# Patient Record
Sex: Female | Born: 1980 | Race: Black or African American | Hispanic: No | Marital: Single | Smoking: Current some day smoker
Health system: Southern US, Community
[De-identification: ages and names within clinical notes are randomized; demographics above are authoritative.]

## PROBLEM LIST (undated history)

## (undated) DIAGNOSIS — G629 Polyneuropathy, unspecified: Secondary | ICD-10-CM

## (undated) DIAGNOSIS — R51 Headache: Secondary | ICD-10-CM

## (undated) DIAGNOSIS — I509 Heart failure, unspecified: Secondary | ICD-10-CM

## (undated) DIAGNOSIS — F419 Anxiety disorder, unspecified: Secondary | ICD-10-CM

## (undated) DIAGNOSIS — D571 Sickle-cell disease without crisis: Secondary | ICD-10-CM

## (undated) DIAGNOSIS — I10 Essential (primary) hypertension: Secondary | ICD-10-CM

## (undated) DIAGNOSIS — J45909 Unspecified asthma, uncomplicated: Secondary | ICD-10-CM

## (undated) DIAGNOSIS — D649 Anemia, unspecified: Secondary | ICD-10-CM

## (undated) DIAGNOSIS — J302 Other seasonal allergic rhinitis: Secondary | ICD-10-CM

## (undated) DIAGNOSIS — I2510 Atherosclerotic heart disease of native coronary artery without angina pectoris: Secondary | ICD-10-CM

## (undated) DIAGNOSIS — K603 Anal fistula, unspecified: Secondary | ICD-10-CM

## (undated) DIAGNOSIS — K760 Fatty (change of) liver, not elsewhere classified: Secondary | ICD-10-CM

## (undated) DIAGNOSIS — K810 Acute cholecystitis: Secondary | ICD-10-CM

## (undated) DIAGNOSIS — G4730 Sleep apnea, unspecified: Secondary | ICD-10-CM

## (undated) DIAGNOSIS — T8859XA Other complications of anesthesia, initial encounter: Secondary | ICD-10-CM

## (undated) DIAGNOSIS — E010 Iodine-deficiency related diffuse (endemic) goiter: Secondary | ICD-10-CM

## (undated) DIAGNOSIS — G4733 Obstructive sleep apnea (adult) (pediatric): Secondary | ICD-10-CM

## (undated) DIAGNOSIS — R519 Headache, unspecified: Secondary | ICD-10-CM

## (undated) DIAGNOSIS — B009 Herpesviral infection, unspecified: Secondary | ICD-10-CM

## (undated) DIAGNOSIS — T4145XA Adverse effect of unspecified anesthetic, initial encounter: Secondary | ICD-10-CM

## (undated) DIAGNOSIS — K5732 Diverticulitis of large intestine without perforation or abscess without bleeding: Secondary | ICD-10-CM

## (undated) HISTORY — DX: Anemia, unspecified: D64.9

---

## 1999-06-10 ENCOUNTER — Emergency Department (HOSPITAL_COMMUNITY): Admission: EM | Admit: 1999-06-10 | Discharge: 1999-06-10 | Payer: Self-pay | Admitting: Emergency Medicine

## 1999-06-10 ENCOUNTER — Encounter: Payer: Self-pay | Admitting: Emergency Medicine

## 1999-06-12 ENCOUNTER — Emergency Department (HOSPITAL_COMMUNITY): Admission: EM | Admit: 1999-06-12 | Discharge: 1999-06-12 | Payer: Self-pay | Admitting: Emergency Medicine

## 2004-01-23 ENCOUNTER — Emergency Department (HOSPITAL_COMMUNITY): Admission: EM | Admit: 2004-01-23 | Discharge: 2004-01-23 | Payer: Self-pay | Admitting: Family Medicine

## 2004-04-26 ENCOUNTER — Encounter: Admission: RE | Admit: 2004-04-26 | Discharge: 2004-04-26 | Payer: Self-pay | Admitting: Family Medicine

## 2004-09-19 ENCOUNTER — Emergency Department (HOSPITAL_COMMUNITY): Admission: EM | Admit: 2004-09-19 | Discharge: 2004-09-20 | Payer: Self-pay

## 2004-10-02 ENCOUNTER — Emergency Department (HOSPITAL_COMMUNITY): Admission: EM | Admit: 2004-10-02 | Discharge: 2004-10-02 | Payer: Self-pay | Admitting: Emergency Medicine

## 2006-01-16 ENCOUNTER — Other Ambulatory Visit: Admission: RE | Admit: 2006-01-16 | Discharge: 2006-01-16 | Payer: Self-pay | Admitting: Gynecology

## 2007-01-21 ENCOUNTER — Other Ambulatory Visit: Admission: RE | Admit: 2007-01-21 | Discharge: 2007-01-21 | Payer: Self-pay | Admitting: Obstetrics and Gynecology

## 2008-02-22 ENCOUNTER — Other Ambulatory Visit: Admission: RE | Admit: 2008-02-22 | Discharge: 2008-02-22 | Payer: Self-pay | Admitting: Obstetrics and Gynecology

## 2008-12-16 ENCOUNTER — Encounter: Admission: RE | Admit: 2008-12-16 | Discharge: 2008-12-16 | Payer: Self-pay | Admitting: Family Medicine

## 2009-03-15 ENCOUNTER — Encounter (INDEPENDENT_AMBULATORY_CARE_PROVIDER_SITE_OTHER): Payer: Self-pay | Admitting: *Deleted

## 2009-03-22 ENCOUNTER — Ambulatory Visit: Payer: Self-pay | Admitting: Family Medicine

## 2009-03-22 DIAGNOSIS — M549 Dorsalgia, unspecified: Secondary | ICD-10-CM | POA: Insufficient documentation

## 2009-03-22 DIAGNOSIS — J45901 Unspecified asthma with (acute) exacerbation: Secondary | ICD-10-CM

## 2009-03-22 DIAGNOSIS — Z6841 Body Mass Index (BMI) 40.0 and over, adult: Secondary | ICD-10-CM

## 2009-03-22 HISTORY — DX: Morbid (severe) obesity due to excess calories: E66.01

## 2009-04-07 ENCOUNTER — Encounter: Payer: Self-pay | Admitting: Family Medicine

## 2009-05-17 ENCOUNTER — Ambulatory Visit: Payer: Self-pay | Admitting: Diagnostic Radiology

## 2009-05-17 ENCOUNTER — Emergency Department (HOSPITAL_BASED_OUTPATIENT_CLINIC_OR_DEPARTMENT_OTHER): Admission: EM | Admit: 2009-05-17 | Discharge: 2009-05-17 | Payer: Self-pay | Admitting: Emergency Medicine

## 2009-06-19 ENCOUNTER — Encounter: Payer: Self-pay | Admitting: Family Medicine

## 2009-06-19 ENCOUNTER — Other Ambulatory Visit: Admission: RE | Admit: 2009-06-19 | Discharge: 2009-06-19 | Payer: Self-pay | Admitting: Family Medicine

## 2009-06-19 ENCOUNTER — Ambulatory Visit: Payer: Self-pay | Admitting: Family Medicine

## 2009-06-20 ENCOUNTER — Telehealth (INDEPENDENT_AMBULATORY_CARE_PROVIDER_SITE_OTHER): Payer: Self-pay | Admitting: *Deleted

## 2009-06-20 LAB — CONVERTED CEMR LAB
ALT: 12 units/L (ref 0–35)
AST: 14 units/L (ref 0–37)
Alkaline Phosphatase: 64 units/L (ref 39–117)
Eosinophils Relative: 2.3 % (ref 0.0–5.0)
GFR calc non Af Amer: 151.96 mL/min (ref >60)
HCT: 34 % — ABNORMAL LOW (ref 36.0–46.0)
Hemoglobin: 11 g/dL — ABNORMAL LOW (ref 12.0–15.0)
LDL Cholesterol: 112 mg/dL — ABNORMAL HIGH (ref 0–99)
Lymphs Abs: 2.2 10*3/uL (ref 0.7–4.0)
Monocytes Relative: 9.2 % (ref 3.0–12.0)
Neutro Abs: 3.5 10*3/uL (ref 1.4–7.7)
Platelets: 392 10*3/uL (ref 150.0–400.0)
Potassium: 4 meq/L (ref 3.5–5.1)
Sodium: 140 meq/L (ref 135–145)
TSH: 1.17 microintl units/mL (ref 0.35–5.50)
Total Bilirubin: 0.6 mg/dL (ref 0.3–1.2)
VLDL: 9.4 mg/dL (ref 0.0–40.0)
WBC: 6.5 10*3/uL (ref 4.5–10.5)

## 2009-06-27 ENCOUNTER — Encounter (INDEPENDENT_AMBULATORY_CARE_PROVIDER_SITE_OTHER): Payer: Self-pay | Admitting: *Deleted

## 2009-07-25 ENCOUNTER — Telehealth (INDEPENDENT_AMBULATORY_CARE_PROVIDER_SITE_OTHER): Payer: Self-pay | Admitting: *Deleted

## 2009-08-03 ENCOUNTER — Telehealth (INDEPENDENT_AMBULATORY_CARE_PROVIDER_SITE_OTHER): Payer: Self-pay | Admitting: *Deleted

## 2009-09-23 HISTORY — PX: OTHER SURGICAL HISTORY: SHX169

## 2010-02-07 ENCOUNTER — Ambulatory Visit: Payer: Self-pay | Admitting: Family Medicine

## 2010-02-07 DIAGNOSIS — N912 Amenorrhea, unspecified: Secondary | ICD-10-CM

## 2010-02-08 ENCOUNTER — Telehealth (INDEPENDENT_AMBULATORY_CARE_PROVIDER_SITE_OTHER): Payer: Self-pay | Admitting: *Deleted

## 2010-02-16 ENCOUNTER — Emergency Department (HOSPITAL_COMMUNITY): Admission: EM | Admit: 2010-02-16 | Discharge: 2010-02-16 | Payer: Self-pay | Admitting: Emergency Medicine

## 2010-02-20 ENCOUNTER — Emergency Department (HOSPITAL_COMMUNITY): Admission: EM | Admit: 2010-02-20 | Discharge: 2010-02-20 | Payer: Self-pay | Admitting: Emergency Medicine

## 2010-03-13 ENCOUNTER — Ambulatory Visit (HOSPITAL_COMMUNITY): Admission: RE | Admit: 2010-03-13 | Discharge: 2010-03-13 | Payer: Self-pay | Admitting: Obstetrics

## 2010-05-11 ENCOUNTER — Ambulatory Visit (HOSPITAL_COMMUNITY): Admission: RE | Admit: 2010-05-11 | Discharge: 2010-05-11 | Payer: Self-pay | Admitting: Obstetrics

## 2010-06-08 ENCOUNTER — Ambulatory Visit (HOSPITAL_COMMUNITY): Admission: RE | Admit: 2010-06-08 | Discharge: 2010-06-08 | Payer: Self-pay | Admitting: Obstetrics

## 2010-06-19 ENCOUNTER — Inpatient Hospital Stay (HOSPITAL_COMMUNITY): Admission: AD | Admit: 2010-06-19 | Discharge: 2010-06-19 | Payer: Self-pay | Admitting: Obstetrics

## 2010-07-11 ENCOUNTER — Encounter: Payer: Self-pay | Admitting: Obstetrics & Gynecology

## 2010-07-11 ENCOUNTER — Inpatient Hospital Stay (HOSPITAL_COMMUNITY): Admission: AD | Admit: 2010-07-11 | Discharge: 2010-07-15 | Payer: Self-pay | Admitting: Obstetrics & Gynecology

## 2010-07-12 ENCOUNTER — Ambulatory Visit: Payer: Self-pay | Admitting: Cardiology

## 2010-07-12 ENCOUNTER — Encounter: Payer: Self-pay | Admitting: Pulmonary Disease

## 2010-07-12 ENCOUNTER — Encounter (INDEPENDENT_AMBULATORY_CARE_PROVIDER_SITE_OTHER): Payer: Self-pay | Admitting: Pulmonary Disease

## 2010-07-12 ENCOUNTER — Ambulatory Visit: Payer: Self-pay | Admitting: Pulmonary Disease

## 2010-07-13 ENCOUNTER — Other Ambulatory Visit: Payer: Self-pay | Admitting: Obstetrics & Gynecology

## 2010-08-02 ENCOUNTER — Telehealth: Payer: Self-pay | Admitting: Pulmonary Disease

## 2010-08-07 ENCOUNTER — Encounter: Payer: Self-pay | Admitting: Pulmonary Disease

## 2010-08-07 ENCOUNTER — Ambulatory Visit (HOSPITAL_BASED_OUTPATIENT_CLINIC_OR_DEPARTMENT_OTHER)
Admission: RE | Admit: 2010-08-07 | Discharge: 2010-08-07 | Payer: Self-pay | Source: Home / Self Care | Admitting: Pulmonary Disease

## 2010-08-10 ENCOUNTER — Ambulatory Visit: Payer: Self-pay | Admitting: Pulmonary Disease

## 2010-10-18 ENCOUNTER — Telehealth (INDEPENDENT_AMBULATORY_CARE_PROVIDER_SITE_OTHER): Payer: Self-pay | Admitting: *Deleted

## 2010-10-23 NOTE — Progress Notes (Signed)
Summary: prenatal vit  Phone Note Refill Request Call back at [phone] Message from:  Pharmacy on Feb 08, 2010 9:24 AM  Refills Requested: Medication #1:  CITRANATAL DHA 27-1 & 250 MG MISC 1 tab daily as directed.   Dosage confirmed as above?Dosage Confirmed   Notes: This med is on backorder till June, what would you like to change it to? [facility] on [address]  Next Appointment Scheduled: seen yesterday Initial call taken by: Elna Breslow,  Feb 08, 2010 9:25 AM  Follow-up for Phone Call        left message on machine ....Marland KitchenMarland KitchenMalachi Bonds  Feb 08, 2010 10:40 AM      Appended Document: prenatal vit    Phone Note Outgoing Call   Call placed by: Malachi Bonds,  Feb 08, 2010 12:12 PM Call placed to: Patient Summary of Call: left message on machine .....Marland KitchenMarland KitchenMalachi Bonds  Feb 08, 2010 12:18 PM   Follow-up for Phone Call        patient to see gyn.......Marland KitchenMalachi Bonds  Feb 14, 2010 2:17 PM

## 2010-10-23 NOTE — Assessment & Plan Note (Signed)
Summary: pregnacy test/cbs   Vital Signs:  Patient profile:   30 year old female LMP:     09/24/1999 Weight:      406 pounds O2 Sat:      98 % on Room air Pulse rate:   119 / minute  Vitals Entered By: Malachi Bonds (Feb 07, 2010 9:35 AM)  O2 Flow:  Room air  History of Present Illness: 30 yo woman here today for possible pregnancy.  never started [facility]- lost script but didn't notify us.  no period since 1/11.  (-) Upreg in Feb.  3 positive home tests in April.  did not call doctor nor start vitamins.  pt wants to keep pregnancy.  Preventive Screening-Counseling & Management  Alcohol-Tobacco     Smoking Status: quit < 6 months  Current Medications (verified): 1)  Proventil Hfa 108 (90 Base) Mcg/act Aers (Albuterol Sulfate) .... Use As Needed 2)  Advair Diskus 100-50 Mcg/dose Misc (Fluticasone-Salmeterol) .... Use Daily 3)  Ferrous Sulfate 325 (65 Fe) Mg Tabs (Ferrous Sulfate) .... Take One Tablet Two Times A Day 4)  Citranatal Dha 27-1 & 250 Mg Misc (Prenat W/o A-Fecbgl-Dss-Fa-Dha) .Marland Kitchen.. 1 Tab Daily As Directed  Allergies (verified): 1)  ! Penicillin  Past History:  Past Medical History: Last updated: 03/22/2009 Asthma Obesity  Social History: Last updated: 03/22/2009 lives w/ sister in a relationship, feels safe customer service for [employer]  Social History: Smoking Status:  quit < 6 months  Review of Systems      See HPI  Physical Exam  General:  morbidly obese Psych:  Cognition and judgment appear intact. Alert and cooperative with normal attention span and concentration. No apparent delusions, illusions, hallucinations   Impression & Recommendations:  Problem # 1:  AMENORRHEA (ICD-626.0) Assessment New pt informed of her + pregnancy test.  refer to OB for determination of gestational age and to set up prenatal care.  start vitamins.  reviewed list of things to avoid in pregnancy.  pt's questions answered. The following medications were removed from the  medication list:    Seasonique 0.15-0.03 &0.01 Mg Tabs (Levonorgest-eth estrad 91-day) .Marland Kitchen... 1 tab daily as directed.  disp 1 pack.  Orders: Obstetric Referral (Obstetric) Urine Pregnancy Test  (23762)  Complete Medication List: 1)  Proventil Hfa 108 (90 Base) Mcg/act Aers (Albuterol sulfate) .... Use as needed 2)  Advair Diskus 100-50 Mcg/dose Misc (Fluticasone-salmeterol) .... Use daily 3)  Ferrous Sulfate 325 (65 Fe) Mg Tabs (Ferrous sulfate) .... Take one tablet two times a day 4)  Citranatal Dha 27-1 & 250 Mg Misc (Prenat w/o a-fecbgl-dss-fa-dha) .Marland Kitchen.. 1 tab daily as directed  Patient Instructions: 1)  Take your prenatal vitamin daily 2)  AVOID alcohol, ibuprofen, aspirin, sushi, lunch meats 3)  Someone will call you with your OB appt 4)  Call with any questions or concerns 5)  CONGRATS!!! Prescriptions: PROVENTIL HFA 108 (90 BASE) MCG/ACT AERS (ALBUTEROL SULFATE) use as needed  #1 x 3   Entered and Authorized by:   Annye Asa MD   Signed by:   Annye Asa MD on 02/07/2010   Method used:   Electronically to        [facility]. [phone]* (retail)       [address] [place], [place]  [address]       Ph: [phone]       Fax: [phone]   RxID:   [id number] [place] DHA 27-1 & 250 [place] (  PRENAT W/O A-FECBGL-DSS-FA-DHA) 1 tab daily as directed  #90 x 3   Entered and Authorized by:   Annye Asa MD   Signed by:   Annye Asa MD on 02/07/2010   Method used:   Electronically to        [facility]. [phone]* (retail)       [address] [place], [place]  [address]       Ph: [phone]       Fax: [phone]   RxID:   [id number]   Laboratory Results   Urine Tests      Urine HCG: positive

## 2010-10-23 NOTE — Progress Notes (Signed)
Summary: nos appt  Phone Note Call from Patient   Caller: [email or web address]  Call For: alva Summary of Call: LMTCB x2 to rsc nos from 11/9. Initial call taken by: Netta Neat,  August 02, 2010 3:09 PM

## 2010-10-25 NOTE — Progress Notes (Signed)
Summary: refill  Phone Note Refill Request Message from:  Fax from Pharmacy on October 18, 2010 9:36 AM  Refills Requested: Medication #1:  VENTOLIN HFA INH W/DOS CTR 200 PUFFS USE AS DIRECTED [facility] [address] - fax [phone] - phone [phone]  Initial call taken by: Arbie [place],  October 18, 2010 9:40 AM    New/Updated Medications: VENTOLIN HFA 108 (90 BASE) MCG/ACT AERS (ALBUTEROL SULFATE) 2 puffs every 4 to 6 hours as needed Prescriptions: VENTOLIN HFA 108 (90 BASE) MCG/ACT AERS (ALBUTEROL SULFATE) 2 puffs every 4 to 6 hours as needed  #1 x 3   Entered by:   Malachi Bonds CMA   Authorized by:   Annye Asa MD   Signed by:   Malachi Bonds CMA on 10/18/2010   Method used:   Electronically to        [facility] (retail)       [address]       [place], [place]  [address]       Ph: [phone]       Fax: [phone]   RxID:   [id number]

## 2010-12-05 LAB — POCT I-STAT 3, ART BLOOD GAS (G3+)
Acid-Base Excess: 4 mmol/L — ABNORMAL HIGH (ref 0.0–2.0)
Acid-Base Excess: 7 mmol/L — ABNORMAL HIGH (ref 0.0–2.0)
Bicarbonate: 29.7 mEq/L — ABNORMAL HIGH (ref 20.0–24.0)
O2 Saturation: 91 %
O2 Saturation: 95 %
O2 Saturation: 96 %
O2 Saturation: 98 %
Patient temperature: 98.4
TCO2: 31 mmol/L (ref 0–100)
TCO2: 33 mmol/L (ref 0–100)
pCO2 arterial: 51.9 mmHg — ABNORMAL HIGH (ref 35.0–45.0)
pCO2 arterial: 67.3 mmHg (ref 35.0–45.0)
pCO2 arterial: 67.3 mmHg (ref 35.0–45.0)
pH, Arterial: 7.251 — ABNORMAL LOW (ref 7.350–7.400)
pH, Arterial: 7.268 — ABNORMAL LOW (ref 7.350–7.400)
pO2, Arterial: 105 mmHg — ABNORMAL HIGH (ref 80.0–100.0)
pO2, Arterial: 65 mmHg — ABNORMAL LOW (ref 80.0–100.0)
pO2, Arterial: 90 mmHg (ref 80.0–100.0)

## 2010-12-05 LAB — URINALYSIS, ROUTINE W REFLEX MICROSCOPIC
Glucose, UA: NEGATIVE mg/dL
Glucose, UA: NEGATIVE mg/dL
Ketones, ur: NEGATIVE mg/dL
Nitrite: NEGATIVE
Protein, ur: NEGATIVE mg/dL
Specific Gravity, Urine: 1.015 (ref 1.005–1.030)
Urobilinogen, UA: 0.2 mg/dL (ref 0.0–1.0)
pH: 7.5 (ref 5.0–8.0)

## 2010-12-05 LAB — BLOOD GAS, ARTERIAL
Acid-base deficit: 0.6 mmol/L (ref 0.0–2.0)
Acid-base deficit: 1.8 mmol/L (ref 0.0–2.0)
Bicarbonate: 26.1 mEq/L — ABNORMAL HIGH (ref 20.0–24.0)
Bicarbonate: 29.5 mEq/L — ABNORMAL HIGH (ref 20.0–24.0)
Delivery systems: POSITIVE
Drawn by: 30803
Drawn by: 308031
Drawn by: 308031
Expiratory PAP: 6
Expiratory PAP: 6
FIO2: 0.4 %
Inspiratory PAP: 16
Inspiratory PAP: 18
Inspiratory PAP: 18
O2 Content: 1 L/min
O2 Saturation: 100 %
O2 Saturation: 100 %
RATE: 30 resp/min
TCO2: 28.1 mmol/L (ref 0–100)
pCO2 arterial: 77.4 mmHg (ref 35.0–45.0)
pH, Arterial: 7.007 — CL (ref 7.350–7.400)
pH, Arterial: 7.166 — CL (ref 7.350–7.400)
pO2, Arterial: 108 mmHg — ABNORMAL HIGH (ref 80.0–100.0)
pO2, Arterial: 125 mmHg — ABNORMAL HIGH (ref 80.0–100.0)
pO2, Arterial: 188 mmHg — ABNORMAL HIGH (ref 80.0–100.0)

## 2010-12-05 LAB — BASIC METABOLIC PANEL
BUN: 3 mg/dL — ABNORMAL LOW (ref 6–23)
BUN: 4 mg/dL — ABNORMAL LOW (ref 6–23)
CO2: 29 mEq/L (ref 19–32)
CO2: 29 mEq/L (ref 19–32)
Calcium: 7.8 mg/dL — ABNORMAL LOW (ref 8.4–10.5)
Chloride: 104 mEq/L (ref 96–112)
Creatinine, Ser: 0.55 mg/dL (ref 0.4–1.2)
Creatinine, Ser: 0.64 mg/dL (ref 0.4–1.2)
GFR calc Af Amer: >60 mL/min (ref >60)
GFR calc non Af Amer: >60 mL/min (ref >60)
GFR calc non Af Amer: >60 mL/min (ref >60)
Glucose, Bld: 76 mg/dL (ref 70–99)
Glucose, Bld: 95 mg/dL (ref 70–99)
Potassium: 5 mEq/L (ref 3.5–5.1)
Potassium: 5 mEq/L (ref 3.5–5.1)
Potassium: 5.1 mEq/L (ref 3.5–5.1)
Sodium: 137 mEq/L (ref 135–145)

## 2010-12-05 LAB — URINE CULTURE
Colony Count: NO GROWTH
Culture  Setup Time: 201110200910
Culture: NO GROWTH

## 2010-12-05 LAB — CBC
HCT: 24.9 % — ABNORMAL LOW (ref 36.0–46.0)
HCT: 28.6 % — ABNORMAL LOW (ref 36.0–46.0)
Hemoglobin: 7.9 g/dL — ABNORMAL LOW (ref 12.0–15.0)
Hemoglobin: 8.7 g/dL — ABNORMAL LOW (ref 12.0–15.0)
MCHC: 28.9 g/dL — ABNORMAL LOW (ref 30.0–36.0)
MCV: 67.2 fL — ABNORMAL LOW (ref 78.0–100.0)
Platelets: 332 10*3/uL (ref 150–400)
Platelets: 354 10*3/uL (ref 150–400)
Platelets: 366 10*3/uL (ref 150–400)
RBC: 4.08 MIL/uL (ref 3.87–5.11)
RDW: 18.5 % — ABNORMAL HIGH (ref 11.5–15.5)
RDW: 19.3 % — ABNORMAL HIGH (ref 11.5–15.5)
RDW: 19.4 % — ABNORMAL HIGH (ref 11.5–15.5)
WBC: 12.1 10*3/uL — ABNORMAL HIGH (ref 4.0–10.5)
WBC: 16.4 10*3/uL — ABNORMAL HIGH (ref 4.0–10.5)
WBC: 9.9 10*3/uL (ref 4.0–10.5)

## 2010-12-05 LAB — CARDIAC PANEL(CRET KIN+CKTOT+MB+TROPI)
CK, MB: 1.8 ng/mL (ref 0.3–4.0)
CK, MB: 2.6 ng/mL (ref 0.3–4.0)
Relative Index: 3.4 — ABNORMAL HIGH (ref 0.0–2.5)
Relative Index: INVALID (ref 0.0–2.5)
Relative Index: INVALID (ref 0.0–2.5)
Total CK: 78 U/L (ref 7–177)
Troponin I: <0.01 ng/mL (ref 0.00–0.06)

## 2010-12-05 LAB — MAGNESIUM
Magnesium: 3.4 mg/dL — ABNORMAL HIGH (ref 1.5–2.5)
Magnesium: 3.8 mg/dL — ABNORMAL HIGH (ref 1.5–2.5)
Magnesium: 3.9 mg/dL — ABNORMAL HIGH (ref 1.5–2.5)
Magnesium: 3.9 mg/dL — ABNORMAL HIGH (ref 1.5–2.5)
Magnesium: 4.1 mg/dL — ABNORMAL HIGH (ref 1.5–2.5)
Magnesium: 4.5 mg/dL — ABNORMAL HIGH (ref 1.5–2.5)

## 2010-12-05 LAB — URINE MICROSCOPIC-ADD ON

## 2010-12-05 LAB — PHOSPHORUS
Phosphorus: 4.7 mg/dL — ABNORMAL HIGH (ref 2.3–4.6)
Phosphorus: 5.1 mg/dL — ABNORMAL HIGH (ref 2.3–4.6)

## 2010-12-05 LAB — COMPREHENSIVE METABOLIC PANEL
Albumin: 2.2 g/dL — ABNORMAL LOW (ref 3.5–5.2)
Alkaline Phosphatase: 111 U/L (ref 39–117)
BUN: 4 mg/dL — ABNORMAL LOW (ref 6–23)
Potassium: 4.1 mEq/L (ref 3.5–5.1)
Sodium: 135 mEq/L (ref 135–145)
Total Protein: 6.3 g/dL (ref 6.0–8.3)

## 2010-12-05 LAB — URIC ACID: Uric Acid, Serum: 6.4 mg/dL (ref 2.4–7.0)

## 2010-12-05 LAB — CULTURE, BLOOD (ROUTINE X 2)
Culture  Setup Time: 201110201110
Culture: NO GROWTH

## 2010-12-05 LAB — MRSA PCR SCREENING: MRSA by PCR: NEGATIVE

## 2010-12-05 LAB — GLUCOSE, CAPILLARY
Glucose-Capillary: 107 mg/dL — ABNORMAL HIGH (ref 70–99)
Glucose-Capillary: 110 mg/dL — ABNORMAL HIGH (ref 70–99)
Glucose-Capillary: 120 mg/dL — ABNORMAL HIGH (ref 70–99)
Glucose-Capillary: 82 mg/dL (ref 70–99)

## 2010-12-05 LAB — BRAIN NATRIURETIC PEPTIDE: Pro B Natriuretic peptide (BNP): 112 pg/mL — ABNORMAL HIGH (ref 0.0–100.0)

## 2010-12-05 LAB — PROTIME-INR
INR: 1.17 (ref 0.00–1.49)
Prothrombin Time: 15.1 seconds (ref 11.6–15.2)

## 2010-12-05 LAB — STREP PNEUMONIAE URINARY ANTIGEN: Strep Pneumo Urinary Antigen: NEGATIVE

## 2010-12-05 LAB — HEMOGLOBIN A1C: Hgb A1c MFr Bld: 5.5 % (ref <5.7)

## 2010-12-06 LAB — COMPREHENSIVE METABOLIC PANEL
ALT: 10 U/L (ref 0–35)
AST: 13 U/L (ref 0–37)
Albumin: 2.3 g/dL — ABNORMAL LOW (ref 3.5–5.2)
CO2: 28 mEq/L (ref 19–32)
Calcium: 8.9 mg/dL (ref 8.4–10.5)
Creatinine, Ser: 0.42 mg/dL (ref 0.4–1.2)
GFR calc Af Amer: >60 mL/min (ref >60)
GFR calc non Af Amer: >60 mL/min (ref >60)
Sodium: 138 mEq/L (ref 135–145)
Total Protein: 6.5 g/dL (ref 6.0–8.3)

## 2010-12-06 LAB — CBC
Hemoglobin: 8.5 g/dL — ABNORMAL LOW (ref 12.0–15.0)
MCH: 21.3 pg — ABNORMAL LOW (ref 26.0–34.0)
MCHC: 31.4 g/dL (ref 30.0–36.0)
Platelets: 423 10*3/uL — ABNORMAL HIGH (ref 150–400)
RDW: 18.5 % — ABNORMAL HIGH (ref 11.5–15.5)

## 2010-12-10 LAB — URINE MICROSCOPIC-ADD ON

## 2010-12-10 LAB — URINALYSIS, ROUTINE W REFLEX MICROSCOPIC
Bilirubin Urine: NEGATIVE
Glucose, UA: NEGATIVE mg/dL
Glucose, UA: NEGATIVE mg/dL
Hgb urine dipstick: NEGATIVE
Hgb urine dipstick: NEGATIVE
Protein, ur: NEGATIVE mg/dL
Protein, ur: NEGATIVE mg/dL
Specific Gravity, Urine: 1.018 (ref 1.005–1.030)
pH: 7.5 (ref 5.0–8.0)

## 2010-12-10 LAB — POCT PREGNANCY, URINE: Preg Test, Ur: POSITIVE

## 2010-12-10 LAB — GC/CHLAMYDIA PROBE AMP, GENITAL: GC Probe Amp, Genital: NEGATIVE

## 2010-12-29 LAB — POCT CARDIAC MARKERS

## 2011-02-08 NOTE — Group Therapy Note (Signed)
[name], [name] NO.:  [id number]   MEDICAL RECORD NO.:  [id number]                   PATIENT TYPE:  OUT   LOCATION:  [facility]                           FACILITY:  [facility]   PHYSICIAN:  Andrew Au, MD                     DATE OF BIRTH:  1981/06/09   DATE OF SERVICE:  04/26/2004                                    CLINIC NOTE   CHIEF COMPLAINT:  Heavy bleeding.   HISTORY OF PRESENT ILLNESS:  Dominique [name] is a 30 year old patient with history  of irregular menstrual bleeding who comes in today with continuous bleeding  x2 months.  LMP began February 22, 2004, continued.  States this happened  previously in 1999 and 2001 relieved with OCPs after 2001, but stopped her  N[place]ette OCP August of 2004.  Has since then irregular q.2-3 month periods  four to five days of regular bleeding.  Had a period in March of 2005 and  again in June of 2005 and has not stopped.  Is having moderate bleeding, not  heavy or light.  Three pads per day, red.  No pain.  States that she is  tired with decreased energy, but not too bad.  She has history of Trich.  No abnormal Pap smears.  Last Pap smear in April of 2005.  Other than  asthma, no surgical or medical history.   OBJECTIVE:  VITAL SIGNS:  Noted.  GENERAL:  This is an obese black female, NAD, pleasant.  CARDIOVASCULAR:  RR.  LUNGS:  CTAB.  ABDOMEN:  Positive BS, soft, NT, ND.  Unable to appreciate fundal height.   ASSESSMENT/PLAN:  A 30 year old morbidly obese patient.  1. Menometrorrhagia likely secondary to polycystic ovarian syndrome.  Will     cycle on birth control pills in order to regulate bleeding cycles.  Will     start Yasmin.  The patient in agreement with this plan.  Also, check CBC     to rule out significant anemia with her recent bleeding.  Will also need     to rule out glucose intolerance with polycystic ovarian syndrome.  Will     schedule for a three-hour GTT.  The patient states she cannot pay for  this until end of August.  Will have a follow-up in three weeks.  Post     test will start empirically on Metformin 500 mg b.i.d. and instructed to     titrate up for gastrointestinal symptoms.  If three-hour GTT is normal     will continue to follow in the clinic.  If abnormal, will encourage     patient to follow up at [facility] or internal medicine physicians.  2. Asthma.  Also given refills albuterol and Advair as patient is from out     of town and does not have a primary physician in this area.     Dominique [name]  Dominique Lai, MD                        Andrew Au, MD    JS/MEDQ  D:  04/26/2004  T:  04/27/2004  Job:  [id number]

## 2011-10-24 ENCOUNTER — Other Ambulatory Visit: Payer: Self-pay | Admitting: Family Medicine

## 2011-10-24 NOTE — Telephone Encounter (Signed)
Pt noted last OV 02-07-2010 tried to contact pt both numbers disconnected in chart, called pharmacy per noted this was incoming fax and advised pt needs to be seen before anymore medications can be given, noted number given by pharmacy [phone] states a different person vm, however left message to notify office if the person in contact was indeed Dominique [name], Letter has been mailed to pt address noted in the chart to advise they are overdue for cpe/ov/labs and the pt needs to contact office to set up appt

## 2013-02-06 ENCOUNTER — Emergency Department (HOSPITAL_COMMUNITY): Payer: Self-pay

## 2013-02-06 ENCOUNTER — Emergency Department (HOSPITAL_COMMUNITY)
Admission: EM | Admit: 2013-02-06 | Discharge: 2013-02-06 | Disposition: A | Discharge status: Home / Self Care | Payer: Self-pay | Attending: Emergency Medicine | Admitting: Emergency Medicine

## 2013-02-06 ENCOUNTER — Encounter (HOSPITAL_COMMUNITY): Payer: Self-pay | Admitting: *Deleted

## 2013-02-06 DIAGNOSIS — R0602 Shortness of breath: Secondary | ICD-10-CM | POA: Insufficient documentation

## 2013-02-06 DIAGNOSIS — Z880 Allergy status to penicillin: Secondary | ICD-10-CM | POA: Insufficient documentation

## 2013-02-06 DIAGNOSIS — E669 Obesity, unspecified: Secondary | ICD-10-CM | POA: Insufficient documentation

## 2013-02-06 DIAGNOSIS — J45901 Unspecified asthma with (acute) exacerbation: Secondary | ICD-10-CM | POA: Insufficient documentation

## 2013-02-06 DIAGNOSIS — Z8669 Personal history of other diseases of the nervous system and sense organs: Secondary | ICD-10-CM | POA: Insufficient documentation

## 2013-02-06 DIAGNOSIS — Z79899 Other long term (current) drug therapy: Secondary | ICD-10-CM | POA: Insufficient documentation

## 2013-02-06 DIAGNOSIS — R110 Nausea: Secondary | ICD-10-CM | POA: Insufficient documentation

## 2013-02-06 DIAGNOSIS — Z3202 Encounter for pregnancy test, result negative: Secondary | ICD-10-CM | POA: Insufficient documentation

## 2013-02-06 HISTORY — DX: Sleep apnea, unspecified: G47.30

## 2013-02-06 HISTORY — DX: Unspecified asthma, uncomplicated: J45.909

## 2013-02-06 LAB — CBC WITH DIFFERENTIAL/PLATELET
Basophils Relative: 1 % (ref 0–1)
Eosinophils Absolute: 0.9 10*3/uL — ABNORMAL HIGH (ref 0.0–0.7)
Eosinophils Relative: 10 % — ABNORMAL HIGH (ref 0–5)
HCT: 32.7 % — ABNORMAL LOW (ref 36.0–46.0)
Hemoglobin: 8.9 g/dL — ABNORMAL LOW (ref 12.0–15.0)
Lymphocytes Relative: 31 % (ref 12–46)
MCH: 16.5 pg — ABNORMAL LOW (ref 26.0–34.0)
MCHC: 27.2 g/dL — ABNORMAL LOW (ref 30.0–36.0)
Neutro Abs: 4.1 10*3/uL (ref 1.7–7.7)
Neutrophils Relative %: 49 % (ref 43–77)
RBC: 5.4 MIL/uL — ABNORMAL HIGH (ref 3.87–5.11)

## 2013-02-06 LAB — COMPREHENSIVE METABOLIC PANEL
ALT: 13 U/L (ref 0–35)
AST: 14 U/L (ref 0–37)
Alkaline Phosphatase: 67 U/L (ref 39–117)
CO2: 29 mEq/L (ref 19–32)
Calcium: 9.3 mg/dL (ref 8.4–10.5)
Chloride: 99 mEq/L (ref 96–112)
GFR calc Af Amer: >90 mL/min (ref >90)
GFR calc non Af Amer: >90 mL/min (ref >90)
Glucose, Bld: 97 mg/dL (ref 70–99)
Sodium: 136 mEq/L (ref 135–145)
Total Bilirubin: 0.4 mg/dL (ref 0.3–1.2)

## 2013-02-06 LAB — D-DIMER, QUANTITATIVE: D-Dimer, Quant: <0.27 ug/mL-FEU (ref 0.00–0.48)

## 2013-02-06 MED ORDER — ALBUTEROL SULFATE (5 MG/ML) 0.5% IN NEBU
5.0000 mg | INHALATION_SOLUTION | Freq: Once | RESPIRATORY_TRACT | Status: AC
  Administered 2013-02-06: 5 mg via RESPIRATORY_TRACT
  Filled 2013-02-06: qty 1

## 2013-02-06 NOTE — ED Notes (Signed)
Pt c/o of mid upper abd pain w/ deep breathing and eating, nausea when eating X 2 weeks. Denies cough.

## 2013-02-06 NOTE — ED Provider Notes (Addendum)
History     CSN: [id number]  Arrival date & time 02/06/13  7829   First MD Initiated Contact with Patient 02/06/13 [phone]      Chief Complaint  Patient presents with  . Shortness of Breath    (Consider location/radiation/quality/duration/timing/severity/associated sxs/prior treatment) The history is provided by the patient.  Dominique [name] is a 32 y.o. female hx of asthma, sleep apnea here presenting with occasional shortness of breath. Shortness of breath over the last 2 weeks. Sometimes worse with deep breathing. Denies shortness of breath on exertion or chest pain. Denies cough or fevers. She also has some nausea when she denies any vomiting. She has history of obesity but no cardiac history in the past and no history of blood clots or recent travels.   Past Medical History  Diagnosis Date  . Asthma   . Sleep apnea     Past Surgical History  Procedure Laterality Date  . C section 2011      No family history on file.  History  Substance Use Topics  . Smoking status: Never Smoker   . Smokeless tobacco: Not on file  . Alcohol Use: No    OB History   Grav Para Term Preterm Abortions TAB SAB Ect Mult Living                  Review of Systems  Respiratory: Positive for shortness of breath.   All other systems reviewed and are negative.    Allergies  Penicillins  Home Medications   Current Outpatient Rx  Name  Route  Sig  Dispense  Refill  . albuterol (PROVENTIL HFA;VENTOLIN HFA) 108 (90 BASE) MCG/ACT inhaler   Inhalation   Inhale 2 puffs into the lungs every 6 (six) hours as needed for wheezing or shortness of breath.         . Budesonide (PULMICORT IN)   Nebulization   Take 1 vial by nebulization 2 (two) times daily. No strength known         . naproxen sodium (ANAPROX) 220 MG tablet   Oral   Take 660 mg by mouth daily as needed (as needed for pain or headache).           BP 149/67  Pulse 94  Temp(Src) 97.7 F (36.5 C)  Resp 17  SpO2 100%   LMP 01/05/2013  Physical Exam  Nursing note and vitals reviewed. Constitutional: She is oriented to person, place, and time. She appears well-developed and well-nourished.  Obese, comfortable   HENT:  Head: Normocephalic.  Mouth/Throat: Oropharynx is clear and moist.  Eyes: Conjunctivae are normal. Pupils are equal, round, and reactive to light.  Neck: Normal range of motion. Neck supple.  Cardiovascular: Normal rate, regular rhythm and normal heart sounds.   Pulmonary/Chest: Effort normal.  ? Minimal wheezing, difficult to examine due to body habitus   Abdominal: Soft. Bowel sounds are normal. She exhibits no distension. There is no tenderness. There is no rebound and no guarding.  Musculoskeletal: Normal range of motion.  No edema or calf tenderness   Neurological: She is alert and oriented to person, place, and time.  Skin: Skin is warm and dry.  Psychiatric: She has a normal mood and affect. Her behavior is normal. Judgment and thought content normal.    ED Course  Procedures (including critical care time)  Labs Reviewed  CBC WITH DIFFERENTIAL - Abnormal; Notable for the following:    RBC 5.40 (*)    Hemoglobin 8.9 (*)  HCT 32.7 (*)    MCV 60.6 (*)    MCH 16.5 (*)    MCHC 27.2 (*)    RDW 20.6 (*)    Platelets 409 (*)    Eosinophils Relative 10 (*)    Eosinophils Absolute 0.9 (*)    All other components within normal limits  D-DIMER, QUANTITATIVE  COMPREHENSIVE METABOLIC PANEL  LIPASE, BLOOD  PREGNANCY, URINE  TROPONIN I   Dg Chest 2 View  02/06/2013   *RADIOLOGY REPORT*  Clinical Data: Short of breath  CHEST - 2 VIEW  Comparison: 07/13/2010  Findings: Mild cardiomegaly.  Low volumes.  Stable tracheal deviation to the left. Thyroid enlargement is not excluded.  Mild vascular congestion without interstitial edema.  No pneumothorax. No pleural effusion.  IMPRESSION: Vascular congestion without interstitial edema.  Otherwise stable.   Original Report Authenticated By:  Marybelle Killings, M.D.     No diagnosis found.   Date: 02/06/2013  Rate: 85  Rhythm: normal sinus rhythm  QRS Axis: left  Intervals: normal  ST/T Wave abnormalities: nonspecific ST changes  Conduction Disutrbances:none  Narrative Interpretation:   Old EKG Reviewed: unchanged    MDM  Dominique [name] is a 32 y.o. female here with SOB. ? Wheezing so will give albuterol neb empirically. No need for steroids currently. Will get d-dimer given patient low risk for PE but is SOB. Will get CXR and labs and trop x 1. Will also need to get lipase given nausea. Not concerned for cholecystitis given nontender abdomen.   9:48 AM Labs including d-dimer and trop nl. CXR showed mild vascular congestion. She doesn't appear in heart failure. I think she may have some congestion from mild allergies and body habitus. Recommend claritin and albuterol prn and outpatient f/u. Return precautions given.          Wandra Arthurs, MD 02/06/13 6219  Wandra Arthurs, MD 02/06/13 [phone]

## 2013-03-12 ENCOUNTER — Emergency Department (HOSPITAL_COMMUNITY)
Admission: EM | Admit: 2013-03-12 | Discharge: 2013-03-12 | Disposition: A | Discharge status: Home / Self Care | Payer: Self-pay | Attending: Emergency Medicine | Admitting: Emergency Medicine

## 2013-03-12 ENCOUNTER — Emergency Department (HOSPITAL_COMMUNITY): Payer: Self-pay

## 2013-03-12 ENCOUNTER — Encounter (HOSPITAL_COMMUNITY): Payer: Self-pay

## 2013-03-12 DIAGNOSIS — J45909 Unspecified asthma, uncomplicated: Secondary | ICD-10-CM | POA: Insufficient documentation

## 2013-03-12 DIAGNOSIS — Z3202 Encounter for pregnancy test, result negative: Secondary | ICD-10-CM | POA: Insufficient documentation

## 2013-03-12 DIAGNOSIS — Z8669 Personal history of other diseases of the nervous system and sense organs: Secondary | ICD-10-CM | POA: Insufficient documentation

## 2013-03-12 DIAGNOSIS — Z880 Allergy status to penicillin: Secondary | ICD-10-CM | POA: Insufficient documentation

## 2013-03-12 DIAGNOSIS — M549 Dorsalgia, unspecified: Secondary | ICD-10-CM | POA: Insufficient documentation

## 2013-03-12 DIAGNOSIS — Z79899 Other long term (current) drug therapy: Secondary | ICD-10-CM | POA: Insufficient documentation

## 2013-03-12 DIAGNOSIS — R109 Unspecified abdominal pain: Secondary | ICD-10-CM | POA: Insufficient documentation

## 2013-03-12 LAB — CBC WITH DIFFERENTIAL/PLATELET
Basophils Absolute: 0.1 10*3/uL (ref 0.0–0.1)
Basophils Relative: 1 % (ref 0–1)
Eosinophils Absolute: 0.6 10*3/uL (ref 0.0–0.7)
Hemoglobin: 8.3 g/dL — ABNORMAL LOW (ref 12.0–15.0)
MCH: 16.7 pg — ABNORMAL LOW (ref 26.0–34.0)
MCHC: 27.4 g/dL — ABNORMAL LOW (ref 30.0–36.0)
Monocytes Absolute: 0.7 10*3/uL (ref 0.1–1.0)
Neutro Abs: 3.6 10*3/uL (ref 1.7–7.7)
Neutrophils Relative %: 50 % (ref 43–77)
RDW: 21 % — ABNORMAL HIGH (ref 11.5–15.5)

## 2013-03-12 LAB — URINALYSIS, ROUTINE W REFLEX MICROSCOPIC
Glucose, UA: NEGATIVE mg/dL
Ketones, ur: NEGATIVE mg/dL
Leukocytes, UA: NEGATIVE
Protein, ur: NEGATIVE mg/dL
Urobilinogen, UA: 1 mg/dL (ref 0.0–1.0)

## 2013-03-12 LAB — COMPREHENSIVE METABOLIC PANEL
AST: 17 U/L (ref 0–37)
BUN: 9 mg/dL (ref 6–23)
CO2: 28 mEq/L (ref 19–32)
Calcium: 9.2 mg/dL (ref 8.4–10.5)
Chloride: 102 mEq/L (ref 96–112)
Creatinine, Ser: 0.49 mg/dL — ABNORMAL LOW (ref 0.50–1.10)
GFR calc Af Amer: >90 mL/min (ref >90)
GFR calc non Af Amer: >90 mL/min (ref >90)
Glucose, Bld: 107 mg/dL — ABNORMAL HIGH (ref 70–99)
Total Bilirubin: 0.3 mg/dL (ref 0.3–1.2)

## 2013-03-12 LAB — LIPASE, BLOOD: Lipase: 20 U/L (ref 11–59)

## 2013-03-12 MED ORDER — GI COCKTAIL ~~LOC~~
30.0000 mL | Freq: Once | ORAL | Status: AC
  Administered 2013-03-12: 30 mL via ORAL
  Filled 2013-03-12: qty 30

## 2013-03-12 MED ORDER — FAMOTIDINE 20 MG PO TABS
40.0000 mg | ORAL_TABLET | Freq: Once | ORAL | Status: AC
  Administered 2013-03-12: 40 mg via ORAL
  Filled 2013-03-12: qty 2

## 2013-03-12 NOTE — ED Notes (Signed)
Pt reports pain to the epigastric region of abdomen. Pain comes and goes. Pt denies having any pain after eating or any changes in her eating habits. Pt states that she has taken Goodys to help with pain. Pt denies changes in bowel pattern with LBM yesterday.

## 2013-03-12 NOTE — ED Notes (Signed)
Phlebotomy at bedside.

## 2013-03-12 NOTE — ED Notes (Addendum)
Mini lab and RN unable get labs. Charge Rn was able to get blood work.

## 2013-03-12 NOTE — ED Notes (Signed)
Lab called, tube of blood hemolyzed. Phlebotomy to come draw replacement tube.

## 2013-03-12 NOTE — ED Notes (Signed)
MD at bedside. 

## 2013-03-12 NOTE — ED Notes (Signed)
Patient transported to X-ray 

## 2013-03-12 NOTE — ED Provider Notes (Signed)
History     CSN: [id number]  Arrival date & time 03/12/13  1607   First MD Initiated Contact with Patient 03/12/13 [phone]      Chief Complaint  Patient presents with  . Abdominal Pain     HPI Pt was seen at 0715.   Per pt, c/o gradual onset and persistence of multiple intermittent episodes of upper abd "pain" for the past 1 month. Describes the abd pain as "squeezing," with occasional radiation into her mid-back. Has not consistently been associated with food intake. Has been taking Goody's with intermittent relief. Denies N/V/D, no fevers, no back pain, no rash, no CP/SOB, no black or blood in stools or emesis.       Past Medical History  Diagnosis Date  . Asthma   . Sleep apnea     Past Surgical History  Procedure Laterality Date  . C section 2011       History  Substance Use Topics  . Smoking status: Never Smoker   . Smokeless tobacco: Not on file  . Alcohol Use: No     Review of Systems ROS: Statement: All systems negative except as marked or noted in the HPI; Constitutional: Negative for fever and chills. ; ; Eyes: Negative for eye pain, redness and discharge. ; ; ENMT: Negative for ear pain, hoarseness, nasal congestion, sinus pressure and sore throat. ; ; Cardiovascular: Negative for chest pain, palpitations, diaphoresis, dyspnea and peripheral edema. ; ; Respiratory: Negative for cough, wheezing and stridor. ; ; Gastrointestinal: +abd pain. Negative for nausea, vomiting, diarrhea, blood in stool, hematemesis, jaundice and rectal bleeding. . ; ; Genitourinary: Negative for dysuria, flank pain and hematuria. ; ; Musculoskeletal: Negative for back pain and neck pain. Negative for swelling and trauma.; ; Skin: Negative for pruritus, rash, abrasions, blisters, bruising and skin lesion.; ; Neuro: Negative for headache, lightheadedness and neck stiffness. Negative for weakness, altered level of consciousness , altered mental status, extremity weakness, paresthesias, involuntary  movement, seizure and syncope.       Allergies  Penicillins  Home Medications   Current Outpatient Rx  Name  Route  Sig  Dispense  Refill  . albuterol (PROVENTIL HFA;VENTOLIN HFA) 108 (90 BASE) MCG/ACT inhaler   Inhalation   Inhale 2 puffs into the lungs every 6 (six) hours as needed for wheezing or shortness of breath.         . Aspirin-Acetaminophen-Caffeine (GOODY HEADACHE PO)   Oral   Take 1 packet by mouth every 6 (six) hours as needed (pain).         . Budesonide (PULMICORT IN)   Nebulization   Take 1 vial by nebulization 2 (two) times daily. No strength known         . naproxen sodium (ANAPROX) 220 MG tablet   Oral   Take 660 mg by mouth daily as needed (as needed for pain or headache).           BP 133/56  Pulse 105  Temp(Src) 98.2 F (36.8 C) (Oral)  Resp 18  SpO2 95%  LMP 02/27/2013  Physical Exam 0720: Physical examination:  Nursing notes reviewed; Vital signs and O2 SAT reviewed;  Constitutional: Well developed, Well nourished, Well hydrated, In no acute distress; Head:  Normocephalic, atraumatic; Eyes: EOMI, PERRL, No scleral icterus; ENMT: Mouth and pharynx normal, Mucous membranes moist; Neck: Supple, Full range of motion, No lymphadenopathy; Cardiovascular: Regular rate and rhythm, No gallop; Respiratory: Breath sounds clear & equal bilaterally, No rales, rhonchi, wheezes.  Speaking full sentences with ease, Normal respiratory effort/excursion; Chest: Nontender, Movement normal; Abdomen: Soft, Morbidly obese. Nontender, Nondistended, Normal bowel sounds; Genitourinary: No CVA tenderness; Extremities: Pulses normal, No tenderness, No edema, No calf edema or asymmetry.; Neuro: AA&Ox3, Major CN grossly intact.  Speech clear. No gross focal motor or sensory deficits in extremities.; Skin: Color normal, Warm, Dry.   ED Course  Procedures      MDM  MDM Reviewed: previous chart, nursing note and vitals Reviewed previous: labs Interpretation: labs,  x-ray and ultrasound     Results for orders placed during the hospital encounter of 03/12/13  COMPREHENSIVE METABOLIC PANEL      Result Value Range   Sodium 137  135 - 145 mEq/L   Potassium 4.4  3.5 - 5.1 mEq/L   Chloride 102  96 - 112 mEq/L   CO2 28  19 - 32 mEq/L   Glucose, Bld 107 (*) 70 - 99 mg/dL   BUN 9  6 - 23 mg/dL   Creatinine, Ser 0.49 (*) 0.50 - 1.10 mg/dL   Calcium 9.2  8.4 - 10.5 mg/dL   Total Protein 7.7  6.0 - 8.3 g/dL   Albumin 3.3 (*) 3.5 - 5.2 g/dL   AST 17  0 - 37 U/L   ALT 12  0 - 35 U/L   Alkaline Phosphatase 56  39 - 117 U/L   Total Bilirubin 0.3  0.3 - 1.2 mg/dL   GFR calc non Af Amer >90  >90 mL/min   GFR calc Af Amer >90  >90 mL/min  LIPASE, BLOOD      Result Value Range   Lipase 20  11 - 59 U/L  URINALYSIS, ROUTINE W REFLEX MICROSCOPIC      Result Value Range   Color, Urine YELLOW  YELLOW   APPearance HAZY (*) CLEAR   Specific Gravity, Urine 1.021  1.005 - 1.030   pH 5.5  5.0 - 8.0   Glucose, UA NEGATIVE  NEGATIVE mg/dL   Hgb urine dipstick NEGATIVE  NEGATIVE   Bilirubin Urine NEGATIVE  NEGATIVE   Ketones, ur NEGATIVE  NEGATIVE mg/dL   Protein, ur NEGATIVE  NEGATIVE mg/dL   Urobilinogen, UA 1.0  0.0 - 1.0 mg/dL   Nitrite NEGATIVE  NEGATIVE   Leukocytes, UA NEGATIVE  NEGATIVE  PREGNANCY, URINE      Result Value Range   Preg Test, Ur NEGATIVE  NEGATIVE  CBC WITH DIFFERENTIAL      Result Value Range   WBC 7.3  4.0 - 10.5 K/uL   RBC 4.98  3.87 - 5.11 MIL/uL   Hemoglobin 8.3 (*) 12.0 - 15.0 g/dL   HCT 30.3 (*) 36.0 - 46.0 %   MCV 60.8 (*) 78.0 - 100.0 fL   MCH 16.7 (*) 26.0 - 34.0 pg   MCHC 27.4 (*) 30.0 - 36.0 g/dL   RDW 21.0 (*) 11.5 - 15.5 %   Platelets    150 - 400 K/uL   Value: PLATELET CLUMPS NOTED ON SMEAR, COUNT APPEARS ADEQUATE   Neutrophils Relative % 50  43 - 77 %   Lymphocytes Relative 31  12 - 46 %   Monocytes Relative 10  3 - 12 %   Eosinophils Relative 8 (*) 0 - 5 %   Basophils Relative 1  0 - 1 %   Neutro Abs 3.6  1.7 -  7.7 K/uL   Lymphs Abs 2.3  0.7 - 4.0 K/uL   Monocytes Absolute 0.7  0.1 - 1.0  K/uL   Eosinophils Absolute 0.6  0.0 - 0.7 K/uL   Basophils Absolute 0.1  0.0 - 0.1 K/uL   RBC Morphology POLYCHROMASIA PRESENT     US Abdomen Complete 03/12/2013   *RADIOLOGY REPORT*  Clinical Data:  Upper abdominal pain.  ABDOMEN ULTRASOUND  Technique:  Complete abdominal ultrasound examination was performed including evaluation of the liver, gallbladder, bile ducts, pancreas, kidneys, spleen, IVC, and abdominal aorta.  Comparison:  None  Findings:  Gallbladder:  No shadowing gallstones or echogenic sludge.  No gallbladder wall thickening or pericholecystic fluid.  Negative sonographic Murphy's sign according to the ultrasound technologist.  Common Bile Duct:  Normal caliber of 4 mm.  Liver:  The liver shows mildly increased echogenicity and heterogeneity of echotexture, likely reflecting hepatic steatosis. No focal lesion is identified in the liver.  IVC:  Patent throughout its visualized course in the abdomen.  Pancreas:  Although the pancreas is difficult to visualize in its entirety, no focal pancreatic abnormality is identified.  Spleen:  The spleen is of normal echotexture and size.  Kidneys:  The right kidney measures 12.3 cm and the left kidney 13.8 cm.  Both have a normal sonographic appearance with no evidence of hydronephrosis or focal lesion.  Abdominal Aorta:  Normal caliber abdominal aorta.  IMPRESSION: Probable underlying hepatic steatosis.  Otherwise normal abdominal ultrasound.   Original Report Authenticated By: Aletta Edouard, M.D.   Dg Abd Acute W/chest 03/12/2013   *RADIOLOGY REPORT*  Clinical Data: Abdominal pain, nausea  ACUTE ABDOMEN SERIES (ABDOMEN 2 VIEW & CHEST 1 VIEW)  Comparison: Chest radiograph - 02/06/2013; 07/13/2010; 05/17/2009  Findings:  Grossly unchanged borderline enlarged cardiac silhouette and mediastinal contours given slightly decreased lung volumes.  No focal airspace opacities.  No  pleural effusion or pneumothorax. No definite evidence of edema.  No definite pleural effusion or pneumothorax.  Moderate colonic stool burden without evidence of obstruction.  No pneumoperitoneum, pneumatosis or portal venous gas.  No definite abnormal intra-abdominal calcifications.  Mild scoliotic curvature of the thoracolumbar spine, possibly positional.  No acute osseous abnormality.  IMPRESSION: 1.  No acute cardiopulmonary disease. 2.  Nonobstructive bowel gas pattern.   Original Report Authenticated By: Jake Seats, MD    Results for VALARIE, FARACE (MRN [id number]) as of 03/12/2013 12:50  Ref. Range 07/12/2010 08:10 07/13/2010 05:00 07/13/2010 10:00 02/06/2013 08:40 03/12/2013 09:25  Hemoglobin Latest Range: 12.0-15.0 g/dL 7.9 (L) 7.2 (L) 7.2 (L) 8.9 (L) 8.3 (L)  HCT Latest Range: 36.0-46.0 % 27.4 (L) 24.9 (L) 24.5 (L) 32.7 (L) 30.3 (L)    1245:  Pt's abd discomfort completely improved after GI cocktail and PO pepcid.  Wants to go home now. Will continue tx symptomatically, f/u GI MD.  Dx and testing d/w pt.  Questions answered.  Verb understanding, agreeable to d/c home with outpt f/u.        Alfonzo Feller, DO 03/15/13 1436

## 2013-03-12 NOTE — Progress Notes (Signed)
[facility] has seen patient and provided her with a OC application.

## 2014-04-07 ENCOUNTER — Ambulatory Visit: Payer: Medicaid Other | Admitting: Family Medicine

## 2014-04-18 ENCOUNTER — Ambulatory Visit: Payer: Medicaid Other | Admitting: Family Medicine

## 2014-05-02 ENCOUNTER — Encounter: Payer: Self-pay | Admitting: Family Medicine

## 2014-05-02 ENCOUNTER — Ambulatory Visit (INDEPENDENT_AMBULATORY_CARE_PROVIDER_SITE_OTHER): Payer: Medicaid Other | Admitting: Family Medicine

## 2014-05-02 DIAGNOSIS — E010 Iodine-deficiency related diffuse (endemic) goiter: Secondary | ICD-10-CM

## 2014-05-02 DIAGNOSIS — G4733 Obstructive sleep apnea (adult) (pediatric): Secondary | ICD-10-CM | POA: Insufficient documentation

## 2014-05-02 DIAGNOSIS — M549 Dorsalgia, unspecified: Secondary | ICD-10-CM

## 2014-05-02 DIAGNOSIS — E049 Nontoxic goiter, unspecified: Secondary | ICD-10-CM

## 2014-05-02 DIAGNOSIS — J45909 Unspecified asthma, uncomplicated: Secondary | ICD-10-CM

## 2014-05-02 HISTORY — DX: Obstructive sleep apnea (adult) (pediatric): G47.33

## 2014-05-02 HISTORY — DX: Iodine-deficiency related diffuse (endemic) goiter: E01.0

## 2014-05-02 MED ORDER — CYCLOBENZAPRINE HCL 10 MG PO TABS: 10.0000 mg | ORAL_TABLET | Freq: Three times a day (TID) | ORAL | Status: DC | PRN

## 2014-05-02 MED ORDER — NAPROXEN 500 MG PO TABS: 500.0000 mg | ORAL_TABLET | Freq: Two times a day (BID) | ORAL | Status: DC

## 2014-05-02 MED ORDER — BECLOMETHASONE DIPROPIONATE 80 MCG/ACT IN AERS: 2.0000 | INHALATION_SPRAY | Freq: Two times a day (BID) | RESPIRATORY_TRACT | Status: DC

## 2014-05-02 NOTE — Assessment & Plan Note (Signed)
New.  Pt has seen pulmonary in the past.  Will re-refer due to pt's body habitus and hx of OSA.  Pt now desires CPAP.

## 2014-05-02 NOTE — Assessment & Plan Note (Signed)
Deteriorated.  Pt continues to gain weight.  Not exercising, not following particular diet.  Open to nutrition referral 'as long as they don't look like me'.  Not interested in bariatric surgery at this time.  Stressed that this is the biggest health risk she is facing and that for her health and the sake of her 34 yr old daughter, we need to make dramatic changes.  Check labs to risk stratify.  Will follow closely.

## 2014-05-02 NOTE — Progress Notes (Signed)
Pre visit review using our clinic review tool, if applicable. No additional management support is needed unless otherwise documented below in the visit note. 

## 2014-05-02 NOTE — Patient Instructions (Signed)
Follow up in 6-8 weeks to recheck weight loss progress We'll call you with your thyroid US, your pulmonary and your nutrition appts We'll notify you of your lab results and make any changes if needed Please try and make healthy food choices and start walking daily Start the Qvar- 2 puffs twice daily- every day Use the albuterol as needed Call with any questions or concerns Hang in there!  You can do this!

## 2014-05-02 NOTE — Progress Notes (Signed)
   Subjective:    Patient ID: Dominique [name], female    DOB: 06-16-1981, 33 y.o.   MRN: [id number]  HPI Pt to re-establish after >3 yr absence.  Morbid obesity- pt's BMI is now 82.5 after gaining 55 lbs since 2011.  Pt reports that today's weight of 462 is down 11 lbs from last check.  Weight loss was unintentional.  No CP.  Pt is attempting to get some physical activity by 'going out during the daytime'.  Not following particular diet.  Asthma- chronic problem, pt is out of preventative and is requiring her rescue inhaler on a daily basis.  Pt ended up in ER in May for asthma flare.  Pt previously on Advair.  Pt denies SOB at night but will wake w/ some wheezing.  Not exercising.  OSA- pt previously saw pulmonary and is asking for a CPAP machine.  Having excessive daytime sleepiness.   Review of Systems For ROS see HPI     Objective:   Physical Exam  Vitals reviewed. Constitutional: She is oriented to person, place, and time. She appears well-developed and well-nourished. No distress.  Morbidly obese  HENT:  Head: Normocephalic and atraumatic.  Eyes: Conjunctivae and EOM are normal. Pupils are equal, round, and reactive to light.  Neck: Normal range of motion. Neck supple. Thyromegaly (w/ possible nodularity) present.  Cardiovascular: Normal rate, regular rhythm, normal heart sounds and intact distal pulses.   No murmur heard. Pulmonary/Chest: Effort normal and breath sounds normal. No respiratory distress.  Abdominal: Soft. She exhibits no distension. There is no tenderness.  Musculoskeletal: She exhibits no edema.  Lymphadenopathy:    She has no cervical adenopathy.  Neurological: She is alert and oriented to person, place, and time.  Skin: Skin is warm and dry.  Psychiatric: She has a normal mood and affect. Her behavior is normal.          Assessment & Plan:

## 2014-05-02 NOTE — Assessment & Plan Note (Signed)
New.  Check labs and get [place] to assess.

## 2014-05-02 NOTE — Assessment & Plan Note (Signed)
Ongoing issue for pt due to large breasts and trap spasms.  Refill of Naproxen and flexeril provided.

## 2014-05-02 NOTE — Assessment & Plan Note (Signed)
Deteriorated.  Pt is now using rescue inhaler daily due to lapse in controller med script.  Will start daily Qvar and monitor for sxs improvement.

## 2014-05-03 LAB — HEMOGLOBIN A1C: HEMOGLOBIN A1C: 5.6 % (ref 4.6–6.5)

## 2014-05-03 LAB — CBC WITH DIFFERENTIAL/PLATELET
BASOS ABS: 0.1 10*3/uL (ref 0.0–0.1)
Basophils Relative: 0.9 % (ref 0.0–3.0)
EOS PCT: 5.3 % — AB (ref 0.0–5.0)
Eosinophils Absolute: 0.4 10*3/uL (ref 0.0–0.7)
HEMATOCRIT: 30.1 % — AB (ref 36.0–46.0)
Hemoglobin: 8.8 g/dL — ABNORMAL LOW (ref 12.0–15.0)
LYMPHS ABS: 1.9 10*3/uL (ref 0.7–4.0)
Lymphocytes Relative: 25.6 % (ref 12.0–46.0)
MCHC: 29.2 g/dL — ABNORMAL LOW (ref 30.0–36.0)
MCV: 63.4 fl — ABNORMAL LOW (ref 78.0–100.0)
MONO ABS: 0.5 10*3/uL (ref 0.1–1.0)
MONOS PCT: 6.8 % (ref 3.0–12.0)
NEUTROS PCT: 61.4 % (ref 43.0–77.0)
Neutro Abs: 4.5 10*3/uL (ref 1.4–7.7)
PLATELETS: 376 10*3/uL (ref 150.0–400.0)
RBC: 4.75 Mil/uL (ref 3.87–5.11)
RDW: 21 % — AB (ref 11.5–15.5)
WBC: 7.4 10*3/uL (ref 4.0–10.5)

## 2014-05-03 LAB — HEPATIC FUNCTION PANEL
ALBUMIN: 3.4 g/dL — AB (ref 3.5–5.2)
ALK PHOS: 48 U/L (ref 39–117)
ALT: 14 U/L (ref 0–35)
AST: 16 U/L (ref 0–37)
Bilirubin, Direct: 0.1 mg/dL (ref 0.0–0.3)
Total Bilirubin: 0.7 mg/dL (ref 0.2–1.2)
Total Protein: 7.5 g/dL (ref 6.0–8.3)

## 2014-05-03 LAB — BASIC METABOLIC PANEL
BUN: 7 mg/dL (ref 6–23)
CHLORIDE: 102 meq/L (ref 96–112)
CO2: 27 meq/L (ref 19–32)
CREATININE: 0.5 mg/dL (ref 0.4–1.2)
Calcium: 8.8 mg/dL (ref 8.4–10.5)
GFR: 186.02 mL/min (ref >60.00)
GLUCOSE: 74 mg/dL (ref 70–99)
POTASSIUM: 4.1 meq/L (ref 3.5–5.1)
Sodium: 137 mEq/L (ref 135–145)

## 2014-05-03 LAB — LIPID PANEL
CHOLESTEROL: 149 mg/dL (ref 0–200)
HDL: 44.1 mg/dL (ref >39.00)
LDL Cholesterol: 93 mg/dL (ref 0–99)
NonHDL: 104.9
TRIGLYCERIDES: 62 mg/dL (ref 0.0–149.0)
Total CHOL/HDL Ratio: 3
VLDL: 12.4 mg/dL (ref 0.0–40.0)

## 2014-05-03 LAB — TSH: TSH: 0.81 u[IU]/mL (ref 0.35–4.50)

## 2014-05-04 ENCOUNTER — Encounter: Payer: Self-pay | Admitting: General Practice

## 2014-05-04 ENCOUNTER — Other Ambulatory Visit: Payer: Self-pay | Admitting: General Practice

## 2014-05-04 ENCOUNTER — Ambulatory Visit (HOSPITAL_BASED_OUTPATIENT_CLINIC_OR_DEPARTMENT_OTHER): Payer: Medicaid Other

## 2014-05-04 MED ORDER — FERROUS SULFATE 325 (65 FE) MG PO TABS: 325.0000 mg | ORAL_TABLET | Freq: Every day | ORAL | Status: DC

## 2014-05-13 ENCOUNTER — Telehealth: Payer: Self-pay | Admitting: Family Medicine

## 2014-05-13 MED ORDER — ALBUTEROL SULFATE HFA 108 (90 BASE) MCG/ACT IN AERS: 2.0000 | INHALATION_SPRAY | Freq: Four times a day (QID) | RESPIRATORY_TRACT | Status: DC | PRN

## 2014-05-13 NOTE — Telephone Encounter (Signed)
Caller name: Adellyn Relation to pt: Call back [phone]  Pharmacy: [place]  Reason for call:  Pt is needing the rx albuterol (PROVENTIL HFA;VENTOLIN HFA) 108 (90 BASE) MCG/ACT inhaler filled.

## 2014-05-13 NOTE — Telephone Encounter (Signed)
Med filled.  

## 2014-06-13 ENCOUNTER — Ambulatory Visit (INDEPENDENT_AMBULATORY_CARE_PROVIDER_SITE_OTHER): Payer: Medicaid Other | Admitting: Family Medicine

## 2014-06-13 ENCOUNTER — Encounter: Payer: Self-pay | Admitting: Family Medicine

## 2014-06-13 ENCOUNTER — Other Ambulatory Visit: Payer: Self-pay | Admitting: General Practice

## 2014-06-13 ENCOUNTER — Institutional Professional Consult (permissible substitution): Payer: Medicaid Other | Admitting: Pulmonary Disease

## 2014-06-13 DIAGNOSIS — J302 Other seasonal allergic rhinitis: Secondary | ICD-10-CM

## 2014-06-13 DIAGNOSIS — J309 Allergic rhinitis, unspecified: Secondary | ICD-10-CM

## 2014-06-13 HISTORY — DX: Other seasonal allergic rhinitis: J30.2

## 2014-06-13 MED ORDER — PROMETHAZINE-DM 6.25-15 MG/5ML PO SYRP: 5.0000 mL | ORAL_SOLUTION | Freq: Four times a day (QID) | ORAL | Status: DC | PRN

## 2014-06-13 MED ORDER — ALBUTEROL SULFATE HFA 108 (90 BASE) MCG/ACT IN AERS: 2.0000 | INHALATION_SPRAY | Freq: Four times a day (QID) | RESPIRATORY_TRACT | Status: DC | PRN

## 2014-06-13 MED ORDER — BENZONATATE 200 MG PO CAPS: 200.0000 mg | ORAL_CAPSULE | Freq: Three times a day (TID) | ORAL | Status: DC | PRN

## 2014-06-13 NOTE — Assessment & Plan Note (Signed)
Deteriorated.  Pt has gained 16 lbs in 6 weeks.  Reviewed dietary choices w/ her and the need to eliminate soda and fried foods from diet- pt's salad is offset by daily wings.  Pt reports that she never got a call about her nutrition appt but our records show they left 3 messages.  Will reorder.  Stressed need for daily exercise.  Again discussed that this is currently the biggest threat to her health.  Will continue to follow closely.

## 2014-06-13 NOTE — Progress Notes (Signed)
Pre visit review using our clinic review tool, if applicable. No additional management support is needed unless otherwise documented below in the visit note. 

## 2014-06-13 NOTE — Assessment & Plan Note (Signed)
New.  Suspect this is the cause of pt's sxs.  Start Claritin or Zyrtec daily.  Drink plenty of fluids.  Reviewed supportive care and red flags that should prompt return.  Pt expressed understanding and is in agreement w/ plan.

## 2014-06-13 NOTE — Progress Notes (Signed)
   Subjective:    Patient ID: Dominique [name], female    DOB: 11/24/1980, 33 y.o.   MRN: [id number]  HPI Obesity- chronic problem, pt gained 16 lbs since 8/10.  Pt reports she started a new job which requires her to be more on her feet and she was 'so swollen it was painful'.  Pt reports changing her eating habits ~3 weeks ago and the swelling has started to improve.  Not exercising.  Trying to limit sodas- was drinking at least 2 sodas/day at work.  Eating a protein bar in the AM, then will have cheese sandwich, a snack before lunch (grapes, fruit snacks, candy bar, Nabs, crackers), salad w/ meat (usually chicken wings) for lunch, PM snack (grapes), home cooked dinner- 'a starch, a meat, and a salad'.  'trying to stop frying'.  Pt 'never got a call' about nutrition appt.  'Saturday is my cheat day'.  URI- pt reports nasal congestion, cough.  No fevers.  No HAs.  No ear pain, SOB.   Review of Systems For ROS see HPI     Objective:   Physical Exam  Vitals reviewed. Constitutional: She appears well-developed and well-nourished. No distress.  Morbidly obese  HENT:  Head: Normocephalic and atraumatic.  Right Ear: Tympanic membrane normal.  Left Ear: Tympanic membrane normal.  Nose: Mucosal edema and rhinorrhea present. Right sinus exhibits no maxillary sinus tenderness and no frontal sinus tenderness. Left sinus exhibits no maxillary sinus tenderness and no frontal sinus tenderness.  Mouth/Throat: Mucous membranes are normal. Posterior oropharyngeal erythema (w/ PND) present.  Eyes: Conjunctivae and EOM are normal. Pupils are equal, round, and reactive to light.  Neck: Normal range of motion. Neck supple.  Cardiovascular: Normal rate, regular rhythm and normal heart sounds.   Pulmonary/Chest: Effort normal and breath sounds normal. No respiratory distress. She has no wheezes. She has no rales.  Lymphadenopathy:    She has no cervical adenopathy.          Assessment & Plan:

## 2014-06-13 NOTE — Patient Instructions (Signed)
Follow up in 2 months to recheck BP and weight loss progress Use the Tessalon as needed for daytime cough Use the cough syrup as needed at night/weekends- will cause drowsiness Start OTC Claritin or Zyrtec daily to improve nasal congestion, post nasal drip Drink plenty of fluids- water, NOT soda! Try and limit your intake of fried/fatty foods, increase your fruit and veggie intake, drink plenty of water We'll call you with your nutrition appt Try and get regular exercise Call with any questions or concerns Hang in there!!

## 2014-06-27 ENCOUNTER — Ambulatory Visit: Payer: Medicaid Other | Admitting: *Deleted

## 2014-06-28 ENCOUNTER — Institutional Professional Consult (permissible substitution): Payer: Medicaid Other | Admitting: Pulmonary Disease

## 2014-07-16 ENCOUNTER — Other Ambulatory Visit: Payer: Self-pay | Admitting: Family Medicine

## 2014-07-18 NOTE — Telephone Encounter (Signed)
Med filled.  

## 2014-08-16 ENCOUNTER — Ambulatory Visit: Payer: Medicaid Other | Admitting: Family Medicine

## 2014-08-16 ENCOUNTER — Telehealth: Payer: Self-pay | Admitting: *Deleted

## 2014-08-16 NOTE — Telephone Encounter (Signed)
Pt did not show for appointment 08/16/2014 at 10:45am for "2 month follow up to recheck bp and weight loss progress"

## 2014-08-17 NOTE — Telephone Encounter (Signed)
Pt needs no-show fee

## 2014-08-17 NOTE — Telephone Encounter (Signed)
See note below

## 2014-08-22 ENCOUNTER — Encounter (HOSPITAL_COMMUNITY): Payer: Self-pay | Admitting: Emergency Medicine

## 2014-08-22 ENCOUNTER — Emergency Department (HOSPITAL_COMMUNITY)
Admission: EM | Admit: 2014-08-22 | Discharge: 2014-08-23 | Disposition: A | Discharge status: Home / Self Care | Payer: Medicaid Other | Attending: Emergency Medicine | Admitting: Emergency Medicine

## 2014-08-22 ENCOUNTER — Emergency Department (HOSPITAL_COMMUNITY): Payer: Medicaid Other

## 2014-08-22 DIAGNOSIS — D649 Anemia, unspecified: Secondary | ICD-10-CM | POA: Diagnosis not present

## 2014-08-22 DIAGNOSIS — J45901 Unspecified asthma with (acute) exacerbation: Secondary | ICD-10-CM

## 2014-08-22 DIAGNOSIS — Z880 Allergy status to penicillin: Secondary | ICD-10-CM | POA: Insufficient documentation

## 2014-08-22 DIAGNOSIS — Z8669 Personal history of other diseases of the nervous system and sense organs: Secondary | ICD-10-CM | POA: Insufficient documentation

## 2014-08-22 DIAGNOSIS — R059 Cough, unspecified: Secondary | ICD-10-CM

## 2014-08-22 DIAGNOSIS — Z7951 Long term (current) use of inhaled steroids: Secondary | ICD-10-CM | POA: Diagnosis not present

## 2014-08-22 DIAGNOSIS — J069 Acute upper respiratory infection, unspecified: Secondary | ICD-10-CM | POA: Diagnosis not present

## 2014-08-22 DIAGNOSIS — Z791 Long term (current) use of non-steroidal anti-inflammatories (NSAID): Secondary | ICD-10-CM | POA: Diagnosis not present

## 2014-08-22 DIAGNOSIS — J45909 Unspecified asthma, uncomplicated: Secondary | ICD-10-CM | POA: Diagnosis present

## 2014-08-22 DIAGNOSIS — Z79899 Other long term (current) drug therapy: Secondary | ICD-10-CM | POA: Insufficient documentation

## 2014-08-22 DIAGNOSIS — Z720 Tobacco use: Secondary | ICD-10-CM | POA: Insufficient documentation

## 2014-08-22 DIAGNOSIS — R05 Cough: Secondary | ICD-10-CM

## 2014-08-22 MED ORDER — PREDNISONE 20 MG PO TABS
40.0000 mg | ORAL_TABLET | Freq: Once | ORAL | Status: AC
  Administered 2014-08-22: 40 mg via ORAL
  Filled 2014-08-22: qty 2

## 2014-08-22 MED ORDER — ALBUTEROL SULFATE HFA 108 (90 BASE) MCG/ACT IN AERS: 2.0000 | INHALATION_SPRAY | RESPIRATORY_TRACT | Status: DC | PRN

## 2014-08-22 MED ORDER — IBUPROFEN 800 MG PO TABS
800.0000 mg | ORAL_TABLET | Freq: Once | ORAL | Status: AC
  Administered 2014-08-22: 800 mg via ORAL
  Filled 2014-08-22: qty 1

## 2014-08-22 MED ORDER — AEROCHAMBER Z-STAT PLUS/MEDIUM MISC
1.0000 | Freq: Once | Status: AC
  Administered 2014-08-23: 1
  Filled 2014-08-22: qty 1

## 2014-08-22 MED ORDER — IPRATROPIUM BROMIDE 0.02 % IN SOLN
0.5000 mg | RESPIRATORY_TRACT | Status: AC
  Administered 2014-08-22: 0.5 mg via RESPIRATORY_TRACT
  Filled 2014-08-22: qty 2.5

## 2014-08-22 MED ORDER — ALBUTEROL SULFATE (2.5 MG/3ML) 0.083% IN NEBU
5.0000 mg | INHALATION_SOLUTION | Freq: Once | RESPIRATORY_TRACT | Status: AC
  Administered 2014-08-22: 5 mg via RESPIRATORY_TRACT
  Filled 2014-08-22: qty 6

## 2014-08-22 MED ORDER — PREDNISONE 20 MG PO TABS: 40.0000 mg | ORAL_TABLET | Freq: Every day | ORAL | Status: DC

## 2014-08-22 NOTE — ED Provider Notes (Signed)
CSN: [id number]     Arrival date & time 08/22/14  2034 History   First MD Initiated Contact with Patient 08/22/14 2117     Chief Complaint  Patient presents with  . Asthma   (Consider location/radiation/quality/duration/timing/severity/associated sxs/prior Treatment) HPI Dominique [name] is a 33 yo female presenting with report of scratchy throat followed by a cough.  This started 3 days ago.  Then 2 days ago, she felt like her asthma symptoms were getting worse including wheezing and chest tightness.  She describes the tightness in her chest as in her ribs and rates it 6-7/10.  She denies any hemoptysis, fever, nausea, vomiting, or abdominal pain.  Past Medical History  Diagnosis Date  . Asthma   . Sleep apnea   . Anemia    Past Surgical History  Procedure Laterality Date  . C section 2011     Family History  Problem Relation Age of Onset  . Hypertension Mother   . Hypertension Father   . Hypertension Sister   . Asthma Brother   . Diabetes Maternal Grandmother   . Hypertension Maternal Grandmother   . Hypertension Maternal Grandfather   . Cancer Paternal Grandmother     lung   History  Substance Use Topics  . Smoking status: Current Some Day Smoker    Start date: 02/21/2009  . Smokeless tobacco: Not on file  . Alcohol Use: No   OB History    No data available     Review of Systems  Constitutional: Negative for fever and chills.  HENT: Positive for congestion and rhinorrhea. Negative for sore throat.   Eyes: Negative for visual disturbance.  Respiratory: Positive for cough, shortness of breath and wheezing.   Cardiovascular: Negative for chest pain and leg swelling.  Gastrointestinal: Negative for nausea, vomiting and diarrhea.  Genitourinary: Negative for dysuria.  Musculoskeletal: Negative for myalgias.  Skin: Negative for rash.  Neurological: Negative for weakness, numbness and headaches.    Allergies  Penicillins  Home Medications   Prior to Admission  medications   Medication Sig Start Date End Date Taking? Authorizing Provider  Aspirin-Acetaminophen-Caffeine (GOODY HEADACHE PO) Take 1 packet by mouth every 6 (six) hours as needed (pain).    Historical Provider, MD  beclomethasone (QVAR) 80 MCG/ACT inhaler Inhale 2 puffs into the lungs 2 (two) times daily. 05/02/14   Midge Minium, MD  benzonatate (TESSALON) 200 MG capsule Take 1 capsule (200 mg total) by mouth 3 (three) times daily as needed for cough. 06/13/14   Midge Minium, MD  cyclobenzaprine (FLEXERIL) 10 MG tablet Take 1 tablet (10 mg total) by mouth 3 (three) times daily as needed for muscle spasms. 05/02/14   Midge Minium, MD  ferrous sulfate 325 (65 FE) MG tablet Take 1 tablet (325 mg total) by mouth daily with breakfast. 05/04/14   Midge Minium, MD  naproxen (NAPROSYN) 500 MG tablet Take 1 tablet (500 mg total) by mouth 2 (two) times daily with a meal. 05/02/14 05/02/15  Midge Minium, MD  PROAIR HFA 108 (90 BASE) MCG/ACT inhaler INHALE 2 PUFFS INTO THE LUNGS EVERY 6 HOURS AS NEEDED FOR WHEEZING OR SHORTNESS OF BREATH 07/18/14   Midge Minium, MD  promethazine-dextromethorphan (PROMETHAZINE-DM) 6.25-15 MG/5ML syrup Take 5 mLs by mouth 4 (four) times daily as needed. 06/13/14   Midge Minium, MD   BP 157/77 mmHg  Pulse 102  Temp(Src) 98.4 F (36.9 C) (Oral)  Resp 22  SpO2 95%  LMP 08/20/2014 (Exact  Date) Physical Exam  Constitutional: She appears well-developed and well-nourished. No distress.  HENT:  Head: Normocephalic and atraumatic.  Mouth/Throat: Oropharynx is clear and moist. No oropharyngeal exudate.  Eyes: Conjunctivae are normal.  Neck: Neck supple. No thyromegaly present.  Cardiovascular: Normal rate, regular rhythm and intact distal pulses.   Pulmonary/Chest: Effort normal. Tachypnea noted. No respiratory distress. She has decreased breath sounds in the right lower field and the left lower field. She has wheezes in the right middle field  and the left middle field. She has no rhonchi. She has no rales. She exhibits no tenderness.  Abdominal: Soft. There is no tenderness.  Musculoskeletal: She exhibits no tenderness.  Lymphadenopathy:    She has no cervical adenopathy.  Neurological: She is alert.  Skin: Skin is warm and dry. No rash noted. She is not diaphoretic.  Psychiatric: She has a normal mood and affect.  Nursing note and vitals reviewed.   ED Course  Procedures (including critical care time) Labs Review Labs Reviewed - No data to display  Imaging Review No results found.   EKG Interpretation None      MDM   Final diagnoses:  Cough  URI (upper respiratory infection)  Asthma exacerbation   33 yo female with symptoms are consistent with URI, likely viral etiology and exacerbation of her asthma symptoms.  Her CXR is negative for acute infiltrate.  Patient ambulated in ED with O2 saturations maintained 100%, no current signs of respiratory distress. Lung exam improved after nebulizer treatment. Prednisone given in the ED and pt will bd dc with 5 day burst. Pt states they are breathing at baseline. Pt has been instructed to continue using prescribed medications and to speak with PCP about today's exacerbation.  She verbalizes understanding and is agreeable with plan. Return precautions provided.    Filed Vitals:   08/22/14 2107 08/22/14 2316  BP: 157/77 114/86  Pulse: 102 98  Temp: 98.4 F (36.9 C)   TempSrc: Oral   Resp: 22 22  SpO2: 95% 100%   Meds given in ED:  Medications  albuterol (PROVENTIL) (2.5 MG/3ML) 0.083% nebulizer solution 5 mg (5 mg Nebulization Given 08/22/14 2213)  ipratropium (ATROVENT) nebulizer solution 0.5 mg (0.5 mg Nebulization Given 08/22/14 2214)  predniSONE (DELTASONE) tablet 40 mg (40 mg Oral Given 08/22/14 2210)  ibuprofen (ADVIL,MOTRIN) tablet 800 mg (800 mg Oral Given 08/22/14 2313)  aerochamber Z-Stat Plus/medium 1 each (1 each Other Given 08/23/14 0046)    Discharge  Medication List as of 08/22/2014 11:44 PM    START taking these medications   Details  predniSONE (DELTASONE) 20 MG tablet Take 2 tablets (40 mg total) by mouth daily., Starting 08/22/2014, Until Discontinued, Print           Britt Bottom, NP 08/24/14 2683  Dorie Rank, MD 08/25/14 1251

## 2014-08-22 NOTE — ED Notes (Signed)
Pt states she has had cold symptoms since Thursday night and a cough  Pt states it has caused her asthma to flare up  Pt is coughing and has audible wheezing noted in triage

## 2014-08-22 NOTE — Discharge Instructions (Signed)
Please follow the directions provided.  Be sure to follow-up with your primary care provider to ensure you are getting better.  Use the the spacer with your inhaler and take the prednisone daily for 5 days.  Don't hesitate to return for new, worsening or concerning symptoms.     SEEK IMMEDIATE MEDICAL CARE IF:  You have a fever.  You develop severe or persistent headache, ear pain, sinus pain, or chest pain.  You develop wheezing, a prolonged cough, cough up blood, or have a change in your usual mucus (if you have chronic lung disease).  You develop sore muscles or a stiff neck.

## 2014-08-26 ENCOUNTER — Ambulatory Visit (INDEPENDENT_AMBULATORY_CARE_PROVIDER_SITE_OTHER): Payer: Medicaid Other | Admitting: Family Medicine

## 2014-08-26 ENCOUNTER — Encounter: Payer: Self-pay | Admitting: Family Medicine

## 2014-08-26 VITALS — BP 120/87 | HR 116 | Temp 98.2°F | Resp 16 | Wt >= 6400 oz

## 2014-08-26 DIAGNOSIS — R062 Wheezing: Secondary | ICD-10-CM

## 2014-08-26 DIAGNOSIS — J4541 Moderate persistent asthma with (acute) exacerbation: Secondary | ICD-10-CM

## 2014-08-26 MED ORDER — IPRATROPIUM-ALBUTEROL 0.5-2.5 (3) MG/3ML IN SOLN
3.0000 mL | Freq: Once | RESPIRATORY_TRACT | Status: AC
  Administered 2014-08-26: 3 mL via RESPIRATORY_TRACT

## 2014-08-26 MED ORDER — AZITHROMYCIN 250 MG PO TABS: ORAL_TABLET | ORAL | Status: DC

## 2014-08-26 MED ORDER — PROMETHAZINE-DM 6.25-15 MG/5ML PO SYRP: 5.0000 mL | ORAL_SOLUTION | Freq: Four times a day (QID) | ORAL | Status: DC | PRN

## 2014-08-26 NOTE — Patient Instructions (Signed)
Follow up as needed Start the Zpack as directed Complete the prednisone as directed Drink plenty of water Use the cough syrup as needed- will cause drowsiness Mucinex DM for daytime cough Albuterol inhaler every 4 hrs as needed REST! Call with any questions or concerns Happy Holidays!

## 2014-08-26 NOTE — Progress Notes (Signed)
   Subjective:    Patient ID: Dominique [name], female    DOB: 1981/02/09, 33 y.o.   MRN: [id number]  HPI URI- pt went to ER on 11/30 and was given inhalers and prednisone x5 days for asthma exacerbation.  No cough meds or antibiotics.  Still having SOB and wheezing.  Denies sinus pain/pressure.  + HA from coughing.  CXR on 11/30 was normal.  No fevers.  Using Qvar and albuterol.  Cough is dry, nonproductive.   Review of Systems For ROS see HPI     Objective:   Physical Exam  Constitutional: She appears well-developed and well-nourished. No distress.  HENT:  Head: Normocephalic and atraumatic.  TMs normal bilaterally Mild nasal congestion Throat w/out erythema, edema, or exudate  Eyes: Conjunctivae and EOM are normal. Pupils are equal, round, and reactive to light.  Neck: Normal range of motion. Neck supple.  Cardiovascular: Normal rate, regular rhythm, normal heart sounds and intact distal pulses.   No murmur heard. Pulmonary/Chest: Effort normal. No respiratory distress. She has wheezes (scattered expiratory wheezing, decreased air movement- improved s/p neb tx).  + hacking cough  Lymphadenopathy:    She has no cervical adenopathy.  Vitals reviewed.         Assessment & Plan:

## 2014-08-26 NOTE — Assessment & Plan Note (Signed)
New.  Pt's sxs and recent hx consistent w/ asthma exacerbation and possible bronchitis.  Air movement and wheezing improved s/p neb tx.  Start abx.  Continue steroids.  Cough meds prn.  Work note provided.  Reviewed supportive care and red flags that should prompt return.  Pt expressed understanding and is in agreement w/ plan.

## 2014-08-26 NOTE — Progress Notes (Signed)
Pre visit review using our clinic review tool, if applicable. No additional management support is needed unless otherwise documented below in the visit note. 

## 2014-08-29 ENCOUNTER — Telehealth: Payer: Self-pay | Admitting: Family Medicine

## 2014-08-29 NOTE — Telephone Encounter (Signed)
emmi emailed °

## 2014-09-29 ENCOUNTER — Telehealth: Payer: Self-pay | Admitting: Family Medicine

## 2014-09-29 DIAGNOSIS — G4733 Obstructive sleep apnea (adult) (pediatric): Secondary | ICD-10-CM

## 2014-09-29 NOTE — Telephone Encounter (Signed)
Caller name: Loris Relation to pt: self Call back number: [phone] Pharmacy:  Reason for call:   Patient states that Dr. Birdie Riddle referred her to have a sleep study done last year but patient never went. Patient called Dr. Elsworth Soho office today and was told that a new referral would need to be started.

## 2014-09-29 NOTE — Telephone Encounter (Signed)
Referral placed.

## 2014-10-21 ENCOUNTER — Other Ambulatory Visit: Payer: Self-pay | Admitting: Family Medicine

## 2014-10-24 NOTE — Telephone Encounter (Signed)
Med filled.  

## 2014-10-27 ENCOUNTER — Encounter: Payer: Self-pay | Admitting: Pulmonary Disease

## 2014-10-27 ENCOUNTER — Ambulatory Visit (INDEPENDENT_AMBULATORY_CARE_PROVIDER_SITE_OTHER): Payer: Medicaid Other | Admitting: Pulmonary Disease

## 2014-10-27 VITALS — BP 162/76 | HR 104 | Temp 98.3°F | Ht 62.5 in | Wt >= 6400 oz

## 2014-10-27 DIAGNOSIS — G4733 Obstructive sleep apnea (adult) (pediatric): Secondary | ICD-10-CM | POA: Diagnosis not present

## 2014-10-27 DIAGNOSIS — J4541 Moderate persistent asthma with (acute) exacerbation: Secondary | ICD-10-CM

## 2014-10-27 MED ORDER — FUROSEMIDE 20 MG PO TABS: 20.0000 mg | ORAL_TABLET | Freq: Every day | ORAL | Status: DC

## 2014-10-27 MED ORDER — ALBUTEROL SULFATE (2.5 MG/3ML) 0.083% IN NEBU: 2.5000 mg | INHALATION_SOLUTION | Freq: Four times a day (QID) | RESPIRATORY_TRACT | Status: DC | PRN

## 2014-10-27 NOTE — Assessment & Plan Note (Signed)
Feel she has decompensated OSA with perhaps a component of OHS Get back on CPAP machine - supplies will be renewed by [facility] - call if you dont get this by Monday CPAP titration study - you may need oxygen Lasix 20 mg daily x 2 weeks  Weight loss encouraged, compliance with goal of at least 4-6 hrs every night is the expectation. Advised against medications with sedative side effects Cautioned against driving when sleepy - understanding that sleepiness will vary on a day to day basis

## 2014-10-27 NOTE — Patient Instructions (Signed)
Lasix 20 mg daily x 2 weeks Get back on CPAP machine - supplies will be renewed by [facility] - call if you dont get this by Monday CPAP titration study - you may need oxygen

## 2014-10-27 NOTE — Progress Notes (Signed)
Subjective:    Patient ID: Dominique [name], female    DOB: Nov 21, 1980, 34 y.o.   MRN: [id number]  HPI   34 year old morbidly obese woman presents for evaluation of sleep disordered breathing. She was diagnosed with severe OSA in 2011 when she was pregnant. PSG 07/2010 - weight 425 pounds-total sleep time 123 minutes, AHI 171 per hour. After delivery, she was discharged on CPAP and slept beautifully for a few months. She then started feeling better and felt that OSA dissolved and stopped using it. She is now sleepy all the time, Epworth sleepiness score is 18, she lost her job due to dosing at work, she cannot stay awake during a show, she finds it hard to drive, she also reports hallucinations sometimes, which he thinks is due to lack of sleep. Bedtime is around 2 AM, sleep latency is 30 minutes, moves around a lot and does not prefer to position, uses 2 pillows, at least 4 nocturnal awakenings with nocturia, out of bed by 11:30 feeling tired with dryness of mouth and occasional headaches. She has gained 50 pounds in the last 4 years.  She reports asthma that is well controlled on inhaled steroid. She now has a cold, oxygen saturation is 89% today. She is a nebulizer but does not have medications for this She reports increasing pedal edema over the last few weeks and she has been elevating her legs daily.  Past Medical History  Diagnosis Date  . Asthma   . Sleep apnea   . Anemia    Past Surgical History  Procedure Laterality Date  . C section 2011  2011    Allergies  Allergen Reactions  . Penicillins     Unknown childhood allergy    History   Social History  . Marital Status: Single    Spouse Name: N/A    Number of Children: N/A  . Years of Education: N/A   Occupational History  . Not on file.   Social History Main Topics  . Smoking status: Former Smoker -- 0.50 packs/day for 4 years    Types: Cigarettes    Start date: 07/24/2014  . Smokeless tobacco: Never Used  .  Alcohol Use: No  . Drug Use: No  . Sexual Activity: Not on file   Other Topics Concern  . Not on file   Social History Narrative    Family History  Problem Relation Age of Onset  . Hypertension Mother   . Hypertension Father   . Hypertension Sister   . Asthma Brother   . Diabetes Maternal Grandmother   . Hypertension Maternal Grandmother   . Hypertension Maternal Grandfather   . Cancer Paternal Grandmother     lung  . Emphysema Paternal Grandfather   . Allergies Mother      Review of Systems  Constitutional: Negative for fever and unexpected weight change.  HENT: Positive for congestion and sore throat. Negative for dental problem, ear pain, nosebleeds, postnasal drip, rhinorrhea, sinus pressure, sneezing and trouble swallowing.   Eyes: Negative for redness and itching.  Respiratory: Positive for cough, shortness of breath and wheezing. Negative for chest tightness.   Cardiovascular: Positive for leg swelling. Negative for palpitations.  Gastrointestinal: Negative for nausea and vomiting.  Genitourinary: Negative for dysuria.  Musculoskeletal: Negative for joint swelling.  Skin: Negative for rash.  Neurological: Positive for headaches.  Hematological: Does not bruise/bleed easily.  Psychiatric/Behavioral: Negative for dysphoric mood. The patient is not nervous/anxious.        Objective:  Physical Exam  Gen. Pleasant, obese, in no distress, normal affect ENT - no lesions, no post nasal drip, class 2-3 airway Neck: No JVD, no thyromegaly, no carotid bruits Lungs: no use of accessory muscles, no dullness to percussion, decreased without rales or rhonchi  Cardiovascular: Rhythm regular, heart sounds  normal, no murmurs or gallops, no peripheral edema Abdomen: soft and non-tender, no hepatosplenomegaly, BS normal. Musculoskeletal: No deformities, no cyanosis or clubbing Neuro:  alert, non focal, no tremors        Assessment & Plan:

## 2014-10-27 NOTE — Assessment & Plan Note (Signed)
Albuterol nebs will be prescribed. I do not feel that she needs systemic steroids. Again, this may be attributable to her weight, and will be reassessed in the future.

## 2014-11-08 ENCOUNTER — Telehealth: Payer: Self-pay | Admitting: Pulmonary Disease

## 2014-11-08 NOTE — Telephone Encounter (Signed)
LMTCB

## 2014-11-09 NOTE — Telephone Encounter (Signed)
lmtcb for pt.  

## 2014-11-10 NOTE — Telephone Encounter (Signed)
Attempted to call pt. Pt had terrible reception, will need to call back.

## 2014-11-11 ENCOUNTER — Emergency Department (HOSPITAL_COMMUNITY): Payer: Medicaid Other

## 2014-11-11 ENCOUNTER — Encounter (HOSPITAL_COMMUNITY): Payer: Self-pay | Admitting: Emergency Medicine

## 2014-11-11 ENCOUNTER — Emergency Department (HOSPITAL_COMMUNITY)
Admission: EM | Admit: 2014-11-11 | Discharge: 2014-11-12 | Disposition: A | Discharge status: Home / Self Care | Payer: Medicaid Other | Attending: Emergency Medicine | Admitting: Emergency Medicine

## 2014-11-11 DIAGNOSIS — Z791 Long term (current) use of non-steroidal anti-inflammatories (NSAID): Secondary | ICD-10-CM | POA: Diagnosis not present

## 2014-11-11 DIAGNOSIS — K644 Residual hemorrhoidal skin tags: Secondary | ICD-10-CM | POA: Insufficient documentation

## 2014-11-11 DIAGNOSIS — K5900 Constipation, unspecified: Secondary | ICD-10-CM | POA: Diagnosis not present

## 2014-11-11 DIAGNOSIS — Z7951 Long term (current) use of inhaled steroids: Secondary | ICD-10-CM | POA: Diagnosis not present

## 2014-11-11 DIAGNOSIS — Z880 Allergy status to penicillin: Secondary | ICD-10-CM | POA: Diagnosis not present

## 2014-11-11 DIAGNOSIS — Z87891 Personal history of nicotine dependence: Secondary | ICD-10-CM | POA: Diagnosis not present

## 2014-11-11 DIAGNOSIS — D649 Anemia, unspecified: Secondary | ICD-10-CM | POA: Diagnosis not present

## 2014-11-11 DIAGNOSIS — Z8669 Personal history of other diseases of the nervous system and sense organs: Secondary | ICD-10-CM | POA: Diagnosis not present

## 2014-11-11 DIAGNOSIS — J45909 Unspecified asthma, uncomplicated: Secondary | ICD-10-CM | POA: Insufficient documentation

## 2014-11-11 DIAGNOSIS — Z79899 Other long term (current) drug therapy: Secondary | ICD-10-CM | POA: Insufficient documentation

## 2014-11-11 NOTE — Telephone Encounter (Signed)
lmtcb for pt.  

## 2014-11-11 NOTE — ED Notes (Signed)
Pt reports she has not had a full bowel movement in 3 weeks and c/o abdominal pain.

## 2014-11-11 NOTE — Telephone Encounter (Signed)
[phone], pt cb

## 2014-11-12 LAB — CBC WITH DIFFERENTIAL/PLATELET
BASOS ABS: 0.1 10*3/uL (ref 0.0–0.1)
Basophils Relative: 1 % (ref 0–1)
Eosinophils Absolute: 0.2 10*3/uL (ref 0.0–0.7)
Eosinophils Relative: 3 % (ref 0–5)
HCT: 35 % — ABNORMAL LOW (ref 36.0–46.0)
HEMOGLOBIN: 9 g/dL — AB (ref 12.0–15.0)
Lymphocytes Relative: 20 % (ref 12–46)
Lymphs Abs: 1.3 10*3/uL (ref 0.7–4.0)
MCH: 17 pg — ABNORMAL LOW (ref 26.0–34.0)
MCHC: 25.7 g/dL — AB (ref 30.0–36.0)
MCV: 66 fL — ABNORMAL LOW (ref 78.0–100.0)
MONO ABS: 0.9 10*3/uL (ref 0.1–1.0)
Monocytes Relative: 15 % — ABNORMAL HIGH (ref 3–12)
NEUTROS ABS: 3.8 10*3/uL (ref 1.7–7.7)
Neutrophils Relative %: 61 % (ref 43–77)
PLATELETS: ADEQUATE 10*3/uL (ref 150–400)
RBC: 5.3 MIL/uL — AB (ref 3.87–5.11)
RDW: 21.1 % — ABNORMAL HIGH (ref 11.5–15.5)
WBC: 6.3 10*3/uL (ref 4.0–10.5)

## 2014-11-12 LAB — COMPREHENSIVE METABOLIC PANEL
ALBUMIN: 3.7 g/dL (ref 3.5–5.2)
ALT: 18 U/L (ref 0–35)
AST: 25 U/L (ref 0–37)
Alkaline Phosphatase: 48 U/L (ref 39–117)
Anion gap: 8 (ref 5–15)
BILIRUBIN TOTAL: 0.7 mg/dL (ref 0.3–1.2)
BUN: 12 mg/dL (ref 6–23)
CO2: 29 mmol/L (ref 19–32)
Calcium: 8.7 mg/dL (ref 8.4–10.5)
Chloride: 102 mmol/L (ref 96–112)
Creatinine, Ser: 0.58 mg/dL (ref 0.50–1.10)
GFR calc Af Amer: >90 mL/min (ref >90)
GLUCOSE: 83 mg/dL (ref 70–99)
POTASSIUM: 3.8 mmol/L (ref 3.5–5.1)
Sodium: 139 mmol/L (ref 135–145)
Total Protein: 7.6 g/dL (ref 6.0–8.3)

## 2014-11-12 LAB — LIPASE, BLOOD: Lipase: 17 U/L (ref 11–59)

## 2014-11-12 LAB — URINALYSIS, ROUTINE W REFLEX MICROSCOPIC
GLUCOSE, UA: NEGATIVE mg/dL
HGB URINE DIPSTICK: NEGATIVE
Ketones, ur: NEGATIVE mg/dL
LEUKOCYTES UA: NEGATIVE
Nitrite: NEGATIVE
PH: 6 (ref 5.0–8.0)
Protein, ur: NEGATIVE mg/dL
SPECIFIC GRAVITY, URINE: 1.025 (ref 1.005–1.030)
UROBILINOGEN UA: 4 mg/dL — AB (ref 0.0–1.0)

## 2014-11-12 LAB — POC OCCULT BLOOD, ED: Fecal Occult Bld: POSITIVE — AB

## 2014-11-12 MED ORDER — POLYETHYLENE GLYCOL 3350 17 G PO PACK: 17.0000 g | PACK | Freq: Every day | ORAL | Status: DC

## 2014-11-12 MED ORDER — KETOROLAC TROMETHAMINE 30 MG/ML IJ SOLN
30.0000 mg | Freq: Once | INTRAMUSCULAR | Status: AC
  Administered 2014-11-12: 30 mg via INTRAVENOUS
  Filled 2014-11-12: qty 1

## 2014-11-12 NOTE — ED Provider Notes (Signed)
CSN: [id number]     Arrival date & time 11/11/14  2137 History   First MD Initiated Contact with Patient 11/11/14 2249     Chief Complaint  Patient presents with  . Constipation   HPI  Patient is a 34 year old female who presents emergency room for evaluation of abdominal pain and constipation. Patient states that for the past 3-4 weeks she has had unsatisfied and bowel movements. She feels like she can only have small bowel movements and feels like she is incompletely emptied. She has been straining to use the restroom and has noticed small amounts of blood in her stool. Patient does have a history of hemorrhoids. She states that she has intermittent lower abdominal pain that is crampy like she needs to use the restroom. Patient denies fevers, chills, nausea, vomiting, melena, rectal pain, tenesmus. Patient has been trying Dulcolax at home with no relief. Patient has no history of problems with constipation. Nothing seems to make her feel much better. She is also been trying Aleve with no relief. Patient has continued to pass gas. Patient had her last bowel movement at 9 PM today.  Past Medical History  Diagnosis Date  . Asthma   . Sleep apnea   . Anemia    Past Surgical History  Procedure Laterality Date  . C section 2011  2011   Family History  Problem Relation Age of Onset  . Hypertension Mother   . Hypertension Father   . Hypertension Sister   . Asthma Brother   . Diabetes Maternal Grandmother   . Hypertension Maternal Grandmother   . Hypertension Maternal Grandfather   . Cancer Paternal Grandmother     lung  . Emphysema Paternal Grandfather   . Allergies Mother    History  Substance Use Topics  . Smoking status: Former Smoker -- 0.50 packs/day for 4 years    Types: Cigarettes    Start date: 07/24/2014  . Smokeless tobacco: Never Used  . Alcohol Use: No   OB History    No data available     Review of Systems  Constitutional: Negative for fever, chills and fatigue.   Gastrointestinal: Positive for abdominal pain and constipation. Negative for nausea, vomiting and diarrhea.  Genitourinary: Negative for dysuria, urgency, frequency, hematuria and difficulty urinating.  Musculoskeletal: Negative for back pain.  All other systems reviewed and are negative.     Allergies  Other and Penicillins  Home Medications   Prior to Admission medications   Medication Sig Start Date End Date Taking? Authorizing Provider  Aspirin-Acetaminophen-Caffeine (GOODY HEADACHE PO) Take 1 packet by mouth every 6 (six) hours as needed (pain).   Yes Historical Provider, MD  beclomethasone (QVAR) 80 MCG/ACT inhaler Inhale 2 puffs into the lungs 2 (two) times daily. 05/02/14  Yes Midge Minium, MD  furosemide (LASIX) 20 MG tablet Take 1 tablet (20 mg total) by mouth daily. X 2 wks Patient taking differently: Take 20 mg by mouth daily.  10/27/14 10/27/15 Yes Rigoberto Noel, MD  naproxen (NAPROSYN) 500 MG tablet Take 1 tablet (500 mg total) by mouth 2 (two) times daily with a meal. Patient taking differently: Take 500 mg by mouth 2 (two) times daily as needed.  05/02/14 05/02/15 Yes Midge Minium, MD  PROAIR HFA 108 (90 BASE) MCG/ACT inhaler INHALE 2 PUFFS INTO THE LUNGS EVERY 6 HOURS AS NEEDED FOR WHEEZING OR SHORTNESS OF BREATH 10/24/14  Yes Midge Minium, MD  albuterol (PROVENTIL) (2.5 MG/3ML) 0.083% nebulizer solution Take 3  mLs (2.5 mg total) by nebulization every 6 (six) hours as needed for wheezing or shortness of breath. 10/27/14   Rigoberto Noel, MD  ferrous sulfate 325 (65 FE) MG tablet Take 1 tablet (325 mg total) by mouth daily with breakfast. Patient not taking: Reported on 11/11/2014 05/04/14   Midge Minium, MD  polyethylene glycol (MIRALAX / Floria Raveling) packet Take 17 g by mouth daily. 11/12/14   Shaili Donalson A Forcucci, PA-C   BP 121/69 mmHg  Pulse 95  Temp(Src) 99.5 F (37.5 C) (Oral)  Resp 16  SpO2 99%  LMP 10/20/2014 Physical Exam  Constitutional: She is  oriented to person, place, and time. She appears well-developed and well-nourished. No distress.  HENT:  Head: Normocephalic and atraumatic.  Mouth/Throat: Oropharynx is clear and moist. No oropharyngeal exudate.  Eyes: Conjunctivae and EOM are normal. Pupils are equal, round, and reactive to light. No scleral icterus.  Neck: Normal range of motion. Neck supple. No JVD present. No thyromegaly present.  Cardiovascular: Normal rate, regular rhythm, normal heart sounds and intact distal pulses.  Exam reveals no gallop and no friction rub.   No murmur heard. Pulmonary/Chest: Effort normal and breath sounds normal. No respiratory distress. She has no wheezes. She has no rales. She exhibits no tenderness.  Abdominal: Soft. Bowel sounds are normal. She exhibits no distension and no mass. There is no tenderness. There is no rebound and no guarding.  Morbidly obese abdomen  Genitourinary: Rectal exam shows external hemorrhoid. Rectal exam shows no internal hemorrhoid, no fissure, no mass, no tenderness and anal tone normal. Guaiac positive stool.  Soft brown stool in the rectal vault with some blood on the glove. There are 2 external hemorrhoids. No palpable internal hemorrhoids.  Musculoskeletal: Normal range of motion.  Lymphadenopathy:    She has no cervical adenopathy.  Neurological: She is alert and oriented to person, place, and time.  Skin: Skin is warm and dry. She is not diaphoretic.  Psychiatric: She has a normal mood and affect. Her behavior is normal. Judgment and thought content normal.  Nursing note and vitals reviewed.   ED Course  Procedures (including critical care time) Labs Review Labs Reviewed  CBC WITH DIFFERENTIAL/PLATELET - Abnormal; Notable for the following:    RBC 5.30 (*)    Hemoglobin 9.0 (*)    HCT 35.0 (*)    MCV 66.0 (*)    MCH 17.0 (*)    MCHC 25.7 (*)    RDW 21.1 (*)    Monocytes Relative 15 (*)    All other components within normal limits  URINALYSIS,  ROUTINE W REFLEX MICROSCOPIC - Abnormal; Notable for the following:    Color, Urine AMBER (*)    APPearance CLOUDY (*)    Bilirubin Urine SMALL (*)    Urobilinogen, UA 4.0 (*)    All other components within normal limits  POC OCCULT BLOOD, ED - Abnormal; Notable for the following:    Fecal Occult Bld POSITIVE (*)    All other components within normal limits  COMPREHENSIVE METABOLIC PANEL  LIPASE, BLOOD    Imaging Review Dg Abd 1 View  11/12/2014   CLINICAL DATA:  Lower and RIGHT abdominal pain and constipation for several days.  EXAM: ABDOMEN - 1 VIEW  COMPARISON:  Acute abdominal series March 12, 2013  FINDINGS: The bowel gas pattern is normal. Mild amount of retained large bowel stool. No radio-opaque calculi or other significant radiographic abnormality are seen. Large body habitus.  IMPRESSION: Normal bowel gas  pattern. Mild amount of retained large bowel stool.   Electronically Signed   By: Elon Alas   On: 11/12/2014 00:34     EKG Interpretation None      MDM   Final diagnoses:  Constipation, unspecified constipation type  Anemia, unspecified anemia type   Patient is a 34 year old female who presents emergency room for evaluation of abdominal pain and constipation. Physical exam reveals a soft and nontender abdomen with no evidence for guarding, masses, rigidity, or peritoneal signs. Rectal exam reveals visible external hemorrhoids, and some blood. There is no impaction in the rectum. Abdominal x-ray reveals constipation with no evidence for obstruction. CBC reveals anemia which appears to be improving. CMP unremarkable. Lipase negative. UA is negative. Discharge the patient home with MiraLAX and will have her follow-up with her PCP.  Patient discussed with Dr. Leonides Schanz who agrees with the above workup and plan.    Cherylann Parr, PA-C 11/12/14 [place], DO 11/12/14 [phone]

## 2014-11-12 NOTE — Discharge Instructions (Signed)
Hemorrhoids Hemorrhoids are swollen veins around the rectum or anus. There are two types of hemorrhoids:   Internal hemorrhoids. These occur in the veins just inside the rectum. They may poke through to the outside and become irritated and painful.  External hemorrhoids. These occur in the veins outside the anus and can be felt as a painful swelling or hard lump near the anus. CAUSES  Pregnancy.   Obesity.   Constipation or diarrhea.   Straining to have a bowel movement.   Sitting for long periods on the toilet.  Heavy lifting or other activity that caused you to strain.  Anal intercourse. SYMPTOMS   Pain.   Anal itching or irritation.   Rectal bleeding.   Fecal leakage.   Anal swelling.   One or more lumps around the anus.  DIAGNOSIS  Your caregiver may be able to diagnose hemorrhoids by visual examination. Other examinations or tests that may be performed include:   Examination of the rectal area with a gloved hand (digital rectal exam).   Examination of anal canal using a small tube (scope).   A blood test if you have lost a significant amount of blood.  A test to look inside the colon (sigmoidoscopy or colonoscopy). TREATMENT Most hemorrhoids can be treated at home. However, if symptoms do not seem to be getting better or if you have a lot of rectal bleeding, your caregiver may perform a procedure to help make the hemorrhoids get smaller or remove them completely. Possible treatments include:   Placing a rubber band at the base of the hemorrhoid to cut off the circulation (rubber band ligation).   Injecting a chemical to shrink the hemorrhoid (sclerotherapy).   Using a tool to burn the hemorrhoid (infrared light therapy).   Surgically removing the hemorrhoid (hemorrhoidectomy).   Stapling the hemorrhoid to block blood flow to the tissue (hemorrhoid stapling).  HOME CARE INSTRUCTIONS   Eat foods with fiber, such as whole grains, beans,  nuts, fruits, and vegetables. Ask your doctor about taking products with added fiber in them (fibersupplements).  Increase fluid intake. Drink enough water and fluids to keep your urine clear or pale yellow.   Exercise regularly.   Go to the bathroom when you have the urge to have a bowel movement. Do not wait.   Avoid straining to have bowel movements.   Keep the anal area dry and clean. Use wet toilet paper or moist towelettes after a bowel movement.   Medicated creams and suppositories may be used or applied as directed.   Only take over-the-counter or prescription medicines as directed by your caregiver.   Take warm sitz baths for 15-20 minutes, 3-4 times a day to ease pain and discomfort.   Place ice packs on the hemorrhoids if they are tender and swollen. Using ice packs between sitz baths may be helpful.   Put ice in a plastic bag.   Place a towel between your skin and the bag.   Leave the ice on for 15-20 minutes, 3-4 times a day.   Do not use a donut-shaped pillow or sit on the toilet for long periods. This increases blood pooling and pain.  SEEK MEDICAL CARE IF:  You have increasing pain and swelling that is not controlled by treatment or medicine.  You have uncontrolled bleeding.  You have difficulty or you are unable to have a bowel movement.  You have pain or inflammation outside the area of the hemorrhoids. MAKE SURE YOU:  Understand these instructions.  Will watch your condition.  Will get help right away if you are not doing well or get worse. Document Released: 09/06/2000 Document Revised: 08/26/2012 Document Reviewed: 07/14/2012 [facility] Patient Information 2015 [place], [place]. This information is not intended to replace advice given to you by your health care provider. Make sure you discuss any questions you have with your health care provider.  Constipation Constipation is when a person has fewer than three bowel movements a week, has  difficulty having a bowel movement, or has stools that are dry, hard, or larger than normal. As people grow older, constipation is more common. If you try to fix constipation with medicines that make you have a bowel movement (laxatives), the problem may get worse. Long-term laxative use may cause the muscles of the colon to become weak. A low-fiber diet, not taking in enough fluids, and taking certain medicines may make constipation worse.  CAUSES   Certain medicines, such as antidepressants, pain medicine, iron supplements, antacids, and water pills.   Certain diseases, such as diabetes, irritable bowel syndrome (IBS), thyroid disease, or depression.   Not drinking enough water.   Not eating enough fiber-rich foods.   Stress or travel.   Lack of physical activity or exercise.   Ignoring the urge to have a bowel movement.   Using laxatives too much.  SIGNS AND SYMPTOMS   Having fewer than three bowel movements a week.   Straining to have a bowel movement.   Having stools that are hard, dry, or larger than normal.   Feeling full or bloated.   Pain in the lower abdomen.   Not feeling relief after having a bowel movement.  DIAGNOSIS  Your health care provider will take a medical history and perform a physical exam. Further testing may be done for severe constipation. Some tests may include:  A barium enema X-ray to examine your rectum, colon, and, sometimes, your small intestine.   A sigmoidoscopy to examine your lower colon.   A colonoscopy to examine your entire colon. TREATMENT  Treatment will depend on the severity of your constipation and what is causing it. Some dietary treatments include drinking more fluids and eating more fiber-rich foods. Lifestyle treatments may include regular exercise. If these diet and lifestyle recommendations do not help, your health care provider may recommend taking over-the-counter laxative medicines to help you have bowel  movements. Prescription medicines may be prescribed if over-the-counter medicines do not work.  HOME CARE INSTRUCTIONS   Eat foods that have a lot of fiber, such as fruits, vegetables, whole grains, and beans.  Limit foods high in fat and processed sugars, such as french fries, hamburgers, cookies, candies, and soda.   A fiber supplement may be added to your diet if you cannot get enough fiber from foods.   Drink enough fluids to keep your urine clear or pale yellow.   Exercise regularly or as directed by your health care provider.   Go to the restroom when you have the urge to go. Do not hold it.   Only take over-the-counter or prescription medicines as directed by your health care provider. Do not take other medicines for constipation without talking to your health care provider first.  [place] IF:   You have bright red blood in your stool.   Your constipation lasts for more than 4 days or gets worse.   You have abdominal or rectal pain.   You have thin, pencil-like stools.   You have unexplained weight loss.  MAKE SURE YOU:   Understand these instructions.  Will watch your condition.  Will get help right away if you are not doing well or get worse. Document Released: 06/07/2004 Document Revised: 09/14/2013 Document Reviewed: 06/21/2013 [facility] Patient Information 2015 [place], [place]. This information is not intended to replace advice given to you by your health care provider. Make sure you discuss any questions you have with your health care provider.

## 2014-11-14 NOTE — Telephone Encounter (Signed)
LMTCB x2  

## 2014-11-15 NOTE — Telephone Encounter (Signed)
lmtcb for pt.  

## 2014-11-16 ENCOUNTER — Inpatient Hospital Stay (HOSPITAL_COMMUNITY)
Admission: EM | Admit: 2014-11-16 | Discharge: 2014-11-23 | DRG: 392 | Disposition: A | Discharge status: Home / Self Care | Payer: Medicaid Other | Attending: Internal Medicine | Admitting: Internal Medicine

## 2014-11-16 ENCOUNTER — Emergency Department (HOSPITAL_COMMUNITY): Payer: Medicaid Other

## 2014-11-16 ENCOUNTER — Inpatient Hospital Stay (HOSPITAL_COMMUNITY): Payer: Medicaid Other

## 2014-11-16 ENCOUNTER — Encounter (HOSPITAL_COMMUNITY): Payer: Self-pay | Admitting: Emergency Medicine

## 2014-11-16 DIAGNOSIS — Z87891 Personal history of nicotine dependence: Secondary | ICD-10-CM

## 2014-11-16 DIAGNOSIS — I959 Hypotension, unspecified: Secondary | ICD-10-CM | POA: Diagnosis present

## 2014-11-16 DIAGNOSIS — I5032 Chronic diastolic (congestive) heart failure: Secondary | ICD-10-CM | POA: Diagnosis present

## 2014-11-16 DIAGNOSIS — E662 Morbid (severe) obesity with alveolar hypoventilation: Secondary | ICD-10-CM | POA: Diagnosis present

## 2014-11-16 DIAGNOSIS — K810 Acute cholecystitis: Secondary | ICD-10-CM | POA: Diagnosis present

## 2014-11-16 DIAGNOSIS — R112 Nausea with vomiting, unspecified: Secondary | ICD-10-CM

## 2014-11-16 DIAGNOSIS — R109 Unspecified abdominal pain: Secondary | ICD-10-CM | POA: Insufficient documentation

## 2014-11-16 DIAGNOSIS — J45909 Unspecified asthma, uncomplicated: Secondary | ICD-10-CM | POA: Diagnosis present

## 2014-11-16 DIAGNOSIS — Z880 Allergy status to penicillin: Secondary | ICD-10-CM

## 2014-11-16 DIAGNOSIS — Z6841 Body Mass Index (BMI) 40.0 and over, adult: Secondary | ICD-10-CM | POA: Diagnosis not present

## 2014-11-16 DIAGNOSIS — D649 Anemia, unspecified: Secondary | ICD-10-CM | POA: Diagnosis present

## 2014-11-16 DIAGNOSIS — K5732 Diverticulitis of large intestine without perforation or abscess without bleeding: Secondary | ICD-10-CM | POA: Diagnosis present

## 2014-11-16 DIAGNOSIS — R945 Abnormal results of liver function studies: Secondary | ICD-10-CM

## 2014-11-16 DIAGNOSIS — R1010 Upper abdominal pain, unspecified: Secondary | ICD-10-CM

## 2014-11-16 DIAGNOSIS — R7989 Other specified abnormal findings of blood chemistry: Secondary | ICD-10-CM | POA: Diagnosis present

## 2014-11-16 DIAGNOSIS — K8020 Calculus of gallbladder without cholecystitis without obstruction: Secondary | ICD-10-CM

## 2014-11-16 DIAGNOSIS — K819 Cholecystitis, unspecified: Secondary | ICD-10-CM

## 2014-11-16 DIAGNOSIS — K760 Fatty (change of) liver, not elsewhere classified: Secondary | ICD-10-CM | POA: Diagnosis present

## 2014-11-16 DIAGNOSIS — K625 Hemorrhage of anus and rectum: Secondary | ICD-10-CM | POA: Diagnosis present

## 2014-11-16 DIAGNOSIS — Z91018 Allergy to other foods: Secondary | ICD-10-CM | POA: Diagnosis not present

## 2014-11-16 DIAGNOSIS — W19XXXA Unspecified fall, initial encounter: Secondary | ICD-10-CM

## 2014-11-16 DIAGNOSIS — I951 Orthostatic hypotension: Secondary | ICD-10-CM | POA: Diagnosis not present

## 2014-11-16 DIAGNOSIS — G4733 Obstructive sleep apnea (adult) (pediatric): Secondary | ICD-10-CM | POA: Diagnosis present

## 2014-11-16 HISTORY — DX: Other seasonal allergic rhinitis: J30.2

## 2014-11-16 HISTORY — DX: Obstructive sleep apnea (adult) (pediatric): G47.33

## 2014-11-16 HISTORY — DX: Morbid (severe) obesity due to excess calories: E66.01

## 2014-11-16 HISTORY — DX: Diverticulitis of large intestine without perforation or abscess without bleeding: K57.32

## 2014-11-16 HISTORY — DX: Acute cholecystitis: K81.0

## 2014-11-16 HISTORY — DX: Iodine-deficiency related diffuse (endemic) goiter: E01.0

## 2014-11-16 HISTORY — DX: Fatty (change of) liver, not elsewhere classified: K76.0

## 2014-11-16 LAB — URINALYSIS, ROUTINE W REFLEX MICROSCOPIC
Glucose, UA: NEGATIVE mg/dL
Ketones, ur: NEGATIVE mg/dL
Nitrite: NEGATIVE
PH: 5.5 (ref 5.0–8.0)
Protein, ur: NEGATIVE mg/dL
Specific Gravity, Urine: 1.018 (ref 1.005–1.030)
Urobilinogen, UA: 2 mg/dL — ABNORMAL HIGH (ref 0.0–1.0)

## 2014-11-16 LAB — COMPREHENSIVE METABOLIC PANEL
ALK PHOS: 197 U/L — AB (ref 39–117)
ALT: 103 U/L — ABNORMAL HIGH (ref 0–35)
AST: 103 U/L — ABNORMAL HIGH (ref 0–37)
Albumin: 3 g/dL — ABNORMAL LOW (ref 3.5–5.2)
Anion gap: 6 (ref 5–15)
BILIRUBIN TOTAL: 4.1 mg/dL — AB (ref 0.3–1.2)
BUN: 33 mg/dL — ABNORMAL HIGH (ref 6–23)
CO2: 30 mmol/L (ref 19–32)
CREATININE: 0.87 mg/dL (ref 0.50–1.10)
Calcium: 8.4 mg/dL (ref 8.4–10.5)
Chloride: 101 mmol/L (ref 96–112)
GFR calc Af Amer: >90 mL/min (ref >90)
GFR calc non Af Amer: 86 mL/min — ABNORMAL LOW (ref >90)
Glucose, Bld: 102 mg/dL — ABNORMAL HIGH (ref 70–99)
POTASSIUM: 4.7 mmol/L (ref 3.5–5.1)
SODIUM: 137 mmol/L (ref 135–145)
Total Protein: 6.9 g/dL (ref 6.0–8.3)

## 2014-11-16 LAB — CBC WITH DIFFERENTIAL/PLATELET
Basophils Absolute: 0.1 10*3/uL (ref 0.0–0.1)
Basophils Relative: 1 % (ref 0–1)
Eosinophils Absolute: 0.1 10*3/uL (ref 0.0–0.7)
Eosinophils Relative: 1 % (ref 0–5)
HCT: 37.3 % (ref 36.0–46.0)
Hemoglobin: 9.8 g/dL — ABNORMAL LOW (ref 12.0–15.0)
LYMPHS ABS: 1.2 10*3/uL (ref 0.7–4.0)
Lymphocytes Relative: 16 % (ref 12–46)
MCH: 16.8 pg — ABNORMAL LOW (ref 26.0–34.0)
MCHC: 26.3 g/dL — AB (ref 30.0–36.0)
MCV: 64.1 fL — AB (ref 78.0–100.0)
Monocytes Absolute: 0.9 10*3/uL (ref 0.1–1.0)
Monocytes Relative: 12 % (ref 3–12)
Neutro Abs: 5.5 10*3/uL (ref 1.7–7.7)
Neutrophils Relative %: 70 % (ref 43–77)
PLATELETS: 155 10*3/uL (ref 150–400)
RBC: 5.82 MIL/uL — ABNORMAL HIGH (ref 3.87–5.11)
RDW: 21.9 % — AB (ref 11.5–15.5)
WBC Morphology: INCREASED
WBC: 7.8 10*3/uL (ref 4.0–10.5)

## 2014-11-16 LAB — URINE MICROSCOPIC-ADD ON

## 2014-11-16 LAB — LIPASE, BLOOD

## 2014-11-16 LAB — I-STAT CG4 LACTIC ACID, ED: Lactic Acid, Venous: 1.73 mmol/L (ref 0.5–2.0)

## 2014-11-16 LAB — PREGNANCY, URINE: Preg Test, Ur: NEGATIVE

## 2014-11-16 MED ORDER — HYDROMORPHONE HCL 1 MG/ML IJ SOLN
1.0000 mg | Freq: Once | INTRAMUSCULAR | Status: AC
  Administered 2014-11-16: 1 mg via INTRAVENOUS
  Filled 2014-11-16: qty 1

## 2014-11-16 MED ORDER — SODIUM CHLORIDE 0.9 % IV SOLN
INTRAVENOUS | Status: DC
  Administered 2014-11-17 – 2014-11-18 (×2): 50 mL via INTRAVENOUS
  Administered 2014-11-19 (×2): via INTRAVENOUS

## 2014-11-16 MED ORDER — ALBUTEROL SULFATE (2.5 MG/3ML) 0.083% IN NEBU: 2.5000 mg | INHALATION_SOLUTION | RESPIRATORY_TRACT | Status: DC | PRN

## 2014-11-16 MED ORDER — IOHEXOL 300 MG/ML  SOLN: 50.0000 mL | Freq: Once | INTRAMUSCULAR | Status: AC | PRN

## 2014-11-16 MED ORDER — ONDANSETRON HCL 4 MG/2ML IJ SOLN: 4.0000 mg | Freq: Four times a day (QID) | INTRAMUSCULAR | Status: DC | PRN

## 2014-11-16 MED ORDER — DEXTROSE 5 % IV SOLN
1.0000 g | Freq: Once | INTRAVENOUS | Status: AC
  Administered 2014-11-16: 1 g via INTRAVENOUS
  Filled 2014-11-16: qty 10

## 2014-11-16 MED ORDER — METRONIDAZOLE IN NACL 5-0.79 MG/ML-% IV SOLN
500.0000 mg | Freq: Once | INTRAVENOUS | Status: AC
  Administered 2014-11-16: 500 mg via INTRAVENOUS
  Filled 2014-11-16: qty 100

## 2014-11-16 MED ORDER — IOHEXOL 300 MG/ML  SOLN: 100.0000 mL | Freq: Once | INTRAMUSCULAR | Status: AC | PRN

## 2014-11-16 MED ORDER — METRONIDAZOLE IN NACL 5-0.79 MG/ML-% IV SOLN
500.0000 mg | Freq: Three times a day (TID) | INTRAVENOUS | Status: DC
  Administered 2014-11-17 – 2014-11-18 (×3): 500 mg via INTRAVENOUS
  Filled 2014-11-16 (×5): qty 100

## 2014-11-16 MED ORDER — ALBUTEROL SULFATE HFA 108 (90 BASE) MCG/ACT IN AERS: 2.0000 | INHALATION_SPRAY | RESPIRATORY_TRACT | Status: DC | PRN

## 2014-11-16 MED ORDER — OXYCODONE HCL 5 MG PO TABS
5.0000 mg | ORAL_TABLET | ORAL | Status: DC | PRN
  Administered 2014-11-17 – 2014-11-18 (×3): 5 mg via ORAL
  Filled 2014-11-16 (×3): qty 1

## 2014-11-16 MED ORDER — HEPARIN SODIUM (PORCINE) 5000 UNIT/ML IJ SOLN
5000.0000 [IU] | Freq: Three times a day (TID) | INTRAMUSCULAR | Status: DC
  Administered 2014-11-17 – 2014-11-21 (×12): 5000 [IU] via SUBCUTANEOUS
  Filled 2014-11-16 (×17): qty 1

## 2014-11-16 MED ORDER — ONDANSETRON HCL 4 MG PO TABS: 4.0000 mg | ORAL_TABLET | Freq: Four times a day (QID) | ORAL | Status: DC | PRN

## 2014-11-16 MED ORDER — ONDANSETRON HCL 4 MG/2ML IJ SOLN
4.0000 mg | Freq: Once | INTRAMUSCULAR | Status: AC
  Administered 2014-11-16: 4 mg via INTRAVENOUS
  Filled 2014-11-16: qty 2

## 2014-11-16 MED ORDER — ALBUTEROL SULFATE (2.5 MG/3ML) 0.083% IN NEBU: 2.5000 mg | INHALATION_SOLUTION | Freq: Four times a day (QID) | RESPIRATORY_TRACT | Status: DC | PRN

## 2014-11-16 MED ORDER — SODIUM CHLORIDE 0.9 % IV BOLUS (SEPSIS)
1000.0000 mL | Freq: Once | INTRAVENOUS | Status: AC
  Administered 2014-11-16: 1000 mL via INTRAVENOUS

## 2014-11-16 MED ORDER — CIPROFLOXACIN IN D5W 400 MG/200ML IV SOLN
400.0000 mg | Freq: Two times a day (BID) | INTRAVENOUS | Status: DC
  Administered 2014-11-17 – 2014-11-18 (×2): 400 mg via INTRAVENOUS
  Filled 2014-11-16 (×4): qty 200

## 2014-11-16 MED ORDER — FLUTICASONE PROPIONATE HFA 44 MCG/ACT IN AERO
2.0000 | INHALATION_SPRAY | Freq: Two times a day (BID) | RESPIRATORY_TRACT | Status: DC
  Administered 2014-11-17 – 2014-11-23 (×13): 2 via RESPIRATORY_TRACT
  Filled 2014-11-16: qty 10.6

## 2014-11-16 NOTE — ED Notes (Signed)
Pt gave urine sample but pt is currently on her menstral cycle and the sample is bloody.  RN notified.

## 2014-11-16 NOTE — H&P (Signed)
[facility] History and Physical  Patient: Dominique [name]  MRN: [id number]  DOB: 07/07/1981  DOS: the patient was seen and examined on 11/16/2014 PCP: Annye Asa, MD  Chief Complaint: Abdominal pain  HPI: [name] is a 34 y.o. female with Past medical history of morbid obesity, asthma, possible sleep apnea, chronic anemia. The patient presented with numbness of abdominal pain. She mentions the pain has been ongoing for last 3-4 weeks. Patient has on and off constipation. She denies any active bleeding at present but did have some blood when she was wiping. She denies any fever or chills denies any nausea or vomiting. She mentions the pain is located in the upper area and feels like a crampy pain. The pain has nausea with food. Patient was given stool softener recently and that despite taking that he does not need any change in her bowel movement and has not made any change in her pain as well. No shortness of breath no cough no focal deficit no burning urination.  The patient is coming from home And at her baseline independent for most of her ADL.  Review of Systems: as mentioned in the history of present illness.  A Comprehensive review of the other systems is negative.  Past Medical History  Diagnosis Date  . Asthma   . Sleep apnea   . Anemia    Past Surgical History  Procedure Laterality Date  . C section 2011  2011   Social History:  reports that she has quit smoking. Her smoking use included Cigarettes. She started smoking about 3 months ago. She has a 2 pack-year smoking history. She has never used smokeless tobacco. She reports that she does not drink alcohol or use illicit drugs.  Allergies  Allergen Reactions  . Other Shortness Of Breath and Swelling    Tree nuts  . Penicillins     Unknown childhood allergy    Family History  Problem Relation Age of Onset  . Hypertension Mother   . Hypertension Father   . Hypertension Sister   . Asthma Brother    . Diabetes Maternal Grandmother   . Hypertension Maternal Grandmother   . Hypertension Maternal Grandfather   . Cancer Paternal Grandmother     lung  . Emphysema Paternal Grandfather   . Allergies Mother     Prior to Admission medications   Medication Sig Start Date End Date Taking? Authorizing Provider  albuterol (PROVENTIL) (2.5 MG/3ML) 0.083% nebulizer solution Take 3 mLs (2.5 mg total) by nebulization every 6 (six) hours as needed for wheezing or shortness of breath. 10/27/14  Yes Rigoberto Noel, MD  beclomethasone (QVAR) 80 MCG/ACT inhaler Inhale 2 puffs into the lungs 2 (two) times daily. 05/02/14  Yes Midge Minium, MD  naproxen (NAPROSYN) 500 MG tablet Take 1 tablet (500 mg total) by mouth 2 (two) times daily with a meal. Patient taking differently: Take 500 mg by mouth 2 (two) times daily as needed for mild pain.  05/02/14 05/02/15 Yes Midge Minium, MD  naproxen sodium (ANAPROX) 220 MG tablet Take 220 mg by mouth 2 (two) times daily with a meal.   Yes Historical Provider, MD  PROAIR HFA 108 (90 BASE) MCG/ACT inhaler INHALE 2 PUFFS INTO THE LUNGS EVERY 6 HOURS AS NEEDED FOR WHEEZING OR SHORTNESS OF BREATH Patient taking differently: Inhale 2 puffs into the lungs every 6 hours as needed for wheezing or shortness of breath. 10/24/14  Yes Midge Minium, MD  Aspirin-Acetaminophen-Caffeine (GOODY HEADACHE PO)  Take 1 packet by mouth every 6 (six) hours as needed (pain).    Historical Provider, MD  ferrous sulfate 325 (65 FE) MG tablet Take 1 tablet (325 mg total) by mouth daily with breakfast. Patient not taking: Reported on 11/11/2014 05/04/14   Midge Minium, MD  furosemide (LASIX) 20 MG tablet Take 1 tablet (20 mg total) by mouth daily. X 2 wks Patient taking differently: Take 20 mg by mouth daily.  10/27/14 10/27/15  Rigoberto Noel, MD  polyethylene glycol (MIRALAX / GLYCOLAX) packet Take 17 g by mouth daily. Patient not taking: Reported on 11/16/2014 11/12/14   Cherylann Parr, PA-C    Physical Exam: Filed Vitals:   11/16/14 1957 11/16/14 1957 11/16/14 2144 11/16/14 2252  BP: 118/81  129/58 138/65  Pulse: 102  111 106  Temp:    98.3 F (36.8 C)  TempSrc:    Oral  Resp: 22  18 18   SpO2: 99% 98% 93% 98%    General: Alert, Awake and Oriented to Time, Place and Person. Appear in  distress Eyes: PERRL ENT: Oral Mucosa clear moist. Neck: no JVD Cardiovascular: S1 and S2 Present, no Murmur, Peripheral Pulses Present Respiratory: Bilateral Air entry equal and Decreased, Clear to Auscultation, nCrackles, ono wheezes Abdomen: Bowel Sound present, Soft and mildly tender Skin: no Rash Extremities: no Pedal edema, no calf tenderness Neurologic: Grossly no focal neuro deficit.  Labs on Admission:  CBC:  Recent Labs Lab 11/11/14 2356 11/16/14 1725  WBC 6.3 7.8  NEUTROABS 3.8 5.5  HGB 9.0* 9.8*  HCT 35.0* 37.3  MCV 66.0* 64.1*  PLT PLATELET CLUMPS NOTED ON SMEAR, COUNT APPEARS ADEQUATE 155    CMP     Component Value Date/Time   NA 137 11/16/2014 1725   K 4.7 11/16/2014 1725   CL 101 11/16/2014 1725   CO2 30 11/16/2014 1725   GLUCOSE 102* 11/16/2014 1725   BUN 33* 11/16/2014 1725   CREATININE 0.87 11/16/2014 1725   CALCIUM 8.4 11/16/2014 1725   PROT 6.9 11/16/2014 1725   ALBUMIN 3.0* 11/16/2014 1725   AST 103* 11/16/2014 1725   ALT 103* 11/16/2014 1725   ALKPHOS 197* 11/16/2014 1725   BILITOT 4.1* 11/16/2014 1725   GFRNONAA 86* 11/16/2014 1725   GFRAA >90 11/16/2014 1725     Recent Labs Lab 11/11/14 2356 11/16/14 1725  LIPASE 17 <10*    No results for input(s): CKTOTAL, CKMB, CKMBINDEX, TROPONINI in the last 168 hours. BNP (last 3 results) No results for input(s): BNP in the last 8760 hours.  ProBNP (last 3 results) No results for input(s): PROBNP in the last 8760 hours.   Radiological Exams on Admission: US Abdomen Complete  11/16/2014   CLINICAL DATA:  34 year old female with general abdominal pain for 1 month.  History of constipation. Nausea and vomiting with pain for 5 days.  EXAM: ULTRASOUND ABDOMEN COMPLETE  COMPARISON:  Plain film 11/12/2014, ultrasound 03/12/2013  FINDINGS: Gallbladder: Anechoic fluid within the gallbladder lumen with no stones identified. Gallbladder wall thickening measuring as great as 2 mm to 3 mm. Sonographic Murphy's sign positive.  Common bile duct: Diameter: 4.6 mm  Liver: Heterogeneous echotexture of liver parenchyma.  IVC: No abnormality visualized.  Pancreas: Visualized portion unremarkable.  Spleen: Size and appearance within normal limits.  Right Kidney: Length: 13.1 cm. Echogenicity within normal limits. No mass or hydronephrosis visualized.  Left Kidney: Length: 13.3 cm. Echogenicity within normal limits. No mass or hydronephrosis visualized.  Abdominal aorta: No aneurysm  visualized.  Other findings: None.  IMPRESSION: Sonographic survey of the abdomen demonstrates evidence of acalculous cholecystitis with positive sonographic Murphy sign and circumferential gallbladder wall thickening/striations. There is an absence of gallbladder stones/sludge. If there is concern for confirming ductal obstruction, correlation with HIDA study may be useful.  Evidence of hepatic steatosis.  Signed,  Dulcy Fanny. Earleen Newport, DO  [facility]  [facility]   Electronically Signed   By: Corrie Mckusick D.O.   On: 11/16/2014 20:38    Assessment/Plan Principal Problem:   Acute acalculous cholecystitis Active Problems:   Morbid obesity   OSA (obstructive sleep apnea)   1. Acute acalculous cholecystitis  The patient is presenting with complaints of abdominal pain. An ultrasound today confirms a calculus cholecystitis. General surgery was consulted who will be following up with the patient. The patient will be treated with Cipro and Flagyl. A CT scan is recommended by surgery will follow findings.  2. Morbid obesity. Sleep apnea. Oxygen as needed.  3.  History of diastolic heart failure. Patient is on Lasix. Does not appear volume overloaded. Currently holding.  4. Asthma. Does not appear necessary relation. Continue inhalers.  Advance goals of care discussion: Full code   Consults: Gen. surgery  DVT Prophylaxis: subcutaneous Heparin Nutrition: Nothing by mouth except medications  Disposition: Admitted to inpatient in med-surge unit.  Author: Berle Mull, MD [facility] Pager: [phone] 11/16/2014, 10:56 PM    If 7PM-7AM, please contact night-coverage [email or web address] Password [email or web address]

## 2014-11-16 NOTE — ED Provider Notes (Signed)
CSN: [id number]     Arrival date & time 11/16/14  1704 History   First MD Initiated Contact with Patient 11/16/14 1841     Chief Complaint  Patient presents with  . Abdominal Pain     (Consider location/radiation/quality/duration/timing/severity/associated sxs/prior Treatment) HPI Comments: Patient with history of cesarian section -- presents with complaint of abdominal pain, persistent nausea and vomiting when eating for 5 days. Patient was seen in emergency department on 2/19 for constipation. At that time she had normal labs and a negative x-ray. She was discharged home on laxative medications. Patient states that since that time her pain has become worse and is over the middle part of her abdomen. She is unable to keep down solids and liquids. She denies fever but has had chills. No chest pain or shortness of breath. No urinary symptoms. She is currently on her menstrual period. Patient's has had one bowel movement in the past several days but is otherwise constipated.  The history is provided by the patient and medical records.    Past Medical History  Diagnosis Date  . Asthma   . Sleep apnea   . Anemia    Past Surgical History  Procedure Laterality Date  . C section 2011  2011   Family History  Problem Relation Age of Onset  . Hypertension Mother   . Hypertension Father   . Hypertension Sister   . Asthma Brother   . Diabetes Maternal Grandmother   . Hypertension Maternal Grandmother   . Hypertension Maternal Grandfather   . Cancer Paternal Grandmother     lung  . Emphysema Paternal Grandfather   . Allergies Mother    History  Substance Use Topics  . Smoking status: Former Smoker -- 0.50 packs/day for 4 years    Types: Cigarettes    Start date: 07/24/2014  . Smokeless tobacco: Never Used  . Alcohol Use: No   OB History    No data available     Review of Systems  Constitutional: Negative for fever.  HENT: Negative for rhinorrhea and sore throat.   Eyes:  Negative for redness.  Respiratory: Negative for cough.   Cardiovascular: Negative for chest pain.  Gastrointestinal: Positive for nausea, vomiting and abdominal pain. Negative for diarrhea.  Genitourinary: Negative for dysuria.  Musculoskeletal: Negative for myalgias.  Skin: Negative for rash.  Neurological: Negative for headaches.      Allergies  Other and Penicillins  Home Medications   Prior to Admission medications   Medication Sig Start Date End Date Taking? Authorizing Provider  albuterol (PROVENTIL) (2.5 MG/3ML) 0.083% nebulizer solution Take 3 mLs (2.5 mg total) by nebulization every 6 (six) hours as needed for wheezing or shortness of breath. 10/27/14   Rigoberto Noel, MD  Aspirin-Acetaminophen-Caffeine (GOODY HEADACHE PO) Take 1 packet by mouth every 6 (six) hours as needed (pain).    Historical Provider, MD  beclomethasone (QVAR) 80 MCG/ACT inhaler Inhale 2 puffs into the lungs 2 (two) times daily. 05/02/14   Midge Minium, MD  ferrous sulfate 325 (65 FE) MG tablet Take 1 tablet (325 mg total) by mouth daily with breakfast. Patient not taking: Reported on 11/11/2014 05/04/14   Midge Minium, MD  furosemide (LASIX) 20 MG tablet Take 1 tablet (20 mg total) by mouth daily. X 2 wks Patient taking differently: Take 20 mg by mouth daily.  10/27/14 10/27/15  Rigoberto Noel, MD  naproxen (NAPROSYN) 500 MG tablet Take 1 tablet (500 mg total) by mouth 2 (two)  times daily with a meal. Patient taking differently: Take 500 mg by mouth 2 (two) times daily as needed.  05/02/14 05/02/15  Midge Minium, MD  polyethylene glycol (MIRALAX / Floria Raveling) packet Take 17 g by mouth daily. 11/12/14   Courtney A Forcucci, PA-C  PROAIR HFA 108 (90 BASE) MCG/ACT inhaler INHALE 2 PUFFS INTO THE LUNGS EVERY 6 HOURS AS NEEDED FOR WHEEZING OR SHORTNESS OF BREATH 10/24/14   Midge Minium, MD   BP 101/73 mmHg  Pulse 117  Temp(Src) 97.9 F (36.6 C) (Oral)  Resp 23  SpO2 96%  LMP 10/20/2014    Physical Exam  Constitutional: She appears well-developed and well-nourished.  HENT:  Head: Normocephalic and atraumatic.  Mouth/Throat: Oropharynx is clear and moist.  Eyes: Conjunctivae are normal. Pupils are equal, round, and reactive to light. Right eye exhibits no discharge. Left eye exhibits no discharge.  Neck: Normal range of motion. Neck supple.  Cardiovascular: Normal rate, regular rhythm and normal heart sounds.   No murmur heard. Pulmonary/Chest: Effort normal and breath sounds normal. No respiratory distress. She has no wheezes. She has no rales.  Abdominal: Soft. Bowel sounds are normal. She exhibits no distension. There is tenderness in the right upper quadrant, epigastric area and periumbilical area. There is no rigidity, no rebound, no guarding and no tenderness at McBurney's point.  Patient is morbidly obese. Exam is very limited by patient's habitus. She is most tender in her right upper quadrant.   Musculoskeletal: She exhibits no edema or tenderness.  Neurological: She is alert.  Skin: Skin is warm and dry.  Psychiatric: She has a normal mood and affect.  Nursing note and vitals reviewed.   ED Course  Procedures (including critical care time) Labs Review Labs Reviewed  COMPREHENSIVE METABOLIC PANEL - Abnormal; Notable for the following:    Glucose, Bld 102 (*)    BUN 33 (*)    Albumin 3.0 (*)    AST 103 (*)    ALT 103 (*)    Alkaline Phosphatase 197 (*)    Total Bilirubin 4.1 (*)    GFR calc non Af Amer 86 (*)    All other components within normal limits  CBC WITH DIFFERENTIAL/PLATELET - Abnormal; Notable for the following:    RBC 5.82 (*)    Hemoglobin 9.8 (*)    MCV 64.1 (*)    MCH 16.8 (*)    MCHC 26.3 (*)    RDW 21.9 (*)    All other components within normal limits  LIPASE, BLOOD - Abnormal; Notable for the following:    Lipase <10 (*)    All other components within normal limits  URINALYSIS, ROUTINE W REFLEX MICROSCOPIC - Abnormal; Notable for  the following:    Color, Urine AMBER (*)    Hgb urine dipstick LARGE (*)    Bilirubin Urine SMALL (*)    Urobilinogen, UA 2.0 (*)    Leukocytes, UA SMALL (*)    All other components within normal limits  PREGNANCY, URINE  URINE MICROSCOPIC-ADD ON  I-STAT CG4 LACTIC ACID, ED    Imaging Review US Abdomen Complete  11/16/2014   CLINICAL DATA:  34 year old female with general abdominal pain for 1 month. History of constipation. Nausea and vomiting with pain for 5 days.  EXAM: ULTRASOUND ABDOMEN COMPLETE  COMPARISON:  Plain film 11/12/2014, ultrasound 03/12/2013  FINDINGS: Gallbladder: Anechoic fluid within the gallbladder lumen with no stones identified. Gallbladder wall thickening measuring as great as 2 mm to 3 mm. Sonographic  Murphy's sign positive.  Common bile duct: Diameter: 4.6 mm  Liver: Heterogeneous echotexture of liver parenchyma.  IVC: No abnormality visualized.  Pancreas: Visualized portion unremarkable.  Spleen: Size and appearance within normal limits.  Right Kidney: Length: 13.1 cm. Echogenicity within normal limits. No mass or hydronephrosis visualized.  Left Kidney: Length: 13.3 cm. Echogenicity within normal limits. No mass or hydronephrosis visualized.  Abdominal aorta: No aneurysm visualized.  Other findings: None.  IMPRESSION: Sonographic survey of the abdomen demonstrates evidence of acalculous cholecystitis with positive sonographic Murphy sign and circumferential gallbladder wall thickening/striations. There is an absence of gallbladder stones/sludge. If there is concern for confirming ductal obstruction, correlation with HIDA study may be useful.  Evidence of hepatic steatosis.  Signed,  Dulcy Fanny. Earleen Newport, DO  [facility]  [facility]   Electronically Signed   By: Corrie Mckusick D.O.   On: 11/16/2014 20:38     EKG Interpretation None       7:15 PM Patient seen and examined. Work-up initiated. Medications ordered.   Vital signs  reviewed and are as follows: BP 101/73 mmHg  Pulse 117  Temp(Src) 97.9 F (36.6 C) (Oral)  Resp 23  SpO2 96%  LMP 10/20/2014  Will try to obtain US to eval RUQ pain. Given tachycardia, bandemia, I have ordered antibiotics to cover intra-abdominal infection. She does not appear toxic. I was informed of O2 sat of 80% but I feel this is inaccurate. Patient is having no respiratory symptoms.   If [place] neg, will need to attempt CT.   8:54 PM [place] is suggestive of an acalculous cholecystitis. Discussed with Dr. Mingo Amber who will see. Will consult surgery.   BP 118/81 mmHg  Pulse 102  Temp(Src) 98.3 F (36.8 C) (Oral)  Resp 22  SpO2 98%  LMP 10/20/2014   9:28 PM Spoke with Dr. Hassell Done. He suggests additional work-up needs to be done to evaluate CBD and liver. Requests admit to medicine.   Spoke with Dr. Posey Pronto who will see.    MDM   Final diagnoses:  Abdominal pain  Acalculous cholecystitis  Non-intractable vomiting with nausea, vomiting of unspecified type   Admit.     Carlisle Cater, PA-C 11/16/14 2129  Evelina Bucy, MD 11/16/14 [phone]

## 2014-11-16 NOTE — ED Notes (Signed)
Patient on menses, notified PA Josh, will find out if need in and out.

## 2014-11-16 NOTE — ED Notes (Signed)
US at bedside

## 2014-11-16 NOTE — Consult Note (Signed)
Chief Complaint:  Abdominal pain and constipation  History of Present Illness:  Dominique [name] is an 34 y.o. female who was admitted tonight after ultrasound in the ER showed some GB wall thickness but no stones.  I was consulted and reviewed studies and have suggested a CT scan.  She is not a good historian as she appears to have OSA and was once fitted for a CPAP which she no longer uses. Her lab suggests sepsis with normal WBC with demargination, elevated bilirubin, elevated LFTs.  A CT scan is pending.    She has a superobese habitus making her examination difficult  Past Medical History  Diagnosis Date  . Asthma   . Sleep apnea   . Anemia     Past Surgical History  Procedure Laterality Date  . C section 2011  2011    Current Facility-Administered Medications  Medication Dose Route Frequency Provider Last Rate Last Dose  . 0.9 %  sodium chloride infusion   Intravenous Continuous Berle Mull, MD      . albuterol (PROVENTIL) (2.5 MG/3ML) 0.083% nebulizer solution 2.5 mg  2.5 mg Nebulization Q6H PRN Berle Mull, MD      . albuterol (PROVENTIL) (2.5 MG/3ML) 0.083% nebulizer solution 2.5 mg  2.5 mg Nebulization Q4H PRN Berle Mull, MD      . ciprofloxacin (CIPRO) IVPB 400 mg  400 mg Intravenous Q12H Dorrene German, RPH      . fluticasone (FLOVENT HFA) 44 MCG/ACT inhaler 2 puff  2 puff Inhalation BID Berle Mull, MD   2 puff at 11/16/14 2251  . heparin injection 5,000 Units  5,000 Units Subcutaneous 3 times per day Berle Mull, MD      . iohexol (OMNIPAQUE) 300 MG/ML solution 50 mL  50 mL Oral Once PRN Medication Radiologist, MD      . Derrill Memo ON 11/17/2014] metroNIDAZOLE (FLAGYL) IVPB 500 mg  500 mg Intravenous Q8H Berle Mull, MD      . ondansetron (ZOFRAN) tablet 4 mg  4 mg Oral Q6H PRN Berle Mull, MD       Or  . ondansetron (ZOFRAN) injection 4 mg  4 mg Intravenous Q6H PRN Berle Mull, MD      . oxyCODONE (Oxy IR/ROXICODONE) immediate release tablet 5 mg  5 mg Oral Q4H PRN  Berle Mull, MD       Other and Penicillins Family History  Problem Relation Age of Onset  . Hypertension Mother   . Hypertension Father   . Hypertension Sister   . Asthma Brother   . Diabetes Maternal Grandmother   . Hypertension Maternal Grandmother   . Hypertension Maternal Grandfather   . Cancer Paternal Grandmother     lung  . Emphysema Paternal Grandfather   . Allergies Mother    Social History:   reports that she has quit smoking. Her smoking use included Cigarettes. She started smoking about 3 months ago. She has a 2 pack-year smoking history. She has never used smokeless tobacco. She reports that she does not drink alcohol or use illicit drugs.   REVIEW OF SYSTEMS : Negative except for see HPI  Physical Exam:   Blood pressure 138/65, pulse 106, temperature 98.3 F (36.8 C), temperature source Oral, resp. rate 18, last menstrual period 10/20/2014, SpO2 98 %. There is no weight on file to calculate BMI.  Gen:  Morbidly obese AAF somnolent  Neurological: sleepy with nasal prongs on  Head: Normocephalic and atraumatic.  Eyes: Conjunctivae are normal. Pupils are equal, round,  and reactive to light. Sclerae muddy  Neck: Normal range of motion. Neck supple. No tracheal deviation or thyromegaly present.  Cardiovascular:  SR without murmurs or gallops.  No carotid bruits Breast:  Not examined but very large Respiratory: Effort normal.  No respiratory distress. No chest wall tenderness. Breath sounds normal.  No wheezes, rales or rhonchi.  Abdomen:  Obese and sore in upper abdomen GU:  Not examined Musculoskeletal: Normal range of motion. Extremities are nontender. No cyanosis, edema or clubbing noted Lymphadenopathy: No cervical, preauricular, postauricular or axillary adenopathy is present Skin: Skin is warm and dry. No rash noted. No diaphoresis. No erythema. No pallor. Pscyh: Normal mood and affect. Behavior is normal. Judgment and thought content normal.   LABORATORY  RESULTS: Results for orders placed or performed during the hospital encounter of 11/16/14 (from the past 48 hour(s))  Comprehensive metabolic panel     Status: Abnormal   Collection Time: 11/16/14  5:25 PM  Result Value Ref Range   Sodium 137 135 - 145 mmol/L   Potassium 4.7 3.5 - 5.1 mmol/L   Chloride 101 96 - 112 mmol/L   CO2 30 19 - 32 mmol/L   Glucose, Bld 102 (H) 70 - 99 mg/dL   BUN 33 (H) 6 - 23 mg/dL   Creatinine, Ser 0.87 0.50 - 1.10 mg/dL   Calcium 8.4 8.4 - 10.5 mg/dL   Total Protein 6.9 6.0 - 8.3 g/dL   Albumin 3.0 (L) 3.5 - 5.2 g/dL   AST 103 (H) 0 - 37 U/L   ALT 103 (H) 0 - 35 U/L   Alkaline Phosphatase 197 (H) 39 - 117 U/L   Total Bilirubin 4.1 (H) 0.3 - 1.2 mg/dL   GFR calc non Af Amer 86 (L) >90 mL/min   GFR calc Af Amer >90 >90 mL/min    Comment: (NOTE) The eGFR has been calculated using the CKD EPI equation. This calculation has not been validated in all clinical situations. eGFR's persistently <90 mL/min signify possible Chronic Kidney Disease.    Anion gap 6 5 - 15  CBC with Differential     Status: Abnormal   Collection Time: 11/16/14  5:25 PM  Result Value Ref Range   WBC 7.8 4.0 - 10.5 K/uL   RBC 5.82 (H) 3.87 - 5.11 MIL/uL   Hemoglobin 9.8 (L) 12.0 - 15.0 g/dL   HCT 37.3 36.0 - 46.0 %   MCV 64.1 (L) 78.0 - 100.0 fL   MCH 16.8 (L) 26.0 - 34.0 pg   MCHC 26.3 (L) 30.0 - 36.0 g/dL   RDW 21.9 (H) 11.5 - 15.5 %   Platelets 155 150 - 400 K/uL   Neutrophils Relative % 70 43 - 77 %   Lymphocytes Relative 16 12 - 46 %   Monocytes Relative 12 3 - 12 %   Eosinophils Relative 1 0 - 5 %   Basophils Relative 1 0 - 1 %   Neutro Abs 5.5 1.7 - 7.7 K/uL   Lymphs Abs 1.2 0.7 - 4.0 K/uL   Monocytes Absolute 0.9 0.1 - 1.0 K/uL   Eosinophils Absolute 0.1 0.0 - 0.7 K/uL   Basophils Absolute 0.1 0.0 - 0.1 K/uL   RBC Morphology POLYCHROMASIA PRESENT     Comment: TARGET CELLS RARE NRBCs    WBC Morphology INCREASED BANDS (>20% BANDS)     Comment: VACUOLATED  NEUTROPHILS  Lipase, blood     Status: Abnormal   Collection Time: 11/16/14  5:25 PM  Result Value  Ref Range   Lipase <10 (L) 11 - 59 U/L  I-Stat CG4 Lactic Acid, ED     Status: None   Collection Time: 11/16/14  8:08 PM  Result Value Ref Range   Lactic Acid, Venous 1.73 0.5 - 2.0 mmol/L  Pregnancy, urine     Status: None   Collection Time: 11/16/14  8:45 PM  Result Value Ref Range   Preg Test, Ur NEGATIVE NEGATIVE    Comment:        THE SENSITIVITY OF THIS METHODOLOGY IS >20 mIU/mL.   Urinalysis, Routine w reflex microscopic     Status: Abnormal   Collection Time: 11/16/14  8:45 PM  Result Value Ref Range   Color, Urine AMBER (A) YELLOW    Comment: BIOCHEMICALS MAY BE AFFECTED BY COLOR   APPearance CLEAR CLEAR   Specific Gravity, Urine 1.018 1.005 - 1.030   pH 5.5 5.0 - 8.0   Glucose, UA NEGATIVE NEGATIVE mg/dL   Hgb urine dipstick LARGE (A) NEGATIVE   Bilirubin Urine SMALL (A) NEGATIVE   Ketones, ur NEGATIVE NEGATIVE mg/dL   Protein, ur NEGATIVE NEGATIVE mg/dL   Urobilinogen, UA 2.0 (H) 0.0 - 1.0 mg/dL   Nitrite NEGATIVE NEGATIVE   Leukocytes, UA SMALL (A) NEGATIVE  Urine microscopic-add on     Status: None   Collection Time: 11/16/14  8:45 PM  Result Value Ref Range   Squamous Epithelial / LPF RARE RARE   WBC, UA 3-6 <3 WBC/hpf   RBC / HPF TOO NUMEROUS TO COUNT <3 RBC/hpf   Bacteria, UA RARE RARE     RADIOLOGY RESULTS: US Abdomen Complete  11/16/2014   CLINICAL DATA:  34 year old female with general abdominal pain for 1 month. History of constipation. Nausea and vomiting with pain for 5 days.  EXAM: ULTRASOUND ABDOMEN COMPLETE  COMPARISON:  Plain film 11/12/2014, ultrasound 03/12/2013  FINDINGS: Gallbladder: Anechoic fluid within the gallbladder lumen with no stones identified. Gallbladder wall thickening measuring as great as 2 mm to 3 mm. Sonographic Murphy's sign positive.  Common bile duct: Diameter: 4.6 mm  Liver: Heterogeneous echotexture of liver parenchyma.   IVC: No abnormality visualized.  Pancreas: Visualized portion unremarkable.  Spleen: Size and appearance within normal limits.  Right Kidney: Length: 13.1 cm. Echogenicity within normal limits. No mass or hydronephrosis visualized.  Left Kidney: Length: 13.3 cm. Echogenicity within normal limits. No mass or hydronephrosis visualized.  Abdominal aorta: No aneurysm visualized.  Other findings: None.  IMPRESSION: Sonographic survey of the abdomen demonstrates evidence of acalculous cholecystitis with positive sonographic Murphy sign and circumferential gallbladder wall thickening/striations. There is an absence of gallbladder stones/sludge. If there is concern for confirming ductal obstruction, correlation with HIDA study may be useful.  Evidence of hepatic steatosis.  Signed,  Dulcy Fanny. Earleen Newport, DO  [facility]  [facility]   Electronically Signed   By: Corrie Mckusick D.O.   On: 11/16/2014 20:38    Problem List: Patient Active Problem List   Diagnosis Date Noted  . Acute acalculous cholecystitis 11/16/2014  . Acalculous cholecystitis 11/16/2014  . Seasonal allergies 06/13/2014  . OSA (obstructive sleep apnea) 05/02/2014  . Thyromegaly 05/02/2014  . AMENORRHEA 02/07/2010  . Morbid obesity 03/22/2009  . Asthma with acute exacerbation 03/22/2009  . BACK PAIN 03/22/2009    Assessment & Plan: Await CT scan.  If cholecystitis, would consider continuing IV antibiotics and percutaneously draining her because of her size.  CCS MD will follow with you  Matt B. Hassell Done, MD, [facility] [phone] beeper [phone]  11/16/2014 11:27 PM

## 2014-11-16 NOTE — ED Notes (Signed)
MD at bedside.  [employer]

## 2014-11-16 NOTE — ED Notes (Signed)
Pt c/o abd pain, was seen on the 19th for constipation. States that now she has not been able to keep anything down x 5 days and stomach hurts worse.

## 2014-11-16 NOTE — Progress Notes (Signed)
ANTIBIOTIC CONSULT NOTE - INITIAL  Pharmacy Consult for Cipro Indication: Intra-abdominal infection  Allergies  Allergen Reactions  . Other Shortness Of Breath and Swelling    Tree nuts  . Penicillins     Unknown childhood allergy    Patient Measurements:   Wt= 216 kg  Vital Signs: Temp: 98.3 F (36.8 C) (02/24 2252) Temp Source: Oral (02/24 2252) BP: 138/65 mmHg (02/24 2252) Pulse Rate: 106 (02/24 2252) Intake/Output from previous day:   Intake/Output from this shift:    Labs:  Recent Labs  11/16/14 1725  WBC 7.8  HGB 9.8*  PLT 155  CREATININE 0.87   CrCl cannot be calculated (Unknown ideal weight.). No results for input(s): VANCOTROUGH, VANCOPEAK, VANCORANDOM, GENTTROUGH, GENTPEAK, GENTRANDOM, TOBRATROUGH, TOBRAPEAK, TOBRARND, AMIKACINPEAK, AMIKACINTROU, AMIKACIN in the last 72 hours.   Microbiology: No results found for this or any previous visit (from the past 720 hour(s)).  Medical History: Past Medical History  Diagnosis Date  . Asthma   . Sleep apnea   . Anemia     Medications:  Scheduled:  . ciprofloxacin  400 mg Intravenous Q12H  . fluticasone  2 puff Inhalation BID  . heparin  5,000 Units Subcutaneous 3 times per day  . [START ON 11/17/2014] metronidazole  500 mg Intravenous Q8H   Infusions:  . sodium chloride     Assessment: 33 yoF admitted with abdominal pain.  Cipro per Rx and Flagyl per MD for intra-abdominal infection.   Goal of Therapy:  Treat infection  Plan:   Cipro 400 mg IV q12h  F/u SCr/cultures as needed  Dorrene German 11/16/2014,11:47 PM

## 2014-11-16 NOTE — Telephone Encounter (Signed)
We have tried to contact pt on several occassions without a call back. Will close message per triage protocol.

## 2014-11-16 NOTE — ED Notes (Signed)
Pt's sats was low at 88% RA.  Place pt on O2 [place] 2L.  Sats 98%

## 2014-11-16 NOTE — ED Notes (Signed)
Pt reports abd pain that's been going on for "months", states this time it started about a week ago. Denies any n/v at this time.  Pt is A&Ox 4.

## 2014-11-17 ENCOUNTER — Inpatient Hospital Stay (HOSPITAL_COMMUNITY): Payer: Medicaid Other

## 2014-11-17 ENCOUNTER — Encounter (HOSPITAL_COMMUNITY): Payer: Self-pay | Admitting: Internal Medicine

## 2014-11-17 DIAGNOSIS — K760 Fatty (change of) liver, not elsewhere classified: Secondary | ICD-10-CM

## 2014-11-17 DIAGNOSIS — K5732 Diverticulitis of large intestine without perforation or abscess without bleeding: Secondary | ICD-10-CM

## 2014-11-17 DIAGNOSIS — R7989 Other specified abnormal findings of blood chemistry: Secondary | ICD-10-CM | POA: Diagnosis present

## 2014-11-17 DIAGNOSIS — R945 Abnormal results of liver function studies: Secondary | ICD-10-CM

## 2014-11-17 HISTORY — DX: Fatty (change of) liver, not elsewhere classified: K76.0

## 2014-11-17 HISTORY — DX: Diverticulitis of large intestine without perforation or abscess without bleeding: K57.32

## 2014-11-17 LAB — COMPREHENSIVE METABOLIC PANEL
ALK PHOS: 196 U/L — AB (ref 39–117)
ALT: 90 U/L — AB (ref 0–35)
AST: 75 U/L — AB (ref 0–37)
Albumin: 2.6 g/dL — ABNORMAL LOW (ref 3.5–5.2)
Anion gap: 6 (ref 5–15)
BUN: 29 mg/dL — ABNORMAL HIGH (ref 6–23)
CHLORIDE: 99 mmol/L (ref 96–112)
CO2: 31 mmol/L (ref 19–32)
Calcium: 8 mg/dL — ABNORMAL LOW (ref 8.4–10.5)
Creatinine, Ser: 0.74 mg/dL (ref 0.50–1.10)
GLUCOSE: 105 mg/dL — AB (ref 70–99)
POTASSIUM: 4 mmol/L (ref 3.5–5.1)
SODIUM: 136 mmol/L (ref 135–145)
Total Bilirubin: 4 mg/dL — ABNORMAL HIGH (ref 0.3–1.2)
Total Protein: 6.1 g/dL (ref 6.0–8.3)

## 2014-11-17 LAB — PROTIME-INR
INR: 1.46 (ref 0.00–1.49)
Prothrombin Time: 17.9 seconds — ABNORMAL HIGH (ref 11.6–15.2)

## 2014-11-17 LAB — CBC
HEMATOCRIT: 33.7 % — AB (ref 36.0–46.0)
Hemoglobin: 8.8 g/dL — ABNORMAL LOW (ref 12.0–15.0)
MCH: 17.1 pg — ABNORMAL LOW (ref 26.0–34.0)
MCHC: 26.1 g/dL — AB (ref 30.0–36.0)
MCV: 65.4 fL — AB (ref 78.0–100.0)
PLATELETS: 142 10*3/uL — AB (ref 150–400)
RBC: 5.15 MIL/uL — ABNORMAL HIGH (ref 3.87–5.11)
RDW: 22.2 % — AB (ref 11.5–15.5)
WBC: 9 10*3/uL (ref 4.0–10.5)

## 2014-11-17 MED ORDER — IOHEXOL 300 MG/ML  SOLN
100.0000 mL | Freq: Once | INTRAMUSCULAR | Status: AC | PRN
  Administered 2014-11-17: 100 mL via INTRAVENOUS

## 2014-11-17 MED ORDER — MORPHINE SULFATE 4 MG/ML IJ SOLN
INTRAMUSCULAR | Status: AC
  Filled 2014-11-17: qty 1

## 2014-11-17 MED ORDER — MORPHINE SULFATE 4 MG/ML IJ SOLN
4.0000 mg | Freq: Once | INTRAMUSCULAR | Status: AC
  Administered 2014-11-17: 4 mg via INTRAVENOUS

## 2014-11-17 MED ORDER — TECHNETIUM TC 99M MEBROFENIN IV KIT
7.5000 | PACK | Freq: Once | INTRAVENOUS | Status: AC | PRN
  Administered 2014-11-17: 7.5 via INTRAVENOUS

## 2014-11-17 MED ORDER — IOHEXOL 300 MG/ML  SOLN: 50.0000 mL | Freq: Once | INTRAMUSCULAR | Status: AC | PRN

## 2014-11-17 NOTE — Progress Notes (Signed)
Subjective: Hungry.  Wants something to eat.  Objective: Vital signs in last 24 hours: Temp:  [97.9 F (36.6 C)-98.8 F (37.1 C)] 98.8 F (37.1 C) (02/25 0554) Pulse Rate:  [102-117] 116 (02/25 0554) Resp:  [18-23] 20 (02/25 0554) BP: (101-138)/(45-81) 119/45 mmHg (02/25 0554) SpO2:  [80 %-99 %] 95 % (02/25 0554) Weight:  [465 lb (210.923 kg)] 465 lb (210.923 kg) (02/24 2252)    Intake/Output from previous day:   Intake/Output this shift:    PE: General- In NAD Abdomen-massively obese, RUQ tenderness  Lab Results:   Recent Labs  11/16/14 1725 11/17/14 0545  WBC 7.8 9.0  HGB 9.8* 8.8*  HCT 37.3 33.7*  PLT 155 142*   BMET  Recent Labs  11/16/14 1725 11/17/14 0545  NA 137 136  K 4.7 4.0  CL 101 99  CO2 30 31  GLUCOSE 102* 105*  BUN 33* 29*  CREATININE 0.87 0.74  CALCIUM 8.4 8.0*   PT/INR  Recent Labs  11/17/14 0545  LABPROT 17.9*  INR 1.46   Comprehensive Metabolic Panel:    Component Value Date/Time   NA 136 11/17/2014 0545   NA 137 11/16/2014 1725   K 4.0 11/17/2014 0545   K 4.7 11/16/2014 1725   CL 99 11/17/2014 0545   CL 101 11/16/2014 1725   CO2 31 11/17/2014 0545   CO2 30 11/16/2014 1725   BUN 29* 11/17/2014 0545   BUN 33* 11/16/2014 1725   CREATININE 0.74 11/17/2014 0545   CREATININE 0.87 11/16/2014 1725   GLUCOSE 105* 11/17/2014 0545   GLUCOSE 102* 11/16/2014 1725   CALCIUM 8.0* 11/17/2014 0545   CALCIUM 8.4 11/16/2014 1725   AST 75* 11/17/2014 0545   AST 103* 11/16/2014 1725   ALT 90* 11/17/2014 0545   ALT 103* 11/16/2014 1725   ALKPHOS 196* 11/17/2014 0545   ALKPHOS 197* 11/16/2014 1725   BILITOT 4.0* 11/17/2014 0545   BILITOT 4.1* 11/16/2014 1725   PROT 6.1 11/17/2014 0545   PROT 6.9 11/16/2014 1725   ALBUMIN 2.6* 11/17/2014 0545   ALBUMIN 3.0* 11/16/2014 1725     Studies/Results: US Abdomen Complete  11/16/2014   CLINICAL DATA:  34 year old female with general abdominal pain for 1 month. History of  constipation. Nausea and vomiting with pain for 5 days.  EXAM: ULTRASOUND ABDOMEN COMPLETE  COMPARISON:  Plain film 11/12/2014, ultrasound 03/12/2013  FINDINGS: Gallbladder: Anechoic fluid within the gallbladder lumen with no stones identified. Gallbladder wall thickening measuring as great as 2 mm to 3 mm. Sonographic Murphy's sign positive.  Common bile duct: Diameter: 4.6 mm  Liver: Heterogeneous echotexture of liver parenchyma.  IVC: No abnormality visualized.  Pancreas: Visualized portion unremarkable.  Spleen: Size and appearance within normal limits.  Right Kidney: Length: 13.1 cm. Echogenicity within normal limits. No mass or hydronephrosis visualized.  Left Kidney: Length: 13.3 cm. Echogenicity within normal limits. No mass or hydronephrosis visualized.  Abdominal aorta: No aneurysm visualized.  Other findings: None.  IMPRESSION: Sonographic survey of the abdomen demonstrates evidence of acalculous cholecystitis with positive sonographic Murphy sign and circumferential gallbladder wall thickening/striations. There is an absence of gallbladder stones/sludge. If there is concern for confirming ductal obstruction, correlation with HIDA study may be useful.  Evidence of hepatic steatosis.  Signed,  Dulcy Fanny. Earleen Newport, DO  [facility]  [facility]   Electronically Signed   By: Corrie Mckusick D.O.   On: 11/16/2014 20:38   Ct Abdomen Pelvis W Contrast  11/17/2014  CLINICAL DATA:  Abdominal pain. Elevated liver function tests. Gallbladder wall thickening on ultrasound.  EXAM: CT ABDOMEN AND PELVIS WITH CONTRAST  TECHNIQUE: Multidetector CT imaging of the abdomen and pelvis was performed using the standard protocol following bolus administration of intravenous contrast.  CONTRAST:  128m OMNIPAQUE IOHEXOL 300 MG/ML  SOLN  COMPARISON:  Ultrasound abdomen 11/16/2014.  FINDINGS: Examination is technically limited due to body habitus. Contrast bolus is limited.  The lung  bases are clear, allowing for motion artifact.  Vague low-attenuation lesion in the spleen. This probably represents a benign structure such as hemangioma or cyst. The gallbladder is contracted and of increased density consistent with sludge. No bile duct dilatation. Liver, pancreas, adrenal glands, kidneys, abdominal aorta, inferior vena cava, and retroperitoneal lymph nodes are unremarkable. Stomach and small bowel appear normal for degree of distention. Diffusely stool-filled colon. The lower descending and sigmoid colon demonstrate diffuse wall thickening with pericolonic inflammation most likely to represent diverticulitis although focal colitis could also have this appearance. There is pericolonic infiltration and edema without evidence of abscess. Followup after resolution of acute process is suggested to exclude underlying colonic neoplasm. No free air or free fluid in the abdomen.  Pelvis: Appendix is normal. The uterus and ovaries are not enlarged. No free or loculated pelvic fluid collections. No pelvic mass or lymphadenopathy. Bladder wall is not thickened. No destructive bone lesions.  IMPRESSION: Inflammatory changes in the distal descending and sigmoid colon consistent with acute diverticulitis. No abscess. Followup after resolution of acute process is recommended. Increased density of the gallbladder consistent with sludge. Nonspecific low-attenuation lesion in the spleen probably benign.   Electronically Signed   By: WLucienne CapersM.D.   On: 11/17/2014 01:03    Anti-infectives: Anti-infectives    Start     Dose/Rate Route Frequency Ordered Stop   11/17/14 0600  metroNIDAZOLE (FLAGYL) IVPB 500 mg     500 mg 100 mL/hr over 60 Minutes Intravenous Every 8 hours 11/16/14 2301     11/16/14 2359  ciprofloxacin (CIPRO) IVPB 400 mg     400 mg 200 mL/hr over 60 Minutes Intravenous Every 12 hours 11/16/14 2315     11/16/14 2000  cefTRIAXone (ROCEPHIN) 1 g in dextrose 5 % 50 mL IVPB     1 g 100  mL/hr over 30 Minutes Intravenous  Once 11/16/14 1946 11/16/14 2145   11/16/14 2000  metroNIDAZOLE (FLAGYL) IVPB 500 mg     500 mg 100 mL/hr over 60 Minutes Intravenous  Once 11/16/14 1946 11/16/14 2246      Assessment Principal Problem:   RUQ pain with elevated LFTs and mild GB wall thickening concerning for acute acalculous cholecystitis   Acute coliltis/diverticulitis-on IV abxs  Active Problems:   Morbid obesity   OSA (obstructive sleep apnea)    LOS: 1 day   Plan: HIDA scan today.   ROdis Hollingshead2/25/2016

## 2014-11-17 NOTE — Progress Notes (Signed)
Progress Note   Roxane Puerto ZDG:[id number] DOB: 1981/09/07 DOA: 11/16/2014 PCP: Annye Asa, MD   Brief Narrative:   Dominique [name] is an 34 y.o. female with a PMH of morbid obesity, asthma, probable sleep apnea, chronic anemia who was admitted on 11/16/14 with a 3-4 week history of constipation and abdominal pain. Upon initial evaluation in the ED, an abdominal ultrasound was performed which showed acalculus cholecystitis. Surgical consultation was subsequently requested.  Assessment/Plan:   Principal Problem:   Abdominal pain secondary to Acute acalculous cholecystitis / Diverticultis / Elevated LFTs - S/P ultrasound concerning for acalculous cholecystitis & CT showing diverticulitis. - Surgery following. - HIDA scan ordered. - Continue Cipro/Flagyl. - NPO for now.  Active Problems:   Asthma - Stable, continue BDs as needed.    H/O diastolic CHF - Compensated at present.    Morbid obesity / hepatic steatosis  - BMI is 85.2.  Dietician consultation for diet education. - Elevated LFTs noted, likely from acute abdominal process versus hepatic steatosis.    OSA (obstructive sleep apnea) - Oxygen ordered as needed. - Recommend outpatient sleep study.    DVT Prophylaxis - Continue heparin.  Code Status: Full. Family Communication: Multiple family updated at the bedside. Disposition Plan: Home when stable.   IV Access:    Peripheral IV   Procedures and diagnostic studies:   US Abdomen Complete 11/16/2014: Sonographic survey of the abdomen demonstrates evidence of acalculous cholecystitis with positive sonographic Murphy sign and circumferential gallbladder wall thickening/striations. There is an absence of gallbladder stones/sludge. If there is concern for confirming ductal obstruction, correlation with HIDA study may be useful.  Evidence of hepatic steatosis.    Ct Abdomen Pelvis W Contrast 11/17/2014: Inflammatory changes in the distal descending and sigmoid  colon consistent with acute diverticulitis. No abscess. Followup after resolution of acute process is recommended. Increased density of the gallbladder consistent with sludge. Nonspecific low-attenuation lesion in the spleen probably benign.      Medical Consultants:    Dr. Johnathan Hausen, Surgery.  Anti-Infectives:    Cipro 11/16/14--->  Flagyl 11/16/14--->  Subjective:   Dominique [name] is having lower abdominal pain as well as some RUQ pain.  She feels hungry and wants to eat.  No diarrhea.  Objective:    Filed Vitals:   11/16/14 1957 11/16/14 2144 11/16/14 2252 11/17/14 0554  BP:  129/58 138/65 119/45  Pulse:  111 106 116  Temp:   98.3 F (36.8 C) 98.8 F (37.1 C)  TempSrc:   Oral Oral  Resp:  18 18 20   Height:   5' 2"  (1.575 m)   Weight:   210.923 kg (465 lb)   SpO2: 98% 93% 98% 95%    Intake/Output Summary (Last 24 hours) at 11/17/14 0844 Last data filed at 11/17/14 0230  Gross per 24 hour  Intake      0 ml  Output      0 ml  Net      0 ml    Exam: Gen:  NAD Cardiovascular:  RRR, No M/R/G Respiratory:  Lungs diminished Gastrointestinal:  Abdomen with massive obesity, RUQ, lower abdominal tenderness Extremities:  + edema   Data Reviewed:    Labs: Basic Metabolic Panel:  Recent Labs Lab 11/11/14 2356 11/16/14 1725 11/17/14 0545  NA 139 137 136  K 3.8 4.7 4.0  CL 102 101 99  CO2 29 30 31   GLUCOSE 83 102* 105*  BUN 12 33* 29*  CREATININE 0.58 0.87 0.74  CALCIUM 8.7 8.4 8.0*   GFR Estimated Creatinine Clearance: 178.9 mL/min (by C-G formula based on Cr of 0.74). Liver Function Tests:  Recent Labs Lab 11/11/14 2356 11/16/14 1725 11/17/14 0545  AST 25 103* 75*  ALT 18 103* 90*  ALKPHOS 48 197* 196*  BILITOT 0.7 4.1* 4.0*  PROT 7.6 6.9 6.1  ALBUMIN 3.7 3.0* 2.6*    Recent Labs Lab 11/11/14 2356 11/16/14 1725  LIPASE 17 <10*   Coagulation profile  Recent Labs Lab 11/17/14 0545  INR 1.46    CBC:  Recent Labs Lab  11/11/14 2356 11/16/14 1725 11/17/14 0545  WBC 6.3 7.8 9.0  NEUTROABS 3.8 5.5  --   HGB 9.0* 9.8* 8.8*  HCT 35.0* 37.3 33.7*  MCV 66.0* 64.1* 65.4*  PLT PLATELET CLUMPS NOTED ON SMEAR, COUNT APPEARS ADEQUATE 155 142*   Sepsis Labs:  Recent Labs Lab 11/11/14 2356 11/16/14 1725 11/16/14 2008 11/17/14 0545  WBC 6.3 7.8  --  9.0  LATICACIDVEN  --   --  1.73  --    Microbiology No results found for this or any previous visit (from the past 240 hour(s)).   Medications:   . ciprofloxacin  400 mg Intravenous Q12H  . fluticasone  2 puff Inhalation BID  . heparin  5,000 Units Subcutaneous 3 times per day  . metronidazole  500 mg Intravenous Q8H   Continuous Infusions: . sodium chloride 50 mL (11/17/14 0016)    Time spent: 35 minutes with > 50% of time discussing current diagnostic test results, clinical impression and plan of care.    LOS: 1 day   Goldye Tourangeau  [facility] Pager [phone]. If unable to reach me by pager, please call my cell phone at [phone].  *Please refer to [email or web address], password [email or web address] to get updated schedule on who will round on this patient, as hospitalists switch teams weekly. If 7PM-7AM, please contact night-coverage at [email or web address], password [email or web address] for any overnight needs.  11/17/2014, 8:45 AM

## 2014-11-17 NOTE — Progress Notes (Signed)
Pt returned from HIDA scan. No issues noted. Fluids started back and pt is resting well in her chair. Will continue to monitor.

## 2014-11-17 NOTE — Progress Notes (Signed)
Pt down for testing, not currently available for diet ed.  Will come by again tomorrow morning.  Elmer Picker MS Dietetic Intern Pager Number [phone]

## 2014-11-18 DIAGNOSIS — R7989 Other specified abnormal findings of blood chemistry: Secondary | ICD-10-CM

## 2014-11-18 LAB — CBC
HCT: 35.1 % — ABNORMAL LOW (ref 36.0–46.0)
HEMOGLOBIN: 8.9 g/dL — AB (ref 12.0–15.0)
MCH: 16.9 pg — ABNORMAL LOW (ref 26.0–34.0)
MCHC: 25.4 g/dL — ABNORMAL LOW (ref 30.0–36.0)
MCV: 66.6 fL — AB (ref 78.0–100.0)
Platelets: 138 10*3/uL — ABNORMAL LOW (ref 150–400)
RBC: 5.27 MIL/uL — ABNORMAL HIGH (ref 3.87–5.11)
RDW: 22.5 % — AB (ref 11.5–15.5)
WBC: 12.5 10*3/uL — AB (ref 4.0–10.5)

## 2014-11-18 LAB — COMPREHENSIVE METABOLIC PANEL
ALBUMIN: 2.8 g/dL — AB (ref 3.5–5.2)
ALK PHOS: 216 U/L — AB (ref 39–117)
ALT: 91 U/L — AB (ref 0–35)
AST: 74 U/L — AB (ref 0–37)
Anion gap: 8 (ref 5–15)
BILIRUBIN TOTAL: 4.6 mg/dL — AB (ref 0.3–1.2)
BUN: 27 mg/dL — ABNORMAL HIGH (ref 6–23)
CHLORIDE: 99 mmol/L (ref 96–112)
CO2: 30 mmol/L (ref 19–32)
Calcium: 8.1 mg/dL — ABNORMAL LOW (ref 8.4–10.5)
Creatinine, Ser: 0.71 mg/dL (ref 0.50–1.10)
GFR calc Af Amer: >90 mL/min (ref >90)
GFR calc non Af Amer: >90 mL/min (ref >90)
Glucose, Bld: 124 mg/dL — ABNORMAL HIGH (ref 70–99)
POTASSIUM: 4.7 mmol/L (ref 3.5–5.1)
Sodium: 137 mmol/L (ref 135–145)
Total Protein: 6.7 g/dL (ref 6.0–8.3)

## 2014-11-18 MED ORDER — ERTAPENEM SODIUM 1 G IJ SOLR
1.0000 g | Freq: Every day | INTRAMUSCULAR | Status: DC
  Administered 2014-11-18 – 2014-11-21 (×4): 1 g via INTRAVENOUS
  Filled 2014-11-18 (×5): qty 1

## 2014-11-18 NOTE — Progress Notes (Signed)
Subjective: Falling asleep in the chair. Pain is a little better and is in her lower abdomen  Wants something to eat.  Objective: Vital signs in last 24 hours: Temp:  [97.7 F (36.5 C)-98 F (36.7 C)] 97.9 F (36.6 C) (02/26 0629) Pulse Rate:  [103-111] 111 (02/26 0629) Resp:  [20] 20 (02/26 0629) BP: (127-138)/(61-80) 138/80 mmHg (02/26 0629) SpO2:  [58 %-98 %] 96 % (02/26 0926) Last BM Date: 11/17/14  Intake/Output from previous day: 02/25 0701 - 02/26 0700 In: 1856.7 [P.O.:1320; I.V.:536.7] Out: 1550 [Urine:1550] Intake/Output this shift:    PE: General- In NAD Abdomen-massively obese, mild LLQ, RLQ, RUQ, Epigastric tenderness  Lab Results:   Recent Labs  11/17/14 0545 11/18/14 0549  WBC 9.0 12.5*  HGB 8.8* 8.9*  HCT 33.7* 35.1*  PLT 142* 138*   BMET  Recent Labs  11/17/14 0545 11/18/14 0549  NA 136 137  K 4.0 4.7  CL 99 99  CO2 31 30  GLUCOSE 105* 124*  BUN 29* 27*  CREATININE 0.74 0.71  CALCIUM 8.0* 8.1*   PT/INR  Recent Labs  11/17/14 0545  LABPROT 17.9*  INR 1.46   Comprehensive Metabolic Panel:    Component Value Date/Time   NA 137 11/18/2014 0549   NA 136 11/17/2014 0545   K 4.7 11/18/2014 0549   K 4.0 11/17/2014 0545   CL 99 11/18/2014 0549   CL 99 11/17/2014 0545   CO2 30 11/18/2014 0549   CO2 31 11/17/2014 0545   BUN 27* 11/18/2014 0549   BUN 29* 11/17/2014 0545   CREATININE 0.71 11/18/2014 0549   CREATININE 0.74 11/17/2014 0545   GLUCOSE 124* 11/18/2014 0549   GLUCOSE 105* 11/17/2014 0545   CALCIUM 8.1* 11/18/2014 0549   CALCIUM 8.0* 11/17/2014 0545   AST 74* 11/18/2014 0549   AST 75* 11/17/2014 0545   ALT 91* 11/18/2014 0549   ALT 90* 11/17/2014 0545   ALKPHOS 216* 11/18/2014 0549   ALKPHOS 196* 11/17/2014 0545   BILITOT 4.6* 11/18/2014 0549   BILITOT 4.0* 11/17/2014 0545   PROT 6.7 11/18/2014 0549   PROT 6.1 11/17/2014 0545   ALBUMIN 2.8* 11/18/2014 0549   ALBUMIN 2.6* 11/17/2014 0545      Studies/Results: Nm Hepatobiliary Including Gb  11/17/2014   CLINICAL DATA:  Elevated bili Rubin.  Cholelithiasis.  EXAM: NUCLEAR MEDICINE HEPATOBILIARY IMAGING  TECHNIQUE: Sequential images of the abdomen were obtained out to 60 minutes following intravenous administration of radiopharmaceutical.  RADIOPHARMACEUTICALS:  7.5  [employer] FI-43P Choletec  COMPARISON:  Ultrasound 11/16/2014  FINDINGS: There is delayed extraction of radiotracer from the blood pool. There is homogeneous uptake within the liver. There is delayed clearance of counts from the liver into the biliary tree. Counts are evident within the bowel by 50 minutes. At 60 minutes, the gallbladder has not filled. Morphine was administered. The gallbladder subsequent filled over the next 30 minutes.  IMPRESSION: 1. Patent cystic duct and common bile duct. No evidence of cholecystitis. 2. Poor clearance of radiotracer from the blood pool and poor excretion of radiotracer from the liver consistent hepatic dysfunction.   Electronically Signed   By: Suzy Bouchard M.D.   On: 11/17/2014 16:30   US Abdomen Complete  11/16/2014   CLINICAL DATA:  34 year old female with general abdominal pain for 1 month. History of constipation. Nausea and vomiting with pain for 5 days.  EXAM: ULTRASOUND ABDOMEN COMPLETE  COMPARISON:  Plain film 11/12/2014, ultrasound 03/12/2013  FINDINGS: Gallbladder: Anechoic fluid within the gallbladder  lumen with no stones identified. Gallbladder wall thickening measuring as great as 2 mm to 3 mm. Sonographic Murphy's sign positive.  Common bile duct: Diameter: 4.6 mm  Liver: Heterogeneous echotexture of liver parenchyma.  IVC: No abnormality visualized.  Pancreas: Visualized portion unremarkable.  Spleen: Size and appearance within normal limits.  Right Kidney: Length: 13.1 cm. Echogenicity within normal limits. No mass or hydronephrosis visualized.  Left Kidney: Length: 13.3 cm. Echogenicity within normal limits. No mass or  hydronephrosis visualized.  Abdominal aorta: No aneurysm visualized.  Other findings: None.  IMPRESSION: Sonographic survey of the abdomen demonstrates evidence of acalculous cholecystitis with positive sonographic Murphy sign and circumferential gallbladder wall thickening/striations. There is an absence of gallbladder stones/sludge. If there is concern for confirming ductal obstruction, correlation with HIDA study may be useful.  Evidence of hepatic steatosis.  Signed,  Dulcy Fanny. Earleen Newport, DO  [facility]  [facility]   Electronically Signed   By: Corrie Mckusick D.O.   On: 11/16/2014 20:38   Ct Abdomen Pelvis W Contrast  11/17/2014   CLINICAL DATA:  Abdominal pain. Elevated liver function tests. Gallbladder wall thickening on ultrasound.  EXAM: CT ABDOMEN AND PELVIS WITH CONTRAST  TECHNIQUE: Multidetector CT imaging of the abdomen and pelvis was performed using the standard protocol following bolus administration of intravenous contrast.  CONTRAST:  141m OMNIPAQUE IOHEXOL 300 MG/ML  SOLN  COMPARISON:  Ultrasound abdomen 11/16/2014.  FINDINGS: Examination is technically limited due to body habitus. Contrast bolus is limited.  The lung bases are clear, allowing for motion artifact.  Vague low-attenuation lesion in the spleen. This probably represents a benign structure such as hemangioma or cyst. The gallbladder is contracted and of increased density consistent with sludge. No bile duct dilatation. Liver, pancreas, adrenal glands, kidneys, abdominal aorta, inferior vena cava, and retroperitoneal lymph nodes are unremarkable. Stomach and small bowel appear normal for degree of distention. Diffusely stool-filled colon. The lower descending and sigmoid colon demonstrate diffuse wall thickening with pericolonic inflammation most likely to represent diverticulitis although focal colitis could also have this appearance. There is pericolonic infiltration and edema without  evidence of abscess. Followup after resolution of acute process is suggested to exclude underlying colonic neoplasm. No free air or free fluid in the abdomen.  Pelvis: Appendix is normal. The uterus and ovaries are not enlarged. No free or loculated pelvic fluid collections. No pelvic mass or lymphadenopathy. Bladder wall is not thickened. No destructive bone lesions.  IMPRESSION: Inflammatory changes in the distal descending and sigmoid colon consistent with acute diverticulitis. No abscess. Followup after resolution of acute process is recommended. Increased density of the gallbladder consistent with sludge. Nonspecific low-attenuation lesion in the spleen probably benign.   Electronically Signed   By: WLucienne CapersM.D.   On: 11/17/2014 01:03    Anti-infectives: Anti-infectives    Start     Dose/Rate Route Frequency Ordered Stop   11/17/14 0600  metroNIDAZOLE (FLAGYL) IVPB 500 mg     500 mg 100 mL/hr over 60 Minutes Intravenous Every 8 hours 11/16/14 2301     11/16/14 2359  ciprofloxacin (CIPRO) IVPB 400 mg     400 mg 200 mL/hr over 60 Minutes Intravenous Every 12 hours 11/16/14 2315     11/16/14 2000  cefTRIAXone (ROCEPHIN) 1 g in dextrose 5 % 50 mL IVPB     1 g 100 mL/hr over 30 Minutes Intravenous  Once 11/16/14 1946 11/16/14 2145   11/16/14 2000  metroNIDAZOLE (FLAGYL) IVPB 500  mg     500 mg 100 mL/hr over 60 Minutes Intravenous  Once 11/16/14 1946 11/16/14 2246      Assessment Principal Problem:   RUQ pain with elevated LFTs and mild GB wall thickening concerning for acute acalculous cholecystitis-HIDA scan shows a patent cystic duct which is not consistent with acute cholecystitis   Acute coliltis/diverticulitis-on IV abxs (Cipro, Flagyl); WBC going up Active Problems:   Morbid obesity   OSA (obstructive sleep apnea)    LOS: 2 days   Plan: Switch to Invanz.  Keep on clear liquids.   Odis Hollingshead 11/18/2014

## 2014-11-18 NOTE — Progress Notes (Addendum)
Progress Note   Dominique [name] [id number] DOB: [birth year] DOA: 11/16/2014 PCP: Annye Asa, MD   Brief Narrative:   Dominique [name] is an 34 y.o. female with a PMH of morbid obesity, asthma, probable sleep apnea, chronic anemia who was admitted on 11/16/14 with a 3-4 week history of constipation and abdominal pain. Upon initial evaluation in the ED, an abdominal ultrasound was performed which showed acalculus cholecystitis. Surgical consultation was subsequently requested.  Assessment/Plan:   Principal Problem:   Abdominal pain secondary to diverticultis / Possible acalculous cholecystitis / Elevated LFTs - S/P ultrasound concerning for acalculous cholecystitis & CT showing diverticulitis. - Surgery following. - HIDA scan showed a patent cystic duct and common bile duct with no evidence of cholecystitis. - Continue antibiotics, switched to [place] per surgery. - Diet advanced to clear liquids last night.  Continue.  Active Problems:   Asthma - Stable, continue BDs as needed.    H/O diastolic CHF - Compensated at present.    Morbid obesity / hepatic steatosis  - BMI is 85.2.  Dietician consultation for diet education. - Elevated LFTs noted, likely from acute abdominal process versus hepatic steatosis.    OSA (obstructive sleep apnea) / OHS - Oxygen ordered as needed. - Recommend outpatient sleep study.  Will make appointment.    DVT Prophylaxis - Continue heparin.  Code Status: Full. Family Communication: Multiple family updated at the bedside 11/17/14. Disposition Plan: Home when stable.   IV Access:    Peripheral IV   Procedures and diagnostic studies:   US Abdomen Complete 11/16/2014: Sonographic survey of the abdomen demonstrates evidence of acalculous cholecystitis with positive sonographic Murphy sign and circumferential gallbladder wall thickening/striations. There is an absence of gallbladder stones/sludge. If there is concern for confirming ductal  obstruction, correlation with HIDA study may be useful.  Evidence of hepatic steatosis.    Ct Abdomen Pelvis W Contrast 11/17/2014: Inflammatory changes in the distal descending and sigmoid colon consistent with acute diverticulitis. No abscess. Followup after resolution of acute process is recommended. Increased density of the gallbladder consistent with sludge. Nonspecific low-attenuation lesion in the spleen probably benign.    Nm Hepatobiliary Including Gb 11/17/2014: 1. Patent cystic duct and common bile duct. No evidence of cholecystitis. 2. Poor clearance of radiotracer from the blood pool and poor excretion of radiotracer from the liver consistent hepatic dysfunction.     Medical Consultants:    Dr. Johnathan Hausen, Surgery.  Anti-Infectives:    Cipro 11/16/14--->11/18/14  Flagyl 11/16/14--->11/18/14  Invanz 11/18/14--->  Subjective:   Dominique [name] is very lethargic today.  She has not received any sedating medications per RN.  She drops her oxygen saturations when sleeping.  Bowels have moved.  Pain slightly improved.  Objective:    Filed Vitals:   11/17/14 1856 11/17/14 1935 11/17/14 2129 11/18/14 0629  BP: 127/61  135/79 138/80  Pulse: 104  103 111  Temp: 98 F (36.7 C)  97.7 F (36.5 C) 97.9 F (36.6 C)  TempSrc: Oral  Oral Oral  Resp: 20  20 20   Height:      Weight:      SpO2: 98% 97% 96% 96%    Intake/Output Summary (Last 24 hours) at 11/18/14 0820 Last data filed at 11/18/14 0600  Gross per 24 hour  Intake 1856.67 ml  Output   1550 ml  Net 306.67 ml    Exam: Gen:  NAD Cardiovascular:  RRR, No M/R/G Respiratory:  Lungs diminished Gastrointestinal:  Abdomen with massive obesity,  less tender Extremities:  + edema   Data Reviewed:    Labs: Basic Metabolic Panel:  Recent Labs Lab 11/11/14 2356 11/16/14 1725 11/17/14 0545 11/18/14 0549  NA 139 137 136 137  K 3.8 4.7 4.0 4.7  CL 102 101 99 99  CO2 29 30 31 30   GLUCOSE 83 102* 105* 124*  BUN  12 33* 29* 27*  CREATININE 0.58 0.87 0.74 0.71  CALCIUM 8.7 8.4 8.0* 8.1*   GFR Estimated Creatinine Clearance: 178.9 mL/min (by C-G formula based on Cr of 0.71). Liver Function Tests:  Recent Labs Lab 11/11/14 2356 11/16/14 1725 11/17/14 0545 11/18/14 0549  AST 25 103* 75* 74*  ALT 18 103* 90* 91*  ALKPHOS 48 197* 196* 216*  BILITOT 0.7 4.1* 4.0* 4.6*  PROT 7.6 6.9 6.1 6.7  ALBUMIN 3.7 3.0* 2.6* 2.8*    Recent Labs Lab 11/11/14 2356 11/16/14 1725  LIPASE 17 <10*   Coagulation profile  Recent Labs Lab 11/17/14 0545  INR 1.46    CBC:  Recent Labs Lab 11/11/14 2356 11/16/14 1725 11/17/14 0545 11/18/14 0549  WBC 6.3 7.8 9.0 PENDING  NEUTROABS 3.8 5.5  --   --   HGB 9.0* 9.8* 8.8* 8.9*  HCT 35.0* 37.3 33.7* 35.1*  MCV 66.0* 64.1* 65.4* 66.6*  PLT PLATELET CLUMPS NOTED ON SMEAR, COUNT APPEARS ADEQUATE 155 142* 138*   Sepsis Labs:  Recent Labs Lab 11/11/14 2356 11/16/14 1725 11/16/14 2008 11/17/14 0545 11/18/14 0549  WBC 6.3 7.8  --  9.0 PENDING  LATICACIDVEN  --   --  1.73  --   --    Microbiology No results found for this or any previous visit (from the past 240 hour(s)).   Medications:   . ciprofloxacin  400 mg Intravenous Q12H  . fluticasone  2 puff Inhalation BID  . heparin  5,000 Units Subcutaneous 3 times per day  . metronidazole  500 mg Intravenous Q8H   Continuous Infusions: . sodium chloride 50 mL (11/18/14 0509)    Time spent: 25 minutes.    LOS: 2 days   RAMA,CHRISTINA  [facility] Pager [phone]. If unable to reach me by pager, please call my cell phone at [phone].  *Please refer to [email or web address], password [email or web address] to get updated schedule on who will round on this patient, as hospitalists switch teams weekly. If 7PM-7AM, please contact night-coverage at [email or web address], password [email or web address] for any overnight needs.  11/18/2014, 8:20 AM

## 2014-11-18 NOTE — Progress Notes (Signed)
Pt states that she has a sleep study scheduled in April. New appointment not made. Pt also stated that she had a "little bit" of blood on her toilet paper when she wiped today. Writer asked if she was on her period and pt stated "I think it came from my butt". Writer told pt to call for nurse the next time she has an episode like this again. Pt has yet to have another episode. Will pass along to oncoming shift.

## 2014-11-18 NOTE — Progress Notes (Signed)
Pt refuses to walk the halls. States "I can't walk until I eat". Writer continues to encourage pt to ambulate with continuous refusal. Will continue to monitor.

## 2014-11-18 NOTE — Plan of Care (Signed)
Problem: Food- and Nutrition-Related Knowledge Deficit (NB-1.1) Goal: Nutrition education Formal process to instruct or train a patient/client in a skill or to impart knowledge to help patients/clients voluntarily manage or modify food choices and eating behavior to maintain or improve health. Outcome: Completed/Met Date Met:  11/18/14 RD consulted for diet education    Wt Readings from Last 3 Encounters:  11/16/14 465 lb (210.923 kg)  10/27/14 478 lb (216.819 kg)  08/26/14 470 lb 8 oz (213.417 kg)   Pt reports no recent weight loss and no changes in appetite.  Pt shows no signs of fat or muscle wasting.   Pt provided with "General Healthy Eating Nutrition Therapy" handout from the [employer].  Discussed healthy options for each food group.  Emphasized lean meat and healthy fats.    Provided examples of ways to cut down fat in her diet and explained the effects of fat on the gallbladder. Pt says she will bake her chicken more and can try eating brown rice instead of white rice. Sister also in the room and participated some in education. Teach back method used.  Pt was very sleepy during education.  Expect fair compliance.   Body mass index is 85.03 kg/(m^2). Pt meets criteria for Obesity Class III based on body mass index.   Pt is currently on a Clear Liquid Diet and eating 0-20%.  RD to monitor PO intake.  May be due to drowsiness.  Labs and charts reviewed.  Re-consult RD if further nutrition issues arise.

## 2014-11-19 DIAGNOSIS — K625 Hemorrhage of anus and rectum: Secondary | ICD-10-CM | POA: Diagnosis present

## 2014-11-19 LAB — CBC
HEMATOCRIT: 36 % (ref 36.0–46.0)
Hemoglobin: 8.9 g/dL — ABNORMAL LOW (ref 12.0–15.0)
MCH: 16.7 pg — ABNORMAL LOW (ref 26.0–34.0)
MCHC: 24.7 g/dL — AB (ref 30.0–36.0)
MCV: 67.4 fL — AB (ref 78.0–100.0)
PLATELETS: 139 10*3/uL — AB (ref 150–400)
RBC: 5.34 MIL/uL — ABNORMAL HIGH (ref 3.87–5.11)
RDW: 22.9 % — ABNORMAL HIGH (ref 11.5–15.5)
WBC: 8.9 10*3/uL (ref 4.0–10.5)

## 2014-11-19 NOTE — Progress Notes (Signed)
Progress Note   Dominique [name] JQZ:[id number] DOB: 1981-01-29 DOA: 11/16/2014 PCP: Annye Asa, MD   Brief Narrative:   Dominique [name] is an 34 y.o. female with a PMH of morbid obesity, asthma, probable sleep apnea, chronic anemia who was admitted on 11/16/14 with a 3-4 week history of constipation and abdominal pain. Upon initial evaluation in the ED, an abdominal ultrasound was performed which showed acalculus cholecystitis. Surgical consultation was subsequently requested.  Assessment/Plan:   Principal Problem:   Abdominal pain secondary to diverticultis / Possible acalculous cholecystitis / Elevated LFTs / Rectal bleeding - S/P ultrasound concerning for acalculous cholecystitis & CT showing diverticulitis. - Surgery following. - HIDA scan showed a patent cystic duct and common bile duct with no evidence of cholecystitis. - Continue antibiotics, switched to [place] per surgery. - Diet advanced to clear liquids last night.  Continue. - Reports some rectal bleeding, but hemoglobin stable.  Monitor H&H.  Active Problems:   Asthma - Stable, continue BDs as needed.    H/O diastolic CHF - Compensated at present.    Morbid obesity / hepatic steatosis  - BMI is 85.2.  Dietician provided diet education 11/18/14. - Elevated LFTs noted, likely from acute abdominal process versus hepatic steatosis.    OSA (obstructive sleep apnea) / OHS - Oxygen ordered as needed. - Has already had an outpatient sleep study.  Has appointment 01/08/15 for C Pap titration.    DVT Prophylaxis - Continue heparin.  Code Status: Full. Family Communication: Multiple family updated at the bedside 11/17/14. Disposition Plan: Home when stable.   IV Access:    Peripheral IV   Procedures and diagnostic studies:   US Abdomen Complete 11/16/2014: Sonographic survey of the abdomen demonstrates evidence of acalculous cholecystitis with positive sonographic Murphy sign and circumferential gallbladder wall  thickening/striations. There is an absence of gallbladder stones/sludge. If there is concern for confirming ductal obstruction, correlation with HIDA study may be useful.  Evidence of hepatic steatosis.    Ct Abdomen Pelvis W Contrast 11/17/2014: Inflammatory changes in the distal descending and sigmoid colon consistent with acute diverticulitis. No abscess. Followup after resolution of acute process is recommended. Increased density of the gallbladder consistent with sludge. Nonspecific low-attenuation lesion in the spleen probably benign.    Nm Hepatobiliary Including Gb 11/17/2014: 1. Patent cystic duct and common bile duct. No evidence of cholecystitis. 2. Poor clearance of radiotracer from the blood pool and poor excretion of radiotracer from the liver consistent hepatic dysfunction.     Medical Consultants:    Dr. Johnathan Hausen, Surgery.  Anti-Infectives:    Cipro 11/16/14--->11/18/14  Flagyl 11/16/14--->11/18/14  Invanz 11/18/14--->  Subjective:   Dominique [name] is more awake today.  Reports some blood in the stools and on toilet tissue when she wipes.  Still having some moderate abdominal pain.  No nausea/vomiting.  Objective:    Filed Vitals:   11/18/14 1410 11/18/14 2200 11/18/14 2306 11/19/14 0630  BP: 121/50 113/62  101/53  Pulse: 95 77  88  Temp: 98.4 F (36.9 C) 98.2 F (36.8 C)  98.3 F (36.8 C)  TempSrc: Oral Oral  Oral  Resp: 18 20  20   Height:      Weight:      SpO2: 98% 99% 95% 100%    Intake/Output Summary (Last 24 hours) at 11/19/14 0827 Last data filed at 11/19/14 0600  Gross per 24 hour  Intake   1350 ml  Output      0 ml  Net  1350 ml    Exam: Gen:  NAD Cardiovascular:  RRR, No M/R/G Respiratory:  Lungs diminished Gastrointestinal:  Abdomen with massive obesity, less tender Extremities:  + edema   Data Reviewed:    Labs: Basic Metabolic Panel:  Recent Labs Lab 11/16/14 1725 11/17/14 0545 11/18/14 0549  NA 137 136 137  K 4.7 4.0  4.7  CL 101 99 99  CO2 30 31 30   GLUCOSE 102* 105* 124*  BUN 33* 29* 27*  CREATININE 0.87 0.74 0.71  CALCIUM 8.4 8.0* 8.1*   GFR Estimated Creatinine Clearance: 178.9 mL/min (by C-G formula based on Cr of 0.71). Liver Function Tests:  Recent Labs Lab 11/16/14 1725 11/17/14 0545 11/18/14 0549  AST 103* 75* 74*  ALT 103* 90* 91*  ALKPHOS 197* 196* 216*  BILITOT 4.1* 4.0* 4.6*  PROT 6.9 6.1 6.7  ALBUMIN 3.0* 2.6* 2.8*    Recent Labs Lab 11/16/14 1725  LIPASE <10*   Coagulation profile  Recent Labs Lab 11/17/14 0545  INR 1.46    CBC:  Recent Labs Lab 11/16/14 1725 11/17/14 0545 11/18/14 0549 11/19/14 0536  WBC 7.8 9.0 12.5* 8.9  NEUTROABS 5.5  --   --   --   HGB 9.8* 8.8* 8.9* 8.9*  HCT 37.3 33.7* 35.1* 36.0  MCV 64.1* 65.4* 66.6* 67.4*  PLT 155 142* 138* 139*   Sepsis Labs:  Recent Labs Lab 11/16/14 1725 11/16/14 2008 11/17/14 0545 11/18/14 0549 11/19/14 0536  WBC 7.8  --  9.0 12.5* 8.9  LATICACIDVEN  --  1.73  --   --   --    Microbiology No results found for this or any previous visit (from the past 240 hour(s)).   Medications:   . ertapenem  1 g Intravenous Daily  . fluticasone  2 puff Inhalation BID  . heparin  5,000 Units Subcutaneous 3 times per day   Continuous Infusions: . sodium chloride 50 mL/hr at 11/19/14 0103    Time spent: 25 minutes.    LOS: 3 days   [facility] Pager [phone]. If unable to reach me by pager, please call my cell phone at [phone].  *Please refer to [email or web address], password [email or web address] to get updated schedule on who will round on this patient, as hospitalists switch teams weekly. If 7PM-7AM, please contact night-coverage at [email or web address], password [email or web address] for any overnight needs.  11/19/2014, 8:27 AM

## 2014-11-19 NOTE — Progress Notes (Signed)
Patient ID: Dominique [name], female   DOB: 03-28-1981, 34 y.o.   MRN: [id number]    Subjective: Feels somewhat better. Abdominal pain is improved. She describes rectal and left lower quadrant abdominal pain when she has bowel movements. No nausea or vomiting. He would like something to eat.  Objective: Vital signs in last 24 hours: Temp:  [98.2 F (36.8 C)-98.4 F (36.9 C)] 98.3 F (36.8 C) (02/27 0630) Pulse Rate:  [77-95] 88 (02/27 0630) Resp:  [18-20] 20 (02/27 0630) BP: (101-121)/(50-62) 101/53 mmHg (02/27 0630) SpO2:  [95 %-100 %] 100 % (02/27 0906) Last BM Date: 11/18/14  Intake/Output from previous day: 02/26 0701 - 02/27 0700 In: 1350 [P.O.:120; I.V.:1200; IV Piggyback:30] Out: -  Intake/Output this shift:    General appearance: alert, cooperative, no distress and morbidly obese GI: moderate tenderness in the left lower quadrant and very mild tenderness in the remainder of the abdomen without guarding or evidence of peritoneal signs.  Lab Results:   Recent Labs  11/18/14 0549 11/19/14 0536  WBC 12.5* 8.9  HGB 8.9* 8.9*  HCT 35.1* 36.0  PLT 138* 139*   BMET  Recent Labs  11/17/14 0545 11/18/14 0549  NA 136 137  K 4.0 4.7  CL 99 99  CO2 31 30  GLUCOSE 105* 124*  BUN 29* 27*  CREATININE 0.74 0.71  CALCIUM 8.0* 8.1*     Studies/Results: Nm Hepatobiliary Including Gb  11/17/2014   CLINICAL DATA:  Elevated bili Rubin.  Cholelithiasis.  EXAM: NUCLEAR MEDICINE HEPATOBILIARY IMAGING  TECHNIQUE: Sequential images of the abdomen were obtained out to 60 minutes following intravenous administration of radiopharmaceutical.  RADIOPHARMACEUTICALS:  7.5  [place] IO-03T Choletec  COMPARISON:  Ultrasound 11/16/2014  FINDINGS: There is delayed extraction of radiotracer from the blood pool. There is homogeneous uptake within the liver. There is delayed clearance of counts from the liver into the biliary tree. Counts are evident within the bowel by 50 minutes. At 60 minutes,  the gallbladder has not filled. Morphine was administered. The gallbladder subsequent filled over the next 30 minutes.  IMPRESSION: 1. Patent cystic duct and common bile duct. No evidence of cholecystitis. 2. Poor clearance of radiotracer from the blood pool and poor excretion of radiotracer from the liver consistent hepatic dysfunction.   Electronically Signed   By: Suzy Bouchard M.D.   On: 11/17/2014 16:30    Anti-infectives: Anti-infectives    Start     Dose/Rate Route Frequency Ordered Stop   11/18/14 1200  ertapenem (INVANZ) 1 g in sodium chloride 0.9 % 50 mL IVPB     1 g 100 mL/hr over 30 Minutes Intravenous Daily 11/18/14 1029     11/17/14 0600  metroNIDAZOLE (FLAGYL) IVPB 500 mg  Status:  Discontinued     500 mg 100 mL/hr over 60 Minutes Intravenous Every 8 hours 11/16/14 2301 11/18/14 1029   11/16/14 2359  ciprofloxacin (CIPRO) IVPB 400 mg  Status:  Discontinued     400 mg 200 mL/hr over 60 Minutes Intravenous Every 12 hours 11/16/14 2315 11/18/14 1029   11/16/14 2000  cefTRIAXone (ROCEPHIN) 1 g in dextrose 5 % 50 mL IVPB     1 g 100 mL/hr over 30 Minutes Intravenous  Once 11/16/14 1946 11/16/14 2145   11/16/14 2000  metroNIDAZOLE (FLAGYL) IVPB 500 mg     500 mg 100 mL/hr over 60 Minutes Intravenous  Once 11/16/14 1946 11/16/14 2246      Assessment/Plan: Apparent diverticulitis or colitis involving sigmoid colon. HIDA was  negative And cholecystitis seems very unlikely. Overall improving with less pain and white count normalized. Would continue IV antibiotics and clear liquids today.    LOS: 3 days    Tavone Caesar T 11/19/2014

## 2014-11-19 NOTE — Progress Notes (Signed)
Pt has had bloody diarrhea stools and reports that she also had there bloody stools yesterday. Has not been able to separate urine and stool so that nursing could actually see the amount. MD notified, will continue to monitor for now.

## 2014-11-20 ENCOUNTER — Inpatient Hospital Stay (HOSPITAL_COMMUNITY): Payer: Medicaid Other

## 2014-11-20 DIAGNOSIS — I951 Orthostatic hypotension: Secondary | ICD-10-CM | POA: Diagnosis not present

## 2014-11-20 DIAGNOSIS — W19XXXD Unspecified fall, subsequent encounter: Secondary | ICD-10-CM

## 2014-11-20 DIAGNOSIS — I959 Hypotension, unspecified: Secondary | ICD-10-CM

## 2014-11-20 DIAGNOSIS — W19XXXA Unspecified fall, initial encounter: Secondary | ICD-10-CM | POA: Diagnosis present

## 2014-11-20 LAB — CBC
HCT: 31 % — ABNORMAL LOW (ref 36.0–46.0)
Hemoglobin: 8 g/dL — ABNORMAL LOW (ref 12.0–15.0)
MCH: 17 pg — ABNORMAL LOW (ref 26.0–34.0)
MCHC: 25.8 g/dL — AB (ref 30.0–36.0)
MCV: 65.8 fL — AB (ref 78.0–100.0)
Platelets: 135 10*3/uL — ABNORMAL LOW (ref 150–400)
RBC: 4.71 MIL/uL (ref 3.87–5.11)
RDW: 22.9 % — ABNORMAL HIGH (ref 11.5–15.5)
WBC: 9.4 10*3/uL (ref 4.0–10.5)

## 2014-11-20 LAB — COMPREHENSIVE METABOLIC PANEL
ALK PHOS: 264 U/L — AB (ref 39–117)
ALT: 78 U/L — AB (ref 0–35)
AST: 81 U/L — ABNORMAL HIGH (ref 0–37)
Albumin: 2.4 g/dL — ABNORMAL LOW (ref 3.5–5.2)
Anion gap: 3 — ABNORMAL LOW (ref 5–15)
BILIRUBIN TOTAL: 3.9 mg/dL — AB (ref 0.3–1.2)
BUN: 21 mg/dL (ref 6–23)
CO2: 31 mmol/L (ref 19–32)
Calcium: 7.4 mg/dL — ABNORMAL LOW (ref 8.4–10.5)
Chloride: 96 mmol/L (ref 96–112)
Creatinine, Ser: 0.48 mg/dL — ABNORMAL LOW (ref 0.50–1.10)
GFR calc Af Amer: >90 mL/min (ref >90)
GFR calc non Af Amer: >90 mL/min (ref >90)
Glucose, Bld: 96 mg/dL (ref 70–99)
Potassium: 4.4 mmol/L (ref 3.5–5.1)
SODIUM: 130 mmol/L — AB (ref 135–145)
Total Protein: 6 g/dL (ref 6.0–8.3)

## 2014-11-20 MED ORDER — BISMUTH SUBSALICYLATE 262 MG/15ML PO SUSP
30.0000 mL | Freq: Three times a day (TID) | ORAL | Status: DC | PRN
  Administered 2014-11-20: 30 mL via ORAL
  Filled 2014-11-20: qty 236

## 2014-11-20 MED ORDER — PROMETHAZINE HCL 25 MG/ML IJ SOLN: 6.2500 mg | INTRAMUSCULAR | Status: DC | PRN

## 2014-11-20 MED ORDER — LIP MEDEX EX OINT
TOPICAL_OINTMENT | CUTANEOUS | Status: AC
  Filled 2014-11-20: qty 7

## 2014-11-20 MED ORDER — LACTATED RINGERS IV BOLUS (SEPSIS): 1000.0000 mL | Freq: Three times a day (TID) | INTRAVENOUS | Status: DC | PRN

## 2014-11-20 MED ORDER — ACETAMINOPHEN 325 MG PO TABS: 325.0000 mg | ORAL_TABLET | Freq: Four times a day (QID) | ORAL | Status: DC | PRN

## 2014-11-20 MED ORDER — MENTHOL 3 MG MT LOZG
1.0000 | LOZENGE | OROMUCOSAL | Status: DC | PRN
  Filled 2014-11-20: qty 9

## 2014-11-20 MED ORDER — ALUM & MAG HYDROXIDE-SIMETH 200-200-20 MG/5ML PO SUSP: 30.0000 mL | Freq: Four times a day (QID) | ORAL | Status: DC | PRN

## 2014-11-20 MED ORDER — MAGIC MOUTHWASH
15.0000 mL | Freq: Four times a day (QID) | ORAL | Status: DC | PRN
  Filled 2014-11-20: qty 15

## 2014-11-20 MED ORDER — ACETAMINOPHEN 650 MG RE SUPP: 650.0000 mg | Freq: Four times a day (QID) | RECTAL | Status: DC | PRN

## 2014-11-20 MED ORDER — SACCHAROMYCES BOULARDII 250 MG PO CAPS
250.0000 mg | ORAL_CAPSULE | Freq: Two times a day (BID) | ORAL | Status: DC
  Administered 2014-11-20 – 2014-11-23 (×7): 250 mg via ORAL
  Filled 2014-11-20 (×8): qty 1

## 2014-11-20 MED ORDER — DIPHENHYDRAMINE HCL 50 MG/ML IJ SOLN: 12.5000 mg | Freq: Four times a day (QID) | INTRAMUSCULAR | Status: DC | PRN

## 2014-11-20 MED ORDER — PHENOL 1.4 % MT LIQD
2.0000 | OROMUCOSAL | Status: DC | PRN
  Filled 2014-11-20: qty 177

## 2014-11-20 NOTE — Progress Notes (Signed)
Progress Note   Dominique [name] [id number]:[id number] DOB: 06/13/1981 DOA: 11/16/2014 PCP: Annye Asa, MD   Brief Narrative:   Dominique [name] is an 34 y.o. female with a PMH of morbid obesity, asthma, probable sleep apnea, chronic anemia who was admitted on 11/16/14 with a 3-4 week history of constipation and abdominal pain. Upon initial evaluation in the ED, an abdominal ultrasound was performed which showed acalculus cholecystitis. Surgical consultation was subsequently requested.  Assessment/Plan:   Principal Problem:   Abdominal pain secondary to diverticultis / Possible acalculous cholecystitis / Elevated LFTs / Rectal bleeding / diarrhea - S/P ultrasound concerning for acalculous cholecystitis & CT showing diverticulitis. - HIDA scan showed a patent cystic duct and common bile duct with no evidence of cholecystitis. - Continue antibiotics, switched to [place] per surgery.  No current surgical indication. - Diet advanced to [facility] today. - Reports some rectal bleeding, hemoglobin 8.9--->8.0 overnight.  Monitor. - C diff PCR ordered to rule out C. Diff.  Active Problems:   Hypotension / Fall - BP transiently low last night.  Fall likely related to orthostatic changes. - BP stable at present. - Left hip/pelvic films pending.    Asthma - Stable, continue BDs as needed.    H/O diastolic CHF - Compensated at present.    Morbid obesity / hepatic steatosis  - BMI is 85.2.  Dietician provided diet education 11/18/14. - Elevated LFTs noted, likely from acute abdominal process versus hepatic steatosis.    OSA (obstructive sleep apnea) / OHS - Oxygen ordered as needed. - Has already had an outpatient sleep study.  Has appointment 01/08/15 for C Pap titration.    DVT Prophylaxis - Continue heparin.  Code Status: Full. Family Communication: Multiple family updated at the bedside 11/17/14. Disposition Plan: Home when stable.   IV Access:    Peripheral IV   Procedures and diagnostic  studies:   US Abdomen Complete 11/16/2014: Sonographic survey of the abdomen demonstrates evidence of acalculous cholecystitis with positive sonographic Murphy sign and circumferential gallbladder wall thickening/striations. There is an absence of gallbladder stones/sludge. If there is concern for confirming ductal obstruction, correlation with HIDA study may be useful.  Evidence of hepatic steatosis.    Ct Abdomen Pelvis W Contrast 11/17/2014: Inflammatory changes in the distal descending and sigmoid colon consistent with acute diverticulitis. No abscess. Followup after resolution of acute process is recommended. Increased density of the gallbladder consistent with sludge. Nonspecific low-attenuation lesion in the spleen probably benign.    Nm Hepatobiliary Including Gb 11/17/2014: 1. Patent cystic duct and common bile duct. No evidence of cholecystitis. 2. Poor clearance of radiotracer from the blood pool and poor excretion of radiotracer from the liver consistent hepatic dysfunction.     Medical Consultants:    Dr. Johnathan Hausen, Surgery.  Anti-Infectives:    Cipro 11/16/14--->11/18/14  Flagyl 11/16/14--->11/18/14  Invanz 11/18/14--->  Subjective:   Dominique [name] fell yesterday.  No complaints of hip pain.  Still reports diarrhea with blood in the stools, but also actively menstruating. Still having abdominal pain.  Objective:    Filed Vitals:   11/19/14 2333 11/19/14 2350 11/20/14 0313 11/20/14 0440  BP: 63/49 117/45 104/62 138/61  Pulse: 102 101 99 94  Temp: 98.5 F (36.9 C)  98.5 F (36.9 C) 98 F (36.7 C)  TempSrc: Oral  Oral Oral  Resp: 18  20 18   Height:      Weight:      SpO2: 100%  100% 99%    Intake/Output  Summary (Last 24 hours) at 11/20/14 0830 Last data filed at 11/20/14 0600  Gross per 24 hour  Intake   1200 ml  Output      1 ml  Net   1199 ml    Exam: Gen:  NAD Cardiovascular:  RRR, No M/R/G Respiratory:  Lungs diminished Gastrointestinal:  Abdomen  with massive obesity, less tender Extremities:  + edema   Data Reviewed:    Labs: Basic Metabolic Panel:  Recent Labs Lab 11/16/14 1725 11/17/14 0545 11/18/14 0549 11/20/14 0607  NA 137 136 137 130*  K 4.7 4.0 4.7 4.4  CL 101 99 99 96  CO2 30 31 30 31   GLUCOSE 102* 105* 124* 96  BUN 33* 29* 27* 21  CREATININE 0.87 0.74 0.71 0.48*  CALCIUM 8.4 8.0* 8.1* 7.4*   GFR Estimated Creatinine Clearance: 178.9 mL/min (by C-G formula based on Cr of 0.48). Liver Function Tests:  Recent Labs Lab 11/16/14 1725 11/17/14 0545 11/18/14 0549 11/20/14 0607  AST 103* 75* 74* 81*  ALT 103* 90* 91* 78*  ALKPHOS 197* 196* 216* 264*  BILITOT 4.1* 4.0* 4.6* 3.9*  PROT 6.9 6.1 6.7 6.0  ALBUMIN 3.0* 2.6* 2.8* 2.4*    Recent Labs Lab 11/16/14 1725  LIPASE <10*   Coagulation profile  Recent Labs Lab 11/17/14 0545  INR 1.46    CBC:  Recent Labs Lab 11/16/14 1725 11/17/14 0545 11/18/14 0549 11/19/14 0536 11/20/14 0607  WBC 7.8 9.0 12.5* 8.9 9.4  NEUTROABS 5.5  --   --   --   --   HGB 9.8* 8.8* 8.9* 8.9* 8.0*  HCT 37.3 33.7* 35.1* 36.0 31.0*  MCV 64.1* 65.4* 66.6* 67.4* 65.8*  PLT 155 142* 138* 139* 135*   Sepsis Labs:  Recent Labs Lab 11/16/14 2008 11/17/14 0545 11/18/14 0549 11/19/14 0536 11/20/14 0607  WBC  --  9.0 12.5* 8.9 9.4  LATICACIDVEN 1.73  --   --   --   --    Microbiology No results found for this or any previous visit (from the past 240 hour(s)).   Medications:   . ertapenem  1 g Intravenous Daily  . fluticasone  2 puff Inhalation BID  . heparin  5,000 Units Subcutaneous 3 times per day  . lip balm       Continuous Infusions: . sodium chloride 50 mL/hr at 11/19/14 2153    Time spent: 25 minutes.    LOS: 4 days   [facility] Pager [phone]. If unable to reach me by pager, please call my cell phone at [phone].  *Please refer to [email or web address], password [email or web address] to get updated schedule on who will round on this  patient, as hospitalists switch teams weekly. If 7PM-7AM, please contact night-coverage at [email or web address], password [email or web address] for any overnight needs.  11/20/2014, 8:30 AM

## 2014-11-20 NOTE — Progress Notes (Signed)
On return from xray, pt very lethargic on RA.  Pt rebounded to 100% on 2L [place], remains lethargic, but responsive. MD notified.

## 2014-11-20 NOTE — Progress Notes (Signed)
[address] [place], [place] [address] Phone: [phone] FAX: [phone]    Dominique [name] [id number] [birth year]  CARE TEAM:  PCP: Annye Asa, MD  Outpatient Care Team: Patient Care Team: Midge Minium, MD as PCP - General (Family Medicine)  Inpatient Treatment Team: Treatment Team: Attending Provider: Venetia Maxon Rama, MD; Rounding Team: Md Edison Pace, MD; Rounding Team: Fatima Blank, MD; Technician: Rudi Heap, NT; Registered Nurse: Mortimer Fries, RN; Registered Nurse: Kristopher Oppenheim, RN  Problem List:   Principal Problem:   Diverticulitis of colon Active Problems:   Morbid obesity   OSA (obstructive sleep apnea)   Hepatic steatosis   Elevated LFTs   Rectal bleeding        Assessment  Diarrhea - most likely recovering from diverticulitis  Plan:  -r/o Cdiff -probiotic -prob OK to try Pepto but avoid imodium for now -cont IV ABx for diverticulitis -Adv to fulls PO -no evidence GB an issue -VTE prophylaxis- SCDs, etc -mobilize as tolerated to help recovery  The patient is stable.  There is no evidence of peritonitis, acute abdomen, nor shock.  There is no strong evidence of failure of improvement nor decline with current non-operative management.  There is no need for surgery at the present moment.   We will continue to follow.   Adin Hector, M.D., F.A.C.S. Gastrointestinal and Minimally Invasive Surgery [facility] [address] [place], [place] [address] [phone] Main / Paging   11/20/2014  Subjective:  Lots of diarrhea Some blood with BMs - menstrating as well Fell to floor - denies hip pain Hard to get up with obesity & diarrhea   Objective:  Vital signs:  Filed Vitals:   11/19/14 2333 11/19/14 2350 11/20/14 0313 11/20/14 0440  BP: 63/49 117/45 104/62 138/61  Pulse: 102 101 99 94  Temp: 98.5 F (36.9 C)  98.5 F  (36.9 C) 98 F (36.7 C)  TempSrc: Oral  Oral Oral  Resp: _0 Height:      Weight:      SpO2: 100%  100% 99%    Last BM Date: 11/20/14  Intake/Output   Yesterday:  02/27 0701 - 02/28 0700 In: 1200 [I.V.:1200] Out: 1 [Stool:1] This shift:     Bowel function:  Flatus: y  BM: many loose ?blood as well (menstrating though)  Drain: n/a  Physical Exam:  General: Pt awake/alert/oriented x4 in mild acute distress Eyes: PERRL, normal EOM.  Sclera clear.  No icterus Neuro: CN II-XII intact w/o focal sensory/motor deficits. Lymph: No head/neck/groin lymphadenopathy Psych:  No delerium/psychosis/paranoia.  Frustrated/tearful but consolable HENT: Normocephalic, Mucus membranes moist.  No thrush Neck: Supple, No tracheal deviation Chest: No chest wall pain w good excursion CV:  Pulses intact.  Regular rhythm MS: Normal AROM mjr joints.  No obvious deformity Abdomen: Soft.  Superobese.  Nondistended.  Mildly tender at lower abdomen.  No evidence of peritonitis.  No incarcerated hernias. Ext:  SCDs BLE.  No mjr edema.  No cyanosis Skin: No petechiae / purpura  Results:   Labs: Results for orders placed or performed during the hospital encounter of 11/16/14 (from the past 48 hour(s))  CBC     Status: Abnormal   Collection Time: 11/19/14  5:36 AM  Result Value Ref Range   WBC 8.9 4.0 - 10.5 K/uL   RBC 5.34 (H) 3.87 - 5.11 MIL/uL   Hemoglobin 8.9 (L) 12.0 -  15.0 g/dL   HCT 36.0 36.0 - 46.0 %   MCV 67.4 (L) 78.0 - 100.0 fL   MCH 16.7 (L) 26.0 - 34.0 pg   MCHC 24.7 (L) 30.0 - 36.0 g/dL   RDW 22.9 (H) 11.5 - 15.5 %   Platelets 139 (L) 150 - 400 K/uL  CBC     Status: Abnormal   Collection Time: 11/20/14  6:07 AM  Result Value Ref Range   WBC 9.4 4.0 - 10.5 K/uL   RBC 4.71 3.87 - 5.11 MIL/uL   Hemoglobin 8.0 (L) 12.0 - 15.0 g/dL   HCT 31.0 (L) 36.0 - 46.0 %   MCV 65.8 (L) 78.0 - 100.0 fL   MCH 17.0 (L) 26.0 - 34.0 pg   MCHC 25.8 (L) 30.0 - 36.0 g/dL   RDW 22.9 (H)  11.5 - 15.5 %   Platelets 135 (L) 150 - 400 K/uL  Comprehensive metabolic panel     Status: Abnormal   Collection Time: 11/20/14  6:07 AM  Result Value Ref Range   Sodium 130 (L) 135 - 145 mmol/L    Comment: RESULT REPEATED AND VERIFIED   Potassium 4.4 3.5 - 5.1 mmol/L    Comment: RESULT REPEATED AND VERIFIED   Chloride 96 96 - 112 mmol/L    Comment: RESULT REPEATED AND VERIFIED   CO2 31 19 - 32 mmol/L    Comment: RESULT REPEATED AND VERIFIED   Glucose, Bld 96 70 - 99 mg/dL   BUN 21 6 - 23 mg/dL   Creatinine, Ser 0.48 (L) 0.50 - 1.10 mg/dL   Calcium 7.4 (L) 8.4 - 10.5 mg/dL    Comment: RESULT REPEATED AND VERIFIED   Total Protein 6.0 6.0 - 8.3 g/dL   Albumin 2.4 (L) 3.5 - 5.2 g/dL   AST 81 (H) 0 - 37 U/L   ALT 78 (H) 0 - 35 U/L   Alkaline Phosphatase 264 (H) 39 - 117 U/L   Total Bilirubin 3.9 (H) 0.3 - 1.2 mg/dL   GFR calc non Af Amer >90 >90 mL/min   GFR calc Af Amer >90 >90 mL/min    Comment: (NOTE) The eGFR has been calculated using the CKD EPI equation. This calculation has not been validated in all clinical situations. eGFR's persistently <90 mL/min signify possible Chronic Kidney Disease.    Anion gap 3 (L) 5 - 15    Imaging / Studies: No results found.  Medications / Allergies: per chart  Antibiotics: Anti-infectives    Start     Dose/Rate Route Frequency Ordered Stop   11/18/14 1200  ertapenem (INVANZ) 1 g in sodium chloride 0.9 % 50 mL IVPB     1 g 100 mL/hr over 30 Minutes Intravenous Daily 11/18/14 1029     11/17/14 0600  metroNIDAZOLE (FLAGYL) IVPB 500 mg  Status:  Discontinued     500 mg 100 mL/hr over 60 Minutes Intravenous Every 8 hours 11/16/14 2301 11/18/14 1029   11/16/14 2359  ciprofloxacin (CIPRO) IVPB 400 mg  Status:  Discontinued     400 mg 200 mL/hr over 60 Minutes Intravenous Every 12 hours 11/16/14 2315 11/18/14 1029   11/16/14 2000  cefTRIAXone (ROCEPHIN) 1 g in dextrose 5 % 50 mL IVPB     1 g 100 mL/hr over 30 Minutes Intravenous  Once  11/16/14 1946 11/16/14 2145   11/16/14 2000  metroNIDAZOLE (FLAGYL) IVPB 500 mg     500 mg 100 mL/hr over 60 Minutes Intravenous  Once 11/16/14 1946 11/16/14  2246       Note: Portions of this report may have been transcribed using voice recognition software. Every effort was made to ensure accuracy; however, inadvertent computerized transcription errors may be present.   Any transcriptional errors that result from this process are unintentional.     Adin Hector, M.D., F.A.C.S. Gastrointestinal and Minimally Invasive Surgery [facility] [address], [place], [place] [address] [phone] Main / Paging   11/20/2014

## 2014-11-20 NOTE — Progress Notes (Signed)
Pt was stable before fall, and afterwards she c/o tenderness in her right hip region. Notified provider on call. Order for xray obtained. Pt was embarassed and cried after the fall because she could not get off the floor even with staff assistance. Had to use lift to put pt back in bed because she stated that she could not pull herself up because of her having bad knees. VS were taken, post fall huddle done, completed SZP.

## 2014-11-21 LAB — CBC
HCT: 28 % — ABNORMAL LOW (ref 36.0–46.0)
Hemoglobin: 7.4 g/dL — ABNORMAL LOW (ref 12.0–15.0)
MCH: 17.2 pg — ABNORMAL LOW (ref 26.0–34.0)
MCHC: 26.4 g/dL — ABNORMAL LOW (ref 30.0–36.0)
MCV: 65 fL — AB (ref 78.0–100.0)
Platelets: 156 10*3/uL (ref 150–400)
RBC: 4.31 MIL/uL (ref 3.87–5.11)
RDW: 23.3 % — ABNORMAL HIGH (ref 11.5–15.5)
WBC: 8.9 10*3/uL (ref 4.0–10.5)

## 2014-11-21 LAB — COMPREHENSIVE METABOLIC PANEL
ALK PHOS: 287 U/L — AB (ref 39–117)
ALT: 71 U/L — ABNORMAL HIGH (ref 0–35)
AST: 100 U/L — ABNORMAL HIGH (ref 0–37)
Albumin: 2.2 g/dL — ABNORMAL LOW (ref 3.5–5.2)
Anion gap: 3 — ABNORMAL LOW (ref 5–15)
BUN: 14 mg/dL (ref 6–23)
CO2: 32 mmol/L (ref 19–32)
Calcium: 7.7 mg/dL — ABNORMAL LOW (ref 8.4–10.5)
Chloride: 102 mmol/L (ref 96–112)
Creatinine, Ser: 0.44 mg/dL — ABNORMAL LOW (ref 0.50–1.10)
GLUCOSE: 80 mg/dL (ref 70–99)
POTASSIUM: 5 mmol/L (ref 3.5–5.1)
Sodium: 137 mmol/L (ref 135–145)
Total Bilirubin: 3.1 mg/dL — ABNORMAL HIGH (ref 0.3–1.2)
Total Protein: 5.7 g/dL — ABNORMAL LOW (ref 6.0–8.3)

## 2014-11-21 MED ORDER — POLYSACCHARIDE IRON COMPLEX 150 MG PO CAPS
150.0000 mg | ORAL_CAPSULE | Freq: Every day | ORAL | Status: DC
  Administered 2014-11-21 – 2014-11-23 (×3): 150 mg via ORAL
  Filled 2014-11-21 (×4): qty 1

## 2014-11-21 MED ORDER — PROMETHAZINE HCL 25 MG/ML IJ SOLN: 6.2500 mg | INTRAMUSCULAR | Status: DC | PRN

## 2014-11-21 NOTE — Progress Notes (Signed)
Pt continues to have menses and rectal bleeding. Hgb 7.4. MD notified. New order to d/c heparin. Will continue to monitor.

## 2014-11-21 NOTE — Progress Notes (Signed)
[facility] Progress Note     Subjective: Pt sleepy this am.  No N/V, abdominal pain improved.  Tolerating fulls.    Objective: Vital signs in last 24 hours: Temp:  [97.8 F (36.6 C)-99 F (37.2 C)] 99 F (37.2 C) (02/29 0618) Pulse Rate:  [97-106] 106 (02/29 0618) Resp:  [17-19] 17 (02/29 0618) BP: (101-130)/(48-71) 126/48 mmHg (02/29 0618) SpO2:  [95 %-100 %] 98 % (02/29 0618) Last BM Date: 11/20/14  Intake/Output from previous day: 02/28 0701 - 02/29 0700 In: 1195.8 [I.V.:1095.8; IV Piggyback:100] Out: 1500 [Urine:1500] Intake/Output this shift:    PE: Gen:  Alert, NAD, pleasant Abd: Soft, minimal tenderness, ND, +BS, no HSM   Lab Results:   Recent Labs  11/19/14 0536 11/20/14 0607  WBC 8.9 9.4  HGB 8.9* 8.0*  HCT 36.0 31.0*  PLT 139* 135*   BMET  Recent Labs  11/20/14 0607  NA 130*  K 4.4  CL 96  CO2 31  GLUCOSE 96  BUN 21  CREATININE 0.48*  CALCIUM 7.4*   PT/INR No results for input(s): LABPROT, INR in the last 72 hours. CMP     Component Value Date/Time   NA 130* 11/20/2014 0607   K 4.4 11/20/2014 0607   CL 96 11/20/2014 0607   CO2 31 11/20/2014 0607   GLUCOSE 96 11/20/2014 0607   BUN 21 11/20/2014 0607   CREATININE 0.48* 11/20/2014 0607   CALCIUM 7.4* 11/20/2014 0607   PROT 6.0 11/20/2014 0607   ALBUMIN 2.4* 11/20/2014 0607   AST 81* 11/20/2014 0607   ALT 78* 11/20/2014 0607   ALKPHOS 264* 11/20/2014 0607   BILITOT 3.9* 11/20/2014 0607   GFRNONAA >90 11/20/2014 0607   GFRAA >90 11/20/2014 0607   Lipase     Component Value Date/Time   LIPASE <10* 11/16/2014 1725       Studies/Results: Dg Hip Unilat With Pelvis 2-3 Views Right  11/20/2014   CLINICAL DATA:  Fall and hospital room last night. Right hip pain. The patient was admitted for rectal bleeding.  EXAM: RIGHT HIP (WITH PELVIS) 2-3 VIEWS  COMPARISON:  CT of the abdomen and pelvis without contrast 11/17/2014.  FINDINGS: The pelvis is intact. The right hip is  located. No acute bone or soft tissue abnormalities are present.  IMPRESSION: Negative right hip radiographs.   Electronically Signed   By: San Morelle M.D.   On: 11/20/2014 15:25    Anti-infectives: Anti-infectives    Start     Dose/Rate Route Frequency Ordered Stop   11/18/14 1200  ertapenem (INVANZ) 1 g in sodium chloride 0.9 % 50 mL IVPB     1 g 100 mL/hr over 30 Minutes Intravenous Daily 11/18/14 1029     11/17/14 0600  metroNIDAZOLE (FLAGYL) IVPB 500 mg  Status:  Discontinued     500 mg 100 mL/hr over 60 Minutes Intravenous Every 8 hours 11/16/14 2301 11/18/14 1029   11/16/14 2359  ciprofloxacin (CIPRO) IVPB 400 mg  Status:  Discontinued     400 mg 200 mL/hr over 60 Minutes Intravenous Every 12 hours 11/16/14 2315 11/18/14 1029   11/16/14 2000  cefTRIAXone (ROCEPHIN) 1 g in dextrose 5 % 50 mL IVPB     1 g 100 mL/hr over 30 Minutes Intravenous  Once 11/16/14 1946 11/16/14 2145   11/16/14 2000  metroNIDAZOLE (FLAGYL) IVPB 500 mg     500 mg 100 mL/hr over 60 Minutes Intravenous  Once 11/16/14 1946 11/16/14 2246  Assessment/Plan Diverticulitis -IV abx Invanz Day #4 -Advance to carb mod diet -Ambulate and IS Diarrhea -Likely due to diverticulitis -r/o c.diff -probiotic -do not give imodium VTE proph -SCD's and heparin Morbid obesity with BMI 85.2    LOS: 5 days    DORT, MEGAN 11/21/2014, 7:52 AM Pager: [phone]  Agree with above. Lack of motivation to get out of bed. Feels better. Needs to ambulate.  She has PT/OT ordered.  Alphonsa Overall, MD, [facility] Pager: [phone] Office phone:  [phone]

## 2014-11-21 NOTE — Evaluation (Signed)
Physical Therapy Evaluation Patient Details Name: Dominique [name] MRN: [id number] DOB: Mar 05, 1981 Today's Date: 11/21/2014   History of Present Illness  Pt admitted with abdominal pain with diverticulitis and possible acalculus cholecystitis. Hospital course complicated by hypotension, rectal bleeding and diarrhea.  PMH: asthma, anemia and morbid obesity.  Clinical Impression  On eval, pt required Min assist +2 for mobility-able to perform stand pivot x2, bed <>bsc. Increased time and effort for all mobility. Pt fatigues easily and required rest breaks frequently. Will follow during hospital stay.     Follow Up Recommendations Home health PT;Supervision/Assistance - 24 hour    Equipment Recommendations   (to be determined)    Recommendations for Other Services       Precautions / Restrictions Precautions Precautions: Fall Restrictions Weight Bearing Restrictions: No      Mobility  Bed Mobility Overal bed mobility: Needs Assistance Bed Mobility: Supine to Sit;Sit to Supine     Supine to sit: HOB elevated;Supervision     General bed mobility comments: heavy reliance on rail, required extended time due to need for rest breaks  Transfers Overall transfer level: Needs assistance   Transfers: Sit to/from Stand Sit to Stand: Min assist;+2 physical assistance;+2 safety/equipment Stand pivot transfers: Min assist;+2 physical assistance;+2 safety/equipment       General transfer comment: Assist to rise. stabilize, control descent. Stand pivot x 2, bed<>bsc.   Ambulation/Gait             General Gait Details: NT-pt weak.   Stairs            Wheelchair Mobility    Modified Rankin (Stroke Patients Only)       Balance Overall balance assessment: Needs assistance Sitting-balance support: Feet supported Sitting balance-Leahy Scale: Fair     Standing balance support: No upper extremity supported Standing balance-Leahy Scale: Poor                                Pertinent Vitals/Pain Pain Assessment: No/denies pain    Home Living Family/patient expects to be discharged to:: Private residence Living Arrangements: Children;Parent (8 year old daughter) Available Help at Discharge: Family Type of Home: Apartment Home Access: Stairs to enter Entrance Stairs-Rails: Right;Left;Can reach both Technical brewer of Steps: 8 Home Layout: One level Home Equipment: None      Prior Function Level of Independence: Independent               Hand Dominance   Dominant Hand: Right    Extremity/Trunk Assessment   Upper Extremity Assessment: Generalized weakness           Lower Extremity Assessment: Generalized weakness      Cervical / Trunk Assessment: Normal  Communication   Communication: No difficulties  Cognition Arousal/Alertness: Awake/alert Behavior During Therapy: WFL for tasks assessed/performed Overall Cognitive Status: Within Functional Limits for tasks assessed                      General Comments      Exercises        Assessment/Plan    PT Assessment Patient needs continued PT services  PT Diagnosis Difficulty walking;Generalized weakness;Acute pain   PT Problem List Decreased strength;Decreased activity tolerance;Decreased balance;Decreased mobility;Obesity;Decreased knowledge of use of DME;Pain  PT Treatment Interventions DME instruction;Gait training;Functional mobility training;Therapeutic activities;Therapeutic exercise;Patient/family education   PT Goals (Current goals can be found in the Care Plan section) Acute Rehab PT Goals Patient  Stated Goal: return home to take care of her daughter PT Goal Formulation: With patient Time For Goal Achievement: 12/05/14 Potential to Achieve Goals: Good    Frequency Min 3X/week   Barriers to discharge        Co-evaluation   Reason for Co-Treatment: For patient/therapist safety;Complexity of the patient's impairments (multi-system  involvement)   OT goals addressed during session: ADL's and self-care       End of Session   Activity Tolerance: Patient limited by fatigue Patient left: in bed;with call bell/phone within reach           Time: 9622-2979 PT Time Calculation (min) (ACUTE ONLY): 29 min   Charges:   PT Evaluation $Initial PT Evaluation Tier I: 1 Procedure     PT G Codes:        Dominique [name], MPT Pager: [phone]

## 2014-11-21 NOTE — Evaluation (Signed)
Occupational Therapy Evaluation Patient Details Name: Dominique [name] MRN: [id number] DOB: 05-17-1981 Today's Date: 11/21/2014    History of Present Illness Pt admitted with abdominal pain with diverticulitis and possible acalculus cholecystitis. Hospital course complicated by hypotension, rectal bleeding and diarrhea.  PMH: asthma, anemia and morbid obesity.   Clinical Impression   Pt was independent prior to admission and cared for her 35 year old daughter.  Pt presents with poor activity tolerance, generalized weakness, and decreased balance interfering with ability to perform self care and ADL transfers. Pt's current anemia and body habitus further impeding independence and endurance.  Will follow acutely. With resolving of medical issues, expect pt will be able to return home.   Follow Up Recommendations  Home health OT (depending on progress)   Equipment Recommendations  3 in 1 bedside comode (bariatric)    Recommendations for Other Services       Precautions / Restrictions Precautions Precautions: Fall Restrictions Weight Bearing Restrictions: No      Mobility Bed Mobility Overal bed mobility: Needs Assistance Bed Mobility: Supine to Sit     Supine to sit: Supervision;HOB elevated     General bed mobility comments: heavy reliance on rail, required extended time due to need for rest breaks  Transfers Overall transfer level: Needs assistance   Transfers: Sit to/from Stand;Stand Pivot Transfers Sit to Stand: +2 physical assistance;Min assist Stand pivot transfers: +2 physical assistance;Min assist       General transfer comment: uses momentum to stand    Balance Overall balance assessment: Needs assistance Sitting-balance support: Feet supported Sitting balance-Leahy Scale: Fair     Standing balance support: No upper extremity supported Standing balance-Leahy Scale: Poor                              ADL Overall ADL's : Needs  assistance/impaired Eating/Feeding: Set up;Bed level   Grooming: Wash/dry hands;Set up;Sitting   Upper Body Bathing: Minimal assitance;Sitting   Lower Body Bathing: Total assistance;Sit to/from stand   Upper Body Dressing : Set up;Sitting   Lower Body Dressing: Maximal assistance;Sit to/from stand   Toilet Transfer: +2 for physical assistance;Minimal assistance;Stand-pivot;BSC;Requires wide/bariatric   Toileting- Clothing Manipulation and Hygiene: Sit to/from stand;Maximal assistance         General ADL Comments: Pt limited by poor activity tolerance, impaired balance, generalized weakness and body habitus.     Vision     Perception     Praxis      Pertinent Vitals/Pain Pain Assessment: No/denies pain     Hand Dominance Right   Extremity/Trunk Assessment Upper Extremity Assessment Upper Extremity Assessment: Generalized weakness           Communication Communication Communication: No difficulties   Cognition Arousal/Alertness: Awake/alert Behavior During Therapy: WFL for tasks assessed/performed Overall Cognitive Status: Within Functional Limits for tasks assessed                     General Comments       Exercises       Shoulder Instructions      Home Living Family/patient expects to be discharged to:: Private residence Living Arrangements: Children;Parent (57 year old daughter) Available Help at Discharge: Family Type of Home: Apartment Home Access: Stairs to enter Technical brewer of Steps: 8 Entrance Stairs-Rails: Right;Left;Can reach both Home Layout: One level     Bathroom Shower/Tub: Teacher, early years/pre: Standard     Home Equipment: None  Prior Functioning/Environment Level of Independence: Independent             OT Diagnosis: Generalized weakness   OT Problem List: Decreased strength;Decreased activity tolerance;Impaired balance (sitting and/or standing);Decreased knowledge of use  of DME or AE;Obesity;Cardiopulmonary status limiting activity   OT Treatment/Interventions: Self-care/ADL training;Energy conservation;DME and/or AE instruction;Therapeutic activities;Patient/family education;Balance training    OT Goals(Current goals can be found in the care plan section) Acute Rehab OT Goals Patient Stated Goal: return home to take care of her daughter OT Goal Formulation: With patient Time For Goal Achievement: 12/05/14 Potential to Achieve Goals: Good ADL Goals Pt Will Perform Grooming: with supervision;standing (1 activity) Pt Will Perform Upper Body Bathing: with set-up;with adaptive equipment;sitting Pt Will Perform Lower Body Bathing: with supervision;with adaptive equipment;sit to/from stand Pt Will Perform Upper Body Dressing: with supervision;sitting Pt Will Perform Lower Body Dressing: with supervision;with adaptive equipment;sit to/from stand Pt Will Transfer to Toilet: with supervision;ambulating;bedside commode (over toilet) Pt Will Perform Toileting - Clothing Manipulation and hygiene: with supervision;sit to/from stand Pt Will Perform Tub/Shower Transfer: Tub transfer;ambulating;with min assist Additional ADL Goal #1: Pt will generalize energy conservation strategies in ADL and mobility independently.  OT Frequency: Min 2X/week   Barriers to D/C:            Co-evaluation PT/OT/SLP Co-Evaluation/Treatment: Yes Reason for Co-Treatment: For patient/therapist safety;Complexity of the patient's impairments (multi-system involvement)   OT goals addressed during session: ADL's and self-care      End of Session Equipment Utilized During Treatment: Oxygen Nurse Communication: Mobility status  Activity Tolerance: Patient limited by fatigue Patient left: in bed;with call bell/phone within reach   Time: 1411-1440 OT Time Calculation (min): 29 min Charges:  OT General Charges $OT Visit: 1 Procedure OT Evaluation $Initial OT Evaluation Tier I: 1  Procedure G-Codes:    Dominique [name] 11/21/2014, 2:01 PM [phone]

## 2014-11-21 NOTE — Progress Notes (Signed)
Progress Note   Dominique [name] EXN:[id number] DOB: 1981-01-30 DOA: 11/16/2014 PCP: Annye Asa, MD   Brief Narrative:   Dominique [name] is an 34 y.o. female with a PMH of morbid obesity, asthma, probable sleep apnea, chronic anemia who was admitted on 11/16/14 with a 3-4 week history of constipation and abdominal pain. Upon initial evaluation in the ED, an abdominal ultrasound was performed which showed acalculus cholecystitis. Surgical consultation was subsequently requested.  Assessment/Plan:   Principal Problem:   Abdominal pain secondary to diverticultis / Possible acalculous cholecystitis / Elevated LFTs / Rectal bleeding / diarrhea - S/P ultrasound concerning for acalculous cholecystitis & CT showing diverticulitis. - HIDA scan showed a patent cystic duct and common bile duct with no evidence of cholecystitis. - Continue antibiotics, switched to [place] per surgery.  No current surgical indication. - Diet advanced to [facility] 11/20/14. - Reports some rectal bleeding, hemoglobin 8.9--->8.0 overnight.  Monitor. - C diff PCR ordered to rule out C. Diff.  Active Problems:   Hypotension / Fall - BP transiently low 11/19/14.  Had a fall on that date, likely related to orthostatic changes. - BP stable at present. - Left hip/pelvic films negative.    Asthma - Stable, continue BDs as needed.    H/O diastolic CHF - Compensated at present.    Morbid obesity / hepatic steatosis  - BMI is 85.2.  Dietician provided diet education 11/18/14. - Elevated LFTs noted, likely from acute abdominal process versus hepatic steatosis.    OSA (obstructive sleep apnea) / OHS - Oxygen ordered as needed. - Has already had an outpatient sleep study.  Has appointment 01/08/15 for C Pap titration.    DVT Prophylaxis - Continue heparin.  Code Status: Full. Family Communication: Multiple family updated at the bedside 11/17/14. Disposition Plan: Home when stable.   IV Access:    Peripheral  IV   Procedures and diagnostic studies:   US Abdomen Complete 11/16/2014: Sonographic survey of the abdomen demonstrates evidence of acalculous cholecystitis with positive sonographic Murphy sign and circumferential gallbladder wall thickening/striations. There is an absence of gallbladder stones/sludge. If there is concern for confirming ductal obstruction, correlation with HIDA study may be useful.  Evidence of hepatic steatosis.    Ct Abdomen Pelvis W Contrast 11/17/2014: Inflammatory changes in the distal descending and sigmoid colon consistent with acute diverticulitis. No abscess. Followup after resolution of acute process is recommended. Increased density of the gallbladder consistent with sludge. Nonspecific low-attenuation lesion in the spleen probably benign.    Nm Hepatobiliary Including Gb 11/17/2014: 1. Patent cystic duct and common bile duct. No evidence of cholecystitis. 2. Poor clearance of radiotracer from the blood pool and poor excretion of radiotracer from the liver consistent hepatic dysfunction.    Dg Hip Unilat With Pelvis 2-3 Views Right 11/20/2014: Negative right hip radiographs.    Medical Consultants:    Dr. Johnathan Hausen, Surgery.  Anti-Infectives:    Cipro 11/16/14--->11/18/14  Flagyl 11/16/14--->11/18/14  Invanz 11/18/14--->  Subjective:   Dominique [name] is still having abdominal pain and some blood in the stools.  Hungry.    Objective:    Filed Vitals:   11/20/14 1900 11/20/14 2047 11/20/14 2102 11/21/14 0618  BP: 128/70  101/71 126/48  Pulse: 100  97 106  Temp: 97.8 F (36.6 C)  98.4 F (36.9 C) 99 F (37.2 C)  TempSrc: Oral   Oral  Resp: 18  19 17   Height:      Weight:  SpO2: 100% 95% 98% 98%    Intake/Output Summary (Last 24 hours) at 11/21/14 0848 Last data filed at 11/21/14 0800  Gross per 24 hour  Intake 1195.83 ml  Output   1650 ml  Net -454.17 ml    Exam: Gen:  NAD Cardiovascular:  RRR, No M/R/G Respiratory:  Lungs  diminished Gastrointestinal:  Abdomen with massive obesity, less tender Extremities:  + edema   Data Reviewed:    Labs: Basic Metabolic Panel:  Recent Labs Lab 11/16/14 1725 11/17/14 0545 11/18/14 0549 11/20/14 0607  NA 137 136 137 130*  K 4.7 4.0 4.7 4.4  CL 101 99 99 96  CO2 30 31 30 31   GLUCOSE 102* 105* 124* 96  BUN 33* 29* 27* 21  CREATININE 0.87 0.74 0.71 0.48*  CALCIUM 8.4 8.0* 8.1* 7.4*   GFR Estimated Creatinine Clearance: 178.9 mL/min (by C-G formula based on Cr of 0.48). Liver Function Tests:  Recent Labs Lab 11/16/14 1725 11/17/14 0545 11/18/14 0549 11/20/14 0607  AST 103* 75* 74* 81*  ALT 103* 90* 91* 78*  ALKPHOS 197* 196* 216* 264*  BILITOT 4.1* 4.0* 4.6* 3.9*  PROT 6.9 6.1 6.7 6.0  ALBUMIN 3.0* 2.6* 2.8* 2.4*    Recent Labs Lab 11/16/14 1725  LIPASE <10*   Coagulation profile  Recent Labs Lab 11/17/14 0545  INR 1.46    CBC:  Recent Labs Lab 11/16/14 1725 11/17/14 0545 11/18/14 0549 11/19/14 0536 11/20/14 0607  WBC 7.8 9.0 12.5* 8.9 9.4  NEUTROABS 5.5  --   --   --   --   HGB 9.8* 8.8* 8.9* 8.9* 8.0*  HCT 37.3 33.7* 35.1* 36.0 31.0*  MCV 64.1* 65.4* 66.6* 67.4* 65.8*  PLT 155 142* 138* 139* 135*   Sepsis Labs:  Recent Labs Lab 11/16/14 2008 11/17/14 0545 11/18/14 0549 11/19/14 0536 11/20/14 0607  WBC  --  9.0 12.5* 8.9 9.4  LATICACIDVEN 1.73  --   --   --   --    Microbiology No results found for this or any previous visit (from the past 240 hour(s)).   Medications:   . ertapenem  1 g Intravenous Daily  . fluticasone  2 puff Inhalation BID  . heparin  5,000 Units Subcutaneous 3 times per day  . saccharomyces boulardii  250 mg Oral BID   Continuous Infusions: . sodium chloride 50 mL/hr at 11/19/14 2153    Time spent: 25 minutes.    LOS: 5 days   [facility] Pager [phone]. If unable to reach me by pager, please call my cell phone at [phone].  *Please refer to [email or web address],  password [email or web address] to get updated schedule on who will round on this patient, as hospitalists switch teams weekly. If 7PM-7AM, please contact night-coverage at [email or web address], password [email or web address] for any overnight needs.  11/21/2014, 8:48 AM

## 2014-11-21 NOTE — Progress Notes (Signed)
Pt had 2 episodes of blood from rectum last night. At least one of them appeared to have small clots. Will continue to monitor.

## 2014-11-22 LAB — CBC
HCT: 27.8 % — ABNORMAL LOW (ref 36.0–46.0)
Hemoglobin: 7.5 g/dL — ABNORMAL LOW (ref 12.0–15.0)
MCH: 17.5 pg — ABNORMAL LOW (ref 26.0–34.0)
MCHC: 27 g/dL — ABNORMAL LOW (ref 30.0–36.0)
MCV: 64.8 fL — ABNORMAL LOW (ref 78.0–100.0)
PLATELETS: 196 10*3/uL (ref 150–400)
RBC: 4.29 MIL/uL (ref 3.87–5.11)
RDW: 23.9 % — AB (ref 11.5–15.5)
WBC: 10.6 10*3/uL — AB (ref 4.0–10.5)

## 2014-11-22 MED ORDER — POTASSIUM CHLORIDE CRYS ER 20 MEQ PO TBCR
40.0000 meq | EXTENDED_RELEASE_TABLET | Freq: Once | ORAL | Status: AC
  Administered 2014-11-22: 40 meq via ORAL
  Filled 2014-11-22: qty 2

## 2014-11-22 MED ORDER — METRONIDAZOLE 500 MG PO TABS
500.0000 mg | ORAL_TABLET | Freq: Three times a day (TID) | ORAL | Status: DC
  Administered 2014-11-22 – 2014-11-23 (×4): 500 mg via ORAL
  Filled 2014-11-22 (×6): qty 1

## 2014-11-22 MED ORDER — FUROSEMIDE 40 MG PO TABS
40.0000 mg | ORAL_TABLET | Freq: Once | ORAL | Status: AC
  Administered 2014-11-22: 40 mg via ORAL
  Filled 2014-11-22: qty 1

## 2014-11-22 MED ORDER — CIPROFLOXACIN HCL 500 MG PO TABS
500.0000 mg | ORAL_TABLET | Freq: Two times a day (BID) | ORAL | Status: DC
  Administered 2014-11-22 – 2014-11-23 (×3): 500 mg via ORAL
  Filled 2014-11-22 (×5): qty 1

## 2014-11-22 NOTE — Progress Notes (Addendum)
Progress Note   Dominique [name] YQM:[id number] DOB: 1980/10/06 DOA: 11/16/2014 PCP: Annye Asa, MD   Brief Narrative:   Dominique [name] is an 34 y.o. female with a PMH of morbid obesity, asthma, probable sleep apnea, chronic anemia who was admitted on 11/16/14 with a 3-4 week history of constipation and abdominal pain. Upon initial evaluation in the ED, an abdominal ultrasound was performed which showed acalculus cholecystitis. CT of abdomen showed diverticulitis, and HIDA scan was negative for cholecystitis.  She has been treated with IV antibiotics but hospital course complicated by her morbid obesity and a fall.  Surgery has followed her clinical course, signed off 11/22/14 with recommendations to treat diverticulitis with a 10 day course of antibiotics.  Assessment/Plan:   Principal Problem:   Abdominal pain secondary to diverticultis / Possible acalculous cholecystitis / Elevated LFTs / Rectal bleeding / diarrhea - S/P ultrasound concerning for acalculous cholecystitis & CT showing diverticulitis. - HIDA scan showed a patent cystic duct and common bile duct with no evidence of cholecystitis. - Continue antibiotics for a total of 10 days, currently on Invanz per surgery. Switch to oral Cipro/Flagyl.   - No current surgical indication. - Diet advanced to CM 11/21/14.  Tolerating well. - Reports some rectal bleeding, hemoglobin stable over the past 24 hours. Iron started 11/21/14. - Given ongoing diarrhea, C diff PCR ordered to rule out C. Diff.  Sample has not been sent off yet.  Active Problems:   Hypotension / Fall - BP transiently low 11/19/14.  Had a fall on that date, likely related to orthostatic changes. - BP stable at present. - Left hip/pelvic films negative.    Asthma - Stable, continue BDs as needed.    H/O diastolic CHF - Compensated at present, but reports LE swelling making it hard for her to ambulate. - Lasix 40 mg PO today.    Morbid obesity / hepatic steatosis  -  BMI is 85.2.  Dietician provided diet education 11/18/14. - Elevated LFTs noted, likely from hepatic steatosis.    OSA (obstructive sleep apnea) / OHS - Oxygen ordered as needed. - Has already had an outpatient sleep study.  Has appointment 01/08/15 for C Pap titration.    DVT Prophylaxis - Continue heparin.  Code Status: Full. Family Communication: Multiple family updated at the bedside 11/17/14. Disposition Plan: Home 11/23/14 if she can ambulate safely and stools negative for C. Diff.   IV Access:    Peripheral IV   Procedures and diagnostic studies:   US Abdomen Complete 11/16/2014: Sonographic survey of the abdomen demonstrates evidence of acalculous cholecystitis with positive sonographic Murphy sign and circumferential gallbladder wall thickening/striations. There is an absence of gallbladder stones/sludge. If there is concern for confirming ductal obstruction, correlation with HIDA study may be useful.  Evidence of hepatic steatosis.    Ct Abdomen Pelvis W Contrast 11/17/2014: Inflammatory changes in the distal descending and sigmoid colon consistent with acute diverticulitis. No abscess. Followup after resolution of acute process is recommended. Increased density of the gallbladder consistent with sludge. Nonspecific low-attenuation lesion in the spleen probably benign.    Nm Hepatobiliary Including Gb 11/17/2014: 1. Patent cystic duct and common bile duct. No evidence of cholecystitis. 2. Poor clearance of radiotracer from the blood pool and poor excretion of radiotracer from the liver consistent hepatic dysfunction.    Dg Hip Unilat With Pelvis 2-3 Views Right 11/20/2014: Negative right hip radiographs.    Medical Consultants:    Dr. Johnathan Hausen, Surgery.  Anti-Infectives:    Cipro 11/16/14--->11/18/14, resumed 11/22/14  Flagyl 11/16/14--->11/18/14, resumed 11/22/14  Colbert Ewing 11/18/14--->11/22/14  Subjective:   Dominique [name] feels weak, complains of LE edema making it hard for  her to ambulate.  She continues to be sleepy/lethargic.  Abdominal pain improving.  4 loose stools documented over past 24 hours, occasionally bloody.  Objective:    Filed Vitals:   11/21/14 2217 11/22/14 0543 11/22/14 0745 11/22/14 0747  BP: 102/35 115/59    Pulse: 98 102    Temp: 98.4 F (36.9 C) 98.9 F (37.2 C)    TempSrc: Oral Oral    Resp: 18 18    Height:      Weight:      SpO2: 100% 90% 89% 98%    Intake/Output Summary (Last 24 hours) at 11/22/14 0809 Last data filed at 11/22/14 0544  Gross per 24 hour  Intake 844.17 ml  Output    500 ml  Net 344.17 ml    Exam: Gen:  NAD. lethargic Cardiovascular:  RRR, No M/R/G Respiratory:  Lungs diminished Gastrointestinal:  Abdomen with massive obesity, less tender Extremities:  + edema   Data Reviewed:    Labs: Basic Metabolic Panel:  Recent Labs Lab 11/16/14 1725 11/17/14 0545 11/18/14 0549 11/20/14 0607 11/21/14 0940  NA 137 136 137 130* 137  K 4.7 4.0 4.7 4.4 5.0  CL 101 99 99 96 102  CO2 30 31 30 31  32  GLUCOSE 102* 105* 124* 96 80  BUN 33* 29* 27* 21 14  CREATININE 0.87 0.74 0.71 0.48* 0.44*  CALCIUM 8.4 8.0* 8.1* 7.4* 7.7*   GFR Estimated Creatinine Clearance: 178.9 mL/min (by C-G formula based on Cr of 0.44). Liver Function Tests:  Recent Labs Lab 11/16/14 1725 11/17/14 0545 11/18/14 0549 11/20/14 0607 11/21/14 0940  AST 103* 75* 74* 81* 100*  ALT 103* 90* 91* 78* 71*  ALKPHOS 197* 196* 216* 264* 287*  BILITOT 4.1* 4.0* 4.6* 3.9* 3.1*  PROT 6.9 6.1 6.7 6.0 5.7*  ALBUMIN 3.0* 2.6* 2.8* 2.4* 2.2*    Recent Labs Lab 11/16/14 1725  LIPASE <10*   Coagulation profile  Recent Labs Lab 11/17/14 0545  INR 1.46    CBC:  Recent Labs Lab 11/16/14 1725  11/18/14 0549 11/19/14 0536 11/20/14 0607 11/21/14 0940 11/22/14 0538  WBC 7.8  < > 12.5* 8.9 9.4 8.9 10.6*  NEUTROABS 5.5  --   --   --   --   --   --   HGB 9.8*  < > 8.9* 8.9* 8.0* 7.4* 7.5*  HCT 37.3  < > 35.1* 36.0 31.0*  28.0* 27.8*  MCV 64.1*  < > 66.6* 67.4* 65.8* 65.0* 64.8*  PLT 155  < > 138* 139* 135* 156 196  < > = values in this interval not displayed. Sepsis Labs:  Recent Labs Lab 11/16/14 2008  11/19/14 0536 11/20/14 0607 11/21/14 0940 11/22/14 0538  WBC  --   < > 8.9 9.4 8.9 10.6*  LATICACIDVEN 1.73  --   --   --   --   --   < > = values in this interval not displayed. Microbiology No results found for this or any previous visit (from the past 240 hour(s)).   Medications:   . ertapenem  1 g Intravenous Daily  . fluticasone  2 puff Inhalation BID  . iron polysaccharides  150 mg Oral Daily  . saccharomyces boulardii  250 mg Oral BID   Continuous Infusions: . sodium chloride 50  mL/hr at 11/19/14 2153    Time spent: 25 minutes.    LOS: 6 days   [facility] Pager [phone]. If unable to reach me by pager, please call my cell phone at [phone].  *Please refer to [email or web address], password [email or web address] to get updated schedule on who will round on this patient, as hospitalists switch teams weekly. If 7PM-7AM, please contact night-coverage at [email or web address], password [email or web address] for any overnight needs.  11/22/2014, 8:09 AM

## 2014-11-22 NOTE — Progress Notes (Signed)
[facility] Progress Note     Subjective: Pt denies pain.  No N/V.  Says she has trouble ambulating due to "swelling in legs".  Says she has a 35y/o daughter and she takes care of her mom at home.    Objective: Vital signs in last 24 hours: Temp:  [98.4 F (36.9 C)-99 F (37.2 C)] 98.9 F (37.2 C) (03/01 0543) Pulse Rate:  [98-102] 102 (03/01 0543) Resp:  [18] 18 (03/01 0543) BP: (102-118)/(35-59) 115/59 mmHg (03/01 0543) SpO2:  [90 %-100 %] 90 % (03/01 0543) Last BM Date: 11/21/14  Intake/Output from previous day: 02/29 0701 - 03/01 0700 In: 1084.2 [P.O.:480; I.V.:604.2] Out: 650 [Urine:650] Intake/Output this shift:    PE: Gen:  Alert, NAD, pleasant Abd: Largely obese with extremely large panus, soft, NT/ND, +BS, no HSM   Lab Results:   Recent Labs  11/21/14 0940 11/22/14 0538  WBC 8.9 10.6*  HGB 7.4* 7.5*  HCT 28.0* 27.8*  PLT 156 196   BMET  Recent Labs  11/20/14 0607 11/21/14 0940  NA 130* 137  K 4.4 5.0  CL 96 102  CO2 31 32  GLUCOSE 96 80  BUN 21 14  CREATININE 0.48* 0.44*  CALCIUM 7.4* 7.7*   PT/INR No results for input(s): LABPROT, INR in the last 72 hours. CMP     Component Value Date/Time   NA 137 11/21/2014 0940   K 5.0 11/21/2014 0940   CL 102 11/21/2014 0940   CO2 32 11/21/2014 0940   GLUCOSE 80 11/21/2014 0940   BUN 14 11/21/2014 0940   CREATININE 0.44* 11/21/2014 0940   CALCIUM 7.7* 11/21/2014 0940   PROT 5.7* 11/21/2014 0940   ALBUMIN 2.2* 11/21/2014 0940   AST 100* 11/21/2014 0940   ALT 71* 11/21/2014 0940   ALKPHOS 287* 11/21/2014 0940   BILITOT 3.1* 11/21/2014 0940   GFRNONAA >90 11/21/2014 0940   GFRAA >90 11/21/2014 0940   Lipase     Component Value Date/Time   LIPASE <10* 11/16/2014 1725       Studies/Results: Dg Hip Unilat With Pelvis 2-3 Views Right  11/20/2014   CLINICAL DATA:  Fall and hospital room last night. Right hip pain. The patient was admitted for rectal bleeding.  EXAM: RIGHT HIP  (WITH PELVIS) 2-3 VIEWS  COMPARISON:  CT of the abdomen and pelvis without contrast 11/17/2014.  FINDINGS: The pelvis is intact. The right hip is located. No acute bone or soft tissue abnormalities are present.  IMPRESSION: Negative right hip radiographs.   Electronically Signed   By: San Morelle M.D.   On: 11/20/2014 15:25    Anti-infectives: Anti-infectives    Start     Dose/Rate Route Frequency Ordered Stop   11/18/14 1200  ertapenem (INVANZ) 1 g in sodium chloride 0.9 % 50 mL IVPB     1 g 100 mL/hr over 30 Minutes Intravenous Daily 11/18/14 1029     11/17/14 0600  metroNIDAZOLE (FLAGYL) IVPB 500 mg  Status:  Discontinued     500 mg 100 mL/hr over 60 Minutes Intravenous Every 8 hours 11/16/14 2301 11/18/14 1029   11/16/14 2359  ciprofloxacin (CIPRO) IVPB 400 mg  Status:  Discontinued     400 mg 200 mL/hr over 60 Minutes Intravenous Every 12 hours 11/16/14 2315 11/18/14 1029   11/16/14 2000  cefTRIAXone (ROCEPHIN) 1 g in dextrose 5 % 50 mL IVPB     1 g 100 mL/hr over 30 Minutes Intravenous  Once 11/16/14 1946 11/16/14 2145  11/16/14 2000  metroNIDAZOLE (FLAGYL) IVPB 500 mg     500 mg 100 mL/hr over 60 Minutes Intravenous  Once 11/16/14 1946 11/16/14 2246       Assessment/Plan Diverticulitis -IV abx Invanz Day #5 and finish a 10 day course of antibiotics  -Tolerating carb mod diet -Ambulate and IS Diarrhea -Likely due to diverticulitis -r/o c.diff, pending sample -probiotic -do not give imodium VTE proph -SCD's and heparin Morbid obesity with BMI 85.2  F/u as needed in the office.  Can follow up with her PCP upon discharge.  Will sign off.    LOS: 6 days    Dominique [name] 11/22/2014, 7:17 AM Pager: [phone]

## 2014-11-23 DIAGNOSIS — R109 Unspecified abdominal pain: Secondary | ICD-10-CM | POA: Insufficient documentation

## 2014-11-23 DIAGNOSIS — R1084 Generalized abdominal pain: Secondary | ICD-10-CM

## 2014-11-23 MED ORDER — CIPROFLOXACIN HCL 500 MG PO TABS: 500.0000 mg | ORAL_TABLET | Freq: Two times a day (BID) | ORAL | Status: DC

## 2014-11-23 MED ORDER — SACCHAROMYCES BOULARDII 250 MG PO CAPS: 250.0000 mg | ORAL_CAPSULE | Freq: Two times a day (BID) | ORAL | Status: DC

## 2014-11-23 MED ORDER — METRONIDAZOLE 500 MG PO TABS: 500.0000 mg | ORAL_TABLET | Freq: Three times a day (TID) | ORAL | Status: DC

## 2014-11-23 NOTE — Discharge Instructions (Signed)

## 2014-11-23 NOTE — Progress Notes (Signed)
Physical Therapy Treatment Patient Details Name: Dominique [name] MRN: [id number] DOB: 1981-02-20 Today's Date: 11/23/2014    History of Present Illness Pt admitted with abdominal pain with diverticulitis and possible acalculus cholecystitis. Hospital course complicated by hypotension, rectal bleeding and diarrhea.  PMH: asthma, anemia and morbid obesity.    PT Comments    Improved mobility this session. Pt tolerated activity fairly well. Fatigued fairly quickly/easily with ambulation-seated rest break needed after 1st 50 feet. No assistive device needed. Pt to d/c home later today.   Follow Up Recommendations  Home health PT;Supervision/Assistance - 24 hour (HHPT not an option per CM sticky note)     Equipment Recommendations  None recommended by PT    Recommendations for Other Services       Precautions / Restrictions Precautions Precautions: Fall Restrictions Weight Bearing Restrictions: No    Mobility  Bed Mobility Overal bed mobility: Needs Assistance       Supine to sit: Min guard     General bed mobility comments: pt oob sitting on window seat  Transfers Overall transfer level: Needs assistance   Transfers: Sit to/from Stand Sit to Stand: Supervision Stand pivot transfers: Supervision       General transfer comment: supervision for safety  Ambulation/Gait Ambulation/Gait assistance: Min guard Ambulation Distance (Feet): 50 Feet (x2) Assistive device: None Gait Pattern/deviations: Step-through pattern     General Gait Details: close guard for safety. Seated rest break needed due to fatigue.    Stairs            Wheelchair Mobility    Modified Rankin (Stroke Patients Only)       Balance                                    Cognition Arousal/Alertness: Awake/alert Behavior During Therapy: WFL for tasks assessed/performed Overall Cognitive Status: Within Functional Limits for tasks assessed                       Exercises      General Comments        Pertinent Vitals/Pain Pain Assessment: No/denies pain    Home Living                      Prior Function            PT Goals (current goals can now be found in the care plan section) Progress towards PT goals: Progressing toward goals    Frequency  Min 3X/week    PT Plan Current plan remains appropriate    Co-evaluation             End of Session   Activity Tolerance: Patient tolerated treatment well Patient left: in chair;with call bell/phone within reach     Time: 1330-1347 PT Time Calculation (min) (ACUTE ONLY): 17 min  Charges:  $Gait Training: 8-22 mins                    G Codes:      Weston Anna, MPT Pager: [phone]

## 2014-11-23 NOTE — Progress Notes (Signed)
Occupational Therapy Treatment Patient Details Name: Dominique [name] MRN: [id number] DOB: 10/13/1980 Today's Date: 11/23/2014    History of present illness Pt admitted with abdominal pain with diverticulitis and possible acalculus cholecystitis. Hospital course complicated by hypotension, rectal bleeding and diarrhea.  PMH: asthma, anemia and morbid obesity.   OT comments  Much improved!  Follow Up Recommendations       Equipment Recommendations  None recommended by OT    Recommendations for Other Services      Precautions / Restrictions Restrictions Weight Bearing Restrictions: No       Mobility Bed Mobility Overal bed mobility: Needs Assistance       Supine to sit: Min guard        Transfers Overall transfer level: Needs assistance   Transfers: Sit to/from Stand Sit to Stand: Supervision Stand pivot transfers: Supervision            Balance                                   ADL               Lower Body Bathing: Minimal assistance;Sit to/from stand           Toilet Transfer: Supervision/safety;Comfort height toilet   Toileting- Clothing Manipulation and Hygiene: Supervision/safety;Sit to/from stand         General ADL Comments: pt much improved!      Vision                     Perception     Praxis      Cognition   Behavior During Therapy: WFL for tasks assessed/performed Overall Cognitive Status: Within Functional Limits for tasks assessed                       Extremity/Trunk Assessment               Exercises     Shoulder Instructions       General Comments      Pertinent Vitals/ Pain          Home Living                                          Prior Functioning/Environment              Frequency       Progress Toward Goals  OT Goals(current goals can now be found in the care plan section)  Progress towards OT goals: Progressing toward goals      Plan Discharge plan remains appropriate    Co-evaluation                 End of Session     Activity Tolerance Patient tolerated treatment well   Patient Left in bed   Nurse Communication Mobility status        Time: 1045-1110 OT Time Calculation (min): 25 min  Charges: OT General Charges $OT Visit: 1 Procedure OT Treatments $Self Care/Home Management : 23-37 mins  Holdan Stucke D 11/23/2014, 11:12 AM

## 2014-11-29 NOTE — Discharge Summary (Signed)
Physician Discharge Summary  Dominique [name] LHT:[id number] DOB: June 27, 1981 DOA: 11/16/2014  PCP: Annye Asa, MD  Admit date: 11/16/2014 Discharge date: 11/29/2014  Recommendations for Outpatient Follow-up:  1. Continue ciprofloxacin and Flagyl for 9 days on discharge. Follow-up with primary care physician per scheduled appointment. 2. Pt will follow up with PCP per scheduled appt   Discharge Diagnoses:  Principal Problem:   Diverticulitis of colon Active Problems:   Morbid obesity   OSA (obstructive sleep apnea)   Hepatic steatosis   Elevated LFTs   Rectal bleeding   Hypotension   Fall   Abdominal pain    Discharge Condition: stable   Diet recommendation: as tolerated   History of present illness:  34 y.o. female with a PMH of morbid obesity, asthma, probable sleep apnea, chronic anemia who was admitted on 11/16/14 with a 3-4 week history of constipation and abdominal pain. In the ED, an abdominal ultrasound showed acalculus cholecystitis. CT of abdomen showed diverticulitis, and HIDA scan was negative for cholecystitis. She has been treated with IV antibiotics but hospital course complicated by her morbid obesity and a fall. Surgery has followed her clinical course, signed off 11/22/14 with recommendations to treat diverticulitis with a 10 day course of antibiotics.  Assessment/Plan:   Principal Problem:  Abdominal pain secondary to diverticultis / Possible acalculous cholecystitis / Elevated LFTs / Rectal bleeding / diarrhea - S/P ultrasound concerning for acalculous cholecystitis & CT showing diverticulitis. - HIDA scan showed a patent cystic duct and common bile duct with no evidence of cholecystitis. - Continue antibiotics for a total of 10 days, she was on Invanz per surgery. Switched to oral Cipro/Flagyl on discharge. - Surgery not indicated at this time. - Diet advanced regular, she tolerates it. - Diarrhea improved. C.diff ordered but since no other stool C>diff  never consulted.   Active Problems:  Hypotension / Fall - BP transiently low 11/19/14 and she had a fall on that date, likely related to orthostatic changes. - Left hip/pelvic x ray negative. - BP stable at the time of discharge.   Asthma - Stable, continue BDs as needed.   H/O diastolic CHF - Compensated at present, but reports LE swelling making it hard for her to ambulate. - Lasix 20 mg daily ordered, she will follow up with PCP and determine for how long she will need to continue.    Morbid obesity / hepatic steatosis  - BMI is 85.2. Dietician provided diet education 11/18/14. - Elevated LFTs noted, likely from hepatic steatosis.   OSA (obstructive sleep apnea) / OHS - Has appointment 01/08/15 for CPap titration.   DVT Prophylaxis - Heparin sub Q ordered   Code Status: Full. Family Communication: Multiple family updated at the bedside 11/17/14.   IV Access:    Peripheral IV   Procedures and diagnostic studies:   US Abdomen Complete 11/16/2014: Sonographic survey of the abdomen demonstrates evidence of acalculous cholecystitis with positive sonographic Murphy sign and circumferential gallbladder wall thickening/striations. There is an absence of gallbladder stones/sludge. If there is concern for confirming ductal obstruction, correlation with HIDA study may be useful. Evidence of hepatic steatosis.   Ct Abdomen Pelvis W Contrast 11/17/2014: Inflammatory changes in the distal descending and sigmoid colon consistent with acute diverticulitis. No abscess. Followup after resolution of acute process is recommended. Increased density of the gallbladder consistent with sludge. Nonspecific low-attenuation lesion in the spleen probably benign.   Nm Hepatobiliary Including Gb 11/17/2014: 1. Patent cystic duct and common bile duct. No evidence of  cholecystitis. 2. Poor clearance of radiotracer from the blood pool and poor excretion of radiotracer from the liver consistent  hepatic dysfunction.   Dg Hip Unilat With Pelvis 2-3 Views Right 11/20/2014: Negative right hip radiographs.   Medical Consultants:    Dr. Johnathan Hausen, Surgery.  Anti-Infectives:    Cipro 11/16/14--->11/18/14, resumed 11/22/14  Flagyl 11/16/14--->11/18/14, resumed 11/22/14  Colbert Ewing 11/18/14--->11/22/14   Signed:  Leisa Lenz, MD  [facility] 11/29/2014, 8:53 PM  Pager #: [phone]   Discharge Exam: Filed Vitals:   11/23/14 1400  BP: 136/67  Pulse: 111  Temp: 98.4 F (36.9 C)  Resp: 18   Filed Vitals:   11/22/14 2154 11/23/14 0514 11/23/14 0747 11/23/14 1400  BP: 101/56 106/50  136/67  Pulse: 99 109  111  Temp: 98.9 F (37.2 C) 99.4 F (37.4 C)  98.4 F (36.9 C)  TempSrc: Oral Oral  Oral  Resp: 16 18  18   Height:      Weight:      SpO2: 90% 90% 91% 97%    General: Pt is alert, follows commands appropriately, not in acute distress Cardiovascular: Regular rate and rhythm, S1/S2 +, no murmurs Respiratory: Clear to auscultation bilaterally, no wheezing, no crackles, no rhonchi Abdominal: Soft, non tender, non distended, bowel sounds +, no guarding Extremities: no edema, no cyanosis, pulses palpable bilaterally DP and PT Neuro: Grossly nonfocal  Discharge Instructions  Discharge Instructions    Call MD for:  difficulty breathing, headache or visual disturbances    Complete by:  As directed      Call MD for:  persistant nausea and vomiting    Complete by:  As directed      Call MD for:  severe uncontrolled pain    Complete by:  As directed      Diet - low sodium heart healthy    Complete by:  As directed      Discharge instructions    Complete by:  As directed   1. Continue ciprofloxacin and Flagyl for 9 days on discharge. Follow-up with primary care physician per scheduled appointment.     Increase activity slowly    Complete by:  As directed             Medication List    STOP taking these medications        GOODY HEADACHE PO      naproxen sodium 220 MG tablet  Commonly known as:  ANAPROX     polyethylene glycol packet  Commonly known as:  MIRALAX / GLYCOLAX      TAKE these medications        beclomethasone 80 MCG/ACT inhaler  Commonly known as:  QVAR  Inhale 2 puffs into the lungs 2 (two) times daily.     ciprofloxacin 500 MG tablet  Commonly known as:  CIPRO  Take 1 tablet (500 mg total) by mouth 2 (two) times daily.     ferrous sulfate 325 (65 FE) MG tablet  Take 1 tablet (325 mg total) by mouth daily with breakfast.     furosemide 20 MG tablet  Commonly known as:  LASIX  Take 1 tablet (20 mg total) by mouth daily.     metroNIDAZOLE 500 MG tablet  Commonly known as:  FLAGYL  Take 1 tablet (500 mg total) by mouth every 8 (eight) hours.     naproxen 500 MG tablet  Commonly known as:  NAPROSYN  Take 1 tablet (500 mg total) by mouth 2 (two) times daily  with a meal.     PROAIR HFA 108 (90 BASE) MCG/ACT inhaler  Generic drug:  albuterol  INHALE 2 PUFFS INTO THE LUNGS EVERY 6 HOURS AS NEEDED FOR WHEEZING OR SHORTNESS OF BREATH     albuterol (2.5 MG/3ML) 0.083% nebulizer solution  Commonly known as:  PROVENTIL  Take 3 mLs (2.5 mg total) by nebulization every 6 (six) hours as needed for wheezing or shortness of breath.     saccharomyces boulardii 250 MG capsule  Commonly known as:  FLORASTOR  Take 1 capsule (250 mg total) by mouth 2 (two) times daily.       Follow-up Information    Follow up with Annye Asa, MD. Schedule an appointment as soon as possible for a visit in 1 week.   Specialty:  Family Medicine   Contact information:   [place] [id number] [phone]        The results of significant diagnostics from this hospitalization (including imaging, microbiology, ancillary and laboratory) are listed below for reference.    Significant Diagnostic Studies: Dg Abd 1 View  11/12/2014   CLINICAL DATA:  Lower and RIGHT abdominal pain and  constipation for several days.  EXAM: ABDOMEN - 1 VIEW  COMPARISON:  Acute abdominal series March 12, 2013  FINDINGS: The bowel gas pattern is normal. Mild amount of retained large bowel stool. No radio-opaque calculi or other significant radiographic abnormality are seen. Large body habitus.  IMPRESSION: Normal bowel gas pattern. Mild amount of retained large bowel stool.   Electronically Signed   By: Elon Alas   On: 11/12/2014 00:34   Nm Hepatobiliary Including Gb  11/17/2014   CLINICAL DATA:  Elevated bili Truddie Coco.  Cholelithiasis.  EXAM: NUCLEAR MEDICINE HEPATOBILIARY IMAGING  TECHNIQUE: Sequential images of the abdomen were obtained out to 60 minutes following intravenous administration of radiopharmaceutical.  RADIOPHARMACEUTICALS:  7.5  [place] WF-09N Choletec  COMPARISON:  Ultrasound 11/16/2014  FINDINGS: There is delayed extraction of radiotracer from the blood pool. There is homogeneous uptake within the liver. There is delayed clearance of counts from the liver into the biliary tree. Counts are evident within the bowel by 50 minutes. At 60 minutes, the gallbladder has not filled. Morphine was administered. The gallbladder subsequent filled over the next 30 minutes.  IMPRESSION: 1. Patent cystic duct and common bile duct. No evidence of cholecystitis. 2. Poor clearance of radiotracer from the blood pool and poor excretion of radiotracer from the liver consistent hepatic dysfunction.   Electronically Signed   By: Suzy Bouchard M.D.   On: 11/17/2014 16:30   US Abdomen Complete  11/16/2014   CLINICAL DATA:  34 year old female with general abdominal pain for 1 month. History of constipation. Nausea and vomiting with pain for 5 days.  EXAM: ULTRASOUND ABDOMEN COMPLETE  COMPARISON:  Plain film 11/12/2014, ultrasound 03/12/2013  FINDINGS: Gallbladder: Anechoic fluid within the gallbladder lumen with no stones identified. Gallbladder wall thickening measuring as great as 2 mm to 3 mm. Sonographic  Murphy's sign positive.  Common bile duct: Diameter: 4.6 mm  Liver: Heterogeneous echotexture of liver parenchyma.  IVC: No abnormality visualized.  Pancreas: Visualized portion unremarkable.  Spleen: Size and appearance within normal limits.  Right Kidney: Length: 13.1 cm. Echogenicity within normal limits. No mass or hydronephrosis visualized.  Left Kidney: Length: 13.3 cm. Echogenicity within normal limits. No mass or hydronephrosis visualized.  Abdominal aorta: No aneurysm visualized.  Other findings: None.  IMPRESSION: Sonographic survey of the abdomen  demonstrates evidence of acalculous cholecystitis with positive sonographic Murphy sign and circumferential gallbladder wall thickening/striations. There is an absence of gallbladder stones/sludge. If there is concern for confirming ductal obstruction, correlation with HIDA study may be useful.  Evidence of hepatic steatosis.  Signed,  Dulcy Fanny. Earleen Newport, DO  [facility]  [facility]   Electronically Signed   By: Corrie Mckusick D.O.   On: 11/16/2014 20:38   Ct Abdomen Pelvis W Contrast  11/17/2014   CLINICAL DATA:  Abdominal pain. Elevated liver function tests. Gallbladder wall thickening on ultrasound.  EXAM: CT ABDOMEN AND PELVIS WITH CONTRAST  TECHNIQUE: Multidetector CT imaging of the abdomen and pelvis was performed using the standard protocol following bolus administration of intravenous contrast.  CONTRAST:  138m OMNIPAQUE IOHEXOL 300 MG/ML  SOLN  COMPARISON:  Ultrasound abdomen 11/16/2014.  FINDINGS: Examination is technically limited due to body habitus. Contrast bolus is limited.  The lung bases are clear, allowing for motion artifact.  Vague low-attenuation lesion in the spleen. This probably represents a benign structure such as hemangioma or cyst. The gallbladder is contracted and of increased density consistent with sludge. No bile duct dilatation. Liver, pancreas, adrenal glands, kidneys, abdominal  aorta, inferior vena cava, and retroperitoneal lymph nodes are unremarkable. Stomach and small bowel appear normal for degree of distention. Diffusely stool-filled colon. The lower descending and sigmoid colon demonstrate diffuse wall thickening with pericolonic inflammation most likely to represent diverticulitis although focal colitis could also have this appearance. There is pericolonic infiltration and edema without evidence of abscess. Followup after resolution of acute process is suggested to exclude underlying colonic neoplasm. No free air or free fluid in the abdomen.  Pelvis: Appendix is normal. The uterus and ovaries are not enlarged. No free or loculated pelvic fluid collections. No pelvic mass or lymphadenopathy. Bladder wall is not thickened. No destructive bone lesions.  IMPRESSION: Inflammatory changes in the distal descending and sigmoid colon consistent with acute diverticulitis. No abscess. Followup after resolution of acute process is recommended. Increased density of the gallbladder consistent with sludge. Nonspecific low-attenuation lesion in the spleen probably benign.   Electronically Signed   By: WLucienne CapersM.D.   On: 11/17/2014 01:03   Dg Hip Unilat With Pelvis 2-3 Views Right  11/20/2014   CLINICAL DATA:  Fall and hospital room last night. Right hip pain. The patient was admitted for rectal bleeding.  EXAM: RIGHT HIP (WITH PELVIS) 2-3 VIEWS  COMPARISON:  CT of the abdomen and pelvis without contrast 11/17/2014.  FINDINGS: The pelvis is intact. The right hip is located. No acute bone or soft tissue abnormalities are present.  IMPRESSION: Negative right hip radiographs.   Electronically Signed   By: CSan MorelleM.D.   On: 11/20/2014 15:25    Microbiology: No results found for this or any previous visit (from the past 240 hour(s)).   Labs: Basic Metabolic Panel: No results for input(s): NA, K, CL, CO2, GLUCOSE, BUN, CREATININE, CALCIUM, MG, PHOS in the last 168  hours. Liver Function Tests: No results for input(s): AST, ALT, ALKPHOS, BILITOT, PROT, ALBUMIN in the last 168 hours. No results for input(s): LIPASE, AMYLASE in the last 168 hours. No results for input(s): AMMONIA in the last 168 hours. CBC: No results for input(s): WBC, NEUTROABS, HGB, HCT, MCV, PLT in the last 168 hours. Cardiac Enzymes: No results for input(s): CKTOTAL, CKMB, CKMBINDEX, TROPONINI in the last 168 hours. BNP: BNP (last 3 results) No results for input(s): BNP in  the last 8760 hours.  ProBNP (last 3 results) No results for input(s): PROBNP in the last 8760 hours.  CBG: No results for input(s): GLUCAP in the last 168 hours.  Time coordinating discharge: Over 30 minutes

## 2014-12-01 ENCOUNTER — Ambulatory Visit (INDEPENDENT_AMBULATORY_CARE_PROVIDER_SITE_OTHER): Payer: Self-pay | Admitting: Family Medicine

## 2014-12-01 ENCOUNTER — Ambulatory Visit (HOSPITAL_BASED_OUTPATIENT_CLINIC_OR_DEPARTMENT_OTHER): Payer: Medicaid Other

## 2014-12-01 ENCOUNTER — Encounter: Payer: Self-pay | Admitting: Family Medicine

## 2014-12-01 VITALS — BP 140/80 | HR 105 | Temp 97.9°F | Resp 17 | Wt >= 6400 oz

## 2014-12-01 DIAGNOSIS — R609 Edema, unspecified: Secondary | ICD-10-CM

## 2014-12-01 DIAGNOSIS — K5732 Diverticulitis of large intestine without perforation or abscess without bleeding: Secondary | ICD-10-CM

## 2014-12-01 DIAGNOSIS — E049 Nontoxic goiter, unspecified: Secondary | ICD-10-CM

## 2014-12-01 DIAGNOSIS — E010 Iodine-deficiency related diffuse (endemic) goiter: Secondary | ICD-10-CM

## 2014-12-01 DIAGNOSIS — E8770 Fluid overload, unspecified: Secondary | ICD-10-CM

## 2014-12-01 LAB — CBC WITH DIFFERENTIAL/PLATELET
BASOS PCT: 0.7 % (ref 0.0–3.0)
Basophils Absolute: 0 10*3/uL (ref 0.0–0.1)
EOS PCT: 2.2 % (ref 0.0–5.0)
Eosinophils Absolute: 0.2 10*3/uL (ref 0.0–0.7)
HCT: 29 % — ABNORMAL LOW (ref 36.0–46.0)
Hemoglobin: 7.9 g/dL — CL (ref 12.0–15.0)
LYMPHS PCT: 26.7 % (ref 12.0–46.0)
Lymphs Abs: 1.9 10*3/uL (ref 0.7–4.0)
MCHC: 27.3 g/dL — ABNORMAL LOW (ref 30.0–36.0)
MCV: 66 fl — ABNORMAL LOW (ref 78.0–100.0)
MONOS PCT: 17.6 % — AB (ref 3.0–12.0)
Monocytes Absolute: 1.3 10*3/uL — ABNORMAL HIGH (ref 0.1–1.0)
NEUTROS PCT: 52.8 % (ref 43.0–77.0)
Neutro Abs: 3.8 10*3/uL (ref 1.4–7.7)
RBC: 4.4 Mil/uL (ref 3.87–5.11)
RDW: 27.3 % — ABNORMAL HIGH (ref 11.5–15.5)
WBC: 7.2 10*3/uL (ref 4.0–10.5)

## 2014-12-01 LAB — BASIC METABOLIC PANEL
BUN: 10 mg/dL (ref 6–23)
CO2: 28 mEq/L (ref 19–32)
CREATININE: 0.53 mg/dL (ref 0.40–1.20)
Calcium: 8.5 mg/dL (ref 8.4–10.5)
Chloride: 103 mEq/L (ref 96–112)
GFR: 169.33 mL/min (ref >60.00)
Glucose, Bld: 85 mg/dL (ref 70–99)
Potassium: 4 mEq/L (ref 3.5–5.1)
Sodium: 135 mEq/L (ref 135–145)

## 2014-12-01 MED ORDER — TORSEMIDE 20 MG PO TABS
20.0000 mg | ORAL_TABLET | Freq: Every day | ORAL | Status: DC
Start: 2014-12-01 — End: 2015-06-16

## 2014-12-01 NOTE — Assessment & Plan Note (Signed)
Chronic problem.  Deteriorated.  Pt continues to gain weight.  Unclear if it is all fluid retention at this time.  Will follow closely.

## 2014-12-01 NOTE — Assessment & Plan Note (Signed)
New to provider.  Pt was hospitalized and tx'd w/ IV abx prior to converting to oral meds.  Pt reports abd pain has resolved.  Pt to continue meds as directed.  Reviewed supportive care and red flags that should prompt return.  Pt expressed understanding and is in agreement w/ plan.

## 2014-12-01 NOTE — Assessment & Plan Note (Signed)
Pt never got [place] as ordered.  Will re-order today and stressed the importance of f/u.

## 2014-12-01 NOTE — Progress Notes (Signed)
Pre visit review using our clinic review tool, if applicable. No additional management support is needed unless otherwise documented below in the visit note. 

## 2014-12-01 NOTE — Patient Instructions (Signed)
Follow up next week to recheck swelling We'll notify you of your lab results and make any changes if needed STOP the Lasix START the Torsemide daily We'll call you with your ultrasound appt Make sure you finish the antibiotics as directed Call with any questions or concerns Hang in there!

## 2014-12-01 NOTE — Assessment & Plan Note (Signed)
New.  Pt has gained 15 lbs since admission.  Pt retaining fluid in face, abd, LEs.  No relief w/ lasix.  Will switch diuretic to Torsamide.  Check labs.  Reviewed supportive care and red flags that should prompt return.  Pt expressed understanding and is in agreement w/ plan.

## 2014-12-01 NOTE — Progress Notes (Signed)
   Subjective:    Patient ID: Dominique [name], female    DOB: Mar 02, 1981, 33 y.o.   MRN: [id number]  [facility] f/u- pt was admitted 2/24-3/2 w/ acalculus cholecystitis and diverticulitis.  Was initially on IV Invanz and switched to PO Cipro/Flagyl.  Pt reports abd pain has resolved, 'it feels like everything is back to normal'.  Pt reports that since Saturday she has been excessively swollen and retaining fluid in her feet, legs, abd, and face.  Pt reports she has been taking 1-2 Lasix w/o improvement in fluid retention.  Denies SOB.  No CP.  No recent increase in salt intake.  Is up 15 lbs since 2/24 admission weight.  Review of Systems For ROS see HPI     Objective:   Physical Exam  Constitutional: She is oriented to person, place, and time. She appears well-developed and well-nourished. No distress.  Morbidly obese  HENT:  Head: Normocephalic and atraumatic.  Eyes: Conjunctivae and EOM are normal. Pupils are equal, round, and reactive to light.  Neck: Normal range of motion. Neck supple. Thyromegaly present.  Cardiovascular: Normal rate, regular rhythm, normal heart sounds and intact distal pulses.   No murmur heard. Pulmonary/Chest: Effort normal and breath sounds normal. No respiratory distress.  Abdominal: Soft. She exhibits distension. There is no tenderness.  Musculoskeletal: She exhibits edema (1-2+ edema of bilateral LEs).  Lymphadenopathy:    She has no cervical adenopathy.  Neurological: She is alert and oriented to person, place, and time.  Skin: Skin is warm and dry.  Psychiatric: She has a normal mood and affect. Her behavior is normal.  Vitals reviewed.         Assessment & Plan:

## 2014-12-02 ENCOUNTER — Other Ambulatory Visit: Payer: Self-pay | Admitting: General Practice

## 2014-12-02 LAB — TSH: TSH: 3.71 u[IU]/mL (ref 0.35–4.50)

## 2014-12-02 MED ORDER — FERROUS SULFATE 325 (65 FE) MG PO TABS: 325.0000 mg | ORAL_TABLET | Freq: Every day | ORAL | Status: DC

## 2014-12-06 ENCOUNTER — Ambulatory Visit (HOSPITAL_BASED_OUTPATIENT_CLINIC_OR_DEPARTMENT_OTHER)
Admission: RE | Admit: 2014-12-06 | Discharge: 2014-12-06 | Disposition: A | Discharge status: Home / Self Care | Payer: Medicaid Other | Source: Ambulatory Visit | Attending: Family Medicine | Admitting: Family Medicine

## 2014-12-06 ENCOUNTER — Ambulatory Visit (INDEPENDENT_AMBULATORY_CARE_PROVIDER_SITE_OTHER): Payer: Self-pay | Admitting: Family Medicine

## 2014-12-06 ENCOUNTER — Encounter: Payer: Self-pay | Admitting: Family Medicine

## 2014-12-06 VITALS — BP 130/80 | HR 100 | Temp 98.1°F | Resp 17 | Wt >= 6400 oz

## 2014-12-06 DIAGNOSIS — E079 Disorder of thyroid, unspecified: Secondary | ICD-10-CM | POA: Insufficient documentation

## 2014-12-06 DIAGNOSIS — E010 Iodine-deficiency related diffuse (endemic) goiter: Secondary | ICD-10-CM

## 2014-12-06 DIAGNOSIS — M545 Low back pain, unspecified: Secondary | ICD-10-CM | POA: Insufficient documentation

## 2014-12-06 DIAGNOSIS — R609 Edema, unspecified: Secondary | ICD-10-CM

## 2014-12-06 DIAGNOSIS — E049 Nontoxic goiter, unspecified: Secondary | ICD-10-CM

## 2014-12-06 DIAGNOSIS — E8770 Fluid overload, unspecified: Secondary | ICD-10-CM

## 2014-12-06 LAB — BASIC METABOLIC PANEL
BUN: 8 mg/dL (ref 6–23)
CHLORIDE: 101 meq/L (ref 96–112)
CO2: 36 mEq/L — ABNORMAL HIGH (ref 19–32)
CREATININE: 0.6 mg/dL (ref 0.40–1.20)
Calcium: 8.9 mg/dL (ref 8.4–10.5)
GFR: 146.74 mL/min (ref >60.00)
GLUCOSE: 81 mg/dL (ref 70–99)
Potassium: 3.6 mEq/L (ref 3.5–5.1)
Sodium: 140 mEq/L (ref 135–145)

## 2014-12-06 MED ORDER — CYCLOBENZAPRINE HCL 10 MG PO TABS: 10.0000 mg | ORAL_TABLET | Freq: Three times a day (TID) | ORAL | Status: DC | PRN

## 2014-12-06 NOTE — Assessment & Plan Note (Signed)
Due to nodularity of R thyroid stressed need for imaging to assess what is going on.  Pt has already missed 2 US's, dating back to last year.  After discussion today, pt plans to follow through w/ imaging study.

## 2014-12-06 NOTE — Progress Notes (Signed)
   Subjective:    Patient ID: Dominique [name], female    DOB: 1981/04/11, 34 y.o.   MRN: [id number]  HPI Edema- pt has lost 8 lbs of fluid since switching to Torsemide.  Pt reports new medication 'works'.  Still w/ some fluid retention.  Pt just started taking iron yesterday.  No CP, SOB, HAs, abd pain.  Lumbar back pain- pt reports she is unable to sleep due to chronic pain.  Able to fall asleep but will then wake repeatedly throughout the night.  No relief w/ Naproxen.  Pt reports previous relief w/ flexeril.  Thyromegaly- pt never had thyroid US when ordered in August 2015.  Missed her [place] last week.  Pt is asking if she needs to f/u w/ this today.    Review of Systems For ROS see HPI     Objective:   Physical Exam  Constitutional: She is oriented to person, place, and time. She appears well-developed and well-nourished. No distress.  Morbidly obese  HENT:  Head: Normocephalic and atraumatic.  Eyes: Conjunctivae and EOM are normal. Pupils are equal, round, and reactive to light.  Neck: Normal range of motion. Neck supple. Thyromegaly (w/ nodularity on R) present.  Cardiovascular: Normal rate, regular rhythm, normal heart sounds and intact distal pulses.   No murmur heard. Pulmonary/Chest: Effort normal and breath sounds normal. No respiratory distress.  Abdominal: Soft. She exhibits no distension. There is no tenderness.  Musculoskeletal: She exhibits edema (much less edema of LEs bilaterally).  Lymphadenopathy:    She has no cervical adenopathy.  Neurological: She is alert and oriented to person, place, and time.  Skin: Skin is warm and dry.  Psychiatric: She has a normal mood and affect. Her behavior is normal.  Vitals reviewed.         Assessment & Plan:

## 2014-12-06 NOTE — Progress Notes (Signed)
Pre visit review using our clinic review tool, if applicable. No additional management support is needed unless otherwise documented below in the visit note. 

## 2014-12-06 NOTE — Patient Instructions (Signed)
Follow up in 2-3 months to recheck anemia We'll notify you of your lab results and make any changes if needed Continue the iron and the new fluid pill Start the Flexeril at night for back pain/spasm We'll let you know about the thyroid ultrasound as soon as we have the results Call with any questions or concerns Hang in there!!

## 2014-12-06 NOTE — Assessment & Plan Note (Signed)
Recurrent problem for pt.  Naproxen is no longer providing relief.  Pt asking for refill on Flexeril as this has worked in the past.  Suspect this is all weight related and again discussed the importance of healthy diet and regular exercise to improve overall health.  Will follow.

## 2014-12-06 NOTE — Assessment & Plan Note (Signed)
Improved since switching to Torsemide.  Discussed a simplified version of oncotic pressures and how her anemia is contributing to her edema.  Pt started iron supplement yesterday- encouraged her to continue.  Will follow closely.

## 2014-12-07 ENCOUNTER — Other Ambulatory Visit: Payer: Self-pay | Admitting: Family Medicine

## 2014-12-07 ENCOUNTER — Telehealth: Payer: Self-pay | Admitting: Family Medicine

## 2014-12-07 DIAGNOSIS — E041 Nontoxic single thyroid nodule: Secondary | ICD-10-CM

## 2014-12-07 NOTE — Telephone Encounter (Signed)
Caller name:Glasscock Corlene Relation to ZB:FMZU Call back number:[phone] Pharmacy:  Reason for call: pt is returning your call regarding her lab results please call back

## 2014-12-07 NOTE — Telephone Encounter (Signed)
Pt.notified

## 2014-12-08 ENCOUNTER — Ambulatory Visit: Payer: Medicaid Other | Admitting: Pulmonary Disease

## 2014-12-19 ENCOUNTER — Telehealth: Payer: Self-pay | Admitting: Family Medicine

## 2014-12-19 ENCOUNTER — Other Ambulatory Visit: Payer: Self-pay | Admitting: Family Medicine

## 2014-12-19 NOTE — Telephone Encounter (Signed)
Caller name: Hortense Relation to pt: self Call back number: [phone] Pharmacy:  Reason for call:   Patient states that she now has [place] medicaid, not [employer] and would still like to be referred to the specialist that Dr. Birdie Riddle wanted to refer her to

## 2014-12-20 ENCOUNTER — Other Ambulatory Visit: Payer: Self-pay | Admitting: Family Medicine

## 2014-12-20 DIAGNOSIS — E041 Nontoxic single thyroid nodule: Secondary | ICD-10-CM

## 2014-12-20 NOTE — Telephone Encounter (Signed)
This referral to ENT was placed again today, due to pt having new insurance.

## 2014-12-20 NOTE — Telephone Encounter (Signed)
Lm on vm awaiting return call, need new insurance ID

## 2014-12-29 ENCOUNTER — Encounter: Payer: Self-pay | Admitting: Pulmonary Disease

## 2014-12-29 ENCOUNTER — Ambulatory Visit: Payer: Medicaid Other | Admitting: Pulmonary Disease

## 2014-12-29 ENCOUNTER — Ambulatory Visit (INDEPENDENT_AMBULATORY_CARE_PROVIDER_SITE_OTHER): Payer: Medicaid Other | Admitting: Pulmonary Disease

## 2014-12-29 VITALS — BP 146/84 | HR 146 | Temp 98.2°F | Ht 63.0 in | Wt >= 6400 oz

## 2014-12-29 DIAGNOSIS — R000 Tachycardia, unspecified: Secondary | ICD-10-CM | POA: Insufficient documentation

## 2014-12-29 DIAGNOSIS — G4733 Obstructive sleep apnea (adult) (pediatric): Secondary | ICD-10-CM

## 2014-12-29 DIAGNOSIS — I471 Supraventricular tachycardia: Secondary | ICD-10-CM | POA: Diagnosis not present

## 2014-12-29 LAB — BASIC METABOLIC PANEL
BUN: 7 mg/dL (ref 6–23)
CO2: 29 mEq/L (ref 19–32)
Calcium: 8.9 mg/dL (ref 8.4–10.5)
Chloride: 99 mEq/L (ref 96–112)
Creat: 0.52 mg/dL (ref 0.50–1.10)
GLUCOSE: 75 mg/dL (ref 70–99)
POTASSIUM: 4.1 meq/L (ref 3.5–5.3)
Sodium: 140 mEq/L (ref 135–145)

## 2014-12-29 LAB — MAGNESIUM: Magnesium: 1.6 mg/dL (ref 1.5–2.5)

## 2014-12-29 NOTE — Patient Instructions (Addendum)
EKG shows normal rhythm CPAP titration study on 4/17 - I will get you a cpap machine after this Blood work today Take water pill daily Take potassium pills with this ( 20 meq) to prevent cramping

## 2014-12-29 NOTE — Assessment & Plan Note (Signed)
EKg -  Chk BMET, mag levels

## 2014-12-29 NOTE — Progress Notes (Signed)
   Subjective:    Patient ID: Dominique [name], female    DOB: 1981-04-05, 34 y.o.   MRN: [id number]  HPI  34 year old morbidly obese woman for FU of OSA & asthma She was diagnosed with severe OSA in 2011 when she was pregnant. PSG 07/2010 - weight 425 pounds-total sleep time 123 minutes, AHI 171 per hour. After delivery, she was discharged on CPAP and slept beautifully for a few months. She then started feeling better and felt that OSA resolved and stopped using it. She is now sleepy all the time, Epworth sleepiness score is 18, she lost her job due to dozing at work, she cannot stay awake during a show, she finds it hard to drive, she also reports hallucinations sometimes, which he thinks is due to lack of sleep.   She reports asthma that is well controlled on inhaled steroid. She now has a cold, oxygen saturation is 89% today. She is a nebulizer but does not have medications for this She reports increasing pedal edema over the last few weeks and she has been elevating her legs daily.   12/29/2014  Chief Complaint  Patient presents with  . Follow-up    Patient does not have CPAP, she lost her CPAP after moving.  She needs a new one.  She said that her sleeping has been better since she has been out of the hospital.  Patient was in hospital for 5 days due to stomach infection.    HR was high on walking in- EKG nml Does not take toresemide daily due to legs cramping Asthma symptoms ok - took albuterol twice today - Hr high Denies Cp, dyspnea at baseline Was not able to get cpap -   BMET ok  Review of Systems neg for any significant sore throat, dysphagia, itching, sneezing, nasal congestion or excess/ purulent secretions, fever, chills, sweats, unintended wt loss, pleuritic or exertional cp, hempoptysis, orthopnea pnd or change in chronic leg swelling. Also denies presyncope, palpitations, heartburn, abdominal pain, nausea, vomiting, diarrhea or change in bowel or urinary habits,  dysuria,hematuria, rash, arthralgias, visual complaints, headache, numbness weakness or ataxia.     Objective:   Physical Exam  Gen. Pleasant, obese, in no distress, normal affect ENT - no lesions, no post nasal drip, class 2-3 airway Neck: No JVD, no thyromegaly, no carotid bruits Lungs: no use of accessory muscles, no dullness to percussion, decreased without rales or rhonchi  Cardiovascular: Rhythm regular, heart sounds  normal, no murmurs or gallops, no peripheral edema Abdomen: soft and non-tender, no hepatosplenomegaly, BS normal. Musculoskeletal: No deformities, no cyanosis or clubbing Neuro:  alert, non focal, no tremors       Assessment & Plan:

## 2015-01-02 NOTE — Assessment & Plan Note (Signed)
  CPAP titration study on 4/17 - I will get you a cpap machine after this   Weight loss encouraged, compliance with goal of at least 4-6 hrs every night is the expectation. Advised against medications with sedative side effects Cautioned against driving when sleepy - understanding that sleepiness will vary on a day to day basis

## 2015-01-08 ENCOUNTER — Ambulatory Visit (HOSPITAL_BASED_OUTPATIENT_CLINIC_OR_DEPARTMENT_OTHER): Payer: Medicaid Other | Attending: Pulmonary Disease

## 2015-01-08 VITALS — Ht 63.0 in | Wt >= 6400 oz

## 2015-01-08 DIAGNOSIS — G4733 Obstructive sleep apnea (adult) (pediatric): Secondary | ICD-10-CM | POA: Diagnosis not present

## 2015-01-09 ENCOUNTER — Telehealth: Payer: Self-pay | Admitting: Pulmonary Disease

## 2015-01-09 ENCOUNTER — Ambulatory Visit (HOSPITAL_BASED_OUTPATIENT_CLINIC_OR_DEPARTMENT_OTHER): Payer: Medicaid Other | Admitting: Pulmonary Disease

## 2015-01-09 DIAGNOSIS — G4730 Sleep apnea, unspecified: Secondary | ICD-10-CM | POA: Diagnosis not present

## 2015-01-09 DIAGNOSIS — G4733 Obstructive sleep apnea (adult) (pediatric): Secondary | ICD-10-CM

## 2015-01-09 NOTE — Telephone Encounter (Signed)
Patient notified.  Appointment scheduled. Nothing further needed.

## 2015-01-09 NOTE — Sleep Study (Signed)
[place]  NAME: Dominique [name]  DATE OF BIRTH: July 14, 1981  MEDICAL RECORD NUMBER [id number]  [facility]  PHYSICIAN: Rhian Asebedo V.  DATE OF STUDY: 01/09/2015    SLEEP STUDY TYPE: CPAP titration study               REFERRING PHYSICIAN: Rigoberto Noel, MD  INDICATION FOR STUDY: 34 year old morbidly obese woman with OSA & asthma She was diagnosed with severe OSA in 2011 when she was pregnant. PSG 07/2010 - weight 425 pounds-total sleep time 123 minutes, AHI 171 per hour. After delivery, she was discharged on CPAP and slept beautifully for a few months. She then started feeling better and felt that OSA resolved and stopped using it. At the time of this study ,they weighed 420 pounds with a height of  5 ft 2 inches and the BMI of 77, neck size of 15.5 inches. Epworth sleepiness score was 20   This CPAP titration polysomnogram was performed with a sleep technologist in attendance. EEG, EOG,EMG and respiratory parameters recorded. Sleep stages, arousals, limb movements and respiratory data was scored according to criteria laid out by the American Academy of sleep medicine.  SLEEP ARCHITECTURE: Lights out was at 22-45 PM and lights on was at 536 AM. Total sleep time was 401 minutes with sleep period time of 401 minutes and sleep efficiency of 98% .Sleep latency was 10 minutes with latency to REM sleep of 6 minutes and wake after sleep onset of 1 minutes.  Sleep stages as a percentage of total sleep time was N1 4% N2- 63 % and REM sleep 33 % ( 131 minutes) . The longest period of REM sleep was around 1 AM.   AROUSAL DATA : There were 45 arousals with an arousal index of 7 events per hour. Of these 18 were spontaneous, and 27  were associated with respiratory events and 0 were associated periodic limb movements  RESPIRATORY DATA: CPAP was initiated at 5 centimeters and titrated to a final level of 17 centimeters due to respiratory events and  snoring. At the final level of 17 centimeters, there were 0 obstructive apneas, 0 central apneas, 0 mixed apneas and 2 hypopneas with apnea -hypopnea index of 4.6 events per hour.  Due to persistent snoring and high pressure BiPAP was initiated. A final level of 22/17 for 25 minutes, no events or desaturations were noted. There was no relation to sleep stage or body position. Titration was optimal.  MOVEMENT/PARASOMNIA: There were 0 PLMS with a PLM index of 0 events per hour. The PLM arousal index was 0 events per hour.  OXYGEN DATA: The lowest desaturation was 41 % during non-REM sleep and the desaturation index was 55 per hour. The saturations stayed below 88% for 174 minutes. 2 L oxygen was applied at 2325 and increased to 3 L shortly after due to severe desaturation  CARDIAC DATA: The low heart rate was 36 beats per minute. The high heart rate recorded was an artifact. No arrhythmias were noted   IMPRESSION :  1. Severe obstructive sleep apnea with hypopneas causing sleep fragmentation and mild oxygen desaturation. 2. This was corrected byBiPAP of 22/17 centimeters with a medium fullface mask. Titration was optimal. 3. No evidence of cardiac arrhythmias or behavioral disturbance during sleep. 4. Periodic limb movements were not noted.  RECOMMENDATION:    1. The treatment options for this degree of sleep disordered breathing includes weight loss and/or CPAP therapy. BiPAP can be initiated  at 22 /17 centimeters with a medium fullface mask and compliance monitored at this level. 2. Patient should be cautioned against driving when sleepy 3. They should be asked to avoid medications with sedative side effects  Rigoberto Noel  MD Diplomate, American Board of Sleep Medicine  ELECTRONICALLY SIGNED ON:  01/09/2015  [facility] PH: [phone]   FX: [phone] [place]

## 2015-01-09 NOTE — Telephone Encounter (Signed)
Prescription for auto BiPAP sent to DME  [facility] arrange [facility] visit at [facility]  autobipap , medium fullface mask IPAP 15-22 Epap 10-17 Download in 4 weeks

## 2015-01-16 ENCOUNTER — Telehealth: Payer: Self-pay | Admitting: Pulmonary Disease

## 2015-01-16 NOTE — Telephone Encounter (Signed)
Called spoke with pt. She reports she has not heard from DME. Looks like order was sent to [facility] 01/10/15. I called [facility] [phone]. LMTCB x1 on VM

## 2015-01-17 NOTE — Telephone Encounter (Signed)
Pt returned call  [phone]

## 2015-01-17 NOTE — Telephone Encounter (Signed)
I called made pt aware. Nothing further needed 

## 2015-01-17 NOTE — Telephone Encounter (Signed)
Called and spoke to [facility] and was informed pt has medicare and they just received the CMN for the order and it may take a couple weeks to process. LMTCB for pt.

## 2015-02-17 ENCOUNTER — Other Ambulatory Visit: Payer: Self-pay | Admitting: Family Medicine

## 2015-02-17 NOTE — Telephone Encounter (Signed)
Med filled.  

## 2015-02-23 ENCOUNTER — Ambulatory Visit: Payer: Medicaid Other | Admitting: Pulmonary Disease

## 2015-03-23 ENCOUNTER — Encounter (HOSPITAL_COMMUNITY): Payer: Self-pay

## 2015-03-23 ENCOUNTER — Emergency Department (HOSPITAL_COMMUNITY)
Admission: EM | Admit: 2015-03-23 | Discharge: 2015-03-24 | Disposition: A | Discharge status: Home / Self Care | Payer: Medicaid Other | Attending: Emergency Medicine | Admitting: Emergency Medicine

## 2015-03-23 DIAGNOSIS — Z880 Allergy status to penicillin: Secondary | ICD-10-CM | POA: Insufficient documentation

## 2015-03-23 DIAGNOSIS — K2970 Gastritis, unspecified, without bleeding: Secondary | ICD-10-CM | POA: Insufficient documentation

## 2015-03-23 DIAGNOSIS — Z87891 Personal history of nicotine dependence: Secondary | ICD-10-CM | POA: Insufficient documentation

## 2015-03-23 DIAGNOSIS — R1013 Epigastric pain: Secondary | ICD-10-CM | POA: Diagnosis present

## 2015-03-23 DIAGNOSIS — J45909 Unspecified asthma, uncomplicated: Secondary | ICD-10-CM | POA: Insufficient documentation

## 2015-03-23 DIAGNOSIS — Z7951 Long term (current) use of inhaled steroids: Secondary | ICD-10-CM | POA: Diagnosis not present

## 2015-03-23 DIAGNOSIS — Z3202 Encounter for pregnancy test, result negative: Secondary | ICD-10-CM | POA: Insufficient documentation

## 2015-03-23 DIAGNOSIS — D649 Anemia, unspecified: Secondary | ICD-10-CM | POA: Insufficient documentation

## 2015-03-23 DIAGNOSIS — Z79899 Other long term (current) drug therapy: Secondary | ICD-10-CM | POA: Insufficient documentation

## 2015-03-23 DIAGNOSIS — Z8669 Personal history of other diseases of the nervous system and sense organs: Secondary | ICD-10-CM | POA: Diagnosis not present

## 2015-03-23 LAB — COMPREHENSIVE METABOLIC PANEL
ALK PHOS: 49 U/L (ref 38–126)
ALT: 10 U/L — AB (ref 14–54)
AST: 17 U/L (ref 15–41)
Albumin: 3.8 g/dL (ref 3.5–5.0)
Anion gap: 9 (ref 5–15)
BUN: 9 mg/dL (ref 6–20)
CO2: 24 mmol/L (ref 22–32)
Calcium: 9 mg/dL (ref 8.9–10.3)
Chloride: 105 mmol/L (ref 101–111)
Creatinine, Ser: 0.69 mg/dL (ref 0.44–1.00)
GFR calc non Af Amer: >60 mL/min (ref >60)
Glucose, Bld: 78 mg/dL (ref 65–99)
Potassium: 4.1 mmol/L (ref 3.5–5.1)
SODIUM: 138 mmol/L (ref 135–145)
TOTAL PROTEIN: 7.9 g/dL (ref 6.5–8.1)
Total Bilirubin: 0.5 mg/dL (ref 0.3–1.2)

## 2015-03-23 NOTE — ED Notes (Signed)
Pt presents with c/o upper abdominal pain that started today. Pt reports he has pain in her lower abdomen as well that started a couple of days ago. Pt reports some diarrhea, no nausea. EKG performed in triage as pt is over 25 and pain is above umbilicus.

## 2015-03-24 LAB — URINALYSIS, ROUTINE W REFLEX MICROSCOPIC
Bilirubin Urine: NEGATIVE
Glucose, UA: NEGATIVE mg/dL
HGB URINE DIPSTICK: NEGATIVE
KETONES UR: NEGATIVE mg/dL
Nitrite: NEGATIVE
Protein, ur: NEGATIVE mg/dL
Specific Gravity, Urine: 1.018 (ref 1.005–1.030)
UROBILINOGEN UA: 1 mg/dL (ref 0.0–1.0)
pH: 6 (ref 5.0–8.0)

## 2015-03-24 LAB — CBC WITH DIFFERENTIAL/PLATELET
Basophils Absolute: 0 10*3/uL (ref 0.0–0.1)
Basophils Relative: 0 % (ref 0–1)
EOS PCT: 3 % (ref 0–5)
Eosinophils Absolute: 0.2 10*3/uL (ref 0.0–0.7)
HCT: 30.1 % — ABNORMAL LOW (ref 36.0–46.0)
Hemoglobin: 7.9 g/dL — ABNORMAL LOW (ref 12.0–15.0)
LYMPHS PCT: 19 % (ref 12–46)
Lymphs Abs: 1.4 10*3/uL (ref 0.7–4.0)
MCH: 16.5 pg — ABNORMAL LOW (ref 26.0–34.0)
MCHC: 26.2 g/dL — ABNORMAL LOW (ref 30.0–36.0)
MCV: 62.8 fL — AB (ref 78.0–100.0)
MONO ABS: 0.8 10*3/uL (ref 0.1–1.0)
Monocytes Relative: 10 % (ref 3–12)
NEUTROS PCT: 68 % (ref 43–77)
Neutro Abs: 5.2 10*3/uL (ref 1.7–7.7)
Platelets: 440 10*3/uL — ABNORMAL HIGH (ref 150–400)
RBC: 4.79 MIL/uL (ref 3.87–5.11)
RDW: 20.9 % — AB (ref 11.5–15.5)
WBC: 7.6 10*3/uL (ref 4.0–10.5)

## 2015-03-24 LAB — URINE MICROSCOPIC-ADD ON

## 2015-03-24 LAB — POC URINE PREG, ED: Preg Test, Ur: NEGATIVE

## 2015-03-24 LAB — LIPASE, BLOOD: Lipase: 13 U/L — ABNORMAL LOW (ref 22–51)

## 2015-03-24 MED ORDER — PANTOPRAZOLE SODIUM 40 MG PO TBEC: 40.0000 mg | DELAYED_RELEASE_TABLET | Freq: Every day | ORAL | Status: DC

## 2015-03-24 MED ORDER — PANTOPRAZOLE SODIUM 40 MG PO TBEC
40.0000 mg | DELAYED_RELEASE_TABLET | Freq: Once | ORAL | Status: AC
  Administered 2015-03-24: 40 mg via ORAL
  Filled 2015-03-24: qty 1

## 2015-03-24 NOTE — Discharge Instructions (Signed)

## 2015-03-24 NOTE — ED Provider Notes (Signed)
TIME SEEN: 12:10 AM  CHIEF COMPLAINT: Abdominal pain  HPI: Pt is a 34 y.o. female with history of asthma, morbid obesity who presents to the emergency department with complaints of epigastric pain that has been intermittent. Pain is currently gone. No aggravating or relieving factors. Unable to describe the pain. No radiation. Denies fevers, chills, nausea, vomiting. Has had mild diarrhea without blood or melena. No dysuria or hematuria. No vaginal bleeding or discharge. Has had a history of acute acalculous cholecystitis and was treated with antibiotics. States this does not feel similar.  ROS: See HPI Constitutional: no fever  Eyes: no drainage  ENT: no runny nose   Cardiovascular:  no chest pain  Resp: no SOB  GI: no vomiting GU: no dysuria Integumentary: no rash  Allergy: no hives  Musculoskeletal: no leg swelling  Neurological: no slurred speech ROS otherwise negative  PAST MEDICAL HISTORY/PAST SURGICAL HISTORY:  Past Medical History  Diagnosis Date  . Asthma   . Sleep apnea   . Anemia   . Morbid obesity 03/22/2009    Qualifier: Diagnosis of  By: Ronnald Ramp CMA, Chemira    . OSA (obstructive sleep apnea) 05/02/2014  . Thyromegaly 05/02/2014  . Seasonal allergies 06/13/2014  . Acute acalculous cholecystitis 11/16/2014  . Diverticulitis of colon 11/17/2014  . Hepatic steatosis 11/17/2014    MEDICATIONS:  Prior to Admission medications   Medication Sig Start Date End Date Taking? Authorizing Provider  albuterol (PROVENTIL) (2.5 MG/3ML) 0.083% nebulizer solution Take 3 mLs (2.5 mg total) by nebulization every 6 (six) hours as needed for wheezing or shortness of breath. 10/27/14  Yes Rigoberto Noel, MD  beclomethasone (QVAR) 80 MCG/ACT inhaler Inhale 2 puffs into the lungs 2 (two) times daily. 05/02/14  Yes Midge Minium, MD  ferrous sulfate 325 (65 FE) MG tablet Take 1 tablet (325 mg total) by mouth daily with breakfast. 12/02/14  Yes Midge Minium, MD  PROAIR HFA 108 (90 BASE)  MCG/ACT inhaler INHALE 2 PUFFS BY MOUTH INTO THE LUNGS EVERY 6 HOURS AS NEEDED FOR WHEEZING OR SHORTNESS OF BREATH 02/17/15  Yes Midge Minium, MD  torsemide (DEMADEX) 20 MG tablet Take 1 tablet (20 mg total) by mouth daily. Patient taking differently: Take 20 mg by mouth 3 (three) times a week.  12/01/14  Yes Midge Minium, MD  cyclobenzaprine (FLEXERIL) 10 MG tablet Take 1 tablet (10 mg total) by mouth 3 (three) times daily as needed for muscle spasms. Patient not taking: Reported on 03/23/2015 12/06/14   Midge Minium, MD  naproxen (NAPROSYN) 500 MG tablet Take 1 tablet (500 mg total) by mouth 2 (two) times daily with a meal. Patient not taking: Reported on 03/23/2015 05/02/14 05/02/15  Midge Minium, MD  saccharomyces boulardii (FLORASTOR) 250 MG capsule Take 1 capsule (250 mg total) by mouth 2 (two) times daily. Patient not taking: Reported on 03/23/2015 11/23/14   Robbie Lis, MD    ALLERGIES:  Allergies  Allergen Reactions  . Other Shortness Of Breath and Swelling    Tree nuts  . Penicillins     Unknown childhood allergy    SOCIAL HISTORY:  History  Substance Use Topics  . Smoking status: Former Smoker -- 0.50 packs/day for 4 years    Types: Cigarettes    Start date: 07/24/2014  . Smokeless tobacco: Never Used  . Alcohol Use: No    FAMILY HISTORY: Family History  Problem Relation Age of Onset  . Hypertension Mother   .  Hypertension Father   . Hypertension Sister   . Asthma Brother   . Diabetes Maternal Grandmother   . Hypertension Maternal Grandmother   . Hypertension Maternal Grandfather   . Cancer Paternal Grandmother     lung  . Emphysema Paternal Grandfather   . Allergies Mother     EXAM: BP 148/80 mmHg  Pulse 103  Temp(Src) 98.2 F (36.8 C)  Resp 22  SpO2 94%  LMP 02/22/2015 (Approximate) CONSTITUTIONAL: Alert and oriented and responds appropriately to questions. Well-appearing; well-nourished, obese, no distress, nontoxic, afebrile HEAD:  Normocephalic EYES: Conjunctivae clear, PERRL ENT: normal nose; no rhinorrhea; moist mucous membranes; pharynx without lesions noted NECK: Supple, no meningismus, no LAD  CARD: RRR; S1 and S2 appreciated; no murmurs, no clicks, no rubs, no gallops RESP: Normal chest excursion without splinting or tachypnea; breath sounds clear and equal bilaterally; no wheezes, no rhonchi, no rales, no hypoxia or respiratory distress, speaking full sentences ABD/GI: Normal bowel sounds; non-distended; soft, non-tender, no rebound, no guarding, no peritoneal signs, negative Murphy sign, no tenderness at McBurney's point BACK:  The back appears normal and is non-tender to palpation, there is no CVA tenderness EXT: Normal ROM in all joints; non-tender to palpation; no edema; normal capillary refill; no cyanosis, no calf tenderness or swelling    SKIN: Normal color for age and race; warm NEURO: Moves all extremities equally, sensation to light touch intact diffusely, cranial nerves II through XII intact PSYCH: The patient's mood and manner are appropriate. Grooming and personal hygiene are appropriate.  MEDICAL DECISION MAKING: Patient here with abdominal pain that is now gone. Her abdominal exam is completely benign. Patient's labs are unremarkable including normal white blood cell count and normal LFTs, lipase. Urine pregnancy test negative. Urine shows small leukocytes but a few squamous cells. Suspect dirty catch. I feel she is safe to be discharged home with outpatient follow-up. We'll provide her with Protonix as this may be GERD, gastritis. Discussed return precautions. She verbalized understanding and is comfortable with this plan. Given she is pain-free with benign exam, afebrile and hemodynamically stable I do not feel she needs further emergent workup at this time.       Morris, DO 03/24/15 0800

## 2015-04-07 ENCOUNTER — Other Ambulatory Visit: Payer: Self-pay | Admitting: General Practice

## 2015-04-07 ENCOUNTER — Ambulatory Visit (INDEPENDENT_AMBULATORY_CARE_PROVIDER_SITE_OTHER): Payer: Medicaid Other | Admitting: Family Medicine

## 2015-04-07 ENCOUNTER — Encounter: Payer: Self-pay | Admitting: Family Medicine

## 2015-04-07 ENCOUNTER — Telehealth: Payer: Self-pay | Admitting: *Deleted

## 2015-04-07 VITALS — BP 142/82 | HR 97 | Temp 98.0°F | Resp 16 | Ht 63.0 in | Wt >= 6400 oz

## 2015-04-07 DIAGNOSIS — M545 Low back pain, unspecified: Secondary | ICD-10-CM

## 2015-04-07 DIAGNOSIS — D509 Iron deficiency anemia, unspecified: Secondary | ICD-10-CM

## 2015-04-07 DIAGNOSIS — K2970 Gastritis, unspecified, without bleeding: Secondary | ICD-10-CM

## 2015-04-07 LAB — HEPATIC FUNCTION PANEL
ALBUMIN: 3.4 g/dL — AB (ref 3.5–5.2)
ALK PHOS: 47 U/L (ref 39–117)
ALT: 8 U/L (ref 0–35)
AST: 12 U/L (ref 0–37)
BILIRUBIN DIRECT: 0.2 mg/dL (ref 0.0–0.3)
TOTAL PROTEIN: 6.9 g/dL (ref 6.0–8.3)
Total Bilirubin: 0.7 mg/dL (ref 0.2–1.2)

## 2015-04-07 LAB — BASIC METABOLIC PANEL
BUN: 9 mg/dL (ref 6–23)
CALCIUM: 8.9 mg/dL (ref 8.4–10.5)
CHLORIDE: 105 meq/L (ref 96–112)
CO2: 29 meq/L (ref 19–32)
CREATININE: 0.5 mg/dL (ref 0.40–1.20)
GFR: 180.74 mL/min (ref >60.00)
GLUCOSE: 84 mg/dL (ref 70–99)
Potassium: 4 mEq/L (ref 3.5–5.1)
Sodium: 140 mEq/L (ref 135–145)

## 2015-04-07 LAB — CBC WITH DIFFERENTIAL/PLATELET
Basophils Absolute: 0.1 10*3/uL (ref 0.0–0.1)
Basophils Relative: 1.5 % (ref 0.0–3.0)
EOS PCT: 6.5 % — AB (ref 0.0–5.0)
Eosinophils Absolute: 0.3 10*3/uL (ref 0.0–0.7)
HCT: 26.7 % — ABNORMAL LOW (ref 36.0–46.0)
Hemoglobin: 7.8 g/dL — CL (ref 12.0–15.0)
Lymphocytes Relative: 30 % (ref 12.0–46.0)
Lymphs Abs: 1.5 10*3/uL (ref 0.7–4.0)
Monocytes Absolute: 0.4 10*3/uL (ref 0.1–1.0)
Monocytes Relative: 7.7 % (ref 3.0–12.0)
Neutro Abs: 2.7 10*3/uL (ref 1.4–7.7)
Neutrophils Relative %: 54.3 % (ref 43.0–77.0)
Platelets: 437 10*3/uL — ABNORMAL HIGH (ref 150.0–400.0)
RBC: 4.42 Mil/uL (ref 3.87–5.11)
RDW: 21.8 % — ABNORMAL HIGH (ref 11.5–15.5)
WBC: 5 10*3/uL (ref 4.0–10.5)

## 2015-04-07 LAB — LIPID PANEL
Cholesterol: 144 mg/dL (ref 0–200)
HDL: 42.3 mg/dL (ref >39.00)
LDL CALC: 88 mg/dL (ref 0–99)
NonHDL: 101.7
TRIGLYCERIDES: 71 mg/dL (ref 0.0–149.0)
Total CHOL/HDL Ratio: 3
VLDL: 14.2 mg/dL (ref 0.0–40.0)

## 2015-04-07 LAB — TSH: TSH: 0.75 u[IU]/mL (ref 0.35–4.50)

## 2015-04-07 LAB — HEMOGLOBIN A1C: HEMOGLOBIN A1C: 5.2 % (ref 4.6–6.5)

## 2015-04-07 MED ORDER — CYCLOBENZAPRINE HCL 10 MG PO TABS: 10.0000 mg | ORAL_TABLET | Freq: Every day | ORAL | Status: DC

## 2015-04-07 MED ORDER — FERROUS SULFATE 325 (65 FE) MG PO TABS: 650.0000 mg | ORAL_TABLET | Freq: Every day | ORAL | Status: DC

## 2015-04-07 NOTE — Patient Instructions (Signed)
Schedule your complete physical in 3-4 months We'll notify you of your lab results and make any changes if needed Keep up the good work on healthy diet and regular exercise- you are doing it!!! Continue the protonix for the stomach inflammation Continue the iron daily Use the flexeril at night for spasm and tylenol for pain Call with any questions or concerns Have a great summer!!!

## 2015-04-07 NOTE — Assessment & Plan Note (Signed)
Ongoing problem for pt but she has lost 50 lbs in the last 6 months.  Applauded her efforts and told her how proud I was of her.  Pt very pleased w/ the praise and intends to use this as motivation.  Check labs to risk stratify.  Will continue to follow.

## 2015-04-07 NOTE — Progress Notes (Signed)
Pre visit review using our clinic review tool, if applicable. No additional management support is needed unless otherwise documented below in the visit note. 

## 2015-04-07 NOTE — Progress Notes (Signed)
   Subjective:    Patient ID: Dominique [name], female    DOB: 07-19-1981, 34 y.o.   MRN: [id number]  HPI Anemia- pt has hx of chronic anemia.  Taking iron daily.  Pt reports energy level is improving.  Less SOB.  Gastritis- pt went to ER on 6/30 due to abdominal pain. dx'd w/ gastritis and started on Protonix daily.  sxs are improving.  Denies N/V, melena.  Obesity- chronic problem, pt has lost 50 lbs since December.  Walking more regularly.  Trying to make better food choices- has stopped drinking sodas and cut out her sweets and snacks.  LBP- recurrent problem for pt.  Pt reports pain is worse when pt wakes or w/ prolonged sitting.  Improved w/ tylenol.   Review of Systems For ROS see HPI     Objective:   Physical Exam  Constitutional: She is oriented to person, place, and time. She appears well-developed and well-nourished. No distress.  obese  HENT:  Head: Normocephalic and atraumatic.  Eyes: Conjunctivae and EOM are normal. Pupils are equal, round, and reactive to light.  Neck: Normal range of motion. Neck supple. No thyromegaly present.  Cardiovascular: Normal rate, regular rhythm, normal heart sounds and intact distal pulses.   No murmur heard. Pulmonary/Chest: Effort normal and breath sounds normal. No respiratory distress.  Abdominal: Soft. She exhibits no distension. There is no tenderness.  Musculoskeletal: She exhibits no edema.  Lymphadenopathy:    She has no cervical adenopathy.  Neurological: She is alert and oriented to person, place, and time.  Skin: Skin is warm and dry.  Psychiatric: She has a normal mood and affect. Her behavior is normal.  Vitals reviewed.         Assessment & Plan:

## 2015-04-07 NOTE — Assessment & Plan Note (Signed)
Recurrent problem for pt- most likely due to her obesity.  Tylenol and flexeril prn.  No NSAIDs due to gastritis.  Applauded her recent efforts at walking- pt has lost 50 lbs since December!  Reviewed supportive care and red flags that should prompt return.  Pt expressed understanding and is in agreement w/ plan.

## 2015-04-07 NOTE — Assessment & Plan Note (Signed)
New to provider.  Improved since starting Protonix daily.  Will continue at this time.  No changes.

## 2015-04-07 NOTE — Telephone Encounter (Signed)
Santiago Glad from [facility] called that patient's Hgb is 7.8.

## 2015-04-07 NOTE — Assessment & Plan Note (Signed)
Ongoing issue for pt.  Taking her daily iron supplement.  Denies SOB, energy level is improving.  Check CBC.  Adjust tx prn.

## 2015-04-25 ENCOUNTER — Ambulatory Visit (INDEPENDENT_AMBULATORY_CARE_PROVIDER_SITE_OTHER): Payer: Self-pay | Admitting: Medical

## 2015-04-25 ENCOUNTER — Ambulatory Visit (HOSPITAL_BASED_OUTPATIENT_CLINIC_OR_DEPARTMENT_OTHER)
Admission: RE | Admit: 2015-04-25 | Discharge: 2015-04-25 | Disposition: A | Discharge status: Home / Self Care | Payer: Medicaid Other | Source: Ambulatory Visit | Attending: Medical | Admitting: Medical

## 2015-04-25 ENCOUNTER — Encounter: Payer: Self-pay | Admitting: Medical

## 2015-04-25 VITALS — BP 153/82 | HR 106 | Temp 98.8°F | Ht 63.0 in | Wt >= 6400 oz

## 2015-04-25 DIAGNOSIS — R1011 Right upper quadrant pain: Secondary | ICD-10-CM | POA: Diagnosis present

## 2015-04-25 DIAGNOSIS — R1010 Upper abdominal pain, unspecified: Secondary | ICD-10-CM

## 2015-04-25 DIAGNOSIS — R197 Diarrhea, unspecified: Secondary | ICD-10-CM

## 2015-04-25 LAB — CBC WITH DIFFERENTIAL/PLATELET
BASOS PCT: 0.8 % (ref 0.0–3.0)
Basophils Absolute: 0 10*3/uL (ref 0.0–0.1)
EOS ABS: 0.4 10*3/uL (ref 0.0–0.7)
EOS PCT: 6.6 % — AB (ref 0.0–5.0)
HEMATOCRIT: 27.6 % — AB (ref 36.0–46.0)
LYMPHS PCT: 23.1 % (ref 12.0–46.0)
Lymphs Abs: 1.2 10*3/uL (ref 0.7–4.0)
MCHC: 29.3 g/dL — ABNORMAL LOW (ref 30.0–36.0)
MONO ABS: 0.4 10*3/uL (ref 0.1–1.0)
MONOS PCT: 7.9 % (ref 3.0–12.0)
Neutro Abs: 3.3 10*3/uL (ref 1.4–7.7)
Neutrophils Relative %: 61.6 % (ref 43.0–77.0)
PLATELETS: 399 10*3/uL (ref 150.0–400.0)
RBC: 4.5 Mil/uL (ref 3.87–5.11)
RDW: 23 % — ABNORMAL HIGH (ref 11.5–15.5)
WBC: 5.4 10*3/uL (ref 4.0–10.5)

## 2015-04-25 LAB — COMPREHENSIVE METABOLIC PANEL
ALK PHOS: 48 U/L (ref 39–117)
ALT: 12 U/L (ref 0–35)
AST: 16 U/L (ref 0–37)
Albumin: 3.5 g/dL (ref 3.5–5.2)
BILIRUBIN TOTAL: 0.5 mg/dL (ref 0.2–1.2)
BUN: 5 mg/dL — ABNORMAL LOW (ref 6–23)
CALCIUM: 9 mg/dL (ref 8.4–10.5)
CHLORIDE: 105 meq/L (ref 96–112)
CO2: 29 mEq/L (ref 19–32)
CREATININE: 0.53 mg/dL (ref 0.40–1.20)
GFR: 168.94 mL/min (ref >60.00)
Glucose, Bld: 74 mg/dL (ref 70–99)
POTASSIUM: 3.6 meq/L (ref 3.5–5.1)
Sodium: 139 mEq/L (ref 135–145)
Total Protein: 6.9 g/dL (ref 6.0–8.3)

## 2015-04-25 LAB — LIPASE: Lipase: 9 U/L — ABNORMAL LOW (ref 11.0–59.0)

## 2015-04-25 LAB — AMYLASE: AMYLASE: 17 U/L — AB (ref 27–131)

## 2015-04-25 MED ORDER — RANITIDINE HCL 150 MG PO CAPS: 150.0000 mg | ORAL_CAPSULE | Freq: Two times a day (BID) | ORAL | Status: DC

## 2015-04-25 NOTE — Progress Notes (Signed)
Pre visit review using our clinic review tool, if applicable. No additional management support is needed unless otherwise documented below in the visit note. 

## 2015-04-25 NOTE — Progress Notes (Signed)
Subjective:    Patient ID: Dominique [name], female    DOB: 03/12/1981, 34 y.o.   MRN: [id number]  HPI Pt in states since January she has had some abdominal pain and went to ED. No cause found on that visit(on and off since that visit). In February had pain again. She went to ED. That time she was dx in February with acalculous cholecystitis. Pt states that time she was admitted and place on antibiotics. Also note in chart states pt ahd bout of diverticulitis in march.  Pt now in states since end of May gets some daily abdominal pain. Pain all day. Get full faster. She will sometimes vomit after eating. Pt states. Pain in abdomen is usually in ruq and some mid suprapubc. No dysuria. No uti symptoms.  LMP- July 4th.    Review of Systems  Constitutional: Negative for fever, chills, diaphoresis and fatigue.  Respiratory: Negative for cough, chest tightness, shortness of breath and wheezing.   Cardiovascular: Negative for chest pain and palpitations.  Gastrointestinal: Positive for vomiting, abdominal pain and diarrhea. Negative for constipation.       Occasional vomiting if eats a lot. Often daily diarrhea for months.  Musculoskeletal: Negative for back pain.  Neurological: Negative for dizziness, weakness, light-headedness, numbness and headaches.  Hematological: Negative for adenopathy. Does not bruise/bleed easily.  Psychiatric/Behavioral: Negative for behavioral problems, confusion and agitation.    Past Medical History  Diagnosis Date  . Asthma   . Sleep apnea   . Anemia   . Morbid obesity 03/22/2009    Qualifier: Diagnosis of  By: Ronnald Ramp CMA, Chemira    . OSA (obstructive sleep apnea) 05/02/2014  . Thyromegaly 05/02/2014  . Seasonal allergies 06/13/2014  . Acute acalculous cholecystitis 11/16/2014  . Diverticulitis of colon 11/17/2014  . Hepatic steatosis 11/17/2014    History   Social History  . Marital Status: Single    Spouse Name: N/A  . Number of Children: N/A  . Years of  Education: N/A   Occupational History  . Not on file.   Social History Main Topics  . Smoking status: Former Smoker -- 0.50 packs/day for 4 years    Types: Cigarettes    Start date: 07/24/2014  . Smokeless tobacco: Never Used  . Alcohol Use: No  . Drug Use: No  . Sexual Activity: Not on file   Other Topics Concern  . Not on file   Social History Narrative    Past Surgical History  Procedure Laterality Date  . C section 2011  2011    Family History  Problem Relation Age of Onset  . Hypertension Mother   . Hypertension Father   . Hypertension Sister   . Asthma Brother   . Diabetes Maternal Grandmother   . Hypertension Maternal Grandmother   . Hypertension Maternal Grandfather   . Cancer Paternal Grandmother     lung  . Emphysema Paternal Grandfather   . Allergies Mother     Allergies  Allergen Reactions  . Other Shortness Of Breath and Swelling    Tree nuts  . Penicillins     Unknown childhood allergy    Current Outpatient Prescriptions on File Prior to Visit  Medication Sig Dispense Refill  . albuterol (PROVENTIL) (2.5 MG/3ML) 0.083% nebulizer solution Take 3 mLs (2.5 mg total) by nebulization every 6 (six) hours as needed for wheezing or shortness of breath. 360 mL 0  . beclomethasone (QVAR) 80 MCG/ACT inhaler Inhale 2 puffs into the lungs 2 (two) times  daily. 1 Inhaler 12  . cyclobenzaprine (FLEXERIL) 10 MG tablet Take 1 tablet (10 mg total) by mouth at bedtime. 30 tablet 1  . ferrous sulfate 325 (65 FE) MG tablet Take 2 tablets (650 mg total) by mouth daily with breakfast. 60 tablet 3  . pantoprazole (PROTONIX) 40 MG tablet Take 1 tablet (40 mg total) by mouth daily. 30 tablet 1  . PROAIR HFA 108 (90 BASE) MCG/ACT inhaler INHALE 2 PUFFS BY MOUTH INTO THE LUNGS EVERY 6 HOURS AS NEEDED FOR WHEEZING OR SHORTNESS OF BREATH 8.5 g 3  . torsemide (DEMADEX) 20 MG tablet Take 1 tablet (20 mg total) by mouth daily. (Patient taking differently: Take 20 mg by mouth 3  (three) times a week. ) 30 tablet 3   No current facility-administered medications on file prior to visit.    BP 153/82 mmHg  Pulse 106  Temp(Src) 98.8 F (37.1 C) (Oral)  Ht 5' 3"  (1.6 m)  Wt 410 lb (185.975 kg)  BMI 72.65 kg/m2  SpO2 95%  LMP 03/27/2015       Objective:   Physical Exam  General Appearance- Not in acute distress.  HEENT Eyes- Scleraeral/Conjuntiva-bilat- Not Yellow. Mouth & Throat- Normal.  Chest and Lung Exam Auscultation: Breath sounds:-Normal. Adventitious sounds:- No Adventitious sounds.  Cardiovascular Auscultation:Rythm - Regular. Heart Sounds -Normal heart sounds.  Abdomen Inspection:-Inspection Normal.  Palpation/Perucssion: Palpation and Percussion of the abdomen reveal- moderate Rt upper quadrant  Tender, No Rebound tenderness, No rigidity(Guarding) and No Palpable abdominal masses.  Liver:-Normal.  Spleen:- Normal.   Back- no cva tenderness.      Assessment & Plan:  For your abdomen pain will rx ranitidine and continue protonix. Will get cbc, cmp, amylase and lipase today stat. Get stool panel studies.   Refer to GI since pt had various symptoms since January at least  If GB + then refer to surgeon.  Follow up in 5 days or as needed

## 2015-04-25 NOTE — Patient Instructions (Addendum)
For your abdomen pain will rx ranitidine and continue protonix. Will get cbc, cmp, amylase and lipase today stat. Get stool panel studies.   Refer to GI since pt had various symptoms since January at least. Pt informed today that we could not make referal to GI since we are not on her insurance card. She states she will make the referal herself.  If GB + then refer to surgeon.  Follow up in 5 days or as needed

## 2015-04-26 ENCOUNTER — Telehealth: Payer: Self-pay | Admitting: Medical

## 2015-04-26 DIAGNOSIS — D6489 Other specified anemias: Secondary | ICD-10-CM

## 2015-04-26 NOTE — Telephone Encounter (Signed)
Future labs placed. Stool cards as well.

## 2015-04-27 ENCOUNTER — Telehealth: Payer: Self-pay | Admitting: Family Medicine

## 2015-04-27 NOTE — Telephone Encounter (Signed)
Spoke with patient.

## 2015-04-27 NOTE — Telephone Encounter (Signed)
Relation to pt: self  Call back number:[phone]   Reason for call:   Patient requesting lab results 04/25/2015

## 2015-04-28 ENCOUNTER — Ambulatory Visit: Payer: Medicaid Other | Admitting: Medical

## 2015-05-20 ENCOUNTER — Emergency Department (HOSPITAL_COMMUNITY)
Admission: EM | Admit: 2015-05-20 | Discharge: 2015-05-20 | Disposition: A | Discharge status: Home / Self Care | Payer: Medicaid Other | Attending: Emergency Medicine | Admitting: Emergency Medicine

## 2015-05-20 ENCOUNTER — Emergency Department (HOSPITAL_COMMUNITY): Payer: Medicaid Other

## 2015-05-20 ENCOUNTER — Encounter (HOSPITAL_COMMUNITY): Payer: Self-pay | Admitting: Emergency Medicine

## 2015-05-20 DIAGNOSIS — E876 Hypokalemia: Secondary | ICD-10-CM | POA: Diagnosis not present

## 2015-05-20 DIAGNOSIS — Z880 Allergy status to penicillin: Secondary | ICD-10-CM | POA: Insufficient documentation

## 2015-05-20 DIAGNOSIS — R109 Unspecified abdominal pain: Secondary | ICD-10-CM

## 2015-05-20 DIAGNOSIS — Z3202 Encounter for pregnancy test, result negative: Secondary | ICD-10-CM | POA: Insufficient documentation

## 2015-05-20 DIAGNOSIS — Z87891 Personal history of nicotine dependence: Secondary | ICD-10-CM | POA: Insufficient documentation

## 2015-05-20 DIAGNOSIS — J45909 Unspecified asthma, uncomplicated: Secondary | ICD-10-CM | POA: Insufficient documentation

## 2015-05-20 DIAGNOSIS — K529 Noninfective gastroenteritis and colitis, unspecified: Secondary | ICD-10-CM | POA: Diagnosis not present

## 2015-05-20 DIAGNOSIS — D649 Anemia, unspecified: Secondary | ICD-10-CM | POA: Insufficient documentation

## 2015-05-20 DIAGNOSIS — Z8669 Personal history of other diseases of the nervous system and sense organs: Secondary | ICD-10-CM | POA: Insufficient documentation

## 2015-05-20 DIAGNOSIS — Z79899 Other long term (current) drug therapy: Secondary | ICD-10-CM | POA: Diagnosis not present

## 2015-05-20 DIAGNOSIS — Z7951 Long term (current) use of inhaled steroids: Secondary | ICD-10-CM | POA: Diagnosis not present

## 2015-05-20 DIAGNOSIS — R000 Tachycardia, unspecified: Secondary | ICD-10-CM | POA: Insufficient documentation

## 2015-05-20 DIAGNOSIS — R112 Nausea with vomiting, unspecified: Secondary | ICD-10-CM

## 2015-05-20 DIAGNOSIS — R1012 Left upper quadrant pain: Secondary | ICD-10-CM | POA: Diagnosis present

## 2015-05-20 DIAGNOSIS — G8929 Other chronic pain: Secondary | ICD-10-CM | POA: Insufficient documentation

## 2015-05-20 LAB — COMPREHENSIVE METABOLIC PANEL
ALK PHOS: 73 U/L (ref 38–126)
ALT: 18 U/L (ref 14–54)
AST: 21 U/L (ref 15–41)
Albumin: 3.3 g/dL — ABNORMAL LOW (ref 3.5–5.0)
Anion gap: 8 (ref 5–15)
BUN: 8 mg/dL (ref 6–20)
CALCIUM: 8.7 mg/dL — AB (ref 8.9–10.3)
CHLORIDE: 104 mmol/L (ref 101–111)
CO2: 26 mmol/L (ref 22–32)
CREATININE: 0.55 mg/dL (ref 0.44–1.00)
GFR calc non Af Amer: >60 mL/min (ref >60)
GLUCOSE: 88 mg/dL (ref 65–99)
Potassium: 3.3 mmol/L — ABNORMAL LOW (ref 3.5–5.1)
SODIUM: 138 mmol/L (ref 135–145)
Total Bilirubin: 1 mg/dL (ref 0.3–1.2)
Total Protein: 7.4 g/dL (ref 6.5–8.1)

## 2015-05-20 LAB — CBC
HCT: 34.3 % — ABNORMAL LOW (ref 36.0–46.0)
Hemoglobin: 9.6 g/dL — ABNORMAL LOW (ref 12.0–15.0)
MCH: 17.8 pg — AB (ref 26.0–34.0)
MCHC: 28 g/dL — AB (ref 30.0–36.0)
MCV: 63.5 fL — AB (ref 78.0–100.0)
PLATELETS: 486 10*3/uL — AB (ref 150–400)
RBC: 5.4 MIL/uL — AB (ref 3.87–5.11)
RDW: 20.5 % — AB (ref 11.5–15.5)
WBC: 8.3 10*3/uL (ref 4.0–10.5)

## 2015-05-20 LAB — URINALYSIS, ROUTINE W REFLEX MICROSCOPIC
GLUCOSE, UA: NEGATIVE mg/dL
HGB URINE DIPSTICK: NEGATIVE
KETONES UR: 15 mg/dL — AB
Nitrite: NEGATIVE
PH: 5.5 (ref 5.0–8.0)
PROTEIN: NEGATIVE mg/dL
Specific Gravity, Urine: 1.022 (ref 1.005–1.030)
Urobilinogen, UA: 1 mg/dL (ref 0.0–1.0)

## 2015-05-20 LAB — URINE MICROSCOPIC-ADD ON

## 2015-05-20 LAB — I-STAT BETA HCG BLOOD, ED (MC, WL, AP ONLY): I-stat hCG, quantitative: <5 m[IU]/mL (ref <5)

## 2015-05-20 LAB — LIPASE, BLOOD: Lipase: <10 U/L — ABNORMAL LOW (ref 22–51)

## 2015-05-20 MED ORDER — IOHEXOL 300 MG/ML  SOLN
100.0000 mL | Freq: Once | INTRAMUSCULAR | Status: AC | PRN
  Administered 2015-05-20: 100 mL via INTRAVENOUS

## 2015-05-20 MED ORDER — SODIUM CHLORIDE 0.9 % IV BOLUS (SEPSIS)
1000.0000 mL | Freq: Once | INTRAVENOUS | Status: AC
  Administered 2015-05-20: 1000 mL via INTRAVENOUS

## 2015-05-20 MED ORDER — METRONIDAZOLE 500 MG PO TABS: 500.0000 mg | ORAL_TABLET | Freq: Three times a day (TID) | ORAL | Status: DC

## 2015-05-20 MED ORDER — MORPHINE SULFATE (PF) 4 MG/ML IV SOLN
4.0000 mg | Freq: Once | INTRAVENOUS | Status: AC
  Administered 2015-05-20: 4 mg via INTRAVENOUS
  Filled 2015-05-20: qty 1

## 2015-05-20 MED ORDER — IOHEXOL 300 MG/ML  SOLN
50.0000 mL | Freq: Once | INTRAMUSCULAR | Status: AC | PRN
  Administered 2015-05-20: 50 mL via ORAL

## 2015-05-20 MED ORDER — CIPROFLOXACIN HCL 500 MG PO TABS
500.0000 mg | ORAL_TABLET | Freq: Two times a day (BID) | ORAL | Status: DC
Start: 2015-05-20 — End: 2015-06-04

## 2015-05-20 MED ORDER — ONDANSETRON HCL 8 MG PO TABS: 8.0000 mg | ORAL_TABLET | Freq: Three times a day (TID) | ORAL | Status: DC | PRN

## 2015-05-20 NOTE — ED Provider Notes (Signed)
CSN: [id number]     Arrival date & time 05/20/15  1445 History   First MD Initiated Contact with Patient 05/20/15 1635     Chief Complaint  Patient presents with  . Abdominal Pain  . Diarrhea  . Emesis     (Consider location/radiation/quality/duration/timing/severity/associated sxs/prior Treatment) HPI Comments: Dominique [name] is a 34 y.o. female with a PMHx of morbid obesity, anemia, asthma, OSA, thyromegaly, chronic sinus tachycardia, diverticulosis, and hepatic steatosis, who presents to the ED with complaints of left upper quadrant pain that began gradually 5 days prior to arrival. She reports she has had ongoing generalized abdominal pain for several months, as well as intermittent nausea and vomiting and diarrhea several months, unchanged at this time, and she is scheduled to see the GI doctor in September. On Monday or Tuesday of this week she developed gradual onset 7/10 constant pulling pain in the left lateral upper abdomen, nonradiating, worse with walking, and improved with her home Norco. She states that this pain is different than her chronic abdominal pain. She reports she is not currently nauseated, and her last episode of vomiting was on Tuesday. Her diarrhea, which is chronic and unchanged, she describes as nonbloody watery stool, reports having 3-4 episodes daily. She denies any fevers, chills, chest pain, shortness breath, hematemesis, melena, hematochezia, constipation, obstipation, dysuria, hematuria, flank pain, vaginal bleeding or discharge, numbness, tingling, weakness, back pain, recent travel, sick contacts, suspicious food intake, antibiotic use, alcohol use, NSAIDs, or any heavy lifting recently. Last menstrual period was 2 weeks ago, she is not currently sexually active.  Patient is a 34 y.o. female presenting with abdominal pain, diarrhea, and vomiting. The history is provided by the patient. No language interpreter was used.  Abdominal Pain Pain location:  LUQ Pain  quality: tugging   Pain radiates to:  Does not radiate Pain severity:  Moderate Onset quality:  Gradual Duration:  5 days Timing:  Constant Progression:  Unchanged Chronicity:  New Context: not recent travel, not sick contacts and not suspicious food intake   Relieved by: home norco. Worsened by:  Movement (walking) Ineffective treatments:  None tried Associated symptoms: diarrhea (chronic, unchanged) and vomiting (intermittent, chronic and unchanged, last episode on Tuesday)   Associated symptoms: no chest pain, no chills, no constipation, no dysuria, no fever, no flatus, no hematemesis, no hematochezia, no hematuria, no melena, no nausea, no shortness of breath, no vaginal bleeding and no vaginal discharge   Risk factors: obesity   Diarrhea Associated symptoms: abdominal pain and vomiting (intermittent, chronic and unchanged, last episode on Tuesday)   Associated symptoms: no arthralgias, no chills, no fever and no myalgias   Emesis Associated symptoms: abdominal pain and diarrhea (chronic, unchanged)   Associated symptoms: no arthralgias, no chills and no myalgias     Past Medical History  Diagnosis Date  . Asthma   . Sleep apnea   . Anemia   . Morbid obesity 03/22/2009    Qualifier: Diagnosis of  By: Ronnald Ramp CMA, Chemira    . OSA (obstructive sleep apnea) 05/02/2014  . Thyromegaly 05/02/2014  . Seasonal allergies 06/13/2014  . Acute acalculous cholecystitis 11/16/2014  . Diverticulitis of colon 11/17/2014  . Hepatic steatosis 11/17/2014   Past Surgical History  Procedure Laterality Date  . C section 2011  2011   Family History  Problem Relation Age of Onset  . Hypertension Mother   . Hypertension Father   . Hypertension Sister   . Asthma Brother   . Diabetes Maternal Grandmother   .  Hypertension Maternal Grandmother   . Hypertension Maternal Grandfather   . Cancer Paternal Grandmother     lung  . Emphysema Paternal Grandfather   . Allergies Mother    Social History    Substance Use Topics  . Smoking status: Former Smoker -- 0.50 packs/day for 4 years    Types: Cigarettes    Start date: 07/24/2014  . Smokeless tobacco: Never Used  . Alcohol Use: No   OB History    No data available     Review of Systems  Constitutional: Negative for fever and chills.  Respiratory: Negative for shortness of breath.   Cardiovascular: Negative for chest pain.  Gastrointestinal: Positive for vomiting (intermittent, chronic and unchanged, last episode on Tuesday), abdominal pain and diarrhea (chronic, unchanged). Negative for nausea, constipation, melena, hematochezia, flatus and hematemesis.  Genitourinary: Negative for dysuria, hematuria, flank pain, vaginal bleeding and vaginal discharge.  Musculoskeletal: Negative for myalgias, back pain and arthralgias.  Skin: Negative for color change.  Allergic/Immunologic: Negative for immunocompromised state.  Neurological: Negative for weakness and numbness.  Psychiatric/Behavioral: Negative for confusion.   10 Systems reviewed and are negative for acute change except as noted in the HPI.    Allergies  Other and Penicillins  Home Medications   Prior to Admission medications   Medication Sig Start Date End Date Taking? Authorizing Provider  albuterol (PROVENTIL) (2.5 MG/3ML) 0.083% nebulizer solution Take 3 mLs (2.5 mg total) by nebulization every 6 (six) hours as needed for wheezing or shortness of breath. 10/27/14   Rigoberto Noel, MD  beclomethasone (QVAR) 80 MCG/ACT inhaler Inhale 2 puffs into the lungs 2 (two) times daily. 05/02/14   Midge Minium, MD  cyclobenzaprine (FLEXERIL) 10 MG tablet Take 1 tablet (10 mg total) by mouth at bedtime. 04/07/15   Midge Minium, MD  ferrous sulfate 325 (65 FE) MG tablet Take 2 tablets (650 mg total) by mouth daily with breakfast. 04/07/15   Midge Minium, MD  pantoprazole (PROTONIX) 40 MG tablet Take 1 tablet (40 mg total) by mouth daily. 03/24/15   Kristen N Ward, DO   PROAIR HFA 108 (90 BASE) MCG/ACT inhaler INHALE 2 PUFFS BY MOUTH INTO THE LUNGS EVERY 6 HOURS AS NEEDED FOR WHEEZING OR SHORTNESS OF BREATH 02/17/15   Midge Minium, MD  ranitidine (ZANTAC) 150 MG capsule Take 1 capsule (150 mg total) by mouth 2 (two) times daily. 04/25/15   Percell Miller Saguier, PA-C  torsemide (DEMADEX) 20 MG tablet Take 1 tablet (20 mg total) by mouth daily. Patient taking differently: Take 20 mg by mouth 3 (three) times a week.  12/01/14   Midge Minium, MD   BP 127/73 mmHg  Pulse 101  Temp(Src) 98.3 F (36.8 C) (Oral)  Resp 17  SpO2 95%  LMP 03/27/2015 Physical Exam  Constitutional: She is oriented to person, place, and time. Vital signs are normal. She appears well-developed and well-nourished.  Non-toxic appearance. No distress.  Afebrile, nontoxic, NAD. Morbidly obese  HENT:  Head: Normocephalic and atraumatic.  Mouth/Throat: Oropharynx is clear and moist and mucous membranes are normal.  Eyes: Conjunctivae and EOM are normal. Right eye exhibits no discharge. Left eye exhibits no discharge.  Neck: Normal range of motion. Neck supple.  Cardiovascular: Normal rate, regular rhythm, normal heart sounds and intact distal pulses.  Exam reveals no gallop and no friction rub.   No murmur heard. Mild tachycardia in triage which resolved on exam  Pulmonary/Chest: Effort normal and breath sounds normal. No  respiratory distress. She has no decreased breath sounds. She has no wheezes. She has no rhonchi. She has no rales.  Abdominal: Soft. Normal appearance and bowel sounds are normal. She exhibits no distension. There is tenderness in the left upper quadrant. There is no rigidity, no rebound, no guarding, no CVA tenderness, no tenderness at McBurney's point and negative Murphy's sign.    Morbid obesity limits exam but overall abd is soft, nondistended, +BS throughout, with LUQ/L lateral abdomen TTP, no r/g/r, neg murphy's, neg mcburney's, no CVA TTP   Musculoskeletal:  Normal range of motion.  Neurological: She is alert and oriented to person, place, and time. She has normal strength. No sensory deficit.  Skin: Skin is warm, dry and intact. No rash noted.  Psychiatric: She has a normal mood and affect.  Nursing note and vitals reviewed.   ED Course  Procedures (including critical care time) Labs Review Labs Reviewed  CBC - Abnormal; Notable for the following:    RBC 5.40 (*)    Hemoglobin 9.6 (*)    HCT 34.3 (*)    MCV 63.5 (*)    MCH 17.8 (*)    MCHC 28.0 (*)    RDW 20.5 (*)    Platelets 486 (*)    All other components within normal limits  LIPASE, BLOOD  COMPREHENSIVE METABOLIC PANEL  URINALYSIS, ROUTINE W REFLEX MICROSCOPIC (NOT AT [facility])    Imaging Review Ct Abdomen Pelvis W Contrast  05/20/2015   CLINICAL DATA:  Left lower quadrant abdominal pain. History of diverticulitis.  EXAM: CT ABDOMEN AND PELVIS WITH CONTRAST  TECHNIQUE: Multidetector CT imaging of the abdomen and pelvis was performed using the standard protocol following bolus administration of intravenous contrast.  CONTRAST:  119m OMNIPAQUE IOHEXOL 300 MG/ML SOLN, 536mOMNIPAQUE IOHEXOL 300 MG/ML SOLN  COMPARISON:  None.  FINDINGS: Lung bases are clear. No effusions. Heart is normal size.  Mild diffuse fatty infiltration of the liver. No focal abnormality. Gallbladder, spleen, pancreas, adrenals and kidneys are unremarkable.  There is marked wall thickening and surrounding inflammatory changes around the entire descending colon and proximal sigmoid colon. Findings most compatible with colitis. No fluid collection. No free air. Small bowel and stomach are unremarkable. Moderate stool burden throughout the colon.  Uterus, adnexae and urinary bladder are unremarkable. No free fluid, free air or adenopathy. There are mildly prominent pericolonic lymph nodes adjacent to the rectum and sigmoid colon, presumably reactive. Aorta is normal caliber.  No acute bony abnormality or focal bone lesion.   IMPRESSION: Marked colonic wall thickening and surrounding inflammatory change involving the entire descending colon and proximal sigmoid colon compatible with colitis.  Moderate stool burden throughout the colon.  Fatty liver.   Electronically Signed   By: KeRolm Baptise.D.   On: 05/20/2015 19:07   I have personally reviewed and evaluated these images and lab results as part of my medical decision-making.   EKG Interpretation None      MDM   Final diagnoses:  Chronic abdominal pain  LUQ abdominal pain  Non-intractable vomiting with nausea, vomiting of unspecified type  Chronic diarrhea  Colitis  Anemia, unspecified anemia type  Hypokalemia    [age].o. female here with LUQ/L lateral abdomen pain that is new beginning 5 days ago. Also has chronic unchanged generalized abd pain and chronic intermittent n/v, with daily ongoing chronic unchanged diarrhea, scheduled to see GI in September. On exam, morbid obesity limits exam, but pt has moderate tenderness in LUQ. Nonperitoneal. No CVA  tenderness. No urinary or vaginal complaints, doubt need for pelvic exam. U/A contaminated without signs of hematuria or UTI. Lipase WNL. CMP WNL aside from mildly low K, doubt need for repletion. CBC with chronic anemia, improved from baseline. Will obtain CT to eval for diverticulitis vs other etiology of pain. Will reassess shortly.   7:22 PM Pain improving. CT showing colitis with moderate stool burden. Will tx with cipro/flagyl. Will send home with zofran. Pt has home pain meds. Pt concerned with affording her antibiotics, will consult case management. Cipro $4 and Flagyl $10 at [facility]. Case management stating that this is as good as we can do. Pt states this is fine. Discussed f/up with PCP in 1wk, and with GI specialist at her appt in [place]. I explained the diagnosis and have given explicit precautions to return to the ER including for any other new or worsening symptoms. The patient understands and accepts  the medical plan as it's been dictated and I have answered their questions. Discharge instructions concerning home care and prescriptions have been given. The patient is STABLE and is discharged to home in good condition.  BP 127/73 mmHg  Pulse 101  Temp(Src) 98.3 F (36.8 C) (Oral)  Resp 17  SpO2 95%  LMP 05/06/2015  Meds ordered this encounter  Medications  . morphine 4 MG/ML injection 4 mg    Sig:   . sodium chloride 0.9 % bolus 1,000 mL    Sig:   . iohexol (OMNIPAQUE) 300 MG/ML solution 50 mL    Sig:   . iohexol (OMNIPAQUE) 300 MG/ML solution 100 mL    Sig:   . ciprofloxacin (CIPRO) 500 MG tablet    Sig: Take 1 tablet (500 mg total) by mouth 2 (two) times daily. One po bid x 7 days    Dispense:  14 tablet    Refill:  0    Order Specific Question:  Supervising Provider    Answer:  Sabra Heck, BRIAN [[id number]]  . metroNIDAZOLE (FLAGYL) 500 MG tablet    Sig: Take 1 tablet (500 mg total) by mouth 3 (three) times daily. One po tid x 7 days    Dispense:  21 tablet    Refill:  0    Order Specific Question:  Supervising Provider    Answer:  Sabra Heck, BRIAN [[id number]]  . ondansetron (ZOFRAN) 8 MG tablet    Sig: Take 1 tablet (8 mg total) by mouth every 8 (eight) hours as needed for nausea or vomiting.    Dispense:  10 tablet    Refill:  0    Order Specific Question:  Supervising Provider    Answer:  Noemi Chapel [[id number]]     Michai Dieppa Camprubi-Soms, PA-C 05/20/15 1928  Noemi Chapel, MD 05/21/15 [phone]

## 2015-05-20 NOTE — ED Notes (Signed)
Pt states that she has had abd pain for while and schedule to see Gi doctor in September, pt states that since earlier this week been having LLQ pain with intermittent n/v/d.  Pt states that she has v/d couple times today.

## 2015-05-20 NOTE — Discharge Instructions (Signed)
Abdominal (belly) pain can be caused by many things. Your caregiver performed an examination and possibly ordered blood/urine tests and imaging (CT scan, x-rays, ultrasound). Many cases can be observed and treated at home after initial evaluation in the emergency department. Even though you are being discharged home, abdominal pain can be unpredictable. Therefore, you need a repeated exam if your pain does not resolve, returns, or worsens. Most patients with abdominal pain don't have to be admitted to the hospital or have surgery, but serious problems like appendicitis and gallbladder attacks can start out as nonspecific pain. Many abdominal conditions cannot be diagnosed in one visit, so follow-up evaluations are very important. Your scan showed that you have colitis, inflammation of the intestines, which occasionally is infectious. Take antibiotics as directed. Use your home pain medications as needed for pain. Use zofran as needed for nausea, and stay well hydrated. Follow up with your regular doctor in 1 week for recheck, and with your gastroenterologist at your already scheduled appointment in September. Return to the ER for changes or worsening symptoms. SEEK IMMEDIATE MEDICAL ATTENTION IF YOU DEVELOP ANY OF THE FOLLOWING SYMPTOMS:  The pain does not go away or becomes severe.   A temperature above 101 develops.   Repeated vomiting occurs (multiple episodes).   The pain becomes localized to portions of the abdomen. The right side could possibly be appendicitis. In an adult, the left lower portion of the abdomen could be colitis or diverticulitis.   Blood is being passed in stools or vomit (bright red or black tarry stools).   Return also if you develop chest pain, difficulty breathing, dizziness or fainting, or become confused, poorly responsive, or inconsolable (young children).  The constipation stays for more than 4 days.   There is belly (abdominal) or rectal pain.   You do not seem to  be getting better.     Abdominal Pain, Women Abdominal (stomach, pelvic, or belly) pain can be caused by many things. It is important to tell your doctor:  The location of the pain.  Does it come and go or is it present all the time?  Are there things that start the pain (eating certain foods, exercise)?  Are there other symptoms associated with the pain (fever, nausea, vomiting, diarrhea)? All of this is helpful to know when trying to find the cause of the pain. CAUSES   Stomach: virus or bacteria infection, or ulcer.  Intestine: appendicitis (inflamed appendix), regional ileitis (Crohn's disease), ulcerative colitis (inflamed colon), irritable bowel syndrome, diverticulitis (inflamed diverticulum of the colon), or cancer of the stomach or intestine.  Gallbladder disease or stones in the gallbladder.  Kidney disease, kidney stones, or infection.  Pancreas infection or cancer.  Fibromyalgia (pain disorder).  Diseases of the female organs:  Uterus: fibroid (non-cancerous) tumors or infection.  Fallopian tubes: infection or tubal pregnancy.  Ovary: cysts or tumors.  Pelvic adhesions (scar tissue).  Endometriosis (uterus lining tissue growing in the pelvis and on the pelvic organs).  Pelvic congestion syndrome (female organs filling up with blood just before the menstrual period).  Pain with the menstrual period.  Pain with ovulation (producing an egg).  Pain with an IUD (intrauterine device, birth control) in the uterus.  Cancer of the female organs.  Functional pain (pain not caused by a disease, may improve without treatment).  Psychological pain.  Depression. DIAGNOSIS  Your doctor will decide the seriousness of your pain by doing an examination.  Blood tests.  X-rays.  Ultrasound.  CT  scan (computed tomography, special type of X-ray).  MRI (magnetic resonance imaging).  Cultures, for infection.  Barium enema (dye inserted in the large intestine,  to better view it with X-rays).  Colonoscopy (looking in intestine with a lighted tube).  Laparoscopy (minor surgery, looking in abdomen with a lighted tube).  Major abdominal exploratory surgery (looking in abdomen with a large incision). TREATMENT  The treatment will depend on the cause of the pain.   Many cases can be observed and treated at home.  Over-the-counter medicines recommended by your caregiver.  Prescription medicine.  Antibiotics, for infection.  Birth control pills, for painful periods or for ovulation pain.  Hormone treatment, for endometriosis.  Nerve blocking injections.  Physical therapy.  Antidepressants.  Counseling with a psychologist or psychiatrist.  Minor or major surgery. HOME CARE INSTRUCTIONS   Do not take laxatives, unless directed by your caregiver.  Take over-the-counter pain medicine only if ordered by your caregiver. Do not take aspirin because it can cause an upset stomach or bleeding.  Try a clear liquid diet (broth or water) as ordered by your caregiver. Slowly move to a bland diet, as tolerated, if the pain is related to the stomach or intestine.  Have a thermometer and take your temperature several times a day, and record it.  Bed rest and sleep, if it helps the pain.  Avoid sexual intercourse, if it causes pain.  Avoid stressful situations.  Keep your follow-up appointments and tests, as your caregiver orders.  If the pain does not go away with medicine or surgery, you may try:  Acupuncture.  Relaxation exercises (yoga, meditation).  Group therapy.  Counseling. SEEK MEDICAL CARE IF:   You notice certain foods cause stomach pain.  Your home care treatment is not helping your pain.  You need stronger pain medicine.  You want your IUD removed.  You feel faint or lightheaded.  You develop nausea and vomiting.  You develop a rash.  You are having side effects or an allergy to your medicine. SEEK IMMEDIATE  MEDICAL CARE IF:   Your pain does not go away or gets worse.  You have a fever.  Your pain is felt only in portions of the abdomen. The right side could possibly be appendicitis. The left lower portion of the abdomen could be colitis or diverticulitis.  You are passing blood in your stools (bright red or black tarry stools, with or without vomiting).  You have blood in your urine.  You develop chills, with or without a fever.  You pass out. MAKE SURE YOU:   Understand these instructions.  Will watch your condition.  Will get help right away if you are not doing well or get worse. Document Released: 07/07/2007 Document Revised: 01/24/2014 Document Reviewed: 07/27/2009 [facility] Patient Information 2015 [place], [place]. This information is not intended to replace advice given to you by your health care provider. Make sure you discuss any questions you have with your health care provider.  Colitis Colitis is inflammation of the colon. Colitis can be a short-term or long-standing (chronic) illness. Crohn's disease and ulcerative colitis are 2 types of colitis which are chronic. They usually require lifelong treatment. CAUSES  There are many different causes of colitis, including:  Viruses.  Germs (bacteria).  Medicine reactions. SYMPTOMS   Diarrhea.  Intestinal bleeding.  Pain.  Fever.  Throwing up (vomiting).  Tiredness (fatigue).  Weight loss.  Bowel blockage. DIAGNOSIS  The diagnosis of colitis is based on examination and stool or blood  tests. X-rays, CT scan, and colonoscopy may also be needed. TREATMENT  Treatment may include:  Fluids given through the vein (intravenously).  Bowel rest (nothing to eat or drink for a period of time).  Medicine for pain and diarrhea.  Medicines (antibiotics) that kill germs.  Cortisone medicines.  Surgery. HOME CARE INSTRUCTIONS   Get plenty of rest.  Drink enough water and fluids to keep your urine clear or pale  yellow.  Eat a well-balanced diet.  Call your caregiver for follow-up as recommended. SEEK IMMEDIATE MEDICAL CARE IF:   You develop chills.  You have an oral temperature above 102 F (38.9 C), not controlled by medicine.  You have extreme weakness, fainting, or dehydration.  You have repeated vomiting.  You develop severe belly (abdominal) pain or are passing bloody or tarry stools. MAKE SURE YOU:   Understand these instructions.  Will watch your condition.  Will get help right away if you are not doing well or get worse. Document Released: 10/17/2004 Document Revised: 12/02/2011 Document Reviewed: 01/12/2010 [facility] Patient Information 2015 [place], [place]. This information is not intended to replace advice given to you by your health care provider. Make sure you discuss any questions you have with your health care provider.  Chronic Diarrhea Diarrhea is frequent loose and watery bowel movements. It can cause you to feel weak and dehydrated. Dehydration can cause you to become tired and thirsty and to have a dry mouth, decreased urination, and dark yellow urine. Diarrhea is a sign of another problem, most often an infection that will not last long. In most cases, diarrhea lasts 2-3 days. Diarrhea that lasts longer than 4 weeks is called long-lasting (chronic) diarrhea. It is important to treat your diarrhea as directed by your health care provider to lessen or prevent future episodes of diarrhea.  CAUSES  There are many causes of chronic diarrhea. The following are some possible causes:   Gastrointestinal infections caused by viruses, bacteria, or parasites.   Food poisoning or food allergies.   Certain medicines, such as antibiotics, chemotherapy, and laxatives.   Artificial sweeteners and fructose.   Digestive disorders, such as celiac disease and inflammatory bowel diseases.   Irritable bowel syndrome.  Some disorders of the pancreas.  Disorders of the  thyroid.  Reduced blood flow to the intestines.  Cancer. Sometimes the cause of chronic diarrhea is unknown. RISK FACTORS  Having a severely weakened immune system, such as from HIV or AIDS.   Taking certain types of cancer-fighting drugs (such as with chemotherapy) or other medicines.   Having had a recent organ transplant.   Having a portion of the stomach or small bowel removed.   Traveling to countries where food and water supplies are often contaminated.  SYMPTOMS  In addition to frequent, loose stools, diarrhea may cause:   Cramping.   Abdominal pain.   Nausea.   Fever.  Fatigue.  Urgent need to use the bathroom.  Loss of bowel control. DIAGNOSIS  Your health care provider must take a careful history and perform a physical exam. Tests given are based on your symptoms and history. Tests may include:   Blood or stool tests. Three or more stool samples may be examined. Stool cultures may be used to test for bacteria or parasites.   X-rays.   A procedure in which a thin tube is inserted into the mouth or rectum (endoscopy). This allows the health care provider to look inside the intestine.  TREATMENT   Treatment is aimed at  correcting the cause of the diarrhea when possible.  Diarrhea caused by an infection can often be treated with antibiotic medicines.  Diarrhea not caused by an infection may require you to take long-term medicine or have surgery. Specific treatment should be discussed with your health care provider.  If the cause cannot be determined, treatment aims to relieve symptoms and prevent dehydration. Serious health problems can occur if you do not maintain proper fluid levels. Treatment may include:  Taking an oral rehydration solution (ORS).  Not drinking beverages that contain caffeine (such as tea, coffee, and soft drinks).  Not drinking alcohol.  Maintaining well-balanced nutrition to help you recover faster. HOME CARE  INSTRUCTIONS   Drink enough fluids to keep urine clear or pale yellow. Drink 1 cup (8 oz) of fluid for each diarrhea episode. Avoid fluids that contain simple sugars, fruit juices, whole milk products, and sodas. Hydrate with an ORS. You may purchase the ORS or prepare it at home by mixing the following ingredients together:   - tsp (1.7-3  mL) table salt.   tsp (3  mL) baking soda.   tsp (1.7 mL) salt substitute containing potassium chloride.  1 tbsp (20 mL) sugar.  4.2 c (1 L) of water.   Certain foods and beverages may increase the speed at which food moves through the gastrointestinal (GI) tract. These foods and beverages should be avoided. They include:  Caffeinated and alcoholic beverages.  High-fiber foods, such as raw fruits and vegetables, nuts, seeds, and whole grain breads and cereals.  Foods and beverages sweetened with sugar alcohols, such as xylitol, sorbitol, and mannitol.   Some foods may be well tolerated and may help thicken stool. These include:  Starchy foods, such as rice, toast, pasta, low-sugar cereal, oatmeal, grits, baked potatoes, crackers, and bagels.  Bananas.  Applesauce.  Add probiotic-rich foods to help increase healthy bacteria in the GI tract. These include yogurt and fermented milk products.  Wash your hands well after each diarrhea episode.  Only take over-the-counter or prescription medicines as directed by your health care provider.  Take a warm bath to relieve any burning or pain from frequent diarrhea episodes. SEEK MEDICAL CARE IF:   You are not urinating as often.  Your urine is a dark color.  You become very tired or dizzy.  You have severe pain in the abdomen or rectum.  Your have blood or pus in your stools.  Your stools look black and tarry. SEEK IMMEDIATE MEDICAL CARE IF:   You are unable to keep fluids down.  You have persistent vomiting.  You have blood in your stool.  Your stools are black and tarry.  You  do not urinate in 6-8 hours, or there is only a small amount of very dark urine.  You have abdominal pain that increases or localizes.  You have weakness, dizziness, confusion, or lightheadedness.  You have a severe headache.  Your diarrhea gets worse or does not get better.  You have a fever or persistent symptoms for more than 2-3 days.  You have a fever and your symptoms suddenly get worse. MAKE SURE YOU:   Understand these instructions.  Will watch your condition.  Will get help right away if you are not doing well or get worse. Document Released: 11/30/2003 Document Revised: 09/14/2013 Document Reviewed: 03/04/2013 [facility] Patient Information 2015 [place], [place]. This information is not intended to replace advice given to you by your health care provider. Make sure you discuss any questions you have  with your health care provider.  Nausea and Vomiting Nausea is a sick feeling that often comes before throwing up (vomiting). Vomiting is a reflex where stomach contents come out of your mouth. Vomiting can cause severe loss of body fluids (dehydration). Children and elderly adults can become dehydrated quickly, especially if they also have diarrhea. Nausea and vomiting are symptoms of a condition or disease. It is important to find the cause of your symptoms. CAUSES   Direct irritation of the stomach lining. This irritation can result from increased acid production (gastroesophageal reflux disease), infection, food poisoning, taking certain medicines (such as nonsteroidal anti-inflammatory drugs), alcohol use, or tobacco use.  Signals from the brain.These signals could be caused by a headache, heat exposure, an inner ear disturbance, increased pressure in the brain from injury, infection, a tumor, or a concussion, pain, emotional stimulus, or metabolic problems.  An obstruction in the gastrointestinal tract (bowel obstruction).  Illnesses such as diabetes, hepatitis, gallbladder  problems, appendicitis, kidney problems, cancer, sepsis, atypical symptoms of a heart attack, or eating disorders.  Medical treatments such as chemotherapy and radiation.  Receiving medicine that makes you sleep (general anesthetic) during surgery. DIAGNOSIS Your caregiver may ask for tests to be done if the problems do not improve after a few days. Tests may also be done if symptoms are severe or if the reason for the nausea and vomiting is not clear. Tests may include:  Urine tests.  Blood tests.  Stool tests.  Cultures (to look for evidence of infection).  X-rays or other imaging studies. Test results can help your caregiver make decisions about treatment or the need for additional tests. TREATMENT You need to stay well hydrated. Drink frequently but in small amounts.You may wish to drink water, sports drinks, clear broth, or eat frozen ice pops or gelatin dessert to help stay hydrated.When you eat, eating slowly may help prevent nausea.There are also some antinausea medicines that may help prevent nausea. HOME CARE INSTRUCTIONS   Take all medicine as directed by your caregiver.  If you do not have an appetite, do not force yourself to eat. However, you must continue to drink fluids.  If you have an appetite, eat a normal diet unless your caregiver tells you differently.  Eat a variety of complex carbohydrates (rice, wheat, potatoes, bread), lean meats, yogurt, fruits, and vegetables.  Avoid high-fat foods because they are more difficult to digest.  Drink enough water and fluids to keep your urine clear or pale yellow.  If you are dehydrated, ask your caregiver for specific rehydration instructions. Signs of dehydration may include:  Severe thirst.  Dry lips and mouth.  Dizziness.  Dark urine.  Decreasing urine frequency and amount.  Confusion.  Rapid breathing or pulse. SEEK IMMEDIATE MEDICAL CARE IF:   You have blood or brown flecks (like coffee grounds) in  your vomit.  You have black or bloody stools.  You have a severe headache or stiff neck.  You are confused.  You have severe abdominal pain.  You have chest pain or trouble breathing.  You do not urinate at least once every 8 hours.  You develop cold or clammy skin.  You continue to vomit for longer than 24 to 48 hours.  You have a fever. MAKE SURE YOU:   Understand these instructions.  Will watch your condition.  Will get help right away if you are not doing well or get worse. Document Released: 09/09/2005 Document Revised: 12/02/2011 Document Reviewed: 02/06/2011 [employer] Patient Information 2015  [employer]. This information is not intended to replace advice given to you by your health care provider. Make sure you discuss any questions you have with your health care provider.

## 2015-05-20 NOTE — ED Notes (Signed)
Nurse drawing Bethena Roys

## 2015-05-25 ENCOUNTER — Emergency Department (HOSPITAL_COMMUNITY)
Admission: EM | Admit: 2015-05-25 | Discharge: 2015-05-25 | Disposition: A | Discharge status: Home / Self Care | Payer: Medicaid Other | Attending: Emergency Medicine | Admitting: Emergency Medicine

## 2015-05-25 ENCOUNTER — Encounter (HOSPITAL_COMMUNITY): Payer: Self-pay | Admitting: Emergency Medicine

## 2015-05-25 ENCOUNTER — Emergency Department (HOSPITAL_COMMUNITY): Payer: Medicaid Other

## 2015-05-25 DIAGNOSIS — Z79899 Other long term (current) drug therapy: Secondary | ICD-10-CM | POA: Insufficient documentation

## 2015-05-25 DIAGNOSIS — J45909 Unspecified asthma, uncomplicated: Secondary | ICD-10-CM | POA: Insufficient documentation

## 2015-05-25 DIAGNOSIS — R1012 Left upper quadrant pain: Secondary | ICD-10-CM

## 2015-05-25 DIAGNOSIS — Z87891 Personal history of nicotine dependence: Secondary | ICD-10-CM | POA: Diagnosis not present

## 2015-05-25 DIAGNOSIS — Z3202 Encounter for pregnancy test, result negative: Secondary | ICD-10-CM | POA: Diagnosis not present

## 2015-05-25 DIAGNOSIS — Z7951 Long term (current) use of inhaled steroids: Secondary | ICD-10-CM | POA: Diagnosis not present

## 2015-05-25 DIAGNOSIS — D649 Anemia, unspecified: Secondary | ICD-10-CM | POA: Insufficient documentation

## 2015-05-25 DIAGNOSIS — Z880 Allergy status to penicillin: Secondary | ICD-10-CM | POA: Insufficient documentation

## 2015-05-25 DIAGNOSIS — R1110 Vomiting, unspecified: Secondary | ICD-10-CM

## 2015-05-25 DIAGNOSIS — Z8669 Personal history of other diseases of the nervous system and sense organs: Secondary | ICD-10-CM | POA: Insufficient documentation

## 2015-05-25 DIAGNOSIS — K529 Noninfective gastroenteritis and colitis, unspecified: Secondary | ICD-10-CM

## 2015-05-25 LAB — URINALYSIS, ROUTINE W REFLEX MICROSCOPIC
GLUCOSE, UA: NEGATIVE mg/dL
HGB URINE DIPSTICK: NEGATIVE
KETONES UR: 15 mg/dL — AB
Leukocytes, UA: NEGATIVE
Nitrite: NEGATIVE
PH: 5.5 (ref 5.0–8.0)
Protein, ur: NEGATIVE mg/dL
SPECIFIC GRAVITY, URINE: 1.014 (ref 1.005–1.030)
Urobilinogen, UA: 2 mg/dL — ABNORMAL HIGH (ref 0.0–1.0)

## 2015-05-25 LAB — CBC WITH DIFFERENTIAL/PLATELET
BASOS PCT: 0 % (ref 0–1)
Basophils Absolute: 0 10*3/uL (ref 0.0–0.1)
EOS PCT: 6 % — AB (ref 0–5)
Eosinophils Absolute: 0.4 10*3/uL (ref 0.0–0.7)
HCT: 32.2 % — ABNORMAL LOW (ref 36.0–46.0)
HEMOGLOBIN: 9.2 g/dL — AB (ref 12.0–15.0)
LYMPHS PCT: 27 % (ref 12–46)
Lymphs Abs: 1.8 10*3/uL (ref 0.7–4.0)
MCH: 17.7 pg — AB (ref 26.0–34.0)
MCHC: 28.6 g/dL — ABNORMAL LOW (ref 30.0–36.0)
MCV: 62 fL — AB (ref 78.0–100.0)
MONO ABS: 1.1 10*3/uL — AB (ref 0.1–1.0)
Monocytes Relative: 16 % — ABNORMAL HIGH (ref 3–12)
NEUTROS PCT: 51 % (ref 43–77)
Neutro Abs: 3.4 10*3/uL (ref 1.7–7.7)
PLATELETS: 479 10*3/uL — AB (ref 150–400)
RBC: 5.19 MIL/uL — AB (ref 3.87–5.11)
RDW: 20.8 % — ABNORMAL HIGH (ref 11.5–15.5)
WBC: 6.7 10*3/uL (ref 4.0–10.5)

## 2015-05-25 LAB — LIPASE, BLOOD: Lipase: <10 U/L — ABNORMAL LOW (ref 22–51)

## 2015-05-25 LAB — COMPREHENSIVE METABOLIC PANEL
ALK PHOS: 74 U/L (ref 38–126)
ALT: 14 U/L (ref 14–54)
AST: 22 U/L (ref 15–41)
Albumin: 2.9 g/dL — ABNORMAL LOW (ref 3.5–5.0)
Anion gap: 10 (ref 5–15)
BUN: 6 mg/dL (ref 6–20)
CALCIUM: 8.6 mg/dL — AB (ref 8.9–10.3)
CHLORIDE: 101 mmol/L (ref 101–111)
CO2: 27 mmol/L (ref 22–32)
CREATININE: 0.53 mg/dL (ref 0.44–1.00)
Glucose, Bld: 86 mg/dL (ref 65–99)
Potassium: 3.1 mmol/L — ABNORMAL LOW (ref 3.5–5.1)
SODIUM: 138 mmol/L (ref 135–145)
Total Bilirubin: 1 mg/dL (ref 0.3–1.2)
Total Protein: 7.1 g/dL (ref 6.5–8.1)

## 2015-05-25 LAB — I-STAT BETA HCG BLOOD, ED (MC, WL, AP ONLY): I-stat hCG, quantitative: <5 m[IU]/mL (ref <5)

## 2015-05-25 LAB — I-STAT CG4 LACTIC ACID, ED: LACTIC ACID, VENOUS: 0.84 mmol/L (ref 0.5–2.0)

## 2015-05-25 MED ORDER — SODIUM CHLORIDE 0.9 % IV BOLUS (SEPSIS)
1000.0000 mL | Freq: Once | INTRAVENOUS | Status: AC
  Administered 2015-05-25: 1000 mL via INTRAVENOUS

## 2015-05-25 MED ORDER — IOHEXOL 300 MG/ML  SOLN
50.0000 mL | Freq: Once | INTRAMUSCULAR | Status: DC | PRN
  Administered 2015-05-25: 50 mL via ORAL

## 2015-05-25 MED ORDER — POTASSIUM CHLORIDE CRYS ER 20 MEQ PO TBCR
80.0000 meq | EXTENDED_RELEASE_TABLET | Freq: Once | ORAL | Status: AC
  Administered 2015-05-25: 80 meq via ORAL
  Filled 2015-05-25: qty 4

## 2015-05-25 MED ORDER — MORPHINE SULFATE (PF) 4 MG/ML IV SOLN
4.0000 mg | Freq: Once | INTRAVENOUS | Status: AC
  Administered 2015-05-25: 4 mg via INTRAVENOUS
  Filled 2015-05-25: qty 1

## 2015-05-25 MED ORDER — ONDANSETRON HCL 4 MG PO TABS: 4.0000 mg | ORAL_TABLET | Freq: Four times a day (QID) | ORAL | Status: DC

## 2015-05-25 MED ORDER — ONDANSETRON HCL 4 MG/2ML IJ SOLN
4.0000 mg | Freq: Once | INTRAMUSCULAR | Status: AC
  Administered 2015-05-25: 4 mg via INTRAVENOUS
  Filled 2015-05-25: qty 2

## 2015-05-25 MED ORDER — IOHEXOL 300 MG/ML  SOLN
100.0000 mL | Freq: Once | INTRAMUSCULAR | Status: AC | PRN
  Administered 2015-05-25: 100 mL via INTRAVENOUS

## 2015-05-25 NOTE — ED Notes (Signed)
Nurse drawing labs. 

## 2015-05-25 NOTE — ED Notes (Signed)
Patient c/o medial abdominal pain radiating to left side x several months, along with nausea and emesis onset in July. Patient was seen in ED 5 days ago for the same, prescribed Abx, pt has been unable to keep Abx down as she vomits when she eats or takes pills.

## 2015-05-25 NOTE — ED Provider Notes (Signed)
3:53 PM Patient signed out to me at shift change by [facility], PA-C.  Patient presents today with LUQ abdominal pain.  She was seen in the ED five days ago for the same and had a CT scan done, which showed Colitis.  She was started on antibiotics, but only took one dose.  She has had repetitive vomiting since that time.  Labs today unremarkable.  CT ab/pelvis pending.  Plan is for the patient to be discharged home with antiemetic if the scan is negative.  4:20 PM IMPRESSION: Findings consistent with colitis of the descending colon, not markedly changed compare to the patient's recent CT abdomen and pelvis. No new abnormality.  Fatty infiltration of the liver.  Cardiomegaly.  CT results as above.  Feel that the patient is stable for discharge.  Patient given Rx for Zofran.  No emesis during ED course.  Tolerating PO liquids.  Patient given referral to GI.  Instructed to take her Cipro and Flagyl.  Return precautions given.  Hyman Bible, PA-C 05/25/15 [place], PA-C 05/25/15 [id number]  Milton Ferguson, MD 05/25/15 [phone]

## 2015-05-25 NOTE — ED Provider Notes (Signed)
CSN: [id number]     Arrival date & time 05/25/15  1142 History   First MD Initiated Contact with Patient 05/25/15 1151     Chief Complaint  Patient presents with  . Abdominal Pain     (Consider location/radiation/quality/duration/timing/severity/associated sxs/prior Treatment) Patient is a 34 y.o. female presenting with abdominal pain. The history is provided by the patient and medical records. No language interpreter was used.  Abdominal Pain Associated symptoms: diarrhea ( chronic, unchanged for the last several months), nausea and vomiting ( worse in the last few days)   Associated symptoms: no chest pain, no constipation, no cough, no dysuria, no fatigue, no fever, no hematuria and no shortness of breath      Dominique [name] is a 34 y.o. female  with a hx of morbid obesity, anemia, asthma, OSA, thyromegaly, chronic sinus tachycardia, diverticulosis, and hepatic steatosis presents to the Emergency Department complaining of gradual, persistent, progressively worsening generalized abdominal pain onset Feb 2016.  She reports this is associated with an anorexia and significant weight loss of over 100 lbs.  She also reports intermittent vomiting and diarrhea.  Pt reports that she was seen here on 05/20/15 for a "new pain" in her left side.  She states pain is a 7/10 constant pulling pain in the left lateral upper abdomen, nonradiating and worse with She reports that the pain was improved with her home Norco, but she ran out of this last night She reports she filled her Abx 3 days ago without relief, but did not take any yesterday or today.  Pt report taking Zofran at home with some improvement.  Pt reports nausea and vomiting more consistently for the last few days.  No melena or hematochezia.  Pt denies fever, chills, headache, neck pain, chest pain, SOB, weakness, dizziness, syncope, dysuria, hematuria, vaginal discharge.     Pt reports an appointment with GI on Tuesday Sept 6, 2016.     Past  Medical History  Diagnosis Date  . Asthma   . Sleep apnea   . Anemia   . Morbid obesity 03/22/2009    Qualifier: Diagnosis of  By: Ronnald Ramp CMA, Chemira    . OSA (obstructive sleep apnea) 05/02/2014  . Thyromegaly 05/02/2014  . Seasonal allergies 06/13/2014  . Acute acalculous cholecystitis 11/16/2014  . Diverticulitis of colon 11/17/2014  . Hepatic steatosis 11/17/2014   Past Surgical History  Procedure Laterality Date  . C section 2011  2011   Family History  Problem Relation Age of Onset  . Hypertension Mother   . Hypertension Father   . Hypertension Sister   . Asthma Brother   . Diabetes Maternal Grandmother   . Hypertension Maternal Grandmother   . Hypertension Maternal Grandfather   . Cancer Paternal Grandmother     lung  . Emphysema Paternal Grandfather   . Allergies Mother    Social History  Substance Use Topics  . Smoking status: Former Smoker -- 0.50 packs/day for 4 years    Types: Cigarettes    Start date: 07/24/2014  . Smokeless tobacco: Never Used  . Alcohol Use: No   OB History    No data available     Review of Systems  Constitutional: Positive for unexpected weight change. Negative for fever, diaphoresis, appetite change and fatigue.  HENT: Negative for mouth sores.   Eyes: Negative for visual disturbance.  Respiratory: Negative for cough, chest tightness, shortness of breath and wheezing.   Cardiovascular: Negative for chest pain.  Gastrointestinal: Positive for nausea, vomiting (  worse in the last few days), abdominal pain and diarrhea ( chronic, unchanged for the last several months). Negative for constipation.  Endocrine: Negative for polydipsia, polyphagia and polyuria.  Genitourinary: Negative for dysuria, urgency, frequency and hematuria.  Musculoskeletal: Negative for back pain and neck stiffness.  Skin: Negative for rash.  Allergic/Immunologic: Negative for immunocompromised state.  Neurological: Negative for syncope, light-headedness and  headaches.  Hematological: Does not bruise/bleed easily.  Psychiatric/Behavioral: Negative for sleep disturbance. The patient is not nervous/anxious.       Allergies  Other and Penicillins  Home Medications   Prior to Admission medications   Medication Sig Start Date End Date Taking? Authorizing Provider  albuterol (PROVENTIL) (2.5 MG/3ML) 0.083% nebulizer solution Take 3 mLs (2.5 mg total) by nebulization every 6 (six) hours as needed for wheezing or shortness of breath. 10/27/14  Yes Rigoberto Noel, MD  Aspirin-Salicylamide-Caffeine (BC HEADACHE PO) Take 1 packet by mouth daily as needed (headache).   Yes Historical Provider, MD  beclomethasone (QVAR) 80 MCG/ACT inhaler Inhale 2 puffs into the lungs 2 (two) times daily. 05/02/14  Yes Midge Minium, MD  ciprofloxacin (CIPRO) 500 MG tablet Take 1 tablet (500 mg total) by mouth 2 (two) times daily. One po bid x 7 days 05/20/15  Yes Mercedes Camprubi-Soms, PA-C  ferrous sulfate 325 (65 FE) MG tablet Take 2 tablets (650 mg total) by mouth daily with breakfast. 04/07/15  Yes Midge Minium, MD  HYDROcodone-acetaminophen (NORCO/VICODIN) 5-325 MG per tablet TAKE 1 TABLET BY MOUTH EVERY 6 HOURS AS NEEDED FOR PAIN 05/15/15  Yes Historical Provider, MD  metroNIDAZOLE (FLAGYL) 500 MG tablet Take 1 tablet (500 mg total) by mouth 3 (three) times daily. One po tid x 7 days 05/20/15  Yes Mercedes Camprubi-Soms, PA-C  ondansetron (ZOFRAN) 8 MG tablet Take 1 tablet (8 mg total) by mouth every 8 (eight) hours as needed for nausea or vomiting. 05/20/15  Yes Mercedes Camprubi-Soms, PA-C  PROAIR HFA 108 (90 BASE) MCG/ACT inhaler INHALE 2 PUFFS BY MOUTH INTO THE LUNGS EVERY 6 HOURS AS NEEDED FOR WHEEZING OR SHORTNESS OF BREATH 02/17/15  Yes Midge Minium, MD  torsemide (DEMADEX) 20 MG tablet Take 1 tablet (20 mg total) by mouth daily. Patient taking differently: Take 20 mg by mouth daily as needed (swelling).  12/01/14  Yes Midge Minium, MD   cyclobenzaprine (FLEXERIL) 10 MG tablet Take 1 tablet (10 mg total) by mouth at bedtime. Patient not taking: Reported on 05/25/2015 04/07/15   Midge Minium, MD  pantoprazole (PROTONIX) 40 MG tablet Take 1 tablet (40 mg total) by mouth daily. Patient not taking: Reported on 05/20/2015 03/24/15   Delice Bison Ward, DO  ranitidine (ZANTAC) 150 MG capsule Take 1 capsule (150 mg total) by mouth 2 (two) times daily. Patient not taking: Reported on 05/20/2015 04/25/15   Mackie Pai, PA-C   BP 148/67 mmHg  Pulse 92  Temp(Src) 98.5 F (36.9 C) (Oral)  Resp 20  Ht 5' 2"  (1.575 m)  Wt 380 lb (172.367 kg)  BMI 69.49 kg/m2  SpO2 97%  LMP 05/06/2015 Physical Exam  Constitutional: She appears well-developed and well-nourished. No distress.  Awake, alert, nontoxic appearance  HENT:  Head: Normocephalic and atraumatic.  Mouth/Throat: Oropharynx is clear and moist. No oropharyngeal exudate.  Eyes: Conjunctivae are normal. No scleral icterus.  Neck: Normal range of motion. Neck supple.  Cardiovascular: Normal rate, regular rhythm, normal heart sounds and intact distal pulses.   Pulmonary/Chest: Effort normal and breath  sounds normal. No respiratory distress. She has no wheezes.  Equal chest expansion  Abdominal: Soft. Bowel sounds are normal. She exhibits no distension and no mass. There is tenderness in the left upper quadrant. There is guarding. There is no rebound.  Tenderness to palpation with guarding in the left upper quadrant No CVA tenderness  Musculoskeletal: Normal range of motion. She exhibits no edema.  Neurological: She is alert.  Speech is clear and goal oriented Moves extremities without ataxia  Skin: Skin is warm and dry. She is not diaphoretic.  Psychiatric: She has a normal mood and affect.  Nursing note and vitals reviewed.   ED Course  Procedures (including critical care time) Labs Review Labs Reviewed  CBC WITH DIFFERENTIAL/PLATELET - Abnormal; Notable for the following:     RBC 5.19 (*)    Hemoglobin 9.2 (*)    HCT 32.2 (*)    MCV 62.0 (*)    MCH 17.7 (*)    MCHC 28.6 (*)    RDW 20.8 (*)    Platelets 479 (*)    Monocytes Relative 16 (*)    Eosinophils Relative 6 (*)    Monocytes Absolute 1.1 (*)    All other components within normal limits  COMPREHENSIVE METABOLIC PANEL - Abnormal; Notable for the following:    Potassium 3.1 (*)    Calcium 8.6 (*)    Albumin 2.9 (*)    All other components within normal limits  LIPASE, BLOOD - Abnormal; Notable for the following:    Lipase <10 (*)    All other components within normal limits  URINALYSIS, ROUTINE W REFLEX MICROSCOPIC (NOT AT [facility])  I-STAT CG4 LACTIC ACID, ED  I-STAT BETA HCG BLOOD, ED ([facility], [facility], [facility] ONLY)    Imaging Review No results found. I have personally reviewed and evaluated these images and lab results as part of my medical decision-making.   EKG Interpretation None      MDM   Final diagnoses:  LUQ abdominal pain  Colitis  Vomiting in adult   Dominique [name] presents with persistent abdominal pain and left upper quadrant pain since she was evaluated on 05/20/2015. She reports that she's been unable to take her antibiotics will keep down any food since she was seen last.  Record review shows a CT scan on 05/20/2015 with evidence of colitis.  As patient presents tachycardic today with reports of intractable vomiting will repeat CT scan to ensure no perforation or abscess. She is afebrile without hypotension.  2:13 PM Hypokalemia noted and repleted here in the department. No leukocytosis. Hemoglobin baseline at 9.2. No elevation in her lipase.    3:21 PM Abd remains soft.  Pt resting comfortably.  CT scan pending.     3:42 PM Care transferred to [facility], PA-C.  CT pending.  If CT is without abscess or perforation, pt may be d/c home with already scheduled GI follow-up.  D/c home with antiemetics and encouragement to take her antibiotics.    BP 148/67 mmHg  Pulse 92   Temp(Src) 98.5 F (36.9 C) (Oral)  Resp 20  Ht 5' 2"  (1.575 m)  Wt 380 lb (172.367 kg)  BMI 69.49 kg/m2  SpO2 97%  LMP 05/06/2015   Abigail Butts, PA-C 05/25/15 1542  Pattricia Boss, MD 05/27/15 1235

## 2015-05-25 NOTE — Discharge Instructions (Signed)
It is recommended that you take the antibiotics that you were prescribed at your last visit.  Take Zofran as prescribed as needed for nausea.

## 2015-06-03 ENCOUNTER — Encounter (HOSPITAL_COMMUNITY): Payer: Self-pay | Admitting: Emergency Medicine

## 2015-06-03 ENCOUNTER — Inpatient Hospital Stay (HOSPITAL_COMMUNITY)
Admission: EM | Admit: 2015-06-03 | Discharge: 2015-06-26 | DRG: 329 | Disposition: A | Discharge status: Rehabilitation Facility | Payer: Medicaid Other | Attending: Internal Medicine | Admitting: Internal Medicine

## 2015-06-03 DIAGNOSIS — R079 Chest pain, unspecified: Secondary | ICD-10-CM

## 2015-06-03 DIAGNOSIS — I5022 Chronic systolic (congestive) heart failure: Secondary | ICD-10-CM | POA: Diagnosis present

## 2015-06-03 DIAGNOSIS — K5900 Constipation, unspecified: Secondary | ICD-10-CM | POA: Diagnosis not present

## 2015-06-03 DIAGNOSIS — J45909 Unspecified asthma, uncomplicated: Secondary | ICD-10-CM | POA: Diagnosis present

## 2015-06-03 DIAGNOSIS — D62 Acute posthemorrhagic anemia: Secondary | ICD-10-CM | POA: Diagnosis not present

## 2015-06-03 DIAGNOSIS — Z9989 Dependence on other enabling machines and devices: Secondary | ICD-10-CM

## 2015-06-03 DIAGNOSIS — E440 Moderate protein-calorie malnutrition: Secondary | ICD-10-CM | POA: Diagnosis present

## 2015-06-03 DIAGNOSIS — E871 Hypo-osmolality and hyponatremia: Secondary | ICD-10-CM | POA: Diagnosis not present

## 2015-06-03 DIAGNOSIS — K651 Peritoneal abscess: Secondary | ICD-10-CM | POA: Diagnosis present

## 2015-06-03 DIAGNOSIS — L089 Local infection of the skin and subcutaneous tissue, unspecified: Secondary | ICD-10-CM

## 2015-06-03 DIAGNOSIS — N19 Unspecified kidney failure: Secondary | ICD-10-CM

## 2015-06-03 DIAGNOSIS — I429 Cardiomyopathy, unspecified: Secondary | ICD-10-CM | POA: Diagnosis present

## 2015-06-03 DIAGNOSIS — R739 Hyperglycemia, unspecified: Secondary | ICD-10-CM | POA: Diagnosis not present

## 2015-06-03 DIAGNOSIS — E861 Hypovolemia: Secondary | ICD-10-CM | POA: Diagnosis not present

## 2015-06-03 DIAGNOSIS — N289 Disorder of kidney and ureter, unspecified: Secondary | ICD-10-CM

## 2015-06-03 DIAGNOSIS — L8990 Pressure ulcer of unspecified site, unspecified stage: Secondary | ICD-10-CM | POA: Diagnosis present

## 2015-06-03 DIAGNOSIS — N170 Acute kidney failure with tubular necrosis: Secondary | ICD-10-CM | POA: Diagnosis not present

## 2015-06-03 DIAGNOSIS — Z6841 Body Mass Index (BMI) 40.0 and over, adult: Secondary | ICD-10-CM

## 2015-06-03 DIAGNOSIS — Z452 Encounter for adjustment and management of vascular access device: Secondary | ICD-10-CM

## 2015-06-03 DIAGNOSIS — E43 Unspecified severe protein-calorie malnutrition: Secondary | ICD-10-CM | POA: Diagnosis present

## 2015-06-03 DIAGNOSIS — R1084 Generalized abdominal pain: Secondary | ICD-10-CM

## 2015-06-03 DIAGNOSIS — E662 Morbid (severe) obesity with alveolar hypoventilation: Secondary | ICD-10-CM | POA: Diagnosis present

## 2015-06-03 DIAGNOSIS — I519 Heart disease, unspecified: Secondary | ICD-10-CM

## 2015-06-03 DIAGNOSIS — I272 Other secondary pulmonary hypertension: Secondary | ICD-10-CM | POA: Diagnosis present

## 2015-06-03 DIAGNOSIS — J9690 Respiratory failure, unspecified, unspecified whether with hypoxia or hypercapnia: Secondary | ICD-10-CM

## 2015-06-03 DIAGNOSIS — K650 Generalized (acute) peritonitis: Secondary | ICD-10-CM | POA: Diagnosis present

## 2015-06-03 DIAGNOSIS — J9600 Acute respiratory failure, unspecified whether with hypoxia or hypercapnia: Secondary | ICD-10-CM | POA: Diagnosis present

## 2015-06-03 DIAGNOSIS — G9340 Encephalopathy, unspecified: Secondary | ICD-10-CM | POA: Diagnosis not present

## 2015-06-03 DIAGNOSIS — I471 Supraventricular tachycardia: Secondary | ICD-10-CM | POA: Diagnosis not present

## 2015-06-03 DIAGNOSIS — Z87891 Personal history of nicotine dependence: Secondary | ICD-10-CM

## 2015-06-03 DIAGNOSIS — K631 Perforation of intestine (nontraumatic): Secondary | ICD-10-CM | POA: Diagnosis not present

## 2015-06-03 DIAGNOSIS — T148XXA Other injury of unspecified body region, initial encounter: Secondary | ICD-10-CM

## 2015-06-03 DIAGNOSIS — A419 Sepsis, unspecified organism: Secondary | ICD-10-CM | POA: Diagnosis not present

## 2015-06-03 DIAGNOSIS — G4733 Obstructive sleep apnea (adult) (pediatric): Secondary | ICD-10-CM | POA: Diagnosis present

## 2015-06-03 DIAGNOSIS — K50114 Crohn's disease of large intestine with abscess: Principal | ICD-10-CM | POA: Diagnosis present

## 2015-06-03 DIAGNOSIS — K50118 Crohn's disease of large intestine with other complication: Secondary | ICD-10-CM | POA: Diagnosis present

## 2015-06-03 DIAGNOSIS — N179 Acute kidney failure, unspecified: Secondary | ICD-10-CM | POA: Diagnosis present

## 2015-06-03 DIAGNOSIS — J9601 Acute respiratory failure with hypoxia: Secondary | ICD-10-CM | POA: Diagnosis not present

## 2015-06-03 DIAGNOSIS — D509 Iron deficiency anemia, unspecified: Secondary | ICD-10-CM | POA: Diagnosis present

## 2015-06-03 DIAGNOSIS — K5010 Crohn's disease of large intestine without complications: Secondary | ICD-10-CM | POA: Diagnosis present

## 2015-06-03 LAB — CBC
HEMATOCRIT: 33.8 % — AB (ref 36.0–46.0)
HEMOGLOBIN: 10.2 g/dL — AB (ref 12.0–15.0)
MCH: 18.5 pg — AB (ref 26.0–34.0)
MCHC: 30.2 g/dL (ref 30.0–36.0)
MCV: 61.3 fL — AB (ref 78.0–100.0)
Platelets: 498 10*3/uL — ABNORMAL HIGH (ref 150–400)
RBC: 5.51 MIL/uL — AB (ref 3.87–5.11)
RDW: 21.5 % — ABNORMAL HIGH (ref 11.5–15.5)
WBC: 6.6 10*3/uL (ref 4.0–10.5)

## 2015-06-03 LAB — COMPREHENSIVE METABOLIC PANEL
ALT: 15 U/L (ref 14–54)
AST: 22 U/L (ref 15–41)
Albumin: 2.5 g/dL — ABNORMAL LOW (ref 3.5–5.0)
Alkaline Phosphatase: 79 U/L (ref 38–126)
Anion gap: 11 (ref 5–15)
BUN: <5 mg/dL — ABNORMAL LOW (ref 6–20)
CO2: 24 mmol/L (ref 22–32)
Calcium: 8.4 mg/dL — ABNORMAL LOW (ref 8.9–10.3)
Chloride: 102 mmol/L (ref 101–111)
Creatinine, Ser: 0.72 mg/dL (ref 0.44–1.00)
Glucose, Bld: 84 mg/dL (ref 65–99)
POTASSIUM: 3.7 mmol/L (ref 3.5–5.1)
SODIUM: 137 mmol/L (ref 135–145)
Total Bilirubin: 0.9 mg/dL (ref 0.3–1.2)
Total Protein: 7.2 g/dL (ref 6.5–8.1)

## 2015-06-03 LAB — LIPASE, BLOOD: LIPASE: 11 U/L — AB (ref 22–51)

## 2015-06-03 MED ORDER — ONDANSETRON HCL 4 MG/2ML IJ SOLN
4.0000 mg | Freq: Once | INTRAMUSCULAR | Status: AC
  Administered 2015-06-03: 4 mg via INTRAVENOUS
  Filled 2015-06-03: qty 2

## 2015-06-03 MED ORDER — SODIUM CHLORIDE 0.9 % IV BOLUS (SEPSIS)
1000.0000 mL | Freq: Once | INTRAVENOUS | Status: AC
  Administered 2015-06-03: 1000 mL via INTRAVENOUS

## 2015-06-03 NOTE — ED Notes (Signed)
Pt reports abd pain which started today. Pt has hx of the same pain. Pt has hx of diverticulitis but this feels different. Pt alert x4.

## 2015-06-03 NOTE — ED Provider Notes (Signed)
CSN: [id number]     Arrival date & time 06/03/15  1739 History   First MD Initiated Contact with Patient 06/03/15 2305     Chief Complaint  Patient presents with  . Abdominal Pain     Patient is a 34 y.o. female presenting with abdominal pain. The history is provided by the patient. No language interpreter was used.  Abdominal Pain  Dominique [name] presents for evaluation of abdominal pain and vomiting. She reports that she's had chronic abdominal pain since January of this year when she was diagnosed with colitis. With the last week she's had worsening symptoms. She initially had left-sided abdominal pain and now she has diffuse abdominal pain. It worse over the lower abdomen and it radiates up to her about upper abdomen. She describes the pain as a crunching sensation. The pain is waxing and waning and worse with activity. She denies any fevers. She's had intermittent vomiting for the last 8 months. She reports diarrhea since June, 4-5 loose stools daily. She denies fevers. She saw her gastroenterologist (Sam G. with [facility]) one week ago and was told to continue her antibiotics for diverticulitis. She has been taking the antibiotics that vomited these up.  Past Medical History  Diagnosis Date  . Asthma   . Sleep apnea   . Anemia   . Morbid obesity 03/22/2009    Qualifier: Diagnosis of  By: Ronnald Ramp CMA, Chemira    . OSA (obstructive sleep apnea) 05/02/2014  . Thyromegaly 05/02/2014  . Seasonal allergies 06/13/2014  . Acute acalculous cholecystitis 11/16/2014  . Diverticulitis of colon 11/17/2014  . Hepatic steatosis 11/17/2014   Past Surgical History  Procedure Laterality Date  . C section 2011  2011   Family History  Problem Relation Age of Onset  . Hypertension Mother   . Hypertension Father   . Hypertension Sister   . Asthma Brother   . Diabetes Maternal Grandmother   . Hypertension Maternal Grandmother   . Hypertension Maternal Grandfather   . Cancer Paternal Grandmother     lung  .  Emphysema Paternal Grandfather   . Allergies Mother    Social History  Substance Use Topics  . Smoking status: Former Smoker -- 0.50 packs/day for 4 years    Types: Cigarettes    Start date: 07/24/2014  . Smokeless tobacco: Never Used  . Alcohol Use: No   OB History    No data available     Review of Systems  Gastrointestinal: Positive for abdominal pain.  All other systems reviewed and are negative.     Allergies  Other and Penicillins  Home Medications   Prior to Admission medications   Medication Sig Start Date End Date Taking? Authorizing Provider  albuterol (PROVENTIL) (2.5 MG/3ML) 0.083% nebulizer solution Take 3 mLs (2.5 mg total) by nebulization every 6 (six) hours as needed for wheezing or shortness of breath. 10/27/14   Rigoberto Noel, MD  Aspirin-Salicylamide-Caffeine (BC HEADACHE PO) Take 1 packet by mouth daily as needed (headache).    Historical Provider, MD  beclomethasone (QVAR) 80 MCG/ACT inhaler Inhale 2 puffs into the lungs 2 (two) times daily. 05/02/14   Midge Minium, MD  ciprofloxacin (CIPRO) 500 MG tablet Take 1 tablet (500 mg total) by mouth 2 (two) times daily. One po bid x 7 days 05/20/15   Mercedes Camprubi-Soms, PA-C  cyclobenzaprine (FLEXERIL) 10 MG tablet Take 1 tablet (10 mg total) by mouth at bedtime. Patient not taking: Reported on 05/25/2015 04/07/15   Belenda Cruise  Wadie Lessen, MD  ferrous sulfate 325 (65 FE) MG tablet Take 2 tablets (650 mg total) by mouth daily with breakfast. 04/07/15   Midge Minium, MD  HYDROcodone-acetaminophen (NORCO/VICODIN) 5-325 MG per tablet TAKE 1 TABLET BY MOUTH EVERY 6 HOURS AS NEEDED FOR PAIN 05/15/15   Historical Provider, MD  metroNIDAZOLE (FLAGYL) 500 MG tablet Take 1 tablet (500 mg total) by mouth 3 (three) times daily. One po tid x 7 days 05/20/15   Mercedes Camprubi-Soms, PA-C  ondansetron (ZOFRAN) 4 MG tablet Take 1 tablet (4 mg total) by mouth every 6 (six) hours. 05/25/15   Heather Laisure, PA-C  pantoprazole  (PROTONIX) 40 MG tablet Take 1 tablet (40 mg total) by mouth daily. Patient not taking: Reported on 05/20/2015 03/24/15   Kristen N Ward, DO  PROAIR HFA 108 (90 BASE) MCG/ACT inhaler INHALE 2 PUFFS BY MOUTH INTO THE LUNGS EVERY 6 HOURS AS NEEDED FOR WHEEZING OR SHORTNESS OF BREATH 02/17/15   Midge Minium, MD  ranitidine (ZANTAC) 150 MG capsule Take 1 capsule (150 mg total) by mouth 2 (two) times daily. Patient not taking: Reported on 05/20/2015 04/25/15   Mackie Pai, PA-C  torsemide (DEMADEX) 20 MG tablet Take 1 tablet (20 mg total) by mouth daily. Patient taking differently: Take 20 mg by mouth daily as needed (swelling).  12/01/14   Midge Minium, MD   BP 114/91 mmHg  Pulse 116  Temp(Src) 98.3 F (36.8 C) (Oral)  Resp 18  Ht 5' 2"  (1.575 m)  Wt 368 lb 3 oz (167.009 kg)  BMI 67.33 kg/m2  SpO2 98%  LMP 05/06/2015 Physical Exam  Constitutional: She is oriented to person, place, and time. She appears well-developed.  Obese  HENT:  Head: Normocephalic and atraumatic.  Cardiovascular: Regular rhythm.   No murmur heard. Tachycardic  Pulmonary/Chest: Effort normal and breath sounds normal. No respiratory distress.  Abdominal: Soft. There is no rebound and no guarding.  Moderate diffuse abdominal tenderness, greatest over the epigastrium.  Musculoskeletal: She exhibits no edema or tenderness.  Neurological: She is alert and oriented to person, place, and time.  Skin: Skin is warm and dry.  Psychiatric: She has a normal mood and affect. Her behavior is normal.  Nursing note and vitals reviewed.   ED Course  Procedures (including critical care time) Labs Review Labs Reviewed  LIPASE, BLOOD - Abnormal; Notable for the following:    Lipase 11 (*)    All other components within normal limits  COMPREHENSIVE METABOLIC PANEL - Abnormal; Notable for the following:    BUN <5 (*)    Calcium 8.4 (*)    Albumin 2.5 (*)    All other components within normal limits  CBC - Abnormal;  Notable for the following:    RBC 5.51 (*)    Hemoglobin 10.2 (*)    HCT 33.8 (*)    MCV 61.3 (*)    MCH 18.5 (*)    RDW 21.5 (*)    Platelets 498 (*)    All other components within normal limits  URINE CULTURE  URINALYSIS, ROUTINE W REFLEX MICROSCOPIC (NOT AT [facility])    Imaging Review No results found. I have personally reviewed and evaluated these images and lab results as part of my medical decision-making.   EKG Interpretation None      MDM   Final diagnoses:  Generalized abdominal pain    Patient with history of diverticular colitis and colitis currently on antibiotics here for evaluation of progressive abdominal pain,  vomiting, diarrhea. She's had 2 CT scans in emergency department in the last week and a half that demonstrated continued colitis. She had a CT scan back in February that demonstrated diverticulitis. She has recurrent vomiting in the emergency department. Labs with no significant change from prior studies 9 days ago.  Discussed with Dr. Cristina Gong with gastroenterology regarding treatment and workup options. Given ongoing pain, vomiting, diarrhea plan to admit for further testing. Medicine consulted for admission.  Dr. Cristina Gong will see the patient in consult.    Discussed case with Dr. Dreama Saa who recommends CT abd to r/o abscess.    Quintella Reichert, MD 06/05/15 [phone]

## 2015-06-04 ENCOUNTER — Encounter (HOSPITAL_COMMUNITY): Payer: Self-pay | Admitting: Radiology

## 2015-06-04 ENCOUNTER — Emergency Department (HOSPITAL_COMMUNITY): Payer: Medicaid Other

## 2015-06-04 DIAGNOSIS — R34 Anuria and oliguria: Secondary | ICD-10-CM | POA: Diagnosis not present

## 2015-06-04 DIAGNOSIS — G9340 Encephalopathy, unspecified: Secondary | ICD-10-CM | POA: Diagnosis not present

## 2015-06-04 DIAGNOSIS — K5900 Constipation, unspecified: Secondary | ICD-10-CM | POA: Diagnosis not present

## 2015-06-04 DIAGNOSIS — K50918 Crohn's disease, unspecified, with other complication: Secondary | ICD-10-CM | POA: Diagnosis not present

## 2015-06-04 DIAGNOSIS — Z87891 Personal history of nicotine dependence: Secondary | ICD-10-CM | POA: Diagnosis not present

## 2015-06-04 DIAGNOSIS — K50114 Crohn's disease of large intestine with abscess: Secondary | ICD-10-CM | POA: Diagnosis present

## 2015-06-04 DIAGNOSIS — D62 Acute posthemorrhagic anemia: Secondary | ICD-10-CM | POA: Diagnosis not present

## 2015-06-04 DIAGNOSIS — N170 Acute kidney failure with tubular necrosis: Secondary | ICD-10-CM | POA: Diagnosis not present

## 2015-06-04 DIAGNOSIS — G4733 Obstructive sleep apnea (adult) (pediatric): Secondary | ICD-10-CM | POA: Diagnosis not present

## 2015-06-04 DIAGNOSIS — A419 Sepsis, unspecified organism: Secondary | ICD-10-CM | POA: Diagnosis not present

## 2015-06-04 DIAGNOSIS — J9601 Acute respiratory failure with hypoxia: Secondary | ICD-10-CM | POA: Diagnosis not present

## 2015-06-04 DIAGNOSIS — D509 Iron deficiency anemia, unspecified: Secondary | ICD-10-CM | POA: Diagnosis not present

## 2015-06-04 DIAGNOSIS — N289 Disorder of kidney and ureter, unspecified: Secondary | ICD-10-CM | POA: Diagnosis not present

## 2015-06-04 DIAGNOSIS — R1084 Generalized abdominal pain: Secondary | ICD-10-CM | POA: Insufficient documentation

## 2015-06-04 DIAGNOSIS — I471 Supraventricular tachycardia: Secondary | ICD-10-CM | POA: Diagnosis not present

## 2015-06-04 DIAGNOSIS — K5090 Crohn's disease, unspecified, without complications: Secondary | ICD-10-CM | POA: Diagnosis not present

## 2015-06-04 DIAGNOSIS — R5381 Other malaise: Secondary | ICD-10-CM | POA: Diagnosis not present

## 2015-06-04 DIAGNOSIS — E861 Hypovolemia: Secondary | ICD-10-CM | POA: Diagnosis not present

## 2015-06-04 DIAGNOSIS — K631 Perforation of intestine (nontraumatic): Secondary | ICD-10-CM | POA: Diagnosis not present

## 2015-06-04 DIAGNOSIS — J45909 Unspecified asthma, uncomplicated: Secondary | ICD-10-CM | POA: Diagnosis present

## 2015-06-04 DIAGNOSIS — E440 Moderate protein-calorie malnutrition: Secondary | ICD-10-CM | POA: Diagnosis present

## 2015-06-04 DIAGNOSIS — R739 Hyperglycemia, unspecified: Secondary | ICD-10-CM | POA: Diagnosis not present

## 2015-06-04 DIAGNOSIS — E43 Unspecified severe protein-calorie malnutrition: Secondary | ICD-10-CM | POA: Diagnosis present

## 2015-06-04 DIAGNOSIS — Z6841 Body Mass Index (BMI) 40.0 and over, adult: Secondary | ICD-10-CM | POA: Diagnosis not present

## 2015-06-04 DIAGNOSIS — I5021 Acute systolic (congestive) heart failure: Secondary | ICD-10-CM | POA: Diagnosis not present

## 2015-06-04 DIAGNOSIS — I272 Other secondary pulmonary hypertension: Secondary | ICD-10-CM | POA: Diagnosis present

## 2015-06-04 DIAGNOSIS — E871 Hypo-osmolality and hyponatremia: Secondary | ICD-10-CM | POA: Diagnosis not present

## 2015-06-04 DIAGNOSIS — K5010 Crohn's disease of large intestine without complications: Secondary | ICD-10-CM | POA: Diagnosis present

## 2015-06-04 DIAGNOSIS — N179 Acute kidney failure, unspecified: Secondary | ICD-10-CM | POA: Diagnosis not present

## 2015-06-04 DIAGNOSIS — E662 Morbid (severe) obesity with alveolar hypoventilation: Secondary | ICD-10-CM | POA: Diagnosis present

## 2015-06-04 DIAGNOSIS — K659 Peritonitis, unspecified: Secondary | ICD-10-CM | POA: Diagnosis not present

## 2015-06-04 DIAGNOSIS — J9600 Acute respiratory failure, unspecified whether with hypoxia or hypercapnia: Secondary | ICD-10-CM | POA: Diagnosis not present

## 2015-06-04 DIAGNOSIS — K50814 Crohn's disease of both small and large intestine with abscess: Secondary | ICD-10-CM | POA: Diagnosis not present

## 2015-06-04 DIAGNOSIS — K529 Noninfective gastroenteritis and colitis, unspecified: Secondary | ICD-10-CM | POA: Diagnosis not present

## 2015-06-04 DIAGNOSIS — K651 Peritoneal abscess: Secondary | ICD-10-CM | POA: Diagnosis not present

## 2015-06-04 DIAGNOSIS — I5022 Chronic systolic (congestive) heart failure: Secondary | ICD-10-CM | POA: Diagnosis present

## 2015-06-04 DIAGNOSIS — I429 Cardiomyopathy, unspecified: Secondary | ICD-10-CM | POA: Diagnosis not present

## 2015-06-04 LAB — PREGNANCY, URINE: Preg Test, Ur: NEGATIVE

## 2015-06-04 LAB — URINALYSIS, ROUTINE W REFLEX MICROSCOPIC
Glucose, UA: NEGATIVE mg/dL
Hgb urine dipstick: NEGATIVE
NITRITE: POSITIVE — AB
PH: 5.5 (ref 5.0–8.0)
Protein, ur: 30 mg/dL — AB
SPECIFIC GRAVITY, URINE: 1.025 (ref 1.005–1.030)
Urobilinogen, UA: 2 mg/dL — ABNORMAL HIGH (ref 0.0–1.0)

## 2015-06-04 LAB — URINE MICROSCOPIC-ADD ON

## 2015-06-04 MED ORDER — HYOSCYAMINE SULFATE 0.125 MG PO TBDP
0.1250 mg | ORAL_TABLET | Freq: Once | ORAL | Status: AC
  Administered 2015-06-04: 0.125 mg via ORAL
  Filled 2015-06-04: qty 1

## 2015-06-04 MED ORDER — FERROUS SULFATE 325 (65 FE) MG PO TABS
650.0000 mg | ORAL_TABLET | Freq: Every day | ORAL | Status: DC
  Administered 2015-06-05 – 2015-06-09 (×5): 650 mg via ORAL
  Filled 2015-06-04 (×6): qty 2

## 2015-06-04 MED ORDER — HYDROMORPHONE HCL 1 MG/ML IJ SOLN
1.0000 mg | Freq: Once | INTRAMUSCULAR | Status: AC
  Administered 2015-06-04: 1 mg via INTRAVENOUS
  Filled 2015-06-04: qty 1

## 2015-06-04 MED ORDER — ALBUTEROL SULFATE HFA 108 (90 BASE) MCG/ACT IN AERS: 2.0000 | INHALATION_SPRAY | Freq: Four times a day (QID) | RESPIRATORY_TRACT | Status: DC | PRN

## 2015-06-04 MED ORDER — ENOXAPARIN SODIUM 80 MG/0.8ML ~~LOC~~ SOLN
80.0000 mg | SUBCUTANEOUS | Status: DC
  Administered 2015-06-05 – 2015-06-09 (×5): 80 mg via SUBCUTANEOUS
  Filled 2015-06-04 (×7): qty 0.8

## 2015-06-04 MED ORDER — ONDANSETRON HCL 4 MG/2ML IJ SOLN
4.0000 mg | Freq: Once | INTRAMUSCULAR | Status: AC
  Administered 2015-06-04: 4 mg via INTRAVENOUS
  Filled 2015-06-04: qty 2

## 2015-06-04 MED ORDER — CHOLESTYRAMINE 4 G PO PACK
4.0000 g | PACK | Freq: Every day | ORAL | Status: DC
  Administered 2015-06-04 – 2015-06-09 (×4): 4 g via ORAL
  Filled 2015-06-04 (×8): qty 1

## 2015-06-04 MED ORDER — MORPHINE SULFATE (PF) 4 MG/ML IV SOLN
4.0000 mg | Freq: Once | INTRAVENOUS | Status: AC
  Administered 2015-06-04: 4 mg via INTRAVENOUS
  Filled 2015-06-04: qty 1

## 2015-06-04 MED ORDER — METRONIDAZOLE IN NACL 5-0.79 MG/ML-% IV SOLN
500.0000 mg | Freq: Three times a day (TID) | INTRAVENOUS | Status: DC
  Administered 2015-06-04 – 2015-06-07 (×9): 500 mg via INTRAVENOUS
  Filled 2015-06-04 (×12): qty 100

## 2015-06-04 MED ORDER — CEFTRIAXONE SODIUM 1 G IJ SOLR
1.0000 g | Freq: Once | INTRAMUSCULAR | Status: AC
  Administered 2015-06-04: 1 g via INTRAVENOUS
  Filled 2015-06-04: qty 10

## 2015-06-04 MED ORDER — ALBUTEROL SULFATE (2.5 MG/3ML) 0.083% IN NEBU: 2.5000 mg | INHALATION_SOLUTION | Freq: Four times a day (QID) | RESPIRATORY_TRACT | Status: DC | PRN

## 2015-06-04 MED ORDER — IOHEXOL 300 MG/ML  SOLN
100.0000 mL | Freq: Once | INTRAMUSCULAR | Status: AC | PRN
  Administered 2015-06-04: 100 mL via INTRAVENOUS

## 2015-06-04 MED ORDER — MORPHINE SULFATE (PF) 2 MG/ML IV SOLN
2.0000 mg | INTRAVENOUS | Status: DC | PRN
  Administered 2015-06-04 – 2015-06-07 (×12): 2 mg via INTRAVENOUS
  Filled 2015-06-04 (×13): qty 1

## 2015-06-04 MED ORDER — ENOXAPARIN SODIUM 40 MG/0.4ML ~~LOC~~ SOLN
40.0000 mg | SUBCUTANEOUS | Status: DC
  Administered 2015-06-04: 40 mg via SUBCUTANEOUS
  Filled 2015-06-04 (×2): qty 0.4

## 2015-06-04 MED ORDER — HYOSCYAMINE SULFATE 0.125 MG PO TABS
0.1250 mg | ORAL_TABLET | Freq: Once | ORAL | Status: DC
  Filled 2015-06-04: qty 1

## 2015-06-04 MED ORDER — DEXTROSE-NACL 5-0.45 % IV SOLN
INTRAVENOUS | Status: DC
  Administered 2015-06-04 – 2015-06-07 (×6): via INTRAVENOUS

## 2015-06-04 NOTE — ED Notes (Signed)
MD at bedside. 

## 2015-06-04 NOTE — Consult Note (Signed)
Referring Provider:  M. Mikhail, MD Primary Care Physician:  Benito Mccreedy, MD Primary Gastroenterologist:  Dr. Penelope Coop  Reason for Consultation:  Abdominal pain, colitis  HPI: Dominique [name] is a 34 y.o. female seen for the first time by my partner, Dr. Acquanetta Sit, just a few days ago.  Her problem is a several month history of progressive abdominal pain with alteration of bowel habit.  The patient first started having problems in February of this year, when she had abdominal pain and a CT scan at that time showed thickening and inflammation in the region of the distal descending colon and sigmoid colon. This was thought to represent diverticulitis, although the patient has no documented diverticulosis.  She did well until May, when she began again having abdominal pain in that area, on an intermittent basis. By July, the pain was occurring on almost a daily basis and for the past month or so, it has been almost continuous. It is not obviously ameliorated or exacerbated by food intake. However, food intake is sometimes associated with vomiting or diarrhea, so the patient thinks that she is eating less than she does normally.  The patient has been having diarrhea and small amounts whenever she urinates, typically about 4 or 5 times a day. This has been going on for months. Initially, she saw blood with this but has not noticed it recently. There is no hematuria.  The patient has recently been treated with Cipro and Flagyl. A C. difficile test has not yet been performed.  The patient has had a total of 4 CT scans in the past 6 months, including 3 in the past 2 weeks, all of which have demonstrated significant thickening of the descending colon and proximal sigmoid colon.   Past Medical History  Diagnosis Date  . Asthma   . Sleep apnea   . Anemia   . Morbid obesity 03/22/2009    Qualifier: Diagnosis of  By: Ronnald Ramp CMA, Chemira    . OSA (obstructive sleep apnea) 05/02/2014  . Thyromegaly  05/02/2014  . Seasonal allergies 06/13/2014  . Acute acalculous cholecystitis 11/16/2014  . Diverticulitis of colon 11/17/2014  . Hepatic steatosis 11/17/2014    Past Surgical History  Procedure Laterality Date  . C section 2011  2011    Prior to Admission medications   Medication Sig Start Date End Date Taking? Authorizing Provider  ferrous sulfate 325 (65 FE) MG tablet Take 2 tablets (650 mg total) by mouth daily with breakfast. 04/07/15  Yes Midge Minium, MD  HYDROcodone-acetaminophen (NORCO/VICODIN) 5-325 MG per tablet TAKE 1 TABLET BY MOUTH EVERY 6 HOURS AS NEEDED FOR PAIN 05/15/15  Yes Historical Provider, MD  metroNIDAZOLE (FLAGYL) 500 MG tablet Take 1 tablet (500 mg total) by mouth 3 (three) times daily. One po tid x 7 days 05/20/15  Yes Mercedes Camprubi-Soms, PA-C  ondansetron (ZOFRAN) 4 MG tablet Take 1 tablet (4 mg total) by mouth every 6 (six) hours. 05/25/15  Yes Heather Laisure, PA-C  albuterol (PROVENTIL) (2.5 MG/3ML) 0.083% nebulizer solution Take 3 mLs (2.5 mg total) by nebulization every 6 (six) hours as needed for wheezing or shortness of breath. Patient not taking: Reported on 06/03/2015 10/27/14   Rigoberto Noel, MD  pantoprazole (PROTONIX) 40 MG tablet Take 1 tablet (40 mg total) by mouth daily. Patient not taking: Reported on 05/20/2015 03/24/15   Kristen N Ward, DO  PROAIR HFA 108 (90 BASE) MCG/ACT inhaler INHALE 2 PUFFS BY MOUTH INTO THE LUNGS EVERY 6 HOURS AS NEEDED  FOR WHEEZING OR SHORTNESS OF BREATH 02/17/15   Midge Minium, MD  ranitidine (ZANTAC) 150 MG capsule Take 1 capsule (150 mg total) by mouth 2 (two) times daily. Patient not taking: Reported on 05/20/2015 04/25/15   Mackie Pai, PA-C  torsemide (DEMADEX) 20 MG tablet Take 1 tablet (20 mg total) by mouth daily. Patient not taking: Reported on 06/03/2015 12/01/14   Midge Minium, MD    Current Facility-Administered Medications  Medication Dose Route Frequency Provider Last Rate Last Dose  . albuterol  (PROVENTIL) (2.5 MG/3ML) 0.083% nebulizer solution 2.5 mg  2.5 mg Nebulization Q6H PRN Maryann Mikhail, DO      . cholestyramine (QUESTRAN) packet 4 g  4 g Oral QAC breakfast Maryann Mikhail, DO   4 g at 06/04/15 1246  . dextrose 5 %-0.45 % sodium chloride infusion   Intravenous Continuous Gennaro Africa, MD 125 mL/hr at 06/04/15 [id number]    . enoxaparin (LOVENOX) injection 40 mg  40 mg Subcutaneous Q24H Gennaro Africa, MD   40 mg at 06/04/15 1323  . [START ON 06/05/2015] ferrous sulfate tablet 650 mg  650 mg Oral Q breakfast Maryann Mikhail, DO      . metroNIDAZOLE (FLAGYL) IVPB 500 mg  500 mg Intravenous Q8H Maryann Mikhail, DO   500 mg at 06/04/15 1246  . morphine 2 MG/ML injection 2 mg  2 mg Intravenous Q3H PRN Gennaro Africa, MD   2 mg at 06/04/15 1323    Allergies as of 06/03/2015 - Review Complete 06/03/2015  Allergen Reaction Noted  . Other Shortness Of Breath and Swelling 11/11/2014  . Penicillins  03/22/2009    Family History  Problem Relation Age of Onset  . Hypertension Mother   . Hypertension Father   . Hypertension Sister   . Asthma Brother   . Diabetes Maternal Grandmother   . Hypertension Maternal Grandmother   . Hypertension Maternal Grandfather   . Cancer Paternal Grandmother     lung  . Emphysema Paternal Grandfather   . Allergies Mother     Social History   Social History  . Marital Status: Single    Spouse Name: N/A  . Number of Children: N/A  . Years of Education: N/A   Occupational History  . Not on file.   Social History Main Topics  . Smoking status: Former Smoker -- 0.50 packs/day for 4 years    Types: Cigarettes    Start date: 07/24/2014  . Smokeless tobacco: Never Used  . Alcohol Use: No  . Drug Use: No  . Sexual Activity: Not on file   Other Topics Concern  . Not on file   Social History Narrative    Review of Systems:  negative for skin rashes mouth sores or joint effusions to suggest inflammatory bowel disease. Negative for urinary symptoms or   Dyspnea.  Physical Exam: Vital signs in last 24 hours: Temp:  [98.3 F (36.8 C)] 98.3 F (36.8 C) (09/11 1500) Pulse Rate:  [72-130] 102 (09/11 1500) Resp:  [16-18] 16 (09/11 1500) BP: (103-140)/(60-95) 131/90 mmHg (09/11 1500) SpO2:  [93 %-99 %] 98 % (09/11 1500) Weight:  [167.009 kg (368 lb 3 oz)] 167.009 kg (368 lb 3 oz) (09/10 1807) Last BM Date: 06/03/15 General:   this is a morbidly obese African-American female lying in bed, no distress whatsoever. Her BMI at our office the other day was 68. Head:  Normocephalic and atraumatic. Eyes:  Sclera clear, no icterus.   Conjunctiva pink. Mouth:   No ulcerations  or lesions.  Oropharynx pink & moist. Lungs:  Lung sounds are difficult to assess due to decreased respiratory excursion and increased subcutaneous adiposity. No evident respiratory distress. Heart:   Regular rate and rhythm; no murmurs, clicks, rubs,  or gallops. Abdomen: I am not convinced I am hearing any bowel sounds. The abdomen is massively obese, soft, with mild to moderate subjective tenderness to moderate palpation, but no rigidity, no rebound tenderness.  Msk:   Symmetrical without gross deformities. Neurologic:  Alert and coherent;  grossly normal neurologically. Skin:  Intact without significant lesions or rashes. tattoo on right forearm. Psych:   Alert and cooperative. Normal mood and affect.  Intake/Output from previous day:   Intake/Output this shift: Total I/O In: 240 [P.O.:240] Out: -   Lab Results:  Recent Labs  06/03/15 1840  WBC 6.6  HGB 10.2*  HCT 33.8*  PLT 498*   BMET  Recent Labs  06/03/15 1840  NA 137  K 3.7  CL 102  CO2 24  GLUCOSE 84  BUN <5*  CREATININE 0.72  CALCIUM 8.4*   LFT  Recent Labs  06/03/15 1840  PROT 7.2  ALBUMIN 2.5*  AST 22  ALT 15  ALKPHOS 79  BILITOT 0.9   PT/INR No results for input(s): LABPROT, INR in the last 72 hours.  Studies/Results: Ct Abdomen Pelvis W Contrast  06/04/2015   CLINICAL DATA:   Nausea, vomiting, diarrhea, and fever for 3 weeks. Diffuse abdominal pain more in the right lower quadrant.  EXAM: CT ABDOMEN AND PELVIS WITH CONTRAST  TECHNIQUE: Multidetector CT imaging of the abdomen and pelvis was performed using the standard protocol following bolus administration of intravenous contrast.  CONTRAST:  132m OMNIPAQUE IOHEXOL 300 MG/ML  SOLN  COMPARISON:  05/25/2015 [facility] Lung bases are clear.  Diffuse fatty infiltration of the liver. Gallbladder, spleen, pancreas, adrenal glands, kidneys, abdominal aorta, inferior vena cava, and retroperitoneal lymph nodes are unremarkable. Stomach and small bowel are decompressed. Contrast material is present in the colon suggesting no evidence of small bowel obstruction. There is diffuse colonic wall thickening and diffuse infiltration in the pericolonic fat extending from the splenic flexure along the descending colon and to the sigmoid colon. This is consistent with colitis and could represent inflammatory or infectious etiologies. The amount of pericolonic infiltration is increasing since the previous study, vertically around the sigmoid region. No free air or free fluid in the abdomen. Abdominal wall musculature appears intact.  Pelvis: Bladder wall is not thickened. Uterus and ovaries are not enlarged. Appendix is normal. No pelvic mass or lymphadenopathy. No destructive bone lesions.  IMPRESSION: Wall thickening and prominent pericolonic infiltration around the descending and sigmoid colon consistent with regional colitis. Inflammatory reaction is increasing since previous study. No discrete abscess identified.   Electronically Signed   By: WLucienne CapersM.D.   On: 06/04/2015 04:35    Impression: Persistent left-sided colitis with associated diarrhea. I suspect chronic ischemia given the segmental distribution and absence of intestinal abnormalities elsewhere. I doubt this is infectious, given its chronicity, and I  doubt it reflects diverticulitis given the apparent absence of diverticular disease on CT and the persistence and distribution of the inflammatory findings.   Plan: Unprepped, minimally sedated attempt at colonoscopy tomorrow. The patient is aware that either the prep or her tolerance for discomfort may prevent [place] achieving visualization of the abnormal segment of colon. The nature, purpose, and risks of the procedure were reviewed with the patient  and she is agreeable. If necessary, we may need to redo the exam using of formal colonoscopy prep which would be a somewhat formidable undertaking in this morbidly obese patient, and/or we may need more definitive sedation with the help of the anesthesia department.  In the meantime, I would favor obtaining stool for C. difficile and possibly a GI pathogen panel. In view of her recent antibiotic therapy, empiric treatment with metronidazole would be reasonable in case C. difficile present.    LOS: 0 days   Ananias Kolander V  06/04/2015, 5:13 PM   Pager [phone] If no answer or after 5 PM call [phone]

## 2015-06-04 NOTE — H&P (Signed)
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 She said the pain started back in Jan and improved only between Feb and May. Since June she continued to have abdominal pain with diarrhea and occasional vomiting. She has not been able to see GI for insurance and referral issues until last week. She said after discussing her last CT abdomen findings with GI, the plan for her was to continue a course of abx and come back but last Friday she started having severe abdominal pain that got more diffuse ( usually she has lower abdominal pain) associated with vomiting daily for 2-3 times ( non bilious and non bloody) and daily diarrhea ( soft stool with occasional blood0. She denied fever or chills. No other complaints. No chest pain , cough or dyspnea.   Review of Systems:  CONSTITUTIONAL:  No night sweats.  No fatigue, malaise, lethargy.  No fever or chills. Eyes:  No visual changes.  No eye pain.  No eye discharge.   ENT:    No epistaxis.  No sinus pain.  No sore throat.  No ear pain.  No congestion. RESPIRATORY:  No cough.  No wheeze.  No hemoptysis.  No shortness of breath. CARDIOVASCULAR:  No chest pains.  No palpitations. GASTROINTESTINAL:  abdominal pain.  nausea or vomiting.  diarrhea .  No hematemesis.  No hematochezia.  No melena. GENITOURINARY:  No urgency.  No frequency.  No dysuria.  No hematuria.  No obstructive symptoms.  No discharge.  No pain.  No significant abnormal bleeding. MUSCULOSKELETAL:  No musculoskeletal pain.  No joint swelling.  No arthritis. NEUROLOGICAL:  No confusion.  No weakness. No headache. No seizure. PSYCHIATRIC:  No depression. No anxiety. No suicidal ideation. SKIN:  No rashes.  No  lesions.  No wounds. ENDOCRINE:  No unexplained weight loss.  No polydipsia.  No polyuria.  No polyphagia. HEMATOLOGIC:  No anemia.  No purpura.  No petechiae.  No bleeding.  ALLERGIC AND IMMUNOLOGIC:  No pruritus.  No swelling Other:  Past Medical and Surgical History:   Past Medical History  Diagnosis Date  . Asthma   . Sleep apnea   . Anemia   . Morbid obesity 03/22/2009    Qualifier: Diagnosis of  By: Ronnald Ramp CMA, Chemira    . OSA (obstructive sleep apnea) 05/02/2014  . Thyromegaly 05/02/2014  . Seasonal allergies 06/13/2014  . Acute acalculous cholecystitis 11/16/2014  . Diverticulitis of colon 11/17/2014  . Hepatic steatosis 11/17/2014   Past Surgical History  Procedure Laterality Date  . C section 2011  2011    Social History:   reports that she has quit smoking. Her smoking use included Cigarettes. She started smoking about 10 months ago. She has a 2 pack-year smoking history. She has never used smokeless tobacco. She reports that she does not drink alcohol or use illicit drugs.   Allergies  Allergen Reactions  . Other Shortness Of Breath and Swelling    Tree nuts  . Penicillins     Unknown childhood allergy    Family History  Problem Relation Age of Onset  . Hypertension Mother   . Hypertension Father   . Hypertension Sister   . Asthma Brother   . Diabetes Maternal  Grandmother   . Hypertension Maternal Grandmother   . Hypertension Maternal Grandfather   . Cancer Paternal Grandmother     lung  . Emphysema Paternal Grandfather   . Allergies Mother       Prior to Admission medications   Medication Sig Start Date End Date Taking? Authorizing Provider  ciprofloxacin (CIPRO) 500 MG tablet Take 1 tablet (500 mg total) by mouth 2 (two) times daily. One po bid x 7 days 05/20/15  Yes Mercedes Camprubi-Soms, PA-C  ferrous sulfate 325 (65 FE) MG tablet Take 2 tablets (650 mg total) by mouth daily with breakfast. 04/07/15  Yes Midge Minium, MD    HYDROcodone-acetaminophen (NORCO/VICODIN) 5-325 MG per tablet TAKE 1 TABLET BY MOUTH EVERY 6 HOURS AS NEEDED FOR PAIN 05/15/15  Yes Historical Provider, MD  metroNIDAZOLE (FLAGYL) 500 MG tablet Take 1 tablet (500 mg total) by mouth 3 (three) times daily. One po tid x 7 days 05/20/15  Yes Mercedes Camprubi-Soms, PA-C  ondansetron (ZOFRAN) 4 MG tablet Take 1 tablet (4 mg total) by mouth every 6 (six) hours. 05/25/15  Yes Heather Laisure, PA-C  albuterol (PROVENTIL) (2.5 MG/3ML) 0.083% nebulizer solution Take 3 mLs (2.5 mg total) by nebulization every 6 (six) hours as needed for wheezing or shortness of breath. Patient not taking: Reported on 06/03/2015 10/27/14   Rigoberto Noel, MD  beclomethasone (QVAR) 80 MCG/ACT inhaler Inhale 2 puffs into the lungs 2 (two) times daily. Patient not taking: Reported on 06/03/2015 05/02/14   Midge Minium, MD  cyclobenzaprine (FLEXERIL) 10 MG tablet Take 1 tablet (10 mg total) by mouth at bedtime. Patient not taking: Reported on 05/25/2015 04/07/15   Midge Minium, MD  pantoprazole (PROTONIX) 40 MG tablet Take 1 tablet (40 mg total) by mouth daily. Patient not taking: Reported on 05/20/2015 03/24/15   Kristen N Ward, DO  PROAIR HFA 108 (90 BASE) MCG/ACT inhaler INHALE 2 PUFFS BY MOUTH INTO THE LUNGS EVERY 6 HOURS AS NEEDED FOR WHEEZING OR SHORTNESS OF BREATH 02/17/15   Midge Minium, MD  ranitidine (ZANTAC) 150 MG capsule Take 1 capsule (150 mg total) by mouth 2 (two) times daily. Patient not taking: Reported on 05/20/2015 04/25/15   Mackie Pai, PA-C  torsemide (DEMADEX) 20 MG tablet Take 1 tablet (20 mg total) by mouth daily. Patient not taking: Reported on 06/03/2015 12/01/14   Midge Minium, MD    Physical Exam: BP 112/74 mmHg  Pulse 72  Temp(Src) 98.3 F (36.8 C) (Oral)  Resp 18  Ht 5' 2"  (1.575 m)  Wt 167.009 kg (368 lb 3 oz)  BMI 67.33 kg/m2  SpO2 95%  LMP 05/06/2015  GENERAL : Well developed, well nourished, alert and cooperative, and appears  to be in no acute distress. HEAD: normocephalic. EYES: PERRL, EOMI. Fundi normal, vision is grossly intact. EARS: External auditory canals and tympanic membranes clear, hearing grossly intact. NOSE: No nasal discharge. THROAT: Oral cavity and pharynx normal. No inflammation, swelling, exudate, or lesions.  NECK: Neck supple, non-tender. CARDIAC: Normal S1 and S2. No S3, S4 or murmurs. Rhythm is regular. There is no peripheral edema, cyanosis or pallor. Extremities are warm and well perfused. No carotid bruits. LUNGS: Clear to auscultation and percussion without rales, rhonchi, wheezing or diminished breath sounds. ABDOMEN: Positive bowel sounds. Soft, nondistended, diffuse lower abdomen ( below umbilicus) tenderness. + guarding . No rebound. No masses. EXTREMITIES: No significant deformity or joint abnormality. No edema. Peripheral pulses intact. No varicosities. NEUROLOGICAL: The mental examination  revealed the patient was oriented to person, place, and time.CN II-XII intact. Strength and sensation symmetric and intact throughout.Marland Kitchen SKIN: Skin normal color, texture and turgor with no lesions or eruptions. PSYCHIATRIC:  The patient was able to demonstrate good judgement and reason, without hallucinations, abnormal affect or abnormal behaviors during the examination. Patient is not suicidal.          Labs on Admission:  Reviewed.   Radiological Exams on Admission: Ct Abdomen Pelvis W Contrast  06/04/2015   CLINICAL DATA:  Nausea, vomiting, diarrhea, and fever for 3 weeks. Diffuse abdominal pain more in the right lower quadrant.  EXAM: CT ABDOMEN AND PELVIS WITH CONTRAST  TECHNIQUE: Multidetector CT imaging of the abdomen and pelvis was performed using the standard protocol following bolus administration of intravenous contrast.  CONTRAST:  143m OMNIPAQUE IOHEXOL 300 MG/ML  SOLN  COMPARISON:  [place] [facility] bases are clear.  Diffuse fatty infiltration of the  liver. Gallbladder, spleen, pancreas, adrenal glands, kidneys, abdominal aorta, inferior vena cava, and retroperitoneal lymph nodes are unremarkable. Stomach and small bowel are decompressed. Contrast material is present in the colon suggesting no evidence of small bowel obstruction. There is diffuse colonic wall thickening and diffuse infiltration in the pericolonic fat extending from the splenic flexure along the descending colon and to the sigmoid colon. This is consistent with colitis and could represent inflammatory or infectious etiologies. The amount of pericolonic infiltration is increasing since the previous study, vertically around the sigmoid region. No free air or free fluid in the abdomen. Abdominal wall musculature appears intact.  Pelvis: Bladder wall is not thickened. Uterus and ovaries are not enlarged. Appendix is normal. No pelvic mass or lymphadenopathy. No destructive bone lesions.  IMPRESSION: Wall thickening and prominent pericolonic infiltration around the descending and sigmoid colon consistent with regional colitis. Inflammatory reaction is increasing since previous study. No discrete abscess identified.   Electronically Signed   By: WLucienne CapersM.D.   On: 06/04/2015 04:35      Assessment/Plan  Acute /recurrent colitis:  Will continue Zosyn IV, Keep NPO, IVF, morphine prn, PPI IV, consult to GI in am.  CT abdomen continues to show colitis w/o abscess.   Asthma: continue home meds, stable.   DVT prophylaxis: [place] enoxparin GI prophylaxis: PPI IV Consultants: GI in am Code Status: Full  Family Communication: None at bedside Disposition Plan: awaiting clinical improvement.     AGennaro AfricaM.D [facility]

## 2015-06-04 NOTE — Progress Notes (Signed)
[facility]                                                                              Patient Demographics  Dominique [name], is a 34 y.o. female, DOB - Nov 18, 1980, ZDG:[id number]  Admit date - 06/03/2015   Admitting Physician Gennaro Africa, MD  Outpatient Primary MD for the patient is OSEI-BONSU,GEORGE, MD  LOS - 0   Chief Complaint  Patient presents with  . Abdominal Pain      HPI on 06/04/2015 by Dr. 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 She said the pain started back in Jan and improved only between Feb and May. Since June she continued to have abdominal pain with diarrhea and occasional vomiting. She has not been able to see GI for insurance and referral issues until last week. She said after discussing her last CT abdomen findings with GI, the plan for her was to continue a course of abx and come back but last Friday she started having severe abdominal pain that got more diffuse ( usually she has lower abdominal pain) associated with vomiting daily for 2-3 times ( non bilious and non bloody) and daily diarrhea ( soft stool with occasional blood0. She denied fever or chills. No other complaints. No chest pain , cough or dyspnea.   Assessment & Plan   Acute recurrent colitis/chronic diarrhea -CT of the abdomen showed regional colitis. -Patient was recently placed on Flagyl as well as ciprofloxacin by gastroenterology as an outpatient. -GI pathogen panel and C. difficile pending -Will place patient on Flagyl -Will start cholestyramine to see if this helps with diarrhea -Spoke with Dr. Cristina Gong, who plans to do flexible sigmoidoscopy on 06/05/2015.  Asthma -Currently controlled. Continue inhalers as needed.  Chronic microcytic anemia -Hemoglobin currently 10.2, baseline appears to be between 7-8 -Continue to monitor CBC -Continue ferrous sulfate  Asymptomatic  bacteriuria -UA showed 3-6 WBC, small leukocytes, positive nitrites, few bacteria -Patient currently denies any urinary symptoms, burning or odor -Will not treat at this time  Morbid obesity -Patient will need to discuss last modifications including diet and exercise with her primary care physician upon discharge  Code Status: Full  Family Communication: None at bedside  Disposition Plan: Admitted  Time Spent in minutes   30 minutes  Procedures  None  Consults   Gastroenterology  DVT Prophylaxis  Lovenox  Lab Results  Component Value Date   PLT 498* 06/03/2015    Medications  Scheduled Meds: . cholestyramine  4 g Oral QAC breakfast  . enoxaparin (LOVENOX) injection  40 mg Subcutaneous Q24H  . metronidazole  500 mg Intravenous Q8H   Continuous Infusions: . dextrose 5 % and 0.45% NaCl 125 mL/hr at 06/04/15 0824   PRN Meds:.morphine injection  Antibiotics    Anti-infectives    Start     Dose/Rate Route Frequency Ordered Stop   06/04/15 1200  metroNIDAZOLE (FLAGYL) IVPB 500 mg     500 mg 100 mL/hr over 60 Minutes Intravenous Every 8 hours 06/04/15 1034     06/04/15 0015  cefTRIAXone (  ROCEPHIN) 1 g in dextrose 5 % 50 mL IVPB     1 g 100 mL/hr over 30 Minutes Intravenous  Once 06/04/15 0012 06/04/15 0108        Subjective:   Dominique [name] seen and examined today. Patient states she has some nausea and some abdominal pain. Has had diarrhea since June. Patient was recently start on ciprofloxacin and Flagyl. Patient denies any change in her diet or recent illness currently denies chest pain or shortness of breath, dizziness or headache.  Objective:   Filed Vitals:   06/04/15 0530 06/04/15 0551 06/04/15 0600 06/04/15 0656  BP: 112/74 112/74 120/86 124/87  Pulse: 105 72 111 121  Temp:    98.3 F (36.8 C)  TempSrc:    Oral  Resp:  18  18  Height:      Weight:      SpO2: 96% 95% 95% 95%    Wt Readings from Last 3 Encounters:  06/03/15 167.009 kg (368 lb 3  oz)  05/25/15 172.367 kg (380 lb)  04/25/15 185.975 kg (410 lb)     Intake/Output Summary (Last 24 hours) at 06/04/15 1059 Last data filed at 06/04/15 0730  Gross per 24 hour  Intake      0 ml  Output      0 ml  Net      0 ml    Exam  General: Well developed, well nourished, NAD, appears stated age  HEENT: NCAT,  mucous membranes moist.   Cardiovascular: S1 S2 auscultated, no rubs, murmurs or gallops. Regular rate and rhythm.  Respiratory: Clear to auscultation bilaterally with equal chest rise  Abdomen: Soft, obese, Mildly TTP LUQ, nondistended, + bowel sounds, no rebound or guarding  Extremities: warm dry without cyanosis clubbing or edema  Neuro: AAOx3, nonfocal  Data Review   Micro Results No results found for this or any previous visit (from the past 240 hour(s)).  Radiology Reports Ct Abdomen Pelvis W Contrast  06/04/2015   CLINICAL DATA:  Nausea, vomiting, diarrhea, and fever for 3 weeks. Diffuse abdominal pain more in the right lower quadrant.  EXAM: CT ABDOMEN AND PELVIS WITH CONTRAST  TECHNIQUE: Multidetector CT imaging of the abdomen and pelvis was performed using the standard protocol following bolus administration of intravenous contrast.  CONTRAST:  174m OMNIPAQUE IOHEXOL 300 MG/ML  SOLN  COMPARISON:  05/25/2015 [place] [facility] Lung bases are clear.  Diffuse fatty infiltration of the liver. Gallbladder, spleen, pancreas, adrenal glands, kidneys, abdominal aorta, inferior vena cava, and retroperitoneal lymph nodes are unremarkable. Stomach and small bowel are decompressed. Contrast material is present in the colon suggesting no evidence of small bowel obstruction. There is diffuse colonic wall thickening and diffuse infiltration in the pericolonic fat extending from the splenic flexure along the descending colon and to the sigmoid colon. This is consistent with colitis and could represent inflammatory or infectious etiologies. The amount of  pericolonic infiltration is increasing since the previous study, vertically around the sigmoid region. No free air or free fluid in the abdomen. Abdominal wall musculature appears intact.  Pelvis: Bladder wall is not thickened. Uterus and ovaries are not enlarged. Appendix is normal. No pelvic mass or lymphadenopathy. No destructive bone lesions.  IMPRESSION: Wall thickening and prominent pericolonic infiltration around the descending and sigmoid colon consistent with regional colitis. Inflammatory reaction is increasing since previous study. No discrete abscess identified.   Electronically Signed   By: WLucienne CapersM.D.   On: 06/04/2015 04:35  Ct Abdomen Pelvis W Contrast  05/25/2015   CLINICAL DATA:  Abdominal pain radiating to the left side for several months. Nausea and vomiting beginning in July.  EXAM: CT ABDOMEN AND PELVIS WITH CONTRAST  TECHNIQUE: Multidetector CT imaging of the abdomen and pelvis was performed using the standard protocol following bolus administration of intravenous contrast.  CONTRAST:  100 mL OMNIPAQUE IOHEXOL 300 MG/ML  SOLN  COMPARISON:  CT abdomen and pelvis 05/20/2015 and 11/17/2014.  FINDINGS: The lung bases are clear. No pleural or pericardial effusion. Heart size is mildly enlarged.  As seen on the prior examination, there is wall thickening with surrounding stranding of the descending colon to the sigmoid. The appearance is not markedly changed. Prominent stool burden in the ascending and transverse colon is noted. The appendix is unremarkable. The stomach and small bowel appear normal.  There is fatty infiltration of the liver. The spleen, adrenal glands, pancreas and kidneys appear normal. No lymphadenopathy or fluid is identified. Uterus, adnexa and urinary bladder are unremarkable.  Image bones are unremarkable.  IMPRESSION: Findings consistent with colitis of the descending colon, not markedly changed compare to the patient's recent CT abdomen and pelvis. No new  abnormality.  Fatty infiltration of the liver.  Cardiomegaly.   Electronically Signed   By: Inge Rise M.D.   On: 05/25/2015 16:13   Ct Abdomen Pelvis W Contrast  05/20/2015   CLINICAL DATA:  Left lower quadrant abdominal pain. History of diverticulitis.  EXAM: CT ABDOMEN AND PELVIS WITH CONTRAST  TECHNIQUE: Multidetector CT imaging of the abdomen and pelvis was performed using the standard protocol following bolus administration of intravenous contrast.  CONTRAST:  147m OMNIPAQUE IOHEXOL 300 MG/ML SOLN, 593mOMNIPAQUE IOHEXOL 300 MG/ML SOLN  COMPARISON:  None.  FINDINGS: Lung bases are clear. No effusions. Heart is normal size.  Mild diffuse fatty infiltration of the liver. No focal abnormality. Gallbladder, spleen, pancreas, adrenals and kidneys are unremarkable.  There is marked wall thickening and surrounding inflammatory changes around the entire descending colon and proximal sigmoid colon. Findings most compatible with colitis. No fluid collection. No free air. Small bowel and stomach are unremarkable. Moderate stool burden throughout the colon.  Uterus, adnexae and urinary bladder are unremarkable. No free fluid, free air or adenopathy. There are mildly prominent pericolonic lymph nodes adjacent to the rectum and sigmoid colon, presumably reactive. Aorta is normal caliber.  No acute bony abnormality or focal bone lesion.  IMPRESSION: Marked colonic wall thickening and surrounding inflammatory change involving the entire descending colon and proximal sigmoid colon compatible with colitis.  Moderate stool burden throughout the colon.  Fatty liver.   Electronically Signed   By: KeRolm Baptise.D.   On: 05/20/2015 19:07    CBC  Recent Labs Lab 06/03/15 1840  WBC 6.6  HGB 10.2*  HCT 33.8*  PLT 498*  MCV 61.3*  MCH 18.5*  MCHC 30.2  RDW 21.5*    Chemistries   Recent Labs Lab 06/03/15 1840  NA 137  K 3.7  CL 102  CO2 24  GLUCOSE 84  BUN <5*  CREATININE 0.72  CALCIUM 8.4*  AST  22  ALT 15  ALKPHOS 79  BILITOT 0.9   ------------------------------------------------------------------------------------------------------------------ estimated creatinine clearance is 151.6 mL/min (by C-G formula based on Cr of 0.72). ------------------------------------------------------------------------------------------------------------------ No results for input(s): HGBA1C in the last 72 hours. ------------------------------------------------------------------------------------------------------------------ No results for input(s): CHOL, HDL, LDLCALC, TRIG, CHOLHDL, LDLDIRECT in the last 72 hours. ------------------------------------------------------------------------------------------------------------------ No results for input(s): TSH, T4TOTAL, T3FREE,  THYROIDAB in the last 72 hours.  Invalid input(s): FREET3 ------------------------------------------------------------------------------------------------------------------ No results for input(s): VITAMINB12, FOLATE, FERRITIN, TIBC, IRON, RETICCTPCT in the last 72 hours.  Coagulation profile No results for input(s): INR, PROTIME in the last 168 hours.  No results for input(s): DDIMER in the last 72 hours.  Cardiac Enzymes No results for input(s): CKMB, TROPONINI, MYOGLOBIN in the last 168 hours.  Invalid input(s): CK ------------------------------------------------------------------------------------------------------------------ Invalid input(s): POCBNP    Emrys Mckamie D.O. on 06/04/2015 at 10:59 AM  Between 7am to 7pm - Pager - [phone]  After 7pm go to [email or web address] - password [email or web address]  And look for the night coverage person covering for me after hours  [facility] Office  [phone]

## 2015-06-04 NOTE — Progress Notes (Signed)
Utilization review completed.  

## 2015-06-05 ENCOUNTER — Encounter (HOSPITAL_COMMUNITY): Payer: Self-pay

## 2015-06-05 ENCOUNTER — Encounter (HOSPITAL_COMMUNITY)
Admission: EM | Disposition: A | Discharge status: Rehabilitation Facility | Payer: Self-pay | Source: Home / Self Care | Attending: Emergency Medicine

## 2015-06-05 DIAGNOSIS — R000 Tachycardia, unspecified: Secondary | ICD-10-CM

## 2015-06-05 HISTORY — PX: FLEXIBLE SIGMOIDOSCOPY: SHX5431

## 2015-06-05 LAB — BASIC METABOLIC PANEL
Anion gap: 7 (ref 5–15)
BUN: 7 mg/dL (ref 6–20)
CALCIUM: 8 mg/dL — AB (ref 8.9–10.3)
CO2: 26 mmol/L (ref 22–32)
CREATININE: 0.65 mg/dL (ref 0.44–1.00)
Chloride: 102 mmol/L (ref 101–111)
GFR calc non Af Amer: >60 mL/min (ref >60)
Glucose, Bld: 98 mg/dL (ref 65–99)
Potassium: 3.4 mmol/L — ABNORMAL LOW (ref 3.5–5.1)
SODIUM: 135 mmol/L (ref 135–145)

## 2015-06-05 LAB — URINE CULTURE: CULTURE: NO GROWTH

## 2015-06-05 LAB — CBC
HCT: 31 % — ABNORMAL LOW (ref 36.0–46.0)
Hemoglobin: 9.6 g/dL — ABNORMAL LOW (ref 12.0–15.0)
MCH: 18.8 pg — AB (ref 26.0–34.0)
MCHC: 31 g/dL (ref 30.0–36.0)
MCV: 60.7 fL — ABNORMAL LOW (ref 78.0–100.0)
PLATELETS: 388 10*3/uL (ref 150–400)
RBC: 5.11 MIL/uL (ref 3.87–5.11)
RDW: 21.5 % — AB (ref 11.5–15.5)
WBC: 7.6 10*3/uL (ref 4.0–10.5)

## 2015-06-05 LAB — TSH: TSH: 1.714 u[IU]/mL (ref 0.350–4.500)

## 2015-06-05 SURGERY — SIGMOIDOSCOPY, FLEXIBLE
Anesthesia: Moderate Sedation

## 2015-06-05 MED ORDER — MIDAZOLAM HCL 5 MG/5ML IJ SOLN
INTRAMUSCULAR | Status: DC | PRN
  Administered 2015-06-05 (×3): 1 mg via INTRAVENOUS

## 2015-06-05 MED ORDER — WHITE PETROLATUM GEL
Status: AC
  Administered 2015-06-05: 1
  Filled 2015-06-05: qty 1

## 2015-06-05 MED ORDER — MIDAZOLAM HCL 5 MG/ML IJ SOLN
INTRAMUSCULAR | Status: AC
  Filled 2015-06-05: qty 1

## 2015-06-05 MED ORDER — SODIUM CHLORIDE 0.9 % IV SOLN
INTRAVENOUS | Status: DC
  Administered 2015-06-05: 11:00:00 via INTRAVENOUS

## 2015-06-05 MED ORDER — FENTANYL CITRATE (PF) 100 MCG/2ML IJ SOLN
INTRAMUSCULAR | Status: AC
  Filled 2015-06-05: qty 2

## 2015-06-05 NOTE — Progress Notes (Signed)
Patient's sigmoidoscopy showed changes correlating with the radiographic findings in the proximal sigmoid and descending colon, namely, severe chronic inflammatory change characterized by marked edema and nodularity.  Please see dictated note for details.  Hopefully, we will have pathology results by tomorrow, which may help to guide the patient's management.  Cleotis Nipper, M.D. Pager [phone] If no answer or after 5 PM call [phone]

## 2015-06-05 NOTE — Progress Notes (Signed)
[facility]                                                                              Patient Demographics  Dominique [name], is a 34 y.o. female, DOB - 07/25/1981, QPY:[id number]  Admit date - 06/03/2015   Admitting Physician Gennaro Africa, MD  Outpatient Primary MD for the patient is OSEI-BONSU,GEORGE, MD  LOS - 1   Chief Complaint  Patient presents with  . Abdominal Pain      HPI on 06/04/2015 by Dr. 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 She said the pain started back in Jan and improved only between Feb and May. Since June she continued to have abdominal pain with diarrhea and occasional vomiting. She has not been able to see GI for insurance and referral issues until last week. She said after discussing her last CT abdomen findings with GI, the plan for her was to continue a course of abx and come back but last Friday she started having severe abdominal pain that got more diffuse ( usually she has lower abdominal pain) associated with vomiting daily for 2-3 times ( non bilious and non bloody) and daily diarrhea ( soft stool with occasional blood0. She denied fever or chills. No other complaints. No chest pain , cough or dyspnea.   Assessment & Plan   Acute recurrent colitis/chronic diarrhea -CT of the abdomen showed regional colitis. -Patient was recently placed on Flagyl as well as ciprofloxacin by gastroenterology as an outpatient. -GI pathogen panel and C. difficile pending -Continue gentle and cholestyramine -Gastroenterology consulted and appreciated, plans for flexible sigmoidoscopy today  Asthma -Currently controlled. Continue inhalers as needed.  Chronic microcytic anemia -Hemoglobin currently 10.2, baseline appears to be between 7-8 -Continue to monitor CBC -Continue ferrous sulfate  Asymptomatic bacteriuria -UA showed 3-6 WBC, small leukocytes,  positive nitrites, few bacteria -Patient currently denies any urinary symptoms, burning or odor -Will not treat at this time  Morbid obesity -Patient will need to discuss last modifications including diet and exercise with her primary care physician upon discharge  Tachycardia  -Possibly secondary to pain, will obtain TSH  Code Status: Full  Family Communication: None at bedside  Disposition Plan: Admitted. Pending flexible sigmoidoscopy today and further recommendations from gastroenterology.  Time Spent in minutes   30 minutes  Procedures  None  Consults   Gastroenterology  DVT Prophylaxis  Lovenox  Lab Results  Component Value Date   PLT 498* 06/03/2015    Medications  Scheduled Meds: . cholestyramine  4 g Oral QAC breakfast  . enoxaparin (LOVENOX) injection  80 mg Subcutaneous Q24H  . ferrous sulfate  650 mg Oral Q breakfast  . metronidazole  500 mg Intravenous Q8H   Continuous Infusions: . sodium chloride 20 mL/hr at 06/05/15 1052  . dextrose 5 % and 0.45% NaCl 125 mL/hr at 06/05/15 0231   PRN Meds:.albuterol, morphine injection  Antibiotics    Anti-infectives    Start     Dose/Rate Route Frequency Ordered Stop   06/04/15 1200  metroNIDAZOLE (FLAGYL) IVPB 500 mg  500 mg 100 mL/hr over 60 Minutes Intravenous Every 8 hours 06/04/15 1034     06/04/15 0015  cefTRIAXone (ROCEPHIN) 1 g in dextrose 5 % 50 mL IVPB     1 g 100 mL/hr over 30 Minutes Intravenous  Once 06/04/15 0012 06/04/15 0108        Subjective:   [name] seen and examined today. Patient continues to complain of abdominal pain. She states she had one episode of diarrhea yesterday however has not had one since. Denies any issues at home or any stressors in her life at this time. Denies chest pain or shortness of breath, dizziness or headache.  Objective:   Filed Vitals:   06/04/15 1500 06/04/15 2154 06/05/15 0604 06/05/15 1048  BP: 131/90 126/62 135/83 131/77  Pulse: 102 117 120  116  Temp: 98.3 F (36.8 C) 98.9 F (37.2 C) 98.8 F (37.1 C) 98 F (36.7 C)  TempSrc:  Oral Oral Oral  Resp: 16 16 16 12   Height:      Weight:      SpO2: 98% 97% 98% 96%    Wt Readings from Last 3 Encounters:  06/03/15 167.009 kg (368 lb 3 oz)  05/25/15 172.367 kg (380 lb)  04/25/15 185.975 kg (410 lb)     Intake/Output Summary (Last 24 hours) at 06/05/15 1058 Last data filed at 06/04/15 1300  Gross per 24 hour  Intake    240 ml  Output      0 ml  Net    240 ml    Exam  General: Well developed, well nourished, NAD, resting comfortably  HEENT: NCAT,  mucous membranes moist.   Cardiovascular: S1 S2 auscultated, tachycardic, no murmurs.  Respiratory: Clear to auscultation   Abdomen: Soft, obese, Mildly TTP, nondistended, + bowel sounds, no rebound or guarding  Extremities: warm dry without cyanosis clubbing or edema  Neuro: AAOx3, nonfocal  Data Review   Micro Results No results found for this or any previous visit (from the past 240 hour(s)).  Radiology Reports Ct Abdomen Pelvis W Contrast  06/04/2015   CLINICAL DATA:  Nausea, vomiting, diarrhea, and fever for 3 weeks. Diffuse abdominal pain more in the right lower quadrant.  EXAM: CT ABDOMEN AND PELVIS WITH CONTRAST  TECHNIQUE: Multidetector CT imaging of the abdomen and pelvis was performed using the standard protocol following bolus administration of intravenous contrast.  CONTRAST:  171m OMNIPAQUE IOHEXOL 300 MG/ML  SOLN  COMPARISON:  05/25/2015 from [facility] Lung bases are clear.  Diffuse fatty infiltration of the liver. Gallbladder, spleen, pancreas, adrenal glands, kidneys, abdominal aorta, inferior vena cava, and retroperitoneal lymph nodes are unremarkable. Stomach and small bowel are decompressed. Contrast material is present in the colon suggesting no evidence of small bowel obstruction. There is diffuse colonic wall thickening and diffuse infiltration in the pericolonic fat  extending from the splenic flexure along the descending colon and to the sigmoid colon. This is consistent with colitis and could represent inflammatory or infectious etiologies. The amount of pericolonic infiltration is increasing since the previous study, vertically around the sigmoid region. No free air or free fluid in the abdomen. Abdominal wall musculature appears intact.  Pelvis: Bladder wall is not thickened. Uterus and ovaries are not enlarged. Appendix is normal. No pelvic mass or lymphadenopathy. No destructive bone lesions.  IMPRESSION: Wall thickening and prominent pericolonic infiltration around the descending and sigmoid colon consistent with regional colitis. Inflammatory reaction is increasing since previous study. No discrete abscess identified.  Electronically Signed   By: Lucienne Capers M.D.   On: 06/04/2015 04:35   Ct Abdomen Pelvis W Contrast  05/25/2015   CLINICAL DATA:  Abdominal pain radiating to the left side for several months. Nausea and vomiting beginning in July.  EXAM: CT ABDOMEN AND PELVIS WITH CONTRAST  TECHNIQUE: Multidetector CT imaging of the abdomen and pelvis was performed using the standard protocol following bolus administration of intravenous contrast.  CONTRAST:  100 mL OMNIPAQUE IOHEXOL 300 MG/ML  SOLN  COMPARISON:  CT abdomen and pelvis 05/20/2015 and 11/17/2014.  FINDINGS: The lung bases are clear. No pleural or pericardial effusion. Heart size is mildly enlarged.  As seen on the prior examination, there is wall thickening with surrounding stranding of the descending colon to the sigmoid. The appearance is not markedly changed. Prominent stool burden in the ascending and transverse colon is noted. The appendix is unremarkable. The stomach and small bowel appear normal.  There is fatty infiltration of the liver. The spleen, adrenal glands, pancreas and kidneys appear normal. No lymphadenopathy or fluid is identified. Uterus, adnexa and urinary bladder are  unremarkable.  Image bones are unremarkable.  IMPRESSION: Findings consistent with colitis of the descending colon, not markedly changed compare to the patient's recent CT abdomen and pelvis. No new abnormality.  Fatty infiltration of the liver.  Cardiomegaly.   Electronically Signed   By: Inge Rise M.D.   On: 05/25/2015 16:13   Ct Abdomen Pelvis W Contrast  05/20/2015   CLINICAL DATA:  Left lower quadrant abdominal pain. History of diverticulitis.  EXAM: CT ABDOMEN AND PELVIS WITH CONTRAST  TECHNIQUE: Multidetector CT imaging of the abdomen and pelvis was performed using the standard protocol following bolus administration of intravenous contrast.  CONTRAST:  151m OMNIPAQUE IOHEXOL 300 MG/ML SOLN, 563mOMNIPAQUE IOHEXOL 300 MG/ML SOLN  COMPARISON:  None.  FINDINGS: Lung bases are clear. No effusions. Heart is normal size.  Mild diffuse fatty infiltration of the liver. No focal abnormality. Gallbladder, spleen, pancreas, adrenals and kidneys are unremarkable.  There is marked wall thickening and surrounding inflammatory changes around the entire descending colon and proximal sigmoid colon. Findings most compatible with colitis. No fluid collection. No free air. Small bowel and stomach are unremarkable. Moderate stool burden throughout the colon.  Uterus, adnexae and urinary bladder are unremarkable. No free fluid, free air or adenopathy. There are mildly prominent pericolonic lymph nodes adjacent to the rectum and sigmoid colon, presumably reactive. Aorta is normal caliber.  No acute bony abnormality or focal bone lesion.  IMPRESSION: Marked colonic wall thickening and surrounding inflammatory change involving the entire descending colon and proximal sigmoid colon compatible with colitis.  Moderate stool burden throughout the colon.  Fatty liver.   Electronically Signed   By: KeRolm Baptise.D.   On: 05/20/2015 19:07    CBC  Recent Labs Lab 06/03/15 1840  WBC 6.6  HGB 10.2*  HCT 33.8*  PLT 498*    MCV 61.3*  MCH 18.5*  MCHC 30.2  RDW 21.5*    Chemistries   Recent Labs Lab 06/03/15 1840  NA 137  K 3.7  CL 102  CO2 24  GLUCOSE 84  BUN <5*  CREATININE 0.72  CALCIUM 8.4*  AST 22  ALT 15  ALKPHOS 79  BILITOT 0.9   ------------------------------------------------------------------------------------------------------------------ estimated creatinine clearance is 151.6 mL/min (by C-G formula based on Cr of 0.72). ------------------------------------------------------------------------------------------------------------------ No results for input(s): HGBA1C in the last 72 hours. ------------------------------------------------------------------------------------------------------------------ No results for input(s): CHOL, HDL,  LDLCALC, TRIG, CHOLHDL, LDLDIRECT in the last 72 hours. ------------------------------------------------------------------------------------------------------------------ No results for input(s): TSH, T4TOTAL, T3FREE, THYROIDAB in the last 72 hours.  Invalid input(s): FREET3 ------------------------------------------------------------------------------------------------------------------ No results for input(s): VITAMINB12, FOLATE, FERRITIN, TIBC, IRON, RETICCTPCT in the last 72 hours.  Coagulation profile No results for input(s): INR, PROTIME in the last 168 hours.  No results for input(s): DDIMER in the last 72 hours.  Cardiac Enzymes No results for input(s): CKMB, TROPONINI, MYOGLOBIN in the last 168 hours.  Invalid input(s): CK ------------------------------------------------------------------------------------------------------------------ Invalid input(s): POCBNP    Athel Merriweather D.O. on 06/05/2015 at 10:58 AM  Between 7am to 7pm - Pager - [phone]  After 7pm go to [email or web address] - password [email or web address]  And look for the night coverage person covering for me after hours  [facility] Office  [phone]

## 2015-06-05 NOTE — Op Note (Signed)
[facility] [place], [address]   FLEXIBLE SIGMOIDOSCOPY PROCEDURE REPORT  PATIENT: Dominique, [name]  MR#: [id number] BIRTHDATE: Jan 11, 1981 , 33  yrs. old GENDER: female ENDOSCOPIST: Ronald Lobo, MD REFERRED BY:  Dr. Vista Lawman PROCEDURE DATE:  06/05/2015 PROCEDURE:   flexible sigmoidoscopy with biopsy ASA CLASS:   IV INDICATIONS: persistent left-sided abdominal pain, small-volume diarrhea, and marked bowel wall thickening of the descending colon and proximal sigmoid on multiple CT scans over the past 5 months MEDICATIONS: Versed 3 mg IV  DESCRIPTION OF PROCEDURE:   After the risks benefits and alternatives of the procedure were thoroughly explained, informed consent was obtained. The patient was brought from her hospital room to the [facility] cone endoscopy unit. This exam was done unprepped.  Perianal exam was negative for any obvious disease such as fistulae, abscess, or prolapsed hemorrhoids. Digital exam showed a normal anal canal, without nodularity or stenosis.  The Pentax Ped Colon [id number]  endoscope was introduced through the anus  and advanced to the mid descending colon, approximately 60 cm of insertion      , The exam was Without limitations.    The quality of the prep was excellent, even though no prep had been administered      . Estimated blood loss is zero unless otherwise noted in this procedure report. The instrument was then slowly withdrawn as the mucosa was fully examined.       The rectum and distal sigmoid looked normal.  However, beginning at approximately 25 or 30 cm from the external anal opening, there was confluent marked abnormality of the colon, characterized by severe edema, mucosal erythema, and bulky nodularity. There was no discrete mass. There was a fairly large amount of mucinous exudate, but when this was irrigated away, I did not see any underlying mucosal ulceration.  Multiple biopsies were  obtained from this area prior to removal of the scope.  Retroflexion in the rectum was unremarkable.         The scope was then withdrawn from the patient and the procedure terminated.  COMPLICATIONS: There were no immediate complications.  ENDOSCOPIC IMPRESSION: Severe chronic inflammatory changes in the proximal sigmoid and descending colon, characterized by edema, nodularity, and mucinous exudate. Normal rectum and distal sigmoid. The changes are very nonspecific in their appearance, and do not look like typical Crohn's disease, ulcerative colitis, or ischemia.  RECOMMENDATIONS: Await pathology results. In the meantime, symptomatic management with pain medication as needed.  REPEAT EXAM: to be determined  eSigned:  Ronald Lobo, MD 06/05/2015 12:13 PM   CC:  PATIENT NAME:  Dominique, [name] MR#: [id number]

## 2015-06-06 DIAGNOSIS — K50119 Crohn's disease of large intestine with unspecified complications: Secondary | ICD-10-CM

## 2015-06-06 LAB — BASIC METABOLIC PANEL
ANION GAP: 6 (ref 5–15)
BUN: 6 mg/dL (ref 6–20)
CALCIUM: 7.7 mg/dL — AB (ref 8.9–10.3)
CO2: 27 mmol/L (ref 22–32)
Chloride: 100 mmol/L — ABNORMAL LOW (ref 101–111)
Creatinine, Ser: 0.57 mg/dL (ref 0.44–1.00)
GLUCOSE: 103 mg/dL — AB (ref 65–99)
Potassium: 3.2 mmol/L — ABNORMAL LOW (ref 3.5–5.1)
Sodium: 133 mmol/L — ABNORMAL LOW (ref 135–145)

## 2015-06-06 LAB — CBC
HCT: 29.6 % — ABNORMAL LOW (ref 36.0–46.0)
Hemoglobin: 8.7 g/dL — ABNORMAL LOW (ref 12.0–15.0)
MCH: 17.9 pg — ABNORMAL LOW (ref 26.0–34.0)
MCHC: 29.4 g/dL — ABNORMAL LOW (ref 30.0–36.0)
MCV: 60.9 fL — AB (ref 78.0–100.0)
PLATELETS: 386 10*3/uL (ref 150–400)
RBC: 4.86 MIL/uL (ref 3.87–5.11)
RDW: 21.3 % — AB (ref 11.5–15.5)
WBC: 6.8 10*3/uL (ref 4.0–10.5)

## 2015-06-06 LAB — GLUCOSE, CAPILLARY
GLUCOSE-CAPILLARY: 100 mg/dL — AB (ref 65–99)
Glucose-Capillary: 102 mg/dL — ABNORMAL HIGH (ref 65–99)
Glucose-Capillary: 142 mg/dL — ABNORMAL HIGH (ref 65–99)

## 2015-06-06 LAB — C DIFFICILE QUICK SCREEN W PCR REFLEX
C DIFFICILE (CDIFF) INTERP: NEGATIVE
C DIFFICLE (CDIFF) ANTIGEN: NEGATIVE
C Diff toxin: NEGATIVE

## 2015-06-06 MED ORDER — INFLUENZA VAC SPLIT QUAD 0.5 ML IM SUSY
0.5000 mL | PREFILLED_SYRINGE | INTRAMUSCULAR | Status: DC
  Filled 2015-06-06: qty 0.5

## 2015-06-06 MED ORDER — INSULIN ASPART 100 UNIT/ML ~~LOC~~ SOLN
0.0000 [IU] | Freq: Three times a day (TID) | SUBCUTANEOUS | Status: DC
Start: 2015-06-06 — End: 2015-06-09
  Administered 2015-06-07 – 2015-06-08 (×3): 3 [IU] via SUBCUTANEOUS

## 2015-06-06 MED ORDER — METHYLPREDNISOLONE SODIUM SUCC 125 MG IJ SOLR
40.0000 mg | Freq: Two times a day (BID) | INTRAMUSCULAR | Status: DC
  Administered 2015-06-06 (×2): 40 mg via INTRAVENOUS
  Filled 2015-06-06 (×3): qty 2

## 2015-06-06 MED ORDER — POTASSIUM CHLORIDE CRYS ER 20 MEQ PO TBCR
40.0000 meq | EXTENDED_RELEASE_TABLET | Freq: Once | ORAL | Status: AC
  Administered 2015-06-06: 40 meq via ORAL
  Filled 2015-06-06: qty 2

## 2015-06-06 MED ORDER — PROMETHAZINE HCL 25 MG/ML IJ SOLN: 12.5000 mg | Freq: Four times a day (QID) | INTRAMUSCULAR | Status: DC | PRN

## 2015-06-06 MED ORDER — PNEUMOCOCCAL 13-VAL CONJ VACC IM SUSP
0.5000 mL | INTRAMUSCULAR | Status: DC
  Filled 2015-06-06: qty 0.5

## 2015-06-06 NOTE — Progress Notes (Addendum)
[facility]                                                                              Patient Demographics  Dominique [name], is a 34 y.o. female, DOB - May 01, 1981, POE:[id number]  Admit date - 06/03/2015   Admitting Physician Gennaro Africa, MD  Outpatient Primary MD for the patient is OSEI-BONSU,GEORGE, MD  LOS - 2   Chief Complaint  Patient presents with  . Abdominal Pain      HPI on 06/04/2015 by Dr. 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 She said the pain started back in Jan and improved only between Feb and May. Since June she continued to have abdominal pain with diarrhea and occasional vomiting. She has not been able to see GI for insurance and referral issues until last week. She said after discussing her last CT abdomen findings with GI, the plan for her was to continue a course of abx and come back but last Friday she started having severe abdominal pain that got more diffuse ( usually she has lower abdominal pain) associated with vomiting daily for 2-3 times ( non bilious and non bloody) and daily diarrhea ( soft stool with occasional blood0. She denied fever or chills. No other complaints. No chest pain , cough or dyspnea.   Interim history Patient had flex sig which showed severe inflammatory changed. Biopsy shows crohns  Assessment & Plan   Crohns colitis/chronic diarrhea -CT of the abdomen showed regional colitis. -Patient was recently placed on Flagyl as well as ciprofloxacin by gastroenterology as an outpatient. -GI pathogen panel and C. difficile pending -Continue gentle and cholestyramine -Gastroenterology consulted and appreciated, patient had flexible sigmoidoscopy which showed severe chronic inflammatory changes characterized by edema and nodularity.  -Pathology shows nonnecrotizing granulomata, consistent with active Crohn's colitis -Cdiff  negative -Spoke with Dr. Cristina Gong: Patient would benefit from follow up at [facility], however, this would not occur for several weeks.  In the meantime, we will start solumedrol 64m IV BID. -Patient will likely need Remicade however before therapy can be initiated, will give patient flu and Prevnar vaccination, obtain QuantiFERON to rule out TB as well as a hepatitis B surface antigen. -Patient will also need colonoscopy however this will be done at a later time. -Will also start patient on ISS given steroids.  Asthma -Currently controlled. Continue inhalers as needed.  Chronic microcytic anemia -Hemoglobin currently 9.6, baseline appears to be between 7-8 -Continue to monitor CBC -Continue ferrous sulfate  Asymptomatic bacteriuria -UA showed 3-6 WBC, small leukocytes, positive nitrites, few bacteria -Patient currently denies any urinary symptoms, burning or odor -Will not treat at this time  Morbid obesity -Patient will need to discuss last modifications including diet and exercise with her primary care physician upon discharge  Tachycardia  -Possibly secondary to pain -TSH 1.714  Code Status: Full  Family Communication: None at bedside  Disposition Plan: Admitted.   Time Spent in minutes   30 minutes  Procedures  Flexible sigmoidoscopy  Consults   Gastroenterology  DVT Prophylaxis  Lovenox  Lab  Results  Component Value Date   PLT 388 06/05/2015    Medications  Scheduled Meds: . cholestyramine  4 g Oral QAC breakfast  . enoxaparin (LOVENOX) injection  80 mg Subcutaneous Q24H  . ferrous sulfate  650 mg Oral Q breakfast  . metronidazole  500 mg Intravenous Q8H   Continuous Infusions: . dextrose 5 % and 0.45% NaCl 125 mL/hr at 06/06/15 0146   PRN Meds:.albuterol, morphine injection  Antibiotics    Anti-infectives    Start     Dose/Rate Route Frequency Ordered Stop   06/04/15 1200  metroNIDAZOLE (FLAGYL) IVPB 500 mg     500 mg 100 mL/hr over 60 Minutes  Intravenous Every 8 hours 06/04/15 1034     06/04/15 0015  cefTRIAXone (ROCEPHIN) 1 g in dextrose 5 % 50 mL IVPB     1 g 100 mL/hr over 30 Minutes Intravenous  Once 06/04/15 0012 06/04/15 0108       Subjective:   Dominique [name] seen and examined today. Patient continues to have abdominal pain particularly in the upper abdomen. Denies any episodes of diarrhea at this time. Currently denies any nausea or vomiting. Denies any chest pain, shortness breath, headache or dizziness.  Objective:   Filed Vitals:   06/05/15 1220 06/05/15 1252 06/05/15 2315 06/06/15 0627  BP: 100/47 118/56 113/66 117/69  Pulse: 103 110 106 81  Temp:  98.3 F (36.8 C) 98.5 F (36.9 C) 98.4 F (36.9 C)  TempSrc:  Oral Oral Oral  Resp: 19 18 18 18   Height:      Weight:      SpO2: 96% 94% 96% 97%    Wt Readings from Last 3 Encounters:  06/03/15 167.009 kg (368 lb 3 oz)  05/25/15 172.367 kg (380 lb)  04/25/15 185.975 kg (410 lb)     Intake/Output Summary (Last 24 hours) at 06/06/15 1025 Last data filed at 06/06/15 0629  Gross per 24 hour  Intake 6589.17 ml  Output      0 ml  Net 6589.17 ml    Exam  General: Well developed, well nourished, NAD  HEENT: NCAT,  mucous membranes moist.   Cardiovascular: S1 S2 auscultated, tachycardic, no murmurs.  Respiratory: Clear to auscultation   Abdomen: Soft, obese, Mildly TTP, nondistended, + bowel sounds, no rebound or guarding  Extremities: warm dry without cyanosis clubbing or edema  Neuro: AAOx3, nonfocal  Psych: appropriate mood and affect  Data Review   Micro Results Recent Results (from the past 240 hour(s))  Urine culture     Status: None   Collection Time: 06/03/15 11:36 PM  Result Value Ref Range Status   Specimen Description URINE, CATHETERIZED  Final   Special Requests NONE  Final   Culture NO GROWTH 1 DAY  Final   Report Status 06/05/2015 FINAL  Final  C difficile quick scan w PCR reflex     Status: None   Collection Time: 06/06/15   8:59 AM  Result Value Ref Range Status   C Diff antigen NEGATIVE NEGATIVE Final   C Diff toxin NEGATIVE NEGATIVE Final   C Diff interpretation Negative for toxigenic C. difficile  Final    Radiology Reports Ct Abdomen Pelvis W Contrast  06/04/2015   CLINICAL DATA:  Nausea, vomiting, diarrhea, and fever for 3 weeks. Diffuse abdominal pain more in the right lower quadrant.  EXAM: CT ABDOMEN AND PELVIS WITH CONTRAST  TECHNIQUE: Multidetector CT imaging of the abdomen and pelvis was performed using the standard protocol following bolus  administration of intravenous contrast.  CONTRAST:  128m OMNIPAQUE IOHEXOL 300 MG/ML  SOLN  COMPARISON:  05/25/2015 from [facility] Lung bases are clear.  Diffuse fatty infiltration of the liver. Gallbladder, spleen, pancreas, adrenal glands, kidneys, abdominal aorta, inferior vena cava, and retroperitoneal lymph nodes are unremarkable. Stomach and small bowel are decompressed. Contrast material is present in the colon suggesting no evidence of small bowel obstruction. There is diffuse colonic wall thickening and diffuse infiltration in the pericolonic fat extending from the splenic flexure along the descending colon and to the sigmoid colon. This is consistent with colitis and could represent inflammatory or infectious etiologies. The amount of pericolonic infiltration is increasing since the previous study, vertically around the sigmoid region. No free air or free fluid in the abdomen. Abdominal wall musculature appears intact.  Pelvis: Bladder wall is not thickened. Uterus and ovaries are not enlarged. Appendix is normal. No pelvic mass or lymphadenopathy. No destructive bone lesions.  IMPRESSION: Wall thickening and prominent pericolonic infiltration around the descending and sigmoid colon consistent with regional colitis. Inflammatory reaction is increasing since previous study. No discrete abscess identified.   Electronically Signed   By: WLucienne CapersM.D.   On: 06/04/2015 04:35   Ct Abdomen Pelvis W Contrast  05/25/2015   CLINICAL DATA:  Abdominal pain radiating to the left side for several months. Nausea and vomiting beginning in July.  EXAM: CT ABDOMEN AND PELVIS WITH CONTRAST  TECHNIQUE: Multidetector CT imaging of the abdomen and pelvis was performed using the standard protocol following bolus administration of intravenous contrast.  CONTRAST:  100 mL OMNIPAQUE IOHEXOL 300 MG/ML  SOLN  COMPARISON:  CT abdomen and pelvis 05/20/2015 and 11/17/2014.  FINDINGS: The lung bases are clear. No pleural or pericardial effusion. Heart size is mildly enlarged.  As seen on the prior examination, there is wall thickening with surrounding stranding of the descending colon to the sigmoid. The appearance is not markedly changed. Prominent stool burden in the ascending and transverse colon is noted. The appendix is unremarkable. The stomach and small bowel appear normal.  There is fatty infiltration of the liver. The spleen, adrenal glands, pancreas and kidneys appear normal. No lymphadenopathy or fluid is identified. Uterus, adnexa and urinary bladder are unremarkable.  Image bones are unremarkable.  IMPRESSION: Findings consistent with colitis of the descending colon, not markedly changed compare to the patient's recent CT abdomen and pelvis. No new abnormality.  Fatty infiltration of the liver.  Cardiomegaly.   Electronically Signed   By: TInge RiseM.D.   On: 05/25/2015 16:13   Ct Abdomen Pelvis W Contrast  05/20/2015   CLINICAL DATA:  Left lower quadrant abdominal pain. History of diverticulitis.  EXAM: CT ABDOMEN AND PELVIS WITH CONTRAST  TECHNIQUE: Multidetector CT imaging of the abdomen and pelvis was performed using the standard protocol following bolus administration of intravenous contrast.  CONTRAST:  1056mOMNIPAQUE IOHEXOL 300 MG/ML SOLN, 5013mMNIPAQUE IOHEXOL 300 MG/ML SOLN  COMPARISON:  None.  FINDINGS: Lung bases are clear. No effusions.  Heart is normal size.  Mild diffuse fatty infiltration of the liver. No focal abnormality. Gallbladder, spleen, pancreas, adrenals and kidneys are unremarkable.  There is marked wall thickening and surrounding inflammatory changes around the entire descending colon and proximal sigmoid colon. Findings most compatible with colitis. No fluid collection. No free air. Small bowel and stomach are unremarkable. Moderate stool burden throughout the colon.  Uterus, adnexae and urinary bladder are unremarkable. No free fluid, free  air or adenopathy. There are mildly prominent pericolonic lymph nodes adjacent to the rectum and sigmoid colon, presumably reactive. Aorta is normal caliber.  No acute bony abnormality or focal bone lesion.  IMPRESSION: Marked colonic wall thickening and surrounding inflammatory change involving the entire descending colon and proximal sigmoid colon compatible with colitis.  Moderate stool burden throughout the colon.  Fatty liver.   Electronically Signed   By: Rolm Baptise M.D.   On: 05/20/2015 19:07    CBC  Recent Labs Lab 06/03/15 1840 06/05/15 1455  WBC 6.6 7.6  HGB 10.2* 9.6*  HCT 33.8* 31.0*  PLT 498* 388  MCV 61.3* 60.7*  MCH 18.5* 18.8*  MCHC 30.2 31.0  RDW 21.5* 21.5*    Chemistries   Recent Labs Lab 06/03/15 1840 06/05/15 1455  NA 137 135  K 3.7 3.4*  CL 102 102  CO2 24 26  GLUCOSE 84 98  BUN <5* 7  CREATININE 0.72 0.65  CALCIUM 8.4* 8.0*  AST 22  --   ALT 15  --   ALKPHOS 79  --   BILITOT 0.9  --    ------------------------------------------------------------------------------------------------------------------ estimated creatinine clearance is 151.6 mL/min (by C-G formula based on Cr of 0.65). ------------------------------------------------------------------------------------------------------------------ No results for input(s): HGBA1C in the last 72  hours. ------------------------------------------------------------------------------------------------------------------ No results for input(s): CHOL, HDL, LDLCALC, TRIG, CHOLHDL, LDLDIRECT in the last 72 hours. ------------------------------------------------------------------------------------------------------------------  Recent Labs  06/05/15 1455  TSH 1.714   ------------------------------------------------------------------------------------------------------------------ No results for input(s): VITAMINB12, FOLATE, FERRITIN, TIBC, IRON, RETICCTPCT in the last 72 hours.  Coagulation profile No results for input(s): INR, PROTIME in the last 168 hours.  No results for input(s): DDIMER in the last 72 hours.  Cardiac Enzymes No results for input(s): CKMB, TROPONINI, MYOGLOBIN in the last 168 hours.  Invalid input(s): CK ------------------------------------------------------------------------------------------------------------------ Invalid input(s): POCBNP    Dominique [name] D.O. on 06/06/2015 at 10:25 AM  Between 7am to 7pm - Pager - [phone]  After 7pm go to [email or web address] - password [email or web address]  And look for the night coverage person covering for me after hours  [facility] Office  [phone]

## 2015-06-06 NOTE — Progress Notes (Signed)
GASTROENTEROLOGY PROGRESS NOTE  Problem:   Left-sided Crohn's colitis  Subjective: Still intolerant of food. Not too much abdominal pain.  Objective: Biopsies have come back consistent with Crohn's disease. Abdomen is quite exquisitely tender in the left lower quadrant. C. difficile negative.  Assessment: Crohn's colitis of moderate severity  Plan: 1. I have discussed the case with the patient's attending hospital physician, who is obtaining baseline labs and performing baseline vaccinations in the event of initiation of anti-TNF therapy, which seems probable.  2. I discussed the patient's case with her for over 30 minutes, explaining what Crohn's disease is, prognosis, management, role of surgery versus medical therapy, and in particular, possible short and long-term complications of steroid therapy.  My plan is to see how the patient responds to steroid therapy over the next couple of days, by which time we will have back screening blood work to know if we are able to use anti-TNF therapy.   Depending on her response to the steroids, it might be possible to tide her over on that medication until a final decision can be made about anti-TNF therapy with either her primary gastroenterologist, Dr. Acquanetta Sit, and/or the GI consultant at [facility] (I spoke with Dr. Vincenza Hews on the phone earlier today, who recommended the above blood work and vaccinations, and initiation of anti-TNF therapy on a regular per kilogram basis, despite this patient's very large body size.)   More likely, the patient will not show prompt improvement and we will need to start her on anti-TNF therapy (Dr. Carlos Levering recommended Remicade)  before she leaves the hospital.   If we cannot make any headway and get her to the point where she is able to eat and have a tolerable level of pain control, a direct hospital to hospital transfer might be appropriate.   Cleotis Nipper, M.D. 06/06/2015 5:02 PM  Pager [phone] If  no answer or after 5 PM call [phone]

## 2015-06-07 ENCOUNTER — Encounter (HOSPITAL_COMMUNITY): Payer: Self-pay | Admitting: Gastroenterology

## 2015-06-07 LAB — RETICULOCYTES
RBC.: 4.95 MIL/uL (ref 3.87–5.11)
RETIC COUNT ABSOLUTE: 69.3 10*3/uL (ref 19.0–186.0)
Retic Ct Pct: 1.4 % (ref 0.4–3.1)

## 2015-06-07 LAB — HEPATITIS B SURFACE ANTIGEN: Hepatitis B Surface Ag: NEGATIVE

## 2015-06-07 LAB — GLUCOSE, CAPILLARY
GLUCOSE-CAPILLARY: 102 mg/dL — AB (ref 65–99)
GLUCOSE-CAPILLARY: 119 mg/dL — AB (ref 65–99)
Glucose-Capillary: 132 mg/dL — ABNORMAL HIGH (ref 65–99)
Glucose-Capillary: 113 mg/dL — ABNORMAL HIGH (ref 65–99)

## 2015-06-07 LAB — CBC
HEMATOCRIT: 29 % — AB (ref 36.0–46.0)
Hemoglobin: 8.8 g/dL — ABNORMAL LOW (ref 12.0–15.0)
MCH: 18.4 pg — ABNORMAL LOW (ref 26.0–34.0)
MCHC: 30.3 g/dL (ref 30.0–36.0)
MCV: 60.8 fL — AB (ref 78.0–100.0)
PLATELETS: 379 10*3/uL (ref 150–400)
RBC: 4.77 MIL/uL (ref 3.87–5.11)
RDW: 21.6 % — ABNORMAL HIGH (ref 11.5–15.5)
WBC: 13 10*3/uL — AB (ref 4.0–10.5)

## 2015-06-07 LAB — BASIC METABOLIC PANEL
ANION GAP: 8 (ref 5–15)
BUN: <5 mg/dL — ABNORMAL LOW (ref 6–20)
CO2: 23 mmol/L (ref 22–32)
Calcium: 7.8 mg/dL — ABNORMAL LOW (ref 8.9–10.3)
Chloride: 104 mmol/L (ref 101–111)
Creatinine, Ser: 0.5 mg/dL (ref 0.44–1.00)
GFR calc Af Amer: >60 mL/min (ref >60)
GLUCOSE: 149 mg/dL — AB (ref 65–99)
POTASSIUM: 3.6 mmol/L (ref 3.5–5.1)
Sodium: 135 mmol/L (ref 135–145)

## 2015-06-07 LAB — IRON AND TIBC
IRON: 11 ug/dL — AB (ref 28–170)
Saturation Ratios: 5 % — ABNORMAL LOW (ref 10.4–31.8)
TIBC: 216 ug/dL — ABNORMAL LOW (ref 250–450)
UIBC: 205 ug/dL

## 2015-06-07 LAB — FOLATE: FOLATE: 5.4 ng/mL — AB (ref >5.9)

## 2015-06-07 LAB — VITAMIN B12: Vitamin B-12: 1837 pg/mL — ABNORMAL HIGH (ref 180–914)

## 2015-06-07 LAB — FERRITIN: Ferritin: 8 ng/mL — ABNORMAL LOW (ref 11–307)

## 2015-06-07 LAB — HEPATITIS B SURFACE ANTIBODY, QUANTITATIVE: Hepatitis B-Post: <3.1 m[IU]/mL — ABNORMAL LOW (ref >9.9)

## 2015-06-07 MED ORDER — ENSURE ENLIVE PO LIQD
237.0000 mL | Freq: Two times a day (BID) | ORAL | Status: DC
  Administered 2015-06-08 – 2015-06-09 (×3): 237 mL via ORAL

## 2015-06-07 MED ORDER — PREDNISONE 50 MG PO TABS
80.0000 mg | ORAL_TABLET | Freq: Every day | ORAL | Status: DC
  Administered 2015-06-07 – 2015-06-09 (×3): 80 mg via ORAL
  Filled 2015-06-07: qty 1
  Filled 2015-06-07: qty 8
  Filled 2015-06-07 (×2): qty 4

## 2015-06-07 MED ORDER — HYDROCODONE-ACETAMINOPHEN 5-325 MG PO TABS
1.0000 | ORAL_TABLET | ORAL | Status: DC | PRN
  Administered 2015-06-07 – 2015-06-09 (×9): 1 via ORAL
  Filled 2015-06-07 (×10): qty 1

## 2015-06-07 MED ORDER — ONDANSETRON 4 MG PO TBDP
4.0000 mg | ORAL_TABLET | Freq: Three times a day (TID) | ORAL | Status: DC | PRN
  Administered 2015-06-07 – 2015-06-08 (×2): 4 mg via ORAL
  Filled 2015-06-07 (×4): qty 1

## 2015-06-07 NOTE — Progress Notes (Signed)
Initial Nutrition Assessment  DOCUMENTATION CODES:   Severe malnutrition in context of acute illness/injury, Morbid obesity   Pt meets criteria for SEVERE MALNUTRITION in the context of acute illness as evidenced by a 12% weight loss in 2 months and energy intake </= 50% for >/= 5 days. .  INTERVENTION:   Provide Ensure Enlive po BID, each supplement provides 350 kcal and 20 grams of protein.  Encourage adequate PO intake.  NUTRITION DIAGNOSIS:   Inadequate oral intake related to poor appetite, nausea, vomiting as evidenced by meal completion < 50%.  GOAL:   Patient will meet greater than or equal to 90% of their needs  MONITOR:   PO intake, Supplement acceptance, Weight trends, Labs, I & O's  REASON FOR ASSESSMENT:   Malnutrition Screening Tool    ASSESSMENT:   Patient is a 34 year old female with history of asthma and 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 Pt with recurrent colitis.  Meal completion has been 25-50%. Pt reports having a decreased appetite which has been ongoing over the past 2 months that has been complicated by n/v. Pt reports she has only been able to consume one meal a day which has been mainly a soup and/or sandwich. Pt reports weight loss. Per Epic weight records, pt with a 12% weight loss in 2 months. Pt is agreeable to nutritional supplement to aid in caloric and protein needs. RD to order. Pt was educated to continue nutritional supplementation at home especially if po intake is poor.   Pt with no observed significant fat or muscle mass loss.   Labs and medications reviewed.  Diet Order:  DIET SOFT Room service appropriate?: Yes; Fluid consistency:: Thin  Skin:  Reviewed, no issues  Last BM:  9/13  Height:   Ht Readings from Last 1 Encounters:  06/03/15 5' 2"  (1.575 m)    Weight:   Wt Readings from Last 1 Encounters:  06/03/15 368 lb 3 oz (167.009 kg)    Ideal Body Weight:  50  kg  BMI:  Body mass index is 67.33 kg/(m^2).  Estimated Nutritional Needs:   Kcal:  2000-2200  Protein:  125-140 grams  Fluid:  2- 2.2 L/day  EDUCATION NEEDS:   Education needs addressed  Corrin Parker, MS, RD, LDN Pager # [phone] After hours/ weekend pager # [phone]

## 2015-06-07 NOTE — Progress Notes (Signed)
Pain is perhaps a tiny bit better, but still unable to eat.  On exam, the abdomen is slightly less tender than yesterday.  Management discussed with attending hospitalist. I do not think this patient needs antibiotic therapy in view of her biopsy results. I do think we could switch to oral steroids, to save her the need for an IV and a PICC line.  I would estimate that we should start anti-TNF therapy in approximately 2-3 days if she does not show significant improvement in the interim.  Cleotis Nipper, M.D. Pager [phone] If no answer or after 5 PM call [phone]

## 2015-06-07 NOTE — Progress Notes (Signed)
34 year old. ? asthma and recurrent colitis since jan 2016 who came to the ED 9/11 with cc of severe abdominal pain. pain started back in Jan and improved only between Feb and May.  Since June she continued to have abdominal pain with diarrhea and occasional vomiting.  She has not been able to see GI for insurance and referral issues until last week.  She said after discussing her last CT abdomen findings with GI, the plan for her was to continue a course of abx and come back but last Friday she started having severe abdominal pain that got more diffuse  ( usually she has lower abdominal pain) associated with vomiting daily for 2-3 times ( non bilious and non bloody) and daily diarrhea ( soft stool with occasional blood0.  She denied fever or chills.  No other complaints.  No chest pain ,  cough or dyspnea.   Cdiff Neg Flex Sig 9/12 ENDOSCOPIC IMPRESSION: Severe chronic inflammatory changes in the proximal sigmoid and descending colon, characterized by edema, nodularity, and mucinous exudate. Normal rectum and distal sigmoid. The changes are very nonspecific in their appearance, and do not look like typical Crohn's disease, ulcerative colitis, or ischemia    Assessment & Plan   Crohns colitis/chronic diarrhea -CT of the abdomen showed regional colitis. -Patient was recently placed on Flagyl as well as ciprofloxacin by gastroenterology as an outpatient. -GI pathogen panel [pend - C. Difficile neg -Continue flagyll + cholestyramine -Gastroenterology consulted and appreciated, patient had flexible sigmoidoscopy which showed severe chronic inflammatory changes characterized by edema and nodularity.  -Pathology shows nonnecrotizing granulomata, consistent with active Crohn's colitis -Cdiff negative. -Per Dr. Cristina Gong: Patient would benefit from follow up at [facility], however, this would not occur for several weeks.  In the meantime, we will start solumedrol 24m IV BID. -Patient will  likely need Remicade however before therapy can be initiated, will give patient flu and Prevnar vaccination,  QuantiFERON to rule out TB is pending, hepatitis B surface antigen is neg -Patient will also need colonoscopy however this will be done at a later time. -staretd on IV Solu-medrol 40 q 12steroids.  Asthma -Currently controlled. Continue inhalers as needed.  Chronic microcytic anemia -Hemoglobin currently 9.6, baseline appears to be between 7-8 -Continue to monitor CBC -Continue ferrous sulfate -obtain iron studies.  FOBT likel;y to be + with active inflammation -transfusion threshold ~ 7.0  Asymptomatic bacteriuria -UA showed 3-6 WBC, small leukocytes, positive nitrites, few bacteria -Patient currently denies any urinary symptoms, burning or odor -Will not treat at this time  Morbid obesity -Patient will need to discuss last modifications including diet and exercise with her primary care physician upon discharge  Tachycardia  -Possibly secondary to pain -TSH 1.714  Code Status: Full  Family Communication: None at bedside  Disposition Plan: Admitted.   Time Spent in minutes   30 minutes  Procedures  Flexible sigmoidoscopy  Consults   Gastroenterology  DVT Prophylaxis  Lovenox  Lab Results  Component Value Date   PLT 379 06/07/2015    Medications  Scheduled Meds: . cholestyramine  4 g Oral QAC breakfast  . enoxaparin (LOVENOX) injection  80 mg Subcutaneous Q24H  . ferrous sulfate  650 mg Oral Q breakfast  . Influenza vac split quadrivalent PF  0.5 mL Intramuscular Tomorrow-1000  . insulin aspart  0-20 Units Subcutaneous TID WC  . methylPREDNISolone (SOLU-MEDROL) injection  40 mg Intravenous Q12H  . metronidazole  500 mg Intravenous Q8H  . pneumococcal  13-valent conjugate vaccine  0.5 mL Intramuscular Tomorrow-1000   Continuous Infusions: . dextrose 5 % and 0.45% NaCl 125 mL/hr at 06/06/15 2131   PRN Meds:.albuterol, morphine injection,  promethazine  Antibiotics    Anti-infectives    Start     Dose/Rate Route Frequency Ordered Stop   06/04/15 1200  metroNIDAZOLE (FLAGYL) IVPB 500 mg     500 mg 100 mL/hr over 60 Minutes Intravenous Every 8 hours 06/04/15 1034     06/04/15 0015  cefTRIAXone (ROCEPHIN) 1 g in dextrose 5 % 50 mL IVPB     1 g 100 mL/hr over 30 Minutes Intravenous  Once 06/04/15 0012 06/04/15 0108       Subjective:   Doing fair Seen in conjunction with Dr. Cristina Gong of GI tolerating some liquids but not solids Pain is decreased No cp no sob No vomit  Objective:   Filed Vitals:   06/06/15 0627 06/06/15 1400 06/06/15 2105 06/07/15 0557  BP: 117/69 114/72 113/68 118/58  Pulse: 81 109 113 83  Temp: 98.4 F (36.9 C) 98.8 F (37.1 C) 98.9 F (37.2 C) 98.9 F (37.2 C)  TempSrc: Oral  Oral Oral  Resp: 18 16 16 18   Height:      Weight:      SpO2: 97%  97% 96%    Wt Readings from Last 3 Encounters:  06/03/15 167.009 kg (368 lb 3 oz)  05/25/15 172.367 kg (380 lb)  04/25/15 185.975 kg (410 lb)     Intake/Output Summary (Last 24 hours) at 06/07/15 0848 Last data filed at 06/06/15 1700  Gross per 24 hour  Intake    120 ml  Output      0 ml  Net    120 ml    Exam  General: Well  nourished, NAD  HEENT: NCAT,  mucous membranes moist.   Cardiovascular: S1 S2 auscultated, tachycardic, no murmurs.  Respiratory: Clear to auscultation   Abdomen: Soft, obese, Mildly TTP, nondistended, + bowel sounds, no rebound or guarding  Extremities: warm dry without cyanosis clubbing or edema  Neuro: AAOx3, nonfocal  Psych: appropriate mood and affect  Data Review   Micro Results Recent Results (from the past 240 hour(s))  Urine culture     Status: None   Collection Time: 06/03/15 11:36 PM  Result Value Ref Range Status   Specimen Description URINE, CATHETERIZED  Final   Special Requests NONE  Final   Culture NO GROWTH 1 DAY  Final   Report Status 06/05/2015 FINAL  Final  C difficile quick  scan w PCR reflex     Status: None   Collection Time: 06/06/15  8:59 AM  Result Value Ref Range Status   C Diff antigen NEGATIVE NEGATIVE Final   C Diff toxin NEGATIVE NEGATIVE Final   C Diff interpretation Negative for toxigenic C. difficile  Final    Radiology Reports Ct Abdomen Pelvis W Contrast  06/04/2015   CLINICAL DATA:  Nausea, vomiting, diarrhea, and fever for 3 weeks. Diffuse abdominal pain more in the right lower quadrant.  EXAM: CT ABDOMEN AND PELVIS WITH CONTRAST  TECHNIQUE: Multidetector CT imaging of the abdomen and pelvis was performed using the standard protocol following bolus administration of intravenous contrast.  CONTRAST:  116m OMNIPAQUE IOHEXOL 300 MG/ML  SOLN  COMPARISON:  05/25/2015 from [facility] Lung bases are clear.  Diffuse fatty infiltration of the liver. Gallbladder, spleen, pancreas, adrenal glands, kidneys, abdominal aorta, inferior vena cava, and retroperitoneal lymph nodes are  unremarkable. Stomach and small bowel are decompressed. Contrast material is present in the colon suggesting no evidence of small bowel obstruction. There is diffuse colonic wall thickening and diffuse infiltration in the pericolonic fat extending from the splenic flexure along the descending colon and to the sigmoid colon. This is consistent with colitis and could represent inflammatory or infectious etiologies. The amount of pericolonic infiltration is increasing since the previous study, vertically around the sigmoid region. No free air or free fluid in the abdomen. Abdominal wall musculature appears intact.  Pelvis: Bladder wall is not thickened. Uterus and ovaries are not enlarged. Appendix is normal. No pelvic mass or lymphadenopathy. No destructive bone lesions.  IMPRESSION: Wall thickening and prominent pericolonic infiltration around the descending and sigmoid colon consistent with regional colitis. Inflammatory reaction is increasing since previous study. No  discrete abscess identified.   Electronically Signed   By: Lucienne Capers M.D.   On: 06/04/2015 04:35   Ct Abdomen Pelvis W Contrast  05/25/2015   CLINICAL DATA:  Abdominal pain radiating to the left side for several months. Nausea and vomiting beginning in July.  EXAM: CT ABDOMEN AND PELVIS WITH CONTRAST  TECHNIQUE: Multidetector CT imaging of the abdomen and pelvis was performed using the standard protocol following bolus administration of intravenous contrast.  CONTRAST:  100 mL OMNIPAQUE IOHEXOL 300 MG/ML  SOLN  COMPARISON:  CT abdomen and pelvis 05/20/2015 and 11/17/2014.  FINDINGS: The lung bases are clear. No pleural or pericardial effusion. Heart size is mildly enlarged.  As seen on the prior examination, there is wall thickening with surrounding stranding of the descending colon to the sigmoid. The appearance is not markedly changed. Prominent stool burden in the ascending and transverse colon is noted. The appendix is unremarkable. The stomach and small bowel appear normal.  There is fatty infiltration of the liver. The spleen, adrenal glands, pancreas and kidneys appear normal. No lymphadenopathy or fluid is identified. Uterus, adnexa and urinary bladder are unremarkable.  Image bones are unremarkable.  IMPRESSION: Findings consistent with colitis of the descending colon, not markedly changed compare to the patient's recent CT abdomen and pelvis. No new abnormality.  Fatty infiltration of the liver.  Cardiomegaly.   Electronically Signed   By: Inge Rise M.D.   On: 05/25/2015 16:13   Ct Abdomen Pelvis W Contrast  05/20/2015   CLINICAL DATA:  Left lower quadrant abdominal pain. History of diverticulitis.  EXAM: CT ABDOMEN AND PELVIS WITH CONTRAST  TECHNIQUE: Multidetector CT imaging of the abdomen and pelvis was performed using the standard protocol following bolus administration of intravenous contrast.  CONTRAST:  112m OMNIPAQUE IOHEXOL 300 MG/ML SOLN, 52mOMNIPAQUE IOHEXOL 300 MG/ML SOLN   COMPARISON:  None.  FINDINGS: Lung bases are clear. No effusions. Heart is normal size.  Mild diffuse fatty infiltration of the liver. No focal abnormality. Gallbladder, spleen, pancreas, adrenals and kidneys are unremarkable.  There is marked wall thickening and surrounding inflammatory changes around the entire descending colon and proximal sigmoid colon. Findings most compatible with colitis. No fluid collection. No free air. Small bowel and stomach are unremarkable. Moderate stool burden throughout the colon.  Uterus, adnexae and urinary bladder are unremarkable. No free fluid, free air or adenopathy. There are mildly prominent pericolonic lymph nodes adjacent to the rectum and sigmoid colon, presumably reactive. Aorta is normal caliber.  No acute bony abnormality or focal bone lesion.  IMPRESSION: Marked colonic wall thickening and surrounding inflammatory change involving the entire descending colon and proximal sigmoid colon  compatible with colitis.  Moderate stool burden throughout the colon.  Fatty liver.   Electronically Signed   By: Rolm Baptise M.D.   On: 05/20/2015 19:07    CBC  Recent Labs Lab 06/03/15 1840 06/05/15 1455 06/06/15 1121 06/07/15 0448  WBC 6.6 7.6 6.8 13.0*  HGB 10.2* 9.6* 8.7* 8.8*  HCT 33.8* 31.0* 29.6* 29.0*  PLT 498* 388 386 379  MCV 61.3* 60.7* 60.9* 60.8*  MCH 18.5* 18.8* 17.9* 18.4*  MCHC 30.2 31.0 29.4* 30.3  RDW 21.5* 21.5* 21.3* 21.6*    Chemistries   Recent Labs Lab 06/03/15 1840 06/05/15 1455 06/06/15 1121 06/07/15 0448  NA 137 135 133* 135  K 3.7 3.4* 3.2* 3.6  CL 102 102 100* 104  CO2 24 26 27 23   GLUCOSE 84 98 103* 149*  BUN <5* 7 6 <5*  CREATININE 0.72 0.65 0.57 0.50  CALCIUM 8.4* 8.0* 7.7* 7.8*  AST 22  --   --   --   ALT 15  --   --   --   ALKPHOS 79  --   --   --   BILITOT 0.9  --   --   --    ------------------------------------------------------------------------------------------------------------------ estimated creatinine  clearance is 150.1 mL/min (by C-G formula based on Cr of 0.5). ------------------------------------------------------------------------------------------------------------------ No results for input(s): HGBA1C in the last 72 hours. ------------------------------------------------------------------------------------------------------------------ No results for input(s): CHOL, HDL, LDLCALC, TRIG, CHOLHDL, LDLDIRECT in the last 72 hours. ------------------------------------------------------------------------------------------------------------------  Recent Labs  06/05/15 1455  TSH 1.714   ------------------------------------------------------------------------------------------------------------------ No results for input(s): VITAMINB12, FOLATE, FERRITIN, TIBC, IRON, RETICCTPCT in the last 72 hours.  Coagulation profile No results for input(s): INR, PROTIME in the last 168 hours.  No results for input(s): DDIMER in the last 72 hours.  Cardiac Enzymes No results for input(s): CKMB, TROPONINI, MYOGLOBIN in the last 168 hours.  Invalid input(s): CK ------------------------------------------------------------------------------------------------------------------ Invalid input(s): POCBNP    Verneita Griffes, MD [facility] [phone]

## 2015-06-08 LAB — CBC
HCT: 28.2 % — ABNORMAL LOW (ref 36.0–46.0)
HEMOGLOBIN: 8.5 g/dL — AB (ref 12.0–15.0)
MCH: 18.4 pg — AB (ref 26.0–34.0)
MCHC: 30.1 g/dL (ref 30.0–36.0)
MCV: 61.2 fL — ABNORMAL LOW (ref 78.0–100.0)
PLATELETS: 394 10*3/uL (ref 150–400)
RBC: 4.61 MIL/uL (ref 3.87–5.11)
RDW: 22 % — AB (ref 11.5–15.5)
WBC: 12.5 10*3/uL — ABNORMAL HIGH (ref 4.0–10.5)

## 2015-06-08 LAB — COMPREHENSIVE METABOLIC PANEL
ALBUMIN: 2.2 g/dL — AB (ref 3.5–5.0)
ALK PHOS: 79 U/L (ref 38–126)
ALT: 13 U/L — ABNORMAL LOW (ref 14–54)
ANION GAP: 5 (ref 5–15)
AST: 20 U/L (ref 15–41)
BUN: <5 mg/dL — ABNORMAL LOW (ref 6–20)
CO2: 26 mmol/L (ref 22–32)
Calcium: 8.1 mg/dL — ABNORMAL LOW (ref 8.9–10.3)
Chloride: 105 mmol/L (ref 101–111)
Creatinine, Ser: 0.5 mg/dL (ref 0.44–1.00)
GFR calc non Af Amer: >60 mL/min (ref >60)
GLUCOSE: 116 mg/dL — AB (ref 65–99)
POTASSIUM: 4.1 mmol/L (ref 3.5–5.1)
SODIUM: 136 mmol/L (ref 135–145)
Total Bilirubin: 0.6 mg/dL (ref 0.3–1.2)
Total Protein: 6.2 g/dL — ABNORMAL LOW (ref 6.5–8.1)

## 2015-06-08 LAB — GLUCOSE, CAPILLARY
GLUCOSE-CAPILLARY: 114 mg/dL — AB (ref 65–99)
Glucose-Capillary: 123 mg/dL — ABNORMAL HIGH (ref 65–99)
Glucose-Capillary: 122 mg/dL — ABNORMAL HIGH (ref 65–99)
Glucose-Capillary: 110 mg/dL — ABNORMAL HIGH (ref 65–99)

## 2015-06-08 LAB — GI PATHOGEN PANEL BY PCR, STOOL
C difficile toxin A/B: NOT DETECTED
Campylobacter by PCR: NOT DETECTED
Cryptosporidium by PCR: NOT DETECTED
E COLI (ETEC) LT/ST: NOT DETECTED
E COLI (STEC): NOT DETECTED
E COLI 0157 BY PCR: NOT DETECTED
G lamblia by PCR: NOT DETECTED
Norovirus GI/GII: NOT DETECTED
Rotavirus A by PCR: NOT DETECTED
SALMONELLA BY PCR: NOT DETECTED
SHIGELLA BY PCR: NOT DETECTED

## 2015-06-08 LAB — QUANTIFERON IN TUBE
QFT TB AG MINUS NIL VALUE: 0.02 [IU]/mL
QUANTIFERON MITOGEN VALUE: 1.91 IU/mL
QUANTIFERON NIL VALUE: 0.08 [IU]/mL
QUANTIFERON TB AG VALUE: 0.1 [IU]/mL
QUANTIFERON TB GOLD: NEGATIVE

## 2015-06-08 LAB — QUANTIFERON TB GOLD ASSAY (BLOOD)

## 2015-06-08 NOTE — Progress Notes (Signed)
34 year old. ? asthma and recurrent colitis since jan 2016 who came to the ED 9/11 with cc of severe abdominal pain. pain started back in Jan and improved only between Feb and May.  Since June she continued to have abdominal pain with diarrhea and occasional vomiting.  She has not been able to see GI for insurance and referral issues until last week.  She said after discussing her last CT abdomen findings with GI, the plan for her was to continue a course of abx and come back but last Friday she started having severe abdominal pain that got more diffuse  ( usually she has lower abdominal pain) associated with vomiting daily for 2-3 times ( non bilious and non bloody) and daily diarrhea ( soft stool with occasional blood0.  She denied fever or chills.  No other complaints.  No chest pain ,  cough or dyspnea.   Cdiff Neg Flex Sig 9/12 ENDOSCOPIC IMPRESSION: Severe chronic inflammatory changes in the proximal sigmoid and descending colon, characterized by edema, nodularity, and mucinous exudate. Normal rectum and distal sigmoid. The changes are very nonspecific in their appearance, and do not look like typical Crohn's disease, ulcerative colitis, or ischemia    Assessment & Plan   Crohns colitis/chronic diarrhea -CT of the abdomen showed regional colitis. -Patient was recently placed on Flagyl as well as ciprofloxacin by gastroenterology as an outpatient. -GI pathogen panel pending still - C. Difficile neg - d/c flagyll 9/14 + continue cholestyramine -Gastroenterology consulted and appreciated, patient had flexible sigmoidoscopy which showed severe chronic inflammatory changes characterized by edema and nodularity.  -Pathology shows nonnecrotizing granulomata, consistent with active Crohn's colitis -Cdiff negative. -Per Dr. Buccini:?ollow up at [facility], however, this would not occur for several weeks.  In the meantime, we will start solumedrol 5m IV BID--this was switched to  Prednisone 80 mg as no IV on 9/14 -?d Remicade however before therapy can be initiated, will give patient flu and Prevnar vaccination,  QuantiFERON to rule out TB is pending, hepatitis B surface antigen is neg -Patient will also need colonoscopy however this will be done at a later time.  Asthma -Currently controlled. Continue inhalers as needed.  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  FOBT likely to be + with active inflammation -transfusion threshold ~ 7.0  Asymptomatic bacteriuria -UA showed 3-6 WBC, small leukocytes, positive nitrites, few bacteria -Patient currently denies any urinary symptoms, burning or odor -Will not treat at this time  Morbid obesity with mod malnutrition -Patient will need to discuss last modifications including diet and exercise with her primary care physician upon discharge -she has lost a significant amount of weight over past month  Tachycardia  -Possibly secondary to pain -TSH 1.714  Code Status: Full  Family Communication: None at bedside  Disposition Plan: Admitted.   Time Spent in minutes   30 minutes  Procedures  Flexible sigmoidoscopy  Consults   Gastroenterology  DVT Prophylaxis  Lovenox  Lab Results  Component Value Date   PLT 394 06/08/2015    Medications  Scheduled Meds: . cholestyramine  4 g Oral QAC breakfast  . enoxaparin (LOVENOX) injection  80 mg Subcutaneous Q24H  . feeding supplement (ENSURE ENLIVE)  237 mL Oral BID BM  . ferrous sulfate  650 mg Oral Q breakfast  . Influenza vac split quadrivalent PF  0.5 mL Intramuscular Tomorrow-1000  . insulin aspart  0-20 Units Subcutaneous TID WC  . pneumococcal 13-valent conjugate vaccine  0.5 mL Intramuscular Tomorrow-1000  . predniSONE  80 mg Oral QAC breakfast   Continuous Infusions: . dextrose 5 % and 0.45% NaCl Stopped (06/07/15 1431)    PRN Meds:.albuterol, HYDROcodone-acetaminophen, ondansetron, promethazine  Antibiotics    Anti-infectives    Start     Dose/Rate Route Frequency Ordered Stop   06/04/15 1200  metroNIDAZOLE (FLAGYL) IVPB 500 mg  Status:  Discontinued     500 mg 100 mL/hr over 60 Minutes Intravenous Every 8 hours 06/04/15 1034 06/07/15 1405   06/04/15 0015  cefTRIAXone (ROCEPHIN) 1 g in dextrose 5 % 50 mL IVPB     1 g 100 mL/hr over 30 Minutes Intravenous  Once 06/04/15 0012 06/04/15 0108       Subjective:   Feels a little better  no sob N mild Had a cheese sandwich today 2 episodes diarrhea today No cp Overall improving she feels Objective:   Filed Vitals:   06/07/15 1400 06/07/15 2121 06/08/15 0510 06/08/15 1300  BP: 111/65 112/70 114/68 106/53  Pulse: 92 95 92 92  Temp: 98.4 F (36.9 C) 98.9 F (37.2 C) 98.7 F (37.1 C) 98.3 F (36.8 C)  TempSrc:  Oral  Oral  Resp: 16 16 16 16   Height:      Weight:      SpO2: 96% 96% 96% 96%    Wt Readings from Last 3 Encounters:  06/03/15 167.009 kg (368 lb 3 oz)  05/25/15 172.367 kg (380 lb)  04/25/15 185.975 kg (410 lb)     Intake/Output Summary (Last 24 hours) at 06/08/15 1529 Last data filed at 06/08/15 1300  Gross per 24 hour  Intake    660 ml  Output      0 ml  Net    660 ml    Exam  General: Well  nourished, NAD  HEENT: NCAT,  mucous membranes moist.   Cardiovascular: S1 S2 auscultated, tachycardic, no murmurs.  Respiratory: Clear to auscultation   Abdomen: Soft, obese, Mildly TTP, nondistended, + bowel sounds, no rebound or guarding  Extremities: warm dry without cyanosis clubbing or edema  Neuro: AAOx3, nonfocal  Psych: appropriate mood and affect  Data Review   Micro Results Recent Results (from the past 240 hour(s))  Urine culture     Status: None   Collection Time: 06/03/15 11:36 PM  Result Value Ref Range Status   Specimen Description URINE, CATHETERIZED  Final   Special Requests NONE  Final    Culture NO GROWTH 1 DAY  Final   Report Status 06/05/2015 FINAL  Final  C difficile quick scan w PCR reflex     Status: None   Collection Time: 06/06/15  8:59 AM  Result Value Ref Range Status   C Diff antigen NEGATIVE NEGATIVE Final   C Diff toxin NEGATIVE NEGATIVE Final   C Diff interpretation Negative for toxigenic C. difficile  Final    Radiology Reports Ct Abdomen Pelvis W Contrast  06/04/2015   CLINICAL DATA:  Nausea, vomiting, diarrhea, and fever for 3 weeks. Diffuse abdominal pain more in the right lower quadrant.  EXAM: CT ABDOMEN AND PELVIS WITH CONTRAST  TECHNIQUE: Multidetector CT imaging of the abdomen and pelvis was performed using the standard protocol following bolus administration of intravenous contrast.  CONTRAST:  144m OMNIPAQUE IOHEXOL 300 MG/ML  SOLN  COMPARISON:  [place] Lung bases are clear.  Diffuse fatty infiltration of the  liver. Gallbladder, spleen, pancreas, adrenal glands, kidneys, abdominal aorta, inferior vena cava, and retroperitoneal lymph nodes are unremarkable. Stomach and small bowel are decompressed. Contrast material is present in the colon suggesting no evidence of small bowel obstruction. There is diffuse colonic wall thickening and diffuse infiltration in the pericolonic fat extending from the splenic flexure along the descending colon and to the sigmoid colon. This is consistent with colitis and could represent inflammatory or infectious etiologies. The amount of pericolonic infiltration is increasing since the previous study, vertically around the sigmoid region. No free air or free fluid in the abdomen. Abdominal wall musculature appears intact.  Pelvis: Bladder wall is not thickened. Uterus and ovaries are not enlarged. Appendix is normal. No pelvic mass or lymphadenopathy. No destructive bone lesions.  IMPRESSION: Wall thickening and prominent pericolonic infiltration around the descending and sigmoid colon consistent  with regional colitis. Inflammatory reaction is increasing since previous study. No discrete abscess identified.   Electronically Signed   By: Lucienne Capers M.D.   On: 06/04/2015 04:35   Ct Abdomen Pelvis W Contrast  05/25/2015   CLINICAL DATA:  Abdominal pain radiating to the left side for several months. Nausea and vomiting beginning in July.  EXAM: CT ABDOMEN AND PELVIS WITH CONTRAST  TECHNIQUE: Multidetector CT imaging of the abdomen and pelvis was performed using the standard protocol following bolus administration of intravenous contrast.  CONTRAST:  100 mL OMNIPAQUE IOHEXOL 300 MG/ML  SOLN  COMPARISON:  CT abdomen and pelvis 05/20/2015 and 11/17/2014.  FINDINGS: The lung bases are clear. No pleural or pericardial effusion. Heart size is mildly enlarged.  As seen on the prior examination, there is wall thickening with surrounding stranding of the descending colon to the sigmoid. The appearance is not markedly changed. Prominent stool burden in the ascending and transverse colon is noted. The appendix is unremarkable. The stomach and small bowel appear normal.  There is fatty infiltration of the liver. The spleen, adrenal glands, pancreas and kidneys appear normal. No lymphadenopathy or fluid is identified. Uterus, adnexa and urinary bladder are unremarkable.  Image bones are unremarkable.  IMPRESSION: Findings consistent with colitis of the descending colon, not markedly changed compare to the patient's recent CT abdomen and pelvis. No new abnormality.  Fatty infiltration of the liver.  Cardiomegaly.   Electronically Signed   By: Inge Rise M.D.   On: 05/25/2015 16:13   Ct Abdomen Pelvis W Contrast  05/20/2015   CLINICAL DATA:  Left lower quadrant abdominal pain. History of diverticulitis.  EXAM: CT ABDOMEN AND PELVIS WITH CONTRAST  TECHNIQUE: Multidetector CT imaging of the abdomen and pelvis was performed using the standard protocol following bolus administration of intravenous contrast.   CONTRAST:  160m OMNIPAQUE IOHEXOL 300 MG/ML SOLN, 591mOMNIPAQUE IOHEXOL 300 MG/ML SOLN  COMPARISON:  None.  FINDINGS: Lung bases are clear. No effusions. Heart is normal size.  Mild diffuse fatty infiltration of the liver. No focal abnormality. Gallbladder, spleen, pancreas, adrenals and kidneys are unremarkable.  There is marked wall thickening and surrounding inflammatory changes around the entire descending colon and proximal sigmoid colon. Findings most compatible with colitis. No fluid collection. No free air. Small bowel and stomach are unremarkable. Moderate stool burden throughout the colon.  Uterus, adnexae and urinary bladder are unremarkable. No free fluid, free air or adenopathy. There are mildly prominent pericolonic lymph nodes adjacent to the rectum and sigmoid colon, presumably reactive. Aorta is normal caliber.  No acute bony abnormality or focal bone lesion.  IMPRESSION:  Marked colonic wall thickening and surrounding inflammatory change involving the entire descending colon and proximal sigmoid colon compatible with colitis.  Moderate stool burden throughout the colon.  Fatty liver.   Electronically Signed   By: Rolm Baptise M.D.   On: 05/20/2015 19:07    CBC  Recent Labs Lab 06/03/15 1840 06/05/15 1455 06/06/15 1121 06/07/15 0448 06/08/15 0535  WBC 6.6 7.6 6.8 13.0* 12.5*  HGB 10.2* 9.6* 8.7* 8.8* 8.5*  HCT 33.8* 31.0* 29.6* 29.0* 28.2*  PLT 498* 388 386 379 394  MCV 61.3* 60.7* 60.9* 60.8* 61.2*  MCH 18.5* 18.8* 17.9* 18.4* 18.4*  MCHC 30.2 31.0 29.4* 30.3 30.1  RDW 21.5* 21.5* 21.3* 21.6* 22.0*    Chemistries   Recent Labs Lab 06/03/15 1840 06/05/15 1455 06/06/15 1121 06/07/15 0448 06/08/15 0535  NA 137 135 133* 135 136  K 3.7 3.4* 3.2* 3.6 4.1  CL 102 102 100* 104 105  CO2 24 26 27 23 26   GLUCOSE 84 98 103* 149* 116*  BUN <5* 7 6 <5* <5*  CREATININE 0.72 0.65 0.57 0.50 0.50  CALCIUM 8.4* 8.0* 7.7* 7.8* 8.1*  AST 22  --   --   --  20  ALT 15  --   --    --  13*  ALKPHOS 79  --   --   --  79  BILITOT 0.9  --   --   --  0.6   ------------------------------------------------------------------------------------------------------------------ estimated creatinine clearance is 150.1 mL/min (by C-G formula based on Cr of 0.5). ------------------------------------------------------------------------------------------------------------------ No results for input(s): HGBA1C in the last 72 hours. ------------------------------------------------------------------------------------------------------------------ No results for input(s): CHOL, HDL, LDLCALC, TRIG, CHOLHDL, LDLDIRECT in the last 72 hours. ------------------------------------------------------------------------------------------------------------------ No results for input(s): TSH, T4TOTAL, T3FREE, THYROIDAB in the last 72 hours.  Invalid input(s): FREET3 ------------------------------------------------------------------------------------------------------------------  Recent Labs  06/07/15 0940  VITAMINB12 1837*  FOLATE 5.4*  FERRITIN 8*  TIBC 216*  IRON 11*  RETICCTPCT 1.4    Coagulation profile No results for input(s): INR, PROTIME in the last 168 hours.  No results for input(s): DDIMER in the last 72 hours.  Cardiac Enzymes No results for input(s): CKMB, TROPONINI, MYOGLOBIN in the last 168 hours.  Invalid input(s): CK ------------------------------------------------------------------------------------------------------------------ Invalid input(s): POCBNP    Verneita Griffes, MD [facility] [phone]

## 2015-06-08 NOTE — Progress Notes (Signed)
Feels better. Eating small amounts of food, and tolerating it. Abdomen definitely less tender to exam. QuantiFERON pending.  Impression: Initial favorable response to steroid therapy  Plan: Consider initiation of Remicade prior to discharge. The patient will have vascular access issues. She may also have transportation difficulties which would make it difficult for her to get to [facility] for further evaluation prior to initiation of therapy.  Cleotis Nipper, M.D. Pager [phone] If no answer or after 5 PM call [phone]

## 2015-06-09 ENCOUNTER — Inpatient Hospital Stay (HOSPITAL_COMMUNITY): Payer: Medicaid Other | Admitting: Anesthesiology

## 2015-06-09 ENCOUNTER — Encounter (HOSPITAL_COMMUNITY)
Admission: EM | Disposition: A | Discharge status: Rehabilitation Facility | Payer: Self-pay | Source: Home / Self Care | Attending: Emergency Medicine

## 2015-06-09 ENCOUNTER — Inpatient Hospital Stay (HOSPITAL_COMMUNITY): Payer: Medicaid Other

## 2015-06-09 DIAGNOSIS — K529 Noninfective gastroenteritis and colitis, unspecified: Secondary | ICD-10-CM

## 2015-06-09 HISTORY — PX: LAPAROTOMY: SHX154

## 2015-06-09 LAB — CBC WITH DIFFERENTIAL/PLATELET
BASOS ABS: 0 10*3/uL (ref 0.0–0.1)
Basophils Relative: 0 %
EOS ABS: 0 10*3/uL (ref 0.0–0.7)
Eosinophils Relative: 0 %
HCT: 34.3 % — ABNORMAL LOW (ref 36.0–46.0)
HEMOGLOBIN: 10.4 g/dL — AB (ref 12.0–15.0)
LYMPHS ABS: 0.6 10*3/uL — AB (ref 0.7–4.0)
LYMPHS PCT: 3 %
MCH: 18.6 pg — ABNORMAL LOW (ref 26.0–34.0)
MCHC: 30.3 g/dL (ref 30.0–36.0)
MCV: 61.5 fL — ABNORMAL LOW (ref 78.0–100.0)
Monocytes Absolute: 0.6 10*3/uL (ref 0.1–1.0)
Monocytes Relative: 3 %
NEUTROS ABS: 20 10*3/uL — AB (ref 1.7–7.7)
Neutrophils Relative %: 94 %
Platelets: 445 10*3/uL — ABNORMAL HIGH (ref 150–400)
RBC: 5.58 MIL/uL — ABNORMAL HIGH (ref 3.87–5.11)
RDW: 22.8 % — AB (ref 11.5–15.5)
WBC: 21.2 10*3/uL — AB (ref 4.0–10.5)

## 2015-06-09 LAB — TRIGLYCERIDES: Triglycerides: 109 mg/dL (ref <150)

## 2015-06-09 LAB — COMPREHENSIVE METABOLIC PANEL
ALBUMIN: 2.1 g/dL — AB (ref 3.5–5.0)
ALK PHOS: 95 U/L (ref 38–126)
ALT: 17 U/L (ref 14–54)
ANION GAP: 11 (ref 5–15)
AST: 44 U/L — ABNORMAL HIGH (ref 15–41)
BILIRUBIN TOTAL: 0.6 mg/dL (ref 0.3–1.2)
BUN: 9 mg/dL (ref 6–20)
CALCIUM: 8.3 mg/dL — AB (ref 8.9–10.3)
CO2: 21 mmol/L — ABNORMAL LOW (ref 22–32)
Chloride: 101 mmol/L (ref 101–111)
Creatinine, Ser: 1.09 mg/dL — ABNORMAL HIGH (ref 0.44–1.00)
GFR calc Af Amer: >60 mL/min (ref >60)
GLUCOSE: 112 mg/dL — AB (ref 65–99)
Potassium: 4.1 mmol/L (ref 3.5–5.1)
Sodium: 133 mmol/L — ABNORMAL LOW (ref 135–145)
TOTAL PROTEIN: 6.3 g/dL — AB (ref 6.5–8.1)

## 2015-06-09 LAB — GLUCOSE, CAPILLARY
GLUCOSE-CAPILLARY: 97 mg/dL (ref 65–99)
GLUCOSE-CAPILLARY: 103 mg/dL — AB (ref 65–99)

## 2015-06-09 LAB — ABO/RH: ABO/RH(D): O POS

## 2015-06-09 LAB — LIPASE, BLOOD: Lipase: 10 U/L — ABNORMAL LOW (ref 22–51)

## 2015-06-09 LAB — PREPARE RBC (CROSSMATCH)

## 2015-06-09 SURGERY — LAPAROTOMY, EXPLORATORY
Anesthesia: General | Site: Abdomen

## 2015-06-09 MED ORDER — ANTISEPTIC ORAL RINSE SOLUTION (CORINZ)
7.0000 mL | Freq: Four times a day (QID) | OROMUCOSAL | Status: DC
  Administered 2015-06-10 – 2015-06-20 (×33): 7 mL via OROMUCOSAL

## 2015-06-09 MED ORDER — ROCURONIUM BROMIDE 100 MG/10ML IV SOLN
INTRAVENOUS | Status: DC | PRN
  Administered 2015-06-09: 20 mg via INTRAVENOUS
  Administered 2015-06-09: 30 mg via INTRAVENOUS
  Administered 2015-06-09: 50 mg via INTRAVENOUS

## 2015-06-09 MED ORDER — IOHEXOL 350 MG/ML SOLN
100.0000 mL | Freq: Once | INTRAVENOUS | Status: AC | PRN
  Administered 2015-06-09: 100 mL via INTRAVENOUS

## 2015-06-09 MED ORDER — FENTANYL CITRATE (PF) 250 MCG/5ML IJ SOLN
INTRAMUSCULAR | Status: AC
  Filled 2015-06-09: qty 5

## 2015-06-09 MED ORDER — PANTOPRAZOLE SODIUM 40 MG IV SOLR
40.0000 mg | Freq: Every day | INTRAVENOUS | Status: DC
  Administered 2015-06-10 – 2015-06-19 (×10): 40 mg via INTRAVENOUS
  Filled 2015-06-09 (×13): qty 40

## 2015-06-09 MED ORDER — MIDAZOLAM HCL 2 MG/2ML IJ SOLN
INTRAMUSCULAR | Status: AC
  Filled 2015-06-09: qty 4

## 2015-06-09 MED ORDER — SUCCINYLCHOLINE 20MG/ML (10ML) SYRINGE FOR MEDFUSION PUMP - OPTIME
INTRAMUSCULAR | Status: DC | PRN
  Administered 2015-06-09: 160 mg via INTRAVENOUS

## 2015-06-09 MED ORDER — PROPOFOL 10 MG/ML IV BOLUS
INTRAVENOUS | Status: DC | PRN
  Administered 2015-06-09: 200 mg via INTRAVENOUS

## 2015-06-09 MED ORDER — MIDAZOLAM HCL 2 MG/2ML IJ SOLN
INTRAMUSCULAR | Status: DC | PRN
  Administered 2015-06-09: 2 mg via INTRAVENOUS

## 2015-06-09 MED ORDER — ROCURONIUM BROMIDE 50 MG/5ML IV SOLN
INTRAVENOUS | Status: AC
  Filled 2015-06-09: qty 1

## 2015-06-09 MED ORDER — FENTANYL CITRATE (PF) 100 MCG/2ML IJ SOLN
50.0000 ug | Freq: Once | INTRAMUSCULAR | Status: AC
  Administered 2015-06-11: 250 ug via INTRAVENOUS

## 2015-06-09 MED ORDER — OXYCODONE-ACETAMINOPHEN 7.5-325 MG PO TABS
2.0000 | ORAL_TABLET | ORAL | Status: DC | PRN
  Administered 2015-06-09: 2 via ORAL
  Filled 2015-06-09: qty 2

## 2015-06-09 MED ORDER — LACTATED RINGERS IV SOLN
INTRAVENOUS | Status: DC | PRN
  Administered 2015-06-09 (×2): via INTRAVENOUS

## 2015-06-09 MED ORDER — SODIUM CHLORIDE 0.9 % IV SOLN
INTRAVENOUS | Status: DC
  Administered 2015-06-09: 23:00:00 via INTRAVENOUS

## 2015-06-09 MED ORDER — PROPOFOL 1000 MG/100ML IV EMUL
0.0000 ug/kg/min | INTRAVENOUS | Status: DC
  Administered 2015-06-09: 40 ug/kg/min via INTRAVENOUS
  Administered 2015-06-10 – 2015-06-11 (×9): 35 ug/kg/min via INTRAVENOUS
  Administered 2015-06-11: 50 ug/kg/min via INTRAVENOUS
  Administered 2015-06-11 – 2015-06-12 (×7): 35 ug/kg/min via INTRAVENOUS
  Filled 2015-06-09 (×21): qty 100

## 2015-06-09 MED ORDER — CIPROFLOXACIN IN D5W 400 MG/200ML IV SOLN
400.0000 mg | Freq: Once | INTRAVENOUS | Status: DC
  Filled 2015-06-09: qty 200

## 2015-06-09 MED ORDER — FENTANYL CITRATE (PF) 250 MCG/5ML IJ SOLN
INTRAMUSCULAR | Status: DC | PRN
  Administered 2015-06-09: 150 ug via INTRAVENOUS
  Administered 2015-06-09: 250 ug via INTRAVENOUS
  Administered 2015-06-09: 150 ug via INTRAVENOUS
  Administered 2015-06-09 (×2): 100 ug via INTRAVENOUS

## 2015-06-09 MED ORDER — CHLORHEXIDINE GLUCONATE 0.12% ORAL RINSE (MEDLINE KIT)
15.0000 mL | Freq: Two times a day (BID) | OROMUCOSAL | Status: DC
  Administered 2015-06-09 – 2015-06-18 (×18): 15 mL via OROMUCOSAL
  Filled 2015-06-09 (×2): qty 15

## 2015-06-09 MED ORDER — MORPHINE SULFATE (PF) 2 MG/ML IV SOLN: 1.0000 mg | INTRAVENOUS | Status: DC | PRN

## 2015-06-09 MED ORDER — CIPROFLOXACIN IN D5W 400 MG/200ML IV SOLN
INTRAVENOUS | Status: AC
  Administered 2015-06-09: 400 mg via INTRAVENOUS
  Filled 2015-06-09: qty 200

## 2015-06-09 MED ORDER — FENTANYL BOLUS VIA INFUSION
50.0000 ug | INTRAVENOUS | Status: DC | PRN
  Administered 2015-06-11: 125 ug via INTRAVENOUS
  Administered 2015-06-11: 100 ug via INTRAVENOUS
  Administered 2015-06-11: 125 ug via INTRAVENOUS
  Filled 2015-06-09 (×4): qty 50

## 2015-06-09 MED ORDER — 0.9 % SODIUM CHLORIDE (POUR BTL) OPTIME
TOPICAL | Status: DC | PRN
  Administered 2015-06-09 (×7): 1000 mL

## 2015-06-09 MED ORDER — SODIUM CHLORIDE 0.9 % IV SOLN: 10.0000 mL/h | Freq: Once | INTRAVENOUS | Status: DC

## 2015-06-09 MED ORDER — SODIUM CHLORIDE 0.9 % IJ SOLN
INTRAMUSCULAR | Status: AC
  Filled 2015-06-09: qty 20

## 2015-06-09 MED ORDER — LACTATED RINGERS IV SOLN
INTRAVENOUS | Status: DC | PRN
  Administered 2015-06-09: 19:00:00 via INTRAVENOUS

## 2015-06-09 MED ORDER — LIDOCAINE HCL (CARDIAC) 20 MG/ML IV SOLN
INTRAVENOUS | Status: AC
  Filled 2015-06-09: qty 5

## 2015-06-09 MED ORDER — SODIUM CHLORIDE 0.9 % IV SOLN
25.0000 ug/h | INTRAVENOUS | Status: DC
  Administered 2015-06-09: 50 ug/h via INTRAVENOUS
  Administered 2015-06-10: 100 ug/h via INTRAVENOUS
  Administered 2015-06-11: 200 ug/h via INTRAVENOUS
  Administered 2015-06-12: 50 ug/h via INTRAVENOUS
  Administered 2015-06-12: 250 ug/h via INTRAVENOUS
  Administered 2015-06-13 – 2015-06-14 (×2): 100 ug/h via INTRAVENOUS
  Filled 2015-06-09 (×7): qty 50

## 2015-06-09 MED ORDER — PROPOFOL 10 MG/ML IV BOLUS
INTRAVENOUS | Status: AC
  Filled 2015-06-09: qty 20

## 2015-06-09 MED ORDER — LIDOCAINE HCL (CARDIAC) 20 MG/ML IV SOLN
INTRAVENOUS | Status: DC | PRN
  Administered 2015-06-09: 5 mL via INTRAVENOUS

## 2015-06-09 MED ORDER — METRONIDAZOLE IN NACL 5-0.79 MG/ML-% IV SOLN
500.0000 mg | Freq: Three times a day (TID) | INTRAVENOUS | Status: AC
  Administered 2015-06-09 – 2015-06-16 (×22): 500 mg via INTRAVENOUS
  Filled 2015-06-09 (×22): qty 100

## 2015-06-09 MED ORDER — PROPOFOL INFUSION 10 MG/ML OPTIME
INTRAVENOUS | Status: DC | PRN
  Administered 2015-06-09: 50 ug/kg/min via INTRAVENOUS

## 2015-06-09 SURGICAL SUPPLY — 54 items
BENZOIN TINCTURE PRP APPL 2/3 (GAUZE/BANDAGES/DRESSINGS) ×6 IMPLANT
BLADE SURG ROTATE 9660 (MISCELLANEOUS) IMPLANT
CANISTER SUCTION 2500CC (MISCELLANEOUS) ×3 IMPLANT
CANISTER WOUND CARE 500ML ATS (WOUND CARE) ×3 IMPLANT
CHLORAPREP W/TINT 26ML (MISCELLANEOUS) ×3 IMPLANT
COVER MAYO STAND STRL (DRAPES) IMPLANT
COVER SURGICAL LIGHT HANDLE (MISCELLANEOUS) ×3 IMPLANT
DRAPE LAPAROSCOPIC ABDOMINAL (DRAPES) ×3 IMPLANT
DRAPE PROXIMA HALF (DRAPES) IMPLANT
DRAPE UTILITY XL STRL (DRAPES) ×6 IMPLANT
DRAPE WARM FLUID 44X44 (DRAPE) ×3 IMPLANT
DRSG OPSITE POSTOP 4X10 (GAUZE/BANDAGES/DRESSINGS) IMPLANT
DRSG OPSITE POSTOP 4X8 (GAUZE/BANDAGES/DRESSINGS) IMPLANT
ELECT BLADE 6.5 EXT (BLADE) IMPLANT
ELECT CAUTERY BLADE 6.4 (BLADE) ×6 IMPLANT
ELECT REM PT RETURN 9FT ADLT (ELECTROSURGICAL) ×3
ELECTRODE REM PT RTRN 9FT ADLT (ELECTROSURGICAL) ×1 IMPLANT
GLOVE BIO SURGEON STRL SZ8 (GLOVE) ×3 IMPLANT
GLOVE BIOGEL PI IND STRL 8 (GLOVE) ×1 IMPLANT
GLOVE BIOGEL PI INDICATOR 8 (GLOVE) ×2
GLOVE BIOGEL PI ORTHO PRO SZ7 (GLOVE) ×2
GLOVE PI ORTHO PRO STRL SZ7 (GLOVE) ×1 IMPLANT
GLOVE SURG SS PI 6.5 STRL IVOR (GLOVE) ×3 IMPLANT
GOWN STRL REUS W/ TWL LRG LVL3 (GOWN DISPOSABLE) ×2 IMPLANT
GOWN STRL REUS W/ TWL XL LVL3 (GOWN DISPOSABLE) ×1 IMPLANT
GOWN STRL REUS W/TWL LRG LVL3 (GOWN DISPOSABLE) ×4
GOWN STRL REUS W/TWL XL LVL3 (GOWN DISPOSABLE) ×2
KIT BASIN OR (CUSTOM PROCEDURE TRAY) ×3 IMPLANT
KIT ROOM TURNOVER OR (KITS) ×3 IMPLANT
LIGASURE IMPACT 36 18CM CVD LR (INSTRUMENTS) IMPLANT
NS IRRIG 1000ML POUR BTL (IV SOLUTION) ×21 IMPLANT
PACK GENERAL/GYN (CUSTOM PROCEDURE TRAY) ×3 IMPLANT
PAD ARMBOARD 7.5X6 YLW CONV (MISCELLANEOUS) ×3 IMPLANT
PENCIL BUTTON HOLSTER BLD 10FT (ELECTRODE) IMPLANT
SPECIMEN JAR LARGE (MISCELLANEOUS) ×3 IMPLANT
SPONGE ABDOMINAL VAC ABTHERA (MISCELLANEOUS) ×3 IMPLANT
SPONGE LAP 18X18 X RAY DECT (DISPOSABLE) ×12 IMPLANT
STAPLER CUT CVD 40MM GREEN (STAPLE) ×3 IMPLANT
STAPLER CUT RELOAD GREEN (STAPLE) ×3 IMPLANT
STAPLER VISISTAT 35W (STAPLE) ×3 IMPLANT
SUCTION POOLE TIP (SUCTIONS) ×3 IMPLANT
SUT PDS AB 1 TP1 96 (SUTURE) ×6 IMPLANT
SUT PROLENE 2 0 CT2 30 (SUTURE) ×6 IMPLANT
SUT SILK 2 0 SH CR/8 (SUTURE) ×3 IMPLANT
SUT SILK 2 0 TIES 10X30 (SUTURE) ×3 IMPLANT
SUT SILK 3 0 SH CR/8 (SUTURE) ×3 IMPLANT
SUT SILK 3 0 TIES 10X30 (SUTURE) ×3 IMPLANT
SWAB COLLECTION DEVICE MRSA (MISCELLANEOUS) ×3 IMPLANT
TOWEL OR 17X26 10 PK STRL BLUE (TOWEL DISPOSABLE) ×3 IMPLANT
TRAY FOLEY CATH 16FRSI W/METER (SET/KITS/TRAYS/PACK) ×3 IMPLANT
TUBE ANAEROBIC SPECIMEN COL (MISCELLANEOUS) ×3 IMPLANT
TUBE CONNECTING 12'X1/4 (SUCTIONS)
TUBE CONNECTING 12X1/4 (SUCTIONS) IMPLANT
YANKAUER SUCT BULB TIP NO VENT (SUCTIONS) IMPLANT

## 2015-06-09 NOTE — Anesthesia Postprocedure Evaluation (Signed)
  Anesthesia Post-op Note  Patient: Dominique [name]  Procedure(s) Performed: Procedure(s): EXPLORATORY LAPAROTOMY, DRAINAGE OF INTRAABDOMINAL ABSCESS, PARTIAL COLON RESECTION,  APPLICATION OF WOUND VAC (N/A)  Patient Location: SICU  Anesthesia Type:General  Level of Consciousness: sedated and Patient remains intubated per anesthesia plan  Airway and Oxygen Therapy: Patient remains intubated per anesthesia plan  Post-op Pain: none  Post-op Assessment: Post-op Vital signs reviewed, Patient's Cardiovascular Status Stable and Respiratory Function Stable              Post-op Vital Signs: Reviewed and stable  Last Vitals:  Filed Vitals:   06/09/15 2215  BP:   Pulse: 86  Temp:   Resp: 15    Complications: No apparent anesthesia complications

## 2015-06-09 NOTE — Progress Notes (Signed)
Patient was seen by Dr. Grandville Silos and he explained that she was going to need emergency surgery.  Patient became upset and after their discussion she called her mother, Dominique [name].  I explained to the mother, with the patient's permission, that she would be going to surgery and that I would give the surgery area her number so that they can call her once the surgery is over.  This was satisfactory for the patient and she signed the note so that this could be done once she was out of surgery.

## 2015-06-09 NOTE — Op Note (Signed)
06/03/2015 - 06/09/2015  9:30 PM  PATIENT:  Dominique [name]  34 y.o. female  PRE-OPERATIVE DIAGNOSIS:  PERFORATED VISCOUS  POST-OPERATIVE DIAGNOSIS:  PERFORATED DESCENDING COLON, COLITIS DESCENDING AND SIGMOID COLON  PROCEDURE:  Procedure(s): EXPLORATORY LAPAROTOMY, DRAINAGE OF INTRA-ABDOMINAL ABSCESS, PARTIAL COLON RESECTION,  APPLICATION OF WOUND VAC  SURGEON:  Surgeon(s): Georganna Skeans, MD  ASSISTANTS: Jackolyn Confer, M.D.   ANESTHESIA:   general  EBL:  Total I/O In: 2000 [I.V.:2000] Out: 600 [Urine:200; Blood:400]  BLOOD ADMINISTERED:none  DRAINS: OPEN ABDOMEN VAC  SPECIMEN:  Excision  DISPOSITION OF SPECIMEN:  PATHOLOGY  COUNTS:  YES  DICTATION: .Dragon Dictation Findings: 4 mm perforation descending colon with severe colitis of descending colon and sigmoid colon  [name] is brought for emergency exploration. Informed consent was obtained. She received intravenous Cipro and Flagyl. She was brought to the operating room and general endotracheal anesthesia was administered by the anesthesia staff. Anesthesia placed a central line. Foley catheterby nursing. Abdomen was prepped and draped in a sterile fashion. Time out procedure was performed. Midline incision was made and subcutaneous tissues were dissected down revealing the anterior fascia. This was divided sharply along the midline. Peritoneal cavity was carefully entered under direct vision. The fascia was gradually opened as we took down some omental adhesions to the inside of the anterior abdominal wall. Once these were swept away reexplored the left lower quadrant and there was a collection of pus consistent with abscess. The descending and sigmoid colon were very thickened. We identified a perforation in the descending colon. Descending colon was mobilized from lateral peritoneal attachments. We mobilized the proximal sigmoid as well. We then identified an area of soft more distal sigmoid which was noninflamed. This was  divided with the contour stapler. The mesentery of the colon was then carefully taken down using both clamps and suture ligatures as well as the LigaSure. We found an area of colon distal to the splenic flexure which was also soft and above the area of acute inflammation. The colon was divided there again with the contour stapler. The intervening mesentery was taken down right along the edge of the bowel staying away from the ureter. The specimen was marked for pathology and passed off. Hemostasis was ensured along the mesenteric resection. The abdomen was irrigated with multiple liters of warm saline. Gastric tube was confirmed in position. Decision was made to use an open abdomen VAC and bring her back for further exploration and possible colostomy formation in 36-72 hours depending how she is doing clinically. The proximal and distal stapled portions of the colon were marked with 2-0 Prolene sutures. Large fenestrated open abdomen VAC was tucked around her bowel. Skin was prepped. The remainder of the open abdomen VAC was applied in standard technique and hooked up to suction. Excellent seal was obtained. All counts were correct. She tolerated the procedure without apparent complication. She was taken directly to the intensive care unit in critical condition on the ventilator. I have consulted critical care medicine to assist in her care.   PATIENT DISPOSITION:  ICU - intubated and critically ill.   Delay start of Pharmacological VTE agent (>24hrs) due to surgical blood loss or risk of bleeding:  no  Georganna Skeans, MD, MPH, FACS Pager: [phone]  9/16/20169:30 PM

## 2015-06-09 NOTE — Progress Notes (Signed)
Patient's labs reviewed.  Lipase normal, no evidence of pancreatitis.  Slight elevation of BUN and creatinine, suggesting volume contraction/dehydration. Presumably, this patient is unable to take enough orally to maintain adequate hydration.  Elevated white count, hemoglobin, and platelets could reflect stress marrow and/or volume contraction.  Have discussed with Dr. Verlon Au. We feel that a PICC line will probably be needed, because this patient's initial apparent "rapid turnaround" on steroid therapy is evolving more into a smoldering, prolonged illness.  Dominique [name], M.D. Pager [phone] If no answer or after 5 PM call [phone]

## 2015-06-09 NOTE — Anesthesia Procedure Notes (Addendum)
Anesthesia Procedure Note The patient was identified and consent obtained.  TO was performed, and full barrier precautions were used.  Once the vein was located with the 22 ga., the wire was inserted into the vein. The insertion site was dilated and the CVL was carefully inserted and sutured in place. The patient tolerated the procedure well.  CXR was ordered for PACU. Ultrasound guidance: relevant anatomy identified, needle position confirmed, vessel patent, wire seen inside the vessel.  Images printed for the medical record.  Williams Che, MD  Procedure Name: Intubation Date/Time: 06/09/2015 7:47 PM Performed by: Valetta Fuller Pre-anesthesia Checklist: Patient identified, Emergency Drugs available, Suction available and Patient being monitored Patient Re-evaluated:Patient Re-evaluated prior to inductionOxygen Delivery Method: Circle system utilized Preoxygenation: Pre-oxygenation with 100% oxygen Intubation Type: IV induction and Rapid sequence Laryngoscope Size: Miller and 2 Grade View: Grade I Tube type: Subglottic suction tube Number of attempts: 1 Airway Equipment and Method: Stylet Placement Confirmation: ETT inserted through vocal cords under direct vision,  positive ETCO2 and breath sounds checked- equal and bilateral Secured at: 22 cm Tube secured with: Tape Dental Injury: Teeth and Oropharynx as per pre-operative assessment

## 2015-06-09 NOTE — Consult Note (Signed)
Reason for Consult:free air Referring Physician: Alishah Schulte is an 34 y.o. female.  HPI: Dominique [name] has had recurrent colitis since January of this year. She was admitted 2 days ago with abdominal pain again. She has been undergoing a thorough workup by Dr. Ronald Lobo from [facility] gastroenterology. She reportedly underwent sigmoidoscopy with biopsies and was diagnosed with Crohn disease. She has been treated medically and she became much worse today with more left-sided abdominal pain. She had some other symptoms so the hospitalist service obtained a CT angiogram/ PE study of her chest. This reveals free intraperitoneal air and I was asked to see her in consultation. She complains of significant abdominal pain.  Past Medical History  Diagnosis Date  . Asthma   . Sleep apnea   . Anemia   . Morbid obesity 03/22/2009    Qualifier: Diagnosis of  By: Ronnald Ramp CMA, [place]    . OSA (obstructive sleep apnea) 05/02/2014  . Thyromegaly 05/02/2014  . Seasonal allergies 06/13/2014  . Acute acalculous cholecystitis 11/16/2014  . Diverticulitis of colon 11/17/2014  . Hepatic steatosis 11/17/2014    Past Surgical History  Procedure Laterality Date  . C section 2011  2011  . Flexible sigmoidoscopy N/A 06/05/2015    Procedure: FLEXIBLE SIGMOIDOSCOPY;  Surgeon: Ronald Lobo, MD;  Location: [place];  Service: Endoscopy;  Laterality: N/A;    Family History  Problem Relation Age of Onset  . Hypertension Mother   . Hypertension Father   . Hypertension Sister   . Asthma Brother   . Diabetes Maternal Grandmother   . Hypertension Maternal Grandmother   . Hypertension Maternal Grandfather   . Cancer Paternal Grandmother     lung  . Emphysema Paternal Grandfather   . Allergies Mother     Social History:  reports that she has quit smoking. Her smoking use included Cigarettes. She started smoking about 10 months ago. She has a 2 pack-year smoking history. She has never used smokeless tobacco. She  reports that she does not drink alcohol or use illicit drugs.  Allergies:  Allergies  Allergen Reactions  . Other Shortness Of Breath and Swelling    Tree nuts  . Penicillins     Unknown childhood allergy    Medications:  Scheduled: . cholestyramine  4 g Oral QAC breakfast  . ciprofloxacin  400 mg Intravenous Once  . enoxaparin (LOVENOX) injection  80 mg Subcutaneous Q24H  . feeding supplement (ENSURE ENLIVE)  237 mL Oral BID BM  . ferrous sulfate  650 mg Oral Q breakfast  . Influenza vac split quadrivalent PF  0.5 mL Intramuscular Tomorrow-1000  . insulin aspart  0-20 Units Subcutaneous TID WC  . metronidazole  500 mg Intravenous Q8H  . pneumococcal 13-valent conjugate vaccine  0.5 mL Intramuscular Tomorrow-1000  . predniSONE  80 mg Oral QAC breakfast   Continuous: . sodium chloride    . dextrose 5 % and 0.45% NaCl Stopped (06/07/15 1431)   ONG:EXBMWUXLK, morphine injection, ondansetron, oxyCODONE-acetaminophen, promethazine  Results for orders placed or performed during the hospital encounter of 06/03/15 (from the past 48 hour(s))  Glucose, capillary     Status: Abnormal   Collection Time: 06/07/15  9:44 PM  Result Value Ref Range   Glucose-Capillary 113 (H) 65 - 99 mg/dL   Comment 1 Notify RN   CBC     Status: Abnormal   Collection Time: 06/08/15  5:35 AM  Result Value Ref Range   WBC 12.5 (H) 4.0 - 10.5 K/uL  RBC 4.61 3.87 - 5.11 MIL/uL   Hemoglobin 8.5 (L) 12.0 - 15.0 g/dL   HCT 28.2 (L) 36.0 - 46.0 %   MCV 61.2 (L) 78.0 - 100.0 fL   MCH 18.4 (L) 26.0 - 34.0 pg   MCHC 30.1 30.0 - 36.0 g/dL   RDW 22.0 (H) 11.5 - 15.5 %   Platelets 394 150 - 400 K/uL  Comprehensive metabolic panel     Status: Abnormal   Collection Time: 06/08/15  5:35 AM  Result Value Ref Range   Sodium 136 135 - 145 mmol/L   Potassium 4.1 3.5 - 5.1 mmol/L   Chloride 105 101 - 111 mmol/L   CO2 26 22 - 32 mmol/L   Glucose, Bld 116 (H) 65 - 99 mg/dL   BUN <5 (L) 6 - 20 mg/dL   Creatinine,  Ser 0.50 0.44 - 1.00 mg/dL   Calcium 8.1 (L) 8.9 - 10.3 mg/dL   Total Protein 6.2 (L) 6.5 - 8.1 g/dL   Albumin 2.2 (L) 3.5 - 5.0 g/dL   AST 20 15 - 41 U/L   ALT 13 (L) 14 - 54 U/L   Alkaline Phosphatase 79 38 - 126 U/L   Total Bilirubin 0.6 0.3 - 1.2 mg/dL   GFR calc non Af Amer >60 >60 mL/min   GFR calc Af Amer >60 >60 mL/min    Comment: (NOTE) The eGFR has been calculated using the CKD EPI equation. This calculation has not been validated in all clinical situations. eGFR's persistently <60 mL/min signify possible Chronic Kidney Disease.    Anion gap 5 5 - 15  Glucose, capillary     Status: Abnormal   Collection Time: 06/08/15  6:34 AM  Result Value Ref Range   Glucose-Capillary 123 (H) 65 - 99 mg/dL   Comment 1 Notify RN   Glucose, capillary     Status: Abnormal   Collection Time: 06/08/15 11:19 AM  Result Value Ref Range   Glucose-Capillary 114 (H) 65 - 99 mg/dL   Comment 1 Notify RN   Glucose, capillary     Status: Abnormal   Collection Time: 06/08/15  4:21 PM  Result Value Ref Range   Glucose-Capillary 122 (H) 65 - 99 mg/dL   Comment 1 Notify RN   Glucose, capillary     Status: Abnormal   Collection Time: 06/08/15  8:32 PM  Result Value Ref Range   Glucose-Capillary 110 (H) 65 - 99 mg/dL  Glucose, capillary     Status: None   Collection Time: 06/09/15 11:48 AM  Result Value Ref Range   Glucose-Capillary 97 65 - 99 mg/dL   Comment 1 Repeat Test    Comment 2 Document in Chart   Lipase, blood     Status: Abnormal   Collection Time: 06/09/15  1:25 PM  Result Value Ref Range   Lipase 10 (L) 22 - 51 U/L  Comprehensive metabolic panel     Status: Abnormal   Collection Time: 06/09/15  1:25 PM  Result Value Ref Range   Sodium 133 (L) 135 - 145 mmol/L   Potassium 4.1 3.5 - 5.1 mmol/L   Chloride 101 101 - 111 mmol/L   CO2 21 (L) 22 - 32 mmol/L   Glucose, Bld 112 (H) 65 - 99 mg/dL   BUN 9 6 - 20 mg/dL   Creatinine, Ser 1.09 (H) 0.44 - 1.00 mg/dL   Calcium 8.3 (L) 8.9  - 10.3 mg/dL   Total Protein 6.3 (L) 6.5 - 8.1 g/dL  Albumin 2.1 (L) 3.5 - 5.0 g/dL   AST 44 (H) 15 - 41 U/L   ALT 17 14 - 54 U/L   Alkaline Phosphatase 95 38 - 126 U/L   Total Bilirubin 0.6 0.3 - 1.2 mg/dL   GFR calc non Af Amer >60 >60 mL/min   GFR calc Af Amer >60 >60 mL/min    Comment: (NOTE) The eGFR has been calculated using the CKD EPI equation. This calculation has not been validated in all clinical situations. eGFR's persistently <60 mL/min signify possible Chronic Kidney Disease.    Anion gap 11 5 - 15  CBC with Differential/Platelet     Status: Abnormal   Collection Time: 06/09/15  1:25 PM  Result Value Ref Range   WBC 21.2 (H) 4.0 - 10.5 K/uL   RBC 5.58 (H) 3.87 - 5.11 MIL/uL   Hemoglobin 10.4 (L) 12.0 - 15.0 g/dL   HCT 34.3 (L) 36.0 - 46.0 %   MCV 61.5 (L) 78.0 - 100.0 fL   MCH 18.6 (L) 26.0 - 34.0 pg   MCHC 30.3 30.0 - 36.0 g/dL   RDW 22.8 (H) 11.5 - 15.5 %   Platelets 445 (H) 150 - 400 K/uL    Comment: PLATELET COUNT CONFIRMED BY SMEAR   Neutrophils Relative % 94 %   Lymphocytes Relative 3 %   Monocytes Relative 3 %   Eosinophils Relative 0 %   Basophils Relative 0 %   Neutro Abs 20.0 (H) 1.7 - 7.7 K/uL   Lymphs Abs 0.6 (L) 0.7 - 4.0 K/uL   Monocytes Absolute 0.6 0.1 - 1.0 K/uL   Eosinophils Absolute 0.0 0.0 - 0.7 K/uL   Basophils Absolute 0.0 0.0 - 0.1 K/uL   RBC Morphology POLYCHROMASIA PRESENT     Comment: ELLIPTOCYTES  Glucose, capillary     Status: Abnormal   Collection Time: 06/09/15  4:06 PM  Result Value Ref Range   Glucose-Capillary 103 (H) 65 - 99 mg/dL   Comment 1 Repeat Test    Comment 2 Document in Chart     Ct Angio Chest Pe W/cm &/or Wo Cm  06/09/2015   CLINICAL DATA:  Shortness of breath, abdominal pain, Crohn's disease  EXAM: CT ANGIOGRAPHY CHEST WITH CONTRAST  TECHNIQUE: Multidetector CT imaging of the chest was performed using the standard protocol during bolus administration of intravenous contrast. Multiplanar CT image  reconstructions and MIPs were obtained to evaluate the vascular anatomy.  CONTRAST:  134m OMNIPAQUE IOHEXOL 350 MG/ML SOLN  COMPARISON:  None.  FINDINGS: The study is markedly limited by extensive streak artifacts from patient's large body habitus. No gross central pulmonary embolus is noted. There is fatty infiltration of the liver. No aortic aneurysm. Evaluation of lobar and segmental arterial branches is nondiagnostic for pulmonary embolus.  The visualized upper abdomen shows free air anterior abdomen please see axial image 59 highly suspicious for perforated viscus. Clinical correlation is necessary. Further correlation with CT scan of the abdomen and pelvis is recommended.  There is nodular enlargement of right lobe of thyroid gland measures 4.2 cm. Further correlation with thyroid gland ultrasound is recommended to exclude a mass. There is no mediastinal hematoma or adenopathy. No hilar adenopathy.  Images of the lung parenchyma shows no acute infiltrate or pulmonary edema.  Review of the MIP images confirms the above findings.  IMPRESSION: 1. Significant limited study by patient's large body habitus. No central pulmonary embolus is noted. 2. No acute infiltrate or pulmonary edema. No mediastinal hematoma or adenopathy.  3. There is free abdominal air in upper abdomen highly suspicious for perforated viscus. Clinical correlation is necessary. 4. There is 4.2 cm nodule lesion in right lobe of thyroid gland. Further evaluation with thyroid gland ultrasound is recommended. Critical Value/emergent results were called by telephone at the time of interpretation on 06/09/2015 at 6:02 pm to Dr. Nita Sells , who verbally acknowledged these results.   Electronically Signed   By: Lahoma Crocker M.D.   On: 06/09/2015 18:02    Review of Systems  Constitutional: Positive for malaise/fatigue.  HENT: Negative.   Eyes: Negative.   Respiratory: Negative for cough and wheezing.   Cardiovascular: Negative for chest  pain.  Gastrointestinal: Positive for abdominal pain and blood in stool. Negative for nausea and vomiting.  Genitourinary: Negative.   Musculoskeletal: Negative.   Skin: Negative.   Neurological: Negative.   Endo/Heme/Allergies: Negative.   Psychiatric/Behavioral: Negative.    Blood pressure 104/79, pulse 114, temperature 98.2 F (36.8 C), temperature source Oral, resp. rate 16, height 5' 2"  (1.575 m), weight 167.009 kg (368 lb 3 oz), last menstrual period 05/06/2015, SpO2 98 %. Physical Exam  Constitutional: She is oriented to person, place, and time. She appears well-developed and well-nourished. No distress.  HENT:  Head: Normocephalic and atraumatic.  Right Ear: External ear normal.  Left Ear: External ear normal.  Nose: Nose normal.  Mouth/Throat: Oropharynx is clear and moist.  Eyes: EOM are normal. Pupils are equal, round, and reactive to light.  Neck: Normal range of motion. Neck supple.  Cardiovascular: Normal rate and normal heart sounds.   Respiratory: Effort normal and breath sounds normal. No respiratory distress. She has no wheezes. She has no rales.  GI: She exhibits distension. There is tenderness. There is rebound and guarding.  Mild distention, left-sided tenderness with peritonitis, quiet  Neurological: She is alert and oriented to person, place, and time.  Skin: Skin is warm.  Psychiatric:  Tearful during discussion    Assessment/Plan: Perforated viscus, likely perforated left colon from Crohn's colitis. We'll give IV antibiotics and taken emergently to the operative room for exploratory laparotomy. She will likely require colectomy and a staged procedure with initial application of open abdomen VAC. Her morbid obesity and history of obstructive sleep apnea will make it difficult to wean her from the ventilator once her surgical procedures are completed. We will plan on consulting the critical care medicine service postoperatively. Procedure, risks, and benefits  were discussed in detail with her. She is agreeable.  Morgen Ritacco E 06/09/2015, 6:28 PM

## 2015-06-09 NOTE — Progress Notes (Signed)
Patient just transferred to SICU with no complications. RT increased PEEP to 10 per MD verbal order. Patient in no distress. RT will continue to monitor.

## 2015-06-09 NOTE — Progress Notes (Signed)
GASTROENTEROLOGY PROGRESS NOTE  Problem:   Left-sided Crohn's colitis, newly diagnosed  Subjective: Marked worsening compared to yesterday. Developed significant upper abdominal pain last night, without nausea. Still in pain today. Unable to eat. Has used bathroom, but I don't think there has been any major diarrhea. The patient states it hurts to take a deep breath, but she is not actively short of breath and has been on the VTE prophylaxis.  Objective: The patient is sitting up on the side of the bed, in moderate discomfort but not really acute distress. There is no respiratory distress. The lungs appear to have some delayed expiratory wheezes. The abdomen is moderately tender to palpation, without guarding, but assessment is somewhat limited by the patient's morbid obesity. Vital signs are unremarkable.  QuantiFERON test came back negative.  Assessment: Alteration of status 2 days into steroid therapy, with atypical location of pain (upper abdomen) and pleuritic pain symptoms.  Plan: 1. Obtain stat labs. I wonder if this could be steroid induced pancreatitis. 2. Discuss consideration of CT angiography of the chest to rule out PE. I have placed a message to Dr. Verlon Au. 3. I have provided the patient educational materials pertaining to Crohn's disease, and have discussed attention side effects of anti-TNF therapy, which include increased susceptibility to infections including opportunistic infections, a slight increase in the risk of certain types of rare cancers, and the rare possibility of  demyelinating disease. However, as I explained to the patient, despite all these risks, there really aren't a lot of good options in her case (she would be a poor surgical candidate, for example, in view of her morbid obesity, steroids have long-term side effects, other agents are not likely to be sufficiently potent). Accordingly, I do feel that anti-TNF therapy is probably the best choice for this  patient. 4. I believe we should hold off on initiating anti-TNF therapy while the patient has had an unexplained abrupt change in status from yesterday which is not clearly indicative of worsening of her Crohn's.   Cleotis Nipper, M.D. 06/09/2015 11:57 AM  Pager [phone] If no answer or after 5 PM call [phone]

## 2015-06-09 NOTE — Anesthesia Preprocedure Evaluation (Addendum)
Anesthesia Evaluation  Patient identified by MRN, date of birth, ID band Patient awake    Reviewed: Allergy & Precautions, NPO status , Patient's Chart, lab work & pertinent test results  History of Anesthesia Complications Negative for: history of anesthetic complications  Airway Mallampati: II  TM Distance: >3 FB Neck ROM: Full    Dental  (+) Teeth Intact, Dental Advisory Given   Pulmonary asthma , sleep apnea and Continuous Positive Airway Pressure Ventilation , former smoker,    Pulmonary exam normal breath sounds clear to auscultation       Cardiovascular (-) hypertension(-) angina(-) Past MI negative cardio ROS Normal cardiovascular exam Rhythm:Regular Rate:Normal     Neuro/Psych negative neurological ROS  negative psych ROS   GI/Hepatic Neg liver ROS, Crohn's disease   CT ANGIo: IMPRESSION: 1. Significant limited study by patient's large body habitus. No central pulmonary embolus is noted. 2. No acute infiltrate or pulmonary edema. No mediastinal hematoma or adenopathy. 3. There is free abdominal air in upper abdomen highly suspicious for perforated viscus. Clinical correlation is necessary. 4. There is 4.2 cm nodule lesion in right lobe of thyroid gland. Further evaluation with thyroid gland ultrasound is recommended. Critical Value/emergent results were called by telephone at the time of interpretation on 06/09/2015 at 6:02 pm to Dr. Nita Sells , who verbally acknowledged these results.   Endo/Other  negative endocrine ROS  Renal/GU negative Renal ROS     Musculoskeletal negative musculoskeletal ROS (+)   Abdominal (+) + obese,   Peds  Hematology  (+) Blood dyscrasia, anemia ,   Anesthesia Other Findings Day of surgery medications reviewed with the patient.  Reproductive/Obstetrics                           Anesthesia Physical Anesthesia Plan  ASA: III and  emergent  Anesthesia Plan: General   Post-op Pain Management:    Induction: Intravenous and Rapid sequence  Airway Management Planned: Oral ETT and Video Laryngoscope Planned  Additional Equipment: Arterial line, CVP and Ultrasound Guidance Line Placement  Intra-op Plan:   Post-operative Plan: Post-operative intubation/ventilation  Informed Consent: I have reviewed the patients History and Physical, chart, labs and discussed the procedure including the risks, benefits and alternatives for the proposed anesthesia with the patient or authorized representative who has indicated his/her understanding and acceptance.   Dental advisory given  Plan Discussed with: CRNA  Anesthesia Plan Comments: (Risks/benefits of general anesthesia discussed with patient including risk of damage to teeth, lips, gum, and tongue, nausea/vomiting, allergic reactions to medications, and the possibility of heart attack, stroke and death.  All patient questions answered.  Patient wishes to proceed.)       Anesthesia Quick Evaluation

## 2015-06-09 NOTE — Transfer of Care (Signed)
Immediate Anesthesia Transfer of Care Note  Patient: Dominique [name]  Procedure(s) Performed: Procedure(s): EXPLORATORY LAPAROTOMY, DRAINAGE OF INTRAABDOMINAL ABSCESS, PARTIAL COLON RESECTION,  APPLICATION OF WOUND VAC (N/A)  Patient Location: SICU  Anesthesia Type:General  Level of Consciousness: sedated  Airway & Oxygen Therapy: Patient remains intubated per anesthesia plan and Patient placed on Ventilator (see vital sign flow sheet for setting)  Post-op Assessment: Report given to RN  Post vital signs: Reviewed and stable  Last Vitals:  Filed Vitals:   06/09/15 1400  BP: 104/79  Pulse: 114  Temp: 36.8 C  Resp: 16    Complications: No apparent anesthesia complications

## 2015-06-09 NOTE — Progress Notes (Signed)
34 year old. ? asthma and recurrent colitis since jan 2016 who came to the ED 9/11 with cc of severe abdominal pain. pain started back in Jan and improved only between Feb and May.  Since June she continued to have abdominal pain with diarrhea and occasional vomiting.  She has not been able to see GI for insurance and referral issues until last week.  She said after discussing her last CT abdomen findings with GI, the plan for her was to continue a course of abx and come back but last Friday she started having severe abdominal pain that got more diffuse  ( usually she has lower abdominal pain) associated with vomiting daily for 2-3 times ( non bilious and non bloody) and daily diarrhea ( soft stool with occasional blood0.  She denied fever or chills.  No other complaints.  No chest pain ,  cough or dyspnea.   Cdiff Neg  GI pathogen panel neg quantiferon is negative   Flex Sig 9/12 ENDOSCOPIC IMPRESSION: Severe chronic inflammatory changes in the proximal sigmoid and descending colon, characterized by edema, nodularity, and mucinous exudate. Normal rectum and distal sigmoid. The changes are very nonspecific in their appearance, and do not look like typical Crohn's disease, ulcerative colitis, or ischemia  Subjective:    not doing well today   feels that pain is a lot worse Did not sleep at all last night Cannot take deep breaths because she has pain in her abdomen   Assessment & Plan   Crohns colitis/chronic diarrhea -CT of the abdomen showed regional colitis. -Patient was recently placed on Flagyl as well as ciprofloxacin by gastroenterology as an  - d/c flagyll 9/14 + continue cholestyramine -Gastroenterology consulted and appreciated- flexible sigmoidoscopy=severe chronic inflammatory changes characterized by edema and nodularity. Pathology shows nonnecrotizing granulomata, consistent with active Crohn's colitis -Per Dr. Buccini:?follow up at [facility] in few wks-solumedrol  67m IV BID--this was switched to Prednisone 80 mg as no IV on 9/14 - pathogen panel, C. difficile, quantifying or negative so maybe this is time for Remicade-defer to  GI   I've increased her pain control from Norco to Percocet 1-2 tablets every 4 when necessary moderate to severe pain. -Patient will also need colonoscopy however this will be done at a later time. Back donw from soft diet to Full liquids todat  Asthma -Currently controlled. Continue inhalers as needed.  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 -Hemoglobin currently 9.6, baseline appears to be between 7-8 -Continue to monitor CBC -Continue ferrous sulfate - FOBT likely to be + with active inflammation -transfusion threshold ~ 7.0  Asymptomatic bacteriuria -UA showed 3-6 WBC, small leukocytes, positive nitrites, few bacteria -Patient currently denies any urinary symptoms, burning or odor -Will not treat at this time  Morbid obesity with mod malnutrition -Patient will need to discuss last modifications including diet and exercise with her primary care physician upon discharge -she has lost a significant amount of weight over past month  Tachycardia  -Possibly secondary to pain -TSH 1.714  Code Status: Full  Family Communication: None at bedside  Disposition Plan: Admitted.   Time Spent in minutes   15 minutes  Procedures  Flexible sigmoidoscopy  Consults   Gastroenterology  DVT Prophylaxis  Lovenox  Lab Results  Component Value Date   PLT 394 06/08/2015    Medications  Scheduled Meds: . cholestyramine  4 g Oral QAC breakfast  . enoxaparin (LOVENOX) injection  80 mg Subcutaneous Q24H  . feeding supplement (ENSURE  ENLIVE)  237 mL Oral BID BM  . ferrous sulfate  650 mg Oral Q breakfast  . Influenza vac split quadrivalent PF  0.5 mL Intramuscular Tomorrow-1000  . insulin aspart  0-20 Units Subcutaneous TID WC  . pneumococcal 13-valent conjugate vaccine  0.5 mL Intramuscular Tomorrow-1000    . predniSONE  80 mg Oral QAC breakfast   Continuous Infusions: . dextrose 5 % and 0.45% NaCl Stopped (06/07/15 1431)   PRN Meds:.albuterol, ondansetron, oxyCODONE-acetaminophen, promethazine  Antibiotics    Anti-infectives    Start     Dose/Rate Route Frequency Ordered Stop   06/04/15 1200  metroNIDAZOLE (FLAGYL) IVPB 500 mg  Status:  Discontinued     500 mg 100 mL/hr over 60 Minutes Intravenous Every 8 hours 06/04/15 1034 06/07/15 1405   06/04/15 0015  cefTRIAXone (ROCEPHIN) 1 g in dextrose 5 % 50 mL IVPB     1 g 100 mL/hr over 30 Minutes Intravenous  Once 06/04/15 0012 06/04/15 0108        Objective:   Filed Vitals:   06/08/15 0510 06/08/15 1300 06/08/15 2152 06/09/15 0632  BP: 114/68 106/53 133/73 114/67  Pulse: 92 92 97 92  Temp: 98.7 F (37.1 C) 98.3 F (36.8 C) 97.9 F (36.6 C) 98.1 F (36.7 C)  TempSrc:  Oral Oral Oral  Resp: 16 16 18 16   Height:      Weight:      SpO2: 96% 96% 98% 96%    Wt Readings from Last 3 Encounters:  06/03/15 167.009 kg (368 lb 3 oz)  05/25/15 172.367 kg (380 lb)  04/25/15 185.975 kg (410 lb)     Intake/Output Summary (Last 24 hours) at 06/09/15 1108 Last data filed at 06/08/15 1700  Gross per 24 hour  Intake    480 ml  Output      0 ml  Net    480 ml    Exam  General: Well  nourished, NAD  HEENT: NCAT,  mucous membranes moist.   Cardiovascular: S1 S2 auscultated, tachycardic, no murmurs.  Respiratory: Clear to auscultation   Abdomen: Soft, obese, Mildly TTP, nondistended, + bowel sounds, no rebound or guarding  Extremities: warm dry without cyanosis clubbing or edema  Neuro: AAOx3, nonfocal  Psych: appropriate mood and affect  Data Review   Micro Results Recent Results (from the past 240 hour(s))  Urine culture     Status: None   Collection Time: 06/03/15 11:36 PM  Result Value Ref Range Status   Specimen Description URINE, CATHETERIZED  Final   Special Requests NONE  Final   Culture NO GROWTH 1 DAY   Final   Report Status 06/05/2015 FINAL  Final  C difficile quick scan w PCR reflex     Status: None   Collection Time: 06/06/15  8:59 AM  Result Value Ref Range Status   C Diff antigen NEGATIVE NEGATIVE Final   C Diff toxin NEGATIVE NEGATIVE Final   C Diff interpretation Negative for toxigenic C. difficile  Final    Radiology Reports Ct Abdomen Pelvis W Contrast  06/04/2015   CLINICAL DATA:  Nausea, vomiting, diarrhea, and fever for 3 weeks. Diffuse abdominal pain more in the right lower quadrant.  EXAM: CT ABDOMEN AND PELVIS WITH CONTRAST  TECHNIQUE: Multidetector CT imaging of the abdomen and pelvis was performed using the standard protocol following bolus administration of intravenous contrast.  CONTRAST:  154m OMNIPAQUE IOHEXOL 300 MG/ML  SOLN  COMPARISON:  05/25/2015 from [facility]  bases are clear.  Diffuse fatty infiltration of the liver. Gallbladder, spleen, pancreas, adrenal glands, kidneys, abdominal aorta, inferior vena cava, and retroperitoneal lymph nodes are unremarkable. Stomach and small bowel are decompressed. Contrast material is present in the colon suggesting no evidence of small bowel obstruction. There is diffuse colonic wall thickening and diffuse infiltration in the pericolonic fat extending from the splenic flexure along the descending colon and to the sigmoid colon. This is consistent with colitis and could represent inflammatory or infectious etiologies. The amount of pericolonic infiltration is increasing since the previous study, vertically around the sigmoid region. No free air or free fluid in the abdomen. Abdominal wall musculature appears intact.  Pelvis: Bladder wall is not thickened. Uterus and ovaries are not enlarged. Appendix is normal. No pelvic mass or lymphadenopathy. No destructive bone lesions.  IMPRESSION: Wall thickening and prominent pericolonic infiltration around the descending and sigmoid colon consistent with regional colitis.  Inflammatory reaction is increasing since previous study. No discrete abscess identified.   Electronically Signed   By: Lucienne Capers M.D.   On: 06/04/2015 04:35   Ct Abdomen Pelvis W Contrast  05/25/2015   CLINICAL DATA:  Abdominal pain radiating to the left side for several months. Nausea and vomiting beginning in July.  EXAM: CT ABDOMEN AND PELVIS WITH CONTRAST  TECHNIQUE: Multidetector CT imaging of the abdomen and pelvis was performed using the standard protocol following bolus administration of intravenous contrast.  CONTRAST:  100 mL OMNIPAQUE IOHEXOL 300 MG/ML  SOLN  COMPARISON:  CT abdomen and pelvis 05/20/2015 and 11/17/2014.  FINDINGS: The lung bases are clear. No pleural or pericardial effusion. Heart size is mildly enlarged.  As seen on the prior examination, there is wall thickening with surrounding stranding of the descending colon to the sigmoid. The appearance is not markedly changed. Prominent stool burden in the ascending and transverse colon is noted. The appendix is unremarkable. The stomach and small bowel appear normal.  There is fatty infiltration of the liver. The spleen, adrenal glands, pancreas and kidneys appear normal. No lymphadenopathy or fluid is identified. Uterus, adnexa and urinary bladder are unremarkable.  Image bones are unremarkable.  IMPRESSION: Findings consistent with colitis of the descending colon, not markedly changed compare to the patient's recent CT abdomen and pelvis. No new abnormality.  Fatty infiltration of the liver.  Cardiomegaly.   Electronically Signed   By: Inge Rise M.D.   On: 05/25/2015 16:13   Ct Abdomen Pelvis W Contrast  05/20/2015   CLINICAL DATA:  Left lower quadrant abdominal pain. History of diverticulitis.  EXAM: CT ABDOMEN AND PELVIS WITH CONTRAST  TECHNIQUE: Multidetector CT imaging of the abdomen and pelvis was performed using the standard protocol following bolus administration of intravenous contrast.  CONTRAST:  180m OMNIPAQUE  IOHEXOL 300 MG/ML SOLN, 555mOMNIPAQUE IOHEXOL 300 MG/ML SOLN  COMPARISON:  None.  FINDINGS: Lung bases are clear. No effusions. Heart is normal size.  Mild diffuse fatty infiltration of the liver. No focal abnormality. Gallbladder, spleen, pancreas, adrenals and kidneys are unremarkable.  There is marked wall thickening and surrounding inflammatory changes around the entire descending colon and proximal sigmoid colon. Findings most compatible with colitis. No fluid collection. No free air. Small bowel and stomach are unremarkable. Moderate stool burden throughout the colon.  Uterus, adnexae and urinary bladder are unremarkable. No free fluid, free air or adenopathy. There are mildly prominent pericolonic lymph nodes adjacent to the rectum and sigmoid colon, presumably reactive. Aorta is normal caliber.  No  acute bony abnormality or focal bone lesion.  IMPRESSION: Marked colonic wall thickening and surrounding inflammatory change involving the entire descending colon and proximal sigmoid colon compatible with colitis.  Moderate stool burden throughout the colon.  Fatty liver.   Electronically Signed   By: Rolm Baptise M.D.   On: 05/20/2015 19:07    CBC  Recent Labs Lab 06/03/15 1840 06/05/15 1455 06/06/15 1121 06/07/15 0448 06/08/15 0535  WBC 6.6 7.6 6.8 13.0* 12.5*  HGB 10.2* 9.6* 8.7* 8.8* 8.5*  HCT 33.8* 31.0* 29.6* 29.0* 28.2*  PLT 498* 388 386 379 394  MCV 61.3* 60.7* 60.9* 60.8* 61.2*  MCH 18.5* 18.8* 17.9* 18.4* 18.4*  MCHC 30.2 31.0 29.4* 30.3 30.1  RDW 21.5* 21.5* 21.3* 21.6* 22.0*    Chemistries   Recent Labs Lab 06/03/15 1840 06/05/15 1455 06/06/15 1121 06/07/15 0448 06/08/15 0535  NA 137 135 133* 135 136  K 3.7 3.4* 3.2* 3.6 4.1  CL 102 102 100* 104 105  CO2 24 26 27 23 26   GLUCOSE 84 98 103* 149* 116*  BUN <5* 7 6 <5* <5*  CREATININE 0.72 0.65 0.57 0.50 0.50  CALCIUM 8.4* 8.0* 7.7* 7.8* 8.1*  AST 22  --   --   --  20  ALT 15  --   --   --  13*  ALKPHOS 79  --    --   --  79  BILITOT 0.9  --   --   --  0.6   ------------------------------------------------------------------------------------------------------------------ estimated creatinine clearance is 150.1 mL/min (by C-G formula based on Cr of 0.5). ------------------------------------------------------------------------------------------------------------------ No results for input(s): HGBA1C in the last 72 hours. ------------------------------------------------------------------------------------------------------------------ No results for input(s): CHOL, HDL, LDLCALC, TRIG, CHOLHDL, LDLDIRECT in the last 72 hours. ------------------------------------------------------------------------------------------------------------------ No results for input(s): TSH, T4TOTAL, T3FREE, THYROIDAB in the last 72 hours.  Invalid input(s): FREET3 ------------------------------------------------------------------------------------------------------------------  Recent Labs  06/07/15 0940  VITAMINB12 1837*  FOLATE 5.4*  FERRITIN 8*  TIBC 216*  IRON 11*  RETICCTPCT 1.4    Coagulation profile No results for input(s): INR, PROTIME in the last 168 hours.  No results for input(s): DDIMER in the last 72 hours.  Cardiac Enzymes No results for input(s): CKMB, TROPONINI, MYOGLOBIN in the last 168 hours.  Invalid input(s): CK ------------------------------------------------------------------------------------------------------------------ Invalid input(s): POCBNP    Verneita Griffes, MD [facility] [phone]

## 2015-06-10 DIAGNOSIS — E43 Unspecified severe protein-calorie malnutrition: Secondary | ICD-10-CM | POA: Insufficient documentation

## 2015-06-10 DIAGNOSIS — K659 Peritonitis, unspecified: Secondary | ICD-10-CM

## 2015-06-10 LAB — BASIC METABOLIC PANEL
Anion gap: 6 (ref 5–15)
BUN: 10 mg/dL (ref 6–20)
CO2: 26 mmol/L (ref 22–32)
CREATININE: 0.58 mg/dL (ref 0.44–1.00)
Calcium: 7.6 mg/dL — ABNORMAL LOW (ref 8.9–10.3)
Chloride: 100 mmol/L — ABNORMAL LOW (ref 101–111)
GFR calc Af Amer: >60 mL/min (ref >60)
Glucose, Bld: 102 mg/dL — ABNORMAL HIGH (ref 65–99)
Potassium: 4.2 mmol/L (ref 3.5–5.1)
SODIUM: 132 mmol/L — AB (ref 135–145)

## 2015-06-10 LAB — GLUCOSE, CAPILLARY
GLUCOSE-CAPILLARY: 69 mg/dL (ref 65–99)
GLUCOSE-CAPILLARY: 125 mg/dL — AB (ref 65–99)
Glucose-Capillary: 93 mg/dL (ref 65–99)

## 2015-06-10 LAB — CBC
HCT: 29 % — ABNORMAL LOW (ref 36.0–46.0)
Hemoglobin: 8.8 g/dL — ABNORMAL LOW (ref 12.0–15.0)
MCH: 18.5 pg — AB (ref 26.0–34.0)
MCHC: 30.3 g/dL (ref 30.0–36.0)
MCV: 61.1 fL — ABNORMAL LOW (ref 78.0–100.0)
PLATELETS: 348 10*3/uL (ref 150–400)
RBC: 4.75 MIL/uL (ref 3.87–5.11)
RDW: 22.4 % — AB (ref 11.5–15.5)
WBC: 18.9 10*3/uL — ABNORMAL HIGH (ref 4.0–10.5)

## 2015-06-10 LAB — POCT I-STAT 3, ART BLOOD GAS (G3+)
Acid-Base Excess: 5 mmol/L — ABNORMAL HIGH (ref 0.0–2.0)
Bicarbonate: 30.1 mEq/L — ABNORMAL HIGH (ref 20.0–24.0)
O2 SAT: 100 %
PCO2 ART: 47 mmHg — AB (ref 35.0–45.0)
PH ART: 7.415 (ref 7.350–7.450)
PO2 ART: 171 mmHg — AB (ref 80.0–100.0)
Patient temperature: 98.5
TCO2: 32 mmol/L (ref 0–100)

## 2015-06-10 MED ORDER — HEPARIN SODIUM (PORCINE) 5000 UNIT/ML IJ SOLN
5000.0000 [IU] | Freq: Three times a day (TID) | INTRAMUSCULAR | Status: AC
  Administered 2015-06-10 – 2015-06-11 (×3): 5000 [IU] via SUBCUTANEOUS
  Filled 2015-06-10 (×3): qty 1

## 2015-06-10 MED ORDER — LACTATED RINGERS IV SOLN
INTRAVENOUS | Status: DC
  Administered 2015-06-10 – 2015-06-11 (×4): via INTRAVENOUS

## 2015-06-10 MED ORDER — CHLORHEXIDINE GLUCONATE 0.12 % MT SOLN
OROMUCOSAL | Status: AC
  Administered 2015-06-10: 15 mL via OROMUCOSAL
  Filled 2015-06-10: qty 15

## 2015-06-10 MED ORDER — INSULIN ASPART 100 UNIT/ML ~~LOC~~ SOLN: 2.0000 [IU] | SUBCUTANEOUS | Status: DC

## 2015-06-10 MED ORDER — ALBUTEROL SULFATE (2.5 MG/3ML) 0.083% IN NEBU
2.5000 mg | INHALATION_SOLUTION | RESPIRATORY_TRACT | Status: DC | PRN
  Administered 2015-06-18: 2.5 mg via RESPIRATORY_TRACT
  Filled 2015-06-10: qty 3

## 2015-06-10 MED ORDER — DEXTROSE 50 % IV SOLN
12.5000 g | INTRAVENOUS | Status: DC | PRN
  Administered 2015-06-10: 12.5 g via INTRAVENOUS
  Filled 2015-06-10: qty 50

## 2015-06-10 MED ORDER — CIPROFLOXACIN IN D5W 400 MG/200ML IV SOLN
400.0000 mg | Freq: Two times a day (BID) | INTRAVENOUS | Status: DC
  Administered 2015-06-10 – 2015-06-14 (×8): 400 mg via INTRAVENOUS
  Filled 2015-06-10 (×10): qty 200

## 2015-06-10 NOTE — Progress Notes (Signed)
PULMONARY  / CRITICAL CARE MEDICINE CONSULTATION   Name: Dominique [name] MRN: [id number] DOB: [birth year]    ADMISSION DATE:  06/03/2015 CONSULTATION DATE: 06/09/15  REQUESTING CLINICIAN: Nita Sells, MD PRIMARY SERVICE: Surgery  CHIEF COMPLAINT:  N/a  BRIEF PATIENT DESCRIPTION: 34 y/o woman with perforated colon s/p partial colectomy on mechanical ventilation.  SIGNIFICANT EVENTS / STUDIES:  CSY with biopsies 9/12 S/p ex lap 06/09/15  LINES / TUBES: ETT 06/09/15 Art Line, 06/09/15 R IJ CVC 06/09/15 NGT 06/09/15 Foley 06/09/15  CULTURES: Wound cx 06/09/15 -- NGTD  ANTIBIOTICS: Cipro 06/09/15>> Flagyl 06/09/15>>  HISTORY OF PRESENT ILLNESS:   Dominique [name] is a 34 year old morbidly obsese woman who recently had a colonoscopy to work up colitis, which apparently showed Crohn's disease. She developed worsening abdomdinal pain after the procedure and presented to the ED and was found to have free air. She was taken to the OR for an ex-lap with partial colectomy and had a wound vac placed with an open abdomen. She was kept ventilated and PCCM consulted for vent management.  REVIEW OF SYSTEMS:  N/A  SUBJECTIVE / Interval events:  Stable post-op Sedated  VITAL SIGNS: Temp:  [97.4 F (36.3 C)-98.2 F (36.8 C)] 97.4 F (36.3 C) (09/17 0725) Pulse Rate:  [76-114] 77 (09/17 0800) Resp:  [15-16] 15 (09/17 0800) BP: (104-141)/(59-80) 115/63 mmHg (09/17 0800) SpO2:  [98 %-100 %] 100 % (09/17 0800) Arterial Line BP: (89-148)/(62-81) 133/62 mmHg (09/17 0800) FiO2 (%):  [40 %-50 %] 40 % (09/17 0735) HEMODYNAMICS:   VENTILATOR SETTINGS: Vent Mode:  [-] PRVC FiO2 (%):  [40 %-50 %] 40 % Set Rate:  [15 bmp] 15 bmp Vt Set:  [420 mL] 420 mL PEEP:  [10 cmH20] 10 cmH20 Plateau Pressure:  [21 [id number] cmH20] 22 cmH20 INTAKE / OUTPUT: Intake/Output      09/16 0701 - 09/17 0700 09/17 0701 - 09/18 0700   P.O. 120    I.V. (mL/kg) 3523.1 (21.1) 90.2 (0.5)   NG/GT 60    IV Piggyback 100    Total Intake(mL/kg) 3803.1 (22.8) 90.2 (0.5)   Urine (mL/kg/hr) 550 (0.1) 60 (0.2)   Drains 500 (0.1)    Blood 400 (0.1)    Total Output 1450 60   Net +2353.1 +30.2        Urine Occurrence 2 x      PHYSICAL EXAMINATION: General:  Morbidly obese woman intubated/sedated Neuro:  Sedated on propfol HEENT:  No LAD Neck: No JVD Cardiovascular:  Heart sounds dual and normal Lungs:  Difficult to auscultate, clear Abdomen:  Super obese. Wound vanc in place. Musculoskeletal:  No joint swelling Skin:  No rashes.  LABS:  CBC  Recent Labs Lab 06/08/15 0535 06/09/15 1325 06/10/15 0432  WBC 12.5* 21.2* 18.9*  HGB 8.5* 10.4* 8.8*  HCT 28.2* 34.3* 29.0*  PLT 394 445* 348   Coag's No results for input(s): APTT, INR in the last 168 hours. BMET  Recent Labs Lab 06/08/15 0535 06/09/15 1325 06/10/15 0432  NA 136 133* 132*  K 4.1 4.1 4.2  CL 105 101 100*  CO2 26 21* 26  BUN <5* 9 10  CREATININE 0.50 1.09* 0.58  GLUCOSE 116* 112* 102*   Electrolytes  Recent Labs Lab 06/08/15 0535 06/09/15 1325 06/10/15 0432  CALCIUM 8.1* 8.3* 7.6*   Sepsis Markers No results for input(s): LATICACIDVEN, PROCALCITON, O2SATVEN in the last 168 hours. ABG No results for input(s): PHART, PCO2ART, PO2ART in the last 168 hours. Liver Enzymes  Recent Labs Lab 06/03/15 1840 06/08/15 0535 06/09/15 1325  AST 22 20 44*  ALT 15 13* 17  ALKPHOS 79 79 95  BILITOT 0.9 0.6 0.6  ALBUMIN 2.5* 2.2* 2.1*   Cardiac Enzymes No results for input(s): TROPONINI, PROBNP in the last 168 hours. Glucose  Recent Labs Lab 06/08/15 0634 06/08/15 1119 06/08/15 1621 06/08/15 2032 06/09/15 1148 06/09/15 1606  GLUCAP 123* 114* 122* 110* 97 103*    Imaging Ct Angio Chest Pe W/cm &/or Wo Cm  06/09/2015   CLINICAL DATA:  Shortness of breath, abdominal pain, Crohn's disease  EXAM: CT ANGIOGRAPHY CHEST WITH CONTRAST  TECHNIQUE: Multidetector CT imaging of the chest was performed using the standard  protocol during bolus administration of intravenous contrast. Multiplanar CT image reconstructions and MIPs were obtained to evaluate the vascular anatomy.  CONTRAST:  11m OMNIPAQUE IOHEXOL 350 MG/ML SOLN  COMPARISON:  None.  FINDINGS: The study is markedly limited by extensive streak artifacts from patient's large body habitus. No gross central pulmonary embolus is noted. There is fatty infiltration of the liver. No aortic aneurysm. Evaluation of lobar and segmental arterial branches is nondiagnostic for pulmonary embolus.  The visualized upper abdomen shows free air anterior abdomen please see axial image 59 highly suspicious for perforated viscus. Clinical correlation is necessary. Further correlation with CT scan of the abdomen and pelvis is recommended.  There is nodular enlargement of right lobe of thyroid gland measures 4.2 cm. Further correlation with thyroid gland ultrasound is recommended to exclude a mass. There is no mediastinal hematoma or adenopathy. No hilar adenopathy.  Images of the lung parenchyma shows no acute infiltrate or pulmonary edema.  Review of the MIP images confirms the above findings.  IMPRESSION: 1. Significant limited study by patient's large body habitus. No central pulmonary embolus is noted. 2. No acute infiltrate or pulmonary edema. No mediastinal hematoma or adenopathy. 3. There is free abdominal air in upper abdomen highly suspicious for perforated viscus. Clinical correlation is necessary. 4. There is 4.2 cm nodule lesion in right lobe of thyroid gland. Further evaluation with thyroid gland ultrasound is recommended. Critical Value/emergent results were called by telephone at the time of interpretation on 06/09/2015 at 6:02 pm to Dr. JNita Sells, who verbally acknowledged these results.   Electronically Signed   By: LLahoma CrockerM.D.   On: 06/09/2015 18:02   Dg Chest Port 1 View  06/09/2015   CLINICAL DATA:  Central line placement  EXAM: PORTABLE CHEST - 1 VIEW   COMPARISON:  08/22/2014  FINDINGS: Cardiomediastinal silhouette is stable. Endotracheal tube in place with tip 4.8 cm above the carina. NG tube in place. There is right IJ central line with tip in SVC right atrium junction. No pulmonary edema. No segmental infiltrate. No pneumothorax.  IMPRESSION: Endotracheal and NG tube in place. Right IJ central line with tip in SVC right atrium junction. No pneumothorax.   Electronically Signed   By: LLahoma CrockerM.D.   On: 06/09/2015 23:04    EKG: NSR CXR: Clear  ASSESSMENT / PLAN:  PULMONARY ETT 9/16 >>  Acute respiratory failure with hypoxemia Hx asthma (Dr AElsworth Soho  Severe OSA / OHS - Maintain on PRVC, will need high PEEPs likely to overcome pleural pressure for her habitus. However, as she does not seem to have any lung injury, 10 is a reasonable initial value, although this is assuredly below her resting pleural pressure when semi-supine. - will need NIPPV once extubated - hold off inhaled steroids for  now in absence wheezing - prn SABA  CARDIOVASCULAR CVL R IJ CVC 9/16 >>  R radial Art line 9/16 >>  A: hemodynamically stable  P:  - On home torsemide, holding currently   RENAL A:  Hyponatremia P:   - follow BMP  GASTROINTESTINAL A:  Perforation post-colonic biopsies Peritonitis  S/p exp lap, drainage intra-abd abscess, partial colon resection 9/16 Open abdomen P:   - wound vac in place - plan back to OR 9/18  HEMATOLOGIC A:  Anemia of chronic disease P:  - follow CBC  INFECTIOUS A:  Peritonitis / peritoneal abscess P:   Urine 9/10 >> negative C diff 9/13 >> negative Peritoneal fluid 9/16 >>   Ceftriaxone x1 >> 9/10 Flagyl 9/11 >> 9/13 Flagyl 9/16 >>  Cipro 9/16 >>   - Per notes has been on cipro + flagyl as an outpt as well  - Continue current abx, low threshold to add vanco or antifungals if clinical change  ENDOCRINE A:  At risk hyperglycemia P:   - start ICU SSI protocol - change IVF from d5 0.45 NS to LR at low  dose to replace wound vac losses.   NEUROLOGIC A:  Sedated for MV P:   RASS goal: -2 to -3 - Continue deep sedation while has open abdomen. On propofol + fentanyl   TODAY'S SUMMARY: 34 y/o woman with super morbid obesity s/p ex-lap for perf following colonic biopsy and new diagnosis of Crohn's disease. Maintained on mechanical ventilation.  I have personally obtained a history, examined the patient, evaluated laboratory and imaging results, formulated the assessment and plan and placed orders.  CRITICAL CARE: The patient is critically ill with multiple organ systems failure and requires high complexity decision making for assessment and support, frequent evaluation and titration of therapies, application of advanced monitoring technologies and extensive interpretation of multiple databases. Critical Care Time devoted to patient care services described in this note is 45 minutes.   Baltazar Apo, MD, PhD 06/10/2015, 8:44 AM [facility] [phone] or if no answer [phone]

## 2015-06-10 NOTE — Progress Notes (Signed)
Events last night noted. Consider at least stress level corticosterosteroids when infection/healing stage allows. We'll continue to follow with you

## 2015-06-10 NOTE — Progress Notes (Signed)
[facility] Progress Note Patient Name: Dominique [name] DOB: 01/24/1981 MRN: [id number]   Date of Service  06/10/2015  HPI/Events of Note  Notified pt only received one dose of Cipro.  eICU Interventions  Start Cipro per pharmacy dosing. Continue Flagyl.     Intervention Category Major Interventions: Sepsis - evaluation and management  Tera Partridge 06/10/2015, 5:21 PM

## 2015-06-10 NOTE — Consult Note (Signed)
PULMONARY  / CRITICAL CARE MEDICINE CONSULTATION   Name: Asli Tokarski MRN: [id number] DOB: 05/21/1981    ADMISSION DATE:  06/03/2015 CONSULTATION DATE: 06/09/15  REQUESTING CLINICIAN: Nita Sells, MD PRIMARY SERVICE: Surgery  CHIEF COMPLAINT:  N/a  BRIEF PATIENT DESCRIPTION: 34 y/o woman with perforated colon s/p partial colectomy on mechanical ventilation.  SIGNIFICANT EVENTS / STUDIES:  S/p ex lap 06/09/15  LINES / TUBES: ETT 06/09/15 Art Line, 06/09/15 R IJ CVC 06/09/15 NGT 06/09/15 Foley 06/09/15  CULTURES: Wound cx 06/09/15 -- NGTD  ANTIBIOTICS: Cipro 06/09/15>> Flagyl 06/09/15>>  HISTORY OF PRESENT ILLNESS:   Ms. Roldan is a 34 year old morbidly obsese woman who recently had a colonoscopy to work up colitis, which apparently showed Crohn's disease. She developed worsening abdomdinal pain after the procedure and presented to the ED and was found to have free air. She was taken to the OR for an ex-lap with partial colectomy and had a wound vac placed with an open abdomen. She was kept ventilated and PCCM consulted for vent management.  PAST MEDICAL HISTORY :  Past Medical History  Diagnosis Date  . Asthma   . Sleep apnea   . Anemia   . Morbid obesity 03/22/2009    Qualifier: Diagnosis of  By: Ronnald Ramp CMA, Chemira    . OSA (obstructive sleep apnea) 05/02/2014  . Thyromegaly 05/02/2014  . Seasonal allergies 06/13/2014  . Acute acalculous cholecystitis 11/16/2014  . Diverticulitis of colon 11/17/2014  . Hepatic steatosis 11/17/2014   Past Surgical History  Procedure Laterality Date  . C section 2011  2011  . Flexible sigmoidoscopy N/A 06/05/2015    Procedure: FLEXIBLE SIGMOIDOSCOPY;  Surgeon: Ronald Lobo, MD;  Location: [facility];  Service: Endoscopy;  Laterality: N/A;   Prior to Admission medications   Medication Sig Start Date End Date Taking? Authorizing Provider  ferrous sulfate 325 (65 FE) MG tablet Take 2 tablets (650 mg total) by mouth daily with breakfast.  04/07/15  Yes Midge Minium, MD  HYDROcodone-acetaminophen (NORCO/VICODIN) 5-325 MG per tablet TAKE 1 TABLET BY MOUTH EVERY 6 HOURS AS NEEDED FOR PAIN 05/15/15  Yes Historical Provider, MD  metroNIDAZOLE (FLAGYL) 500 MG tablet Take 1 tablet (500 mg total) by mouth 3 (three) times daily. One po tid x 7 days 05/20/15  Yes Mercedes Camprubi-Soms, PA-C  ondansetron (ZOFRAN) 4 MG tablet Take 1 tablet (4 mg total) by mouth every 6 (six) hours. 05/25/15  Yes Heather Laisure, PA-C  albuterol (PROVENTIL) (2.5 MG/3ML) 0.083% nebulizer solution Take 3 mLs (2.5 mg total) by nebulization every 6 (six) hours as needed for wheezing or shortness of breath. Patient not taking: Reported on 06/03/2015 10/27/14   Rigoberto Noel, MD  pantoprazole (PROTONIX) 40 MG tablet Take 1 tablet (40 mg total) by mouth daily. Patient not taking: Reported on 05/20/2015 03/24/15   Kristen N Ward, DO  PROAIR HFA 108 (90 BASE) MCG/ACT inhaler INHALE 2 PUFFS BY MOUTH INTO THE LUNGS EVERY 6 HOURS AS NEEDED FOR WHEEZING OR SHORTNESS OF BREATH 02/17/15   Midge Minium, MD  ranitidine (ZANTAC) 150 MG capsule Take 1 capsule (150 mg total) by mouth 2 (two) times daily. Patient not taking: Reported on 05/20/2015 04/25/15   Mackie Pai, PA-C  torsemide (DEMADEX) 20 MG tablet Take 1 tablet (20 mg total) by mouth daily. Patient not taking: Reported on 06/03/2015 12/01/14   Midge Minium, MD   Allergies  Allergen Reactions  . Other Shortness Of Breath and Swelling    Tree  nuts  . Penicillins     Unknown childhood allergy    FAMILY HISTORY:  Family History  Problem Relation Age of Onset  . Hypertension Mother   . Hypertension Father   . Hypertension Sister   . Asthma Brother   . Diabetes Maternal Grandmother   . Hypertension Maternal Grandmother   . Hypertension Maternal Grandfather   . Cancer Paternal Grandmother     lung  . Emphysema Paternal Grandfather   . Allergies Mother    SOCIAL HISTORY:  reports that she has quit  smoking. Her smoking use included Cigarettes. She started smoking about 10 months ago. She has a 2 pack-year smoking history. She has never used smokeless tobacco. She reports that she does not drink alcohol or use illicit drugs.  REVIEW OF SYSTEMS:  N/A  SUBJECTIVE:   VITAL SIGNS: Temp:  [97.5 F (36.4 C)-98.2 F (36.8 C)] 97.5 F (36.4 C) (09/16 2359) Pulse Rate:  [76-114] 76 (09/17 0328) Resp:  [15-16] 15 (09/17 0328) BP: (104-141)/(63-80) 141/80 mmHg (09/17 0328) SpO2:  [96 %-100 %] 100 % (09/17 0328) Arterial Line BP: (142-145)/(63-81) 144/78 mmHg (09/17 0300) FiO2 (%):  [50 %] 50 % (09/17 0328) HEMODYNAMICS:   VENTILATOR SETTINGS: Vent Mode:  [-] PRVC FiO2 (%):  [50 %] 50 % Set Rate:  [15 bmp] 15 bmp Vt Set:  [420 mL] 420 mL PEEP:  [10 cmH20] 10 cmH20 Plateau Pressure:  [21 [id number] cmH20] 24 cmH20 INTAKE / OUTPUT: Intake/Output      09/16 0701 - 09/17 0700   P.O. 120   I.V. (mL/kg) 3162.8 (18.9)   IV Piggyback 100   Total Intake(mL/kg) 3382.8 (20.3)   Urine (mL/kg/hr) 430 (0.1)   Drains 400 (0.1)   Blood 400 (0.1)   Total Output 1230   Net +2152.8       Urine Occurrence 2 x     PHYSICAL EXAMINATION: General:  Morbidly obese woman intubated/sedated Neuro:  Sedated on propfol HEENT:  No LAD Neck: No JVD Cardiovascular:  Heart sounds dual and normal Lungs:  Difficult to auscultate, clear Abdomen:  Super obese. Wound vanc in place. Musculoskeletal:  No joint swelling Skin:  No rashes.  LABS:  CBC  Recent Labs Lab 06/07/15 0448 06/08/15 0535 06/09/15 1325  WBC 13.0* 12.5* 21.2*  HGB 8.8* 8.5* 10.4*  HCT 29.0* 28.2* 34.3*  PLT 379 394 445*   Coag's No results for input(s): APTT, INR in the last 168 hours. BMET  Recent Labs Lab 06/07/15 0448 06/08/15 0535 06/09/15 1325  NA 135 136 133*  K 3.6 4.1 4.1  CL 104 105 101  CO2 23 26 21*  BUN <5* <5* 9  CREATININE 0.50 0.50 1.09*  GLUCOSE 149* 116* 112*   Electrolytes  Recent Labs Lab  06/07/15 0448 06/08/15 0535 06/09/15 1325  CALCIUM 7.8* 8.1* 8.3*   Sepsis Markers No results for input(s): LATICACIDVEN, PROCALCITON, O2SATVEN in the last 168 hours. ABG No results for input(s): PHART, PCO2ART, PO2ART in the last 168 hours. Liver Enzymes  Recent Labs Lab 06/03/15 1840 06/08/15 0535 06/09/15 1325  AST 22 20 44*  ALT 15 13* 17  ALKPHOS 79 79 95  BILITOT 0.9 0.6 0.6  ALBUMIN 2.5* 2.2* 2.1*   Cardiac Enzymes No results for input(s): TROPONINI, PROBNP in the last 168 hours. Glucose  Recent Labs Lab 06/08/15 0634 06/08/15 1119 06/08/15 1621 06/08/15 2032 06/09/15 1148 06/09/15 1606  GLUCAP 123* 114* 122* 110* 97 103*    Imaging Ct Angio Chest  Pe W/cm &/or Wo Cm  06/09/2015   CLINICAL DATA:  Shortness of breath, abdominal pain, Crohn's disease  EXAM: CT ANGIOGRAPHY CHEST WITH CONTRAST  TECHNIQUE: Multidetector CT imaging of the chest was performed using the standard protocol during bolus administration of intravenous contrast. Multiplanar CT image reconstructions and MIPs were obtained to evaluate the vascular anatomy.  CONTRAST:  184m OMNIPAQUE IOHEXOL 350 MG/ML SOLN  COMPARISON:  None.  FINDINGS: The study is markedly limited by extensive streak artifacts from patient's large body habitus. No gross central pulmonary embolus is noted. There is fatty infiltration of the liver. No aortic aneurysm. Evaluation of lobar and segmental arterial branches is nondiagnostic for pulmonary embolus.  The visualized upper abdomen shows free air anterior abdomen please see axial image 59 highly suspicious for perforated viscus. Clinical correlation is necessary. Further correlation with CT scan of the abdomen and pelvis is recommended.  There is nodular enlargement of right lobe of thyroid gland measures 4.2 cm. Further correlation with thyroid gland ultrasound is recommended to exclude a mass. There is no mediastinal hematoma or adenopathy. No hilar adenopathy.  Images of the  lung parenchyma shows no acute infiltrate or pulmonary edema.  Review of the MIP images confirms the above findings.  IMPRESSION: 1. Significant limited study by patient's large body habitus. No central pulmonary embolus is noted. 2. No acute infiltrate or pulmonary edema. No mediastinal hematoma or adenopathy. 3. There is free abdominal air in upper abdomen highly suspicious for perforated viscus. Clinical correlation is necessary. 4. There is 4.2 cm nodule lesion in right lobe of thyroid gland. Further evaluation with thyroid gland ultrasound is recommended. Critical Value/emergent results were called by telephone at the time of interpretation on 06/09/2015 at 6:02 pm to Dr. JNita Sells, who verbally acknowledged these results.   Electronically Signed   By: LLahoma CrockerM.D.   On: 06/09/2015 18:02   Dg Chest Port 1 View  06/09/2015   CLINICAL DATA:  Central line placement  EXAM: PORTABLE CHEST - 1 VIEW  COMPARISON:  08/22/2014  FINDINGS: Cardiomediastinal silhouette is stable. Endotracheal tube in place with tip 4.8 cm above the carina. NG tube in place. There is right IJ central line with tip in SVC right atrium junction. No pulmonary edema. No segmental infiltrate. No pneumothorax.  IMPRESSION: Endotracheal and NG tube in place. Right IJ central line with tip in SVC right atrium junction. No pneumothorax.   Electronically Signed   By: LLahoma CrockerM.D.   On: 06/09/2015 23:04    EKG: NSR CXR: Clear  ASSESSMENT / PLAN:  Need for Mechanical Ventilation - Maintain on PRVC, will need high PEEPs likely to overcome pleural pressure for her habitus. However, as she does not seem to have any lung injury, 10 is a reasonable initial value, although this is assuredly below her resting pleural pressure when semi-supine.   TODAY'S SUMMARY: 34y/o woman with super morbid obesity s/p ex-lap for perf following colonic biopsy and new diagnosis of Crohn's disease. Maintained on mechanical ventilation.  I have  personally obtained a history, examined the patient, evaluated laboratory and imaging results, formulated the assessment and plan and placed orders.  CRITICAL CARE: The patient is critically ill with multiple organ systems failure and requires high complexity decision making for assessment and support, frequent evaluation and titration of therapies, application of advanced monitoring technologies and extensive interpretation of multiple databases. Critical Care Time devoted to patient care services described in this note is 35 minutes.  Jodell Cipro, MD Pulmonary and [place] Pager: [phone]   06/10/2015, 4:06 AM

## 2015-06-10 NOTE — Progress Notes (Signed)
1 Day Post-Op  Subjective: Events noted. No significant events overnight  Objective: Vital signs in last 24 hours: Temp:  [97.4 F (36.3 C)-98.2 F (36.8 C)] 97.4 F (36.3 C) (09/17 0725) Pulse Rate:  [76-114] 77 (09/17 0800) Resp:  [15-16] 15 (09/17 0800) BP: (104-141)/(59-80) 115/63 mmHg (09/17 0800) SpO2:  [98 %-100 %] 100 % (09/17 0800) Arterial Line BP: (89-148)/(62-81) 133/62 mmHg (09/17 0800) FiO2 (%):  [40 %-50 %] 40 % (09/17 0735) Last BM Date: 06/08/15  Intake/Output from previous day: 09/16 0701 - 09/17 0700 In: 3803.1 [P.O.:120; I.V.:3523.1; NG/GT:60; IV Piggyback:100] Out: 1450 [Urine:550; Drains:500; Blood:400] Intake/Output this shift: Total I/O In: 90.2 [I.V.:90.2] Out: 60 [Urine:60]  Intubated, sedated cta ant Reg Obese, open abd wound vac intact  Lab Results:   Recent Labs  06/09/15 1325 06/10/15 0432  WBC 21.2* 18.9*  HGB 10.4* 8.8*  HCT 34.3* 29.0*  PLT 445* 348   BMET  Recent Labs  06/09/15 1325 06/10/15 0432  NA 133* 132*  K 4.1 4.2  CL 101 100*  CO2 21* 26  GLUCOSE 112* 102*  BUN 9 10  CREATININE 1.09* 0.58  CALCIUM 8.3* 7.6*   PT/INR No results for input(s): LABPROT, INR in the last 72 hours. ABG No results for input(s): PHART, HCO3 in the last 72 hours.  Invalid input(s): PCO2, PO2  Studies/Results: Ct Angio Chest Pe W/cm &/or Wo Cm  06/09/2015   CLINICAL DATA:  Shortness of breath, abdominal pain, Crohn's disease  EXAM: CT ANGIOGRAPHY CHEST WITH CONTRAST  TECHNIQUE: Multidetector CT imaging of the chest was performed using the standard protocol during bolus administration of intravenous contrast. Multiplanar CT image reconstructions and MIPs were obtained to evaluate the vascular anatomy.  CONTRAST:  117m OMNIPAQUE IOHEXOL 350 MG/ML SOLN  COMPARISON:  None.  FINDINGS: The study is markedly limited by extensive streak artifacts from patient's large body habitus. No gross central pulmonary embolus is noted. There is fatty  infiltration of the liver. No aortic aneurysm. Evaluation of lobar and segmental arterial branches is nondiagnostic for pulmonary embolus.  The visualized upper abdomen shows free air anterior abdomen please see axial image 59 highly suspicious for perforated viscus. Clinical correlation is necessary. Further correlation with CT scan of the abdomen and pelvis is recommended.  There is nodular enlargement of right lobe of thyroid gland measures 4.2 cm. Further correlation with thyroid gland ultrasound is recommended to exclude a mass. There is no mediastinal hematoma or adenopathy. No hilar adenopathy.  Images of the lung parenchyma shows no acute infiltrate or pulmonary edema.  Review of the MIP images confirms the above findings.  IMPRESSION: 1. Significant limited study by patient's large body habitus. No central pulmonary embolus is noted. 2. No acute infiltrate or pulmonary edema. No mediastinal hematoma or adenopathy. 3. There is free abdominal air in upper abdomen highly suspicious for perforated viscus. Clinical correlation is necessary. 4. There is 4.2 cm nodule lesion in right lobe of thyroid gland. Further evaluation with thyroid gland ultrasound is recommended. Critical Value/emergent results were called by telephone at the time of interpretation on 06/09/2015 at 6:02 pm to Dr. JNita Sells, who verbally acknowledged these results.   Electronically Signed   By: LLahoma CrockerM.D.   On: 06/09/2015 18:02   Dg Chest Port 1 View  06/09/2015   CLINICAL DATA:  Central line placement  EXAM: PORTABLE CHEST - 1 VIEW  COMPARISON:  08/22/2014  FINDINGS: Cardiomediastinal silhouette is stable. Endotracheal tube in place with tip  4.8 cm above the carina. NG tube in place. There is right IJ central line with tip in SVC right atrium junction. No pulmonary edema. No segmental infiltrate. No pneumothorax.  IMPRESSION: Endotracheal and NG tube in place. Right IJ central line with tip in SVC right atrium junction. No  pneumothorax.   Electronically Signed   By: Lahoma Crocker M.D.   On: 06/09/2015 23:04    Anti-infectives: Anti-infectives    Start     Dose/Rate Route Frequency Ordered Stop   06/09/15 1927  ciprofloxacin (CIPRO) 400 MG/200ML IVPB    Comments:  Ubaldo Glassing   : cabinet override      06/09/15 1927 06/09/15 1953   06/09/15 1830  ciprofloxacin (CIPRO) IVPB 400 mg     400 mg 200 mL/hr over 60 Minutes Intravenous  Once 06/09/15 1825     06/09/15 1830  metroNIDAZOLE (FLAGYL) IVPB 500 mg     500 mg 100 mL/hr over 60 Minutes Intravenous Every 8 hours 06/09/15 1825     06/04/15 1200  metroNIDAZOLE (FLAGYL) IVPB 500 mg  Status:  Discontinued     500 mg 100 mL/hr over 60 Minutes Intravenous Every 8 hours 06/04/15 1034 06/07/15 1405   06/04/15 0015  cefTRIAXone (ROCEPHIN) 1 g in dextrose 5 % 50 mL IVPB     1 g 100 mL/hr over 30 Minutes Intravenous  Once 06/04/15 0012 06/04/15 0108      Assessment/Plan: Perforated colon with abscess in setting of Crohns s/p Procedure(s): EXPLORATORY LAPAROTOMY, DRAINAGE OF INTRAABDOMINAL ABSCESS, PARTIAL COLON RESECTION,  APPLICATION OF WOUND VAC (N/A)  Cont IV abx Keep sedated - has open abdomen Will plan on taking pt back to OR Sunday if no events today/overnight for colostomy creation, abdominal closure Pt can have some doses of chemical VTE prophylaxis Cont foley/Ng tube No family at bedside  Leighton Ruff. Redmond Pulling, MD, FACS General, Bariatric, & Minimally Invasive Surgery [facility], [place]   LOS: 6 days    Gayland Curry 06/10/2015

## 2015-06-10 NOTE — Progress Notes (Signed)
Nutrition Follow-up  DOCUMENTATION CODES:   Severe malnutrition in context of acute illness/injury, Morbid obesity  INTERVENTION:   If patient is intubated for >/= 48 hours, recommend starting nutrition.  Recommend Vital High Protein at goal of 10 ml/hr via NGT with 60 ml Prostat 4 times daily. TF regimen with current propofol rate provides 1967 kcal, 141 grams of protein, and 202 ml of H2O.   If patient is unable to tolerate enteral nutrition or unable to use gut, recommend TPN.  RD to continue to monitor.   NUTRITION DIAGNOSIS:   Inadequate oral intake related to inability to eat as evidenced by NPO status.  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 Pt with recurrent colitis. Pt with Crohn's disease found to be free air.   PROCEDURE (9/16): EXPLORATORY LAPAROTOMY, DRAINAGE OF INTRA-ABDOMINAL ABSCESS, PARTIAL COLON RESECTION, APPLICATION OF WOUND VAC  Patient is currently intubated on ventilator support MV: 6 L/min Temp (24hrs), Avg:97.7 F (36.5 C), Min:97.4 F (36.3 C), Max:98.2 F (36.8 C)  Propofol: 35.1 ml/hr which provides 927 kcal/day  Pt with open abdomen VAC. Plans for OR again Sunday. Noted pt currently Ensure orders still in place. RD to discontinue. Labs and medications reviewed.   Diet Order:  Diet NPO time specified  Skin:  Wound (see comment) (Open abdomen wound VAC)  Last BM:  9/15  Height:   Ht Readings from Last 1 Encounters:  06/03/15 5' 2"  (1.575 m)    Weight:   Wt Readings from Last 1 Encounters:  06/03/15 368 lb 3 oz (167.009 kg)    Ideal Body Weight:  50 kg  BMI:  Body mass index is 67.33 kg/(m^2).  Estimated Nutritional Needs:   Kcal:   1100-1250  Protein:  125-140 grams  Fluid:  Per MD  EDUCATION NEEDS:   Education needs no appropriate at this time  Corrin Parker, MS, RD, LDN Pager # [phone] After hours/ weekend pager # [phone]

## 2015-06-11 ENCOUNTER — Inpatient Hospital Stay (HOSPITAL_COMMUNITY): Payer: Medicaid Other | Admitting: Anesthesiology

## 2015-06-11 ENCOUNTER — Encounter (HOSPITAL_COMMUNITY): Payer: Self-pay | Admitting: Certified Registered Nurse Anesthetist

## 2015-06-11 ENCOUNTER — Encounter (HOSPITAL_COMMUNITY)
Admission: EM | Disposition: A | Discharge status: Rehabilitation Facility | Payer: Self-pay | Source: Home / Self Care | Attending: Emergency Medicine

## 2015-06-11 HISTORY — PX: LAPAROTOMY: SHX154

## 2015-06-11 HISTORY — PX: COLOSTOMY: SHX63

## 2015-06-11 LAB — GLUCOSE, CAPILLARY
GLUCOSE-CAPILLARY: 91 mg/dL (ref 65–99)
GLUCOSE-CAPILLARY: 87 mg/dL (ref 65–99)
GLUCOSE-CAPILLARY: 91 mg/dL (ref 65–99)
GLUCOSE-CAPILLARY: 85 mg/dL (ref 65–99)
Glucose-Capillary: 90 mg/dL (ref 65–99)
Glucose-Capillary: 91 mg/dL (ref 65–99)
Glucose-Capillary: 94 mg/dL (ref 65–99)

## 2015-06-11 LAB — CBC
HCT: 28.6 % — ABNORMAL LOW (ref 36.0–46.0)
HCT: 32.5 % — ABNORMAL LOW (ref 36.0–46.0)
HEMOGLOBIN: 9.5 g/dL — AB (ref 12.0–15.0)
Hemoglobin: 8.8 g/dL — ABNORMAL LOW (ref 12.0–15.0)
MCH: 18.4 pg — AB (ref 26.0–34.0)
MCH: 18.6 pg — ABNORMAL LOW (ref 26.0–34.0)
MCHC: 29.2 g/dL — AB (ref 30.0–36.0)
MCHC: 30.8 g/dL (ref 30.0–36.0)
MCV: 60.5 fL — AB (ref 78.0–100.0)
MCV: 63 fL — ABNORMAL LOW (ref 78.0–100.0)
PLATELETS: 274 10*3/uL (ref 150–400)
Platelets: 246 10*3/uL (ref 150–400)
RBC: 4.73 MIL/uL (ref 3.87–5.11)
RBC: 5.16 MIL/uL — ABNORMAL HIGH (ref 3.87–5.11)
RDW: 21.9 % — ABNORMAL HIGH (ref 11.5–15.5)
RDW: 22.1 % — AB (ref 11.5–15.5)
WBC: 17.1 10*3/uL — ABNORMAL HIGH (ref 4.0–10.5)
WBC: 8 10*3/uL (ref 4.0–10.5)

## 2015-06-11 LAB — BASIC METABOLIC PANEL
Anion gap: 5 (ref 5–15)
BUN: 7 mg/dL (ref 6–20)
CALCIUM: 7.5 mg/dL — AB (ref 8.9–10.3)
CO2: 27 mmol/L (ref 22–32)
CREATININE: 0.55 mg/dL (ref 0.44–1.00)
Chloride: 104 mmol/L (ref 101–111)
GFR calc non Af Amer: >60 mL/min (ref >60)
Glucose, Bld: 91 mg/dL (ref 65–99)
Potassium: 3.9 mmol/L (ref 3.5–5.1)
SODIUM: 136 mmol/L (ref 135–145)

## 2015-06-11 LAB — MAGNESIUM: MAGNESIUM: 1.8 mg/dL (ref 1.7–2.4)

## 2015-06-11 SURGERY — LAPAROTOMY, EXPLORATORY
Anesthesia: General | Site: Abdomen

## 2015-06-11 MED ORDER — SODIUM CHLORIDE 0.9 % IV BOLUS (SEPSIS)
500.0000 mL | Freq: Once | INTRAVENOUS | Status: AC
  Administered 2015-06-11: 500 mL via INTRAVENOUS

## 2015-06-11 MED ORDER — ROCURONIUM BROMIDE 50 MG/5ML IV SOLN
INTRAVENOUS | Status: AC
  Filled 2015-06-11: qty 1

## 2015-06-11 MED ORDER — FENTANYL CITRATE (PF) 250 MCG/5ML IJ SOLN
INTRAMUSCULAR | Status: AC
  Filled 2015-06-11: qty 5

## 2015-06-11 MED ORDER — ROCURONIUM BROMIDE 100 MG/10ML IV SOLN
INTRAVENOUS | Status: DC | PRN
  Administered 2015-06-11: 20 mg via INTRAVENOUS
  Administered 2015-06-11: 30 mg via INTRAVENOUS
  Administered 2015-06-11: 50 mg via INTRAVENOUS

## 2015-06-11 MED ORDER — PROPOFOL 10 MG/ML IV BOLUS
INTRAVENOUS | Status: AC
  Filled 2015-06-11: qty 20

## 2015-06-11 MED ORDER — 0.9 % SODIUM CHLORIDE (POUR BTL) OPTIME
TOPICAL | Status: DC | PRN
  Administered 2015-06-11: 1000 mL
  Administered 2015-06-11: 3000 mL

## 2015-06-11 MED ORDER — MIDAZOLAM HCL 2 MG/2ML IJ SOLN
INTRAMUSCULAR | Status: AC
  Filled 2015-06-11: qty 4

## 2015-06-11 MED ORDER — MIDAZOLAM HCL 5 MG/5ML IJ SOLN
INTRAMUSCULAR | Status: DC | PRN
  Administered 2015-06-11: 2 mg via INTRAVENOUS

## 2015-06-11 MED ORDER — LIDOCAINE HCL (CARDIAC) 20 MG/ML IV SOLN
INTRAVENOUS | Status: AC
  Filled 2015-06-11: qty 5

## 2015-06-11 MED ORDER — LACTATED RINGERS IV SOLN
INTRAVENOUS | Status: DC
  Administered 2015-06-12 (×2): via INTRAVENOUS
  Administered 2015-06-13 – 2015-06-14 (×2): 100 mL/h via INTRAVENOUS
  Administered 2015-06-15 – 2015-06-17 (×2): via INTRAVENOUS

## 2015-06-11 MED ORDER — PHENYLEPHRINE HCL 10 MG/ML IJ SOLN
INTRAMUSCULAR | Status: DC | PRN
  Administered 2015-06-11: 80 ug via INTRAVENOUS

## 2015-06-11 SURGICAL SUPPLY — 48 items
BLADE SURG ROTATE 9660 (MISCELLANEOUS) IMPLANT
BNDG GAUZE ELAST 4 BULKY (GAUZE/BANDAGES/DRESSINGS) ×3 IMPLANT
CANISTER SUCTION 2500CC (MISCELLANEOUS) ×3 IMPLANT
CHLORAPREP W/TINT 26ML (MISCELLANEOUS) IMPLANT
COVER MAYO STAND STRL (DRAPES) IMPLANT
COVER SURGICAL LIGHT HANDLE (MISCELLANEOUS) ×3 IMPLANT
DRAPE LAPAROSCOPIC ABDOMINAL (DRAPES) ×3 IMPLANT
DRAPE PROXIMA HALF (DRAPES) IMPLANT
DRAPE UTILITY XL STRL (DRAPES) ×6 IMPLANT
DRAPE WARM FLUID 44X44 (DRAPE) ×3 IMPLANT
DRSG OPSITE POSTOP 4X10 (GAUZE/BANDAGES/DRESSINGS) IMPLANT
DRSG OPSITE POSTOP 4X8 (GAUZE/BANDAGES/DRESSINGS) IMPLANT
DRSG PAD ABDOMINAL 8X10 ST (GAUZE/BANDAGES/DRESSINGS) ×3 IMPLANT
ELECT BLADE 6.5 EXT (BLADE) ×3 IMPLANT
ELECT CAUTERY BLADE 6.4 (BLADE) ×6 IMPLANT
ELECT REM PT RETURN 9FT ADLT (ELECTROSURGICAL) ×3
ELECTRODE REM PT RTRN 9FT ADLT (ELECTROSURGICAL) ×1 IMPLANT
GLOVE BIO SURGEON STRL SZ8 (GLOVE) ×3 IMPLANT
GLOVE BIOGEL PI IND STRL 8 (GLOVE) ×1 IMPLANT
GLOVE BIOGEL PI INDICATOR 8 (GLOVE) ×2
GOWN STRL REUS W/ TWL LRG LVL3 (GOWN DISPOSABLE) ×2 IMPLANT
GOWN STRL REUS W/ TWL XL LVL3 (GOWN DISPOSABLE) ×1 IMPLANT
GOWN STRL REUS W/TWL LRG LVL3 (GOWN DISPOSABLE) ×4
GOWN STRL REUS W/TWL XL LVL3 (GOWN DISPOSABLE) ×2
KIT BASIN OR (CUSTOM PROCEDURE TRAY) ×3 IMPLANT
KIT OSTOMY DRAINABLE 2.75 STR (WOUND CARE) ×3 IMPLANT
KIT ROOM TURNOVER OR (KITS) ×3 IMPLANT
LIGASURE IMPACT 36 18CM CVD LR (INSTRUMENTS) ×3 IMPLANT
NS IRRIG 1000ML POUR BTL (IV SOLUTION) ×9 IMPLANT
PACK GENERAL/GYN (CUSTOM PROCEDURE TRAY) ×3 IMPLANT
PAD ARMBOARD 7.5X6 YLW CONV (MISCELLANEOUS) ×6 IMPLANT
PENCIL BUTTON HOLSTER BLD 10FT (ELECTRODE) ×3 IMPLANT
SPECIMEN JAR LARGE (MISCELLANEOUS) IMPLANT
SPONGE LAP 18X18 X RAY DECT (DISPOSABLE) IMPLANT
STAPLER VISISTAT 35W (STAPLE) ×3 IMPLANT
SUCTION POOLE TIP (SUCTIONS) ×3 IMPLANT
SUT NOVA 1 T20/GS 25DT (SUTURE) ×6 IMPLANT
SUT PDS AB 1 TP1 96 (SUTURE) ×9 IMPLANT
SUT SILK 2 0 SH CR/8 (SUTURE) ×3 IMPLANT
SUT SILK 2 0 TIES 10X30 (SUTURE) IMPLANT
SUT SILK 3 0 SH CR/8 (SUTURE) ×3 IMPLANT
SUT SILK 3 0 TIES 10X30 (SUTURE) ×3 IMPLANT
TAPE CLOTH SURG 4X10 WHT LF (GAUZE/BANDAGES/DRESSINGS) ×3 IMPLANT
TOWEL OR 17X26 10 PK STRL BLUE (TOWEL DISPOSABLE) ×3 IMPLANT
TRAY FOLEY CATH 16FRSI W/METER (SET/KITS/TRAYS/PACK) IMPLANT
TUBE CONNECTING 12'X1/4 (SUCTIONS)
TUBE CONNECTING 12X1/4 (SUCTIONS) IMPLANT
YANKAUER SUCT BULB TIP NO VENT (SUCTIONS) IMPLANT

## 2015-06-11 NOTE — Transfer of Care (Signed)
Immediate Anesthesia Transfer of Care Note  Patient: Dominique [name]  Procedure(s) Performed: Procedure(s): RE-EXPLORATION OF ABDOMEN (N/A)  CREATION OF COLOSTOMY (N/A)  Patient Location: ICU  Anesthesia Type:General  Level of Consciousness: sedated and unresponsive  Airway & Oxygen Therapy: Patient remains intubated per anesthesia plan and Patient placed on Ventilator (see vital sign flow sheet for setting)  Post-op Assessment: Report given to RN and Post -op Vital signs reviewed and stable  Post vital signs: Reviewed and stable  Last Vitals:  Filed Vitals:   06/11/15 1200  BP: 111/79  Pulse: 107  Temp:   Resp: 18    Complications: No apparent anesthesia complications

## 2015-06-11 NOTE — Progress Notes (Signed)
2 Days Post-Op  Subjective: On vent  Objective: Vital signs in last 24 hours: Temp:  [98.5 F (36.9 C)-99.5 F (37.5 C)] 98.7 F (37.1 C) (09/18 0721) Pulse Rate:  [80-120] 110 (09/18 0800) Resp:  [15-18] 18 (09/18 0800) BP: (102-146)/(47-77) 121/73 mmHg (09/18 0800) SpO2:  [98 %-100 %] 99 % (09/18 0800) Arterial Line BP: (74-161)/(60-84) 146/70 mmHg (09/18 0800) FiO2 (%):  [30 %-40 %] 30 % (09/18 0749) Last BM Date: 06/08/15  Intake/Output from previous day: 09/17 0701 - 09/18 0700 In: 2629.4 [I.V.:1909.4; NG/GT:120; IV Piggyback:600] Out: 2140 [Urine:1140; Emesis/NG output:200; Drains:800] Intake/Output this shift: Total I/O In: 168.5 [I.V.:168.5] Out: 60 [Urine:60]  Resp: clear to auscultation bilaterally Cardio: regular rate and rhythm GI: soft, open abdomen VAC in place  Lab Results:   Recent Labs  06/10/15 0432 06/11/15 0419  WBC 18.9* 8.0  HGB 8.8* 8.8*  HCT 29.0* 28.6*  PLT 348 274   BMET  Recent Labs  06/10/15 0432 06/11/15 0419  NA 132* 136  K 4.2 3.9  CL 100* 104  CO2 26 27  GLUCOSE 102* 91  BUN 10 7  CREATININE 0.58 0.55  CALCIUM 7.6* 7.5*   PT/INR No results for input(s): LABPROT, INR in the last 72 hours. ABG  Recent Labs  06/10/15 1218  PHART 7.415  HCO3 30.1*    Studies/Results: Ct Angio Chest Pe W/cm &/or Wo Cm  06/09/2015   CLINICAL DATA:  Shortness of breath, abdominal pain, Crohn's disease  EXAM: CT ANGIOGRAPHY CHEST WITH CONTRAST  TECHNIQUE: Multidetector CT imaging of the chest was performed using the standard protocol during bolus administration of intravenous contrast. Multiplanar CT image reconstructions and MIPs were obtained to evaluate the vascular anatomy.  CONTRAST:  174m OMNIPAQUE IOHEXOL 350 MG/ML SOLN  COMPARISON:  None.  FINDINGS: The study is markedly limited by extensive streak artifacts from patient's large body habitus. No gross central pulmonary embolus is noted. There is fatty infiltration of the liver. No  aortic aneurysm. Evaluation of lobar and segmental arterial branches is nondiagnostic for pulmonary embolus.  The visualized upper abdomen shows free air anterior abdomen please see axial image 59 highly suspicious for perforated viscus. Clinical correlation is necessary. Further correlation with CT scan of the abdomen and pelvis is recommended.  There is nodular enlargement of right lobe of thyroid gland measures 4.2 cm. Further correlation with thyroid gland ultrasound is recommended to exclude a mass. There is no mediastinal hematoma or adenopathy. No hilar adenopathy.  Images of the lung parenchyma shows no acute infiltrate or pulmonary edema.  Review of the MIP images confirms the above findings.  IMPRESSION: 1. Significant limited study by patient's large body habitus. No central pulmonary embolus is noted. 2. No acute infiltrate or pulmonary edema. No mediastinal hematoma or adenopathy. 3. There is free abdominal air in upper abdomen highly suspicious for perforated viscus. Clinical correlation is necessary. 4. There is 4.2 cm nodule lesion in right lobe of thyroid gland. Further evaluation with thyroid gland ultrasound is recommended. Critical Value/emergent results were called by telephone at the time of interpretation on 06/09/2015 at 6:02 pm to Dr. JNita Sells, who verbally acknowledged these results.   Electronically Signed   By: LLahoma CrockerM.D.   On: 06/09/2015 18:02   Dg Chest Port 1 View  06/09/2015   CLINICAL DATA:  Central line placement  EXAM: PORTABLE CHEST - 1 VIEW  COMPARISON:  08/22/2014  FINDINGS: Cardiomediastinal silhouette is stable. Endotracheal tube in place with tip  4.8 cm above the carina. NG tube in place. There is right IJ central line with tip in SVC right atrium junction. No pulmonary edema. No segmental infiltrate. No pneumothorax.  IMPRESSION: Endotracheal and NG tube in place. Right IJ central line with tip in SVC right atrium junction. No pneumothorax.    Electronically Signed   By: Lahoma Crocker M.D.   On: 06/09/2015 23:04    Anti-infectives: Anti-infectives    Start     Dose/Rate Route Frequency Ordered Stop   06/10/15 1800  ciprofloxacin (CIPRO) IVPB 400 mg     400 mg 200 mL/hr over 60 Minutes Intravenous Every 12 hours 06/10/15 1734     06/09/15 1927  ciprofloxacin (CIPRO) 400 MG/200ML IVPB    Comments:  Ubaldo Glassing   : cabinet override      06/09/15 1927 06/09/15 1953   06/09/15 1830  ciprofloxacin (CIPRO) IVPB 400 mg  Status:  Discontinued     400 mg 200 mL/hr over 60 Minutes Intravenous  Once 06/09/15 1825 06/10/15 1734   06/09/15 1830  metroNIDAZOLE (FLAGYL) IVPB 500 mg     500 mg 100 mL/hr over 60 Minutes Intravenous Every 8 hours 06/09/15 1825     06/04/15 1200  metroNIDAZOLE (FLAGYL) IVPB 500 mg  Status:  Discontinued     500 mg 100 mL/hr over 60 Minutes Intravenous Every 8 hours 06/04/15 1034 06/07/15 1405   06/04/15 0015  cefTRIAXone (ROCEPHIN) 1 g in dextrose 5 % 50 mL IVPB     1 g 100 mL/hr over 30 Minutes Intravenous  Once 06/04/15 0012 06/04/15 0108      Assessment/Plan: s/p Procedure(s): EXPLORATORY LAPAROTOMY, DRAINAGE OF INTRAABDOMINAL ABSCESS, PARTIAL COLON RESECTION,  APPLICATION OF WOUND VAC (N/A) POD #2 Plan return to OR later today pending OR availability. Plan ex lap and hopefully colostomy and closure of abdomen.  ID - cipro/flagyl VDRF - per CCM  LOS: 7 days    THOMPSON,BURKE E 06/11/2015

## 2015-06-11 NOTE — Progress Notes (Signed)
eLink Physician-Brief Progress Note Patient Name: Dominique [name] DOB: 30-Oct-1980 MRN: [id number]   Date of Service  06/11/2015  HPI/Events of Note  Low uop transiently better p ns 500 cc bolus with cvp still < 10 p bolus  eICU Interventions  Ns x 500cc more and monitor cvp, monitor for abd compartment syndrome as appears to be 3rd spacing      Intervention Category Major Interventions: Shock - evaluation and management  Christinia Gully 06/11/2015, 10:15 PM

## 2015-06-11 NOTE — Progress Notes (Signed)
[place] Progress Note Patient Name: Dominique [name] DOB: 02/25/1981 MRN: [id number]   Date of Service  06/11/2015  HPI/Events of Note  Post op pt with decreased uop, increased pulse, soft bp   Intake/Output Summary (Last 24 hours) at 06/11/15 2039 Last data filed at 06/11/15 2000  Gross per 24 hour  Intake 5070.53 ml  Output   2530 ml  Net 2540.53 ml     eICU Interventions  Ns x 500 c and check cvp     Intervention Category Major Interventions: Shock - evaluation and management  Christinia Gully 06/11/2015, 8:38 PM

## 2015-06-11 NOTE — Progress Notes (Signed)
[place] Progress Note Patient Name: Dominique [name] DOB: 07/22/1981 MRN: [id number]   Date of Service  06/11/2015  HPI/Events of Note  Ur OP stlll marginal after 1.5L fluid. 10-20cc/h. HR 150 sinus, MAP 60s-70s. CVP is 9. ABd sopft per rN    eICU Interventions  1. Check IAbP 2. 1L fluid boluis 3. Stat labs cbc, bmet, lactate, lft, abg, ohos, mag     Intervention Category Intermediate Interventions: Oliguria - evaluation and management  RAMASWAMY,MURALI 06/11/2015, 11:34 PM

## 2015-06-11 NOTE — Anesthesia Preprocedure Evaluation (Signed)
Anesthesia Evaluation  Patient identified by MRN, date of birth, ID band Patient awake    Reviewed: Allergy & Precautions, NPO status , Patient's Chart, lab work & pertinent test results, Unable to perform ROS - Chart review only  History of Anesthesia Complications Negative for: history of anesthetic complications  Airway Mallampati: Intubated  TM Distance: >3 FB Neck ROM: Full    Dental  (+) Teeth Intact, Dental Advisory Given   Pulmonary asthma , sleep apnea and Continuous Positive Airway Pressure Ventilation , former smoker,  Currently intubated, sedated due to open abdomen   Pulmonary exam normal breath sounds clear to auscultation       Cardiovascular (-) hypertension(-) angina(-) Past MI negative cardio ROS Normal cardiovascular exam Rhythm:Regular Rate:Normal     Neuro/Psych negative neurological ROS  negative psych ROS   GI/Hepatic Neg liver ROS, S/p Ex-lap for perforated intestine 9/16; currently with open abdomen   Endo/Other  negative endocrine ROS  Renal/GU negative Renal ROS     Musculoskeletal negative musculoskeletal ROS (+)   Abdominal (+) + obese,   Peds  Hematology  (+) Blood dyscrasia, anemia ,   Anesthesia Other Findings Day of surgery medications reviewed with the patient.  Reproductive/Obstetrics                             Anesthesia Physical  Anesthesia Plan  ASA: III  Anesthesia Plan: General   Post-op Pain Management:    Induction: Inhalational  Airway Management Planned: Oral ETT  Additional Equipment: Arterial line and CVP  Intra-op Plan:   Post-operative Plan: Post-operative intubation/ventilation  Informed Consent: I have reviewed the patients History and Physical, chart, labs and discussed the procedure including the risks, benefits and alternatives for the proposed anesthesia with the patient or authorized representative who has indicated  his/her understanding and acceptance.   Dental advisory given  Plan Discussed with: CRNA  Anesthesia Plan Comments: (Patient intubated, sedated with open abdomen.  History by chart review.  Will plan for inhalation induction.  Post-op ventilation likely.)        Anesthesia Quick Evaluation

## 2015-06-11 NOTE — Anesthesia Postprocedure Evaluation (Signed)
  Anesthesia Post-op Note  Patient: Dominique [name]  Procedure(s) Performed: Procedure(s): RE-EXPLORATION OF ABDOMEN (N/A)  CREATION OF COLOSTOMY (N/A)  Patient Location: ICU  Anesthesia Type:General  Level of Consciousness: sedated, unresponsive and Patient remains intubated per anesthesia plan  Airway and Oxygen Therapy: Patient remains intubated per anesthesia plan  Post-op Pain: sedated, unable to evaluate  Post-op Assessment: Post-op Vital signs reviewed, Patient's Cardiovascular Status Stable and Respiratory Function Stable              Post-op Vital Signs: Reviewed and stable  Last Vitals:  Filed Vitals:   06/11/15 1200  BP: 111/79  Pulse: 107  Temp:   Resp: 18    Complications: No apparent anesthesia complications

## 2015-06-11 NOTE — Op Note (Signed)
06/03/2015 - 06/11/2015  3:27 PM  PATIENT:  Dominique [name]  34 y.o. female  PRE-OPERATIVE DIAGNOSIS:  OPEN ABDOMEN S/P EXPLORATORY LAP  POST-OPERATIVE DIAGNOSIS:  OPEN ABDOMEN S/P EXPLORATORY LAP  PROCEDURE:  Procedure(s): RE-EXPLORATION OF ABDOMEN WITH CLOSURE MOBILIZATION OF THE SPLENIC FLEXURE CREATION OF COLOSTOMY  SURGEON:  Georganna Skeans, M.D.  ASSISTANTS: Alphonsa Overall, M.D.   ANESTHESIA:   general  EBL:  Total I/O In: 2694 [I.V.:2694] Out: 7948 [Urine:750; Emesis/NG output:270; Drains:125; Blood:100]  BLOOD ADMINISTERED:none  DRAINS: none   SPECIMEN:  No Specimen  DISPOSITION OF SPECIMEN:  N/A  COUNTS:  YES  DICTATION: .Dragon Dictation Findings: No significant residual intra-abdominal infection. Small bowel was run in its entirety and there was no evidence of Crohn's disease of the small bowel.  Procedure in detail: Dominique [name] is returned to the operating room for reexploration and possible closure with formation of colostomy. Informed consent was obtained from her mother. She is on IV antibiotics. She was brought directly from the intensive care unit to the operating room on the ventilator. The outer portion of her open abdomen VAC was removed. Her abdomen was prepped and draped in sterile fashion. Time out procedure was performed. The inner drape of the back was carefully removed. The abdomen was irrigated and explored. There was no significant residual infection or purulent fluid in the left upper quadrant, left lower quadrant, or elsewhere. The abdomen was irrigated and irrigation fluid returned clear. We ran the small bowel from the ligament of Treitz to the terminal ileum and there was no abnormality and no evidence of Crohn's disease. Decision was made to bring out a colostomy. The proximal descending colon was mobilized from lateral peritoneal attachments and then the splenic flexure was taken down using cautery and the LigaSure. Gastrocolic omentum was divided between  the distal transverse colon and the stomach. Some further mobilization was done using the LigaSure in this allowed a lot of the colon to be freed up in order to create a left upper quadrant colostomy. Incision was made in the left upper quadrant and subcutaneous tissues were dissected down. The fat was cored out. The fascia was identified and a cruciate incision was made. Fascia was opened to appropriate size and the colon was pulled out through to create the colostomy. The colon was tacked to the inside of the abdominal wall using silk suture 1. We then irrigated the abdomen some more and replaced the remainder of the bowel to anatomic position. Hemostasis was ensured. Bowel was returned to anatomic position. The fascia was closed with 2 lengths of #1 looped PDS from each end of the fascia and tied in the middle with additional intermittent #1 Novafil sutures. Hemostasis was assured in the subcutaneous tissues. Colostomy was matured with 3-0 Vicryl. Appliance was placed. Sterile wet-to-dry dressing was placed on the midline. WERE correct. She tolerated the procedure without apparent complication was taken directly back to the intensive care unit on the ventilator in critical condition. PATIENT DISPOSITION:  ICU - intubated and critically ill.   Delay start of Pharmacological VTE agent (>24hrs) due to surgical blood loss or risk of bleeding:  no  Georganna Skeans, MD, MPH, FACS Pager: [phone]  9/18/20163:27 PM

## 2015-06-11 NOTE — Progress Notes (Signed)
PULMONARY  / CRITICAL CARE MEDICINE CONSULTATION   Name: Dominique [name] MRN: [id number] DOB: 03-Oct-1980    ADMISSION DATE:  06/03/2015 CONSULTATION DATE: 06/09/15  REQUESTING CLINICIAN: Collene Gobble, MD PRIMARY SERVICE: Surgery  CHIEF COMPLAINT:  N/a  BRIEF PATIENT DESCRIPTION: 34 y/o woman with perforated colon s/p partial colectomy on mechanical ventilation.  SIGNIFICANT EVENTS / STUDIES:  CSY with biopsies 9/12 S/p ex lap 06/09/15  LINES / TUBES: ETT 06/09/15 Art Line, 06/09/15 R IJ CVC 06/09/15 NGT 06/09/15 Foley 06/09/15  CULTURES: Wound cx 06/09/15 -- NGTD  ANTIBIOTICS: Cipro 06/09/15>> Flagyl 06/09/15>>  HISTORY OF PRESENT ILLNESS:   Dominique [name] is a 34 year old morbidly obsese woman who recently had a colonoscopy to work up colitis, which apparently showed Crohn's disease. She developed worsening abdomdinal pain after the procedure and presented to the ED and was found to have free air. She was taken to the OR for an ex-lap with partial colectomy and had a wound vac placed with an open abdomen. She was kept ventilated and PCCM consulted for vent management.  REVIEW OF SYSTEMS:  N/A  SUBJECTIVE / Interval events:  Sedated No new issues o/n  VITAL SIGNS: Temp:  [97.4 F (36.3 C)-99.5 F (37.5 C)] 99.1 F (37.3 C) (09/18 0400) Pulse Rate:  [77-120] 112 (09/18 0700) Resp:  [15-18] 18 (09/18 0700) BP: (102-146)/(47-77) 114/74 mmHg (09/18 0700) SpO2:  [98 %-100 %] 100 % (09/18 0700) Arterial Line BP: (74-161)/(60-84) 136/62 mmHg (09/18 0700) FiO2 (%):  [30 %-40 %] 30 % (09/18 0400) HEMODYNAMICS:   VENTILATOR SETTINGS: Vent Mode:  [-] PRVC FiO2 (%):  [30 %-40 %] 30 % Set Rate:  [15 bmp-18 bmp] 18 bmp Vt Set:  [420 mL] 420 mL PEEP:  [10 cmH20] 10 cmH20 Plateau Pressure:  [22 cmH20-26 cmH20] 26 cmH20 INTAKE / OUTPUT: Intake/Output      09/17 0701 - 09/18 0700 09/18 0701 - 09/19 0700   P.O.     I.V. (mL/kg) 1909.4 (11.4)    NG/GT 120    IV Piggyback 600    Total  Intake(mL/kg) 2629.4 (15.7)    Urine (mL/kg/hr) 1140 (0.3)    Emesis/NG output 200 (0)    Drains 800 (0.2)    Blood     Total Output 2140     Net +489.4            PHYSICAL EXAMINATION: General:  Morbidly obese woman intubated/sedated Neuro:  Sedated on propfol HEENT:  No LAD Neck: No JVD Cardiovascular:  Heart sounds distant but normal Lungs:  Difficult to auscultate, clear Abdomen:  Super obese. Wound vanc in place. Musculoskeletal:  No joint swelling Skin:  No rashes.  LABS:  CBC  Recent Labs Lab 06/09/15 1325 06/10/15 0432 06/11/15 0419  WBC 21.2* 18.9* 8.0  HGB 10.4* 8.8* 8.8*  HCT 34.3* 29.0* 28.6*  PLT 445* 348 274   Coag's No results for input(s): APTT, INR in the last 168 hours. BMET  Recent Labs Lab 06/09/15 1325 06/10/15 0432 06/11/15 0419  NA 133* 132* 136  K 4.1 4.2 3.9  CL 101 100* 104  CO2 21* 26 27  BUN 9 10 7   CREATININE 1.09* 0.58 0.55  GLUCOSE 112* 102* 91   Electrolytes  Recent Labs Lab 06/09/15 1325 06/10/15 0432 06/11/15 0419  CALCIUM 8.3* 7.6* 7.5*  MG  --   --  1.8   Sepsis Markers No results for input(s): LATICACIDVEN, PROCALCITON, O2SATVEN in the last 168 hours. ABG  Recent Labs  Lab 06/10/15 1218  PHART 7.415  PCO2ART 47.0*  PO2ART 171.0*   Liver Enzymes  Recent Labs Lab 06/08/15 0535 06/09/15 1325  AST 20 44*  ALT 13* 17  ALKPHOS 79 95  BILITOT 0.6 0.6  ALBUMIN 2.2* 2.1*   Cardiac Enzymes No results for input(s): TROPONINI, PROBNP in the last 168 hours. Glucose  Recent Labs Lab 06/10/15 1117 06/10/15 1215 06/10/15 1514 06/10/15 1521 06/10/15 1926 06/10/15 2356  GLUCAP 69 125* <10* 91 93 94    Imaging Ct Angio Chest Pe W/cm &/or Wo Cm  06/09/2015   CLINICAL DATA:  Shortness of breath, abdominal pain, Crohn's disease  EXAM: CT ANGIOGRAPHY CHEST WITH CONTRAST  TECHNIQUE: Multidetector CT imaging of the chest was performed using the standard protocol during bolus administration of intravenous  contrast. Multiplanar CT image reconstructions and MIPs were obtained to evaluate the vascular anatomy.  CONTRAST:  161m OMNIPAQUE IOHEXOL 350 MG/ML SOLN  COMPARISON:  None.  FINDINGS: The study is markedly limited by extensive streak artifacts from patient's large body habitus. No gross central pulmonary embolus is noted. There is fatty infiltration of the liver. No aortic aneurysm. Evaluation of lobar and segmental arterial branches is nondiagnostic for pulmonary embolus.  The visualized upper abdomen shows free air anterior abdomen please see axial image 59 highly suspicious for perforated viscus. Clinical correlation is necessary. Further correlation with CT scan of the abdomen and pelvis is recommended.  There is nodular enlargement of right lobe of thyroid gland measures 4.2 cm. Further correlation with thyroid gland ultrasound is recommended to exclude a mass. There is no mediastinal hematoma or adenopathy. No hilar adenopathy.  Images of the lung parenchyma shows no acute infiltrate or pulmonary edema.  Review of the MIP images confirms the above findings.  IMPRESSION: 1. Significant limited study by patient's large body habitus. No central pulmonary embolus is noted. 2. No acute infiltrate or pulmonary edema. No mediastinal hematoma or adenopathy. 3. There is free abdominal air in upper abdomen highly suspicious for perforated viscus. Clinical correlation is necessary. 4. There is 4.2 cm nodule lesion in right lobe of thyroid gland. Further evaluation with thyroid gland ultrasound is recommended. Critical Value/emergent results were called by telephone at the time of interpretation on 06/09/2015 at 6:02 pm to Dr. JNita Sells, who verbally acknowledged these results.   Electronically Signed   By: LLahoma CrockerM.D.   On: 06/09/2015 18:02   Dg Chest Port 1 View  06/09/2015   CLINICAL DATA:  Central line placement  EXAM: PORTABLE CHEST - 1 VIEW  COMPARISON:  08/22/2014  FINDINGS: Cardiomediastinal  silhouette is stable. Endotracheal tube in place with tip 4.8 cm above the carina. NG tube in place. There is right IJ central line with tip in SVC right atrium junction. No pulmonary edema. No segmental infiltrate. No pneumothorax.  IMPRESSION: Endotracheal and NG tube in place. Right IJ central line with tip in SVC right atrium junction. No pneumothorax.   Electronically Signed   By: LLahoma CrockerM.D.   On: 06/09/2015 23:04    EKG: NSR CXR: Clear on 9/16  ASSESSMENT / PLAN:  PULMONARY ETT 9/16 >>  Acute respiratory failure with hypoxemia Hx asthma (Dr AElsworth Soho  Severe OSA / OHS - Maintain on PRVC, favor continuing high PEEP for now to overcome pleural pressure for her habitus. Continue PEEP 10 until abd closed and approaching SBT / extubation - will need NIPPV qhs once extubated - hold off inhaled steroids for now in absence  wheezing - prn SABA  CARDIOVASCULAR CVL R IJ CVC 9/16 >>  R radial Art line 9/16 >>  A: hemodynamically stable  P:  - On home torsemide, holding currently   RENAL A:  Hyponatremia, improved P:   - follow BMP - continue current IVF, replacing wound vac losses  GASTROINTESTINAL A:  Perforation post-colonic biopsies Crohn's Disease, new diagnosis Peritonitis  S/p exp lap, drainage intra-abd abscess, partial colon resection 9/16 Open abdomen P:   - wound vac in place - plan back to OR 9/18 at 11:00 - appreciate GI assistance, recommend restarting immunosuppression when feasible post-op. Goal to start corticosteroids soon but would defer while she is still at high risk for fungal infection or intra-abd abscess. Possibly start stress-dose hydrocort on 9/19, will need to discuss with CCS  HEMATOLOGIC A:  Anemia of chronic disease P:  - follow CBC  INFECTIOUS A:  Peritonitis / peritoneal abscess P:   Urine 9/10 >> negative C diff 9/13 >> negative Peritoneal fluid 9/16 >>   Ceftriaxone x1 >> 9/10 Flagyl 9/11 >> 9/13 Flagyl 9/16 >>  Cipro 9/16 >>   -  Per notes has been on cipro + flagyl as an outpt as well  - Continue current abx, low threshold to add vanco or antifungals if clinical change  ENDOCRINE A:  At risk hyperglycemia P:   - start ICU SSI protocol - changed IVF from d5 0.45 NS to LR at low dose to replace wound vac losses.   NEUROLOGIC A:  Sedated for MV P:   RASS goal: -2 to -3 - Continue deep sedation while has open abdomen, goal lighten in next 24 h once closed. On propofol + fentanyl   TODAY'S SUMMARY: 34 y/o woman with super morbid obesity s/p ex-lap for perf following colonic biopsy and new diagnosis of Crohn's disease. Maintained on mechanical ventilation.  I have personally obtained a history, examined the patient, evaluated laboratory and imaging results, formulated the assessment and plan and placed orders.  CRITICAL CARE: The patient is critically ill with multiple organ systems failure and requires high complexity decision making for assessment and support, frequent evaluation and titration of therapies, application of advanced monitoring technologies and extensive interpretation of multiple databases. Critical Care Time devoted to patient care services described in this note is 35 minutes.   Baltazar Apo, MD, PhD 06/11/2015, 7:11 AM [facility] [phone] or if no answer [phone]

## 2015-06-11 NOTE — Anesthesia Postprocedure Evaluation (Signed)
  Anesthesia Post-op Note  Patient: Dominique [name]  Procedure(s) Performed: Procedure(s): RE-EXPLORATION OF ABDOMEN (N/A)  CREATION OF COLOSTOMY (N/A)  Patient Location: SICU  Anesthesia Type:General  Level of Consciousness: sedated and Patient remains intubated per anesthesia plan  Airway and Oxygen Therapy: Patient remains intubated per anesthesia plan and Patient placed on Ventilator (see vital sign flow sheet for setting)  Post-op Pain: none  Post-op Assessment: Post-op Vital signs reviewed, Patient's Cardiovascular Status Stable and Respiratory Function Stable              Post-op Vital Signs: Reviewed and stable  Last Vitals:  Filed Vitals:   06/11/15 1200  BP: 111/79  Pulse: 107  Temp:   Resp: 18    Complications: No apparent anesthesia complications

## 2015-06-11 NOTE — Progress Notes (Signed)
Transported pt to OR with OR team at this time without complications. Pt ventilated via AMBU by ETT with PEEP valve on +10cm H20.

## 2015-06-12 ENCOUNTER — Inpatient Hospital Stay (HOSPITAL_COMMUNITY): Payer: Medicaid Other

## 2015-06-12 ENCOUNTER — Encounter (HOSPITAL_COMMUNITY): Payer: Self-pay | Admitting: General Surgery

## 2015-06-12 DIAGNOSIS — J9600 Acute respiratory failure, unspecified whether with hypoxia or hypercapnia: Secondary | ICD-10-CM

## 2015-06-12 DIAGNOSIS — A419 Sepsis, unspecified organism: Secondary | ICD-10-CM

## 2015-06-12 DIAGNOSIS — R34 Anuria and oliguria: Secondary | ICD-10-CM

## 2015-06-12 LAB — HEPATIC FUNCTION PANEL
ALK PHOS: 115 U/L (ref 38–126)
ALT: 18 U/L (ref 14–54)
AST: 34 U/L (ref 15–41)
Albumin: 1.4 g/dL — ABNORMAL LOW (ref 3.5–5.0)
BILIRUBIN INDIRECT: 0.4 mg/dL (ref 0.3–0.9)
Bilirubin, Direct: 0.3 mg/dL (ref 0.1–0.5)
TOTAL PROTEIN: 4.9 g/dL — AB (ref 6.5–8.1)
Total Bilirubin: 0.7 mg/dL (ref 0.3–1.2)

## 2015-06-12 LAB — CBC
HEMATOCRIT: 31.5 % — AB (ref 36.0–46.0)
Hemoglobin: 9.1 g/dL — ABNORMAL LOW (ref 12.0–15.0)
MCH: 18.3 pg — ABNORMAL LOW (ref 26.0–34.0)
MCHC: 28.9 g/dL — AB (ref 30.0–36.0)
MCV: 63.4 fL — ABNORMAL LOW (ref 78.0–100.0)
PLATELETS: 240 10*3/uL (ref 150–400)
RBC: 4.97 MIL/uL (ref 3.87–5.11)
RDW: 21.9 % — AB (ref 11.5–15.5)
WBC: 18.5 10*3/uL — AB (ref 4.0–10.5)

## 2015-06-12 LAB — TROPONIN I: Troponin I: <0.03 ng/mL (ref <0.031)

## 2015-06-12 LAB — GLUCOSE, CAPILLARY
GLUCOSE-CAPILLARY: 15 mg/dL — AB (ref 65–99)
GLUCOSE-CAPILLARY: 111 mg/dL — AB (ref 65–99)
GLUCOSE-CAPILLARY: 119 mg/dL — AB (ref 65–99)
Glucose-Capillary: 110 mg/dL — ABNORMAL HIGH (ref 65–99)
Glucose-Capillary: 111 mg/dL — ABNORMAL HIGH (ref 65–99)
Glucose-Capillary: 90 mg/dL (ref 65–99)
Glucose-Capillary: 97 mg/dL (ref 65–99)
Glucose-Capillary: 99 mg/dL (ref 65–99)

## 2015-06-12 LAB — PROTIME-INR
INR: 1.6 — ABNORMAL HIGH (ref 0.00–1.49)
PROTHROMBIN TIME: 19.1 s — AB (ref 11.6–15.2)

## 2015-06-12 LAB — BASIC METABOLIC PANEL
ANION GAP: 5 (ref 5–15)
Anion gap: 7 (ref 5–15)
BUN: 11 mg/dL (ref 6–20)
BUN: 12 mg/dL (ref 6–20)
CALCIUM: 6.8 mg/dL — AB (ref 8.9–10.3)
CALCIUM: 6.9 mg/dL — AB (ref 8.9–10.3)
CHLORIDE: 101 mmol/L (ref 101–111)
CO2: 24 mmol/L (ref 22–32)
CO2: 24 mmol/L (ref 22–32)
CREATININE: 0.85 mg/dL (ref 0.44–1.00)
CREATININE: 0.87 mg/dL (ref 0.44–1.00)
Chloride: 106 mmol/L (ref 101–111)
GFR calc Af Amer: >60 mL/min (ref >60)
GFR calc non Af Amer: >60 mL/min (ref >60)
Glucose, Bld: 100 mg/dL — ABNORMAL HIGH (ref 65–99)
Glucose, Bld: 97 mg/dL (ref 65–99)
Potassium: 4.1 mmol/L (ref 3.5–5.1)
Potassium: 4.2 mmol/L (ref 3.5–5.1)
SODIUM: 132 mmol/L — AB (ref 135–145)
SODIUM: 135 mmol/L (ref 135–145)

## 2015-06-12 LAB — POCT I-STAT 3, ART BLOOD GAS (G3+)
BICARBONATE: 24.3 meq/L — AB (ref 20.0–24.0)
O2 Saturation: 99 %
PH ART: 7.418 (ref 7.350–7.450)
TCO2: 25 mmol/L (ref 0–100)
pCO2 arterial: 37.7 mmHg (ref 35.0–45.0)
pO2, Arterial: 135 mmHg — ABNORMAL HIGH (ref 80.0–100.0)

## 2015-06-12 LAB — PHOSPHORUS
PHOSPHORUS: 4.1 mg/dL (ref 2.5–4.6)
PHOSPHORUS: 4.3 mg/dL (ref 2.5–4.6)

## 2015-06-12 LAB — LACTIC ACID, PLASMA: LACTIC ACID, VENOUS: 1.6 mmol/L (ref 0.5–2.0)

## 2015-06-12 LAB — MAGNESIUM
MAGNESIUM: 1.7 mg/dL (ref 1.7–2.4)
MAGNESIUM: 1.8 mg/dL (ref 1.7–2.4)

## 2015-06-12 MED ORDER — INSULIN ASPART 100 UNIT/ML ~~LOC~~ SOLN
0.0000 [IU] | SUBCUTANEOUS | Status: DC
  Administered 2015-06-18 – 2015-06-20 (×5): 3 [IU] via SUBCUTANEOUS

## 2015-06-12 MED ORDER — DEXMEDETOMIDINE HCL IN NACL 200 MCG/50ML IV SOLN
0.0000 ug/kg/h | INTRAVENOUS | Status: DC
  Administered 2015-06-12 – 2015-06-13 (×11): 0.4 ug/kg/h via INTRAVENOUS
  Administered 2015-06-14: 0.2 ug/kg/h via INTRAVENOUS
  Administered 2015-06-14 (×2): 0.4 ug/kg/h via INTRAVENOUS
  Filled 2015-06-12 (×7): qty 50
  Filled 2015-06-12: qty 100
  Filled 2015-06-12 (×8): qty 50

## 2015-06-12 MED ORDER — SODIUM CHLORIDE 0.9 % IV BOLUS (SEPSIS)
1000.0000 mL | Freq: Once | INTRAVENOUS | Status: AC
  Administered 2015-06-12: 1000 mL via INTRAVENOUS

## 2015-06-12 MED ORDER — PHENYLEPHRINE HCL 10 MG/ML IJ SOLN
0.0000 ug/min | INTRAVENOUS | Status: DC
  Administered 2015-06-12: 40 ug/min via INTRAVENOUS
  Administered 2015-06-12 – 2015-06-13 (×2): 50 ug/min via INTRAVENOUS
  Administered 2015-06-13: 55 ug/min via INTRAVENOUS
  Administered 2015-06-13: 40 ug/min via INTRAVENOUS
  Administered 2015-06-13: 50 ug/min via INTRAVENOUS
  Administered 2015-06-14: 30 ug/min via INTRAVENOUS
  Filled 2015-06-12 (×8): qty 2

## 2015-06-12 MED ORDER — HEPARIN SODIUM (PORCINE) 5000 UNIT/ML IJ SOLN
5000.0000 [IU] | Freq: Three times a day (TID) | INTRAMUSCULAR | Status: DC
  Administered 2015-06-12 – 2015-06-26 (×42): 5000 [IU] via SUBCUTANEOUS
  Filled 2015-06-12 (×46): qty 1

## 2015-06-12 NOTE — Progress Notes (Signed)
eLink Physician-Brief Progress Note Patient Name: Dominique [name] DOB: October 27, 1980 MRN: [id number]     Lab review  PULMONARY  Recent Labs Lab 06/10/15 1218 06/11/15 2359  PHART 7.415 7.418  PCO2ART 47.0* 37.7  PO2ART 171.0* 135.0*  HCO3 30.1* 24.3*  TCO2 32 25  O2SAT 100.0 99.0    CBC  Recent Labs Lab 06/10/15 0432 06/11/15 0419 06/11/15 2345  HGB 8.8* 8.8* 9.5*  HCT 29.0* 28.6* 32.5*  WBC 18.9* 8.0 17.1*  PLT 348 274 246    COAGULATION  Recent Labs Lab 06/11/15 2345  INR 1.60*    CARDIAC  No results for input(s): TROPONINI in the last 168 hours. No results for input(s): PROBNP in the last 168 hours.   CHEMISTRY  Recent Labs Lab 06/08/15 0535 06/09/15 1325 06/10/15 0432 06/11/15 0419 06/11/15 2345  NA 136 133* 132* 136 132*  K 4.1 4.1 4.2 3.9 4.1  CL 105 101 100* 104 101  CO2 26 21* 26 27 24   GLUCOSE 116* 112* 102* 91 97  BUN <5* 9 10 7 11   CREATININE 0.50 1.09* 0.58 0.55 0.85  CALCIUM 8.1* 8.3* 7.6* 7.5* 6.8*  MG  --   --   --  1.8  --    Estimated Creatinine Clearance: 141.3 mL/min (by C-G formula based on Cr of 0.85).   LIVER  Recent Labs Lab 06/08/15 0535 06/09/15 1325 06/11/15 2345  AST 20 44* 34  ALT 13* 17 18  ALKPHOS 79 95 115  BILITOT 0.6 0.6 0.7  PROT 6.2* 6.3* 4.9*  ALBUMIN 2.2* 2.1* 1.4*  INR  --   --  1.60*     INFECTIOUS  Recent Labs Lab 06/11/15 2345  LATICACIDVEN 1.6     ENDOCRINE CBG (last 3)   Recent Labs  06/11/15 2031 06/11/15 2034 06/11/15 2341  GLUCAP 15* 85 97         IMAGING x48h  - image(s) personally visualized  -   highlighted in bold No results found.     A Likely hemoconcentrated  PLAN 1 more liter saline bolus    Intervention Category Intermediate Interventions: Diagnostic test evaluation  RAMASWAMY,MURALI 06/12/2015, 12:44 AM

## 2015-06-12 NOTE — Progress Notes (Addendum)
1 Day Post-Op  Subjective: Had colostomy and abd closure yesterday. Tachycardiac, hypotensive, oligouria since surgery. Been getting fluid boluses. Sedation lightened. CVP 6 this am  Objective: Vital signs in last 24 hours: Temp:  [98.5 F (36.9 C)-100 F (37.8 C)] 99.4 F (37.4 C) (09/19 0700) Pulse Rate:  [106-148] 141 (09/19 0700) Resp:  [14-23] 18 (09/19 0700) BP: (73-131)/(35-86) 73/35 mmHg (09/19 0700) SpO2:  [89 %-100 %] 100 % (09/19 0700) Arterial Line BP: (82-357)/(64-137) 82/69 mmHg (09/18 1830) FiO2 (%):  [30 %] 30 % (09/19 0400) Last BM Date: 06/08/15  Intake/Output from previous day: 09/18 0701 - 09/19 0700 In: 8208.9 [I.V.:4478.9; NG/GT:30; IV Piggyback:3700] Out: 1550 [Urine:1055; Emesis/NG output:270; Drains:125; Blood:100] Intake/Output this shift:    Intubated, sedated, OE to voice cta b/l but difficult secondary to body habitus Tachy Soft, obese, dressing ok. Ostomy - edematous, a little dark but viable, no air in bag  Lab Results:   Recent Labs  06/11/15 2345 06/12/15 0341  WBC 17.1* 18.5*  HGB 9.5* 9.1*  HCT 32.5* 31.5*  PLT 246 240   BMET  Recent Labs  06/11/15 2345 06/12/15 0341  NA 132* 135  K 4.1 4.2  CL 101 106  CO2 24 24  GLUCOSE 97 100*  BUN 11 12  CREATININE 0.85 0.87  CALCIUM 6.8* 6.9*   PT/INR  Recent Labs  06/11/15 2345  LABPROT 19.1*  INR 1.60*   ABG  Recent Labs  06/10/15 1218 06/11/15 2359  PHART 7.415 7.418  HCO3 30.1* 24.3*    Studies/Results: Dg Chest Port 1 View  06/12/2015   CLINICAL DATA:  Respiratory failure.  EXAM: PORTABLE CHEST - 1 VIEW  COMPARISON:  06/09/2015.  FINDINGS: Patient is rotated to the left. Endotracheal tube and NG tube in stable position. Mediastinum hilar structures are normal. Cardiomegaly with normal pulmonary vascularity. Low lung volumes. No prominent pleural effusion. No pneumothorax.  IMPRESSION: 1. Lines and tubes in stable position. 2. Stable cardiomegaly. 3. Low lung  volumes.   Electronically Signed   By: Marcello Moores  Register   On: 06/12/2015 07:32    Anti-infectives: Anti-infectives    Start     Dose/Rate Route Frequency Ordered Stop   06/10/15 1800  ciprofloxacin (CIPRO) IVPB 400 mg     400 mg 200 mL/hr over 60 Minutes Intravenous Every 12 hours 06/10/15 1734     06/09/15 1927  ciprofloxacin (CIPRO) 400 MG/200ML IVPB    Comments:  Ubaldo Glassing   : cabinet override      06/09/15 1927 06/09/15 1953   06/09/15 1830  ciprofloxacin (CIPRO) IVPB 400 mg  Status:  Discontinued     400 mg 200 mL/hr over 60 Minutes Intravenous  Once 06/09/15 1825 06/10/15 1734   06/09/15 1830  metroNIDAZOLE (FLAGYL) IVPB 500 mg     500 mg 100 mL/hr over 60 Minutes Intravenous Every 8 hours 06/09/15 1825     06/04/15 1200  metroNIDAZOLE (FLAGYL) IVPB 500 mg  Status:  Discontinued     500 mg 100 mL/hr over 60 Minutes Intravenous Every 8 hours 06/04/15 1034 06/07/15 1405   06/04/15 0015  cefTRIAXone (ROCEPHIN) 1 g in dextrose 5 % 50 mL IVPB     1 g 100 mL/hr over 30 Minutes Intravenous  Once 06/04/15 0012 06/04/15 0108      Assessment/Plan: s/p Procedure(s): RE-EXPLORATION OF ABDOMEN (N/A)  CREATION OF COLOSTOMY (N/A)  Seems a little volume down given CVP.  Will give another 1L o/w defer to CCM Cont bowel  rest, NG to LIWS Ostomy care Wet - dry dressing bid to abdominal wound Ok with chemical VTE prophylaxis  Cont IV abx  Leighton Ruff. Redmond Pulling, MD, FACS General, Bariatric, & Minimally Invasive Surgery [facility], [place]   LOS: 8 days    Gayland Curry 06/12/2015

## 2015-06-12 NOTE — Consult Note (Signed)
WOC ostomy consult note Stoma type/location: LLQ, end colostomy Stomal assessment/size: aprox. 1 3/4" round, budded, pink, moist Peristomal assessment: pouch intact from OR 06/11/15 Treatment options for stomal/peristomal skin: NA Output bloody Ostomy pouching: 2pc. 2 3/4" in place and intact from OR yesterday Education provided: Pt on the vent and slightly sedated, will open eyes. No family in the room Enrolled patient in [place] program:No Ostomy supplies ordered to the room.  Will follow up every couple of days to determine educational needs When appropriate.  WOC will follow along with you for continued support with ostomy teaching and care [facility] RN,CWOCN [phone]

## 2015-06-12 NOTE — Progress Notes (Signed)
PULMONARY  / CRITICAL CARE MEDICINE CONSULTATION   Name: Dominique [name] MRN: [id number] DOB: Oct 05, 1980    ADMISSION DATE:  06/03/2015 CONSULTATION DATE: 06/09/15  PRIMARY SERVICE: Surgery  CHIEF COMPLAINT: Abdominal pain.  BRIEF PATIENT DESCRIPTION:  34 yo female with hx of colitis presented with severe abdominal pain, diarrhea, and vomiting.  SIGNIFICANT EVENTS: 9/10 Admit 9/11 [facility] consulted 9/14 started steroids for Crohn's colitis 9/16 Free air in abdomen from perforated descending colon >> surgery consulted >> laparotomy, drain of abscess, partial colectomy, wound vac; remained on vent post op 9/18 To OR >> wound closed, colostomy created  STUDIES:  9/12 colonoscopy >> severe chronic inflammatory changes in proximal sigmoid and descending colon  SUBJECTIVE: Oliguria overnight.    VITAL SIGNS: Temp:  [98.5 F (36.9 C)-100 F (37.8 C)] 99.4 F (37.4 C) (09/19 0700) Pulse Rate:  [106-148] 127 (09/19 0900) Resp:  [14-23] 19 (09/19 0900) BP: (71-131)/(23-86) 71/23 mmHg (09/19 0900) SpO2:  [89 %-100 %] 100 % (09/19 0900) Arterial Line BP: (82-357)/(64-137) 82/69 mmHg (09/18 1830) FiO2 (%):  [30 %] 30 % (09/19 0810) HEMODYNAMICS: CVP:  [6 mmHg-12 mmHg] 6 mmHg VENTILATOR SETTINGS: Vent Mode:  [-] PRVC FiO2 (%):  [30 %] 30 % Set Rate:  [18 bmp] 18 bmp Vt Set:  [420 mL] 420 mL PEEP:  [10 cmH20] 10 cmH20 Plateau Pressure:  [23 cmH20-28 cmH20] 28 cmH20 INTAKE / OUTPUT: Intake/Output      09/18 0701 - 09/19 0700 09/19 0701 - 09/20 0700   I.V. (mL/kg) 4630.9 (27.7) 275.1 (1.6)   NG/GT 30 30   IV Piggyback 3700    Total Intake(mL/kg) 8360.9 (50.1) 305.1 (1.8)   Urine (mL/kg/hr) 1055 (0.3) 10 (0)   Emesis/NG output 270 (0.1)    Drains 125 (0)    Blood 100 (0)    Total Output 1550 10   Net +6810.9 +295.1          PHYSICAL EXAMINATION: General: Sedated Neuro: RASS -1, open's eyes with stimulation HEENT:  ETT in place Cardiovascular: regular, tachycardic Lungs:  decreased BS, scattered rhonchi Abdomen: soft, wound dressing, colostomy in place Musculoskeletal: 1+ edema Skin: no rashes  LABS:  CBC  Recent Labs Lab 06/11/15 0419 06/11/15 2345 06/12/15 0341  WBC 8.0 17.1* 18.5*  HGB 8.8* 9.5* 9.1*  HCT 28.6* 32.5* 31.5*  PLT 274 246 240   Coag's  Recent Labs Lab 06/11/15 2345  INR 1.60*   BMET  Recent Labs Lab 06/11/15 0419 06/11/15 2345 06/12/15 0341  NA 136 132* 135  K 3.9 4.1 4.2  CL 104 101 106  CO2 27 24 24   BUN 7 11 12   CREATININE 0.55 0.85 0.87  GLUCOSE 91 97 100*   Electrolytes  Recent Labs Lab 06/11/15 0419 06/11/15 2345 06/12/15 0341  CALCIUM 7.5* 6.8* 6.9*  MG 1.8 1.8 1.7  PHOS  --  4.1 4.3   Sepsis Markers  Recent Labs Lab 06/11/15 2345  LATICACIDVEN 1.6   ABG  Recent Labs Lab 06/10/15 1218 06/11/15 2359  PHART 7.415 7.418  PCO2ART 47.0* 37.7  PO2ART 171.0* 135.0*   Liver Enzymes  Recent Labs Lab 06/08/15 0535 06/09/15 1325 06/11/15 2345  AST 20 44* 34  ALT 13* 17 18  ALKPHOS 79 95 115  BILITOT 0.6 0.6 0.7  ALBUMIN 2.2* 2.1* 1.4*   Cardiac Enzymes  Recent Labs Lab 06/11/15 2345  TROPONINI <0.03   Glucose  Recent Labs Lab 06/11/15 1153 06/11/15 1631 06/11/15 2031 06/11/15 2034 06/11/15 2341 06/12/15  [place] 91 15* 85 97 111*    Imaging Dg Chest Port 1 View  06/12/2015   CLINICAL DATA:  Respiratory failure.  EXAM: PORTABLE CHEST - 1 VIEW  COMPARISON:  06/09/2015.  FINDINGS: Patient is rotated to the left. Endotracheal tube and NG tube in stable position. Mediastinum hilar structures are normal. Cardiomegaly with normal pulmonary vascularity. Low lung volumes. No prominent pleural effusion. No pneumothorax.  IMPRESSION: 1. Lines and tubes in stable position. 2. Stable cardiomegaly. 3. Low lung volumes.   Electronically Signed   By: Marcello Moores  Register   On: 06/12/2015 07:32    ASSESSMENT / PLAN:  PULMONARY ETT 9/16 >> A: Acute respiratory failure in  setting of sepsis and peritonitis. Hx of Asthma, OSA, OHS. P: Full vent support until abdominal process stable F/u CXR Albuterol prn  CARDIOVASCULAR Rt IJ CVL 9/16 >> A:  Hypovolemia, sinus tachycardia. P:  Continue IV fluids Goal CVP 8 to 12  RENAL A:   Oliguria >> likely from hypovolemia. P:   Monitor renal fx, urine outpt  GASTROINTESTINAL A:   Perforated descending colon with peritonitis s/p partial colectomy. New dx of Crohn's colitis 06/07/15. Morbid obesity. P:   Post-op care, nutrition per CCS Protonix for SUP Will likely need to restart steroids for colitis when okay with CCS  HEMATOLOGIC A:   Anemia of critical illness. P:  F/u CBC SQ heparin for DVT prevention  INFECTIOUS A:   Sepsis from peritonitis. P:   Day 4 of cipro, flagyl  Peritoneal fluid 9/16 >>   ENDOCRINE A:   Hyperglycemia. P:   SSI   NEUROLOGIC A: Acute encephalopathy 2nd to sepsis and peritonitis. P:   RASS goal -2 >> limit sedation once okay with surgery   CC time 42 minutes.  Chesley Mires, MD [facility] Pulmonary/Critical Care 06/12/2015, 9:47 AM Pager:  [phone] After 3pm call: [phone]

## 2015-06-12 NOTE — Progress Notes (Signed)
MD paged concerning low BP despite 2 liters of fluid. UOP remains low and CVP is 6. Orders received to give 1L bolus and if BP continues to remain low order Neo-Synephrine for MAP goal >65.  Will continue to monitor pt closely.

## 2015-06-12 NOTE — Progress Notes (Signed)
RT set up a intra-abdominal pressure bag per MD order.

## 2015-06-12 NOTE — Progress Notes (Signed)
CCM MD notified with lab results, and bladder pressure of 16.  Orders received for fluid bolus. Will continue to monitor.

## 2015-06-12 NOTE — Progress Notes (Signed)
[facility] Progress Note Patient Name: Dominique [name] DOB: 05-Apr-1981 MRN: [id number]   Date of Service  06/12/2015  HPI/Events of Note  Oliguria and HR improved after fluids but now worsening  eICU Interventions  1l flid bolius     Intervention Category Intermediate Interventions: Oliguria - evaluation and management  RAMASWAMY,MURALI 06/12/2015, 4:24 AM

## 2015-06-12 NOTE — Care Management Note (Signed)
Case Management Note  Patient Details  Name: Jazzmyne Rasnick MRN: [id number] Date of Birth: 06-04-1981  Subjective/Objective:  Post op repair or perforated viscous on 9-16, return to OR for exploration on 9-18.  Has Vac and remains on vent.                   Action/Plan:   Expected Discharge Date:                  Expected Discharge Plan:  Home/Self Care  In-House Referral:  NA  Discharge planning Services  CM Consult  Post Acute Care Choice:    Choice offered to:  NA  DME Arranged:    DME Agency:     HH Arranged:    HH Agency:     Status of Service:  In process, will continue to follow  Medicare Important Message Given:    Date Medicare IM Given:    Medicare IM give by:    Date Additional Medicare IM Given:    Additional Medicare Important Message give by:     If discussed at [place] of Stay Meetings, dates discussed:    Additional Comments:  Vergie Living, RN 06/12/2015, 11:02 AM

## 2015-06-12 NOTE — Progress Notes (Signed)
Dr. Halford Chessman paged concerning decreased BP and decreased UOP despite 1L fluid bolus and decreasing sedation. Orders received to give another 1L fluid bolus and change Propofol to Precedex. Will continue to monitor pt closely.

## 2015-06-13 ENCOUNTER — Inpatient Hospital Stay (HOSPITAL_COMMUNITY): Payer: Medicaid Other

## 2015-06-13 DIAGNOSIS — N179 Acute kidney failure, unspecified: Secondary | ICD-10-CM

## 2015-06-13 LAB — TYPE AND SCREEN
ABO/RH(D): O POS
ANTIBODY SCREEN: NEGATIVE
Unit division: 0
Unit division: 0

## 2015-06-13 LAB — GLUCOSE, CAPILLARY
GLUCOSE-CAPILLARY: 93 mg/dL (ref 65–99)
GLUCOSE-CAPILLARY: 84 mg/dL (ref 65–99)
Glucose-Capillary: 101 mg/dL — ABNORMAL HIGH (ref 65–99)
Glucose-Capillary: 83 mg/dL (ref 65–99)
Glucose-Capillary: 91 mg/dL (ref 65–99)
Glucose-Capillary: 93 mg/dL (ref 65–99)

## 2015-06-13 LAB — BASIC METABOLIC PANEL
ANION GAP: 7 (ref 5–15)
BUN: 21 mg/dL — AB (ref 6–20)
CALCIUM: 6.8 mg/dL — AB (ref 8.9–10.3)
CO2: 21 mmol/L — AB (ref 22–32)
Chloride: 104 mmol/L (ref 101–111)
Creatinine, Ser: 1.87 mg/dL — ABNORMAL HIGH (ref 0.44–1.00)
GFR calc Af Amer: 39 mL/min — ABNORMAL LOW (ref >60)
GFR, EST NON AFRICAN AMERICAN: 34 mL/min — AB (ref >60)
GLUCOSE: 121 mg/dL — AB (ref 65–99)
Potassium: 4.2 mmol/L (ref 3.5–5.1)
Sodium: 132 mmol/L — ABNORMAL LOW (ref 135–145)

## 2015-06-13 LAB — CBC
HEMATOCRIT: 31.1 % — AB (ref 36.0–46.0)
Hemoglobin: 8.8 g/dL — ABNORMAL LOW (ref 12.0–15.0)
MCH: 18 pg — AB (ref 26.0–34.0)
MCHC: 28.3 g/dL — AB (ref 30.0–36.0)
MCV: 63.7 fL — AB (ref 78.0–100.0)
PLATELETS: 209 10*3/uL (ref 150–400)
RBC: 4.88 MIL/uL (ref 3.87–5.11)
RDW: 21.5 % — AB (ref 11.5–15.5)
WBC: 18.2 10*3/uL — ABNORMAL HIGH (ref 4.0–10.5)

## 2015-06-13 LAB — MRSA PCR SCREENING: MRSA by PCR: NEGATIVE

## 2015-06-13 LAB — BODY FLUID CULTURE: Culture: NO GROWTH

## 2015-06-13 NOTE — Progress Notes (Signed)
Patient ID: Dominique [name], female   DOB: 1980-11-01, 34 y.o.   MRN: [id number] 2 Days Post-Op  Subjective: Pt intubated.  Does open her eyes to her name.  Still not making much urine.  On 40 of neo  Objective: Vital signs in last 24 hours: Temp:  [97.5 F (36.4 C)-99.3 F (37.4 C)] 97.5 F (36.4 C) (09/20 0717) Pulse Rate:  [71-130] 79 (09/20 0838) Resp:  [15-22] 16 (09/20 0838) BP: (56-130)/(22-77) 106/69 mmHg (09/20 0838) SpO2:  [95 %-100 %] 100 % (09/20 0838) FiO2 (%):  [30 %] 30 % (09/20 0838) Last BM Date: 06/08/15  Intake/Output from previous day: 09/19 0701 - 09/20 0700 In: 4226.3 [I.V.:3676.3; NG/GT:150; IV Piggyback:400] Out: 590 [Urine:300; Emesis/NG output:230; Stool:60] Intake/Output this shift: Total I/O In: 225.1 [I.V.:195.1; NG/GT:30] Out: 10 [Urine:10]  PE: Abd: soft, obese, NGT with minimal bilious output, hypoactive BS, ostomy with no output except minimal sweat.  Stoma is dusky.  Wound is clean and packed.  Lab Results:   Recent Labs  06/12/15 0341 06/13/15 0410  WBC 18.5* 18.2*  HGB 9.1* 8.8*  HCT 31.5* 31.1*  PLT 240 209   BMET  Recent Labs  06/12/15 0341 06/13/15 0410  NA 135 132*  K 4.2 4.2  CL 106 104  CO2 24 21*  GLUCOSE 100* 121*  BUN 12 21*  CREATININE 0.87 1.87*  CALCIUM 6.9* 6.8*   PT/INR  Recent Labs  06/11/15 2345  LABPROT 19.1*  INR 1.60*   CMP     Component Value Date/Time   NA 132* 06/13/2015 0410   K 4.2 06/13/2015 0410   CL 104 06/13/2015 0410   CO2 21* 06/13/2015 0410   GLUCOSE 121* 06/13/2015 0410   BUN 21* 06/13/2015 0410   CREATININE 1.87* 06/13/2015 0410   CREATININE 0.52 12/29/2014 1557   CALCIUM 6.8* 06/13/2015 0410   PROT 4.9* 06/11/2015 2345   ALBUMIN 1.4* 06/11/2015 2345   AST 34 06/11/2015 2345   ALT 18 06/11/2015 2345   ALKPHOS 115 06/11/2015 2345   BILITOT 0.7 06/11/2015 2345   GFRNONAA 34* 06/13/2015 0410   GFRAA 39* 06/13/2015 0410   Lipase     Component Value Date/Time   LIPASE 10*  06/09/2015 1325       Studies/Results: Dg Chest Port 1 View  06/13/2015   CLINICAL DATA:  Respiratory failure.  EXAM: PORTABLE CHEST - 1 VIEW  COMPARISON:  None.  FINDINGS: Endotracheal tube, NG tube, right IJ line in stable position. Cardiomegaly with normal pulmonary vascularity. Low lung volumes. No pleural effusion or pneumothorax.  IMPRESSION: 1. Lines and tubes in stable position. 2. Stable cardiomegaly. 3. Stable low lung volumes.  No focal alveolar infiltrate.   Electronically Signed   By: Marcello Moores  Register   On: 06/13/2015 07:50   Dg Chest Port 1 View  06/12/2015   CLINICAL DATA:  Respiratory failure.  EXAM: PORTABLE CHEST - 1 VIEW  COMPARISON:  06/09/2015.  FINDINGS: Patient is rotated to the left. Endotracheal tube and NG tube in stable position. Mediastinum hilar structures are normal. Cardiomegaly with normal pulmonary vascularity. Low lung volumes. No prominent pleural effusion. No pneumothorax.  IMPRESSION: 1. Lines and tubes in stable position. 2. Stable cardiomegaly. 3. Low lung volumes.   Electronically Signed   By: Marcello Moores  Register   On: 06/12/2015 07:32    Anti-infectives: Anti-infectives    Start     Dose/Rate Route Frequency Ordered Stop   06/10/15 1800  ciprofloxacin (CIPRO) IVPB 400 mg  400 mg 200 mL/hr over 60 Minutes Intravenous Every 12 hours 06/10/15 1734     06/09/15 1927  ciprofloxacin (CIPRO) 400 MG/200ML IVPB    Comments:  Ubaldo Glassing   : cabinet override      06/09/15 1927 06/09/15 1953   06/09/15 1830  ciprofloxacin (CIPRO) IVPB 400 mg  Status:  Discontinued     400 mg 200 mL/hr over 60 Minutes Intravenous  Once 06/09/15 1825 06/10/15 1734   06/09/15 1830  metroNIDAZOLE (FLAGYL) IVPB 500 mg     500 mg 100 mL/hr over 60 Minutes Intravenous Every 8 hours 06/09/15 1825     06/04/15 1200  metroNIDAZOLE (FLAGYL) IVPB 500 mg  Status:  Discontinued     500 mg 100 mL/hr over 60 Minutes Intravenous Every 8 hours 06/04/15 1034 06/07/15 1405   06/04/15  0015  cefTRIAXone (ROCEPHIN) 1 g in dextrose 5 % 50 mL IVPB     1 g 100 mL/hr over 30 Minutes Intravenous  Once 06/04/15 0012 06/04/15 0108       Assessment/Plan POD 2,4 s/p ex lap with DRAINAGE OF INTRA-ABDOMINAL ABSCESS, PARTIAL COLON RESECTION, re-exploration and creation of colostomy and abdominal closure -cont BID dressing changes -cont abx therapy -routine ostomy care -cont NGT until return of bowel function or while intubated -no plans for return to OR.  May wean to extubate as CCM sees fit ARF -still with minimal UOP.  Defer to CCM VDRF -per CCM DVT Prophylaxis -SCDs/heparin    LOS: 9 days    OSBORNE,KELLY E 06/13/2015, 8:43 AM Pager: [phone]

## 2015-06-13 NOTE — Progress Notes (Signed)
Will hold off on extubating pt today per Dr. Halford Chessman due to negative cuff leak.  Increased PS to 8 at this time for pt comfort.  Pt tolerating well, RT will monitor.

## 2015-06-13 NOTE — Progress Notes (Signed)
PULMONARY  / CRITICAL CARE MEDICINE CONSULTATION   Name: Dominique [name] MRN: [id number] DOB: [birth year]    ADMISSION DATE:  06/03/2015 CONSULTATION DATE: 06/09/15  PRIMARY SERVICE: Surgery  CHIEF COMPLAINT: Abdominal pain.  BRIEF PATIENT DESCRIPTION:  34 yo female with hx of colitis presented with severe abdominal pain, diarrhea, and vomiting.  SIGNIFICANT EVENTS: 9/10 Admit 9/11 [facility] consulted 9/14 started steroids for Crohn's colitis 9/16 Free air in abdomen from perforated descending colon >> surgery consulted >> laparotomy, drain of abscess, partial colectomy, wound vac; remained on vent post op 9/18 To OR >> wound closed, colostomy created 9/19 added pressors  STUDIES:  9/12 colonoscopy >> severe chronic inflammatory changes in proximal sigmoid and descending colon >> active chronic colitis with abundant granulomata  SUBJECTIVE: Started on pressors yesterday.  Remains oliguric.  Tolerated pressure support weaning.  VITAL SIGNS: Temp:  [97.5 F (36.4 C)-99.3 F (37.4 C)] 97.5 F (36.4 C) (09/20 0717) Pulse Rate:  [71-135] 73 (09/20 0600) Resp:  [15-22] 18 (09/20 0600) BP: (56-130)/(22-77) 117/73 mmHg (09/20 0600) SpO2:  [95 %-100 %] 100 % (09/20 0600) FiO2 (%):  [30 %] 30 % (09/20 0400) HEMODYNAMICS: CVP:  [7 mmHg-13 mmHg] 10 mmHg VENTILATOR SETTINGS: Vent Mode:  [-] PRVC FiO2 (%):  [30 %] 30 % Set Rate:  [18 bmp] 18 bmp Vt Set:  [420 mL] 420 mL PEEP:  [8 cmH20-10 cmH20] 8 cmH20 Plateau Pressure:  [16 cmH20-28 cmH20] 16 cmH20 INTAKE / OUTPUT: Intake/Output      09/19 0701 - 09/20 0700 09/20 0701 - 09/21 0700   I.V. (mL/kg) 3676.3 (22)    NG/GT 150    IV Piggyback 400    Total Intake(mL/kg) 4226.3 (25.3)    Urine (mL/kg/hr) 290 (0.1)    Emesis/NG output 230 (0.1)    Drains     Stool 60 (0)    Blood     Total Output 580     Net +3646.3            PHYSICAL EXAMINATION: General: Sedated Neuro: RASS -1, open's eyes with stimulation, moves  extremities HEENT:  ETT in place Cardiovascular: regular, no murmur Lungs: no wheeze Abdomen: soft, wound dressing clean, colostomy in place Musculoskeletal: 1+ edema, extremities cool Skin: no rashes  LABS:  CBC  Recent Labs Lab 06/11/15 2345 06/12/15 0341 06/13/15 0410  WBC 17.1* 18.5* 18.2*  HGB 9.5* 9.1* 8.8*  HCT 32.5* 31.5* 31.1*  PLT 246 240 209   Coag's  Recent Labs Lab 06/11/15 2345  INR 1.60*   BMET  Recent Labs Lab 06/11/15 2345 06/12/15 0341 06/13/15 0410  NA 132* 135 132*  K 4.1 4.2 4.2  CL 101 106 104  CO2 24 24 21*  BUN 11 12 21*  CREATININE 0.85 0.87 1.87*  GLUCOSE 97 100* 121*   Electrolytes  Recent Labs Lab 06/11/15 0419 06/11/15 2345 06/12/15 0341 06/13/15 0410  CALCIUM 7.5* 6.8* 6.9* 6.8*  MG 1.8 1.8 1.7  --   PHOS  --  4.1 4.3  --    Sepsis Markers  Recent Labs Lab 06/11/15 2345  LATICACIDVEN 1.6   ABG  Recent Labs Lab 06/10/15 1218 06/11/15 2359  PHART 7.415 7.418  PCO2ART 47.0* 37.7  PO2ART 171.0* 135.0*   Liver Enzymes  Recent Labs Lab 06/08/15 0535 06/09/15 1325 06/11/15 2345  AST 20 44* 34  ALT 13* 17 18  ALKPHOS 79 95 115  BILITOT 0.6 0.6 0.7  ALBUMIN 2.2* 2.1* 1.4*   Cardiac Enzymes  Recent Labs Lab 06/11/15 2345  TROPONINI <0.03   Glucose  Recent Labs Lab 06/12/15 0735 06/12/15 1143 06/12/15 1703 06/12/15 2011 06/12/15 2346 06/13/15 0429  GLUCAP 111* 90 119* 110* 111* 93    Imaging Dg Chest Port 1 View  06/13/2015   CLINICAL DATA:  Respiratory failure.  EXAM: PORTABLE CHEST - 1 VIEW  COMPARISON:  None.  FINDINGS: Endotracheal tube, NG tube, right IJ line in stable position. Cardiomegaly with normal pulmonary vascularity. Low lung volumes. No pleural effusion or pneumothorax.  IMPRESSION: 1. Lines and tubes in stable position. 2. Stable cardiomegaly. 3. Stable low lung volumes.  No focal alveolar infiltrate.   Electronically Signed   By: Marcello Moores  Register   On: 06/13/2015 07:50    Dg Chest Port 1 View  06/12/2015   CLINICAL DATA:  Respiratory failure.  EXAM: PORTABLE CHEST - 1 VIEW  COMPARISON:  06/09/2015.  FINDINGS: Patient is rotated to the left. Endotracheal tube and NG tube in stable position. Mediastinum hilar structures are normal. Cardiomegaly with normal pulmonary vascularity. Low lung volumes. No prominent pleural effusion. No pneumothorax.  IMPRESSION: 1. Lines and tubes in stable position. 2. Stable cardiomegaly. 3. Low lung volumes.   Electronically Signed   By: Marcello Moores  Register   On: 06/12/2015 07:32    ASSESSMENT / PLAN:  PULMONARY ETT 9/16 >> A: Acute respiratory failure in setting of sepsis and peritonitis. Hx of Asthma, OSA, OHS. P: Adjust PEEP/FiO2 to keep SpO2 > 92% Pressure support wean as tolerated F/u CXR Albuterol prn  CARDIOVASCULAR Rt IJ CVL 9/16 >> A:  Hypovolemia, sinus tachycardia. P:  Continue IV fluids Goal CVP 8 to 12 Wean off pressors to keep MAP > 65  RENAL A:   Oliguria, AKI >> likely from hypovolemia. P:   Monitor renal fx, urine outpt  GASTROINTESTINAL A:   Perforated descending colon with peritonitis s/p partial colectomy. New dx of Crohn's colitis 06/07/15. Morbid obesity. P:   Post-op care, nutrition per CCS Protonix for SUP Will likely need to restart steroids for colitis when okay with CCS F/u pathology from 9/16  HEMATOLOGIC A:   Anemia of critical illness. P:  F/u CBC SQ heparin for DVT prevention  INFECTIOUS A:   Sepsis from peritonitis. P:   Day 5 of cipro, flagyl  Peritoneal fluid 9/16 >>   ENDOCRINE A:   Hyperglycemia. P:   SSI   NEUROLOGIC A: Acute encephalopathy 2nd to sepsis and peritonitis. P:   RASS goal 0  SUMMARY: Respiratory mechanics favorable for vent weaning >> will need to discuss with surgery if she needs continued vent support to maintain sedation/analgesia and allow more time for abdominal healing.  CC time 33 minutes.  Chesley Mires, MD [facility]  Pulmonary/Critical Care 06/13/2015, 8:01 AM Pager:  [phone] After 3pm call: [phone]

## 2015-06-13 NOTE — Progress Notes (Addendum)
Nutrition Follow-up  DOCUMENTATION CODES:   Severe malnutrition in context of acute illness/injury, Morbid obesity  INTERVENTION:    If prolonged intubation to continue, initiate nutrition support (TF vs TPN) in next 24 hours  NUTRITION DIAGNOSIS:   Inadequate oral intake related to inability to eat as evidenced by NPO status, ongoing  GOAL:   Provide needs based on ASPEN/SCCM guidelines, currently unmet  MONITOR:   Vent status, Weight trends, Labs, I & O's  ASSESSMENT:   Patient is a 34 year old female with history of asthma and 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 Pt with recurrent colitis.   Pt with Crohn's disease found to be free air.   Pt s/p procedures 9/16: EXPLORATORY LAPAROTOMY DRAINAGE OF INTRA-ABDOMINAL ABSCESS PARTIAL COLON RESECTION APPLICATION OF WOUND VAC  Patient is currently intubated on ventilator support -- NGT to LIS MV: 7.1 L/min Temp (24hrs), Avg:98.3 F (36.8 C), Min:97.5 F (36.4 C), Max:99.3 F (37.4 C)   Propofol discontinued 9/19.  CCM note reviewed.  Extubation held off today due to negative cuff leak.  Per RN, possible extubation tomorrow.  Diet Order:  Diet NPO time specified  Skin:  Wound (see comment) (Open abdomen wound VAC)  Last BM:  9/15  Height:   Ht Readings from Last 1 Encounters:  06/03/15 5' 2"  (1.575 m)    Weight:   Wt Readings from Last 1 Encounters:  06/03/15 368 lb 3 oz (167.009 kg)    Ideal Body Weight:  50 kg  BMI:  Body mass index is 67.33 kg/(m^2).  Estimated Nutritional Needs:   Kcal:  1100-1250  Protein:  125-140 grams  Fluid:  Per MD  EDUCATION NEEDS:   Education needs no appropriate at this time  Arthur Holms, RD, LDN Pager #: [phone] After-Hours Pager #: [phone]

## 2015-06-14 ENCOUNTER — Inpatient Hospital Stay (HOSPITAL_COMMUNITY): Payer: Medicaid Other

## 2015-06-14 LAB — BASIC METABOLIC PANEL
Anion gap: 7 (ref 5–15)
BUN: 29 mg/dL — AB (ref 6–20)
CHLORIDE: 103 mmol/L (ref 101–111)
CO2: 21 mmol/L — ABNORMAL LOW (ref 22–32)
Calcium: 6.9 mg/dL — ABNORMAL LOW (ref 8.9–10.3)
Creatinine, Ser: 2.64 mg/dL — ABNORMAL HIGH (ref 0.44–1.00)
GFR, EST AFRICAN AMERICAN: 26 mL/min — AB (ref >60)
GFR, EST NON AFRICAN AMERICAN: 22 mL/min — AB (ref >60)
Glucose, Bld: 96 mg/dL (ref 65–99)
POTASSIUM: 4.4 mmol/L (ref 3.5–5.1)
SODIUM: 131 mmol/L — AB (ref 135–145)

## 2015-06-14 LAB — GLUCOSE, CAPILLARY
GLUCOSE-CAPILLARY: 81 mg/dL (ref 65–99)
Glucose-Capillary: 111 mg/dL — ABNORMAL HIGH (ref 65–99)
Glucose-Capillary: 115 mg/dL — ABNORMAL HIGH (ref 65–99)
Glucose-Capillary: 86 mg/dL (ref 65–99)
Glucose-Capillary: 87 mg/dL (ref 65–99)

## 2015-06-14 LAB — CBC
HCT: 28 % — ABNORMAL LOW (ref 36.0–46.0)
HEMOGLOBIN: 8.2 g/dL — AB (ref 12.0–15.0)
MCH: 18.2 pg — AB (ref 26.0–34.0)
MCHC: 29.3 g/dL — ABNORMAL LOW (ref 30.0–36.0)
MCV: 62.2 fL — ABNORMAL LOW (ref 78.0–100.0)
PLATELETS: 205 10*3/uL (ref 150–400)
RBC: 4.5 MIL/uL (ref 3.87–5.11)
RDW: 21.5 % — ABNORMAL HIGH (ref 11.5–15.5)
WBC: 15.9 10*3/uL — AB (ref 4.0–10.5)

## 2015-06-14 LAB — CREATININE, URINE, RANDOM: Creatinine, Urine: 127.04 mg/dL

## 2015-06-14 LAB — PROCALCITONIN: PROCALCITONIN: 1.49 ng/mL

## 2015-06-14 LAB — SODIUM, URINE, RANDOM: SODIUM UR: 13 mmol/L

## 2015-06-14 MED ORDER — METHYLPREDNISOLONE SODIUM SUCC 40 MG IJ SOLR
40.0000 mg | Freq: Four times a day (QID) | INTRAMUSCULAR | Status: DC
  Administered 2015-06-14 – 2015-06-15 (×4): 40 mg via INTRAVENOUS
  Filled 2015-06-14 (×8): qty 1

## 2015-06-14 MED ORDER — FENTANYL CITRATE (PF) 100 MCG/2ML IJ SOLN
50.0000 ug | INTRAMUSCULAR | Status: DC | PRN
  Administered 2015-06-14 – 2015-06-15 (×6): 50 ug via INTRAVENOUS
  Filled 2015-06-14 (×6): qty 2

## 2015-06-14 MED ORDER — CIPROFLOXACIN IN D5W 200 MG/100ML IV SOLN
200.0000 mg | Freq: Two times a day (BID) | INTRAVENOUS | Status: AC
  Administered 2015-06-14 – 2015-06-16 (×5): 200 mg via INTRAVENOUS
  Filled 2015-06-14 (×6): qty 100

## 2015-06-14 NOTE — Progress Notes (Addendum)
Wasted 283m of Fentanyl down the sink with SMelinda Crutch RN.  RMaia Petties RN  I agree with the above statement  SMelinda CrutchRN

## 2015-06-14 NOTE — Consult Note (Addendum)
WOC ostomy follow-up consult note CCS following for assessment and plan of care to abd wound. Stoma type/location: LLQ, end colostomy Stomal assessment/size: aprox. 1 3/4" round, slightly above skin level Peristomal assessment: intact skin surrounding but area at 6:00 o'clock on stoma has mod amt thick tan drainage; appearance consistent with possible pus, and is 1 cm deep when swab inserted. Output: approx 50cc dark red liquid drainage, no stool or flatus. Ostomy pouching: Applied 2 piece pouch with barrier ring  Education provided: Current pouch was leaking behind barrier. Demonstrated pouch change using 2 piece pouch and a barrier ring to maintain seal.  Enrolled patient in [place] program: No Ostomy supplies ordered to the room for bedside nurse use. No family in the room and pt is very tired and does not watch the process or ask questions.  Will resume teaching sessions when stable and out of ICU. Dr Redmond Pulling came into the room after new pouch had been applied; notified of appearance of stoma and drainage as described above. Julien Girt MSN, RN, [place], [place], [place]

## 2015-06-14 NOTE — Procedures (Signed)
Extubation Procedure Note  Patient Details:   Name: Dominique [name] DOB: October 14, 1980 MRN: [id number]   Airway Documentation:     Evaluation  O2 sats: stable throughout Complications: No apparent complications Patient did tolerate procedure well. Bilateral Breath Sounds: Clear, Diminished Suctioning: Airway Yes able to voice  Pt extubated at this time per MD order, placed on 4L [place], tolerating well, no distress noted at this time  Ciro Backer 06/14/2015, 9:35 AM

## 2015-06-14 NOTE — Progress Notes (Signed)
Patient ID: Dominique [name], female   DOB: 10/08/1980, 34 y.o.   MRN: [id number]     [facility]      [address] [place], [place] [address]    Phone: [phone] FAX: [phone]     Subjective: Dominique [name] is trending down.  Afebrile.  Extubated this am.  Talking.    Objective:  Vital signs:  Filed Vitals:   06/14/15 0900 06/14/15 0934 06/14/15 1000 06/14/15 1150  BP: 114/53  103/55   Pulse: 80  81   Temp:    98 F (36.7 C)  TempSrc:    Oral  Resp: 16  0   Height:      Weight:      SpO2: 100% 100% 100%     Last BM Date: 06/08/15  Intake/Output   Yesterday:  09/20 0701 - 09/21 0700 In: 4467.9 [I.V.:3587.9; NG/GT:180; IV Piggyback:700] Out: 730 [Urine:480; Emesis/NG output:250] This shift:  Total I/O In: 418.5 [I.V.:288.5; NG/GT:30; IV Piggyback:100] Out: 40 [Urine:40]  Physical Exam: General: Pt awake/alert/oriented x4 in no acute distress  Abdomen: Soft.  Nondistended.  Tender over incision.  Wound is clean, deep, serous fluid output, fascia appears intact when palpated, but pt is so obese and wound is deep its hard to tell.  No evidence of peritonitis.  No incarcerated hernias.    Problem List:   Principal Problem:   Colitis Active Problems:   Morbid obesity   Iron deficiency anemia   Generalized abdominal pain   Protein-calorie malnutrition, severe    Results:   Labs: Results for orders placed or performed during the hospital encounter of 06/03/15 (from the past 48 hour(s))  Glucose, capillary     Status: Abnormal   Collection Time: 06/12/15  5:03 PM  Result Value Ref Range   Glucose-Capillary 119 (H) 65 - 99 mg/dL   Comment 1 Venous Specimen   Glucose, capillary     Status: Abnormal   Collection Time: 06/12/15  8:11 PM  Result Value Ref Range   Glucose-Capillary 110 (H) 65 - 99 mg/dL   Comment 1 Notify RN    Comment 2 Document in Chart   Glucose, capillary     Status: Abnormal   Collection Time:  06/12/15 11:46 PM  Result Value Ref Range   Glucose-Capillary 111 (H) 65 - 99 mg/dL  CBC     Status: Abnormal   Collection Time: 06/13/15  4:10 AM  Result Value Ref Range   WBC 18.2 (H) 4.0 - 10.5 K/uL   RBC 4.88 3.87 - 5.11 MIL/uL   Hemoglobin 8.8 (L) 12.0 - 15.0 g/dL   HCT 31.1 (L) 36.0 - 46.0 %   MCV 63.7 (L) 78.0 - 100.0 fL   MCH 18.0 (L) 26.0 - 34.0 pg   MCHC 28.3 (L) 30.0 - 36.0 g/dL   RDW 21.5 (H) 11.5 - 15.5 %   Platelets 209 150 - 400 K/uL  Basic metabolic panel     Status: Abnormal   Collection Time: 06/13/15  4:10 AM  Result Value Ref Range   Sodium 132 (L) 135 - 145 mmol/L   Potassium 4.2 3.5 - 5.1 mmol/L   Chloride 104 101 - 111 mmol/L   CO2 21 (L) 22 - 32 mmol/L   Glucose, Bld 121 (H) 65 - 99 mg/dL   BUN 21 (H) 6 - 20 mg/dL   Creatinine, Ser 1.87 (H) 0.44 - 1.00 mg/dL   Calcium 6.8 (L) 8.9 - 10.3 mg/dL  GFR calc non Af Amer 34 (L) >60 mL/min   GFR calc Af Amer 39 (L) >60 mL/min    Comment: (NOTE) The eGFR has been calculated using the CKD EPI equation. This calculation has not been validated in all clinical situations. eGFR's persistently <60 mL/min signify possible Chronic Kidney Disease.    Anion gap 7 5 - 15  Glucose, capillary     Status: None   Collection Time: 06/13/15  4:29 AM  Result Value Ref Range   Glucose-Capillary 93 65 - 99 mg/dL   Comment 1 Notify RN    Comment 2 Document in Chart   Glucose, capillary     Status: None   Collection Time: 06/13/15  7:14 AM  Result Value Ref Range   Glucose-Capillary 91 65 - 99 mg/dL   Comment 1 Capillary Specimen    Comment 2 Notify RN   Glucose, capillary     Status: None   Collection Time: 06/13/15 11:03 AM  Result Value Ref Range   Glucose-Capillary 93 65 - 99 mg/dL   Comment 1 Capillary Specimen    Comment 2 Notify RN   MRSA PCR Screening     Status: None   Collection Time: 06/13/15 11:15 AM  Result Value Ref Range   MRSA by PCR NEGATIVE NEGATIVE    Comment:        The GeneXpert MRSA Assay  (FDA approved for NASAL specimens only), is one component of a comprehensive MRSA colonization surveillance program. It is not intended to diagnose MRSA infection nor to guide or monitor treatment for MRSA infections.   Glucose, capillary     Status: Abnormal   Collection Time: 06/13/15  3:35 PM  Result Value Ref Range   Glucose-Capillary 101 (H) 65 - 99 mg/dL   Comment 1 Capillary Specimen    Comment 2 Notify RN   Glucose, capillary     Status: None   Collection Time: 06/13/15  7:14 PM  Result Value Ref Range   Glucose-Capillary 84 65 - 99 mg/dL   Comment 1 Capillary Specimen    Comment 2 Notify RN    Comment 3 Document in Chart   Glucose, capillary     Status: None   Collection Time: 06/13/15 11:34 PM  Result Value Ref Range   Glucose-Capillary 83 65 - 99 mg/dL   Comment 1 Capillary Specimen    Comment 2 Notify RN    Comment 3 Document in Chart   Glucose, capillary     Status: None   Collection Time: 06/14/15  3:39 AM  Result Value Ref Range   Glucose-Capillary 81 65 - 99 mg/dL   Comment 1 Capillary Specimen    Comment 2 Notify RN    Comment 3 Document in Chart   CBC     Status: Abnormal   Collection Time: 06/14/15  4:00 AM  Result Value Ref Range   WBC 15.9 (H) 4.0 - 10.5 K/uL   RBC 4.50 3.87 - 5.11 MIL/uL   Hemoglobin 8.2 (L) 12.0 - 15.0 g/dL   HCT 28.0 (L) 36.0 - 46.0 %   MCV 62.2 (L) 78.0 - 100.0 fL   MCH 18.2 (L) 26.0 - 34.0 pg   MCHC 29.3 (L) 30.0 - 36.0 g/dL   RDW 21.5 (H) 11.5 - 15.5 %   Platelets 205 150 - 400 K/uL  Basic metabolic panel     Status: Abnormal   Collection Time: 06/14/15  4:00 AM  Result Value Ref Range   Sodium 131 (  L) 135 - 145 mmol/L   Potassium 4.4 3.5 - 5.1 mmol/L   Chloride 103 101 - 111 mmol/L   CO2 21 (L) 22 - 32 mmol/L   Glucose, Bld 96 65 - 99 mg/dL   BUN 29 (H) 6 - 20 mg/dL   Creatinine, Ser 2.64 (H) 0.44 - 1.00 mg/dL   Calcium 6.9 (L) 8.9 - 10.3 mg/dL   GFR calc non Af Amer 22 (L) >60 mL/min   GFR calc Af Amer 26 (L)  >60 mL/min    Comment: (NOTE) The eGFR has been calculated using the CKD EPI equation. This calculation has not been validated in all clinical situations. eGFR's persistently <60 mL/min signify possible Chronic Kidney Disease.    Anion gap 7 5 - 15  Glucose, capillary     Status: None   Collection Time: 06/14/15  7:14 AM  Result Value Ref Range   Glucose-Capillary 87 65 - 99 mg/dL  Sodium, urine, random     Status: None   Collection Time: 06/14/15  9:15 AM  Result Value Ref Range   Sodium, Ur 13 mmol/L  Creatinine, urine, random     Status: None   Collection Time: 06/14/15  9:15 AM  Result Value Ref Range   Creatinine, Urine 127.04 mg/dL  Procalcitonin - Baseline     Status: None   Collection Time: 06/14/15  9:37 AM  Result Value Ref Range   Procalcitonin 1.49 ng/mL    Comment:        Interpretation: PCT > 0.5 ng/mL and <= 2 ng/mL: Systemic infection (sepsis) is possible, but other conditions are known to elevate PCT as well. (NOTE)         ICU PCT Algorithm               Non ICU PCT Algorithm    ----------------------------     ------------------------------         PCT < 0.25 ng/mL                 PCT < 0.1 ng/mL     Stopping of antibiotics            Stopping of antibiotics       strongly encouraged.               strongly encouraged.    ----------------------------     ------------------------------       PCT level decrease by               PCT < 0.25 ng/mL       >= 80% from peak PCT       OR PCT 0.25 - 0.5 ng/mL          Stopping of antibiotics                                             encouraged.     Stopping of antibiotics           encouraged.    ----------------------------     ------------------------------       PCT level decrease by              PCT >= 0.25 ng/mL       < 80% from peak PCT        AND PCT >= 0.5 ng/mL  Continuing antibiotics                                              encouraged.       Continuing antibiotics             encouraged.    ----------------------------     ------------------------------     PCT level increase compared          PCT > 0.5 ng/mL         with peak PCT AND          PCT >= 0.5 ng/mL             Escalation of antibiotics                                          strongly encouraged.      Escalation of antibiotics        strongly encouraged.     Imaging / Studies: Dg Chest Port 1 View  06/14/2015   CLINICAL DATA:  Ventilator dependent respiratory failure  EXAM: PORTABLE CHEST - 1 VIEW  COMPARISON:  Portable chest x-ray of 06/13/2015  FINDINGS: The lungs are slightly better aerated. The tip of the endotracheal tube is in now approximately 3.4 cm above the carina. Right IJ central venous line tip overlies the expected SVC -RA junction. No pneumothorax is seen. Cardiomegaly is stable. NG tube extends below the hemidiaphragm.  IMPRESSION: 1. Slightly better aeration. 2. Tip of endotracheal tube 3.4 cm above the carina.   Electronically Signed   By: Ivar Drape M.D.   On: 06/14/2015 08:12   Dg Chest Port 1 View  06/13/2015   CLINICAL DATA:  Respiratory failure.  EXAM: PORTABLE CHEST - 1 VIEW  COMPARISON:  None.  FINDINGS: Endotracheal tube, NG tube, right IJ line in stable position. Cardiomegaly with normal pulmonary vascularity. Low lung volumes. No pleural effusion or pneumothorax.  IMPRESSION: 1. Lines and tubes in stable position. 2. Stable cardiomegaly. 3. Stable low lung volumes.  No focal alveolar infiltrate.   Electronically Signed   By: Marcello Moores  Register   On: 06/13/2015 07:50    Medications / Allergies:  Scheduled Meds: . antiseptic oral rinse  7 mL Mouth Rinse QID  . chlorhexidine gluconate  15 mL Mouth Rinse BID  . ciprofloxacin  400 mg Intravenous Q12H  . heparin subcutaneous  5,000 Units Subcutaneous 3 times per day  . Influenza vac split quadrivalent PF  0.5 mL Intramuscular Tomorrow-1000  . insulin aspart  0-20 Units Subcutaneous 6 times per day  . methylPREDNISolone  (SOLU-MEDROL) injection  40 mg Intravenous Q6H  . metronidazole  500 mg Intravenous Q8H  . pantoprazole (PROTONIX) IV  40 mg Intravenous Daily  . pneumococcal 13-valent conjugate vaccine  0.5 mL Intramuscular Tomorrow-1000   Continuous Infusions: . dexmedetomidine Stopped (06/14/15 0900)  . fentaNYL infusion INTRAVENOUS Stopped (06/14/15 0900)  . lactated ringers 100 mL/hr at 06/14/15 0400  . phenylephrine (NEO-SYNEPHRINE) Adult infusion 10 mcg/min (06/14/15 1000)   PRN Meds:.albuterol, dextrose, fentaNYL, fentaNYL (SUBLIMAZE) injection  Antibiotics: Anti-infectives    Start     Dose/Rate Route Frequency Ordered Stop   06/10/15 1800  ciprofloxacin (CIPRO) IVPB 400 mg     400 mg 200 mL/hr over 60  Minutes Intravenous Every 12 hours 06/10/15 1734     06/09/15 1927  ciprofloxacin (CIPRO) 400 MG/200ML IVPB    Comments:  Ubaldo Glassing   : cabinet override      06/09/15 1927 06/09/15 1953   06/09/15 1830  ciprofloxacin (CIPRO) IVPB 400 mg  Status:  Discontinued     400 mg 200 mL/hr over 60 Minutes Intravenous  Once 06/09/15 1825 06/10/15 1734   06/09/15 1830  metroNIDAZOLE (FLAGYL) IVPB 500 mg     500 mg 100 mL/hr over 60 Minutes Intravenous Every 8 hours 06/09/15 1825     06/04/15 1200  metroNIDAZOLE (FLAGYL) IVPB 500 mg  Status:  Discontinued     500 mg 100 mL/hr over 60 Minutes Intravenous Every 8 hours 06/04/15 1034 06/07/15 1405   06/04/15 0015  cefTRIAXone (ROCEPHIN) 1 g in dextrose 5 % 50 mL IVPB     1 g 100 mL/hr over 30 Minutes Intravenous  Once 06/04/15 0012 06/04/15 0108        Assessment/Plan Perforated descending colon, colitis POD 3,5 s/p ex lap with DRAINAGE OF INTRA-ABDOMINAL ABSCESS, PARTIAL COLON RESECTION, re-exploration and creation of colostomy and abdominal closure -cont BID dressing changes--wound with serous output, monitor for wound dehiscence. -cont abx therapy -WOC consult for ostomy care -clamp NGT -PT eval -pathology shows active and chronic  crohn's  ARF -still with minimal UOP. Defer to CCM VDRF -extubated DVT Prophylaxis -SCDs/heparin  Erby Pian, [facility] Surgery Pager [phone](7A-4:30P) For consults and floor pages call [phone](7A-4:30P)  06/14/2015 12:26 PM

## 2015-06-14 NOTE — Progress Notes (Signed)
PULMONARY  / CRITICAL CARE MEDICINE CONSULTATION   Name: Dominique [name] MRN: [id number] DOB: August 21, 1981    ADMISSION DATE:  06/03/2015 CONSULTATION DATE: 06/09/15  PRIMARY SERVICE: Surgery  CHIEF COMPLAINT: Abdominal pain.  BRIEF PATIENT DESCRIPTION:  34 yo female with hx of colitis presented with severe abdominal pain, diarrhea, and vomiting.  SIGNIFICANT EVENTS: 9/10 Admit 9/11 [facility] consulted 9/14 started steroids for Crohn's colitis 9/16 Free air in abdomen from perforated descending colon >> surgery consulted >> laparotomy, drain of abscess, partial colectomy, wound vac; remained on vent post op 9/18 To OR >> wound closed, colostomy created 9/19 added pressors  STUDIES:  9/12 colonoscopy >> severe chronic inflammatory changes in proximal sigmoid and descending colon >> active chronic colitis with abundant granulomata  SUBJECTIVE: Remains on pressors.  Tolerating SBT >> not much cuff leak.  VITAL SIGNS: Temp:  [97.7 F (36.5 C)-99.2 F (37.3 C)] 98.3 F (36.8 C) (09/21 0723) Pulse Rate:  [77-101] 84 (09/21 0800) Resp:  [14-23] 18 (09/21 0800) BP: (80-119)/(25-67) 111/64 mmHg (09/21 0800) SpO2:  [99 %-100 %] 100 % (09/21 0800) FiO2 (%):  [30 %] 30 % (09/21 0800) HEMODYNAMICS: CVP:  [6 mmHg-11 mmHg] 9 mmHg VENTILATOR SETTINGS: Vent Mode:  [-] PSV FiO2 (%):  [30 %] 30 % Set Rate:  [16 bmp] 16 bmp Vt Set:  [420 mL] 420 mL PEEP:  [5 cmH20] 5 cmH20 Pressure Support:  [5 cmH20-8 cmH20] 5 cmH20 Plateau Pressure:  [21 cmH20] 21 cmH20 INTAKE / OUTPUT: Intake/Output      09/20 0701 - 09/21 0700 09/21 0701 - 09/22 0700   I.V. (mL/kg) 3587.9 (21.5)    NG/GT 180 30   IV Piggyback 700    Total Intake(mL/kg) 4467.9 (26.8) 30 (0.2)   Urine (mL/kg/hr) 480 (0.1) 10 (0)   Emesis/NG output 250 (0.1) 0 (0)   Stool 0 (0)    Total Output 730 10   Net +3737.9 +20          PHYSICAL EXAMINATION: General: Sedated Neuro: RASS 0, open's eyes with stimulation, moves  extremities HEENT:  ETT in place Cardiovascular: regular, no murmur Lungs: no wheeze Abdomen: soft, wound dressing clean, colostomy in place Musculoskeletal: 1+ edema Skin: no rashes  LABS:  CBC  Recent Labs Lab 06/12/15 0341 06/13/15 0410 06/14/15 0400  WBC 18.5* 18.2* 15.9*  HGB 9.1* 8.8* 8.2*  HCT 31.5* 31.1* 28.0*  PLT 240 209 205   Coag's  Recent Labs Lab 06/11/15 2345  INR 1.60*   BMET  Recent Labs Lab 06/12/15 0341 06/13/15 0410 06/14/15 0400  NA 135 132* 131*  K 4.2 4.2 4.4  CL 106 104 103  CO2 24 21* 21*  BUN 12 21* 29*  CREATININE 0.87 1.87* 2.64*  GLUCOSE 100* 121* 96   Electrolytes  Recent Labs Lab 06/11/15 0419 06/11/15 2345 06/12/15 0341 06/13/15 0410 06/14/15 0400  CALCIUM 7.5* 6.8* 6.9* 6.8* 6.9*  MG 1.8 1.8 1.7  --   --   PHOS  --  4.1 4.3  --   --    Sepsis Markers  Recent Labs Lab 06/11/15 2345  LATICACIDVEN 1.6   ABG  Recent Labs Lab 06/10/15 1218 06/11/15 2359  PHART 7.415 7.418  PCO2ART 47.0* 37.7  PO2ART 171.0* 135.0*   Liver Enzymes  Recent Labs Lab 06/08/15 0535 06/09/15 1325 06/11/15 2345  AST 20 44* 34  ALT 13* 17 18  ALKPHOS 79 95 115  BILITOT 0.6 0.6 0.7  ALBUMIN 2.2* 2.1* 1.4*  Cardiac Enzymes  Recent Labs Lab 06/11/15 2345  TROPONINI <0.03   Glucose  Recent Labs Lab 06/13/15 0714 06/13/15 1103 06/13/15 1535 06/13/15 1914 06/13/15 2334 06/14/15 0339  GLUCAP 91 93 101* 84 83 81    Imaging Dg Chest Port 1 View  06/14/2015   CLINICAL DATA:  Ventilator dependent respiratory failure  EXAM: PORTABLE CHEST - 1 VIEW  COMPARISON:  Portable chest x-ray of 06/13/2015  FINDINGS: The lungs are slightly better aerated. The tip of the endotracheal tube is in now approximately 3.4 cm above the carina. Right IJ central venous line tip overlies the expected SVC -RA junction. No pneumothorax is seen. Cardiomegaly is stable. NG tube extends below the hemidiaphragm.  IMPRESSION: 1. Slightly better  aeration. 2. Tip of endotracheal tube 3.4 cm above the carina.   Electronically Signed   By: Ivar Drape M.D.   On: 06/14/2015 08:12   Dg Chest Port 1 View  06/13/2015   CLINICAL DATA:  Respiratory failure.  EXAM: PORTABLE CHEST - 1 VIEW  COMPARISON:  None.  FINDINGS: Endotracheal tube, NG tube, right IJ line in stable position. Cardiomegaly with normal pulmonary vascularity. Low lung volumes. No pleural effusion or pneumothorax.  IMPRESSION: 1. Lines and tubes in stable position. 2. Stable cardiomegaly. 3. Stable low lung volumes.  No focal alveolar infiltrate.   Electronically Signed   By: Marcello Moores  Register   On: 06/13/2015 07:50    ASSESSMENT / PLAN:  PULMONARY ETT 9/16 >> A: Acute respiratory failure in setting of sepsis and peritonitis. Concern for upper airway edema Hx of Asthma, OSA, OHS. P: Add solumedrol for upper airway edema and proceed with extubation trial F/u CXR Albuterol prn  CARDIOVASCULAR Rt IJ CVL 9/16 >> A:  Hypovolemia, sinus tachycardia. P:  Continue IV fluids Goal CVP 8 to 12 Wean off pressors to keep MAP > 65  RENAL A:   Oliguria, AKI >> likely from hypovolemia >> renal fx getting worse. P:   Monitor renal fx, urine outpt Continue IV fluid Check renal u/s, FeNa Might need to get nephrology involved  GASTROINTESTINAL A:   Perforated descending colon with peritonitis s/p partial colectomy. New dx of Crohn's colitis 06/07/15. Morbid obesity. P:   Post-op care, nutrition per [facility] Protonix for SUP Restart solumedrol 9/21  HEMATOLOGIC A:   Anemia of critical illness. P:  F/u CBC SQ heparin for DVT prevention  INFECTIOUS A:   Sepsis from peritonitis. P:   Day 6 of cipro, flagyl Will check procalcitonin and then decide if she can have abx d/c soon  ENDOCRINE A:   Hyperglycemia. P:   SSI   NEUROLOGIC A: Acute encephalopathy 2nd to sepsis and peritonitis. P:   Monitor mental status  SUMMARY: Will proceed with extubation.  Will treat  upper airway edema with solumedrol >> showed help with Crohn's also.  Will further assess status of renal fx.  CC time 35 minutes.  Chesley Mires, MD [facility] Pulmonary/Critical Care 06/14/2015, 8:56 AM Pager:  [phone] After 3pm call: [phone]

## 2015-06-14 NOTE — Progress Notes (Signed)
Attempted to place patient on Bi-PAP tonight and was unable due to the large leak created by the nasogastric tube. Patient's vitals are stable at this time on 2L nasal cannula (HR 88 SpO2 100% RR 18) RT will continue to monitor.

## 2015-06-15 DIAGNOSIS — E662 Morbid (severe) obesity with alveolar hypoventilation: Secondary | ICD-10-CM

## 2015-06-15 LAB — GLUCOSE, CAPILLARY
GLUCOSE-CAPILLARY: 112 mg/dL — AB (ref 65–99)
GLUCOSE-CAPILLARY: 114 mg/dL — AB (ref 65–99)
GLUCOSE-CAPILLARY: 96 mg/dL (ref 65–99)
GLUCOSE-CAPILLARY: 91 mg/dL (ref 65–99)
Glucose-Capillary: 107 mg/dL — ABNORMAL HIGH (ref 65–99)
Glucose-Capillary: 100 mg/dL — ABNORMAL HIGH (ref 65–99)

## 2015-06-15 LAB — RENAL FUNCTION PANEL
ALBUMIN: 1.2 g/dL — AB (ref 3.5–5.0)
ANION GAP: 6 (ref 5–15)
BUN: 37 mg/dL — AB (ref 6–20)
CALCIUM: 7 mg/dL — AB (ref 8.9–10.3)
CO2: 21 mmol/L — ABNORMAL LOW (ref 22–32)
Chloride: 103 mmol/L (ref 101–111)
Creatinine, Ser: 2.84 mg/dL — ABNORMAL HIGH (ref 0.44–1.00)
GFR calc Af Amer: 24 mL/min — ABNORMAL LOW (ref >60)
GFR, EST NON AFRICAN AMERICAN: 20 mL/min — AB (ref >60)
Glucose, Bld: 116 mg/dL — ABNORMAL HIGH (ref 65–99)
PHOSPHORUS: 6.6 mg/dL — AB (ref 2.5–4.6)
POTASSIUM: 4.9 mmol/L (ref 3.5–5.1)
SODIUM: 130 mmol/L — AB (ref 135–145)

## 2015-06-15 LAB — PROCALCITONIN: Procalcitonin: 0.98 ng/mL

## 2015-06-15 LAB — CBC
HEMATOCRIT: 23 % — AB (ref 36.0–46.0)
HEMOGLOBIN: 7.1 g/dL — AB (ref 12.0–15.0)
MCH: 18.4 pg — ABNORMAL LOW (ref 26.0–34.0)
MCHC: 30 g/dL (ref 30.0–36.0)
MCV: 61.5 fL — ABNORMAL LOW (ref 78.0–100.0)
Platelets: 178 10*3/uL (ref 150–400)
RBC: 3.74 MIL/uL — ABNORMAL LOW (ref 3.87–5.11)
RDW: 21.7 % — ABNORMAL HIGH (ref 11.5–15.5)
WBC: 19.4 10*3/uL — AB (ref 4.0–10.5)

## 2015-06-15 LAB — HEMOGLOBIN AND HEMATOCRIT, BLOOD
HCT: 24.7 % — ABNORMAL LOW (ref 36.0–46.0)
Hemoglobin: 7.7 g/dL — ABNORMAL LOW (ref 12.0–15.0)

## 2015-06-15 LAB — ANAEROBIC CULTURE

## 2015-06-15 MED ORDER — ONDANSETRON HCL 4 MG/2ML IJ SOLN
4.0000 mg | Freq: Four times a day (QID) | INTRAMUSCULAR | Status: DC | PRN
  Administered 2015-06-15 – 2015-06-20 (×8): 4 mg via INTRAVENOUS
  Filled 2015-06-15 (×7): qty 2

## 2015-06-15 MED ORDER — FENTANYL CITRATE (PF) 100 MCG/2ML IJ SOLN
50.0000 ug | INTRAMUSCULAR | Status: DC | PRN
  Administered 2015-06-15: 50 ug via INTRAVENOUS
  Administered 2015-06-15 (×2): 100 ug via INTRAVENOUS
  Administered 2015-06-15 – 2015-06-16 (×2): 50 ug via INTRAVENOUS
  Administered 2015-06-16 (×2): 100 ug via INTRAVENOUS
  Administered 2015-06-16: 50 ug via INTRAVENOUS
  Administered 2015-06-16 – 2015-06-18 (×14): 100 ug via INTRAVENOUS
  Administered 2015-06-18: 50 ug via INTRAVENOUS
  Administered 2015-06-18: 100 ug via INTRAVENOUS
  Administered 2015-06-18: 50 ug via INTRAVENOUS
  Administered 2015-06-18 – 2015-06-19 (×3): 100 ug via INTRAVENOUS
  Administered 2015-06-19: 50 ug via INTRAVENOUS
  Administered 2015-06-19 (×4): 100 ug via INTRAVENOUS
  Administered 2015-06-20 – 2015-06-23 (×14): 50 ug via INTRAVENOUS
  Administered 2015-06-23 (×2): 100 ug via INTRAVENOUS
  Administered 2015-06-23 (×4): 50 ug via INTRAVENOUS
  Administered 2015-06-23 – 2015-06-26 (×17): 100 ug via INTRAVENOUS
  Filled 2015-06-15 (×71): qty 2

## 2015-06-15 MED ORDER — METHYLPREDNISOLONE SODIUM SUCC 40 MG IJ SOLR
40.0000 mg | Freq: Two times a day (BID) | INTRAMUSCULAR | Status: DC
  Administered 2015-06-15 – 2015-06-16 (×2): 40 mg via INTRAVENOUS
  Filled 2015-06-15 (×4): qty 1

## 2015-06-15 NOTE — Progress Notes (Signed)
Orthopedic Tech Progress Note Patient Details:  Dominique [name] 1981/03/25 [id number]  Ortho Devices Type of Ortho Device: Abdominal binder Ortho Device/Splint Location: abdomen Ortho Device/Splint Interventions: Loanne Drilling, [place] 06/15/2015, 9:05 AM

## 2015-06-15 NOTE — Progress Notes (Signed)
Rehab Admissions Coordinator Note:  Patient was screened by [name] for appropriateness for an Inpatient Acute Rehab Consult.  At this time, we are recommending Inpatient Rehab consult.  [name] 06/15/2015, 4:17 PM  I can be reached at [phone].

## 2015-06-15 NOTE — Progress Notes (Signed)
PULMONARY  / CRITICAL CARE MEDICINE CONSULTATION   Name: Dominique [name] MRN: [id number] DOB: 1980/10/15    ADMISSION DATE:  06/03/2015 CONSULTATION DATE: 06/09/15  PRIMARY SERVICE: Surgery  CHIEF COMPLAINT: Abdominal pain.  BRIEF PATIENT DESCRIPTION:  34 yo female with hx of colitis presented with severe abdominal pain, diarrhea, and vomiting.  SIGNIFICANT EVENTS: 9/10 Admit 9/11 [facility] consulted 9/14 started steroids for Crohn's colitis 9/16 Free air in abdomen from perforated descending colon >> surgery consulted >> laparotomy, drain of abscess, partial colectomy, wound vac; remained on vent post op 9/18 To OR >> wound closed, colostomy created 9/19 added pressors 9/21 Extubated, off pressors  STUDIES:  9/12 colonoscopy >> severe chronic inflammatory changes in proximal sigmoid and descending colon >> active chronic colitis with abundant granulomata 9/22 renal u/s >> no hydronephrosis  SUBJECTIVE: Feels better.  Still has abdominal discomfort, but not as bad.  Denies chest pain.  VITAL SIGNS: Temp:  [97.9 F (36.6 C)-98.4 F (36.9 C)] 97.9 F (36.6 C) (09/22 0700) Pulse Rate:  [72-91] 91 (09/22 0800) Resp:  [0-23] 16 (09/22 0800) BP: (95-114)/(40-73) 95/53 mmHg (09/22 0800) SpO2:  [99 %-100 %] 99 % (09/22 0800) HEMODYNAMICS: CVP:  [8 mmHg-14 mmHg] 12 mmHg INTAKE / OUTPUT: Intake/Output      09/21 0701 - 09/22 0700 09/22 0701 - 09/23 0700   I.V. (mL/kg) 2258.5 (13.5) 100 (0.6)   NG/GT 90    IV Piggyback 500    Total Intake(mL/kg) 2848.5 (17.1) 100 (0.6)   Urine (mL/kg/hr) 615 (0.2) 50 (0.2)   Emesis/NG output 60 (0)    Stool 50 (0)    Total Output 725 50   Net +2123.5 +50        Urine Occurrence  1 x     PHYSICAL EXAMINATION: General: pleasant Neuro: moves extremities HEENT: NG tube in place Cardiovascular: regular, no murmur Lungs: no wheeze Abdomen: soft, wound dressing clean, colostomy in place Musculoskeletal: 1+ edema Skin: no  rashes  LABS:  CBC  Recent Labs Lab 06/13/15 0410 06/14/15 0400 06/15/15 0430  WBC 18.2* 15.9* 19.4*  HGB 8.8* 8.2* 7.1*  HCT 31.1* 28.0* 23.0*  PLT 209 205 178   Coag's  Recent Labs Lab 06/11/15 2345  INR 1.60*   BMET  Recent Labs Lab 06/13/15 0410 06/14/15 0400 06/15/15 0430  NA 132* 131* 130*  K 4.2 4.4 4.9  CL 104 103 103  CO2 21* 21* 21*  BUN 21* 29* 37*  CREATININE 1.87* 2.64* 2.84*  GLUCOSE 121* 96 116*   Electrolytes  Recent Labs Lab 06/11/15 0419 06/11/15 2345 06/12/15 0341 06/13/15 0410 06/14/15 0400 06/15/15 0430  CALCIUM 7.5* 6.8* 6.9* 6.8* 6.9* 7.0*  MG 1.8 1.8 1.7  --   --   --   PHOS  --  4.1 4.3  --   --  6.6*   Sepsis Markers  Recent Labs Lab 06/11/15 2345 06/14/15 0937  LATICACIDVEN 1.6  --   PROCALCITON  --  1.49   ABG  Recent Labs Lab 06/10/15 1218 06/11/15 2359  PHART 7.415 7.418  PCO2ART 47.0* 37.7  PO2ART 171.0* 135.0*   Liver Enzymes  Recent Labs Lab 06/09/15 1325 06/11/15 2345 06/15/15 0430  AST 44* 34  --   ALT 17 18  --   ALKPHOS 95 115  --   BILITOT 0.6 0.7  --   ALBUMIN 2.1* 1.4* 1.2*   Cardiac Enzymes  Recent Labs Lab 06/11/15 2345  TROPONINI <0.03   Glucose  Recent  Labs Lab 06/14/15 0339 06/14/15 0714 06/14/15 1148 06/14/15 1909 06/14/15 2332 06/15/15 0355  GLUCAP 81 87 86 111* 115* 112*    Imaging US Renal  06/15/2015   CLINICAL DATA:  Renal failure.  EXAM: RENAL / URINARY TRACT ULTRASOUND COMPLETE  COMPARISON:  CT abdomen and pelvis 06/04/2015  FINDINGS: Right Kidney:  Length: 10.4 cm. Echogenicity within normal limits. No mass or hydronephrosis visualized.  Left Kidney:  Length: 12.9 cm. Limited visualization due to overlying bowel gas and rib shadowing. No evidence of hydronephrosis.  Bladder:  Bladder could not be examined due to bandages and open wound.  IMPRESSION: Limited visualization. No evidence of hydronephrosis in the kidneys. Bladder was not evaluated.    Electronically Signed   By: Lucienne Capers M.D.   On: 06/15/2015 02:19   Dg Chest Port 1 View  06/14/2015   CLINICAL DATA:  Ventilator dependent respiratory failure  EXAM: PORTABLE CHEST - 1 VIEW  COMPARISON:  Portable chest x-ray of 06/13/2015  FINDINGS: The lungs are slightly better aerated. The tip of the endotracheal tube is in now approximately 3.4 cm above the carina. Right IJ central venous line tip overlies the expected SVC -RA junction. No pneumothorax is seen. Cardiomegaly is stable. NG tube extends below the hemidiaphragm.  IMPRESSION: 1. Slightly better aeration. 2. Tip of endotracheal tube 3.4 cm above the carina.   Electronically Signed   By: Ivar Drape M.D.   On: 06/14/2015 08:12    ASSESSMENT / PLAN:  PULMONARY ETT 9/16 >> 9/21 A: Acute respiratory failure in setting of sepsis and peritonitis. Concern for upper airway edema >> resolved with steroid trial. Hx of Asthma, OSA, OHS. P: F/u CXR as needed Albuterol prn BiPAP qhs and prn during the day Oxygen to keep SpO2 > 92% Bronchial hygiene  CARDIOVASCULAR Rt IJ CVL 9/16 >> A:  Hypovolemia, sinus tachycardia >> improved. P:  Continue IV fluids Goal CVP 8 to 12  RENAL A:   Oliguria, AKI >> likely from hypovolemia (Urine Na 13) >> likely reaching plateau. P:   Monitor renal fx, urine outpt Continue IV fluid Defer nephrology evaluation for now  GASTROINTESTINAL A:   Perforated descending colon with peritonitis s/p partial colectomy. New dx of Crohn's colitis 06/07/15. Morbid obesity. Nausea. Nutrition. P:   Post-op care, nutrition, NG tube per [facility] Protonix for SUP Restart solumedrol 9/21 >> will need to reconsult GI to determine additional therapy for Crohn's colitis PRN zofran  HEMATOLOGIC A:   Anemia of critical illness. P:  F/u CBC SQ heparin for DVT prevention  INFECTIOUS A:   Sepsis from peritonitis. P:   Day 7 of cipro, flagyl Will check procalcitonin and then decide if she can have abx  d/c soon  ENDOCRINE A:   Hyperglycemia. P:   SSI   NEUROLOGIC A: Acute encephalopathy 2nd to sepsis and peritonitis >> resolved. Post-op pain control. P:   PRN fentanyl  If she remains stable, then likely can transfer out of ICU 9/23.  Chesley Mires, MD [facility] Pulmonary/Critical Care 06/15/2015, 8:16 AM Pager:  [phone] After 3pm call: [phone]

## 2015-06-15 NOTE — Evaluation (Signed)
Physical Therapy Evaluation Patient Details Name: Dominique [name] MRN: [id number] DOB: 11-19-1980 Today's Date: 06/15/2015   History of Present Illness  Adm 9/10 for colitis 9/16 detected perforated descending colon >>laparotomy, drain of abscess, partial colectomy, wound vac; remained on vent post op 9/18 To OR >> wound closed, colostomy created 9/21 Extubated, off pressors    Clinical Impression  Patient is s/p above surgery resulting in functional limitations due to the deficits listed below (see PT Problem List). Patient was functional at home PTA (including climbing 8 steps to leave apartment), however today was limited by anxiety re: pain and fear of falling.Due to prolonged time off her feet and anxiety, anticipate slower progress. Patient will benefit from skilled PT to increase their independence and safety with mobility to allow discharge to the venue listed below.       Follow Up Recommendations CIR (Medicaid; ?other options)    Equipment Recommendations  Rolling walker with 5" wheels;Other (comment) (Bariatric)    Recommendations for Other Services OT consult     Precautions / Restrictions Precautions Precautions: Fall;Other (comment) (colostomy LLQ)      Mobility  Bed Mobility Overal bed mobility: Needs Assistance Bed Mobility: Rolling;Sidelying to Sit;Sit to Supine Rolling: Mod assist (+3) Sidelying to sit: Max assist (+4 using deflated hover mat/handles as a sling to raise tors)   Sit to supine: Max assist (+4)   General bed mobility comments: hover mat initially helpful with scooting laterally and up in bed. pt assisted into near-sidelying on Lt, air mattress deflated as she came up to sit (using deflated hover pad as a sling to raise torso); rolled numerous times Rt and Lt for re-centering linens/hover mat  Transfers Overall transfer level: Needs assistance Equipment used: Rolling walker (2 wheeled) (bariatric) Transfers: Sit to/from Stand           General  transfer comment: attempted numerous times with little to no effort from pt due to fear of pain and falling; unable to even clear buttocks off bed  Ambulation/Gait                Stairs            Wheelchair Mobility    Modified Rankin (Stroke Patients Only)       Balance Overall balance assessment: Needs assistance Sitting-balance support: No upper extremity supported;Feet supported Sitting balance-Leahy Scale: Fair                                       Pertinent Vitals/Pain Pain Assessment: Faces Faces Pain Scale: Hurts even more Pain Location: abd Pain Descriptors / Indicators: Operative site guarding Pain Intervention(s): Limited activity within patient's tolerance;Monitored during session;Premedicated before session;Repositioned    Home Living Family/patient expects to be discharged to:: Private residence Living Arrangements: Children;Parent (70 yo dtr "Fre[employer]") Available Help at Discharge: Family Type of Home: Apartment Home Access: Stairs to enter Entrance Stairs-Rails: Right;Left;Can reach both Technical brewer of Steps: 8 Home Layout: One level Home Equipment: None      Prior Function Level of Independence: Independent         Comments: reports she was weak due to illness last several days at home, but did ascend 8 steps to parking lot and came to hospital via car     Hand Dominance   Dominant Hand: Right    Extremity/Trunk Assessment   Upper Extremity Assessment: Overall WFL for tasks assessed (although  apposition of tissue prevents functional horiz adct)           Lower Extremity Assessment: Generalized weakness;RLE deficits/detail;LLE deficits/detail RLE Deficits / Details: limited knee flexion to ~90 with AAROM; when asked to bend her knees up to roll, she bends knees in "frog position" with full external rotation and requires max assist to internally rotate to roll; unable to stand with +2 assist LLE  Deficits / Details: limited knee flexion to ~90 with AAROM; when asked to bend her knees up to roll, she bends knees in "frog position" with full external rotation and requires max assist to internally rotate to roll; unable to stand with +2 assist  Cervical / Trunk Assessment: Other exceptions  Communication   Communication: No difficulties (very quiet)  Cognition Arousal/Alertness: Awake/alert Behavior During Therapy: Flat affect;Anxious Overall Cognitive Status: Within Functional Limits for tasks assessed                      General Comments      Exercises        Assessment/Plan    PT Assessment Patient needs continued PT services  PT Diagnosis Generalized weakness   PT Problem List Decreased strength;Decreased range of motion;Decreased activity tolerance;Decreased balance;Decreased mobility;Decreased knowledge of use of DME;Obesity;Pain;Decreased skin integrity  PT Treatment Interventions DME instruction;Gait training;Functional mobility training;Stair training;Therapeutic activities;Therapeutic exercise;Patient/family education   PT Goals (Current goals can be found in the Care Plan section) Acute Rehab PT Goals Patient Stated Goal: return home to 39 yo daughter PT Goal Formulation: With patient Time For Goal Achievement: 06/29/15 Potential to Achieve Goals: Good    Frequency Min 3X/week (more if able)   Barriers to discharge        Co-evaluation               End of Session Equipment Utilized During Treatment: Other (comment) (hover mat) Activity Tolerance: Other (comment) (limited by fear) Patient left: in bed;with nursing/sitter in room Nurse Communication: Mobility status;Patient requests pain meds (nursing assisted)         Time: 4680-3212 PT Time Calculation (min) (ACUTE ONLY): 43 min   Charges:   PT Evaluation $Initial PT Evaluation Tier I: 1 Procedure PT Treatments $Therapeutic Activity: 23-37 mins   PT G Codes:         Dominique [name] 11-Jul-2015, 3:15 PM Pager [phone]

## 2015-06-16 DIAGNOSIS — L8990 Pressure ulcer of unspecified site, unspecified stage: Secondary | ICD-10-CM | POA: Diagnosis present

## 2015-06-16 LAB — RENAL FUNCTION PANEL
ALBUMIN: 1.3 g/dL — AB (ref 3.5–5.0)
Anion gap: 6 (ref 5–15)
BUN: 45 mg/dL — AB (ref 6–20)
CHLORIDE: 105 mmol/L (ref 101–111)
CO2: 21 mmol/L — ABNORMAL LOW (ref 22–32)
CREATININE: 2.75 mg/dL — AB (ref 0.44–1.00)
Calcium: 7.2 mg/dL — ABNORMAL LOW (ref 8.9–10.3)
GFR calc Af Amer: 25 mL/min — ABNORMAL LOW (ref >60)
GFR, EST NON AFRICAN AMERICAN: 21 mL/min — AB (ref >60)
GLUCOSE: 99 mg/dL (ref 65–99)
POTASSIUM: 4.7 mmol/L (ref 3.5–5.1)
Phosphorus: 5.9 mg/dL — ABNORMAL HIGH (ref 2.5–4.6)
Sodium: 132 mmol/L — ABNORMAL LOW (ref 135–145)

## 2015-06-16 LAB — CBC
HEMATOCRIT: 22 % — AB (ref 36.0–46.0)
Hemoglobin: 7 g/dL — ABNORMAL LOW (ref 12.0–15.0)
MCH: 19.1 pg — ABNORMAL LOW (ref 26.0–34.0)
MCHC: 31.8 g/dL (ref 30.0–36.0)
MCV: 59.9 fL — AB (ref 78.0–100.0)
PLATELETS: 196 10*3/uL (ref 150–400)
RBC: 3.67 MIL/uL — ABNORMAL LOW (ref 3.87–5.11)
RDW: 21.8 % — AB (ref 11.5–15.5)
WBC: 23.8 10*3/uL — ABNORMAL HIGH (ref 4.0–10.5)

## 2015-06-16 LAB — GLUCOSE, CAPILLARY
GLUCOSE-CAPILLARY: 97 mg/dL (ref 65–99)
GLUCOSE-CAPILLARY: 99 mg/dL (ref 65–99)
Glucose-Capillary: 96 mg/dL (ref 65–99)
Glucose-Capillary: 102 mg/dL — ABNORMAL HIGH (ref 65–99)
Glucose-Capillary: 105 mg/dL — ABNORMAL HIGH (ref 65–99)

## 2015-06-16 LAB — PREALBUMIN: PREALBUMIN: 3.5 mg/dL — AB (ref 18–38)

## 2015-06-16 LAB — PREPARE RBC (CROSSMATCH)

## 2015-06-16 LAB — HEMOGLOBIN AND HEMATOCRIT, BLOOD
HEMATOCRIT: 21.1 % — AB (ref 36.0–46.0)
Hemoglobin: 6.6 g/dL — CL (ref 12.0–15.0)

## 2015-06-16 MED ORDER — METOCLOPRAMIDE HCL 5 MG PO TABS
5.0000 mg | ORAL_TABLET | Freq: Three times a day (TID) | ORAL | Status: DC
  Administered 2015-06-16 – 2015-06-26 (×30): 5 mg via ORAL
  Filled 2015-06-16 (×34): qty 1

## 2015-06-16 MED ORDER — METHYLPREDNISOLONE SODIUM SUCC 40 MG IJ SOLR
40.0000 mg | Freq: Every day | INTRAMUSCULAR | Status: DC
  Administered 2015-06-17 – 2015-06-19 (×3): 40 mg via INTRAVENOUS
  Filled 2015-06-16 (×3): qty 1

## 2015-06-16 MED ORDER — SODIUM CHLORIDE 0.9 % IV SOLN: Freq: Once | INTRAVENOUS | Status: DC

## 2015-06-16 NOTE — Progress Notes (Signed)
PULMONARY  / CRITICAL CARE MEDICINE CONSULTATION   Name: Dominique [name] MRN: [id number] DOB: Jul 21, 1981    ADMISSION DATE:  06/03/2015 CONSULTATION DATE: 06/09/15  PRIMARY SERVICE: Surgery  CHIEF COMPLAINT: Abdominal pain.  BRIEF PATIENT DESCRIPTION:  34 yo female with hx of colitis presented with severe abdominal pain, diarrhea, and vomiting.  SIGNIFICANT EVENTS: 9/10 Admit 9/11 [facility] consulted 9/14 started steroids for Crohn's colitis 9/16 Free air in abdomen from perforated descending colon >> surgery consulted >> laparotomy, drain of abscess, partial colectomy, wound vac; remained on vent post op 9/18 To OR >> wound closed, colostomy created 9/19 added pressors 9/21 Extubated, off pressors  STUDIES:  9/12 colonoscopy >> severe chronic inflammatory changes in proximal sigmoid and descending colon >> active chronic colitis with abundant granulomata 9/22 renal u/s >> no hydronephrosis  SUBJECTIVE: Denies chest pain, dyspnea.  Still feels weak.  VITAL SIGNS: Temp:  [97.6 F (36.4 C)-98 F (36.7 C)] 97.9 F (36.6 C) (09/23 0700) Pulse Rate:  [83-103] 94 (09/23 0700) Resp:  [0-22] 12 (09/23 0700) BP: (82-117)/(48-81) 117/49 mmHg (09/23 0700) SpO2:  [91 %-98 %] 96 % (09/23 0700) HEMODYNAMICS: CVP:  [7 mmHg-26 mmHg] 14 mmHg INTAKE / OUTPUT: Intake/Output      09/22 0701 - 09/23 0700 09/23 0701 - 09/24 0700   I.V. (mL/kg) 2400 (14.4)    NG/GT 30    IV Piggyback 300    Total Intake(mL/kg) 2730 (16.3)    Urine (mL/kg/hr) 905 (0.2)    Emesis/NG output 100 (0)    Stool 75 (0)    Total Output 1080     Net +1650          Urine Occurrence 2 x    Stool Occurrence 1 x    Emesis Occurrence 1 x      PHYSICAL EXAMINATION: General: pleasant Neuro: moves extremities HEENT: NG tube in place Cardiovascular: regular, no murmur Lungs: no wheeze Abdomen: soft, wound dressing clean, colostomy in place Musculoskeletal: 1+ edema Skin: no rashes  LABS:  CBC  Recent  Labs Lab 06/14/15 0400 06/15/15 0430 06/15/15 1450 06/16/15 0350  WBC 15.9* 19.4*  --  23.8*  HGB 8.2* 7.1* 7.7* 7.0*  HCT 28.0* 23.0* 24.7* 22.0*  PLT 205 178  --  196   Coag's  Recent Labs Lab 06/11/15 2345  INR 1.60*   BMET  Recent Labs Lab 06/14/15 0400 06/15/15 0430 06/16/15 0350  NA 131* 130* 132*  K 4.4 4.9 4.7  CL 103 103 105  CO2 21* 21* 21*  BUN 29* 37* 45*  CREATININE 2.64* 2.84* 2.75*  GLUCOSE 96 116* 99   Electrolytes  Recent Labs Lab 06/11/15 0419  06/11/15 2345 06/12/15 0341  06/14/15 0400 06/15/15 0430 06/16/15 0350  CALCIUM 7.5*  --  6.8* 6.9*  < > 6.9* 7.0* 7.2*  MG 1.8  --  1.8 1.7  --   --   --   --   PHOS  --   < > 4.1 4.3  --   --  6.6* 5.9*  < > = values in this interval not displayed.   Sepsis Markers  Recent Labs Lab 06/11/15 2345 06/14/15 0937 06/15/15 0843  LATICACIDVEN 1.6  --   --   PROCALCITON  --  1.49 0.98   ABG  Recent Labs Lab 06/10/15 1218 06/11/15 2359  PHART 7.415 7.418  PCO2ART 47.0* 37.7  PO2ART 171.0* 135.0*   Liver Enzymes  Recent Labs Lab 06/09/15 1325 06/11/15 2345 06/15/15 0430 06/16/15  0350  AST 44* 34  --   --   ALT 17 18  --   --   ALKPHOS 95 115  --   --   BILITOT 0.6 0.7  --   --   ALBUMIN 2.1* 1.4* 1.2* 1.3*   Cardiac Enzymes  Recent Labs Lab 06/11/15 2345  TROPONINI <0.03   Glucose  Recent Labs Lab 06/15/15 0747 06/15/15 1159 06/15/15 1626 06/15/15 1921 06/15/15 2324 06/16/15 0355  GLUCAP 114* 107* 96 91 100* 96    Imaging US Renal  06/15/2015   CLINICAL DATA:  Renal failure.  EXAM: RENAL / URINARY TRACT ULTRASOUND COMPLETE  COMPARISON:  CT abdomen and pelvis 06/04/2015  FINDINGS: Right Kidney:  Length: 10.4 cm. Echogenicity within normal limits. No mass or hydronephrosis visualized.  Left Kidney:  Length: 12.9 cm. Limited visualization due to overlying bowel gas and rib shadowing. No evidence of hydronephrosis.  Bladder:  Bladder could not be examined due to  bandages and open wound.  IMPRESSION: Limited visualization. No evidence of hydronephrosis in the kidneys. Bladder was not evaluated.   Electronically Signed   By: Lucienne Capers M.D.   On: 06/15/2015 02:19    ASSESSMENT / PLAN:  PULMONARY ETT 9/16 >> 9/21 A: Acute respiratory failure in setting of sepsis and peritonitis. Concern for upper airway edema after extubation >> resolved with steroid trial. Hx of Asthma, OSA, OHS. P: F/u CXR as needed Albuterol prn BiPAP qhs Oxygen to keep SpO2 > 92% Bronchial hygiene  CARDIOVASCULAR Rt IJ CVL 9/16 >> A:  Hypovolemia, sinus tachycardia >> improved. P:  Continue IV fluids  RENAL A:   Oliguria, AKI >> likely from hypovolemia (Urine Na 13) >> creatinine trending down. P:   Monitor renal fx, urine outpt Continue IV fluid  GASTROINTESTINAL A:   Perforated descending colon with peritonitis s/p partial colectomy. New dx of Crohn's colitis 06/07/15. Morbid obesity. Nausea. Nutrition. P:   Post-op care, nutrition per [facility] Protonix for SUP Restarted solumedrol 9/21 >> will need to reconsult GI to determine additional therapy for Crohn's colitis PRN zofran  HEMATOLOGIC A:   Anemia of critical illness. P:  F/u CBC and transfuse if Hb < 7 SQ heparin for DVT prevention  INFECTIOUS A:   Sepsis from peritonitis >> procalcitonin trending down P:   D/c Abx after doses on 9/23  ENDOCRINE A:   Hyperglycemia. P:   SSI   NEUROLOGIC A: Acute encephalopathy 2nd to sepsis and peritonitis >> resolved. Post-op pain control. Deconditioning. P:   PRN fentanyl PT recommending CIR if she has insurance coverage  Transfer to SDU 9/23.  Will ask [facility] to assume care 9/24 and PCCM off.  Chesley Mires, MD [facility] Pulmonary/Critical Care 06/16/2015, 8:48 AM Pager:  [phone] After 3pm call: [phone]

## 2015-06-16 NOTE — Consult Note (Signed)
Physical Medicine and Rehabilitation Consult Reason for Consult: Debilitation after perforated colon/respiratory failure Referring Physician: Critical care   HPI: Dominique [name] is a 34 y.o. right handed morbidly obese female admitted 06/04/2015 with noted history of asthma and recent recurrent colitis since January 2016. Patient lives with family independent prior to admission. Presented with severe abdominal pain with occasional vomiting and nausea. Placed on intravenous Cipro and Flagyl. Underwent sigmoidoscopy 06/05/2015 showing severe chronic inflammatory changes in the proximal sigmoid and descending colon. Biopsies diagnosed with Crohn disease. CT angiogram and imaging revealed free intraperitoneal air/perforated viscus likely perforated left colon from Crohn's colitis. Underwent exploratory laparotomy drainage of intra-abdominal abscess partial colon resection application of wound VAC 06/09/2015 per Dr. Grandville Silos followed by reexploration of abdomen with closure creation of colostomy 06/11/2015.. Follow-up wound care nurse for ostomy care. Hospital course respiratory failure required intubation followed by critical care medicine. She was extubated 06/14/2015. Hospital course acute blood loss anemia 7.0 and monitored. Acute renal insufficiency creatinine 2.64-2.84. Renal ultrasound showing no hydronephrosis. Subcutaneous heparin for DVT prophylaxis. Patient currently remains nothing by mouth. Physical therapy evaluation completed 06/15/2015 with recommendations of physical medicine rehabilitation consult.   Review of Systems  Constitutional: Negative for fever and chills.  HENT: Negative for hearing loss.   Respiratory: Positive for shortness of breath.   Cardiovascular: Negative for chest pain, palpitations and leg swelling.  Gastrointestinal: Positive for nausea, vomiting, abdominal pain and diarrhea.  Genitourinary: Negative for dysuria and hematuria.  Skin: Negative for rash.    Neurological: Negative for dizziness, seizures, loss of consciousness and headaches.   Past Medical History  Diagnosis Date  . Asthma   . Sleep apnea   . Anemia   . Morbid obesity 03/22/2009    Qualifier: Diagnosis of  By: Ronnald Ramp CMA, [place]    . OSA (obstructive sleep apnea) 05/02/2014  . Thyromegaly 05/02/2014  . Seasonal allergies 06/13/2014  . Acute acalculous cholecystitis 11/16/2014  . Diverticulitis of colon 11/17/2014  . Hepatic steatosis 11/17/2014   Past Surgical History  Procedure Laterality Date  . C section 2011  2011  . Flexible sigmoidoscopy N/A 06/05/2015    Procedure: FLEXIBLE SIGMOIDOSCOPY;  Surgeon: Ronald Lobo, MD;  Location: [facility];  Service: Endoscopy;  Laterality: N/A;  . Laparotomy N/A 06/09/2015    Procedure: EXPLORATORY LAPAROTOMY, DRAINAGE OF INTRAABDOMINAL ABSCESS, PARTIAL COLON RESECTION,  APPLICATION OF WOUND VAC;  Surgeon: Georganna Skeans, MD;  Location: [place];  Service: General;  Laterality: N/A;  . Laparotomy N/A 06/11/2015    Procedure: RE-EXPLORATION OF ABDOMEN;  Surgeon: Georganna Skeans, MD;  Location: [place] [facility];  Service: General;  Laterality: N/A;  . Colostomy N/A 06/11/2015    Procedure:  CREATION OF COLOSTOMY;  Surgeon: Georganna Skeans, MD;  Location: [facility] OR;  Service: General;  Laterality: N/A;   Family History  Problem Relation Age of Onset  . Hypertension Mother   . Hypertension Father   . Hypertension Sister   . Asthma Brother   . Diabetes Maternal Grandmother   . Hypertension Maternal Grandmother   . Hypertension Maternal Grandfather   . Cancer Paternal Grandmother     lung  . Emphysema Paternal Grandfather   . Allergies Mother    Social History:  reports that she has quit smoking. Her smoking use included Cigarettes. She started smoking about 10 months ago. She has a 2 pack-year smoking history. She has never used smokeless tobacco. She reports that she does not drink alcohol or use illicit drugs. Allergies:  Allergies  Allergen  Reactions  . Other Shortness Of Breath and Swelling    Tree nuts  . Penicillins     Unknown childhood allergy   Medications Prior to Admission  Medication Sig Dispense Refill  . ferrous sulfate 325 (65 FE) MG tablet Take 2 tablets (650 mg total) by mouth daily with breakfast. 60 tablet 3  . ondansetron (ZOFRAN) 4 MG tablet Take 1 tablet (4 mg total) by mouth every 6 (six) hours. 12 tablet 0  . [DISCONTINUED] ciprofloxacin (CIPRO) 500 MG tablet Take 1 tablet (500 mg total) by mouth 2 (two) times daily. One po bid x 7 days 14 tablet 0  . [DISCONTINUED] HYDROcodone-acetaminophen (NORCO/VICODIN) 5-325 MG per tablet TAKE 1 TABLET BY MOUTH EVERY 6 HOURS AS NEEDED FOR PAIN  0  . [DISCONTINUED] metroNIDAZOLE (FLAGYL) 500 MG tablet Take 1 tablet (500 mg total) by mouth 3 (three) times daily. One po tid x 7 days 21 tablet 0  . albuterol (PROVENTIL) (2.5 MG/3ML) 0.083% nebulizer solution Take 3 mLs (2.5 mg total) by nebulization every 6 (six) hours as needed for wheezing or shortness of breath. (Patient not taking: Reported on 06/03/2015) 360 mL 0  . PROAIR HFA 108 (90 BASE) MCG/ACT inhaler INHALE 2 PUFFS BY MOUTH INTO THE LUNGS EVERY 6 HOURS AS NEEDED FOR WHEEZING OR SHORTNESS OF BREATH 8.5 g 3  . [DISCONTINUED] beclomethasone (QVAR) 80 MCG/ACT inhaler Inhale 2 puffs into the lungs 2 (two) times daily. (Patient not taking: Reported on 06/03/2015) 1 Inhaler 12  . [DISCONTINUED] cyclobenzaprine (FLEXERIL) 10 MG tablet Take 1 tablet (10 mg total) by mouth at bedtime. (Patient not taking: Reported on 05/25/2015) 30 tablet 1  . [DISCONTINUED] pantoprazole (PROTONIX) 40 MG tablet Take 1 tablet (40 mg total) by mouth daily. (Patient not taking: Reported on 05/20/2015) 30 tablet 1  . [DISCONTINUED] ranitidine (ZANTAC) 150 MG capsule Take 1 capsule (150 mg total) by mouth 2 (two) times daily. (Patient not taking: Reported on 05/20/2015) 60 capsule 0  . [DISCONTINUED] torsemide (DEMADEX) 20 MG tablet Take 1 tablet (20 mg  total) by mouth daily. (Patient not taking: Reported on 06/03/2015) 30 tablet 3    Home: Home Living Family/patient expects to be discharged to:: Private residence Living Arrangements: Children, Parent (69 yo dtr "Freedom") Available Help at Discharge: Family Type of Home: Apartment Home Access: Stairs to enter Technical brewer of Steps: 8 Entrance Stairs-Rails: Right, Left, Can reach both Home Layout: One level Bathroom Shower/Tub: Chiropodist: Standard Home Equipment: None  Functional History: Prior Function Level of Independence: Independent Comments: reports she was weak due to illness last several days at home, but did ascend 8 steps to parking lot and came to hospital via car Functional Status:  Mobility: Bed Mobility Overal bed mobility: Needs Assistance Bed Mobility: Rolling, Sidelying to Sit, Sit to Supine Rolling: Mod assist (+3) Sidelying to sit: Max assist (+4 using deflated hover mat/handles as a sling to raise tors) Sit to supine: Max assist (+4) General bed mobility comments: hover mat initially helpful with scooting laterally and up in bed. pt assisted into near-sidelying on Lt, air mattress deflated as she came up to sit (using deflated hover pad as a sling to raise torso); rolled numerous times Rt and Lt for re-centering linens/hover mat Transfers Overall transfer level: Needs assistance Equipment used: Rolling walker (2 wheeled) (bariatric) Transfers: Sit to/from Stand General transfer comment: attempted numerous times with little to no effort from pt due to fear of pain and  falling; unable to even clear buttocks off bed      ADL:    Cognition: Cognition Overall Cognitive Status: Within Functional Limits for tasks assessed Orientation Level: Oriented X4 Cognition Arousal/Alertness: Awake/alert Behavior During Therapy: Flat affect, Anxious Overall Cognitive Status: Within Functional Limits for tasks assessed  Blood pressure  118/59, pulse 96, temperature 97.9 F (36.6 C), temperature source Oral, resp. rate 17, height 5' 2"  (1.575 m), weight 167.009 kg (368 lb 3 oz), last menstrual period 05/06/2015, SpO2 95 %. Physical Exam  Constitutional: She is oriented to person, place, and time.  34 year old African-American morbidly obese female  HENT:  Head: Normocephalic.  Eyes: EOM are normal.  Neck: Normal range of motion. Neck supple. No thyromegaly present.  Cardiovascular: Normal rate and regular rhythm.   Respiratory: Effort normal and breath sounds normal. No respiratory distress.  GI: Soft.  New abdominal dressing applied  Neurological: She is alert and oriented to person, place, and time.    Results for orders placed or performed during the hospital encounter of 06/03/15 (from the past 24 hour(s))  Glucose, capillary     Status: Abnormal   Collection Time: 06/15/15 11:59 AM  Result Value Ref Range   Glucose-Capillary 107 (H) 65 - 99 mg/dL   Comment 1 Capillary Specimen    Comment 2 Notify RN   Hemoglobin and hematocrit, blood     Status: Abnormal   Collection Time: 06/15/15  2:50 PM  Result Value Ref Range   Hemoglobin 7.7 (L) 12.0 - 15.0 g/dL   HCT 24.7 (L) 36.0 - 46.0 %  Glucose, capillary     Status: None   Collection Time: 06/15/15  4:26 PM  Result Value Ref Range   Glucose-Capillary 96 65 - 99 mg/dL  Glucose, capillary     Status: None   Collection Time: 06/15/15  7:21 PM  Result Value Ref Range   Glucose-Capillary 91 65 - 99 mg/dL   Comment 1 Capillary Specimen    Comment 2 Notify RN    Comment 3 Document in Chart   Glucose, capillary     Status: Abnormal   Collection Time: 06/15/15 11:24 PM  Result Value Ref Range   Glucose-Capillary 100 (H) 65 - 99 mg/dL   Comment 1 Capillary Specimen    Comment 2 Notify RN    Comment 3 Document in Chart   Prealbumin     Status: Abnormal   Collection Time: 06/16/15  3:50 AM  Result Value Ref Range   Prealbumin 3.5 (L) 18 - 38 mg/dL  Renal  function panel     Status: Abnormal   Collection Time: 06/16/15  3:50 AM  Result Value Ref Range   Sodium 132 (L) 135 - 145 mmol/L   Potassium 4.7 3.5 - 5.1 mmol/L   Chloride 105 101 - 111 mmol/L   CO2 21 (L) 22 - 32 mmol/L   Glucose, Bld 99 65 - 99 mg/dL   BUN 45 (H) 6 - 20 mg/dL   Creatinine, Ser 2.75 (H) 0.44 - 1.00 mg/dL   Calcium 7.2 (L) 8.9 - 10.3 mg/dL   Phosphorus 5.9 (H) 2.5 - 4.6 mg/dL   Albumin 1.3 (L) 3.5 - 5.0 g/dL   GFR calc non Af Amer 21 (L) >60 mL/min   GFR calc Af Amer 25 (L) >60 mL/min   Anion gap 6 5 - 15  CBC     Status: Abnormal   Collection Time: 06/16/15  3:50 AM  Result Value Ref Range  WBC 23.8 (H) 4.0 - 10.5 K/uL   RBC 3.67 (L) 3.87 - 5.11 MIL/uL   Hemoglobin 7.0 (L) 12.0 - 15.0 g/dL   HCT 22.0 (L) 36.0 - 46.0 %   MCV 59.9 (L) 78.0 - 100.0 fL   MCH 19.1 (L) 26.0 - 34.0 pg   MCHC 31.8 30.0 - 36.0 g/dL   RDW 21.8 (H) 11.5 - 15.5 %   Platelets 196 150 - 400 K/uL  Glucose, capillary     Status: None   Collection Time: 06/16/15  3:55 AM  Result Value Ref Range   Glucose-Capillary 96 65 - 99 mg/dL   Comment 1 Venous Specimen    Comment 2 Notify RN    Comment 3 Document in Chart   Glucose, capillary     Status: None   Collection Time: 06/16/15  7:46 AM  Result Value Ref Range   Glucose-Capillary 97 65 - 99 mg/dL   US Renal  06/15/2015   CLINICAL DATA:  Renal failure.  EXAM: RENAL / URINARY TRACT ULTRASOUND COMPLETE  COMPARISON:  CT abdomen and pelvis 06/04/2015  FINDINGS: Right Kidney:  Length: 10.4 cm. Echogenicity within normal limits. No mass or hydronephrosis visualized.  Left Kidney:  Length: 12.9 cm. Limited visualization due to overlying bowel gas and rib shadowing. No evidence of hydronephrosis.  Bladder:  Bladder could not be examined due to bandages and open wound.  IMPRESSION: Limited visualization. No evidence of hydronephrosis in the kidneys. Bladder was not evaluated.   Electronically Signed   By: Lucienne Capers M.D.   On: 06/15/2015 02:19     Assessment/Plan: Diagnosis: debility after ex lap, ventilator dependent respiratory failure 1. Does the need for close, 24 hr/day medical supervision in concert with the patient's rehab needs make it unreasonable for this patient to be served in a less intensive setting? Yes 2. Co-Morbidities requiring supervision/potential complications: morbid obesity, wound care 3. Due to bladder management, bowel management, safety, skin/wound care, disease management, medication administration, pain management and patient education, does the patient require 24 hr/day rehab nursing? Yes 4. Does the patient require coordinated care of a physician, rehab nurse, PT (1-2 hrs/day, 5 days/week) and OT (1-2 hrs/day, 5 days/week) to address physical and functional deficits in the context of the above medical diagnosis(es)? Yes Addressing deficits in the following areas: balance, endurance, locomotion, strength, transferring, bowel/bladder control, bathing, dressing, feeding, grooming, toileting and psychosocial support 5. Can the patient actively participate in an intensive therapy program of at least 3 hrs of therapy per day at least 5 days per week? Yes 6. The potential for patient to make measurable gains while on inpatient rehab is excellent 7. Anticipated functional outcomes upon discharge from inpatient rehab are modified independent  with PT, modified independent and supervision with OT, n/a with SLP. 8. Estimated rehab length of stay to reach the above functional goals is: 10-15 days 9. Does the patient have adequate social supports and living environment to accommodate these discharge functional goals? Yes 10. Anticipated D/C setting: Home 11. Anticipated post D/C treatments: [facility] therapy 12. Overall Rehab/Functional Prognosis: excellent  RECOMMENDATIONS: This patient's condition is appropriate for continued rehabilitative care in the following setting: CIR Patient has agreed to participate in recommended  program. Potentially Note that insurance prior authorization may be required for reimbursement for recommended care.  Comment: Rehab Admissions Coordinator to follow up.  Thanks,  [name], MD, Mellody Drown     06/16/2015

## 2015-06-16 NOTE — Progress Notes (Signed)
[facility] Progress Note Patient Name: Dominique [name] DOB: 01-24-1981 MRN: [id number]   Date of Service  06/16/2015  HPI/Events of Note  Hb 6.6  eICU Interventions  1 U PRBC     Intervention Category Intermediate Interventions: Bleeding - evaluation and treatment with blood products  ALVA,RAKESH V. 06/16/2015, 7:24 PM

## 2015-06-16 NOTE — Progress Notes (Signed)
Patient is refusing Bipap tonight. RN made aware, vitals are stable and patient does not appear in any respiratory distress. RT will continue to monitor.

## 2015-06-16 NOTE — Progress Notes (Signed)
[facility] Progress Note  Subjective: We were asked to follow-up on this patient who is now postop from Crohn's disease. She was recently diagnosed with Crohn's disease of the colon. She underwent a flexible sigmoidoscopy which confirmed this. She had a perforation of her colon from the Crohn's disease and on September 16 underwent surgery which was an exploratory laparotomy with drainage of an intra-abdominal abscess and a partial colon resection. The pathology of this colonresection was compatible with Crohn's disease and it showed multiple granulomas.. On September 18 she had another re-exploration with formation of a colostomy on the left side of her abdomen. She now has an open wound on her abdomen which is going to be allowed to heal. She was placed on steroids postop. In reviewing the operative note and after discussion and with Dr. Grandville Silos the bowel was examined and there was no further sign of Crohn's disease. It appears that the active disease area in the left colon was resected and there is no further evidence of disease. We are asked to see her now in regards to postoperative management of Crohn's disease.  Objective: Vital signs in last 24 hours: Temp:  [97.6 F (36.4 C)-98.4 F (36.9 C)] 98.4 F (36.9 C) (09/23 1100) Pulse Rate:  [84-103] 98 (09/23 1200) Resp:  [0-22] 16 (09/23 1200) BP: (82-118)/(48-81) 117/51 mmHg (09/23 1200) SpO2:  [91 %-98 %] 95 % (09/23 1200) Weight change:    PE:  She is alert and oriented and in no acute distress  Heart regular rhythm  Lungs clear  Abdomen, she has a binder on palpation through the binder did not produce any significant pain. I did not view the wound for the colostomy.  Lab Results: Results for orders placed or performed during the hospital encounter of 06/03/15 (from the past 24 hour(s))  Hemoglobin and hematocrit, blood     Status: Abnormal   Collection Time: 06/15/15  2:50 PM  Result Value Ref Range   Hemoglobin  7.7 (L) 12.0 - 15.0 g/dL   HCT 24.7 (L) 36.0 - 46.0 %  Glucose, capillary     Status: None   Collection Time: 06/15/15  4:26 PM  Result Value Ref Range   Glucose-Capillary 96 65 - 99 mg/dL  Glucose, capillary     Status: None   Collection Time: 06/15/15  7:21 PM  Result Value Ref Range   Glucose-Capillary 91 65 - 99 mg/dL   Comment 1 Capillary Specimen    Comment 2 Notify RN    Comment 3 Document in Chart   Glucose, capillary     Status: Abnormal   Collection Time: 06/15/15 11:24 PM  Result Value Ref Range   Glucose-Capillary 100 (H) 65 - 99 mg/dL   Comment 1 Capillary Specimen    Comment 2 Notify RN    Comment 3 Document in Chart   Prealbumin     Status: Abnormal   Collection Time: 06/16/15  3:50 AM  Result Value Ref Range   Prealbumin 3.5 (L) 18 - 38 mg/dL  Renal function panel     Status: Abnormal   Collection Time: 06/16/15  3:50 AM  Result Value Ref Range   Sodium 132 (L) 135 - 145 mmol/L   Potassium 4.7 3.5 - 5.1 mmol/L   Chloride 105 101 - 111 mmol/L   CO2 21 (L) 22 - 32 mmol/L   Glucose, Bld 99 65 - 99 mg/dL   BUN 45 (H) 6 - 20 mg/dL   Creatinine, Ser 2.75 (H) 0.44 -  1.00 mg/dL   Calcium 7.2 (L) 8.9 - 10.3 mg/dL   Phosphorus 5.9 (H) 2.5 - 4.6 mg/dL   Albumin 1.3 (L) 3.5 - 5.0 g/dL   GFR calc non Af Amer 21 (L) >60 mL/min   GFR calc Af Amer 25 (L) >60 mL/min   Anion gap 6 5 - 15  CBC     Status: Abnormal   Collection Time: 06/16/15  3:50 AM  Result Value Ref Range   WBC 23.8 (H) 4.0 - 10.5 K/uL   RBC 3.67 (L) 3.87 - 5.11 MIL/uL   Hemoglobin 7.0 (L) 12.0 - 15.0 g/dL   HCT 22.0 (L) 36.0 - 46.0 %   MCV 59.9 (L) 78.0 - 100.0 fL   MCH 19.1 (L) 26.0 - 34.0 pg   MCHC 31.8 30.0 - 36.0 g/dL   RDW 21.8 (H) 11.5 - 15.5 %   Platelets 196 150 - 400 K/uL  Glucose, capillary     Status: None   Collection Time: 06/16/15  3:55 AM  Result Value Ref Range   Glucose-Capillary 96 65 - 99 mg/dL   Comment 1 Venous Specimen    Comment 2 Notify RN    Comment 3 Document in  Chart   Glucose, capillary     Status: None   Collection Time: 06/16/15  7:46 AM  Result Value Ref Range   Glucose-Capillary 97 65 - 99 mg/dL    Studies/Results: No results found.    Assessment: Crohn's disease of the left colon. Status post perforation. Status post resection of the affected area. Creation of a colostomy. There does not appear to be any evidence of active disease at this time.  Plan:   The challenge in this patient going forward, in addition to her postoperative recovery and management will be trying to prevent recurrence of Crohn's disease activity. At the present time there does not appear to be any active disease as the active disease area in the colon was resected. She has not eaten anything yet but will be started on liquids.  In order to try and prevent recurrence of active Crohn's disease I would recommend the following ONCE SHE IS ABLE TO TAKE ORAL NUTRITION. Begin mesalamine 4 g daily and remain on this chronically. In addition to this treat for 90 days with Flagyl 250 mg by mouth 3 times a day. In addition to this I would obtain aTPMT enzyme activity level with the anticipation of beginning azathioprine 2 mg/kg daily. I would not begin the azathioprine however until we are assured by theTPMT metabolite activity that she can metabolize this medication.  There is no role for using steroids for maintenance of Crohn's disease. If she does not need to be on steroids for some other reason at this time I would recommend tapering or discontinuing them. She was not on these medications prior to admission to the hospital. Steroids also can inhibit wound healing.    Dominique [name] 06/16/2015, 1:23 PM  Pager: [phone] If no answer or after 5 PM call [phone]

## 2015-06-16 NOTE — Progress Notes (Signed)
Physical Therapy Treatment Patient Details Name: Dominique [name] MRN: [id number] DOB: 13-Feb-1981 Today's Date: 06/16/2015    History of Present Illness Adm 9/10 for colitis 9/16 detected perforated descending colon >>laparotomy, drain of abscess, partial colectomy, wound vac; remained on vent post op 9/18 To OR >> wound closed, colostomy created 9/21 Extubated, off pressors    PT Comments    Patient tolerated instruction and completion of bed level exercises well. Unfortunately, despite pre-planning a time to work on mobility with therapy and nursing assisting, staff members were not available at planned time due to other patient needs. Unable to attempt mobility with +1 assist and therefore focused on exercises.  (Note: while PT was instructing in exercises, PT was rearranging lines/equipment in preparation for mobility and it was not until all this was prepared that it became clear neither nursing or rehab tech were available to assist).    Follow Up Recommendations  CIR     Equipment Recommendations  Rolling walker with 5" wheels;Other (comment) (Bariatric)    Recommendations for Other Services OT consult     Precautions / Restrictions Precautions Precautions: Fall;Other (comment) (colostomy LLQ) Required Braces or Orthoses: Other Brace/Splint Other Brace/Splint: abd binder when up Restrictions Weight Bearing Restrictions: No    Mobility  Bed Mobility               General bed mobility comments: Unable to attempt as could not coordinate enough staff to help despite pre-arranging time for PT, rehab tech, and 2 nursing staff.  Transfers                 General transfer comment: Unable to attempt as could not coordinate enough staff to help despite pre-arranging time for PT, rehab tech, and 2 nursing staff.  Ambulation/Gait                 Stairs            Wheelchair Mobility    Modified Rankin (Stroke Patients Only)       Balance                                    Cognition Arousal/Alertness: Awake/alert Behavior During Therapy: Flat affect Overall Cognitive Status: Within Functional Limits for tasks assessed                      Exercises General Exercises - Upper Extremity Shoulder Flexion: AROM;Both;5 reps;Supine Elbow Flexion: AROM;Both;15 reps;Supine Digit Composite Flexion: AROM;Both;15 reps Composite Extension: AROM;Both;15 reps General Exercises - Lower Extremity Ankle Circles/Pumps: AROM;Both;15 reps Heel Slides: AROM;Both;10 reps Other Exercises Other Exercises: Shoulder elevation, bil, 10 reps Other Exercises: Patient educated to continue AROM all joints throughout the day and rationale    General Comments        Pertinent Vitals/Pain Pain Assessment: 0-10 Pain Score: 4  Pain Location: abd Pain Descriptors / Indicators: Operative site guarding Pain Intervention(s): Limited activity within patient's tolerance;Monitored during session    Home Living                      Prior Function            PT Goals (current goals can now be found in the care plan section) Acute Rehab PT Goals Patient Stated Goal: return home to 88 yo daughter PT Goal Formulation: With patient Time For Goal Achievement: 06/29/15 Potential to Achieve Goals:  Good Progress towards PT goals: Progressing toward goals (exercise goal)    Frequency  Min 3X/week (more if able)    PT Plan Other (comment) (difficult to assess)    Co-evaluation             End of Session   Activity Tolerance: Patient tolerated treatment well (exercise only due to staffing issue) Patient left: in bed;with call bell/phone within reach;with SCD's reapplied     Time: 8185-9093 PT Time Calculation (min) (ACUTE ONLY): 27 min  Charges:  $Therapeutic Exercise: 23-37 mins                    G Codes:      SASSER,LYNN Jul 10, 2015, 11:26 AM Pager [phone]

## 2015-06-16 NOTE — Progress Notes (Signed)
Received call from Dustin Folks, RN, regarding critical lab value hemoglobin 6.6 and notified eMD Dr. Elsworth Soho

## 2015-06-16 NOTE — Progress Notes (Signed)
[facility] Progress Note  5 Days Post-Op  Subjective: Pt expresses her pain is unchanged from yesterday. She rates it at a 7/10 and says it does decrease for 30-60 min with administration of pain medication. She expresses hot/cold spells last night, as well as 3 episodes of brown emesis last night. She said she is "starting to feel depressed". IS about 900 today. Last dressing change was at 0300.   Objective: Vital signs in last 24 hours: Temp:  [97.6 F (36.4 C)-98 F (36.7 C)] 97.9 F (36.6 C) (09/23 0700) Pulse Rate:  [84-103] 94 (09/23 0800) Resp:  [0-22] 0 (09/23 0800) BP: (82-117)/(48-81) 110/55 mmHg (09/23 0800) SpO2:  [91 %-98 %] 95 % (09/23 0800) Last BM Date: 06/08/15  Intake/Output from previous day: 09/22 0701 - 09/23 0700 In: 2730 [I.V.:2400; NG/GT:30; IV Piggyback:300] Out: 1080 [Urine:905; Emesis/NG output:100; Stool:75] Intake/Output this shift:   PE: Gen:  Alert, pleasant, mild distress Card:  RRR, no M/G/R heard Pulm:  CTA, no W/R/R Abd: Soft, NT/ND, hypoactive BS, no HSM, midline incision C/D/I with dressing in place, ostomy dusky with small amount of gas and liquid brown stool. Ext:  No erythema, edema, or tenderness   Lab Results:   Recent Labs  06/15/15 0430 06/15/15 1450 06/16/15 0350  WBC 19.4*  --  23.8*  HGB 7.1* 7.7* 7.0*  HCT 23.0* 24.7* 22.0*  PLT 178  --  196   BMET  Recent Labs  06/15/15 0430 06/16/15 0350  NA 130* 132*  K 4.9 4.7  CL 103 105  CO2 21* 21*  GLUCOSE 116* 99  BUN 37* 45*  CREATININE 2.84* 2.75*  CALCIUM 7.0* 7.2*   PT/INR No results for input(s): LABPROT, INR in the last 72 hours. CMP     Component Value Date/Time   NA 132* 06/16/2015 0350   K 4.7 06/16/2015 0350   CL 105 06/16/2015 0350   CO2 21* 06/16/2015 0350   GLUCOSE 99 06/16/2015 0350   BUN 45* 06/16/2015 0350   CREATININE 2.75* 06/16/2015 0350   CREATININE 0.52 12/29/2014 1557   CALCIUM 7.2* 06/16/2015 0350   PROT 4.9*  06/11/2015 2345   ALBUMIN 1.3* 06/16/2015 0350   AST 34 06/11/2015 2345   ALT 18 06/11/2015 2345   ALKPHOS 115 06/11/2015 2345   BILITOT 0.7 06/11/2015 2345   GFRNONAA 21* 06/16/2015 0350   GFRAA 25* 06/16/2015 0350   Lipase     Component Value Date/Time   LIPASE 10* 06/09/2015 1325    Studies/Results: US Renal  06/15/2015   CLINICAL DATA:  Renal failure.  EXAM: RENAL / URINARY TRACT ULTRASOUND COMPLETE  COMPARISON:  CT abdomen and pelvis 06/04/2015  FINDINGS: Right Kidney:  Length: 10.4 cm. Echogenicity within normal limits. No mass or hydronephrosis visualized.  Left Kidney:  Length: 12.9 cm. Limited visualization due to overlying bowel gas and rib shadowing. No evidence of hydronephrosis.  Bladder:  Bladder could not be examined due to bandages and open wound.  IMPRESSION: Limited visualization. No evidence of hydronephrosis in the kidneys. Bladder was not evaluated.   Electronically Signed   By: Lucienne Capers M.D.   On: 06/15/2015 02:19    Anti-infectives: Anti-infectives    Start     Dose/Rate Route Frequency Ordered Stop   06/14/15 1800  ciprofloxacin (CIPRO) IVPB 200 mg     200 mg 100 mL/hr over 60 Minutes Intravenous Every 12 hours 06/14/15 1438 06/17/15 0559   06/10/15 1800  ciprofloxacin (CIPRO) IVPB 400 mg  Status:  Discontinued     400 mg 200 mL/hr over 60 Minutes Intravenous Every 12 hours 06/10/15 1734 06/14/15 1438   06/09/15 1927  ciprofloxacin (CIPRO) 400 MG/200ML IVPB    Comments:  Ubaldo Glassing   : cabinet override      06/09/15 1927 06/09/15 1953   06/09/15 1830  ciprofloxacin (CIPRO) IVPB 400 mg  Status:  Discontinued     400 mg 200 mL/hr over 60 Minutes Intravenous  Once 06/09/15 1825 06/10/15 1734   06/09/15 1830  metroNIDAZOLE (FLAGYL) IVPB 500 mg     500 mg 100 mL/hr over 60 Minutes Intravenous Every 8 hours 06/09/15 1825 06/17/15 0229   06/04/15 1200  metroNIDAZOLE (FLAGYL) IVPB 500 mg  Status:  Discontinued     500 mg 100 mL/hr over 60 Minutes  Intravenous Every 8 hours 06/04/15 1034 06/07/15 1405   06/04/15 0015  cefTRIAXone (ROCEPHIN) 1 g in dextrose 5 % 50 mL IVPB     1 g 100 mL/hr over 30 Minutes Intravenous  Once 06/04/15 0012 06/04/15 0108       Assessment/Plan Perforated descending colon, colitis POD 5,7 s/p ex lap with DRAINAGE OF INTRA-ABDOMINAL ABSCESS, PARTIAL COLON RESECTION, re-exploration and creation of colostomy and abdominal closure - prealbumin 3.5, advance to clears, add Reglan - IVF  - WBC increased from 19.4 to 23.8 this AM. Continue abx therapy. Consider re-culturing. - mobilize and IS, PT on board and inpatient rehab ordered. - DVT prophylaxis with SCD's/heparin - GI prophylaxis with pantoprazole - CIR is interested in the patient AKI - SCr 2.75, GFR 25 - urine OP: 905 mL/24 h  VDRF  -extubated, O2sats 95% ORA  Sepsis -Cipro (9/16 - current), Flagyl (9/11 - current) -trend WBC  Morbid Obesity Hyperglycemia Asthma   LOS: 12 days    Dominique [name] 06/16/2015, 9:53 AM Pager: [phone]

## 2015-06-17 DIAGNOSIS — K650 Generalized (acute) peritonitis: Secondary | ICD-10-CM | POA: Diagnosis present

## 2015-06-17 DIAGNOSIS — K651 Peritoneal abscess: Secondary | ICD-10-CM | POA: Diagnosis present

## 2015-06-17 DIAGNOSIS — G4733 Obstructive sleep apnea (adult) (pediatric): Secondary | ICD-10-CM | POA: Diagnosis present

## 2015-06-17 DIAGNOSIS — K631 Perforation of intestine (nontraumatic): Secondary | ICD-10-CM | POA: Diagnosis present

## 2015-06-17 DIAGNOSIS — J9600 Acute respiratory failure, unspecified whether with hypoxia or hypercapnia: Secondary | ICD-10-CM | POA: Diagnosis present

## 2015-06-17 DIAGNOSIS — Z9989 Dependence on other enabling machines and devices: Secondary | ICD-10-CM

## 2015-06-17 DIAGNOSIS — IMO0001 Reserved for inherently not codable concepts without codable children: Secondary | ICD-10-CM | POA: Insufficient documentation

## 2015-06-17 DIAGNOSIS — N289 Disorder of kidney and ureter, unspecified: Secondary | ICD-10-CM

## 2015-06-17 DIAGNOSIS — K50918 Crohn's disease, unspecified, with other complication: Secondary | ICD-10-CM

## 2015-06-17 DIAGNOSIS — G9340 Encephalopathy, unspecified: Secondary | ICD-10-CM | POA: Diagnosis present

## 2015-06-17 DIAGNOSIS — K50118 Crohn's disease of large intestine with other complication: Secondary | ICD-10-CM | POA: Diagnosis present

## 2015-06-17 DIAGNOSIS — E662 Morbid (severe) obesity with alveolar hypoventilation: Secondary | ICD-10-CM | POA: Diagnosis present

## 2015-06-17 LAB — GLUCOSE, CAPILLARY
GLUCOSE-CAPILLARY: 101 mg/dL — AB (ref 65–99)
GLUCOSE-CAPILLARY: 105 mg/dL — AB (ref 65–99)
GLUCOSE-CAPILLARY: 115 mg/dL — AB (ref 65–99)
GLUCOSE-CAPILLARY: 89 mg/dL (ref 65–99)
GLUCOSE-CAPILLARY: 96 mg/dL (ref 65–99)
Glucose-Capillary: 107 mg/dL — ABNORMAL HIGH (ref 65–99)

## 2015-06-17 LAB — BASIC METABOLIC PANEL
ANION GAP: 9 (ref 5–15)
BUN: 49 mg/dL — ABNORMAL HIGH (ref 6–20)
CHLORIDE: 105 mmol/L (ref 101–111)
CO2: 21 mmol/L — AB (ref 22–32)
Calcium: 7.5 mg/dL — ABNORMAL LOW (ref 8.9–10.3)
Creatinine, Ser: 2.6 mg/dL — ABNORMAL HIGH (ref 0.44–1.00)
GFR calc non Af Amer: 23 mL/min — ABNORMAL LOW (ref >60)
GFR, EST AFRICAN AMERICAN: 26 mL/min — AB (ref >60)
Glucose, Bld: 98 mg/dL (ref 65–99)
Potassium: 4.4 mmol/L (ref 3.5–5.1)
SODIUM: 135 mmol/L (ref 135–145)

## 2015-06-17 LAB — PHOSPHORUS: Phosphorus: 4.7 mg/dL — ABNORMAL HIGH (ref 2.5–4.6)

## 2015-06-17 LAB — CBC
HEMATOCRIT: 23.9 % — AB (ref 36.0–46.0)
HEMOGLOBIN: 7.8 g/dL — AB (ref 12.0–15.0)
MCH: 20.3 pg — ABNORMAL LOW (ref 26.0–34.0)
MCHC: 32.6 g/dL (ref 30.0–36.0)
MCV: 62.2 fL — ABNORMAL LOW (ref 78.0–100.0)
Platelets: 241 10*3/uL (ref 150–400)
RBC: 3.84 MIL/uL — AB (ref 3.87–5.11)
RDW: 24.7 % — ABNORMAL HIGH (ref 11.5–15.5)
WBC: 24.5 10*3/uL — AB (ref 4.0–10.5)

## 2015-06-17 LAB — MAGNESIUM: MAGNESIUM: 1.8 mg/dL (ref 1.7–2.4)

## 2015-06-17 LAB — TSH: TSH: 0.688 u[IU]/mL (ref 0.350–4.500)

## 2015-06-17 LAB — TROPONIN I
TROPONIN I: 0.03 ng/mL (ref <0.031)
Troponin I: 0.09 ng/mL — ABNORMAL HIGH (ref <0.031)

## 2015-06-17 MED ORDER — SACCHAROMYCES BOULARDII 250 MG PO CAPS
250.0000 mg | ORAL_CAPSULE | Freq: Two times a day (BID) | ORAL | Status: DC
  Filled 2015-06-17 (×3): qty 1

## 2015-06-17 MED ORDER — SODIUM CHLORIDE 0.9 % IV BOLUS (SEPSIS)
1000.0000 mL | Freq: Once | INTRAVENOUS | Status: AC
  Administered 2015-06-17: 1000 mL via INTRAVENOUS

## 2015-06-17 MED ORDER — SODIUM CHLORIDE 0.9 % IV SOLN
25.0000 mg | Freq: Once | INTRAVENOUS | Status: DC | PRN
  Administered 2015-06-17: 25 mg via INTRAVENOUS
  Filled 2015-06-17 (×3): qty 1

## 2015-06-17 MED ORDER — SODIUM CHLORIDE 0.9 % IV SOLN
INTRAVENOUS | Status: DC
  Administered 2015-06-18 – 2015-06-19 (×2): via INTRAVENOUS

## 2015-06-17 MED ORDER — MESALAMINE ER 250 MG PO CPCR
1000.0000 mg | ORAL_CAPSULE | Freq: Four times a day (QID) | ORAL | Status: DC
  Administered 2015-06-17 – 2015-06-19 (×9): 1000 mg via ORAL
  Filled 2015-06-17 (×11): qty 4

## 2015-06-17 NOTE — Progress Notes (Signed)
TEAM 1 - Stepdown/ICU TEAM Progress Note  Dominique [name] GQQ:[id number] DOB: 10-Nov-1980 DOA: 06/03/2015 PCP: Benito Mccreedy, MD  Admit HPI / Brief Narrative: 34 year old BF PMHx Asthma, OSA/OHS, Hepatic Steatosis and Recurrent Colitis since jan 2016  Came to the ED with cc of severe abdominal pain. She said the pain started back in Jan and improved only between Feb and May. Since June she continued to have abdominal pain with diarrhea and occasional vomiting. She has not been able to see GI for insurance and referral issues until last week. She said after discussing her last CT abdomen findings with GI, the plan for her was to continue a course of abx and come back but last Friday she started having severe abdominal pain that got more diffuse ( usually she has lower abdominal pain) associated with vomiting daily for 2-3 times ( non bilious and non bloody) and daily diarrhea ( soft stool with occasional blood0. She denied fever or chills. No other complaints. No chest pain , cough or dyspnea  HPI/Subjective: 9/24 A/O 4, negative CP, negative SOB, positive N/V. States has only been able to keep a small amount of juice down. Currently positive nausea. Patient states nausea/vomiting prevents her from using CPAP/BIPAP    Assessment/Plan: Acute /recurrent colitis:  -Patient S/P sigmoidoscopy with new diagnosis of Crohn's disease  Crohn's disease -Per biopsies from 9/12 flexible sigmoidoscopy consistent with Crohn's disease. -Continue Solu-Medrol 40 mg daily -Continue mesalamine 1000 mg QID -Continue metronidazole 500 mg TID  Sepsis unspecified organism/Perforated left colon/Peritonitis/Peritoneal abscess -9/16 patient diagnosed with perforated left colon from Crohn's colitis -Emergent exploratory laparoscopy -See Crohn's disease -Continue normal saline 190m/hr -Strict I&O -Watch for fluid overload -Echocardiogram pending -Out of bed to chair per shift  Asthma:  -Albuterol  nebulizer PRN -Stable  OSA/OHS -CPAP/BIPAP per respiratory -If patient declined secondary to refractory nausea please use Thorazine  Acute respiratory failure with hypoxia/VDRF -Resolved  Anemia of critical illness  -Monitor closely  Acute renal failure -Continue hydration and monitor closely  Acute encephalopathy -Resolved    Code Status: FULL Family Communication: family present at time of exam Disposition Plan: SNF vs CIR    Consultants: Dr.Robert Buccini ([facility]) Dr.Burke TGrandville Silos(Trauma surgery) [facility]   Procedure/Significant Events: 9/12 FLEXIBLE SIGMOIDOSCOPY ;Severe chronic inflammatory changes in the proximal sigmoid and descending colon 9/16 Emergent exploratory laparoscopy; perforated descending colon, colitis descending/sigmoid colon 9/18 Exploratory Laparoscopy, reexploration of abdomen with closure and mobilization of splenic flexure; creation of colostomy   Culture Urine 9/10 >> negative C diff 9/13 >> negative Peritoneal fluid 9/16 >>   Antibiotics: Ceftriaxone x1 >> 9/10 Flagyl 9/11 >> 9/13 Flagyl 9/16 >>  Cipro 9/16 >>   - Per notes has been on cipro + flagyl as an outpt as well   DVT prophylaxis: Heparin   Devices    LINES / TUBES:  ETT 06/09/15 Art Line, 06/09/15 R IJ CVC 06/09/15 NGT 06/09/15 Foley 06/09/15     Continuous Infusions: . sodium chloride 100 mL/hr at 06/17/15 1200    Objective: VITAL SIGNS: Temp: 97.6 F (36.4 C) (09/24 1617) Temp Source: Oral (09/24 1617) BP: 113/76 mmHg (09/24 1800) Pulse Rate: 115 (09/24 1900) SPO2; FIO2:   Intake/Output Summary (Last 24 hours) at 06/17/15 1947 Last data filed at 06/17/15 1900  Gross per 24 hour  Intake 4603.33 ml  Output   1905 ml  Net 2698.33 ml     Exam: General: A/O 4, morbidly obese, No acute respiratory  distress Eyes: Negative headache, negative scleral hemorrhage ENT: Negative Runny nose, negative ear pain, negative gingival  bleeding, Neck:  Negative scars, masses, torticollis, lymphadenopathy, JVD Lungs: Clear to auscultation bilaterally without wheezes or crackles Cardiovascular: Regular rate and rhythm without murmur gallop or rub normal S1 and S2 Abdomen: Positive Abdominal pain (appropriate for recent surgery), morbidly obese, positive soft, bowel sounds, midline vertical incision covered and clean, good granulation tissue, negative sign of infection, ostomy bag in place LLQ negative sign of blood, negative leakage. no rebound, no ascites, no appreciable mass Extremities: No significant cyanosis, clubbing, or edema bilateral lower extremities Psychiatric:  Negative depression, negative anxiety, negative fatigue, negative mania Neurologic:  Cranial nerves II through XII intact, tongue/uvula midline, all extremities muscle strength 5/5, sensation intact throughout, negative dysarthria, negative expressive aphasia, negative receptive aphasia.   Data Reviewed: Basic Metabolic Panel:  Recent Labs Lab 06/11/15 0419 06/11/15 2345 06/12/15 0341 06/13/15 0410 06/14/15 0400 06/15/15 0430 06/16/15 0350 06/17/15 0500 06/17/15 0814  NA 136 132* 135 132* 131* 130* 132* 135  --   K 3.9 4.1 4.2 4.2 4.4 4.9 4.7 4.4  --   CL 104 101 106 104 103 103 105 105  --   CO2 27 24 24  21* 21* 21* 21* 21*  --   GLUCOSE 91 97 100* 121* 96 116* 99 98  --   BUN 7 11 12  21* 29* 37* 45* 49*  --   CREATININE 0.55 0.85 0.87 1.87* 2.64* 2.84* 2.75* 2.60*  --   CALCIUM 7.5* 6.8* 6.9* 6.8* 6.9* 7.0* 7.2* 7.5*  --   MG 1.8 1.8 1.7  --   --   --   --   --  1.8  PHOS  --  4.1 4.3  --   --  6.6* 5.9*  --  4.7*   Liver Function Tests:  Recent Labs Lab 06/11/15 2345 06/15/15 0430 06/16/15 0350  AST 34  --   --   ALT 18  --   --   ALKPHOS 115  --   --   BILITOT 0.7  --   --   PROT 4.9*  --   --   ALBUMIN 1.4* 1.2* 1.3*   No results for input(s): LIPASE, AMYLASE in the last 168 hours. No results for input(s): AMMONIA in the last  168 hours. CBC:  Recent Labs Lab 06/13/15 0410 06/14/15 0400 06/15/15 0430 06/15/15 1450 06/16/15 0350 06/16/15 1814 06/17/15 0500  WBC 18.2* 15.9* 19.4*  --  23.8*  --  24.5*  HGB 8.8* 8.2* 7.1* 7.7* 7.0* 6.6* 7.8*  HCT 31.1* 28.0* 23.0* 24.7* 22.0* 21.1* 23.9*  MCV 63.7* 62.2* 61.5*  --  59.9*  --  62.2*  PLT 209 205 178  --  196  --  241   Cardiac Enzymes:  Recent Labs Lab 06/11/15 2345 06/17/15 0814 06/17/15 1508  TROPONINI <0.03 <0.03 0.03   BNP (last 3 results) No results for input(s): BNP in the last 8760 hours.  ProBNP (last 3 results) No results for input(s): PROBNP in the last 8760 hours.  CBG:  Recent Labs Lab 06/17/15 0024 06/17/15 0344 06/17/15 0737 06/17/15 1239 06/17/15 1615  GLUCAP 101* 96 89 107* 105*    Recent Results (from the past 240 hour(s))  Anaerobic culture     Status: None   Collection Time: 06/09/15  8:33 PM  Result Value Ref Range Status   Specimen Description FLUID PERITONEAL  Final   Special Requests CIPRO AND FLAGYL  Final   Gram Stain   Final    FEW WBC PRESENT,BOTH PMN AND MONONUCLEAR NO ORGANISMS SEEN    Culture NO ANAEROBES ISOLATED  Final   Report Status 06/15/2015 FINAL  Final  Body fluid culture     Status: None   Collection Time: 06/09/15  8:33 PM  Result Value Ref Range Status   Specimen Description FLUID PERITONEAL  Final   Special Requests CIPRO AND FLAGYL  Final   Gram Stain   Final    FEW WBC PRESENT,BOTH PMN AND MONONUCLEAR NO ORGANISMS SEEN    Culture NO GROWTH 3 DAYS  Final   Report Status 06/13/2015 FINAL  Final  MRSA PCR Screening     Status: None   Collection Time: 06/13/15 11:15 AM  Result Value Ref Range Status   MRSA by PCR NEGATIVE NEGATIVE Final    Comment:        The GeneXpert MRSA Assay (FDA approved for NASAL specimens only), is one component of a comprehensive MRSA colonization surveillance program. It is not intended to diagnose MRSA infection nor to guide or monitor treatment  for MRSA infections.      Studies:  Recent x-ray studies have been reviewed in detail by the Attending Physician  Scheduled Meds:  Scheduled Meds: . sodium chloride   Intravenous Once  . antiseptic oral rinse  7 mL Mouth Rinse QID  . chlorhexidine gluconate  15 mL Mouth Rinse BID  . heparin subcutaneous  5,000 Units Subcutaneous 3 times per day  . Influenza vac split quadrivalent PF  0.5 mL Intramuscular Tomorrow-1000  . insulin aspart  0-20 Units Subcutaneous 6 times per day  . mesalamine  1,000 mg Oral QID  . methylPREDNISolone (SOLU-MEDROL) injection  40 mg Intravenous Daily  . metoCLOPramide  5 mg Oral TID AC  . pantoprazole (PROTONIX) IV  40 mg Intravenous Daily  . pneumococcal 13-valent conjugate vaccine  0.5 mL Intramuscular Tomorrow-1000  . saccharomyces boulardii  250 mg Oral BID    Time spent on care of this patient: 40 mins   WOODS, Geraldo Docker , MD  [facility] Office  [phone] Pager - [phone]  On-Call/Text Page:      [email or web address]      password [email or web address]  If 7PM-7AM, please contact night-coverage [email or web address] Password [email or web address] 06/17/2015, 7:47 PM   LOS: 13 days   Care during the described time interval was provided by me .  I have reviewed this patient's available data, including medical history, events of note, physical examination, and all test results as part of my evaluation. I have personally reviewed and interpreted all radiology studies.   Dia Crawford, MD [phone] Pager

## 2015-06-17 NOTE — Progress Notes (Signed)
Subjective: Continues with nausea and vomiting. Having trouble tolerating clear liquids.  Objective: Vital signs in last 24 hours: Temp:  [97.2 F (36.2 C)-98.5 F (36.9 C)] 97.2 F (36.2 C) (09/24 1242) Pulse Rate:  [98-141] 110 (09/24 1200) Resp:  [0-24] 17 (09/24 1200) BP: (117-137)/(53-84) 125/69 mmHg (09/24 1200) SpO2:  [90 %-97 %] 95 % (09/24 1200) Weight change:  Last BM Date: 06/08/15  PE: GEN:  Morbidly obese, bedbound  Lab Results: CBC    Component Value Date/Time   WBC 24.5* 06/17/2015 0500   RBC 3.84* 06/17/2015 0500   RBC 4.95 06/07/2015 0940   HGB 7.8* 06/17/2015 0500   HCT 23.9* 06/17/2015 0500   PLT 241 06/17/2015 0500   MCV 62.2* 06/17/2015 0500   MCH 20.3* 06/17/2015 0500   MCHC 32.6 06/17/2015 0500   RDW 24.7* 06/17/2015 0500   LYMPHSABS 0.6* 06/09/2015 1325   MONOABS 0.6 06/09/2015 1325   EOSABS 0.0 06/09/2015 1325   BASOSABS 0.0 06/09/2015 1325   CMP     Component Value Date/Time   NA 135 06/17/2015 0500   K 4.4 06/17/2015 0500   CL 105 06/17/2015 0500   CO2 21* 06/17/2015 0500   GLUCOSE 98 06/17/2015 0500   BUN 49* 06/17/2015 0500   CREATININE 2.60* 06/17/2015 0500   CREATININE 0.52 12/29/2014 1557   CALCIUM 7.5* 06/17/2015 0500   PROT 4.9* 06/11/2015 2345   ALBUMIN 1.3* 06/16/2015 0350   AST 34 06/11/2015 2345   ALT 18 06/11/2015 2345   ALKPHOS 115 06/11/2015 2345   BILITOT 0.7 06/11/2015 2345   GFRNONAA 23* 06/17/2015 0500   GFRAA 26* 06/17/2015 0500   Assessment:  1.  Left colonic Crohn's colitis, perforated, with colostomy.  No active Crohn's noted operatively upon re-look after her initial resection. 2.  Nausea and vomiting. 3.  Morbid obesity.  Plan:  1.  There is no need for steroids in this patient from a Crohn's disease perspective.  Please taper or discontinue as appropriate, unless there is another compelling reason for her to be on steroids.  From a purely Crohn's disease perspective, the risks and adverse effects  of steroids in this particular patient and this particular scenario far outweigh the benefits. 2.  Probiotics may help, and have few side effects; would start Florastor 250 mg po bid, continue now and upon discharge. 3.  Stop metronidazole, as this medication may be contributing to her nausea and vomiting, and the benefits in Crohn's are modest. 4.  Start Pentasa 500 mg po QID (benefits are also modest, but not less so helpful than metronidazole, and without the multiple side effects), continue now and upon discharge. 5.  Would not even consider azathioprine or biologic therapy until the outpatient setting.  Main side effect of azathioprine, incidentally, is nausea and vomiting. 6.  Dietary advancement per surgical team. 7.  There little else for GI to offer at the present time; patient should follow-up with Dr. Penelope Coop or Dr. Cristina Gong [facility] [phone]) as an outpatient. 8.  Will sign-off; please call with questions.   Landry Dyke 06/17/2015, 1:14 PM   Pager [phone] If no answer or after 5 PM call [phone]

## 2015-06-17 NOTE — Progress Notes (Signed)
Pt transported to 3S Room 2 with no complications.

## 2015-06-17 NOTE — Progress Notes (Signed)
RT Note: Pt has refused bipap at this time due to nausea.  Rt will continue to monitor.

## 2015-06-17 NOTE — Progress Notes (Addendum)
6 Days Post-Op  Subjective: Pt with some n/v with clears yesterday Con't with Dressing changes  Objective: Vital signs in last 24 hours: Temp:  [97.4 F (36.3 C)-98.5 F (36.9 C)] 97.4 F (36.3 C) (09/24 0739) Pulse Rate:  [94-109] 108 (09/24 0600) Resp:  [0-21] 18 (09/24 0600) BP: (108-137)/(50-81) 119/75 mmHg (09/24 0600) SpO2:  [94 %-97 %] 97 % (09/24 0600) Last BM Date: 06/08/15  Intake/Output from previous day: 09/23 0701 - 09/24 0700 In: 3240 [I.V.:2375; DXAJO:878; IV Piggyback:200] Out: 1875 [Urine:1325; Emesis/NG output:350; Stool:200] Intake/Output this shift:    General appearance: alert and cooperative GI: soft, non-tender; bowel sounds normal; no masses,  no organomegaly and ostomy patent, midline wound c/d/i  Lab Results:   Recent Labs  06/16/15 0350 06/16/15 1814 06/17/15 0500  WBC 23.8*  --  24.5*  HGB 7.0* 6.6* 7.8*  HCT 22.0* 21.1* 23.9*  PLT 196  --  241   BMET  Recent Labs  06/16/15 0350 06/17/15 0500  NA 132* 135  K 4.7 4.4  CL 105 105  CO2 21* 21*  GLUCOSE 99 98  BUN 45* 49*  CREATININE 2.75* 2.60*  CALCIUM 7.2* 7.5*   PT/INR No results for input(s): LABPROT, INR in the last 72 hours. ABG No results for input(s): PHART, HCO3 in the last 72 hours.  Invalid input(s): PCO2, PO2  Studies/Results: No results found.  Anti-infectives: Anti-infectives    Start     Dose/Rate Route Frequency Ordered Stop   06/14/15 1800  ciprofloxacin (CIPRO) IVPB 200 mg     200 mg 100 mL/hr over 60 Minutes Intravenous Every 12 hours 06/14/15 1438 06/16/15 1834   06/10/15 1800  ciprofloxacin (CIPRO) IVPB 400 mg  Status:  Discontinued     400 mg 200 mL/hr over 60 Minutes Intravenous Every 12 hours 06/10/15 1734 06/14/15 1438   06/09/15 1927  ciprofloxacin (CIPRO) 400 MG/200ML IVPB    Comments:  Ubaldo Glassing   : cabinet override      06/09/15 1927 06/09/15 1953   06/09/15 1830  ciprofloxacin (CIPRO) IVPB 400 mg  Status:  Discontinued     400  mg 200 mL/hr over 60 Minutes Intravenous  Once 06/09/15 1825 06/10/15 1734   06/09/15 1830  metroNIDAZOLE (FLAGYL) IVPB 500 mg     500 mg 100 mL/hr over 60 Minutes Intravenous Every 8 hours 06/09/15 1825 06/16/15 1930   06/04/15 1200  metroNIDAZOLE (FLAGYL) IVPB 500 mg  Status:  Discontinued     500 mg 100 mL/hr over 60 Minutes Intravenous Every 8 hours 06/04/15 1034 06/07/15 1405   06/04/15 0015  cefTRIAXone (ROCEPHIN) 1 g in dextrose 5 % 50 mL IVPB     1 g 100 mL/hr over 30 Minutes Intravenous  Once 06/04/15 0012 06/04/15 0108      Assessment/Plan: s/p Procedure(s): RE-EXPLORATION OF ABDOMEN (N/A)  CREATION OF COLOSTOMY (N/A) con't CLD for now Con't BID dressing changes  Abx Elev WBC likely secondary to Steroids.  No fevers. SDU when avail   LOS: 13 days    Rosario Jacks., [facility] 06/17/2015

## 2015-06-18 ENCOUNTER — Inpatient Hospital Stay (HOSPITAL_BASED_OUTPATIENT_CLINIC_OR_DEPARTMENT_OTHER): Payer: Medicaid Other

## 2015-06-18 DIAGNOSIS — I5022 Chronic systolic (congestive) heart failure: Secondary | ICD-10-CM

## 2015-06-18 DIAGNOSIS — I471 Supraventricular tachycardia: Secondary | ICD-10-CM

## 2015-06-18 DIAGNOSIS — K5090 Crohn's disease, unspecified, without complications: Secondary | ICD-10-CM

## 2015-06-18 DIAGNOSIS — I272 Pulmonary hypertension, unspecified: Secondary | ICD-10-CM | POA: Diagnosis present

## 2015-06-18 DIAGNOSIS — D509 Iron deficiency anemia, unspecified: Secondary | ICD-10-CM

## 2015-06-18 LAB — COMPREHENSIVE METABOLIC PANEL
ALK PHOS: 107 U/L (ref 38–126)
ALT: 16 U/L (ref 14–54)
AST: 13 U/L — AB (ref 15–41)
Albumin: 1.5 g/dL — ABNORMAL LOW (ref 3.5–5.0)
Anion gap: 6 (ref 5–15)
BUN: 46 mg/dL — AB (ref 6–20)
CALCIUM: 7.6 mg/dL — AB (ref 8.9–10.3)
CHLORIDE: 110 mmol/L (ref 101–111)
CO2: 19 mmol/L — ABNORMAL LOW (ref 22–32)
Creatinine, Ser: 2.31 mg/dL — ABNORMAL HIGH (ref 0.44–1.00)
GFR calc Af Amer: 30 mL/min — ABNORMAL LOW (ref >60)
GFR calc non Af Amer: 26 mL/min — ABNORMAL LOW (ref >60)
GLUCOSE: 107 mg/dL — AB (ref 65–99)
Potassium: 4.5 mmol/L (ref 3.5–5.1)
SODIUM: 135 mmol/L (ref 135–145)
Total Bilirubin: 1.2 mg/dL (ref 0.3–1.2)
Total Protein: 5.6 g/dL — ABNORMAL LOW (ref 6.5–8.1)

## 2015-06-18 LAB — URINALYSIS, ROUTINE W REFLEX MICROSCOPIC
Glucose, UA: NEGATIVE mg/dL
KETONES UR: 15 mg/dL — AB
NITRITE: NEGATIVE
PH: 5 (ref 5.0–8.0)
Protein, ur: NEGATIVE mg/dL
SPECIFIC GRAVITY, URINE: 1.014 (ref 1.005–1.030)
Urobilinogen, UA: 1 mg/dL (ref 0.0–1.0)

## 2015-06-18 LAB — CBC WITH DIFFERENTIAL/PLATELET
Basophils Absolute: 0 10*3/uL (ref 0.0–0.1)
Basophils Relative: 0 %
EOS ABS: 0 10*3/uL (ref 0.0–0.7)
EOS PCT: 0 %
HCT: 23.7 % — ABNORMAL LOW (ref 36.0–46.0)
HEMOGLOBIN: 7.5 g/dL — AB (ref 12.0–15.0)
LYMPHS PCT: 6 %
Lymphs Abs: 1.4 10*3/uL (ref 0.7–4.0)
MCH: 20.3 pg — ABNORMAL LOW (ref 26.0–34.0)
MCHC: 31.6 g/dL (ref 30.0–36.0)
MCV: 64.1 fL — ABNORMAL LOW (ref 78.0–100.0)
MONO ABS: 0.9 10*3/uL (ref 0.1–1.0)
Monocytes Relative: 4 %
NEUTROS PCT: 90 %
Neutro Abs: 20.4 10*3/uL — ABNORMAL HIGH (ref 1.7–7.7)
PLATELETS: 256 10*3/uL (ref 150–400)
RBC: 3.7 MIL/uL — AB (ref 3.87–5.11)
RDW: 24.9 % — ABNORMAL HIGH (ref 11.5–15.5)
WBC: 22.7 10*3/uL — AB (ref 4.0–10.5)

## 2015-06-18 LAB — URINE MICROSCOPIC-ADD ON

## 2015-06-18 LAB — PREPARE RBC (CROSSMATCH)

## 2015-06-18 LAB — TSH: TSH: 1.067 u[IU]/mL (ref 0.350–4.500)

## 2015-06-18 LAB — GLUCOSE, CAPILLARY
GLUCOSE-CAPILLARY: 105 mg/dL — AB (ref 65–99)
GLUCOSE-CAPILLARY: 108 mg/dL — AB (ref 65–99)
GLUCOSE-CAPILLARY: 108 mg/dL — AB (ref 65–99)
GLUCOSE-CAPILLARY: 109 mg/dL — AB (ref 65–99)
Glucose-Capillary: 114 mg/dL — ABNORMAL HIGH (ref 65–99)
Glucose-Capillary: 120 mg/dL — ABNORMAL HIGH (ref 65–99)
Glucose-Capillary: 132 mg/dL — ABNORMAL HIGH (ref 65–99)

## 2015-06-18 LAB — TROPONIN I
TROPONIN I: 0.11 ng/mL — AB (ref <0.031)
TROPONIN I: 0.1 ng/mL — AB (ref <0.031)

## 2015-06-18 LAB — T4, FREE: FREE T4: 0.87 ng/dL (ref 0.61–1.12)

## 2015-06-18 LAB — MAGNESIUM: MAGNESIUM: 1.9 mg/dL (ref 1.7–2.4)

## 2015-06-18 LAB — PHOSPHORUS: Phosphorus: 4.5 mg/dL (ref 2.5–4.6)

## 2015-06-18 MED ORDER — METOPROLOL TARTRATE 1 MG/ML IV SOLN
INTRAVENOUS | Status: AC
  Filled 2015-06-18: qty 10

## 2015-06-18 MED ORDER — DILTIAZEM HCL 25 MG/5ML IV SOLN
5.0000 mg | Freq: Once | INTRAVENOUS | Status: AC
  Administered 2015-06-18: 5 mg via INTRAVENOUS
  Filled 2015-06-18: qty 5

## 2015-06-18 MED ORDER — DILTIAZEM HCL 25 MG/5ML IV SOLN
10.0000 mg | Freq: Three times a day (TID) | INTRAVENOUS | Status: DC | PRN
Start: 2015-06-18 — End: 2015-06-19
  Administered 2015-06-18 – 2015-06-19 (×2): 10 mg via INTRAVENOUS
  Filled 2015-06-18 (×4): qty 5

## 2015-06-18 MED ORDER — METOPROLOL TARTRATE 1 MG/ML IV SOLN
10.0000 mg | Freq: Four times a day (QID) | INTRAVENOUS | Status: DC
  Administered 2015-06-18 – 2015-06-19 (×5): 10 mg via INTRAVENOUS
  Filled 2015-06-18 (×7): qty 10

## 2015-06-18 MED ORDER — METOPROLOL TARTRATE 1 MG/ML IV SOLN: 10.0000 mg | Freq: Once | INTRAVENOUS | Status: AC

## 2015-06-18 MED ORDER — DILTIAZEM HCL 25 MG/5ML IV SOLN: 10.0000 mg | Freq: Three times a day (TID) | INTRAVENOUS | Status: DC | PRN

## 2015-06-18 MED ORDER — FLORANEX PO PACK
1.0000 g | PACK | Freq: Three times a day (TID) | ORAL | Status: DC
  Administered 2015-06-18 – 2015-06-19 (×5): 1 g via ORAL
  Filled 2015-06-18 (×6): qty 1

## 2015-06-18 MED ORDER — SODIUM CHLORIDE 0.9 % IV SOLN
Freq: Once | INTRAVENOUS | Status: AC
  Administered 2015-06-18: 16:00:00 via INTRAVENOUS

## 2015-06-18 MED ORDER — SODIUM CHLORIDE 0.9 % IV BOLUS (SEPSIS)
1000.0000 mL | Freq: Once | INTRAVENOUS | Status: AC
Start: 2015-06-18 — End: 2015-06-18
  Administered 2015-06-18: 1000 mL via INTRAVENOUS

## 2015-06-18 MED ORDER — DILTIAZEM HCL 25 MG/5ML IV SOLN
10.0000 mg | Freq: Once | INTRAVENOUS | Status: AC
  Administered 2015-06-18: 10 mg via INTRAVENOUS
  Filled 2015-06-18: qty 5

## 2015-06-18 MED ORDER — METOPROLOL TARTRATE 1 MG/ML IV SOLN
5.0000 mg | INTRAVENOUS | Status: AC | PRN
  Administered 2015-06-18 (×3): 5 mg via INTRAVENOUS
  Filled 2015-06-18: qty 5

## 2015-06-18 MED ORDER — METOPROLOL TARTRATE 1 MG/ML IV SOLN
5.0000 mg | Freq: Four times a day (QID) | INTRAVENOUS | Status: DC
  Administered 2015-06-18: 5 mg via INTRAVENOUS

## 2015-06-18 MED ORDER — ADENOSINE 6 MG/2ML IV SOLN
INTRAVENOUS | Status: AC
Start: 2015-06-18 — End: 2015-06-18
  Administered 2015-06-18: 6 mg
  Filled 2015-06-18: qty 2

## 2015-06-18 MED ORDER — METOPROLOL TARTRATE 1 MG/ML IV SOLN
INTRAVENOUS | Status: AC
  Administered 2015-06-18: 5 mg
  Filled 2015-06-18: qty 5

## 2015-06-18 MED ORDER — SODIUM CHLORIDE 0.9 % IV BOLUS (SEPSIS)
500.0000 mL | Freq: Once | INTRAVENOUS | Status: AC
  Administered 2015-06-18: 500 mL via INTRAVENOUS

## 2015-06-18 NOTE — Progress Notes (Signed)
  Echocardiogram 2D Echocardiogram has been performed.  Dominique [name] 06/18/2015, 9:08 AM

## 2015-06-18 NOTE — Progress Notes (Signed)
Had pt sit on side of the bed to stand and get in chair, pt was unable to stand with assist and walker.  Placed pt back in bed and attempted to get pt into chair with lift.  Pt stated she did not feel safe in the chair and she felt she would slide out of it. Placed pt back in bed. Pt's heart rate remained in the 140's and 150's .  Dr Barry Dienes at bedside while attempting to get pt up and is aware of heart rate.  Consuelo Pandy RN

## 2015-06-18 NOTE — Progress Notes (Signed)
   06/18/15 0030  Vitals  BP 125/81 mmHg  MAP (mmHg) 94  Pulse Rate (!) 142  ECG Heart Rate (!) 142  Resp 17  paged L [place] pac pt HR 140's to 150's pt asleep BP as above EKG done will continue to monitor

## 2015-06-18 NOTE — Progress Notes (Signed)
At 1200 Pt HR 150 SVT. Notified Dr. Sherral Hammers. Received order to give metoprolol. At 1300 Pt HR still 140's and bp 90s/60s. Notified Dr. Sherral Hammers who came up to room. With Dr. Sherral Hammers at bedside, administered 34m Adenosine with no results. Gave pt 10 mg metoprolol. HR down to 130s. PRN cardizem ordered and given for HR >125. HR now down to 118. Will continue to monitor pt.

## 2015-06-18 NOTE — Progress Notes (Signed)
RT applied CPAP of via FFM per  Patient would prefer to wear CPAP. Settings AUTO titrate 20cmH2o max and 4cmH2O minimum.  Pt is tolerating at this time. RT will continue to monitor.     06/18/15 2154  BiPAP/CPAP/SIPAP  BiPAP/CPAP/SIPAP Pt Type Adult  Mask Type Full face mask  Mask Size Large  Respiratory Rate 19 breaths/min  Oxygen Percent 21 %  BiPAP/CPAP/SIPAP CPAP  Patient Home Equipment No  Auto Titrate Yes (MAX 20cmH2o MIN 4cmH2o)  BiPAP/CPAP /SiPAP Vitals  Pulse Rate (!) 117  Resp 19  SpO2 96 %  Bilateral Breath Sounds Diminished

## 2015-06-18 NOTE — Progress Notes (Signed)
[place] TEAM 1 - Stepdown/ICU TEAM Progress Note  Dominique [name] YIA:[id number] DOB: [birth year] DOA: 06/03/2015 PCP: Benito Mccreedy, MD  Admit HPI / Brief Narrative: 34 year old BF PMHx Asthma, OSA/OHS, Hepatic Steatosis and Recurrent Colitis since jan 2016  Came to the ED with cc of severe abdominal pain. She said the pain started back in Jan and improved only between Feb and May. Since June she continued to have abdominal pain with diarrhea and occasional vomiting. She has not been able to see GI for insurance and referral issues until last week. She said after discussing her last CT abdomen findings with GI, the plan for her was to continue a course of abx and come back but last Friday she started having severe abdominal pain that got more diffuse ( usually she has lower abdominal pain) associated with vomiting daily for 2-3 times ( non bilious and non bloody) and daily diarrhea ( soft stool with occasional blood0. She denied fever or chills. No other complaints. No chest pain , cough or dyspnea  HPI/Subjective: 9/25 A/O 4, negative CP, positive SOB(thinks 2dary to being moved) ,negative N/V.   Assessment/Plan: Acute /recurrent colitis:  -Patient S/P sigmoidoscopy with new diagnosis of Crohn's disease  Crohn's disease -Per biopsies from 9/12 flexible sigmoidoscopy consistent with Crohn's disease. -Continue Solu-Medrol 40 mg daily -Continue mesalamine 1000 mg QID -Continue metronidazole 500 mg TID  Sepsis unspecified organism/Perforated left colon/Peritonitis/Peritoneal abscess -9/16 patient diagnosed with perforated left colon from Crohn's colitis -Emergent exploratory laparoscopy -See Crohn's disease -Continue normal saline 33m/hr -Strict I&O; -Watch for fluid overload -Echocardiogram; CHF and pulmonary hypertension see report below -Out of bed to chair per shift  Chronic systolic CHF -EF= 270%-Compared today's EKG with EKG from 06/12/2015 no significant change. Sinus  tachycardia with criteria meeting inferior infarct undetermined age. -Transfuse for hemoglobin<8 -9/25 Transfuse one unit PRBC -9/25 CVP= 17-18; patient volume overloaded -After HR better controlled if BP will tolerate will gently diuresis  Pulmonary hypertension -Patient's BP will not tolerate a vasal dilator.  Asthma:  -Albuterol nebulizer PRN -Stable  OSA/OHS -CPAP/BIPAP per respiratory -If patient declined secondary to refractory nausea please use Thorazine  Acute respiratory failure with hypoxia/VDRF -Resolved  Anemia of critical illness  -Monitor closely  Acute renal failure -Gentle hydration.   Acute encephalopathy -Resolved    Code Status: FULL Family Communication: family present at time of exam Disposition Plan: SNF vs CIR    Consultants: Dr.Robert Buccini ([facility]) Dr.Burke TGrandville Silos(Trauma surgery) [facility]cil Linsey[facility]   Procedure/Significant Events: 9/12 FLEXIBLE SIGMOIDOSCOPY ;Severe chronic inflammatory changes in the proximal sigmoid and descending colon 9/16 Emergent exploratory laparoscopy; perforated descending colon, colitis descending/sigmoid colon 9/18 Exploratory Laparoscopy, reexploration of abdomen with closure and mobilization of splenic flexure; creation of colostomy 9/25 echocardiogram;Left ventricle: moderately dilated. -mild LVH. -LVEF=  20%. Diffuse hypokinesis.  - Pulmonary arteries: PA peak pressure: 40 mm Hg (S) + RAP. - Pericardium, A trivial pericardial effusion  Culture Urine 9/10 >> negative C diff 9/13 >> negative 9/16 Peritoneal fluid negative   Antibiotics: Ceftriaxone x1 >> 9/10 Flagyl 9/11 >> 9/13 Flagyl 9/16 >>  Cipro 9/16 >> stopped 9/23  - Per notes has been on cipro + flagyl as an outpt as well   DVT prophylaxis: Heparin   Devices    LINES / TUBES:  ETT 06/09/15 Art Line, 06/09/15 R IJ CVC 06/09/15 NGT 06/09/15 Foley 06/09/15     Continuous Infusions: . sodium chloride 100 mL/hr at  06/18/15 1600  Objective: VITAL SIGNS: Temp: 98.4 F (36.9 C) (09/25 1812) Temp Source: Oral (09/25 1812) BP: 122/95 mmHg (09/25 1812) Pulse Rate: 113 (09/25 1812) SPO2; FIO2:   Intake/Output Summary (Last 24 hours) at 06/18/15 1825 Last data filed at 06/18/15 1812  Gross per 24 hour  Intake   2998 ml  Output    770 ml  Net   2228 ml     Exam: General: A/O 4, morbidly obese, No acute respiratory distress Eyes: Negative headache, negative scleral hemorrhage ENT: Negative Runny nose, negative ear pain, negative gingival bleeding, Neck:  Negative scars, masses, torticollis, lymphadenopathy, JVD Lungs: Clear to auscultation bilaterally without wheezes or crackles, bronchial rhonchus sounds (patient unable to clear sputum) Cardiovascular: Tachycardic, Regular rhythm without murmur gallop or rub normal S1 and S2 Abdomen: Positive Abdominal pain (appropriate for recent surgery), morbidly obese, positive soft, bowel sounds, midline vertical incision covered and clean, good granulation tissue, negative sign of infection, ostomy bag in place LLQ negative sign of blood, negative leakage. no rebound, no ascites, no appreciable mass Extremities: No significant cyanosis, clubbing, or edema bilateral lower extremities Psychiatric:  Negative depression, negative anxiety, negative fatigue, negative mania Neurologic:  Cranial nerves II through XII intact, tongue/uvula midline, all extremities muscle strength 5/5, sensation intact throughout, negative dysarthria, negative expressive aphasia, negative receptive aphasia.   Data Reviewed: Basic Metabolic Panel:  Recent Labs Lab 06/11/15 2345 06/12/15 0341  06/14/15 0400 06/15/15 0430 06/16/15 0350 06/17/15 0500 06/17/15 0814 06/18/15 0330  NA 132* 135  < > 131* 130* 132* 135  --  135  K 4.1 4.2  < > 4.4 4.9 4.7 4.4  --  4.5  CL 101 106  < > 103 103 105 105  --  110  CO2 24 24  < > 21* 21* 21* 21*  --  19*  GLUCOSE 97 100*  < > 96  116* 99 98  --  107*  BUN 11 12  < > 29* 37* 45* 49*  --  46*  CREATININE 0.85 0.87  < > 2.64* 2.84* 2.75* 2.60*  --  2.31*  CALCIUM 6.8* 6.9*  < > 6.9* 7.0* 7.2* 7.5*  --  7.6*  MG 1.8 1.7  --   --   --   --   --  1.8 1.9  PHOS 4.1 4.3  --   --  6.6* 5.9*  --  4.7* 4.5  < > = values in this interval not displayed. Liver Function Tests:  Recent Labs Lab 06/11/15 2345 06/15/15 0430 06/16/15 0350 06/18/15 0330  AST 34  --   --  13*  ALT 18  --   --  16  ALKPHOS 115  --   --  107  BILITOT 0.7  --   --  1.2  PROT 4.9*  --   --  5.6*  ALBUMIN 1.4* 1.2* 1.3* 1.5*   No results for input(s): LIPASE, AMYLASE in the last 168 hours. No results for input(s): AMMONIA in the last 168 hours. CBC:  Recent Labs Lab 06/14/15 0400 06/15/15 0430 06/15/15 1450 06/16/15 0350 06/16/15 1814 06/17/15 0500 06/18/15 0330  WBC 15.9* 19.4*  --  23.8*  --  24.5* 22.7*  NEUTROABS  --   --   --   --   --   --  20.4*  HGB 8.2* 7.1* 7.7* 7.0* 6.6* 7.8* 7.5*  HCT 28.0* 23.0* 24.7* 22.0* 21.1* 23.9* 23.7*  MCV 62.2* 61.5*  --  59.9*  --  62.2*  64.1*  PLT 205 178  --  196  --  241 256   Cardiac Enzymes:  Recent Labs Lab 06/11/15 2345 06/17/15 0814 06/17/15 1508 06/17/15 2000 06/18/15 1403  TROPONINI <0.03 <0.03 0.03 0.09* 0.11*   BNP (last 3 results) No results for input(s): BNP in the last 8760 hours.  ProBNP (last 3 results) No results for input(s): PROBNP in the last 8760 hours.  CBG:  Recent Labs Lab 06/17/15 2347 06/18/15 0403 06/18/15 0741 06/18/15 1132 06/18/15 1650  GLUCAP 114* 108* 109* 108* 132*    Recent Results (from the past 240 hour(s))  Anaerobic culture     Status: None   Collection Time: 06/09/15  8:33 PM  Result Value Ref Range Status   Specimen Description FLUID PERITONEAL  Final   Special Requests CIPRO AND FLAGYL  Final   Gram Stain   Final    FEW WBC PRESENT,BOTH PMN AND MONONUCLEAR NO ORGANISMS SEEN    Culture NO ANAEROBES ISOLATED  Final   Report  Status 06/15/2015 FINAL  Final  Body fluid culture     Status: None   Collection Time: 06/09/15  8:33 PM  Result Value Ref Range Status   Specimen Description FLUID PERITONEAL  Final   Special Requests CIPRO AND FLAGYL  Final   Gram Stain   Final    FEW WBC PRESENT,BOTH PMN AND MONONUCLEAR NO ORGANISMS SEEN    Culture NO GROWTH 3 DAYS  Final   Report Status 06/13/2015 FINAL  Final  MRSA PCR Screening     Status: None   Collection Time: 06/13/15 11:15 AM  Result Value Ref Range Status   MRSA by PCR NEGATIVE NEGATIVE Final    Comment:        The GeneXpert MRSA Assay (FDA approved for NASAL specimens only), is one component of a comprehensive MRSA colonization surveillance program. It is not intended to diagnose MRSA infection nor to guide or monitor treatment for MRSA infections.      Studies:  Recent x-ray studies have been reviewed in detail by the Attending Physician  Scheduled Meds:  Scheduled Meds: . sodium chloride   Intravenous Once  . antiseptic oral rinse  7 mL Mouth Rinse QID  . chlorhexidine gluconate  15 mL Mouth Rinse BID  . heparin subcutaneous  5,000 Units Subcutaneous 3 times per day  . Influenza vac split quadrivalent PF  0.5 mL Intramuscular Tomorrow-1000  . insulin aspart  0-20 Units Subcutaneous 6 times per day  . lactobacillus  1 g Oral TID WC  . mesalamine  1,000 mg Oral QID  . methylPREDNISolone (SOLU-MEDROL) injection  40 mg Intravenous Daily  . metoCLOPramide  5 mg Oral TID AC  . metoprolol  10 mg Intravenous 4 times per day  . pantoprazole (PROTONIX) IV  40 mg Intravenous Daily  . pneumococcal 13-valent conjugate vaccine  0.5 mL Intramuscular Tomorrow-1000    Time spent on care of this patient: 40 mins   WOODS, Geraldo Docker , MD  [facility] Office  [phone] Pager - [phone]  On-Call/Text Page:      [email or web address]      password [email or web address]  If 7PM-7AM, please contact night-coverage [email or web address] Password [email or web address] 06/18/2015, 6:25  PM   LOS: 14 days   Care during the described time interval was provided by me .  I have reviewed this patient's available data, including medical history, events of note, physical examination, and all test results as part of my evaluation. I have personally reviewed and interpreted  all radiology studies.   Dia Crawford, MD [phone] Pager

## 2015-06-18 NOTE — Progress Notes (Signed)
Patient ID: Dominique [name], female   DOB: Sep 03, 1981, 34 y.o.   MRN: [id number] 7 Days Post-Op  Subjective: Pt with some nausea yesterday.  Required 3 dosed zofran.    Objective: Vital signs in last 24 hours: Temp:  [97.2 F (36.2 C)-98.2 F (36.8 C)] 98.2 F (36.8 C) (09/25 0800) Pulse Rate:  [104-196] 116 (09/25 0800) Resp:  [14-23] 16 (09/25 0800) BP: (105-141)/(65-99) 107/81 mmHg (09/25 0800) SpO2:  [90 %-99 %] 95 % (09/25 0800) Last BM Date: 06/18/15  Intake/Output from previous day: 09/24 0701 - 09/25 0700 In: 3608.3 [P.O.:480; I.V.:2103.3; IV Piggyback:1025] Out: 1115 [Urine:1040; Stool:75] Intake/Output this shift: Total I/O In: 566.7 [P.O.:120; I.V.:446.7] Out: -   General appearance: alert and mild distress, trying to get out of bed with 3 assistants.   GI: soft, midline dressing in place.     Lab Results:   Recent Labs  06/17/15 0500 06/18/15 0330  WBC 24.5* 22.7*  HGB 7.8* 7.5*  HCT 23.9* 23.7*  PLT 241 256   BMET  Recent Labs  06/17/15 0500 06/18/15 0330  NA 135 135  K 4.4 4.5  CL 105 110  CO2 21* 19*  GLUCOSE 98 107*  BUN 49* 46*  CREATININE 2.60* 2.31*  CALCIUM 7.5* 7.6*   PT/INR No results for input(s): LABPROT, INR in the last 72 hours. ABG No results for input(s): PHART, HCO3 in the last 72 hours.  Invalid input(s): PCO2, PO2  Studies/Results: No results found.  Anti-infectives: Anti-infectives    Start     Dose/Rate Route Frequency Ordered Stop   06/14/15 1800  ciprofloxacin (CIPRO) IVPB 200 mg     200 mg 100 mL/hr over 60 Minutes Intravenous Every 12 hours 06/14/15 1438 06/16/15 1834   06/10/15 1800  ciprofloxacin (CIPRO) IVPB 400 mg  Status:  Discontinued     400 mg 200 mL/hr over 60 Minutes Intravenous Every 12 hours 06/10/15 1734 06/14/15 1438   06/09/15 1927  ciprofloxacin (CIPRO) 400 MG/200ML IVPB    Comments:  Ubaldo Glassing   : cabinet override      06/09/15 1927 06/09/15 1953   06/09/15 1830  ciprofloxacin (CIPRO)  IVPB 400 mg  Status:  Discontinued     400 mg 200 mL/hr over 60 Minutes Intravenous  Once 06/09/15 1825 06/10/15 1734   06/09/15 1830  metroNIDAZOLE (FLAGYL) IVPB 500 mg     500 mg 100 mL/hr over 60 Minutes Intravenous Every 8 hours 06/09/15 1825 06/16/15 1930   06/04/15 1200  metroNIDAZOLE (FLAGYL) IVPB 500 mg  Status:  Discontinued     500 mg 100 mL/hr over 60 Minutes Intravenous Every 8 hours 06/04/15 1034 06/07/15 1405   06/04/15 0015  cefTRIAXone (ROCEPHIN) 1 g in dextrose 5 % 50 mL IVPB     1 g 100 mL/hr over 30 Minutes Intravenous  Once 06/04/15 0012 06/04/15 0108      Assessment/Plan: s/p Procedure(s): RE-EXPLORATION OF ABDOMEN (N/A)  CREATION OF COLOSTOMY (N/A) Continue clears while nausea Work on getting out of bed.  Discussed possible lift with nurses if patient was not able to get out on her own.   Con't BID dressing changes  Abx Elev WBC likely secondary to Steroids.  No fevers. Continue stepdown due to nursing care required.     LOS: 14 days    BYERLY,FAERA 06/18/2015

## 2015-06-18 NOTE — Progress Notes (Signed)
   06/18/15 0110  Vitals  BP (!) 141/85 mmHg  MAP (mmHg) 101  Pulse Rate (!) 196  ECG Heart Rate (!) 196  Resp (!) 23  pt in SVT up to 200's verified with ekg L [place] paged 5 mg lopressor given will continue monitor

## 2015-06-19 LAB — TYPE AND SCREEN
ABO/RH(D): O POS
ANTIBODY SCREEN: NEGATIVE
Unit division: 0
Unit division: 0

## 2015-06-19 LAB — COMPREHENSIVE METABOLIC PANEL
ALT: 16 U/L (ref 14–54)
AST: 15 U/L (ref 15–41)
Albumin: 1.7 g/dL — ABNORMAL LOW (ref 3.5–5.0)
Alkaline Phosphatase: 101 U/L (ref 38–126)
Anion gap: 6 (ref 5–15)
BILIRUBIN TOTAL: 1 mg/dL (ref 0.3–1.2)
BUN: 45 mg/dL — AB (ref 6–20)
CHLORIDE: 111 mmol/L (ref 101–111)
CO2: 21 mmol/L — ABNORMAL LOW (ref 22–32)
CREATININE: 2.03 mg/dL — AB (ref 0.44–1.00)
Calcium: 8.1 mg/dL — ABNORMAL LOW (ref 8.9–10.3)
GFR calc Af Amer: 36 mL/min — ABNORMAL LOW (ref >60)
GFR, EST NON AFRICAN AMERICAN: 31 mL/min — AB (ref >60)
GLUCOSE: 109 mg/dL — AB (ref 65–99)
Potassium: 4.6 mmol/L (ref 3.5–5.1)
Sodium: 138 mmol/L (ref 135–145)
TOTAL PROTEIN: 5.8 g/dL — AB (ref 6.5–8.1)

## 2015-06-19 LAB — CBC WITH DIFFERENTIAL/PLATELET
Basophils Absolute: 0 10*3/uL (ref 0.0–0.1)
Basophils Relative: 0 %
EOS PCT: 0 %
Eosinophils Absolute: 0 10*3/uL (ref 0.0–0.7)
HEMATOCRIT: 27.6 % — AB (ref 36.0–46.0)
Hemoglobin: 8.4 g/dL — ABNORMAL LOW (ref 12.0–15.0)
LYMPHS PCT: 2 %
Lymphs Abs: 0.5 10*3/uL — ABNORMAL LOW (ref 0.7–4.0)
MCH: 21.1 pg — ABNORMAL LOW (ref 26.0–34.0)
MCHC: 30.4 g/dL (ref 30.0–36.0)
MCV: 69.2 fL — AB (ref 78.0–100.0)
MONOS PCT: 5 %
Monocytes Absolute: 1.4 10*3/uL — ABNORMAL HIGH (ref 0.1–1.0)
NEUTROS PCT: 93 %
Neutro Abs: 25.5 10*3/uL — ABNORMAL HIGH (ref 1.7–7.7)
Platelets: 412 10*3/uL — ABNORMAL HIGH (ref 150–400)
RBC: 3.99 MIL/uL (ref 3.87–5.11)
RDW: 25.6 % — ABNORMAL HIGH (ref 11.5–15.5)
WBC: 27.4 10*3/uL — AB (ref 4.0–10.5)

## 2015-06-19 LAB — TROPONIN I: Troponin I: 0.1 ng/mL — ABNORMAL HIGH (ref <0.031)

## 2015-06-19 LAB — URINE CULTURE: Culture: >=100000

## 2015-06-19 LAB — GLUCOSE, CAPILLARY
GLUCOSE-CAPILLARY: 106 mg/dL — AB (ref 65–99)
GLUCOSE-CAPILLARY: 107 mg/dL — AB (ref 65–99)
GLUCOSE-CAPILLARY: 124 mg/dL — AB (ref 65–99)
GLUCOSE-CAPILLARY: 144 mg/dL — AB (ref 65–99)
Glucose-Capillary: 122 mg/dL — ABNORMAL HIGH (ref 65–99)

## 2015-06-19 LAB — PHOSPHORUS: PHOSPHORUS: 4.1 mg/dL (ref 2.5–4.6)

## 2015-06-19 LAB — MAGNESIUM: MAGNESIUM: 2 mg/dL (ref 1.7–2.4)

## 2015-06-19 MED ORDER — PANTOPRAZOLE SODIUM 40 MG PO TBEC
40.0000 mg | DELAYED_RELEASE_TABLET | Freq: Every day | ORAL | Status: DC
  Administered 2015-06-20 – 2015-06-26 (×7): 40 mg via ORAL
  Filled 2015-06-19 (×8): qty 1

## 2015-06-19 MED ORDER — ENSURE ENLIVE PO LIQD
237.0000 mL | Freq: Two times a day (BID) | ORAL | Status: DC
  Administered 2015-06-19 – 2015-06-23 (×8): 237 mL via ORAL

## 2015-06-19 MED ORDER — METOPROLOL TARTRATE 25 MG PO TABS
25.0000 mg | ORAL_TABLET | Freq: Two times a day (BID) | ORAL | Status: DC
  Administered 2015-06-20: 25 mg via ORAL
  Filled 2015-06-19 (×2): qty 1

## 2015-06-19 MED ORDER — PRO-STAT SUGAR FREE PO LIQD
30.0000 mL | Freq: Two times a day (BID) | ORAL | Status: DC
  Administered 2015-06-19 – 2015-06-23 (×6): 30 mL via ORAL
  Filled 2015-06-19 (×10): qty 30

## 2015-06-19 MED ORDER — SACCHAROMYCES BOULARDII 250 MG PO CAPS
250.0000 mg | ORAL_CAPSULE | Freq: Two times a day (BID) | ORAL | Status: DC
  Administered 2015-06-19 – 2015-06-26 (×14): 250 mg via ORAL
  Filled 2015-06-19 (×15): qty 1

## 2015-06-19 MED ORDER — MESALAMINE ER 250 MG PO CPCR
500.0000 mg | ORAL_CAPSULE | Freq: Four times a day (QID) | ORAL | Status: DC
  Administered 2015-06-19 – 2015-06-26 (×26): 500 mg via ORAL
  Filled 2015-06-19 (×29): qty 2

## 2015-06-19 MED ORDER — CHLORHEXIDINE GLUCONATE 0.12 % MT SOLN
15.0000 mL | Freq: Two times a day (BID) | OROMUCOSAL | Status: DC
  Administered 2015-06-19 – 2015-06-20 (×3): 15 mL via OROMUCOSAL
  Filled 2015-06-19 (×4): qty 15

## 2015-06-19 NOTE — Progress Notes (Signed)
Rehab admissions - Evaluated for possible admission.  I met with patient.  She lives with her mom and sister.  Patient would like to come to inpatient rehab once she is medically ready.  Will await medical readiness.  Call me for questions.  #[phone]

## 2015-06-19 NOTE — Progress Notes (Signed)
[place] TEAM 1 - Stepdown/ICU TEAM PROGRESS NOTE  Dominique [name] LKJ:[id number] DOB: [birth year] DOA: 06/03/2015 PCP: Benito Mccreedy, MD  Admit HPI / Brief Narrative: 34 year old F Hx Asthma, OSA/OHS, Hepatic Steatosis, and Recurrent Colitis since Jan 2016 who presented to the ED with severe abdominal pain with diarrhea and occasional vomiting. She had not been able to see GI due insurance and referral issues. When her sx became worse than her usual, she presented to the ED.    HPI/Subjective: The patient is resting comfortably in bed, visit.  She denies chest pain shortness breath fevers chills nausea or vomiting.  Assessment/Plan:  Newly diagnosed Crohn's disease   -S/P 9/12 flexible sigmoidoscopy w/ bx consistent with Crohn's disease -P[place] 500 QID per GI recs  -stop steroids as no role presently per GI opinion   Sepsis due to Perforated left colon w/ Peritonitis and Peritoneal abscess -9/16 patient diagnosed with perforated left colon from Crohn's colitis -Emergent exploratory laparoscopy -ongoing care per Gen Surgery   Persistent Sinus tachycardia  -no evidence of PE on angiogram 9/16 - TSH normal - likely related to severe CHF - trial of scheduled BB as pt not in acute CHF exac  Severe systolic CHF - ?newly diagnosed  -EF 20% per TTE 06/18/15 w/ diffuse hypokinesis - ?Takotsubo type CM - ?tachycardia related - will ask for Cardiology consult in AM   Asthma -Albuterol nebulizer PRN -quiescent   OSA/OHS -CPAP/BIPAP QHS per respiratory   Acute respiratory failure with hypoxia/VDRF -Resolved  Anemia of critical illness  -hemoglobin slowly climbing  Acute renal failure due to septic shock/ATN -slowly improving   Acute encephalopathy -Resolved  Severe malnutrition in context of acute illness/injury  Morbid Obesity - Body mass index is 67.33 kg/(m^2).  Code Status: FULL Family Communication: no family present at time of exam Disposition Plan: hopeful for  eventual CIR  Consultants: [facility] Gen Surgery PCCM  Procedures: 9/12 FLEX SIG - Severe chronic inflammatory changes in the proximal sigmoid and descending colon 9/16 Emergent exploratory laparoscopy perforated descending colon, colitis descending/sigmoid colon 9/18 Exploratory Laparoscopy, reexploration of abdomen with closure and mobilization of splenic flexure; creation of colostomy 9/25 TTE Left ventricle: moderately dilated. -mild LVH. -LVEF= 20%. Diffuse hypokinesis.  - Pulmonary arteries: PA peak pressure: 40 mm Hg (S) + RAP. - Pericardium, A trivial pericardial effusion  Antibiotics: Ceftriaxone 9/10 Flagyl 9/11 > 9/13 Flagyl 9/16 > 9/23 Cipro 9/16 > 9/23  DVT prophylaxis: SQ Heparin   Objective: Blood pressure 112/71, pulse 138, temperature 98.7 F (37.1 C), temperature source Oral, resp. rate 17, height 5' 2"  (1.575 m), weight 167.009 kg (368 lb 3 oz), last menstrual period 05/06/2015, SpO2 97 %.  Intake/Output Summary (Last 24 hours) at 06/19/15 1548 Last data filed at 06/19/15 1500  Gross per 24 hour  Intake 2686.66 ml  Output   1275 ml  Net 1411.66 ml   Exam: General: No acute respiratory distress Lungs: Clear to auscultation bilaterally without wheezes or crackles - distant HS  Cardiovascular: Distant heart sounds - Tachycardic at 120 bpm without appreciable murmur or rub  Abdomen: Nontender, nondistended, soft, bowel sounds positive, no rebound, no ascites, no appreciable mass Extremities: No significant cyanosis, clubbing; 1+ edema bilateral lower extremities  Data Reviewed: Basic Metabolic Panel:  Recent Labs Lab 06/15/15 0430 06/16/15 0350 06/17/15 0500 06/17/15 0814 06/18/15 0330 06/19/15 0300  NA 130* 132* 135  --  135 138  K 4.9 4.7 4.4  --  4.5 4.6  CL 103 105  105  --  110 111  CO2 21* 21* 21*  --  19* 21*  GLUCOSE 116* 99 98  --  107* 109*  BUN 37* 45* 49*  --  46* 45*  CREATININE 2.84* 2.75* 2.60*  --  2.31* 2.03*  CALCIUM 7.0*  7.2* 7.5*  --  7.6* 8.1*  MG  --   --   --  1.8 1.9 2.0  PHOS 6.6* 5.9*  --  4.7* 4.5 4.1    CBC:  Recent Labs Lab 06/15/15 0430  06/16/15 0350 06/16/15 1814 06/17/15 0500 06/18/15 0330 06/19/15 0300  WBC 19.4*  --  23.8*  --  24.5* 22.7* 27.4*  NEUTROABS  --   --   --   --   --  20.4* 25.5*  HGB 7.1*  < > 7.0* 6.6* 7.8* 7.5* 8.4*  HCT 23.0*  < > 22.0* 21.1* 23.9* 23.7* 27.6*  MCV 61.5*  --  59.9*  --  62.2* 64.1* 69.2*  PLT 178  --  196  --  241 256 412*  < > = values in this interval not displayed.  Liver Function Tests:  Recent Labs Lab 06/15/15 0430 06/16/15 0350 06/18/15 0330 06/19/15 0300  AST  --   --  13* 15  ALT  --   --  16 16  ALKPHOS  --   --  107 101  BILITOT  --   --  1.2 1.0  PROT  --   --  5.6* 5.8*  ALBUMIN 1.2* 1.3* 1.5* 1.7*    Cardiac Enzymes:  Recent Labs Lab 06/17/15 1508 06/17/15 2000 06/18/15 1403 06/18/15 2015 06/19/15 0300  TROPONINI 0.03 0.09* 0.11* 0.10* 0.10*    CBG:  Recent Labs Lab 06/18/15 1955 06/18/15 2330 06/19/15 0337 06/19/15 0739 06/19/15 1159  GLUCAP 120* 105* 106* 107* 124*    Recent Results (from the past 240 hour(s))  Anaerobic culture     Status: None   Collection Time: 06/09/15  8:33 PM  Result Value Ref Range Status   Specimen Description FLUID PERITONEAL  Final   Special Requests CIPRO AND FLAGYL  Final   Gram Stain   Final    FEW WBC PRESENT,BOTH PMN AND MONONUCLEAR NO ORGANISMS SEEN    Culture NO ANAEROBES ISOLATED  Final   Report Status 06/15/2015 FINAL  Final  Body fluid culture     Status: None   Collection Time: 06/09/15  8:33 PM  Result Value Ref Range Status   Specimen Description FLUID PERITONEAL  Final   Special Requests CIPRO AND FLAGYL  Final   Gram Stain   Final    FEW WBC PRESENT,BOTH PMN AND MONONUCLEAR NO ORGANISMS SEEN    Culture NO GROWTH 3 DAYS  Final   Report Status 06/13/2015 FINAL  Final  MRSA PCR Screening     Status: None   Collection Time: 06/13/15 11:15 AM    Result Value Ref Range Status   MRSA by PCR NEGATIVE NEGATIVE Final    Comment:        The GeneXpert MRSA Assay (FDA approved for NASAL specimens only), is one component of a comprehensive MRSA colonization surveillance program. It is not intended to diagnose MRSA infection nor to guide or monitor treatment for MRSA infections.   Culture, Urine     Status: None   Collection Time: 06/18/15  3:30 AM  Result Value Ref Range Status   Specimen Description URINE, CATHETERIZED  Final   Special Requests NONE  Final  Culture >=100,000 COLONIES/mL LACTOBACILLUS SPECIES  Final   Report Status 06/19/2015 FINAL  Final     Studies:   Recent x-ray studies have been reviewed in detail by the Attending Physician  Scheduled Meds:  Scheduled Meds: . sodium chloride   Intravenous Once  . antiseptic oral rinse  7 mL Mouth Rinse QID  . chlorhexidine  15 mL Mouth/Throat BID  . feeding supplement (ENSURE ENLIVE)  237 mL Oral BID BM  . feeding supplement (PRO-STAT SUGAR FREE 64)  30 mL Oral BID  . heparin subcutaneous  5,000 Units Subcutaneous 3 times per day  . Influenza vac split quadrivalent PF  0.5 mL Intramuscular Tomorrow-1000  . insulin aspart  0-20 Units Subcutaneous 6 times per day  . lactobacillus  1 g Oral TID WC  . mesalamine  1,000 mg Oral QID  . methylPREDNISolone (SOLU-MEDROL) injection  40 mg Intravenous Daily  . metoCLOPramide  5 mg Oral TID AC  . metoprolol  10 mg Intravenous 4 times per day  . [START ON 06/20/2015] pantoprazole  40 mg Oral Daily  . pneumococcal 13-valent conjugate vaccine  0.5 mL Intramuscular Tomorrow-1000    Time spent on care of this patient: 35 mins   MCCLUNG,JEFFREY T , MD   [facility] Office  [phone] Pager - Text Page per Shea Evans as per below:  On-Call/Text Page:      [email or web address]      password [email or web address]  If 7PM-7AM, please contact night-coverage [email or web address] Password [email or web address] 06/19/2015, 3:48 PM   LOS: 15 days

## 2015-06-19 NOTE — H&P (Signed)
Physical Medicine and Rehabilitation Admission H&P    Chief Complaint  Patient presents with  . Abdominal Pain  : HPI: Dominique [name] is a 34 y.o. right handed morbidly obese female admitted 06/04/2015 with noted history of asthma, chronic systolic congestive heart failure and recent recurrent colitis since January 2016. Patient lives with family independent prior to admission. Presented with severe abdominal pain with occasional vomiting and nausea. Placed on intravenous Cipro and Flagyl. Underwent sigmoidoscopy 06/05/2015 showing severe chronic inflammatory changes in the proximal sigmoid and descending colon. Biopsies diagnosed with Crohn disease maintained on Solu-Medrol as well as mesalamine. CT angiogram and imaging revealed free intraperitoneal air/perforated viscus likely perforated left colon from Crohn's colitis. Underwent exploratory laparotomy drainage of intra-abdominal abscess partial colon resection application of wound VAC 06/09/2015 per Dr. Grandville Silos followed by reexploration of abdomen with closure creation of colostomy 06/11/2015.. Follow-up wound care nurse for ostomy care. Dressing changes wet-to-dry every shift. Hospital course respiratory failure required intubation followed by critical care medicine. She was extubated 06/14/2015. Hospital course acute blood loss anemia 7.0 transfused with latest hemoglobin 7.8 and monitored. Acute renal insufficiency creatinine 2.64-2.84. Renal ultrasound showing no hydronephrosis with latest creatinine 1. I will 9. Bouts of SVT responding to intravenous Cardizem transitioned to by mouth Lopressor and followed by cardiology services. Echocardiogram 06/18/2015 with ejection fraction of 75% normal systolic function. Subcutaneous heparin for DVT prophylaxis. Diet slowly advanced from clear liquids to mechanical soft. Physical therapy evaluation completed 06/15/2015 with recommendations of physical medicine rehabilitation consult. Patient was admitted  for a comprehensive rehabilitation program  Review of Systems  Neurological: Positive for weakness.  All other systems reviewed and are negative.  Review of Systems  Constitutional: Negative for fever and chills.  HENT: Negative for hearing loss.  Respiratory: Positive for shortness of breath.  Cardiovascular: Negative for chest pain, palpitations and leg swelling.  Gastrointestinal: Positive for nausea, vomiting, abdominal pain and diarrhea.  Genitourinary: Negative for dysuria and hematuria.  Skin: Negative for rash.  Neurological: Negative for dizziness, seizures, loss of consciousness and headaches    Past Medical History  Diagnosis Date  . Asthma   . Sleep apnea   . Anemia   . Morbid obesity 03/22/2009    Qualifier: Diagnosis of  By: Ronnald Ramp CMA, [place]    . OSA (obstructive sleep apnea) 05/02/2014  . Thyromegaly 05/02/2014  . Seasonal allergies 06/13/2014  . Acute acalculous cholecystitis 11/16/2014  . Diverticulitis of colon 11/17/2014  . Hepatic steatosis 11/17/2014   Past Surgical History  Procedure Laterality Date  . C section 2011  2011  . Flexible sigmoidoscopy N/A 06/05/2015    Procedure: FLEXIBLE SIGMOIDOSCOPY;  Surgeon: Ronald Lobo, MD;  Location: [facility];  Service: Endoscopy;  Laterality: N/A;  . Laparotomy N/A 06/09/2015    Procedure: EXPLORATORY LAPAROTOMY, DRAINAGE OF INTRAABDOMINAL ABSCESS, PARTIAL COLON RESECTION,  APPLICATION OF WOUND VAC;  Surgeon: Georganna Skeans, MD;  Location: [facility];  Service: General;  Laterality: N/A;  . Laparotomy N/A 06/11/2015    Procedure: RE-EXPLORATION OF ABDOMEN;  Surgeon: Georganna Skeans, MD;  Location: [facility];  Service: General;  Laterality: N/A;  . Colostomy N/A 06/11/2015    Procedure:  CREATION OF COLOSTOMY;  Surgeon: Georganna Skeans, MD;  Location: [facility] OR;  Service: General;  Laterality: N/A;   Family History  Problem Relation Age of Onset  . Hypertension Mother   . Hypertension Father   . Hypertension Sister   .  Asthma Brother   . Diabetes Maternal Grandmother   .  Hypertension Maternal Grandmother   . Hypertension Maternal Grandfather   . Cancer Paternal Grandmother     lung  . Emphysema Paternal Grandfather   . Allergies Mother    Social History:  reports that she has quit smoking. Her smoking use included Cigarettes. She started smoking about 11 months ago. She has a 2 pack-year smoking history. She has never used smokeless tobacco. She reports that she does not drink alcohol or use illicit drugs. Allergies:  Allergies  Allergen Reactions  . Other Shortness Of Breath and Swelling    Tree nuts  . Penicillins     Unknown childhood allergy   Medications Prior to Admission  Medication Sig Dispense Refill  . ferrous sulfate 325 (65 FE) MG tablet Take 2 tablets (650 mg total) by mouth daily with breakfast. 60 tablet 3  . ondansetron (ZOFRAN) 4 MG tablet Take 1 tablet (4 mg total) by mouth every 6 (six) hours. 12 tablet 0  . [DISCONTINUED] ciprofloxacin (CIPRO) 500 MG tablet Take 1 tablet (500 mg total) by mouth 2 (two) times daily. One po bid x 7 days 14 tablet 0  . [DISCONTINUED] HYDROcodone-acetaminophen (NORCO/VICODIN) 5-325 MG per tablet TAKE 1 TABLET BY MOUTH EVERY 6 HOURS AS NEEDED FOR PAIN  0  . [DISCONTINUED] metroNIDAZOLE (FLAGYL) 500 MG tablet Take 1 tablet (500 mg total) by mouth 3 (three) times daily. One po tid x 7 days 21 tablet 0  . albuterol (PROVENTIL) (2.5 MG/3ML) 0.083% nebulizer solution Take 3 mLs (2.5 mg total) by nebulization every 6 (six) hours as needed for wheezing or shortness of breath. (Patient not taking: Reported on 06/03/2015) 360 mL 0  . PROAIR HFA 108 (90 BASE) MCG/ACT inhaler INHALE 2 PUFFS BY MOUTH INTO THE LUNGS EVERY 6 HOURS AS NEEDED FOR WHEEZING OR SHORTNESS OF BREATH 8.5 g 3  . [DISCONTINUED] beclomethasone (QVAR) 80 MCG/ACT inhaler Inhale 2 puffs into the lungs 2 (two) times daily. (Patient not taking: Reported on 06/03/2015) 1 Inhaler 12  . [DISCONTINUED]  cyclobenzaprine (FLEXERIL) 10 MG tablet Take 1 tablet (10 mg total) by mouth at bedtime. (Patient not taking: Reported on 05/25/2015) 30 tablet 1  . [DISCONTINUED] pantoprazole (PROTONIX) 40 MG tablet Take 1 tablet (40 mg total) by mouth daily. (Patient not taking: Reported on 05/20/2015) 30 tablet 1  . [DISCONTINUED] ranitidine (ZANTAC) 150 MG capsule Take 1 capsule (150 mg total) by mouth 2 (two) times daily. (Patient not taking: Reported on 05/20/2015) 60 capsule 0  . [DISCONTINUED] torsemide (DEMADEX) 20 MG tablet Take 1 tablet (20 mg total) by mouth daily. (Patient not taking: Reported on 06/03/2015) 30 tablet 3    Home: Home Living Family/patient expects to be discharged to:: Private residence Living Arrangements: Children, Parent (70 y.o. daughter) Available Help at Discharge: Family Type of Home: Apartment Home Access: Stairs to enter Technical brewer of Steps: 8 Entrance Stairs-Rails: Right, Left, Can reach both Home Layout: One level Bathroom Shower/Tub: Chiropodist: Standard Home Equipment: None   Functional History: Prior Function Level of Independence: Independent Comments: reports she was weak due to illness last several days at home, but did ascend 8 steps to parking lot and came to hospital via car  Functional Status:  Mobility: Bed Mobility Overal bed mobility: Needs Assistance Bed Mobility: Rolling Rolling: Max assist, +2 for physical assistance Sidelying to sit:  (+3 best) Supine to sit: +2 for physical assistance, Total assist (Pt does try to help, but due to her body habitus and the bed she  is in it is diffcult for her to help) Sit to supine: Total assist (+3 (one for her trunk and one for each leg)) General bed mobility comments: Rolling multiple times to get on/off the bed pan Transfers Overall transfer level: Needs assistance Equipment used: Rolling walker (2 wheeled) (bariatric) Transfer via Lift Equipment: Maximove Transfers: Sit  to/from Stand Sit to Stand: Total assist, +2 physical assistance General transfer comment: Used maximove to transfer back to bed for toileting.  pt used UE to assist minimally upper trunk during fastening and unfastening clips.      ADL: ADL Overall ADL's : Needs assistance/impaired Eating/Feeding: Set up, Sitting Eating/Feeding Details (indicate cue type and reason): sat at EOB, assist to set up tray as pt reliant on 1 hand on bed for sitting balance, increased spillage Grooming: Wash/dry hands, Wash/dry face, Bed level, Set up Upper Body Bathing: Maximal assistance, Bed level Lower Body Bathing: +2 for physical assistance, Total assistance, Bed level Upper Body Dressing : Moderate assistance, Bed level Upper Body Dressing Details (indicate cue type and reason): changed gown Lower Body Dressing: +2 for physical assistance, Total assistance, Bed level Toileting- Clothing Manipulation and Hygiene: +2 for physical assistance, Total assistance, Bed level Toileting - Clothing Manipulation Details (indicate cue type and reason): used bedpan General ADL Comments: Pt total A for pericare and colostomy care. Colstomy leaked while we were helping her to roll to place maxi move pad. Nursing and and we A'd her with getting pt cleaned up and new colostomy bag on.  Cognition: Cognition Overall Cognitive Status: Within Functional Limits for tasks assessed Orientation Level: Oriented X4 Cognition Arousal/Alertness: Awake/alert Behavior During Therapy: WFL for tasks assessed/performed Overall Cognitive Status: Within Functional Limits for tasks assessed  Physical Exam: Blood pressure 99/51, pulse 91, temperature 98.7 F (37.1 C), temperature source Oral, resp. rate 18, height 5' 2" (1.575 m), weight 167.009 kg (368 lb 3 oz), last menstrual period 05/06/2015, SpO2 100 %. Physical Exam  Constitutional: She appears well-developed and well-nourished.  Morbid obesity  HENT:  Head: Normocephalic and  atraumatic.  Eyes: Conjunctivae and EOM are normal.  Neck: Normal range of motion. Neck supple.  Cardiovascular: Normal rate, regular rhythm and normal heart sounds.   Respiratory: Effort normal and breath sounds normal. No respiratory distress.  GI: Soft. Bowel sounds are normal.  Incision with dressing +Colostomy  Musculoskeletal: She exhibits no tenderness.  PROM WNL B/l UE 4/5 LLE hip flexion 3/5, ankle dorsi/plantar flexion 4/5 RLE hip flexion 2+/5, ankle dorsi/plantar flexion 3/5  Neurological: She is alert. No cranial nerve deficit.  Skin: Skin is warm and dry.  Abdominal incision with dressing c/d/i  Psychiatric: Her behavior is normal.  Decreased motivation   Constitutional: She is oriented to person, place, and time.  34 year old African-American morbidly obese female  HENT:  Head: Normocephalic.  Eyes: EOM are normal.  Neck: Normal range of motion. Neck supple. No thyromegaly present.  Cardiovascular: Normal rate and regular rhythm.  Respiratory: Effort normal and breath sounds normal. No respiratory distress.  GI: Soft.  New abdominal dressing applied. Ostomy site clean and dry  skin ischemic tip of right fifth digit.  Neurological: She is alert and oriented to person, place, and time   Results for orders placed or performed during the hospital encounter of 06/03/15 (from the past 48 hour(s))  Glucose, capillary     Status: Abnormal   Collection Time: 06/24/15  4:54 PM  Result Value Ref Range   Glucose-Capillary 133 (H) 65 - 99  mg/dL  CBC     Status: Abnormal   Collection Time: 06/24/15  8:10 PM  Result Value Ref Range   WBC 10.3 4.0 - 10.5 K/uL   RBC 3.86 (L) 3.87 - 5.11 MIL/uL   Hemoglobin 8.5 (L) 12.0 - 15.0 g/dL   HCT 28.7 (L) 36.0 - 46.0 %   MCV 74.4 (L) 78.0 - 100.0 fL   MCH 22.0 (L) 26.0 - 34.0 pg   MCHC 29.6 (L) 30.0 - 36.0 g/dL   RDW 27.4 (H) 11.5 - 15.5 %   Platelets 294 150 - 400 K/uL  Glucose, capillary     Status: Abnormal   Collection  Time: 06/24/15 10:06 PM  Result Value Ref Range   Glucose-Capillary 103 (H) 65 - 99 mg/dL   Comment 1 Notify RN    Comment 2 Document in Chart   Renal function panel     Status: Abnormal   Collection Time: 06/25/15  4:27 AM  Result Value Ref Range   Sodium 134 (L) 135 - 145 mmol/L   Potassium 3.5 3.5 - 5.1 mmol/L   Chloride 103 101 - 111 mmol/L   CO2 27 22 - 32 mmol/L   Glucose, Bld 95 65 - 99 mg/dL   BUN 27 (H) 6 - 20 mg/dL   Creatinine, Ser 1.20 (H) 0.44 - 1.00 mg/dL   Calcium 7.6 (L) 8.9 - 10.3 mg/dL   Phosphorus 3.1 2.5 - 4.6 mg/dL   Albumin 1.6 (L) 3.5 - 5.0 g/dL   GFR calc non Af Amer 58 (L) >60 mL/min   GFR calc Af Amer >60 >60 mL/min    Comment: (NOTE) The eGFR has been calculated using the CKD EPI equation. This calculation has not been validated in all clinical situations. eGFR's persistently <60 mL/min signify possible Chronic Kidney Disease.    Anion gap 4 (L) 5 - 15  Glucose, capillary     Status: None   Collection Time: 06/25/15  7:41 AM  Result Value Ref Range   Glucose-Capillary 97 65 - 99 mg/dL   Comment 1 Notify RN   Glucose, capillary     Status: Abnormal   Collection Time: 06/25/15 11:49 AM  Result Value Ref Range   Glucose-Capillary 105 (H) 65 - 99 mg/dL  Glucose, capillary     Status: None   Collection Time: 06/25/15  5:01 PM  Result Value Ref Range   Glucose-Capillary 93 65 - 99 mg/dL  Glucose, capillary     Status: None   Collection Time: 06/25/15  9:30 PM  Result Value Ref Range   Glucose-Capillary 78 65 - 99 mg/dL   Comment 1 Notify RN   Renal function panel     Status: Abnormal   Collection Time: 06/26/15  5:40 AM  Result Value Ref Range   Sodium 135 135 - 145 mmol/L   Potassium 3.4 (L) 3.5 - 5.1 mmol/L   Chloride 103 101 - 111 mmol/L   CO2 27 22 - 32 mmol/L   Glucose, Bld 87 65 - 99 mg/dL   BUN 23 (H) 6 - 20 mg/dL   Creatinine, Ser 1.09 (H) 0.44 - 1.00 mg/dL   Calcium 7.5 (L) 8.9 - 10.3 mg/dL   Phosphorus 3.1 2.5 - 4.6 mg/dL    Albumin 1.5 (L) 3.5 - 5.0 g/dL   GFR calc non Af Amer >60 >60 mL/min   GFR calc Af Amer >60 >60 mL/min    Comment: (NOTE) The eGFR has been calculated using the CKD EPI equation.  This calculation has not been validated in all clinical situations. eGFR's persistently <60 mL/min signify possible Chronic Kidney Disease.    Anion gap 5 5 - 15  Glucose, capillary     Status: None   Collection Time: 06/26/15  7:39 AM  Result Value Ref Range   Glucose-Capillary 91 65 - 99 mg/dL  Glucose, capillary     Status: None   Collection Time: 06/26/15 12:22 PM  Result Value Ref Range   Glucose-Capillary 90 65 - 99 mg/dL   No results found.     Medical Problem List and Plan: 1. Functional deficits secondary to debilitation/Crohn's disease/perforated left colon with peritonitis/exploratory laparotomy with drainage of intra-abdominal abscess partial colon resection with colostomy. ContinuePentasa 500 mg 4 times a day 2.  DVT Prophylaxis/Anticoagulation: Subcutaneous heparin. Monitor platelet counts of any signs of bleeding 3. Pain Management: Hydrocodone as needed 4. Acute blood loss anemia. Patient transfused follow-up CBC 5. Neuropsych: This patient is capable of making decisions on her own behalf. 6. Skin/Wound Care: Routine skin checks and dressing changes as directed with wet to dry twice a day 7. Fluids/Electrolytes/Nutrition: Strict I&O with follow-up chemistries 8. Acute renal insufficiency 2.64-2.84. Creatinine latest result 1.09. Follow-up chemistries 9. Acute respiratory failure/VDRF. Extubated 06/14/2015. 10. SVT. Monitor heart rate. Lopressor 50 mg twice a day 11. Chronic systolic congestive heart failure. Monitor for any signs of fluid overload. Lasix 40 mg twice a day 12. Constipation. Laxed assistance 13. Morbid obesity. Body mass index 67.33   Post Admission Physician Evaluation: 1. Functional deficits secondary  to debilitation/Crohn's disease/perforated left colon with  peritonitis/exploratory laparotomy with drainage of intra-abdominal abscess partial colon resection with colostomy. 2. Patient is admitted to receive collaborative, interdisciplinary care between the physiatrist, rehab nursing staff, and therapy team. 3. Patient's level of medical complexity and substantial therapy needs in context of that medical necessity cannot be provided at a lesser intensity of care such as a SNF. 4. Patient has experienced substantial functional loss from his/her baseline which was documented above under the "Functional History" and "Functional Status" headings.  Judging by the patient's diagnosis, physical exam, and functional history, the patient has potential for functional progress which will result in measurable gains while on inpatient rehab.  These gains will be of substantial and practical use upon discharge  in facilitating mobility and self-care at the household level. 5. Physiatrist will provide 24 hour management of medical needs as well as oversight of the therapy plan/treatment and provide guidance as appropriate regarding the interaction of the two. 6. 24 hour rehab nursing will assist with bowel management, skin/wound care, disease management, medication administration, pain management and patient education  and help integrate therapy concepts, techniques,education, etc. 7. PT will assess and treat for/with: Lower extremity strength, range of motion, stamina, balance, functional mobility, safety, adaptive techniques and equipment. Goals are: min assist. 8. OT will assess and treat for/with: ADL's, functional mobility, safety, upper extremity strength, adaptive techniques and equipment, NMR, family ed, stroke education. Goals are: supervision to min assist. Therapy may not yet proceed with showering this patient. 9. Case Management and Social Worker will assess and treat for psychological issues and discharge planning. 10. Team conference will be held weekly to  assess progress toward goals and to determine barriers to discharge. 11. Patient will receive at least 3 hours of therapy per day at least 5 days per week. 12. ELOS: 14-17 days. 13. Prognosis:  fair  Delice Lesch, MD  06/26/2015

## 2015-06-19 NOTE — Progress Notes (Signed)
[facility] Progress Note  8 Days Post-Op  Subjective: 34 y/o, obese AA female seen and examined at bedside. She is doing better today. Her pain has decreased from an 8/10 to a 6/10. She denies fever, chills, night sweats, nausea, and vomiting. She reports trying to get out of bed yesterday but was unable to due to pain. She states that she will try to get out of bed and into her chair today. Her midline dressing was last changed today at 6:00 am. Her ostomy bag contains soft brown stool.   Objective: Vital signs in last 24 hours: Temp:  [97.3 F (36.3 C)-98.6 F (37 C)] 98.6 F (37 C) (09/26 0700) Pulse Rate:  [92-150] 110 (09/26 0338) Resp:  [14-22] 16 (09/26 0338) BP: (97-130)/(60-100) 98/73 mmHg (09/26 0338) SpO2:  [90 %-100 %] 100 % (09/26 0338) Last BM Date: 06/18/15  Intake/Output from previous day: 09/25 0701 - 09/26 0700 In: 2383.3 [P.O.:480; I.V.:1610.3; Blood:293] Out: 1400 [[id number]] Intake/Output this shift:   PE: Gen:  Alert, NAD, pleasant Card:  RRR, no M/G/R heard Pulm:  CTA, no W/R/R Abd: Soft, NT/ND, +BS, midline incisions C/D/I, left ostomy bag containing soft, brown stool, still dusky.  Ext:  No erythema, edema, or tenderness, pedal pulses 1-2+ BL   Lab Results:   Recent Labs  06/18/15 0330 06/19/15 0300  WBC 22.7* 27.4*  HGB 7.5* 8.4*  HCT 23.7* 27.6*  PLT 256 412*   BMET  Recent Labs  06/18/15 0330 06/19/15 0300  NA 135 138  K 4.5 4.6  CL 110 111  CO2 19* 21*  GLUCOSE 107* 109*  BUN 46* 45*  CREATININE 2.31* 2.03*  CALCIUM 7.6* 8.1*   PT/INR No results for input(s): LABPROT, INR in the last 72 hours. CMP     Component Value Date/Time   NA 138 06/19/2015 0300   K 4.6 06/19/2015 0300   CL 111 06/19/2015 0300   CO2 21* 06/19/2015 0300   GLUCOSE 109* 06/19/2015 0300   BUN 45* 06/19/2015 0300   CREATININE 2.03* 06/19/2015 0300   CREATININE 0.52 12/29/2014 1557   CALCIUM 8.1* 06/19/2015 0300   PROT 5.8* 06/19/2015  0300   ALBUMIN 1.7* 06/19/2015 0300   AST 15 06/19/2015 0300   ALT 16 06/19/2015 0300   ALKPHOS 101 06/19/2015 0300   BILITOT 1.0 06/19/2015 0300   GFRNONAA 31* 06/19/2015 0300   GFRAA 36* 06/19/2015 0300   Lipase     Component Value Date/Time   LIPASE 10* 06/09/2015 1325       Studies/Results: No results found.  Anti-infectives: Anti-infectives    Start     Dose/Rate Route Frequency Ordered Stop   06/14/15 1800  ciprofloxacin (CIPRO) IVPB 200 mg     200 mg 100 mL/hr over 60 Minutes Intravenous Every 12 hours 06/14/15 1438 06/16/15 1834   06/10/15 1800  ciprofloxacin (CIPRO) IVPB 400 mg  Status:  Discontinued     400 mg 200 mL/hr over 60 Minutes Intravenous Every 12 hours 06/10/15 1734 06/14/15 1438   06/09/15 1927  ciprofloxacin (CIPRO) 400 MG/200ML IVPB    Comments:  Ubaldo Glassing   : cabinet override      06/09/15 1927 06/09/15 1953   06/09/15 1830  ciprofloxacin (CIPRO) IVPB 400 mg  Status:  Discontinued     400 mg 200 mL/hr over 60 Minutes Intravenous  Once 06/09/15 1825 06/10/15 1734   06/09/15 1830  metroNIDAZOLE (FLAGYL) IVPB 500 mg     500 mg 100 mL/hr  over 60 Minutes Intravenous Every 8 hours 06/09/15 1825 06/16/15 1930   06/04/15 1200  metroNIDAZOLE (FLAGYL) IVPB 500 mg  Status:  Discontinued     500 mg 100 mL/hr over 60 Minutes Intravenous Every 8 hours 06/04/15 1034 06/07/15 1405   06/04/15 0015  cefTRIAXone (ROCEPHIN) 1 g in dextrose 5 % 50 mL IVPB     1 g 100 mL/hr over 30 Minutes Intravenous  Once 06/04/15 0012 06/04/15 0108       Assessment/Plan Perforated descending colon, colitis POD 8,10 s/p ex lap with DRAINAGE OF INTRA-ABDOMINAL ABSCESS, PARTIAL COLON RESECTION, re-exploration and creation of colostomy and abdominal closure - IVF, advance from liquids to full liquids, then as tolerated - encourage movement, mobilize as tolerated; if pt able to sit up in her chair today, pt can be transferred from stepdown. Continue IS. - continue BID  dressing changes, wound vac ordered - WBC 27.4 today, up from 22.3 yesterday. Finished post-op 7-day course of cipro/flagyl on 9/23. Final body fluid culture report: no growth to date. WBC elevation potentially due to steroid use. No fevers.   - SCD's and Heparin for DVT prophylaxis  IBD as seen on pathology - mesalamine capsule 1,000 mg PO QID - probiotic - GI recommended weaning of steroids and d/c of flagyl, currently on SOLU-MEDROL 40 mg injection daily. - GI signed off  AKI - SCr 2.03, GFR 36  Morbid Obesity Hyperglycemia Asthma   LOS: 15 days    [name] 06/19/2015, 9:07 AM Pager: [phone]

## 2015-06-19 NOTE — Progress Notes (Signed)
Physical Therapy Treatment Patient Details Name: Dominique [name] MRN: [id number] DOB: [birth year] Today's Date: 06/19/2015    History of Present Illness Adm 9/10 for colitis 9/16 detected perforated descending colon >>laparotomy, drain of abscess, partial colectomy, wound vac; remained on vent post op 9/18 To OR >> wound closed, colostomy created 9/21 Extubated, off pressors    PT Comments    Limited to exercise in the bed due to labile HR.  Pt participated well; she has significant weakness in all 4's.   Follow Up Recommendations  CIR     Equipment Recommendations  Rolling walker with 5" wheels;Other (comment) (bariatric)    Recommendations for Other Services       Precautions / Restrictions Precautions Precautions: Fall;Other (comment) Other Brace/Splint: abd binder when up Restrictions Weight Bearing Restrictions: No    Mobility  Bed Mobility Overal bed mobility: Needs Assistance Bed Mobility: Rolling Rolling: Mod assist         General bed mobility comments: limited to exercise today due to labile HR  Transfers                 General transfer comment: limited to exercises due to labile HR  Ambulation/Gait                 Stairs            Wheelchair Mobility    Modified Rankin (Stroke Patients Only)       Balance                                    Cognition Arousal/Alertness: Awake/alert Behavior During Therapy: Flat affect;WFL for tasks assessed/performed Overall Cognitive Status: Within Functional Limits for tasks assessed                      Exercises General Exercises - Upper Extremity Shoulder Flexion: AROM;AAROM;Both;10 reps;Supine Elbow Flexion: AROM;Both;15 reps;Supine (graded resistance) Elbow Extension: AROM;Both;10 reps;Supine General Exercises - Lower Extremity Ankle Circles/Pumps: AROM;Strengthening;Both;Other reps (comment);Supine (40) Quad Sets: AROM;Both;10 reps;Supine Gluteal Sets:  AROM;Strengthening;Both;10 reps;Supine Heel Slides: AROM;Strengthening;Both;10 reps;Other (comment) (graded resistance flexion and extension) Hip ABduction/ADduction: AROM;AAROM;Both;15 reps;Supine    General Comments        Pertinent Vitals/Pain Pain Assessment: Faces Faces Pain Scale: Hurts little more Pain Location: abd Pain Descriptors / Indicators: Guarding;Grimacing Pain Intervention(s): Monitored during session    Home Living                      Prior Function            PT Goals (current goals can now be found in the care plan section) Acute Rehab PT Goals Patient Stated Goal: return home to 34 yo daughter PT Goal Formulation: With patient Time For Goal Achievement: 06/29/15 Potential to Achieve Goals: Good Progress towards PT goals: Progressing toward goals    Frequency  Min 3X/week    PT Plan Other (comment)    Co-evaluation             End of Session   Activity Tolerance: Patient tolerated treatment well Patient left: in bed;with call bell/phone within reach;with SCD's reapplied     Time: 1550-1613 PT Time Calculation (min) (ACUTE ONLY): 23 min  Charges:  $Therapeutic Exercise: 23-37 mins                    G Codes:  Mottinger, Tessie Fass 06/19/2015, 7:07 PM 06/19/2015  Donnella Sham, [place] [phone]  (pager)

## 2015-06-19 NOTE — Consult Note (Addendum)
WOC wound follow up Wound type: Full thickness midline abd wound followed by CCS for plan of care. PA at bedside to assess wound and requested that Vac dressing be applied. Measurement: 27X8X8cm Wound bed: 85% beefy red, 15% yellow Drainage (amount, consistency, odor) Mod amt yellow drainage, no odor Periwound: Intact skin surrounding Dressing procedure/placement/frequency: Applied one piece black foam to cont suction at 147m.  Pt tolerated with mod amt discomfort after pain meds given earlier.  Plan for bedside nurse to change Q M/W/F.  Ostomy pouch intact with good seal and mod amt brown semi-formed stool. Was changed by bedside nurse on 9/22.  Will perform further teaching sessions when pt is stable and out of ICU. DJulien GirtMSN, RN, CNorth [place]

## 2015-06-19 NOTE — Progress Notes (Signed)
Nutrition Follow-up  DOCUMENTATION CODES:   Severe malnutrition in context of acute illness/injury, Morbid obesity  INTERVENTION:   Ensure Enlive po BID, each supplement provides 350 kcal and 20 grams of protein  Prostat liquid protein po 30 ml BID with meals, each supplement provides 100 kcal, 15 grams protein  NUTRITION DIAGNOSIS:   Increased nutrient needs related to wound healing as evidenced by estimated needs, ongoing  GOAL:   Patient will meet greater than or equal to 90% of their needs, progressing  MONITOR:   PO intake, Supplement acceptance, Labs, Weight trends, Skin, I & O's  ASSESSMENT:   Patient is a 34 year old female with history of asthma and 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 Pt with recurrent colitis.   Pt with Crohn's disease found to be free air.   Pt s/p procedures 9/16: EXPLORATORY LAPAROTOMY DRAINAGE OF INTRA-ABDOMINAL ABSCESS PARTIAL COLON RESECTION APPLICATION OF WOUND VAC  Patient extubated 9/21.    Advanced to Full Liquid diet, now on Soft.  Pt receiving wound care upon RD visit.  Nutrient needs increased given wound/post-op healing.  RD to add oral nutrition supplements and liquid protein.  Diet Order:  Diet full liquid Room service appropriate?: Yes; Fluid consistency:: Thin DIET SOFT Room service appropriate?: Yes; Fluid consistency:: Thin  Skin:  Wound (see comment) (Open abdomen wound VAC)  Last BM:  9/26  Height:   Ht Readings from Last 1 Encounters:  06/03/15 5' 2"  (1.575 m)    Weight:   Wt Readings from Last 1 Encounters:  06/03/15 368 lb 3 oz (167.009 kg)    CBG (last 3)   Recent Labs  06/18/15 2330 06/19/15 0337 06/19/15 0739  GLUCAP 105* 106* 107*    Ideal Body Weight:  50 kg  BMI:  Body mass index is 67.33 kg/(m^2).  Estimated Nutritional Needs:   Kcal:  2200-2400  Protein:  120-130 gm  Fluid:  2.2-2.4 L  EDUCATION NEEDS:   Education needs no  appropriate at this time  Arthur Holms, RD, LDN Pager #: [phone] After-Hours Pager #: [phone]

## 2015-06-20 DIAGNOSIS — I429 Cardiomyopathy, unspecified: Secondary | ICD-10-CM

## 2015-06-20 DIAGNOSIS — I5022 Chronic systolic (congestive) heart failure: Secondary | ICD-10-CM | POA: Insufficient documentation

## 2015-06-20 DIAGNOSIS — K50919 Crohn's disease, unspecified, with unspecified complications: Secondary | ICD-10-CM

## 2015-06-20 DIAGNOSIS — I519 Heart disease, unspecified: Secondary | ICD-10-CM

## 2015-06-20 DIAGNOSIS — R1084 Generalized abdominal pain: Secondary | ICD-10-CM

## 2015-06-20 LAB — CBC
HCT: 26.3 % — ABNORMAL LOW (ref 36.0–46.0)
Hemoglobin: 7.8 g/dL — ABNORMAL LOW (ref 12.0–15.0)
MCH: 21.4 pg — ABNORMAL LOW (ref 26.0–34.0)
MCHC: 29.7 g/dL — ABNORMAL LOW (ref 30.0–36.0)
MCV: 72.1 fL — ABNORMAL LOW (ref 78.0–100.0)
PLATELETS: 396 10*3/uL (ref 150–400)
RBC: 3.65 MIL/uL — AB (ref 3.87–5.11)
RDW: 25.7 % — AB (ref 11.5–15.5)
WBC: 20.2 10*3/uL — AB (ref 4.0–10.5)

## 2015-06-20 LAB — GLUCOSE, CAPILLARY
Glucose-Capillary: 119 mg/dL — ABNORMAL HIGH (ref 65–99)
Glucose-Capillary: 108 mg/dL — ABNORMAL HIGH (ref 65–99)
Glucose-Capillary: 110 mg/dL — ABNORMAL HIGH (ref 65–99)
Glucose-Capillary: 126 mg/dL — ABNORMAL HIGH (ref 65–99)
Glucose-Capillary: 92 mg/dL (ref 65–99)
Glucose-Capillary: 98 mg/dL (ref 65–99)
Glucose-Capillary: 96 mg/dL (ref 65–99)

## 2015-06-20 LAB — COMPREHENSIVE METABOLIC PANEL
ALK PHOS: 81 U/L (ref 38–126)
ALT: 16 U/L (ref 14–54)
ANION GAP: 6 (ref 5–15)
AST: 17 U/L (ref 15–41)
Albumin: 1.7 g/dL — ABNORMAL LOW (ref 3.5–5.0)
BILIRUBIN TOTAL: 0.8 mg/dL (ref 0.3–1.2)
BUN: 44 mg/dL — ABNORMAL HIGH (ref 6–20)
CALCIUM: 8 mg/dL — AB (ref 8.9–10.3)
CO2: 21 mmol/L — ABNORMAL LOW (ref 22–32)
CREATININE: 1.83 mg/dL — AB (ref 0.44–1.00)
Chloride: 107 mmol/L (ref 101–111)
GFR calc non Af Amer: 35 mL/min — ABNORMAL LOW (ref >60)
GFR, EST AFRICAN AMERICAN: 40 mL/min — AB (ref >60)
Glucose, Bld: 112 mg/dL — ABNORMAL HIGH (ref 65–99)
Potassium: 5 mmol/L (ref 3.5–5.1)
Sodium: 134 mmol/L — ABNORMAL LOW (ref 135–145)
TOTAL PROTEIN: 5.6 g/dL — AB (ref 6.5–8.1)

## 2015-06-20 LAB — CORTISOL: Cortisol, Plasma: 3.9 ug/dL

## 2015-06-20 LAB — MAGNESIUM: MAGNESIUM: 2.1 mg/dL (ref 1.7–2.4)

## 2015-06-20 MED ORDER — METOPROLOL TARTRATE 12.5 MG HALF TABLET
37.5000 mg | ORAL_TABLET | Freq: Two times a day (BID) | ORAL | Status: DC
Start: 2015-06-20 — End: 2015-06-21
  Administered 2015-06-20: 37.5 mg via ORAL
  Filled 2015-06-20 (×3): qty 1

## 2015-06-20 MED ORDER — FUROSEMIDE 10 MG/ML IJ SOLN
40.0000 mg | Freq: Every day | INTRAMUSCULAR | Status: DC
  Administered 2015-06-20 – 2015-06-21 (×2): 40 mg via INTRAVENOUS
  Filled 2015-06-20 (×3): qty 4

## 2015-06-20 MED ORDER — HYDRALAZINE HCL 25 MG PO TABS
12.5000 mg | ORAL_TABLET | Freq: Three times a day (TID) | ORAL | Status: DC
  Administered 2015-06-20: 12.5 mg via ORAL
  Filled 2015-06-20 (×6): qty 0.5

## 2015-06-20 MED ORDER — ISOSORBIDE MONONITRATE ER 30 MG PO TB24
30.0000 mg | ORAL_TABLET | Freq: Every day | ORAL | Status: DC
  Filled 2015-06-20 (×2): qty 1

## 2015-06-20 MED ORDER — CETYLPYRIDINIUM CHLORIDE 0.05 % MT LIQD
7.0000 mL | Freq: Two times a day (BID) | OROMUCOSAL | Status: DC
  Administered 2015-06-20 – 2015-06-23 (×8): 7 mL via OROMUCOSAL

## 2015-06-20 NOTE — Progress Notes (Signed)
Reason for Consult:   New LVD  Requesting Physician: Dr Sherral Hammers Primary Cardiologist new  HPI:  34 y/o AA female with a history of super morbid obesity and prior acute respiratory failure Oct 2011 after a C-section. Her BMI has been as high as 85. An echo done during that Oct 2011 adm showed her EF to be 60-65% and her respiratory failure then was attributed to hypoventilation. She has recently lost some wgt- BMI down to 67.5. She was admitted 06/04/15 with abdominal pain and diagnosed with Chron's colitis. She had partial colectomy and exploratory lap done 06/09/15. This was complicated by peritonitis and sepsis. She was operated on again 06/11/15. She has had a slow recovery. She had acute renal insuffiencey with a peak SCr of 2.84. She has had respiratory failure and encephalopathy. We are asked to see her now after an echo done 06/18/15 showed an EF of 20% with global HK. She also had a slight Troponin elevation 0.11, and has "new" inferior Qs and lateral TWI on EKG. She denies chest pain. She was brought to a sitting position on the side of the bed this pm by PT.     PMHx:  Past Medical History  Diagnosis Date  . Asthma   . Sleep apnea   . Anemia   . Morbid obesity 03/22/2009    Qualifier: Diagnosis of  By: Ronnald Ramp CMA, [place]    . OSA (obstructive sleep apnea) 05/02/2014  . Thyromegaly 05/02/2014  . Seasonal allergies 06/13/2014  . Acute acalculous cholecystitis 11/16/2014  . Diverticulitis of colon 11/17/2014  . Hepatic steatosis 11/17/2014    Past Surgical History  Procedure Laterality Date  . C section 2011  2011  . Flexible sigmoidoscopy N/A 06/05/2015    Procedure: FLEXIBLE SIGMOIDOSCOPY;  Surgeon: Ronald Lobo, MD;  Location: [facility];  Service: Endoscopy;  Laterality: N/A;  . Laparotomy N/A 06/09/2015    Procedure: EXPLORATORY LAPAROTOMY, DRAINAGE OF INTRAABDOMINAL ABSCESS, PARTIAL COLON RESECTION,  APPLICATION OF WOUND VAC;  Surgeon: Georganna Skeans, MD;  Location:  [facility];  Service: General;  Laterality: N/A;  . Laparotomy N/A 06/11/2015    Procedure: RE-EXPLORATION OF ABDOMEN;  Surgeon: Georganna Skeans, MD;  Location: [facility];  Service: General;  Laterality: N/A;  . Colostomy N/A 06/11/2015    Procedure:  CREATION OF COLOSTOMY;  Surgeon: Georganna Skeans, MD;  Location: [facility];  Service: General;  Laterality: N/A;    SOCHx:  reports that she has quit smoking. Her smoking use included Cigarettes. She started smoking about 10 months ago. She has a 2 pack-year smoking history. She has never used smokeless tobacco. She reports that she does not drink alcohol or use illicit drugs.  FAMHx: Family History  Problem Relation Age of Onset  . Hypertension Mother   . Hypertension Father   . Hypertension Sister   . Asthma Brother   . Diabetes Maternal Grandmother   . Hypertension Maternal Grandmother   . Hypertension Maternal Grandfather   . Cancer Paternal Grandmother     lung  . Emphysema Paternal Grandfather   . Allergies Mother     ALLERGIES: Allergies  Allergen Reactions  . Other Shortness Of Breath and Swelling    Tree nuts  . Penicillins     Unknown childhood allergy    ROS: Review of Systems: General: negative for chills, fever, night sweats or weight changes.  Cardiovascular: negative for chest pain, dyspnea on exertion, edema, orthopnea, palpitations, paroxysmal nocturnal dyspnea or shortness  of breath HEENT: negative for any visual disturbances, blindness, glaucoma Dermatological: negative for rash Respiratory: negative for cough, hemoptysis, or wheezing Urologic: negative for hematuria or dysuria Abdominal: negative for nausea, vomiting, diarrhea, bright red blood per rectum, melena, or hematemesis, sore post op Neurologic: negative for visual changes, syncope, or dizziness Musculoskeletal: negative for back pain, joint pain, or swelling Psych: cooperative and appropriate All other systems reviewed and are otherwise negative except as  noted above.   HOME MEDICATIONS: Prior to Admission medications   Medication Sig Start Date End Date Taking? Authorizing Provider  ferrous sulfate 325 (65 FE) MG tablet Take 2 tablets (650 mg total) by mouth daily with breakfast. 04/07/15  Yes Midge Minium, MD  ondansetron (ZOFRAN) 4 MG tablet Take 1 tablet (4 mg total) by mouth every 6 (six) hours. 05/25/15  Yes Heather Laisure, PA-C  albuterol (PROVENTIL) (2.5 MG/3ML) 0.083% nebulizer solution Take 3 mLs (2.5 mg total) by nebulization every 6 (six) hours as needed for wheezing or shortness of breath. Patient not taking: Reported on 06/03/2015 10/27/14   Rigoberto Noel, MD  PROAIR HFA 108 (90 BASE) MCG/ACT inhaler INHALE 2 PUFFS BY MOUTH INTO THE LUNGS EVERY 6 HOURS AS NEEDED FOR WHEEZING OR SHORTNESS OF BREATH 02/17/15   Midge Minium, MD    HOSPITAL MEDICATIONS: I have reviewed the patient's current medications.  VITALS: Blood pressure 121/73, pulse 107, temperature 98.1 F (36.7 C), temperature source Oral, resp. rate 17, height 5' 2"  (1.575 m), weight 368 lb 3 oz (167.009 kg), last menstrual period 05/06/2015, SpO2 100 %.  PHYSICAL EXAM: General appearance: cooperative, no distress and morbidly obese Neck: impossible to tell if she has JVD Lungs: clear to auscultation bilaterally Heart: regular rate and rhythm Abdomen: morbid obesity Extremities: trace edema at ankles Pulses: 2+ and symmetric Skin: sacral decubitus Neurologic: Grossly normal  LABS: Results for orders placed or performed during the hospital encounter of 06/03/15 (from the past 24 hour(s))  Glucose, capillary     Status: Abnormal   Collection Time: 06/19/15  5:07 PM  Result Value Ref Range   Glucose-Capillary 144 (H) 65 - 99 mg/dL   Comment 1 Notify RN    Comment 2 Document in Chart   Glucose, capillary     Status: Abnormal   Collection Time: 06/19/15  8:25 PM  Result Value Ref Range   Glucose-Capillary 122 (H) 65 - 99 mg/dL  Glucose, capillary      Status: Abnormal   Collection Time: 06/19/15 11:15 PM  Result Value Ref Range   Glucose-Capillary 119 (H) 65 - 99 mg/dL  Glucose, capillary     Status: Abnormal   Collection Time: 06/20/15  3:10 AM  Result Value Ref Range   Glucose-Capillary 108 (H) 65 - 99 mg/dL  Comprehensive metabolic panel     Status: Abnormal   Collection Time: 06/20/15  4:51 AM  Result Value Ref Range   Sodium 134 (L) 135 - 145 mmol/L   Potassium 5.0 3.5 - 5.1 mmol/L   Chloride 107 101 - 111 mmol/L   CO2 21 (L) 22 - 32 mmol/L   Glucose, Bld 112 (H) 65 - 99 mg/dL   BUN 44 (H) 6 - 20 mg/dL   Creatinine, Ser 1.83 (H) 0.44 - 1.00 mg/dL   Calcium 8.0 (L) 8.9 - 10.3 mg/dL   Total Protein 5.6 (L) 6.5 - 8.1 g/dL   Albumin 1.7 (L) 3.5 - 5.0 g/dL   AST 17 15 - 41 U/L  ALT 16 14 - 54 U/L   Alkaline Phosphatase 81 38 - 126 U/L   Total Bilirubin 0.8 0.3 - 1.2 mg/dL   GFR calc non Af Amer 35 (L) >60 mL/min   GFR calc Af Amer 40 (L) >60 mL/min   Anion gap 6 5 - 15  Cortisol     Status: None   Collection Time: 06/20/15  4:51 AM  Result Value Ref Range   Cortisol, Plasma 3.9 ug/dL  CBC     Status: Abnormal   Collection Time: 06/20/15  4:51 AM  Result Value Ref Range   WBC 20.2 (H) 4.0 - 10.5 K/uL   RBC 3.65 (L) 3.87 - 5.11 MIL/uL   Hemoglobin 7.8 (L) 12.0 - 15.0 g/dL   HCT 26.3 (L) 36.0 - 46.0 %   MCV 72.1 (L) 78.0 - 100.0 fL   MCH 21.4 (L) 26.0 - 34.0 pg   MCHC 29.7 (L) 30.0 - 36.0 g/dL   RDW 25.7 (H) 11.5 - 15.5 %   Platelets 396 150 - 400 K/uL  Glucose, capillary     Status: Abnormal   Collection Time: 06/20/15  7:49 AM  Result Value Ref Range   Glucose-Capillary 110 (H) 65 - 99 mg/dL   Comment 1 Notify RN    Comment 2 Document in Chart   Magnesium     Status: None   Collection Time: 06/20/15  9:48 AM  Result Value Ref Range   Magnesium 2.1 1.7 - 2.4 mg/dL    EKG: NSR, ST inferior Qs, lateral TWI    IMPRESSION: Principal Problem:   Colitis Active Problems:   Morbid obesity- BMI 67.5    Perforated sigmoid colon   Peritonitis   Peritoneal abscess   Cardiomyopathy- etiology not yet determined   Acute respiratory failure with hypoxia   Crohn's disease   OSA on CPAP   Obesity hypoventilation syndrome   Acute renal insufficiency   Iron deficiency anemia   Pressure ulcer   Acute encephalopathy   Pulmonary hypertension   RECOMMENDATION: Medical Rx for suspected cardiomyopathy secondary to sepsis and acute illness. If Scr continues to improve challenge with ACE, in the meantime add Hydralazine and nitrates. Volume status currently seems stable, MD to see.    Time Spent Directly with Patient: 45 minutes  Erlene Quan [phone] beeper 06/20/2015, 2:54 PM   Patient seen and examined with Kerin Ransom, PA-C. We discussed all aspects of the encounter. I agree with the assessment and plan as stated above.   Echo images reviewed personally. Suspect she has critical illness cardiomyopathy which hopefully should resolve fairly quickly. Volume status looks good. Renal function improving. Continue lasix, b-blocker. Add hydralazine/nitrates. Consider ACE/ARB when renal function improves.   Bensimhon, Daniel,MD 3:44 PM

## 2015-06-20 NOTE — Progress Notes (Signed)
[facility] Progress Note  9 Days Post-Op  Subjective: Pt seen and examined at bedside. She had a wound vac placed yesterday and reports she is tolerating it well. Her pain is well controlled and when she experiences pain she rates it at a 6/10. Denies fever, chills, night sweats, nausea, vomiting. Did not get out of bed yesterday, but PT came by and did bed exercises with her for 25 minutes.  Tolerating diet, not eating much for fear of nausea. Ostomy bag  and wound vac in place.  Objective: Vital signs in last 24 hours: Temp:  [98 F (36.7 C)-98.7 F (37.1 C)] 98 F (36.7 C) (09/27 0311) Pulse Rate:  [99-138] 107 (09/27 0311) Resp:  [11-26] 14 (09/27 0311) BP: (102-120)/(71-86) 102/71 mmHg (09/27 0311) SpO2:  [96 %-100 %] 97 % (09/27 0311) Last BM Date: 06/19/15  Intake/Output from previous day: 09/26 0701 - 09/27 0700 In: 1567.7 [P.O.:1200; I.V.:367.7] Out: 3329 [Urine:1050; Drains:145; Stool:180] Intake/Output this shift:   PE: Gen:  Alert, NAD, pleasant Card:  RRR, no M/G/R heard Pulm:  CTA, no W/R/R Abd: Soft, NT/ND, +BS, no HSM, incisions C/D/I, vac drain with 250 mL serosanguinous drainage. left ostomy bag containing soft, brown stool and minimal sanguinous drainage. Ext:  No erythema, edema, or tenderness.  Lab Results:   Recent Labs  06/19/15 0300 06/20/15 0451  WBC 27.4* 20.2*  HGB 8.4* 7.8*  HCT 27.6* 26.3*  PLT 412* 396   BMET  Recent Labs  06/19/15 0300 06/20/15 0451  NA 138 134*  K 4.6 5.0  CL 111 107  CO2 21* 21*  GLUCOSE 109* 112*  BUN 45* 44*  CREATININE 2.03* 1.83*  CALCIUM 8.1* 8.0*   PT/INR No results for input(s): LABPROT, INR in the last 72 hours. CMP     Component Value Date/Time   NA 134* 06/20/2015 0451   K 5.0 06/20/2015 0451   CL 107 06/20/2015 0451   CO2 21* 06/20/2015 0451   GLUCOSE 112* 06/20/2015 0451   BUN 44* 06/20/2015 0451   CREATININE 1.83* 06/20/2015 0451   CREATININE 0.52 12/29/2014 1557   CALCIUM 8.0* 06/20/2015 0451   PROT 5.6* 06/20/2015 0451   ALBUMIN 1.7* 06/20/2015 0451   AST 17 06/20/2015 0451   ALT 16 06/20/2015 0451   ALKPHOS 81 06/20/2015 0451   BILITOT 0.8 06/20/2015 0451   GFRNONAA 35* 06/20/2015 0451   GFRAA 40* 06/20/2015 0451   Lipase     Component Value Date/Time   LIPASE 10* 06/09/2015 1325     Studies/Results: No results found.  Anti-infectives: Anti-infectives    Start     Dose/Rate Route Frequency Ordered Stop   06/14/15 1800  ciprofloxacin (CIPRO) IVPB 200 mg     200 mg 100 mL/hr over 60 Minutes Intravenous Every 12 hours 06/14/15 1438 06/16/15 1834   06/10/15 1800  ciprofloxacin (CIPRO) IVPB 400 mg  Status:  Discontinued     400 mg 200 mL/hr over 60 Minutes Intravenous Every 12 hours 06/10/15 1734 06/14/15 1438   06/09/15 1927  ciprofloxacin (CIPRO) 400 MG/200ML IVPB    Comments:  Ubaldo Glassing   : cabinet override      06/09/15 1927 06/09/15 1953   06/09/15 1830  ciprofloxacin (CIPRO) IVPB 400 mg  Status:  Discontinued     400 mg 200 mL/hr over 60 Minutes Intravenous  Once 06/09/15 1825 06/10/15 1734   06/09/15 1830  metroNIDAZOLE (FLAGYL) IVPB 500 mg     500 mg 100 mL/hr over  60 Minutes Intravenous Every 8 hours 06/09/15 1825 06/16/15 1930   06/04/15 1200  metroNIDAZOLE (FLAGYL) IVPB 500 mg  Status:  Discontinued     500 mg 100 mL/hr over 60 Minutes Intravenous Every 8 hours 06/04/15 1034 06/07/15 1405   06/04/15 0015  cefTRIAXone (ROCEPHIN) 1 g in dextrose 5 % 50 mL IVPB     1 g 100 mL/hr over 30 Minutes Intravenous  Once 06/04/15 0012 06/04/15 0108       Assessment/Plan  Perforated descending colon, colitis POD 9,11s/p ex lap with DRAINAGE OF INTRA-ABDOMINAL ABSCESS, PARTIAL COLON RESECTION, re-exploration and creation of colostomy and abdominal closure - IVF, encourage PO intake and advance diet as tolerated, soft diet ordered. - transfer from stepdown.  - encourage movement, mobilize as tolerated; pt has been in  persitent sinus tachycardia, likely secondary to CHF, and PT reported episodes of drop in HR with exercise. continue close monitoring of HR with PT/exercise. - wound vac in place, dressing change tomorrow 9/28 - WBC 20.4 today, down from 27.4 yest. Finished post-op 7-day course of cipro/flagyl on 9/23. Final body fluid culture report: no growth to date. WBC elevation potentially due to steroid use. No fevers.  - SCD's and Heparin for DVT prophylaxis  IBD as seen on pathology - mesalamine capsule 1,000 mg PO QID - probiotic - GI recommended weaning of steroids and d/c of flagyl, currently on SOLU-MEDROL 40 mg injection daily. - GI signed off  AKI - resolving slowly - SCr 1.83, GFR 40   LOS: 16 days    Dominique [name] 06/20/2015, 7:25 AM Pager: [phone]

## 2015-06-20 NOTE — Progress Notes (Signed)
[facility] TEAM 1 - Stepdown/ICU TEAM Progress Note  Dominique [name] ZCH:[id number] DOB: 01-24-1981 DOA: 06/03/2015 PCP: Benito Mccreedy, MD  Admit HPI / Brief Narrative: 34 year old BF PMHx Asthma, OSA/OHS, Hepatic Steatosis and Recurrent Colitis since jan 2016  Came to the ED with cc of severe abdominal pain. She said the pain started back in Jan and improved only between Feb and May. Since June she continued to have abdominal pain with diarrhea and occasional vomiting. She has not been able to see GI for insurance and referral issues until last week. She said after discussing her last CT abdomen findings with GI, the plan for her was to continue a course of abx and come back but last Friday she started having severe abdominal pain that got more diffuse ( usually she has lower abdominal pain) associated with vomiting daily for 2-3 times ( non bilious and non bloody) and daily diarrhea ( soft stool with occasional blood0. She denied fever or chills. No other complaints. No chest pain , cough or dyspnea  HPI/Subjective: 9/27 A/O 4, negative CP, positive SOB, negative N/V, states feels better than on 9/25 .   Assessment/Plan: Acute /recurrent colitis:  -Patient S/P sigmoidoscopy with new diagnosis of Crohn's disease  Crohn's disease -Per biopsies from 9/12 flexible sigmoidoscopy consistent with Crohn's disease. -Continue mesalamine 1000 mg QID  Sepsis unspecified organism/Perforated left colon/Peritonitis/Peritoneal abscess -9/16 patient diagnosed with perforated left colon from Crohn's colitis -Emergent exploratory laparoscopy -See Crohn's disease -Strict I&O since admission; +33.8 L -Watch for fluid overload -Echocardiogram; CHF and pulmonary hypertension see report below -Out of bed to chair per shift  Chronic systolic CHF -EF= 28% -7/86 EKG with EKG from 06/12/2015 no significant change. Sinus tachycardia with criteria meeting inferior infarct undetermined age. -Transfuse for  hemoglobin<8 -9/25 Transfuse one unit PRBC -9/27 CVP= 16; patient volume overloaded -HR better controlled will gently begin diuresis Lasix 40 mg daily -Cardiology consult await further recommendations  Pulmonary hypertension -Patient's BP will not tolerate a vasal dilator.  Asthma:  -Albuterol nebulizer PRN -Stable  OSA/OHS -CPAP/BIPAP per respiratory -If patient declined secondary to refractory nausea please use Thorazine  Acute respiratory failure with hypoxia/VDRF -Resolved  Anemia of critical illness  -Monitor closely  Acute renal failure -Gentle hydration.   Acute encephalopathy -Resolved    Code Status: FULL Family Communication: family present at time of exam Disposition Plan: SNF vs CIR    Consultants: Dr.Robert Buccini [facility]) Dr.Burke Grandville Silos (Trauma surgery) Dr.Aaron Ancil [facility]) Dr Shaune Pascal Bensimhon (cardiology).   Procedure/Significant Events: 9/12 FLEXIBLE SIGMOIDOSCOPY ;Severe chronic inflammatory changes in the proximal sigmoid and descending colon 9/16 Emergent exploratory laparoscopy; perforated descending colon, colitis descending/sigmoid colon 9/18 Exploratory Laparoscopy, reexploration of abdomen with closure and mobilization of splenic flexure; creation of colostomy 9/25 echocardiogram;Left ventricle: moderately dilated. -mild LVH. -LVEF=  20%. Diffuse hypokinesis.  - Pulmonary arteries: PA peak pressure: 40 mm Hg (S) + RAP. - Pericardium, A trivial pericardial effusion  Culture Urine 9/10 >> negative C diff 9/13 >> negative 9/16 Peritoneal fluid negative   Antibiotics: Ceftriaxone x1 >> 9/10 Flagyl 9/11 >> 9/13 Flagyl 9/16 >> stopped 9/23 Cipro 9/16 >> stopped 9/23  - Per notes has been on cipro + flagyl as an outpt as well   DVT prophylaxis: Heparin   Devices    LINES / TUBES:  ETT 06/09/15 Art Line, 06/09/15 R IJ CVC 06/09/15 NGT 06/09/15 Foley 06/09/15     Continuous Infusions: . sodium chloride 10 mL/hr  at 06/20/15  0700    Objective: VITAL SIGNS: Temp: 98.1 F (36.7 C) (09/27 1147) Temp Source: Oral (09/27 1147) BP: 128/89 mmHg (09/27 1743) Pulse Rate: 103 (09/27 1531) SPO2; FIO2:   Intake/Output Summary (Last 24 hours) at 06/20/15 1853 Last data filed at 06/20/15 1530  Gross per 24 hour  Intake 1457.67 ml  Output   2205 ml  Net -747.33 ml     Exam: General: A/O 4, morbidly obese, No acute respiratory distress Eyes: Negative headache, negative scleral hemorrhage ENT: Negative Runny nose, negative ear pain, negative gingival bleeding, Neck:  Negative scars, masses, torticollis, lymphadenopathy, JVD Lungs: Clear to auscultation bilaterally without wheezes or crackles, bronchial rhonchus sounds (patient unable to clear sputum) Cardiovascular: Tachycardic (improved from 9/25), Regular rhythm without murmur gallop or rub normal S1 and S2 Abdomen: Positive Abdominal pain (appropriate for recent surgery), wound VAC in place, morbidly obese, positive soft, bowel sounds, midline vertical incision covered and clean, good granulation tissue, negative sign of infection, ostomy bag in place LLQ negative sign of blood, negative leakage. no rebound, no ascites, no appreciable mass Extremities: No significant cyanosis, clubbing, or edema bilateral lower extremities Psychiatric:  Negative depression, negative anxiety, negative fatigue, negative mania Neurologic:  Cranial nerves II through XII intact, tongue/uvula midline, all extremities muscle strength 5/5, sensation intact throughout, negative dysarthria, negative expressive aphasia, negative receptive aphasia.   Data Reviewed: Basic Metabolic Panel:  Recent Labs Lab 06/15/15 0430 06/16/15 0350 06/17/15 0500 06/17/15 0814 06/18/15 0330 06/19/15 0300 06/20/15 0451 06/20/15 0948  NA 130* 132* 135  --  135 138 134*  --   K 4.9 4.7 4.4  --  4.5 4.6 5.0  --   CL 103 105 105  --  110 111 107  --   CO2 21* 21* 21*  --  19* 21* 21*  --    GLUCOSE 116* 99 98  --  107* 109* 112*  --   BUN 37* 45* 49*  --  46* 45* 44*  --   CREATININE 2.84* 2.75* 2.60*  --  2.31* 2.03* 1.83*  --   CALCIUM 7.0* 7.2* 7.5*  --  7.6* 8.1* 8.0*  --   MG  --   --   --  1.8 1.9 2.0  --  2.1  PHOS 6.6* 5.9*  --  4.7* 4.5 4.1  --   --    Liver Function Tests:  Recent Labs Lab 06/15/15 0430 06/16/15 0350 06/18/15 0330 06/19/15 0300 06/20/15 0451  AST  --   --  13* 15 17  ALT  --   --  16 16 16   ALKPHOS  --   --  107 101 81  BILITOT  --   --  1.2 1.0 0.8  PROT  --   --  5.6* 5.8* 5.6*  ALBUMIN 1.2* 1.3* 1.5* 1.7* 1.7*   No results for input(s): LIPASE, AMYLASE in the last 168 hours. No results for input(s): AMMONIA in the last 168 hours. CBC:  Recent Labs Lab 06/16/15 0350 06/16/15 1814 06/17/15 0500 06/18/15 0330 06/19/15 0300 06/20/15 0451  WBC 23.8*  --  24.5* 22.7* 27.4* 20.2*  NEUTROABS  --   --   --  20.4* 25.5*  --   HGB 7.0* 6.6* 7.8* 7.5* 8.4* 7.8*  HCT 22.0* 21.1* 23.9* 23.7* 27.6* 26.3*  MCV 59.9*  --  62.2* 64.1* 69.2* 72.1*  PLT 196  --  241 256 412* 396   Cardiac Enzymes:  Recent Labs Lab 06/17/15 1508 06/17/15  2000 06/18/15 1403 06/18/15 2015 06/19/15 0300  TROPONINI 0.03 0.09* 0.11* 0.10* 0.10*   BNP (last 3 results) No results for input(s): BNP in the last 8760 hours.  ProBNP (last 3 results) No results for input(s): PROBNP in the last 8760 hours.  CBG:  Recent Labs Lab 06/20/15 0310 06/20/15 0749 06/20/15 1142 06/20/15 1543 06/20/15 1632  GLUCAP 108* 110* 126* 92 98    Recent Results (from the past 240 hour(s))  MRSA PCR Screening     Status: None   Collection Time: 06/13/15 11:15 AM  Result Value Ref Range Status   MRSA by PCR NEGATIVE NEGATIVE Final    Comment:        The GeneXpert MRSA Assay (FDA approved for NASAL specimens only), is one component of a comprehensive MRSA colonization surveillance program. It is not intended to diagnose MRSA infection nor to guide or monitor  treatment for MRSA infections.   Culture, Urine     Status: None   Collection Time: 06/18/15  3:30 AM  Result Value Ref Range Status   Specimen Description URINE, CATHETERIZED  Final   Special Requests NONE  Final   Culture >=100,000 COLONIES/mL LACTOBACILLUS SPECIES  Final   Report Status 06/19/2015 FINAL  Final     Studies:  Recent x-ray studies have been reviewed in detail by the Attending Physician  Scheduled Meds:  Scheduled Meds: . antiseptic oral rinse  7 mL Mouth Rinse BID  . feeding supplement (ENSURE ENLIVE)  237 mL Oral BID BM  . feeding supplement (PRO-STAT SUGAR FREE 64)  30 mL Oral BID  . furosemide  40 mg Intravenous Daily  . heparin subcutaneous  5,000 Units Subcutaneous 3 times per day  . hydrALAZINE  12.5 mg Oral 3 times per day  . Influenza vac split quadrivalent PF  0.5 mL Intramuscular Tomorrow-1000  . insulin aspart  0-20 Units Subcutaneous 6 times per day  . isosorbide mononitrate  30 mg Oral Daily  . mesalamine  500 mg Oral QID  . metoCLOPramide  5 mg Oral TID AC  . metoprolol tartrate  37.5 mg Oral BID  . pantoprazole  40 mg Oral Daily  . pneumococcal 13-valent conjugate vaccine  0.5 mL Intramuscular Tomorrow-1000  . saccharomyces boulardii  250 mg Oral BID    Time spent on care of this patient: 40 mins   WOODS, Geraldo Docker , MD  [facility] Office  [phone] Pager - [phone]  On-Call/Text Page:      [email or web address]      password [email or web address]  If 7PM-7AM, please contact night-coverage [email or web address] Password [facility] 06/20/2015, 6:53 PM   LOS: 16 days   Care during the described time interval was provided by me .  I have reviewed this patient's available data, including medical history, events of note, physical examination, and all test results as part of my evaluation. I have personally reviewed and interpreted all radiology studies.   Dia Crawford, MD [phone] Pager

## 2015-06-20 NOTE — Progress Notes (Signed)
[facility] Progress Note 9 Days Post-Op  Subjective: Patient afraid to eat because she thinks that her pain will worsen.  Objective: Vital signs in last 24 hours: Temp:  [98 F (36.7 C)-98.7 F (37.1 C)] 98.6 F (37 C) (09/27 0747) Pulse Rate:  [99-138] 100 (09/27 0800) Resp:  [11-26] 13 (09/27 0800) BP: (102-125)/(71-87) 125/87 mmHg (09/27 0800) SpO2:  [97 %-100 %] 100 % (09/27 0800) Last BM Date: 06/19/15  Intake/Output from previous day: 09/26 0701 - 09/27 0700 In: 1667.7 [P.O.:1200; I.V.:467.7] Out: 1375 [Urine:1050; Drains:145; Stool:180] Intake/Output this shift: Total I/O In: 240 [P.O.:240] Out: 300 [Urine:300]  General: No distress.  Has not been out of bed.  Lungs: Clear.  Abd: Excellent bowel sounds.  Stoma is not retracted although it is dusky  Extremities: No changes  Neuro: Intact  Lab Results:  @LABLAST2 (wbc:2,hgb:2,hct:2,plt:2) BMET ) Recent Labs  06/19/15 0300 06/20/15 0451  NA 138 134*  K 4.6 5.0  CL 111 107  CO2 21* 21*  GLUCOSE 109* 112*  BUN 45* 44*  CREATININE 2.03* 1.83*  CALCIUM 8.1* 8.0*   PT/INR No results for input(s): LABPROT, INR in the last 72 hours. ABG No results for input(s): PHART, HCO3 in the last 72 hours.  Invalid input(s): PCO2, PO2  Studies/Results: No results found.  Anti-infectives: Anti-infectives    Start     Dose/Rate Route Frequency Ordered Stop   06/14/15 1800  ciprofloxacin (CIPRO) IVPB 200 mg     200 mg 100 mL/hr over 60 Minutes Intravenous Every 12 hours 06/14/15 1438 06/16/15 1834   06/10/15 1800  ciprofloxacin (CIPRO) IVPB 400 mg  Status:  Discontinued     400 mg 200 mL/hr over 60 Minutes Intravenous Every 12 hours 06/10/15 1734 06/14/15 1438   06/09/15 1927  ciprofloxacin (CIPRO) 400 MG/200ML IVPB    Comments:  Ubaldo Glassing   : cabinet override      06/09/15 1927 06/09/15 1953   06/09/15 1830  ciprofloxacin (CIPRO) IVPB 400 mg  Status:  Discontinued     400 mg 200 mL/hr over 60 Minutes  Intravenous  Once 06/09/15 1825 06/10/15 1734   06/09/15 1830  metroNIDAZOLE (FLAGYL) IVPB 500 mg     500 mg 100 mL/hr over 60 Minutes Intravenous Every 8 hours 06/09/15 1825 06/16/15 1930   06/04/15 1200  metroNIDAZOLE (FLAGYL) IVPB 500 mg  Status:  Discontinued     500 mg 100 mL/hr over 60 Minutes Intravenous Every 8 hours 06/04/15 1034 06/07/15 1405   06/04/15 0015  cefTRIAXone (ROCEPHIN) 1 g in dextrose 5 % 50 mL IVPB     1 g 100 mL/hr over 30 Minutes Intravenous  Once 06/04/15 0012 06/04/15 0108      Assessment/Plan: s/p Procedure(s): RE-EXPLORATION OF ABDOMEN  CREATION OF COLOSTOMY Advance diet Transfer form SDU  LOS: 16 days   Kathryne Eriksson. Dahlia Bailiff, MD, FACS [phone] [facility] Surgery 06/20/2015

## 2015-06-20 NOTE — Progress Notes (Signed)
Inpatient Rehabilitation   Following along for progress and medical readiness for possible IP Rehab.  Please call if questions.  [facility] Admissions Coordinator Cell [phone] Office [phone]

## 2015-06-20 NOTE — Progress Notes (Signed)
Patient with flat affect. Sad and teary eyed at times. Does not really open up when talking to her. Does not want to eat as she feels that eating will cause her to have abdominal pain again. Drinking liquids with no issues. Consult to spiritual services made. Will continue to monitor.

## 2015-06-20 NOTE — Progress Notes (Signed)
Physical Therapy Treatment Patient Details Name: Dominique [name] MRN: [id number] DOB: [birth year] Today's Date: 06/20/2015    History of Present Illness Adm 9/10 for colitis 9/16 detected perforated descending colon >>laparotomy, drain of abscess, partial colectomy, wound vac; remained on vent post op 9/18 To OR >> wound closed, colostomy created 9/21 Extubated, off pressors    PT Comments    Progressing slowly.  Pt able to tolerate EOB with acitivities designed to get UE's moving/strengthened and work on balance.  Needs to begin standing trials soon.  Follow Up Recommendations  CIR     Equipment Recommendations  Rolling walker with 5" wheels;Other (comment) (TBA next venue)    Recommendations for Other Services OT consult     Precautions / Restrictions Precautions Precautions: Fall Other Brace/Splint: abd binder when up    Mobility  Bed Mobility Overal bed mobility: Needs Assistance Bed Mobility: Rolling;Supine to Sit;Sit to Supine Rolling: Mod assist;+2 for physical assistance Sidelying to sit:  (+3 best) Supine to sit: Max assist;+2 for physical assistance (+3 best) Sit to supine: Max assist (+3)   General bed mobility comments: assist pt's legs to EOB, had pt assist scooting hips toward EOB with assist of pad.  Significant truncal assist to come up and forward.  Pt assisted minimally to EOB.  Transfers                 General transfer comment: did not attempt standing today.  Ambulation/Gait                 Stairs            Wheelchair Mobility    Modified Rankin (Stroke Patients Only)       Balance Overall balance assessment: Needs assistance Sitting-balance support: No upper extremity supported Sitting balance-Leahy Scale: Fair Sitting balance - Comments: at EOB 12-13 min.  Working on w/shifting, kicking out each LE while maintaining balance and doing "high 5's" with each upper extremity across midline with assistance x10 reps.                             Cognition Arousal/Alertness: Awake/alert Behavior During Therapy: WFL for tasks assessed/performed Overall Cognitive Status: Within Functional Limits for tasks assessed                      Exercises General Exercises - Lower Extremity Heel Slides: AROM;Strengthening;Both;10 reps;Other (comment) (graded resistance in flexion and extention)    General Comments General comments (skin integrity, edema, etc.): HR maintained between 109 and 117 bpm.  Sats in the 90's  Afterward, pt rolled to get on a bed pan.  Left for nursing to do pericare.      Pertinent Vitals/Pain Pain Assessment: Faces Pain Score: 4  Pain Location: abdoment Pain Descriptors / Indicators: Grimacing;Guarding Pain Intervention(s): Monitored during session    Home Living                      Prior Function            PT Goals (current goals can now be found in the care plan section) Acute Rehab PT Goals Patient Stated Goal: return home to 35 yo daughter PT Goal Formulation: With patient Time For Goal Achievement: 06/29/15 Potential to Achieve Goals: Good Progress towards PT goals: Progressing toward goals    Frequency  Min 3X/week    PT Plan Current plan remains appropriate  Co-evaluation             End of Session   Activity Tolerance: Patient tolerated treatment well Patient left: in bed;with nursing/sitter in room     Time: 1432-1508 PT Time Calculation (min) (ACUTE ONLY): 36 min  Charges:  $Therapeutic Activity: 23-37 mins                    G Codes:      Mottinger, Tessie Fass 06/20/2015, 5:26 PM 06/20/2015  Donnella Sham, [place] [phone]  (pager)

## 2015-06-21 DIAGNOSIS — K529 Noninfective gastroenteritis and colitis, unspecified: Secondary | ICD-10-CM

## 2015-06-21 DIAGNOSIS — J9601 Acute respiratory failure with hypoxia: Secondary | ICD-10-CM

## 2015-06-21 LAB — GLUCOSE, CAPILLARY
GLUCOSE-CAPILLARY: 96 mg/dL (ref 65–99)
GLUCOSE-CAPILLARY: 94 mg/dL (ref 65–99)
GLUCOSE-CAPILLARY: 103 mg/dL — AB (ref 65–99)
Glucose-Capillary: 102 mg/dL — ABNORMAL HIGH (ref 65–99)
Glucose-Capillary: 102 mg/dL — ABNORMAL HIGH (ref 65–99)
Glucose-Capillary: 110 mg/dL — ABNORMAL HIGH (ref 65–99)
Glucose-Capillary: 76 mg/dL (ref 65–99)

## 2015-06-21 MED ORDER — FUROSEMIDE 40 MG PO TABS
40.0000 mg | ORAL_TABLET | Freq: Every day | ORAL | Status: DC
  Administered 2015-06-22: 40 mg via ORAL
  Filled 2015-06-21: qty 1

## 2015-06-21 MED ORDER — METOPROLOL TARTRATE 25 MG PO TABS
25.0000 mg | ORAL_TABLET | Freq: Two times a day (BID) | ORAL | Status: DC
Start: 2015-06-21 — End: 2015-06-24
  Administered 2015-06-21 – 2015-06-24 (×6): 25 mg via ORAL
  Filled 2015-06-21 (×6): qty 1

## 2015-06-21 MED ORDER — METOPROLOL TARTRATE 25 MG PO TABS
37.5000 mg | ORAL_TABLET | Freq: Two times a day (BID) | ORAL | Status: DC
  Filled 2015-06-21: qty 1

## 2015-06-21 MED ORDER — METOPROLOL TARTRATE 25 MG PO TABS
25.0000 mg | ORAL_TABLET | Freq: Once | ORAL | Status: AC
  Administered 2015-06-21: 25 mg via ORAL
  Filled 2015-06-21: qty 1

## 2015-06-21 MED ORDER — GUAIFENESIN ER 600 MG PO TB12
1200.0000 mg | ORAL_TABLET | Freq: Two times a day (BID) | ORAL | Status: DC | PRN
  Filled 2015-06-21: qty 2

## 2015-06-21 NOTE — Progress Notes (Signed)
   06/21/15 1000  Clinical Encounter Type  Visited With Patient  Visit Type Initial;Psychological support;Spiritual support  Referral From Nurse  Spiritual Encounters  Spiritual Needs Emotional;Prayer  [place] responded to spiritual consult; pt in bed unable to move; pt is frustrated and desolate about not being able to sit up and move; [place] did observe pt speech somewhat strained and eyes seemed to be glossy and unable to focus as if dozing off; 10:58 AM Gwynn Burly

## 2015-06-21 NOTE — Progress Notes (Signed)
Subjective:  No complaints of dyspnea.  Objective:  Vital Signs in the last 24 hours: Temp:  [97.8 F (36.6 C)-98.9 F (37.2 C)] 97.8 F (36.6 C) (09/28 0414) Pulse Rate:  [98-140] 113 (09/28 0414) Resp:  [13-19] 15 (09/28 0414) BP: (79-128)/(52-89) 106/88 mmHg (09/28 0414) SpO2:  [90 %-100 %] 90 % (09/28 0414)  Intake/Output from previous day:  Intake/Output Summary (Last 24 hours) at 06/21/15 0756 Last data filed at 06/21/15 0422  Gross per 24 hour  Intake   2100 ml  Output   1275 ml  Net    825 ml    Physical Exam: General appearance: cooperative, no distress and morbidly obese Lungs: decreased breath sounds Heart: regular rate and rhythm and S3 gallop   Rate: 110-115  Rhythm: sinus tachycardia  Lab Results:  Recent Labs  06/19/15 0300 06/20/15 0451  WBC 27.4* 20.2*  HGB 8.4* 7.8*  PLT 412* 396    Recent Labs  06/19/15 0300 06/20/15 0451  NA 138 134*  K 4.6 5.0  CL 111 107  CO2 21* 21*  GLUCOSE 109* 112*  BUN 45* 44*  CREATININE 2.03* 1.83*    Recent Labs  06/18/15 2015 06/19/15 0300  TROPONINI 0.10* 0.10*   No results for input(s): INR in the last 72 hours.  Scheduled Meds: . antiseptic oral rinse  7 mL Mouth Rinse BID  . feeding supplement (ENSURE ENLIVE)  237 mL Oral BID BM  . feeding supplement (PRO-STAT SUGAR FREE 64)  30 mL Oral BID  . furosemide  40 mg Intravenous Daily  . heparin subcutaneous  5,000 Units Subcutaneous 3 times per day  . hydrALAZINE  12.5 mg Oral 3 times per day  . Influenza vac split quadrivalent PF  0.5 mL Intramuscular Tomorrow-1000  . insulin aspart  0-20 Units Subcutaneous 6 times per day  . isosorbide mononitrate  30 mg Oral Daily  . mesalamine  500 mg Oral QID  . metoCLOPramide  5 mg Oral TID AC  . metoprolol tartrate  37.5 mg Oral BID  . pantoprazole  40 mg Oral Daily  . pneumococcal 13-valent conjugate vaccine  0.5 mL Intramuscular Tomorrow-1000  . saccharomyces boulardii  250 mg Oral BID    Continuous Infusions: . sodium chloride 10 mL/hr at 06/20/15 0700   PRN Meds:.dextrose, fentaNYL (SUBLIMAZE) injection, ondansetron   Imaging: Imaging results have been reviewed  Cardiac Studies: Echo 06/18/15 Impressions:  - LVEF 20%, diffuse hypokinesis, moderately dilated LV with mild LV wall thickening, paradoxical septal motion, mildly dilated LA, mild TR, RVSP 40 mmHg + RAP, trivial pericardial effusion. Compared to a prior echo in 2011, the EF is now significantly decreased from previously being normal.  Assessment/Plan:  34 y/o AA female with a history of super morbid obesity. She was admitted 06/04/15 with abdominal pain and diagnosed with Chron's colitis. She had partial colectomy and exploratory lap done 06/09/15. This was complicated by peritonitis and sepsis. She was operated on again 06/11/15. She has had a slow recovery. She had acute renal insuffiencey with a peak SCr of 2.84. She has had respiratory failure and encephalopathy. Echo done 06/18/15 showed an EF of 20% with global HK.- suspected acute illness related cardiomyopathy.  Dr Mahalia Longest felt she was stable from a volume standpoint and medications were adjusted for CM.   Principal Problem:   Colitis- surgery 9/16- 9/18  Active Problems:   Morbid obesity- BMI 67.5   Perforated sigmoid colon   Peritonitis   Peritoneal abscess  Cardiomyopathy- EF 20% suspected to be secondary to acute illness   Acute respiratory failure with hypoxia   Crohn's disease   OSA on CPAP   Obesity hypoventilation syndrome   Acute renal insufficiency- SCr peak 2.8   Iron deficiency anemia   Pressure ulcer   Acute encephalopathy   Pulmonary hypertension   PLAN: She became hypotensive last night after Lopressor 10.2 mg- systolic B/P down to 11'Z. Hydralazine was never given.  Hold Hydralazine and Imdur this am. We may have to decrease Lopressor, will try 25 mg this am and see how she does. F/U labs - BMP and CBC pending (Hgb  7.8 yesterday).   Kerin Ransom PA-C 06/21/2015, 7:56 AM [phone]  Agree with note written by Kerin Ransom [facility]  Agree with med adjustments per Kerin Ransom [facility] in setting of hypotension and Med Rx of NISCM  Quay Burow 06/21/2015 11:13 AM

## 2015-06-21 NOTE — Progress Notes (Signed)
[facility] Progress Note  10 Days Post-Op  Subjective: Pt seen and evaluated at bedside. Rates her pain 7/10. Tolerating PO liquids but pt still afraid to eat due to pain and history of nausea. Pt expresses mucous in her throat and it is affecting her voice- constantly clearing her throat. Will order Mucinex. Has no other complaints today other than pain. She states she thinks she will tolerate food better once she gets up and moving - PT is coming to see her today. Pt will get wound vac dressing changed at bedside today. Ostomy and wound vac in place.   Pt seen by cardiology yesterday, see A&P.  Objective: Vital signs in last 24 hours: Temp:  [97.8 F (36.6 C)-98.9 F (37.2 C)] 97.8 F (36.6 C) (09/28 0414) Pulse Rate:  [98-140] 113 (09/28 0414) Resp:  [13-19] 15 (09/28 0414) BP: (79-128)/(52-89) 106/88 mmHg (09/28 0414) SpO2:  [90 %-100 %] 90 % (09/28 0414) Last BM Date: 06/20/15  Intake/Output from previous day: 09/27 0701 - 09/28 0700 In: 2340 [P.O.:2160; I.V.:180] Out: 1575 [Urine:1325; Drains:100; Stool:150] Intake/Output this shift:    PE: Gen:  Alert, NAD, pleasant Card:  RRR, no M/G/R heard Pulm:  CTA, no W/R/R Abd: Soft, mildly tender, ND, +BS, no HSM, midline incision C/D/I, vac drain with serosanguinous drainage, ostomy with small amount of brown stool, looking less dusky, still small amount sanguinous fluid. Ext:  No erythema, edema, or tenderness  Lab Results:   Recent Labs  06/19/15 0300 06/20/15 0451  WBC 27.4* 20.2*  HGB 8.4* 7.8*  HCT 27.6* 26.3*  PLT 412* 396   BMET  Recent Labs  06/19/15 0300 06/20/15 0451  NA 138 134*  K 4.6 5.0  CL 111 107  CO2 21* 21*  GLUCOSE 109* 112*  BUN 45* 44*  CREATININE 2.03* 1.83*  CALCIUM 8.1* 8.0*   PT/INR No results for input(s): LABPROT, INR in the last 72 hours. CMP     Component Value Date/Time   NA 134* 06/20/2015 0451   K 5.0 06/20/2015 0451   CL 107 06/20/2015 0451   CO2 21*  06/20/2015 0451   GLUCOSE 112* 06/20/2015 0451   BUN 44* 06/20/2015 0451   CREATININE 1.83* 06/20/2015 0451   CREATININE 0.52 12/29/2014 1557   CALCIUM 8.0* 06/20/2015 0451   PROT 5.6* 06/20/2015 0451   ALBUMIN 1.7* 06/20/2015 0451   AST 17 06/20/2015 0451   ALT 16 06/20/2015 0451   ALKPHOS 81 06/20/2015 0451   BILITOT 0.8 06/20/2015 0451   GFRNONAA 35* 06/20/2015 0451   GFRAA 40* 06/20/2015 0451   Lipase     Component Value Date/Time   LIPASE 10* 06/09/2015 1325    Studies/Results: No results found.  Anti-infectives: Anti-infectives    Start     Dose/Rate Route Frequency Ordered Stop   06/14/15 1800  ciprofloxacin (CIPRO) IVPB 200 mg     200 mg 100 mL/hr over 60 Minutes Intravenous Every 12 hours 06/14/15 1438 06/16/15 1834   06/10/15 1800  ciprofloxacin (CIPRO) IVPB 400 mg  Status:  Discontinued     400 mg 200 mL/hr over 60 Minutes Intravenous Every 12 hours 06/10/15 1734 06/14/15 1438   06/09/15 1927  ciprofloxacin (CIPRO) 400 MG/200ML IVPB    Comments:  Ubaldo Glassing   : cabinet override      06/09/15 1927 06/09/15 1953   06/09/15 1830  ciprofloxacin (CIPRO) IVPB 400 mg  Status:  Discontinued     400 mg 200 mL/hr over 60 Minutes  Intravenous  Once 06/09/15 1825 06/10/15 1734   06/09/15 1830  metroNIDAZOLE (FLAGYL) IVPB 500 mg     500 mg 100 mL/hr over 60 Minutes Intravenous Every 8 hours 06/09/15 1825 06/16/15 1930   06/04/15 1200  metroNIDAZOLE (FLAGYL) IVPB 500 mg  Status:  Discontinued     500 mg 100 mL/hr over 60 Minutes Intravenous Every 8 hours 06/04/15 1034 06/07/15 1405   06/04/15 0015  cefTRIAXone (ROCEPHIN) 1 g in dextrose 5 % 50 mL IVPB     1 g 100 mL/hr over 30 Minutes Intravenous  Once 06/04/15 0012 06/04/15 0108     Procedure/Significant Events: 9/12 Flexible Sigmoidoscopy ;Severe chronic inflammatory changes in the proximal sigmoid and descending colon 9/16 Emergent exploratory laparoscopy; perforated descending colon, colitis  descending/sigmoid colon 9/18 Exploratory Laparoscopy, reexploration of abdomen with closure and mobilization of splenic flexure; creation of colostomy 9/25 echocardiogram;Left ventricle: moderately dilated. -mild LVH. -LVEF= 20%. Diffuse hypokinesis.  - Pulmonary arteries: PA peak pressure: 40 mm Hg (S) + RAP. - Pericardium, A trivial pericardial effusion  Assessment/Plan Perforated descending colon, colitis POD 10,12 s/p ex lap with DRAINAGE OF INTRA-ABDOMINAL ABSCESS, PARTIAL COLON RESECTION, re-exploration and creation of colostomy and abdominal closure - IVF, encourage PO intake and advance diet as tolerated, soft diet ordered. - transfer from stepdown - encourage movement - PT worked with pt yesterday- she is progressing and sitting up doing exercises on edge of bed. PT will start standing trials soon. - inpatient rehab following PT progress to assess medical readiness for IP rehab. - wound vac in place, dressing change today 9/28 - WBC 20.4 yest. and trending down. Finished post-op 7-day course of cipro/flagyl on 9/23. Final body fluid culture report: no growth to date. WBC elevation potentially due to steroid use. No fevers.  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 They suspect CM secondary to sepsis and acute illness. Pt was to add hydralazine and nitrates but experienced hypotension on her metoprolol yesterday so hydralazine is held for now, cardiology following. Potential addition of ACEI if AKI resolves.  IBD as seen on pathology - mesalamine capsule 1,000 mg PO QID - probiotic - GI recommended weaning of steroids and d/c of flagyl - GI signed off  AKI - resolving slowly - SCr 1.83, GFR 40    LOS: 17 days    Dominique Palmer  PA Student 06/21/2015, 7:20 AM Pager:  [phone]

## 2015-06-21 NOTE — Progress Notes (Signed)
[facility] TEAM 1 - Stepdown/ICU TEAM PROGRESS NOTE  Dominique [name] KGY:[id number] DOB: September 04, 1981 DOA: 06/03/2015 PCP: Benito Mccreedy, MD  Admit HPI / Brief Narrative: 34 year old F Hx Asthma, OSA/OHS, Hepatic Steatosis, and Recurrent Colitis since Jan 2016 who presented to the ED with severe abdominal pain with diarrhea and occasional vomiting. She had not been able to see GI due to insurance and referral issues. When her sx became worse than her usual, she presented to the ED.    HPI/Subjective: The patient is resting comfortably in bed.  She complains of mild generalized abdominal cramping pain.  She denies chest pain fevers chills nausea or vomiting.  She has noted that the tip of her right pinky finger is black today but she states it is not particularly painful and she cannot pinpoint exactly when this occurred.  Assessment/Plan:  Newly diagnosed Crohn's disease   -S/P 9/12 flexible sigmoidoscopy w/ bx consistent with Crohn's disease -Pentasa 500 QID per GI recs  -stopped steroids as no role presently per GI opinion   Sepsis due to Perforated left colon w/ Peritonitis and Peritoneal abscess -9/16 patient diagnosed with perforated left colon from Crohn's colitis -Emergent exploratory laparoscopy 9/16 w/ partial colon resection and drainage of intra-abdominal abscess -Returned to OR 9/18 for reexploration of abdomen with closure, and mobilization of the splenic flexure with creation of colostomy -ongoing care per Gen Surgery   Persistent Sinus tachycardia  -no evidence of PE on angiogram 9/16 - TSH normal - likely related to severe CHF - trial of scheduled BB being complicated by episodic hypotension   Severe systolic CHF - ?newly diagnosed  -EF 20% per TTE 06/18/15 w/ diffuse hypokinesis - felt to be critical care CM and likely to resolve - Cardiology following - cont diuresis as able (replace foley to prevent skin breakdown - pt not able to get out of bed)  Asthma -Albuterol  nebulizer PRN -quiescent   OSA/OHS -CPAP/BIPAP QHS per respiratory   Acute respiratory failure with hypoxia/VDRF -Resolved  Anemia of critical illness  -hemoglobin stable - no evidence of blood loss   Acute renal failure due to septic shock/ATN -slowly improving - follow trend   Acute encephalopathy -Resolved  Severe malnutrition in context of acute illness/injury  Super Morbid Obesity - Body mass index is 67.33 kg/(m^2).   Ischemic tip of the right 5th digit -felt to be due to sepsis/acute systolic CHF - follow - pt informed will likely slough off w/ time   Code Status: FULL Family Communication: no family present at time of exam Disposition Plan: hopeful for eventual CIR  Consultants: [facility] PCC[facility]  Procedures: 9/12 FLEX SIG - Severe chronic inflammatory changes in the proximal sigmoid and descending colon 9/16 Emergent exploratory laparoscopy perforated descending colon, colitis descending/sigmoid colon 9/18 Exploratory Laparoscopy, reexploration of abdomen with closure and mobilization of splenic flexure; creation of colostomy 9/25 TTE Left ventricle: moderately dilated. -mild LVH. -LVEF= 20%. Diffuse hypokinesis.  - Pulmonary arteries: PA peak pressure: 40 mm Hg (S) + RAP. - Pericardium, A trivial pericardial effusion  Antibiotics: Ceftriaxone 9/10 Flagyl 9/11 > 9/13 Flagyl 9/16 > 9/23 Cipro 9/16 > 9/23  DVT prophylaxis: SQ Heparin   Objective: Blood pressure 90/48, pulse 114, temperature 98.1 F (36.7 C), temperature source Oral, resp. rate 20, height 5' 2"  (1.575 m), weight 167.009 kg (368 lb 3 oz), last menstrual period 05/06/2015, SpO2 100 %.  Intake/Output Summary (Last 24 hours) at 06/21/15 1639 Last data filed at 06/21/15 1600  Gross per 24 hour  Intake   1380 ml  Output    585 ml  Net    795 ml   Exam: General: No acute respiratory distress - alert and conversant  Lungs: Clear to auscultation bilaterally without wheezes or  crackles - distant breath sounds   Cardiovascular: Distant heart sounds - tachycardic at 110 bpm without appreciable murmur or rub  Abdomen: Nontender, morbidly obese, soft, bowel sounds positive, no rebound, no ascites, no appreciable mass Extremities: No significant cyanosis, clubbing; 1+ edema bilateral lower extremities  Data Reviewed: Basic Metabolic Panel:  Recent Labs Lab 06/15/15 0430 06/16/15 0350 06/17/15 0500 06/17/15 0814 06/18/15 0330 06/19/15 0300 06/20/15 0451 06/20/15 0948  NA 130* 132* 135  --  135 138 134*  --   K 4.9 4.7 4.4  --  4.5 4.6 5.0  --   CL 103 105 105  --  110 111 107  --   CO2 21* 21* 21*  --  19* 21* 21*  --   GLUCOSE 116* 99 98  --  107* 109* 112*  --   BUN 37* 45* 49*  --  46* 45* 44*  --   CREATININE 2.84* 2.75* 2.60*  --  2.31* 2.03* 1.83*  --   CALCIUM 7.0* 7.2* 7.5*  --  7.6* 8.1* 8.0*  --   MG  --   --   --  1.8 1.9 2.0  --  2.1  PHOS 6.6* 5.9*  --  4.7* 4.5 4.1  --   --     CBC:  Recent Labs Lab 06/16/15 0350 06/16/15 1814 06/17/15 0500 06/18/15 0330 06/19/15 0300 06/20/15 0451  WBC 23.8*  --  24.5* 22.7* 27.4* 20.2*  NEUTROABS  --   --   --  20.4* 25.5*  --   HGB 7.0* 6.6* 7.8* 7.5* 8.4* 7.8*  HCT 22.0* 21.1* 23.9* 23.7* 27.6* 26.3*  MCV 59.9*  --  62.2* 64.1* 69.2* 72.1*  PLT 196  --  241 256 412* 396    Liver Function Tests:  Recent Labs Lab 06/15/15 0430 06/16/15 0350 06/18/15 0330 06/19/15 0300 06/20/15 0451  AST  --   --  13* 15 17  ALT  --   --  16 16 16   ALKPHOS  --   --  107 101 81  BILITOT  --   --  1.2 1.0 0.8  PROT  --   --  5.6* 5.8* 5.6*  ALBUMIN 1.2* 1.3* 1.5* 1.7* 1.7*    Cardiac Enzymes:  Recent Labs Lab 06/17/15 1508 06/17/15 2000 06/18/15 1403 06/18/15 2015 06/19/15 0300  TROPONINI 0.03 0.09* 0.11* 0.10* 0.10*    CBG:  Recent Labs Lab 06/20/15 2050 06/21/15 0005 06/21/15 0416 06/21/15 0543 06/21/15 0807  GLUCAP 96 102* 76 102* 96    Recent Results (from the past 240  hour(s))  MRSA PCR Screening     Status: None   Collection Time: 06/13/15 11:15 AM  Result Value Ref Range Status   MRSA by PCR NEGATIVE NEGATIVE Final    Comment:        The GeneXpert MRSA Assay (FDA approved for NASAL specimens only), is one component of a comprehensive MRSA colonization surveillance program. It is not intended to diagnose MRSA infection nor to guide or monitor treatment for MRSA infections.   Culture, Urine     Status: None   Collection Time: 06/18/15  3:30 AM  Result Value Ref Range Status   Specimen Description URINE, CATHETERIZED  Final   Special Requests NONE  Final   Culture >=100,000 COLONIES/mL LACTOBACILLUS SPECIES  Final   Report Status 06/19/2015 FINAL  Final     Studies:   Recent x-ray studies have been reviewed in detail by the Attending Physician  Scheduled Meds:  Scheduled Meds: . antiseptic oral rinse  7 mL Mouth Rinse BID  . feeding supplement (ENSURE ENLIVE)  237 mL Oral BID BM  . feeding supplement (PRO-STAT SUGAR FREE 64)  30 mL Oral BID  . furosemide  40 mg Intravenous Daily  . heparin subcutaneous  5,000 Units Subcutaneous 3 times per day  . Influenza vac split quadrivalent PF  0.5 mL Intramuscular Tomorrow-1000  . insulin aspart  0-20 Units Subcutaneous 6 times per day  . mesalamine  500 mg Oral QID  . metoCLOPramide  5 mg Oral TID AC  . metoprolol tartrate  37.5 mg Oral BID  . pantoprazole  40 mg Oral Daily  . pneumococcal 13-valent conjugate vaccine  0.5 mL Intramuscular Tomorrow-1000  . saccharomyces boulardii  250 mg Oral BID    Time spent on care of this patient: 35 mins   Majestic Brister T , MD   [facility] Office  [phone] Pager - Text Page per Shea Evans as per below:  On-Call/Text Page:      [email or web address]      password [email or web address]  If 7PM-7AM, please contact night-coverage [email or web address] Password [email or web address] 06/21/2015, 4:39 PM   LOS: 17 days

## 2015-06-21 NOTE — Progress Notes (Signed)
16 fr foley placed per MD order d/t active IV diuresis. Pt tolerated procedure well Shirline Frees RN present as second person during insertion. Peri care care with soap and water provided prior to insertion and sterile procedure maintained throughout. Will continue to monitor.

## 2015-06-21 NOTE — Evaluation (Signed)
Occupational Therapy Evaluation Patient Details Name: Dominique [name] MRN: [id number] DOB: 1980-09-25 Today's Date: 06/21/2015    History of Present Illness Adm 9/10 for colitis 9/16 detected perforated descending colon >>laparotomy, drain of abscess, partial colectomy, wound vac; remained on vent post op 9/18 To OR >> wound closed, colostomy created 9/21 Extubated, off pressors   Clinical Impression   Prior to admission, pt was functioning independently and caring for her 10 year old daughter.  She presents with generalized weakness, decreased balance, anxiety with regard to falling and obesity interfering with ability to perform ADL and mobility.  Will follow acutely.  Recommending inpatient rehab.    Follow Up Recommendations  CIR    Equipment Recommendations   (to be determined in next venue)    Recommendations for Other Services       Precautions / Restrictions Precautions Precautions: Fall Precaution Comments: abdominal wound vac Restrictions Weight Bearing Restrictions: No      Mobility Bed Mobility Overal bed mobility: Needs Assistance Bed Mobility: Rolling;Supine to Sit;Sit to Supine Rolling: +2 for physical assistance;Mod assist   Supine to sit: Max assist (+3) Sit to supine: Max assist (+3)      Transfers                 General transfer comment: did not attempt standing today.    Balance Overall balance assessment: Needs assistance Sitting-balance support: Feet supported Sitting balance-Leahy Scale: Poor Sitting balance - Comments: sat x 12 minutes and ate lunch meal, encouragement to reach out for items across tray and to stabilize bowl while eating                                    ADL Overall ADL's : Needs assistance/impaired Eating/Feeding: Set up;Sitting Eating/Feeding Details (indicate cue type and reason): sat at EOB, assist to set up tray as pt reliant on 1 hand on bed for sitting balance, increased spillage Grooming:  Wash/dry hands;Wash/dry face;Bed level;Set up   Upper Body Bathing: Maximal assistance;Bed level   Lower Body Bathing: +2 for physical assistance;Total assistance;Bed level   Upper Body Dressing : Moderate assistance;Bed level Upper Body Dressing Details (indicate cue type and reason): changed gown Lower Body Dressing: +2 for physical assistance;Total assistance;Bed level       Toileting- Clothing Manipulation and Hygiene: +2 for physical assistance;Total assistance;Bed level Toileting - Clothing Manipulation Details (indicate cue type and reason): used bedpan             Vision     Perception     Praxis      Pertinent Vitals/Pain Pain Assessment: Faces Faces Pain Scale: Hurts little more Pain Location: abdomen Pain Descriptors / Indicators: Grimacing Pain Intervention(s): Monitored during session;Repositioned     Hand Dominance Right   Extremity/Trunk Assessment Upper Extremity Assessment Upper Extremity Assessment: RUE deficits/detail;LUE deficits/detail RUE Deficits / Details: 3/5 shoulder, 4-/5 elbow to hand RUE Coordination: decreased gross motor LUE Deficits / Details: 2+/5 shoulder, 3+/5 elbow, 4-/5 grip LUE Coordination: decreased gross motor   Lower Extremity Assessment Lower Extremity Assessment: Defer to PT evaluation   Cervical / Trunk Assessment Cervical / Trunk Exceptions: morbid obesity   Communication Communication Communication: No difficulties (low volume)   Cognition Arousal/Alertness: Awake/alert Behavior During Therapy: WFL for tasks assessed/performed Overall Cognitive Status: Within Functional Limits for tasks assessed  General Comments       Exercises       Shoulder Instructions      Home Living Family/patient expects to be discharged to:: Private residence Living Arrangements: Children;Parent (75 y.o. daughter) Available Help at Discharge: Family Type of Home: Apartment Home Access: Stairs to  enter Technical brewer of Steps: 8 Entrance Stairs-Rails: Right;Left;Can reach both Home Layout: One level     Bathroom Shower/Tub: Teacher, early years/pre: Standard     Home Equipment: None          Prior Functioning/Environment Level of Independence: Independent             OT Diagnosis: Generalized weakness;Acute pain   OT Problem List: Decreased strength;Decreased activity tolerance;Impaired balance (sitting and/or standing);Decreased knowledge of use of DME or AE;Obesity;Impaired UE functional use;Pain   OT Treatment/Interventions: Self-care/ADL training;Therapeutic exercise;Therapeutic activities;Balance training;Patient/family education;DME and/or AE instruction    OT Goals(Current goals can be found in the care plan section) Acute Rehab OT Goals Patient Stated Goal: return home to 68 yo daughter OT Goal Formulation: With patient Time For Goal Achievement: 07/05/15 Potential to Achieve Goals: Good ADL Goals Pt Will Perform Grooming: with supervision;sitting Pt Will Perform Upper Body Bathing: with min assist;sitting Pt Will Perform Upper Body Dressing: with min assist;sitting Pt/caregiver will Perform Home Exercise Program: Both right and left upper extremity;With theraband;Independently Additional ADL Goal #1: Pt will sit EOB x 15 minutes unsupported while engaged activity. Additional ADL Goal #2: Pt will roll with moderate assistance to assist with bed positioning for ADL.  OT Frequency: Min 3X/week   Barriers to D/C:            Co-evaluation PT/OT/SLP Co-Evaluation/Treatment: Yes Reason for Co-Treatment: For patient/therapist safety   OT goals addressed during session: ADL's and self-care      End of Session Equipment Utilized During Treatment: Oxygen Nurse Communication: Mobility status  Activity Tolerance: Patient tolerated treatment well Patient left: in bed;with call bell/phone within reach   Time: 1400-1500 OT Time  Calculation (min): 60 min Charges:  OT General Charges $OT Visit: 1 Procedure OT Evaluation $Initial OT Evaluation Tier I: 1 Procedure OT Treatments $Therapeutic Activity: 8-22 mins G-Codes:    [name] 06/21/2015, 3:59 PM [phone]

## 2015-06-21 NOTE — Consult Note (Addendum)
WOC follow-up: Ostomy pouching: Current pouch is leaking. Applied 2 piece pouch with barrier ring. Stoma is pale and tan with some slough, slightly above skin level, mucutaneous separation occurring to peristomal edges and slight amt thick tan drainage which appears to be pus drains when wafer is pressed on at 6:00 o'clock.  Mod amt brown stool in the pouch Education provided: Demonstrated pouch change using 2 piece pouch and a barrier ring to attempt to maintain seal.  Enrolled patient in [place] program: No Ostomy supplies ordered to the room for bedside nurse use. No family in the room and pt is very tired and does not  ask questions. Will resume teaching sessions when stable and out of ICU.   Wound type: Full thickness midline abd wound followed by CCS for plan of care. Vac dressing changed 21X7.5X7cm Wound bed: 90% beefy red, 10% yellow Drainage (amount, consistency, odor) Mod amt yellow drainage, no odor Periwound: Intact skin surrounding Dressing procedure/placement/frequency: Applied one piece black foam to cont suction at 137m. Pt tolerated with mod amt discomfort after pain meds given earlier. Plan for bedside nurse to change Q M/W/F. ,  DJulien GirtMSN, RFieldale CLat[place]

## 2015-06-21 NOTE — Progress Notes (Signed)
Physical Therapy Treatment Patient Details Name: Dominique [name] MRN: [id number] DOB: May 28, 1981 Today's Date: 06/21/2015    History of Present Illness Adm 9/10 for colitis 9/16 detected perforated descending colon >>laparotomy, drain of abscess, partial colectomy, wound vac; remained on vent post op 9/18 To OR >> wound closed, colostomy created 9/21 Extubated, off pressors    PT Comments    Progressing slowly.  Emphasized sitting EOB.  Utilized graded assist for rolling to toilet.  Attempted standing, but pt fearful and didn't fully commit.  Follow Up Recommendations  CIR     Equipment Recommendations  Rolling walker with 5" wheels (bariatric)    Recommendations for Other Services       Precautions / Restrictions Precautions Precautions: Fall Precaution Comments: abdominal wound vac Restrictions Weight Bearing Restrictions: No    Mobility  Bed Mobility Overal bed mobility: Needs Assistance Bed Mobility: Rolling;Supine to Sit;Sit to Supine Rolling: +2 for physical assistance;Mod assist   Supine to sit: Max assist (+3) Sit to supine: Max assist (+3)      Transfers                 General transfer comment: did not attempt standing today.  Ambulation/Gait                 Stairs            Wheelchair Mobility    Modified Rankin (Stroke Patients Only)       Balance Overall balance assessment: Needs assistance Sitting-balance support: Feet supported Sitting balance-Leahy Scale: Poor Sitting balance - Comments: sat x 12 minutes and ate lunch meal, encouragement to reach out for items across tray and to stabilize bowl while eating                            Cognition Arousal/Alertness: Awake/alert Behavior During Therapy: WFL for tasks assessed/performed Overall Cognitive Status: Within Functional Limits for tasks assessed                      Exercises      General Comments        Pertinent Vitals/Pain Pain  Assessment: Faces Faces Pain Scale: Hurts little more Pain Location: abdomen Pain Descriptors / Indicators: Grimacing Pain Intervention(s): Monitored during session    Home Living Family/patient expects to be discharged to:: Private residence Living Arrangements: Children;Parent (26 y.o. daughter) Available Help at Discharge: Family Type of Home: Apartment Home Access: Stairs to enter Entrance Stairs-Rails: Right;Left;Can reach both Home Layout: One level Home Equipment: None      Prior Function Level of Independence: Independent          PT Goals (current goals can now be found in the care plan section) Acute Rehab PT Goals Patient Stated Goal: return home to 25 yo daughter PT Goal Formulation: With patient Time For Goal Achievement: 06/29/15 Potential to Achieve Goals: Good Progress towards PT goals: Progressing toward goals    Frequency  Min 3X/week    PT Plan Current plan remains appropriate    Co-evaluation PT/OT/SLP Co-Evaluation/Treatment: Yes Reason for Co-Treatment: Complexity of the patient's impairments (multi-system involvement);For patient/therapist safety PT goals addressed during session: Mobility/safety with mobility OT goals addressed during session: ADL's and self-care     End of Session Equipment Utilized During Treatment: Other (comment) (hover matt) Activity Tolerance: Patient tolerated treatment well Patient left: in bed;with call bell/phone within reach     Time: 1355-1500 PT Time Calculation (  min) (ACUTE ONLY): 65 min  Charges:  $Therapeutic Activity: 23-37 mins                    G Codes:      Riggin Cuttino, Tessie Fass 06/21/2015, 5:05 PM 06/21/2015  Donnella Sham, PT [phone]  (pager)

## 2015-06-22 DIAGNOSIS — N289 Disorder of kidney and ureter, unspecified: Secondary | ICD-10-CM

## 2015-06-22 DIAGNOSIS — I270 Primary pulmonary hypertension: Secondary | ICD-10-CM

## 2015-06-22 DIAGNOSIS — G4733 Obstructive sleep apnea (adult) (pediatric): Secondary | ICD-10-CM

## 2015-06-22 DIAGNOSIS — N179 Acute kidney failure, unspecified: Secondary | ICD-10-CM | POA: Diagnosis present

## 2015-06-22 DIAGNOSIS — K50914 Crohn's disease, unspecified, with abscess: Secondary | ICD-10-CM

## 2015-06-22 LAB — GLUCOSE, CAPILLARY
GLUCOSE-CAPILLARY: 100 mg/dL — AB (ref 65–99)
GLUCOSE-CAPILLARY: 102 mg/dL — AB (ref 65–99)
Glucose-Capillary: 100 mg/dL — ABNORMAL HIGH (ref 65–99)
Glucose-Capillary: 87 mg/dL (ref 65–99)
Glucose-Capillary: 88 mg/dL (ref 65–99)
Glucose-Capillary: 97 mg/dL (ref 65–99)

## 2015-06-22 LAB — CBC
HEMATOCRIT: 26.7 % — AB (ref 36.0–46.0)
Hemoglobin: 7.6 g/dL — ABNORMAL LOW (ref 12.0–15.0)
MCH: 20.9 pg — AB (ref 26.0–34.0)
MCHC: 28.5 g/dL — ABNORMAL LOW (ref 30.0–36.0)
MCV: 73.6 fL — AB (ref 78.0–100.0)
Platelets: 338 10*3/uL (ref 150–400)
RBC: 3.63 MIL/uL — AB (ref 3.87–5.11)
RDW: 26.1 % — ABNORMAL HIGH (ref 11.5–15.5)
WBC: 13.6 10*3/uL — AB (ref 4.0–10.5)

## 2015-06-22 LAB — BASIC METABOLIC PANEL
Anion gap: 5 (ref 5–15)
BUN: 39 mg/dL — ABNORMAL HIGH (ref 6–20)
CHLORIDE: 108 mmol/L (ref 101–111)
CO2: 24 mmol/L (ref 22–32)
Calcium: 8.2 mg/dL — ABNORMAL LOW (ref 8.9–10.3)
Creatinine, Ser: 1.53 mg/dL — ABNORMAL HIGH (ref 0.44–1.00)
GFR calc non Af Amer: 43 mL/min — ABNORMAL LOW (ref >60)
GFR, EST AFRICAN AMERICAN: 50 mL/min — AB (ref >60)
Glucose, Bld: 92 mg/dL (ref 65–99)
POTASSIUM: 4.4 mmol/L (ref 3.5–5.1)
SODIUM: 137 mmol/L (ref 135–145)

## 2015-06-22 LAB — PREPARE RBC (CROSSMATCH)

## 2015-06-22 MED ORDER — FUROSEMIDE 40 MG PO TABS
40.0000 mg | ORAL_TABLET | Freq: Two times a day (BID) | ORAL | Status: DC
  Administered 2015-06-22 – 2015-06-26 (×8): 40 mg via ORAL
  Filled 2015-06-22 (×11): qty 1

## 2015-06-22 MED ORDER — SODIUM CHLORIDE 0.9 % IV SOLN: Freq: Once | INTRAVENOUS | Status: DC

## 2015-06-22 NOTE — Care Management Note (Signed)
Case Management Note  Patient Details  Name: Dominique [name] MRN: [id number] Date of Birth: September 01, 1981  Subjective/Objective:              Admitted with Abd pain/ Crohn's colitis, s/p ex.lap/colectomy 9/16 , re-exploration/colostomy 06/11/15, wound vac to abd.   Action/Plan: CIR when medically stable.  Expected Discharge Date:       unknown           Expected Discharge Plan:  rehab In-House Referral:  NA  Discharge planning Services  CM Consult  Post Acute Care Choice:    Choice offered to:  NA  DME Arranged:    DME Agency:     HH Arranged:    HH Agency:     Status of Service:  In process, will continue to follow  Medicare Important Message Given:    Date Medicare IM Given:    Medicare IM give by:    Date Additional Medicare IM Given:    Additional Medicare Important Message give by:     If discussed at [place] of Stay Meetings, dates discussed:  06/22/15   Additional Comments:   Pt morbidly obese, slow progress post- operatively with mobility  lives with mother and sister.  Margaretha Sheffield [phone], Roselee Culver (Mother)  [phone]   Whitman Hero [place], [place] [phone] 06/22/2015, 11:52 AM

## 2015-06-22 NOTE — Progress Notes (Signed)
[facility] Progress Note 11 Days Post-Op  Subjective: Patient doing okay.  Has not gotten out of bed yet.  Limited by heart condition to a large extent.  No distress and eating better.  Objective: Vital signs in last 24 hours: Temp:  [97.7 F (36.5 C)-98.4 F (36.9 C)] 97.7 F (36.5 C) (09/29 0735) Pulse Rate:  [76-115] 102 (09/29 0800) Resp:  [12-36] 12 (09/29 0800) BP: (90-126)/(48-83) 111/83 mmHg (09/29 0800) SpO2:  [97 %-100 %] 100 % (09/29 0800) Last BM Date: 06/22/15  Intake/Output from previous day: 09/28 0701 - 09/29 0700 In: 840 [P.O.:840] Out: 2000 [Urine:1150; Drains:350; Stool:500] Intake/Output this shift: Total I/O In: -  Out: 300 [Urine:250; Stool:50]  General: No acute distress  Lungs: Clear  Abd: Good bowel sounds.  Stoma is working very well.  Extremities: No changes  Neuro: Intact  Lab Results:  @LABLAST2 (wbc:2,hgb:2,hct:2,plt:2) BMET ) Recent Labs  06/20/15 0451 06/22/15 0358  NA 134* 137  K 5.0 4.4  CL 107 108  CO2 21* 24  GLUCOSE 112* 92  BUN 44* 39*  CREATININE 1.83* 1.53*  CALCIUM 8.0* 8.2*   PT/INR No results for input(s): LABPROT, INR in the last 72 hours. ABG No results for input(s): PHART, HCO3 in the last 72 hours.  Invalid input(s): PCO2, PO2  Studies/Results: No results found.  Anti-infectives: Anti-infectives    Start     Dose/Rate Route Frequency Ordered Stop   06/14/15 1800  ciprofloxacin (CIPRO) IVPB 200 mg     200 mg 100 mL/hr over 60 Minutes Intravenous Every 12 hours 06/14/15 1438 06/16/15 1834   06/10/15 1800  ciprofloxacin (CIPRO) IVPB 400 mg  Status:  Discontinued     400 mg 200 mL/hr over 60 Minutes Intravenous Every 12 hours 06/10/15 1734 06/14/15 1438   06/09/15 1927  ciprofloxacin (CIPRO) 400 MG/200ML IVPB    Comments:  Ubaldo Glassing   : cabinet override      06/09/15 1927 06/09/15 1953   06/09/15 1830  ciprofloxacin (CIPRO) IVPB 400 mg  Status:  Discontinued     400 mg 200 mL/hr over 60 Minutes  Intravenous  Once 06/09/15 1825 06/10/15 1734   06/09/15 1830  metroNIDAZOLE (FLAGYL) IVPB 500 mg     500 mg 100 mL/hr over 60 Minutes Intravenous Every 8 hours 06/09/15 1825 06/16/15 1930   06/04/15 1200  metroNIDAZOLE (FLAGYL) IVPB 500 mg  Status:  Discontinued     500 mg 100 mL/hr over 60 Minutes Intravenous Every 8 hours 06/04/15 1034 06/07/15 1405   06/04/15 0015  cefTRIAXone (ROCEPHIN) 1 g in dextrose 5 % 50 mL IVPB     1 g 100 mL/hr over 30 Minutes Intravenous  Once 06/04/15 0012 06/04/15 0108      Assessment/Plan: s/p Procedure(s): RE-EXPLORATION OF ABDOMEN  CREATION OF COLOSTOMY Advance diet Continue with PT and likely to go to Rehab.  LOS: 18 days   Kathryne Eriksson. Dahlia Bailiff, MD, FACS [phone] [facility] 06/22/2015

## 2015-06-22 NOTE — Progress Notes (Signed)
[facility] TEAM 1 - Stepdown/ICU TEAM Progress Note  Dominique [name] UEK:[id number] DOB: [birth year] DOA: 06/03/2015 PCP: Benito Mccreedy, MD  Admit HPI / Brief Narrative: 34 year old BF PMHx Asthma, OSA/OHS, Hepatic Steatosis and Recurrent Colitis since jan 2016  Came to the ED with cc of severe abdominal pain. She said the pain started back in Jan and improved only between Feb and May. Since June she continued to have abdominal pain with diarrhea and occasional vomiting. She has not been able to see GI for insurance and referral issues until last week. She said after discussing her last CT abdomen findings with GI, the plan for her was to continue a course of abx and come back but last Friday she started having severe abdominal pain that got more diffuse ( usually she has lower abdominal pain) associated with vomiting daily for 2-3 times ( non bilious and non bloody) and daily diarrhea ( soft stool with occasional blood0. She denied fever or chills. No other complaints. No chest pain , cough or dyspnea  HPI/Subjective: 9/29 A/O 4, negative CP, negative SOB, negative N/V, has been eating some solid foods.   Assessment/Plan: Acute /recurrent colitis:  -Patient S/P sigmoidoscopy with new diagnosis of Crohn's disease  Crohn's disease -Per biopsies from 9/12 flexible sigmoidoscopy consistent with Crohn's disease. -Continue mesalamine 1000 mg QID  Sepsis unspecified organism/Perforated left colon/Peritonitis/Peritoneal abscess -9/16 patient diagnosed with perforated left colon from Crohn's colitis -Emergent exploratory laparoscopy -See Crohn's disease -Strict I&O since admission; +33.3 L -Watch for fluid overload -Echocardiogram; CHF and pulmonary hypertension see report below -Out of bed to chair per shift  Chronic systolic CHF -EF= 15% -0/56 EKG with EKG from 06/12/2015 no significant change. Sinus tachycardia with criteria meeting inferior infarct undetermined age. -Transfuse for  hemoglobin<8 -9/25 Transfuse one unit PRBC -9/29 CVP= 20; patient volume overloaded -HR marginally better controlled will increase diuresis Lasix 40 mg BID -Cardiology consult; will restart Imdur after patient diuresed and BP tolerates NOTE; Imdur stopped yesterday by cardiology -9/29 Transfuse one unit PRBC   Pulmonary hypertension -Patient's BP will not tolerate a vasal dilator.  Asthma:  -Albuterol nebulizer PRN -Stable  OSA/OHS -CPAP/BIPAP per respiratory -If patient declined secondary to refractory nausea please use Thorazine  Acute respiratory failure with hypoxia/VDRF -Resolved  Anemia of critical illness  -Monitor closely  Acute renal failure -Gentle diuresis   Acute encephalopathy -Resolved    Code Status: FULL Family Communication: family present at time of exam Disposition Plan: SNF vs CIR    Consultants: Dr.Robert Buccini [facility]) Dr.Burke Grandville Silos (Trauma surgery) Dr.Aaron Ancil [facility]) Dr Shaune Pascal Bensimhon (cardiology).   Procedure/Significant Events: 9/12 FLEXIBLE SIGMOIDOSCOPY ;Severe chronic inflammatory changes in the proximal sigmoid and descending colon 9/16 Emergent exploratory laparoscopy; perforated descending colon, colitis descending/sigmoid colon 9/18 Exploratory Laparoscopy, reexploration of abdomen with closure and mobilization of splenic flexure; creation of colostomy 9/25 echocardiogram;Left ventricle: moderately dilated. -mild LVH. -LVEF=  20%. Diffuse hypokinesis.  - Pulmonary arteries: PA peak pressure: 40 mm Hg (S) + RAP. - Pericardium, A trivial pericardial effusion -9/25 Transfuse one unit PRBC -9/29 Transfuse one unit PRBC  Culture Urine 9/10 >> negative C diff 9/13 >> negative 9/16 Peritoneal fluid negative   Antibiotics: Ceftriaxone x1 >> 9/10 Flagyl 9/11 >> 9/13 Flagyl 9/16 >> stopped 9/23 Cipro 9/16 >> stopped 9/23  - Per notes has been on cipro + flagyl as an outpt as well   DVT  prophylaxis: Heparin   Devices    LINES / TUBES:  ETT 06/09/15 Art Line, 06/09/15 R IJ CVC 06/09/15 NGT 06/09/15 Foley 06/09/15     Continuous Infusions:    Objective: VITAL SIGNS: Temp: 98.6 F (37 C) (09/29 1152) Temp Source: Oral (09/29 1152) BP: 106/75 mmHg (09/29 1200) Pulse Rate: 109 (09/29 1200) SPO2; FIO2:   Intake/Output Summary (Last 24 hours) at 06/22/15 1406 Last data filed at 06/22/15 0800  Gross per 24 hour  Intake    480 ml  Output   2100 ml  Net  -1620 ml     Exam: General: A/O 4, morbidly obese, No acute respiratory distress Eyes: Negative headache, negative scleral hemorrhage ENT: Negative Runny nose, negative ear pain, negative gingival bleeding, Neck:  Negative scars, masses, torticollis, lymphadenopathy, JVD Lungs: Clear to auscultation bilaterally without wheezes or crackles Cardiovascular: Tachycardic (improved from 9/25), Regular rhythm without murmur gallop or rub normal S1 and S2 Abdomen: Positive Abdominal pain (appropriate for recent surgery), wound VAC in place, morbidly obese, positive soft, bowel sounds, midline vertical incision covered and clean, good granulation tissue, negative sign of infection, ostomy bag in place LLQ negative sign of blood, negative leakage. no rebound, no ascites, no appreciable mass Extremities: No significant cyanosis, clubbing, or edema bilateral lower extremities Psychiatric:  Negative depression, negative anxiety, negative fatigue, negative mania Neurologic:  Cranial nerves II through XII intact, tongue/uvula midline, all extremities muscle strength 5/5, sensation intact throughout, negative dysarthria, negative expressive aphasia, negative receptive aphasia.   Data Reviewed: Basic Metabolic Panel:  Recent Labs Lab 06/16/15 0350 06/17/15 0500 06/17/15 0814 06/18/15 0330 06/19/15 0300 06/20/15 0451 06/20/15 0948 06/22/15 0358  NA 132* 135  --  135 138 134*  --  137  K 4.7 4.4  --  4.5 4.6 5.0   --  4.4  CL 105 105  --  110 111 107  --  108  CO2 21* 21*  --  19* 21* 21*  --  24  GLUCOSE 99 98  --  107* 109* 112*  --  92  BUN 45* 49*  --  46* 45* 44*  --  39*  CREATININE 2.75* 2.60*  --  2.31* 2.03* 1.83*  --  1.53*  CALCIUM 7.2* 7.5*  --  7.6* 8.1* 8.0*  --  8.2*  MG  --   --  1.8 1.9 2.0  --  2.1  --   PHOS 5.9*  --  4.7* 4.5 4.1  --   --   --    Liver Function Tests:  Recent Labs Lab 06/16/15 0350 06/18/15 0330 06/19/15 0300 06/20/15 0451  AST  --  13* 15 17  ALT  --  16 16 16   ALKPHOS  --  107 101 81  BILITOT  --  1.2 1.0 0.8  PROT  --  5.6* 5.8* 5.6*  ALBUMIN 1.3* 1.5* 1.7* 1.7*   No results for input(s): LIPASE, AMYLASE in the last 168 hours. No results for input(s): AMMONIA in the last 168 hours. CBC:  Recent Labs Lab 06/17/15 0500 06/18/15 0330 06/19/15 0300 06/20/15 0451 06/22/15 0358  WBC 24.5* 22.7* 27.4* 20.2* 13.6*  NEUTROABS  --  20.4* 25.5*  --   --   HGB 7.8* 7.5* 8.4* 7.8* 7.6*  HCT 23.9* 23.7* 27.6* 26.3* 26.7*  MCV 62.2* 64.1* 69.2* 72.1* 73.6*  PLT 241 256 412* 396 338   Cardiac Enzymes:  Recent Labs Lab 06/17/15 1508 06/17/15 2000 06/18/15 1403 06/18/15 2015 06/19/15 0300  TROPONINI 0.03 0.09* 0.11* 0.10* 0.10*   BNP (last  3 results) No results for input(s): BNP in the last 8760 hours.  ProBNP (last 3 results) No results for input(s): PROBNP in the last 8760 hours.  CBG:  Recent Labs Lab 06/21/15 1630 06/21/15 2018 06/22/15 0008 06/22/15 0357 06/22/15 0738  GLUCAP 94 103* 97 88 100*    Recent Results (from the past 240 hour(s))  MRSA PCR Screening     Status: None   Collection Time: 06/13/15 11:15 AM  Result Value Ref Range Status   MRSA by PCR NEGATIVE NEGATIVE Final    Comment:        The GeneXpert MRSA Assay (FDA approved for NASAL specimens only), is one component of a comprehensive MRSA colonization surveillance program. It is not intended to diagnose MRSA infection nor to guide or monitor treatment  for MRSA infections.   Culture, Urine     Status: None   Collection Time: 06/18/15  3:30 AM  Result Value Ref Range Status   Specimen Description URINE, CATHETERIZED  Final   Special Requests NONE  Final   Culture >=100,000 COLONIES/mL LACTOBACILLUS SPECIES  Final   Report Status 06/19/2015 FINAL  Final     Studies:  Recent x-ray studies have been reviewed in detail by the Attending Physician  Scheduled Meds:  Scheduled Meds: . sodium chloride   Intravenous Once  . antiseptic oral rinse  7 mL Mouth Rinse BID  . feeding supplement (ENSURE ENLIVE)  237 mL Oral BID BM  . feeding supplement (PRO-STAT SUGAR FREE 64)  30 mL Oral BID  . furosemide  40 mg Oral BID  . heparin subcutaneous  5,000 Units Subcutaneous 3 times per day  . Influenza vac split quadrivalent PF  0.5 mL Intramuscular Tomorrow-1000  . insulin aspart  0-20 Units Subcutaneous 6 times per day  . mesalamine  500 mg Oral QID  . metoCLOPramide  5 mg Oral TID AC  . metoprolol tartrate  25 mg Oral BID  . pantoprazole  40 mg Oral Daily  . pneumococcal 13-valent conjugate vaccine  0.5 mL Intramuscular Tomorrow-1000  . saccharomyces boulardii  250 mg Oral BID    Time spent on care of this patient: 40 mins   WOODS, Geraldo Docker , MD  [facility] Office  [phone] Pager - [phone]  On-Call/Text Page:      [email or web address]      password [email or web address]  If 7PM-7AM, please contact night-coverage [email or web address] Password [email or web address] 06/22/2015, 2:06 PM   LOS: 18 days   Care during the described time interval was provided by me .  I have reviewed this patient's available data, including medical history, events of note, physical examination, and all test results as part of my evaluation. I have personally reviewed and interpreted all radiology studies.   Dia Crawford, MD [phone] Pager

## 2015-06-22 NOTE — Progress Notes (Signed)
Subjective:  No CP/SOB, BP stable  Objective:  Temp:  [97.7 F (36.5 C)-98.4 F (36.9 C)] 97.7 F (36.5 C) (09/29 0735) Pulse Rate:  [76-114] 102 (09/29 0800) Resp:  [12-36] 12 (09/29 0800) BP: (90-126)/(48-83) 111/83 mmHg (09/29 0800) SpO2:  [97 %-100 %] 100 % (09/29 0800) Weight change:   Intake/Output from previous day: 09/28 0701 - 09/29 0700 In: 840 [P.O.:840] Out: 2000 [Urine:1150; Drains:350; Stool:500]  Intake/Output from this shift: Total I/O In: -  Out: 300 [Urine:250; Stool:50]  Physical Exam: General appearance: alert and no distress Neck: no adenopathy, no carotid bruit, no JVD, supple, symmetrical, trachea midline and thyroid not enlarged, symmetric, no tenderness/mass/nodules Lungs: clear to auscultation bilaterally Heart: regular rate and rhythm, S1, S2 normal, no murmur, click, rub or gallop Extremities: extremities normal, atraumatic, no cyanosis or edema  Lab Results: Results for orders placed or performed during the hospital encounter of 06/03/15 (from the past 48 hour(s))  Glucose, capillary     Status: Abnormal   Collection Time: 06/20/15 11:42 AM  Result Value Ref Range   Glucose-Capillary 126 (H) 65 - 99 mg/dL   Comment 1 Notify RN    Comment 2 Document in Chart   Glucose, capillary     Status: None   Collection Time: 06/20/15  3:43 PM  Result Value Ref Range   Glucose-Capillary 92 65 - 99 mg/dL  Glucose, capillary     Status: None   Collection Time: 06/20/15  4:32 PM  Result Value Ref Range   Glucose-Capillary 98 65 - 99 mg/dL   Comment 1 Notify RN    Comment 2 Document in Chart   Glucose, capillary     Status: None   Collection Time: 06/20/15  8:50 PM  Result Value Ref Range   Glucose-Capillary 96 65 - 99 mg/dL  Glucose, capillary     Status: Abnormal   Collection Time: 06/21/15 12:05 AM  Result Value Ref Range   Glucose-Capillary 102 (H) 65 - 99 mg/dL  Glucose, capillary     Status: None   Collection Time: 06/21/15  4:16 AM   Result Value Ref Range   Glucose-Capillary 76 65 - 99 mg/dL  Glucose, capillary     Status: Abnormal   Collection Time: 06/21/15  5:43 AM  Result Value Ref Range   Glucose-Capillary 102 (H) 65 - 99 mg/dL  Glucose, capillary     Status: None   Collection Time: 06/21/15  8:07 AM  Result Value Ref Range   Glucose-Capillary 96 65 - 99 mg/dL   Comment 1 Notify RN    Comment 2 Document in Chart   Glucose, capillary     Status: Abnormal   Collection Time: 06/21/15 12:17 PM  Result Value Ref Range   Glucose-Capillary 110 (H) 65 - 99 mg/dL  Glucose, capillary     Status: None   Collection Time: 06/21/15  4:30 PM  Result Value Ref Range   Glucose-Capillary 94 65 - 99 mg/dL   Comment 1 Notify RN    Comment 2 Document in Chart   Glucose, capillary     Status: Abnormal   Collection Time: 06/21/15  8:18 PM  Result Value Ref Range   Glucose-Capillary 103 (H) 65 - 99 mg/dL  Glucose, capillary     Status: None   Collection Time: 06/22/15 12:08 AM  Result Value Ref Range   Glucose-Capillary 97 65 - 99 mg/dL  Glucose, capillary     Status: None   Collection Time:  06/22/15  3:57 AM  Result Value Ref Range   Glucose-Capillary 88 65 - 99 mg/dL  Basic metabolic panel     Status: Abnormal   Collection Time: 06/22/15  3:58 AM  Result Value Ref Range   Sodium 137 135 - 145 mmol/L   Potassium 4.4 3.5 - 5.1 mmol/L   Chloride 108 101 - 111 mmol/L   CO2 24 22 - 32 mmol/L   Glucose, Bld 92 65 - 99 mg/dL   BUN 39 (H) 6 - 20 mg/dL   Creatinine, Ser 1.53 (H) 0.44 - 1.00 mg/dL   Calcium 8.2 (L) 8.9 - 10.3 mg/dL   GFR calc non Af Amer 43 (L) >60 mL/min   GFR calc Af Amer 50 (L) >60 mL/min    Comment: (NOTE) The eGFR has been calculated using the CKD EPI equation. This calculation has not been validated in all clinical situations. eGFR's persistently <60 mL/min signify possible Chronic Kidney Disease.    Anion gap 5 5 - 15  CBC     Status: Abnormal   Collection Time: 06/22/15  3:58 AM  Result  Value Ref Range   WBC 13.6 (H) 4.0 - 10.5 K/uL   RBC 3.63 (L) 3.87 - 5.11 MIL/uL   Hemoglobin 7.6 (L) 12.0 - 15.0 g/dL   HCT 26.7 (L) 36.0 - 46.0 %   MCV 73.6 (L) 78.0 - 100.0 fL   MCH 20.9 (L) 26.0 - 34.0 pg   MCHC 28.5 (L) 30.0 - 36.0 g/dL   RDW 26.1 (H) 11.5 - 15.5 %   Platelets 338 150 - 400 K/uL    Imaging: Imaging results have been reviewed  Assessment/Plan:   1. Principal Problem: 2.   Colitis 3. Active Problems: 4.   Morbid obesity- BMI 67.5 5.   Iron deficiency anemia 6.   Pressure ulcer 7.   Acute respiratory failure with hypoxia 8.   Crohn's disease 9.   Perforated sigmoid colon 10.   Peritonitis 11.   Peritoneal abscess 12.   OSA on CPAP 13.   Obesity hypoventilation syndrome 14.   Acute renal insufficiency 15.   Acute encephalopathy 16.   Pulmonary hypertension 17.   Cardiomyopathy- etiology not yet determined 18.   Time Spent Directly with Patient:  15 minutes  Length of Stay:  LOS: 18 days   BP stable. Can give BB, Imdur and diuretic. I/O + 33L. EF 20% by recent TTE. Lungs clear. Cor mildly tachy. No evid of overt CHF. Can get OOB to chair and/or have PT from our point of view. LV dysfunction should not limit this activity to mobilize. No change in Rx. Will see again on Monday.   Quay Burow 06/22/2015, 10:02 AM

## 2015-06-22 NOTE — Progress Notes (Signed)
Rehab admissions - I spoke with PT and I spoke with patient.  Patient needs to be more fully participatory with therapies.  I told her that I would like to see her out of bed, stand and then into chair.  Then, once she is medically ready, we could admit to inpatient rehab.  Call me for questions.  #[phone]

## 2015-06-22 NOTE — Progress Notes (Signed)
Patient has refused CPAP at this time. Patient was educated on use of CPAP and the benefit of it and told to call us at any time if she felt SOB. RT will continue to monitor.

## 2015-06-23 ENCOUNTER — Inpatient Hospital Stay (HOSPITAL_COMMUNITY): Payer: Medicaid Other

## 2015-06-23 LAB — CBC WITH DIFFERENTIAL/PLATELET
Basophils Absolute: 0 10*3/uL (ref 0.0–0.1)
Basophils Relative: 0 %
EOS PCT: 2 %
Eosinophils Absolute: 0.3 10*3/uL (ref 0.0–0.7)
HEMATOCRIT: 28.9 % — AB (ref 36.0–46.0)
HEMOGLOBIN: 8.5 g/dL — AB (ref 12.0–15.0)
LYMPHS PCT: 5 %
Lymphs Abs: 0.6 10*3/uL — ABNORMAL LOW (ref 0.7–4.0)
MCH: 22 pg — ABNORMAL LOW (ref 26.0–34.0)
MCHC: 29.4 g/dL — ABNORMAL LOW (ref 30.0–36.0)
MCV: 74.7 fL — AB (ref 78.0–100.0)
MONOS PCT: 6 %
Monocytes Absolute: 0.8 10*3/uL (ref 0.1–1.0)
Neutro Abs: 11 10*3/uL — ABNORMAL HIGH (ref 1.7–7.7)
Neutrophils Relative %: 87 %
Platelets: 281 10*3/uL (ref 150–400)
RBC: 3.87 MIL/uL (ref 3.87–5.11)
RDW: 26.5 % — AB (ref 11.5–15.5)
WBC: 12.7 10*3/uL — AB (ref 4.0–10.5)

## 2015-06-23 LAB — TYPE AND SCREEN
ABO/RH(D): O POS
Antibody Screen: NEGATIVE
Unit division: 0

## 2015-06-23 LAB — GLUCOSE, CAPILLARY
GLUCOSE-CAPILLARY: 92 mg/dL (ref 65–99)
Glucose-Capillary: 91 mg/dL (ref 65–99)
Glucose-Capillary: 91 mg/dL (ref 65–99)
Glucose-Capillary: 79 mg/dL (ref 65–99)
Glucose-Capillary: 92 mg/dL (ref 65–99)

## 2015-06-23 LAB — PREALBUMIN: Prealbumin: 9.1 mg/dL — ABNORMAL LOW (ref 18–38)

## 2015-06-23 MED ORDER — ENSURE ENLIVE PO LIQD
237.0000 mL | Freq: Three times a day (TID) | ORAL | Status: DC
Start: 2015-06-23 — End: 2015-06-26
  Administered 2015-06-23 – 2015-06-26 (×6): 237 mL via ORAL

## 2015-06-23 NOTE — Progress Notes (Signed)
Physical Therapy Treatment Patient Details Name: Dominique [name] MRN: [id number] DOB: Nov 13, 1980 Today's Date: 06/23/2015    History of Present Illness Adm 9/10 for colitis 9/16 detected perforated descending colon >>laparotomy, drain of abscess, partial colectomy, wound vac; remained on vent post op 9/18 To OR >> wound closed, colostomy created 9/21 Extubated, off pressors    PT Comments    Progressing slowly.  Emphasis on sitting EOB, starting standing trials and getting via lift to Bariatric chair.  Follow Up Recommendations  CIR     Equipment Recommendations  Rolling walker with 5" wheels    Recommendations for Other Services       Precautions / Restrictions Precautions Precautions: Fall Precaution Comments: abdominal wound vac, colostomy Required Braces or Orthoses: Other Brace/Splint Other Brace/Splint: abd binder when up Restrictions Weight Bearing Restrictions: No    Mobility  Bed Mobility Overal bed mobility: Needs Assistance Bed Mobility: Rolling;Supine to Sit;Sit to Supine Rolling: Max assist;+2 for physical assistance   Supine to sit: +2 for physical assistance;Total assist (Pt does try to help, but due to her body habitus and the bed she is in it is diffcult for her to help) Sit to supine: Total assist (+3 (one for her trunk and one for each leg))      Transfers Overall transfer level: Needs assistance   Transfers: Sit to/from Stand Sit to Stand: Total assist;+2 physical assistance         General transfer comment: Attempted sit<>stand x2 with bariatric chair in front of her to let her try and use her arms on the poles of the chair to help stand up and bed raised. Unable to get any clearance off of the bed (pt very fearful of falling). Ended up get pt up with maxi move to baritric recliner  Ambulation/Gait                 Stairs            Wheelchair Mobility    Modified Rankin (Stroke Patients Only)       Balance Overall balance  assessment: Needs assistance Sitting-balance support: Feet supported;Bilateral upper extremity supported Sitting balance-Leahy Scale: Fair Sitting balance - Comments: sat x 20 minutes       Standing balance comment: unable to stand after 2 trials, may have to try tilt table.                    Cognition Arousal/Alertness: Awake/alert Behavior During Therapy: WFL for tasks assessed/performed Overall Cognitive Status: Within Functional Limits for tasks assessed                      Exercises      General Comments        Pertinent Vitals/Pain Pain Assessment: Faces Faces Pain Scale: Hurts little more Pain Location: abdomen Pain Descriptors / Indicators: Grimacing Pain Intervention(s): Monitored during session    Home Living                      Prior Function            PT Goals (current goals can now be found in the care plan section) Acute Rehab PT Goals Patient Stated Goal: return home to 34 yo daughter PT Goal Formulation: With patient Time For Goal Achievement: 06/29/15 Potential to Achieve Goals: Good Progress towards PT goals: Progressing toward goals    Frequency  Min 3X/week    PT Plan Current plan remains appropriate  Co-evaluation PT/OT/SLP Co-Evaluation/Treatment: Yes Reason for Co-Treatment: Complexity of the patient's impairments (multi-system involvement);For patient/therapist safety PT goals addressed during session: Mobility/safety with mobility OT goals addressed during session: ADL's and self-care;Strengthening/ROM     End of Session Equipment Utilized During Treatment:  (hover mat, maxi lift) Activity Tolerance: Patient tolerated treatment well Patient left: in chair;with call bell/phone within reach     Time: 1120-1250 PT Time Calculation (min) (ACUTE ONLY): 90 min  Charges:  $Therapeutic Activity: 38-52 mins                    G Codes:      Mottinger, Dominique [name] 06/23/2015, 5:18 PM 06/23/2015  Dominique [name], PT [phone]  (pager)

## 2015-06-23 NOTE — Progress Notes (Signed)
Pt transfer from [place] morbid obesity alert and oriented, with colostomy, mid abdomen dressing done today, room air, with right IJ capped,  4 person assist , no s/s of distress noted.

## 2015-06-23 NOTE — Progress Notes (Signed)
Nutrition Follow-up  DOCUMENTATION CODES:   Severe malnutrition in context of acute illness/injury, Morbid obesity  INTERVENTION:    D/C Prostat, patient will not take  Continue Ensure Enlive TID between meals when diet advanced  NUTRITION DIAGNOSIS:   Increased nutrient needs related to wound healing as evidenced by estimated needs.  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 Pt with recurrent colitis.  Patient currently on clear liquids. Plans for CT abdomen this afternoon, concern for abdominal wound infection with lots of thick drainage. Wound VAC being d/c'ed; patient to receive wet-dry dressing changes. Previously on a soft diet with inadequate oral intake. She does not like the Prostat supplement. She likes chocolate Ensure.   Diet Order:  Diet clear liquid Room service appropriate?: Yes; Fluid consistency:: Thin  Skin:  Wound (see comment) (DTI to nose; Abd wound)  Last BM:  9/29 (ostomy)  Height:   Ht Readings from Last 1 Encounters:  06/03/15 5' 2"  (1.575 m)    Weight:   Wt Readings from Last 1 Encounters:  06/03/15 368 lb 3 oz (167.009 kg)    Ideal Body Weight:  50 kg  BMI:  Body mass index is 67.33 kg/(m^2).  Estimated Nutritional Needs:   Kcal:  2200-2400  Protein:  120-130 gm  Fluid:  2.2-2.4 L  EDUCATION NEEDS:   Education needs no appropriate at this time  Molli Barrows, [place], [place], [place] Pager [phone] After Hours Pager [phone]

## 2015-06-23 NOTE — Progress Notes (Signed)
Patient to transfer to 6N05 report given to receiving nurse all questions answered at this time.  Pt. VSS with no s/s of distress noted.  Patient stable at transfer.

## 2015-06-23 NOTE — Progress Notes (Signed)
Occupational Therapy Treatment Patient Details Name: Dominique [name] MRN: [id number] DOB: Nov 15, 1980 Today's Date: 06/23/2015    History of present illness Adm 9/10 for colitis 9/16 detected perforated descending colon >>laparotomy, drain of abscess, partial colectomy, wound vac; remained on vent post op 9/18 To OR >> wound closed, colostomy created 9/21 Extubated, off pressors   OT comments  This 34 yo female admitted with above presents to acute OT with continued generalized weakness, decreased mobility, decreased balance; however able to sit EOB increased period of time and did attempt sit>stand x2 today as well able to get up in bariatric recliner for first time with maxi move. Will continue to benefit from acute OT to work on increasing strength.  Follow Up Recommendations  CIR    Equipment Recommendations   (TBD next venue)       Precautions / Restrictions Precautions Precautions: Fall Precaution Comments: abdominal wound vac, colostomy Required Braces or Orthoses: Other Brace/Splint Other Brace/Splint: abd binder when up Restrictions Weight Bearing Restrictions: No       Mobility Bed Mobility Overal bed mobility: Needs Assistance Bed Mobility: Rolling;Supine to Sit;Sit to Supine Rolling: Max assist   Supine to sit: +2 for physical assistance;Total assist (Pt does try to help, but due to her body habitus and the bed she is in it is diffcult for her to help) Sit to supine: Total assist (+3 (one for her trunk and one for each leg))      Transfers Overall transfer level: Needs assistance   Transfers: Sit to/from Stand           General transfer comment: Attempted sit<>stand x2 with bariatric chair in front of her to let her try and use her arms on the poles of the chair to help stand up and bed raised. Unable to get any clearance off of the bed (pt very fearful of falling). Ended up get pt up with maxi move to baritric recliner    Balance Overall balance assessment:  Needs assistance Sitting-balance support: Feet supported;Bilateral upper extremity supported Sitting balance-Leahy Scale: Fair Sitting balance - Comments: sat x 20 minutes                           ADL                                     General ADL Comments: Pt total A for pericare and colostomy care. Colstomy leaked while we were helping her to roll to place maxi move pad. Nursing and and we A'd her with getting pt cleaned up and new colostomy bag on.                Cognition   Behavior During Therapy: WFL for tasks assessed/performed Overall Cognitive Status: Within Functional Limits for tasks assessed                                    Pertinent Vitals/ Pain       Pain Assessment: No/denies pain         Frequency Min 3X/week     Progress Toward Goals  OT Goals(current goals can now be found in the care plan section)  Progress towards OT goals: Progressing toward goals (slowly)     Plan Discharge plan remains appropriate    Co-evaluation  PT/OT/SLP Co-Evaluation/Treatment: Yes Reason for Co-Treatment: Complexity of the patient's impairments (multi-system involvement);For patient/therapist safety   OT goals addressed during session: ADL's and self-care;Strengthening/ROM      End of Session Equipment Utilized During Treatment:  (none)   Activity Tolerance Patient tolerated treatment well   Patient Left in chair;with call bell/phone within reach   Nurse Communication Mobility status;Need for lift equipment        Time: 1982-4299 OT Time Calculation (min): 91 min  Charges: OT General Charges $OT Visit: 1 Procedure OT Treatments $Therapeutic Activity: 38-52 mins  Almon Register [phone] 06/23/2015, 3:50 PM

## 2015-06-23 NOTE — Consult Note (Addendum)
WOC follow-up: Ostomy pouching: Current pouch is leaking. Applied 2 piece pouch with barrier ring. Stoma is pale and tan with slough, slightly above skin level, 1 3/4 inches, mucutaneous separation occurring to peristomal edges from 7:00 o'clock to 12:00 o'clockand slight amt thick reddish-tan drainage which appears to be pus drains when wafer is pressed. CCS assessed stoma and discussed plan of care.  It will be difficult to maintain pouch seal for more than 24 hours due to drainage behind the wafer. Mod amt formed brown stool in the pouch Education provided: Demonstrated pouch change using 2 piece pouch and a barrier ring to attempt to maintain seal. Applied narrow strip of Aquacel packing into separation edges of stoma. Enrolled patient in [place] program: No Ostomy supplies ordered to the room for bedside nurse use. No family in the room and pt is very tired and does not ask questions. Will resume teaching sessions when stable and out of ICU.   Wound type: Full thickness midline abd wound, CCS at bedside to assess wound during Vac dressing change Wound bed: 90% beefy red, 10% yellow Drainage (amount, consistency, odor) Mod amt thick tan drainage in cannister and from middle abd wound when probed, no odor Periwound: Intact skin surrounding Dressing procedure/placement/frequency: Applied 4 pieces black foam to cont suction at 181m, 2 pieces tucked deeper into middle of wound where drainage seems to be occurring. Pt tolerated with mod amt discomfort after pain meds given earlier. Plan for WOC to change Q M/W/F. If Vac becomes blocked with thick tan drainage, then CCS plans to D/C Vac and begin moist gauze dressings.,  DJulien GirtMSN, [place]

## 2015-06-23 NOTE — Progress Notes (Signed)
Physical Therapy Treatment Patient Details Name: Dominique [name] MRN: [id number] DOB: [birth year] Today's Date: 06/23/2015    History of Present Illness Adm 9/10 for colitis 9/16 detected perforated descending colon >>laparotomy, drain of abscess, partial colectomy, wound vac; remained on vent post op 9/18 To OR >> wound closed, colostomy created 9/21 Extubated, off pressors    PT Comments    Pt tolerated approximately 1 hour in the bariatric chair   Follow Up Recommendations  CIR     Equipment Recommendations  Rolling walker with 5" wheels    Recommendations for Other Services       Precautions / Restrictions Precautions Precautions: Fall Precaution Comments: abdominal wound vac, colostomy Required Braces or Orthoses: Other Brace/Splint Other Brace/Splint: abd binder when up Restrictions Weight Bearing Restrictions: No    Mobility  Bed Mobility Overal bed mobility: Needs Assistance Bed Mobility: Rolling Rolling: Max assist;+2 for physical assistance   Supine to sit: +2 for physical assistance;Total assist (Pt does try to help, but due to her body habitus and the bed she is in it is diffcult for her to help) Sit to supine: Total assist (+3 (one for her trunk and one for each leg))   General bed mobility comments: Rolling multiple times to get on/off the bed pan  Transfers Overall transfer level: Needs assistance   Transfers: Sit to/from Stand Sit to Stand: Total assist;+2 physical assistance         General transfer comment: Used maximove to transfer back to bed for toileting.  pt used UE to assist minimally upper trunk during fastening and unfastening clips.  Ambulation/Gait                 Stairs            Wheelchair Mobility    Modified Rankin (Stroke Patients Only)       Balance Overall balance assessment: Needs assistance Sitting-balance support: Feet supported;Bilateral upper extremity supported Sitting balance-Leahy Scale:  Fair Sitting balance - Comments: sat x 20 minutes       Standing balance comment: unable to stand after 2 trials, may have to try tilt table.                    Cognition Arousal/Alertness: Awake/alert Behavior During Therapy: WFL for tasks assessed/performed Overall Cognitive Status: Within Functional Limits for tasks assessed                      Exercises      General Comments General comments (skin integrity, edema, etc.): pt tolerated about an hour sitting in the chair, mostly limited by need to pee.      Pertinent Vitals/Pain Pain Assessment: No/denies pain Faces Pain Scale: Hurts little more Pain Location: abdomen Pain Descriptors / Indicators: Grimacing Pain Intervention(s): Monitored during session    Home Living                      Prior Function            PT Goals (current goals can now be found in the care plan section) Acute Rehab PT Goals Patient Stated Goal: return home to 61 yo daughter PT Goal Formulation: With patient Time For Goal Achievement: 06/29/15 Potential to Achieve Goals: Good Progress towards PT goals: Progressing toward goals    Frequency  Min 3X/week    PT Plan Current plan remains appropriate    Co-evaluation PT/OT/SLP Co-Evaluation/Treatment: Yes Reason for Co-Treatment: Complexity of the  patient's impairments (multi-system involvement);For patient/therapist safety PT goals addressed during session: Mobility/safety with mobility OT goals addressed during session: ADL's and self-care;Strengthening/ROM     End of Session Equipment Utilized During Treatment:  (hover mat, maxi lift) Activity Tolerance: Patient tolerated treatment well Patient left: in bed;with call bell/phone within reach;with bed alarm set;with SCD's reapplied     Time: 1345-1412 PT Time Calculation (min) (ACUTE ONLY): 27 min  Charges:  $Therapeutic Activity: 23-37 mins                    G Codes:      Mottinger, Tessie Fass 06/23/2015, 5:26 PM  06/23/2015  Donnella Sham, PT [phone]  (pager)

## 2015-06-23 NOTE — Progress Notes (Signed)
[facility] Progress Note  12 Days Post-Op  Subjective: Pt's mood much improved.  No N/V, appetite improving.  Motivated to get up and move today.  Wants to go to rehab.  Urinating well.  Having good BM's and flatus.  Objective: Vital signs in last 24 hours: Temp:  [97.7 F (36.5 C)-98.6 F (37 C)] 98.6 F (37 C) (09/30 0400) Pulse Rate:  [98-115] 103 (09/30 0400) Resp:  [12-27] 15 (09/30 0600) BP: (106-128)/(53-94) 128/92 mmHg (09/30 0600) SpO2:  [99 %-100 %] 100 % (09/30 0400) Last BM Date: 06/22/15  Intake/Output from previous day: 09/29 0701 - 09/30 0700 In: 825 [P.O.:480; Blood:345] Out: 3215 [Urine:2890; Drains:200; Stool:125] Intake/Output this shift:    PE: Gen:  Alert, NAD, pleasant Abd: Obese, soft, ND, mild tenderness, +BS, no HSM, midline wound vac in place with cloudy serosanguinous drainage in vac canister.  Ostomy still reddish brown, great stool and flatus output   Lab Results:   Recent Labs  06/22/15 0358  WBC 13.6*  HGB 7.6*  HCT 26.7*  PLT 338   BMET  Recent Labs  06/22/15 0358  NA 137  K 4.4  CL 108  CO2 24  GLUCOSE 92  BUN 39*  CREATININE 1.53*  CALCIUM 8.2*   PT/INR No results for input(s): LABPROT, INR in the last 72 hours. CMP     Component Value Date/Time   NA 137 06/22/2015 0358   K 4.4 06/22/2015 0358   CL 108 06/22/2015 0358   CO2 24 06/22/2015 0358   GLUCOSE 92 06/22/2015 0358   BUN 39* 06/22/2015 0358   CREATININE 1.53* 06/22/2015 0358   CREATININE 0.52 12/29/2014 1557   CALCIUM 8.2* 06/22/2015 0358   PROT 5.6* 06/20/2015 0451   ALBUMIN 1.7* 06/20/2015 0451   AST 17 06/20/2015 0451   ALT 16 06/20/2015 0451   ALKPHOS 81 06/20/2015 0451   BILITOT 0.8 06/20/2015 0451   GFRNONAA 43* 06/22/2015 0358   GFRAA 50* 06/22/2015 0358   Lipase     Component Value Date/Time   LIPASE 10* 06/09/2015 1325       Studies/Results: No results found.  Anti-infectives: Anti-infectives    Start      Dose/Rate Route Frequency Ordered Stop   06/14/15 1800  ciprofloxacin (CIPRO) IVPB 200 mg     200 mg 100 mL/hr over 60 Minutes Intravenous Every 12 hours 06/14/15 1438 06/16/15 1834   06/10/15 1800  ciprofloxacin (CIPRO) IVPB 400 mg  Status:  Discontinued     400 mg 200 mL/hr over 60 Minutes Intravenous Every 12 hours 06/10/15 1734 06/14/15 1438   06/09/15 1927  ciprofloxacin (CIPRO) 400 MG/200ML IVPB    Comments:  Ubaldo Glassing   : cabinet override      06/09/15 1927 06/09/15 1953   06/09/15 1830  ciprofloxacin (CIPRO) IVPB 400 mg  Status:  Discontinued     400 mg 200 mL/hr over 60 Minutes Intravenous  Once 06/09/15 1825 06/10/15 1734   06/09/15 1830  metroNIDAZOLE (FLAGYL) IVPB 500 mg     500 mg 100 mL/hr over 60 Minutes Intravenous Every 8 hours 06/09/15 1825 06/16/15 1930   06/04/15 1200  metroNIDAZOLE (FLAGYL) IVPB 500 mg  Status:  Discontinued     500 mg 100 mL/hr over 60 Minutes Intravenous Every 8 hours 06/04/15 1034 06/07/15 1405   06/04/15 0015  cefTRIAXone (ROCEPHIN) 1 g in dextrose 5 % 50 mL IVPB     1 g 100 mL/hr over 30 Minutes Intravenous  Once  06/04/15 0012 06/04/15 0108     Procedure/Significant Events: 9/12 Flexible Sigmoidoscopy ;Severe chronic inflammatory changes in the proximal sigmoid and descending colon 9/16 Emergent exploratory laparoscopy; perforated descending colon, colitis descending/sigmoid colon 9/18 Exploratory Laparoscopy, reexploration of abdomen with closure and mobilization of splenic flexure; creation of colostomy 9/25 echocardiogram;Left ventricle: moderately dilated. -mild LVH. -LVEF= 20%. Diffuse hypokinesis.  - Pulmonary arteries: PA peak pressure: 40 mm Hg (S) + RAP. - Pericardium, A trivial pericardial effusion  Assessment/Plan Perforated descending colon, colitis POD 12,14 s/p ex lap with DRAINAGE OF INTRA-ABDOMINAL ABSCESS, PARTIAL COLON RESECTION, re-exploration and creation of colostomy and abdominal closure - Tolerating soft  diet - Still in SDU, can go to floor from surgical perspective - Pt continuing to progress with PT - Inpatient rehab when medically stable - Wound vac in place, MWF, VAC change today 9/30 - WBC 13.6 yest. and trending down. Finished post-op 7-day course of cipro/flagyl on 9/23. Final body fluid culture report: no growth to date.  No fevers.  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

## 2015-06-23 NOTE — Progress Notes (Signed)
Dressing change at bedside.  Ostomy has significant mucocutaneous separation, and purulent sanguinous drainage from around ostomy and in wound.  Inferior aspect of wound has large amounts of pooling in the inferior aspect.  We replaced the vac with some adjustments, but suspect the [facility] will clog.  After talking with Dr. Hulen Skains, will give her a rest and switch to WD dressings this afternoon.  The wound will need to be packed deeply to avoid pooling of drainage.  WD dressing changes BID.  Will obtain CT scan to check for fluid collections/intra-abdominal abscesses.        Jomarie Longs, PA-C General Surgery [facility] [phone]

## 2015-06-23 NOTE — Progress Notes (Signed)
[facility] TEAM 1 - Stepdown/ICU TEAM PROGRESS NOTE  Dominique [name] DUK:[id number] DOB: 06/08/1981 DOA: 06/03/2015 PCP: Benito Mccreedy, MD  Admit HPI / Brief Narrative: 34 year old F Hx Asthma, OSA/OHS, Hepatic Steatosis, and Recurrent Colitis since Jan 2016 who presented to the ED with severe abdominal pain with diarrhea and occasional vomiting. She had not been able to see GI due to insurance and referral issues. When her sx became worse than her usual, she presented to the ED.    HPI/Subjective: The patient is sitting up on the side of her bed working with physical therapy.  She complains of feeling severely weak in her legs but denies chest pain shortness of breath nausea vomiting.  Assessment/Plan:  Newly diagnosed Crohn's disease   -S/P 9/12 flexible sigmoidoscopy w/ bx consistent with Crohn's disease -Pentasa 500 QID per GI recs  -stopped steroids as no role presently per GI opinion   Sepsis due to Perforated left colon w/ Peritonitis and Peritoneal abscess -9/16 patient diagnosed with perforated left colon from Crohn's colitis -Emergent exploratory laparoscopy 9/16 w/ partial colon resection and drainage of intra-abdominal abscess -Returned to OR 9/18 for reexploration of abdomen with closure, and mobilization of the splenic flexure with creation of colostomy -ongoing care per Gen Surgery - there is some concern about her ostomy    Persistent Sinus tachycardia  -no evidence of PE on angiogram 9/16 - TSH normal - likely related to severe CHF - cont scheduled BB  Severe systolic CHF - ?newly diagnosed  -EF 20% per TTE 06/18/15 w/ diffuse hypokinesis - felt to be critical care CM and likely to resolve - Cardiology following - cont diuresis as able (replace foley to prevent skin breakdown - pt not able to get out of bed)  Asthma -quiescent   OSA/OHS -CPAP/BIPAP QHS per respiratory   Acute respiratory failure with hypoxia/VDRF -Resolved  Anemia of critical illness    -hemoglobin stable - no evidence of blood loss   Acute renal failure due to septic shock/ATN -slowly improving - now approaching normal   Acute encephalopathy -resolved  Severe malnutrition in context of acute illness/injury  Super Morbid Obesity - Body mass index is 67.33 kg/(m^2).   Ischemic tip of the right 5th digit -felt to be due to sepsis/acute systolic CHF - follow - pt informed will likely slough off w/ time   Code Status: FULL Family Communication: no family present at time of exam Disposition Plan: transfer to gen surg floor - cont PT/OT - wound and ostomy care per Gne Surg - hopeful for eventual CIR  Consultants: [facility]  Procedures: 9/12 FLEX SIG - Severe chronic inflammatory changes in the proximal sigmoid and descending colon 9/16 Emergent exploratory laparoscopy perforated descending colon, colitis descending/sigmoid colon 9/18 Exploratory Laparoscopy, reexploration of abdomen with closure and mobilization of splenic flexure; creation of colostomy 9/25 TTE Left ventricle: moderately dilated. -mild LVH. -LVEF= 20%. Diffuse hypokinesis.  - Pulmonary arteries: PA peak pressure: 40 mm Hg (S) + RAP. - Pericardium, A trivial pericardial effusion  Antibiotics: Ceftriaxone 9/10 Flagyl 9/11 > 9/13 Flagyl 9/16 > 9/23 Cipro 9/16 > 9/23  DVT prophylaxis: SQ Heparin   Objective: Blood pressure 134/79, pulse 107, temperature 98 F (36.7 C), temperature source Oral, resp. rate 16, height 5' 2"  (1.575 m), weight 167.009 kg (368 lb 3 oz), last menstrual period 05/06/2015, SpO2 100 %.  Intake/Output Summary (Last 24 hours) at 06/23/15 1128 Last data filed at 06/23/15 0800  Gross per 24 hour  Intake    585 ml  Output   3115 ml  Net  -2530 ml   Exam: General: No acute respiratory distress - alert and conversant  Lungs: Clear to auscultation bilaterally without wheezes or crackles   Cardiovascular: Distant heart sounds without appreciable  murmur or rub  Abdomen: Nontender, morbidly obese, soft, bowel sounds positive, no rebound, no ascites, no appreciable mass Extremities: No significant cyanosis, clubbing; 1+ stable edema bilateral lower extremities  Data Reviewed: Basic Metabolic Panel:  Recent Labs Lab 06/17/15 0500 06/17/15 0814 06/18/15 0330 06/19/15 0300 06/20/15 0451 06/20/15 0948 06/22/15 0358  NA 135  --  135 138 134*  --  137  K 4.4  --  4.5 4.6 5.0  --  4.4  CL 105  --  110 111 107  --  108  CO2 21*  --  19* 21* 21*  --  24  GLUCOSE 98  --  107* 109* 112*  --  92  BUN 49*  --  46* 45* 44*  --  39*  CREATININE 2.60*  --  2.31* 2.03* 1.83*  --  1.53*  CALCIUM 7.5*  --  7.6* 8.1* 8.0*  --  8.2*  MG  --  1.8 1.9 2.0  --  2.1  --   PHOS  --  4.7* 4.5 4.1  --   --   --     CBC:  Recent Labs Lab 06/18/15 0330 06/19/15 0300 06/20/15 0451 06/22/15 0358 06/23/15 0828  WBC 22.7* 27.4* 20.2* 13.6* 12.7*  NEUTROABS 20.4* 25.5*  --   --  11.0*  HGB 7.5* 8.4* 7.8* 7.6* 8.5*  HCT 23.7* 27.6* 26.3* 26.7* 28.9*  MCV 64.1* 69.2* 72.1* 73.6* 74.7*  PLT 256 412* 396 338 281    Liver Function Tests:  Recent Labs Lab 06/18/15 0330 06/19/15 0300 06/20/15 0451  AST 13* 15 17  ALT 16 16 16   ALKPHOS 107 101 81  BILITOT 1.2 1.0 0.8  PROT 5.6* 5.8* 5.6*  ALBUMIN 1.5* 1.7* 1.7*    Cardiac Enzymes:  Recent Labs Lab 06/17/15 1508 06/17/15 2000 06/18/15 1403 06/18/15 2015 06/19/15 0300  TROPONINI 0.03 0.09* 0.11* 0.10* 0.10*    CBG:  Recent Labs Lab 06/22/15 1558 06/22/15 2002 06/22/15 2333 06/23/15 0336 06/23/15 0817  GLUCAP 102* 87 91 91 79    Recent Results (from the past 240 hour(s))  Culture, Urine     Status: None   Collection Time: 06/18/15  3:30 AM  Result Value Ref Range Status   Specimen Description URINE, CATHETERIZED  Final   Special Requests NONE  Final   Culture >=100,000 COLONIES/mL LACTOBACILLUS SPECIES  Final   Report Status 06/19/2015 FINAL  Final     Studies:    Recent x-ray studies have been reviewed in detail by the Attending Physician  Scheduled Meds:  Scheduled Meds: . sodium chloride   Intravenous Once  . antiseptic oral rinse  7 mL Mouth Rinse BID  . feeding supplement (ENSURE ENLIVE)  237 mL Oral TID BM  . furosemide  40 mg Oral BID  . heparin subcutaneous  5,000 Units Subcutaneous 3 times per day  . Influenza vac split quadrivalent PF  0.5 mL Intramuscular Tomorrow-1000  . insulin aspart  0-20 Units Subcutaneous 6 times per day  . mesalamine  500 mg Oral QID  . metoCLOPramide  5 mg Oral TID AC  . metoprolol tartrate  25 mg Oral BID  . pantoprazole  40 mg Oral Daily  .  pneumococcal 13-valent conjugate vaccine  0.5 mL Intramuscular Tomorrow-1000  . saccharomyces boulardii  250 mg Oral BID    Time spent on care of this patient: 25 mins   [facility] T , MD   [facility] Office  [phone] Pager - Text Page per Shea Evans as per below:  On-Call/Text Page:      [email or web address]      password [email or web address]  If 7PM-7AM, please contact night-coverage [email or web address] Password [email or web address] 06/23/2015, 11:28 AM   LOS: 19 days

## 2015-06-24 DIAGNOSIS — G9340 Encephalopathy, unspecified: Secondary | ICD-10-CM

## 2015-06-24 DIAGNOSIS — I272 Other secondary pulmonary hypertension: Secondary | ICD-10-CM

## 2015-06-24 DIAGNOSIS — K50814 Crohn's disease of both small and large intestine with abscess: Secondary | ICD-10-CM

## 2015-06-24 DIAGNOSIS — K631 Perforation of intestine (nontraumatic): Secondary | ICD-10-CM

## 2015-06-24 DIAGNOSIS — L8990 Pressure ulcer of unspecified site, unspecified stage: Secondary | ICD-10-CM

## 2015-06-24 LAB — CBC
HCT: 28 % — ABNORMAL LOW (ref 36.0–46.0)
HEMATOCRIT: 28.7 % — AB (ref 36.0–46.0)
Hemoglobin: 8.4 g/dL — ABNORMAL LOW (ref 12.0–15.0)
Hemoglobin: 8.5 g/dL — ABNORMAL LOW (ref 12.0–15.0)
MCH: 22.4 pg — ABNORMAL LOW (ref 26.0–34.0)
MCH: 22 pg — ABNORMAL LOW (ref 26.0–34.0)
MCHC: 30 g/dL (ref 30.0–36.0)
MCHC: 29.6 g/dL — AB (ref 30.0–36.0)
MCV: 74.7 fL — AB (ref 78.0–100.0)
MCV: 74.4 fL — AB (ref 78.0–100.0)
PLATELETS: 299 10*3/uL (ref 150–400)
Platelets: 294 10*3/uL (ref 150–400)
RBC: 3.75 MIL/uL — ABNORMAL LOW (ref 3.87–5.11)
RBC: 3.86 MIL/uL — ABNORMAL LOW (ref 3.87–5.11)
RDW: 26.9 % — AB (ref 11.5–15.5)
RDW: 27.4 % — AB (ref 11.5–15.5)
WBC: 12.1 10*3/uL — AB (ref 4.0–10.5)
WBC: 10.3 10*3/uL (ref 4.0–10.5)

## 2015-06-24 LAB — GLUCOSE, CAPILLARY
GLUCOSE-CAPILLARY: 93 mg/dL (ref 65–99)
GLUCOSE-CAPILLARY: 111 mg/dL — AB (ref 65–99)
GLUCOSE-CAPILLARY: 133 mg/dL — AB (ref 65–99)
GLUCOSE-CAPILLARY: 103 mg/dL — AB (ref 65–99)
Glucose-Capillary: 87 mg/dL (ref 65–99)
Glucose-Capillary: 95 mg/dL (ref 65–99)

## 2015-06-24 LAB — BASIC METABOLIC PANEL
ANION GAP: 6 (ref 5–15)
BUN: 29 mg/dL — ABNORMAL HIGH (ref 6–20)
CALCIUM: 7.9 mg/dL — AB (ref 8.9–10.3)
CO2: 25 mmol/L (ref 22–32)
CREATININE: 1.31 mg/dL — AB (ref 0.44–1.00)
Chloride: 105 mmol/L (ref 101–111)
GFR calc Af Amer: >60 mL/min (ref >60)
GFR, EST NON AFRICAN AMERICAN: 52 mL/min — AB (ref >60)
GLUCOSE: 90 mg/dL (ref 65–99)
Potassium: 3.6 mmol/L (ref 3.5–5.1)
Sodium: 136 mmol/L (ref 135–145)

## 2015-06-24 MED ORDER — METOPROLOL TARTRATE 50 MG PO TABS
50.0000 mg | ORAL_TABLET | Freq: Two times a day (BID) | ORAL | Status: DC
  Administered 2015-06-24 – 2015-06-26 (×4): 50 mg via ORAL
  Filled 2015-06-24 (×4): qty 1

## 2015-06-24 MED ORDER — HYDROCODONE-ACETAMINOPHEN 7.5-325 MG PO TABS
2.0000 | ORAL_TABLET | Freq: Four times a day (QID) | ORAL | Status: DC | PRN
  Administered 2015-06-24 – 2015-06-26 (×7): 2 via ORAL
  Filled 2015-06-24 (×7): qty 2

## 2015-06-24 MED ORDER — SODIUM CHLORIDE 0.9 % IJ SOLN
10.0000 mL | INTRAMUSCULAR | Status: DC | PRN
  Administered 2015-06-24: 10 mL
  Administered 2015-06-24: 20 mL
  Filled 2015-06-24 (×2): qty 40

## 2015-06-24 MED ORDER — INSULIN ASPART 100 UNIT/ML ~~LOC~~ SOLN: 0.0000 [IU] | Freq: Every day | SUBCUTANEOUS | Status: DC

## 2015-06-24 MED ORDER — SODIUM CHLORIDE 0.9 % IJ SOLN
10.0000 mL | Freq: Two times a day (BID) | INTRAMUSCULAR | Status: DC
  Administered 2015-06-24 (×2): 10 mL
  Administered 2015-06-25 – 2015-06-26 (×2): 20 mL

## 2015-06-24 MED ORDER — INSULIN ASPART 100 UNIT/ML ~~LOC~~ SOLN: 0.0000 [IU] | Freq: Three times a day (TID) | SUBCUTANEOUS | Status: DC

## 2015-06-24 NOTE — Progress Notes (Signed)
Pt continues to refuse CPAP while sleeping. RT explained importance of therapy. Pt's mom explained she has a machine at home but hardly uses it. Pt wants to wear [place] while sleeping. RN is aware.

## 2015-06-24 NOTE — Progress Notes (Signed)
Bleeding from the subcutaneous tissue and sloughed toma in the anterior medial aspect of her detached stoma.  It is not retracted, butis detached.  I removed some clot and packed the parastomal suQ with saline soaked gauze.  She will probably need a revision of her stoma in the future.  Eating dinner.  Dominique [name]. Dahlia Bailiff, MD, [place] [phone] [facility]

## 2015-06-24 NOTE — Progress Notes (Signed)
Dr. Hulen Skains notified patient's ostomy bleeding with clots.  Stat CBC ordered

## 2015-06-24 NOTE — Progress Notes (Signed)
PROGRESS NOTE  Dominique [name] OMV:[id number] DOB: 1981-05-03 DOA: 06/03/2015 PCP: Benito Mccreedy, MD  Admit HPI / Brief Narrative: 33 year old F Hx Asthma, OSA/OHS, Hepatic Steatosis, and Recurrent Colitis since Jan 2016 who presented to the ED with severe abdominal pain with diarrhea and occasional vomiting. She had not been able to see GI due to insurance and referral issues. When her sx became worse than her usual, she presented to the ED.    HPI/Subjective: Seen after she got some pain medicine, she is awake, alert and oriented but sleepy. It appears that she had problem with ambulation prior to admission secondary to her morbid obesity. Wants to participate more with PT, CIR when she can get setup.  Assessment/Plan:  Newly diagnosed Crohn's disease   -S/P 9/12 flexible sigmoidoscopy w/ bx consistent with Crohn's disease -Pentasa 500 QID per GI recs  -stopped steroids as no role presently per GI opinion   Sepsis due to Perforated left colon w/ Peritonitis and Peritoneal abscess -9/16 patient diagnosed with perforated left colon from Crohn's colitis -Emergent exploratory laparoscopy 9/16 w/ partial colon resection and drainage of intra-abdominal abscess -Returned to OR 9/18 for reexploration of abdomen with closure, and mobilization of the splenic flexure with creation of colostomy -ongoing care per Gen Surgery - there is some concern about her ostomy    Persistent Sinus tachycardia  -no evidence of PE on angiogram 9/16 - TSH normal. -This is likely to CHF, is on 25 mg of metoprolol will increase to 50.  Severe systolic CHF - ?newly diagnosed  -EF 20% per TTE 06/18/15 w/ diffuse hypokinesis - felt to be stress/critical illness CM and likely to improve -Cardiology following - cont diuresis as able (replace foley to prevent skin breakdown - pt not able to get out of bed)  Asthma -quiescent   OSA/OHS -CPAP/BIPAP QHS per respiratory, patient uses BiPAP daily at bedtime at  home.  Acute respiratory failure with hypoxia/VDRF -Resolved  Anemia of critical illness  -hemoglobin stable - no evidence of blood loss   Acute renal failure due to septic shock/ATN -Serum creatinine reached a peak of 2.8, improving. -Creatinine is trending down, today is 1.3.  Acute encephalopathy -resolved  Severe malnutrition in context of acute illness/injury  Super Morbid Obesity - Body mass index is 67.33 kg/(m^2).   Ischemic tip of the right 5th digit -felt to be due to sepsis/acute systolic CHF - follow - pt informed will likely slough off w/ time   Code Status: FULL Family Communication: no family present at time of exam Disposition Plan: transfer to gen surg floor - cont PT/OT - wound and ostomy care per Gne Surg - hopeful for eventual CIR  Consultants: [facility]  Procedures: 9/12 FLEX SIG - Severe chronic inflammatory changes in the proximal sigmoid and descending colon 9/16 Emergent exploratory laparoscopy perforated descending colon, colitis descending/sigmoid colon 9/18 Exploratory Laparoscopy, reexploration of abdomen with closure and mobilization of splenic flexure; creation of colostomy 9/25 TTE Left ventricle: moderately dilated. -mild LVH. -LVEF= 20%. Diffuse hypokinesis.  - Pulmonary arteries: PA peak pressure: 40 mm Hg (S) + RAP. - Pericardium, A trivial pericardial effusion  Antibiotics: Ceftriaxone 9/10 Flagyl 9/11 > 9/13 Flagyl 9/16 > 9/23 Cipro 9/16 > 9/23  DVT prophylaxis: SQ Heparin   Objective: Blood pressure 117/76, pulse 100, temperature 97.6 F (36.4 C), temperature source Oral, resp. rate 20, height 5' 2"  (1.575 m), weight 167.009 kg (368 lb 3 oz), last menstrual period 05/06/2015, SpO2 93 %.  Intake/Output Summary (Last 24 hours) at 06/24/15 1149 Last data filed at 06/24/15 0857  Gross per 24 hour  Intake    810 ml  Output   2175 ml  Net  -1365 ml   Exam: General: No acute respiratory distress - alert  and conversant  Lungs: Clear to auscultation bilaterally without wheezes or crackles   Cardiovascular: Distant heart sounds without appreciable murmur or rub  Abdomen: Nontender, morbidly obese, soft, bowel sounds positive, no rebound, no ascites, no appreciable mass Extremities: No significant cyanosis, clubbing; 1+ stable edema bilateral lower extremities  Data Reviewed: Basic Metabolic Panel:  Recent Labs Lab 06/18/15 0330 06/19/15 0300 06/20/15 0451 06/20/15 0948 06/22/15 0358 06/24/15 0502  NA 135 138 134*  --  137 136  K 4.5 4.6 5.0  --  4.4 3.6  CL 110 111 107  --  108 105  CO2 19* 21* 21*  --  24 25  GLUCOSE 107* 109* 112*  --  92 90  BUN 46* 45* 44*  --  39* 29*  CREATININE 2.31* 2.03* 1.83*  --  1.53* 1.31*  CALCIUM 7.6* 8.1* 8.0*  --  8.2* 7.9*  MG 1.9 2.0  --  2.1  --   --   PHOS 4.5 4.1  --   --   --   --     CBC:  Recent Labs Lab 06/18/15 0330 06/19/15 0300 06/20/15 0451 06/22/15 0358 06/23/15 0828 06/24/15 0502  WBC 22.7* 27.4* 20.2* 13.6* 12.7* 12.1*  NEUTROABS 20.4* 25.5*  --   --  11.0*  --   HGB 7.5* 8.4* 7.8* 7.6* 8.5* 8.4*  HCT 23.7* 27.6* 26.3* 26.7* 28.9* 28.0*  MCV 64.1* 69.2* 72.1* 73.6* 74.7* 74.7*  PLT 256 412* 396 338 281 299    Liver Function Tests:  Recent Labs Lab 06/18/15 0330 06/19/15 0300 06/20/15 0451  AST 13* 15 17  ALT 16 16 16   ALKPHOS 107 101 81  BILITOT 1.2 1.0 0.8  PROT 5.6* 5.8* 5.6*  ALBUMIN 1.5* 1.7* 1.7*    Cardiac Enzymes:  Recent Labs Lab 06/17/15 1508 06/17/15 2000 06/18/15 1403 06/18/15 2015 06/19/15 0300  TROPONINI 0.03 0.09* 0.11* 0.10* 0.10*    CBG:  Recent Labs Lab 06/23/15 1713 06/23/15 2322 06/24/15 0513 06/24/15 0816 06/24/15 1131  GLUCAP 92 87 95 93 111*    Recent Results (from the past 240 hour(s))  Culture, Urine     Status: None   Collection Time: 06/18/15  3:30 AM  Result Value Ref Range Status   Specimen Description URINE, CATHETERIZED  Final   Special Requests NONE   Final   Culture >=100,000 COLONIES/mL LACTOBACILLUS SPECIES  Final   Report Status 06/19/2015 FINAL  Final     Studies:   Recent x-ray studies have been reviewed in detail by the Attending Physician  Scheduled Meds:  Scheduled Meds: . antiseptic oral rinse  7 mL Mouth Rinse BID  . feeding supplement (ENSURE ENLIVE)  237 mL Oral TID BM  . furosemide  40 mg Oral BID  . heparin subcutaneous  5,000 Units Subcutaneous 3 times per day  . Influenza vac split quadrivalent PF  0.5 mL Intramuscular Tomorrow-1000  . insulin aspart  0-20 Units Subcutaneous 6 times per day  . mesalamine  500 mg Oral QID  . metoCLOPramide  5 mg Oral TID AC  . metoprolol tartrate  25 mg Oral BID  . pantoprazole  40 mg Oral Daily  . pneumococcal 13-valent conjugate vaccine  0.5  mL Intramuscular Tomorrow-1000  . saccharomyces boulardii  250 mg Oral BID  . sodium chloride  10-40 mL Intracatheter Q12H    Time spent on care of this patient: 25 mins   Birdie Hopes , MD   [facility] Office  [phone] Pager - Text Page per Shea Evans as per below:  On-Call/Text Page:      [email or web address]      password [email or web address]  If 7PM-7AM, please contact night-coverage [email or web address] Password [email or web address] 06/24/2015, 11:49 AM   LOS: 20 days

## 2015-06-24 NOTE — Progress Notes (Signed)
13 Days Post-Op  Subjective: Stable and alert.  In no distress.  Cooperative.  Says she is eating some.  Getting up to chair.  Passing stool and flatus per ostomy. VAC  discontinued yesterday. Now getting twice a day wet-to-dry dressings per nursing. Ostomy viable with good output, but apparently some mucocutaneous separation from 7:00 to 12:00. Ostomy wafer and appliance currently intact without leakage. Afebrile.  Vital signs stable. Hemoglobin 8.4.  WBC 12,100.  Essentially unchanged.  CT scan yesterday shows mild thickening of right and transverse colon, possible mild colitis.  Tiny amount of free fluid in pelvis.  No abscess.  Objective: Vital signs in last 24 hours: Temp:  [97.6 F (36.4 C)-98.8 F (37.1 C)] 97.6 F (36.4 C) (10/01 0518) Pulse Rate:  [100-111] 100 (10/01 0518) Resp:  [15-20] 20 (10/01 0518) BP: (114-134)/(60-93) 117/76 mmHg (10/01 0518) SpO2:  [93 %-100 %] 93 % (10/01 0518) FiO2 (%):  [2 %] 2 % (10/01 0518) Last BM Date: 06/22/15  Intake/Output from previous day: 09/30 0701 - 10/01 0700 In: 330 [P.O.:320; I.V.:10] Out: 2375 [Urine:2075; Stool:300] Intake/Output this shift: Total I/O In: 210 [P.O.:200; I.V.:10] Out: 575 [Urine:275; Stool:300]    EXAM: General appearance: Morbidly obese.  Alert.  Pleasant.  Cooperative. GI: Obese.  Soft.  Minimal tenderness.  Not distended.  Active bowel sounds.  Midline wound packed, clean and dry without drainage.  Ostomy reddish-brown.  Good stool.  Ostomy appliance intact without ligature odor.  Lab Results:  Results for orders placed or performed during the hospital encounter of 06/03/15 (from the past 24 hour(s))  Glucose, capillary     Status: None   Collection Time: 06/23/15  8:17 AM  Result Value Ref Range   Glucose-Capillary 79 65 - 99 mg/dL   Comment 1 Notify RN    Comment 2 Document in Chart   CBC with Differential/Platelet     Status: Abnormal   Collection Time: 06/23/15  8:28 AM  Result Value Ref  Range   WBC 12.7 (H) 4.0 - 10.5 K/uL   RBC 3.87 3.87 - 5.11 MIL/uL   Hemoglobin 8.5 (L) 12.0 - 15.0 g/dL   HCT 28.9 (L) 36.0 - 46.0 %   MCV 74.7 (L) 78.0 - 100.0 fL   MCH 22.0 (L) 26.0 - 34.0 pg   MCHC 29.4 (L) 30.0 - 36.0 g/dL   RDW 26.5 (H) 11.5 - 15.5 %   Platelets 281 150 - 400 K/uL   Neutrophils Relative % 87 %   Lymphocytes Relative 5 %   Monocytes Relative 6 %   Eosinophils Relative 2 %   Basophils Relative 0 %   Neutro Abs 11.0 (H) 1.7 - 7.7 K/uL   Lymphs Abs 0.6 (L) 0.7 - 4.0 K/uL   Monocytes Absolute 0.8 0.1 - 1.0 K/uL   Eosinophils Absolute 0.3 0.0 - 0.7 K/uL   Basophils Absolute 0.0 0.0 - 0.1 K/uL   RBC Morphology POLYCHROMASIA PRESENT   Glucose, capillary     Status: None   Collection Time: 06/23/15 12:59 PM  Result Value Ref Range   Glucose-Capillary 92 65 - 99 mg/dL  Glucose, capillary     Status: None   Collection Time: 06/23/15  5:13 PM  Result Value Ref Range   Glucose-Capillary 92 65 - 99 mg/dL   Comment 1 Notify RN    Comment 2 Document in Chart   Glucose, capillary     Status: None   Collection Time: 06/23/15 11:22 PM  Result Value Ref Range  Glucose-Capillary 87 65 - 99 mg/dL   Comment 1 Notify RN    Comment 2 Glucose Stabilizer   CBC     Status: Abnormal   Collection Time: 06/24/15  5:02 AM  Result Value Ref Range   WBC 12.1 (H) 4.0 - 10.5 K/uL   RBC 3.75 (L) 3.87 - 5.11 MIL/uL   Hemoglobin 8.4 (L) 12.0 - 15.0 g/dL   HCT 28.0 (L) 36.0 - 46.0 %   MCV 74.7 (L) 78.0 - 100.0 fL   MCH 22.4 (L) 26.0 - 34.0 pg   MCHC 30.0 30.0 - 36.0 g/dL   RDW 26.9 (H) 11.5 - 15.5 %   Platelets 299 150 - 400 K/uL     Studies/Results: Ct Abdomen Pelvis Wo Contrast  06/23/2015   CLINICAL DATA:  Wound infection. History of perforated left colon with peritonitis and peritoneal abscess. Crohn's disease . Laparotomy 06/11/2015 and 06/09/2015  EXAM: CT ABDOMEN AND PELVIS WITHOUT CONTRAST  TECHNIQUE: Multidetector CT imaging of the abdomen and pelvis was performed  following the standard protocol without IV contrast.  COMPARISON:  CT 06/04/2015  FINDINGS: Lower chest: New mild bibasilar atelectasis without pleural effusion.  Hepatobiliary: Limited evaluation of the liver due to her large patient size and lack of intravenous contrast. No liver lesion. Gallbladder bile ducts intact.  Pancreas: Mild pancreatic enlargement compared to the prior study with mild edema below the uncinate process. Possible pancreatitis.  Spleen: Negative  Adrenals/Urinary Tract: Negative for renal obstruction or stone.  Stomach/Bowel: Negative for bowel obstruction. No significant ileus. Left colostomy. Question mild edema in the right and transverse colon, not present previously. This could be due to colitis or possibly lack of adequate distention.  No intra-abdominal abscess.  Vascular/Lymphatic: Negative  Reproductive: Normal uterus.  No adnexal mass.  Other: Small amount of free fluid in the left pelvis.  No abscess.  Open wound in the midline down to the fascia. No abscess in the soft tissues.  Musculoskeletal: Negative  IMPRESSION: Postop left colectomy with left colostomy. Mild thickening of the right and transverse colon may reflect colitis. These areas did not show thickening on the prior study and therefore not likely to represent Crohn's colitis.  Small amount of free fluid in the left pelvis.  Negative for abscess  Open wound in the anterior abdominal wall.  Question mild pancreatitis with mild pancreatic edema. Limited evaluation due to lack of IV contrast large patient size.  Mild atelectasis in the lung bases.   Electronically Signed   By: Franchot Gallo M.D.   On: 06/23/2015 19:53    . antiseptic oral rinse  7 mL Mouth Rinse BID  . feeding supplement (ENSURE ENLIVE)  237 mL Oral TID BM  . furosemide  40 mg Oral BID  . heparin subcutaneous  5,000 Units Subcutaneous 3 times per day  . Influenza vac split quadrivalent PF  0.5 mL Intramuscular Tomorrow-1000  . insulin aspart  0-20  Units Subcutaneous 6 times per day  . mesalamine  500 mg Oral QID  . metoCLOPramide  5 mg Oral TID AC  . metoprolol tartrate  25 mg Oral BID  . pantoprazole  40 mg Oral Daily  . pneumococcal 13-valent conjugate vaccine  0.5 mL Intramuscular Tomorrow-1000  . saccharomyces boulardii  250 mg Oral BID  . sodium chloride  10-40 mL Intracatheter Q12H     Assessment/Plan: s/p Procedure(s): RE-EXPLORATION OF ABDOMEN  CREATION OF COLOSTOMY  Perforated descending colon, colitis POD 13/15 -  s/p ex lap with  DRAINAGE OF INTRA-ABDOMINAL ABSCESS, PARTIAL COLON RESECTION, re-exploration and creation of colostomy and abdominal closure - Tolerating soft diet -currently on 6N - Pt continuing to progress with PT - Inpatient rehab when medically stable - Wound care changed to wet-to-dry twice a day yesterday.  Seems to be going well.  Needs deep packing. -Ostomy with mucocutaneous separation, one third circumference.  Currently without problems otherwise.  Treating conservatively.  Appreciate [facility] care. - WBC 13.6 yest. and trending down. Finished post-op 7-day course of cipro/flagyl on 9/23. Final body fluid culture report: no growth to date. No fevers. -CT abdomen yesterday negative for abscess.  - SCD's and Heparin for DVT prophylaxis  Suspected cardiomyopathy secondary to sepsis - Cardiology following - BB, Imdur, diuretic per cards  IBD as seen on pathology - mesalamine capsule 1,000 mg PO QID - probiotic - GI recommended weaning of steroids and d/c of flagyl - GI signed off  AKI - resolving slowly - SCr 1.53, GFR 39  Disp -Surgically stable for transfer to CIR, but will need the okay from medicine and cardiology  @PROBHOSP @  LOS: 20 days    Benno Brensinger M 06/24/2015  . .prob

## 2015-06-25 DIAGNOSIS — K651 Peritoneal abscess: Secondary | ICD-10-CM

## 2015-06-25 LAB — RENAL FUNCTION PANEL
ALBUMIN: 1.6 g/dL — AB (ref 3.5–5.0)
ANION GAP: 4 — AB (ref 5–15)
BUN: 27 mg/dL — AB (ref 6–20)
CHLORIDE: 103 mmol/L (ref 101–111)
CO2: 27 mmol/L (ref 22–32)
Calcium: 7.6 mg/dL — ABNORMAL LOW (ref 8.9–10.3)
Creatinine, Ser: 1.2 mg/dL — ABNORMAL HIGH (ref 0.44–1.00)
GFR calc Af Amer: >60 mL/min (ref >60)
GFR, EST NON AFRICAN AMERICAN: 58 mL/min — AB (ref >60)
GLUCOSE: 95 mg/dL (ref 65–99)
PHOSPHORUS: 3.1 mg/dL (ref 2.5–4.6)
POTASSIUM: 3.5 mmol/L (ref 3.5–5.1)
Sodium: 134 mmol/L — ABNORMAL LOW (ref 135–145)

## 2015-06-25 LAB — GLUCOSE, CAPILLARY
GLUCOSE-CAPILLARY: 97 mg/dL (ref 65–99)
GLUCOSE-CAPILLARY: 105 mg/dL — AB (ref 65–99)
GLUCOSE-CAPILLARY: 93 mg/dL (ref 65–99)
GLUCOSE-CAPILLARY: 78 mg/dL (ref 65–99)

## 2015-06-25 MED ORDER — POLYETHYLENE GLYCOL 3350 17 G PO PACK
17.0000 g | PACK | Freq: Every day | ORAL | Status: DC
  Administered 2015-06-25 – 2015-06-26 (×2): 17 g via ORAL
  Filled 2015-06-25 (×3): qty 1

## 2015-06-25 NOTE — Progress Notes (Signed)
14 Days Post-Op  Subjective: Stable and alert.  Cooperative.  Up to chair yesterday for several hours.  Denies abdominal pain.  Tolerating soft diet. Dr. Hulen Skains examined her stoma yesterday and reports that there is further mucocutaneous separation but no retraction.  Some packing was placed in the parastomal subcutaneous tissue. Passing flatus and some stool per ostomy, although not as much as late last week. Afebrile with stable vital signs. Lab work today shows creatinine 1.2, glucose 95, potassium 3.5.  Objective: Vital signs in last 24 hours: Temp:  [97.9 F (36.6 C)-98.4 F (36.9 C)] 98.3 F (36.8 C) (10/02 0539) Pulse Rate:  [92-110] 92 (10/02 0539) Resp:  [18-21] 18 (10/02 0539) BP: (96-104)/(53-68) 97/68 mmHg (10/02 0539) SpO2:  [97 %-99 %] 99 % (10/02 0539) Last BM Date: 06/24/15  Intake/Output from previous day: 10/01 0701 - 10/02 0700 In: 860 [P.O.:840; I.V.:20] Out: -  Intake/Output this shift:   EXAM: General appearance: Morbidly obese. Alert. Pleasant. Cooperative. GI: Obese. Soft. Minimal tenderness. Not distended. Active bowel sounds. Midline wound packed, clean and dry without drainage. Ostomy reddish-brown. Small amount of stool.  stool. Ostomy appliance intact    Lab Results:  Results for orders placed or performed during the hospital encounter of 06/03/15 (from the past 24 hour(s))  Glucose, capillary     Status: None   Collection Time: 06/24/15  8:16 AM  Result Value Ref Range   Glucose-Capillary 93 65 - 99 mg/dL  Glucose, capillary     Status: Abnormal   Collection Time: 06/24/15 11:31 AM  Result Value Ref Range   Glucose-Capillary 111 (H) 65 - 99 mg/dL  Glucose, capillary     Status: Abnormal   Collection Time: 06/24/15  4:54 PM  Result Value Ref Range   Glucose-Capillary 133 (H) 65 - 99 mg/dL  CBC     Status: Abnormal   Collection Time: 06/24/15  8:10 PM  Result Value Ref Range   WBC 10.3 4.0 - 10.5 K/uL   RBC 3.86 (L) 3.87 - 5.11  MIL/uL   Hemoglobin 8.5 (L) 12.0 - 15.0 g/dL   HCT 28.7 (L) 36.0 - 46.0 %   MCV 74.4 (L) 78.0 - 100.0 fL   MCH 22.0 (L) 26.0 - 34.0 pg   MCHC 29.6 (L) 30.0 - 36.0 g/dL   RDW 27.4 (H) 11.5 - 15.5 %   Platelets 294 150 - 400 K/uL  Glucose, capillary     Status: Abnormal   Collection Time: 06/24/15 10:06 PM  Result Value Ref Range   Glucose-Capillary 103 (H) 65 - 99 mg/dL   Comment 1 Notify RN    Comment 2 Document in Chart   Renal function panel     Status: Abnormal   Collection Time: 06/25/15  4:27 AM  Result Value Ref Range   Sodium 134 (L) 135 - 145 mmol/L   Potassium 3.5 3.5 - 5.1 mmol/L   Chloride 103 101 - 111 mmol/L   CO2 27 22 - 32 mmol/L   Glucose, Bld 95 65 - 99 mg/dL   BUN 27 (H) 6 - 20 mg/dL   Creatinine, Ser 1.20 (H) 0.44 - 1.00 mg/dL   Calcium 7.6 (L) 8.9 - 10.3 mg/dL   Phosphorus 3.1 2.5 - 4.6 mg/dL   Albumin 1.6 (L) 3.5 - 5.0 g/dL   GFR calc non Af Amer 58 (L) >60 mL/min   GFR calc Af Amer >60 >60 mL/min   Anion gap 4 (L) 5 - 15  Studies/Results: No results found.  . feeding supplement (ENSURE ENLIVE)  237 mL Oral TID BM  . furosemide  40 mg Oral BID  . heparin subcutaneous  5,000 Units Subcutaneous 3 times per day  . Influenza vac split quadrivalent PF  0.5 mL Intramuscular Tomorrow-1000  . insulin aspart  0-15 Units Subcutaneous TID WC  . insulin aspart  0-5 Units Subcutaneous QHS  . mesalamine  500 mg Oral QID  . metoCLOPramide  5 mg Oral TID AC  . metoprolol tartrate  50 mg Oral BID  . pantoprazole  40 mg Oral Daily  . pneumococcal 13-valent conjugate vaccine  0.5 mL Intramuscular Tomorrow-1000  . polyethylene glycol  17 g Oral Daily  . saccharomyces boulardii  250 mg Oral BID  . sodium chloride  10-40 mL Intracatheter Q12H     Assessment/Plan: s/p Procedure(s): RE-EXPLORATION OF ABDOMEN  CREATION OF COLOSTOMY  Perforated descending colon, colitis POD 14/16 - s/p ex lap with DRAINAGE OF INTRA-ABDOMINAL ABSCESS, PARTIAL COLON RESECTION,  re-exploration and creation of colostomy and abdominal closure - Tolerating soft diet -currently on 6N - Pt continuing to progress with PT  - Wound care changed to wet-to-dry twice a day yesterday. Seems to be going well. Needs deep packing. -Ostomy with mucocutaneous separation, progressive.  This may require revision, especially if retracts or ostomy output compromise. Currently without problems otherwise.  No indication for surgical intervention today. Treating conservatively. Appreciate [facility] care. -Daily MiraLAX ordered  - WBC  trending down. Finished post-op 7-day course of cipro/flagyl on 9/23. Final body fluid culture report: no growth to date. No fevers. -CT abdomen 9/30 negative for abscess.  - SCD's and Heparin for DVT prophylaxis  Suspected cardiomyopathy secondary to sepsis - Cardiology following - BB, Imdur, diuretic per cards  IBD as seen on pathology - mesalamine capsule 1,000 mg PO QID - probiotic - GI recommended weaning of steroids and d/c of flagyl - GI signed off  AKI - resolving slowly - SCr 1.53, GFR 39  Disp -Ultimate goal is transfer to CIR, but will need to wait until we decide about management of ostomy.  Also will need clearance from medicine and from cardiology to go to rehabilitation.  @PROBHOSP @  LOS: 21 days    Dominique [name] 06/25/2015  . .prob

## 2015-06-25 NOTE — Progress Notes (Signed)
PROGRESS NOTE  Harlem Thresher XTG:[id number] DOB: 08-30-1981 DOA: 06/03/2015 PCP: Benito Mccreedy, MD  Admit HPI / Brief Narrative: 34 year old F Hx Asthma, OSA/OHS, Hepatic Steatosis, and Recurrent Colitis since Jan 2016 who presented to the ED with severe abdominal pain with diarrhea and occasional vomiting. She had not been able to see GI due to insurance and referral issues. When her sx became worse than her usual, she presented to the ED.    HPI/Subjective: Doing okay, sitting on chair for about 6 hours yesterday, was not able to get up on herself the use the [place] lift to get her to the chair. Continue PT/OT, hopefully CIR will take her as soon as possible. From medical and surgical standpoint patient is okay for discharge.  Assessment/Plan:  Newly diagnosed Crohn's disease   -S/P 9/12 flexible sigmoidoscopy w/ bx consistent with Crohn's disease -Pentasa 500 QID per GI recs  -stopped steroids as no role presently per GI opinion, bleeding appears to be from around the stoma.   Sepsis due to Perforated left colon w/ Peritonitis and Peritoneal abscess -9/16 patient diagnosed with perforated left colon from Crohn's colitis -Emergent exploratory laparoscopy 9/16 w/ partial colon resection and drainage of intra-abdominal abscess -Returned to OR 9/18 for reexploration of abdomen with closure, and mobilization of the splenic flexure with creation of colostomy -ongoing care per Gen Surgery - there is some concern about her ostomy    Persistent Sinus tachycardia  -no evidence of PE on angiogram 9/16 - TSH normal. -This is likely to CHF, is on 25 mg of metoprolol will increase to 50.  Severe systolic CHF - ?newly diagnosed  -EF 20% per TTE 06/18/15 w/ diffuse hypokinesis - felt to be stress/critical illness CM and likely to improve -Cardiology following - cont diuresis as able (replace foley to prevent skin breakdown - pt not able to get out of bed)  Asthma -quiescent    OSA/OHS -CPAP/BIPAP QHS per respiratory, patient uses BiPAP daily at bedtime at home.  Acute respiratory failure with hypoxia/VDRF -Resolved  Anemia of critical illness  -hemoglobin stable - no evidence of blood loss   Acute renal failure due to septic shock/ATN -Serum creatinine reached a peak of 2.8, improving. -Creatinine is trending down, today is 1.3.  Acute encephalopathy -resolved  Severe malnutrition in context of acute illness/injury  Super Morbid Obesity - Body mass index is 67.33 kg/(m^2).   Ischemic tip of the right 5th digit -Felt to be due to sepsis/acute systolic CHF - follow - pt informed will likely slough off w/ time.    Code Status: FULL Family Communication: no family present at time of exam Disposition Plan: transfer to gen surg floor - cont PT/OT - wound and ostomy care per [facility] - hopeful for eventual CIR  Consultants: [facility]  Procedures: 9/12 FLEX SIG - Severe chronic inflammatory changes in the proximal sigmoid and descending colon 9/16 Emergent exploratory laparoscopy perforated descending colon, colitis descending/sigmoid colon 9/18 Exploratory Laparoscopy, reexploration of abdomen with closure and mobilization of splenic flexure; creation of colostomy 9/25 TTE Left ventricle: moderately dilated. -mild LVH. -LVEF= 20%. Diffuse hypokinesis.  - Pulmonary arteries: PA peak pressure: 40 mm Hg (S) + RAP. - Pericardium, A trivial pericardial effusion  Antibiotics: Ceftriaxone 9/10 Flagyl 9/11 > 9/13 Flagyl 9/16 > 9/23 Cipro 9/16 > 9/23  DVT prophylaxis: SQ Heparin   Objective: Blood pressure 97/68, pulse 92, temperature 98.3 F (36.8 C), temperature source Oral, resp. rate 18, height 5' 2"  (1.575  m), weight 167.009 kg (368 lb 3 oz), last menstrual period 05/06/2015, SpO2 99 %.  Intake/Output Summary (Last 24 hours) at 06/25/15 1327 Last data filed at 06/25/15 1151  Gross per 24 hour  Intake    520 ml  Output     130 ml  Net    390 ml   Exam: General: No acute respiratory distress - alert and conversant  Lungs: Clear to auscultation bilaterally without wheezes or crackles   Cardiovascular: Distant heart sounds without appreciable murmur or rub  Abdomen: Nontender, morbidly obese, soft, bowel sounds positive, no rebound, no ascites, no appreciable mass Extremities: No significant cyanosis, clubbing; 1+ stable edema bilateral lower extremities  Data Reviewed: Basic Metabolic Panel:  Recent Labs Lab 06/19/15 0300 06/20/15 0451 06/20/15 0948 06/22/15 0358 06/24/15 0502 06/25/15 0427  NA 138 134*  --  137 136 134*  K 4.6 5.0  --  4.4 3.6 3.5  CL 111 107  --  108 105 103  CO2 21* 21*  --  24 25 27   GLUCOSE 109* 112*  --  92 90 95  BUN 45* 44*  --  39* 29* 27*  CREATININE 2.03* 1.83*  --  1.53* 1.31* 1.20*  CALCIUM 8.1* 8.0*  --  8.2* 7.9* 7.6*  MG 2.0  --  2.1  --   --   --   PHOS 4.1  --   --   --   --  3.1    CBC:  Recent Labs Lab 06/19/15 0300 06/20/15 0451 06/22/15 0358 06/23/15 0828 06/24/15 0502 06/24/15 2010  WBC 27.4* 20.2* 13.6* 12.7* 12.1* 10.3  NEUTROABS 25.5*  --   --  11.0*  --   --   HGB 8.4* 7.8* 7.6* 8.5* 8.4* 8.5*  HCT 27.6* 26.3* 26.7* 28.9* 28.0* 28.7*  MCV 69.2* 72.1* 73.6* 74.7* 74.7* 74.4*  PLT 412* 396 338 281 299 294    Liver Function Tests:  Recent Labs Lab 06/19/15 0300 06/20/15 0451 06/25/15 0427  AST 15 17  --   ALT 16 16  --   ALKPHOS 101 81  --   BILITOT 1.0 0.8  --   PROT 5.8* 5.6*  --   ALBUMIN 1.7* 1.7* 1.6*    Cardiac Enzymes:  Recent Labs Lab 06/18/15 1403 06/18/15 2015 06/19/15 0300  TROPONINI 0.11* 0.10* 0.10*    CBG:  Recent Labs Lab 06/24/15 1131 06/24/15 1654 06/24/15 2206 06/25/15 0741 06/25/15 1149  GLUCAP 111* 133* 103* 97 105*    Recent Results (from the past 240 hour(s))  Culture, Urine     Status: None   Collection Time: 06/18/15  3:30 AM  Result Value Ref Range Status   Specimen Description  URINE, CATHETERIZED  Final   Special Requests NONE  Final   Culture >=100,000 COLONIES/mL LACTOBACILLUS SPECIES  Final   Report Status 06/19/2015 FINAL  Final     Studies:   Recent x-ray studies have been reviewed in detail by the Attending Physician  Scheduled Meds:  Scheduled Meds: . feeding supplement (ENSURE ENLIVE)  237 mL Oral TID BM  . furosemide  40 mg Oral BID  . heparin subcutaneous  5,000 Units Subcutaneous 3 times per day  . Influenza vac split quadrivalent PF  0.5 mL Intramuscular Tomorrow-1000  . insulin aspart  0-15 Units Subcutaneous TID WC  . insulin aspart  0-5 Units Subcutaneous QHS  . mesalamine  500 mg Oral QID  . metoCLOPramide  5 mg Oral TID AC  .  metoprolol tartrate  50 mg Oral BID  . pantoprazole  40 mg Oral Daily  . pneumococcal 13-valent conjugate vaccine  0.5 mL Intramuscular Tomorrow-1000  . polyethylene glycol  17 g Oral Daily  . saccharomyces boulardii  250 mg Oral BID  . sodium chloride  10-40 mL Intracatheter Q12H    Time spent on care of this patient: 25 mins   Birdie Hopes , MD   [facility] Office  [phone] Pager - Text Page per Shea Evans as per below:  On-Call/Text Page:      [email or web address]      password [email or web address]  If 7PM-7AM, please contact night-coverage [email or web address] Password [email or web address] 06/25/2015, 1:27 PM   LOS: 21 days

## 2015-06-26 ENCOUNTER — Telehealth: Payer: Self-pay | Admitting: Nurse Practitioner

## 2015-06-26 ENCOUNTER — Inpatient Hospital Stay (HOSPITAL_COMMUNITY)
Admission: AD | Admit: 2015-06-26 | Discharge: 2015-07-21 | DRG: 947 | Disposition: A | Discharge status: Other Acute Inpatient Hospital | Payer: Medicaid Other | Source: Intra-hospital | Attending: Physical Medicine & Rehabilitation | Admitting: Physical Medicine & Rehabilitation

## 2015-06-26 DIAGNOSIS — K9409 Other complications of colostomy: Secondary | ICD-10-CM

## 2015-06-26 DIAGNOSIS — T8143XA Infection following a procedure, organ and space surgical site, initial encounter: Secondary | ICD-10-CM | POA: Insufficient documentation

## 2015-06-26 DIAGNOSIS — R5381 Other malaise: Secondary | ICD-10-CM | POA: Diagnosis present

## 2015-06-26 DIAGNOSIS — K50118 Crohn's disease of large intestine with other complication: Secondary | ICD-10-CM | POA: Diagnosis present

## 2015-06-26 DIAGNOSIS — K651 Peritoneal abscess: Secondary | ICD-10-CM | POA: Diagnosis not present

## 2015-06-26 DIAGNOSIS — Y833 Surgical operation with formation of external stoma as the cause of abnormal reaction of the patient, or of later complication, without mention of misadventure at the time of the procedure: Secondary | ICD-10-CM | POA: Diagnosis not present

## 2015-06-26 DIAGNOSIS — E662 Morbid (severe) obesity with alveolar hypoventilation: Secondary | ICD-10-CM | POA: Diagnosis not present

## 2015-06-26 DIAGNOSIS — Z9889 Other specified postprocedural states: Secondary | ICD-10-CM

## 2015-06-26 DIAGNOSIS — D573 Sickle-cell trait: Secondary | ICD-10-CM

## 2015-06-26 DIAGNOSIS — I429 Cardiomyopathy, unspecified: Secondary | ICD-10-CM | POA: Diagnosis not present

## 2015-06-26 DIAGNOSIS — K50019 Crohn's disease of small intestine with unspecified complications: Secondary | ICD-10-CM | POA: Diagnosis not present

## 2015-06-26 DIAGNOSIS — K5090 Crohn's disease, unspecified, without complications: Secondary | ICD-10-CM

## 2015-06-26 DIAGNOSIS — T814XXA Infection following a procedure, initial encounter: Secondary | ICD-10-CM | POA: Diagnosis not present

## 2015-06-26 DIAGNOSIS — I5022 Chronic systolic (congestive) heart failure: Secondary | ICD-10-CM | POA: Diagnosis present

## 2015-06-26 DIAGNOSIS — K631 Perforation of intestine (nontraumatic): Secondary | ICD-10-CM | POA: Diagnosis not present

## 2015-06-26 DIAGNOSIS — E876 Hypokalemia: Secondary | ICD-10-CM

## 2015-06-26 DIAGNOSIS — Z87891 Personal history of nicotine dependence: Secondary | ICD-10-CM | POA: Diagnosis not present

## 2015-06-26 DIAGNOSIS — G4733 Obstructive sleep apnea (adult) (pediatric): Secondary | ICD-10-CM | POA: Diagnosis not present

## 2015-06-26 DIAGNOSIS — I96 Gangrene, not elsewhere classified: Secondary | ICD-10-CM

## 2015-06-26 DIAGNOSIS — I471 Supraventricular tachycardia: Secondary | ICD-10-CM | POA: Diagnosis not present

## 2015-06-26 DIAGNOSIS — I998 Other disorder of circulatory system: Secondary | ICD-10-CM | POA: Diagnosis not present

## 2015-06-26 DIAGNOSIS — K5900 Constipation, unspecified: Secondary | ICD-10-CM

## 2015-06-26 DIAGNOSIS — Z79899 Other long term (current) drug therapy: Secondary | ICD-10-CM | POA: Diagnosis not present

## 2015-06-26 DIAGNOSIS — E8809 Other disorders of plasma-protein metabolism, not elsewhere classified: Secondary | ICD-10-CM

## 2015-06-26 DIAGNOSIS — F4323 Adjustment disorder with mixed anxiety and depressed mood: Secondary | ICD-10-CM | POA: Diagnosis not present

## 2015-06-26 DIAGNOSIS — IMO0002 Reserved for concepts with insufficient information to code with codable children: Secondary | ICD-10-CM

## 2015-06-26 DIAGNOSIS — N390 Urinary tract infection, site not specified: Secondary | ICD-10-CM

## 2015-06-26 DIAGNOSIS — J45909 Unspecified asthma, uncomplicated: Secondary | ICD-10-CM

## 2015-06-26 DIAGNOSIS — K50013 Crohn's disease of small intestine with fistula: Secondary | ICD-10-CM | POA: Diagnosis not present

## 2015-06-26 DIAGNOSIS — Z6841 Body Mass Index (BMI) 40.0 and over, adult: Secondary | ICD-10-CM

## 2015-06-26 DIAGNOSIS — B9620 Unspecified Escherichia coli [E. coli] as the cause of diseases classified elsewhere: Secondary | ICD-10-CM

## 2015-06-26 DIAGNOSIS — K5660 Unspecified intestinal obstruction: Secondary | ICD-10-CM

## 2015-06-26 DIAGNOSIS — T8149XA Infection following a procedure, other surgical site, initial encounter: Secondary | ICD-10-CM

## 2015-06-26 DIAGNOSIS — R1110 Vomiting, unspecified: Secondary | ICD-10-CM

## 2015-06-26 DIAGNOSIS — K449 Diaphragmatic hernia without obstruction or gangrene: Secondary | ICD-10-CM

## 2015-06-26 DIAGNOSIS — N289 Disorder of kidney and ureter, unspecified: Secondary | ICD-10-CM | POA: Diagnosis not present

## 2015-06-26 DIAGNOSIS — D62 Acute posthemorrhagic anemia: Secondary | ICD-10-CM

## 2015-06-26 DIAGNOSIS — K50814 Crohn's disease of both small and large intestine with abscess: Secondary | ICD-10-CM | POA: Diagnosis not present

## 2015-06-26 DIAGNOSIS — I5021 Acute systolic (congestive) heart failure: Secondary | ICD-10-CM

## 2015-06-26 HISTORY — DX: Heart failure, unspecified: I50.9

## 2015-06-26 HISTORY — DX: Sickle-cell disease without crisis: D57.1

## 2015-06-26 LAB — GLUCOSE, CAPILLARY
GLUCOSE-CAPILLARY: 91 mg/dL (ref 65–99)
GLUCOSE-CAPILLARY: 84 mg/dL (ref 65–99)
Glucose-Capillary: 90 mg/dL (ref 65–99)
Glucose-Capillary: 102 mg/dL — ABNORMAL HIGH (ref 65–99)

## 2015-06-26 LAB — CBC
HCT: 25.9 % — ABNORMAL LOW (ref 36.0–46.0)
Hemoglobin: 7.9 g/dL — ABNORMAL LOW (ref 12.0–15.0)
MCH: 23 pg — ABNORMAL LOW (ref 26.0–34.0)
MCHC: 30.5 g/dL (ref 30.0–36.0)
MCV: 75.5 fL — AB (ref 78.0–100.0)
PLATELETS: 254 10*3/uL (ref 150–400)
RBC: 3.43 MIL/uL — AB (ref 3.87–5.11)
RDW: 28.8 % — ABNORMAL HIGH (ref 11.5–15.5)
WBC: 6.5 10*3/uL (ref 4.0–10.5)

## 2015-06-26 LAB — RENAL FUNCTION PANEL
ALBUMIN: 1.5 g/dL — AB (ref 3.5–5.0)
Anion gap: 5 (ref 5–15)
BUN: 23 mg/dL — AB (ref 6–20)
CO2: 27 mmol/L (ref 22–32)
Calcium: 7.5 mg/dL — ABNORMAL LOW (ref 8.9–10.3)
Chloride: 103 mmol/L (ref 101–111)
Creatinine, Ser: 1.09 mg/dL — ABNORMAL HIGH (ref 0.44–1.00)
GFR calc Af Amer: >60 mL/min (ref >60)
GLUCOSE: 87 mg/dL (ref 65–99)
PHOSPHORUS: 3.1 mg/dL (ref 2.5–4.6)
POTASSIUM: 3.4 mmol/L — AB (ref 3.5–5.1)
SODIUM: 135 mmol/L (ref 135–145)

## 2015-06-26 LAB — CREATININE, SERUM
CREATININE: 1.18 mg/dL — AB (ref 0.44–1.00)
GFR, EST NON AFRICAN AMERICAN: 59 mL/min — AB (ref >60)

## 2015-06-26 MED ORDER — SACCHAROMYCES BOULARDII 250 MG PO CAPS: 250.0000 mg | ORAL_CAPSULE | Freq: Two times a day (BID) | ORAL | Status: DC

## 2015-06-26 MED ORDER — HEPARIN SODIUM (PORCINE) 5000 UNIT/ML IJ SOLN
5000.0000 [IU] | Freq: Three times a day (TID) | INTRAMUSCULAR | Status: DC
  Administered 2015-06-26 – 2015-07-20 (×70): 5000 [IU] via SUBCUTANEOUS
  Filled 2015-06-26 (×70): qty 1

## 2015-06-26 MED ORDER — METOPROLOL SUCCINATE ER 50 MG PO TB24: 50.0000 mg | ORAL_TABLET | Freq: Every day | ORAL | Status: DC

## 2015-06-26 MED ORDER — SACCHAROMYCES BOULARDII 250 MG PO CAPS
250.0000 mg | ORAL_CAPSULE | Freq: Two times a day (BID) | ORAL | Status: DC
  Administered 2015-06-26 – 2015-07-19 (×42): 250 mg via ORAL
  Filled 2015-06-26 (×47): qty 1

## 2015-06-26 MED ORDER — METOPROLOL TARTRATE 50 MG PO TABS
50.0000 mg | ORAL_TABLET | Freq: Two times a day (BID) | ORAL | Status: DC
  Administered 2015-06-26 – 2015-07-19 (×39): 50 mg via ORAL
  Filled 2015-06-26 (×47): qty 1

## 2015-06-26 MED ORDER — HYDROCODONE-ACETAMINOPHEN 7.5-325 MG PO TABS
2.0000 | ORAL_TABLET | Freq: Four times a day (QID) | ORAL | Status: DC | PRN
  Administered 2015-06-26 – 2015-06-30 (×12): 2 via ORAL
  Filled 2015-06-26 (×12): qty 2

## 2015-06-26 MED ORDER — INSULIN ASPART 100 UNIT/ML ~~LOC~~ SOLN: 0.0000 [IU] | Freq: Three times a day (TID) | SUBCUTANEOUS | Status: DC

## 2015-06-26 MED ORDER — SODIUM CHLORIDE 0.9 % IJ SOLN
10.0000 mL | INTRAMUSCULAR | Status: DC | PRN
  Administered 2015-06-27 – 2015-06-28 (×3): 10 mL
  Administered 2015-06-29: 20 mL
  Administered 2015-07-03 – 2015-07-20 (×3): 10 mL
  Filled 2015-06-26 (×7): qty 40

## 2015-06-26 MED ORDER — MESALAMINE ER 250 MG PO CPCR: 500.0000 mg | ORAL_CAPSULE | Freq: Four times a day (QID) | ORAL | Status: DC

## 2015-06-26 MED ORDER — FUROSEMIDE 40 MG PO TABS
40.0000 mg | ORAL_TABLET | Freq: Two times a day (BID) | ORAL | Status: DC
Start: 2015-06-26 — End: 2015-07-25

## 2015-06-26 MED ORDER — ENSURE ENLIVE PO LIQD
237.0000 mL | Freq: Three times a day (TID) | ORAL | Status: DC
  Administered 2015-06-27 – 2015-07-16 (×34): 237 mL via ORAL

## 2015-06-26 MED ORDER — ONDANSETRON HCL 4 MG PO TABS
4.0000 mg | ORAL_TABLET | Freq: Four times a day (QID) | ORAL | Status: DC | PRN
  Administered 2015-06-27 – 2015-07-19 (×7): 4 mg via ORAL
  Filled 2015-06-26 (×9): qty 1

## 2015-06-26 MED ORDER — PANTOPRAZOLE SODIUM 40 MG PO TBEC
40.0000 mg | DELAYED_RELEASE_TABLET | Freq: Every day | ORAL | Status: DC
  Administered 2015-06-27 – 2015-07-20 (×22): 40 mg via ORAL
  Filled 2015-06-26 (×23): qty 1

## 2015-06-26 MED ORDER — HEPARIN SODIUM (PORCINE) 5000 UNIT/ML IJ SOLN: 5000.0000 [IU] | Freq: Three times a day (TID) | INTRAMUSCULAR | Status: DC

## 2015-06-26 MED ORDER — ONDANSETRON HCL 4 MG/2ML IJ SOLN
4.0000 mg | Freq: Four times a day (QID) | INTRAMUSCULAR | Status: DC | PRN
  Administered 2015-07-13 – 2015-07-21 (×12): 4 mg via INTRAVENOUS
  Filled 2015-06-26 (×12): qty 2

## 2015-06-26 MED ORDER — METOCLOPRAMIDE HCL 5 MG PO TABS
5.0000 mg | ORAL_TABLET | Freq: Three times a day (TID) | ORAL | Status: DC
  Administered 2015-06-27 – 2015-07-21 (×66): 5 mg via ORAL
  Filled 2015-06-26 (×70): qty 1

## 2015-06-26 MED ORDER — POLYETHYLENE GLYCOL 3350 17 G PO PACK: 17.0000 g | PACK | Freq: Every day | ORAL | Status: DC

## 2015-06-26 MED ORDER — SORBITOL 70 % SOLN: 30.0000 mL | Freq: Every day | Status: DC | PRN

## 2015-06-26 MED ORDER — FUROSEMIDE 40 MG PO TABS
40.0000 mg | ORAL_TABLET | Freq: Two times a day (BID) | ORAL | Status: DC
  Administered 2015-06-27 – 2015-07-20 (×43): 40 mg via ORAL
  Filled 2015-06-26 (×45): qty 1

## 2015-06-26 MED ORDER — POLYETHYLENE GLYCOL 3350 17 G PO PACK
17.0000 g | PACK | Freq: Every day | ORAL | Status: DC
  Administered 2015-06-27 – 2015-07-13 (×7): 17 g via ORAL
  Filled 2015-06-26 (×17): qty 1

## 2015-06-26 MED ORDER — GUAIFENESIN ER 600 MG PO TB12: 1200.0000 mg | ORAL_TABLET | Freq: Two times a day (BID) | ORAL | Status: DC | PRN

## 2015-06-26 NOTE — Progress Notes (Signed)
Dominique Staggers, MD Physician Signed Physical Medicine and Rehabilitation Consult Note 06/16/2015 10:40 AM  Related encounter: ED to Hosp-Admission (Current) from 06/03/2015 in [place] Collapse All        Physical Medicine and Rehabilitation Consult Reason for Consult: Debilitation after perforated colon/respiratory failure Referring Physician: Critical care   HPI: Dominique [name] is a 34 y.o. right handed morbidly obese female admitted 06/04/2015 with noted history of asthma and recent recurrent colitis since January 2016. Patient lives with family independent prior to admission. Presented with severe abdominal pain with occasional vomiting and nausea. Placed on intravenous Cipro and Flagyl. Underwent sigmoidoscopy 06/05/2015 showing severe chronic inflammatory changes in the proximal sigmoid and descending colon. Biopsies diagnosed with Crohn disease. CT angiogram and imaging revealed free intraperitoneal air/perforated viscus likely perforated left colon from Crohn's colitis. Underwent exploratory laparotomy drainage of intra-abdominal abscess partial colon resection application of wound VAC 06/09/2015 per Dr. Grandville Silos followed by reexploration of abdomen with closure creation of colostomy 06/11/2015.. Follow-up wound care nurse for ostomy care. Hospital course respiratory failure required intubation followed by critical care medicine. She was extubated 06/14/2015. Hospital course acute blood loss anemia 7.0 and monitored. Acute renal insufficiency creatinine 2.64-2.84. Renal ultrasound showing no hydronephrosis. Subcutaneous heparin for DVT prophylaxis. Patient currently remains nothing by mouth. Physical therapy evaluation completed 06/15/2015 with recommendations of physical medicine rehabilitation consult.   Review of Systems  Constitutional: Negative for fever and chills.  HENT: Negative for hearing loss.  Respiratory: Positive for  shortness of breath.  Cardiovascular: Negative for chest pain, palpitations and leg swelling.  Gastrointestinal: Positive for nausea, vomiting, abdominal pain and diarrhea.  Genitourinary: Negative for dysuria and hematuria.  Skin: Negative for rash.  Neurological: Negative for dizziness, seizures, loss of consciousness and headaches.   Past Medical History  Diagnosis Date  . Asthma   . Sleep apnea   . Anemia   . Morbid obesity 03/22/2009    Qualifier: Diagnosis of By: Ronnald Ramp CMA, [place]   . OSA (obstructive sleep apnea) 05/02/2014  . Thyromegaly 05/02/2014  . Seasonal allergies 06/13/2014  . Acute acalculous cholecystitis 11/16/2014  . Diverticulitis of colon 11/17/2014  . Hepatic steatosis 11/17/2014   Past Surgical History  Procedure Laterality Date  . C section 2011  2011  . Flexible sigmoidoscopy N/A 06/05/2015    Procedure: FLEXIBLE SIGMOIDOSCOPY; Surgeon: Ronald Lobo, MD; Location: [facility]; Service: Endoscopy; Laterality: N/A;  . Laparotomy N/A 06/09/2015    Procedure: EXPLORATORY LAPAROTOMY, DRAINAGE OF INTRAABDOMINAL ABSCESS, PARTIAL COLON RESECTION, APPLICATION OF WOUND VAC; Surgeon: Georganna Skeans, MD; Location: [facility]; Service: General; Laterality: N/A;  . Laparotomy N/A 06/11/2015    Procedure: RE-EXPLORATION OF ABDOMEN; Surgeon: Georganna Skeans, MD; Location: [facility]; Service: General; Laterality: N/A;  . Colostomy N/A 06/11/2015    Procedure: CREATION OF COLOSTOMY; Surgeon: Georganna Skeans, MD; Location: [facility] OR; Service: General; Laterality: N/A;   Family History  Problem Relation Age of Onset  . Hypertension Mother   . Hypertension Father   . Hypertension Sister   . Asthma Brother   . Diabetes Maternal Grandmother   . Hypertension Maternal Grandmother   . Hypertension Maternal Grandfather   . Cancer Paternal Grandmother     lung  .  Emphysema Paternal Grandfather   . Allergies Mother    Social History:  reports that she has quit smoking. Her smoking use included Cigarettes. She started smoking about 10 months ago. She has a 2 pack-year smoking  history. She has never used smokeless tobacco. She reports that she does not drink alcohol or use illicit drugs. Allergies:  Allergies  Allergen Reactions  . Other Shortness Of Breath and Swelling    Tree nuts  . Penicillins     Unknown childhood allergy   Medications Prior to Admission  Medication Sig Dispense Refill  . ferrous sulfate 325 (65 FE) MG tablet Take 2 tablets (650 mg total) by mouth daily with breakfast. 60 tablet 3  . ondansetron (ZOFRAN) 4 MG tablet Take 1 tablet (4 mg total) by mouth every 6 (six) hours. 12 tablet 0  . [DISCONTINUED] ciprofloxacin (CIPRO) 500 MG tablet Take 1 tablet (500 mg total) by mouth 2 (two) times daily. One po bid x 7 days 14 tablet 0  . [DISCONTINUED] HYDROcodone-acetaminophen (NORCO/VICODIN) 5-325 MG per tablet TAKE 1 TABLET BY MOUTH EVERY 6 HOURS AS NEEDED FOR PAIN  0  . [DISCONTINUED] metroNIDAZOLE (FLAGYL) 500 MG tablet Take 1 tablet (500 mg total) by mouth 3 (three) times daily. One po tid x 7 days 21 tablet 0  . albuterol (PROVENTIL) (2.5 MG/3ML) 0.083% nebulizer solution Take 3 mLs (2.5 mg total) by nebulization every 6 (six) hours as needed for wheezing or shortness of breath. (Patient not taking: Reported on 06/03/2015) 360 mL 0  . PROAIR HFA 108 (90 BASE) MCG/ACT inhaler INHALE 2 PUFFS BY MOUTH INTO THE LUNGS EVERY 6 HOURS AS NEEDED FOR WHEEZING OR SHORTNESS OF BREATH 8.5 g 3  . [DISCONTINUED] beclomethasone (QVAR) 80 MCG/ACT inhaler Inhale 2 puffs into the lungs 2 (two) times daily. (Patient not taking: Reported on 06/03/2015) 1 Inhaler 12  . [DISCONTINUED] cyclobenzaprine (FLEXERIL) 10 MG tablet Take 1 tablet (10 mg total) by mouth at bedtime. (Patient not taking:  Reported on 05/25/2015) 30 tablet 1  . [DISCONTINUED] pantoprazole (PROTONIX) 40 MG tablet Take 1 tablet (40 mg total) by mouth daily. (Patient not taking: Reported on 05/20/2015) 30 tablet 1  . [DISCONTINUED] ranitidine (ZANTAC) 150 MG capsule Take 1 capsule (150 mg total) by mouth 2 (two) times daily. (Patient not taking: Reported on 05/20/2015) 60 capsule 0  . [DISCONTINUED] torsemide (DEMADEX) 20 MG tablet Take 1 tablet (20 mg total) by mouth daily. (Patient not taking: Reported on 06/03/2015) 30 tablet 3    Home: Home Living Family/patient expects to be discharged to:: Private residence Living Arrangements: Children, Parent (7 yo dtr "Freedom") Available Help at Discharge: Family Type of Home: Apartment Home Access: Stairs to enter Technical brewer of Steps: 8 Entrance Stairs-Rails: Right, Left, Can reach both Home Layout: One level Bathroom Shower/Tub: Chiropodist: Standard Home Equipment: None  Functional History: Prior Function Level of Independence: Independent Comments: reports she was weak due to illness last several days at home, but did ascend 8 steps to parking lot and came to hospital via car Functional Status:  Mobility: Bed Mobility Overal bed mobility: Needs Assistance Bed Mobility: Rolling, Sidelying to Sit, Sit to Supine Rolling: Mod assist (+3) Sidelying to sit: Max assist (+4 using deflated hover mat/handles as a sling to raise tors) Sit to supine: Max assist (+4) General bed mobility comments: hover mat initially helpful with scooting laterally and up in bed. pt assisted into near-sidelying on Lt, air mattress deflated as she came up to sit (using deflated hover pad as a sling to raise torso); rolled numerous times Rt and Lt for re-centering linens/hover mat Transfers Overall transfer level: Needs assistance Equipment used: Rolling walker (2 wheeled) (bariatric) Transfers: Sit to/from  Stand General transfer comment:  attempted numerous times with little to no effort from pt due to fear of pain and falling; unable to even clear buttocks off bed      ADL:    Cognition: Cognition Overall Cognitive Status: Within Functional Limits for tasks assessed Orientation Level: Oriented X4 Cognition Arousal/Alertness: Awake/alert Behavior During Therapy: Flat affect, Anxious Overall Cognitive Status: Within Functional Limits for tasks assessed  Blood pressure 118/59, pulse 96, temperature 97.9 F (36.6 C), temperature source Oral, resp. rate 17, height 5' 2"  (1.575 m), weight 167.009 kg (368 lb 3 oz), last menstrual period 05/06/2015, SpO2 95 %. Physical Exam  Constitutional: She is oriented to person, place, and time.  34 year old African-American morbidly obese female  HENT:  Head: Normocephalic.  Eyes: EOM are normal.  Neck: Normal range of motion. Neck supple. No thyromegaly present.  Cardiovascular: Normal rate and regular rhythm.  Respiratory: Effort normal and breath sounds normal. No respiratory distress.  GI: Soft.  New abdominal dressing applied  Neurological: She is alert and oriented to person, place, and time.     Lab Results Last 24 Hours    Results for orders placed or performed during the hospital encounter of 06/03/15 (from the past 24 hour(s))  Glucose, capillary Status: Abnormal   Collection Time: 06/15/15 11:59 AM  Result Value Ref Range   Glucose-Capillary 107 (H) 65 - 99 mg/dL   Comment 1 Capillary Specimen    Comment 2 Notify RN   Hemoglobin and hematocrit, blood Status: Abnormal   Collection Time: 06/15/15 2:50 PM  Result Value Ref Range   Hemoglobin 7.7 (L) 12.0 - 15.0 g/dL   HCT 24.7 (L) 36.0 - 46.0 %  Glucose, capillary Status: None   Collection Time: 06/15/15 4:26 PM  Result Value Ref Range   Glucose-Capillary 96 65 - 99 mg/dL  Glucose, capillary Status: None   Collection Time: 06/15/15 7:21 PM    Result Value Ref Range   Glucose-Capillary 91 65 - 99 mg/dL   Comment 1 Capillary Specimen    Comment 2 Notify RN    Comment 3 Document in Chart   Glucose, capillary Status: Abnormal   Collection Time: 06/15/15 11:24 PM  Result Value Ref Range   Glucose-Capillary 100 (H) 65 - 99 mg/dL   Comment 1 Capillary Specimen    Comment 2 Notify RN    Comment 3 Document in Chart   Prealbumin Status: Abnormal   Collection Time: 06/16/15 3:50 AM  Result Value Ref Range   Prealbumin 3.5 (L) 18 - 38 mg/dL  Renal function panel Status: Abnormal   Collection Time: 06/16/15 3:50 AM  Result Value Ref Range   Sodium 132 (L) 135 - 145 mmol/L   Potassium 4.7 3.5 - 5.1 mmol/L   Chloride 105 101 - 111 mmol/L   CO2 21 (L) 22 - 32 mmol/L   Glucose, Bld 99 65 - 99 mg/dL   BUN 45 (H) 6 - 20 mg/dL   Creatinine, Ser 2.75 (H) 0.44 - 1.00 mg/dL   Calcium 7.2 (L) 8.9 - 10.3 mg/dL   Phosphorus 5.9 (H) 2.5 - 4.6 mg/dL   Albumin 1.3 (L) 3.5 - 5.0 g/dL   GFR calc non Af Amer 21 (L) >60 mL/min   GFR calc Af Amer 25 (L) >60 mL/min   Anion gap 6 5 - 15  CBC Status: Abnormal   Collection Time: 06/16/15 3:50 AM  Result Value Ref Range   WBC 23.8 (H) 4.0 - 10.5 K/uL  RBC 3.67 (L) 3.87 - 5.11 MIL/uL   Hemoglobin 7.0 (L) 12.0 - 15.0 g/dL   HCT 22.0 (L) 36.0 - 46.0 %   MCV 59.9 (L) 78.0 - 100.0 fL   MCH 19.1 (L) 26.0 - 34.0 pg   MCHC 31.8 30.0 - 36.0 g/dL   RDW 21.8 (H) 11.5 - 15.5 %   Platelets 196 150 - 400 K/uL  Glucose, capillary Status: None   Collection Time: 06/16/15 3:55 AM  Result Value Ref Range   Glucose-Capillary 96 65 - 99 mg/dL   Comment 1 Venous Specimen    Comment 2 Notify RN    Comment 3 Document in Chart   Glucose, capillary Status: None   Collection Time: 06/16/15 7:46 AM  Result  Value Ref Range   Glucose-Capillary 97 65 - 99 mg/dL      Imaging Results (Last 48 hours)    US Renal  06/15/2015 CLINICAL DATA: Renal failure. EXAM: RENAL / URINARY TRACT ULTRASOUND COMPLETE COMPARISON: CT abdomen and pelvis 06/04/2015 FINDINGS: Right Kidney: Length: 10.4 cm. Echogenicity within normal limits. No mass or hydronephrosis visualized. Left Kidney: Length: 12.9 cm. Limited visualization due to overlying bowel gas and rib shadowing. No evidence of hydronephrosis. Bladder: Bladder could not be examined due to bandages and open wound. IMPRESSION: Limited visualization. No evidence of hydronephrosis in the kidneys. Bladder was not evaluated. Electronically Signed By: Lucienne Capers M.D. On: 06/15/2015 02:19     Assessment/Plan: Diagnosis: debility after ex lap, ventilator dependent respiratory failure 1. Does the need for close, 24 hr/day medical supervision in concert with the patient's rehab needs make it unreasonable for this patient to be served in a less intensive setting? Yes 2. Co-Morbidities requiring supervision/potential complications: morbid obesity, wound care 3. Due to bladder management, bowel management, safety, skin/wound care, disease management, medication administration, pain management and patient education, does the patient require 24 hr/day rehab nursing? Yes 4. Does the patient require coordinated care of a physician, rehab nurse, PT (1-2 hrs/day, 5 days/week) and OT (1-2 hrs/day, 5 days/week) to address physical and functional deficits in the context of the above medical diagnosis(es)? Yes Addressing deficits in the following areas: balance, endurance, locomotion, strength, transferring, bowel/bladder control, bathing, dressing, feeding, grooming, toileting and psychosocial support 5. Can the patient actively participate in an intensive therapy program of at least 3 hrs of therapy per day at least 5 days per week? Yes 6. The potential for  patient to make measurable gains while on inpatient rehab is excellent 7. Anticipated functional outcomes upon discharge from inpatient rehab are modified independent with PT, modified independent and supervision with OT, n/a with SLP. 8. Estimated rehab length of stay to reach the above functional goals is: 10-15 days 9. Does the patient have adequate social supports and living environment to accommodate these discharge functional goals? Yes 10. Anticipated D/C setting: Home 11. Anticipated post D/C treatments: [place] therapy 12. Overall Rehab/Functional Prognosis: excellent  RECOMMENDATIONS: This patient's condition is appropriate for continued rehabilitative care in the following setting: CIR Patient has agreed to participate in recommended program. Potentially Note that insurance prior authorization may be required for reimbursement for recommended care.  Comment: Rehab Admissions Coordinator to follow up.  Thanks,  Dominique Staggers, MD, Mellody Drown     06/16/2015       Revision History     Date/Time User Provider Type Action   06/16/2015 1:50 PM Dominique Staggers, MD Physician Sign   06/16/2015 11:09 AM Cathlyn Parsons, PA-C Physician Assistant  [place]   View Details Report       Routing History     Date/Time From To Method   06/16/2015 1:50 PM Dominique Staggers, MD Dominique Staggers, MD In Basket   06/16/2015 1:50 PM Dominique Staggers, MD Benito Mccreedy, MD Fax

## 2015-06-26 NOTE — Progress Notes (Signed)
Rehab admissions - Patient has been medically cleared for acute inpatient rehab admission.  I spoke with her today and she is in agreement to inpatient rehab.  Bed available and will admit to inpatient rehab today.  Call me for questions.  #[phone]

## 2015-06-26 NOTE — Progress Notes (Signed)
Patient Name: Dominique [name] Date of Encounter: 06/26/2015  Primary Cardiologist: new - Dr. Haroldine Laws   Principal Problem:   Colitis Active Problems:   Morbid obesity- BMI 67.5   Iron deficiency anemia   Pressure ulcer   Acute respiratory failure with hypoxia (HCC)   Crohn's disease ([place])   Perforated sigmoid colon ([place])   Peritonitis ([place])   Peritoneal abscess (HCC)   OSA on CPAP   Obesity hypoventilation syndrome (HCC)   Acute renal insufficiency   Acute encephalopathy   Pulmonary hypertension (HCC)   Cardiomyopathy- etiology not yet determined   Acute renal failure syndrome ([place])    SUBJECTIVE  Denies any CP or SOB, some abdominal soreness  CURRENT MEDS . feeding supplement (ENSURE ENLIVE)  237 mL Oral TID BM  . furosemide  40 mg Oral BID  . heparin subcutaneous  5,000 Units Subcutaneous 3 times per day  . Influenza vac split quadrivalent PF  0.5 mL Intramuscular Tomorrow-1000  . insulin aspart  0-15 Units Subcutaneous TID WC  . insulin aspart  0-5 Units Subcutaneous QHS  . mesalamine  500 mg Oral QID  . metoCLOPramide  5 mg Oral TID AC  . metoprolol tartrate  50 mg Oral BID  . pantoprazole  40 mg Oral Daily  . pneumococcal 13-valent conjugate vaccine  0.5 mL Intramuscular Tomorrow-1000  . polyethylene glycol  17 g Oral Daily  . saccharomyces boulardii  250 mg Oral BID  . sodium chloride  10-40 mL Intracatheter Q12H    OBJECTIVE  Filed Vitals:   06/25/15 1445 06/25/15 2014 06/25/15 2138 06/26/15 0547  BP: 110/58 119/74 119/74 99/51  Pulse: 83 98 90 91  Temp: 98 F (36.7 C) 98.5 F (36.9 C)  98.7 F (37.1 C)  TempSrc: Oral Oral  Oral  Resp:  18  18  Height:      Weight:      SpO2: 100% 100%  100%    Intake/Output Summary (Last 24 hours) at 06/26/15 1109 Last data filed at 06/26/15 0231  Gross per 24 hour  Intake    697 ml  Output    880 ml  Net   -183 ml   Filed Weights   06/03/15 1807  Weight: 368 lb 3 oz (167.009 kg)    PHYSICAL  EXAM  General: Pleasant, NAD. Neuro: Alert and oriented X 3. Moves all extremities spontaneously. Psych: Normal affect. HEENT:  Normal  Neck: Supple without bruits or JVD. Lungs:  Resp regular and unlabored, CTA. Heart: RRR no s3, s4, or murmurs. Abdomen: Covered in dressing, serosanguinous drainage in ostomy bag Extremities: No clubbing, cyanosis or edema. DP/PT/Radials 2+ and equal bilaterally.  Accessory Clinical Findings  CBC  Recent Labs  06/24/15 0502 06/24/15 2010  WBC 12.1* 10.3  HGB 8.4* 8.5*  HCT 28.0* 28.7*  MCV 74.7* 74.4*  PLT 299 704   Basic Metabolic Panel  Recent Labs  06/25/15 0427 06/26/15 0540  NA 134* 135  K 3.5 3.4*  CL 103 103  CO2 27 27  GLUCOSE 95 87  BUN 27* 23*  CREATININE 1.20* 1.09*  CALCIUM 7.6* 7.5*  PHOS 3.1 3.1   Liver Function Tests  Recent Labs  06/25/15 0427 06/26/15 0540  ALBUMIN 1.6* 1.5*    TELE NSR with HR 80s, 1 episode of 3 beats run of NSVT    ECG  No new EKG  Echocardiogram 06/18/2015  LV EF: 20%  ------------------------------------------------------------------- Indications:   Supraventricular tachycardia 427.0.  ------------------------------------------------------------------- History:  Risk  factors: Obstructive sleep apnea.  ------------------------------------------------------------------- Study Conclusions  - Left ventricle: The cavity size was moderately dilated. Wall thickness was increased in a pattern of mild LVH. Systolic function was severely reduced. The estimated ejection fraction was 20%. Diffuse hypokinesis. The study is not technically sufficient to allow evaluation of LV diastolic function. - Ventricular septum: Septal motion showed paradox. - Left atrium: Mildly dilated at 35 ml/m2. - Right ventricle: The cavity size was mildly dilated. The moderator band was prominent. Systolic function was normal. - Right atrium: The atrium was mildly dilated. - Tricuspid  valve: There was mild regurgitation. - Pulmonary arteries: PA peak pressure: 40 mm Hg (S) + RAP. - Pericardium, extracardiac: A trivial pericardial effusion was identified.  Impressions:  - LVEF 20%, diffuse hypokinesis, moderately dilated LV with mild LV wall thickening, paradoxical septal motion, mildly dilated LA, mild TR, RVSP 40 mmHg + RAP, trivial pericardial effusion. Compared to a prior echo in 2011, the EF is now significantly decreased from previously being normal.    Radiology/Studies  Ct Abdomen Pelvis Wo Contrast  06/23/2015   CLINICAL DATA:  Wound infection. History of perforated left colon with peritonitis and peritoneal abscess. Crohn's disease . Laparotomy 06/11/2015 and 06/09/2015  EXAM: CT ABDOMEN AND PELVIS WITHOUT CONTRAST  TECHNIQUE: Multidetector CT imaging of the abdomen and pelvis was performed following the standard protocol without IV contrast.  COMPARISON:  CT 06/04/2015  FINDINGS: Lower chest: New mild bibasilar atelectasis without pleural effusion.  Hepatobiliary: Limited evaluation of the liver due to her large patient size and lack of intravenous contrast. No liver lesion. Gallbladder bile ducts intact.  Pancreas: Mild pancreatic enlargement compared to the prior study with mild edema below the uncinate process. Possible pancreatitis.  Spleen: Negative  Adrenals/Urinary Tract: Negative for renal obstruction or stone.  Stomach/Bowel: Negative for bowel obstruction. No significant ileus. Left colostomy. Question mild edema in the right and transverse colon, not present previously. This could be due to colitis or possibly lack of adequate distention.  No intra-abdominal abscess.  Vascular/Lymphatic: Negative  Reproductive: Normal uterus.  No adnexal mass.  Other: Small amount of free fluid in the left pelvis.  No abscess.  Open wound in the midline down to the fascia. No abscess in the soft tissues.  Musculoskeletal: Negative  IMPRESSION: Postop left colectomy  with left colostomy. Mild thickening of the right and transverse colon may reflect colitis. These areas did not show thickening on the prior study and therefore not likely to represent Crohn's colitis.  Small amount of free fluid in the left pelvis.  Negative for abscess  Open wound in the anterior abdominal wall.  Question mild pancreatitis with mild pancreatic edema. Limited evaluation due to lack of IV contrast large patient size.  Mild atelectasis in the lung bases.   Electronically Signed   By: Franchot Gallo M.D.   On: 06/23/2015 19:53   Ct Angio Chest Pe W/cm &/or Wo Cm  06/09/2015   CLINICAL DATA:  Shortness of breath, abdominal pain, Crohn's disease  EXAM: CT ANGIOGRAPHY CHEST WITH CONTRAST  TECHNIQUE: Multidetector CT imaging of the chest was performed using the standard protocol during bolus administration of intravenous contrast. Multiplanar CT image reconstructions and MIPs were obtained to evaluate the vascular anatomy.  CONTRAST:  170m OMNIPAQUE IOHEXOL 350 MG/ML SOLN  COMPARISON:  None.  FINDINGS: The study is markedly limited by extensive streak artifacts from patient's large body habitus. No gross central pulmonary embolus is noted. There is fatty infiltration of the liver.  No aortic aneurysm. Evaluation of lobar and segmental arterial branches is nondiagnostic for pulmonary embolus.  The visualized upper abdomen shows free air anterior abdomen please see axial image 59 highly suspicious for perforated viscus. Clinical correlation is necessary. Further correlation with CT scan of the abdomen and pelvis is recommended.  There is nodular enlargement of right lobe of thyroid gland measures 4.2 cm. Further correlation with thyroid gland ultrasound is recommended to exclude a mass. There is no mediastinal hematoma or adenopathy. No hilar adenopathy.  Images of the lung parenchyma shows no acute infiltrate or pulmonary edema.  Review of the MIP images confirms the above findings.  IMPRESSION: 1.  Significant limited study by patient's large body habitus. No central pulmonary embolus is noted. 2. No acute infiltrate or pulmonary edema. No mediastinal hematoma or adenopathy. 3. There is free abdominal air in upper abdomen highly suspicious for perforated viscus. Clinical correlation is necessary. 4. There is 4.2 cm nodule lesion in right lobe of thyroid gland. Further evaluation with thyroid gland ultrasound is recommended. Critical Value/emergent results were called by telephone at the time of interpretation on 06/09/2015 at 6:02 pm to Dr. Nita Sells , who verbally acknowledged these results.   Electronically Signed   By: Lahoma Crocker M.D.   On: 06/09/2015 18:02   Ct Abdomen Pelvis W Contrast  06/04/2015   CLINICAL DATA:  Nausea, vomiting, diarrhea, and fever for 3 weeks. Diffuse abdominal pain more in the right lower quadrant.  EXAM: CT ABDOMEN AND PELVIS WITH CONTRAST  TECHNIQUE: Multidetector CT imaging of the abdomen and pelvis was performed using the standard protocol following bolus administration of intravenous contrast.  CONTRAST:  115m OMNIPAQUE IOHEXOL 300 MG/ML  SOLN  COMPARISON:  [place]/25/2015 [place] Lung bases are clear.  Diffuse fatty infiltration of the liver. Gallbladder, spleen, pancreas, adrenal glands, kidneys, abdominal aorta, inferior vena cava, and retroperitoneal lymph nodes are unremarkable. Stomach and small bowel are decompressed. Contrast material is present in the colon suggesting no evidence of small bowel obstruction. There is diffuse colonic wall thickening and diffuse infiltration in the pericolonic fat extending from the splenic flexure along the descending colon and to the sigmoid colon. This is consistent with colitis and could represent inflammatory or infectious etiologies. The amount of pericolonic infiltration is increasing since the previous study, vertically around the sigmoid region. No free air or free fluid in the abdomen.  Abdominal wall musculature appears intact.  Pelvis: Bladder wall is not thickened. Uterus and ovaries are not enlarged. Appendix is normal. No pelvic mass or lymphadenopathy. No destructive bone lesions.  IMPRESSION: Wall thickening and prominent pericolonic infiltration around the descending and sigmoid colon consistent with regional colitis. Inflammatory reaction is increasing since previous study. No discrete abscess identified.   Electronically Signed   By: WLucienne CapersM.D.   On: 06/04/2015 04:35   [place]  06/15/2015   CLINICAL DATA:  Renal failure.  EXAM: RENAL / URINARY TRACT ULTRASOUND COMPLETE  COMPARISON:  CT abdomen and pelvis 06/04/2015  FINDINGS: Right Kidney:  Length: 10.4 cm. Echogenicity within normal limits. No mass or hydronephrosis visualized.  Left Kidney:  Length: 12.9 cm. Limited visualization due to overlying bowel gas and rib shadowing. No evidence of hydronephrosis.  Bladder:  Bladder could not be examined due to bandages and open wound.  IMPRESSION: Limited visualization. No evidence of hydronephrosis in the kidneys. Bladder was not evaluated.   Electronically Signed   By: WOren BeckmannD.  On: 06/15/2015 02:19   Dg Chest Port 1 View  06/14/2015   CLINICAL DATA:  Ventilator dependent respiratory failure  EXAM: PORTABLE CHEST - 1 VIEW  COMPARISON:  Portable chest x-ray of 06/13/2015  FINDINGS: The lungs are slightly better aerated. The tip of the endotracheal tube is in now approximately 3.4 cm above the carina. Right IJ central venous line tip overlies the expected SVC -RA junction. No pneumothorax is seen. Cardiomegaly is stable. NG tube extends below the hemidiaphragm.  IMPRESSION: 1. Slightly better aeration. 2. Tip of endotracheal tube 3.4 cm above the carina.   Electronically Signed   By: Ivar Drape M.D.   On: 06/14/2015 08:12   Dg Chest Port 1 View  06/13/2015   CLINICAL DATA:  Respiratory failure.  EXAM: PORTABLE CHEST - 1 VIEW  COMPARISON:  None.  FINDINGS:  Endotracheal tube, NG tube, right IJ line in stable position. Cardiomegaly with normal pulmonary vascularity. Low lung volumes. No pleural effusion or pneumothorax.  IMPRESSION: 1. Lines and tubes in stable position. 2. Stable cardiomegaly. 3. Stable low lung volumes.  No focal alveolar infiltrate.   Electronically Signed   By: Marcello Moores  Register   On: 06/13/2015 07:50   Dg Chest Port 1 View  06/12/2015   CLINICAL DATA:  Respiratory failure.  EXAM: PORTABLE CHEST - 1 VIEW  COMPARISON:  06/09/2015.  FINDINGS: Patient is rotated to the left. Endotracheal tube and NG tube in stable position. Mediastinum hilar structures are normal. Cardiomegaly with normal pulmonary vascularity. Low lung volumes. No prominent pleural effusion. No pneumothorax.  IMPRESSION: 1. Lines and tubes in stable position. 2. Stable cardiomegaly. 3. Low lung volumes.   Electronically Signed   By: Marcello Moores  Register   On: 06/12/2015 07:32   Dg Chest Port 1 View  06/09/2015   CLINICAL DATA:  Central line placement  EXAM: PORTABLE CHEST - 1 VIEW  COMPARISON:  08/22/2014  FINDINGS: Cardiomediastinal silhouette is stable. Endotracheal tube in place with tip 4.8 cm above the carina. NG tube in place. There is right IJ central line with tip in SVC right atrium junction. No pulmonary edema. No segmental infiltrate. No pneumothorax.  IMPRESSION: Endotracheal and NG tube in place. Right IJ central line with tip in SVC right atrium junction. No pneumothorax.   Electronically Signed   By: Lahoma Crocker M.D.   On: 06/09/2015 23:04    ASSESSMENT AND PLAN  34 yo super morbidly obese female presented on 9/11 with abdominal pain and diagnosed with Chron's dx with perforated descending colon and abscess, she underwent partial colectomy and ex lap on 9/16, postop course complicated by peritonitis and sepsis. She underwent surgery again on 9/18 colostomy and abd closure. She also had acute renal failure, resp failure and encephalopathy. Echo on 9/25 showed EF 20%  with global HK. Cardiology consulted. Since then, her ostomy site suture has failed.   1. Newly diagnosed cardiomyopathy, likely related to acute stress  - Echo 07/12/2010 EF 60-65%  - Echo 06/18/2015 EF 20%, diffuse hypokinesis, ventricular septal paradox noted, PA peak pressure 68mHg + RAP  - continue on metoprolol 560mBID, unable to add hydralazine and Imdur. May consider consider switch to metoprolol tartrate at some point.  - likely to obtain limited echo either this week or next to see if any improvement in EF.   - euvolemic on exam  2. Newly diagnosed crohn's dx with perforated descending colon and peritoneal abscess  - confirmed via flex sigmoidoscopy 9/12 with biopsy consistent with Crohn's  dx  - partial colectomy and ex lap on 9/16    - 9/18 colostomy and abd closure  - 9/30 noted to have failing sutures at ostomy site, currently ostomy floating and fascia separated.   3. Acute renal failure: largely resolved.   4. Anemia: stable.  Hilbert Corrigan PA-C Pager: [phone]  Patient seen, examined. Available data reviewed. Agree with findings, assessment, and plan as outlined by Almyra Deforest, PA-C. Clinically the patient looks good. Heart rate now 90 bpm, tele shows sinus rhythm. Renal fxn improving and she feels better. I don't appreciate leg swelling on exam although somewhat difficult because of her body habitus. Would continue beta-blocker and oral lasix for now. Repeat echo later this week. Will follow from a distance.   Sherren Mocha, M.D. 06/26/2015 12:06 PM

## 2015-06-26 NOTE — Progress Notes (Signed)
Patient has refused CPAP again for the night. RT will continue to monitor.

## 2015-06-26 NOTE — Telephone Encounter (Signed)
TCM  10/10 w/ Cecille Rubin @ 8:30 per Isaac Laud

## 2015-06-26 NOTE — Interval H&P Note (Signed)
Dominique [name] was admitted today to Inpatient Rehabilitation with the diagnosis of debilitation/Crohn's disease/perforated left colon with peritonitis/exploratory laparotomy with drainage of intra-abdominal abscess partial colon resection with colostomy.  The patient's history has been reviewed, patient examined, and there is no change in status.  Patient continues to be appropriate for intensive inpatient rehabilitation.  I have reviewed the patient's chart and labs.  Questions were answered to the patient's satisfaction. The PAPE has been reviewed and assessment remains appropriate.  Ankit Lorie Phenix 06/26/2015, 8:24 PM

## 2015-06-26 NOTE — Progress Notes (Signed)
Occupational Therapy Treatment Patient Details Name: Yanin Muhlestein MRN: [id number] DOB: [birth year] Today's Date: 06/26/2015    History of present illness Adm 9/10 for colitis 9/16 detected perforated descending colon >>laparotomy, drain of abscess, partial colectomy, wound vac; remained on vent post op 9/18 To OR >> wound closed, colostomy created 9/21 Extubated, off pressors   OT comments  Pt reports feeling much better since transferred from SDU to 6N on Thursday, able to sleep well.  Pt reports having stayed up in chair x 5 hours on Saturday and having visit from her family.  Pt now with adequate UB strength to open containers on meal tray and for ADL.  Instructed and performed UB exercises with level 1 theraband with pt able to return demonstration so she may perform throughout the day.    Follow Up Recommendations  CIR    Equipment Recommendations       Recommendations for Other Services      Precautions / Restrictions Precautions Precautions: Fall Precaution Comments: abdominal wound, colostomy Other Brace/Splint: abd binder when up       Mobility Bed Mobility                  Transfers                      Balance                                   ADL                                                Vision                     Perception     Praxis      Cognition   Behavior During Therapy: WFL for tasks assessed/performed Overall Cognitive Status: Within Functional Limits for tasks assessed                       Extremity/Trunk Assessment               Exercises General Exercises - Upper Extremity Shoulder Flexion: Strengthening;Both;20 reps;Supine;Theraband Theraband Level (Shoulder Flexion): Level 1 (Yellow) Shoulder Extension: Strengthening;Both;20 reps;Supine;Theraband Theraband Level (Shoulder Extension): Level 1 (Yellow) Shoulder Horizontal ABduction: Strengthening;Both;20  reps;Supine;Theraband Theraband Level (Shoulder Horizontal Abduction): Level 1 (Yellow) Elbow Flexion: Strengthening;Both;20 reps;Supine;Theraband Theraband Level (Elbow Flexion): Level 1 (Yellow) Elbow Extension: Strengthening;Both;20 reps;Supine;Theraband Theraband Level (Elbow Extension): Level 1 (Yellow)   Shoulder Instructions       General Comments      Pertinent Vitals/ Pain       Pain Assessment: No/denies pain  Home Living                                          Prior Functioning/Environment              Frequency Min 3X/week     Progress Toward Goals  OT Goals(current goals can now be found in the care plan section)  Progress towards OT goals: Progressing toward goals  Acute Rehab OT Goals Patient Stated Goal: return home to 35 yo daughter  Plan Discharge plan remains appropriate    Co-evaluation                 End of Session Equipment Utilized During Treatment: Oxygen   Activity Tolerance Patient tolerated treatment well   Patient Left in bed;with call bell/phone within reach   Nurse Communication          Time: 4859-2763 OT Time Calculation (min): 24 min  Charges: OT General Charges $OT Visit: 1 Procedure OT Treatments $Therapeutic Exercise: 23-37 mins  [name] 06/26/2015, 12:13 PM [phone]

## 2015-06-26 NOTE — H&P (View-Only) (Signed)
Physical Medicine and Rehabilitation Admission H&P    Chief Complaint  Patient presents with  . Abdominal Pain  : HPI: Dominique [name] is a 34 y.o. right handed morbidly obese female admitted 06/04/2015 with noted history of asthma, chronic systolic congestive heart failure and recent recurrent colitis since January 2016. Patient lives with family independent prior to admission. Presented with severe abdominal pain with occasional vomiting and nausea. Placed on intravenous Cipro and Flagyl. Underwent sigmoidoscopy 06/05/2015 showing severe chronic inflammatory changes in the proximal sigmoid and descending colon. Biopsies diagnosed with Crohn disease maintained on Solu-Medrol as well as mesalamine. CT angiogram and imaging revealed free intraperitoneal air/perforated viscus likely perforated left colon from Crohn's colitis. Underwent exploratory laparotomy drainage of intra-abdominal abscess partial colon resection application of wound VAC 06/09/2015 per Dr. Grandville Silos followed by reexploration of abdomen with closure creation of colostomy 06/11/2015.. Follow-up wound care nurse for ostomy care. Dressing changes wet-to-dry every shift. Hospital course respiratory failure required intubation followed by critical care medicine. She was extubated 06/14/2015. Hospital course acute blood loss anemia 7.0 transfused with latest hemoglobin 7.8 and monitored. Acute renal insufficiency creatinine 2.64-2.84. Renal ultrasound showing no hydronephrosis with latest creatinine 1. I will 9. Bouts of SVT responding to intravenous Cardizem transitioned to by mouth Lopressor and followed by cardiology services. Echocardiogram 06/18/2015 with ejection fraction of 75% normal systolic function. Subcutaneous heparin for DVT prophylaxis. Diet slowly advanced from clear liquids to mechanical soft. Physical therapy evaluation completed 06/15/2015 with recommendations of physical medicine rehabilitation consult. Patient was admitted  for a comprehensive rehabilitation program  Review of Systems  Neurological: Positive for weakness.  All other systems reviewed and are negative.  Review of Systems  Constitutional: Negative for fever and chills.  HENT: Negative for hearing loss.  Respiratory: Positive for shortness of breath.  Cardiovascular: Negative for chest pain, palpitations and leg swelling.  Gastrointestinal: Positive for nausea, vomiting, abdominal pain and diarrhea.  Genitourinary: Negative for dysuria and hematuria.  Skin: Negative for rash.  Neurological: Negative for dizziness, seizures, loss of consciousness and headaches    Past Medical History  Diagnosis Date  . Asthma   . Sleep apnea   . Anemia   . Morbid obesity 03/22/2009    Qualifier: Diagnosis of  By: Ronnald Ramp CMA, [place]    . OSA (obstructive sleep apnea) 05/02/2014  . Thyromegaly 05/02/2014  . Seasonal allergies 06/13/2014  . Acute acalculous cholecystitis 11/16/2014  . Diverticulitis of colon 11/17/2014  . Hepatic steatosis 11/17/2014   Past Surgical History  Procedure Laterality Date  . C section 2011  2011  . Flexible sigmoidoscopy N/A 06/05/2015    Procedure: FLEXIBLE SIGMOIDOSCOPY;  Surgeon: Ronald Lobo, MD;  Location: [facility];  Service: Endoscopy;  Laterality: N/A;  . Laparotomy N/A 06/09/2015    Procedure: EXPLORATORY LAPAROTOMY, DRAINAGE OF INTRAABDOMINAL ABSCESS, PARTIAL COLON RESECTION,  APPLICATION OF WOUND VAC;  Surgeon: Georganna Skeans, MD;  Location: [place];  Service: General;  Laterality: N/A;  . Laparotomy N/A 06/11/2015    Procedure: RE-EXPLORATION OF ABDOMEN;  Surgeon: Georganna Skeans, MD;  Location: [place];  Service: General;  Laterality: N/A;  . Colostomy N/A 06/11/2015    Procedure:  CREATION OF COLOSTOMY;  Surgeon: Georganna Skeans, MD;  Location: [facility] OR;  Service: General;  Laterality: N/A;   Family History  Problem Relation Age of Onset  . Hypertension Mother   . Hypertension Father   . Hypertension Sister   .  Asthma Brother   . Diabetes Maternal Grandmother   .  Hypertension Maternal Grandmother   . Hypertension Maternal Grandfather   . Cancer Paternal Grandmother     lung  . Emphysema Paternal Grandfather   . Allergies Mother    Social History:  reports that she has quit smoking. Her smoking use included Cigarettes. She started smoking about 11 months ago. She has a 2 pack-year smoking history. She has never used smokeless tobacco. She reports that she does not drink alcohol or use illicit drugs. Allergies:  Allergies  Allergen Reactions  . Other Shortness Of Breath and Swelling    Tree nuts  . Penicillins     Unknown childhood allergy   Medications Prior to Admission  Medication Sig Dispense Refill  . ferrous sulfate 325 (65 FE) MG tablet Take 2 tablets (650 mg total) by mouth daily with breakfast. 60 tablet 3  . ondansetron (ZOFRAN) 4 MG tablet Take 1 tablet (4 mg total) by mouth every 6 (six) hours. 12 tablet 0  . [DISCONTINUED] ciprofloxacin (CIPRO) 500 MG tablet Take 1 tablet (500 mg total) by mouth 2 (two) times daily. One po bid x 7 days 14 tablet 0  . [DISCONTINUED] HYDROcodone-acetaminophen (NORCO/VICODIN) 5-325 MG per tablet TAKE 1 TABLET BY MOUTH EVERY 6 HOURS AS NEEDED FOR PAIN  0  . [DISCONTINUED] metroNIDAZOLE (FLAGYL) 500 MG tablet Take 1 tablet (500 mg total) by mouth 3 (three) times daily. One po tid x 7 days 21 tablet 0  . albuterol (PROVENTIL) (2.5 MG/3ML) 0.083% nebulizer solution Take 3 mLs (2.5 mg total) by nebulization every 6 (six) hours as needed for wheezing or shortness of breath. (Patient not taking: Reported on 06/03/2015) 360 mL 0  . PROAIR HFA 108 (90 BASE) MCG/ACT inhaler INHALE 2 PUFFS BY MOUTH INTO THE LUNGS EVERY 6 HOURS AS NEEDED FOR WHEEZING OR SHORTNESS OF BREATH 8.5 g 3  . [DISCONTINUED] beclomethasone (QVAR) 80 MCG/ACT inhaler Inhale 2 puffs into the lungs 2 (two) times daily. (Patient not taking: Reported on 06/03/2015) 1 Inhaler 12  . [DISCONTINUED]  cyclobenzaprine (FLEXERIL) 10 MG tablet Take 1 tablet (10 mg total) by mouth at bedtime. (Patient not taking: Reported on 05/25/2015) 30 tablet 1  . [DISCONTINUED] pantoprazole (PROTONIX) 40 MG tablet Take 1 tablet (40 mg total) by mouth daily. (Patient not taking: Reported on 05/20/2015) 30 tablet 1  . [DISCONTINUED] ranitidine (ZANTAC) 150 MG capsule Take 1 capsule (150 mg total) by mouth 2 (two) times daily. (Patient not taking: Reported on 05/20/2015) 60 capsule 0  . [DISCONTINUED] torsemide (DEMADEX) 20 MG tablet Take 1 tablet (20 mg total) by mouth daily. (Patient not taking: Reported on 06/03/2015) 30 tablet 3    Home: Home Living Family/patient expects to be discharged to:: Private residence Living Arrangements: Children, Parent (4 y.o. daughter) Available Help at Discharge: Family Type of Home: Apartment Home Access: Stairs to enter Entrance Stairs-Number of Steps: 8 Entrance Stairs-Rails: Right, Left, Can reach both Home Layout: One level Bathroom Shower/Tub: Tub/shower unit Bathroom Toilet: Standard Home Equipment: None   Functional History: Prior Function Level of Independence: Independent Comments: reports she was weak due to illness last several days at home, but did ascend 8 steps to parking lot and came to hospital via car  Functional Status:  Mobility: Bed Mobility Overal bed mobility: Needs Assistance Bed Mobility: Rolling Rolling: Max assist, +2 for physical assistance Sidelying to sit:  (+3 best) Supine to sit: +2 for physical assistance, Total assist (Pt does try to help, but due to her body habitus and the bed she   is in it is diffcult for her to help) Sit to supine: Total assist (+3 (one for her trunk and one for each leg)) General bed mobility comments: Rolling multiple times to get on/off the bed pan Transfers Overall transfer level: Needs assistance Equipment used: Rolling walker (2 wheeled) (bariatric) Transfer via Lift Equipment: Maximove Transfers: Sit  to/from Stand Sit to Stand: Total assist, +2 physical assistance General transfer comment: Used maximove to transfer back to bed for toileting.  pt used UE to assist minimally upper trunk during fastening and unfastening clips.      ADL: ADL Overall ADL's : Needs assistance/impaired Eating/Feeding: Set up, Sitting Eating/Feeding Details (indicate cue type and reason): sat at EOB, assist to set up tray as pt reliant on 1 hand on bed for sitting balance, increased spillage Grooming: Wash/dry hands, Wash/dry face, Bed level, Set up Upper Body Bathing: Maximal assistance, Bed level Lower Body Bathing: +2 for physical assistance, Total assistance, Bed level Upper Body Dressing : Moderate assistance, Bed level Upper Body Dressing Details (indicate cue type and reason): changed gown Lower Body Dressing: +2 for physical assistance, Total assistance, Bed level Toileting- Clothing Manipulation and Hygiene: +2 for physical assistance, Total assistance, Bed level Toileting - Clothing Manipulation Details (indicate cue type and reason): used bedpan General ADL Comments: Pt total A for pericare and colostomy care. Colstomy leaked while we were helping her to roll to place maxi move pad. Nursing and and we A'd her with getting pt cleaned up and new colostomy bag on.  Cognition: Cognition Overall Cognitive Status: Within Functional Limits for tasks assessed Orientation Level: Oriented X4 Cognition Arousal/Alertness: Awake/alert Behavior During Therapy: WFL for tasks assessed/performed Overall Cognitive Status: Within Functional Limits for tasks assessed  Physical Exam: Blood pressure 99/51, pulse 91, temperature 98.7 F (37.1 C), temperature source Oral, resp. rate 18, height 5' 2" (1.575 m), weight 167.009 kg (368 lb 3 oz), last menstrual period 05/06/2015, SpO2 100 %. Physical Exam  Constitutional: She appears well-developed and well-nourished.  Morbid obesity  HENT:  Head: Normocephalic and  atraumatic.  Eyes: Conjunctivae and EOM are normal.  Neck: Normal range of motion. Neck supple.  Cardiovascular: Normal rate, regular rhythm and normal heart sounds.   Respiratory: Effort normal and breath sounds normal. No respiratory distress.  GI: Soft. Bowel sounds are normal.  Incision with dressing +Colostomy  Musculoskeletal: She exhibits no tenderness.  PROM WNL B/l UE 4/5 LLE hip flexion 3/5, ankle dorsi/plantar flexion 4/5 RLE hip flexion 2+/5, ankle dorsi/plantar flexion 3/5  Neurological: She is alert. No cranial nerve deficit.  Skin: Skin is warm and dry.  Abdominal incision with dressing c/d/i  Psychiatric: Her behavior is normal.  Decreased motivation   Constitutional: She is oriented to person, place, and time.  34-year-old African-American morbidly obese female  HENT:  Head: Normocephalic.  Eyes: EOM are normal.  Neck: Normal range of motion. Neck supple. No thyromegaly present.  Cardiovascular: Normal rate and regular rhythm.  Respiratory: Effort normal and breath sounds normal. No respiratory distress.  GI: Soft.  New abdominal dressing applied. Ostomy site clean and dry  skin ischemic tip of right fifth digit.  Neurological: She is alert and oriented to person, place, and time   Results for orders placed or performed during the hospital encounter of 06/03/15 (from the past 48 hour(s))  Glucose, capillary     Status: Abnormal   Collection Time: 06/24/15  4:54 PM  Result Value Ref Range   Glucose-Capillary 133 (H) 65 - 99   mg/dL  CBC     Status: Abnormal   Collection Time: 06/24/15  8:10 PM  Result Value Ref Range   WBC 10.3 4.0 - 10.5 K/uL   RBC 3.86 (L) 3.87 - 5.11 MIL/uL   Hemoglobin 8.5 (L) 12.0 - 15.0 g/dL   HCT 28.7 (L) 36.0 - 46.0 %   MCV 74.4 (L) 78.0 - 100.0 fL   MCH 22.0 (L) 26.0 - 34.0 pg   MCHC 29.6 (L) 30.0 - 36.0 g/dL   RDW 27.4 (H) 11.5 - 15.5 %   Platelets 294 150 - 400 K/uL  Glucose, capillary     Status: Abnormal   Collection  Time: 06/24/15 10:06 PM  Result Value Ref Range   Glucose-Capillary 103 (H) 65 - 99 mg/dL   Comment 1 Notify RN    Comment 2 Document in Chart   Renal function panel     Status: Abnormal   Collection Time: 06/25/15  4:27 AM  Result Value Ref Range   Sodium 134 (L) 135 - 145 mmol/L   Potassium 3.5 3.5 - 5.1 mmol/L   Chloride 103 101 - 111 mmol/L   CO2 27 22 - 32 mmol/L   Glucose, Bld 95 65 - 99 mg/dL   BUN 27 (H) 6 - 20 mg/dL   Creatinine, Ser 1.20 (H) 0.44 - 1.00 mg/dL   Calcium 7.6 (L) 8.9 - 10.3 mg/dL   Phosphorus 3.1 2.5 - 4.6 mg/dL   Albumin 1.6 (L) 3.5 - 5.0 g/dL   GFR calc non Af Amer 58 (L) >60 mL/min   GFR calc Af Amer >60 >60 mL/min    Comment: (NOTE) The eGFR has been calculated using the CKD EPI equation. This calculation has not been validated in all clinical situations. eGFR's persistently <60 mL/min signify possible Chronic Kidney Disease.    Anion gap 4 (L) 5 - 15  Glucose, capillary     Status: None   Collection Time: 06/25/15  7:41 AM  Result Value Ref Range   Glucose-Capillary 97 65 - 99 mg/dL   Comment 1 Notify RN   Glucose, capillary     Status: Abnormal   Collection Time: 06/25/15 11:49 AM  Result Value Ref Range   Glucose-Capillary 105 (H) 65 - 99 mg/dL  Glucose, capillary     Status: None   Collection Time: 06/25/15  5:01 PM  Result Value Ref Range   Glucose-Capillary 93 65 - 99 mg/dL  Glucose, capillary     Status: None   Collection Time: 06/25/15  9:30 PM  Result Value Ref Range   Glucose-Capillary 78 65 - 99 mg/dL   Comment 1 Notify RN   Renal function panel     Status: Abnormal   Collection Time: 06/26/15  5:40 AM  Result Value Ref Range   Sodium 135 135 - 145 mmol/L   Potassium 3.4 (L) 3.5 - 5.1 mmol/L   Chloride 103 101 - 111 mmol/L   CO2 27 22 - 32 mmol/L   Glucose, Bld 87 65 - 99 mg/dL   BUN 23 (H) 6 - 20 mg/dL   Creatinine, Ser 1.09 (H) 0.44 - 1.00 mg/dL   Calcium 7.5 (L) 8.9 - 10.3 mg/dL   Phosphorus 3.1 2.5 - 4.6 mg/dL    Albumin 1.5 (L) 3.5 - 5.0 g/dL   GFR calc non Af Amer >60 >60 mL/min   GFR calc Af Amer >60 >60 mL/min    Comment: (NOTE) The eGFR has been calculated using the CKD EPI equation.  This calculation has not been validated in all clinical situations. eGFR's persistently <60 mL/min signify possible Chronic Kidney Disease.    Anion gap 5 5 - 15  Glucose, capillary     Status: None   Collection Time: 06/26/15  7:39 AM  Result Value Ref Range   Glucose-Capillary 91 65 - 99 mg/dL  Glucose, capillary     Status: None   Collection Time: 06/26/15 12:22 PM  Result Value Ref Range   Glucose-Capillary 90 65 - 99 mg/dL   No results found.     Medical Problem List and Plan: 1. Functional deficits secondary to debilitation/Crohn's disease/perforated left colon with peritonitis/exploratory laparotomy with drainage of intra-abdominal abscess partial colon resection with colostomy. ContinuePentasa 500 mg 4 times a day 2.  DVT Prophylaxis/Anticoagulation: Subcutaneous heparin. Monitor platelet counts of any signs of bleeding 3. Pain Management: Hydrocodone as needed 4. Acute blood loss anemia. Patient transfused follow-up CBC 5. Neuropsych: This patient is capable of making decisions on her own behalf. 6. Skin/Wound Care: Routine skin checks and dressing changes as directed with wet to dry twice a day 7. Fluids/Electrolytes/Nutrition: Strict I&O with follow-up chemistries 8. Acute renal insufficiency 2.64-2.84. Creatinine latest result 1.09. Follow-up chemistries 9. Acute respiratory failure/VDRF. Extubated 06/14/2015. 10. SVT. Monitor heart rate. Lopressor 50 mg twice a day 11. Chronic systolic congestive heart failure. Monitor for any signs of fluid overload. Lasix 40 mg twice a day 12. Constipation. Laxed assistance 13. Morbid obesity. Body mass index 67.33   Post Admission Physician Evaluation: 1. Functional deficits secondary  to debilitation/Crohn's disease/perforated left colon with  peritonitis/exploratory laparotomy with drainage of intra-abdominal abscess partial colon resection with colostomy. 2. Patient is admitted to receive collaborative, interdisciplinary care between the physiatrist, rehab nursing staff, and therapy team. 3. Patient's level of medical complexity and substantial therapy needs in context of that medical necessity cannot be provided at a lesser intensity of care such as a SNF. 4. Patient has experienced substantial functional loss from his/her baseline which was documented above under the "Functional History" and "Functional Status" headings.  Judging by the patient's diagnosis, physical exam, and functional history, the patient has potential for functional progress which will result in measurable gains while on inpatient rehab.  These gains will be of substantial and practical use upon discharge  in facilitating mobility and self-care at the household level. 5. Physiatrist will provide 24 hour management of medical needs as well as oversight of the therapy plan/treatment and provide guidance as appropriate regarding the interaction of the two. 6. 24 hour rehab nursing will assist with bowel management, skin/wound care, disease management, medication administration, pain management and patient education  and help integrate therapy concepts, techniques,education, etc. 7. PT will assess and treat for/with: Lower extremity strength, range of motion, stamina, balance, functional mobility, safety, adaptive techniques and equipment. Goals are: min assist. 8. OT will assess and treat for/with: ADL's, functional mobility, safety, upper extremity strength, adaptive techniques and equipment, NMR, family ed, stroke education. Goals are: supervision to min assist. Therapy may not yet proceed with showering this patient. 9. Case Management and Social Worker will assess and treat for psychological issues and discharge planning. 10. Team conference will be held weekly to  assess progress toward goals and to determine barriers to discharge. 11. Patient will receive at least 3 hours of therapy per day at least 5 days per week. 12. ELOS: 14-17 days. 13. Prognosis:  fair  Ankit Patel, MD  06/26/2015 

## 2015-06-26 NOTE — Discharge Summary (Signed)
Physician Discharge Summary  Dominique [name] UUE:[id number] DOB: [birth year] DOA: 06/03/2015  PCP: Benito Mccreedy, MD  Admit date: 06/03/2015 Discharge date: 06/26/2015  Time spent: 40 minutes  Recommendations for Outpatient Follow-up:  1. Follow-up with primary care physician within one week.  Discharge Diagnoses:  Principal Problem:   Colitis Active Problems:   Morbid obesity- BMI 67.5   Iron deficiency anemia   Pressure ulcer   Acute respiratory failure with hypoxia (HCC)   Crohn's disease ([place])   Perforated sigmoid colon (HCC)   Peritonitis ([place])   Peritoneal abscess (HCC)   OSA on CPAP   Obesity hypoventilation syndrome (HCC)   Acute renal insufficiency   Acute encephalopathy   Pulmonary hypertension (HCC)   Cardiomyopathy- etiology not yet determined   Acute renal failure syndrome ([place])   Discharge Condition: Stable  Diet recommendation: Heart healthy  Filed Weights   06/03/15 1807  Weight: 167.009 kg (368 lb 3 oz)    History of present illness:  Patient is a 34 year old female with history of asthma and 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 She said the pain started back in Jan and improved only between Feb and May. Since June she continued to have abdominal pain with diarrhea and occasional vomiting. She has not been able to see GI for insurance and referral issues until last week. She said after discussing her last CT abdomen findings with GI, the plan for her was to continue a course of abx and come back but last Friday she started having severe abdominal pain that got more diffuse ( usually she has lower abdominal pain) associated with vomiting daily for 2-3 times ( non bilious and non bloody) and daily diarrhea ( soft stool with occasional blood0. She denied fever or chills. No other complaints. No chest pain , cough or dyspnea.   Hospital Course:   Newly diagnosed Crohn's disease  -S/P 9/12 flexible sigmoidoscopy  w/ bx consistent with Crohn's disease -Pentasa 500 QID per GI recs  -Initially was on steroids, GI recommended to discontinue it and continue Pentasa.  Sepsis due to Perforated left colon w/ Peritonitis and Peritoneal abscess -9/16 patient diagnosed with perforated left colon from Crohn's colitis -Emergent exploratory laparoscopy 9/16 w/ partial colon resection and drainage of intra-abdominal abscess -Returned to OR 9/18 for reexploration of abdomen with closure, and mobilization of the splenic flexure with creation of colostomy -Patient received Cipro/Flagyl and finished antibiotics on 9/23. She did not have fever since then. -Gen. surgery was following, there was concern about mucocutaneous separation of the ostomy but no retraction.  Persistent Sinus tachycardia  -no evidence of PE on angiogram 9/16 - TSH normal. -This is likely to CHF, is on 25 mg of metoprolol will increase to 50. On discharge switched to Toprol-XL 50 mg  Severe systolic CHF - ?newly diagnosed  -EF 20% per TTE 06/18/15 w/ diffuse hypokinesis - felt to be stress/critical illness CM and likely to improve -Cardiology following. -On Toprol-XL 50 mg and Lasix 40 mg twice a day, blood pressure soft. Could not ACE inhibitor, Imdur or hydralazine.  Asthma -quiescent   OSA/OHS -CPAP/BIPAP QHS per respiratory, patient uses BiPAP daily at bedtime at home.  Acute respiratory failure with hypoxia/VDRF -Resolved  Anemia of critical illness  -hemoglobin stable - no evidence of blood loss   Acute renal failure due to septic shock/ATN -Serum creatinine reached a peak of 2.8, improving. -This is resolved, creatinine is 1.0 today. Discharge  on 40 mg of Lasix twice a day.  Acute encephalopathy -resolved  Severe malnutrition in context of acute illness/injury  Super Morbid Obesity - Body mass index is 67.33 kg/(m^2).   Ischemic tip of the right 5th digit -Felt to be due to sepsis/acute systolic CHF, continue to  watch.   Procedures:  None  Consultations:  Gen. surgery.  Cardiology.  GI.  Discharge Exam: Filed Vitals:   06/26/15 0547  BP: 99/51  Pulse: 91  Temp: 98.7 F (37.1 C)  Resp: 18   General: Alert and awake, oriented x3, not in any acute distress. HEENT: anicteric sclera, pupils reactive to light and accommodation, EOMI CVS: S1-S2 clear, no murmur rubs or gallops Chest: clear to auscultation bilaterally, no wheezing, rales or rhonchi Abdomen: soft nontender, nondistended, normal bowel sounds, no organomegaly Extremities: no cyanosis, clubbing or edema noted bilaterally Neuro: Cranial nerves II-XII intact, no focal neurological deficits  Discharge Instructions   Discharge Instructions    Diet - low sodium heart healthy    Complete by:  As directed      Increase activity slowly    Complete by:  As directed           Current Discharge Medication List    START taking these medications   Details  furosemide (LASIX) 40 MG tablet Take 1 tablet (40 mg total) by mouth 2 (two) times daily.    mesalamine (PENTASA) 250 MG CR capsule Take 2 capsules (500 mg total) by mouth 4 (four) times daily.    metoprolol succinate (TOPROL XL) 50 MG 24 hr tablet Take 1 tablet (50 mg total) by mouth daily. Take with or immediately following a meal.    polyethylene glycol (MIRALAX / GLYCOLAX) packet Take 17 g by mouth daily. Qty: 14 each, Refills: 0    saccharomyces boulardii (FLORASTOR) 250 MG capsule Take 1 capsule (250 mg total) by mouth 2 (two) times daily.      CONTINUE these medications which have NOT CHANGED   Details  ferrous sulfate 325 (65 FE) MG tablet Take 2 tablets (650 mg total) by mouth daily with breakfast. Qty: 60 tablet, Refills: 3    ondansetron (ZOFRAN) 4 MG tablet Take 1 tablet (4 mg total) by mouth every 6 (six) hours. Qty: 12 tablet, Refills: 0    albuterol (PROVENTIL) (2.5 MG/3ML) 0.083% nebulizer solution Take 3 mLs (2.5 mg total) by nebulization every 6  (six) hours as needed for wheezing or shortness of breath. Qty: 360 mL, Refills: 0    PROAIR HFA 108 (90 BASE) MCG/ACT inhaler INHALE 2 PUFFS BY MOUTH INTO THE LUNGS EVERY 6 HOURS AS NEEDED FOR WHEEZING OR SHORTNESS OF BREATH Qty: 8.5 g, Refills: 3       Allergies  Allergen Reactions  . Other Shortness Of Breath and Swelling    Tree nuts  . Penicillins     Unknown childhood allergy   Follow-up Information    Follow up with OSEI-BONSU,GEORGE, MD In 1 week.   Specialty:  Internal Medicine   Contact information:   [place] [address] [phone]        The results of significant diagnostics from this hospitalization (including imaging, microbiology, ancillary and laboratory) are listed below for reference.    Significant Diagnostic Studies: Ct Abdomen Pelvis Wo Contrast  06/23/2015   CLINICAL DATA:  Wound infection. History of perforated left colon with peritonitis and peritoneal abscess. Crohn's disease . Laparotomy 06/11/2015 and 06/09/2015  EXAM: CT ABDOMEN AND PELVIS  WITHOUT CONTRAST  TECHNIQUE: Multidetector CT imaging of the abdomen and pelvis was performed following the standard protocol without IV contrast.  COMPARISON:  CT 06/04/2015  FINDINGS: Lower chest: New mild bibasilar atelectasis without pleural effusion.  Hepatobiliary: Limited evaluation of the liver due to her large patient size and lack of intravenous contrast. No liver lesion. Gallbladder bile ducts intact.  Pancreas: Mild pancreatic enlargement compared to the prior study with mild edema below the uncinate process. Possible pancreatitis.  Spleen: Negative  Adrenals/Urinary Tract: Negative for renal obstruction or stone.  Stomach/Bowel: Negative for bowel obstruction. No significant ileus. Left colostomy. Question mild edema in the right and transverse colon, not present previously. This could be due to colitis or possibly lack of adequate distention.  No intra-abdominal abscess.   Vascular/Lymphatic: Negative  Reproductive: Normal uterus.  No adnexal mass.  Other: Small amount of free fluid in the left pelvis.  No abscess.  Open wound in the midline down to the fascia. No abscess in the soft tissues.  Musculoskeletal: Negative  IMPRESSION: Postop left colectomy with left colostomy. Mild thickening of the right and transverse colon may reflect colitis. These areas did not show thickening on the prior study and therefore not likely to represent Crohn's colitis.  Small amount of free fluid in the left pelvis.  Negative for abscess  Open wound in the anterior abdominal wall.  Question mild pancreatitis with mild pancreatic edema. Limited evaluation due to lack of IV contrast large patient size.  Mild atelectasis in the lung bases.   Electronically Signed   By: Franchot Gallo M.D.   On: 06/23/2015 19:53   Ct Angio Chest Pe W/cm &/or Wo Cm  06/09/2015   CLINICAL DATA:  Shortness of breath, abdominal pain, Crohn's disease  EXAM: CT ANGIOGRAPHY CHEST WITH CONTRAST  TECHNIQUE: Multidetector CT imaging of the chest was performed using the standard protocol during bolus administration of intravenous contrast. Multiplanar CT image reconstructions and MIPs were obtained to evaluate the vascular anatomy.  CONTRAST:  120m OMNIPAQUE IOHEXOL 350 MG/ML SOLN  COMPARISON:  None.  FINDINGS: The study is markedly limited by extensive streak artifacts from patient's large body habitus. No gross central pulmonary embolus is noted. There is fatty infiltration of the liver. No aortic aneurysm. Evaluation of lobar and segmental arterial branches is nondiagnostic for pulmonary embolus.  The visualized upper abdomen shows free air anterior abdomen please see axial image 59 highly suspicious for perforated viscus. Clinical correlation is necessary. Further correlation with CT scan of the abdomen and pelvis is recommended.  There is nodular enlargement of right lobe of thyroid gland measures 4.2 cm. Further correlation  with thyroid gland ultrasound is recommended to exclude a mass. There is no mediastinal hematoma or adenopathy. No hilar adenopathy.  Images of the lung parenchyma shows no acute infiltrate or pulmonary edema.  Review of the MIP images confirms the above findings.  IMPRESSION: 1. Significant limited study by patient's large body habitus. No central pulmonary embolus is noted. 2. No acute infiltrate or pulmonary edema. No mediastinal hematoma or adenopathy. 3. There is free abdominal air in upper abdomen highly suspicious for perforated viscus. Clinical correlation is necessary. 4. There is 4.2 cm nodule lesion in right lobe of thyroid gland. Further evaluation with thyroid gland ultrasound is recommended. Critical Value/emergent results were called by telephone at the time of interpretation on 06/09/2015 at 6:02 pm to Dr. JNita Sells, who verbally acknowledged these results.   Electronically Signed   By: LJulien Girt  Pop M.D.   On: 06/09/2015 18:02   Ct Abdomen Pelvis W Contrast  06/04/2015   CLINICAL DATA:  Nausea, vomiting, diarrhea, and fever for 3 weeks. Diffuse abdominal pain more in the right lower quadrant.  EXAM: CT ABDOMEN AND PELVIS WITH CONTRAST  TECHNIQUE: Multidetector CT imaging of the abdomen and pelvis was performed using the standard protocol following bolus administration of intravenous contrast.  CONTRAST:  141m OMNIPAQUE IOHEXOL 300 MG/ML  SOLN  [place]  [place].  Diffuse fatty infiltration of the liver. Gallbladder, spleen, pancreas, adrenal glands, kidneys, abdominal aorta, inferior vena cava, and retroperitoneal lymph nodes are unremarkable. Stomach and small bowel are decompressed. Contrast material is present in the colon suggesting no evidence of small bowel obstruction. There is diffuse colonic wall thickening and diffuse infiltration in the pericolonic fat extending from the splenic flexure along the descending colon  and to the sigmoid colon. This is consistent with colitis and could represent inflammatory or infectious etiologies. The amount of pericolonic infiltration is increasing since the previous study, vertically around the sigmoid region. No free air or free fluid in the abdomen. Abdominal wall musculature appears intact.  Pelvis: Bladder wall is not thickened. Uterus and ovaries are not enlarged. Appendix is normal. No pelvic mass or lymphadenopathy. No destructive bone lesions.  IMPRESSION: Wall thickening and prominent pericolonic infiltration around the descending and sigmoid colon consistent with regional colitis. Inflammatory reaction is increasing since previous study. No discrete abscess identified.   Electronically Signed   By: WLucienne CapersM.D.   On: 06/04/2015 04:35   [place]  06/15/2015   CLINICAL DATA:  Renal failure.  EXAM: RENAL / URINARY TRACT ULTRASOUND COMPLETE  COMPARISON:  CT abdomen and pelvis 06/04/2015  FINDINGS: Right Kidney:  Length: 10.4 cm. Echogenicity within normal limits. No mass or hydronephrosis visualized.  Left Kidney:  Length: 12.9 cm. Limited visualization due to overlying bowel gas and rib shadowing. No evidence of hydronephrosis.  Bladder:  Bladder could not be examined due to bandages and open wound.  IMPRESSION: Limited visualization. No evidence of hydronephrosis in the kidneys. Bladder was not evaluated.   Electronically Signed   By: WLucienne CapersM.D.   On: 06/15/2015 02:19   Dg Chest Port 1 View  06/14/2015   CLINICAL DATA:  Ventilator dependent respiratory failure  EXAM: PORTABLE CHEST - 1 VIEW  COMPARISON:  Portable chest x-ray of 06/13/2015  FINDINGS: The lungs are slightly better aerated. The tip of the endotracheal tube is in now approximately 3.4 cm above the carina. Right IJ central venous line tip overlies the expected SVC -RA junction. No pneumothorax is seen. Cardiomegaly is stable. NG tube extends below the hemidiaphragm.  IMPRESSION: 1. Slightly better  aeration. 2. Tip of endotracheal tube 3.4 cm above the carina.   Electronically Signed   By: PIvar DrapeM.D.   On: 06/14/2015 08:12   Dg Chest Port 1 View  06/13/2015   CLINICAL DATA:  Respiratory failure.  EXAM: PORTABLE CHEST - 1 VIEW  COMPARISON:  None.  FINDINGS: Endotracheal tube, NG tube, right IJ line in stable position. Cardiomegaly with normal pulmonary vascularity. Low lung volumes. No pleural effusion or pneumothorax.  IMPRESSION: 1. Lines and tubes in stable position. 2. Stable cardiomegaly. 3. Stable low lung volumes.  No focal alveolar infiltrate.   Electronically Signed   By: TMarcello Moores Register   On: 06/13/2015 07:50   Dg Chest Port 1 View  06/12/2015  CLINICAL DATA:  Respiratory failure.  EXAM: PORTABLE CHEST - 1 VIEW  COMPARISON:  06/09/2015.  FINDINGS: Patient is rotated to the left. Endotracheal tube and NG tube in stable position. Mediastinum hilar structures are normal. Cardiomegaly with normal pulmonary vascularity. Low lung volumes. No prominent pleural effusion. No pneumothorax.  IMPRESSION: 1. Lines and tubes in stable position. 2. Stable cardiomegaly. 3. Low lung volumes.   Electronically Signed   By: Marcello Moores  Register   On: 06/12/2015 07:32   Dg Chest Port 1 View  06/09/2015   CLINICAL DATA:  Central line placement  EXAM: PORTABLE CHEST - 1 VIEW  COMPARISON:  08/22/2014  FINDINGS: Cardiomediastinal silhouette is stable. Endotracheal tube in place with tip 4.8 cm above the carina. NG tube in place. There is right IJ central line with tip in SVC right atrium junction. No pulmonary edema. No segmental infiltrate. No pneumothorax.  IMPRESSION: Endotracheal and NG tube in place. Right IJ central line with tip in SVC right atrium junction. No pneumothorax.   Electronically Signed   By: Lahoma Crocker M.D.   On: 06/09/2015 23:04    Microbiology: Recent Results (from the past 240 hour(s))  Culture, Urine     Status: None   Collection Time: 06/18/15  3:30 AM  Result Value Ref Range  Status   Specimen Description URINE, CATHETERIZED  Final   Special Requests NONE  Final   Culture >=100,000 COLONIES/mL LACTOBACILLUS SPECIES  Final   Report Status 06/19/2015 FINAL  Final     Labs: Basic Metabolic Panel:  Recent Labs Lab 06/20/15 0451 06/20/15 0948 06/22/15 0358 06/24/15 0502 06/25/15 0427 06/26/15 0540  NA 134*  --  137 136 134* 135  K 5.0  --  4.4 3.6 3.5 3.4*  CL 107  --  108 105 103 103  CO2 21*  --  24 25 27 27   GLUCOSE 112*  --  92 90 95 87  BUN 44*  --  39* 29* 27* 23*  CREATININE 1.83*  --  1.53* 1.31* 1.20* 1.09*  CALCIUM 8.0*  --  8.2* 7.9* 7.6* 7.5*  MG  --  2.1  --   --   --   --   PHOS  --   --   --   --  3.1 3.1   Liver Function Tests:  Recent Labs Lab 06/20/15 0451 06/25/15 0427 06/26/15 0540  AST 17  --   --   ALT 16  --   --   ALKPHOS 81  --   --   BILITOT 0.8  --   --   PROT 5.6*  --   --   ALBUMIN 1.7* 1.6* 1.5*   No results for input(s): LIPASE, AMYLASE in the last 168 hours. No results for input(s): AMMONIA in the last 168 hours. CBC:  Recent Labs Lab 06/20/15 0451 06/22/15 0358 06/23/15 0828 06/24/15 0502 06/24/15 2010  WBC 20.2* 13.6* 12.7* 12.1* 10.3  NEUTROABS  --   --  11.0*  --   --   HGB 7.8* 7.6* 8.5* 8.4* 8.5*  HCT 26.3* 26.7* 28.9* 28.0* 28.7*  MCV 72.1* 73.6* 74.7* 74.7* 74.4*  PLT 396 338 281 299 294   Cardiac Enzymes: No results for input(s): CKTOTAL, CKMB, CKMBINDEX, TROPONINI in the last 168 hours. BNP: BNP (last 3 results) No results for input(s): BNP in the last 8760 hours.  ProBNP (last 3 results) No results for input(s): PROBNP in the last 8760 hours.  CBG:  Recent Labs Lab  06/25/15 1149 06/25/15 1701 06/25/15 2130 06/26/15 0739 06/26/15 1222  GLUCAP 105* 93 78 91 90       Signed:  Lamisha Roussell A  [facility] 06/26/2015, 2:04 PM

## 2015-06-26 NOTE — Consult Note (Addendum)
WOC follow-up: CCS team following for assessment and plan of care to abd wound.  Vac was discontinued on Fri R/T pus and bedside nurses have orders to pack wound with moist gauze.  CCS team aslo removed ostomy pouch to assess mucocutaneous separation to stoma this AM; refer to their progress notes.  Small amt semi-formed brown stool in pouch and mod amt red-tinged drainage.  Pouch is currently intact with good seal.  Supplies and instructions at bedside for staff nurse use if pouch leakage occurs.  Julien Girt MSN, RN, [place], [place], [place]

## 2015-06-26 NOTE — Progress Notes (Signed)
Patient transferred to 4MW 10.  All belongings packed up and sent with patient. Patient's mother was notified of transfer and patient's new location.

## 2015-06-26 NOTE — Progress Notes (Signed)
Dominique Diones, RN Rehab Admission Coordinator Signed Physical Medicine and Rehabilitation PMR Pre-admission 06/26/2015 2:15 PM  Related encounter: ED to Hosp-Admission (Current) from 06/03/2015 in [facility]   PMR Admission Coordinator Pre-Admission Assessment  Patient: Dominique [name] is an 34 y.o., female MRN: [id number] DOB: May 11, 1981 Height: 5' 2"  (157.5 cm) Weight:  (unable to obtain due to scale on bed not working. 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 Date: Eligible 06/19/15 Deduct: Out of Pocket Max: Life Max:  CIR: SNF:  Outpatient: Co-Pay:  Home Health: Co-Pay:  DME: Co-Pay:  Providers:   Medicaid Application Date: Case Manager:  Disability Application Date: Case Worker:   Emergency Contact Information Contact Information    Name Relation Home Work Conrad Other [phone]     Jones,Patricia Mother [phone]     Xian, Apostol [phone]     Aianna, Fahs [phone]       Current Medical History  Patient Admitting Diagnosis: Debility after ex lap, ventilator dependent respiratory failure   History of Present Illness: A 34 y.o. right handed morbidly obese female admitted 06/04/2015 with noted history of asthma, chronic systolic congestive heart failure and recent recurrent colitis since January 2016. Patient lives with family independent prior to admission. Presented  with severe abdominal pain with occasional vomiting and nausea. Placed on intravenous Cipro and Flagyl. Underwent sigmoidoscopy 06/05/2015 showing severe chronic inflammatory changes in the proximal sigmoid and descending colon. Biopsies diagnosed with Crohn disease maintained on Solu-Medrol as well as mesalamine. CT angiogram and imaging revealed free intraperitoneal air/perforated viscus likely perforated left colon from Crohn's colitis. Underwent exploratory laparotomy drainage of intra-abdominal abscess partial colon resection application of wound VAC 06/09/2015 per Dr. Grandville Silos followed by reexploration of abdomen with closure creation of colostomy 06/11/2015.. Follow-up wound care nurse for ostomy care. Dressing changes wet-to-dry every shift. Hospital course respiratory failure required intubation followed by critical care medicine. She was extubated 06/14/2015. Hospital course acute blood loss anemia 7.0 transfused with latest hemoglobin 7.8 and monitored. Acute renal insufficiency creatinine 2.64-2.84. Renal ultrasound showing no hydronephrosis with latest creatinine 1. I will 9. Bouts of SVT responding to intravenous Cardizem transitioned to by mouth Lopressor and followed by cardiology services. Echocardiogram 06/18/2015 with ejection fraction of 69% normal systolic function. Subcutaneous heparin for DVT prophylaxis. Diet slowly advanced from clear liquids to mechanical soft. Physical therapy evaluation completed 06/15/2015 with recommendations of physical medicine rehabilitation consult. Patient to be admitted for a comprehensive inpatient rehabilitation program.   Past Medical History  Past Medical History  Diagnosis Date  . Asthma   . Sleep apnea   . Anemia   . Morbid obesity 03/22/2009    Qualifier: Diagnosis of By: Ronnald Ramp CMA, Chemira   . OSA (obstructive sleep apnea) 05/02/2014  . Thyromegaly 05/02/2014  . Seasonal allergies 06/13/2014  . Acute acalculous  cholecystitis 11/16/2014  . Diverticulitis of colon 11/17/2014  . Hepatic steatosis 11/17/2014    Family History  family history includes Allergies in her mother; Asthma in her brother; Cancer in her paternal grandmother; Diabetes in her maternal grandmother; Emphysema in her paternal grandfather; Hypertension in her father, maternal grandfather, maternal grandmother, mother, and sister.  Prior Rehab/Hospitalizations: No previous rehab admissions.  Has the patient had major surgery during 100 days  prior to admission? No  Current Medications   Current facility-administered medications:  . dextrose 50 % solution 12.5 g, 12.5 g, Intravenous, PRN, Collene Gobble, MD, 12.5 g at 06/10/15 1151 . feeding supplement (ENSURE ENLIVE) (ENSURE ENLIVE) liquid 237 mL, 237 mL, Oral, TID BM, Ardeen Garland, RD, Stopped at 06/26/15 1329 . fentaNYL (SUBLIMAZE) injection 50-100 mcg, 50-100 mcg, Intravenous, Q2H PRN, Chesley Mires, MD, 100 mcg at 06/26/15 1002 . furosemide (LASIX) tablet 40 mg, 40 mg, Oral, BID, Allie Bossier, MD, 40 mg at 06/26/15 1056 . guaiFENesin (MUCINEX) 12 hr tablet 1,200 mg, 1,200 mg, Oral, BID PRN, Nat Christen, PA-C . heparin injection 5,000 Units, 5,000 Units, Subcutaneous, 3 times per day, Chesley Mires, MD, 5,000 Units at 06/26/15 1328 . HYDROcodone-acetaminophen (NORCO) 7.5-325 MG per tablet 2 tablet, 2 tablet, Oral, Q6H PRN, Fanny Skates, MD, 2 tablet at 06/26/15 1328 . Influenza vac split quadrivalent PF (FLUARIX) injection 0.5 mL, 0.5 mL, Intramuscular, Tomorrow-1000, Maryann Mikhail, DO, 0.5 mL at 06/07/15 1400 . insulin aspart (novoLOG) injection 0-15 Units, 0-15 Units, Subcutaneous, TID WC, Judeth Horn, MD, 0 Units at 06/25/15 0800 . insulin aspart (novoLOG) injection 0-5 Units, 0-5 Units, Subcutaneous, QHS, Judeth Horn, MD, 0 Units at 06/24/15 2200 . mesalamine (PENTASA) CR capsule 500 mg, 500 mg, Oral, QID, Cherene Altes, MD, 500 mg at 06/26/15  1328 . metoCLOPramide (REGLAN) tablet 5 mg, 5 mg, Oral, TID AC, Megan N Baird, PA-C, 5 mg at 06/26/15 1328 . metoprolol (LOPRESSOR) tablet 50 mg, 50 mg, Oral, BID, Verlee Monte, MD, 50 mg at 06/26/15 1056 . ondansetron (ZOFRAN) injection 4 mg, 4 mg, Intravenous, Q6H PRN, Chesley Mires, MD, 4 mg at 06/20/15 2239 . pantoprazole (PROTONIX) EC tablet 40 mg, 40 mg, Oral, Daily, Eudelia Bunch, RPH, 40 mg at 06/26/15 1056 . pneumococcal 13-valent conjugate vaccine (PREVNAR 13) injection 0.5 mL, 0.5 mL, Intramuscular, Tomorrow-1000, Maryann Mikhail, DO, 0.5 mL at 06/07/15 1736 . polyethylene glycol (MIRALAX / GLYCOLAX) packet 17 g, 17 g, Oral, Daily, Fanny Skates, MD, 17 g at 06/26/15 1057 . saccharomyces boulardii (FLORASTOR) capsule 250 mg, 250 mg, Oral, BID, Cherene Altes, MD, 250 mg at 06/26/15 1059 . sodium chloride 0.9 % injection 10-40 mL, 10-40 mL, Intracatheter, Q12H, Cherene Altes, MD, 20 mL at 06/26/15 1058 . sodium chloride 0.9 % injection 10-40 mL, 10-40 mL, Intracatheter, PRN, Cherene Altes, MD, 10 mL at 06/24/15 1801  Patients Current Diet: DIET SOFT Room service appropriate?: Yes; Fluid consistency:: Thin Diet - low sodium heart healthy  Precautions / Restrictions Precautions Precautions: Fall Precaution Comments: abdominal wound, colostomy Other Brace/Splint: abd binder when up Restrictions Weight Bearing Restrictions: No   Has the patient had 2 or more falls or a fall with injury in the past year?No. Had one fall at [facility] 02/16 with no injury.  Prior Activity Level Community (5-7x/wk): Went out 3-4 X a week. Did drive.  Home Assistive Devices / Equipment Home Assistive Devices/Equipment: None Home Equipment: None  Prior Device Use: Indicate devices/aids used by the patient prior to current illness, exacerbation or injury? None  Prior Functional Level Prior Function Level of Independence: Independent Comments: reports she was weak due to illness last  several days at home, but did ascend 8 steps to parking lot and came to hospital via car  Self Care: Did the patient need help bathing, dressing, using the toilet or eating? Independent  Indoor Mobility: Did the patient need assistance with walking from room to room (  with or without device)? Independent  Stairs: Did the patient need assistance with internal or external stairs (with or without device)? Independent  Functional Cognition: Did the patient need help planning regular tasks such as shopping or remembering to take medications? Independent  Current Functional Level Cognition  Overall Cognitive Status: Within Functional Limits for tasks assessed Orientation Level: Oriented X4   Extremity Assessment (includes Sensation/Coordination)  Upper Extremity Assessment: RUE deficits/detail, LUE deficits/detail RUE Deficits / Details: 3/5 shoulder, 4-/5 elbow to hand RUE Coordination: decreased gross motor LUE Deficits / Details: 2+/5 shoulder, 3+/5 elbow, 4-/5 grip LUE Coordination: decreased gross motor  Lower Extremity Assessment: Defer to PT evaluation RLE Deficits / Details: limited knee flexion to ~90 with AAROM; when asked to bend her knees up to roll, she bends knees in "frog position" with full external rotation and requires max assist to internally rotate to roll; unable to stand with +2 assist LLE Deficits / Details: limited knee flexion to ~90 with AAROM; when asked to bend her knees up to roll, she bends knees in "frog position" with full external rotation and requires max assist to internally rotate to roll; unable to stand with +2 assist    ADLs  Overall ADL's : Needs assistance/impaired Eating/Feeding: Set up, Sitting Eating/Feeding Details (indicate cue type and reason): sat at EOB, assist to set up tray as pt reliant on 1 hand on bed for sitting balance, increased spillage Grooming: Wash/dry hands, Wash/dry face, Bed level, Set up Upper Body Bathing: Maximal  assistance, Bed level Lower Body Bathing: +2 for physical assistance, Total assistance, Bed level Upper Body Dressing : Moderate assistance, Bed level Upper Body Dressing Details (indicate cue type and reason): changed gown Lower Body Dressing: +2 for physical assistance, Total assistance, Bed level Toileting- Clothing Manipulation and Hygiene: +2 for physical assistance, Total assistance, Bed level Toileting - Clothing Manipulation Details (indicate cue type and reason): used bedpan General ADL Comments: Pt total A for pericare and colostomy care. Colstomy leaked while we were helping her to roll to place maxi move pad. Nursing and and we A'd her with getting pt cleaned up and new colostomy bag on.    Mobility  Overal bed mobility: Needs Assistance Bed Mobility: Rolling Rolling: Max assist, +2 for physical assistance Sidelying to sit: (+3 best) Supine to sit: +2 for physical assistance, Total assist (Pt does try to help, but due to her body habitus and the bed she is in it is diffcult for her to help) Sit to supine: Total assist (+3 (one for her trunk and one for each leg)) General bed mobility comments: Rolling multiple times to get on/off the bed pan    Transfers  Overall transfer level: Needs assistance Equipment used: Rolling walker (2 wheeled) (bariatric) Transfer via Lift Equipment: Maximove Transfers: Sit to/from Stand Sit to Stand: Total assist, +2 physical assistance General transfer comment: Used maximove to transfer back to bed for toileting. pt used UE to assist minimally upper trunk during fastening and unfastening clips.    Ambulation / Gait / Stairs / Office manager / Balance Dynamic Sitting Balance Sitting balance - Comments: sat x 20 minutes Balance Overall balance assessment: Needs assistance Sitting-balance support: Feet supported, Bilateral upper extremity supported Sitting balance-Leahy Scale: Fair Sitting balance - Comments:  sat x 20 minutes Standing balance comment: unable to stand after 2 trials, may have to try tilt table.    Special needs/care consideration BiPAP/CPAP Has Bipap at home but does  not always use it CPM No Continuous Drip IV No Dialysis No  Life Vest No Oxygen Yes, more at night while in the hospital. Currently on an 02 sat monitor. Special Bed Yes on a Mighty Air low air loss bed Trach Size No Wound Vac (area) Yes, but was discontinued on 06/23/15 due to pus Skin Abdominal wound. Has 2 areas of skin breakdown under her breasts.   Bowel mgmt: Ostomy pouch Bladder mgmt: Voiding on bedpan Diabetic mgmt No    Previous Home Environment Living Arrangements: Children, Parent (50 y.o. daughter) Available Help at Discharge: Family Type of Home: Apartment Home Layout: One level Home Access: Stairs to enter Entrance Stairs-Rails: Right, Left, Can reach both Entrance Stairs-Number of Steps: 8 Bathroom Shower/Tub: Chiropodist: Standard Home Care Services: No  Discharge Living Setting Plans for Discharge Living Setting: Lives with (comment), Apartment (Lives with mom and 83 yo daughter.) Type of Home at Discharge: Apartment Discharge Home Layout: One level Discharge Home Access: Stairs to enter Entrance Stairs-Number of Steps: 9 steps entry Does the patient have any problems obtaining your medications?: No  Social/Family/Support Systems Patient Roles: Parent (Has a 68 yo daughter, mom and a sister.) Contact Information: Roselee Culver - mom [phone] Anticipated Caregiver: mom Ability/Limitations of Caregiver: Mom is living with patient and does not work. Mom is caring for 74 yo grandchild. Sister comes by daily. Caregiver Availability: 24/7 Discharge Plan Discussed with Primary Caregiver: Yes Is Caregiver In Agreement with Plan?: Yes Does Caregiver/Family have Issues with Lodging/Transportation while Pt is in Rehab?:  No  Goals/Additional Needs Patient/Family Goal for Rehab: PT mod I, OT mod I/supervision goals Expected length of stay: 10-15 days Cultural Considerations: None Dietary Needs: Soft diet, thin liquids Equipment Needs: TBD Pt/Family Agrees to Admission and willing to participate: Yes Program Orientation Provided & Reviewed with Pt/Caregiver Including Roles & Responsibilities: Yes  Decrease burden of Care through IP rehab admission: N/A  Possible need for SNF placement upon discharge: Not planned  Patient Condition: This patient's medical and functional status has changed since the consult dated: 06/16/15 in which the Rehabilitation Physician determined and documented that the patient's condition is appropriate for intensive rehabilitative care in an inpatient rehabilitation facility. See "History of Present Illness" (above) for medical update. Functional changes are: Currently requiring Hoyer lift for transfers, sat up in chair for 2 hrs 06/25/15. Patient's medical and functional status update has been discussed with the Rehabilitation physician and patient remains appropriate for inpatient rehabilitation. Will admit to inpatient rehab today.  Preadmission Screen Completed By: Dominique [name], 06/26/2015 2:31 PM ______________________________________________________________________  Discussed status with Dr. Posey Pronto on 06/26/15 at 1431 and received telephone approval for admission today.  Admission Coordinator: Dominique [name], time1431/Date10/03/16          Cosigned by: Ankit Lorie Phenix, MD at 06/26/2015 2:53 PM  Revision History     Date/Time User Provider Type Action   06/26/2015 2:53 PM Ankit Lorie Phenix, MD Physician Cosign   06/26/2015 2:33 PM Dominique Diones, RN Rehab Admission Coordinator Sign

## 2015-06-26 NOTE — Telephone Encounter (Signed)
TCM pt to be discharged from the hospital today 10/3. Will leave on triage desktop to follow-up with the pt as a TCM call tomorrow 10/4.

## 2015-06-26 NOTE — PMR Pre-admission (Signed)
PMR Admission Coordinator Pre-Admission Assessment  Patient: Dominique [name] is an 34 y.o., female MRN: [id number] DOB: 24-Mar-1981 Height: 5' 2"  (157.5 cm) Weight:  (unable to obtain due to scale on bed not working. )              Forensic scientist HMO:     PPO:       PCP:       IPA:       80/20:       OTHER:   PRIMARY:  Medicaid [place] access      Policy#: [id number]      Subscriber: [name] CM Name:        Phone#:       Fax#:   Pre-Cert#:        Employer: Not employed Benefits:  Phone #: [phone]      Name:   Eff. Date: Eligible 06/19/15     Deduct:        Out of Pocket Max:        Life Max:   CIR:        SNF:   Outpatient:       Co-Pay:   Home Health:        Co-Pay:   DME:       Co-Pay:   Providers:   Medicaid Application Date:        Case Manager:   Disability Application Date:        Case Worker:    Emergency Contact Information Contact Information    Name Relation Home Work [employer] Other [phone]     Jones,Patricia Mother [phone]     Jolonda, Gomm [phone]     Wave, Calzada [phone]       Current Medical History  Patient Admitting Diagnosis: Debility after ex lap, ventilator dependent respiratory failure   History of Present Illness: A 34 y.o. right handed morbidly obese female admitted 06/04/2015 with noted history of asthma, chronic systolic congestive heart failure and recent recurrent colitis since January 2016. Patient lives with family independent prior to admission. Presented with severe abdominal pain with occasional vomiting and nausea. Placed on intravenous Cipro and Flagyl. Underwent sigmoidoscopy 06/05/2015 showing severe chronic inflammatory changes in the proximal sigmoid and descending colon. Biopsies diagnosed with Crohn disease maintained on Solu-Medrol as well as mesalamine. CT angiogram and imaging revealed free intraperitoneal air/perforated viscus likely perforated left colon from Crohn's colitis.  Underwent exploratory laparotomy drainage of intra-abdominal abscess partial colon resection application of wound VAC 06/09/2015 per Dr. Grandville Silos followed by reexploration of abdomen with closure creation of colostomy 06/11/2015.. Follow-up wound care nurse for ostomy care. Dressing changes wet-to-dry every shift. Hospital course respiratory failure required intubation followed by critical care medicine. She was extubated 06/14/2015. Hospital course acute blood loss anemia 7.0 transfused with latest hemoglobin 7.8 and monitored. Acute renal insufficiency creatinine 2.64-2.84. Renal ultrasound showing no hydronephrosis with latest creatinine 1. I will 9. Bouts of SVT responding to intravenous Cardizem transitioned to by mouth Lopressor and followed by cardiology services. Echocardiogram 06/18/2015 with ejection fraction of 16% normal systolic function. Subcutaneous heparin for DVT prophylaxis. Diet slowly advanced from clear liquids to mechanical soft. Physical therapy evaluation completed 06/15/2015 with recommendations of physical medicine rehabilitation consult. Patient to be admitted for a comprehensive inpatient rehabilitation program.     Past Medical History  Past Medical History  Diagnosis Date  . Asthma   . Sleep apnea   . Anemia   . Morbid obesity 03/22/2009  Qualifier: Diagnosis of  By: Ronnald Ramp CMA, Chem[place]    . OSA (obstructive sleep apnea) 05/02/2014  . Thyromegaly 05/02/2014  . Seasonal allergies 06/13/2014  . Acute acalculous cholecystitis 11/16/2014  . Diverticulitis of colon 11/17/2014  . Hepatic steatosis 11/17/2014    Family History  family history includes Allergies in her mother; Asthma in her brother; Cancer in her paternal grandmother; Diabetes in her maternal grandmother; Emphysema in her paternal grandfather; Hypertension in her father, maternal grandfather, maternal grandmother, mother, and sister.  Prior Rehab/Hospitalizations: No previous rehab admissions.  Has the patient  had major surgery during 100 days prior to admission? No  Current Medications   Current facility-administered medications:  .  dextrose 50 % solution 12.5 g, 12.5 g, Intravenous, PRN, Collene Gobble, MD, 12.5 g at 06/10/15 1151 .  feeding supplement (ENSURE ENLIVE) (ENSURE ENLIVE) liquid 237 mL, 237 mL, Oral, TID BM, Ardeen Garland, RD, Stopped at 06/26/15 1329 .  fentaNYL (SUBLIMAZE) injection 50-100 mcg, 50-100 mcg, Intravenous, Q2H PRN, Chesley Mires, MD, 100 mcg at 06/26/15 1002 .  furosemide (LASIX) tablet 40 mg, 40 mg, Oral, BID, Allie Bossier, MD, 40 mg at 06/26/15 1056 .  guaiFENesin (MUCINEX) 12 hr tablet 1,200 mg, 1,200 mg, Oral, BID PRN, Nat Christen, PA-C .  heparin injection 5,000 Units, 5,000 Units, Subcutaneous, 3 times per day, Chesley Mires, MD, 5,000 Units at 06/26/15 1328 .  HYDROcodone-acetaminophen (NORCO) 7.5-325 MG per tablet 2 tablet, 2 tablet, Oral, Q6H PRN, Fanny Skates, MD, 2 tablet at 06/26/15 1328 .  Influenza vac split quadrivalent PF (FLUARIX) injection 0.5 mL, 0.5 mL, Intramuscular, Tomorrow-1000, Maryann Mikhail, DO, 0.5 mL at 06/07/15 1400 .  insulin aspart (novoLOG) injection 0-15 Units, 0-15 Units, Subcutaneous, TID WC, Judeth Horn, MD, 0 Units at 06/25/15 0800 .  insulin aspart (novoLOG) injection 0-5 Units, 0-5 Units, Subcutaneous, QHS, Judeth Horn, MD, 0 Units at 06/24/15 2200 .  mesalamine (PENTASA) CR capsule 500 mg, 500 mg, Oral, QID, Cherene Altes, MD, 500 mg at 06/26/15 1328 .  metoCLOPramide (REGLAN) tablet 5 mg, 5 mg, Oral, TID AC, Megan N Baird, PA-C, 5 mg at 06/26/15 1328 .  metoprolol (LOPRESSOR) tablet 50 mg, 50 mg, Oral, BID, Verlee Monte, MD, 50 mg at 06/26/15 1056 .  ondansetron (ZOFRAN) injection 4 mg, 4 mg, Intravenous, Q6H PRN, Chesley Mires, MD, 4 mg at 06/20/15 2239 .  pantoprazole (PROTONIX) EC tablet 40 mg, 40 mg, Oral, Daily, Eudelia Bunch, RPH, 40 mg at 06/26/15 1056 .  pneumococcal 13-valent conjugate vaccine (PREVNAR 13)  injection 0.5 mL, 0.5 mL, Intramuscular, Tomorrow-1000, Maryann Mikhail, DO, 0.5 mL at 06/07/15 1736 .  polyethylene glycol (MIRALAX / GLYCOLAX) packet 17 g, 17 g, Oral, Daily, Fanny Skates, MD, 17 g at 06/26/15 1057 .  saccharomyces boulardii (FLORASTOR) capsule 250 mg, 250 mg, Oral, BID, Cherene Altes, MD, 250 mg at 06/26/15 1059 .  sodium chloride 0.9 % injection 10-40 mL, 10-40 mL, Intracatheter, Q12H, Cherene Altes, MD, 20 mL at 06/26/15 1058 .  sodium chloride 0.9 % injection 10-40 mL, 10-40 mL, Intracatheter, PRN, Cherene Altes, MD, 10 mL at 06/24/15 1801  Patients Current Diet: DIET SOFT Room service appropriate?: Yes; Fluid consistency:: Thin Diet - low sodium heart healthy  Precautions / Restrictions Precautions Precautions: Fall Precaution Comments: abdominal wound, colostomy Other Brace/Splint: abd binder when up Restrictions Weight Bearing Restrictions: No   Has the patient had 2 or more falls or a fall with injury in the  past year?No.  Had one fall [facility] 02/16 with no injury.  Prior Activity Level Community (5-7x/wk): Went out 3-4 X a week.  Did drive.  Home Assistive Devices / Equipment Home Assistive Devices/Equipment: None Home Equipment: None  Prior Device Use: Indicate devices/aids used by the patient prior to current illness, exacerbation or injury? None  Prior Functional Level Prior Function Level of Independence: Independent Comments: reports she was weak due to illness last several days at home, but did ascend 8 steps to parking lot and came to hospital via car  Self Care: Did the patient need help bathing, dressing, using the toilet or eating?  Independent  Indoor Mobility: Did the patient need assistance with walking from room to room (with or without device)? Independent  Stairs: Did the patient need assistance with internal or external stairs (with or without device)? Independent  Functional Cognition: Did the patient need help planning  regular tasks such as shopping or remembering to take medications? Independent  Current Functional Level Cognition  Overall Cognitive Status: Within Functional Limits for tasks assessed Orientation Level: Oriented X4    Extremity Assessment (includes Sensation/Coordination)  Upper Extremity Assessment: RUE deficits/detail, LUE deficits/detail RUE Deficits / Details: 3/5 shoulder, 4-/5 elbow to hand RUE Coordination: decreased gross motor LUE Deficits / Details: 2+/5 shoulder, 3+/5 elbow, 4-/5 grip LUE Coordination: decreased gross motor  Lower Extremity Assessment: Defer to PT evaluation RLE Deficits / Details: limited knee flexion to ~90 with AAROM; when asked to bend her knees up to roll, she bends knees in "frog position" with full external rotation and requires max assist to internally rotate to roll; unable to stand with +2 assist LLE Deficits / Details: limited knee flexion to ~90 with AAROM; when asked to bend her knees up to roll, she bends knees in "frog position" with full external rotation and requires max assist to internally rotate to roll; unable to stand with +2 assist    ADLs  Overall ADL's : Needs assistance/impaired Eating/Feeding: Set up, Sitting Eating/Feeding Details (indicate cue type and reason): sat at EOB, assist to set up tray as pt reliant on 1 hand on bed for sitting balance, increased spillage Grooming: Wash/dry hands, Wash/dry face, Bed level, Set up Upper Body Bathing: Maximal assistance, Bed level Lower Body Bathing: +2 for physical assistance, Total assistance, Bed level Upper Body Dressing : Moderate assistance, Bed level Upper Body Dressing Details (indicate cue type and reason): changed gown Lower Body Dressing: +2 for physical assistance, Total assistance, Bed level Toileting- Clothing Manipulation and Hygiene: +2 for physical assistance, Total assistance, Bed level Toileting - Clothing Manipulation Details (indicate cue type and reason): used  bedpan General ADL Comments: Pt total A for pericare and colostomy care. Colstomy leaked while we were helping her to roll to place maxi move pad. Nursing and and we A'd her with getting pt cleaned up and new colostomy bag on.    Mobility  Overal bed mobility: Needs Assistance Bed Mobility: Rolling Rolling: Max assist, +2 for physical assistance Sidelying to sit:  (+3 best) Supine to sit: +2 for physical assistance, Total assist (Pt does try to help, but due to her body habitus and the bed she is in it is diffcult for her to help) Sit to supine: Total assist (+3 (one for her trunk and one for each leg)) General bed mobility comments: Rolling multiple times to get on/off the bed pan    Transfers  Overall transfer level: Needs assistance Equipment used: Rolling walker (2 wheeled) (  bariatric) Transfer via Lift Equipment: Maximove Transfers: Sit to/from Stand Sit to Stand: Total assist, +2 physical assistance General transfer comment: Used maximove to transfer back to bed for toileting.  pt used UE to assist minimally upper trunk during fastening and unfastening clips.    Ambulation / Gait / Stairs / Office manager / Balance Dynamic Sitting Balance Sitting balance - Comments: sat x 20 minutes Balance Overall balance assessment: Needs assistance Sitting-balance support: Feet supported, Bilateral upper extremity supported Sitting balance-Leahy Scale: Fair Sitting balance - Comments: sat x 20 minutes Standing balance comment: unable to stand after 2 trials, may have to try tilt table.    Special needs/care consideration BiPAP/CPAP Has Bipap at home but does not always use it CPM No Continuous Drip IV No Dialysis No       Life Vest No Oxygen Yes, more at night while in the hospital.  Currently on an 02 sat monitor. Special Bed Yes on a Mighty Air low air loss bed Trach Size No Wound Vac (area) Yes, but was discontinued on 06/23/15 due to pus Skin Abdominal wound.   Has 2 areas of skin breakdown under her breasts.                          Bowel mgmt: Ostomy pouch Bladder mgmt: Voiding on bedpan Diabetic mgmt No    Previous Home Environment Living Arrangements: Children, Parent (86 y.o. daughter) Available Help at Discharge: Family Type of Home: Apartment Home Layout: One level Home Access: Stairs to enter Entrance Stairs-Rails: Right, Left, Can reach both Entrance Stairs-Number of Steps: 8 Bathroom Shower/Tub: Chiropodist: Standard Home Care Services: No  Discharge Living Setting Plans for Discharge Living Setting: Lives with (comment), Apartment (Lives with mom and 80 yo daughter.) Type of Home at Discharge: Apartment Discharge Home Layout: One level Discharge Home Access: Stairs to enter Entrance Stairs-Number of Steps: 9 steps entry Does the patient have any problems obtaining your medications?: No  Social/Family/Support Systems Patient Roles: Parent (Has a 37 yo daughter, mom and a sister.) Contact Information: Roselee Culver - mom [phone] Anticipated Caregiver: mom Ability/Limitations of Caregiver: Mom is living with patient and does not work.  Mom is caring for 1 yo grandchild.  Sister comes by daily. Caregiver Availability: 24/7 Discharge Plan Discussed with Primary Caregiver: Yes Is Caregiver In Agreement with Plan?: Yes Does Caregiver/Family have Issues with Lodging/Transportation while Pt is in Rehab?: No  Goals/Additional Needs Patient/Family Goal for Rehab: PT mod I, OT mod I/supervision goals Expected length of stay: 10-15 days Cultural Considerations: None Dietary Needs: Soft diet, thin liquids Equipment Needs: TBD Pt/Family Agrees to Admission and willing to participate: Yes Program Orientation Provided & Reviewed with Pt/Caregiver Including Roles  & Responsibilities: Yes  Decrease burden of Care through IP rehab admission: N/A  Possible need for SNF placement upon discharge: Not  planned  Patient Condition: This patient's medical and functional status has changed since the consult dated: 06/16/15 in which the Rehabilitation Physician determined and documented that the patient's condition is appropriate for intensive rehabilitative care in an inpatient rehabilitation facility. See "History of Present Illness" (above) for medical update. Functional changes are: Currently requiring Hoyer lift for transfers, sat up in chair for 2 hrs 06/25/15. Patient's medical and functional status update has been discussed with the Rehabilitation physician and patient remains appropriate for inpatient rehabilitation. Will admit to inpatient rehab today.  Preadmission Screen Completed  By:  [name], 06/26/2015 2:31 PM ______________________________________________________________________   Discussed status with Dr. Posey Pronto on 06/26/15 at 1431 and received telephone approval for admission today.  Admission Coordinator:  [name], time1431/Date10/03/16

## 2015-06-26 NOTE — Progress Notes (Signed)
Patient ID: Dominique [name], female   DOB: 1980/11/22, 34 y.o.   MRN: [id number]     [facility]      [address] [place], [place] [address]    Phone: [phone] FAX: [phone]     Subjective: Had [place] yesterday and threw up.  Appetite has been good.  Ostomy is functioning.  Afebrile.   Objective:  Vital signs:  Filed Vitals:   06/25/15 1445 06/25/15 2014 06/25/15 2138 06/26/15 0547  BP: 110/58 119/74 119/74 99/51  Pulse: 83 98 90 91  Temp: 98 F (36.7 C) 98.5 F (36.9 C)  98.7 F (37.1 C)  TempSrc: Oral Oral  Oral  Resp:  18  18  Height:      Weight:      SpO2: 100% 100%  100%    Last BM Date: 06/24/15  Intake/Output   Yesterday:  10/02 0701 - 10/03 0700 In: 717 [P.O.:697; I.V.:20] Out: 900 [Urine:670; Stool:230] This shift: I/O last 3 completed shifts: In: 287 [P.O.:697; I.V.:20] Out: 900 [Urine:670; Stool:230]    Physical Exam: General: Pt awake/alert/oriented x4 in no acute distress  Abdomen: Soft.  Nondistended.  Mildly tender at incisions only.  Incision is dry.  Left sided stoma is reddish brown, retracted, but functioning.  No evidence of peritonitis.  No incarcerated hernias.    Problem List:   Principal Problem:   Colitis Active Problems:   Morbid obesity- BMI 67.5   Iron deficiency anemia   Pressure ulcer   Acute respiratory failure with hypoxia (HCC)   Crohn's disease ([place])   Perforated sigmoid colon (HCC)   Peritonitis ([place])   Peritoneal abscess ([place])   OSA on CPAP   Obesity hypoventilation syndrome (HCC)   Acute renal insufficiency   Acute encephalopathy   Pulmonary hypertension ([place])   Cardiomyopathy- etiology not yet determined   Acute renal failure syndrome ([place])    Results:   Labs: Results for orders placed or performed during the hospital encounter of 06/03/15 (from the past 48 hour(s))  Glucose, capillary     Status: None   Collection Time: 06/24/15  8:16 AM  Result  Value Ref Range   Glucose-Capillary 93 65 - 99 mg/dL  Glucose, capillary     Status: Abnormal   Collection Time: 06/24/15 11:31 AM  Result Value Ref Range   Glucose-Capillary 111 (H) 65 - 99 mg/dL  Glucose, capillary     Status: Abnormal   Collection Time: 06/24/15  4:54 PM  Result Value Ref Range   Glucose-Capillary 133 (H) 65 - 99 mg/dL  CBC     Status: Abnormal   Collection Time: 06/24/15  8:10 PM  Result Value Ref Range   WBC 10.3 4.0 - 10.5 K/uL   RBC 3.86 (L) 3.87 - 5.11 MIL/uL   Hemoglobin 8.5 (L) 12.0 - 15.0 g/dL   HCT 28.7 (L) 36.0 - 46.0 %   MCV 74.4 (L) 78.0 - 100.0 fL   MCH 22.0 (L) 26.0 - 34.0 pg   MCHC 29.6 (L) 30.0 - 36.0 g/dL   RDW 27.4 (H) 11.5 - 15.5 %   Platelets 294 150 - 400 K/uL  Glucose, capillary     Status: Abnormal   Collection Time: 06/24/15 10:06 PM  Result Value Ref Range   Glucose-Capillary 103 (H) 65 - 99 mg/dL   Comment 1 Notify RN    Comment 2 Document in Chart   Renal function panel     Status: Abnormal  Collection Time: 06/25/15  4:27 AM  Result Value Ref Range   Sodium 134 (L) 135 - 145 mmol/L   Potassium 3.5 3.5 - 5.1 mmol/L   Chloride 103 101 - 111 mmol/L   CO2 27 22 - 32 mmol/L   Glucose, Bld 95 65 - 99 mg/dL   BUN 27 (H) 6 - 20 mg/dL   Creatinine, Ser 1.20 (H) 0.44 - 1.00 mg/dL   Calcium 7.6 (L) 8.9 - 10.3 mg/dL   Phosphorus 3.1 2.5 - 4.6 mg/dL   Albumin 1.6 (L) 3.5 - 5.0 g/dL   GFR calc non Af Amer 58 (L) >60 mL/min   GFR calc Af Amer >60 >60 mL/min    Comment: (NOTE) The eGFR has been calculated using the CKD EPI equation. This calculation has not been validated in all clinical situations. eGFR's persistently <60 mL/min signify possible Chronic Kidney Disease.    Anion gap 4 (L) 5 - 15  Glucose, capillary     Status: None   Collection Time: 06/25/15  7:41 AM  Result Value Ref Range   Glucose-Capillary 97 65 - 99 mg/dL   Comment 1 Notify RN   Glucose, capillary     Status: Abnormal   Collection Time: 06/25/15 11:49 AM   Result Value Ref Range   Glucose-Capillary 105 (H) 65 - 99 mg/dL  Glucose, capillary     Status: None   Collection Time: 06/25/15  5:01 PM  Result Value Ref Range   Glucose-Capillary 93 65 - 99 mg/dL  Glucose, capillary     Status: None   Collection Time: 06/25/15  9:30 PM  Result Value Ref Range   Glucose-Capillary 78 65 - 99 mg/dL   Comment 1 Notify RN   Renal function panel     Status: Abnormal   Collection Time: 06/26/15  5:40 AM  Result Value Ref Range   Sodium 135 135 - 145 mmol/L   Potassium 3.4 (L) 3.5 - 5.1 mmol/L   Chloride 103 101 - 111 mmol/L   CO2 27 22 - 32 mmol/L   Glucose, Bld 87 65 - 99 mg/dL   BUN 23 (H) 6 - 20 mg/dL   Creatinine, Ser 1.09 (H) 0.44 - 1.00 mg/dL   Calcium 7.5 (L) 8.9 - 10.3 mg/dL   Phosphorus 3.1 2.5 - 4.6 mg/dL   Albumin 1.5 (L) 3.5 - 5.0 g/dL   GFR calc non Af Amer >60 >60 mL/min   GFR calc Af Amer >60 >60 mL/min    Comment: (NOTE) The eGFR has been calculated using the CKD EPI equation. This calculation has not been validated in all clinical situations. eGFR's persistently <60 mL/min signify possible Chronic Kidney Disease.    Anion gap 5 5 - 15    Imaging / Studies: No results found.  Medications / Allergies:  Scheduled Meds: . feeding supplement (ENSURE ENLIVE)  237 mL Oral TID BM  . furosemide  40 mg Oral BID  . heparin subcutaneous  5,000 Units Subcutaneous 3 times per day  . Influenza vac split quadrivalent PF  0.5 mL Intramuscular Tomorrow-1000  . insulin aspart  0-15 Units Subcutaneous TID WC  . insulin aspart  0-5 Units Subcutaneous QHS  . mesalamine  500 mg Oral QID  . metoCLOPramide  5 mg Oral TID AC  . metoprolol tartrate  50 mg Oral BID  . pantoprazole  40 mg Oral Daily  . pneumococcal 13-valent conjugate vaccine  0.5 mL Intramuscular Tomorrow-1000  . polyethylene glycol  17 g Oral  Daily  . saccharomyces boulardii  250 mg Oral BID  . sodium chloride  10-40 mL Intracatheter Q12H   Continuous Infusions:  PRN  Meds:.dextrose, fentaNYL (SUBLIMAZE) injection, guaiFENesin, HYDROcodone-acetaminophen, ondansetron, sodium chloride  Antibiotics: Anti-infectives    Start     Dose/Rate Route Frequency Ordered Stop   06/14/15 1800  ciprofloxacin (CIPRO) IVPB 200 mg     200 mg 100 mL/hr over 60 Minutes Intravenous Every 12 hours 06/14/15 1438 06/16/15 1834   06/10/15 1800  ciprofloxacin (CIPRO) IVPB 400 mg  Status:  Discontinued     400 mg 200 mL/hr over 60 Minutes Intravenous Every 12 hours 06/10/15 1734 06/14/15 1438   06/09/15 1927  ciprofloxacin (CIPRO) 400 MG/200ML IVPB    Comments:  Ubaldo Glassing   : cabinet override      06/09/15 1927 06/09/15 1953   06/09/15 1830  ciprofloxacin (CIPRO) IVPB 400 mg  Status:  Discontinued     400 mg 200 mL/hr over 60 Minutes Intravenous  Once 06/09/15 1825 06/10/15 1734   06/09/15 1830  metroNIDAZOLE (FLAGYL) IVPB 500 mg     500 mg 100 mL/hr over 60 Minutes Intravenous Every 8 hours 06/09/15 1825 06/16/15 1930   06/04/15 1200  metroNIDAZOLE (FLAGYL) IVPB 500 mg  Status:  Discontinued     500 mg 100 mL/hr over 60 Minutes Intravenous Every 8 hours 06/04/15 1034 06/07/15 1405   06/04/15 0015  cefTRIAXone (ROCEPHIN) 1 g in dextrose 5 % 50 mL IVPB     1 g 100 mL/hr over 30 Minutes Intravenous  Once 06/04/15 0012 06/04/15 0108       Assessment/Plan Perforated descending colon, colitis POD 15,17 s/p ex lap with DRAINAGE OF INTRA-ABDOMINAL ABSCESS, PARTIAL COLON RESECTION, re-exploration and creation of colostomy and abdominal closure---Dr. Lanier Clam - Tolerating soft diet - Pt continuing to progress with PT - Inpatient rehab when medically stable - BID wet to dry dressing changes, will assess wound this afternoon. -continue to watch ostomy, mucocutaneous separation -miralax  ID-would recommend removing central line  VTE prophylaxis-SCD's and Heparin Suspected cardiomyopathy secondary to sepsis- Cardiology following, beta blocker IBD as seen on pathology-  mesalamine/probiotics, steroids tapered off.   AKI-resolved  Disp-CIR when medically stable, we can follow her at rehab  Erby Pian, [facility] Pager [phone]) For consults and floor pages call [phone](7A-4:30P)  06/26/2015 7:44 AM

## 2015-06-27 ENCOUNTER — Inpatient Hospital Stay (HOSPITAL_COMMUNITY): Payer: Medicaid Other | Admitting: Occupational Therapy

## 2015-06-27 ENCOUNTER — Inpatient Hospital Stay (HOSPITAL_COMMUNITY): Payer: Medicaid Other

## 2015-06-27 DIAGNOSIS — R5381 Other malaise: Principal | ICD-10-CM

## 2015-06-27 LAB — COMPREHENSIVE METABOLIC PANEL
ALBUMIN: 1.4 g/dL — AB (ref 3.5–5.0)
ALT: 20 U/L (ref 14–54)
AST: 28 U/L (ref 15–41)
Alkaline Phosphatase: 102 U/L (ref 38–126)
Anion gap: 5 (ref 5–15)
BUN: 21 mg/dL — AB (ref 6–20)
CHLORIDE: 104 mmol/L (ref 101–111)
CO2: 29 mmol/L (ref 22–32)
Calcium: 7.6 mg/dL — ABNORMAL LOW (ref 8.9–10.3)
Creatinine, Ser: 1.07 mg/dL — ABNORMAL HIGH (ref 0.44–1.00)
GFR calc Af Amer: >60 mL/min (ref >60)
GLUCOSE: 91 mg/dL (ref 65–99)
POTASSIUM: 3.4 mmol/L — AB (ref 3.5–5.1)
Sodium: 138 mmol/L (ref 135–145)
Total Bilirubin: 0.6 mg/dL (ref 0.3–1.2)
Total Protein: 4.3 g/dL — ABNORMAL LOW (ref 6.5–8.1)

## 2015-06-27 LAB — GLUCOSE, CAPILLARY
GLUCOSE-CAPILLARY: 109 mg/dL — AB (ref 65–99)
GLUCOSE-CAPILLARY: 108 mg/dL — AB (ref 65–99)
Glucose-Capillary: 123 mg/dL — ABNORMAL HIGH (ref 65–99)
Glucose-Capillary: 119 mg/dL — ABNORMAL HIGH (ref 65–99)

## 2015-06-27 LAB — CBC WITH DIFFERENTIAL/PLATELET
BASOS ABS: 0 10*3/uL (ref 0.0–0.1)
Basophils Relative: 0 %
EOS ABS: 0.2 10*3/uL (ref 0.0–0.7)
Eosinophils Relative: 3 %
HCT: 25.6 % — ABNORMAL LOW (ref 36.0–46.0)
Hemoglobin: 7.6 g/dL — ABNORMAL LOW (ref 12.0–15.0)
LYMPHS ABS: 0.5 10*3/uL — AB (ref 0.7–4.0)
Lymphocytes Relative: 8 %
MCH: 22.6 pg — ABNORMAL LOW (ref 26.0–34.0)
MCHC: 29.7 g/dL — ABNORMAL LOW (ref 30.0–36.0)
MCV: 76.2 fL — ABNORMAL LOW (ref 78.0–100.0)
MONO ABS: 1 10*3/uL (ref 0.1–1.0)
Monocytes Relative: 16 %
Neutro Abs: 4.8 10*3/uL (ref 1.7–7.7)
Neutrophils Relative %: 73 %
PLATELETS: 258 10*3/uL (ref 150–400)
RBC: 3.36 MIL/uL — AB (ref 3.87–5.11)
RDW: 28.6 % — AB (ref 11.5–15.5)
WBC: 6.5 10*3/uL (ref 4.0–10.5)

## 2015-06-27 MED ORDER — POTASSIUM CHLORIDE CRYS ER 20 MEQ PO TBCR
20.0000 meq | EXTENDED_RELEASE_TABLET | Freq: Every day | ORAL | Status: DC
  Administered 2015-06-27 – 2015-07-19 (×20): 20 meq via ORAL
  Filled 2015-06-27 (×25): qty 1

## 2015-06-27 NOTE — Telephone Encounter (Signed)
Pt admitted to Inpatient Rehabilitation 06/26/15, pt currently an inpatient.

## 2015-06-27 NOTE — Progress Notes (Signed)
Patient information reviewed and entered into eRehab system by Azan Maneri, RN, CRRN, PPS Coordinator.  Information including medical coding and functional independence measure will be reviewed and updated through discharge.    

## 2015-06-27 NOTE — Progress Notes (Signed)
[facility] Individual Statement of Services  Patient Name:  Dominique [name]  Date:  06/27/2015  Welcome to the [facility].  Our goal is to provide you with an individualized program based on your diagnosis and situation, designed to meet your specific needs.  With this comprehensive rehabilitation program, you will be expected to participate in at least 3 hours of rehabilitation therapies Monday-Friday, with modified therapy programming on the weekends.  Your rehabilitation program will include the following services:  Physical Therapy (PT), Occupational Therapy (OT), 24 hour per day rehabilitation nursing, Neuropsychology, Case Management (Social Worker), Rehabilitation Medicine, Nutrition Services and Pharmacy Services  Weekly team conferences will be held on Wednesdays to discuss your progress.  Your Social Worker will talk with you frequently to get your input and to update you on team discussions.  Team conferences with you and your family in attendance may also be held.  Expected length of stay:  3 to 4 weeks  Overall anticipated outcome:  Supervision with some assistance for bathing/dressing, stairs, and car transfers  Depending on your progress and recovery, your program may change. Your Social Worker will coordinate services and will keep you informed of any changes. Your Social Worker's name and contact numbers are listed  below.  The following services may also be recommended but are not provided by the [facility] will be made to provide these services after discharge if needed.  Arrangements include referral to agencies that provide these services.  Your insurance has been verified to be:  Medicaid Your primary doctor is:  Dr. Iona Beard Osei-Bonsu  Pertinent information will be shared  with your doctor and your insurance company.  Social Worker:  Alfonse Alpers, LCSW  [phone] or [phone]  Information discussed with and copy given to patient by: Trey Sailors, 06/27/2015, 12:41 PM

## 2015-06-27 NOTE — Evaluation (Signed)
Physical Therapy Assessment and Plan  Patient Details  Name: Brittanyann Wittner MRN: [id number] Date of Birth: [birth year]  PT Diagnosis: Abnormal posture, Difficulty walking, Muscle weakness and Pain in abdomen Rehab Potential: Fair ELOS: 3-4 weeks   Today's Date: 06/27/2015 PT Individual Time: 1100-1155 PT Individual Time Calculation (min): 55 min    Problem List:  Patient Active Problem List   Diagnosis Date Noted  . Debilitated 06/26/2015  . Acute renal failure syndrome ([place])   . Cardiomyopathy- etiology not yet determined 06/20/2015  . Chronic systolic heart failure ([place])   . Pulmonary hypertension ([place])   . Chronic systolic CHF (congestive heart failure) ([place])   . Acute respiratory failure with hypoxia ([place])   . Crohn's disease ([place])   . Blood poisoning ([place])   . Perforated sigmoid colon ([place])   . Peritonitis ([place])   . Peritoneal abscess ([place])   . OSA on CPAP   . Obesity hypoventilation syndrome ([place])   . Acute renal insufficiency   . Acute encephalopathy   . Pressure ulcer 06/16/2015  . Protein-calorie malnutrition, severe ([place]) 06/10/2015  . Colitis 06/04/2015  . Generalized abdominal pain   . Gastritis 04/07/2015  . Iron deficiency anemia 04/07/2015  . Sinus tachycardia ([place]) 12/29/2014  . Lumbar back pain 12/06/2014  . Fluid retention 12/01/2014  . Abdominal pain   . Fall 11/20/2014  . Rectal bleeding 11/19/2014  . Diverticulitis of colon 11/17/2014  . Hepatic steatosis 11/17/2014  . Elevated LFTs 11/17/2014  . Seasonal allergies 06/13/2014  . OSA (obstructive sleep apnea) 05/02/2014  . Thyromegaly 05/02/2014  . AMENORRHEA 02/07/2010  . Morbid obesity- BMI 67.5 03/22/2009    Past Medical History:  Past Medical History  Diagnosis Date  . Asthma   . Sleep apnea   . Anemia   . Morbid obesity 03/22/2009    Qualifier: Diagnosis of  By: Ronnald Ramp CMA, [place]    . OSA (obstructive sleep apnea) 05/02/2014  . Thyromegaly 05/02/2014  . Seasonal allergies 06/13/2014  .  Acute acalculous cholecystitis 11/16/2014  . Diverticulitis of colon 11/17/2014  . Hepatic steatosis 11/17/2014   Past Surgical History:  Past Surgical History  Procedure Laterality Date  . C section 2011  2011  . Flexible sigmoidoscopy N/A 06/05/2015    Procedure: FLEXIBLE SIGMOIDOSCOPY;  Surgeon: Ronald Lobo, MD;  Location: [facility];  Service: Endoscopy;  Laterality: N/A;  . Laparotomy N/A 06/09/2015    Procedure: EXPLORATORY LAPAROTOMY, DRAINAGE OF INTRAABDOMINAL ABSCESS, PARTIAL COLON RESECTION,  APPLICATION OF WOUND VAC;  Surgeon: Georganna Skeans, MD;  Location: [facility];  Service: General;  Laterality: N/A;  . Laparotomy N/A 06/11/2015    Procedure: RE-EXPLORATION OF ABDOMEN;  Surgeon: Georganna Skeans, MD;  Location: [facility];  Service: General;  Laterality: N/A;  . Colostomy N/A 06/11/2015    Procedure:  CREATION OF COLOSTOMY;  Surgeon: Georganna Skeans, MD;  Location: [facility];  Service: General;  Laterality: N/A;    Assessment & Plan Clinical Impression: Patient is a 34 y.o. year old right handed morbidly obese female admitted 06/04/2015 with noted history of asthma, chronic systolic congestive heart failure and recent recurrent colitis since January 2016. Patient lives with family independent prior to admission. Presented with severe abdominal pain with occasional vomiting and nausea. Placed on intravenous Cipro and Flagyl. Underwent sigmoidoscopy 06/05/2015 showing severe chronic inflammatory changes in the proximal sigmoid and descending colon. Biopsies diagnosed with Crohn disease maintained on Solu-Medrol as well as mesalamine. CT angiogram and imaging revealed free intraperitoneal air/perforated viscus likely perforated  left colon from Crohn's colitis. Underwent exploratory laparotomy drainage of intra-abdominal abscess partial colon resection application of wound VAC 06/09/2015 per Dr. Grandville Silos followed by reexploration of abdomen with closure creation of colostomy 06/11/2015.. Follow-up wound  care nurse for ostomy care. Dressing changes wet-to-dry every shift. Hospital course respiratory failure required intubation followed by critical care medicine. She was extubated 06/14/2015. Hospital course acute blood loss anemia 7.0 transfused with latest hemoglobin 7.8 and monitored. Acute renal insufficiency creatinine 2.64-2.84. Renal ultrasound showing no hydronephrosis with latest creatinine 1. I will 9. Bouts of SVT responding to intravenous Cardizem transitioned to by mouth Lopressor and followed by cardiology services. Echocardiogram 06/18/2015 with ejection fraction of 33% normal systolic function. Subcutaneous heparin for DVT prophylaxis. Diet slowly advanced from clear liquids to mechanical soft. Physical therapy evaluation completed 06/15/2015 with recommendations of physical medicine rehabilitation consult. Patient transferred to CIR on 06/26/2015 .   Patient currently requires total +2 with lift equipment with mobility secondary to muscle weakness and muscle joint tightness, decreased cardiorespiratoy endurance and decreased sitting balance, decreased standing balance, decreased postural control and decreased balance strategies.  Prior to hospitalization, patient was independent  with mobility and lived with Daughter, Other (Comment) (mother) in a [place] home.  Home access is 8Stairs to enter.  Patient will benefit from skilled PT intervention to maximize safe functional mobility, minimize fall risk and decrease caregiver burden for planned discharge home with 24 hour supervision.  Anticipate patient will benefit from follow up [place] [facility] at discharge.  PT - End of Session Activity Tolerance: Decreased this session Endurance Deficit: Yes PT Assessment Rehab Potential (ACUTE/IP ONLY): Fair Barriers to Discharge: Inaccessible home environment;Decreased caregiver support PT Patient demonstrates impairments in the following area(s): Balance;Endurance;Motor;Skin Integrity;Pain PT Transfers  Functional Problem(s): Bed Mobility;Bed to Chair;Car;Furniture PT Locomotion Functional Problem(s): Ambulation;Stairs;Wheelchair Mobility PT Plan PT Intensity: Minimum of 1-2 x/day ,45 to 90 minutes PT Frequency: 5 out of 7 days PT Duration Estimated Length of Stay: 3-4 weeks PT Treatment/Interventions: Ambulation/gait training;Balance/vestibular training;Community reintegration;Discharge planning;Disease management/prevention;DME/adaptive equipment instruction;Functional mobility training;Neuromuscular re-education;Pain management;Patient/family education;Psychosocial support;Skin care/wound management;Splinting/orthotics;Stair training;Therapeutic Activities;Therapeutic Exercise;UE/LE Strength taining/ROM;UE/LE Coordination activities;Wheelchair propulsion/positioning PT Transfers Anticipated Outcome(s): S basic transfers; mod A car transfers PT Locomotion Anticipated Outcome(s): S short distance/household gait; mod I w/c propulsion; min A stairs PT Recommendation Follow Up Recommendations: Home health PT;24 hour supervision/assistance Patient destination: Home Equipment Recommended: Rolling walker with 5" wheels  Skilled Therapeutic Intervention Individual treatment initiated with focus on bed mobility re-training including rolling R <-> L, scooting and repositioning in bed, and supine <-> sit, endurance and activity tolerance, initiating sitting balance training, and orienting patient to rehab goals and reviewing patient's personal goals. Pt requires total +2 for all mobility but focus on pt directing care and initiating as much as physically possible with verbal cues for technique and hand placement. Pt requires frequent rest breaks due to decreased endurance and increased anxiety/fear with mobility. Ordered appropriate bariatric equipment from [employer] vendor at the hospital in order to initiate [place] in future session including bariatric recliner, w/c and BSC.   PT  Evaluation Precautions/Restrictions Precautions Precautions: Fall Precaution Comments: abdominal wound, colostomy Required Braces or Orthoses: Other Brace/Splint Other Brace/Splint: abdominal binder when OOB Restrictions Weight Bearing Restrictions: No Pain   Home Living/Prior Functioning Home Living Living Arrangements: Children;Parent Available Help at Discharge: Family Type of Home: Apartment Home Access: Stairs to enter Technical brewer of Steps: 8 Entrance Stairs-Rails: Right;Left;Can reach both Home Layout: One level  Lives With: Daughter;Other (Comment) (mother) Prior  Function Level of Independence: Independent with gait;Independent with transfers;Independent with basic ADLs;Independent with homemaking with ambulation  Able to Take Stairs?: Yes Driving: No Vocation: Unemployed Comments: Has 13 year old daughter Vision/Perception  Vision - History Visual History:  (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.)  Cognition Overall Cognitive Status: Within Functional Limits for tasks assessed Arousal/Alertness: Awake/alert Attention: Selective Selective Attention: Appears intact Memory: Appears intact Awareness: Appears intact Problem Solving: Impaired Problem Solving Impairment: Functional complex Safety/Judgment: Appears intact Comments: anxious and fearful with mobility Sensation Sensation Light Touch: Appears Intact Stereognosis: Appears Intact Hot/Cold: Appears Intact Proprioception: Appears Intact Coordination Gross Motor Movements are Fluid and Coordinated: No Fine Motor Movements are Fluid and Coordinated: Yes (@ BUE) Motor  Motor Motor: Other (comment) (significant generalized weakness)  Trunk/Postural Assessment  Cervical Assessment Cervical Assessment: Within Functional Limits Thoracic Assessment Thoracic Assessment: Exceptions to [facility] (limited due to body habitus) Lumbar Assessment Lumbar Assessment: Exceptions to [facility] (limited due to body  habitus) Postural Control Postural Control: Deficits on evaluation Trunk Control: impaired due to significant generalized weakness Postural Limitations: body habitus limiting upright tolerance and mobility  Balance Balance Balance Assessed: Yes Static Sitting Balance Static Sitting - Level of Assistance: 1: +2 Total assist Extremity Assessment  RUE Assessment RUE Assessment: Within Functional Limits LUE Assessment LUE Assessment: Within Functional Limits RLE Assessment RLE Assessment: Exceptions to [facility] RLE Strength RLE Overall Strength Comments: decreased due to weakness and weight of extemities; able to move LE's in gravity eliminated position LLE Assessment LLE Assessment: Exceptions to [facility] LLE Strength LLE Overall Strength Comments: decreased due to weakness and weight of extemities; able to move LE's in gravity eliminated position   See Function Navigator for Current Functional Status.   Refer to Care Plan for Long Term Goals  Recommendations for other services: None  Discharge Criteria: Patient will be discharged from PT if patient refuses treatment 3 consecutive times without medical reason, if treatment goals not met, if there is a change in medical status, if patient makes no progress towards goals or if patient is discharged from hospital.  The above assessment, treatment plan, treatment alternatives and goals were discussed and mutually agreed upon: by patient  Juanna Cao, PT, DPT  06/27/2015, 1:19 PM

## 2015-06-27 NOTE — Progress Notes (Signed)
Patient ID: Dominique [name], female   DOB: May 16, 1981, 34 y.o.   MRN: [id number]     [facility]      [address] [place], [place] [address]    Phone: [phone] FAX: [phone]     Subjective: Ostomy putting out per patient.  Tolerating POs.   Objective:  Vital signs:  Filed Vitals:   06/26/15 1755 06/26/15 2010 06/27/15 0434  BP: 105/54 124/59 116/58  Pulse: 91 90 94  Temp: 98.7 F (37.1 C)  98.9 F (37.2 C)  TempSrc: Oral  Oral  Resp: 18  18  SpO2: 95%  97%    Last BM Date: 06/26/15 (ostomy)  Intake/Output   Yesterday:  10/03 0701 - 10/04 0700 In: 480 [P.O.:480] Out: 850 [Urine:800; Stool:50] This shift:  Total I/O In: 120 [P.O.:120] Out: 300 [Urine:300]  Physical Exam: General: Pt awake/alert/oriented x4 in no acute distress Abdomen: Soft.  Nondistended. Dressing is c/d/i.  Stoma is separated, but not retracted into the abdominal wall, fibrinous yellow brown exudate.   Problem List:   Principal Problem:   Debilitated Active Problems:   Morbid obesity- BMI 67.5   OSA (obstructive sleep apnea)   Crohn's disease (HCC)   Chronic systolic CHF (congestive heart failure) ([place])    Results:   Labs: Results for orders placed or performed during the hospital encounter of 06/26/15 (from the past 48 hour(s))  CBC     Status: Abnormal   Collection Time: 06/26/15  7:55 PM  Result Value Ref Range   WBC 6.5 4.0 - 10.5 K/uL   RBC 3.43 (L) 3.87 - 5.11 MIL/uL   Hemoglobin 7.9 (L) 12.0 - 15.0 g/dL   HCT 25.9 (L) 36.0 - 46.0 %   MCV 75.5 (L) 78.0 - 100.0 fL   MCH 23.0 (L) 26.0 - 34.0 pg   MCHC 30.5 30.0 - 36.0 g/dL   RDW 28.8 (H) 11.5 - 15.5 %   Platelets 254 150 - 400 K/uL  Creatinine, serum     Status: Abnormal   Collection Time: 06/26/15  7:55 PM  Result Value Ref Range   Creatinine, Ser 1.18 (H) 0.44 - 1.00 mg/dL   GFR calc non Af Amer 59 (L) >60 mL/min   GFR calc Af Amer >60 >60 mL/min    Comment:  (NOTE) The eGFR has been calculated using the CKD EPI equation. This calculation has not been validated in all clinical situations. eGFR's persistently <60 mL/min signify possible Chronic Kidney Disease.   Glucose, capillary     Status: None   Collection Time: 06/26/15  9:05 PM  Result Value Ref Range   Glucose-Capillary 84 65 - 99 mg/dL  CBC WITH DIFFERENTIAL     Status: Abnormal (Preliminary result)   Collection Time: 06/27/15  5:43 AM  Result Value Ref Range   WBC 6.5 4.0 - 10.5 K/uL    Comment: REPEATED TO VERIFY CONSISTENT WITH PREVIOUS RESULT    RBC 3.36 (L) 3.87 - 5.11 MIL/uL   Hemoglobin 7.6 (L) 12.0 - 15.0 g/dL    Comment: REPEATED TO VERIFY CONSISTENT WITH PREVIOUS RESULT    HCT 25.6 (L) 36.0 - 46.0 %   MCV 76.2 (L) 78.0 - 100.0 fL   MCH 22.6 (L) 26.0 - 34.0 pg   MCHC 29.7 (L) 30.0 - 36.0 g/dL   RDW 28.6 (H) 11.5 - 15.5 %   Platelets 258 150 - 400 K/uL    Comment: REPEATED TO VERIFY CONSISTENT WITH PREVIOUS  RESULT    Neutrophils Relative % PENDING %   Neutro Abs PENDING 1.7 - 7.7 K/uL   Band Neutrophils PENDING %   Lymphocytes Relative PENDING %   Lymphs Abs PENDING 0.7 - 4.0 K/uL   Monocytes Relative PENDING %   Monocytes Absolute PENDING 0.1 - 1.0 K/uL   Eosinophils Relative PENDING %   Eosinophils Absolute PENDING 0.0 - 0.7 K/uL   Basophils Relative PENDING %   Basophils Absolute PENDING 0.0 - 0.1 K/uL   WBC Morphology PENDING    RBC Morphology PENDING    Smear Review PENDING    nRBC PENDING 0 /100 WBC   Metamyelocytes Relative PENDING %   Myelocytes PENDING %   Promyelocytes Absolute PENDING %   Blasts PENDING %  Comprehensive metabolic panel     Status: Abnormal   Collection Time: 06/27/15  5:43 AM  Result Value Ref Range   Sodium 138 135 - 145 mmol/L   Potassium 3.4 (L) 3.5 - 5.1 mmol/L   Chloride 104 101 - 111 mmol/L   CO2 29 22 - 32 mmol/L   Glucose, Bld 91 65 - 99 mg/dL   BUN 21 (H) 6 - 20 mg/dL   Creatinine, Ser 1.07 (H) 0.44 - 1.00 mg/dL    Calcium 7.6 (L) 8.9 - 10.3 mg/dL   Total Protein 4.3 (L) 6.5 - 8.1 g/dL   Albumin 1.4 (L) 3.5 - 5.0 g/dL   AST 28 15 - 41 U/L   ALT 20 14 - 54 U/L   Alkaline Phosphatase 102 38 - 126 U/L   Total Bilirubin 0.6 0.3 - 1.2 mg/dL   GFR calc non Af Amer >60 >60 mL/min   GFR calc Af Amer >60 >60 mL/min    Comment: (NOTE) The eGFR has been calculated using the CKD EPI equation. This calculation has not been validated in all clinical situations. eGFR's persistently <60 mL/min signify possible Chronic Kidney Disease.    Anion gap 5 5 - 15  Glucose, capillary     Status: Abnormal   Collection Time: 06/27/15  6:41 AM  Result Value Ref Range   Glucose-Capillary 109 (H) 65 - 99 mg/dL    Imaging / Studies: No results found.  Medications / Allergies:  Scheduled Meds: . feeding supplement (ENSURE ENLIVE)  237 mL Oral TID BM  . furosemide  40 mg Oral BID  . heparin  5,000 Units Subcutaneous 3 times per day  . insulin aspart  0-15 Units Subcutaneous TID WC  . metoCLOPramide  5 mg Oral TID AC  . metoprolol tartrate  50 mg Oral BID  . pantoprazole  40 mg Oral Daily  . polyethylene glycol  17 g Oral Daily  . potassium chloride  20 mEq Oral Daily  . saccharomyces boulardii  250 mg Oral BID   Continuous Infusions:  PRN Meds:.guaiFENesin, HYDROcodone-acetaminophen, ondansetron **OR** ondansetron (ZOFRAN) IV, sodium chloride, sorbitol  Antibiotics: Anti-infectives    None        Assessment/Plan Perforated descending colon, colitis POD 16,18 s/p ex lap with DRAINAGE OF INTRA-ABDOMINAL ABSCESS, PARTIAL COLON RESECTION, re-exploration and creation of colostomy and abdominal closure---Dr. Lanier Clam - Tolerating soft diet - TID wet to dry dressing changes, will re-assess wound later today.  mepitel at the base as the wound has dehisced, then wet to dry. -watch colostomy, viable, but has mucocutaneous separation  -miralax -will continue to follow wound/ostomy -up with therapies -abd  binder  Erby Pian, ANP-BC [facility] Pager [phone](7A-4:30P) For consults and floor  pages call [phone](7A-4:30P)  06/27/2015  9:25 AM

## 2015-06-27 NOTE — Progress Notes (Signed)
34 y.o. right handed morbidly obese female admitted 06/04/2015 with noted history of asthma, chronic systolic congestive heart failure and recent recurrent colitis since January 2016. Patient lives with family independent prior to admission. Presented with severe abdominal pain with occasional vomiting and nausea. Placed on intravenous Cipro and Flagyl. Underwent sigmoidoscopy 06/05/2015 showing severe chronic inflammatory changes in the proximal sigmoid and descending colon. Biopsies diagnosed with Crohn disease maintained on Solu-Medrol as well as mesalamine. CT angiogram and imaging revealed free intraperitoneal air/perforated viscus likely perforated left colon from Crohn's colitis. Underwent exploratory laparotomy drainage of intra-abdominal abscess partial colon resection application of wound VAC 06/09/2015 per Dr. Grandville Silos followed by reexploration of abdomen with closure creation of colostomy 06/11/2015.. Follow-up wound care nurse for ostomy care. Dressing changes wet-to-dry every shift. Hospital course respiratory failure required intubation followed by critical care medicine. She was extubated 06/14/2015. Hospital course acute blood loss anemia 7.0 transfused with latest hemoglobin 7.8 and monitored. Acute renal insufficiency creatinine 2.64-2.84. Renal ultrasound showing no hydronephrosis  Subjective/Complaints:   Objective: Vital Signs: Blood pressure 116/58, pulse 94, temperature 98.9 F (37.2 C), temperature source Oral, resp. rate 18, SpO2 97 %. No results found. Results for orders placed or performed during the hospital encounter of 06/26/15 (from the past 72 hour(s))  CBC     Status: Abnormal   Collection Time: 06/26/15  7:55 PM  Result Value Ref Range   WBC 6.5 4.0 - 10.5 K/uL   RBC 3.43 (L) 3.87 - 5.11 MIL/uL   Hemoglobin 7.9 (L) 12.0 - 15.0 g/dL   HCT 25.9 (L) 36.0 - 46.0 %   MCV 75.5 (L) 78.0 - 100.0 fL   MCH 23.0 (L) 26.0 - 34.0 pg   MCHC 30.5 30.0 - 36.0 g/dL   RDW 28.8  (H) 11.5 - 15.5 %   Platelets 254 150 - 400 K/uL  Creatinine, serum     Status: Abnormal   Collection Time: 06/26/15  7:55 PM  Result Value Ref Range   Creatinine, Ser 1.18 (H) 0.44 - 1.00 mg/dL   GFR calc non Af Amer 59 (L) >60 mL/min   GFR calc Af Amer >60 >60 mL/min    Comment: (NOTE) The eGFR has been calculated using the CKD EPI equation. This calculation has not been validated in all clinical situations. eGFR's persistently <60 mL/min signify possible Chronic Kidney Disease.   Glucose, capillary     Status: None   Collection Time: 06/26/15  9:05 PM  Result Value Ref Range   Glucose-Capillary 84 65 - 99 mg/dL  CBC WITH DIFFERENTIAL     Status: Abnormal (Preliminary result)   Collection Time: 06/27/15  5:43 AM  Result Value Ref Range   WBC 6.5 4.0 - 10.5 K/uL    Comment: REPEATED TO VERIFY CONSISTENT WITH PREVIOUS RESULT    RBC 3.36 (L) 3.87 - 5.11 MIL/uL   Hemoglobin 7.6 (L) 12.0 - 15.0 g/dL    Comment: REPEATED TO VERIFY CONSISTENT WITH PREVIOUS RESULT    HCT 25.6 (L) 36.0 - 46.0 %   MCV 76.2 (L) 78.0 - 100.0 fL   MCH 22.6 (L) 26.0 - 34.0 pg   MCHC 29.7 (L) 30.0 - 36.0 g/dL   RDW 28.6 (H) 11.5 - 15.5 %   Platelets 258 150 - 400 K/uL    Comment: REPEATED TO VERIFY CONSISTENT WITH PREVIOUS RESULT    Neutrophils Relative % PENDING %   Neutro Abs PENDING 1.7 - 7.7 K/uL   Band Neutrophils PENDING %  Lymphocytes Relative PENDING %   Lymphs Abs PENDING 0.7 - 4.0 K/uL   Monocytes Relative PENDING %   Monocytes Absolute PENDING 0.1 - 1.0 K/uL   Eosinophils Relative PENDING %   Eosinophils Absolute PENDING 0.0 - 0.7 K/uL   Basophils Relative PENDING %   Basophils Absolute PENDING 0.0 - 0.1 K/uL   WBC Morphology PENDING    RBC Morphology PENDING    Smear Review PENDING    nRBC PENDING 0 /100 WBC   Metamyelocytes Relative PENDING %   Myelocytes PENDING %   Promyelocytes Absolute PENDING %   Blasts PENDING %  Comprehensive metabolic panel     Status: Abnormal    Collection Time: 06/27/15  5:43 AM  Result Value Ref Range   Sodium 138 135 - 145 mmol/L   Potassium 3.4 (L) 3.5 - 5.1 mmol/L   Chloride 104 101 - 111 mmol/L   CO2 29 22 - 32 mmol/L   Glucose, Bld 91 65 - 99 mg/dL   BUN 21 (H) 6 - 20 mg/dL   Creatinine, Ser 1.07 (H) 0.44 - 1.00 mg/dL   Calcium 7.6 (L) 8.9 - 10.3 mg/dL   Total Protein 4.3 (L) 6.5 - 8.1 g/dL   Albumin 1.4 (L) 3.5 - 5.0 g/dL   AST 28 15 - 41 U/L   ALT 20 14 - 54 U/L   Alkaline Phosphatase 102 38 - 126 U/L   Total Bilirubin 0.6 0.3 - 1.2 mg/dL   GFR calc non Af Amer >60 >60 mL/min   GFR calc Af Amer >60 >60 mL/min    Comment: (NOTE) The eGFR has been calculated using the CKD EPI equation. This calculation has not been validated in all clinical situations. eGFR's persistently <60 mL/min signify possible Chronic Kidney Disease.    Anion gap 5 5 - 15  Glucose, capillary     Status: Abnormal   Collection Time: 06/27/15  6:41 AM  Result Value Ref Range   Glucose-Capillary 109 (H) 65 - 99 mg/dL     HEENT: light sensitive Right eye Cardio: RRR and no murmur Resp: CTA B/L and unlabored GI: BS positive and LLQ colostomy, midline open abd wound with Wet to Dry , small amt serosamguinous drainage on dressing Extremity:  No Edema Skin:   Wound see above, no heel ulceration Neuro: Alert/Oriented and Abnormal Motor 5/5 in BUE, 3- bilateral HF, 4- Knee ext and ankle DF/PF Musc/Skel:  Other no pain with UE and LE ROM Gen NAD   Assessment/Plan: 1. Functional deficits secondary to debilitation/Crohn's disease/perforated left colon with peritonitis/exploratory laparotomy with drainage of intra-abdominal abscess partial colon resection with colostomy which require 3+ hours per day of interdisciplinary therapy in a comprehensive inpatient rehab setting. Physiatrist is providing close team supervision and 24 hour management of active medical problems listed below. Physiatrist and rehab team continue to assess barriers to  discharge/monitor patient progress toward functional and medical goals. FIM:          Function - Air cabin crew transfer activity did not occur: Refused        Function - Comprehension Comprehension: Auditory Comprehension assist level: Follows complex conversation/direction with no assist  Function - Expression Expression: Verbal Expression assist level: Expresses complex ideas: With extra time/assistive device  Function - Social Interaction Social Interaction assist level: Interacts appropriately with others with medication or extra time (anti-anxiety, antidepressant).  Function - Problem Solving Problem solving assist level: Solves complex 90% of the time/cues < 10% of the time  Function - Memory Memory assist level: Complete Independence: No helper Patient normally able to recall (first 3 days only): Current season, Location of own room, Staff names and faces, That he or she is in a hospital   Medical Problem List and Plan: 1. Functional deficits secondary to debilitation/Crohn's disease/perforated left colon with peritonitis/exploratory laparotomy with drainage of intra-abdominal abscess partial colon resection with colostomy. ContinuePentasa 500 mg 4 times a day 2.  DVT Prophylaxis/Anticoagulation: Subcutaneous heparin. Monitor platelet counts of any signs of bleeding 3. Pain Management: Hydrocodone as needed 4. Acute blood loss anemia. Patient transfused follow-up CBC, 7.9gm this am 5. Neuropsych: This patient is capable of making decisions on her own behalf. 6. Skin/Wound Care: Routine skin checks and dressing changes as directed with wet to dry twice a day 7. Fluids/Electrolytes/Nutrition: Strict I&O with follow-up chemistries 8. Acute renal insufficiency 2.64-2.84. Creatinine latest result 1.09. Follow-up chemistries 9. Acute respiratory failure/VDRF. Extubated 06/14/2015. 10. SVT. Monitor heart rate. Lopressor 50 mg twice a day 11. Chronic systolic  congestive heart failure. Monitor for any signs of fluid overload. Lasix 40 mg twice a day, monitor intake and creat 12. Constipation.  Has loose stool output today 13. Morbid obesity. Body mass index 67.33 14.  Mild hypokalemia - will supplementLOS (Days) 1 A FACE TO FACE EVALUATION WAS PERFORMED  Dominique [name] 06/27/2015, 7:25 AM

## 2015-06-27 NOTE — Consult Note (Addendum)
WOC follow-up: Consult requested for ostomy teaching.  Pt has been followed by [facility] during hospital stay prior to transfer to rehab.  Refer to previous progress notes.  Ostomy pouch was changed yesterday and plan to change again tomorrow with CCS PA at bedside to assess at 0800.  CCS team has been following for assessment and plan of care to abd wound and ostomy stoma.  Instructions and supplies at bedside for staff nurse use. Julien Girt MSN, RN, [place], [place], [place]

## 2015-06-27 NOTE — Progress Notes (Signed)
Social Work Assessment and Plan  Patient Details  Name: Dominique [name] MRN: [id number] Date of Birth: 05/29/1981  Today's Date: 06/27/2015  Problem List:  Patient Active Problem List   Diagnosis Date Noted  . Debilitated 06/26/2015  . Acute renal failure syndrome ([place])   . Cardiomyopathy- etiology not yet determined 06/20/2015  . Chronic systolic heart failure ([place])   . Pulmonary hypertension ([place])   . Chronic systolic CHF (congestive heart failure) ([place])   . Acute respiratory failure with hypoxia ([place])   . Crohn's disease ([place])   . Blood poisoning ([place])   . Perforated sigmoid colon ([place])   . Peritonitis ([place])   . Peritoneal abscess ([place])   . OSA on CPAP   . Obesity hypoventilation syndrome ([place])   . Acute renal insufficiency   . Acute encephalopathy   . Pressure ulcer 06/16/2015  . Protein-calorie malnutrition, severe ([place]) 06/10/2015  . Colitis 06/04/2015  . Generalized abdominal pain   . Gastritis 04/07/2015  . Iron deficiency anemia 04/07/2015  . Sinus tachycardia ([place]) 12/29/2014  . Lumbar back pain 12/06/2014  . Fluid retention 12/01/2014  . Abdominal pain   . Fall 11/20/2014  . Rectal bleeding 11/19/2014  . Diverticulitis of colon 11/17/2014  . Hepatic steatosis 11/17/2014  . Elevated LFTs 11/17/2014  . Seasonal allergies 06/13/2014  . OSA (obstructive sleep apnea) 05/02/2014  . Thyromegaly 05/02/2014  . AMENORRHEA 02/07/2010  . Morbid obesity- BMI 67.5 03/22/2009   Past Medical History:  Past Medical History  Diagnosis Date  . Asthma   . Sleep apnea   . Anemia   . Morbid obesity 03/22/2009    Qualifier: Diagnosis of  By: Ronnald Ramp CMA, [place]    . OSA (obstructive sleep apnea) 05/02/2014  . Thyromegaly 05/02/2014  . Seasonal allergies 06/13/2014  . Acute acalculous cholecystitis 11/16/2014  . Diverticulitis of colon 11/17/2014  . Hepatic steatosis 11/17/2014   Past Surgical History:  Past Surgical History  Procedure Laterality Date  . C section 2011  2011   . Flexible sigmoidoscopy N/A 06/05/2015    Procedure: FLEXIBLE SIGMOIDOSCOPY;  Surgeon: Ronald Lobo, MD;  Location: [facility];  Service: Endoscopy;  Laterality: N/A;  . Laparotomy N/A 06/09/2015    Procedure: EXPLORATORY LAPAROTOMY, DRAINAGE OF INTRAABDOMINAL ABSCESS, PARTIAL COLON RESECTION,  APPLICATION OF WOUND VAC;  Surgeon: Georganna Skeans, MD;  Location: [facility];  Service: General;  Laterality: N/A;  . Laparotomy N/A 06/11/2015    Procedure: RE-EXPLORATION OF ABDOMEN;  Surgeon: Georganna Skeans, MD;  Location: [facility];  Service: General;  Laterality: N/A;  . Colostomy N/A 06/11/2015    Procedure:  CREATION OF COLOSTOMY;  Surgeon: Georganna Skeans, MD;  Location: [facility];  Service: General;  Laterality: N/A;   Social History:  reports that she has quit smoking. Her smoking use included Cigarettes. She started smoking about 11 months ago. She has a 2 pack-year smoking history. She has never used smokeless tobacco. She reports that she does not drink alcohol or use illicit drugs.  Family / Support Systems Marital Status: Single Patient Roles: Parent, Other (Comment) (Pt has 62 y/o dtr, [place]; sister; daughter) Children: [place] - 14 y/o - attends preschool at [employer] Other Supports: Debe Anfinson - aunt - [phone];  Sanaa Zilberman - aunt - [phone]; Margaretha Sheffield - other - [phone] Anticipated Caregiver: Roselee Culver - mother - [phone] Ability/Limitations of Caregiver: Mom cannot walk very far, but pt feels she can provide 24/7 supervision and some light assistance.  Mom is living with patient  and does not work.  Mom is caring for 65 yo grandchild.  Sister comes by daily. 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 Read: Yes Write:  Yes Employment Status: Unemployed Date Retired/Disabled/Unemployed: stopped working 09/2014 - applied for disability online - CSW to check status Legal History/Current Legal Issues: none reported Guardian/Conservator: N/A - pt is able to make her own decisions per MD assessment   Abuse/Neglect Physical Abuse: Denies Verbal Abuse: Denies Sexual Abuse: Denies Exploitation of patient/patient's resources: Denies Self-Neglect: Denies  Emotional Status Pt's affect, behavior and adjustment status: Pt is trying to remain positive and patient, but she really misses her dtr and wants to get home to her as soon as possible.  Pt became a little tearful when CSW talked with her. Recent Psychosocial Issues: Pt reported that life was going as well as could be expected.  She just had regular life stressors.  She does not have support from her dtr's father.  Her family is very supportive. Psychiatric History: none reported Substance Abuse History: none reported  Patient / Family Perceptions, Expectations & Goals Pt/Family understanding of illness & functional limitations: Pt reports a good understanding of what happened to her and her condition, but she is just frustrated and impatient that this has happened to her. Premorbid pt/family roles/activities: Pt takes care of her 35 y/o. Anticipated changes in roles/activities/participation: Pt's mother is assisting with the dtr and will stay to help pt when she is d/c'd from the hospital. Pt/family expectations/goals: Pt wants to get her strength back and return home to [place] as soon as she can.  Community Resources [employer]: Other (Comment) (food stamps; Medicaid through DSS) Premorbid Home Care/DME Agencies: None Transportation available at discharge: sister Resource referrals recommended: Neuropsychology  Discharge Planning Living Arrangements: Children, Parent Support Systems: Parent, Other relatives Type of Residence: Private  residence Insurance Resources: Medicaid (specify county) Sports coach) Museum/gallery curator Resources: Family Support, Other (Comment) (financial assistance through [employer]) Financial Screen Referred: No Living Expenses: Education officer, community Management: Patient, Family Does the patient have any problems obtaining your medications?: No Home Management: Pt usually does this, but pt's mother is here to help her for a while. Patient/Family Preliminary Plans: Pt plans to return to her apartment with her mother there to assist for a while. Barriers to Discharge: Steps (9 steps to enter apartment) Social Work Anticipated Follow Up Needs: HH/OP Expected length of stay: 10-15 days  Clinical Impression CSW met with pt to introduce self and role of CSW, as well as to complete assessment.  Pt was appreciative of CSW visit and asked that CSW check on her social security disability application.  CSW will call [facility] to check.  Pt was a little tearful when we talked, but it seems like an appropriate reaction to what she is going through, especially with her missing being a hands on mother to her 43 y/o.  Pt was able to recover from her tears and stated she just needs to stay positive and she knows this will end at some point.  Pt does not have a hx of anxiety or depression, but CSW talked to her about signs/symptoms of depression/anxiety and to let CSW know if she feels her emotions are negatively impacting her recovery/therapy.  Pt stated she will.  Pt will have her mother's support when  she goes home and her sister drives her to medical appointments.  CSW encouraged pt to ask her mother come for therapy tomorrow.  CSW will continue to follow and support pt as needed.  Jenevieve Kirschbaum, Silvestre Mesi 06/27/2015, 1:06 PM

## 2015-06-27 NOTE — Evaluation (Signed)
Occupational Therapy Assessment and Plan  Patient Details  Name: Dominique [name] MRN: [id number] Date of Birth: [birth year]  OT Diagnosis: abnormal posture and muscle weakness (generalized) Rehab Potential: Rehab Potential (ACUTE ONLY): Good ELOS: 3-4 weeks   Today's Date: 06/27/2015 OT Individual Time: 0900-1030 OT Individual Time Calculation (min): 90 min     Problem List:  Patient Active Problem List   Diagnosis Date Noted  . Debilitated 06/26/2015  . Acute renal failure syndrome ([place])   . Cardiomyopathy- etiology not yet determined 06/20/2015  . Chronic systolic heart failure ([place])   . Pulmonary hypertension ([place])   . Chronic systolic CHF (congestive heart failure) ([place])   . Acute respiratory failure with hypoxia ([place])   . Crohn's disease ([place])   . Blood poisoning ([place])   . Perforated sigmoid colon ([place])   . Peritonitis ([place])   . Peritoneal abscess ([place])   . OSA on CPAP   . Obesity hypoventilation syndrome ([place])   . Acute renal insufficiency   . Acute encephalopathy   . Pressure ulcer 06/16/2015  . Protein-calorie malnutrition, severe ([place]) 06/10/2015  . Colitis 06/04/2015  . Generalized abdominal pain   . Gastritis 04/07/2015  . Iron deficiency anemia 04/07/2015  . Sinus tachycardia ([place]) 12/29/2014  . Lumbar back pain 12/06/2014  . Fluid retention 12/01/2014  . Abdominal pain   . Fall 11/20/2014  . Rectal bleeding 11/19/2014  . Diverticulitis of colon 11/17/2014  . Hepatic steatosis 11/17/2014  . Elevated LFTs 11/17/2014  . Seasonal allergies 06/13/2014  . OSA (obstructive sleep apnea) 05/02/2014  . Thyromegaly 05/02/2014  . AMENORRHEA 02/07/2010  . Morbid obesity- BMI 67.5 03/22/2009    Past Medical History:  Past Medical History  Diagnosis Date  . Asthma   . Sleep apnea   . Anemia   . Morbid obesity 03/22/2009    Qualifier: Diagnosis of  By: Ronnald Ramp CMA, [place]    . OSA (obstructive sleep apnea) 05/02/2014  . Thyromegaly 05/02/2014  . Seasonal allergies  06/13/2014  . Acute acalculous cholecystitis 11/16/2014  . Diverticulitis of colon 11/17/2014  . Hepatic steatosis 11/17/2014   Past Surgical History:  Past Surgical History  Procedure Laterality Date  . C section 2011  2011  . Flexible sigmoidoscopy N/A 06/05/2015    Procedure: FLEXIBLE SIGMOIDOSCOPY;  Surgeon: Ronald Lobo, MD;  Location: [facility];  Service: Endoscopy;  Laterality: N/A;  . Laparotomy N/A 06/09/2015    Procedure: EXPLORATORY LAPAROTOMY, DRAINAGE OF INTRAABDOMINAL ABSCESS, PARTIAL COLON RESECTION,  APPLICATION OF WOUND VAC;  Surgeon: Georganna Skeans, MD;  Location: [facility];  Service: General;  Laterality: N/A;  . Laparotomy N/A 06/11/2015    Procedure: RE-EXPLORATION OF ABDOMEN;  Surgeon: Georganna Skeans, MD;  Location: [facility];  Service: General;  Laterality: N/A;  . Colostomy N/A 06/11/2015    Procedure:  CREATION OF COLOSTOMY;  Surgeon: Georganna Skeans, MD;  Location: [facility];  Service: General;  Laterality: N/A;    Assessment & Plan Clinical Impression: Patient is a 34 y.o. year old female right handed morbidly obese female admitted 06/04/2015 with noted history of asthma, chronic systolic congestive heart failure and recent recurrent colitis since January 2016. Patient lives with family independent prior to admission. Presented with severe abdominal pain with occasional vomiting and nausea. Placed on intravenous Cipro and Flagyl. Underwent sigmoidoscopy 06/05/2015 showing severe chronic inflammatory changes in the proximal sigmoid and descending colon. Biopsies diagnosed with Crohn disease maintained on Solu-Medrol as well as mesalamine. CT angiogram and imaging revealed free intraperitoneal air/perforated viscus  likely perforated left colon from Crohn's colitis. Underwent exploratory laparotomy drainage of intra-abdominal abscess partial colon resection application of wound VAC 06/09/2015 per Dr. Grandville Silos followed by reexploration of abdomen with closure creation of colostomy  06/11/2015.. Follow-up wound care nurse for ostomy care. Dressing changes wet-to-dry every shift. Hospital course respiratory failure required intubation followed by critical care medicine. She was extubated 06/14/2015. Hospital course acute blood loss anemia 7.0 transfused with latest hemoglobin 7.8 and monitored. Acute renal insufficiency creatinine 2.64-2.84. Renal ultrasound showing no hydronephrosis with latest creatinine 1. I will 9. Bouts of SVT responding to intravenous Cardizem transitioned to by mouth Lopressor and followed by cardiology services. Echocardiogram 06/18/2015 with ejection fraction of 29% normal systolic function. Subcutaneous heparin for DVT prophylaxis. Diet slowly advanced from clear liquids to mechanical soft.   Patient transferred to CIR on 06/26/2015 .    Patient currently requires total assist +2 with basic self-care skills secondary to muscle weakness.  Prior to hospitalization, patient could complete BADL/iADL independently.  Patient will benefit from skilled intervention to increase independence with basic self-care skills prior to discharge home with care partner.  Anticipate patient will require minimal physical assistance and follow up home health.  OT - End of Session Activity Tolerance: Tolerates 10 - 20 min activity with multiple rests Endurance Deficit: Yes OT Assessment Rehab Potential (ACUTE ONLY): Good OT Patient demonstrates impairments in the following area(s): Balance;Endurance;Safety;Skin Integrity;Other (Comment) OT Basic ADL's Functional Problem(s): Grooming;Bathing;Dressing;Toileting OT Advanced ADL's Functional Problem(s): Simple Meal Preparation;Laundry;Light Housekeeping OT Transfers Functional Problem(s): Toilet;Tub/Shower OT Plan OT Intensity: Minimum of 1-2 x/day, 45 to 90 minutes OT Frequency: 5 out of 7 days OT Duration/Estimated Length of Stay: 3 weeks OT Treatment/Interventions: Functional mobility training;Self Care/advanced ADL  retraining;Therapeutic Activities;Therapeutic Exercise;Psychosocial support;Patient/family education;Discharge planning;DME/adaptive equipment instruction;Balance/vestibular training OT Self Feeding Anticipated Outcome(s): Independent OT Basic Self-Care Anticipated Outcome(s): Min Assist OT Toileting Anticipated Outcome(s): Min Assist OT Bathroom Transfers Anticipated Outcome(s): Supervision OT Recommendation Patient destination: Home Follow Up Recommendations: Other (comment);Home health OT Equipment Recommended: To be determined   Skilled Therapeutic Intervention OT 1:1 initial evaluation completed with treatment provided to focus on improved mobility, increased activity tolerance, adapted bathing/dressing skills, and goals/methods of treatment.   Pt currently +2 assist for bed mobility to rise to EOB with mod assist to maintain static sitting balance.  Pt fearful of falls while at EOB and returned to supine with +2 assist to manage bed mobility toward HOB.    Pt able to perform 50% of UB bathing but requires assist with managing breasts to wash thoroughly under skin folds.   RN aware of skin shear/tear/stretch at sternum from mass of breasts.   DME requested to support mechanically assisted transfers to bariatric reclining chair and BSC.   OT Evaluation Precautions/Restrictions  Precautions Precautions: Fall Precaution Comments: abdominal wound, colostomy Other Brace/Splint: abd binder when up Restrictions Weight Bearing Restrictions: No  General Chart Reviewed: Yes Family/Caregiver Present: No  Vital Signs Therapy Vitals Temp: 98.8 F (37.1 C) Temp Source: Oral Pulse Rate: 92 Resp: 18 BP: (!) 109/57 mmHg (RN notified) Patient Position (if appropriate): Lying Oxygen Therapy SpO2: 100 % O2 Device: Nasal Cannula O2 Flow Rate (L/min): 1 L/min  Pain Pain Assessment Pain Assessment: 0-10 Pain Score: 4  Pain Type: Surgical pain Pain Location: Abdomen Pain Orientation:  Mid Pain Descriptors / Indicators: Aching;Spasm Pain Onset: On-going Pain Intervention(s): Medication (See eMAR) Multiple Pain Sites: No  Home Living/Prior Functioning Home Living Available Help at Discharge: Family Type of Home: Apartment  Home Access: Stairs to enter Entrance Stairs-Number of Steps: 9 Entrance Stairs-Rails: Right, Left, Can reach both Home Layout: One level Bathroom Shower/Tub: Chiropodist: Standard Bathroom Accessibility: No  Lives With: Daughter, Other (Comment) (and Mother) IADL History Homemaking Responsibilities: Yes Meal Prep Responsibility: Primary Laundry Responsibility: Primary Cleaning Responsibility: Primary Bill Paying/Finance Responsibility: No Shopping Responsibility: Primary Child Care Responsibility: Primary Current License: No Education: AA degree, criminal justice Type of Occupation: Worked until Jan 2016, broken work history 2012-2015 Leisure and Hobbies: Bowling, movies, bar, going out with friends Prior Function Level of Independence: Independent with basic ADLs  Able to Take Stairs?: Yes Driving: No Vocation: Unemployed  ADL ADL ADL Comments: see Functional Assessment  Vision/Perception  Vision- History Patient Visual Report: Other (comment) (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.)   Cognition Overall Cognitive Status: Within Functional Limits for tasks assessed Arousal/Alertness: Awake/alert Orientation Level: Person;Place;Situation Person: Oriented Place: Oriented Situation: Oriented Year: 2016 Month: October Day of Week: Correct Memory: Appears intact Immediate Memory Recall: Sock;Blue;Bed Memory Recall: Sock;Blue;Bed Memory Recall Sock: Without Cue Memory Recall Blue: Without Cue Memory Recall Bed: Without Cue Attention: Selective Selective Attention: Appears intact Awareness: Appears intact Problem Solving: Impaired Problem Solving Impairment: Functional complex Safety/Judgment:  Appears intact  Sensation Sensation Light Touch: Appears Intact Stereognosis: Appears Intact Hot/Cold: Appears Intact Proprioception: Appears Intact Coordination Gross Motor Movements are Fluid and Coordinated: Yes (@ BUE) Fine Motor Movements are Fluid and Coordinated: Yes (@ BUE)  Motor  Motor Motor: Other (comment) (significant generalized weakness)  Mobility      Trunk/Postural Assessment  Cervical Assessment Cervical Assessment: Within Functional Limits Thoracic Assessment Thoracic Assessment: Exceptions to [facility] (limited due to body habitus) Lumbar Assessment Lumbar Assessment: Exceptions to [facility] (limited due to body habitus) Postural Control Postural Control: Deficits on evaluation Trunk Control: impaired due to significant generalized weakness Postural Limitations: body habitus limiting upright tolerance and mobility   Balance Balance Balance Assessed: Yes Static Sitting Balance Static Sitting - Level of Assistance: 1: +2 Total assist   Extremity/Trunk Assessment RUE Assessment RUE Assessment: Within Functional Limits LUE Assessment LUE Assessment: Within Functional Limits   See Function Navigator for Current Functional Status.   Refer to Care Plan for Long Term Goals  Recommendations for other services: None  Discharge Criteria: Patient will be discharged from OT if patient refuses treatment 3 consecutive times without medical reason, if treatment goals not met, if there is a change in medical status, if patient makes no progress towards goals or if patient is discharged from hospital.  The above assessment, treatment plan, treatment alternatives and goals were discussed and mutually agreed upon: by patient  [facility] 06/27/2015, 2:27 PM

## 2015-06-27 NOTE — Progress Notes (Signed)
Occupational Therapy Session Note  Patient Details  Name: Dominique [name] MRN: [id number] Date of Birth: 07-26-1981  Today's Date: 06/27/2015 OT Individual Time: 1515-1600 OT Individual Time Calculation (min): 45 min    Short Term Goals: Week 1:  OT Short Term Goal 1 (Week 1): Pt will complete upper body bathing seated with Mod Assist OT Short Term Goal 2 (Week 1): Pt will complete transfer from EOB to w/c with Max Assist OT Short Term Goal 3 (Week 1): Pt will complete lower body bathing supine with Mod Assist OT Short Term Goal 4 (Week 1): Pt will complete toileting with Max Assist  OT Short Term Goal 5 (Week 1): Pt will demo ability to complete HEP for BUE with supervision  Skilled Therapeutic Interventions/Progress Updates:    Upon entering the room, pt supine in bed with no c/o pain this session. Pt agreeable to OT intervention with maximal positive reinforcement and therapeutic use of self as pt is very emotional and anxious with regards to movement/transfers. Pt rolled to L and R with use of grab bars and 2 person assist to place maxi move sling underneath of pt. Pt transferred into bariatric recliner chair with total A with use of maxi move. Pt reclined in chair and quick release belt placed on pt for safety. O2 via [place] remains on pt, call bell, and all needed items within reach upon exiting the room.   Therapy Documentation Precautions:  Precautions Precautions: Fall Precaution Comments: abdominal wound, colostomy Required Braces or Orthoses: Other Brace/Splint Other Brace/Splint: abdominal binder when OOB Restrictions Weight Bearing Restrictions: No Vital Signs: Therapy Vitals Temp: 98.8 F (37.1 C) Temp Source: Oral Pulse Rate: 92 Resp: 18 BP: (!) 109/57 mmHg (RN notified) Patient Position (if appropriate): Lying Oxygen Therapy SpO2: 100 % O2 Device: Nasal Cannula O2 Flow Rate (L/min): 1 L/min ADL: ADL ADL Comments: see Functional Assessment  See Function Navigator  for Current Functional Status.   Therapy/Group: Individual Therapy  [name] 06/27/2015, 4:10 PM

## 2015-06-28 ENCOUNTER — Inpatient Hospital Stay (HOSPITAL_COMMUNITY): Payer: Medicaid Other | Admitting: Occupational Therapy

## 2015-06-28 ENCOUNTER — Inpatient Hospital Stay (HOSPITAL_COMMUNITY): Payer: Medicaid Other | Admitting: Physical Therapy

## 2015-06-28 DIAGNOSIS — E662 Morbid (severe) obesity with alveolar hypoventilation: Secondary | ICD-10-CM

## 2015-06-28 DIAGNOSIS — I5022 Chronic systolic (congestive) heart failure: Secondary | ICD-10-CM

## 2015-06-28 DIAGNOSIS — G4733 Obstructive sleep apnea (adult) (pediatric): Secondary | ICD-10-CM

## 2015-06-28 DIAGNOSIS — K50814 Crohn's disease of both small and large intestine with abscess: Secondary | ICD-10-CM

## 2015-06-28 LAB — GLUCOSE, CAPILLARY
Glucose-Capillary: 91 mg/dL (ref 65–99)
Glucose-Capillary: 92 mg/dL (ref 65–99)
Glucose-Capillary: 102 mg/dL — ABNORMAL HIGH (ref 65–99)
Glucose-Capillary: 94 mg/dL (ref 65–99)

## 2015-06-28 MED ORDER — METHOCARBAMOL 500 MG PO TABS
500.0000 mg | ORAL_TABLET | Freq: Four times a day (QID) | ORAL | Status: DC | PRN
Start: 2015-06-28 — End: 2015-07-21
  Administered 2015-06-28 – 2015-07-19 (×14): 500 mg via ORAL
  Filled 2015-06-28 (×16): qty 1

## 2015-06-28 MED ORDER — BENEPROTEIN PO POWD
1.0000 | Freq: Three times a day (TID) | ORAL | Status: DC
  Administered 2015-07-03 – 2015-07-11 (×3): 6 g via ORAL
  Filled 2015-06-28: qty 227

## 2015-06-28 MED ORDER — ALPRAZOLAM 0.25 MG PO TABS
0.2500 mg | ORAL_TABLET | Freq: Two times a day (BID) | ORAL | Status: DC
  Administered 2015-06-29 – 2015-07-16 (×35): 0.25 mg via ORAL
  Filled 2015-06-28 (×37): qty 1

## 2015-06-28 MED ORDER — SIMETHICONE 80 MG PO CHEW
160.0000 mg | CHEWABLE_TABLET | Freq: Four times a day (QID) | ORAL | Status: DC | PRN
  Administered 2015-06-28 – 2015-07-07 (×4): 160 mg via ORAL
  Filled 2015-06-28 (×5): qty 2

## 2015-06-28 NOTE — Progress Notes (Signed)
34 y.o. right handed morbidly obese female admitted 06/04/2015 with noted history of asthma, chronic systolic congestive heart failure and recent recurrent colitis since January 2016.  Subjective/Complaints: Appreciate surgery note Discussed pt with primary RN and WOC RN     Objective: Vital Signs: Blood pressure 104/53, pulse 94, temperature 98.4 F (36.9 C), temperature source Oral, resp. rate 18, SpO2 98 %. No results found. Results for orders placed or performed during the hospital encounter of 06/26/15 (from the past 72 hour(s))  CBC     Status: Abnormal   Collection Time: 06/26/15  7:55 PM  Result Value Ref Range   WBC 6.5 4.0 - 10.5 K/uL   RBC 3.43 (L) 3.87 - 5.11 MIL/uL   Hemoglobin 7.9 (L) 12.0 - 15.0 g/dL   HCT 25.9 (L) 36.0 - 46.0 %   MCV 75.5 (L) 78.0 - 100.0 fL   MCH 23.0 (L) 26.0 - 34.0 pg   MCHC 30.5 30.0 - 36.0 g/dL   RDW 28.8 (H) 11.5 - 15.5 %   Platelets 254 150 - 400 K/uL  Creatinine, serum     Status: Abnormal   Collection Time: 06/26/15  7:55 PM  Result Value Ref Range   Creatinine, Ser 1.18 (H) 0.44 - 1.00 mg/dL   GFR calc non Af Amer 59 (L) >60 mL/min   GFR calc Af Amer >60 >60 mL/min    Comment: (NOTE) The eGFR has been calculated using the CKD EPI equation. This calculation has not been validated in all clinical situations. eGFR's persistently <60 mL/min signify possible Chronic Kidney Disease.   Glucose, capillary     Status: None   Collection Time: 06/26/15  9:05 PM  Result Value Ref Range   Glucose-Capillary 84 65 - 99 mg/dL  CBC WITH DIFFERENTIAL     Status: Abnormal   Collection Time: 06/27/15  5:43 AM  Result Value Ref Range   WBC 6.5 4.0 - 10.5 K/uL    Comment: REPEATED TO VERIFY CONSISTENT WITH PREVIOUS RESULT    RBC 3.36 (L) 3.87 - 5.11 MIL/uL   Hemoglobin 7.6 (L) 12.0 - 15.0 g/dL    Comment: REPEATED TO VERIFY CONSISTENT WITH PREVIOUS RESULT    HCT 25.6 (L) 36.0 - 46.0 %   MCV 76.2 (L) 78.0 - 100.0 fL   MCH 22.6 (L) 26.0 - 34.0 pg    MCHC 29.7 (L) 30.0 - 36.0 g/dL   RDW 28.6 (H) 11.5 - 15.5 %   Platelets 258 150 - 400 K/uL    Comment: REPEATED TO VERIFY CONSISTENT WITH PREVIOUS RESULT    Neutrophils Relative % 73 %   Lymphocytes Relative 8 %   Monocytes Relative 16 %   Eosinophils Relative 3 %   Basophils Relative 0 %   Neutro Abs 4.8 1.7 - 7.7 K/uL   Lymphs Abs 0.5 (L) 0.7 - 4.0 K/uL   Monocytes Absolute 1.0 0.1 - 1.0 K/uL   Eosinophils Absolute 0.2 0.0 - 0.7 K/uL   Basophils Absolute 0.0 0.0 - 0.1 K/uL   RBC Morphology POLYCHROMASIA PRESENT     Comment: BASOPHILIC STIPPLING  Comprehensive metabolic panel     Status: Abnormal   Collection Time: 06/27/15  5:43 AM  Result Value Ref Range   Sodium 138 135 - 145 mmol/L   Potassium 3.4 (L) 3.5 - 5.1 mmol/L   Chloride 104 101 - 111 mmol/L   CO2 29 22 - 32 mmol/L   Glucose, Bld 91 65 - 99 mg/dL   BUN 21 (H)  6 - 20 mg/dL   Creatinine, Ser 1.07 (H) 0.44 - 1.00 mg/dL   Calcium 7.6 (L) 8.9 - 10.3 mg/dL   Total Protein 4.3 (L) 6.5 - 8.1 g/dL   Albumin 1.4 (L) 3.5 - 5.0 g/dL   AST 28 15 - 41 U/L   ALT 20 14 - 54 U/L   Alkaline Phosphatase 102 38 - 126 U/L   Total Bilirubin 0.6 0.3 - 1.2 mg/dL   GFR calc non Af Amer >60 >60 mL/min   GFR calc Af Amer >60 >60 mL/min    Comment: (NOTE) The eGFR has been calculated using the CKD EPI equation. This calculation has not been validated in all clinical situations. eGFR's persistently <60 mL/min signify possible Chronic Kidney Disease.    Anion gap 5 5 - 15  Glucose, capillary     Status: Abnormal   Collection Time: 06/27/15  6:41 AM  Result Value Ref Range   Glucose-Capillary 109 (H) 65 - 99 mg/dL  Glucose, capillary     Status: Abnormal   Collection Time: 06/27/15 11:47 AM  Result Value Ref Range   Glucose-Capillary 123 (H) 65 - 99 mg/dL  Glucose, capillary     Status: Abnormal   Collection Time: 06/27/15  4:18 PM  Result Value Ref Range   Glucose-Capillary 119 (H) 65 - 99 mg/dL  Glucose, capillary      Status: Abnormal   Collection Time: 06/27/15  8:47 PM  Result Value Ref Range   Glucose-Capillary 108 (H) 65 - 99 mg/dL  Glucose, capillary     Status: None   Collection Time: 06/28/15  6:57 AM  Result Value Ref Range   Glucose-Capillary 91 65 - 99 mg/dL     HEENT: light sensitive Right eye Cardio: RRR and no murmur Resp: CTA B/L and unlabored GI: BS positive and LLQ colostomy, midline open abd wound with Wet to Dry , small amt serosamguinous drainage on dressing Extremity:  No Edema Skin:   Wound see above, no heel ulceration Neuro: Alert/Oriented and Abnormal Motor 5/5 in BUE, 3- bilateral HF, 4- Knee ext and ankle DF/PF Musc/Skel:  Other no pain with UE and LE ROM Gen NAD   Assessment/Plan: 1. Functional deficits secondary to debilitation/Crohn's disease/perforated left colon with peritonitis/exploratory laparotomy with drainage of intra-abdominal abscess partial colon resection with colostomy which require 3+ hours per day of interdisciplinary therapy in a comprehensive inpatient rehab setting. Physiatrist is providing close team supervision and 24 hour management of active medical problems listed below. Physiatrist and rehab team continue to assess barriers to discharge/monitor patient progress toward functional and medical goals. Team conference today please see physician documentation under team conference tab, met with team face-to-face to discuss problems,progress, and goals. Formulized individual treatment plan based on medical history, underlying problem and comorbidities. FIM: Function - Bathing Position: Bed Body parts bathed by patient: Right arm, Left arm Body parts bathed by helper: Right lower leg, Left lower leg, Left upper leg, Right upper leg, Front perineal area, Chest, Abdomen Bathing not applicable: Buttocks, Back Assist Level: 2 helpers  Function- Upper Body Dressing/Undressing What is the patient wearing?: Hospital gown Function - Lower Body  Dressing/Undressing What is the patient wearing?: [place] steps completed by patient: Adjust clothing prior to toileting, Performs perineal hygiene, Adjust clothing after toileting Assist level: More than reasonable time, Two helpers  Function - Air cabin crew transfer activity did not occur: Refused  Function - Chair/bed transfer Chair/bed transfer activity did  not occur: Safety/medical concerns Chair/bed transfer assist level: dependent (Pt equals 0%) Chair/bed transfer assistive device: Mechanical lift Mechanical lift: Maximove  Function - Locomotion: Wheelchair Will patient use wheelchair at discharge?: Yes Type: Manual Wheelchair activity did not occur: Safety/medical concerns (debility and awaiting appropriate equipment) Function - Locomotion: Ambulation Ambulation activity did not occur: Safety/medical concerns (significant debility)  Function - Comprehension Comprehension: Auditory Comprehension assist level: Follows basic conversation/direction with extra time/assistive device  Function - Expression Expression: Verbal Expression assist level: Expresses basic needs/ideas: With extra time/assistive device  Function - Social Interaction Social Interaction assist level: Interacts appropriately with others - No medications needed.  Function - Problem Solving Problem solving assist level: Solves basic 90% of the time/requires cueing < 10% of the time  Function - Memory Memory assist level: Complete Independence: No helper Patient normally able to recall (first 3 days only): Current season, Location of own room, Staff names and faces, That he or she is in a hospital   Medical Problem List and Plan: 1. Functional deficits secondary to debilitation/Crohn's disease/perforated left colon with peritonitis/exploratory laparotomy with drainage of intra-abdominal abscess partial colon resection with colostomy. Continue Pentasa 500 mg  4 times a day 2.  DVT Prophylaxis/Anticoagulation: Subcutaneous heparin. Monitor platelet counts of any signs of bleeding 3. Pain Management: Hydrocodone as needed 4. Acute blood loss anemia. Patient transfused follow-up CBC, 7.9gm this am 5. Neuropsych: This patient is capable of making decisions on her own behalf. 6. Skin/Wound Care: dehiscence noted at muco cutaneous jct of stoma- CCS and WOC RN following 7. Fluids/Electrolytes/Nutrition: Strict I&O with follow-up chemistries 8. Acute renal insufficiency 2.64-2.84. Creatinine latest result 1.09. Follow-up chemistries stable 9. Acute respiratory failure/VDRF. Extubated 06/14/2015. 10. SVT. Monitor heart rate. Lopressor 50 mg twice a day 11. Chronic systolic congestive heart failure. Monitor for any signs of fluid overload. Lasix 40 mg twice a day, monitor intake and creat  13. Morbid obesity. Body mass index 67.33 14.  Mild hypokalemia - will supplement  15.  Severe hypoalbuminemia- supplement   LOS (Days) 2 A FACE TO FACE EVALUATION WAS PERFORMED  KIRSTEINS,ANDREW E 06/28/2015, 8:27 AM

## 2015-06-28 NOTE — Progress Notes (Signed)
Occupational Therapy Session Note  Patient Details  Name: Dominique [name] MRN: [id number] Date of Birth: 1981/05/19  Today's Date: 06/28/2015 OT Individual Time: 1300-1400 OT Individual Time Calculation (min): 60 min    Short Term Goals: Week 1:  OT Short Term Goal 1 (Week 1): Pt will complete upper body bathing seated with Mod Assist OT Short Term Goal 2 (Week 1): Pt will complete transfer from EOB to w/c with Max Assist OT Short Term Goal 3 (Week 1): Pt will complete lower body bathing supine with Mod Assist OT Short Term Goal 4 (Week 1): Pt will complete toileting with Max Assist  OT Short Term Goal 5 (Week 1): Pt will demo ability to complete HEP for BUE with supervision  Skilled Therapeutic Interventions/Progress Updates:    1:1 Pt tearful about LOS today when arrived. Discussed goals and current progress and problem solving a plan to achieve those goals.  Transitioned down to the gym in the [place] chair. Transition from chair to mat via maxi sky lift.  [facility] remained in the maxi sky lift (with slack in sling) focused on sitting balance with bilateral feet firmly supported on the floor, and making small weight adjustments to maintain a forward weight shift to maintain sitting balance at midline. Pt able to tolerate sitting with bilateral hands supported in therapist's hands or on arm rests of chair therapist was sitting in for 25 min. With slack in lift pt demonstrating sitting balance with min A Pt reported need to urinate.  Used maxi sky to transition to drop arm commode. Pt with increased fear of falling sitting on BSC requiring more physical comfort for support and encouragement.  Pt unable to void and required mod A for forward weight shift in BSC.  Transitioned back to bari chair via maxi sky and then back to bed with maxi move.  Pt with difficulty rolling in bed due to her body habitus to remove lift pads requiring total A +2.   Therapy Documentation Precautions:   Precautions Precautions: Fall Precaution Comments: abdominal wound, colostomy Required Braces or Orthoses: Other Brace/Splint Other Brace/Splint: abdominal binder when OOB Restrictions Weight Bearing Restrictions: No Pain:  soreness in abdominal region ADL: ADL ADL Comments: see Functional Assessment  See Function Navigator for Current Functional Status.   Therapy/Group: Individual Therapy  Willeen Cass [facility] 06/28/2015, 3:50 PM

## 2015-06-28 NOTE — Progress Notes (Signed)
Social Work Patient ID: Dominique [name], female   DOB: 04-20-1981, 34 y.o.   MRN: [id number]   CSW met with pt to update her on team conference discussion.  Pt was tearful when CSW gave her the d/c date that is 3 weeks away.  She so badly wants to get home to her dtr.  CSW encouraged pt that she needs to use that as motivation in her therapy so that she can rehabilitate quicker.  Pt agreed.  CSW told pt that neuropsychologist would be coming to see her so that she can receive more support and maybe some strategies to help her stay more calm in therapies, thus getting the most out of the time she has.  CSW will also bring pt a journal and look for a funny book pt can read in between therapies.  She was appreciative.  Her mother could not attend therapies, but she hopes she will be able to come another day this week.  CSW to talk with pt's mother at that time.  CSW will continue to follow and assist as needed.

## 2015-06-28 NOTE — Consult Note (Addendum)
WOC follow-up: Previous deep tissue injury to nare resolved before transfer to rehab.  Intact skin to nose. Ostomy pouching: Current pouch is leaking. Applied 2 piece pouch with barrier ring. Stoma is pale and tan with 90% slough, slightly above skin level, 1 3/4 inches, mucutaneous separation occurring to all peristomal edges with mod amt thick tan pus and some odor. Full thickness wound to edge is 3.5X2cm CCS assessed stoma and discussed plan of care. It will be difficult to maintain pouch seal for more than 24 hours due to drainage behind the wafer. No stool in pouch, mod amt tan pus.  Pt states there was stool yesterday. Education provided: Demonstrated pouch change using 2 piece pouch and a barrier ring to attempt to maintain seal. Applied narrow strip of Aquacel packing into separation edges of stoma. Enrolled patient in [place] program: No Ostomy supplies ordered to the room for bedside nurse use. Pt watched procedure and asked appropriate questions.  She will need total assistance with pouching upon discharge until this complex problem is resolved.  Pt has partial thickness fissures underneath breast skin folds; appearance consistent with intertrigo.  Interdry silver-impregnated fabric to wick drainage away from skin and provide antimicrobial benefits.  Wound type: Full thickness midline abd wound, CCS at bedside to assess wound.  They have ordered moist gauze packing TID by bedside nurses.  Mod amt thick tan drainage, no odor, wound 90% red, 10% yellow. Julien Girt MSN, RN, [place], [place], [place]

## 2015-06-28 NOTE — Patient Care Conference (Signed)
Inpatient RehabilitationTeam Conference and Plan of Care Update Date: 06/28/2015   Time: 11:30 AM    Patient Name: Dominique [name]      Medical Record Number: [id number]  Date of Birth: [birth year] Sex: Female         Room/Bed: 4M10C/4M10C-01 Payor Info: Payor: MEDICAID [place] / Plan: MEDICAID [place] ACCESS / Product Type: *No Product type* /    Admitting Diagnosis: DEBILITY  Admit Date/Time:  06/26/2015  5:49 PM Admission Comments: No comment available   Primary Diagnosis:  Debilitated Principal Problem: Debilitated  Patient Active Problem List   Diagnosis Date Noted  . Debilitated 06/26/2015  . Acute renal failure syndrome ([place])   . Cardiomyopathy- etiology not yet determined 06/20/2015  . Chronic systolic heart failure ([place])   . Pulmonary hypertension ([place])   . Chronic systolic CHF (congestive heart failure) ([place])   . Acute respiratory failure with hypoxia ([place])   . Crohn's disease ([place])   . Blood poisoning ([place])   . Perforated sigmoid colon ([place])   . Peritonitis ([place])   . Peritoneal abscess ([place])   . OSA on CPAP   . Obesity hypoventilation syndrome ([place])   . Acute renal insufficiency   . Acute encephalopathy   . Pressure ulcer 06/16/2015  . Protein-calorie malnutrition, severe ([place]) 06/10/2015  . Colitis 06/04/2015  . Generalized abdominal pain   . Gastritis 04/07/2015  . Iron deficiency anemia 04/07/2015  . Sinus tachycardia ([place]) 12/29/2014  . Lumbar back pain 12/06/2014  . Fluid retention 12/01/2014  . Abdominal pain   . Fall 11/20/2014  . Rectal bleeding 11/19/2014  . Diverticulitis of colon 11/17/2014  . Hepatic steatosis 11/17/2014  . Elevated LFTs 11/17/2014  . Seasonal allergies 06/13/2014  . OSA (obstructive sleep apnea) 05/02/2014  . Thyromegaly 05/02/2014  . AMENORRHEA 02/07/2010  . Morbid obesity- BMI 67.5 03/22/2009    Expected Discharge Date:    Team Members Present:       Current Status/Progress Goal Weekly Team Focus  Medical   wound dehis at  colostomy site  maintain medical stability during rehab  Wound care with assist of CCS and WOC   Bowel/Bladder   leaking colostomy-staff manage, staff holding urinal for voiding  patient manage colostomy, patient able to use toilet/BSC  asess patient's ability and provide education and allow practice   Swallow/Nutrition/ Hydration     na        ADL's   Total assist, +2 for BADL and transfers  Supervision for transfers and iADL, Overall Min Assist for BADL  Improved activity tolearnce, static/dynamic sitting balance, DME/AE training   Mobility   +2 assist maxi-move for transfers; +2 assist bed mobility with HOB mechanical assist  Supervision  sitting eob tolerance, introduction of tilt table for B LE WB, progressive LE and UE exercises   Communication     na        Safety/Cognition/ Behavioral Observations    no unsafe behaviors        Pain   Hydrocodone 2tablets Q 6 prn.   PRN pain med  assess effectivenss and need for pain   Skin   staff performing dressiing changes   patient will be able to manage colostomy and dressing changes  practice and education    Rehab Goals Patient on target to meet rehab goals: Yes Rehab Goals Revised: none - pt's first conference *See Care Plan and progress notes for long and short-term goals.  Barriers to Discharge: Morbid obesity, multiple medical    Possible  Resolutions to Barriers:  see above    Discharge Planning/Teaching Needs:  Pt will return to her apartment at d/c where her mother will be with her to provide 24/7 supervision and some minimal assistance.  wound and skin care   Team Discussion:  Pt has had recurrent bouts of colitis since January 2016, leading up to this admission.  Now with colostomy and surgery and wound care continue to follow pt for wound.  Pt is very nice, but is extremely anxious and fearful of falling with therapists.  Her heartrate went to 122 at the edge of the bed due to anxiety.  Pt needs a lot of encouragement.   Team is only using the maxi move at this time.  Goals are set at supervision overall.  Revisions to Treatment Plan:  none   Continued Need for Acute Rehabilitation Level of Care: The patient requires daily medical management by a physician with specialized training in physical medicine and rehabilitation for the following conditions: Daily direction of a multidisciplinary physical rehabilitation program to ensure safe treatment while eliciting the highest outcome that is of practical value to the patient.: Yes Daily medical management of patient stability for increased activity during participation in an intensive rehabilitation regime.: Yes Daily analysis of laboratory values and/or radiology reports with any subsequent need for medication adjustment of medical intervention for : Other;Post surgical problems  Casondra Gasca, [place] 06/29/2015, 3:13 PM

## 2015-06-28 NOTE — Progress Notes (Signed)
Occupational Therapy Session Note  Patient Details  Name: Dominique [name] MRN: [id number] Date of Birth: 02-25-1981  Today's Date: 06/28/2015 OT Individual Time: 0930-1030 OT Individual Time Calculation (min): 60 min    Short Term Goals: Week 1:  OT Short Term Goal 1 (Week 1): Pt will complete upper body bathing seated with Mod Assist OT Short Term Goal 2 (Week 1): Pt will complete transfer from EOB to w/c with Max Assist OT Short Term Goal 3 (Week 1): Pt will complete lower body bathing supine with Mod Assist OT Short Term Goal 4 (Week 1): Pt will complete toileting with Max Assist  OT Short Term Goal 5 (Week 1): Pt will demo ability to complete HEP for BUE with supervision  Skilled Therapeutic Interventions/Progress Updates:    Pt seen this session to facilitate bed mobility and limb ROM during self care. Pt is only able to wash her arms and part of her chest due to limited AROM and UE strength.  Therapist assisted with other body parts. No clothing available. The rest of the session focused on BUE AROM with overhead arm reaching, reaching toward knees with knees bent, reaching across body with HOB slightly elevated.  With knees bent, held in place by therapist and tech pt worked on lateral knee sways, pushing feet into bed to flex glutes, rolling.  Pt is able to do a very small percentage of bed mobility.  Mid sized physioball used under foot for assist with active knee flex/ ext and active hip abd/add. Repeated with B legs. Maximove sling placed under pt to prepare her for PT session next. Pt resting in bed with all needs met stating she felt that she had really been worked out.  Therapy Documentation Precautions:  Precautions Precautions: Fall Precaution Comments: abdominal wound, colostomy Required Braces or Orthoses: Other Brace/Splint Other Brace/Splint: abdominal binder when OOB Restrictions Weight Bearing Restrictions: No  Pain: Pain Assessment Pain Assessment: 0-10 Pain Score:  8  Pain Type: Surgical pain;Acute pain Pain Location: Abdomen Pain Orientation: Mid Pain Descriptors / Indicators: Sharp Pain Onset: Sudden Pain Intervention(s): Rest;Emotional support Multiple Pain Sites: No ADL: ADL ADL Comments: see Functional Assessment  See Function Navigator for Current Functional Status.   Therapy/Group: Individual Therapy  [place] 06/28/2015, 1:00 PM

## 2015-06-28 NOTE — Progress Notes (Signed)
Patient ID: Dominique [name], female   DOB: 05/19/1981, 34 y.o.   MRN: [id number]     [facility]      [address] [place], [place] [address]    Phone: [phone] FAX: [phone]     Subjective: Tolerating POs.  Ostomy is being changed frequently because of leaking.  Objective:  Vital signs:  Filed Vitals:   06/27/15 0434 06/27/15 1330 06/27/15 2100 06/28/15 0456  BP: 116/58 109/57 108/65 104/53  Pulse: 94 92 98 94  Temp: 98.9 F (37.2 C) 98.8 F (37.1 C)  98.4 F (36.9 C)  TempSrc: Oral Oral  Oral  Resp: 18 18  18   SpO2: 97% 100%  98%    Last BM Date: 06/26/15  Intake/Output   Yesterday:  10/04 0701 - 10/05 0700 In: 380 [P.O.:340; I.V.:40] Out: 1400 [Urine:1400] This shift:  Total I/O In: -  Out: 200 [Urine:200]    Physical Exam: General: Pt awake/alert/oriented x4 in no acute distress Abdomen: +bs, abdomen is soft, tender over large abdominal wound, which has dehisced, but granulating and no evisceration of bowel.  There is pooling of yellow drainage and fibrinous exudate at the base of the wound which I debrided.  The llq stoma looks better with stoma areas that are pink which is improved from previously all necrotic tissue.  I debrided some tissue that was hanging off loosely.  It is foul smelling from the necrosis, separated from the skin.     Problem List:   Principal Problem:   Debilitated Active Problems:   Morbid obesity- BMI 67.5   OSA (obstructive sleep apnea)   Crohn's disease (HCC)   Chronic systolic CHF (congestive heart failure) ([place])    Results:   Labs: Results for orders placed or performed during the hospital encounter of 06/26/15 (from the past 48 hour(s))  CBC     Status: Abnormal   Collection Time: 06/26/15  7:55 PM  Result Value Ref Range   WBC 6.5 4.0 - 10.5 K/uL   RBC 3.43 (L) 3.87 - 5.11 MIL/uL   Hemoglobin 7.9 (L) 12.0 - 15.0 g/dL   HCT 25.9 (L) 36.0 - 46.0 %   MCV 75.5 (L)  78.0 - 100.0 fL   MCH 23.0 (L) 26.0 - 34.0 pg   MCHC 30.5 30.0 - 36.0 g/dL   RDW 28.8 (H) 11.5 - 15.5 %   Platelets 254 150 - 400 K/uL  Creatinine, serum     Status: Abnormal   Collection Time: 06/26/15  7:55 PM  Result Value Ref Range   Creatinine, Ser 1.18 (H) 0.44 - 1.00 mg/dL   GFR calc non Af Amer 59 (L) >60 mL/min   GFR calc Af Amer >60 >60 mL/min    Comment: (NOTE) The eGFR has been calculated using the CKD EPI equation. This calculation has not been validated in all clinical situations. eGFR's persistently <60 mL/min signify possible Chronic Kidney Disease.   Glucose, capillary     Status: None   Collection Time: 06/26/15  9:05 PM  Result Value Ref Range   Glucose-Capillary 84 65 - 99 mg/dL  CBC WITH DIFFERENTIAL     Status: Abnormal   Collection Time: 06/27/15  5:43 AM  Result Value Ref Range   WBC 6.5 4.0 - 10.5 K/uL    Comment: REPEATED TO VERIFY CONSISTENT WITH PREVIOUS RESULT    RBC 3.36 (L) 3.87 - 5.11 MIL/uL   Hemoglobin 7.6 (L) 12.0 - 15.0 g/dL  Comment: REPEATED TO VERIFY CONSISTENT WITH PREVIOUS RESULT    HCT 25.6 (L) 36.0 - 46.0 %   MCV 76.2 (L) 78.0 - 100.0 fL   MCH 22.6 (L) 26.0 - 34.0 pg   MCHC 29.7 (L) 30.0 - 36.0 g/dL   RDW 28.6 (H) 11.5 - 15.5 %   Platelets 258 150 - 400 K/uL    Comment: REPEATED TO VERIFY CONSISTENT WITH PREVIOUS RESULT    Neutrophils Relative % 73 %   Lymphocytes Relative 8 %   Monocytes Relative 16 %   Eosinophils Relative 3 %   Basophils Relative 0 %   Neutro Abs 4.8 1.7 - 7.7 K/uL   Lymphs Abs 0.5 (L) 0.7 - 4.0 K/uL   Monocytes Absolute 1.0 0.1 - 1.0 K/uL   Eosinophils Absolute 0.2 0.0 - 0.7 K/uL   Basophils Absolute 0.0 0.0 - 0.1 K/uL   RBC Morphology POLYCHROMASIA PRESENT     Comment: BASOPHILIC STIPPLING  Comprehensive metabolic panel     Status: Abnormal   Collection Time: 06/27/15  5:43 AM  Result Value Ref Range   Sodium 138 135 - 145 mmol/L   Potassium 3.4 (L) 3.5 - 5.1 mmol/L   Chloride 104 101 - 111  mmol/L   CO2 29 22 - 32 mmol/L   Glucose, Bld 91 65 - 99 mg/dL   BUN 21 (H) 6 - 20 mg/dL   Creatinine, Ser 1.07 (H) 0.44 - 1.00 mg/dL   Calcium 7.6 (L) 8.9 - 10.3 mg/dL   Total Protein 4.3 (L) 6.5 - 8.1 g/dL   Albumin 1.4 (L) 3.5 - 5.0 g/dL   AST 28 15 - 41 U/L   ALT 20 14 - 54 U/L   Alkaline Phosphatase 102 38 - 126 U/L   Total Bilirubin 0.6 0.3 - 1.2 mg/dL   GFR calc non Af Amer >60 >60 mL/min   GFR calc Af Amer >60 >60 mL/min    Comment: (NOTE) The eGFR has been calculated using the CKD EPI equation. This calculation has not been validated in all clinical situations. eGFR's persistently <60 mL/min signify possible Chronic Kidney Disease.    Anion gap 5 5 - 15  Glucose, capillary     Status: Abnormal   Collection Time: 06/27/15  6:41 AM  Result Value Ref Range   Glucose-Capillary 109 (H) 65 - 99 mg/dL  Glucose, capillary     Status: Abnormal   Collection Time: 06/27/15 11:47 AM  Result Value Ref Range   Glucose-Capillary 123 (H) 65 - 99 mg/dL  Glucose, capillary     Status: Abnormal   Collection Time: 06/27/15  4:18 PM  Result Value Ref Range   Glucose-Capillary 119 (H) 65 - 99 mg/dL  Glucose, capillary     Status: Abnormal   Collection Time: 06/27/15  8:47 PM  Result Value Ref Range   Glucose-Capillary 108 (H) 65 - 99 mg/dL  Glucose, capillary     Status: None   Collection Time: 06/28/15  6:57 AM  Result Value Ref Range   Glucose-Capillary 91 65 - 99 mg/dL    Imaging / Studies: No results found.  Medications / Allergies:  Scheduled Meds: . feeding supplement (ENSURE ENLIVE)  237 mL Oral TID BM  . furosemide  40 mg Oral BID  . heparin  5,000 Units Subcutaneous 3 times per day  . insulin aspart  0-15 Units Subcutaneous TID WC  . metoCLOPramide  5 mg Oral TID AC  . metoprolol tartrate  50 mg Oral BID  .  pantoprazole  40 mg Oral Daily  . polyethylene glycol  17 g Oral Daily  . potassium chloride  20 mEq Oral Daily  . protein supplement  1 scoop Oral TID WC  .  saccharomyces boulardii  250 mg Oral BID   Continuous Infusions:  PRN Meds:.guaiFENesin, HYDROcodone-acetaminophen, ondansetron **OR** ondansetron (ZOFRAN) IV, sodium chloride, sorbitol  Antibiotics: Anti-infectives    None       Assessment/Plan Perforated descending colon, colitis POD 17,19 s/p ex lap with DRAINAGE OF INTRA-ABDOMINAL ABSCESS, PARTIAL COLON RESECTION, re-exploration and creation of colostomy and abdominal closure---Dr. Lanier Clam - Tolerating soft diet - increase to QID wet to dry dressing changes to help clean up the dehisced wound.  mepitel to base of the wound -watch colostomy, viable, but has mucocutaneous separation  -miralax -up with therapies -abd binder -will continue to follow wound/ostomy(MWF).  Please call in the interim if needed.    Erby Pian, [facility] Pager [phone]) For consults and floor pages call [phone](7A-4:[phone])  06/28/2015 8:44 AM

## 2015-06-28 NOTE — Progress Notes (Addendum)
Physical Therapy Session Note  Patient Details  Name: Dominique [name] MRN: [id number] Date of Birth: 1981-02-23  Today's Date: 06/28/2015 PT Individual Time: 1030-1130 PT Individual Time Calculation (min): 60 min   Short Term Goals: Week 1:  PT Short Term Goal 1 (Week 1): Pt will be able to tolerate OOB in recliner x 4 hours a day to promote upright tolerance PT Short Term Goal 2 (Week 1): Pt will be able to perform sitting balance activities x 10 min with mod A PT Short Term Goal 3 (Week 1): Pt will be able to initiate weightbearing through BLE with lift equipment  Skilled Therapeutic Interventions/Progress Updates:    Pt received in bed, agreeable to PT session, requesting to try sitting EOB to pt's R side of bed - afterward, she reports this was easier than L side of bed. Pt tolerates sitting eob x 5 minutes with min A of 1-2 persons due to anxiety and trunk retropulsion. Therapeutic Activity: See function tab for bed mobility and transfer information. Pt verbalizes significant anxiety re: fear of falling once eob. Therapeutic Exercise - PT instructs pt in UE and LE exercises once in electric shuttle chair: 2 x 10 reps each: using arms on armrests to try to pull trunk forward with AAROM from PT, hip adduction, and isometric hip/knee extension into footplates 5 seconds on/off. Pt ended with all needs in reach, shuttle chair with slight dump for improved sitting stability, and pt reporting comfort. Continue per PT POC.   Therapy Documentation Precautions:  Precautions Precautions: Fall Precaution Comments: abdominal wound, colostomy Required Braces or Orthoses: Other Brace/Splint Other Brace/Splint: abdominal binder when OOB Restrictions Weight Bearing Restrictions: No Vital Signs: On EOB, SpO2 measured to be 98% on RA and HR at 122 bpm - pt verbalizes fear of falling backwards.  Pain: Pain Assessment Pain Assessment: 0-10 Pain Score: 8  Pain Type: Surgical pain;Acute pain Pain Location:  Abdomen Pain Orientation: Mid Pain Descriptors / Indicators: Sharp Pain Onset: Sudden Pain Intervention(s): Rest;Emotional support Multiple Pain Sites: No   See Function Navigator for Current Functional Status.   Therapy/Group: Individual Therapy with Olivia Mackie, PT Tech as +2  [facility] M 06/28/2015, 11:27 AM

## 2015-06-28 NOTE — Progress Notes (Signed)
Occupational Therapy Session Note  Patient Details  Name: Dominique [name] MRN: [id number] Date of Birth: 1981/02/27  Today's Date: 06/28/2015 OT Individual Time: 1430-1500 OT Individual Time Calculation (min): 30 min    Short Term Goals: Week 1:  OT Short Term Goal 1 (Week 1): Pt will complete upper body bathing seated with Mod Assist OT Short Term Goal 2 (Week 1): Pt will complete transfer from EOB to w/c with Max Assist OT Short Term Goal 3 (Week 1): Pt will complete lower body bathing supine with Mod Assist OT Short Term Goal 4 (Week 1): Pt will complete toileting with Max Assist  OT Short Term Goal 5 (Week 1): Pt will demo ability to complete HEP for BUE with supervision  Skilled Therapeutic Interventions/Progress Updates:     Pt seen for OT therapy session focusing on ADL re-training and UE strengthening. Pt in supine upon arrival with RN and NT present. She voiced need for toileting task. +2 assist required to manage B LEs in order to position female urinal. Encouraged pt to complete perineal hygiene with set-up, however, due to body habitus, pt unable to reach- total A required for toileting task.   In supine, pt completed UE strengthening exercises using level II (orange) theraband. Pt performed 2 sets of 10 reps of theaband exercises to all major UE muscle groups with verbal and tactile cues provided for proper form and technique. Pt educated regarding importance/ functional implications for strong UEs, benefits of mobility, therapy progression, LE supine exercises, and OT goals. Pt left in supine at end of session, all needs in reach.   Therapy Documentation Precautions:  Precautions Precautions: Fall Precaution Comments: abdominal wound, colostomy Required Braces or Orthoses: Other Brace/Splint Other Brace/Splint: abdominal binder when OOB Restrictions Weight Bearing Restrictions: No Pain:   No/ denies pain ADL: ADL ADL Comments: see Functional Assessment  See Function  Navigator for Current Functional Status.   Therapy/Group: Individual Therapy  [place], Angelica Frandsen C 06/28/2015, 7:26 AM

## 2015-06-29 ENCOUNTER — Inpatient Hospital Stay (HOSPITAL_COMMUNITY): Payer: Medicaid Other | Admitting: Occupational Therapy

## 2015-06-29 ENCOUNTER — Inpatient Hospital Stay (HOSPITAL_COMMUNITY): Payer: Medicaid Other

## 2015-06-29 ENCOUNTER — Encounter (HOSPITAL_COMMUNITY): Payer: Medicaid Other

## 2015-06-29 DIAGNOSIS — F4323 Adjustment disorder with mixed anxiety and depressed mood: Secondary | ICD-10-CM

## 2015-06-29 LAB — GLUCOSE, CAPILLARY
GLUCOSE-CAPILLARY: 102 mg/dL — AB (ref 65–99)
Glucose-Capillary: 113 mg/dL — ABNORMAL HIGH (ref 65–99)
Glucose-Capillary: 97 mg/dL (ref 65–99)
Glucose-Capillary: 104 mg/dL — ABNORMAL HIGH (ref 65–99)

## 2015-06-29 NOTE — IPOC Note (Addendum)
Overall Plan of Care [facility]) Patient Details Name: Dominique [name] MRN: [id number] DOB: 01-12-1981  Admitting Diagnosis: [facility] Problems: Principal Problem:   Debilitated Active Problems:   Morbid obesity- BMI 67.5   OSA (obstructive sleep apnea)   Crohn's disease (HCC)   Chronic systolic CHF (congestive heart failure) ([place])     Functional Problem List: Nursing Behavior, Bladder, Bowel, Edema, Endurance, Medication Management, Nutrition, Pain, Safety, Skin Integrity  PT Balance, Endurance, Motor, Skin Integrity, Pain  OT Balance, Endurance, Safety, Skin Integrity, Other (Comment)  SLP    TR Activity tolerance, functional mobility, balance, safety, pain, anxiety/stress, skin integrity       Basic ADL's: OT Grooming, Bathing, Dressing, Toileting  Community    Advanced  ADL's: OT Simple Meal Preparation, Laundry, Light Housekeeping     Transfers: PT Bed Mobility, Bed to Chair, Musician, Manufacturing systems engineer, Metallurgist: PT Ambulation, Data processing manager, Emergency planning/management officer     Additional Impairments: OT    SLP        TR  community skills    Anticipated Outcomes Item Anticipated Outcome  Self Feeding Independent  Garment/textile technologist Transfers Supervision  Bowel/Bladder  Mod assist  Transfers  S basic transfers; mod A car transfers  Locomotion  S short distance/household gait; mod I w/c propulsion; min A stairs  Communication     Cognition     Pain  <4 on a 0-10 scale  Safety/Judgment  mod assist   Therapy Plan: PT Intensity: Minimum of 1-2 x/day ,45 to 90 minutes PT Frequency: 5 out of 7 days PT Duration Estimated Length of Stay: 3-4 weeks OT Intensity: Minimum of 1-2 x/day, 45 to 90 minutes OT Frequency: 5 out of 7 days OT Duration/Estimated Length of Stay: 3-4 weeks   TR Duration/ELOS:  3 weeks TR Frequency:  Min 1 time per week >20 minutes        Team  Interventions: Nursing Interventions Patient/Family Education, Bladder Management, Bowel Management, Disease Management/Prevention, Pain Management, Medication Management, Skin Care/Wound Management, Discharge Planning, Psychosocial Support  PT interventions Ambulation/gait training, Training and development officer, Community reintegration, Discharge planning, Disease management/prevention, DME/adaptive equipment instruction, Functional mobility training, Neuromuscular re-education, Pain management, Patient/family education, Psychosocial support, Skin care/wound management, Splinting/orthotics, Stair training, Therapeutic Activities, Therapeutic Exercise, UE/LE Strength taining/ROM, UE/LE Coordination activities, Wheelchair propulsion/positioning  OT Interventions Functional mobility training, Self Care/advanced ADL retraining, Therapeutic Activities, Therapeutic Exercise, Psychosocial support, Patient/family education, Discharge planning, DME/adaptive equipment instruction, Training and development officer  SLP Interventions    TR Interventions   Recreation/leisure participation, Balance/Vestibular training, functional mobility, therapeutic activities, UE/LE strength/coordination, w/c mobility, community reintegration, pt/family education, adaptive equipment instruction/use, discharge planning, psychosocial support  SW/CM Interventions Discharge Planning, Barrister's clerk, Patient/Family Education    Team Discharge Planning: Destination: PT-Home ,OT- Home , SLP-  Projected Follow-up: PT-Home health PT, 24 hour supervision/assistance, OT-  Other (comment), Home health OT, SLP-  Projected Equipment Needs: PT-Rolling walker with 5" wheels, OT- To be determined, SLP-  Equipment Details: PT- , OT-  Patient/family involved in discharge planning: PT- Patient,  OT-Patient, SLP-   MD ELOS: 3-4 wk Medical Rehab Prognosis:  Good Assessment: 34 y.o. right handed morbidly obese female admitted 06/04/2015 with noted  history of asthma, chronic systolic congestive heart failure and recent recurrent colitis since January 2016.   Now requiring 24/7 Rehab RN,MD, as well as CIR level PT, OT .  Treatment team  will focus on ADLs and mobility with goals set at [facility]  See Team Conference Notes for weekly updates to the plan of care

## 2015-06-29 NOTE — Progress Notes (Signed)
Occupational Therapy Session Note  Patient Details  Name: Dominique [name] MRN: [id number] Date of Birth: [birth year]  Today's Date: 06/29/2015 OT Individual Time: 0800-0900 OT Individual Time Calculation (min): 60 min    Short Term Goals: Week 1:  OT Short Term Goal 1 (Week 1): Pt will complete upper body bathing seated with Mod Assist OT Short Term Goal 2 (Week 1): Pt will complete transfer from EOB to w/c with Max Assist OT Short Term Goal 3 (Week 1): Pt will complete lower body bathing supine with Mod Assist OT Short Term Goal 4 (Week 1): Pt will complete toileting with Max Assist  OT Short Term Goal 5 (Week 1): Pt will demo ability to complete HEP for BUE with supervision  Skilled Therapeutic Interventions/Progress Updates:    Pt engaged in bed mobility and unsupported sitting balance tasks this morning.  Pt initiates rolling to right and left but requires +2 assist to complete tasks.  Pt rolled right and left X 4 to place abdominal binder and maxi move sling in preparation for transfer to bariatric seat.  Pt does not have clothing at this time.  After positioned in seat pt engaged in leaning forward and pushing through BLE in preparation for sit<>stand and standing tasks.  With therapist positioned in front, pt was able to place BUE on chair rails while leaning forward and then pushing back to upright position.  Pt performed this task X 4.  Pt indicated she was fearful of falling but was encouraged by progress this morning.  Therapy Documentation Precautions:  Precautions Precautions: Fall Precaution Comments: abdominal wound, colostomy Required Braces or Orthoses: Other Brace/Splint Other Brace/Splint: abdominal binder when OOB Restrictions Weight Bearing Restrictions: No  Pain: Pain Assessment Pain Score: Asleep ADL: ADL ADL Comments: see Functional Assessment  See Function Navigator for Current Functional Status.   Therapy/Group: Individual Therapy  Leroy Libman 06/29/2015, 9:08 AM

## 2015-06-29 NOTE — Progress Notes (Signed)
Occupational Therapy Session Note  Patient Details  Name: Dominique [name] MRN: [id number] Date of Birth: 09-10-1981  Today's Date: 06/29/2015 OT Co-Treatment Time: 1030-1045 (co-tx entire time 1030-1100) OT Co-Treatment Time Calculation (min): 15 min   Short Term Goals: Week 1:  OT Short Term Goal 1 (Week 1): Pt will complete upper body bathing seated with Mod Assist OT Short Term Goal 2 (Week 1): Pt will complete transfer from EOB to w/c with Max Assist OT Short Term Goal 3 (Week 1): Pt will complete lower body bathing supine with Mod Assist OT Short Term Goal 4 (Week 1): Pt will complete toileting with Max Assist  OT Short Term Goal 5 (Week 1): Pt will demo ability to complete HEP for BUE with supervision  Skilled Therapeutic Interventions/Progress Updates:    Co-tx with primary OT with focus on self-care tasks and trunk control with UB bathing and dressing tasks.  Pt in bed upon arrival.  Therapist positioned self in front of pt in bed to facilitate upright sitting balance and trunk control in bed (bariatric air mattress) while primary therapist assisted with washing back and readjusting fabricated bra (made with ace wrap to bring breasts midline).  Therapist providing tactile cues and manual facilitation to maintain upright sitting.  Therapy Documentation Precautions:  Precautions Precautions: Fall Precaution Comments: abdominal wound, colostomy Required Braces or Orthoses: Other Brace/Splint Other Brace/Splint: abdominal binder when OOB Restrictions Weight Bearing Restrictions: No Pain: Pain Assessment Pain Assessment: No/denies pain Pain Score: 3  Pain Type: Acute pain;Surgical pain Pain Location: Abdomen Pain Orientation: Mid Pain Descriptors / Indicators: Aching Pain Frequency: Intermittent Pain Onset: On-going Patients Stated Pain Goal: 0 Pain Intervention(s): Repositioned ADL: ADL ADL Comments: see Functional Assessment  See Function Navigator for Current Functional  Status.   Therapy/Group: Individual Therapy  Simonne Come 06/29/2015, 12:04 PM

## 2015-06-29 NOTE — Progress Notes (Signed)
Pt continues to refuse CPAP. Pt understands therapy and barely uses at home. She explained she uses [facility] during sleep. Pt understands she can call if she changes her mind.

## 2015-06-29 NOTE — Progress Notes (Signed)
Occupational Therapy Session Note  Patient Details  Name: Dominique [name] MRN: [id number] Date of Birth: Feb 17, 1981  Today's Date: 06/29/2015 OT Individual Time: 10:00-10:30 & 1045-1130 OT Individual Time Calculation (min): 75 min    Short Term Goals: Week 1:  OT Short Term Goal 1 (Week 1): Pt will complete upper body bathing seated with Mod Assist OT Short Term Goal 2 (Week 1): Pt will complete transfer from EOB to w/c with Max Assist OT Short Term Goal 3 (Week 1): Pt will complete lower body bathing supine with Mod Assist OT Short Term Goal 4 (Week 1): Pt will complete toileting with Max Assist  OT Short Term Goal 5 (Week 1): Pt will demo ability to complete HEP for BUE with supervision  Skilled Therapeutic Interventions/Progress Updates: ADL-retraining with focus on energy conservation, adapted bathing/dressing skills using AE, toileting.   Pt received seated in reclining chair and reporting fatigue from prolonged upright positioning, approx 1.5 hrs.   OT educated pt on use of out-of-bed scheduling to increase awareness of progress and advised bathing upper body while sitting however pt countered that she would be unable to manage mass of her breasts with make-shift bra removed while in supported sitting.   Pt able to justify need for transfer back to bed to complete bathing supine, using HOB elevated to attend to upper body.   Pt remains in only hospital gown throughout session d/t no clothing brought from home.    Pt required mech assist lift back to bed with ability to direct and cue caregivers on optimal positioning using sling.   OT educated pt on use of LH sponge to reach under her arms and upper legs.   Pt bathed supine with Max A of 1, +2 helper to assist with bed mobility when pt rolls right to left.   Pt participated in 15 min of co-tx with additional therapist to provoke anterior leaning and improved use of HOB to shift weight while sitting upright in bed.     Therapy  Documentation Precautions:  Precautions Precautions: Fall Precaution Comments: abdominal wound, colostomy Required Braces or Orthoses: Other Brace/Splint Other Brace/Splint: abdominal binder when OOB Restrictions Weight Bearing Restrictions: No  Pain: Pain Assessment Pain Assessment: No/denies pain Pain Score: 3  Pain Type: Acute pain;Surgical pain Pain Location: Abdomen Pain Orientation: Mid Pain Descriptors / Indicators: Aching Pain Frequency: Intermittent Pain Onset: On-going Patients Stated Pain Goal: 0 Pain Intervention(s): Repositioned  ADL: ADL ADL Comments: see Functional Assessment   See Function Navigator for Current Functional Status.   Therapy/Group: Individual Therapy  Lexa 06/29/2015, 12:58 PM

## 2015-06-29 NOTE — Progress Notes (Signed)
34 y.o. right handed morbidly obese female admitted 06/04/2015 with noted history of asthma, chronic systolic congestive heart failure and recent recurrent colitis since January 2016.  Subjective/Complaints: Appreciate surgery note Pt states she didn't use O2 at home, only uses at night here in hospital    Objective: Vital Signs: Blood pressure 113/58, pulse 98, temperature 98.3 F (36.8 C), temperature source Oral, resp. rate 19, SpO2 98 %. No results found. Results for orders placed or performed during the hospital encounter of 06/26/15 (from the past 72 hour(s))  CBC     Status: Abnormal   Collection Time: 06/26/15  7:55 PM  Result Value Ref Range   WBC 6.5 4.0 - 10.5 K/uL   RBC 3.43 (L) 3.87 - 5.11 MIL/uL   Hemoglobin 7.9 (L) 12.0 - 15.0 g/dL   HCT 25.9 (L) 36.0 - 46.0 %   MCV 75.5 (L) 78.0 - 100.0 fL   MCH 23.0 (L) 26.0 - 34.0 pg   MCHC 30.5 30.0 - 36.0 g/dL   RDW 28.8 (H) 11.5 - 15.5 %   Platelets 254 150 - 400 K/uL  Creatinine, serum     Status: Abnormal   Collection Time: 06/26/15  7:55 PM  Result Value Ref Range   Creatinine, Ser 1.18 (H) 0.44 - 1.00 mg/dL   GFR calc non Af Amer 59 (L) >60 mL/min   GFR calc Af Amer >60 >60 mL/min    Comment: (NOTE) The eGFR has been calculated using the CKD EPI equation. This calculation has not been validated in all clinical situations. eGFR's persistently <60 mL/min signify possible Chronic Kidney Disease.   Glucose, capillary     Status: None   Collection Time: 06/26/15  9:05 PM  Result Value Ref Range   Glucose-Capillary 84 65 - 99 mg/dL  CBC WITH DIFFERENTIAL     Status: Abnormal   Collection Time: 06/27/15  5:43 AM  Result Value Ref Range   WBC 6.5 4.0 - 10.5 K/uL    Comment: REPEATED TO VERIFY CONSISTENT WITH PREVIOUS RESULT    RBC 3.36 (L) 3.87 - 5.11 MIL/uL   Hemoglobin 7.6 (L) 12.0 - 15.0 g/dL    Comment: REPEATED TO VERIFY CONSISTENT WITH PREVIOUS RESULT    HCT 25.6 (L) 36.0 - 46.0 %   MCV 76.2 (L) 78.0 - 100.0 fL    MCH 22.6 (L) 26.0 - 34.0 pg   MCHC 29.7 (L) 30.0 - 36.0 g/dL   RDW 28.6 (H) 11.5 - 15.5 %   Platelets 258 150 - 400 K/uL    Comment: REPEATED TO VERIFY CONSISTENT WITH PREVIOUS RESULT    Neutrophils Relative % 73 %   Lymphocytes Relative 8 %   Monocytes Relative 16 %   Eosinophils Relative 3 %   Basophils Relative 0 %   Neutro Abs 4.8 1.7 - 7.7 K/uL   Lymphs Abs 0.5 (L) 0.7 - 4.0 K/uL   Monocytes Absolute 1.0 0.1 - 1.0 K/uL   Eosinophils Absolute 0.2 0.0 - 0.7 K/uL   Basophils Absolute 0.0 0.0 - 0.1 K/uL   RBC Morphology POLYCHROMASIA PRESENT     Comment: BASOPHILIC STIPPLING  Comprehensive metabolic panel     Status: Abnormal   Collection Time: 06/27/15  5:43 AM  Result Value Ref Range   Sodium 138 135 - 145 mmol/L   Potassium 3.4 (L) 3.5 - 5.1 mmol/L   Chloride 104 101 - 111 mmol/L   CO2 29 22 - 32 mmol/L   Glucose, Bld 91 65 - 99  mg/dL   BUN 21 (H) 6 - 20 mg/dL   Creatinine, Ser 1.07 (H) 0.44 - 1.00 mg/dL   Calcium 7.6 (L) 8.9 - 10.3 mg/dL   Total Protein 4.3 (L) 6.5 - 8.1 g/dL   Albumin 1.4 (L) 3.5 - 5.0 g/dL   AST 28 15 - 41 U/L   ALT 20 14 - 54 U/L   Alkaline Phosphatase 102 38 - 126 U/L   Total Bilirubin 0.6 0.3 - 1.2 mg/dL   GFR calc non Af Amer >60 >60 mL/min   GFR calc Af Amer >60 >60 mL/min    Comment: (NOTE) The eGFR has been calculated using the CKD EPI equation. This calculation has not been validated in all clinical situations. eGFR's persistently <60 mL/min signify possible Chronic Kidney Disease.    Anion gap 5 5 - 15  Glucose, capillary     Status: Abnormal   Collection Time: 06/27/15  6:41 AM  Result Value Ref Range   Glucose-Capillary 109 (H) 65 - 99 mg/dL  Glucose, capillary     Status: Abnormal   Collection Time: 06/27/15 11:47 AM  Result Value Ref Range   Glucose-Capillary 123 (H) 65 - 99 mg/dL  Glucose, capillary     Status: Abnormal   Collection Time: 06/27/15  4:18 PM  Result Value Ref Range   Glucose-Capillary 119 (H) 65 - 99 mg/dL   Glucose, capillary     Status: Abnormal   Collection Time: 06/27/15  8:47 PM  Result Value Ref Range   Glucose-Capillary 108 (H) 65 - 99 mg/dL  Glucose, capillary     Status: None   Collection Time: 06/28/15  6:57 AM  Result Value Ref Range   Glucose-Capillary 91 65 - 99 mg/dL  Glucose, capillary     Status: None   Collection Time: 06/28/15 11:35 AM  Result Value Ref Range   Glucose-Capillary 92 65 - 99 mg/dL  Glucose, capillary     Status: Abnormal   Collection Time: 06/28/15  4:08 PM  Result Value Ref Range   Glucose-Capillary 102 (H) 65 - 99 mg/dL  Glucose, capillary     Status: None   Collection Time: 06/28/15  9:08 PM  Result Value Ref Range   Glucose-Capillary 94 65 - 99 mg/dL  Glucose, capillary     Status: Abnormal   Collection Time: 06/29/15  6:46 AM  Result Value Ref Range   Glucose-Capillary 113 (H) 65 - 99 mg/dL     HEENT: light sensitive Right eye Cardio: RRR and no murmur Resp: CTA B/L and unlabored GI: BS positive and LLQ colostomy, midline open abd wound with Wet to Dry ,  Extremity:  No Edema Skin:   Wound see above, no heel ulceration Neuro: Alert/Oriented and Abnormal Motor 5/5 in BUE, 3- bilateral HF, 4- Knee ext and ankle DF/PF Musc/Skel:  Other no pain with UE and LE ROM Gen NAD   Assessment/Plan: 1. Functional deficits secondary to debilitation/Crohn's disease/perforated left colon with peritonitis/exploratory laparotomy with drainage of intra-abdominal abscess partial colon resection with colostomy which require 3+ hours per day of interdisciplinary therapy in a comprehensive inpatient rehab setting. Physiatrist is providing close team supervision and 24 hour management of active medical problems listed below. Physiatrist and rehab team continue to assess barriers to discharge/monitor patient progress toward functional and medical goals.  Discussed D/C date with pt  FIM: Function - Bathing Position: Bed Body parts bathed by patient: Right arm,  Left arm Body parts bathed by helper: Left upper leg,  Right lower leg, Left lower leg, Right upper leg Bathing not applicable: Abdomen, Buttocks, Front perineal area (peri care done prior to OT session) Assist Level: 2 helpers  Function- Upper Body Dressing/Undressing What is the patient wearing?: [place] Body Dressing/Undressing What is the patient wearing?: [place] activity did not occur: N/A Toileting steps completed by patient: Adjust clothing prior to toileting, Performs perineal hygiene, Adjust clothing after toileting Toileting steps completed by helper: Adjust clothing prior to toileting, Performs perineal hygiene, Adjust clothing after toileting Assist level: Two helpers  Function - Air cabin crew transfer activity did not occur: N/A Toilet transfer assistive device: Facilities manager lift: [place] level to toilet: 2 helpers Assist level from toilet: 2 helpers  Function - Chair/bed transfer Chair/bed transfer activity did not occur: Safety/medical concerns Chair/bed transfer method: Other Chair/bed transfer assist level: dependent (Pt equals 0%) Chair/bed transfer assistive device: Mechanical lift Mechanical lift: [place] Chair/bed transfer details: Verbal cues for safe use of DME/AE  Function - Locomotion: Wheelchair Will patient use wheelchair at discharge?: Yes Type: Manual Wheelchair activity did not occur: Safety/medical concerns (debility and awaiting appropriate equipment) Function - Locomotion: Ambulation Ambulation activity did not occur: Safety/medical concerns (significant debility)  Function - Comprehension Comprehension: Auditory Comprehension assist level: Follows basic conversation/direction with extra time/assistive device  Function - Expression Expression: Verbal Expression assist level: Expresses basic needs/ideas: With extra time/assistive device  Function -  Social Interaction Social Interaction assist level: Interacts appropriately 90% of the time - Needs monitoring or encouragement for participation or interaction.  Function - Problem Solving Problem solving assist level: Solves basic 90% of the time/requires cueing < 10% of the time  Function - Memory Memory assist level: Complete Independence: No helper Patient normally able to recall (first 3 days only): Current season, Location of own room, Staff names and faces, That he or she is in a hospital   Medical Problem List and Plan: 1. Functional deficits secondary to debilitation/Crohn's disease/perforated left colon with peritonitis/exploratory laparotomy with drainage of intra-abdominal abscess partial colon resection with colostomy. Continue Pentasa 500 mg 4 times a day 2.  DVT Prophylaxis/Anticoagulation: Subcutaneous heparin. Monitor platelet counts of any signs of bleeding 3. Pain Management: Hydrocodone as needed 4. Acute blood loss anemia. Patient transfused follow-up CBC, 7.9gm this am 5. Neuropsych: This patient is capable of making decisions on her own behalf. 6. Skin/Wound Care: dehiscence noted at muco cutaneous jct of stoma- CCS and WOC RN following 7. Fluids/Electrolytes/Nutrition: Strict I&O with follow-up chemistries 8. Acute renal insufficiency 2.64-2.84. Creatinine latest result 1.09. Follow-up chemistries stable 9. Acute respiratory failure/VDRF. Extubated 06/14/2015.propable obesity hypoventilation syndrome, cont PRN O2 10. SVT. Monitor heart rate. Lopressor 50 mg twice a day, pt also severely deconditioned with poor exercise tolerance 11. Chronic systolic congestive heart failure. Monitor for any signs of fluid overload. Lasix 40 mg twice a day, monitor intake and creat  13. Morbid obesity. Body mass index 67.33 14.  Mild hypokalemia - will supplement  15.  Severe hypoalbuminemia- supplement   LOS (Days) 3 A FACE TO FACE EVALUATION WAS PERFORMED  KIRSTEINS,ANDREW  E 06/29/2015, 8:17 AM

## 2015-06-29 NOTE — Progress Notes (Signed)
Physical Therapy Session Note  Patient Details  Name: Dominique [name] MRN: [id number] Date of Birth: 24-Dec-1980  Today's Date: 06/29/2015 PT Individual Time: 1405-1500 PT Individual Time Calculation (min): 55 min   Short Term Goals: Week 1:  PT Short Term Goal 1 (Week 1): Pt will be able to tolerate OOB in recliner x 4 hours a day to promote upright tolerance PT Short Term Goal 2 (Week 1): Pt will be able to perform sitting balance activities x 10 min with mod A PT Short Term Goal 3 (Week 1): Pt will be able to initiate weightbearing through BLE with lift equipment  Skilled Therapeutic Interventions/Progress Updates:    Pt received in bed, requesting to first do bed level exercises and then trial the manual w/c PT brought into room. Therapeutic Exercise: PT instructs pt in AAROM SLR with eccentric use trying to hold leg up x 10 reps each leg. Pt places bed in reverse [place] with cues and with Maxi Slide under pt, pt completes modified mini squats with feet against footboard 2 x 30 reps. Pt reports it feels good to use her muscles. PT explains that this can be attempted on tilt table the following day, as well. Therapeutic Activity - See function tab for bed mobility. Pt rolled to don 2 abdominal binders and place/remove maxi Slide for exercises and Maxi Sling for transfer. Pt dependently transferred bed to/from 24x18 manual w/c. Wheelchair Management - See function tab for details. Pt reports this is a good arm workout. Pt fatigues quickly. SpO2 95% on RA and HR 92 bpm after w/c propulsion activity. PT notes that pt should not be left up in manual w/c for long periods of time due to compression on lateral sides of body from armrests and pt agrees to use it during therapy. Pt ended in bed with nurse in room. Abdominal binders soiled with bowel. Continue per PT pOC.   Therapy Documentation Precautions:  Precautions Precautions: Fall Precaution Comments: abdominal wound, colostomy Required  Braces or Orthoses: Other Brace/Splint Other Brace/Splint: abdominal binder when OOB Restrictions Weight Bearing Restrictions: No Pain: Pain Assessment Pain Assessment: Faces Faces Pain Scale: Hurts little more Pain Type: Acute pain Pain Location: Leg Pain Orientation: Right Pain Descriptors / Indicators: Other (Comment) (pinch) Pain Onset: Sudden Pain Intervention(s): Repositioned Multiple Pain Sites: No    See Function Navigator for Current Functional Status.   Therapy/Group: Individual Therapy with Jenn, OT as +2  Effa Yarrow M 06/29/2015, 4:29 PM

## 2015-06-30 ENCOUNTER — Inpatient Hospital Stay (HOSPITAL_COMMUNITY): Payer: Medicaid Other

## 2015-06-30 ENCOUNTER — Inpatient Hospital Stay (HOSPITAL_COMMUNITY): Payer: Medicaid Other | Admitting: Physical Therapy

## 2015-06-30 LAB — GLUCOSE, CAPILLARY
GLUCOSE-CAPILLARY: 99 mg/dL (ref 65–99)
Glucose-Capillary: 93 mg/dL (ref 65–99)
Glucose-Capillary: 87 mg/dL (ref 65–99)
Glucose-Capillary: 103 mg/dL — ABNORMAL HIGH (ref 65–99)

## 2015-06-30 LAB — URINALYSIS, ROUTINE W REFLEX MICROSCOPIC
Bilirubin Urine: NEGATIVE
Glucose, UA: NEGATIVE mg/dL
Ketones, ur: NEGATIVE mg/dL
NITRITE: POSITIVE — AB
PROTEIN: NEGATIVE mg/dL
SPECIFIC GRAVITY, URINE: 1.015 (ref 1.005–1.030)
UROBILINOGEN UA: 0.2 mg/dL (ref 0.0–1.0)
pH: 5 (ref 5.0–8.0)

## 2015-06-30 LAB — URINE MICROSCOPIC-ADD ON

## 2015-06-30 MED ORDER — PHENAZOPYRIDINE HCL 200 MG PO TABS
200.0000 mg | ORAL_TABLET | Freq: Three times a day (TID) | ORAL | Status: DC
  Administered 2015-06-30 – 2015-07-04 (×10): 200 mg via ORAL
  Filled 2015-06-30 (×14): qty 1

## 2015-06-30 MED ORDER — HYDROCODONE-ACETAMINOPHEN 7.5-325 MG PO TABS
2.0000 | ORAL_TABLET | ORAL | Status: DC | PRN
  Administered 2015-06-30 – 2015-07-21 (×73): 2 via ORAL
  Filled 2015-06-30 (×75): qty 2

## 2015-06-30 MED ORDER — CIPROFLOXACIN HCL 250 MG PO TABS
250.0000 mg | ORAL_TABLET | Freq: Two times a day (BID) | ORAL | Status: DC
  Administered 2015-06-30 – 2015-07-02 (×4): 250 mg via ORAL
  Filled 2015-06-30 (×5): qty 1

## 2015-06-30 NOTE — Progress Notes (Signed)
Occupational Therapy Session Note  Patient Details  Name: Dominique [name] MRN: [id number] Date of Birth: 04-18-1981  Today's Date: 06/30/2015 OT Individual Time: 0900-1000 OT Individual Time Calculation (min): 60 min    Short Term Goals: Week 1:  OT Short Term Goal 1 (Week 1): Pt will complete upper body bathing seated with Mod Assist OT Short Term Goal 2 (Week 1): Pt will complete transfer from EOB to w/c with Max Assist OT Short Term Goal 3 (Week 1): Pt will complete lower body bathing supine with Mod Assist OT Short Term Goal 4 (Week 1): Pt will complete toileting with Max Assist  OT Short Term Goal 5 (Week 1): Pt will demo ability to complete HEP for BUE with supervision  Skilled Therapeutic Interventions/Progress Updates: ADL-retraining at bed level with focus on improved activity tolerance,pt re-ed on improved support of upper torso/abdomin using abdominal binder, pain management, and re-ed on use of LH sponge.   Pt received asleep in bed awaiting surgical service NP/PA to provide abdominal dressing change.   Pt is setup for bed level bathing as providers arrive.   Pt requires setup assist to reposition and emotional support during procedure d/t pain/anxiety.   OT notes pt as exhausted following dressing change and OT monitors vitals, noting BP at 100/45, HR up to 110 bpm with decline in oxygen saturation to 86%.   Pt provided supplemental 02 and rest to recover, approx 5 min required.   Pt requires +2 assist to roll right/left as therapist and help provide re-ed on use of abdominal binder to provide support to abdomin and anchor for make-shift bra however pt continues to report spasms at lower abdomin and she required continued assist and rest breaks during all self-care.   Pt remained alert throughout session with appropriate interaction, attention and awareness.   OT alerts RN and PTs to pt status.   BP checked again with pt supine at end of session, 100/40.          Therapy  Documentation Precautions:  Precautions Precautions: Fall Precaution Comments: abdominal wound, colostomy Required Braces or Orthoses: Other Brace/Splint Other Brace/Splint: abdominal binder when OOB Restrictions Weight Bearing Restrictions: No  Vital Signs: Therapy Vitals Pulse Rate: (!) 110 Resp: 20 BP: (!) 100/45 mmHg Oxygen Therapy SpO2: (!) 86 % O2 Device: Not Delivered  Pain: Pain Assessment Pain Assessment: 0-10 Pain Score: 9  Pain Type: Acute pain Pain Location: Abdomen Pain Orientation: Mid Pain Descriptors / Indicators: Spasm Pain Frequency: Constant Pain Onset: On-going Pain Intervention(s): Emotional support Multiple Pain Sites: No  ADL: ADL ADL Comments: see Functional Assessment  See Function Navigator for Current Functional Status.   Therapy/Group: Individual Therapy  [place] 06/30/2015, 12:54 PM

## 2015-06-30 NOTE — Consult Note (Addendum)
WOC follow-up: Pt states ostomy pouch was changed by bedside nurse yesterday when it was leaking.  They packed peristomal mucutaneous separation area with Aqaucel and applied barrier ring and pouch as ordered.  Current pouch in place with good seal, mod amt thick reddish tan drainage, no stool.  CCS continues to follow for assessment and plan of care to abd wound and stoma.  Supplies at bedside with instructions for staff nurse use. Julien Girt MSN, RN, [place], [place], [place]

## 2015-06-30 NOTE — Progress Notes (Signed)
Physical Therapy Session Note  Patient Details  Name: Dominique [name] MRN: [id number] Date of Birth: [birth year]  Today's Date: 06/30/2015 PT Individual Time:  -  1030-1155    Treatment Time: 85 min   Short Term Goals: Week 1:  PT Short Term Goal 1 (Week 1): Pt will be able to tolerate OOB in recliner x 4 hours a day to promote upright tolerance PT Short Term Goal 2 (Week 1): Pt will be able to perform sitting balance activities x 10 min with mod A PT Short Term Goal 3 (Week 1): Pt will be able to initiate weightbearing through BLE with lift equipment  Skilled Therapeutic Interventions/Progress Updates:  Treatment Session 1:   Pt received supine in bed - reporting severe abdominal spasms since last night. Pt agreeable to trial LE exercises and throughout session reports LE exercises do not increase her abdominal pain/spasms. Therapeutic Exercises: Bed placed in max reverse trendellenburg (20 degrees) for partial LE weight bearing exercises: mini-squats, heel raises, marching - focus on pushing into hip/knee extension on support leg, and hip IR/ER in order to walk leg out to side of footboard and back towards midline. All exercises done 3 x 30 reps each leg, except hip IR/ER in partial weight bear 3 x 10 reps with maxi slide under legs for assist. Pt transitioned to supine level with bed controls and instructed in supine hip abduction/adduction 3 x 30 reps, gravity minimized knee flexion 3 x 20 reps, and anti-gravity heel slides (minimal arom noted, but pt refusing assistance) 3 x 10-15 reps each leg. Therapeutic Activity - Pt requested to urinate at end of session. PT obtained and placed urinal, turned sink water on at pt request, but pt unable to produce urine. Pt ended with nurse in room, reporting that they were going to try a heating pad for her stomach.   PT entered to attempt treatment session 2, but pt fatigued and continuing to have abdominal spasms. PT checked back 25 minutes later, and pt again  refused. Continue per PT POC as pt able and willing.    Therapy Documentation Precautions:  Precautions Precautions: Fall Precaution Comments: abdominal wound, colostomy Required Braces or Orthoses: Other Brace/Splint Other Brace/Splint: abdominal binder when OOB Restrictions Weight Bearing Restrictions: No General: PT Amount of Missed Time (min): 5 Minutes PT Missed Treatment Reason: Patient fatigue Vital Signs: Therapy Vitals Pulse Rate: 94 Resp: 18 BP: 112/63 mmHg Patient Position (if appropriate): Lying (at 20 deg reverse trendelenberg) Oxygen Therapy SpO2: 98 % O2 Device: Not Delivered Pain: Pain Assessment Pain Assessment: 0-10 Pain Score: 9  Pain Type: Acute pain Pain Location: Abdomen Pain Orientation: Mid Pain Descriptors / Indicators: Spasm Pain Frequency: Constant Pain Onset: On-going Pain Intervention(s): Emotional support Multiple Pain Sites: No   See Function Navigator for Current Functional Status.   Therapy/Group: Individual Therapy with Letta Moynahan, PT as +2 for clinical reasoning/problem solving assist  [facility] M 06/30/2015, 11:24 AM

## 2015-06-30 NOTE — Progress Notes (Signed)
34 y.o. right handed morbidly obese female admitted 06/04/2015 with noted history of asthma, chronic systolic congestive heart failure and recent recurrent colitis since January 2016.  Subjective/Complaints: Discussed with RN and WOC RN, pt requesting pain med more often in evening for abd pain, reduced colostomy output yesterday   ROS:  No CP or SOB, no increased limb swelling Objective: Vital Signs: Blood pressure 105/48, pulse 91, temperature 98 F (36.7 C), temperature source Oral, resp. rate 19, SpO2 98 %. No results found. Results for orders placed or performed during the hospital encounter of 06/26/15 (from the past 72 hour(s))  Glucose, capillary     Status: Abnormal   Collection Time: 06/27/15 11:47 AM  Result Value Ref Range   Glucose-Capillary 123 (H) 65 - 99 mg/dL  Glucose, capillary     Status: Abnormal   Collection Time: 06/27/15  4:18 PM  Result Value Ref Range   Glucose-Capillary 119 (H) 65 - 99 mg/dL  Glucose, capillary     Status: Abnormal   Collection Time: 06/27/15  8:47 PM  Result Value Ref Range   Glucose-Capillary 108 (H) 65 - 99 mg/dL  Glucose, capillary     Status: None   Collection Time: 06/28/15  6:57 AM  Result Value Ref Range   Glucose-Capillary 91 65 - 99 mg/dL  Glucose, capillary     Status: None   Collection Time: 06/28/15 11:35 AM  Result Value Ref Range   Glucose-Capillary 92 65 - 99 mg/dL  Glucose, capillary     Status: Abnormal   Collection Time: 06/28/15  4:08 PM  Result Value Ref Range   Glucose-Capillary 102 (H) 65 - 99 mg/dL  Glucose, capillary     Status: None   Collection Time: 06/28/15  9:08 PM  Result Value Ref Range   Glucose-Capillary 94 65 - 99 mg/dL  Glucose, capillary     Status: Abnormal   Collection Time: 06/29/15  6:46 AM  Result Value Ref Range   Glucose-Capillary 113 (H) 65 - 99 mg/dL  Glucose, capillary     Status: None   Collection Time: 06/29/15 11:40 AM  Result Value Ref Range   Glucose-Capillary 97 65 - 99 mg/dL   Glucose, capillary     Status: Abnormal   Collection Time: 06/29/15  4:43 PM  Result Value Ref Range   Glucose-Capillary 104 (H) 65 - 99 mg/dL  Glucose, capillary     Status: Abnormal   Collection Time: 06/29/15  9:31 PM  Result Value Ref Range   Glucose-Capillary 102 (H) 65 - 99 mg/dL   Comment 1 Notify RN   Glucose, capillary     Status: None   Collection Time: 06/30/15  6:50 AM  Result Value Ref Range   Glucose-Capillary 93 65 - 99 mg/dL   Comment 1 Notify RN      HEENT: light sensitive Right eye Cardio: RRR and no murmur Resp: CTA B/L and unlabored GI: BS positive and LLQ colostomy, midline open abd wound with Wet to Dry ,  Extremity:  No Edema Skin:   Wound see above, no heel ulceration Neuro: Alert/Oriented and Abnormal Motor 5/5 in BUE, 3- bilateral HF,able to lift heel off bed ~3 inches when supine 4- Knee ext and ankle DF/PF Musc/Skel:  Other no pain with UE and LE ROM Gen NAD   Assessment/Plan: 1. Functional deficits secondary to debilitation/Crohn's disease/perforated left colon with peritonitis/exploratory laparotomy with drainage of intra-abdominal abscess partial colon resection with colostomy which require 3+ hours per day of  interdisciplinary therapy in a comprehensive inpatient rehab setting. Physiatrist is providing close team supervision and 24 hour management of active medical problems listed below. Physiatrist and rehab team continue to assess barriers to discharge/monitor patient progress toward functional and medical goals.   FIM: Function - Bathing Position: Bed Body parts bathed by patient: Right arm, Left arm Body parts bathed by helper: Left upper leg, Right lower leg, Left lower leg, Right upper leg Bathing not applicable: Abdomen, Buttocks, Front perineal area (peri care done prior to OT session) Assist Level: 2 helpers  Function- Upper Body Dressing/Undressing What is the patient wearing?: Hospital gown Function - Lower Body  Dressing/Undressing What is the patient wearing?: [place] activity did not occur: N/A Toileting steps completed by patient: Adjust clothing prior to toileting, Performs perineal hygiene, Adjust clothing after toileting Toileting steps completed by helper: Adjust clothing prior to toileting, Performs perineal hygiene, Adjust clothing after toileting Assist level: Two helpers  Function - Air cabin crew transfer activity did not occur: N/A Toilet transfer assistive device: Facilities manager lift: [place] Assist level to toilet: 2 helpers Assist level from toilet: 2 helpers  Function - Chair/bed transfer Chair/bed transfer activity did not occur: Safety/medical concerns Chair/bed transfer method: Other Chair/bed transfer assist level: dependent (Pt equals 0%) Chair/bed transfer assistive device: Mechanical lift Mechanical lift: Maximove Chair/bed transfer details: Verbal cues for safe use of DME/AE, Manual facilitation for placement  Function - Locomotion: Wheelchair Will patient use wheelchair at discharge?: Yes Type: Manual Wheelchair activity did not occur: Safety/medical concerns (debility and awaiting appropriate equipment) Max wheelchair distance: 44' + 13' + 40' Assist Level: Touching or steadying assistance (Pt > 75%) Turns around,maneuvers to table,bed, and toilet,negotiates 3% grade,maneuvers on rugs and over doorsills: No Function - Locomotion: Ambulation Ambulation activity did not occur: Safety/medical concerns (significant debility)  Function - Comprehension Comprehension: Auditory Comprehension assist level: Understands complex 90% of the time/cues 10% of the time  Function - Expression Expression: Verbal Expression assist level: Expresses basic needs/ideas: With no assist  Function - Social Interaction Social Interaction assist level: Interacts appropriately 90% of the time - Needs monitoring or  encouragement for participation or interaction.  Function - Problem Solving Problem solving assist level: Solves basic 90% of the time/requires cueing < 10% of the time  Function - Memory Memory assist level: Complete Independence: No helper Patient normally able to recall (first 3 days only): Current season, Location of own room, Staff names and faces, That he or she is in a hospital   Medical Problem List and Plan: 1. Functional deficits secondary to debilitation/Crohn's disease/perforated left colon with peritonitis/exploratory laparotomy with drainage of intra-abdominal abscess partial colon resection with colostomy. Continue Pentasa 500 mg 4 times a day 2.  DVT Prophylaxis/Anticoagulation: Subcutaneous heparin. Monitor platelet counts, currently noi signs of bleeding 3. Pain Management: Hydrocodone as needed 4. Acute blood loss anemia. Patient transfused follow-up CBC, 7.9gm this am 5. Neuropsych: This patient is capable of making decisions on her own behalf. 6. Skin/Wound Care: dehiscence noted at muco cutaneous jct of stoma- CCS seeing pt on M-W-F and WOC RN following 7. Fluids/Electrolytes/Nutrition: Strict I&O with follow-up chemistries 8. Acute renal insufficiency 2.64-2.84. Creatinine latest result 1.09. Follow-up chemistries stable 9. Acute respiratory failure/VDRF. Extubated 06/14/2015.propable obesity hypoventilation syndrome, cont PRN O2 10. SVT. Monitor heart rate. Lopressor 50 mg twice a day, pt also severely deconditioned with poor exercise tolerance 11. Chronic systolic congestive heart failure. Monitor for any signs of fluid  overload. Lasix 40 mg twice a day, monitor intake and creat, no SOB, sats ok  13. Morbid obesity. Body mass index 67.33 14.  Mild hypokalemia - will supplement  15.  Severe hypoalbuminemia- supplement , this is contributing to delayed wound healing  LOS (Days) 4 A FACE TO FACE EVALUATION WAS PERFORMED  Sirenity Shew E 06/30/2015, 7:39 AM

## 2015-06-30 NOTE — Progress Notes (Signed)
Pt's output via colostomy at 20-40cc liquid blood tinged, yellowish stool. Emina Riebock,NP   aware, assessed with dressing change this am.  Also made aware pt. c/o increased abd. "spasms".  Continued to c/o abd. discomfort and spasms throughout shift, Silvestre Mesi [facility] aware.  Bladder area tender as well; pt. voiding frequent small amounts.  Bladder scan done- 210cc.  Foley placed due to spasms, ? related to bladder.  Urine milky cloudy with sediment and mucous, foul odor. Linna Hoff, [facility] aware. U/A sent, results to [facility]. Pt. placed on Cipro and pyridium.  Pt had large amount emesis at 1800; approx. 600cc,appeared to be ensure, which is all pt. has taken in over last 3 days.  Refuses all food except occasional bite or 2 of applesauce. PAC aware.

## 2015-06-30 NOTE — Progress Notes (Signed)
Pt continues to refuses CPAP. Pt will call if she changes her mind.

## 2015-07-01 ENCOUNTER — Inpatient Hospital Stay (HOSPITAL_COMMUNITY): Payer: Medicaid Other | Admitting: Occupational Therapy

## 2015-07-01 ENCOUNTER — Inpatient Hospital Stay (HOSPITAL_COMMUNITY): Payer: Medicaid Other | Admitting: Physical Therapy

## 2015-07-01 LAB — GLUCOSE, CAPILLARY
GLUCOSE-CAPILLARY: 102 mg/dL — AB (ref 65–99)
GLUCOSE-CAPILLARY: 93 mg/dL (ref 65–99)
GLUCOSE-CAPILLARY: 122 mg/dL — AB (ref 65–99)
Glucose-Capillary: 91 mg/dL (ref 65–99)

## 2015-07-01 NOTE — Progress Notes (Signed)
Occupational Therapy Session Note  Patient Details  Name: Rayanne Padmanabhan MRN: [id number] Date of Birth: Jul 09, 1981  Today's Date: 07/01/2015 OT Individual Time:  -   1000-1100  (60 min)     -       Short Term Goals: Week 1:  OT Short Term Goal 1 (Week 1): Pt will complete upper body bathing seated with Mod Assist OT Short Term Goal 2 (Week 1): Pt will complete transfer from EOB to w/c with Max Assist OT Short Term Goal 3 (Week 1): Pt will complete lower body bathing supine with Mod Assist OT Short Term Goal 4 (Week 1): Pt will complete toileting with Max Assist  OT Short Term Goal 5 (Week 1): Pt will demo ability to complete HEP for BUE with supervision    Skilled Therapeutic Interventions/Progress Updates:    Abdominal precautions (open wound); new ostomy bag; 2 abd binders when oob  Precautions: Fall, colostomy (wear binders when OOB, ok over colostomy).  Addressed bathing at bed level, rolling.  Pt rolled to right with total assist +2.  Pt used LH sponge for bathing UB and part of LB.    Therapy Documentation Precautions:  Precautions Precautions: Fall Precaution Comments: abdominal wound, colostomy Required Braces or Orthoses: Other Brace/Splint Other Brace/Splint: abdominal binder when OOB Restrictions Weight Bearing Restrictions: No      Pain:  5/10 abdomen with movement   ADL: ADL ADL Comments: see Functional Assessment     See Function Navigator for Current Functional Status.   Therapy/Group: Individual Therapy  Lisa Roca 07/01/2015, 12:52 PM

## 2015-07-01 NOTE — Progress Notes (Signed)
Physical Therapy Session Note  Patient Details  Name: Dominique [name] MRN: [id number] Date of Birth: Dec 20, 1980  Today's Date: 07/01/2015 PT Individual Time: 0800-0900 Treatment Session 2: 1430-1540 PT Individual Time Calculation (min): 60 min  Treatment Session 2: 70 min  Short Term Goals: Week 1:  PT Short Term Goal 1 (Week 1): Pt will be able to tolerate OOB in recliner x 4 hours a day to promote upright tolerance PT Short Term Goal 2 (Week 1): Pt will be able to perform sitting balance activities x 10 min with mod A PT Short Term Goal 3 (Week 1): Pt will be able to initiate weightbearing through BLE with lift equipment  Skilled Therapeutic Interventions/Progress Updates:    Treatment Session 1: Pt received in bed, awoken by PT - reports reduction in stomach spasms and able to sleep last night. Therapeutic Activity - see function tab for bed mobility and transfer details. Pt increasing use of legs to roll R/L in bed with bedrails. W/C Management - see function tab for details - B UEs used with verbal cues to increase R UE use in order to drive straight. Neuromuscular Reeducation - In gym, PT instructs pt in dynamic sit weight shifts forward with hands planted on elevated mat x 30, progressed to weight shift forward with attempts to push through heels x 30, progressed to pushing through balls of feet with arms on armrests to simulate initiation of sit to stand. Pt is progressing in out of bed tolerance. Pt assisted back to bed with maxi-move, focus on initiation of pt directing transfer technique. Pt reports she wishes to be ambulating by the time she leaves - PT explains that pt must push herself with full effort every attempt to move and every rep of exercise/activity in order to achieve this goal. Pt left with all needs in reach and RN specializing in PICCs in room.   Treatment Session 2: Pt received in bed - agreeable to PT session. Therapeutic Exercise - Bed placed in max (20 degrees) reverse  trendellenburg with feet against foot board for partial weightbearing during B LE strengthening and rom exercises: mini-squats, heel raises, and marching with focus on support leg straight knee and pushing down through heel: 3 x 30 reps each. Therapeutic Activity - Pt agrees to trial sitting eob (to R side of bed). See function tab for details - partial roll and modified long sit (legs off eob as much as possible) with HOB elevated and use of bedrails to achieve sitting eob. Initially, pt very retropulsive upon sitting eob, but with cues to put hands on PT's thighs (PT in front), pt able to achieve static sit eob with SBA. Pt then participates in card game of "war" with PT Tech, use of tray for UE support as needed. Pt tearful with happy tears on being able to sit eob and not be scared. Pt returned to bed with all needs in reach at end of session. Continue per PT POC.    Therapy Documentation Precautions:  Precautions Precautions: Fall Precaution Comments: abdominal wound, colostomy Required Braces or Orthoses: Other Brace/Splint Other Brace/Splint: abdominal binder when OOB Restrictions Weight Bearing Restrictions: No Pain: Pain Assessment Pain Assessment: 0-10 Pain Score: 6  Pain Type: Acute pain Pain Location: Abdomen Pain Orientation: Mid Pain Descriptors / Indicators: Spasm Pain Onset: Sudden Patients Stated Pain Goal: 3 Pain Intervention(s): Rest Multiple Pain Sites: No Treatment Session 2: Pt denies pain.    See Function Navigator for Current Functional Status.   Therapy/Group: Individual  Therapy with Dominique [name], PT Tech as +2  Dominique [name] M 07/01/2015, 8:35 AM

## 2015-07-01 NOTE — Progress Notes (Signed)
Patient states she wears CPAP at home and Nasal here in the Hospital at night. Patient is refusing CPAP tonight. RT will continue to monitor as needed.

## 2015-07-01 NOTE — Progress Notes (Signed)
Dominique [name] is a 34 y.o. female 14-Mar-1981 [id number]  Subjective: No new complaints. No new problems. Slept well. Feeling OK.  Objective: Vital signs in last 24 hours: Temp:  [98.2 F (36.8 C)-98.7 F (37.1 C)] 98.7 F (37.1 C) (10/08 0508) Pulse Rate:  [94-110] 101 (10/08 0508) Resp:  [18-20] 18 (10/08 0508) BP: (100-124)/(40-63) 112/53 mmHg (10/08 0508) SpO2:  [86 %-99 %] 99 % (10/08 0508) Weight change:  Last BM Date:  (colostomy)  Intake/Output from previous day: 10/07 0701 - 10/08 0700 In: 480 [P.O.:480] Out: 1715 [Urine:1650; Stool:65] Last cbgs: CBG (last 3)   Recent Labs  06/30/15 1641 06/30/15 2104 07/01/15 0638  GLUCAP 103* 99 102*     Physical Exam General: No apparent distress.  Morbidly obese HEENT: not dry Lungs: Normal effort. Lungs clear to auscultation, no crackles or wheezes. Cardiovascular: Regular rate and rhythm, no edema Abdomen: S/NT/ND; BS(+) Musculoskeletal:  unchanged Neurological: No new neurological deficits Wounds: periph IV ok    Skin: R fifth dist finger is partially necrotic (dry) Mental state: Alert, oriented, cooperative    Lab Results: BMET    Component Value Date/Time   NA 138 06/27/2015 0543   K 3.4* 06/27/2015 0543   CL 104 06/27/2015 0543   CO2 29 06/27/2015 0543   GLUCOSE 91 06/27/2015 0543   BUN 21* 06/27/2015 0543   CREATININE 1.07* 06/27/2015 0543   CREATININE 0.52 12/29/2014 1557   CALCIUM 7.6* 06/27/2015 0543   GFRNONAA >60 06/27/2015 0543   GFRAA >60 06/27/2015 0543   CBC    Component Value Date/Time   WBC 6.5 06/27/2015 0543   RBC 3.36* 06/27/2015 0543   RBC 4.95 06/07/2015 0940   HGB 7.6* 06/27/2015 0543   HCT 25.6* 06/27/2015 0543   PLT 258 06/27/2015 0543   MCV 76.2* 06/27/2015 0543   MCH 22.6* 06/27/2015 0543   MCHC 29.7* 06/27/2015 0543   RDW 28.6* 06/27/2015 0543   LYMPHSABS 0.5* 06/27/2015 0543   MONOABS 1.0 06/27/2015 0543   EOSABS 0.2 06/27/2015 0543   BASOSABS 0.0 06/27/2015 0543     Studies/Results: No results found.  Medications: I have reviewed the patient's current medications.  Assessment/Plan:  1. Functional deficits secondary to debilitation/Crohn's disease/perforated left colon with peritonitis/exploratory laparotomy with drainage of intra-abdominal abscess partial colon resection with colostomy. Continue Pentasa 500 mg 4 times a day 2. DVT Prophylaxis/Anticoagulation: Subcutaneous heparin. Monitor platelet counts, currently noi signs of bleeding 3. Pain Management: Hydrocodone as needed 4. Acute blood loss anemia. Patient transfused follow-up CBC, 7.9gm this am 5. Neuropsych: This patient is capable of making decisions on her own behalf. 6. Skin/Wound Care: dehiscence noted at muco cutaneous jct of stoma- CCS seeing pt on M-W-F and WOC RN following 7. Fluids/Electrolytes/Nutrition: Strict I&O with follow-up chemistries 8. Acute renal insufficiency 2.64-2.84. Creatinine latest result 1.09. Follow-up chemistries stable 9. Acute respiratory failure/VDRF. Extubated 06/14/2015.propable obesity hypoventilation syndrome, cont PRN O2 10. SVT. Monitor heart rate. Lopressor 50 mg twice a day, pt also severely deconditioned with poor exercise tolerance 11. Chronic systolic congestive heart failure. Monitor for any signs of fluid overload. Lasix 40 mg twice a day, monitor intake and creat, no SOB, sats ok 13. Morbid obesity. Body mass index 67.33 14. Mild hypokalemia - will supplement  15. Severe hypoalbuminemia- supplement , this is contributing to delayed wound healing 16. 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

## 2015-07-01 NOTE — Progress Notes (Signed)
Occupational Therapy Session Note  Patient Details  Name: Bryana Froemming MRN: [id number] Date of Birth: 12/18/1980  Today's Date: 07/01/2015 OT Individual Time:  -    [address]   (30 min)      Short Term Goals: Week 1:  OT Short Term Goal 1 (Week 1): Pt will complete upper body bathing seated with Mod Assist OT Short Term Goal 2 (Week 1): Pt will complete transfer from EOB to w/c with Max Assist OT Short Term Goal 3 (Week 1): Pt will complete lower body bathing supine with Mod Assist OT Short Term Goal 4 (Week 1): Pt will complete toileting with Max Assist  OT Short Term Goal 5 (Week 1): Pt will demo ability to complete HEP for BUE with supervision Week 2:     Skilled Therapeutic Interventions/Progress Updates:    Engaged in UE with sho flex, abduction, doorknob, curls x10 for 1 set.  Pt.able to adjust bed and get to top of bed with OT pushing feet.  Pt left in bed with all needs in reach.   Therapy Documentation Precautions:  Precautions Precautions: Fall Precaution Comments: abdominal wound, colostomy Required Braces or Orthoses: Other Brace/Splint Other Brace/Splint: abdominal binder when OOB Restrictions Weight Bearing Restrictions: No General:   Vital Signs: Therapy Vitals Temp: 99 F (37.2 C) (RN notified) Temp Source: Oral Pulse Rate: 98 Resp: 16 BP: (!) 92/49 mmHg Patient Position (if appropriate): Lying Oxygen Therapy SpO2: 98 % O2 Device: Not Delivered Pain:  none   ADL: ADL ADL Comments: see Functional Assessment :   :    See Function Navigator for Current Functional Status.   Therapy/Group: Individual Therapy  Lisa Roca 07/01/2015, 5:06 PM

## 2015-07-02 ENCOUNTER — Inpatient Hospital Stay (HOSPITAL_COMMUNITY): Payer: Medicaid Other | Admitting: Physical Therapy

## 2015-07-02 LAB — URINE CULTURE

## 2015-07-02 LAB — GLUCOSE, CAPILLARY
GLUCOSE-CAPILLARY: 96 mg/dL (ref 65–99)
GLUCOSE-CAPILLARY: 110 mg/dL — AB (ref 65–99)
GLUCOSE-CAPILLARY: 114 mg/dL — AB (ref 65–99)
Glucose-Capillary: 102 mg/dL — ABNORMAL HIGH (ref 65–99)

## 2015-07-02 MED ORDER — NITROFURANTOIN MONOHYD MACRO 100 MG PO CAPS
100.0000 mg | ORAL_CAPSULE | Freq: Two times a day (BID) | ORAL | Status: AC
  Administered 2015-07-02 – 2015-07-09 (×14): 100 mg via ORAL
  Filled 2015-07-02 (×14): qty 1

## 2015-07-02 NOTE — Progress Notes (Signed)
Physical Therapy Session Note  Patient Details  Name: Dominique [name] MRN: [id number] Date of Birth: 11/20/1980  Today's Date: 07/02/2015 PT Individual Time: 1834-3735 PT Individual Time Calculation (min): 45 min   Short Term Goals: Week 1:  PT Short Term Goal 1 (Week 1): Pt will be able to tolerate OOB in recliner x 4 hours a day to promote upright tolerance PT Short Term Goal 2 (Week 1): Pt will be able to perform sitting balance activities x 10 min with mod A PT Short Term Goal 3 (Week 1): Pt will be able to initiate weightbearing through BLE with lift equipment  Skilled Therapeutic Interventions/Progress Updates:   Pt limited in sitting EOB in session secondary to positioning with buttocks beginning to slide off bed, assisted BIB. Pt motivated to participate in therapy throughout session. Pt would continue to benefit from skilled PT services to increase functional mobility.  Therapy Documentation Precautions:  Precautions Precautions: Fall Precaution Comments: abdominal wound, colostomy Required Braces or Orthoses: Other Brace/Splint Other Brace/Splint: abdominal binder when OOB Restrictions Weight Bearing Restrictions: No Mobility:  Pt performs bed mobility with total assist +2 with cues for weight shift and technique Other Treatments:  Pt educated on rehab plan, safety in mobility, and anticipated disposition. Pt performs sitting EOB x3' though sliding forward so assisted back supine. Soft tissue management educated on in all functional mobility tasks. OOB in chair x20 min. Pt performs rolling x6 in session. Pt performs hip abd/add isometrics, HS isometrics, LAQ AAROM, and ankle pumps 2x10 in sitting.    See Function Navigator for Current Functional Status.   Therapy/Group: Individual Therapy  Monia Pouch 07/02/2015, 4:50 PM

## 2015-07-02 NOTE — Progress Notes (Signed)
Pt. Refused cpap. Pt. States she is just going to wear her oxygen.

## 2015-07-02 NOTE — Progress Notes (Signed)
Dominique [name] is a 34 y.o. female Jul 01, 1981 [id number]  Subjective: . No new problems. Pt is asleep. Feeling OK.  Objective: Vital signs in last 24 hours: Temp:  [98.9 F (37.2 C)-99 F (37.2 C)] 98.9 F (37.2 C) (10/09 0514) Pulse Rate:  [88-98] 93 (10/09 0514) Resp:  [16-20] 20 (10/09 0514) BP: (92-127)/(49-73) 120/58 mmHg (10/09 0514) SpO2:  [98 %-99 %] 99 % (10/09 0514) Weight change:  Last BM Date:  (colostomy)  Intake/Output from previous day: 10/08 0701 - 10/09 0700 In: 960 [P.O.:960] Out: 2155 [Urine:2075; Stool:80] Last cbgs: CBG (last 3)   Recent Labs  07/01/15 2121 07/02/15 0643 07/02/15 1134  GLUCAP 122* 96 110*     Physical Exam General: No apparent distress.  Morbidly obese HEENT: not dry Lungs: Normal effort. Lungs clear to auscultation, no crackles or wheezes. Cardiovascular: Regular rate and rhythm, no edema Abdomen: S/NT/ND; BS(+) Musculoskeletal:  unchanged Neurological: No new neurological deficits Wounds: periph IV ok    Skin: R fifth dist finger is partially necrotic (dry) Mental state: asleep    Lab Results: BMET    Component Value Date/Time   NA 138 06/27/2015 0543   K 3.4* 06/27/2015 0543   CL 104 06/27/2015 0543   CO2 29 06/27/2015 0543   GLUCOSE 91 06/27/2015 0543   BUN 21* 06/27/2015 0543   CREATININE 1.07* 06/27/2015 0543   CREATININE 0.52 12/29/2014 1557   CALCIUM 7.6* 06/27/2015 0543   GFRNONAA >60 06/27/2015 0543   GFRAA >60 06/27/2015 0543   CBC    Component Value Date/Time   WBC 6.5 06/27/2015 0543   RBC 3.36* 06/27/2015 0543   RBC 4.95 06/07/2015 0940   HGB 7.6* 06/27/2015 0543   HCT 25.6* 06/27/2015 0543   PLT 258 06/27/2015 0543   MCV 76.2* 06/27/2015 0543   MCH 22.6* 06/27/2015 0543   MCHC 29.7* 06/27/2015 0543   RDW 28.6* 06/27/2015 0543   LYMPHSABS 0.5* 06/27/2015 0543   MONOABS 1.0 06/27/2015 0543   EOSABS 0.2 06/27/2015 0543   BASOSABS 0.0 06/27/2015 0543    Studies/Results: No results  found.  Medications: I have reviewed the patient's current medications.  Assessment/Plan:  1. Functional deficits secondary to debilitation/Crohn's disease/perforated left colon with peritonitis/exploratory laparotomy with drainage of intra-abdominal abscess partial colon resection with colostomy. Continue Pentasa 500 mg 4 times a day 2. DVT Prophylaxis/Anticoagulation: Subcutaneous heparin. Monitor platelet counts, currently noi signs of bleeding 3. Pain Management: Hydrocodone as needed 4. Acute blood loss anemia. Patient transfused follow-up CBC, 7.9gm this am 5. Neuropsych: This patient is capable of making decisions on her own behalf. 6. Skin/Wound Care: dehiscence noted at muco cutaneous jct of stoma- CCS seeing pt on M-W-F and WOC RN following 7. Fluids/Electrolytes/Nutrition: Strict I&O with follow-up chemistries 8. Acute renal insufficiency 2.64-2.84. Creatinine latest result 1.09. Follow-up chemistries stable 9. Acute respiratory failure/VDRF. Extubated 06/14/2015.propable obesity hypoventilation syndrome, cont PRN O2 10. SVT. Monitor heart rate. Lopressor 50 mg twice a day, pt also severely deconditioned with poor exercise tolerance 11. Chronic systolic congestive heart failure. Monitor for any signs of fluid overload. Lasix 40 mg twice a day, monitor intake and creat, no SOB, sats ok 13. Morbid obesity. Body mass index 67.33 14. Mild hypokalemia - will supplement  15. Severe hypoalbuminemia- supplement , this is contributing to delayed wound healing 16. 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

## 2015-07-03 ENCOUNTER — Inpatient Hospital Stay (HOSPITAL_COMMUNITY): Payer: Medicaid Other | Admitting: Physical Therapy

## 2015-07-03 ENCOUNTER — Inpatient Hospital Stay (HOSPITAL_COMMUNITY): Payer: Medicaid Other

## 2015-07-03 ENCOUNTER — Encounter: Payer: Medicaid Other | Admitting: Nurse Practitioner

## 2015-07-03 DIAGNOSIS — K50013 Crohn's disease of small intestine with fistula: Secondary | ICD-10-CM

## 2015-07-03 DIAGNOSIS — F4323 Adjustment disorder with mixed anxiety and depressed mood: Secondary | ICD-10-CM | POA: Insufficient documentation

## 2015-07-03 LAB — CBC
HCT: 25.8 % — ABNORMAL LOW (ref 36.0–46.0)
Hemoglobin: 8 g/dL — ABNORMAL LOW (ref 12.0–15.0)
MCH: 24.6 pg — AB (ref 26.0–34.0)
MCHC: 31 g/dL (ref 30.0–36.0)
MCV: 79.4 fL (ref 78.0–100.0)
PLATELETS: 306 10*3/uL (ref 150–400)
RBC: 3.25 MIL/uL — AB (ref 3.87–5.11)
RDW: 26.7 % — AB (ref 11.5–15.5)
WBC: 11.1 10*3/uL — ABNORMAL HIGH (ref 4.0–10.5)

## 2015-07-03 LAB — GLUCOSE, CAPILLARY
GLUCOSE-CAPILLARY: 99 mg/dL (ref 65–99)
GLUCOSE-CAPILLARY: 119 mg/dL — AB (ref 65–99)
Glucose-Capillary: 95 mg/dL (ref 65–99)
Glucose-Capillary: 94 mg/dL (ref 65–99)

## 2015-07-03 NOTE — Progress Notes (Signed)
Physical Therapy Session Note  Patient Details  Name: Dominique [name] MRN: [id number] Date of Birth: October 25, 1980  Today's Date: 07/03/2015 PT Individual Time: 1500-1630 PT Individual Time Calculation (min): 90 min   Short Term Goals: Week 1:  PT Short Term Goal 1 (Week 1): Pt will be able to tolerate OOB in recliner x 4 hours a day to promote upright tolerance PT Short Term Goal 2 (Week 1): Pt will be able to perform sitting balance activities x 10 min with mod A PT Short Term Goal 3 (Week 1): Pt will be able to initiate weightbearing through BLE with lift equipment  Skilled Therapeutic Interventions/Progress Updates:   Pt received in bed;  No reports of abdominal cramping/spasms today. Pt performed rolling multiple times to L and R with pt initiating LE flexion and hip rotation but requires total A to complete roll to place maxi move sling.  Once set up in sling pt transferred to w/c with maxi move.  Pt unable to perform w/c mobility today due to slouched posture in w/c when seated on 4" cushion.  Transferred to day room in w/c total A.  Pt transferred from w/c > supine on tilt table with maxi move total A.  Pt set up on tilt table with straps at upper chest, across hips and at knees.  Pt slowly inclined to 50 deg with LE on foot board to provide increased WB through LE; pt able to stay in position for 3 minutes total before requesting to return to supine.  Transferred back to w/c without cushion with maxi move total A with w/c tipped for optimal positioning in w/c.  Returned to room and transferred back to bed and supine total A with maxi move.  Pt performed rolling with max-total A to remove sling.  Repositioned to head of bed with +2 A.  Pt left with all items within reach and heating kpad on abdomen.  Pt very pleased with standing frame and would like to try it again.   Therapy Documentation Precautions:  Precautions Precautions: Fall Precaution Comments: abdominal wound, colostomy Required  Braces or Orthoses: Other Brace/Splint Other Brace/Splint: abdominal binder when OOB Restrictions Weight Bearing Restrictions: No Vital Signs: Therapy Vitals Temp: 97.9 F (36.6 C) Temp Source: Oral Pulse Rate: 93 Resp: 18 BP: (!) 104/58 mmHg Patient Position (if appropriate): Lying Oxygen Therapy SpO2: 98 % O2 Device: Not Delivered Pain: Pain Assessment Pain Score: 4  Pain Type: Acute pain Pain Location: Abdomen Pain Descriptors / Indicators: Aching Pain Onset: With Activity Pain Intervention(s): Splinting;Repositioned   See Function Navigator for Current Functional Status.   Therapy/Group: Individual Therapy  Raylene Everts [place] 07/03/2015, 5:12 PM

## 2015-07-03 NOTE — Progress Notes (Signed)
Occupational Therapy Session Note  Patient Details  Name: Dominique [name] MRN: [id number] Date of Birth: 23-Jul-1981  Today's Date: 07/03/2015 OT Individual Time: 1100-1200 OT Individual Time Calculation (min): 60 min    Short Term Goals: Week 1:  OT Short Term Goal 1 (Week 1): Pt will complete upper body bathing seated with Mod Assist OT Short Term Goal 2 (Week 1): Pt will complete transfer from EOB to w/c with Max Assist OT Short Term Goal 3 (Week 1): Pt will complete lower body bathing supine with Mod Assist OT Short Term Goal 4 (Week 1): Pt will complete toileting with Max Assist  OT Short Term Goal 5 (Week 1): Pt will demo ability to complete HEP for BUE with supervision  Skilled Therapeutic Interventions/Progress Updates: ADL-retraining with focus on seated upper body bathing, assisted 2- weight-shifting while seated in w/c and improved lower body bathing.   Pt received seated in w/c and unable to reposition herself from posterior pelvic tilt.   After replacing elevating leg rests to improve security at LE and re-ed on use of armrest, pt was still unable to reposition.    OT and helper provided mechanical assist to improve sitting posture to facilitate seated upper body bathing.   Pt remains without clothing from home and uses only hospital gowns currently.   Pt completes bathing upper body with setup to wash and min assist to apply deodorant.   Pt requires mech lift transfer to bed for supine assisted bathing with noted improved active use of legs during bed mobility although still requiring +2 assist to roll from right to left d/t pain from wound.   Pt requires total assist for lower body bathing but is now able to operate hospital bed unassisted when given controller and completed bed mobility toward head of bed using trendelenburg function and mod assist X1 to stabilize her feet as she pushed toward head of bed.   Pt left with call light and phone within reach at end of session.     Therapy  Documentation Precautions:  Precautions Precautions: Fall Precaution Comments: abdominal wound, colostomy Required Braces or Orthoses: Other Brace/Splint Other Brace/Splint: abdominal binder when OOB Restrictions Weight Bearing Restrictions: No  Pain: Pain Assessment Pain Assessment: 0-10 Pain Score: 3  Pain Type: Acute pain Pain Location: Abdomen Pain Descriptors / Indicators: Aching Pain Frequency: Constant Pain Onset: On-going Patients Stated Pain Goal: 4 Pain Intervention(s): Medication (See eMAR)  ADL: ADL ADL Comments: see Functional Assessment  See Function Navigator for Current Functional Status.   Therapy/Group: Individual Therapy   Second session: Time: 1400-1440 Time Calculation (min):  40 min  Pain Assessment: No/denies pain  Skilled Therapeutic Interventions: Therapeutic exercise at bed level with focus on BUE strengthening, HEP instruction using thera-band (orange, grade II resistance).  Pt completes BUE exercise: PNF D2 pattern (upper body), arm curl, arm extension, supine scapular chest pull, 10 reps each, 2 sets with extra time and rest breaks.   During rest periods OT calls pt's mother regarding need for clothing (bra, shoes, regular clothing).   Mother confirms that pt's sister will bring clothing in this evening.   OT also provides padding to w/c leg rests d/t contact/pressure noted at lateral thighs.   See FIM for current functional status  Therapy/Group: Individual Therapy  [place] 07/03/2015, 2:55 PM

## 2015-07-03 NOTE — Progress Notes (Signed)
Subjective/Complaints: Discussed UA results, no sx from UTI Pt says one of the surgeons told her she didn't have Crohns, reviewed bx results which showed yes ROS:  No CP or SOB, no increased limb swelling Objective: Vital Signs: Blood pressure 104/56, pulse 91, temperature 98 F (36.7 C), temperature source Oral, resp. rate 18, SpO2 98 %. No results found. Results for orders placed or performed during the hospital encounter of 06/26/15 (from the past 72 hour(s))  Glucose, capillary     Status: None   Collection Time: 06/30/15 12:05 PM  Result Value Ref Range   Glucose-Capillary 87 65 - 99 mg/dL   Comment 1 Notify RN   Urinalysis, Routine w reflex microscopic (not at [facility])     Status: Abnormal   Collection Time: 06/30/15  1:01 PM  Result Value Ref Range   Color, Urine YELLOW YELLOW   APPearance TURBID (A) CLEAR   Specific Gravity, Urine 1.015 1.005 - 1.030   pH 5.0 5.0 - 8.0   Glucose, UA NEGATIVE NEGATIVE mg/dL   Hgb urine dipstick SMALL (A) NEGATIVE   Bilirubin Urine NEGATIVE NEGATIVE   Ketones, ur NEGATIVE NEGATIVE mg/dL   Protein, ur NEGATIVE NEGATIVE mg/dL   Urobilinogen, UA 0.2 0.0 - 1.0 mg/dL   Nitrite POSITIVE (A) NEGATIVE   Leukocytes, UA LARGE (A) NEGATIVE  Urine culture     Status: None   Collection Time: 06/30/15  1:01 PM  Result Value Ref Range   Specimen Description URINE, CATHETERIZED    Special Requests NONE    Culture >=100,000 COLONIES/mL ESCHERICHIA COLI    Report Status 07/02/2015 FINAL    Organism ID, Bacteria ESCHERICHIA COLI       Susceptibility   Escherichia coli - MIC*    AMPICILLIN >=32 RESISTANT Resistant     CEFAZOLIN 8 SENSITIVE Sensitive     CEFTRIAXONE <=1 SENSITIVE Sensitive     CIPROFLOXACIN >=4 RESISTANT Resistant     GENTAMICIN >=16 RESISTANT Resistant     IMIPENEM <=0.25 SENSITIVE Sensitive     NITROFURANTOIN <=16 SENSITIVE Sensitive     TRIMETH/SULFA >=320 RESISTANT Resistant     AMPICILLIN/SULBACTAM >=32 RESISTANT Resistant    PIP/TAZO <=4 SENSITIVE Sensitive     * >=100,000 COLONIES/mL ESCHERICHIA COLI  Urine microscopic-add on     Status: Abnormal   Collection Time: 06/30/15  1:01 PM  Result Value Ref Range   Squamous Epithelial / LPF RARE RARE   WBC, UA TOO NUMEROUS TO COUNT <3 WBC/hpf   RBC / HPF 0-2 <3 RBC/hpf   Bacteria, UA MANY (A) RARE  Glucose, capillary     Status: Abnormal   Collection Time: 06/30/15  4:41 PM  Result Value Ref Range   Glucose-Capillary 103 (H) 65 - 99 mg/dL   Comment 1 Notify RN   Glucose, capillary     Status: None   Collection Time: 06/30/15  9:04 PM  Result Value Ref Range   Glucose-Capillary 99 65 - 99 mg/dL  Glucose, capillary     Status: Abnormal   Collection Time: 07/01/15  6:38 AM  Result Value Ref Range   Glucose-Capillary 102 (H) 65 - 99 mg/dL  Glucose, capillary     Status: None   Collection Time: 07/01/15 11:41 AM  Result Value Ref Range   Glucose-Capillary 91 65 - 99 mg/dL   Comment 1 Notify RN   Glucose, capillary     Status: None   Collection Time: 07/01/15  4:26 PM  Result Value Ref Range   Glucose-Capillary  93 65 - 99 mg/dL   Comment 1 Notify RN   Glucose, capillary     Status: Abnormal   Collection Time: 07/01/15  9:21 PM  Result Value Ref Range   Glucose-Capillary 122 (H) 65 - 99 mg/dL  Glucose, capillary     Status: None   Collection Time: 07/02/15  6:43 AM  Result Value Ref Range   Glucose-Capillary 96 65 - 99 mg/dL  Glucose, capillary     Status: Abnormal   Collection Time: 07/02/15 11:34 AM  Result Value Ref Range   Glucose-Capillary 110 (H) 65 - 99 mg/dL   Comment 1 Notify RN   Glucose, capillary     Status: Abnormal   Collection Time: 07/02/15  4:30 PM  Result Value Ref Range   Glucose-Capillary 114 (H) 65 - 99 mg/dL   Comment 1 Notify RN   Glucose, capillary     Status: Abnormal   Collection Time: 07/02/15  9:12 PM  Result Value Ref Range   Glucose-Capillary 102 (H) 65 - 99 mg/dL  Glucose, capillary     Status: None   Collection  Time: 07/03/15  6:27 AM  Result Value Ref Range   Glucose-Capillary 95 65 - 99 mg/dL     HEENT: light sensitive Right eye Cardio: RRR and no murmur Resp: CTA B/L and unlabored GI: BS positive and LLQ colostomy, midline open abd wound with Wet to Dry ,  Extremity:  No Edema Skin:   Wound see above, no heel ulceration Neuro: Alert/Oriented and Abnormal Motor 5/5 in BUE, 3- bilateral HF,able to lift heel off bed ~3 inches when supine 4- Knee ext and ankle DF/PF Musc/Skel:  Other no pain with UE and LE ROM Gen NAD   Assessment/Plan: 1. Functional deficits secondary to debilitation/Crohn's disease/perforated left colon with peritonitis/exploratory laparotomy with drainage of intra-abdominal abscess partial colon resection with colostomy which require 3+ hours per day of interdisciplinary therapy in a comprehensive inpatient rehab setting. Physiatrist is providing close team supervision and 24 hour management of active medical problems listed below. Physiatrist and rehab team continue to assess barriers to discharge/monitor patient progress toward functional and medical goals.   FIM: Function - Bathing Position: Bed Body parts bathed by patient: Right arm, Left arm Body parts bathed by helper: Left upper leg, Right lower leg, Left lower leg, Right upper leg, Buttocks, Front perineal area, Abdomen, Back Bathing not applicable: Abdomen, Buttocks, Front perineal area (peri care done prior to OT session) Assist Level: 2 helpers  Function- Upper Body Dressing/Undressing What is the patient wearing?: Hospital gown Function - Lower Body Dressing/Undressing What is the patient wearing?: [place] activity did not occur: N/A Toileting steps completed by patient: Adjust clothing prior to toileting, Performs perineal hygiene, Adjust clothing after toileting Toileting steps completed by helper: Adjust clothing prior to toileting, Performs perineal hygiene,  Adjust clothing after toileting Assist level: Touching or steadying assistance (Pt.75%)  Function - Toilet Transfers Toilet transfer activity did not occur: N/A Toilet transfer assistive device: Mechanical lift Mechanical lift: [place] Assist level to toilet: 2 helpers Assist level from toilet: 2 helpers  Function - Chair/bed transfer Chair/bed transfer activity did not occur: Safety/medical concerns Chair/bed transfer method: Other Chair/bed transfer assist level: dependent (Pt equals 0%) Chair/bed transfer assistive device: Mechanical lift Mechanical lift: [place] Chair/bed transfer details: Verbal cues for safe use of DME/AE, Manual facilitation for placement  Function - Locomotion: Wheelchair Will patient use wheelchair at discharge?: Yes Type:  (tbd)  Wheelchair activity did not occur: Safety/medical concerns (debility and awaiting appropriate equipment) Max wheelchair distance: 46' Assist Level: Touching or steadying assistance (Pt > 75%) Assist Level: Touching or steadying assistance (Pt > 75%) Turns around,maneuvers to table,bed, and toilet,negotiates 3% grade,maneuvers on rugs and over doorsills: No Function - Locomotion: Ambulation Ambulation activity did not occur: Safety/medical concerns (significant debility)  Function - Comprehension Comprehension: Auditory Comprehension assist level: Understands complex 90% of the time/cues 10% of the time  Function - Expression Expression: Verbal Expression assist level: Expresses basic needs/ideas: With no assist  Function - Social Interaction Social Interaction assist level: Interacts appropriately 90% of the time - Needs monitoring or encouragement for participation or interaction.  Function - Problem Solving Problem solving assist level: Solves complex 90% of the time/cues < 10% of the time  Function - Memory Memory assist level: Complete Independence: No helper Patient normally able to recall (first 3 days only):  Current season, Location of own room, Staff names and faces, That he or she is in a hospital   Medical Problem List and Plan: 1. Functional deficits secondary to debilitation/Crohn's disease/perforated left colon with peritonitis/exploratory laparotomy with drainage of intra-abdominal abscess partial colon resection with colostomy. Continue Pentasa 500 mg 4 times a day 2.  DVT Prophylaxis/Anticoagulation: Subcutaneous heparin. Monitor platelet counts, currently noi signs of bleeding 3. Pain Management: Hydrocodone as needed 4. Acute blood loss anemia. Monitor while on heparin, recheck in am 5. Neuropsych: This patient is capable of making decisions on her own behalf. 6. Skin/Wound Care: dehiscence noted at muco cutaneous jct of stoma- CCS seeing pt on M-W-F and WOC RN following 7. Fluids/Electrolytes/Nutrition: Strict I&O with follow-up chemistries 8. Acute renal insufficiency 2.64-2.84. Creatinine latest result 1.09. Follow-up chemistries stable 9. Acute respiratory failure/VDRF. Extubated 06/14/2015.propable obesity hypoventilation syndrome, cont PRN O2 10. SVT. Monitor heart rate. Lopressor 50 mg twice a day, pt also severely deconditioned with poor exercise tolerance, am HR 91 11. Chronic systolic congestive heart failure. Monitor for any signs of fluid overload. Lasix 40 mg twice a day, monitor intake and creat, no SOB, sats ok 12.  UTI ecoli- S to nitro, cont abx  13. Morbid obesity. Body mass index 67.33 14.  Mild hypokalemia - will supplement  15.  Severe hypoalbuminemia- supplement , this is contributing to delayed wound healing   LOS (Days) 7 A FACE TO FACE EVALUATION WAS PERFORMED  KIRSTEINS,ANDREW E 07/03/2015, 7:55 AM

## 2015-07-03 NOTE — Consult Note (Signed)
WOC follow-up: Ostomy pouching: Current pouch did not leak since Friday. Applied 2 piece pouch with barrier ring. Stoma is becoming red and moist with decreasing amt slough, slightly above skin level, 1 3/4 inches, mucutaneous separation occurring to 50% of peristomal edges, from 6:00 o'clock to 12:00 o'clock with mod amt thick tan pus and some odor.   Mod amt formed brown stool in pouch. Education provided: Demonstrated pouch change using 2 piece pouch and a barrier ring to attempt to maintain seal. Applied strip of Aquacel packing into separation edges of stoma. Enrolled patient in [employer] program: No Ostomy supplies at the bedside for nurse use. Pt watched procedure and asked appropriate questions. She will need total assistance with pouching upon discharge until this complex problem is resolved.  Wound type: Full thickness midline abd wound, CCS following for assessment and plan of care.  Remains with some thick tan pus in the lower wound.They have ordered moist gauze packing 4Xday by bedside nurses with Mepitel in the inner wound. Julien Girt MSN, RN, CWOCN, CWCN-AP, CNS

## 2015-07-03 NOTE — Consult Note (Signed)
WOC follow-up: Requested to re-apply Vac to abd wound.  Pt will be in rehab therapy until late afternoon.  Discussed plan of care with CCS team and will plan to apply Vac dressing tomorrow AM at 0800.  They will assess ostomy stoma at that time.  Informed bedside nurse to keep free from therapy schedule at that time tomorrow AM. Julien Girt MSN, RN, [place], [place], [place]

## 2015-07-03 NOTE — Progress Notes (Signed)
Surgeons came to assess patients abdominal wound.  They ordered for a wound vac to be placed by [facility] nurse.  Until Vac can be placed, surgeons suggested continuing packing the wound with wet to dry dressings as we were doing before.  Changed dressing as ordered.  Noticed colostomy leaking around 11 oclock.  Paged [place], [facility] nurse, about the consult and for input on changing colostomy.  Changed ostomy pouch per her instructions, packing the wound around the ostomy with aquacel.  Will continue to monitor.  Brita Romp, RN

## 2015-07-03 NOTE — Progress Notes (Signed)
Physical Therapy Session Note  Patient Details  Name: Dominique [name] MRN: [id number] Date of Birth: 05/21/1981  Today's Date: 07/03/2015 PT Individual Time: 0933-1030 PT Individual Time Calculation (min): 57 min   Short Term Goals: Week 1:  PT Short Term Goal 1 (Week 1): Pt will be able to tolerate OOB in recliner x 4 hours a day to promote upright tolerance PT Short Term Goal 2 (Week 1): Pt will be able to perform sitting balance activities x 10 min with mod A PT Short Term Goal 3 (Week 1): Pt will be able to initiate weightbearing through BLE with lift equipment  Skilled Therapeutic Interventions/Progress Updates:    Patient rolled in bed total assist +2 using rails and guiding for technique to allow binder repositioning and placement of maximove sling.  Patient lifted to wheelchair and positioned with assist and pillow and leg rests modified for comfort.  Propelled wheelchair 66' mod assist due to difficulty reaching wheels due to soft tissue approximation.  Patient in gym with RW in front practiced anterior weight shift and pushing through LE's x 10 reps 5 sec hold sheet at back for support and weight shift.  Patient sat edge of seat for binder readjustment.  Assisted in wheelchair back to room and tech in room to await OT session.    Therapy Documentation Precautions:  Precautions Precautions: Fall Precaution Comments: abdominal wound, colostomy Required Braces or Orthoses: Other Brace/Splint Other Brace/Splint: abdominal binder when OOB Restrictions Weight Bearing Restrictions: No Pain: Pain Assessment Pain Assessment: 0-10 Pain Score: 4  Pain Type: Acute pain Pain Location: Abdomen Pain Descriptors / Indicators: Aching Pain Frequency: Constant Pain Onset: With Activity Patients Stated Pain Goal: 4 Pain Intervention(s): Splinting;Repositioned   See Function Navigator for Current Functional Status.   Therapy/Group: Individual Therapy  [place],  [place] 07/03/2015  07/03/2015, 4:50 PM

## 2015-07-03 NOTE — Progress Notes (Signed)
Recreational Therapy Session Note  Patient Details  Name: Dominique [name] MRN: [id number] Date of Birth: 04-Oct-1980 Today's Date: 07/03/2015  Pt participated in animal assisted activity/therapy bed level with supervision.   [place] 07/03/2015, 3:25 PM

## 2015-07-03 NOTE — Consult Note (Signed)
INITIAL PSYCHODIAGNOSTIC EXAMINATION - [facility]IDENTIAL [facility]   [name] is a 34 year old, right-handed woman, who was seen for an initial psychodiagnostic examination to evaluate her emotional state in the setting of physical deconditioning.  According to her medical record, she was admitted on 06/04/15 with history of asthma, chronic systolic congestive heart failure, and recent recurrent colitis since January, 2016.  She presented with severe abdominal pain with occasional vomiting and nausea.  Through evaluation, she was diagnosed with Crohn's colitis.  She also underwent exploratory laparotomy drainage of intra-abdominal abscess, partial colon resection and application of wound VAC on 06/09/15 with a reexploration of abdomen with closure of creation of colostomy on 06/11/15.  She has since had renal insufficiency and was deemed appropriate for inpatient rehabilitation.  She was referred for the current neuropsychological evaluation owing to presence of anxious mood that is adversely impacting her participation in therapies at times.    Today's session was split between observing her in occupational therapy and working with her individually.    [name] acknowledged significant anxious mood while on the unit, which seems to mostly be driven by fear of falling.  She said that lacks trust in staff to catch her, which is related to them being "so much smaller" than she is; her fear is also driven by not trusting the strength of her own legs and arms.  She did note that she feels "better" today, after her occupational therapists constructed a makeshift bra for her, which allowed her weight distribution to be centered, improving her ability to balance when sitting up.  She clarified that her bra (and other clothes) were misplaced when she was moved to the current unit; she plans to ask her mother to bring another bra this weekend.  When she was being observed today, [name]  achieved success in sitting without anxious mood and in practicing leaning forward, though she was not willing to attempt to stand.  During her occupational therapy session, when attempting to lean forward, her therapist sat directly in front of her making constant eye contact.  Later, in our individual session, [name] said that his eye contact and calm voice allowed her to feel more comfortable and she expressed excitement over her accomplishments from the day.  Time was spent during the individual session processing her anxious mood, including examining thought processes behind her anxiety, and in practicing techniques to reduce anxiety.  The techniques that were practiced included: deep breathing, thought stopping, and narrative therapy; she was encouraged to develop her story of recovery so that she will be proud to share it with her daughter someday and so that it might inspire her daughter to face her own challenges in the future.  [name] seemed to resonate with that.  She also stated that she has been and continues to use prayer to help her cope.  Of note, she was recently prescribed Xanax to help with anxious mood and she took her first dose during the current session.  In addition to anxious mood, she acknowledged the presence of depression and noted that she has struggled with the fact that she will miss her daughter's 5th birthday.  There was not time during the current session to more completely address issues of depression.   IMPRESSIONS AND RECOMMENDATIONS:  [name] seems to be experiencing anxiety and depression related to adjustment to illness.  Both likely require intervention.  Given that anxious mood was more acutely impacting her participation in therapy, it  was the focus of the session today.  A follow-up session with the neuropsychologist is recommended to more completely address depressive feelings and to follow-up on effectiveness of anxiety reduction techniques practiced today.  In  the interim, staff members are encouraged to remind her to practice deep breathing and thought stopping when she is anxious and to remind her to make her story of recovery one that she will be proud to tell her daughter.    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  Clinical Neuropsychologist

## 2015-07-03 NOTE — Progress Notes (Signed)
Patient ID: Dominique [name], female   DOB: [birth year], 34 y.o.   MRN: [id number]     [facility]      [address] [place], [place] [address]    Phone: [phone] FAX: [phone]     Subjective:  No issues.  Doing ok with dressing changes.   Objective:  Vital signs:  Filed Vitals:   07/01/15 1415 07/01/15 2035 07/02/15 0514 07/03/15 0514  BP: 92/49 127/73 120/58 104/56  Pulse: 98 88 93 91  Temp: 99 F (37.2 C)  98.9 F (37.2 C) 98 F (36.7 C)  TempSrc: Oral  Oral Oral  Resp: 16  20 18   SpO2: 98%  99% 98%    Last BM Date: 07/03/15  Intake/Output   Yesterday:  10/09 0701 - 10/10 0700 In: 600 [P.O.:600] Out: 2180 [Urine:2150; Stool:30] This shift:  Total I/O In: 240 [P.O.:240] Out: -    Physical Exam: General: Pt awake/alert/oriented x4 in no acute distress  Skin: midline wound-fascia has dehisced, I removed the PDS sutures as they were hanging loosely.  LLQ ostomy-stool.     Problem List:   Principal Problem:   Debilitated Active Problems:   Morbid obesity- BMI 67.5   OSA (obstructive sleep apnea)   Crohn's disease (HCC)   Chronic systolic CHF (congestive heart failure) (HCC)   Adjustment disorder with mixed anxiety and depressed mood    Results:   Labs: Results for orders placed or performed during the hospital encounter of 06/26/15 (from the past 48 hour(s))  Glucose, capillary     Status: None   Collection Time: 07/01/15  4:26 PM  Result Value Ref Range   Glucose-Capillary 93 65 - 99 mg/dL   Comment 1 Notify RN   Glucose, capillary     Status: Abnormal   Collection Time: 07/01/15  9:21 PM  Result Value Ref Range   Glucose-Capillary 122 (H) 65 - 99 mg/dL  Glucose, capillary     Status: None   Collection Time: 07/02/15  6:43 AM  Result Value Ref Range   Glucose-Capillary 96 65 - 99 mg/dL  Glucose, capillary     Status: Abnormal   Collection Time: 07/02/15 11:34 AM  Result Value Ref Range    Glucose-Capillary 110 (H) 65 - 99 mg/dL   Comment 1 Notify RN   Glucose, capillary     Status: Abnormal   Collection Time: 07/02/15  4:30 PM  Result Value Ref Range   Glucose-Capillary 114 (H) 65 - 99 mg/dL   Comment 1 Notify RN   Glucose, capillary     Status: Abnormal   Collection Time: 07/02/15  9:12 PM  Result Value Ref Range   Glucose-Capillary 102 (H) 65 - 99 mg/dL  Glucose, capillary     Status: None   Collection Time: 07/03/15  6:27 AM  Result Value Ref Range   Glucose-Capillary 95 65 - 99 mg/dL  CBC     Status: Abnormal   Collection Time: 07/03/15  9:20 AM  Result Value Ref Range   WBC 11.1 (H) 4.0 - 10.5 K/uL   RBC 3.25 (L) 3.87 - 5.11 MIL/uL   Hemoglobin 8.0 (L) 12.0 - 15.0 g/dL   HCT 25.8 (L) 36.0 - 46.0 %   MCV 79.4 78.0 - 100.0 fL   MCH 24.6 (L) 26.0 - 34.0 pg   MCHC 31.0 30.0 - 36.0 g/dL   RDW 26.7 (H) 11.5 - 15.5 %   Platelets 306 150 - 400 K/uL  Glucose, capillary     Status: None   Collection Time: 07/03/15 12:12 PM  Result Value Ref Range   Glucose-Capillary 99 65 - 99 mg/dL   Comment 1 Notify RN     Imaging / Studies: No results found.  Medications / Allergies:  Scheduled Meds: . ALPRAZolam  0.25 mg Oral BID WC  . feeding supplement (ENSURE ENLIVE)  237 mL Oral TID BM  . furosemide  40 mg Oral BID  . heparin  5,000 Units Subcutaneous 3 times per day  . insulin aspart  0-15 Units Subcutaneous TID WC  . metoCLOPramide  5 mg Oral TID AC  . metoprolol tartrate  50 mg Oral BID  . nitrofurantoin (macrocrystal-monohydrate)  100 mg Oral Q12H  . pantoprazole  40 mg Oral Daily  . phenazopyridine  200 mg Oral TID WC  . polyethylene glycol  17 g Oral Daily  . potassium chloride  20 mEq Oral Daily  . protein supplement  1 scoop Oral TID WC  . saccharomyces boulardii  250 mg Oral BID   Continuous Infusions:  PRN Meds:.guaiFENesin, HYDROcodone-acetaminophen, methocarbamol, ondansetron **OR** ondansetron (ZOFRAN) IV, simethicone, sodium chloride,  sorbitol  Antibiotics: Anti-infectives    Start     Dose/Rate Route Frequency Ordered Stop   06/30/15 2000  ciprofloxacin (CIPRO) tablet 250 mg  Status:  Discontinued     250 mg Oral 2 times daily 06/30/15 1716 07/02/15 1418       Assessment/Plan Perforated descending colon, colitis POD 20,22 s/p ex lap with DRAINAGE OF INTRA-ABDOMINAL ABSCESS, PARTIAL COLON RESECTION, re-exploration and creation of colostomy and abdominal closure---Dr. Lanier Clam - wound has cleaned up nicely.  Will place a wound VAC(M-W-F) -watch colostomy, viable, but has mucocutaneous separation  -miralax -up with therapies -abd binder -will continue to follow wound/ostomy(MWF). Please call in the interim if needed.    Erby Pian, [facility] Pager [phone]) For consults and floor pages call [phone](7A-4:30P)  07/03/2015 12:41 PM

## 2015-07-04 ENCOUNTER — Inpatient Hospital Stay (HOSPITAL_COMMUNITY): Payer: Medicaid Other | Admitting: Occupational Therapy

## 2015-07-04 ENCOUNTER — Inpatient Hospital Stay (HOSPITAL_COMMUNITY): Payer: Medicaid Other | Admitting: *Deleted

## 2015-07-04 ENCOUNTER — Inpatient Hospital Stay (HOSPITAL_COMMUNITY): Payer: Medicaid Other

## 2015-07-04 LAB — GLUCOSE, CAPILLARY
GLUCOSE-CAPILLARY: 93 mg/dL (ref 65–99)
GLUCOSE-CAPILLARY: 79 mg/dL (ref 65–99)
Glucose-Capillary: 108 mg/dL — ABNORMAL HIGH (ref 65–99)
Glucose-Capillary: 113 mg/dL — ABNORMAL HIGH (ref 65–99)

## 2015-07-04 NOTE — Progress Notes (Signed)
Physical Therapy Weekly Progress Note  Patient Details  Name: Dominique [name] MRN: [id number] Date of Birth: 06-Dec-1980  Beginning of progress report period: June 27, 2015 End of progress report period: July 04, 2015  Patient has made slow progress and has met 1 of 3 short term goals.  Pt continues to require +2 for rolling in bed and bed mobility and +2 and use of lift equipment to perform transfers bed <> w/c but has initiated w/c mobility training in manual w/c and has participated in supported standing/WB on tilt table to 50 deg.  Pt participation continues to be limited by fear of falling/anxiety and pain related to her abdominal wound.  Patient continues to demonstrate the following deficits: impaired activity tolerance/endurance, impaired strength, postural control, balance, impaired gait and therefore will continue to benefit from skilled PT intervention to enhance overall performance with activity tolerance, balance, postural control and functional use of  right lower extremity and left lower extremity.  Patient not progressing toward long term goals.  See goal revision..  Plan of care revisions: Goals downgraded to min-mod A overall due to slow progress.  PT Short Term Goals Week 1:  PT Short Term Goal 1 (Week 1): Pt will be able to tolerate OOB in recliner x 4 hours a day to promote upright tolerance PT Short Term Goal 1 - Progress (Week 1): Progressing toward goal PT Short Term Goal 2 (Week 1): Pt will be able to perform sitting balance activities x 10 min with mod A PT Short Term Goal 2 - Progress (Week 1): Progressing toward goal PT Short Term Goal 3 (Week 1): Pt will be able to initiate weightbearing through BLE with lift equipment PT Short Term Goal 3 - Progress (Week 1): Met Week 2:  PT Short Term Goal 1 (Week 2): Pt will initiate and assist with rolling in bed with mod A of one person PT Short Term Goal 2 (Week 2): Pt will perform supine > sit with mod A of one person PT  Short Term Goal 3 (Week 2): Pt will perform transfers bed <> w/c with slideboard and max A of one person PT Short Term Goal 4 (Week 2): Pt will perform w/c mobility in manual w/c x 50' with mod A  PT Short Term Goal 5 (Week 2): Pt will tolerate standing at 80-90 deg with use of tilt table x 10 minutes while performing LE exercises and WB  Therapy Documentation Precautions:  Precautions Precautions: Fall Precaution Comments: abdominal wound, colostomy Required Braces or Orthoses: Other Brace/Splint Other Brace/Splint: abdominal binder when OOB Restrictions Weight Bearing Restrictions: No Vital Signs: Therapy Vitals Temp: 98 F (36.7 C) Temp Source: Oral Pulse Rate: 90 Resp: 17 BP: (!) 112/58 mmHg Patient Position (if appropriate): Lying Oxygen Therapy SpO2: 99 % O2 Device: Not Delivered Pain: Pain Assessment Pain Score: 7  Pain Type: Surgical pain Pain Location: Abdomen Pain Descriptors / Indicators: Aching Pain Intervention(s): Medication (See eMAR)   See Function Navigator for Current Functional Status.   Raylene Everts [place] 07/04/2015, 8:16 AM

## 2015-07-04 NOTE — Progress Notes (Signed)
Patient is refusing CPAP tonight. States she is fine wearing oxygen. RT made patient aware of importance and that if she changes her mind to call and we would set up a CPAP for her.

## 2015-07-04 NOTE — Progress Notes (Signed)
Physical Therapy Session Note  Patient Details  Name: Dominique [name] MRN: [id number] Date of Birth: September 17, 1981  Today's Date: 07/04/2015 PT Individual Time: 1100-1200 PT Individual Time Calculation (min): 60 min   Skilled Therapeutic Interventions/Progress Updates:   Session focused on increasing upright tolerance, facilitating weightbearing through BLE, and activity tolerance. Patient received sitting in wheelchair with sling in place, handoff from OT for co-treatment with recreational therapist. In day room, patient transferred from sitting in wheelchair <> supine on tilt table with maxi move and +2 A for safety and management of catheter and wound vac. Patient set up on tilt table with straps at upper chest, across hips and at knees. Patient slowly inclined to 50 deg with bilateral lower extremities on foot board to provide increased WB in upright position. Patient able to tolerate incline at 50 deg x 3.5 minutes before requesting to return to supine due to back pain. Transferred back to wheelchair without cushion via maxi move with +3 A to tip wheelchair to get hips all the way back to promote improved upright posture in wheelchair. Returned to room with recreational therapist and patient requested to return to bed, RN notified of frequent spasms throughout session and patient desire to return to bed.   Therapy Documentation Precautions:  Precautions Precautions: Fall Precaution Comments: abdominal wound vac, colostomy Required Braces or Orthoses: Other Brace/Splint Other Brace/Splint: abdominal binder when OOB Restrictions Weight Bearing Restrictions: No Pain: Intermittent spasms, instructed in relaxation/deep breathing techniques throughout session and RN notified  See Function Navigator for Current Functional Status.   Therapy/Group: Individual Therapy  Carney Living A 07/04/2015, 1:20 PM

## 2015-07-04 NOTE — Consult Note (Addendum)
WOC follow-up: Ostomy pouching: Ostomy pouch changed yesterday related to leakage behind barrier.  CCS team at bedside this AM to assess abd wound and stoma. Applied 2 piece pouch with barrier ring. Stoma is red and moist with decreasing amt slough, slightly above skin level, 1 3/4 inches, mucutaneous separation occurring to 50% of peristomal edges, from 6:00 o'clock to 12:00 o'clock with mod amt thick tan pus and some odor.Red granulating tissue underneath when pus is absorbed.  Mod amt formed brown stool in pouch. Education provided: Demonstrated pouch change using 2 piece pouch and a barrier ring to attempt to maintain seal. Applied strip of Aquacel packing into separation edges of stoma. Enrolled patient in [facility] program: No Ostomy supplies and instructions at the bedside for nurse use. Pt watched procedure and asked appropriate questions. She will need total assistance with pouching upon discharge until this complex problem is resolved.  Wound type: Full thickness midline abd wound, CCS following for assessment and plan of care.Requested to apply Vac dressing. Yellow liquid in wound base, no further pus at this time, no odor. Full thickness post-op wound 90% red, 10% yellow slough in patchy areas.  [id number] with undermining to 4 cm at 12:00 o'clock to 3:00 o'clock when swab inserted, does not communicate underneath with peristomal wound.  Applied Mepitel contact layer, then one piece of white foam to lower inner wound, then one piece of black foam to 179m cont suction.  Pt tolerated with mod amt discomfort.  Plan to change dressing on FRI at 0800.   DJulien GirtMSN, RN, CWOCN, CWCN-AP, CNS

## 2015-07-04 NOTE — Progress Notes (Signed)
Occupational Therapy Session Note  Patient Details  Name: Shawntina Diffee MRN: [id number] Date of Birth: 1981-08-12  Today's Date: 07/04/2015 OT Individual Time:  -       Short Term Goals: Week 2:  OT Short Term Goal 1 (Week 2): Pt will complete upper body dressing sitting unsupported at EOB with Min assist. OT Short Term Goal 2 (Week 2): Pt will complete grooming sititng at EOB unsupported with supervision and setup OT Short Term Goal 3 (Week 2): Pt will complete slide board transfer, w/c <> raised mat, with +2 assist only (no Maxi-Sky sling used)  Skilled Therapeutic Interventions/Progress Updates:    1:1 Pt in bed asleep when arrived. Pt agreeable to OOB activity. Focused on bed mobility including rolling side to side using bed sheets tied to bed rail for UB support to roll for lift pad placement.  With "sheet rope" pt able to roll with one person with extra time.  Used maxi move to transfer to w/c with +2 for safety with lift.  Pt pushed down the gym and transferred again with maxi move to EOM. Focus on maintain static sitting balance un support with feet firmly placed on the ground. Pt able to maintain balance EOM for 15-20 min with supervision. Education provided on slide board transfers w/c <>mat. Practiced lateral leans to elbow with mod A to return to sitting position from sidelying for placement of maxi sky pad and slide board (total A for position of LE in placement of board).  Performed slide board transfer with the support of the  maxi sky around pt but in a position that allowed pt to feel safe but slack to allow lateral movement on slide board.  Pt's feet on 2 inch step on the floor to achieve a more forward weight shift and encourage heavy weight bearing through LEs.  Focused on head/ hip relationship with therapist in front of patient for visual and tactile cues. This clinician behind pt assisting lateral movement along the board with 3rd person securing position of w/c and slide board  for safety.  Pt able to perform effect weight shift and lateral movement with max  A +3. Transferred back to bed via maxi move at end of session. Pt's ostomy leaking when abdominal binder removed- RN called in to address.   Therapy Documentation Precautions:  Precautions Precautions: Fall Precaution Comments: abdominal wound vac, colostomy Required Braces or Orthoses: Other Brace/Splint Other Brace/Splint: abdominal binder when OOB Restrictions Weight Bearing Restrictions: No Pain: Complained of stomach spasms throughout session- RN aware and premedicated See Function Navigator for Current Functional Status.   Therapy/Group: Individual Therapy  Willeen Cass [facility] 07/04/2015, 10:03 PM

## 2015-07-04 NOTE — Progress Notes (Signed)
Patient ID: Dominique [name], female   DOB: 1981-02-19, 34 y.o.   MRN: [id number]      [facility]      [address] [place], [place] [address]    Phone: [phone] FAX: [phone]     Subjective: No issues.   Objective:  Vital signs:  Filed Vitals:   07/03/15 0514 07/03/15 1508 07/03/15 2022 07/04/15 0451  BP: 104/56 104/58 111/46 112/58  Pulse: 91 93 89 90  Temp: 98 F (36.7 C) 97.9 F (36.6 C)  98 F (36.7 C)  TempSrc: Oral Oral  Oral  Resp: 18 18  17   SpO2: 98% 98%  99%    Last BM Date: 07/03/15  Intake/Output   Yesterday:  10/10 0701 - 10/11 0700 In: 840 [P.O.:840] Out: 1900 [Urine:1900] This shift:          Problem List:   Principal Problem:   Debilitated Active Problems:   Morbid obesity- BMI 67.5   OSA (obstructive sleep apnea)   Crohn's disease (HCC)   Chronic systolic CHF (congestive heart failure) (HCC)   Adjustment disorder with mixed anxiety and depressed mood    Results:   Labs: Results for orders placed or performed during the hospital encounter of 06/26/15 (from the past 48 hour(s))  Glucose, capillary     Status: Abnormal   Collection Time: 07/02/15 11:34 AM  Result Value Ref Range   Glucose-Capillary 110 (H) 65 - 99 mg/dL   Comment 1 Notify RN   Glucose, capillary     Status: Abnormal   Collection Time: 07/02/15  4:30 PM  Result Value Ref Range   Glucose-Capillary 114 (H) 65 - 99 mg/dL   Comment 1 Notify RN   Glucose, capillary     Status: Abnormal   Collection Time: 07/02/15  9:12 PM  Result Value Ref Range   Glucose-Capillary 102 (H) 65 - 99 mg/dL  Glucose, capillary     Status: None   Collection Time: 07/03/15  6:27 AM  Result Value Ref Range   Glucose-Capillary 95 65 - 99 mg/dL  CBC     Status: Abnormal   Collection Time: 07/03/15  9:20 AM  Result Value Ref Range   WBC 11.1 (H) 4.0 - 10.5 K/uL   RBC 3.25 (L) 3.87 - 5.11 MIL/uL   Hemoglobin 8.0 (L) 12.0 - 15.0 g/dL     HCT 25.8 (L) 36.0 - 46.0 %   MCV 79.4 78.0 - 100.0 fL   MCH 24.6 (L) 26.0 - 34.0 pg   MCHC 31.0 30.0 - 36.0 g/dL   RDW 26.7 (H) 11.5 - 15.5 %   Platelets 306 150 - 400 K/uL  Glucose, capillary     Status: None   Collection Time: 07/03/15 12:12 PM  Result Value Ref Range   Glucose-Capillary 99 65 - 99 mg/dL   Comment 1 Notify RN   Glucose, capillary     Status: None   Collection Time: 07/03/15  4:37 PM  Result Value Ref Range   Glucose-Capillary 94 65 - 99 mg/dL   Comment 1 Notify RN   Glucose, capillary     Status: Abnormal   Collection Time: 07/03/15  8:40 PM  Result Value Ref Range   Glucose-Capillary 119 (H) 65 - 99 mg/dL  Glucose, capillary     Status: None   Collection Time: 07/04/15  6:18 AM  Result Value Ref Range   Glucose-Capillary 93 65 - 99 mg/dL    Imaging /  Studies: No results found.  Medications / Allergies:  Scheduled Meds: . ALPRAZolam  0.25 mg Oral BID WC  . feeding supplement (ENSURE ENLIVE)  237 mL Oral TID BM  . furosemide  40 mg Oral BID  . heparin  5,000 Units Subcutaneous 3 times per day  . insulin aspart  0-15 Units Subcutaneous TID WC  . metoCLOPramide  5 mg Oral TID AC  . metoprolol tartrate  50 mg Oral BID  . nitrofurantoin (macrocrystal-monohydrate)  100 mg Oral Q12H  . pantoprazole  40 mg Oral Daily  . phenazopyridine  200 mg Oral TID WC  . polyethylene glycol  17 g Oral Daily  . potassium chloride  20 mEq Oral Daily  . protein supplement  1 scoop Oral TID WC  . saccharomyces boulardii  250 mg Oral BID   Continuous Infusions:  PRN Meds:.guaiFENesin, HYDROcodone-acetaminophen, methocarbamol, ondansetron **OR** ondansetron (ZOFRAN) IV, simethicone, sodium chloride, sorbitol  Antibiotics: Anti-infectives    Start     Dose/Rate Route Frequency Ordered Stop   06/30/15 2000  ciprofloxacin (CIPRO) tablet 250 mg  Status:  Discontinued     250 mg Oral 2 times daily 06/30/15 1716 07/02/15 1418        Assessment/Plan Perforated  descending colon, colitis POD 21,23 s/p ex lap with DRAINAGE OF INTRA-ABDOMINAL ABSCESS, PARTIAL COLON RESECTION, re-exploration and creation of colostomy and abdominal closure---Dr. Lanier Clam - wound has cleaned up nicely. Will place a wound VAC(M-W-F) -stoma--mucocutaneous separation, pink and viable, continue purulent drainage 3-12 oclock -miralax -up with therapies -abd binder -will follow up on friday   Erby Pian, [facility] Pager [phone](7A-4:30P) For consults and floor pages call [phone](7A-4:30P)  07/04/2015 9:12 AM

## 2015-07-04 NOTE — Progress Notes (Signed)
Subjective/Complaints: Abd pain but no other pain c/os Slept ok Voiding ok Appreciate Gen Surg note ROS:  No CP or SOB, no increased limb swelling Objective: Vital Signs: Blood pressure 112/58, pulse 90, temperature 98 F (36.7 C), temperature source Oral, resp. rate 17, SpO2 99 %. No results found. Results for orders placed or performed during the hospital encounter of 06/26/15 (from the past 72 hour(s))  Glucose, capillary     Status: None   Collection Time: 07/01/15 11:41 AM  Result Value Ref Range   Glucose-Capillary 91 65 - 99 mg/dL   Comment 1 Notify RN   Glucose, capillary     Status: None   Collection Time: 07/01/15  4:26 PM  Result Value Ref Range   Glucose-Capillary 93 65 - 99 mg/dL   Comment 1 Notify RN   Glucose, capillary     Status: Abnormal   Collection Time: 07/01/15  9:21 PM  Result Value Ref Range   Glucose-Capillary 122 (H) 65 - 99 mg/dL  Glucose, capillary     Status: None   Collection Time: 07/02/15  6:43 AM  Result Value Ref Range   Glucose-Capillary 96 65 - 99 mg/dL  Glucose, capillary     Status: Abnormal   Collection Time: 07/02/15 11:34 AM  Result Value Ref Range   Glucose-Capillary 110 (H) 65 - 99 mg/dL   Comment 1 Notify RN   Glucose, capillary     Status: Abnormal   Collection Time: 07/02/15  4:30 PM  Result Value Ref Range   Glucose-Capillary 114 (H) 65 - 99 mg/dL   Comment 1 Notify RN   Glucose, capillary     Status: Abnormal   Collection Time: 07/02/15  9:12 PM  Result Value Ref Range   Glucose-Capillary 102 (H) 65 - 99 mg/dL  Glucose, capillary     Status: None   Collection Time: 07/03/15  6:27 AM  Result Value Ref Range   Glucose-Capillary 95 65 - 99 mg/dL  CBC     Status: Abnormal   Collection Time: 07/03/15  9:20 AM  Result Value Ref Range   WBC 11.1 (H) 4.0 - 10.5 K/uL   RBC 3.25 (L) 3.87 - 5.11 MIL/uL   Hemoglobin 8.0 (L) 12.0 - 15.0 g/dL   HCT 25.8 (L) 36.0 - 46.0 %   MCV 79.4 78.0 - 100.0 fL   MCH 24.6 (L) 26.0 - 34.0 pg    MCHC 31.0 30.0 - 36.0 g/dL   RDW 26.7 (H) 11.5 - 15.5 %   Platelets 306 150 - 400 K/uL  Glucose, capillary     Status: None   Collection Time: 07/03/15 12:12 PM  Result Value Ref Range   Glucose-Capillary 99 65 - 99 mg/dL   Comment 1 Notify RN   Glucose, capillary     Status: None   Collection Time: 07/03/15  4:37 PM  Result Value Ref Range   Glucose-Capillary 94 65 - 99 mg/dL   Comment 1 Notify RN   Glucose, capillary     Status: Abnormal   Collection Time: 07/03/15  8:40 PM  Result Value Ref Range   Glucose-Capillary 119 (H) 65 - 99 mg/dL  Glucose, capillary     Status: None   Collection Time: 07/04/15  6:18 AM  Result Value Ref Range   Glucose-Capillary 93 65 - 99 mg/dL     HEENT: light sensitive Right eye Cardio: RRR and no murmur Resp: CTA B/L and unlabored GI: BS positive and LLQ colostomy, midline open abd  wound with Wet to Dry ,  Extremity:  No Edema Skin:   Wound see above, no heel ulceration Neuro: Alert/Oriented and Abnormal Motor 5/5 in BUE, 3- bilateral HF,able to lift heel off bed ~3 inches when supine 4- Knee ext and ankle DF/PF Musc/Skel:  Other no pain with UE and LE ROM Gen NAD   Assessment/Plan: 1. Functional deficits secondary to debilitation/Crohn's disease/perforated left colon with peritonitis/exploratory laparotomy with drainage of intra-abdominal abscess partial colon resection with colostomy which require 3+ hours per day of interdisciplinary therapy in a comprehensive inpatient rehab setting. Physiatrist is providing close team supervision and 24 hour management of active medical problems listed below. Physiatrist and rehab team continue to assess barriers to discharge/monitor patient progress toward functional and medical goals.   FIM: Function - Bathing Position: Wheelchair/chair at sink (w/c for upper body; bed level for lower) Body parts bathed by patient: Right arm, Left arm, Chest Body parts bathed by helper: Abdomen, Front perineal  area, Buttocks, Right upper leg, Left upper leg, Right lower leg, Left lower leg, Back Bathing not applicable: Abdomen, Buttocks, Front perineal area (peri care done prior to OT session) Assist Level: Touching or steadying assistance(Pt > 75%)  Function- Upper Body Dressing/Undressing What is the patient wearing?: Hospital gown Assist Level: Set up Set up : To obtain clothing/put away Function - Lower Body Dressing/Undressing What is the patient wearing?: Hospital Gown, Non-skid slipper socks Assist for lower body dressing: Touching or steadying assistance (Pt > 75%)  Function - Toileting Toileting activity did not occur: N/A Toileting steps completed by patient: Adjust clothing prior to toileting, Performs perineal hygiene, Adjust clothing after toileting Toileting steps completed by helper: Adjust clothing prior to toileting, Performs perineal hygiene, Adjust clothing after toileting Assist level: Touching or steadying assistance (Pt.75%)  Function - Toilet Transfers Toilet transfer activity did not occur: N/A Toilet transfer assistive device: Mechanical lift Mechanical lift: [place] Assist level to toilet: 2 helpers Assist level from toilet: 2 helpers  Function - Chair/bed transfer Chair/bed transfer activity did not occur: Safety/medical concerns Chair/bed transfer method: Other Chair/bed transfer assist level: dependent (Pt equals 0%) Chair/bed transfer assistive device: Mechanical lift Mechanical lift: Maximove Chair/bed transfer details: Verbal cues for safe use of DME/AE, Manual facilitation for placement  Function - Locomotion: Wheelchair Will patient use wheelchair at discharge?: Yes Type: Manual Wheelchair activity did not occur: Safety/medical concerns (debility and awaiting appropriate equipment) Max wheelchair distance: 60' Assist Level: Dependent (Pt equals 0%) Assist Level: Dependent (Pt equals 0%) Assist Level: Dependent (Pt equals 0%) Turns around,maneuvers  to table,bed, and toilet,negotiates 3% grade,maneuvers on rugs and over doorsills: No Function - Locomotion: Ambulation Ambulation activity did not occur: Safety/medical concerns (significant debility)  Function - Comprehension Comprehension: Auditory Comprehension assist level: Follows complex conversation/direction with extra time/assistive device  Function - Expression Expression: Verbal Expression assist level: Expresses complex 90% of the time/cues < 10% of the time  Function - Social Interaction Social Interaction assist level: Interacts appropriately with others with medication or extra time (anti-anxiety, antidepressant).  Function - Problem Solving Problem solving assist level: Solves complex problems: With extra time  Function - Memory Memory assist level: Complete Independence: No helper Patient normally able to recall (first 3 days only): Current season, Location of own room, Staff names and faces, That he or she is in a hospital   Medical Problem List and Plan: 1. Functional deficits secondary to debilitation/Crohn's disease/perforated left colon with peritonitis/exploratory laparotomy with drainage of intra-abdominal abscess partial colon resection  with colostomy. Continue Pentasa 500 mg 4 times a day 2.  DVT Prophylaxis/Anticoagulation: Subcutaneous heparin. Monitor platelet counts, currently noi signs of bleeding 3. Pain Management: Hydrocodone as needed 4. Acute blood loss anemia. Monitor while on heparin, hgb 8.0 on 10/10 5. Neuropsych: This patient is capable of making decisions on her own behalf. 6. Skin/Wound Care: dehiscence noted at muco cutaneous jct of stoma- CCS seeing pt on M-W-F and WOC RN following, placement of wound vac planned for today 7. Fluids/Electrolytes/Nutrition: Strict I&O with follow-up chemistries 8. Acute renal insufficiency 2.64-2.84. Creatinine latest result 1.09. Follow-up chemistries stable 9. Acute respiratory failure/VDRF. Extubated  06/14/2015.propable obesity hypoventilation syndrome, cont PRN O2 10. SVT. Monitor heart rate. Lopressor 50 mg twice a day, pt also severely deconditioned with poor exercise tolerance, am HR 90 11. Chronic systolic congestive heart failure. Monitor for any signs of fluid overload. Lasix 40 mg twice a day, monitor intake and creat, no SOB, sats ok 12.  UTI ecoli- S to nitro, cont abx x 10d 13. Morbid obesity. Body mass index 67.33 14.  Mild hypokalemia - will supplement  15.  Severe hypoalbuminemia- supplement , this is contributing to delayed wound healing , doesn't tolerate benprotein but drinks ensure  LOS (Days) 8 A FACE TO FACE EVALUATION WAS PERFORMED  KIRSTEINS,ANDREW E 07/04/2015, 6:51 AM

## 2015-07-04 NOTE — Progress Notes (Signed)
Occupational Therapy Weekly Progress Note  Patient Details  Name: Dominique [name] MRN: [id number] Date of Birth: 11/13/1980  Beginning of progress report period: June 27, 2015 End of progress report period: July 04, 2015  Today's Date: 07/04/2015 OT Individual Time: 1000-1100 OT Individual Time Calculation (min): 60 min   Patient has met 2 of 5 short term goals.   Change in status this week with use of Foley cath and pt now has 1 clothing set to practice dressing skills.   Patient continues to demonstrate the following deficits: Impaired activity tolerance, anxiety, impaired dynamic sitting balance, impaired transfers, impaired mobility, general weakness and therefore will continue to benefit from skilled OT intervention to enhance overall performance with BADL.  Patient progressing toward long term goals..  Continue plan of care.  OT Short Term Goals Week 1:  OT Short Term Goal 1 (Week 1): Pt will complete upper body bathing seated with Mod Assist OT Short Term Goal 1 - Progress (Week 1): Met OT Short Term Goal 2 (Week 1): Pt will complete transfer from EOB to w/c with Max Assist OT Short Term Goal 2 - Progress (Week 1): Progressing toward goal OT Short Term Goal 3 (Week 1): Pt will complete lower body bathing supine with Mod Assist OT Short Term Goal 3 - Progress (Week 1): Progressing toward goal OT Short Term Goal 4 (Week 1): Pt will complete toileting with Max Assist  OT Short Term Goal 4 - Progress (Week 1): Discontinued (comment) (d/t foley cath and ostomy) OT Short Term Goal 5 (Week 1): Pt will demo ability to complete HEP for BUE with supervision OT Short Term Goal 5 - Progress (Week 1): Met Week 2:  OT Short Term Goal 1 (Week 2): Pt will complete upper body dressing sitting unsupported at EOB with Min assist. OT Short Term Goal 2 (Week 2): Pt will complete grooming sititng at EOB unsupported with supervision and setup OT Short Term Goal 3 (Week 2): Pt will complete slide  board transfer, w/c <> raised mat, with +2 assist only (no Maxi-Sky sling used)  Skilled Therapeutic Interventions/Progress Updates: ADL-retraining with focus on improved bed mobility (no +2 helper), dressing skills, and improved supported sitting balance.    Pt received supine in bed, asleep, but aroused with min environmental adjustments used: lights, noise, tactile stim.   Pt was then educated on progression of treatment and plan to improve bed mobility using DME with mods to improve reach toward rails.   Pt performed lower body bathing using LH sponge to with her reach (thighs) but requires max assist to wash lower legs, peri-area, buttocks and feet.   Pt able to roll to left and right this session with Mod-Max Assist to lift 1 leg and instruction cues to pull over using bed sheets laced on bed rails as therapist manually advances her breast first.   Pt completes 5 rolls during assist lower body washing and assists with dressing by reaching her skirt to partially hike it over her left hip while supine this date.   Pt requires Maxi-Move assist to w/c and bathes upper body with setup while seated.  Pt dons bra and shirt with assist.   Pt left in w/c at end of session as physical therapist arrives for next treatment.     Therapy Documentation Precautions:  Precautions Precautions: Fall Precaution Comments: abdominal wound vac, colostomy Required Braces or Orthoses: Other Brace/Splint Other Brace/Splint: abdominal binder when OOB Restrictions Weight Bearing Restrictions: No  Pain: Pain Assessment Pain   Assessment: 0-10 Pain Score: 6  Pain Type: Acute pain Pain Location: Abdomen Pain Descriptors / Indicators: Aching Pain Frequency: Constant Pain Onset: On-going Patients Stated Pain Goal: 3 Pain Intervention(s): Medication (See eMAR)  ADL: ADL ADL Comments: see Functional Assessment   See Function Navigator for Current Functional Status.   Therapy/Group: Individual  Therapy  [place] 07/04/2015, 3:57 PM

## 2015-07-04 NOTE — Plan of Care (Signed)
Problem: RH Balance Goal: LTG Patient will maintain dynamic sitting balance (PT) LTG: Patient will maintain dynamic sitting balance with assistance during mobility activities (PT)  07/04/15 downgraded due to slow progress Goal: LTG Patient will maintain dynamic standing balance (PT) LTG: Patient will maintain dynamic standing balance with assistance during mobility activities (PT)  07/04/15 downgraded due to slow progress  Problem: RH Bed to Chair Transfers Goal: LTG Patient will perform bed/chair transfers w/assist (PT) LTG: Patient will perform bed/chair transfers with assistance, with/without cues (PT).  07/04/15 Downgraded due to slow progress  Problem: RH Ambulation Goal: LTG Patient will ambulate in controlled environment (PT) LTG: Patient will ambulate in a controlled environment, # of feet with assistance (PT).  07/04/15 Downgraded due to slow progress Goal: LTG Patient will ambulate in home environment (PT) LTG: Patient will ambulate in home environment, # of feet with assistance (PT).  07/04/15 downgraded due to slow progress  Problem: RH Wheelchair Mobility Goal: LTG Patient will propel w/c in controlled environment (PT) LTG: Patient will propel wheelchair in controlled environment, # of feet with assist (PT)  07/04/15 downgraded due to slow progress  Problem: RH Stairs Goal: LTG Patient will ambulate up and down stairs w/assist (PT) LTG: Patient will ambulate up and down # of stairs with assistance (PT)  07/04/15 Downgraded due to slow progress

## 2015-07-04 NOTE — Progress Notes (Signed)
Recreational Therapy Assessment and Plan  Patient Details  Name: Dominique [name] MRN: [id number] Date of Birth: 1981-09-14 Today's Date: 07/04/2015  Rehab Potential: Good ELOS: 3 weeks   Assessment Clinical Impression:  Problem List:  Patient Active Problem List   Diagnosis Date Noted  . Debilitated 06/26/2015  . Acute renal failure syndrome ([place])   . Cardiomyopathy- etiology not yet determined 06/20/2015  . Chronic systolic heart failure ([place])   . Pulmonary hypertension ([place])   . Chronic systolic CHF (congestive heart failure) ([place])   . Acute respiratory failure with hypoxia ([place])   . Crohn's disease ([place])   . Blood poisoning ([place])   . Perforated sigmoid colon ([place])   . Peritonitis ([place])   . Peritoneal abscess ([place])   . OSA on CPAP   . Obesity hypoventilation syndrome ([place])   . Acute renal insufficiency   . Acute encephalopathy   . Pressure ulcer 06/16/2015  . Protein-calorie malnutrition, severe ([place]) 06/10/2015  . Colitis 06/04/2015  . Generalized abdominal pain   . Gastritis 04/07/2015  . Iron deficiency anemia 04/07/2015  . Sinus tachycardia ([place]) 12/29/2014  . Lumbar back pain 12/06/2014  . Fluid retention 12/01/2014  . Abdominal pain   . Fall 11/20/2014  . Rectal bleeding 11/19/2014  . Diverticulitis of colon 11/17/2014  . Hepatic steatosis 11/17/2014  . Elevated LFTs 11/17/2014  . Seasonal allergies 06/13/2014  . OSA (obstructive sleep apnea) 05/02/2014  . Thyromegaly 05/02/2014  . AMENORRHEA 02/07/2010  . Morbid obesity- BMI 67.5 03/22/2009    Past Medical History:  Past Medical History  Diagnosis Date  . Asthma   . Sleep apnea   . Anemia   . Morbid obesity 03/22/2009    Qualifier: Diagnosis of By: Ronnald Ramp CMA, [place]   . OSA (obstructive sleep apnea) 05/02/2014  . Thyromegaly 05/02/2014  . Seasonal allergies  06/13/2014  . Acute acalculous cholecystitis 11/16/2014  . Diverticulitis of colon 11/17/2014  . Hepatic steatosis 11/17/2014   Past Surgical History:  Past Surgical History  Procedure Laterality Date  . C section 2011  2011  . Flexible sigmoidoscopy N/A 06/05/2015    Procedure: FLEXIBLE SIGMOIDOSCOPY; Surgeon: Ronald Lobo, MD; Location: [place]; Service: Endoscopy; Laterality: N/A;  . Laparotomy N/A 06/09/2015    Procedure: EXPLORATORY LAPAROTOMY, DRAINAGE OF INTRAABDOMINAL ABSCESS, PARTIAL COLON RESECTION, APPLICATION OF WOUND VAC; Surgeon: Georganna Skeans, MD; Location: [place]; Service: General; Laterality: N/A;  . Laparotomy N/A 06/11/2015    Procedure: RE-EXPLORATION OF ABDOMEN; Surgeon: Georganna Skeans, MD; Location: [facility]; Service: General; Laterality: N/A;  . Colostomy N/A 06/11/2015    Procedure: CREATION OF COLOSTOMY; Surgeon: Georganna Skeans, MD; Location: [place] [facility]; Service: General; Laterality: N/A;    Assessment & Plan Clinical Impression: Patient is a 34 y.o. year old right handed morbidly obese female admitted 06/04/2015 with noted history of asthma, chronic systolic congestive heart failure and recent recurrent colitis since January 2016. Patient lives with family independent prior to admission. Presented with severe abdominal pain with occasional vomiting and nausea. Placed on intravenous Cipro and Flagyl. Underwent sigmoidoscopy 06/05/2015 showing severe chronic inflammatory changes in the proximal sigmoid and descending colon. Biopsies diagnosed with Crohn disease maintained on Solu-Medrol as well as mesalamine. CT angiogram and imaging revealed free intraperitoneal air/perforated viscus likely perforated left colon from Crohn's colitis. Underwent exploratory laparotomy drainage of intra-abdominal abscess partial colon resection application of wound VAC 06/09/2015 per Dr. Grandville Silos followed by reexploration of abdomen  with closure creation of colostomy 06/11/2015.. Follow-up wound care nurse for ostomy care.  Dressing changes wet-to-dry every shift. Hospital course respiratory failure required intubation followed by critical care medicine. She was extubated 06/14/2015. Hospital course acute blood loss anemia 7.0 transfused with latest hemoglobin 7.8 and monitored. Acute renal insufficiency creatinine 2.64-2.84. Renal ultrasound showing no hydronephrosis with latest creatinine 1. I will 9. Bouts of SVT responding to intravenous Cardizem transitioned to by mouth Lopressor and followed by cardiology services. Echocardiogram 06/18/2015 with ejection fraction of 44% normal systolic function. Subcutaneous heparin for DVT prophylaxis. Diet slowly advanced from clear liquids to mechanical soft. Physical therapy evaluation completed 06/15/2015 with recommendations of physical medicine rehabilitation consult. Patient transferred to CIR on 06/26/2015 .      Pt presents with decreased activity tolerance, decreased functional mobility, decreased balance, & anxiety Limiting pt's independence with leisure/community pursuits.   Leisure History/Participation Premorbid leisure interest/current participation:  (bowling) Expression Interests: Music (Comment) Other Leisure Interests: Television;Movies;Cooking/Baking Leisure Participation Style: With Family/Friends Awareness of Community Resources: Good-identify 3 post discharge leisure resources Psychosocial / Spiritual Patient agreeable to Pet Therapy: Yes Does patient have pets?: No Social interaction - Mood/Behavior: Cooperative Engineer, drilling for Education?: Yes Recreational Therapy Orientation Orientation -Reviewed with patient: Available activity resources Strengths/Weaknesses Patient Strengths/Abilities: Willingness to participate Patient weaknesses: Physical limitations TR Patient demonstrates impairments in the following area(s):  Endurance;Motor;Pain;Safety;Skin Integrity  Plan Rec Therapy Plan Is patient appropriate for Therapeutic Recreation?: Yes Rehab Potential: Good Treatment times per week: Min 1 time per week >20 minutes Estimated Length of Stay: 3 weeks TR Treatment/Interventions: Adaptive equipment instruction;1:1 session;Balance/vestibular training;Functional mobility training;Community reintegration;Patient/family education;Therapeutic activities;Leisure education;Recreation/leisure participation;Therapeutic exercise;UE/LE Coordination activities;Wheelchair propulsion/positioning  Recommendations for other services: None  Discharge Criteria: Patient will be discharged from TR if patient refuses treatment 3 consecutive times without medical reason.  If treatment goals not met, if there is a change in medical status, if patient makes no progress towards goals or if patient is discharged from hospital.  The above assessment, treatment plan, treatment alternatives and goals were discussed and mutually agreed upon: by patient  [place] 07/04/2015, 2:14 PM

## 2015-07-05 ENCOUNTER — Ambulatory Visit (HOSPITAL_COMMUNITY): Payer: Medicaid Other | Admitting: Physical Therapy

## 2015-07-05 ENCOUNTER — Inpatient Hospital Stay (HOSPITAL_COMMUNITY): Payer: Medicaid Other | Admitting: Physical Therapy

## 2015-07-05 ENCOUNTER — Inpatient Hospital Stay (HOSPITAL_COMMUNITY): Payer: Medicaid Other

## 2015-07-05 LAB — GLUCOSE, CAPILLARY
GLUCOSE-CAPILLARY: 97 mg/dL (ref 65–99)
GLUCOSE-CAPILLARY: 85 mg/dL (ref 65–99)
Glucose-Capillary: 114 mg/dL — ABNORMAL HIGH (ref 65–99)
Glucose-Capillary: 128 mg/dL — ABNORMAL HIGH (ref 65–99)

## 2015-07-05 NOTE — Progress Notes (Signed)
Occupational Therapy Note  Patient Details  Name: Dominique [name] MRN: [id number] Date of Birth: 07/11/1981  Today's Date: 07/05/2015 OT Individual Time: 1300-1400 OT Individual Time Calculation (min): 60 min   Pt c/o sporadic abdominal muscle spasms throughout session; medications admin prior to therapy session Individual Therapy  Pt resting in bed upon arrival.  Pt stated she took her pain meds too late and she was drowsy.  Pt also c/o increased abdominal muscle spasms.  Pt agreeable to bed mobility activities and BUE therex with 4# and 2# weight bars.  Pt initiates rolling to side to facilitate repositioning of abdominal binder but requires tot A + 2 to complete task. Pt transitioned to BUE therex with weight bars.  Pt required multiple rest breaks during session.  Focus on increased activity tolerance, bed mobility, and BUE therex to increased independence with BADLs.   Leotis Shames [facility] 07/05/2015, 2:39 PM

## 2015-07-05 NOTE — Progress Notes (Signed)
Physical Therapy Session Note  Patient Details  Name: Dominique [name] MRN: [id number] Date of Birth: [birth year]  Today's Date: 07/05/2015 PT Individual Time: 1030-1200 PT Individual Time Calculation (min): 90 min   Short Term Goals: Week 2:  PT Short Term Goal 1 (Week 2): Pt will initiate and assist with rolling in bed with mod A of one person PT Short Term Goal 2 (Week 2): Pt will perform supine > sit with mod A of one person PT Short Term Goal 3 (Week 2): Pt will perform transfers bed <> w/c with slideboard and max A of one person PT Short Term Goal 4 (Week 2): Pt will perform w/c mobility in manual w/c x 50' with mod A  PT Short Term Goal 5 (Week 2): Pt will tolerate standing at 80-90 deg with use of tilt table x 10 minutes while performing LE exercises and WB  Skilled Therapeutic Interventions/Progress Updates:    Pt received resting in bed, agreeable to PT - wound vac for abdomen in place, ab binder on over ostomy bag, foley in place. Therapeutic Exercise - bed in reverse [place] maximally (20 degrees): mini squats, heel raises, and marching 2 x 30 reps with feet planted against footboard for partial weightbearing. Bed flattened and PT & Tech stabilize feet on mattress while pt completes bridges 2 x 30 reps, then supine hip abduction/adduction 2 x 30 reps each AAROM for full range gravity minimized supine hip abduction. Therapeutic Activity - see function tab for details -PT instructs pt in use of sheet rope to assist in getting far arm over to bedrail and pt is supposed to pull on sheet while walking hands closer to bedrail - req one person assist at trunk/arms and with leg pre-positioning to assist in rolling (R and L for maximove sling placement). 2 person maximove transfer bed to/from manual w/c. W/C Management - See function tab for details - Pt has significant difficulty turning manual w/c and due to positioning in w/c with R hip forward, L UE makes poor contact with wheel,  necessitating mod A from PT for propulsion. Pt ended in bed with all needs in reach. Pt is making progress slowly. Continue per PT POC.   Therapy Documentation Precautions:  Precautions Precautions: Fall Precaution Comments: abdominal wound vac, colostomy Required Braces or Orthoses: Other Brace/Splint Other Brace/Splint: abdominal binder when OOB Restrictions Weight Bearing Restrictions: No Pain: Pain Assessment Pain Assessment: No/denies pain Pain Score: Asleep   See Function Navigator for Current Functional Status.   Therapy/Group: Individual Therapy  Jaziyah Gradel M 07/05/2015, 10:47 AM

## 2015-07-05 NOTE — Progress Notes (Signed)
Patient refused CPAP for tonight. RT informed patient to have nurse call RT if she changes her mind.

## 2015-07-05 NOTE — Progress Notes (Signed)
Occupational Therapy Session Note  Patient Details  Name: Dominique [name] MRN: [id number] Date of Birth: 1980/12/13  Today's Date: 07/05/2015 OT Individual Time: 0900-1000 OT Individual Time Calculation (min): 60 min    Short Term Goals: Week 2:  OT Short Term Goal 1 (Week 2): Pt will complete upper body dressing sitting unsupported at EOB with Min assist. OT Short Term Goal 2 (Week 2): Pt will complete grooming sititng at EOB unsupported with supervision and setup OT Short Term Goal 3 (Week 2): Pt will complete slide board transfer, w/c <> raised mat, with +2 assist only (no Maxi-Sky sling used)  Skilled Therapeutic Interventions/Progress Updates:    1:1 Pt in bed when arrived.  Deflated bed and focused on bed mobility to come to EOB on the left side of the bed.  Used sheet rope to assist self into side lying but requires max to problem solve how to use them effectively.  Pt came to EOB with total A of one for bilateral LEs and total A +2 (pt 25%) for her upper body. Once at EOB pt able to maintain sitting balance with supervision for 30 minutes (from close supervision transitioning ot distant supervision with bilateral UE and LE support. Pt held onto back of Shuttle recliner handle bar that was positioned in front of her. Focus on achieving a forward weight shift and heavy weight bearing through LEs. Began with bed in fully lowered position and slowly progressed to elevating bed to 24 inches.  Attempted to stand 10 times throughout session. Pt was able to achieve forward weight shift but unable to lift bottom off surface and extend knees. With last attempt pt able to unweight bottom but unable to fully stand. Pt continues to be fearful of falling.  Therapist positioned on either side of pt for tactile support and cues. Pt requested nutritional consult due to lack of eating here in the hospital.  Discuss with PA and consult initiated.   Therapy Documentation Precautions:  Precautions Precautions:  Fall Precaution Comments: abdominal wound vac, colostomy Required Braces or Orthoses: Other Brace/Splint Other Brace/Splint: abdominal binder when OOB Restrictions Weight Bearing Restrictions: No Pain: C/o abdominal spasms  ADL: ADL ADL Comments: see Functional Assessment  See Function Navigator for Current Functional Status.   Therapy/Group: Individual Therapy  Willeen Cass [facility] 07/05/2015, 3:16 PM

## 2015-07-06 ENCOUNTER — Inpatient Hospital Stay (HOSPITAL_COMMUNITY): Payer: Medicaid Other

## 2015-07-06 ENCOUNTER — Encounter (HOSPITAL_COMMUNITY): Payer: Medicaid Other

## 2015-07-06 ENCOUNTER — Inpatient Hospital Stay (HOSPITAL_COMMUNITY): Payer: Medicaid Other | Admitting: Physical Therapy

## 2015-07-06 LAB — GLUCOSE, CAPILLARY
GLUCOSE-CAPILLARY: 90 mg/dL (ref 65–99)
GLUCOSE-CAPILLARY: 88 mg/dL (ref 65–99)
GLUCOSE-CAPILLARY: 100 mg/dL — AB (ref 65–99)
GLUCOSE-CAPILLARY: 95 mg/dL (ref 65–99)

## 2015-07-06 NOTE — Progress Notes (Signed)
Physical Therapy Session Note  Patient Details  Name: Dominique [name] MRN: [id number] Date of Birth: 1981/01/31  Today's Date: 07/06/2015 PT Individual Time: 1445-1545 PT Individual Time Calculation (min): 60 min   Short Term Goals: Week 2:  PT Short Term Goal 1 (Week 2): Pt will initiate and assist with rolling in bed with mod A of one person PT Short Term Goal 2 (Week 2): Pt will perform supine > sit with mod A of one person PT Short Term Goal 3 (Week 2): Pt will perform transfers bed <> w/c with slideboard and max A of one person PT Short Term Goal 4 (Week 2): Pt will perform w/c mobility in manual w/c x 50' with mod A  PT Short Term Goal 5 (Week 2): Pt will tolerate standing at 80-90 deg with use of tilt table x 10 minutes while performing LE exercises and WB  Skilled Therapeutic Interventions/Progress Updates:    Pt received in bed, napping, agreeable to PT session with encouragement. Therapeutic Activity - see function tab for details. PT used bed controls and +2 assist from tech and sheet rope to assist in transferring to eob. Once EOB, shuttle chair brought in front of pt and locked for UE assist to practice sit to stands. Pt very anxious and fearful re: attempting sit to stand. PT educates pt that she must attempt and PT will keep her safe. Pt explains that after her c-section, she was unable to walk, until one day she was able to stand and just walk. PT explained this is a different situation and that now the pt has had a month of bedrest and must attempt standing, at least to strengthen the muscles used to stand. After sitting balance and LE strengthening exercises, pt attempts sit to stand x 2 reps with bed sheet under pt's bottom while PT and Tech assist with bottom lift, but pt unable to clear bottom or achieve standing either rep. Therapeutic Exercise - PT instructs pt in LE AROM and strengthening exercises EOB: LAQ x 10 reps each side - pt easily completes with R LE, but must wiggle feet  forward/backward on L LE. Neuromuscular Reeducation - PT instructs pt in dynamic sitting balance activities to prepare for sit to stand attempts: lean forward with B arms on back of shuttle chair bar for support, forward lean while pushing down with feet into floor, progressed to forward lean & pushing down with feet & attempting bottom lift x 10 reps each. Pt assisted back to bed with +2 assist after session and all needs in reach. Pt very fearful and anxious during eob dynamic sitting balance and sit to stand activities, with a tear rolling down each cheek, but willing to participate after max encouragement. Once back in bed, pt becomes tearful again, explaining she is afraid she will never walk again and that she doesn't want to live her life in a w/c. PT encourages pt that her biggest limiting factor is not her weight or her weakness, but it is her fear. PT explains that she is not the first bariatric patient our team has worked with and that she has a good potential to achieve standing and return to ambulation, but she must try as hard as she can each session. PT plans to attempt tilt table during tomorrow's session in order to increase weight bearing, decrease pt's fear of upright mobility. Continue per PT POC.   Therapy Documentation Precautions:  Precautions Precautions: Fall Precaution Comments: abdominal wound vac, colostomy Required Braces or  Orthoses: Other Brace/Splint Other Brace/Splint: abdominal binder when OOB Restrictions Weight Bearing Restrictions: No Vital Signs: Therapy Vitals Temp: 98.6 F (37 C) Temp Source: Oral Pulse Rate: (!) 111 (sitting eob, anxious before attempting to stand) Resp: 18 BP: 101/65 mmHg Patient Position (if appropriate): Sitting Oxygen Therapy SpO2: 95 % O2 Device: Not Delivered Pain: Pain Assessment Pain Assessment: 0-10 Pain Score: 4  Pain Type: Acute pain Pain Location: Abdomen Pain Orientation: Left Pain Descriptors / Indicators:  Sore Pain Onset: With Activity Pain Intervention(s): Rest;Emotional support Multiple Pain Sites: No   See Function Navigator for Current Functional Status.   Therapy/Group: Individual Therapy and with Dannielle Huh, PT Tech as +2  Larron Armor M 07/06/2015, 3:12 PM

## 2015-07-06 NOTE — Progress Notes (Signed)
Occupational Therapy Session Note  Patient Details  Name: Dominique [name] MRN: [id number] Date of Birth: 1980/12/10  Today's Date: 07/06/2015 OT Individual Time: 0900-1000 OT Individual Time Calculation (min): 60 min    Short Term Goals: Week 2:  OT Short Term Goal 1 (Week 2): Pt will complete upper body dressing sitting unsupported at EOB with Min assist. OT Short Term Goal 2 (Week 2): Pt will complete grooming sititng at EOB unsupported with supervision and setup OT Short Term Goal 3 (Week 2): Pt will complete slide board transfer, w/c <> raised mat, with +2 assist only (no Maxi-Sky sling used)  Skilled Therapeutic Interventions/Progress Updates: ADL-retraining with focus on static/dynamic sitting balance and improved bed mobility.   Using draw sheet modified as pull rope, pt able to roll left and right with max assist (Pt = 25%) and vc to sequence.   Pt able to lift each breast 50% to aid with rolling as directed and maintain side lying with min assist as helper assists with washing her back and buttocks.   Pt able to rise to sit at EOB to wash upper body (75%) and groom although preferring to maintain balance, intermittently, holding onto bed rail with her left hand.    Pt defers dressing d/t having worn her bra for 2 days resulting in moderate discomfort.   Pt received by physical therapist at end of session.     Therapy Documentation Precautions:  Precautions Precautions: Fall Precaution Comments: abdominal wound vac, colostomy Required Braces or Orthoses: Other Brace/Splint Other Brace/Splint: abdominal binder when OOB Restrictions Weight Bearing Restrictions: No  Pain: No/denies pain    ADL: ADL ADL Comments: see Functional Assessment  See Function Navigator for Current Functional Status.   Therapy/Group: Individual Therapy  [place] 07/06/2015, 2:14 PM

## 2015-07-06 NOTE — Progress Notes (Addendum)
Initial Nutrition Assessment  DOCUMENTATION CODES:   Morbid obesity  INTERVENTION:  Recommend obtaining new weight to fully assess weight trends.   Continue Ensure Enlive po TID, each supplement provides 350 kcal and 20 grams of protein.  Encourage adequate PO intake.   Diet education given.   NUTRITION DIAGNOSIS:   Increased nutrient needs related to wound healing as evidenced by estimated needs.  GOAL:   Patient will meet greater than or equal to 90% of their needs  MONITOR:   PO intake, Supplement acceptance, Weight trends, Labs, I & O's  REASON FOR ASSESSMENT:   Consult Poor PO  ASSESSMENT:   34 y.o. right handed morbidly obese female admitted 06/04/2015 with noted history of asthma, chronic systolic congestive heart failure and recent recurrent colitis since January 2016. Underwent sigmoidoscopy 06/05/2015 showing severe chronic inflammatory changes in the proximal sigmoid and descending colon. Biopsies diagnosed with Crohn disease  Underwent exploratory laparotomy drainage of intra-abdominal abscess partial colon resection application of wound VAC 06/09/2015 per Dr. Grandville Silos followed by reexploration of abdomen with closure creation of colostomy 06/11/2015  Meal completion has been varied from 25-75%. Pt reports confusion with what she can and can not eat with her diet and crohns disease thus has not been eating at her meals. Pt was educated on foods recommended and not recommended with crohns disease. Handout "Crohns disease nutrition therapy" from the Academy of [employer] Diet[employer] Manual was given to patient. Pt expressed understanding of education/discussion. Noted pt with no recent weight recorded. Recommend obtaining new weight to fully assess weight trends. Pt currently has Ensure ordered and has been consuming them. RD to continue with current orders. Pt encouraged to eat her food at meals.   Pt with no observed significant fat or muscle mass loss.   Labs and  medications reviewed.   Diet Order:  DIET SOFT Room service appropriate?: Yes; Fluid consistency:: Thin  Skin:  Wound (see comment) (Incision on abdomen, wound VAC MWF)  Last BM:  10/12 ostomy  Height:   Ht Readings from Last 1 Encounters:  06/03/15 5' 2"  (1.575 m)    Weight:   Wt Readings from Last 1 Encounters:  06/03/15 368 lb 3 oz (167.009 kg)    Ideal Body Weight:  50 kg  BMI:  There is no weight on file to calculate BMI.  Estimated Nutritional Needs:   Kcal:  2200-2400  Protein:  120-130 grams  Fluid:  2.2 - 2.4 L/day  EDUCATION NEEDS:   Education needs addressed  Corrin Parker, MS, RD, LDN Pager # [phone] After hours/ weekend pager # [phone]

## 2015-07-06 NOTE — Progress Notes (Signed)
Physical Therapy Session Note  Patient Details  Name: Dominique [name] MRN: [id number] Date of Birth: 29-Apr-1981  Today's Date: 07/06/2015 PT Individual Time: 1000-1100 PT Individual Time Calculation (min): 60 min   Short Term Goals: Week 2:  PT Short Term Goal 1 (Week 2): Pt will initiate and assist with rolling in bed with mod A of one person PT Short Term Goal 2 (Week 2): Pt will perform supine > sit with mod A of one person PT Short Term Goal 3 (Week 2): Pt will perform transfers bed <> w/c with slideboard and max A of one person PT Short Term Goal 4 (Week 2): Pt will perform w/c mobility in manual w/c x 50' with mod A  PT Short Term Goal 5 (Week 2): Pt will tolerate standing at 80-90 deg with use of tilt table x 10 minutes while performing LE exercises and WB  Skilled Therapeutic Interventions/Progress Updates:    Session focused on addressing functional bed mobility including rolling and sit to supine using sheet rope to assist with trunk and therapist assisting with BLE for repositioning and to don/doff sling for MaxiMove, sitting balance EOB at supervision level with cues for upright posture, Maxi move transfer in and out of bed for safety with pt directing care (remembering to ask caregivers to position foley and wound vac appropriately), seated therex for BLE strengthening including LAQ (AAROM on L), heel/toe raises, abduction (with ball between knees) and adduction (manual resistance) x 10 reps each x 2 sets, and w/c mobility training at supervision level x 50' in straight line for UE strengthening and endurance. Repositioned in bed end of session with all needs in reach and heating pad applied.   Therapy Documentation Precautions:  Precautions Precautions: Fall Precaution Comments: abdominal wound vac, colostomy Required Braces or Orthoses: Other Brace/Splint Other Brace/Splint: abdominal binder when OOB Restrictions Weight Bearing Restrictions: No  Pain: Generalized pain -  repositioned to tolerance.  See Function Navigator for Current Functional Status.   Therapy/Group: Individual Therapy  Canary Brim Ivory Broad, PT, DPT  07/06/2015, 12:05 PM

## 2015-07-07 ENCOUNTER — Inpatient Hospital Stay (HOSPITAL_COMMUNITY): Payer: Medicaid Other | Admitting: Physical Therapy

## 2015-07-07 ENCOUNTER — Inpatient Hospital Stay (HOSPITAL_COMMUNITY): Payer: Medicaid Other

## 2015-07-07 DIAGNOSIS — I998 Other disorder of circulatory system: Secondary | ICD-10-CM

## 2015-07-07 LAB — CBC
HEMATOCRIT: 27.5 % — AB (ref 36.0–46.0)
Hemoglobin: 8.1 g/dL — ABNORMAL LOW (ref 12.0–15.0)
MCH: 24.8 pg — AB (ref 26.0–34.0)
MCHC: 29.5 g/dL — AB (ref 30.0–36.0)
MCV: 84.4 fL (ref 78.0–100.0)
PLATELETS: 351 10*3/uL (ref 150–400)
RBC: 3.26 MIL/uL — ABNORMAL LOW (ref 3.87–5.11)
RDW: 24.5 % — AB (ref 11.5–15.5)
WBC: 7.7 10*3/uL (ref 4.0–10.5)

## 2015-07-07 LAB — GLUCOSE, CAPILLARY
Glucose-Capillary: 96 mg/dL (ref 65–99)
Glucose-Capillary: 93 mg/dL (ref 65–99)
Glucose-Capillary: 103 mg/dL — ABNORMAL HIGH (ref 65–99)
Glucose-Capillary: 98 mg/dL (ref 65–99)

## 2015-07-07 NOTE — Patient Care Conference (Signed)
Inpatient RehabilitationTeam Conference and Plan of Care Update Date: 07/05/2015   Time: 2:10 PM    Patient Name: Dominique [name]      Medical Record Number: [id number]  Date of Birth: 10/15/1980 Sex: Female         Room/Bed: 4M10C/4M10C-01 Payor Info: Payor: MEDICAID [place] / Plan: MEDICAID [place] ACCESS / Product Type: *No Product type* /    Admitting Diagnosis: DEBILITY  Admit Date/Time:  06/26/2015  5:49 PM Admission Comments: No comment available   Primary Diagnosis:  Debilitated Principal Problem: Debilitated  Patient Active Problem List   Diagnosis Date Noted  . Adjustment disorder with mixed anxiety and depressed mood   . Debilitated 06/26/2015  . Acute renal failure syndrome ([place])   . Cardiomyopathy- etiology not yet determined 06/20/2015  . Chronic systolic heart failure ([place])   . Pulmonary hypertension ([place])   . Chronic systolic CHF (congestive heart failure) ([place])   . Acute respiratory failure with hypoxia ([place])   . Crohn's disease ([place])   . Blood poisoning ([place])   . Perforated sigmoid colon ([place])   . Peritonitis ([place])   . Peritoneal abscess ([place])   . OSA on CPAP   . Obesity hypoventilation syndrome ([place])   . Acute renal insufficiency   . Acute encephalopathy   . Pressure ulcer 06/16/2015  . Protein-calorie malnutrition, severe ([place]) 06/10/2015  . Colitis 06/04/2015  . Generalized abdominal pain   . Gastritis 04/07/2015  . Iron deficiency anemia 04/07/2015  . Sinus tachycardia ([place]) 12/29/2014  . Lumbar back pain 12/06/2014  . Fluid retention 12/01/2014  . Abdominal pain   . Fall 11/20/2014  . Rectal bleeding 11/19/2014  . Diverticulitis of colon 11/17/2014  . Hepatic steatosis 11/17/2014  . Elevated LFTs 11/17/2014  . Seasonal allergies 06/13/2014  . OSA (obstructive sleep apnea) 05/02/2014  . Thyromegaly 05/02/2014  . AMENORRHEA 02/07/2010  . Morbid obesity- BMI 67.5 03/22/2009    Expected Discharge Date: Expected Discharge Date: 07/20/15  Team  Members Present: Physician leading conference: Dr. Delice Lesch Social Worker Present: Ovidio Kin, LCSW Nurse Present: Heather Roberts, RN PT Present: [place] Shi, PT OT Present: Willeen Cass, OT PPS Coordinator present : Daiva Nakayama, RN, CRRN     Current Status/Progress Goal Weekly Team Focus  Medical   Improving, Wound care ongoing  Stable colostomy and wound  Assessment of colostomy by surgery and monitoring of electrolytes and hemotological parameters   Bowel/Bladder   New ostomy- WOC following- goog output; foley for retention  Mod assist  Assess for patients ability to provide care to ostomy   Swallow/Nutrition/ Hydration   Poor appetite; refuses beneprotein         ADL's   total A +2 for all bed mobility, attempted a transfer via slide board with support of maxi sky; +2 for ADLs tasks (no toileting at this time on the [facility])  Supervision for transfers and iADL, Overall Min Assist for BADL; may need to downgraded  improving activity tolerance, sitting balance and tolerance, out of bed activity, transfers,    Mobility   +2 assist for bed mobility, maxi-move for transfers, w/c propulsion min-mod A short distance, standing in tilt table, +3 assist slideboard transfers  min-mod A (goals downgraded due to slow progress)  activity tolerance, sitting balance, slideboard transfers, standing in tilt table, B LE strengthening   Communication             Safety/Cognition/ Behavioral Observations            Pain  Norco 2 tabs and robaxin 500 mg effective in controlling patients pain  < 4  Assess and treat for pain q shift and prn   Skin   Wound vac to abdominal dressing changed by [place] nurse MWF; woc managing ostomy  Max assist  Perform dressing changes as MD ordered; encourage protein intake    Rehab Goals Patient on target to meet rehab goals: Yes Rehab Goals Revised: Some of pt's goals have been downgraded due to slow progress. *See Care Plan and progress notes for long and  short-term goals.  Barriers to Discharge: Abnormal electrolytes, wounds, and colostomy    Possible Resolutions to Barriers:  Surgery evaluating colostomy, supplements started    Discharge Planning/Teaching Needs:  Pt will return to her apartment at d/c where her mother will be with her to provide 24/7 supervision and some minimal assistance.  It is anticipated that pt will need assistance with wound VAC.  Pt's mother is willing to assist pt during her recovery, but it would probably be best to have mother come for family education to determine her ability to provide the care necessary.  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  wound and skin care; level of assistance needed at d/c   Team Discussion:  Pt sat at the edge of the bed for 25 minutes on day of conference.  She continues to have a fear of falling.  Is doing slide board transfers.  Pt still requires +2 for bed mobility and OT questions if this is due to air overlay mattress, but we do not have a regular bariatric bed for her to use.  Pt is slowly progressing and dietician is to see her for nutrition.  Pt has 8 stairs to enter the home and team discussed if LTACH may be necessary.  Pt has been having muscle spasms in her stomach.    Revisions to Treatment Plan:  Goals have been downgraded to min assist and team wants to see her progress over the week and discuss her again at team conference, evaluating for possible extension.   Continued Need for Acute Rehabilitation Level of Care: The patient requires daily medical management by a physician with specialized training in physical medicine and rehabilitation for the following conditions: Daily direction of a multidisciplinary physical rehabilitation program to ensure safe treatment while eliciting the highest outcome that is of practical value to the patient.: Yes Daily medical management of patient stability for increased activity during participation in an intensive  rehabilitation regime.: Yes Daily analysis of laboratory values and/or radiology reports with any subsequent need for medication adjustment of medical intervention for : Post surgical problems;Other  Alee Katen, Silvestre Mesi 07/07/2015, 2:22 PM

## 2015-07-07 NOTE — Consult Note (Signed)
Hospital Consult    Reason for Consult:  Ischemic right tip of 5th finger Referring Physician:  CIR MRN #:  [id number]  History of Present Illness: This is a 34 y.o. female who was admitted on June 04, 2015 with c/o severe abdominal pain that started earlier in the year.  She did have diarrhea with occasional vomiting.  She was unable to see a GI doctor due to her insurance.  Once that was approved, she had a CT scan.  This was discussed with the pt and she was to continue her course of abx and return to see MD the next week.  Before her appt, her abdominal pain got more severe and diffuse and was associated with vomiting 2-3x/day.    She was admitted to the hospital and found to have a bx consistent with Crohn's dz.  She also became septic and diagnosed with a perforated left colon and underwent emergent exploratory laparotomy on 06/09/15 with partial colon resection and drainage of intra abdominal abscess.  She did return to the OR on 9/18 for re-exploration and abdominal closure and creation of a colostomy.    During her hospitalization, she did have persistent sinus tachycardia.  There was no evidence of PE on angiogram on 9/16 and TSH was normal.  It was thought this was likely due to CHF and is now on a beta blocker.  She states that she has not noticed any irregular heart rate or palpitations.  She did have a TTE on 06/18/15 and was found to have an EF of 20% with diffuse hypokinesis that was felt to be due to stress/critical illness and it was felt this would improve.  No atrial thrombus was identified.  She states that she does have anemia but denies any hematochezia or hematuria.  She states she has family hx of anemia.  She also had anemia of critical illness and her hgb has remained stable with no evidence of blood loss.  She also did have ARF due to septic shock and ATN, but this has since resolved & creatinine is now 1.07.  She does have hx of asthma and does use inhalers.  She  has hx of OSA.  She is on a beta blocker for her ST.  She denies CP/MI, CVA, diabetes.  She has been in CIR since 06/26/15.  VVS is consulted today for ischemic changes on the tip of her right 5th finger.  She states that she did not have this when she came into the hospital and was not present until sometime after her surgery.  She states that it has gotten darker.  She does have sensation, but it is decreased over the tip of her finger.  She can move her finger also.  During her hospitalization, she was on a neo gtt for a few days for pressure support.  She is on SQ heparin for DVT prophylaxis.  She is morbidly obese with a BMI of 67.   Past Medical History  Diagnosis Date  . Asthma   . Sleep apnea   . Anemia   . Morbid obesity ([place]) 03/22/2009    Qualifier: Diagnosis of  By: Ronnald Ramp CMA, [place]    . OSA (obstructive sleep apnea) 05/02/2014  . Thyromegaly 05/02/2014  . Seasonal allergies 06/13/2014  . Acute acalculous cholecystitis 11/16/2014  . Diverticulitis of colon 11/17/2014  . Hepatic steatosis 11/17/2014    Past Surgical History  Procedure Laterality Date  . C section 2011  2011  . Flexible sigmoidoscopy N/A  06/05/2015    Procedure: FLEXIBLE SIGMOIDOSCOPY;  Surgeon: Ronald Lobo, MD;  Location: [facility];  Service: Endoscopy;  Laterality: N/A;  . Laparotomy N/A 06/09/2015    Procedure: EXPLORATORY LAPAROTOMY, DRAINAGE OF INTRAABDOMINAL ABSCESS, PARTIAL COLON RESECTION,  APPLICATION OF WOUND VAC;  Surgeon: Georganna Skeans, MD;  Location: [facility];  Service: General;  Laterality: N/A;  . Laparotomy N/A 06/11/2015    Procedure: RE-EXPLORATION OF ABDOMEN;  Surgeon: Georganna Skeans, MD;  Location: [facility];  Service: General;  Laterality: N/A;  . Colostomy N/A 06/11/2015    Procedure:  CREATION OF COLOSTOMY;  Surgeon: Georganna Skeans, MD;  Location: [facility];  Service: General;  Laterality: N/A;    Allergies  Allergen Reactions  . Other Shortness Of Breath and Swelling    Tree nuts  .  Penicillins Other (See Comments)    Unknown childhood allergy    Prior to Admission medications   Medication Sig Start Date End Date Taking? Authorizing Provider  ferrous sulfate 325 (65 FE) MG tablet Take 2 tablets (650 mg total) by mouth daily with breakfast. 04/07/15  Yes Midge Minium, MD  furosemide (LASIX) 40 MG tablet Take 1 tablet (40 mg total) by mouth 2 (two) times daily. 06/26/15  Yes Verlee Monte, MD  mesalamine (PENTASA) 250 MG CR capsule Take 2 capsules (500 mg total) by mouth 4 (four) times daily. 06/26/15  Yes Verlee Monte, MD  metoprolol succinate (TOPROL XL) 50 MG 24 hr tablet Take 1 tablet (50 mg total) by mouth daily. Take with or immediately following a meal. 06/26/15  Yes Verlee Monte, MD  ondansetron (ZOFRAN) 4 MG tablet Take 1 tablet (4 mg total) by mouth every 6 (six) hours. 05/25/15  Yes Heather Laisure, PA-C  polyethylene glycol (MIRALAX / GLYCOLAX) packet Take 17 g by mouth daily. 06/26/15  Yes Verlee Monte, MD  PROAIR HFA 108 (90 BASE) MCG/ACT inhaler INHALE 2 PUFFS BY MOUTH INTO THE LUNGS EVERY 6 HOURS AS NEEDED FOR WHEEZING OR SHORTNESS OF BREATH 02/17/15  Yes Midge Minium, MD  saccharomyces boulardii (FLORASTOR) 250 MG capsule Take 1 capsule (250 mg total) by mouth 2 (two) times daily. 06/26/15  Yes Verlee Monte, MD  albuterol (PROVENTIL) (2.5 MG/3ML) 0.083% nebulizer solution Take 3 mLs (2.5 mg total) by nebulization every 6 (six) hours as needed for wheezing or shortness of breath. Patient not taking: Reported on 06/03/2015 10/27/14   Rigoberto Noel, MD    Social History   Social History  . Marital Status: Single    Spouse Name: N/A  . Number of Children: N/A  . Years of Education: N/A   Occupational History  . Not on file.   Social History Main Topics  . Smoking status: Former Smoker -- 0.50 packs/day for 4 years    Types: Cigarettes    Start date: 07/24/2014  . Smokeless tobacco: Never Used  . Alcohol Use: No  . Drug Use: No  . Sexual Activity:  Not on file   Other Topics Concern  . Not on file   Social History Narrative     Family History  Problem Relation Age of Onset  . Hypertension Mother   . Hypertension Father   . Hypertension Sister   . Asthma Brother   . Diabetes Maternal Grandmother   . Hypertension Maternal Grandmother   . Hypertension Maternal Grandfather   . Cancer Paternal Grandmother     lung  . Emphysema Paternal Grandfather   . Allergies Mother     ROS: [  x] Positive   [ ]  Negative   [ ]  All sytems reviewed and are negative  Cardiovascular: []  chest pain/pressure [x]  Sinus tachycardia []  palpitations []  SOB lying flat []  DOE []  pain in legs while walking []  pain in legs at rest []  pain in legs at night []  non-healing ulcers []  hx of DVT []  swelling in legs  Pulmonary: []  productive cough [x]  asthma/wheezing [x]  OSA []  home O2  Neurologic: []  weakness in []  arms []  legs []  numbness in []  arms []  legs []  hx of CVA []  mini stroke [] difficulty speaking or slurred speech []  temporary loss of vision in one eye []  dizziness  Hematologic: []  hx of cancer []  bleeding problems []  problems with blood clotting easily [x]  anemia  Endocrine:   []  diabetes [x]  thyromegaly  GI [x]  vomiting/diarrhea prior to admission []  blood in stool [x]  crohn's dz with recent partial colectomy/colostomy  GU: []  CKD/renal failure []  HD--[]  M/W/F or []  T/T/S [x]  recent ARF - resolved []  burning with urination []  blood in urine  Psychiatric: []  anxiety []  depression  Musculoskeletal: []  arthritis []  joint pain  Integumentary: []  rashes []  ulcers  Constitutional: []  fever []  chills [x]  obesity   Physical Examination  Filed Vitals:   07/07/15 0512  BP: 106/53  Pulse: 103  Temp: 98.8 F (37.1 C)  Resp: 18   There is no weight on file to calculate BMI.  General:  WDWN obese female in NAD Gait: Not observed HENT: WNL, normocephalic Pulmonary: normal non-labored breathing, without  Rales, rhonchi,  wheezing Cardiac: regular and tachy, without  Murmurs, rubs or gallops; without carotid bruits Abdomen: soft, NT/ND, no masses Skin: without rashes, without ulcers  Vascular Exam/Pulses:  Right Left  Radial 2+ (normal) 2+ (normal)  Ulnar 2+ (normal) 2+ (normal)  DP 2+ (normal) 2+ (normal)  PT Unable to palpate  Unable to palpate    Extremities: ischemic area of the distal portion of the right 5th finger; sensation decreased distally, but intact on the proximal finger.  Motor is in tact. Musculoskeletal: no muscle wasting or atrophy  Neurologic: A&O X 3; Appropriate Affect ; SENSATION: normal; MOTOR FUNCTION:  moving all extremities equally. Speech is fluent/normal Psychiatric:  Appears capable of medical decision making    CBC    Component Value Date/Time   WBC 7.7 07/07/2015 0955   RBC 3.26* 07/07/2015 0955   RBC 4.95 06/07/2015 0940   HGB 8.1* 07/07/2015 0955   HCT 27.5* 07/07/2015 0955   PLT 351 07/07/2015 0955   MCV 84.4 07/07/2015 0955   MCH 24.8* 07/07/2015 0955   MCHC 29.5* 07/07/2015 0955   RDW 24.5* 07/07/2015 0955   LYMPHSABS 0.5* 06/27/2015 0543   MONOABS 1.0 06/27/2015 0543   EOSABS 0.2 06/27/2015 0543   BASOSABS 0.0 06/27/2015 0543    BMET    Component Value Date/Time   NA 138 06/27/2015 0543   K 3.4* 06/27/2015 0543   CL 104 06/27/2015 0543   CO2 29 06/27/2015 0543   GLUCOSE 91 06/27/2015 0543   BUN 21* 06/27/2015 0543   CREATININE 1.07* 06/27/2015 0543   CREATININE 0.52 12/29/2014 1557   CALCIUM 7.6* 06/27/2015 0543   GFRNONAA >60 06/27/2015 0543   GFRAA >60 06/27/2015 0543    COAGS: Lab Results  Component Value Date   INR 1.60* 06/11/2015   INR 1.46 11/17/2014   INR 1.17 07/12/2010     Non-Invasive Vascular Imaging:   none  Statin:  No. Beta Blocker:  Yes.  Aspirin:  No. ACEI:  No. ARB:  No. Other antiplatelets/anticoagulants:  Yes, SQ heparin   ASSESSMENT/PLAN: This is a 34 y.o. female who has had a  complicated hospitalization with partial colectomy/colostomy for perforated colon secondary to Crohn's Dz with sepsis now in CIR with ischemia of the distal right 5th finger.   -pt does have easily palpable right radial and ulnar pulses.   -possible embolization, however, she did have an echo, which did not reveal any left atrial thrombus -she did require a few days of Phenylephrine and could've had some vasoconstriction causing the ischemia -Dr. Trula Slade will be in to see pt this afternoon.   Leontine Locket, PA-C [facility] [phone]  I agree with the above.  I have seen and evaluated the patient.  She has a dry eschar and ischemic changes to the tip of the right index finger.  She has a palpable radial pulse.  I would recommend dry dressings for now.  The patient does not have any pain or evidence of infection, so no acute intervention is needed.  She will need a hand surgeon for amputation if it comes to this.  This could be done as an outpatient.  If hand surgery would like an angiogram, I will be happy to perform this.  Annamarie Major

## 2015-07-07 NOTE — Consult Note (Signed)
WOC follow-up: Ostomy pouching: Ostomy pouch changed today related to leakage behind barrier. CCS team at bedside this AM to assess abd wound and stoma. Applied 2 piece pouch with barrier ring. Stoma is red and moist with decreasing amt slough, slightly above skin level, 1 1/2 inches, mucutaneous separation occurring to 50% of peristomal edges, from 6:00 o'clock to 12:00 o'clock with large amt thick tan pus and some odor.Red granulating tissue underneath when pus is absorbed.  Mod amt thick tan drainage in pouch, currently no stool. Education provided: Demonstrated pouch change using 2 piece pouch and a barrier ring to attempt to maintain seal. Applied strip of Aquacel packing into separation edges of stoma. Enrolled patient in [place] program: No Ostomy supplies and instructions at the bedside for nurse use. Pt watched procedure and asked appropriate questions. She will need total assistance with pouching upon discharge until this complex problem is resolved.  Wound type: Full thickness midline abd wound, CCS following for assessment and plan of care.Changed Vac dressing. no further pus at this time, no odor. Full thickness post-op wound 95% red, 5% yellow slough in patchy areas. Applied Mepitel contact layer, then one piece of white foam to lower inner wound, then one piece of black foam to 168m cont suction. Pt tolerated with mod amt discomfort. Plan to change dressing on M/W/F at 0800.  DJulien GirtMSN, RN, CWOCN, CWCN-AP, CNS

## 2015-07-07 NOTE — Progress Notes (Addendum)
Physical Therapy Session Note  Patient Details  Name: Dominique [name] MRN: [id number] Date of Birth: 1981-07-21  Today's Date: 07/07/2015 PT Individual Time: 1130-1200 Treatment Session 2: 1430-1610 PT Individual Time Calculation (min): 30 min Treatment Session 2: 100 min  Short Term Goals: Week 2:  PT Short Term Goal 1 (Week 2): Pt will initiate and assist with rolling in bed with mod A of one person PT Short Term Goal 2 (Week 2): Pt will perform supine > sit with mod A of one person PT Short Term Goal 3 (Week 2): Pt will perform transfers bed <> w/c with slideboard and max A of one person PT Short Term Goal 4 (Week 2): Pt will perform w/c mobility in manual w/c x 50' with mod A  PT Short Term Goal 5 (Week 2): Pt will tolerate standing at 80-90 deg with use of tilt table x 10 minutes while performing LE exercises and WB  Skilled Therapeutic Interventions/Progress Updates:    Treatment Session 1: Pt received up in w/cwith wound vac and foley in place, agreeable to PT. W/C Management - see function tab for details - pt req min A for 49' with verbal cues to push harder wit hR arm as w/c starts pulling to the R. Therapeutic Activity - See function tab for details - maxi move transfer w/c to bed and pt rolls R/L in bed with sheet rope/bedrail with one person assist to remove sling. Pt is slowly progressing w/c propulsion distance and ability to roll side to side with less assist req.   Treatment Session 2: Pt received in bed, agreeable to PT session. Therapeutic Activity - Maximove transfer bed to/from w/c and w/c to/from tilt table. On tilt table, pt progressively increased to 65 degrees and tolerates a total of 15 minutes in this upright position. Therapeutic Exercise - B LE AROM and strengthening exercises - R/L heel slides in supine, LAQ from w/c x 10 reps each. In 55-65 degrees of stand - mini squats 3 x 10 reps, arm reach to ~ 90 degrees 2 x 10 reps. W/C Management - PT instructs pt in w/c  propulsion x 70' with B UEs req SBA and intermittent min A as w/c pulls to R. PT educates pt re: orthostatic hypotension in (partial) stand due to prolonged bed rest and not standing from medical complications. Pt ended in bed with all needs in reach. Pt is continuing to make progress, demonstrating ability to lift L leg anti-gravity off the floor for the first time today, rolling with assist for placement of feet and movement of breast only for first time today, and tolerating highest degree tilt towards upright on tilt table for longest amount of time, today.  Continue per PT POC.    Therapy Documentation Precautions:  Precautions Precautions: Fall Precaution Comments: abdominal wound vac, colostomy Required Braces or Orthoses: Other Brace/Splint Other Brace/Splint: abdominal binder when OOB Restrictions Weight Bearing Restrictions: No Vital Signs: In stand after 15 minutes at 65 degrees on tilt table: 74/33 mmHg, 136 bpm, 98% SpO2 In supine after 5 minute rest break: 93/71 mmHg, 91 bpm, 93% SpO2  Pain: Pain Assessment Pain Assessment: No/denies pain Treatment Session 2: Pt reports 5/10 pain in L abdomen at 65 degrees of standing on tilt table - this pain abolished when tilted back to supine.    See Function Navigator for Current Functional Status.   Therapy/Group: Individual Therapy; Letta Moynahan, PT during 2nd session for clinical specialist mentorship.   [facility] M 07/07/2015, 12:19 PM

## 2015-07-07 NOTE — Progress Notes (Signed)
Occupational Therapy Session Note  Patient Details  Name: Dominique [name] MRN: [id number] Date of Birth: 07/06/1981  Today's Date: 07/07/2015 OT Individual Time: 0930-1100 OT Individual Time Calculation (min): 90 min    Short Term Goals: Week 2:  OT Short Term Goal 1 (Week 2): Pt will complete upper body dressing sitting unsupported at EOB with Min assist. OT Short Term Goal 2 (Week 2): Pt will complete grooming sititng at EOB unsupported with supervision and setup OT Short Term Goal 3 (Week 2): Pt will complete slide board transfer, w/c <> raised mat, with +2 assist only (no Maxi-Sky sling used)  Skilled Therapeutic Interventions/Progress Updates: ADL-retraining at bed level with focus on improved bed mobility during assisted BADL.   Pt only able to bathe upper body during this session and reports fear of plan for continued unsupported sitting balance activity d/t increased fatigue and weakness at LUE, which she relates to as resulting from her performing HEP previous night using orange thera-band.  Pt also c/o of reaction to meds delivered later than expected as increasing her nausea/dizzyness while laying supine.   Pt required extensive assist this session in all areas expect bed mobility however she continues to require cues to bend her knees to elevate pelvis.   OT provides assist with lower body bathing after instructing pt on circle sitting posture to improve access to perineal area for caregivers.   Pt reports that she has not started her menstrual cycle and she is now late; OT alerts RN to need for f/u.   Pt requires mech lift transfer with +2 helper needed to manage medical equipment and assist with placement of sling and binder during bed mobility.   Pt transfers to w/c but complains of pain at her right thigh with use of elevating leg rests.  OT provides standard leg rests which relieves symptoms.   Pt left in w/c at end of session with all needs within reach.     Therapy  Documentation Precautions:  Precautions Precautions: Fall Precaution Comments: abdominal wound vac, colostomy Required Braces or Orthoses: Other Brace/Splint Other Brace/Splint: abdominal binder when OOB Restrictions Weight Bearing Restrictions: No  Pain: Pain Assessment Pain Assessment: No/denies pain Pain Score: 4    ADL: ADL ADL Comments: see Functional Assessment  See Function Navigator for Current Functional Status.   Therapy/Group: Individual Therapy  [place] 07/07/2015, 2:52 PM

## 2015-07-07 NOTE — Progress Notes (Signed)
Patient has refused CPAP at this time. No signs of apparent respiratory distress and informed to let us know if she wants [place] to bring her one at any time. RT will continue to monitor.

## 2015-07-07 NOTE — Progress Notes (Addendum)
Patient ID: Dominique [name], female   DOB: 1981/02/03, 34 y.o.   MRN: [id number]    Subjective: Pt feels ok.  No major complaints.  Putting some stool in to her bag, but over the last day or so has become more purulent drainage and less stool  Objective: Vital signs in last 24 hours: Temp:  [98.6 F (37 C)-98.8 F (37.1 C)] 98.8 F (37.1 C) (10/14 0512) Pulse Rate:  [103-111] 103 (10/14 0512) Resp:  [18] 18 (10/14 0512) BP: (101-106)/(53-65) 106/53 mmHg (10/14 0512) SpO2:  [95 %-99 %] 99 % (10/14 0512) Last BM Date: 07/06/15  Intake/Output from previous day: 10/13 0701 - 10/14 0700 In: 360 [P.O.:360] Out: 2310 [Urine:2150; Drains:160] Intake/Output this shift:    PE: Abd: soft, midline wound is mostly all clean.  VAC replaced.  Stoma is attached on the lateral side from about 2-5 oclock.  It is still separated on all other sides with purulent drainage well up on the medial. 9 oclock side  Lab Results:  No results for input(s): WBC, HGB, HCT, PLT in the last 72 hours. BMET No results for input(s): NA, K, CL, CO2, GLUCOSE, BUN, CREATININE, CALCIUM in the last 72 hours. PT/INR No results for input(s): LABPROT, INR in the last 72 hours. CMP     Component Value Date/Time   NA 138 06/27/2015 0543   K 3.4* 06/27/2015 0543   CL 104 06/27/2015 0543   CO2 29 06/27/2015 0543   GLUCOSE 91 06/27/2015 0543   BUN 21* 06/27/2015 0543   CREATININE 1.07* 06/27/2015 0543   CREATININE 0.52 12/29/2014 1557   CALCIUM 7.6* 06/27/2015 0543   PROT 4.3* 06/27/2015 0543   ALBUMIN 1.4* 06/27/2015 0543   AST 28 06/27/2015 0543   ALT 20 06/27/2015 0543   ALKPHOS 102 06/27/2015 0543   BILITOT 0.6 06/27/2015 0543   GFRNONAA >60 06/27/2015 0543   GFRAA >60 06/27/2015 0543   Lipase     Component Value Date/Time   LIPASE 10* 06/09/2015 1325       Studies/Results: No results found.  Anti-infectives: Anti-infectives    Start     Dose/Rate Route Frequency Ordered Stop   06/30/15 2000   ciprofloxacin (CIPRO) tablet 250 mg  Status:  Discontinued     250 mg Oral 2 times daily 06/30/15 1716 07/02/15 1418       Assessment/Plan  Perforated descending colon, colitis POD 24,26 s/p ex lap with DRAINAGE OF INTRA-ABDOMINAL ABSCESS, PARTIAL COLON RESECTION, re-exploration and creation of colostomy and abdominal closure---Dr. Lanier Clam - wound is clean.  Cont wound VAC -stoma--mucocutaneous separation, pink and viable, continue purulent drainage 3-12 oclock -miralax -up with therapies -abd binder -no fevers. WBC 11K 4 days ago.  Will recheck this, but do not see any evidence of need for CT scan or any other follow up except watching this for right now, given the purulent drainage appears to be similar to 3 days ago.  ADDENDUM: WBC 7K.  No indications right now for further imaging given normal WBC and no fevers.  Will continue to follow wound and ostomy.  LOS: 11 days    Dominique [name] 07/07/2015, 8:31 AM Pager: [phone]

## 2015-07-07 NOTE — Progress Notes (Addendum)
Subjective/Complaints: No issues overnite Denies buttocks pain Has midline abd pain ROS:  No CP or SOB, no increased limb swelling Objective: Vital Signs: Blood pressure 106/53, pulse 103, temperature 98.8 F (37.1 C), temperature source Oral, resp. rate 18, SpO2 99 %. No results found. Results for orders placed or performed during the hospital encounter of 06/26/15 (from the past 72 hour(s))  Glucose, capillary     Status: Abnormal   Collection Time: 07/04/15 12:16 PM  Result Value Ref Range   Glucose-Capillary 108 (H) 65 - 99 mg/dL   Comment 1 Notify RN   Glucose, capillary     Status: None   Collection Time: 07/04/15  4:36 PM  Result Value Ref Range   Glucose-Capillary 79 65 - 99 mg/dL   Comment 1 Notify RN   Glucose, capillary     Status: Abnormal   Collection Time: 07/04/15  9:27 PM  Result Value Ref Range   Glucose-Capillary 113 (H) 65 - 99 mg/dL  Glucose, capillary     Status: None   Collection Time: 07/05/15  6:32 AM  Result Value Ref Range   Glucose-Capillary 97 65 - 99 mg/dL  Glucose, capillary     Status: None   Collection Time: 07/05/15 11:27 AM  Result Value Ref Range   Glucose-Capillary 85 65 - 99 mg/dL  Glucose, capillary     Status: Abnormal   Collection Time: 07/05/15  4:13 PM  Result Value Ref Range   Glucose-Capillary 114 (H) 65 - 99 mg/dL  Glucose, capillary     Status: Abnormal   Collection Time: 07/05/15  8:28 PM  Result Value Ref Range   Glucose-Capillary 128 (H) 65 - 99 mg/dL  Glucose, capillary     Status: None   Collection Time: 07/06/15  6:51 AM  Result Value Ref Range   Glucose-Capillary 90 65 - 99 mg/dL  Glucose, capillary     Status: None   Collection Time: 07/06/15 11:15 AM  Result Value Ref Range   Glucose-Capillary 88 65 - 99 mg/dL  Glucose, capillary     Status: Abnormal   Collection Time: 07/06/15  4:13 PM  Result Value Ref Range   Glucose-Capillary 100 (H) 65 - 99 mg/dL  Glucose, capillary     Status: None   Collection Time:  07/06/15  9:22 PM  Result Value Ref Range   Glucose-Capillary 95 65 - 99 mg/dL  Glucose, capillary     Status: None   Collection Time: 07/07/15  6:44 AM  Result Value Ref Range   Glucose-Capillary 96 65 - 99 mg/dL     HEENT: light sensitive Right eye Cardio: RRR and no murmur Resp: CTA B/L and unlabored GI: BS positive and LLQ colostomy, midline open abd wound with Wet to Dry ,  Extremity:  No Edema Skin:   Wound see above, no heel ulceration Neuro: Alert/Oriented and Abnormal Motor 5/5 in BUE, 3- bilateral HF,able to lift heel off bed ~3 inches when supine 4- Knee ext and ankle DF/PF Musc/Skel:  Other no pain with UE and LE ROM Gen NAD   Assessment/Plan: 1. Functional deficits secondary to debilitation/Crohn's disease/perforated left colon with peritonitis/exploratory laparotomy with drainage of intra-abdominal abscess partial colon resection with colostomy which require 3+ hours per day of interdisciplinary therapy in a comprehensive inpatient rehab setting. Physiatrist is providing close team supervision and 24 hour management of active medical problems listed below. Physiatrist and rehab team continue to assess barriers to discharge/monitor patient progress toward functional and medical goals.  FIM: Function - Bathing Position: Sitting EOB Body parts bathed by patient: Right arm, Left arm, Chest Body parts bathed by helper: Right lower leg, Left lower leg, Buttocks, Back, Abdomen Bathing not applicable: Abdomen, Buttocks, Front perineal area (peri care done prior to OT session) Assist Level: Touching or steadying assistance(Pt > 75%)  Function- Upper Body Dressing/Undressing What is the patient wearing?: Hospital gown Bra - Perfomed by patient: Thread/unthread right bra strap, Thread/unthread left bra strap Bra - Perfomed by helper: Hook/unhook bra (pull down sports bra) Pull over shirt/dress - Perfomed by patient: Thread/unthread right sleeve, Thread/unthread left  sleeve Pull over shirt/dress - Perfomed by helper: Put head through opening, Pull shirt over trunk Assist Level: Touching or steadying assistance(Pt > 75%) Set up : To obtain clothing/put away Function - Lower Body Dressing/Undressing What is the patient wearing?: Pants Position: Bed Pants- Performed by patient: Pull pants up/down Pants- Performed by helper: Thread/unthread right pants leg, Thread/unthread left pants leg, Pull pants up/down Non-skid slipper socks- Performed by helper: Don/doff right sock, Don/doff left sock Assist for lower body dressing: Touching or steadying assistance (Pt > 75%)  Function - Toileting Toileting activity did not occur: Safety/medical concerns Toileting steps completed by patient: Adjust clothing prior to toileting, Performs perineal hygiene, Adjust clothing after toileting Toileting steps completed by helper: Adjust clothing prior to toileting, Performs perineal hygiene, Adjust clothing after toileting Assist level: Touching or steadying assistance (Pt.75%)  Function - Air cabin crew transfer activity did not occur: Safety/medical concerns Toilet transfer assistive device: Mechanical lift Mechanical lift: [place] Assist level to toilet: 2 helpers Assist level from toilet: 2 helpers  Function - Chair/bed transfer Chair/bed transfer activity did not occur: Safety/medical concerns Chair/bed transfer method: Other Chair/bed transfer assist level: 2 helpers Chair/bed transfer assistive device: Mechanical lift Mechanical lift: [place] Chair/bed transfer details: Verbal cues for safe use of DME/AE, Manual facilitation for placement  Function - Locomotion: Wheelchair Will patient use wheelchair at discharge?: Yes Type:  (tbd) Wheelchair activity did not occur: Safety/medical concerns (debility and awaiting appropriate equipment) Max wheelchair distance: 50 Assist Level: Supervision or verbal cues Assist Level: Dependent (Pt equals  0%) Assist Level: Dependent (Pt equals 0%) Turns around,maneuvers to table,bed, and toilet,negotiates 3% grade,maneuvers on rugs and over doorsills: No Function - Locomotion: Ambulation Ambulation activity did not occur: Safety/medical concerns (significant debility)  Function - Comprehension Comprehension: Auditory Comprehension assist level: Follows complex conversation/direction with no assist  Function - Expression Expression: Verbal Expression assist level: Expresses complex ideas: With no assist  Function - Social Interaction Social Interaction assist level: Interacts appropriately with others with medication or extra time (anti-anxiety, antidepressant).  Function - Problem Solving Problem solving assist level: Solves complex problems: With extra time  Function - Memory Memory assist level: Complete Independence: No helper Patient normally able to recall (first 3 days only): Current season, Location of own room, Staff names and faces, That he or she is in a hospital   Medical Problem List and Plan: 1. Functional deficits secondary to debilitation/Crohn's disease/perforated left colon with peritonitis/exploratory laparotomy with drainage of intra-abdominal abscess partial colon resection with colostomy. Continue Pentasa 500 mg 4 times a day 2.  DVT Prophylaxis/Anticoagulation: Subcutaneous heparin. Monitor platelet counts, currently noi signs of bleeding 3. Pain Management: Hydrocodone as needed 4. Acute blood loss anemia. Monitor while on heparin, hgb 8.0 on 10/10 5. Neuropsych: This patient is capable of making decisions on her own behalf. 6. Skin/Wound Care: dehiscence noted at muco cutaneous jct of stoma- CCS  seeing pt on M-W-F and WOC RN following, s/p placement of wound vac  7. Fluids/Electrolytes/Nutrition: Strict I&O with follow-up chemistries 8. Acute renal insufficiency 2.64-2.84. Creatinine latest result 1.09. Follow-up chemistries stable, ? If lower dreat is related  to loss of muscle masss 9. Acute respiratory failure/VDRF. Extubated 06/14/2015.propable obesity hypoventilation syndrome, cont PRN O2 10. SVT. Monitor heart rate. Lopressor 50 mg twice a day, pt also severely deconditioned with poor exercise tolerance, am HR 103, may need to adjust lopressor 11. Chronic systolic congestive heart failure. Monitor for any signs of fluid overload. Lasix 40 mg twice a day, monitor intake and creat, no SOB, sats ok, no change in dose at present 12.  UTI ecoli- S to nitro, cont abx x 10d, afebrile 13. Morbid obesity. Body mass index 67.33 14.  Mild hypokalemia - will supplement  15.  Severe hypoalbuminemia- supplement , this is contributing to delayed wound healing , doesn't tolerate benprotein but drinks ensure  LOS (Days) 11 A FACE TO FACE EVALUATION WAS PERFORMED  Kamari Buch E 07/07/2015, 7:54 AM

## 2015-07-07 NOTE — Consult Note (Signed)
Neuropsychology Psychosocial - Confidential Advance _____________________________________________________________________________   [name] is a 34 year old woman, who was previously seen for a Psychodiagnostic evaluation to examine her emotional functioning in the setting of debilitation.  She reported symptoms consistent with adjustment disorder with depression and anxiety.  The initial session focused on management of anxiety to help improve participation in therapies, but it was recommended that she have a follow-up session with the neuropsychologist to help address depression issues.    Today, [name] reported that her anxious mood has been slightly better and that she has been using the techniques, particularly deep breathing, that she learned during her last session.  She also commented that she believes that use of Xanax has been helpful in reducing anxiety.  Still, [name] noted continued nervousness, particularly surrounding standing up.  She said that when she is able to accomplish physical tasks that she did not believe she was capable of doing, she feels happy, but still worries about whether she would be able to do it again in the future.  [name] reported that she is continuing to try and take small steps toward her larger goal, but still feels nervous about certain tasks.  She again mentioned that she worries about how her size may adversely impact her therapists' abilities to keep her safe.  She was encouraged to share those fears with her therapists so that they might have an opportunity to provide her with reassurance and connect on a deeper level to develop more trust.  She was agreeable and time was spent in role-playing those conversations.    Regarding depression, [name] endorsed fewer symptoms of depression currently and cited experiencing less pain and being in more frequent contact with her family as the catalysts for reducing depressed  mood. She mentioned that she occasionally feels low, but those feelings last no longer than 1 minute in duration.  She typically uses prayer or distraction in order to cope with depressive feelings when she experiences them.  [name]'s depressed mood seems to be better now, but continued follow-up with the neuropsychologist could be requested for management of anxiety.    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   Marlane Hatcher, PsyD Clinical Neuropsychologist

## 2015-07-07 NOTE — Consult Note (Addendum)
WOC consult requested for necrotic right 5th fingertip.  This area of eschar was present when the patient was previously in ICU when she was critically ill.  This is beyond [facility] scope of practice.Topical treatment will not be effective to promote healing for eschar.  Please consult hand surgery team or plastic surgeon for further input.  [facility] team will continue to follow for ostomy and abd wound, but not for finger.  Thank-you,  Julien Girt MSN, [place], [place], [place], [place]

## 2015-07-08 ENCOUNTER — Inpatient Hospital Stay (HOSPITAL_COMMUNITY): Payer: Medicaid Other | Admitting: Physical Therapy

## 2015-07-08 LAB — GLUCOSE, CAPILLARY
GLUCOSE-CAPILLARY: 76 mg/dL (ref 65–99)
GLUCOSE-CAPILLARY: 97 mg/dL (ref 65–99)
GLUCOSE-CAPILLARY: 106 mg/dL — AB (ref 65–99)
Glucose-Capillary: 102 mg/dL — ABNORMAL HIGH (ref 65–99)

## 2015-07-08 MED ORDER — MESALAMINE ER 250 MG PO CPCR
500.0000 mg | ORAL_CAPSULE | Freq: Three times a day (TID) | ORAL | Status: DC
  Administered 2015-07-08 – 2015-07-15 (×18): 500 mg via ORAL
  Filled 2015-07-08 (×43): qty 2

## 2015-07-08 NOTE — Progress Notes (Signed)
Physical Therapy Session Note  Patient Details  Name: Dominique [name] MRN: [id number] Date of Birth: 1980-11-24  Today's Date: 07/08/2015 PT Individual Time: 1300-1415 PT Individual Time Calculation (min): 75 min   Short Term Goals: Week 2:  PT Short Term Goal 1 (Week 2): Pt will initiate and assist with rolling in bed with mod A of one person PT Short Term Goal 2 (Week 2): Pt will perform supine > sit with mod A of one person PT Short Term Goal 3 (Week 2): Pt will perform transfers bed <> w/c with slideboard and max A of one person PT Short Term Goal 4 (Week 2): Pt will perform w/c mobility in manual w/c x 50' with mod A  PT Short Term Goal 5 (Week 2): Pt will tolerate standing at 80-90 deg with use of tilt table x 10 minutes while performing LE exercises and WB  Skilled Therapeutic Interventions/Progress Updates:    Pt received in bed - c/o sternum pain. PT notes vitals are all normal for this patient and pt reports she gets this pain whenever she eats ever since she's been in the hospital for this admission. Pt ate beef for dinner last night, which is one of the first solid foods she's eaten in a while. Therapeutic Exercise - PT instructs pt in B LE rom and strengthening exercises with bed in max (20 degrees) reverse [place] position: mini-squats with as much knee flexion as pt able to do: 3 x 10 reps, heel raises 2 x 30 reps, single leg marching 2 x 30 reps, hip IR in B hook lie with PT and Tech stabilizing feet 2 x 15 reps, bridging/scooting up in bed with PT and tech stabilizing feet x 30 reps (pt uses aarom with arm on bedrails/headboard as needed and when within reach), assisted side lie hip ER/clam shells x 30 reps each side. Therapeutic Activity - Pt req 2 person for scooting in supine and scooting laterally for stabilizing feet and cues for technique. Pt req one-two person assist to roll in bed R and L with bedrail, sheet rope, pre-placement of foot to push, assist at breast, and hand  hold assist.  Pt uses bed controls to maximally elevate HOB, then bedrail and HHA to achieve sit eob with +2 assist (min A x 2). Pt attempts sit to stand with L hand on bedrail and R hand on back bar of shuttle chair locked into place x 4 attempts - unable to fully clear bed (pillow placed behind R leg due to discomfort of bed frame). Pt reports feeling as if she is sliding forward (air mattress maintained inflated for pt comfort), so mattress deflated and pt assisted sit to supine with +2 assist, and scoot up in bed, with PT stabilizing feet and pt assisting with arms on bedrails/headboard. Pt ended with all needs in reach, reporting it was a good workout, with 1 L O2/min via  as she plans on taking a nap. Continue per PT pOC.   Therapy Documentation Precautions:  Precautions Precautions: Fall Precaution Comments: abdominal wound vac, colostomy Required Braces or Orthoses: Other Brace/Splint Other Brace/Splint: abdominal binder when OOB Restrictions Weight Bearing Restrictions: No Vital Signs: Therapy Vitals Pulse Rate: 99 BP: (!) 104/54 mmHg Patient Position (if appropriate): Lying Oxygen Therapy SpO2: 97 % O2 Device: Nasal Cannula O2 Flow Rate (L/min): 1 L/min Pain: Pain Assessment Pain Assessment: 0-10 Pain Score: 7  Pain Type: Acute pain Pain Location: Sternum Pain Orientation: Mid Pain Descriptors / Indicators: Pressure Pain Onset:  On-going Pain Intervention(s): Rest;Repositioned Multiple Pain Sites: No   See Function Navigator for Current Functional Status.   Therapy/Group: Individual Therapy with [facility], PT TEch as +2  Kyrus Hyde M 07/08/2015, 1:12 PM

## 2015-07-08 NOTE — Progress Notes (Signed)
Subjective/Complaints: Poor appetite Pt asked if she was on prednisone Has midline abd pain ROS:  No CP or SOB, no increased limb swelling Objective: Vital Signs: Blood pressure 132/58, pulse 101, temperature 98.2 F (36.8 C), temperature source Oral, resp. rate 16, SpO2 95 %. No results found. Results for orders placed or performed during the hospital encounter of 06/26/15 (from the past 72 hour(s))  Glucose, capillary     Status: None   Collection Time: 07/05/15 11:27 AM  Result Value Ref Range   Glucose-Capillary 85 65 - 99 mg/dL  Glucose, capillary     Status: Abnormal   Collection Time: 07/05/15  4:13 PM  Result Value Ref Range   Glucose-Capillary 114 (H) 65 - 99 mg/dL  Glucose, capillary     Status: Abnormal   Collection Time: 07/05/15  8:28 PM  Result Value Ref Range   Glucose-Capillary 128 (H) 65 - 99 mg/dL  Glucose, capillary     Status: None   Collection Time: 07/06/15  6:51 AM  Result Value Ref Range   Glucose-Capillary 90 65 - 99 mg/dL  Glucose, capillary     Status: None   Collection Time: 07/06/15 11:15 AM  Result Value Ref Range   Glucose-Capillary 88 65 - 99 mg/dL  Glucose, capillary     Status: Abnormal   Collection Time: 07/06/15  4:13 PM  Result Value Ref Range   Glucose-Capillary 100 (H) 65 - 99 mg/dL  Glucose, capillary     Status: None   Collection Time: 07/06/15  9:22 PM  Result Value Ref Range   Glucose-Capillary 95 65 - 99 mg/dL  Glucose, capillary     Status: None   Collection Time: 07/07/15  6:44 AM  Result Value Ref Range   Glucose-Capillary 96 65 - 99 mg/dL  CBC     Status: Abnormal   Collection Time: 07/07/15  9:55 AM  Result Value Ref Range   WBC 7.7 4.0 - 10.5 K/uL   RBC 3.26 (L) 3.87 - 5.11 MIL/uL   Hemoglobin 8.1 (L) 12.0 - 15.0 g/dL   HCT 27.5 (L) 36.0 - 46.0 %   MCV 84.4 78.0 - 100.0 fL   MCH 24.8 (L) 26.0 - 34.0 pg   MCHC 29.5 (L) 30.0 - 36.0 g/dL   RDW 24.5 (H) 11.5 - 15.5 %   Platelets 351 150 - 400 K/uL  Glucose,  capillary     Status: None   Collection Time: 07/07/15 11:43 AM  Result Value Ref Range   Glucose-Capillary 93 65 - 99 mg/dL  Glucose, capillary     Status: Abnormal   Collection Time: 07/07/15  4:29 PM  Result Value Ref Range   Glucose-Capillary 103 (H) 65 - 99 mg/dL  Glucose, capillary     Status: None   Collection Time: 07/07/15  8:59 PM  Result Value Ref Range   Glucose-Capillary 98 65 - 99 mg/dL  Glucose, capillary     Status: None   Collection Time: 07/08/15  6:44 AM  Result Value Ref Range   Glucose-Capillary 76 65 - 99 mg/dL     HEENT: light sensitive Right eye Cardio: RRR and no murmur Resp: CTA B/L and unlabored GI: BS positive and LLQ colostomy, midline open abd wound with Wet to Dry ,  Extremity:  No Edema Skin:   Wound see above, no heel ulceration Neuro: Alert/Oriented and Abnormal Motor 5/5 in BUE, 3- bilateral HF,able to lift heel off bed ~3 inches when supine 4- Knee ext and ankle  DF/PF Musc/Skel:  Other no pain with UE and LE ROM Gen NAD   Assessment/Plan: 1. Functional deficits secondary to debilitation/Crohn's disease/perforated left colon with peritonitis/exploratory laparotomy with drainage of intra-abdominal abscess partial colon resection with colostomy which require 3+ hours per day of interdisciplinary therapy in a comprehensive inpatient rehab setting. Physiatrist is providing close team supervision and 24 hour management of active medical problems listed below. Physiatrist and rehab team continue to assess barriers to discharge/monitor patient progress toward functional and medical goals.   FIM: Function - Bathing Position: Bed Body parts bathed by patient: Right arm, Left arm, Chest Body parts bathed by helper: Abdomen, Front perineal area, Buttocks, Right upper leg, Left upper leg Bathing not applicable: Abdomen, Buttocks, Front perineal area (peri care done prior to OT session) Assist Level: Touching or steadying assistance(Pt >  75%)  Function- Upper Body Dressing/Undressing What is the patient wearing?: Hospital gown Bra - Perfomed by patient: Thread/unthread right bra strap, Thread/unthread left bra strap Bra - Perfomed by helper: Hook/unhook bra (pull down sports bra) Pull over shirt/dress - Perfomed by patient: Thread/unthread right sleeve, Thread/unthread left sleeve Pull over shirt/dress - Perfomed by helper: Pull shirt over trunk Assist Level: Touching or steadying assistance(Pt > 75%) Set up : To obtain clothing/put away Function - Lower Body Dressing/Undressing What is the patient wearing?: Hospital Gown, Non-skid slipper socks Position: Bed Pants- Performed by patient: Pull pants up/down Pants- Performed by helper: Thread/unthread right pants leg, Thread/unthread left pants leg, Pull pants up/down Non-skid slipper socks- Performed by patient: Don/doff right sock, Don/doff left sock Non-skid slipper socks- Performed by helper: Don/doff right sock, Don/doff left sock Assist for lower body dressing: Touching or steadying assistance (Pt > 75%)  Function - Toileting Toileting activity did not occur: Safety/medical concerns Toileting steps completed by patient: Adjust clothing prior to toileting, Performs perineal hygiene, Adjust clothing after toileting Toileting steps completed by helper: Adjust clothing prior to toileting, Performs perineal hygiene, Adjust clothing after toileting Assist level: Touching or steadying assistance (Pt.75%)  Function - Air cabin crew transfer activity did not occur: Safety/medical concerns Toilet transfer assistive device: Mechanical lift Mechanical lift: Maximove Assist level to toilet: 2 helpers Assist level from toilet: 2 helpers  Function - Chair/bed transfer Chair/bed transfer activity did not occur: Safety/medical concerns Chair/bed transfer method: Other Chair/bed transfer assist level: 2 helpers Chair/bed transfer assistive device: Mechanical  lift Mechanical lift: Maximove Chair/bed transfer details: Verbal cues for safe use of DME/AE, Manual facilitation for placement  Function - Locomotion: Wheelchair Will patient use wheelchair at discharge?: Yes Type: Manual Wheelchair activity did not occur: Safety/medical concerns (debility and awaiting appropriate equipment) Max wheelchair distance: 47' Assist Level: Touching or steadying assistance (Pt > 75%) Assist Level: Dependent (Pt equals 0%) Assist Level: Dependent (Pt equals 0%) Turns around,maneuvers to table,bed, and toilet,negotiates 3% grade,maneuvers on rugs and over doorsills: No Function - Locomotion: Ambulation Ambulation activity did not occur: Safety/medical concerns (significant debility)  Function - Comprehension Comprehension: Auditory Comprehension assist level: Follows complex conversation/direction with no assist  Function - Expression Expression: Verbal Expression assist level: Expresses complex ideas: With no assist  Function - Social Interaction Social Interaction assist level: Interacts appropriately with others with medication or extra time (anti-anxiety, antidepressant).  Function - Problem Solving Problem solving assist level: Solves complex problems: With extra time  Function - Memory Memory assist level: Complete Independence: No helper Patient normally able to recall (first 3 days only): Current season, Location of own room, Staff names and faces,  That he or she is in a hospital   Medical Problem List and Plan: 1. Functional deficits secondary to debilitation/Crohn's disease/perforated left colon with peritonitis/exploratory laparotomy with drainage of intra-abdominal abscess partial colon resection with colostomy. Continue Pentasa 500 mg 4 times a day 2.  DVT Prophylaxis/Anticoagulation: Subcutaneous heparin. Monitor platelet counts, currently noi signs of bleeding 3. Pain Management: Hydrocodone as needed 4. Acute blood loss anemia. Monitor  while on heparin, hgb 8.0 on 10/10 5. Neuropsych: This patient is capable of making decisions on her own behalf. 6. 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 Fluids/Electrolytes/Nutrition: Strict I&O with follow-up chemistries 8. Acute renal insufficiency 2.64-2.84. Creatinine latest result 1.09. Follow-up chemistries stable, ? If lower dreat is related to loss of muscle mass 9. Acute respiratory failure/VDRF. Extubated 06/14/2015.propable obesity hypoventilation syndrome, cont PRN O2 10. SVT. Monitor heart rate. Lopressor 50 mg twice a day, pt also severely deconditioned with poor exercise tolerance, am HR 101, cont current dose 11. Chronic systolic congestive heart failure. Monitor for any signs of fluid overload. Lasix 40 mg twice a day, monitor intake and creat, no SOB, sats ok, no change in dose at present 12.  UTI ecoli- S to nitro, cont abx x 7d, afebrile 13. Morbid obesity. Body mass index 67.33 14.  Mild hypokalemia - will supplement  15.  Severe hypoalbuminemia- supplement , this is contributing to delayed wound healing , doesn't tolerate benprotein but drinks ensure 15.  Dry ganagrene R 5th digit due to microembolism , monitor for demarcation no surgical intervention needed at this poin per [facility]- appreciate consult  LOS (Days) 12 A FACE TO FACE EVALUATION WAS PERFORMED  KIRSTEINS,ANDREW E 07/08/2015, 9:57 AM

## 2015-07-09 ENCOUNTER — Inpatient Hospital Stay (HOSPITAL_COMMUNITY): Payer: Medicaid Other | Admitting: Occupational Therapy

## 2015-07-09 DIAGNOSIS — K50019 Crohn's disease of small intestine with unspecified complications: Secondary | ICD-10-CM

## 2015-07-09 LAB — GLUCOSE, CAPILLARY
GLUCOSE-CAPILLARY: 81 mg/dL (ref 65–99)
Glucose-Capillary: 96 mg/dL (ref 65–99)
Glucose-Capillary: 98 mg/dL (ref 65–99)
Glucose-Capillary: 115 mg/dL — ABNORMAL HIGH (ref 65–99)

## 2015-07-09 NOTE — Progress Notes (Signed)
At 2145, after taking meds, patient threw up approximately 100cc of yellow emesis with meds mixed in. Patient reports apple juice tasted funny. PRN zofran, given at 2155 with relief. Poor appetite.  No stool out of ostomy thus far this weekend. Burped ostomy bag this AM.  Foley with cloudy urine, sediment.  Wound VAC with 50cc out over past 12 hours. Inter dry under breast, skin tear to sternum area between breasts. 2 norco managing upper abdominal pain. Patrici Ranks A

## 2015-07-09 NOTE — Progress Notes (Signed)
Patient skin assessment done with occupational therapy today. Slit/abrasion noted under right breast. Interdry in use under breast fold. No breakdown noted on buttocks. New interdry ordered today. Wound vac in place.

## 2015-07-09 NOTE — Progress Notes (Signed)
Subjective/Complaints: Poor appetite Pt asked if she was on prednisone Has midline abd pain ROS:  No CP or SOB, no increased limb swelling Objective: Vital Signs: Blood pressure 122/58, pulse 98, temperature 98.1 F (36.7 C), temperature source Oral, resp. rate 18, SpO2 100 %. No results found. Results for orders placed or performed during the hospital encounter of 06/26/15 (from the past 72 hour(s))  Glucose, capillary     Status: None   Collection Time: 07/06/15 11:15 AM  Result Value Ref Range   Glucose-Capillary 88 65 - 99 mg/dL  Glucose, capillary     Status: Abnormal   Collection Time: 07/06/15  4:13 PM  Result Value Ref Range   Glucose-Capillary 100 (H) 65 - 99 mg/dL  Glucose, capillary     Status: None   Collection Time: 07/06/15  9:22 PM  Result Value Ref Range   Glucose-Capillary 95 65 - 99 mg/dL  Glucose, capillary     Status: None   Collection Time: 07/07/15  6:44 AM  Result Value Ref Range   Glucose-Capillary 96 65 - 99 mg/dL  CBC     Status: Abnormal   Collection Time: 07/07/15  9:55 AM  Result Value Ref Range   WBC 7.7 4.0 - 10.5 K/uL   RBC 3.26 (L) 3.87 - 5.11 MIL/uL   Hemoglobin 8.1 (L) 12.0 - 15.0 g/dL   HCT 27.5 (L) 36.0 - 46.0 %   MCV 84.4 78.0 - 100.0 fL   MCH 24.8 (L) 26.0 - 34.0 pg   MCHC 29.5 (L) 30.0 - 36.0 g/dL   RDW 24.5 (H) 11.5 - 15.5 %   Platelets 351 150 - 400 K/uL  Glucose, capillary     Status: None   Collection Time: 07/07/15 11:43 AM  Result Value Ref Range   Glucose-Capillary 93 65 - 99 mg/dL  Glucose, capillary     Status: Abnormal   Collection Time: 07/07/15  4:29 PM  Result Value Ref Range   Glucose-Capillary 103 (H) 65 - 99 mg/dL  Glucose, capillary     Status: None   Collection Time: 07/07/15  8:59 PM  Result Value Ref Range   Glucose-Capillary 98 65 - 99 mg/dL  Glucose, capillary     Status: None   Collection Time: 07/08/15  6:44 AM  Result Value Ref Range   Glucose-Capillary 76 65 - 99 mg/dL  Glucose, capillary      Status: None   Collection Time: 07/08/15 11:39 AM  Result Value Ref Range   Glucose-Capillary 97 65 - 99 mg/dL   Comment 1 Notify RN   Glucose, capillary     Status: Abnormal   Collection Time: 07/08/15  4:29 PM  Result Value Ref Range   Glucose-Capillary 102 (H) 65 - 99 mg/dL   Comment 1 Notify RN   Glucose, capillary     Status: Abnormal   Collection Time: 07/08/15  9:33 PM  Result Value Ref Range   Glucose-Capillary 106 (H) 65 - 99 mg/dL  Glucose, capillary     Status: None   Collection Time: 07/09/15  6:36 AM  Result Value Ref Range   Glucose-Capillary 81 65 - 99 mg/dL     HEENT: light sensitive Right eye Cardio: RRR and no murmur Resp: CTA B/L and unlabored GI: BS positive and LLQ colostomy, midline open abd wound with Wet to Dry ,  Extremity:  No Edema Skin:   Wound see above, no heel ulceration Neuro: Alert/Oriented and Abnormal Motor 5/5 in BUE, 3- bilateral HF,able  to lift heel off bed ~3 inches when supine 4- Knee ext and ankle DF/PF Musc/Skel:  Other no pain with UE and LE ROM Gen NAD   Assessment/Plan: 1. Functional deficits secondary to debilitation/Crohn's disease/perforated left colon with peritonitis/exploratory laparotomy with drainage of intra-abdominal abscess partial colon resection with colostomy which require 3+ hours per day of interdisciplinary therapy in a comprehensive inpatient rehab setting. Physiatrist is providing close team supervision and 24 hour management of active medical problems listed below. Physiatrist and rehab team continue to assess barriers to discharge/monitor patient progress toward functional and medical goals.   FIM: Function - Bathing Position: Bed Body parts bathed by patient: Right arm, Left arm, Chest Body parts bathed by helper: Abdomen, Front perineal area, Buttocks, Right upper leg, Left upper leg Bathing not applicable: Abdomen, Buttocks, Front perineal area (peri care done prior to OT session) Assist Level: Touching or  steadying assistance(Pt > 75%)  Function- Upper Body Dressing/Undressing What is the patient wearing?: Hospital gown Bra - Perfomed by patient: Thread/unthread right bra strap, Thread/unthread left bra strap Bra - Perfomed by helper: Hook/unhook bra (pull down sports bra) Pull over shirt/dress - Perfomed by patient: Thread/unthread right sleeve, Thread/unthread left sleeve Pull over shirt/dress - Perfomed by helper: Pull shirt over trunk Assist Level: Touching or steadying assistance(Pt > 75%) Set up : To obtain clothing/put away Function - Lower Body Dressing/Undressing What is the patient wearing?: Hospital Gown, Non-skid slipper socks Position: Bed Pants- Performed by patient: Pull pants up/down Pants- Performed by helper: Thread/unthread right pants leg, Thread/unthread left pants leg, Pull pants up/down Non-skid slipper socks- Performed by patient: Don/doff right sock, Don/doff left sock Non-skid slipper socks- Performed by helper: Don/doff right sock, Don/doff left sock Assist for lower body dressing: Touching or steadying assistance (Pt > 75%)  Function - Toileting Toileting activity did not occur: Safety/medical concerns Toileting steps completed by patient: Adjust clothing prior to toileting, Performs perineal hygiene, Adjust clothing after toileting Toileting steps completed by helper: Adjust clothing prior to toileting, Performs perineal hygiene, Adjust clothing after toileting Assist level: Touching or steadying assistance (Pt.75%)  Function - Air cabin crew transfer activity did not occur: Safety/medical concerns Toilet transfer assistive device: Mechanical lift Mechanical lift: Maximove Assist level to toilet: 2 helpers Assist level from toilet: 2 helpers  Function - Chair/bed transfer Chair/bed transfer activity did not occur: Safety/medical concerns Chair/bed transfer method: Other Chair/bed transfer assist level: 2 helpers Chair/bed transfer assistive  device: Mechanical lift Mechanical lift: Maximove Chair/bed transfer details: Verbal cues for safe use of DME/AE, Manual facilitation for placement  Function - Locomotion: Wheelchair Will patient use wheelchair at discharge?: Yes Type: Manual Wheelchair activity did not occur: Safety/medical concerns (debility and awaiting appropriate equipment) Max wheelchair distance: 30' Assist Level: Touching or steadying assistance (Pt > 75%) Assist Level: Dependent (Pt equals 0%) Assist Level: Dependent (Pt equals 0%) Turns around,maneuvers to table,bed, and toilet,negotiates 3% grade,maneuvers on rugs and over doorsills: No Function - Locomotion: Ambulation Ambulation activity did not occur: Safety/medical concerns (significant debility)  Function - Comprehension Comprehension: Auditory Comprehension assist level: Follows complex conversation/direction with no assist  Function - Expression Expression: Verbal Expression assist level: Expresses complex ideas: With no assist  Function - Social Interaction Social Interaction assist level: Interacts appropriately with others with medication or extra time (anti-anxiety, antidepressant)., Interacts appropriately with others - No medications needed.  Function - Problem Solving Problem solving assist level: Solves complex problems: Recognizes & self-corrects  Function - Memory Memory assist level: Complete  Independence: No helper Patient normally able to recall (first 3 days only): Current season, Location of own room, Staff names and faces, That he or she is in a hospital   Medical Problem List and Plan: 1. Functional deficits secondary to debilitation/Crohn's disease/perforated left colon with peritonitis/exploratory laparotomy with drainage of intra-abdominal abscess partial colon resection with colostomy. Restarted  Pentasa 500 mg 4 times a day on 10/15 was several days without it -nausea may be Crohn's related, better today, will monitor  intake as well as colostomy output 2.  DVT Prophylaxis/Anticoagulation: Subcutaneous heparin. Monitor platelet counts, currently no signs of bleeding 3. Pain Management: Hydrocodone as needed 4. Acute blood loss anemia. Monitor while on heparin, hgb 8.0 on 10/10 5. Neuropsych: This patient is capable of making decisions on her own behalf. 6. 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 Fluids/Electrolytes/Nutrition: Strict I&O with follow-up chemistries 8. Acute renal insufficiency 2.64-2.84. Creatinine latest result 1.09. Follow-up chemistries stable, ? If lower dreat is related to loss of muscle mass 9. Acute respiratory failure/VDRF. Extubated 06/14/2015.propable obesity hypoventilation syndrome, cont PRN O2 10. SVT. Monitor heart rate. Lopressor 50 mg twice a day, pt also severely deconditioned with poor exercise tolerance, am HR 101, cont current dose 11. Chronic systolic congestive heart failure. Monitor for any signs of fluid overload. Lasix 40 mg twice a day, monitor intake and creat, no SOB, sats ok, no change in dose at present 12.  UTI ecoli- S to nitro, to d/c today, afebrile 13. Morbid obesity. Body mass index 67.33 14.  Mild hypokalemia - will supplement  15.  Severe hypoalbuminemia- supplement , this is contributing to delayed wound healing , doesn't tolerate beneprotein but drinks ensure 15.  Dry ganagrene R 5th digit due to microembolism , monitor for demarcation no surgical intervention needed at this poin per [facility]- appreciate consult  LOS (Days) 13 A FACE TO FACE EVALUATION WAS PERFORMED  KIRSTEINS,ANDREW E 07/09/2015, 9:43 AM

## 2015-07-09 NOTE — Progress Notes (Signed)
Occupational Therapy Session Note  Patient Details  Name: Dominique [name] MRN: [id number] Date of Birth: 18-Feb-1981  Today's Date: 07/09/2015 OT Individual Time:  - 1100-1200  (49mn)      Short Term Goals: Week 1:  OT Short Term Goal 1 (Week 1): Pt will complete upper body bathing seated with Mod Assist OT Short Term Goal 1 - Progress (Week 1): Met OT Short Term Goal 2 (Week 1): Pt will complete transfer from EOB to w/c with Max Assist OT Short Term Goal 2 - Progress (Week 1): Progressing toward goal OT Short Term Goal 3 (Week 1): Pt will complete lower body bathing supine with Mod Assist OT Short Term Goal 3 - Progress (Week 1): Progressing toward goal OT Short Term Goal 4 (Week 1): Pt will complete toileting with Max Assist  OT Short Term Goal 4 - Progress (Week 1): Discontinued (comment) (d/t foley cath and ostomy) OT Short Term Goal 5 (Week 1): Pt will demo ability to complete HEP for BUE with supervision OT Short Term Goal 5 - Progress (Week 1): Met Week 2:  OT Short Term Goal 1 (Week 2): Pt will complete upper body dressing sitting unsupported at EOB with Min assist. OT Short Term Goal 2 (Week 2): Pt will complete grooming sititng at EOB unsupported with supervision and setup OT Short Term Goal 3 (Week 2): Pt will complete slide board transfer, w/c <> raised mat, with +2 assist only (no Maxi-Sky sling used) Week 3:     Skilled Therapeutic Interventions/Progress Updates:     Skilled Therapeutic Interventions/Progress Updates: ADL-retraining at bed level with focus on improved bed mobility during assisted BADL.Used LH sponge in supine for bathing legs.  Used reacher for drying legs.    Pt performed rolling for peri cleaning with max assist.  She  Provided new silver nitrate dressing for under breast.  PPt elected to stay in bed at end of session.  She pushed self to head of  Bed sith max assist.  Pt bend bothe knees with max assist to slide to head of bed.   Pt left in bed  at end of  session with all needs within reach.     Therapy Documentation Precautions:  Precautions Precautions: Fall Precaution Comments: abdominal wound vac, colostomy Required Braces or Orthoses: Other Brace/Splint Other Brace/Splint: abdominal binder when OOB Restrictions Weight Bearing Restrictions: No      Pain: Pain Assessment Pain Assessment: 0-10 Pain Score: 7  Pain Type: Acute pain Pain Location: Abdomen Pain Orientation: Right;Left;Upper Pain Descriptors / Indicators: Aching;Sore Pain Frequency: Intermittent Pain Onset: On-going Pain Intervention(s): Medication (See eMAR);Heat applied Multiple Pain Sites: No ADL: ADL ADL Comments: see Functional Assessment    :    See Function Navigator for Current Functional Status.   Therapy/Group: Individual Therapy  ELisa Roca10/16/2016, 7:58 AM

## 2015-07-10 ENCOUNTER — Inpatient Hospital Stay (HOSPITAL_COMMUNITY): Payer: Medicaid Other

## 2015-07-10 ENCOUNTER — Inpatient Hospital Stay (HOSPITAL_COMMUNITY): Payer: Medicaid Other | Admitting: Physical Therapy

## 2015-07-10 LAB — GLUCOSE, CAPILLARY
GLUCOSE-CAPILLARY: 99 mg/dL (ref 65–99)
Glucose-Capillary: 91 mg/dL (ref 65–99)
Glucose-Capillary: 96 mg/dL (ref 65–99)
Glucose-Capillary: 95 mg/dL (ref 65–99)

## 2015-07-10 NOTE — Progress Notes (Signed)
Pt c/o sharp shooting pain to abdomen. Pt also complaining of shivering and saying that she is cold. Pt's room temperature is very warm at the moment. Pt has no fever. Pt medicated for pain. PA notified of pt's condition. Will continue to monitor pt.

## 2015-07-10 NOTE — Progress Notes (Signed)
Ostomy bagged burped X 2 in past 12 hours. Small amount of liquid stool in bag. PRN vicodin given at 2125 & 0551. Skin tear to sternal area between breasts. Inter dry in place. No problems with N&V.  O2 at 1L/M at HS.Dominique [name] A

## 2015-07-10 NOTE — Progress Notes (Signed)
Social Work Patient ID: Dominique [name], female   DOB: 04/22/1981, 34 y.o.   MRN: [id number]   Late entry for visit on 07-07-15: Met with pt on 07-07-15 to update her on team conference discussion and then spoke with her mother via telephone.  Pt feels her mother can help her with care when she goes home, as she is a retired [employer] scientist.  CSW spoke with pt's mother who stated she can assist with medical care and this does not bother her.  She can stay to help pt when she comes home, although she too, is worried about pt climbing steps to enter apartment.  It is small inside apartment, but mother says they will make it work.  She is worried about pt's landlord giving them a hard time for her staying there and CSW offered to obtain a letter from the MD stating that pt needs care at home, if they need it.  Mother will let CSW know.  She also asked if pt could receive short-term disability and CSW explained that this does not exist through [employer], only Pharmacist, hospital, that the process take a while through the SS administration, but that we can apply.  Gave pt SSD application to work on and also SCAT and then CSW will help her to file them.  Also informed pt's mother, that we would need for her to come in for family education to make sure she can provide the care needed for pt and so that she and pt felt comfortable.  She agreed.  CSW took pt a book to read and some magazines to help pass the time when she is not in therapy.  Pt's dtr to turn 4 this week and she is disappointed about being in the hospital on her birthday.  CSW will continue to follow and assist as needed.

## 2015-07-10 NOTE — Plan of Care (Signed)
Problem: RH SKIN INTEGRITY Goal: RH STG SKIN FREE OF INFECTION/BREAKDOWN Free of skin breakdown and infection mod assist.  Outcome: Not Progressing Patient is Total care. Goal: RH STG MAINTAIN SKIN INTEGRITY WITH ASSISTANCE STG Maintain Skin Integrity With max Assistance.  Outcome: Not Progressing Total care

## 2015-07-10 NOTE — Progress Notes (Signed)
Physical Therapy Session Note  Patient Details  Name: Dominique [name] MRN: [id number] Date of Birth: 20-May-1981  Today's Date: 07/10/2015 PT Individual Time: 1400-1430 PT Individual Time Calculation (min): 30 min   Short Term Goals: Week 2:  PT Short Term Goal 1 (Week 2): Pt will initiate and assist with rolling in bed with mod A of one person PT Short Term Goal 2 (Week 2): Pt will perform supine > sit with mod A of one person PT Short Term Goal 3 (Week 2): Pt will perform transfers bed <> w/c with slideboard and max A of one person PT Short Term Goal 4 (Week 2): Pt will perform w/c mobility in manual w/c x 50' with mod A  PT Short Term Goal 5 (Week 2): Pt will tolerate standing at 80-90 deg with use of tilt table x 10 minutes while performing LE exercises and WB  Skilled Therapeutic Interventions/Progress Updates:   Pt received supine in bed, c/o 9/10 stomach pain as described below as well as stating she feels nauseous and very cold despite several blankets on pt. Vitals assessed as below. RN alerted to pt pain and RN reports pt has been medicated and will alert PA to pt status.  Pt declines out of bed activity at this time due to stomach pain, however agreeable to light exercise in bed. Performed heel slides, ankle pumps, hip adduction isometrics with stability ball; all for 2 sets 20 reps with rest breaks in between due to fatigue. Educated pt on importance of mobility on GI tract, as well as for strengthening and improving mobility to prepare for d/c.  Pt continues to decline activity at this time and states she is in too much pain to continue; remained supine in bed at completion of session, all needs within reach. Missed 60 minutes due to pain/nausea.  Therapy Documentation Precautions:  Precautions Precautions: Fall Precaution Comments: abdominal wound vac, colostomy Required Braces or Orthoses: Other Brace/Splint Other Brace/Splint: abdominal binder when OOB Restrictions Weight  Bearing Restrictions: No General: PT Amount of Missed Time (min): 60 Minutes PT Missed Treatment Reason: Pain;Patient fatigue;Patient unwilling to participate (stomach pain) Vital Signs: Pulse Rate: 99 BP: 99/66 mmHg Patient Position (if appropriate): Lying  Pain: Pain Assessment Pain Assessment: 0-10 Pain Score: 9  Faces Pain Scale: Hurts little more Pain Type: Acute pain Pain Location: Abdomen Pain Orientation: Mid Pain Descriptors / Indicators: Shooting;Sharp Pain Onset: Gradual Patients Stated Pain Goal: 4 Pain Intervention(s): RN made aware Multiple Pain Sites: No   See Function Navigator for Current Functional Status.   Therapy/Group: Individual Therapy  Luberta Mutter 07/10/2015, 2:57 PM

## 2015-07-10 NOTE — Progress Notes (Signed)
Occupational Therapy Session Note  Patient Details  Name: Dominique [name] MRN: [id number] Date of Birth: December 21, 1980  Today's Date: 07/10/2015 OT Individual Time: 1340-1350 OT Individual Time Calculation (min): 10 min    Short Term Goals: Week 2:  OT Short Term Goal 1 (Week 2): Pt will complete upper body dressing sitting unsupported at EOB with Min assist. OT Short Term Goal 2 (Week 2): Pt will complete grooming sititng at EOB unsupported with supervision and setup OT Short Term Goal 3 (Week 2): Pt will complete slide board transfer, w/c <> raised mat, with +2 assist only (no Maxi-Sky sling used)  Skilled Therapeutic Interventions/Progress Updates:  OT attempting to see pt for make up time this session. Pt supine in bed upon entering the room with c/o feeling unwell. Pt was able to discuss her progress towards therapy goals and show insight of deficits. OT encouraged pt to make small goals each day to word towards long term OT goals. OT also reviewed those goals with pt this session with pt verbalizing understanding. Pt declining further OT intervention this session secondary to feeling unwell. Call bell and all needed items within reach upon exiting the room.   Therapy Documentation Precautions:  Precautions Precautions: Fall Precaution Comments: abdominal wound vac, colostomy Required Braces or Orthoses: Other Brace/Splint Other Brace/Splint: abdominal binder when OOB Restrictions Weight Bearing Restrictions: No General: General OT Amount of Missed Time: 30 Minutes ADL: ADL ADL Comments: see Functional Assessment  See Function Navigator for Current Functional Status.   Therapy/Group: Individual Therapy  [name] 07/10/2015, 2:06 PM

## 2015-07-10 NOTE — Progress Notes (Signed)
Subjective/Complaints: Appreciate RN note, no sacral breakdown despite laying in supine position ROS:  No CP or SOB, no increased limb swelling Objective: Vital Signs: Blood pressure 113/56, pulse 94, temperature 97.7 F (36.5 C), temperature source Oral, resp. rate 18, SpO2 100 %. No results found. Results for orders placed or performed during the hospital encounter of 06/26/15 (from the past 72 hour(s))  CBC     Status: Abnormal   Collection Time: 07/07/15  9:55 AM  Result Value Ref Range   WBC 7.7 4.0 - 10.5 K/uL   RBC 3.26 (L) 3.87 - 5.11 MIL/uL   Hemoglobin 8.1 (L) 12.0 - 15.0 g/dL   HCT 27.5 (L) 36.0 - 46.0 %   MCV 84.4 78.0 - 100.0 fL   MCH 24.8 (L) 26.0 - 34.0 pg   MCHC 29.5 (L) 30.0 - 36.0 g/dL   RDW 24.5 (H) 11.5 - 15.5 %   Platelets 351 150 - 400 K/uL  Glucose, capillary     Status: None   Collection Time: 07/07/15 11:43 AM  Result Value Ref Range   Glucose-Capillary 93 65 - 99 mg/dL  Glucose, capillary     Status: Abnormal   Collection Time: 07/07/15  4:29 PM  Result Value Ref Range   Glucose-Capillary 103 (H) 65 - 99 mg/dL  Glucose, capillary     Status: None   Collection Time: 07/07/15  8:59 PM  Result Value Ref Range   Glucose-Capillary 98 65 - 99 mg/dL  Glucose, capillary     Status: None   Collection Time: 07/08/15  6:44 AM  Result Value Ref Range   Glucose-Capillary 76 65 - 99 mg/dL  Glucose, capillary     Status: None   Collection Time: 07/08/15 11:39 AM  Result Value Ref Range   Glucose-Capillary 97 65 - 99 mg/dL   Comment 1 Notify RN   Glucose, capillary     Status: Abnormal   Collection Time: 07/08/15  4:29 PM  Result Value Ref Range   Glucose-Capillary 102 (H) 65 - 99 mg/dL   Comment 1 Notify RN   Glucose, capillary     Status: Abnormal   Collection Time: 07/08/15  9:33 PM  Result Value Ref Range   Glucose-Capillary 106 (H) 65 - 99 mg/dL  Glucose, capillary     Status: None   Collection Time: 07/09/15  6:36 AM  Result Value Ref Range    Glucose-Capillary 81 65 - 99 mg/dL  Glucose, capillary     Status: None   Collection Time: 07/09/15 12:12 PM  Result Value Ref Range   Glucose-Capillary 96 65 - 99 mg/dL   Comment 1 Document in Chart   Glucose, capillary     Status: None   Collection Time: 07/09/15  4:45 PM  Result Value Ref Range   Glucose-Capillary 98 65 - 99 mg/dL  Glucose, capillary     Status: Abnormal   Collection Time: 07/09/15  9:10 PM  Result Value Ref Range   Glucose-Capillary 115 (H) 65 - 99 mg/dL  Glucose, capillary     Status: None   Collection Time: 07/10/15  6:45 AM  Result Value Ref Range   Glucose-Capillary 91 65 - 99 mg/dL     HEENT: light sensitive Right eye Cardio: RRR and no murmur Resp: CTA B/L and unlabored GI: BS positive and LLQ colostomy, midline open abd wound with Wet to Dry ,  Extremity:  No Edema Skin:   Wound see above, no heel ulceration Neuro: Alert/Oriented and Abnormal Motor  5/5 in BUE, 3- bilateral HF,able to lift heel off bed ~3 inches when supine 4- Knee ext and ankle DF/PF Musc/Skel:  Other no pain with UE and LE ROM Gen NAD   Assessment/Plan: 1. Functional deficits secondary to debilitation/Crohn's disease/perforated left colon with peritonitis/exploratory laparotomy with drainage of intra-abdominal abscess partial colon resection with colostomy which require 3+ hours per day of interdisciplinary therapy in a comprehensive inpatient rehab setting. Physiatrist is providing close team supervision and 24 hour management of active medical problems listed below. Physiatrist and rehab team continue to assess barriers to discharge/monitor patient progress toward functional and medical goals.   FIM: Function - Bathing Position: Bed Body parts bathed by patient: Right arm, Left arm, Chest Body parts bathed by helper: Abdomen, Front perineal area, Buttocks, Right upper leg, Left upper leg Bathing not applicable: Abdomen, Buttocks, Front perineal area (peri care done prior to OT  session) Assist Level: Touching or steadying assistance(Pt > 75%)  Function- Upper Body Dressing/Undressing What is the patient wearing?: Hospital gown Bra - Perfomed by patient: Thread/unthread right bra strap, Thread/unthread left bra strap Bra - Perfomed by helper: Hook/unhook bra (pull down sports bra) Pull over shirt/dress - Perfomed by patient: Thread/unthread right sleeve, Thread/unthread left sleeve Pull over shirt/dress - Perfomed by helper: Pull shirt over trunk Assist Level: Touching or steadying assistance(Pt > 75%) Set up : To obtain clothing/put away Function - Lower Body Dressing/Undressing What is the patient wearing?: Hospital Gown, Non-skid slipper socks Position: Bed Pants- Performed by patient: Pull pants up/down Pants- Performed by helper: Thread/unthread right pants leg, Thread/unthread left pants leg, Pull pants up/down Non-skid slipper socks- Performed by patient: Don/doff right sock, Don/doff left sock Non-skid slipper socks- Performed by helper: Don/doff right sock, Don/doff left sock Assist for lower body dressing: Touching or steadying assistance (Pt > 75%)  Function - Toileting Toileting activity did not occur: Safety/medical concerns Toileting steps completed by patient: Adjust clothing prior to toileting, Performs perineal hygiene, Adjust clothing after toileting Toileting steps completed by helper: Adjust clothing prior to toileting, Performs perineal hygiene, Adjust clothing after toileting Assist level: Touching or steadying assistance (Pt.75%)  Function - Air cabin crew transfer activity did not occur: Safety/medical concerns Toilet transfer assistive device: Mechanical lift Mechanical lift: Maximove Assist level to toilet: 2 helpers Assist level from toilet: 2 helpers  Function - Chair/bed transfer Chair/bed transfer activity did not occur: Safety/medical concerns Chair/bed transfer method: Other Chair/bed transfer assist level: 2  helpers Chair/bed transfer assistive device: Mechanical lift Mechanical lift: Maximove Chair/bed transfer details: Verbal cues for safe use of DME/AE, Manual facilitation for placement  Function - Locomotion: Wheelchair Will patient use wheelchair at discharge?: Yes Type: Manual Wheelchair activity did not occur: Safety/medical concerns (debility and awaiting appropriate equipment) Max wheelchair distance: 7' Assist Level: Touching or steadying assistance (Pt > 75%) Assist Level: Dependent (Pt equals 0%) Assist Level: Dependent (Pt equals 0%) Turns around,maneuvers to table,bed, and toilet,negotiates 3% grade,maneuvers on rugs and over doorsills: No Function - Locomotion: Ambulation Ambulation activity did not occur: Safety/medical concerns (significant debility)  Function - Comprehension Comprehension: Auditory Comprehension assist level: Follows complex conversation/direction with no assist  Function - Expression Expression: Verbal Expression assist level: Expresses complex ideas: With no assist  Function - Social Interaction Social Interaction assist level: Interacts appropriately with others - No medications needed.  Function - Problem Solving Problem solving assist level: Solves complex problems: Recognizes & self-corrects  Function - Memory Memory assist level: Complete Independence: No helper Patient normally  able to recall (first 3 days only): Current season, Location of own room, Staff names and faces, That he or she is in a hospital   Medical Problem List and Plan: 1. Functional deficits secondary to debilitation/Crohn's disease/perforated left colon with peritonitis/exploratory laparotomy with drainage of intra-abdominal abscess partial colon resection with colostomy. Restarted  Pentasa 500 mg 4 times a day on 10/15 was several days without it -nausea may be Crohn's related, better today, will monitor intake as well as colostomy output 2.  DVT  Prophylaxis/Anticoagulation: Subcutaneous heparin. Monitor platelet counts, currently no signs of bleeding 3. Pain Management: Hydrocodone as needed 4. Acute blood loss anemia. Monitor while on heparin, hgb 8.0 on 10/10 5. Neuropsych: This patient is capable of making decisions on her own behalf. 6. 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 Fluids/Electrolytes/Nutrition: Strict I&O with follow-up chemistries 8. Acute renal insufficiency 2.64-2.84. Creatinine latest result 1.09. Follow-up chemistries stable, ? If lower dreat is related to loss of muscle mass 9. Acute respiratory failure/VDRF. Extubated 06/14/2015.propable obesity hypoventilation syndrome, cont PRN O2 10. SVT. Monitor heart rate. Lopressor 50 mg twice a day, pt also severely deconditioned with poor exercise tolerance, am HR 94, cont current dose 11. Chronic systolic congestive heart failure. Monitor for any signs of fluid overload. Lasix 40 mg twice a day, monitor intake and creat, no SOB, sats ok, no change in dose at present 12.  UTI ecoli- s/p treatment recheck UA 13. Morbid obesity. Body mass index 67.33 14.  Mild hypokalemia - will supplement  15.  Severe hypoalbuminemia- supplement , this is contributing to delayed wound healing , doesn't tolerate beneprotein but drinks ensure 15.  Dry ganagrene R 5th digit due to microembolism , monitor for demarcation no surgical intervention needed at this poin per [facility]- appreciate consult  LOS (Days) 14 A FACE TO FACE EVALUATION WAS PERFORMED  KIRSTEINS,ANDREW E 07/10/2015, 7:54 AM

## 2015-07-10 NOTE — Progress Notes (Signed)
Occupational Therapy Session Note  Patient Details  Name: Dominique [name] MRN: [id number] Date of Birth: 1981-09-01  Today's Date: 07/10/2015 OT Individual Time: 0930-1100 OT Individual Time Calculation (min): 90 min    Short Term Goals: Week 2:  OT Short Term Goal 1 (Week 2): Pt will complete upper body dressing sitting unsupported at EOB with Min assist. OT Short Term Goal 2 (Week 2): Pt will complete grooming sititng at EOB unsupported with supervision and setup OT Short Term Goal 3 (Week 2): Pt will complete slide board transfer, w/c <> raised mat, with +2 assist only (no Maxi-Sky sling used)  Skilled Therapeutic Interventions/Progress Updates:  ADL-retraining (60 min) at bed level and edge of bed with emphasis on improved bed mobility, re-ed on bathing/reaching abdomin and periarea with HOB elevated, static/dynamic sitting balance.  Pt noted with improved active movement and placement of both legs.  OT re--educated pt on circle-sitting and adapted bathing of lower body while in bed.   Pt able to reach superior and lateral border of peri-area this date to wash herself.   Pt required +2 helper with bed mobility to roll to her left but sustained static/dynamic sitting balance at EOB for 10 min to finish bathing and to don new gown.   Therapeutic activity (30 min) with focus on weight-shifting, attempts at sit<>stand with +2 assist, and activity tolerance.   With bed height adjusted, pt attempts sit<>stand 3 times to limit of endurance with 3 rest breaks.   HR increased to 135 bpm and pt desaturated to 86% although recovered each time in approx 3 minutes.   Pt was unable to complete stand even with max assist X 2 and was place in w/c using Max-Move at end of session to groom at sink.   Pt left in w/c at bedside at end of session with all needs within reach and RN tech present to assist.    Therapy Documentation Precautions:  Precautions Precautions: Fall Precaution Comments: abdominal wound vac,  colostomy Required Braces or Orthoses: Other Brace/Splint Other Brace/Splint: abdominal binder when OOB Restrictions Weight Bearing Restrictions: No  Pain:     ADL: ADL ADL Comments: see Functional Assessment   See Function Navigator for Current Functional Status.   Therapy/Group: Individual Therapy   Second session: Time: 1300-1300 Time Calculation (min): 0 min  Missed Time: 30 min d/t pain  Pain Assessment: 8/10, stomach  Skilled Therapeutic Interventions: Pt received supine in bed c/o pain, fatigue and no appetite (lunch tray untouched).   Pt recurrent episode of severe pain, every 2nd or 3rd day, as disabling.  Pt also reports poor thermoregulation and requested heated pain (K-pad) and additional blankets.   OT alerted RN and PA to pt's status.   See FIM for current functional status  Therapy/Group: Individual Therapy  Sidney 07/10/2015, 12:16 PM

## 2015-07-10 NOTE — Progress Notes (Signed)
Patient ID: Dominique [name], female   DOB: 1981/05/18, 34 y.o.   MRN: [id number]    Subjective: Pt feels ok.  No new c/o  Objective: Vital signs in last 24 hours: Temp:  [97.7 F (36.5 C)-98.9 F (37.2 C)] 97.7 F (36.5 C) (10/17 0501) Pulse Rate:  [94-101] 94 (10/17 0501) Resp:  [18] 18 (10/17 0501) BP: (102-123)/(56-64) 113/56 mmHg (10/17 0501) SpO2:  [94 %-100 %] 100 % (10/17 0501) Last BM Date: 07/09/15  Intake/Output from previous day: 10/16 0701 - 10/17 0700 In: 480 [P.O.:480] Out: 2300 [Urine:2250; Drains:50] Intake/Output this shift:    PE: Abd: soft, stable,  Wound is clean and VAC replaced.  Ostomy appliance removed and remains stable as well.  Floating, except attached on lateral side.  Still with some purulent drainage around the floating part on the medial side, but this does not connect to her wound.  Lab Results:   Recent Labs  07/07/15 0955  WBC 7.7  HGB 8.1*  HCT 27.5*  PLT 351   BMET No results for input(s): NA, K, CL, CO2, GLUCOSE, BUN, CREATININE, CALCIUM in the last 72 hours. PT/INR No results for input(s): LABPROT, INR in the last 72 hours. CMP     Component Value Date/Time   NA 138 06/27/2015 0543   K 3.4* 06/27/2015 0543   CL 104 06/27/2015 0543   CO2 29 06/27/2015 0543   GLUCOSE 91 06/27/2015 0543   BUN 21* 06/27/2015 0543   CREATININE 1.07* 06/27/2015 0543   CREATININE 0.52 12/29/2014 1557   CALCIUM 7.6* 06/27/2015 0543   PROT 4.3* 06/27/2015 0543   ALBUMIN 1.4* 06/27/2015 0543   AST 28 06/27/2015 0543   ALT 20 06/27/2015 0543   ALKPHOS 102 06/27/2015 0543   BILITOT 0.6 06/27/2015 0543   GFRNONAA >60 06/27/2015 0543   GFRAA >60 06/27/2015 0543   Lipase     Component Value Date/Time   LIPASE 10* 06/09/2015 1325       Studies/Results: No results found.  Anti-infectives: Anti-infectives    Start     Dose/Rate Route Frequency Ordered Stop   06/30/15 2000  ciprofloxacin (CIPRO) tablet 250 mg  Status:  Discontinued     250 mg  Oral 2 times daily 06/30/15 1716 07/02/15 1418       Assessment/Plan  Perforated descending colon, colitis POD 27,29 s/p ex lap with DRAINAGE OF INTRA-ABDOMINAL ABSCESS, PARTIAL COLON RESECTION, re-exploration and creation of colostomy and abdominal closure---Dr. Lanier Clam - wound is clean. Cont wound VAC -stoma--mucocutaneous separation, pink and viable, continue purulent drainage 3-12 oclock -miralax -up with therapies -abd binder  -no new intervention  LOS: 14 days    Fedora Knisely E 07/10/2015, 8:18 AM Pager: [phone]

## 2015-07-10 NOTE — Progress Notes (Signed)
Pt asleep when I checked to ask about wearing CPAP. Per RN, pt has been refusing CPAP, and just wearing Butler to sleep. RT will continue to monitor.

## 2015-07-10 NOTE — Consult Note (Signed)
WOC follow-up: Ostomy pouching: Ostomy pouch changed today related to leakage behind barrier.This has remained in place without leakage since Friday. CCS team at bedside this AM to assess abd wound and stoma. Applied 2 piece pouch with barrier ring. Stoma is 100% red and moist, slightly above skin level, 1 1/2 inches, mucutaneous separation occurring to 50% of peristomal edges, from 6:00 o'clock to 12:00 o'clock with large amt thick tan pus and some odor.Red granulating tissue underneath when pus is absorbed.  Mod amt thick tan drainage in pouch, some semi-formed stool. Education provided: Demonstrated pouch change using 2 piece pouch and a barrier ring to attempt to maintain seal. Applied strip of Aquacel packing into separation edges of stoma. Enrolled patient in [employer] program: No Ostomy supplies and instructions at the bedside for nurse use. Pt watched procedure and asked appropriate questions. She will need total assistance with pouching upon discharge until this complex problem is resolved.  Wound type: Full thickness midline abd wound, CCS following for assessment and plan of care.Changed Vac dressing. no further pus at this time, no odor. Full thickness post-op wound 95% red, 5% yellow slough in patchy areas. Applied Mepitel contact layer, then one piece of white foam to lower inner wound, then one piece of black foam to 156m cont suction. Mod amt tan drainage in cannister, no odor. Pt tolerated with mod amt discomfort. Plan to change dressing on M/W/F at 0800.  DJulien GirtMSN, RN, CWOCN, CWCN-AP, CNS

## 2015-07-11 ENCOUNTER — Inpatient Hospital Stay (HOSPITAL_COMMUNITY): Payer: Medicaid Other | Admitting: Physical Therapy

## 2015-07-11 ENCOUNTER — Inpatient Hospital Stay (HOSPITAL_COMMUNITY): Payer: Medicaid Other

## 2015-07-11 DIAGNOSIS — D62 Acute posthemorrhagic anemia: Secondary | ICD-10-CM

## 2015-07-11 LAB — GLUCOSE, CAPILLARY
GLUCOSE-CAPILLARY: 86 mg/dL (ref 65–99)
GLUCOSE-CAPILLARY: 80 mg/dL (ref 65–99)
GLUCOSE-CAPILLARY: 97 mg/dL (ref 65–99)
Glucose-Capillary: 87 mg/dL (ref 65–99)

## 2015-07-11 LAB — URINALYSIS, ROUTINE W REFLEX MICROSCOPIC
BILIRUBIN URINE: NEGATIVE
GLUCOSE, UA: NEGATIVE mg/dL
HGB URINE DIPSTICK: NEGATIVE
Ketones, ur: NEGATIVE mg/dL
Nitrite: POSITIVE — AB
PROTEIN: NEGATIVE mg/dL
Specific Gravity, Urine: 1.013 (ref 1.005–1.030)
UROBILINOGEN UA: 1 mg/dL (ref 0.0–1.0)
pH: 5.5 (ref 5.0–8.0)

## 2015-07-11 LAB — URINE MICROSCOPIC-ADD ON

## 2015-07-11 LAB — CBC
HEMATOCRIT: 26.6 % — AB (ref 36.0–46.0)
Hemoglobin: 7.9 g/dL — ABNORMAL LOW (ref 12.0–15.0)
MCH: 25.2 pg — ABNORMAL LOW (ref 26.0–34.0)
MCHC: 29.7 g/dL — AB (ref 30.0–36.0)
MCV: 85 fL (ref 78.0–100.0)
Platelets: 443 10*3/uL — ABNORMAL HIGH (ref 150–400)
RBC: 3.13 MIL/uL — ABNORMAL LOW (ref 3.87–5.11)
RDW: 22.7 % — ABNORMAL HIGH (ref 11.5–15.5)
WBC: 10.9 10*3/uL — AB (ref 4.0–10.5)

## 2015-07-11 NOTE — Progress Notes (Signed)
Physical Therapy Session Note  Patient Details  Name: Dominique [name] MRN: [id number] Date of Birth: [birth year]  Today's Date: 07/11/2015 PT Individual Time: 1100-1200 PT Individual Time Calculation (min): 60 min   Short Term Goals:  Week 3:  PT Short Term Goal 1 (Week 3): Pt will demonstrate dynamic sitting balance with min A for slideboards and functional activities EOB and edge of mat x 10-15 min PT Short Term Goal 2 (Week 3): Pt will perform sit <> stand from elevated mat with appropriate lift equipment and max A and maintain standing 1-2 minutes at a time PT Short Term Goal 3 (Week 3): Pt will perform slideboard w/c <> bed with consistent mod A PT Short Term Goal 4 (Week 3): Pt will perform bed mobility with consistent min A and use of bed controls PT Short Term Goal 5 (Week 3): Pt will perform w/c mobility x 150' in controlled environment with supervision  Skilled Therapeutic Interventions/Progress Updates:  Maxi move bariatric shuttle chair> bed , with pt directing care.  Ostomy patch leaking slightly; RN called.  She will order more; placed tape at area that was leaking, which resolved it.  Therapeutic exercise performed with bil LEs to increase strength for functional mobility: in supine, 10 x 2 bil hip abd/add against slight resistance, focusing on neutral hip rotation; 10 x 1 each active assistive R/L straight leg raise, bil hip IR; 10 x 2 ankle pumps, bil ankle circles to "draw" alphabet with feet.  Bed mobility with set-up assist for bil hip and knee flex and to plant feet on bed.  Using bed rails and sheets attached to bed rails, pt rolled R with +2 mod assist, x 2; to L with rail without sheet with +2 mod assist fading to min assist of 1 after 5 repetitions.  Pt able to "shimmy" upper body laterally L><R on bed in preparation for rolling, and lower body x 2 to R.  Extensive emotional support provided during session, as pt is very sad that she is missing her daughter's birthday  tomorrow.    Therapy Documentation Precautions:  Precautions Precautions: Fall Precaution Comments: abdominal wound vac, colostomy Required Braces or Orthoses: Other Brace/Splint Other Brace/Splint: abdominal binder when OOB Restrictions Weight Bearing Restrictions: No   Pain: Pain Assessment Pain Assessment: 0-10 Pain Score: 8  Pain Type: Surgical pain Pain Location: Abdomen Pain Orientation: Medial Pain Descriptors / Indicators: Tender Pain Onset: On-going Pain Intervention(s): Medication (See eMAR)        See Function Navigator for Current Functional Status.   Therapy/Group: Individual Therapy  Dominique [name] 07/11/2015, 12:16 PM

## 2015-07-11 NOTE — Progress Notes (Signed)
Physical Therapy Weekly Progress Note  Patient Details  Name: Dominique [name] MRN: [id number] Date of Birth: 03-Sep-1981  Beginning of progress report period: July 04, 2015 End of progress report period: July 11, 2015  Today's Date: 07/11/2015 PT Individual Time: 1440-1520 PT Individual Time Calculation (min): 40 min   Patient has made slow but steady progress this week and has met 2 of 5 short term goals.  Pt is demonstrating improved activity tolerance and participation in therapy and has progressed to one person max A for rolling and sup<>sit, supervision for w/c mobility short distances, has been able to tolerate ~10 minutes on tilt table at 60-70 deg upright but does experience increase in HR and decrease in BP, requires min-mod A for dynamic sitting balance edge of bed/mat but continues to require +2 total A for transfers bed <> chair transfers with lift equipment and has been unable to stand despite increased strength and activation in each LE.  Pt continues to be very fearful of falling.  Patient continues to demonstrate the following deficits: bilat LE, UE and core weakness, impaired activity tolerance, impaired postural control, balance, unable to stand/ambulate and therefore will continue to benefit from skilled PT intervention to enhance overall performance with activity tolerance, balance, postural control, ability to compensate for deficits and functional use of  right lower extremity and left lower extremity.  Patient not progressing toward long term goals.  See goal revision..  Plan of care revisions: Goals downgraded to min-mod A overall w/c level, gait and stairs D/C due to slow progress.  PT Short Term Goals Week 2:  PT Short Term Goal 1 (Week 2): Pt will initiate and assist with rolling in bed with mod A of one person PT Short Term Goal 1 - Progress (Week 2): Met PT Short Term Goal 2 (Week 2): Pt will perform supine > sit with mod A of one person PT Short Term Goal 2 -  Progress (Week 2): Progressing toward goal PT Short Term Goal 3 (Week 2): Pt will perform transfers bed <> w/c with slideboard and max A of one person PT Short Term Goal 3 - Progress (Week 2): Not met PT Short Term Goal 4 (Week 2): Pt will perform w/c mobility in manual w/c x 50' with mod A  PT Short Term Goal 4 - Progress (Week 2): Met PT Short Term Goal 5 (Week 2): Pt will tolerate standing at 80-90 deg with use of tilt table x 10 minutes while performing LE exercises and WB PT Short Term Goal 5 - Progress (Week 2): Progressing toward goal Week 3:  PT Short Term Goal 1 (Week 3): Pt will demonstrate dynamic sitting balance with min A for slideboards and functional activities EOB and edge of mat x 10-15 min PT Short Term Goal 2 (Week 3): Pt will perform sit <> stand from elevated mat with appropriate lift equipment and max A and maintain standing 1-2 minutes at a time PT Short Term Goal 3 (Week 3): Pt will perform slideboard w/c <> bed with consistent mod A PT Short Term Goal 4 (Week 3): Pt will perform bed mobility with consistent min A and use of bed controls PT Short Term Goal 5 (Week 3): Pt will perform w/c mobility x 150' in controlled environment with supervision  Skilled Therapeutic Interventions/Progress Updates:   Pt received in bed. Pt stating she really enjoyed the new exercises she was taught by earlier PT but pt is concerned that therapy feels she will never walk again.  Discussed with pt that she has the potential to ambulate in the future and is making slow progress towards the goal of ambulation.  Also discussed that the purpose of all therapy sessions is to work towards strengthening her LE and core to prepare for ambulation but due to wound healing and deconditioning, her body requires increased time to heal and make progress.  Pt felt encouraged by this.  Pt agreeable to LE WB exercises in reverse Trendelenberg.  Lowered body for WB through feet on footboard and performed 2 sets x 12  reps squats, heel raises and single leg marching with increased ROM in bilat hip and knees.  Returned to supine and performed bridging and scooting upright in bed with mod A.  Pt set up on partial L sidelying and left with all items within reach.  Therapy Documentation Precautions:  Precautions Precautions: Fall Precaution Comments: abdominal wound vac, colostomy Required Braces or Orthoses: Other Brace/Splint Other Brace/Splint: abdominal binder when OOB Restrictions Weight Bearing Restrictions: No Vital Signs: Therapy Vitals Temp: 98.5 F (36.9 C) Temp Source: Oral Pulse Rate: 93 Resp: 18 BP: (!) 104/52 mmHg Patient Position (if appropriate): Lying Oxygen Therapy SpO2: 96 % O2 Device: Not Delivered Pain:  No c/o pain  See Function Navigator for Current Functional Status.  Therapy/Group: Individual Therapy   Raylene Everts Faucette 07/11/2015, 4:45 PM

## 2015-07-11 NOTE — Progress Notes (Signed)
Occupational Therapy Session Note  Patient Details  Name: Dominique [name] MRN: [id number] Date of Birth: 1981/09/11  Today's Date: 07/11/2015 OT Individual Time: 0830-1000 OT Individual Time Calculation (min): 90 min   Skilled Therapeutic Interventions/Progress Updates:  Therapeutic activity. Focus on bed mobility with initiation of roll at hips and rotating head shoulders along with body movement.  Pt continues to require A with moving her breast when rolling to help with body position.  Pt also continues to require max cuing for sequence to roll on the way on to a side (right and left) for placement of lift pad.  Used maxi moved to transfer into Shuttle. 2 pillows placed in seat for more comfort to encourage pt to sit up more throughout the day.  Transitioned to the gym and onto the EOM via maxi move.  Pt with difficulty with colostomy leaking and nursing had to come and address. Addressed in core strengthening with sitting in a reclined position on a wedge to coming into upright position.  Pt required +2 to achieve coming into upright position. Continued to provide opportunities for weight shifting through LEs with forward weight shift and attempts stand.  Today tried using the maxi sky walking sling around her for security, tactile input to come up into upright position, and assist with weight. Attempted 3x unable to come achieve complete bottom clearance with mat at least elevated 24 inches up. Transitioned back to Shuttle via maxi move and back to room.     Therapy Documentation Precautions:  Precautions Precautions: Fall Precaution Comments: abdominal wound vac, colostomy Required Braces or Orthoses: Other Brace/Splint Other Brace/Splint: abdominal binder when OOB Restrictions Weight Bearing Restrictions: No Pain: Pain Assessment Pain Assessment: 0-10 Pain Score: 0-No pain Pain Type: Surgical pain Pain Location: Abdomen Pain Orientation: Medial Pain Descriptors / Indicators:  Tender Pain Onset: On-going Pain Intervention(s): Medication (See eMAR) ADL: ADL ADL Comments: see Functional Assessment Exercises:   Other Treatments:    See Function Navigator for Current Functional Status.   Therapy/Group: Individual Therapy  Willeen Cass [facility] 07/11/2015, 10:14 AM

## 2015-07-11 NOTE — Plan of Care (Signed)
Problem: RH Ambulation Goal: LTG Patient will ambulate in controlled environment (PT) LTG: Patient will ambulate in a controlled environment, # of feet with assistance (PT).  Outcome: Not Applicable Date Met:  06/77/03 D/C due to slow progress; 07/11/15 Goal: LTG Patient will ambulate in home environment (PT) LTG: Patient will ambulate in home environment, # of feet with assistance (PT).  Outcome: Not Applicable Date Met:  40/35/24 D/C due to slow progress; 07/11/15  Problem: RH Stairs Goal: LTG Patient will ambulate up and down stairs w/assist (PT) LTG: Patient will ambulate up and down # of stairs with assistance (PT)  Outcome: Not Applicable Date Met:  81/85/90 D/C due to slow progress; 07/11/15

## 2015-07-11 NOTE — Progress Notes (Signed)
Subjective/Complaints: Appreciate WOC and CCS notes ROS:  No CP or SOB, no increased limb swelling Objective: Vital Signs: Blood pressure 126/68, pulse 96, temperature 98.1 F (36.7 C), temperature source Oral, resp. rate 18, weight 167.831 kg (370 lb), SpO2 99 %. No results found. Results for orders placed or performed during the hospital encounter of 06/26/15 (from the past 72 hour(s))  Glucose, capillary     Status: None   Collection Time: 07/08/15 11:39 AM  Result Value Ref Range   Glucose-Capillary 97 65 - 99 mg/dL   Comment 1 Notify RN   Glucose, capillary     Status: Abnormal   Collection Time: 07/08/15  4:29 PM  Result Value Ref Range   Glucose-Capillary 102 (H) 65 - 99 mg/dL   Comment 1 Notify RN   Glucose, capillary     Status: Abnormal   Collection Time: 07/08/15  9:33 PM  Result Value Ref Range   Glucose-Capillary 106 (H) 65 - 99 mg/dL  Glucose, capillary     Status: None   Collection Time: 07/09/15  6:36 AM  Result Value Ref Range   Glucose-Capillary 81 65 - 99 mg/dL  Glucose, capillary     Status: None   Collection Time: 07/09/15 12:12 PM  Result Value Ref Range   Glucose-Capillary 96 65 - 99 mg/dL   Comment 1 Document in Chart   Glucose, capillary     Status: None   Collection Time: 07/09/15  4:45 PM  Result Value Ref Range   Glucose-Capillary 98 65 - 99 mg/dL  Glucose, capillary     Status: Abnormal   Collection Time: 07/09/15  9:10 PM  Result Value Ref Range   Glucose-Capillary 115 (H) 65 - 99 mg/dL  Glucose, capillary     Status: None   Collection Time: 07/10/15  6:45 AM  Result Value Ref Range   Glucose-Capillary 91 65 - 99 mg/dL  Glucose, capillary     Status: None   Collection Time: 07/10/15 11:17 AM  Result Value Ref Range   Glucose-Capillary 96 65 - 99 mg/dL  Glucose, capillary     Status: None   Collection Time: 07/10/15  4:43 PM  Result Value Ref Range   Glucose-Capillary 99 65 - 99 mg/dL   Comment 1 Notify RN   Glucose, capillary      Status: None   Collection Time: 07/10/15  9:23 PM  Result Value Ref Range   Glucose-Capillary 95 65 - 99 mg/dL   Comment 1 Notify RN   Glucose, capillary     Status: None   Collection Time: 07/11/15  6:32 AM  Result Value Ref Range   Glucose-Capillary 86 65 - 99 mg/dL   Comment 1 Notify RN      HEENT: light sensitive Right eye Cardio: RRR and no murmur Resp: CTA B/L and unlabored GI: BS positive and LLQ colostomy, midline open abd wound with Wet to Dry ,  Extremity:  No Edema Skin:   Wound see above, no heel ulceration Neuro: Alert/Oriented and Abnormal Motor 5/5 in BUE, 3- bilateral HF,able to lift heel off bed ~3 inches when supine 4- Knee ext and ankle DF/PF Musc/Skel:  Other no pain with UE and LE ROM Gen NAD   Assessment/Plan: 1. Functional deficits secondary to debilitation/Crohn's disease/perforated left colon with peritonitis/exploratory laparotomy with drainage of intra-abdominal abscess partial colon resection with colostomy which require 3+ hours per day of interdisciplinary therapy in a comprehensive inpatient rehab setting. Physiatrist is providing close team supervision and  24 hour management of active medical problems listed below. Physiatrist and rehab team continue to assess barriers to discharge/monitor patient progress toward functional and medical goals.   FIM: Function - Bathing Position: Other (comment) (Edge of bed for upper body and supine/HOB elevated for lower) Body parts bathed by patient: Right arm, Left arm, Chest, Abdomen, Right upper leg, Left upper leg Body parts bathed by helper: Left lower leg, Right lower leg, Front perineal area Bathing not applicable: Abdomen, Buttocks, Front perineal area (peri care done prior to OT session) Assist Level: Touching or steadying assistance(Pt > 75%)  Function- Upper Body Dressing/Undressing What is the patient wearing?: Hospital gown Bra - Perfomed by patient: Thread/unthread right bra strap, Thread/unthread  left bra strap Bra - Perfomed by helper: Hook/unhook bra (pull down sports bra) Pull over shirt/dress - Perfomed by patient: Thread/unthread right sleeve, Thread/unthread left sleeve Pull over shirt/dress - Perfomed by helper: Pull shirt over trunk Assist Level: Touching or steadying assistance(Pt > 75%) Set up : To obtain clothing/put away Function - Lower Body Dressing/Undressing What is the patient wearing?: Non-skid slipper socks Position: Bed Pants- Performed by patient: Pull pants up/down Pants- Performed by helper: Thread/unthread right pants leg, Thread/unthread left pants leg, Pull pants up/down Non-skid slipper socks- Performed by patient: Don/doff right sock, Don/doff left sock Non-skid slipper socks- Performed by helper: Don/doff right sock, Don/doff left sock Assist for lower body dressing: Touching or steadying assistance (Pt > 75%)  Function - Toileting Toileting activity did not occur: Safety/medical concerns Toileting steps completed by patient: Adjust clothing prior to toileting, Performs perineal hygiene, Adjust clothing after toileting Toileting steps completed by helper: Adjust clothing prior to toileting, Performs perineal hygiene, Adjust clothing after toileting Assist level: Touching or steadying assistance (Pt.75%)  Function - Air cabin crew transfer activity did not occur: Safety/medical concerns Toilet transfer assistive device: Mechanical lift Mechanical lift: Maximove Assist level to toilet: 2 helpers Assist level from toilet: 2 helpers  Function - Chair/bed transfer Chair/bed transfer activity did not occur: Safety/medical concerns Chair/bed transfer method: Other Chair/bed transfer assist level: 2 helpers Chair/bed transfer assistive device: Mechanical lift Mechanical lift: Maximove Chair/bed transfer details: Verbal cues for safe use of DME/AE, Manual facilitation for placement  Function - Locomotion: Wheelchair Will patient use wheelchair  at discharge?: Yes Type: Manual Wheelchair activity did not occur: Safety/medical concerns (debility and awaiting appropriate equipment) Max wheelchair distance: 80' Assist Level: Touching or steadying assistance (Pt > 75%) Assist Level: Dependent (Pt equals 0%) Assist Level: Dependent (Pt equals 0%) Turns around,maneuvers to table,bed, and toilet,negotiates 3% grade,maneuvers on rugs and over doorsills: No Function - Locomotion: Ambulation Ambulation activity did not occur: Safety/medical concerns (significant debility)  Function - Comprehension Comprehension: Auditory Comprehension assist level: Follows complex conversation/direction with no assist  Function - Expression Expression: Verbal Expression assist level: Expresses complex ideas: With no assist  Function - Social Interaction Social Interaction assist level: Interacts appropriately with others - No medications needed.  Function - Problem Solving Problem solving assist level: Solves complex problems: Recognizes & self-corrects  Function - Memory Memory assist level: Complete Independence: No helper Patient normally able to recall (first 3 days only): Current season, Location of own room, Staff names and faces, That he or she is in a hospital   Medical Problem List and Plan: 1. Functional deficits secondary to debilitation/Crohn's disease/perforated left colon with peritonitis/exploratory laparotomy with drainage of intra-abdominal abscess partial colon resection with colostomy. Restarted  Pentasa 500 mg 4 times a day on 10/15  was several days without it -nausea may be Crohn's related, better today, will monitor intake as well as colostomy output 2.  DVT Prophylaxis/Anticoagulation: Subcutaneous heparin. Monitor platelet counts, currently no signs of bleeding, cbc in am 3. Pain Management: Hydrocodone as needed 4. Acute blood loss anemia. Monitor while on heparin, hgb 8.0 on 10/10, recheck in am 5. Neuropsych: This patient  is capable of making decisions on her own behalf. 6. Skin/Wound Care: dehiscence noted at muco cutaneous jct of stoma- CCS seeing pt on M-W-F and WOC RN following, s/p placement of wound vac,  7. Fluids/Electrolytes/Nutrition: Strict I&O with follow-up chemistries, 20-30% meals 8. Acute renal insufficiency 2.64-2.84. Creatinine latest result 1.09. Follow-up chemistries stable,  9. Acute respiratory failure/VDRF. Extubated 06/14/2015.propable obesity hypoventilation syndrome, cont PRN O2 10. SVT. Monitor heart rate. Lopressor 50 mg twice a day, pt also severely deconditioned with poor exercise tolerance, am HR 94, cont current dose 11. Chronic systolic congestive heart failure. Monitor for any signs of fluid overload. Lasix 40 mg twice a day, monitor intake and creat, no SOB, sats ok, no change in dose at present 12.  UTI ecoli- s/p treatment recheck UA 13. Morbid obesity. Body mass index 67.33 14.  Mild hypokalemia - will supplement  15.  Severe hypoalbuminemia- supplement , this is contributing to delayed wound healing , doesn't tolerate beneprotein but drinks ensure 15.  Dry ganagrene R 5th digit due to microembolism , monitor for demarcation no surgical intervention needed at this poin per [facility]- appreciate consult  LOS (Days) 15 A FACE TO FACE EVALUATION WAS PERFORMED  Jamaiya Tunnell E 07/11/2015, 7:14 AM

## 2015-07-11 NOTE — Progress Notes (Signed)
Occupational Therapy Weekly Progress Note  Patient Details  Name: Dominique [name] MRN: [id number] Date of Birth: 11-Jan-1981  Beginning of progress report period: July 05, 2015 End of progress report period: July 11, 2015  Today's Date: 07/11/2015 OT Individual Time:1030-1100 33 Min  Patient has met 2 of 3 short term goals.  Pt able to tolerate unsupported sitting posture when challenged at EOB using bed rail to stabilize as needed.    Pt will to participate in sessions as directed with improved awareness of need for DME for safety.  Patient continues to demonstrate the following deficits: Impaired endurance, impaired sitting balance, BUE weakness, impaired mobility, pain,  and therefore will continue to benefit from skilled OT intervention to enhance overall performance with Reduce care partner burden.  Patient not progressing toward long term goals.  See goal revision..  Plan of care revisions: Overall goals downgraded to Moderate Assist d/t impaired mobility and complex wound care managment. .  OT Short Term Goals Week 2:  OT Short Term Goal 1 (Week 2): Pt will complete upper body dressing sitting unsupported at EOB with Min assist. OT Short Term Goal 1 - Progress (Week 2): Met OT Short Term Goal 2 (Week 2): Pt will complete grooming sititng at EOB unsupported with supervision and setup OT Short Term Goal 2 - Progress (Week 2): Met OT Short Term Goal 3 (Week 2): Pt will complete slide board transfer, w/c <> raised mat, with +2 assist only (no Maxi-Sky sling used) OT Short Term Goal 3 - Progress (Week 2): Progressing toward goal Week 3:  OT Short Term Goal 1 (Week 3): Pt will complete slide board transfer, w/c <> raised mat, with +2 assist only (no mechanical lift assist support required) OT Short Term Goal 2 (Week 3): Pt will demo ability to direct caregiver(s) with transfer sequence using mechanical lift with min questioning cues OT Short Term Goal 3 (Week 3): Pt will tolerate  supported upright sitting posture for 2 consecutive hours with total time in position for at least 4 hours daily.  Skilled Therapeutic Interventions/Progress Updates: Therapetyutic exercise (30 min) with focus on upper body strengthening using grade 2 (orange) therband.   Pt received seated in bariatric shuttle chair awaiting therapist.   Pt completes therex with 1:1 supervision and manual guidance for correct posture and movement during exercises.   Pt requires rest breaks between exercises and was unable to perform 2 exercises (scapular chest pull and seated row) correctly d/t shoulder weakness.   Pt relates awareness of possible discharge to SNF d/t necessity of continued complex medical care and need for DME to perform transfers and functional mobility not available for home use.     Therapy Documentation Precautions:  Precautions Precautions: Fall Precaution Comments: abdominal wound vac, colostomy Required Braces or Orthoses: Other Brace/Splint Other Brace/Splint: abdominal binder when OOB Restrictions Weight Bearing Restrictions: No Therapy Vitals Temp: 98.5 F (36.9 C) Temp Source: Oral Pulse Rate: 93 Resp: 18 BP: (!) 104/52 mmHg Patient Position (if appropriate): Lying Oxygen Therapy SpO2: 96 % O2 Device: Not Delivered Pain: Pain Assessment Pain Assessment: 0-10 Pain Score: 8  Pain Type: Surgical pain Pain Location: Abdomen Pain Orientation: Medial Pain Descriptors / Indicators: Tender Pain Onset: On-going Pain Intervention(s): Medication (See eMAR)   ADL: ADL ADL Comments: see Functional Assessment   Exercises: General Exercises - Upper Extremity Shoulder Flexion: Strengthening;10 reps;Seated;Theraband;Both Theraband Level (Shoulder Flexion): Level 2 (Red) Shoulder Extension: Strengthening;Both;10 reps;Seated;Theraband Theraband Level (Shoulder Extension): Level 2 (Red) Elbow Flexion: Strengthening;Both;10 reps;Theraband;Seated  Theraband Level (Elbow Flexion):  Level 2 (Red) Elbow Extension: Strengthening;Both;10 reps;Seated;Theraband Theraband Level (Elbow Extension): Level 2 (Red)   See Function Navigator for Current Functional Status.   Therapy/Group: Individual Therapy  [place] 07/11/2015, 4:01 PM

## 2015-07-11 NOTE — Progress Notes (Signed)
Pt refuse CPAP. RN aware

## 2015-07-12 ENCOUNTER — Telehealth: Payer: Self-pay | Admitting: Pulmonary Disease

## 2015-07-12 ENCOUNTER — Inpatient Hospital Stay (HOSPITAL_COMMUNITY): Payer: Medicaid Other | Admitting: Occupational Therapy

## 2015-07-12 ENCOUNTER — Inpatient Hospital Stay (HOSPITAL_COMMUNITY): Payer: Medicaid Other | Admitting: Physical Therapy

## 2015-07-12 LAB — URINE MICROSCOPIC-ADD ON

## 2015-07-12 LAB — BASIC METABOLIC PANEL
Anion gap: 12 (ref 5–15)
BUN: 19 mg/dL (ref 6–20)
CALCIUM: 7.7 mg/dL — AB (ref 8.9–10.3)
CHLORIDE: 95 mmol/L — AB (ref 101–111)
CO2: 32 mmol/L (ref 22–32)
CREATININE: 1.27 mg/dL — AB (ref 0.44–1.00)
GFR calc non Af Amer: 54 mL/min — ABNORMAL LOW (ref >60)
Glucose, Bld: 97 mg/dL (ref 65–99)
Potassium: 3.9 mmol/L (ref 3.5–5.1)
SODIUM: 139 mmol/L (ref 135–145)

## 2015-07-12 LAB — GLUCOSE, CAPILLARY
GLUCOSE-CAPILLARY: 89 mg/dL (ref 65–99)
GLUCOSE-CAPILLARY: 96 mg/dL (ref 65–99)
Glucose-Capillary: 88 mg/dL (ref 65–99)
Glucose-Capillary: 91 mg/dL (ref 65–99)

## 2015-07-12 LAB — URINALYSIS, ROUTINE W REFLEX MICROSCOPIC
GLUCOSE, UA: NEGATIVE mg/dL
KETONES UR: 15 mg/dL — AB
Nitrite: POSITIVE — AB
Protein, ur: NEGATIVE mg/dL
Specific Gravity, Urine: 1.018 (ref 1.005–1.030)
Urobilinogen, UA: 1 mg/dL (ref 0.0–1.0)
pH: 5 (ref 5.0–8.0)

## 2015-07-12 MED ORDER — SODIUM CHLORIDE 0.45 % IV SOLN
INTRAVENOUS | Status: DC
  Administered 2015-07-12 – 2015-07-13 (×2): via INTRAVENOUS

## 2015-07-12 NOTE — Progress Notes (Signed)
Occupational Therapy Session Note  Patient Details  Name: Dominique [name] MRN: [id number] Date of Birth: 05-Nov-1980  Today's Date: 07/12/2015 OT Individual Time: 1400-1438 OT Individual Time Calculation (min): 38 min    Skilled Therapeutic Interventions/Progress Updates:    Pt worked on UE exercises with green and orange theraband before transitioning from bed to wheelchair.  Pt's O2 sats 95% on room air with HR at 95 as well.  Pt with noted stool drainage from the ileostomy bag as well.  Therapist noted that bag had not been sealed completely and was able to re-seal and help pt change hospital gown in supine position.  Pt needed mod instructional cueing for shoulder flexion bilaterally and elbow flexion/extension.  Attempted use of green bands for first set of shoulder flexion but pt unable to complete without mod assist.  Transitioned to use of the orange bands with pt needing min assist to complete 1 set of 10 repetitions for each UE.  She was able to perform one set of elbow flexion as well as one set of elbow extension for each UE with supervision using the green bands.  Pt transferred via maxi move to the wheelchair to end session.  Total +2 assistance for safe transfer.      Therapy Documentation Precautions:  Precautions Precautions: Fall Precaution Comments: abdominal wound vac, colostomy Required Braces or Orthoses: Other Brace/Splint Other Brace/Splint: abdominal binder when OOB Restrictions Weight Bearing Restrictions: No  Pain: Pain Assessment Pain Assessment: No/denies pain ADL: ADL ADL Comments: see Functional Assessment   See Function Navigator for Current Functional Status.   Therapy/Group: Individual Therapy  Mina Babula OTR/L 07/12/2015, 2:46 PM

## 2015-07-12 NOTE — Telephone Encounter (Signed)
I have tried to contact the patient at the number listed [phone] per Dr. Bari Mantis note the patient needs an office visit with Tammy Parrett. When I call the # states the # is incorrect and has no voice mail.

## 2015-07-12 NOTE — Progress Notes (Signed)
Occupational Therapy Session Note  Patient Details  Name: Dominique [name] MRN: [id number] Date of Birth: [birth year]  Today's Date: 07/12/2015 OT Individual Time: 1000-1100 OT Individual Time Calculation (min): 60 min    Skilled Therapeutic Interventions/Progress Updates:    1:1 Pt in bed when arrived.  Rn came in to take out foley and perform peri care for pt. Performed bed mobility with mod A (+2) to roll to place maxi move pad underneath patient.  Pt continues to require A for breast placement/ positioning and A to rotate trunk and hip towards direction she is rolling. PT transitioned into w/c via maxi move and transitioned down to the gym.  Focus on slide board transfers w/c<mat and then return mat to w/c. Positioned bariatric maxi sky walking sling around pt for security and comfort but still allow use of UB. Feet placed on green step to increased weight bearing through LEs during transfer.  Pt able to verbalized head/ hip relationship but required  Max to total tactile and verbal cues for maintaining forward weight shift during scoots. Both slide board transfers max A +3 (3rd person stabilizing equipment, one person in front of pt to promote maintaining forward weight shift and person behind to help weight shift bottom. Pt required frequent rest breaks throughout session due to abdominal spasms.  Returned to room and transferred back to bed via maxi move and left with Rn.  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 Rn addressing dressing change.   Therapy Documentation Precautions:  Precautions Precautions: Fall Precaution Comments: abdominal wound vac, colostomy Required Braces or Orthoses: Other Brace/Splint Other Brace/Splint: abdominal binder when OOB Restrictions Weight Bearing Restrictions: No Pain:  ongoing abdominal spasms- requiring frequent rest breaks  See Function Navigator for Current Functional Status.   Therapy/Group: Individual Therapy  Willeen Cass [facility] 07/12/2015, 7:45 PM

## 2015-07-12 NOTE — Progress Notes (Signed)
Nutrition Follow-up  DOCUMENTATION CODES:   Morbid obesity  INTERVENTION:   Continue Ensure Enlive po TID, each supplement provides 350 kcal and 20 grams of protein.  Provide Magic cup between meals, each supplement provides 290 kcal and 9 grams of protein.  Encourage adequate PO intake.   NUTRITION DIAGNOSIS:   Increased nutrient needs related to wound healing as evidenced by estimated needs.  GOAL:   Patient will meet greater than or equal to 90% of their needs  MONITOR:   PO intake, Supplement acceptance, Weight trends, Labs, I & O's  REASON FOR ASSESSMENT:   Consult Poor PO  ASSESSMENT:   34 y.o. right handed morbidly obese female admitted 06/04/2015 with noted history of asthma, chronic systolic congestive heart failure and recent recurrent colitis since January 2016. Underwent sigmoidoscopy 06/05/2015 showing severe chronic inflammatory changes in the proximal sigmoid and descending colon. Biopsies diagnosed with Crohn disease  Underwent exploratory laparotomy drainage of intra-abdominal abscess partial colon resection application of wound VAC 06/09/2015 per Dr. 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 Pt reports she has been experiencing abdominal discomfort postprandial and thus has been afraid to eat her food at meals. Pt does reports she has been consuming her Ensure and it does not upset her stomach. RD to continue with current orders. Pt is agreeable to additional supplements to aid in adequate nutrition of Magic cup between meals. Pt was encouraged to eat food at meals that do not upset her stomach and to consume her supplements. RD to continue to monitor.   Diet Order:  DIET SOFT Room service appropriate?: Yes; Fluid consistency:: Thin  Skin:  Wound (see comment) (Incision on abdomen, wound VAC)  Last BM:  10/18 ostomy  Height:   Ht Readings from Last 1 Encounters:  06/03/15  5' 2"  (1.575 m)    Weight:   Wt Readings from Last 1 Encounters:  07/12/15 368 lb (166.924 kg)    Ideal Body Weight:  50 kg  BMI:  Body mass index is 67.29 kg/(m^2).  Estimated Nutritional Needs:   Kcal:  2200-2400  Protein:  120-130 grams  Fluid:  2.2 - 2.4 L/day  EDUCATION NEEDS:   Education needs addressed  Corrin Parker, MS, RD, LDN Pager # [phone] After hours/ weekend pager # [phone]

## 2015-07-12 NOTE — Progress Notes (Signed)
RN called to dayroom around 3:30 pm in response to an episode of hypotension during her physical therapy session. Rapid response was paged to the bedside. The Lowest B/P we obtained was 56/21. Pt was on tilt table at the time. Pt was also on 4L of O2 via Nasal Cannula at the time I arrived at the bedside. Pt's oxygen saturations were stable. IV nurse was paged to obtain IV access and PA Louellen Molder was paged. PA saw pt at bedside and ordered a BMET and fluids to help increase her blood pressure. Pt was transferred to a stretcher chair via maximove and pt's blood pressure stabilized once pt was settled. Pt's blood pressure once settled was 106/61 and pt reports no longer feeling hypotensive. RN will continue to monitor the pt's blood pressure as fluids are started.

## 2015-07-12 NOTE — Consult Note (Addendum)
WOC follow-up: Ostomy pouching: Ostomy pouch changed today related to leakage behind barrier.Applied 2 piece pouch with barrier ring. Stoma is 100% red and moist, slightly above skin level, 1 1/2 inches, mucutaneous separation occurring to 50% of peristomal edges, from 6:00 o'clock to 12:00 o'clock withmod amt thick tan pus and some odor.Red granulating tissue underneath when pus is absorbed. Stool was in the woundbed to the peristomal area.  3X2X2cm Mod amt thick tan drainage in pouch with mod amt semi-formed brown stool. Education provided: Demonstrated pouch change using 2 piece pouch and a barrier ring to attempt to maintain seal. Applied strip of Aquacel packing into separation edges of stoma. Enrolled patient in [place] program: No Ostomy supplies and instructions at the bedside for nurse use. Pt watched procedure and asked appropriate questions. She will need total assistance with pouching upon discharge until this complex problem is resolved.  Wound type: Full thickness midline abd wound, CCS following for assessment and plan of care.Changed Vac dressing. no further pus at this time, no odor. Full thickness post-op wound 95% red, 5% yellow slough in patchy areas. Applied Mepitel contact layer, then one piece of white foam to lower inner wound, then one piece of black foam to 155m cont suction. [id number] with undermining to 4 cm at 12:00 o'clock.Mod amt tan drainage in cannister, with strong odor. Pt tolerated with mod amt discomfort. Plan to change dressing on M/W/F at 0800.  DJulien GirtMSN, RN, CWOCN, CWCN-AP, CNS

## 2015-07-12 NOTE — Significant Event (Signed)
Rapid Response Event Note  Overview: Time Called: 1536 Arrival Time: 0938 Event Type: Hypotension  Initial Focused Assessment:  Called to see patient who was working with therapy on the tilt table when she developed hypotension 56/23.  Upon my arrival to patients room, RN and PT at bedside.  Patient was lying flat, on nasal cannula 4 lpm.  Patient skin is dry.  Patient states she feels dizzy, weak and tired.   Patient with no PIV, IV team called.   Interventions:  Patient transferred via lift to chair and placed in supine position and brought back to room.  BP rechecked 101/64.  Patient states she is feeling a little better and not dizzy at this time.  PA at bedside, Vaughan Basta, RN IV team at bedside.  Bmet drawn and sent to lab.     Event Summary:  RN to call if assistance needed   at      at          [facility], Harlin Rain

## 2015-07-12 NOTE — Progress Notes (Signed)
Physical Therapy Session Note  Patient Details  Name: Dominique [name] MRN: [id number] Date of Birth: 1981-03-11  Today's Date: 07/12/2015 PT Individual Time: 1445-1600 PT Individual Time Calculation (min): 75 min   Short Term Goals: Week 3:  PT Short Term Goal 1 (Week 3): Pt will demonstrate dynamic sitting balance with min A for slideboards and functional activities EOB and edge of mat x 10-15 min PT Short Term Goal 2 (Week 3): Pt will perform sit <> stand from elevated mat with appropriate lift equipment and max A and maintain standing 1-2 minutes at a time PT Short Term Goal 3 (Week 3): Pt will perform slideboard w/c <> bed with consistent mod A PT Short Term Goal 4 (Week 3): Pt will perform bed mobility with consistent min A and use of bed controls PT Short Term Goal 5 (Week 3): Pt will perform w/c mobility x 150' in controlled environment with supervision  Skilled Therapeutic Interventions/Progress Updates:    Pt received up in w/c after OT session - PT arrived 15 minutes late to scheduled session due to extended time required for patient conference immediately prior. W/C Management - see function tab for details. PT cues pt to place palm of hand flat on wheel for improved power pushing w/c with B UEs. Rest break taken in between bouts of pushing. Therapeutic Activity. Pt agreeable to continue practicing tilt table for improved upright tolerance. Maxi Move dependent transfer manual w/c to tilt table. PT tilts table to 40 degrees and then pt asks PT to stop, so pt acclimatizes to this degree for ~30 seconds. Pt asks PT to increase tilt to 50 degrees, but during incline, pt asks PT to stop and pt only at 45-46 degrees. PT already measuring BP when pt reports feeling hot, which is one of her symptoms of orthostatis. BP found to be high 70's/mid 50's. Pt returned to supine on tilt table for recovery, reports feeling sleepy. PT re-measures BP in supine after 2 minutes and BP still low, this time low  70's/low 50's. PT instructs tech to go obtain supplemental oxygen and nasal cannula, since pt typically uses this when napping in bed in room. As tech returning a few minutes later and places pt on 4 L O2/min, PT re-measures BP in supine and finds it to be mid-50's/low 20's. PT calls a rapid response. Rapid response nurse, pt's nurse, and nurse supervisor arrive quickly - PT updates nurses on pt's status and nurses agree pt should transfer to shuttle chair since it has the ability to lie in stretcher/flat position or chair. Nurses look for an IV, but pt has none. Tech goes to get shuttle chair, pt dependently transferred supine on tilt table to supine in shuttle chair and dependently rolled back to room. Once back in room, another provider arrives to begin placing an IV and pt's systolic blood pressure had improved to 101. Specific vital numbers not written down due to emergency of the situation and could not be obtained prior to writing this note due to that blood pressure machine was being used to continue to actively monitor pt.  Therapeutic Exercise - PT instructs pt in 10 mini squats at 40 degrees - minimal ROM due to safety strap over knees. Pt ended in room with PA and nursing assistance, reporting that she feels better. Continue to medically monitor pt to assess appropriateness of PT for tomorrow.   Therapy Documentation Precautions:  Precautions Precautions: Fall Precaution Comments: abdominal wound vac, colostomy Required Braces or Orthoses: Other  Brace/Splint Other Brace/Splint: abdominal binder when OOB Restrictions Weight Bearing Restrictions: No Vital Signs: See narrative Pain: Pain Assessment Pain Assessment: 0-10 Pain Type: Acute pain;Surgical pain Pain Location: Abdomen Pain Orientation: Anterior;Mid Pain Descriptors / Indicators: Cramping Pain Onset: Sudden Pain Intervention(s): Rest Multiple Pain Sites: No   See Function Navigator for Current Functional  Status.   Therapy/Group: Individual Therapy and Kelvin, PT Tech as +2  Daksh Coates M 07/12/2015, 4:19 PM

## 2015-07-12 NOTE — Progress Notes (Signed)
Subjective/Complaints: Appreciate WOC  Notes Discussed urine cx, evidence of recurrent UTI but was foley specimen, ? When foley last changed Pt doesn't feel ready for foley d/c used female urinal in acute care but required 1-2 person assist to position it  ROS:  No CP or SOB, no increased limb swelling Objective: Vital Signs: Blood pressure 122/57, pulse 97, temperature 98.3 F (36.8 C), temperature source Oral, resp. rate 18, weight 166.924 kg (368 lb), SpO2 97 %. No results found. Results for orders placed or performed during the hospital encounter of 06/26/15 (from the past 72 hour(s))  Glucose, capillary     Status: None   Collection Time: 07/09/15 12:12 PM  Result Value Ref Range   Glucose-Capillary 96 65 - 99 mg/dL   Comment 1 Document in Chart   Glucose, capillary     Status: None   Collection Time: 07/09/15  4:45 PM  Result Value Ref Range   Glucose-Capillary 98 65 - 99 mg/dL  Glucose, capillary     Status: Abnormal   Collection Time: 07/09/15  9:10 PM  Result Value Ref Range   Glucose-Capillary 115 (H) 65 - 99 mg/dL  Glucose, capillary     Status: None   Collection Time: 07/10/15  6:45 AM  Result Value Ref Range   Glucose-Capillary 91 65 - 99 mg/dL  Glucose, capillary     Status: None   Collection Time: 07/10/15 11:17 AM  Result Value Ref Range   Glucose-Capillary 96 65 - 99 mg/dL  Glucose, capillary     Status: None   Collection Time: 07/10/15  4:43 PM  Result Value Ref Range   Glucose-Capillary 99 65 - 99 mg/dL   Comment 1 Notify RN   Glucose, capillary     Status: None   Collection Time: 07/10/15  9:23 PM  Result Value Ref Range   Glucose-Capillary 95 65 - 99 mg/dL   Comment 1 Notify RN   Glucose, capillary     Status: None   Collection Time: 07/11/15  6:32 AM  Result Value Ref Range   Glucose-Capillary 86 65 - 99 mg/dL   Comment 1 Notify RN   CBC     Status: Abnormal   Collection Time: 07/11/15  8:05 AM  Result Value Ref Range   WBC 10.9 (H) 4.0 - 10.5  K/uL   RBC 3.13 (L) 3.87 - 5.11 MIL/uL   Hemoglobin 7.9 (L) 12.0 - 15.0 g/dL   HCT 26.6 (L) 36.0 - 46.0 %   MCV 85.0 78.0 - 100.0 fL   MCH 25.2 (L) 26.0 - 34.0 pg   MCHC 29.7 (L) 30.0 - 36.0 g/dL   RDW 22.7 (H) 11.5 - 15.5 %   Platelets 443 (H) 150 - 400 K/uL  Urinalysis, Routine w reflex microscopic (not at [facility])     Status: Abnormal   Collection Time: 07/11/15  8:56 AM  Result Value Ref Range   Color, Urine YELLOW YELLOW   APPearance HAZY (A) CLEAR   Specific Gravity, Urine 1.013 1.005 - 1.030   pH 5.5 5.0 - 8.0   Glucose, UA NEGATIVE NEGATIVE mg/dL   Hgb urine dipstick NEGATIVE NEGATIVE   Bilirubin Urine NEGATIVE NEGATIVE   Ketones, ur NEGATIVE NEGATIVE mg/dL   Protein, ur NEGATIVE NEGATIVE mg/dL   Urobilinogen, UA 1.0 0.0 - 1.0 mg/dL   Nitrite POSITIVE (A) NEGATIVE   Leukocytes, UA MODERATE (A) NEGATIVE  Urine microscopic-add on     Status: Abnormal   Collection Time: 07/11/15  8:56 AM  Result Value Ref Range   Squamous Epithelial / LPF FEW (A) RARE   WBC, UA 21-50 <3 WBC/hpf   RBC / HPF 0-2 <3 RBC/hpf   Bacteria, UA MANY (A) RARE   Casts HYALINE CASTS (A) NEGATIVE  Glucose, capillary     Status: None   Collection Time: 07/11/15 11:36 AM  Result Value Ref Range   Glucose-Capillary 87 65 - 99 mg/dL  Glucose, capillary     Status: None   Collection Time: 07/11/15  4:57 PM  Result Value Ref Range   Glucose-Capillary 80 65 - 99 mg/dL  Glucose, capillary     Status: None   Collection Time: 07/11/15  9:12 PM  Result Value Ref Range   Glucose-Capillary 97 65 - 99 mg/dL   Comment 1 Notify RN   Glucose, capillary     Status: None   Collection Time: 07/12/15  6:43 AM  Result Value Ref Range   Glucose-Capillary 89 65 - 99 mg/dL     HEENT: l Cardio: RRR and no murmur Resp: CTA B/L and unlabored GI: BS positive and LLQ colostomy, midline open abd wound with Wet to Dry ,  Extremity:  No Edema Skin:   Wound see above, no heel ulceration Neuro: Alert/Oriented and Abnormal  Motor 5/5 in BUE, 3- bilateral HF,able to lift heel off bed ~3 inches when supine 4- Knee ext and ankle DF/PF Musc/Skel:  Other no pain with UE and LE ROM Gen NAD   Assessment/Plan: 1. Functional deficits secondary to debilitation/Crohn's disease/perforated left colon with peritonitis/exploratory laparotomy with drainage of intra-abdominal abscess partial colon resection with colostomy which require 3+ hours per day of interdisciplinary therapy in a comprehensive inpatient rehab setting. Physiatrist is providing close team supervision and 24 hour management of active medical problems listed below. Physiatrist and rehab team continue to assess barriers to discharge/monitor patient progress toward functional and medical goals.  Team conference today please see physician documentation under team conference tab, met with team face-to-face to discuss problems,progress, and goals. Formulized individual treatment plan based on medical history, underlying problem and comorbidities. FIM: Function - Bathing Position: Other (comment) (Edge of bed for upper body and supine/HOB elevated for lower) Body parts bathed by patient: Right arm, Left arm, Chest, Abdomen, Right upper leg, Left upper leg Body parts bathed by helper: Left lower leg, Right lower leg, Front perineal area Bathing not applicable: Abdomen, Buttocks, Front perineal area (peri care done prior to OT session) Assist Level: Touching or steadying assistance(Pt > 75%)  Function- Upper Body Dressing/Undressing What is the patient wearing?: Hospital gown Bra - Perfomed by patient: Thread/unthread right bra strap, Thread/unthread left bra strap Bra - Perfomed by helper: Hook/unhook bra (pull down sports bra) Pull over shirt/dress - Perfomed by patient: Thread/unthread right sleeve, Thread/unthread left sleeve Pull over shirt/dress - Perfomed by helper: Pull shirt over trunk Assist Level: Touching or steadying assistance(Pt > 75%) Set up : To  obtain clothing/put away Function - Lower Body Dressing/Undressing What is the patient wearing?: Non-skid slipper socks Position: Bed Pants- Performed by patient: Pull pants up/down Pants- Performed by helper: Thread/unthread right pants leg, Thread/unthread left pants leg, Pull pants up/down Non-skid slipper socks- Performed by patient: Don/doff right sock, Don/doff left sock Non-skid slipper socks- Performed by helper: Don/doff right sock, Don/doff left sock Assist for lower body dressing: Touching or steadying assistance (Pt > 75%)  Function - Toileting Toileting activity did not occur: Safety/medical concerns Toileting steps completed by patient: Adjust clothing prior to toileting,  Performs perineal hygiene, Adjust clothing after toileting Toileting steps completed by helper: Adjust clothing prior to toileting, Performs perineal hygiene, Adjust clothing after toileting Assist level: Touching or steadying assistance (Pt.75%)  Function - Toilet Transfers Toilet transfer activity did not occur: Safety/medical concerns Toilet transfer assistive device: Mechanical lift Mechanical lift: [place] Assist level to toilet: 2 helpers Assist level from toilet: 2 helpers  Function - Chair/bed transfer Chair/bed transfer activity did not occur: Safety/medical concerns Chair/bed transfer method: Other Chair/bed transfer assist level: 2 helpers Chair/bed transfer assistive device: Mechanical lift Mechanical lift: [place] Chair/bed transfer details: Verbal cues for safe use of DME/AE, Manual facilitation for placement  Function - Locomotion: Wheelchair Will patient use wheelchair at discharge?: Yes Type: Manual Wheelchair activity did not occur: Safety/medical concerns (debility and awaiting appropriate equipment) Max wheelchair distance: 77' Assist Level: Touching or steadying assistance (Pt > 75%) Assist Level: Dependent (Pt equals 0%) Assist Level: Dependent (Pt equals 0%) Turns  around,maneuvers to table,bed, and toilet,negotiates 3% grade,maneuvers on rugs and over doorsills: No Function - Locomotion: Ambulation Ambulation activity did not occur: Safety/medical concerns (significant debility)  Function - Comprehension Comprehension: Auditory Comprehension assist level: Follows complex conversation/direction with no assist  Function - Expression Expression: Verbal Expression assist level: Expresses complex ideas: With no assist  Function - Social Interaction Social Interaction assist level: Interacts appropriately with others - No medications needed.  Function - Problem Solving Problem solving assist level: Solves complex problems: Recognizes & self-corrects  Function - Memory Memory assist level: Complete Independence: No helper Patient normally able to recall (first 3 days only): Current season, Location of own room, Staff names and faces, That he or she is in a hospital   Medical Problem List and Plan: 1. Functional deficits secondary to debilitation/Crohn's disease/perforated left colon with peritonitis/exploratory laparotomy with drainage of intra-abdominal abscess partial colon resection with colostomy. Restarted  Pentasa 500 mg 4 times a day on 10/15 was several days without it -nausea may be Crohn's related, better today, will monitor intake as well as colostomy output 2.  DVT Prophylaxis/Anticoagulation: Subcutaneous heparin. Monitor platelet counts, currently no signs of bleeding, cbc in am 3. Pain Management: Hydrocodone as needed 4. Acute blood loss anemia. Monitor while on heparin, hgb 8.0 on 10/10, recheck in am 5. Neuropsych: This patient is capable of making decisions on her own behalf. 6. Skin/Wound Care: dehiscence noted at muco cutaneous jct of stoma- CCS seeing pt on M-W-F and WOC RN following, s/p placement of wound vac,  7. Fluids/Electrolytes/Nutrition: Strict I&O with follow-up chemistries, 20-30% meals 8. Acute renal insufficiency  2.64-2.84. Creatinine latest result 1.09. Follow-up chemistries stable,  9. Acute respiratory failure/VDRF. Extubated 06/14/2015.propable obesity hypoventilation syndrome, cont PRN O2 10. SVT. Monitor heart rate. Lopressor 50 mg twice a day, pt also severely deconditioned with poor exercise tolerance, am HR 94, cont current dose 11. Chronic systolic congestive heart failure. Monitor for any signs of fluid overload. Lasix 40 mg twice a day, monitor intake and creat, no SOB, sats ok, no change in dose at present 12.  UTI ecoli- s/p treatment recheck UA looks like recurrent UTI afeb, await culture, specimen may be from colonized catheter, will discuss with RN 13. Morbid obesity. Body mass index 67.33 14.  Mild hypokalemia - will supplement  15.  Severe hypoalbuminemia- supplement , this is contributing to delayed wound healing , doesn't tolerate beneprotein but drinks ensure ~3 cans a day15.  Dry ganagrene R 5th digit due to microembolism , monitor for demarcation no surgical  intervention needed at this poin per [facility]- appreciate consult  LOS (Days) [place] E 07/12/2015, 9:47 AM

## 2015-07-12 NOTE — Progress Notes (Signed)
Pt. Refused cpap. 

## 2015-07-13 ENCOUNTER — Inpatient Hospital Stay (HOSPITAL_COMMUNITY): Payer: Medicaid Other | Admitting: Physical Therapy

## 2015-07-13 ENCOUNTER — Inpatient Hospital Stay (HOSPITAL_COMMUNITY): Payer: Medicaid Other

## 2015-07-13 DIAGNOSIS — F4323 Adjustment disorder with mixed anxiety and depressed mood: Secondary | ICD-10-CM

## 2015-07-13 DIAGNOSIS — G4733 Obstructive sleep apnea (adult) (pediatric): Secondary | ICD-10-CM

## 2015-07-13 DIAGNOSIS — I5022 Chronic systolic (congestive) heart failure: Secondary | ICD-10-CM

## 2015-07-13 DIAGNOSIS — R5381 Other malaise: Secondary | ICD-10-CM

## 2015-07-13 LAB — URINE CULTURE

## 2015-07-13 LAB — GLUCOSE, CAPILLARY
GLUCOSE-CAPILLARY: 76 mg/dL (ref 65–99)
Glucose-Capillary: 75 mg/dL (ref 65–99)
Glucose-Capillary: 87 mg/dL (ref 65–99)
Glucose-Capillary: 82 mg/dL (ref 65–99)

## 2015-07-13 NOTE — Telephone Encounter (Signed)
She has Roselee Culver and Margaretha Sheffield on her DPR, so it would be okay to contact them. Pt is currently admitted at [facility].

## 2015-07-13 NOTE — Progress Notes (Signed)
Pt continues to refuse CPAP °

## 2015-07-13 NOTE — Progress Notes (Signed)
Subjective/Complaints: Patient states she is doing well this morning. She states she slept well overnight. Her only concern today is regarding fingersticks. She would like to know if she can have her fingersticks DC'd.  ROS:  No CP or SOB, no increased limb swelling   Objective: Vital Signs: Blood pressure 86/51, pulse 113, temperature 98.1 F (36.7 C), temperature source Oral, resp. rate 18, weight 166.924 kg (368 lb), SpO2 100 %. No results found. Results for orders placed or performed during the hospital encounter of 06/26/15 (from the past 72 hour(s))  Glucose, capillary     Status: None   Collection Time: 07/10/15  4:43 PM  Result Value Ref Range   Glucose-Capillary 99 65 - 99 mg/dL   Comment 1 Notify RN   Glucose, capillary     Status: None   Collection Time: 07/10/15  9:23 PM  Result Value Ref Range   Glucose-Capillary 95 65 - 99 mg/dL   Comment 1 Notify RN   Glucose, capillary     Status: None   Collection Time: 07/11/15  6:32 AM  Result Value Ref Range   Glucose-Capillary 86 65 - 99 mg/dL   Comment 1 Notify RN   CBC     Status: Abnormal   Collection Time: 07/11/15  8:05 AM  Result Value Ref Range   WBC 10.9 (H) 4.0 - 10.5 K/uL   RBC 3.13 (L) 3.87 - 5.11 MIL/uL   Hemoglobin 7.9 (L) 12.0 - 15.0 g/dL   HCT 26.6 (L) 36.0 - 46.0 %   MCV 85.0 78.0 - 100.0 fL   MCH 25.2 (L) 26.0 - 34.0 pg   MCHC 29.7 (L) 30.0 - 36.0 g/dL   RDW 22.7 (H) 11.5 - 15.5 %   Platelets 443 (H) 150 - 400 K/uL  Urine culture     Status: None   Collection Time: 07/11/15  8:56 AM  Result Value Ref Range   Specimen Description URINE, CATHETERIZED    Special Requests NONE    Culture >=100,000 COLONIES/mL ESCHERICHIA COLI    Report Status 07/13/2015 FINAL    Organism ID, Bacteria ESCHERICHIA COLI       Susceptibility   Escherichia coli - MIC*    AMPICILLIN >=32 RESISTANT Resistant     CEFAZOLIN 8 SENSITIVE Sensitive     CEFTRIAXONE <=1 SENSITIVE Sensitive     CIPROFLOXACIN >=4 RESISTANT  Resistant     GENTAMICIN >=16 RESISTANT Resistant     IMIPENEM <=0.25 SENSITIVE Sensitive     NITROFURANTOIN 64 INTERMEDIATE Intermediate     TRIMETH/SULFA >=320 RESISTANT Resistant     AMPICILLIN/SULBACTAM >=32 RESISTANT Resistant     PIP/TAZO <=4 SENSITIVE Sensitive     * >=100,000 COLONIES/mL ESCHERICHIA COLI  Urinalysis, Routine w reflex microscopic (not at [facility])     Status: Abnormal   Collection Time: 07/11/15  8:56 AM  Result Value Ref Range   Color, Urine YELLOW YELLOW   APPearance HAZY (A) CLEAR   Specific Gravity, Urine 1.013 1.005 - 1.030   pH 5.5 5.0 - 8.0   Glucose, UA NEGATIVE NEGATIVE mg/dL   Hgb urine dipstick NEGATIVE NEGATIVE   Bilirubin Urine NEGATIVE NEGATIVE   Ketones, ur NEGATIVE NEGATIVE mg/dL   Protein, ur NEGATIVE NEGATIVE mg/dL   Urobilinogen, UA 1.0 0.0 - 1.0 mg/dL   Nitrite POSITIVE (A) NEGATIVE   Leukocytes, UA MODERATE (A) NEGATIVE  Urine microscopic-add on     Status: Abnormal   Collection Time: 07/11/15  8:56 AM  Result Value Ref Range  Squamous Epithelial / LPF FEW (A) RARE   WBC, UA 21-50 <3 WBC/hpf   RBC / HPF 0-2 <3 RBC/hpf   Bacteria, UA MANY (A) RARE   Casts HYALINE CASTS (A) NEGATIVE  Glucose, capillary     Status: None   Collection Time: 07/11/15 11:36 AM  Result Value Ref Range   Glucose-Capillary 87 65 - 99 mg/dL  Glucose, capillary     Status: None   Collection Time: 07/11/15  4:57 PM  Result Value Ref Range   Glucose-Capillary 80 65 - 99 mg/dL  Glucose, capillary     Status: None   Collection Time: 07/11/15  9:12 PM  Result Value Ref Range   Glucose-Capillary 97 65 - 99 mg/dL   Comment 1 Notify RN   Glucose, capillary     Status: None   Collection Time: 07/12/15  6:43 AM  Result Value Ref Range   Glucose-Capillary 89 65 - 99 mg/dL  Urinalysis, Routine w reflex microscopic (not at [facility])     Status: Abnormal   Collection Time: 07/12/15 10:05 AM  Result Value Ref Range   Color, Urine AMBER (A) YELLOW    Comment: BIOCHEMICALS  MAY BE AFFECTED BY COLOR   APPearance CLOUDY (A) CLEAR   Specific Gravity, Urine 1.018 1.005 - 1.030   pH 5.0 5.0 - 8.0   Glucose, UA NEGATIVE NEGATIVE mg/dL   Hgb urine dipstick LARGE (A) NEGATIVE   Bilirubin Urine SMALL (A) NEGATIVE   Ketones, ur 15 (A) NEGATIVE mg/dL   Protein, ur NEGATIVE NEGATIVE mg/dL   Urobilinogen, UA 1.0 0.0 - 1.0 mg/dL   Nitrite POSITIVE (A) NEGATIVE   Leukocytes, UA MODERATE (A) NEGATIVE  Urine microscopic-add on     Status: Abnormal   Collection Time: 07/12/15 10:05 AM  Result Value Ref Range   Squamous Epithelial / LPF FEW (A) RARE   WBC, UA 21-50 <3 WBC/hpf   RBC / HPF 0-2 <3 RBC/hpf   Bacteria, UA MANY (A) RARE   Casts HYALINE CASTS (A) NEGATIVE   Urine-Other AMORPHOUS URATES/PHOSPHATES   Urine culture     Status: None (Preliminary result)   Collection Time: 07/12/15 10:06 AM  Result Value Ref Range   Specimen Description URINE, CATHETERIZED    Special Requests NONE    Culture >=100,000 COLONIES/mL ESCHERICHIA COLI    Report Status PENDING   Glucose, capillary     Status: None   Collection Time: 07/12/15 11:29 AM  Result Value Ref Range   Glucose-Capillary 88 65 - 99 mg/dL   Comment 1 Notify RN   Basic metabolic panel     Status: Abnormal   Collection Time: 07/12/15  4:00 PM  Result Value Ref Range   Sodium 139 135 - 145 mmol/L   Potassium 3.9 3.5 - 5.1 mmol/L   Chloride 95 (L) 101 - 111 mmol/L   CO2 32 22 - 32 mmol/L   Glucose, Bld 97 65 - 99 mg/dL   BUN 19 6 - 20 mg/dL   Creatinine, Ser 1.27 (H) 0.44 - 1.00 mg/dL   Calcium 7.7 (L) 8.9 - 10.3 mg/dL   GFR calc non Af Amer 54 (L) >60 mL/min   GFR calc Af Amer >60 >60 mL/min    Comment: (NOTE) The eGFR has been calculated using the CKD EPI equation. This calculation has not been validated in all clinical situations. eGFR's persistently <60 mL/min signify possible Chronic Kidney Disease.    Anion gap 12 5 - 15  Glucose, capillary  Status: None   Collection Time: 07/12/15  4:30 PM   Result Value Ref Range   Glucose-Capillary 96 65 - 99 mg/dL   Comment 1 Notify RN   Glucose, capillary     Status: None   Collection Time: 07/12/15  8:44 PM  Result Value Ref Range   Glucose-Capillary 91 65 - 99 mg/dL   Comment 1 Notify RN   Glucose, capillary     Status: None   Collection Time: 07/13/15  6:09 AM  Result Value Ref Range   Glucose-Capillary 75 65 - 99 mg/dL  Glucose, capillary     Status: None   Collection Time: 07/13/15 11:19 AM  Result Value Ref Range   Glucose-Capillary 87 65 - 99 mg/dL   Comment 1 Notify RN      HEENT: l Cardio: RRR and no murmur Resp: CTA B/L and unlabored GI: BS positive and LLQ colostomy, midline open abd wound with Wet to Dry ,  Extremity:  No Edema Skin:   Wound see above, no heel ulceration Neuro: Alert/Oriented and Abnormal Motor 5/5 in BUE, 3- bilateral HF,able to lift heel off bed ~3 inches when supine 4- Knee ext and ankle DF/PF Musc/Skel:  Other no pain with UE and LE ROM Gen NAD   Assessment/Plan: 1. Functional deficits secondary to debilitation/Crohn's disease/perforated left colon with peritonitis/exploratory laparotomy with drainage of intra-abdominal abscess partial colon resection with colostomy which require 3+ hours per day of interdisciplinary therapy in a comprehensive inpatient rehab setting. Physiatrist is providing close team supervision and 24 hour management of active medical problems listed below. Physiatrist and rehab team continue to assess barriers to discharge/monitor patient progress toward functional and medical goals.  Team conference today please see physician documentation under team conference tab, met with team face-to-face to discuss problems,progress, and goals. Formulized individual treatment plan based on medical history, underlying problem and comorbidities. FIM: Function - Bathing Position: Other (comment) (Edge of bed for upper body and supine/HOB elevated for lower) Body parts bathed by  patient: Right arm, Left arm, Chest, Abdomen, Right upper leg, Left upper leg Body parts bathed by helper: Left lower leg, Right lower leg, Front perineal area Bathing not applicable: Abdomen, Buttocks, Front perineal area (peri care done prior to OT session) Assist Level: Touching or steadying assistance(Pt > 75%)  Function- Upper Body Dressing/Undressing What is the patient wearing?: Hospital gown Bra - Perfomed by patient: Thread/unthread right bra strap, Thread/unthread left bra strap Bra - Perfomed by helper: Hook/unhook bra (pull down sports bra) Pull over shirt/dress - Perfomed by patient: Thread/unthread right sleeve, Thread/unthread left sleeve Pull over shirt/dress - Perfomed by helper: Pull shirt over trunk Assist Level: Touching or steadying assistance(Pt > 75%) Set up : To obtain clothing/put away Function - Lower Body Dressing/Undressing What is the patient wearing?: Non-skid slipper socks Position: Bed Pants- Performed by patient: Pull pants up/down Pants- Performed by helper: Thread/unthread right pants leg, Thread/unthread left pants leg, Pull pants up/down Non-skid slipper socks- Performed by patient: Don/doff right sock, Don/doff left sock Non-skid slipper socks- Performed by helper: Don/doff right sock, Don/doff left sock Assist for lower body dressing: Touching or steadying assistance (Pt > 75%)  Function - Toileting Toileting activity did not occur: Safety/medical concerns Toileting steps completed by patient: Adjust clothing prior to toileting, Performs perineal hygiene, Adjust clothing after toileting Toileting steps completed by helper: Adjust clothing prior to toileting, Performs perineal hygiene, Adjust clothing after toileting Assist level: Touching or steadying assistance (Pt.75%)  Function - Toilet  Transfers Toilet transfer activity did not occur: Safety/medical concerns Toilet transfer assistive device: Mechanical lift Mechanical lift: Maximove Assist  level to toilet: 2 helpers Assist level from toilet: 2 helpers  Function - Chair/bed transfer Chair/bed transfer activity did not occur: Safety/medical concerns Chair/bed transfer method: Lateral scoot Chair/bed transfer assist level: 2 helpers Chair/bed transfer assistive device: Sliding board, Bedrails Mechanical lift: [place] Chair/bed transfer details: Manual facilitation for placement, Manual facilitation for weight shifting, Verbal cues for safe use of DME/AE, Verbal cues for sequencing, Verbal cues for technique  Function - Locomotion: Wheelchair Will patient use wheelchair at discharge?: Yes Type: Manual Wheelchair activity did not occur: Safety/medical concerns (debility and awaiting appropriate equipment) Max wheelchair distance: 83' + 50' Assist Level: Moderate assistance (Pt 50 - 74%) (min A in straight line; mod A to turn) Assist Level: Moderate assistance (Pt 50 - 74%) Assist Level: Dependent (Pt equals 0%) Turns around,maneuvers to table,bed, and toilet,negotiates 3% grade,maneuvers on rugs and over doorsills: No Function - Locomotion: Ambulation Ambulation activity did not occur: Safety/medical concerns (significant debility)  Function - Comprehension Comprehension: Auditory Comprehension assist level: Understands complex 90% of the time/cues 10% of the time  Function - Expression Expression: Verbal Expression assist level: Expresses complex ideas: With no assist  Function - Social Interaction Social Interaction assist level: Interacts appropriately with others - No medications needed.  Function - Problem Solving Problem solving assist level: Solves complex problems: Recognizes & self-corrects  Function - Memory Memory assist level: Recognizes or recalls 90% of the time/requires cueing < 10% of the time Patient normally able to recall (first 3 days only): Current season, Location of own room, Staff names and faces, That he or she is in a hospital   Medical  Problem List and Plan: 1. Functional deficits secondary to debilitation/Crohn's disease/perforated left colon with peritonitis/exploratory laparotomy with drainage of intra-abdominal abscess partial colon resection with colostomy. Restarted  Pentasa 500 mg 4 times a day on 10/15 was several days without it -nausea may be Crohn's related, better today, will monitor intake as well as colostomy output 2.  DVT Prophylaxis/Anticoagulation: Subcutaneous heparin. Monitor platelet counts, currently no signs of bleeding, cbc in am 3. Pain Management: Hydrocodone as needed 4. Acute blood loss anemia. Monitor while on heparin, hgb 7.9 on 10/18 5. Neuropsych: This patient is capable of making decisions on her own behalf. 6. Skin/Wound Care: dehiscence noted at muco cutaneous jct of stoma- CCS seeing pt on M-W-F and WOC RN following, s/p placement of wound vac,  7. Fluids/Electrolytes/Nutrition: Strict I&O with follow-up chemistries 8. Acute renal insufficiency ; improving, 1.27 on 10/19 . Will continue to monitor periodically.  9. Acute respiratory failure/VDRF. Extubated 06/14/2015.propable obesity hypoventilation syndrome, cont PRN O2 10. SVT. Monitor heart rate. Lopressor 50 mg twice a day, pt also severely deconditioned with poor exercise tolerance, am HR 104, cont current dose 11. Chronic systolic congestive heart failure. Monitor for any signs of fluid overload. Lasix 40 mg twice a day, monitor intake and creat, no SOB, sats ok, no change in dose at present 12.  UTI ecoli- s/p treatment recheck UA looks like recurrent UTI afeb, await culture, specimen may be from colonized catheter, will discuss with RN 13. Morbid obesity. Body mass index 67.33 14.  Mild hypokalemia - will supplement  15.  Severe hypoalbuminemia- supplement , this is contributing to delayed wound healing , doesn't tolerate beneprotein but drinks ensure ~3 cans a day15.  Dry ganagrene R 5th digit due to microembolism , monitor for  demarcation no surgical intervention needed at this poin per [facility]- appreciate consult  LOS (Days) 17 A FACE TO FACE EVALUATION WAS PERFORMED   Lorie Phenix 07/13/2015, 12:48 PM

## 2015-07-13 NOTE — Progress Notes (Signed)
Wound Vac canister changed this AM. Canister last changed, 07/09/15 at 0600-467m out since last changed. Slept good during the night. Burped ostomy bag, min. Amount of liquid stool. 2 PRN norco given for ABD. Pain. MPatrici RanksA

## 2015-07-13 NOTE — Progress Notes (Signed)
Occupational Therapy Session Note  Patient Details  Name: Dominique [name] MRN: [id number] Date of Birth: 07-Jun-1981  Today's Date: 07/13/2015 OT Individual Time: 0830-1000 OT Individual Time Calculation (min): 90 min    Short Term Goals: Week 3:  OT Short Term Goal 1 (Week 3): Pt will complete slide board transfer, w/c <> raised mat, with +2 assist only (no mechanical lift assist support required) OT Short Term Goal 2 (Week 3): Pt will demo ability to direct caregiver(s) with transfer sequence using mechanical lift with min questioning cues OT Short Term Goal 3 (Week 3): Pt will tolerate supported upright sitting posture for 2 consecutive hours with total time in position for at least 4 hours daily. OT Short Term Goal 4 (Week 3): Pt will complete bed mobiity using DME/AE as needed with mod assist  Skilled Therapeutic Interventions/Progress Updates: Therapeutic exercise (30 min) with focus on improved strength to facilitate trunk rotation during bed mobility.   Pt completed assisted hip/knee flexion, knee extension, hip ab/adduction using manual resistance from therapist through all planes of motion.   Pt performs 10 repetitions of each exercise, right leg then left, with frequent rest breaks in order to promote maximum exertion during muscle contraction.   Pt maintains good effort throughout task.    ADL retraining (60 min) with focus on pt ed relating to importance of developing her competence with providing direction to caregivers, sequencing of transfers, and slide board transfer re-training.  Pt requires +2 helper throughout task but is able to recall 4 steps during slide board transfer with min instructional cues.   Pt completes slide board transfer from EOB to w/c with +2 assist, pt = 30%, with extra time and mod vc to lean forward, shift weight to trailing LE and pull with her right arm.   Pt left with additional OT helper at end of session to finish final transfer from w/c to shuttle chair using  Maxi-Move mech lift.     Therapy Documentation Precautions:  Precautions Precautions: Fall Precaution Comments: abdominal wound vac, colostomy Required Braces or Orthoses: Other Brace/Splint Other Brace/Splint: abdominal binder when OOB Restrictions Weight Bearing Restrictions: No  Vital Signs: Therapy Vitals Pulse Rate: (!) 113 BP: (!) 86/51 mmHg   Pain: Pain Assessment Pain Assessment: 0-10 Pain Score: 6  Pain Type: Acute pain;Surgical pain Pain Location: Abdomen Pain Orientation: Anterior;Mid Pain Descriptors / Indicators: Spasm Pain Onset: Sudden Pain Intervention(s): Rest Multiple Pain Sites: No  ADL: ADL ADL Comments: see Functional Assessment  See Function Navigator for Current Functional Status.   Therapy/Group: Individual Therapy  [place] 07/13/2015, 12:33 PM

## 2015-07-13 NOTE — Progress Notes (Signed)
Silvestre Mesi PAC was notified about pt's BP 82/41.Keep assessing pt. Closely.

## 2015-07-13 NOTE — Progress Notes (Signed)
Physical Therapy Session Note  Patient Details  Name: Dominique [name] MRN: [id number] Date of Birth: 04/04/1981  Today's Date: 07/13/2015 PT Individual Time: 1130-1210 Treatment Session 2: 1415-1540 PT Individual Time Calculation (min): 40 min Treatment Session 2: 85 min  Short Term Goals: Week 3:  PT Short Term Goal 1 (Week 3): Pt will demonstrate dynamic sitting balance with min A for slideboards and functional activities EOB and edge of mat x 10-15 min PT Short Term Goal 2 (Week 3): Pt will perform sit <> stand from elevated mat with appropriate lift equipment and max A and maintain standing 1-2 minutes at a time PT Short Term Goal 3 (Week 3): Pt will perform slideboard w/c <> bed with consistent mod A PT Short Term Goal 4 (Week 3): Pt will perform bed mobility with consistent min A and use of bed controls PT Short Term Goal 5 (Week 3): Pt will perform w/c mobility x 150' in controlled environment with supervision  Skilled Therapeutic Interventions/Progress Updates:    Treatment Session 1: Pt received up in shuttlle chair - agreeable to oob. Therapeutic Activity - see function tab for details. Pt transferred back to bed, then +2 assist for stabilization of feet while pt attempts bridges to scoot laterally. Bed placed in reverse [place] for weightbearing exercises. Therapeutic Exercises - Mini squats, heel raises, marching x 30 reps each. PT uses bed function in [place] and stabilizes pt's feet while verbally cueing pt to use arms on bedrails/headboard & legs to scoot up in bed. PT placed pillows laterally to support each breast and brought tray in front of pt, cueing pt to eat lunch once it arrives. Pt very fatigued this AM session, but participates with encouragement.   Treatment Session 2: Pt received in bed, agreeable to PT session. Therapeutic Exercise - While pt awaiting RN to change her ostomy bag (leaking), PT gives pt handout of HEP that she can do on her own when in bed and  she demonstrates back understanding of a few of them: ankle circles, isometric glute contractions for 5 second holds, heel slides (anti-gravity or gravity minimized pending pt's ability level), and supine hip abduction/adduction: x 5-10 reps each leg. Therapeutic Activity - Pt instructed in rolling R/L in flat bed with bedrail and sheet rope multiple times to place abdominal binder and place draw sheet under pt's buttocks to facilitate ease of scooting for slideboard transfer, req mod A. HOB elevated and pt req +2 max A for supine to sit transfer with verbal cues for sequencing/technique. Slideboard placed and pt req significant amount of increased time, as well as manual facilitation at back to lean forward and for head-hips direction during scoot: PT seated in front of pt, OT clinical specialist behind pt facilitating bottom lift, and PT Tech beside pt stabilizing w/c and slideboard for safety. Draw sheet did not fully cover pt's posterior thighs and pt's fatigue and increase in abdominal spasms made it difficulty for pt to achieve good scoots. RN assists manually at draw sheet for last 50% of scoot into w/c. Pt scooted back for good positioning in w/c and with all needs in reach prior to PT and Tech leaving pt. Pt very fatigued, fearful, and with significant abdominal spasms during today's session, which limits her ability to participate in slideboard transfer and bed mobility. In the future, will practice transferring on/off mat with slideboard from manual w/c to reduce effort for pt. RN agrees to [employer] pt back to bed after 20 minutes up in manual w/c -  maxi sling pre-placed in w/c prior to slideboard transfer. Continue per PT POC.    Therapy Documentation Precautions:  Precautions Precautions: Fall Precaution Comments: abdominal wound vac, colostomy Required Braces or Orthoses: Other Brace/Splint Other Brace/Splint: abdominal binder when OOB Restrictions Weight Bearing Restrictions:  No Pain: Pain Assessment Pain Assessment: 0-10 Pain Score: 6  Pain Type: Acute pain;Surgical pain Pain Location: Abdomen Pain Orientation: Anterior;Mid Pain Descriptors / Indicators: Spasm Pain Onset: Sudden Pain Intervention(s): Rest Multiple Pain Sites: No Treatment Session 2: Pt reports 7/10 pain in abdomen when spasms occur - abdominal binder removed and pt instructed in deep breathing and given a rest break to help decrease her pain.    See Function Navigator for Current Functional Status.   Therapy/Group: Individual Therapy in AM and with PT Tech and OT clinical specialist as +2 and for mentoring (respectively) in PM  [facility] M 07/13/2015, 12:21 PM

## 2015-07-14 ENCOUNTER — Inpatient Hospital Stay (HOSPITAL_COMMUNITY): Payer: Medicaid Other | Admitting: Physical Therapy

## 2015-07-14 ENCOUNTER — Inpatient Hospital Stay (HOSPITAL_COMMUNITY): Payer: Medicaid Other | Admitting: Occupational Therapy

## 2015-07-14 LAB — GLUCOSE, CAPILLARY
GLUCOSE-CAPILLARY: 93 mg/dL (ref 65–99)
Glucose-Capillary: 70 mg/dL (ref 65–99)
Glucose-Capillary: 96 mg/dL (ref 65–99)
Glucose-Capillary: 91 mg/dL (ref 65–99)

## 2015-07-14 LAB — URINE CULTURE

## 2015-07-14 MED ORDER — CEPHALEXIN 250 MG PO CAPS
250.0000 mg | ORAL_CAPSULE | Freq: Three times a day (TID) | ORAL | Status: AC
  Administered 2015-07-14 – 2015-07-18 (×11): 250 mg via ORAL
  Filled 2015-07-14 (×12): qty 1

## 2015-07-14 NOTE — Progress Notes (Signed)
Social Work Patient ID: Dominique [name], female   DOB: [birth year], 34 y.o.   MRN: [id number]   CSW met with pt today to update her on team conference discussion.  CSW found pt asleep several times when she was not in therapy over the last two days.  When CSW finally spoke with pt today, she was very defeated in her talk and flat, seeming very depressed/low on energy and wanting to give up.  She is not seeing the progress and knows she cannot go home from CIR, so she doesn't see any point in continuing with therapy.  CSW and PT, Leafy Ro, encouraged pt to work with PT, as this is the only way she can break the cycle she kept talking about she was in.  CSW offered support and listened to pt.  CSW did confirm to pt that she would need another step before she is able to return home and told her that CSW would work on this with her, making sure she has a plan to continue her therapy to get to her ultimate goal of getting home.  CSW will work on this next step and continue to support pt.

## 2015-07-14 NOTE — Patient Care Conference (Signed)
Inpatient RehabilitationTeam Conference and Plan of Care Update Date: 07/12/2015   Time: 2:05 PM    Patient Name: Dominique [name]      Medical Record Number: [id number]  Date of Birth: 09-Mar-1981 Sex: Female         Room/Bed: 4M10C/4M10C-01 Payor Info: Payor: MEDICAID [place] / Plan: MEDICAID  ACCESS / Product Type: *No Product type* /    Admitting Diagnosis: DEBILITY  Admit Date/Time:  06/26/2015  5:49 PM Admission Comments: No comment available   Primary Diagnosis:  Debilitated Principal Problem: Debilitated  Patient Active Problem List   Diagnosis Date Noted  . Adjustment disorder with mixed anxiety and depressed mood   . Debilitated 06/26/2015  . Acute renal failure syndrome ([place])   . Cardiomyopathy- etiology not yet determined 06/20/2015  . Chronic systolic heart failure ([place])   . Pulmonary hypertension ([place])   . Chronic systolic CHF (congestive heart failure) ([place])   . Acute respiratory failure with hypoxia ([place])   . Crohn's disease ([place])   . Blood poisoning ([place])   . Perforated sigmoid colon ([place])   . Peritonitis ([place])   . Peritoneal abscess ([place])   . OSA on CPAP   . Obesity hypoventilation syndrome ([place])   . Acute renal insufficiency   . Acute encephalopathy   . Pressure ulcer 06/16/2015  . Protein-calorie malnutrition, severe ([place]) 06/10/2015  . Colitis 06/04/2015  . Generalized abdominal pain   . Gastritis 04/07/2015  . Iron deficiency anemia 04/07/2015  . Sinus tachycardia ([place]) 12/29/2014  . Lumbar back pain 12/06/2014  . Fluid retention 12/01/2014  . Abdominal pain   . Fall 11/20/2014  . Rectal bleeding 11/19/2014  . Diverticulitis of colon 11/17/2014  . Hepatic steatosis 11/17/2014  . Elevated LFTs 11/17/2014  . Seasonal allergies 06/13/2014  . OSA (obstructive sleep apnea) 05/02/2014  . Thyromegaly 05/02/2014  . AMENORRHEA 02/07/2010  . Morbid obesity- BMI 67.5 03/22/2009    Expected Discharge Date: Expected Discharge Date: 07/20/15  Team  Members Present: Physician leading conference: Dr. Delice Lesch Social Worker Present: Alfonse Alpers, LCSW Nurse Present: Dorien Chihuahua, RN PT Present: [place] Shi, PT OT Present: Willeen Cass, OT PPS Coordinator present : Daiva Nakayama, RN, CRRN     Current Status/Progress Goal Weekly Team Focus  Medical   Wound care with possible UTI, colostomy  Wound care, assess for UTI, colostomy care  See above   Bowel/Bladder   pt has a foley catheter in d/t urinary retention  Remove foley and be able to void without catherization      Swallow/Nutrition/ Hydration             ADL's   Max A for bed mobility, mech lift for transfers, overall max assist for bathing  Goals downgraded to overall mod assist for BADL, w/c homemaking, mod I for dynamic sitting balance  Improved awareness, pain manamgent, BUE strengthening, out of bed activity tolerance, transfers, bed mobility.   Mobility   max A bed mobility, maxi move for transfers, w/c propulsion supervision, standing in tilt table to 70 deg  Min-mod w/c level, gait and stairs D/C due to slow progress  activity tolerance, LE strength in WB, sit <> stand, transfers without lift equipment   Communication             Safety/Cognition/ Behavioral Observations            Pain   Asks for pain medicine q4-6h, relief is received once medicine is taken, pain reported 5-7  <5  Increased  pain relief   Skin   Patient has MSAD & splitting skin between breasts, currently have interdry for wicking under breast, has ABD wound to NPWV and colostomy to LUQ in place  Improve MSAD, decrease moisture, no skin breakdown between breasts  No skin breakdown    Rehab Goals Patient on target to meet rehab goals: No Rehab Goals Revised: Pt is making very slow progress and goals are being downgraded. *See Care Plan and progress notes for long and short-term goals.  Barriers to Discharge: wounds, colostomy, possible UTI    Possible Resolutions to Barriers:  Surgery  evaluating colostomy, cont wound care, treat UTI if necessary    Discharge Planning/Teaching Needs:  Pt will need to go to another rehab facility ([facility] vs. SNF) prior to going home.  She has 8-9 stairs to get to her apartment and she will not be able to do that after CIR stay.  Pt continues to have low blood pressures with tilt table and pain/spasms in abdomen.  TBD at the next venue   Team Discussion:  Pt may have a UTI and RN pulled foley cath to get a clean catch for a UA.  Pt is doing better with transfers and using the slide board, but still requires +3 and it takes a lot of time and the maxi move.  Pt's abdominal spasms are causing them to stop therapies.  PT is working with the pt on tilt table at 70 degrees and she is orthostatic, but pt really needs to be upright. Nursing to work on an out of bed schedule so she is up more even without therapists.  Team questioned and discussed where pt should go from [facility] vs. SNF.  CSW will begin to work on this for pt.  Revisions to Treatment Plan:  none   Continued Need for Acute Rehabilitation Level of Care: The patient requires daily medical management by a physician with specialized training in physical medicine and rehabilitation for the following conditions: Daily direction of a multidisciplinary physical rehabilitation program to ensure safe treatment while eliciting the highest outcome that is of practical value to the patient.: Yes Daily medical management of patient stability for increased activity during participation in an intensive rehabilitation regime.: Yes Daily analysis of laboratory values and/or radiology reports with any subsequent need for medication adjustment of medical intervention for : Post surgical problems;Other  Helia Haese, Silvestre Mesi 07/14/2015, 10:35 PM

## 2015-07-14 NOTE — Progress Notes (Signed)
Physical Therapy Session Note  Patient Details  Name: Dominique [name] MRN: [id number] Date of Birth: 1980-11-22  Today's Date: 07/14/2015 PT Individual Time: 1030-1100 Treatment Session 2: 1300-1415 PT Individual Time Calculation (min): 30 min Treatment Session 2: 75 min   Short Term Goals: Week 3:  PT Short Term Goal 1 (Week 3): Pt will demonstrate dynamic sitting balance with min A for slideboards and functional activities EOB and edge of mat x 10-15 min PT Short Term Goal 2 (Week 3): Pt will perform sit <> stand from elevated mat with appropriate lift equipment and max A and maintain standing 1-2 minutes at a time PT Short Term Goal 3 (Week 3): Pt will perform slideboard w/c <> bed with consistent mod A PT Short Term Goal 4 (Week 3): Pt will perform bed mobility with consistent min A and use of bed controls PT Short Term Goal 5 (Week 3): Pt will perform w/c mobility x 150' in controlled environment with supervision  Skilled Therapeutic Interventions/Progress Updates:    Treatment Session 1: Pt received in bed - verbalizing depression and wanting to take a day off from therapy. PT explains to pt that she can alter tx so pt does bed mobility and exercises, but that pt must not give up. PT points out all the ways that pt has made progress. Therapeutic Activity - PT instructs pt in rolling R/L in flat bed with sheet rope and bedrail req mod A overall - assist at breast on L side and assist for leg placement. Pt is improving in ability to use sheet rope to walk hands over to bedrail. Pt demonstrates mod I ability to use bed controls and rails to scoot body all the way up in bed. Therapeutic Exercise - PT instructs pt in B LE strengthening exercises: clam shells in side lie, AAROM side lie hip abduction, supine hip IR in B hook lie AAROM: 3 x 10 reps each. Pt ended in bed with all needs in reach.   Treatment Session 2: Pt received in bed - agreeable to PT with encouragement. Therapeutic Activity -  Maxi move transfer bed to manual w/c, manual w/c to tilt table, tilt table to manual w/c, and manual w/c to shuttle chair - pt able to direct 50-75% of transfer - pt woozy from having an anti-anxiety medication. W/C Management - See function tab for details - pt req min A with turn 90 degrees and verbal cues for steering when going straight : 55' total. Therapeutic Exercise - Tilt Table elevated to 20 degrees to ease pt's anxiety, explaining this is the same level as her bed will do in [place]: mini squats, heel raises, and marches x 10 reps each. Pt tolerates tilt table x 10 minutes, but BP drops to 98/53. Pt ended up in shuttle chair with all needs in reach and friend visiting. Nursing asked to put pt back to bed in 60 minutes. Continue per PT POC.    Therapy Documentation Precautions:  Precautions Precautions: Fall Precaution Comments: abdominal wound vac, colostomy Required Braces or Orthoses: Other Brace/Splint Other Brace/Splint: abdominal binder when OOB Restrictions Weight Bearing Restrictions: No Pain: Pain Assessment Pain Assessment: 0-10 Pain Score: 7  Faces Pain Scale: Hurts little more Pain Type: Acute pain Pain Location: Abdomen Pain Orientation: Mid Pain Frequency: Constant Pain Onset: On-going Patients Stated Pain Goal: 2 Pain Intervention(s): Medication (See eMAR) Treatment Session 2: Pt reports 6/10 pain in abdomen with spasms - PT uses rest breaks to decrease pt's pain.  See Function Navigator for Current Functional Status.   Therapy/Group: Individual Therapy  Tobby Fawcett M 07/14/2015, 10:53 AM

## 2015-07-14 NOTE — Progress Notes (Signed)
Occupational Therapy Session Note  Patient Details  Name: Dominique [name] MRN: [id number] Date of Birth: February 28, 1981  Today's Date: 07/14/2015 OT Individual Time: 0830-1010 OT Individual Time Calculation (min): 100 min   Short Term Goals: Week 3:  OT Short Term Goal 1 (Week 3): Pt will complete slide board transfer, w/c <> raised mat, with +2 assist only (no mechanical lift assist support required) OT Short Term Goal 2 (Week 3): Pt will demo ability to direct caregiver(s) with transfer sequence using mechanical lift with min questioning cues OT Short Term Goal 3 (Week 3): Pt will tolerate supported upright sitting posture for 2 consecutive hours with total time in position for at least 4 hours daily. OT Short Term Goal 4 (Week 3): Pt will complete bed mobiity using DME/AE as needed with mod assist  Skilled Therapeutic Interventions/Progress Updates: Therapeutic activity with focus on improved direction of caregivers, UE strengthening using theraband, and emotional support to remotivate for treatment.   Pt received by primary therapist in darkened room, supine in bed.   Pt very tearful, endorsing symptoms of depression and despair provoked by visit from her daughter previous night.  Pt is aware of limited progress in treatment and requests "day off" to rest however engages in treatment with added encouragement from +2 helper.   Pt receptive for outdoor activity to improve affect and engages training on learning sequence to transfers using DME and adjustment of positioning while seated in shuttle chair.   Pt demo's ability to use DME with mod instructional cues and supervision to problem-solve.  Pt participates in brief tour of outdoor lobby area and entrance area to gain insight on facility function and supports.   Pt returns to her room and completes BUE exercise using theraband.   Pt requests return to bed and directs caregiver, primary therapist in placement of Maxi-Move and adjustment of hospital bed  (approx 50% of sequence).  Pt left in bed at end of session with all needs within reach.      Therapy Documentation Precautions:  Precautions Precautions: Fall Precaution Comments: abdominal wound vac, colostomy Required Braces or Orthoses: Other Brace/Splint Other Brace/Splint: abdominal binder when OOB Restrictions Weight Bearing Restrictions: No  Pain: Recurrent spasms at abdomin throughout session; rest, reposition, distraction.    ADL: ADL ADL Comments: see Functional Assessment  See Function Navigator for Current Functional Status.   Therapy/Group: Individual Therapy  [place] 07/14/2015, 12:27 PM

## 2015-07-14 NOTE — Progress Notes (Signed)
Subjective/Complaints: Patient states she slept well overnight. She complains of abdominal pain this morning. Her urine culture also resulted positive for Escherichia coli. She also states that she is not interested in eating, however discussed her the importance of maintaining proper nutrition for overall health as well as wound healing.  ROS:  No CP or SOB, no increased limb swelling   Objective: Vital Signs: Blood pressure 110/53, pulse 112, temperature 97.7 F (36.5 C), temperature source Oral, resp. rate 18, weight 166.017 kg (366 lb), SpO2 98 %. No results found. Results for orders placed or performed during the hospital encounter of 06/26/15 (from the past 72 hour(s))  Urine culture     Status: None   Collection Time: 07/11/15  8:56 AM  Result Value Ref Range   Specimen Description URINE, CATHETERIZED    Special Requests NONE    Culture >=100,000 COLONIES/mL ESCHERICHIA COLI    Report Status 07/13/2015 FINAL    Organism ID, Bacteria ESCHERICHIA COLI       Susceptibility   Escherichia coli - MIC*    AMPICILLIN >=32 RESISTANT Resistant     CEFAZOLIN 8 SENSITIVE Sensitive     CEFTRIAXONE <=1 SENSITIVE Sensitive     CIPROFLOXACIN >=4 RESISTANT Resistant     GENTAMICIN >=16 RESISTANT Resistant     IMIPENEM <=0.25 SENSITIVE Sensitive     NITROFURANTOIN 64 INTERMEDIATE Intermediate     TRIMETH/SULFA >=320 RESISTANT Resistant     AMPICILLIN/SULBACTAM >=32 RESISTANT Resistant     PIP/TAZO <=4 SENSITIVE Sensitive     * >=100,000 COLONIES/mL ESCHERICHIA COLI  Urinalysis, Routine w reflex microscopic (not at [facility])     Status: Abnormal   Collection Time: 07/11/15  8:56 AM  Result Value Ref Range   Color, Urine YELLOW YELLOW   APPearance HAZY (A) CLEAR   Specific Gravity, Urine 1.013 1.005 - 1.030   pH 5.5 5.0 - 8.0   Glucose, UA NEGATIVE NEGATIVE mg/dL   Hgb urine dipstick NEGATIVE NEGATIVE   Bilirubin Urine NEGATIVE NEGATIVE   Ketones, ur NEGATIVE NEGATIVE mg/dL   Protein, ur  NEGATIVE NEGATIVE mg/dL   Urobilinogen, UA 1.0 0.0 - 1.0 mg/dL   Nitrite POSITIVE (A) NEGATIVE   Leukocytes, UA MODERATE (A) NEGATIVE  Urine microscopic-add on     Status: Abnormal   Collection Time: 07/11/15  8:56 AM  Result Value Ref Range   Squamous Epithelial / LPF FEW (A) RARE   WBC, UA 21-50 <3 WBC/hpf   RBC / HPF 0-2 <3 RBC/hpf   Bacteria, UA MANY (A) RARE   Casts HYALINE CASTS (A) NEGATIVE  Glucose, capillary     Status: None   Collection Time: 07/11/15 11:36 AM  Result Value Ref Range   Glucose-Capillary 87 65 - 99 mg/dL  Glucose, capillary     Status: None   Collection Time: 07/11/15  4:57 PM  Result Value Ref Range   Glucose-Capillary 80 65 - 99 mg/dL  Glucose, capillary     Status: None   Collection Time: 07/11/15  9:12 PM  Result Value Ref Range   Glucose-Capillary 97 65 - 99 mg/dL   Comment 1 Notify RN   Glucose, capillary     Status: None   Collection Time: 07/12/15  6:43 AM  Result Value Ref Range   Glucose-Capillary 89 65 - 99 mg/dL  Urinalysis, Routine w reflex microscopic (not at [facility])     Status: Abnormal   Collection Time: 07/12/15 10:05 AM  Result Value Ref Range   Color, Urine AMBER (  A) YELLOW    Comment: BIOCHEMICALS MAY BE AFFECTED BY COLOR   APPearance CLOUDY (A) CLEAR   Specific Gravity, Urine 1.018 1.005 - 1.030   pH 5.0 5.0 - 8.0   Glucose, UA NEGATIVE NEGATIVE mg/dL   Hgb urine dipstick LARGE (A) NEGATIVE   Bilirubin Urine SMALL (A) NEGATIVE   Ketones, ur 15 (A) NEGATIVE mg/dL   Protein, ur NEGATIVE NEGATIVE mg/dL   Urobilinogen, UA 1.0 0.0 - 1.0 mg/dL   Nitrite POSITIVE (A) NEGATIVE   Leukocytes, UA MODERATE (A) NEGATIVE  Urine microscopic-add on     Status: Abnormal   Collection Time: 07/12/15 10:05 AM  Result Value Ref Range   Squamous Epithelial / LPF FEW (A) RARE   WBC, UA 21-50 <3 WBC/hpf   RBC / HPF 0-2 <3 RBC/hpf   Bacteria, UA MANY (A) RARE   Casts HYALINE CASTS (A) NEGATIVE   Urine-Other AMORPHOUS URATES/PHOSPHATES   Urine  culture     Status: None   Collection Time: 07/12/15 10:06 AM  Result Value Ref Range   Specimen Description URINE, CATHETERIZED    Special Requests NONE    Culture >=100,000 COLONIES/mL ESCHERICHIA COLI    Report Status 07/14/2015 FINAL    Organism ID, Bacteria ESCHERICHIA COLI       Susceptibility   Escherichia coli - MIC*    AMPICILLIN >=32 RESISTANT Resistant     CEFAZOLIN 8 SENSITIVE Sensitive     CEFTRIAXONE <=1 SENSITIVE Sensitive     CIPROFLOXACIN >=4 RESISTANT Resistant     GENTAMICIN >=16 RESISTANT Resistant     IMIPENEM <=0.25 SENSITIVE Sensitive     NITROFURANTOIN 64 INTERMEDIATE Intermediate     TRIMETH/SULFA >=320 RESISTANT Resistant     AMPICILLIN/SULBACTAM >=32 RESISTANT Resistant     PIP/TAZO <=4 SENSITIVE Sensitive     * >=100,000 COLONIES/mL ESCHERICHIA COLI  Glucose, capillary     Status: None   Collection Time: 07/12/15 11:29 AM  Result Value Ref Range   Glucose-Capillary 88 65 - 99 mg/dL   Comment 1 Notify RN   Basic metabolic panel     Status: Abnormal   Collection Time: 07/12/15  4:00 PM  Result Value Ref Range   Sodium 139 135 - 145 mmol/L   Potassium 3.9 3.5 - 5.1 mmol/L   Chloride 95 (L) 101 - 111 mmol/L   CO2 32 22 - 32 mmol/L   Glucose, Bld 97 65 - 99 mg/dL   BUN 19 6 - 20 mg/dL   Creatinine, Ser 1.27 (H) 0.44 - 1.00 mg/dL   Calcium 7.7 (L) 8.9 - 10.3 mg/dL   GFR calc non Af Amer 54 (L) >60 mL/min   GFR calc Af Amer >60 >60 mL/min    Comment: (NOTE) The eGFR has been calculated using the CKD EPI equation. This calculation has not been validated in all clinical situations. eGFR's persistently <60 mL/min signify possible Chronic Kidney Disease.    Anion gap 12 5 - 15  Glucose, capillary     Status: None   Collection Time: 07/12/15  4:30 PM  Result Value Ref Range   Glucose-Capillary 96 65 - 99 mg/dL   Comment 1 Notify RN   Glucose, capillary     Status: None   Collection Time: 07/12/15  8:44 PM  Result Value Ref Range    Glucose-Capillary 91 65 - 99 mg/dL   Comment 1 Notify RN   Glucose, capillary     Status: None   Collection Time: 07/13/15  6:09 AM  Result Value Ref Range   Glucose-Capillary 75 65 - 99 mg/dL  Glucose, capillary     Status: None   Collection Time: 07/13/15 11:19 AM  Result Value Ref Range   Glucose-Capillary 87 65 - 99 mg/dL   Comment 1 Notify RN   Glucose, capillary     Status: None   Collection Time: 07/13/15  4:56 PM  Result Value Ref Range   Glucose-Capillary 76 65 - 99 mg/dL   Comment 1 Notify RN   Glucose, capillary     Status: None   Collection Time: 07/13/15  9:04 PM  Result Value Ref Range   Glucose-Capillary 82 65 - 99 mg/dL  Glucose, capillary     Status: None   Collection Time: 07/14/15  6:53 AM  Result Value Ref Range   Glucose-Capillary 70 65 - 99 mg/dL   Comment 1 Notify RN      HEENT: Normocephalic, atraumatic Cardio: RRR and no murmur Resp: CTA B/L and unlabored GI: BS positive and LLQ colostomy, midline open abd wound with Wet to Dry ,  Skin:   Wound see above, no heel ulceration. VAC suctioning without leaks Neuro: Alert/Oriented  Sensation intact to light touch Musc/Skel:  No tenderness of extremities No Edema Antigravity strength throughout, except for b/l hip flexors Gen NAD. Vital signs reviewed   Assessment/Plan: 1. Functional deficits secondary to debilitation/Crohn's disease/perforated left colon with peritonitis/exploratory laparotomy with drainage of intra-abdominal abscess partial colon resection with colostomy which require 3+ hours per day of interdisciplinary therapy in a comprehensive inpatient rehab setting. Physiatrist is providing close team supervision and 24 hour management of active medical problems listed below. Physiatrist and rehab team continue to assess barriers to discharge/monitor patient progress toward functional and medical goals.  Team conference today please see physician documentation under team conference tab, met with  team face-to-face to discuss problems,progress, and goals. Formulized individual treatment plan based on medical history, underlying problem and comorbidities. FIM: Function - Bathing Position: Other (comment) (Edge of bed for upper body and supine/HOB elevated for lower) Body parts bathed by patient: Right arm, Left arm, Chest, Abdomen, Right upper leg, Left upper leg Body parts bathed by helper: Left lower leg, Right lower leg, Front perineal area Bathing not applicable: Abdomen, Buttocks, Front perineal area (peri care done prior to OT session) Assist Level: Touching or steadying assistance(Pt > 75%)  Function- Upper Body Dressing/Undressing What is the patient wearing?: Hospital gown Bra - Perfomed by patient: Thread/unthread right bra strap, Thread/unthread left bra strap Bra - Perfomed by helper: Hook/unhook bra (pull down sports bra) Pull over shirt/dress - Perfomed by patient: Thread/unthread right sleeve, Thread/unthread left sleeve Pull over shirt/dress - Perfomed by helper: Pull shirt over trunk Assist Level: Touching or steadying assistance(Pt > 75%) Set up : To obtain clothing/put away Function - Lower Body Dressing/Undressing What is the patient wearing?: Non-skid slipper socks Position: Bed Pants- Performed by patient: Pull pants up/down Pants- Performed by helper: Thread/unthread right pants leg, Thread/unthread left pants leg, Pull pants up/down Non-skid slipper socks- Performed by patient: Don/doff right sock, Don/doff left sock Non-skid slipper socks- Performed by helper: Don/doff right sock, Don/doff left sock Assist for lower body dressing: Touching or steadying assistance (Pt > 75%)  Function - Toileting Toileting activity did not occur: Safety/medical concerns Toileting steps completed by patient: Adjust clothing prior to toileting, Performs perineal hygiene, Adjust clothing after toileting Toileting steps completed by helper: Adjust clothing prior to toileting,  Performs perineal hygiene, Adjust clothing after toileting  Assist level: Touching or steadying assistance (Pt.75%)  Function - Air cabin crew transfer activity did not occur: Safety/medical concerns Toilet transfer assistive device: Mechanical lift Mechanical lift: Maximove Assist level to toilet: 2 helpers Assist level from toilet: 2 helpers  Function - Chair/bed transfer Chair/bed transfer activity did not occur: Safety/medical concerns Chair/bed transfer method: Lateral scoot Chair/bed transfer assist level: 2 helpers (+3 assist) Chair/bed transfer assistive device: Bedrails, Armrests, Sliding board Mechanical lift: Maximove Chair/bed transfer details: Verbal cues for safe use of DME/AE, Manual facilitation for weight shifting, Manual facilitation for placement, Verbal cues for precautions/safety, Verbal cues for technique, Verbal cues for sequencing  Function - Locomotion: Wheelchair Will patient use wheelchair at discharge?: Yes Type: Manual Wheelchair activity did not occur: Safety/medical concerns (debility and awaiting appropriate equipment) Max wheelchair distance: 32' + 50' Assist Level: Moderate assistance (Pt 50 - 74%) (min A in straight line; mod A to turn) Assist Level: Moderate assistance (Pt 50 - 74%) Assist Level: Dependent (Pt equals 0%) Turns around,maneuvers to table,bed, and toilet,negotiates 3% grade,maneuvers on rugs and over doorsills: No Function - Locomotion: Ambulation Ambulation activity did not occur: Safety/medical concerns (significant debility)  Function - Comprehension Comprehension: Auditory Comprehension assist level: Understands complex 90% of the time/cues 10% of the time  Function - Expression Expression: Verbal Expression assist level: Expresses complex ideas: With extra time/assistive device  Function - Social Interaction Social Interaction assist level: Interacts appropriately with others with medication or extra time  (anti-anxiety, antidepressant).  Function - Problem Solving Problem solving assist level: Solves complex problems: With extra time  Function - Memory Memory assist level: Complete Independence: No helper Patient normally able to recall (first 3 days only): Current season, Location of own room, Staff names and faces, That he or she is in a hospital   Medical Problem List and Plan: 1. Functional deficits secondary to debilitation/Crohn's disease/perforated left colon with peritonitis/exploratory laparotomy with drainage of intra-abdominal abscess partial colon resection with colostomy. Restarted  Pentasa 500 mg 4 times a day on 10/15 was several days without it -nausea may be Crohn's related, better today, will monitor intake as well as colostomy output 2.  DVT Prophylaxis/Anticoagulation: Subcutaneous heparin. Monitor platelet counts, currently no signs of bleeding, cbc in am 3. Pain Management: Hydrocodone as needed 4. Acute blood loss anemia. Monitor while on heparin, hgb 7.9 on 10/18 5. Neuropsych: This patient is capable of making decisions on her own behalf. 6. Skin/Wound Care: dehiscence noted at muco cutaneous jct of stoma- CCS seeing pt on M-W-F and WOC RN following, s/p placement of wound vac,  7. Fluids/Electrolytes/Nutrition: Strict I&O with follow-up chemistries 8. Acute renal insufficiency ; improving, 1.27 on 10/19 . Will continue to monitor periodically.  9. Acute respiratory failure/VDRF. Extubated 06/14/2015.propable obesity hypoventilation syndrome, cont PRN O2 10. SVT. Monitor heart rate. Lopressor 50 mg twice a day, pt also severely deconditioned with poor exercise tolerance, am HR 112, cont current dose 11. Chronic systolic congestive heart failure. Monitor for any signs of fluid overload. Lasix 40 mg twice a day, monitor intake and creat, no SOB, sats ok, no change in dose at present 12.  UTI ecoli- s/p treatment recheck UA looks like recurrent UTI afeb, repeat cx e. Coli.   Will tx.   13. Morbid obesity. Body mass index 67.33 14.  Mild hypokalemia - will supplement  15.  Severe hypoalbuminemia- supplement , this is contributing to delayed wound healing , doesn't tolerate beneprotein but drinks ensure ~3 cans a day.  Discussed  with pt importance of nutrition.   15.  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    LOS (Days) 18 A FACE TO FACE EVALUATION WAS PERFORMED  Dominique [name] Lorie Phenix 07/14/2015, 8:55 AM

## 2015-07-14 NOTE — Progress Notes (Signed)
Patient ID: Dominique [name], female   DOB: 07-May-1981, 34 y.o.   MRN: [id number]     [facility]      [address] [place], [place] [address]    Phone: [phone] FAX: [phone]     Subjective: Does not have a good appetite.  Ostomy functioning.  Serosanguinous drainage from vac.   Objective:  Vital signs:  Filed Vitals:   07/13/15 1107 07/13/15 1300 07/13/15 2102 07/14/15 0523  BP: 86/51 82/41 112/50 110/53  Pulse: 113 114 112 112  Temp:  98.6 F (37 C)  97.7 F (36.5 C)  TempSrc:  Oral  Oral  Resp:  18  18  Weight:    166.017 kg (366 lb)  SpO2:  96%  98%    Last BM Date: 07/13/15  Intake/Output   Yesterday:  10/20 0701 - 10/21 0700 In: 240 [P.O.:240] Out: 1410 [Urine:1100; Drains:110; Stool:200] This shift:      Physical Exam: General: Pt awake/alert/oriented x4 in no acute distress  Abdomen: Soft.  Nondistended. Tender over wound.  Wound is beefy red and clean, fascia has separated with good granulation tissue.  LLQ stoma is pink and viable, adhered to the left side of the skin, mucocutaneous separation on the right, but no purulent drainage.    No evidence of peritonitis.  No incarcerated hernias.   Problem List:   Principal Problem:   Debilitated Active Problems:   Morbid obesity- BMI 67.5   OSA (obstructive sleep apnea)   Crohn's disease (HCC)   Chronic systolic CHF (congestive heart failure) (HCC)   Adjustment disorder with mixed anxiety and depressed mood    Results:   Labs: Results for orders placed or performed during the hospital encounter of 06/26/15 (from the past 48 hour(s))  Urinalysis, Routine w reflex microscopic (not at [facility])     Status: Abnormal   Collection Time: 07/12/15 10:05 AM  Result Value Ref Range   Color, Urine AMBER (A) YELLOW    Comment: BIOCHEMICALS MAY BE AFFECTED BY COLOR   APPearance CLOUDY (A) CLEAR   Specific Gravity, Urine 1.018 1.005 - 1.030   pH 5.0 5.0 - 8.0   Glucose, UA NEGATIVE NEGATIVE mg/dL   Hgb urine dipstick LARGE (A) NEGATIVE   Bilirubin Urine SMALL (A) NEGATIVE   Ketones, ur 15 (A) NEGATIVE mg/dL   Protein, ur NEGATIVE NEGATIVE mg/dL   Urobilinogen, UA 1.0 0.0 - 1.0 mg/dL   Nitrite POSITIVE (A) NEGATIVE   Leukocytes, UA MODERATE (A) NEGATIVE  Urine microscopic-add on     Status: Abnormal   Collection Time: 07/12/15 10:05 AM  Result Value Ref Range   Squamous Epithelial / LPF FEW (A) RARE   WBC, UA 21-50 <3 WBC/hpf   RBC / HPF 0-2 <3 RBC/hpf   Bacteria, UA MANY (A) RARE   Casts HYALINE CASTS (A) NEGATIVE   Urine-Other AMORPHOUS URATES/PHOSPHATES   Urine culture     Status: None (Preliminary result)   Collection Time: 07/12/15 10:06 AM  Result Value Ref Range   Specimen Description URINE, CATHETERIZED    Special Requests NONE    Culture >=100,000 COLONIES/mL ESCHERICHIA COLI    Report Status PENDING   Glucose, capillary     Status: None   Collection Time: 07/12/15 11:29 AM  Result Value Ref Range   Glucose-Capillary 88 65 - 99 mg/dL   Comment 1 Notify RN   Basic metabolic panel     Status: Abnormal   Collection Time: 07/12/15  4:00 PM  Result Value Ref Range   Sodium 139 135 - 145 mmol/L   Potassium 3.9 3.5 - 5.1 mmol/L   Chloride 95 (L) 101 - 111 mmol/L   CO2 32 22 - 32 mmol/L   Glucose, Bld 97 65 - 99 mg/dL   BUN 19 6 - 20 mg/dL   Creatinine, Ser 1.27 (H) 0.44 - 1.00 mg/dL   Calcium 7.7 (L) 8.9 - 10.3 mg/dL   GFR calc non Af Amer 54 (L) >60 mL/min   GFR calc Af Amer >60 >60 mL/min    Comment: (NOTE) The eGFR has been calculated using the CKD EPI equation. This calculation has not been validated in all clinical situations. eGFR's persistently <60 mL/min signify possible Chronic Kidney Disease.    Anion gap 12 5 - 15  Glucose, capillary     Status: None   Collection Time: 07/12/15  4:30 PM  Result Value Ref Range   Glucose-Capillary 96 65 - 99 mg/dL   Comment 1 Notify RN   Glucose, capillary     Status: None    Collection Time: 07/12/15  8:44 PM  Result Value Ref Range   Glucose-Capillary 91 65 - 99 mg/dL   Comment 1 Notify RN   Glucose, capillary     Status: None   Collection Time: 07/13/15  6:09 AM  Result Value Ref Range   Glucose-Capillary 75 65 - 99 mg/dL  Glucose, capillary     Status: None   Collection Time: 07/13/15 11:19 AM  Result Value Ref Range   Glucose-Capillary 87 65 - 99 mg/dL   Comment 1 Notify RN   Glucose, capillary     Status: None   Collection Time: 07/13/15  4:56 PM  Result Value Ref Range   Glucose-Capillary 76 65 - 99 mg/dL   Comment 1 Notify RN   Glucose, capillary     Status: None   Collection Time: 07/13/15  9:04 PM  Result Value Ref Range   Glucose-Capillary 82 65 - 99 mg/dL  Glucose, capillary     Status: None   Collection Time: 07/14/15  6:53 AM  Result Value Ref Range   Glucose-Capillary 70 65 - 99 mg/dL   Comment 1 Notify RN     Imaging / Studies: No results found.  Medications / Allergies:  Scheduled Meds: . ALPRAZolam  0.25 mg Oral BID WC  . feeding supplement (ENSURE ENLIVE)  237 mL Oral TID BM  . furosemide  40 mg Oral BID  . heparin  5,000 Units Subcutaneous 3 times per day  . mesalamine  500 mg Oral TID  . metoCLOPramide  5 mg Oral TID AC  . metoprolol tartrate  50 mg Oral BID  . pantoprazole  40 mg Oral Daily  . polyethylene glycol  17 g Oral Daily  . potassium chloride  20 mEq Oral Daily  . protein supplement  1 scoop Oral TID WC  . saccharomyces boulardii  250 mg Oral BID   Continuous Infusions:  PRN Meds:.guaiFENesin, HYDROcodone-acetaminophen, methocarbamol, ondansetron **OR** ondansetron (ZOFRAN) IV, simethicone, sodium chloride, sorbitol  Antibiotics: Anti-infectives    Start     Dose/Rate Route Frequency Ordered Stop   06/30/15 2000  ciprofloxacin (CIPRO) tablet 250 mg  Status:  Discontinued     250 mg Oral 2 times daily 06/30/15 1716 07/02/15 1418       Assessment/Plan Perforated descending colon, colitis POD 31,33  s/p ex lap with DRAINAGE OF INTRA-ABDOMINAL ABSCESS, PARTIAL COLON RESECTION, re-exploration and creation  of colostomy and abdominal closure---Dr. Lanier Clam - wound is clean. Cont wound VAC -stoma--mucocutaneous separation, pink and viable, no purulent drainage today  -miralax -up with therapies -abd binder  -no new intervention    Erby Pian, ANP-BC [facility] Pager [phone](7A-4:30P) For consults and floor pages call [phone](7A-4:30P)  07/14/2015 8:06 AM

## 2015-07-14 NOTE — Progress Notes (Signed)
Pt continuing to refuse nightly cpap

## 2015-07-14 NOTE — Consult Note (Signed)
WOC follow-up: Ostomy pouching: Ostomy pouch changed today related to leakage behind barrier.Applied 2 piece pouch with barrier ring. Stoma is 100% red and moist, slightly above skin level, 1 1/2 inches, bleeding slightly, mucutaneous separation occurring to 50% of peristomal edges, from 6:00 o'clock to 12:00 o'clock with mod amt stool in the peristomal wound.  Red granulating tissue underneath when pus is absorbed. Mod amt amt semi-formed brown stool in pouch. Education provided: Demonstrated pouch change using 2 piece pouch and a barrier ring to attempt to maintain seal. Applied strip of Aquacel packing into separation edges of stoma. Enrolled patient in [facility] program: No Ostomy supplies and instructions at the bedside for nurse use. Pt watched procedure and asked appropriate questions. She will need total assistance with pouching upon discharge until this complex problem is resolved.  Wound type: Full thickness midline abd wound, CCS following for assessment and plan of care; PA at bedside to assess stoma and abd wound.Changed Vac dressing. no further pus at this time, strong odor and mod amt reddish-tan drainage in the cannister. Full thickness post-op wound 95% red, 5% yellow slough in patchy areas. Applied Mepitel contact layer, then one piece of white foam to lower inner wound, then one piece of black foam to 112m cont suction. Pt tolerated with mod amt discomfort. Plan to change dressing on M/W/F at 0800.  DJulien GirtMSN, RN, CWOCN, CWCN-AP, CNS

## 2015-07-15 ENCOUNTER — Inpatient Hospital Stay (HOSPITAL_COMMUNITY): Payer: Medicaid Other | Admitting: Occupational Therapy

## 2015-07-15 ENCOUNTER — Inpatient Hospital Stay (HOSPITAL_COMMUNITY): Payer: Medicaid Other | Admitting: Physical Therapy

## 2015-07-15 LAB — GLUCOSE, CAPILLARY
GLUCOSE-CAPILLARY: 93 mg/dL (ref 65–99)
GLUCOSE-CAPILLARY: 99 mg/dL (ref 65–99)
Glucose-Capillary: 100 mg/dL — ABNORMAL HIGH (ref 65–99)
Glucose-Capillary: 93 mg/dL (ref 65–99)

## 2015-07-15 MED ORDER — ASPIRIN 81 MG PO CHEW
81.0000 mg | CHEWABLE_TABLET | Freq: Once | ORAL | Status: AC
  Administered 2015-07-15: 81 mg via ORAL
  Filled 2015-07-15: qty 1

## 2015-07-15 MED ORDER — SODIUM CHLORIDE 0.9 % IV SOLN
INTRAVENOUS | Status: AC
  Administered 2015-07-16: 01:00:00 via INTRAVENOUS

## 2015-07-15 NOTE — Progress Notes (Signed)
Occupational Therapy Session Note  Patient Details  Name: Dominique [name] MRN: [id number] Date of Birth: 1981/02/19  Today's Date: 07/15/2015 OT Individual Time: 4008-6761 OT Individual Time Calculation (min): 45 min    Short Term Goals: Week 1:  OT Short Term Goal 1 (Week 1): Pt will complete upper body bathing seated with Mod Assist OT Short Term Goal 1 - Progress (Week 1): Met OT Short Term Goal 2 (Week 1): Pt will complete transfer from EOB to w/c with Max Assist OT Short Term Goal 2 - Progress (Week 1): Progressing toward goal OT Short Term Goal 3 (Week 1): Pt will complete lower body bathing supine with Mod Assist OT Short Term Goal 3 - Progress (Week 1): Progressing toward goal OT Short Term Goal 4 (Week 1): Pt will complete toileting with Max Assist  OT Short Term Goal 4 - Progress (Week 1): Discontinued (comment) (d/t foley cath and ostomy) OT Short Term Goal 5 (Week 1): Pt will demo ability to complete HEP for BUE with supervision OT Short Term Goal 5 - Progress (Week 1): Met Week 2:  OT Short Term Goal 1 (Week 2): Pt will complete upper body dressing sitting unsupported at EOB with Min assist. OT Short Term Goal 1 - Progress (Week 2): Met OT Short Term Goal 2 (Week 2): Pt will complete grooming sititng at EOB unsupported with supervision and setup OT Short Term Goal 2 - Progress (Week 2): Met OT Short Term Goal 3 (Week 2): Pt will complete slide board transfer, w/c <> raised mat, with +2 assist only (no Maxi-Sky sling used) OT Short Term Goal 3 - Progress (Week 2): Progressing toward goal Week 3:  OT Short Term Goal 1 (Week 3): Pt will complete slide board transfer, w/c <> raised mat, with +2 assist only (no mechanical lift assist support required) OT Short Term Goal 2 (Week 3): Pt will demo ability to direct caregiver(s) with transfer sequence using mechanical lift with min questioning cues OT Short Term Goal 3 (Week 3): Pt will tolerate supported upright sitting posture for 2  consecutive hours with total time in position for at least 4 hours daily. OT Short Term Goal 4 (Week 3): Pt will complete bed mobiity using DME/AE as needed with mod assist  Skilled Therapeutic Interventions/Progress Updates:    Pt seen this session to facilitate functional mobility with there ex to develop UE/ LE strength. Pt stated she was bathed last night by nursing and did not need further self care this am. Pt worked on AROM of hips with alternating bent knee hip add/abd and then with both legs together to facilitate increased back mobility. Pt cued for deep breathing with B legs rotating side to side. Feet held into position to avoid heel slide. Pt positioned in reverse trendelenburg with feet on foot board for calf raises and mini knee bends. Pt positioned back into supine with HOB elevated. She engaged in BUE AROM exercises and reaching towards knees. Pt tolerated therapy well this am and participated well. Pt resting in bed with all needs met.  Therapy Documentation Precautions:  Precautions Precautions: Fall Precaution Comments: abdominal wound vac, colostomy Required Braces or Orthoses: Other Brace/Splint Other Brace/Splint: abdominal binder when OOB Restrictions Weight Bearing Restrictions: No    Vital Signs: Therapy Vitals Temp: 98.7 F (37.1 C) Temp Source: Oral Pulse Rate: 98 Resp: 20 BP: 122/61 mmHg Patient Position (if appropriate): Lying Oxygen Therapy SpO2: 99 % O2 Device: Not Delivered O2 Flow Rate (L/min): 1 L/min Pain:  Pain Assessment Pain Assessment: 0-10 Pain Type: Acute pain Pain Location: Abdomen Pain Orientation: Mid Pain Frequency: Intermittent Pain Intervention(s): Medication (See eMAR) ADL: ADL ADL Comments: see Functional Assessment  See Function Navigator for Current Functional Status.   Therapy/Group: Individual Therapy  SAGUIER,JULIA 07/15/2015, 9:37 AM

## 2015-07-15 NOTE — Progress Notes (Signed)
At 2030 checked pt b/p 99/41, HR 140. Reassessed at 2045 HR manually for 144, upon assessment pt complained of mild lightheadedness and "wierd" feeling across though her shoulders and chest area. MD notified, orders received. EKG obtained, read back to Dr. Posey Pronto on-call. Pt still reports feeling the same at 2125. MD called to check up on pt status and new orders received. Asprin 81 mg given PO per order. Pt tolerated well. Pt reported feeling a little better, but sleepy at 2200. Lab arrived in room and drew labs at 2220, pending results. Rechecked on patient at 2240, pt sleep. Will cont to monitor patient. Casy Brunetto, Dione Plover

## 2015-07-15 NOTE — Progress Notes (Signed)
Subjective/Complaints: She continues to complain of stomach pain and inquires about possible solutions. Informed patient that she likely has a UTI and medications were started yesterday. If symptoms not improve with medication administration will consider change in pain therapy. She states she slept on and off last night.  ROS:  +Abd pain. No CP or SOB, no increased limb swelling   Objective: Vital Signs: Blood pressure 100/47, pulse 97, temperature 98.7 F (37.1 C), temperature source Oral, resp. rate 20, weight 159.666 kg (352 lb), SpO2 99 %. No results found. Results for orders placed or performed during the hospital encounter of 06/26/15 (from the past 72 hour(s))  Urinalysis, Routine w reflex microscopic (not at [facility])     Status: Abnormal   Collection Time: 07/12/15 10:05 AM  Result Value Ref Range   Color, Urine AMBER (A) YELLOW    Comment: BIOCHEMICALS MAY BE AFFECTED BY COLOR   APPearance CLOUDY (A) CLEAR   Specific Gravity, Urine 1.018 1.005 - 1.030   pH 5.0 5.0 - 8.0   Glucose, UA NEGATIVE NEGATIVE mg/dL   Hgb urine dipstick LARGE (A) NEGATIVE   Bilirubin Urine SMALL (A) NEGATIVE   Ketones, ur 15 (A) NEGATIVE mg/dL   Protein, ur NEGATIVE NEGATIVE mg/dL   Urobilinogen, UA 1.0 0.0 - 1.0 mg/dL   Nitrite POSITIVE (A) NEGATIVE   Leukocytes, UA MODERATE (A) NEGATIVE  Urine microscopic-add on     Status: Abnormal   Collection Time: 07/12/15 10:05 AM  Result Value Ref Range   Squamous Epithelial / LPF FEW (A) RARE   WBC, UA 21-50 <3 WBC/hpf   RBC / HPF 0-2 <3 RBC/hpf   Bacteria, UA MANY (A) RARE   Casts HYALINE CASTS (A) NEGATIVE   Urine-Other AMORPHOUS URATES/PHOSPHATES   Urine culture     Status: None   Collection Time: 07/12/15 10:06 AM  Result Value Ref Range   Specimen Description URINE, CATHETERIZED    Special Requests NONE    Culture >=100,000 COLONIES/mL ESCHERICHIA COLI    Report Status 07/14/2015 FINAL    Organism ID, Bacteria ESCHERICHIA COLI        Susceptibility   Escherichia coli - MIC*    AMPICILLIN >=32 RESISTANT Resistant     CEFAZOLIN 8 SENSITIVE Sensitive     CEFTRIAXONE <=1 SENSITIVE Sensitive     CIPROFLOXACIN >=4 RESISTANT Resistant     GENTAMICIN >=16 RESISTANT Resistant     IMIPENEM <=0.25 SENSITIVE Sensitive     NITROFURANTOIN 64 INTERMEDIATE Intermediate     TRIMETH/SULFA >=320 RESISTANT Resistant     AMPICILLIN/SULBACTAM >=32 RESISTANT Resistant     PIP/TAZO <=4 SENSITIVE Sensitive     * >=100,000 COLONIES/mL ESCHERICHIA COLI  Glucose, capillary     Status: None   Collection Time: 07/12/15 11:29 AM  Result Value Ref Range   Glucose-Capillary 88 65 - 99 mg/dL   Comment 1 Notify RN   Basic metabolic panel     Status: Abnormal   Collection Time: 07/12/15  4:00 PM  Result Value Ref Range   Sodium 139 135 - 145 mmol/L   Potassium 3.9 3.5 - 5.1 mmol/L   Chloride 95 (L) 101 - 111 mmol/L   CO2 32 22 - 32 mmol/L   Glucose, Bld 97 65 - 99 mg/dL   BUN 19 6 - 20 mg/dL   Creatinine, Ser 1.27 (H) 0.44 - 1.00 mg/dL   Calcium 7.7 (L) 8.9 - 10.3 mg/dL   GFR calc non Af Amer 54 (L) >60 mL/min  GFR calc Af Amer >60 >60 mL/min    Comment: (NOTE) The eGFR has been calculated using the CKD EPI equation. This calculation has not been validated in all clinical situations. eGFR's persistently <60 mL/min signify possible Chronic Kidney Disease.    Anion gap 12 5 - 15  Glucose, capillary     Status: None   Collection Time: 07/12/15  4:30 PM  Result Value Ref Range   Glucose-Capillary 96 65 - 99 mg/dL   Comment 1 Notify RN   Glucose, capillary     Status: None   Collection Time: 07/12/15  8:44 PM  Result Value Ref Range   Glucose-Capillary 91 65 - 99 mg/dL   Comment 1 Notify RN   Glucose, capillary     Status: None   Collection Time: 07/13/15  6:09 AM  Result Value Ref Range   Glucose-Capillary 75 65 - 99 mg/dL  Glucose, capillary     Status: None   Collection Time: 07/13/15 11:19 AM  Result Value Ref Range    Glucose-Capillary 87 65 - 99 mg/dL   Comment 1 Notify RN   Glucose, capillary     Status: None   Collection Time: 07/13/15  4:56 PM  Result Value Ref Range   Glucose-Capillary 76 65 - 99 mg/dL   Comment 1 Notify RN   Glucose, capillary     Status: None   Collection Time: 07/13/15  9:04 PM  Result Value Ref Range   Glucose-Capillary 82 65 - 99 mg/dL  Glucose, capillary     Status: None   Collection Time: 07/14/15  6:53 AM  Result Value Ref Range   Glucose-Capillary 70 65 - 99 mg/dL   Comment 1 Notify RN   Glucose, capillary     Status: None   Collection Time: 07/14/15 11:49 AM  Result Value Ref Range   Glucose-Capillary 93 65 - 99 mg/dL  Glucose, capillary     Status: None   Collection Time: 07/14/15  4:37 PM  Result Value Ref Range   Glucose-Capillary 96 65 - 99 mg/dL   Comment 1 Notify RN   Glucose, capillary     Status: None   Collection Time: 07/14/15  8:47 PM  Result Value Ref Range   Glucose-Capillary 91 65 - 99 mg/dL   Comment 1 Notify RN   Glucose, capillary     Status: Abnormal   Collection Time: 07/15/15  6:44 AM  Result Value Ref Range   Glucose-Capillary 100 (H) 65 - 99 mg/dL   Comment 1 Notify RN      HEENT: Normocephalic, atraumatic Cardio: RRR and no murmur Resp: CTA B/L and unlabored GI: BS positive and LLQ colostomy, midline open abd wound with Wet to Dry ,  Skin:   Wound see above, no heel ulceration. VAC suctioning without leaks Neuro: Alert/Oriented  Sensation intact to light touch Musc/Skel:  No tenderness of extremities No Edema Antigravity strength throughout, except for b/l hip flexors Gen NAD. Vital signs reviewed   Assessment/Plan: 1. Functional deficits secondary to debilitation/Crohn's disease/perforated left colon with peritonitis/exploratory laparotomy with drainage of intra-abdominal abscess partial colon resection with colostomy which require 3+ hours per day of interdisciplinary therapy in a comprehensive inpatient rehab  setting. Physiatrist is providing close team supervision and 24 hour management of active medical problems listed below. Physiatrist and rehab team continue to assess barriers to discharge/monitor patient progress toward functional and medical goals.  Team conference today please see physician documentation under team conference tab, met with team  face-to-face to discuss problems,progress, and goals. Formulized individual treatment plan based on medical history, underlying problem and comorbidities. FIM: Function - Bathing Position: Other (comment) (Edge of bed for upper body and supine/HOB elevated for lower) Body parts bathed by patient: Right arm, Left arm, Chest, Abdomen, Right upper leg, Left upper leg Body parts bathed by helper: Left lower leg, Right lower leg, Front perineal area Bathing not applicable: Abdomen, Buttocks, Front perineal area (peri care done prior to OT session) Assist Level: Touching or steadying assistance(Pt > 75%)  Function- Upper Body Dressing/Undressing What is the patient wearing?: Hospital gown Bra - Perfomed by patient: Thread/unthread right bra strap, Thread/unthread left bra strap Bra - Perfomed by helper: Hook/unhook bra (pull down sports bra) Pull over shirt/dress - Perfomed by patient: Thread/unthread right sleeve, Thread/unthread left sleeve Pull over shirt/dress - Perfomed by helper: Pull shirt over trunk Assist Level: Touching or steadying assistance(Pt > 75%) Set up : To obtain clothing/put away Function - Lower Body Dressing/Undressing What is the patient wearing?: Non-skid slipper socks Position: Bed Pants- Performed by patient: Pull pants up/down Pants- Performed by helper: Thread/unthread right pants leg, Thread/unthread left pants leg, Pull pants up/down Non-skid slipper socks- Performed by patient: Don/doff right sock, Don/doff left sock Non-skid slipper socks- Performed by helper: Don/doff right sock, Don/doff left sock Assist for lower  body dressing: Touching or steadying assistance (Pt > 75%)  Function - Toileting Toileting activity did not occur: Safety/medical concerns Toileting steps completed by patient: Adjust clothing prior to toileting, Performs perineal hygiene, Adjust clothing after toileting Toileting steps completed by helper: Adjust clothing prior to toileting, Performs perineal hygiene, Adjust clothing after toileting Assist level: Touching or steadying assistance (Pt.75%)  Function - Air cabin crew transfer activity did not occur: Safety/medical concerns Toilet transfer assistive device: Mechanical lift Mechanical lift: Maximove Assist level to toilet: 2 helpers Assist level from toilet: 2 helpers  Function - Chair/bed transfer Chair/bed transfer activity did not occur: Safety/medical concerns Chair/bed transfer method: Other Chair/bed transfer assist level: 2 helpers Chair/bed transfer assistive device: Mechanical lift Mechanical lift: Maximove Chair/bed transfer details: Verbal cues for safe use of DME/AE, Manual facilitation for placement, Verbal cues for precautions/safety  Function - Locomotion: Wheelchair Will patient use wheelchair at discharge?: Yes Type: Manual Wheelchair activity did not occur: Safety/medical concerns (debility and awaiting appropriate equipment) Max wheelchair distance: 22' Assist Level: Touching or steadying assistance (Pt > 75%) Assist Level: Moderate assistance (Pt 50 - 74%) Assist Level: Dependent (Pt equals 0%) Turns around,maneuvers to table,bed, and toilet,negotiates 3% grade,maneuvers on rugs and over doorsills: No Function - Locomotion: Ambulation Ambulation activity did not occur: Safety/medical concerns (significant debility)  Function - Comprehension Comprehension: Auditory Comprehension assist level: Understands complex 90% of the time/cues 10% of the time  Function - Expression Expression: Verbal Expression assist level: Expresses complex  ideas: With extra time/assistive device  Function - Social Interaction Social Interaction assist level: Interacts appropriately with others with medication or extra time (anti-anxiety, antidepressant).  Function - Problem Solving Problem solving assist level: Solves complex problems: With extra time  Function - Memory Memory assist level: Complete Independence: No helper Patient normally able to recall (first 3 days only): Current season, Location of own room, Staff names and faces, That he or she is in a hospital   Medical Problem List and Plan: 1. Functional deficits secondary to debilitation/Crohn's disease/perforated left colon with peritonitis/exploratory laparotomy with drainage of intra-abdominal abscess partial colon resection with colostomy. Restarted  Pentasa 500 mg 4 times  a day on 10/15 was several days without it -nausea may be Crohn's related, better today, will monitor intake as well as colostomy output 2.  DVT Prophylaxis/Anticoagulation: Subcutaneous heparin. Monitor platelet counts, currently no signs of bleeding, cbc in am 3. Pain Management: Hydrocodone as needed 4. Acute blood loss anemia. Monitor while on heparin, hgb 7.9 on 10/18.  Repeat labs ordered for Monday. 5. Neuropsych: This patient is capable of making decisions on her own behalf. 6. Skin/Wound Care: dehiscence noted at muco cutaneous jct of stoma- CCS seeing pt on M-W-F and WOC RN following, s/p placement of wound vac,  7. Fluids/Electrolytes/Nutrition: Strict I&O with follow-up chemistries 8. Acute renal insufficiency ; improving, 1.27 on 10/19 . Will continue to monitor periodically. Repeat labs ordered for Monday.   9. Acute respiratory failure/VDRF. Extubated 06/14/2015.propable obesity hypoventilation syndrome, cont PRN O2 10. SVT. Monitor heart rate. Lopressor 50 mg twice a day, pt also severely deconditioned with poor exercise tolerance, am HR 94, cont current dose 11. Chronic systolic congestive heart  failure. Monitor for any signs of fluid overload. Lasix 40 mg twice a day, monitor intake and creat, no SOB, sats ok, no change in dose at present 12.  UTI ecoli- s/p treatment recheck UA looks like recurrent UTI afeb, repeat cx e. Coli.  Keflex started 10/21-10/28.   13. Morbid obesity. Body mass index 67.33 14.  Mild hypokalemia - will supplement  15.  Severe hypoalbuminemia- supplement , this is contributing to delayed wound healing , doesn't tolerate beneprotein but drinks ensure ~3 cans a day.  Discussed with pt importance of nutrition.   15.  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    LOS (Days) 19 A FACE TO FACE EVALUATION WAS PERFORMED  Margeaux Swantek Lorie Phenix 07/15/2015, 8:12 AM

## 2015-07-15 NOTE — Progress Notes (Signed)
Brief Note:  Pt experiencing chest pain/pressure this evening with tingling in her shoulders, with light-headedness.  HR in 140s on manual reading with relatively low BP.  She has had poor PO intake with solids and only taking in about ~297m liquids today.  Given pt's cardiac history, stat ECG and enzymes ordered.  Will await results.  If negative, will consider IVF.

## 2015-07-15 NOTE — Progress Notes (Signed)
Physical Therapy Session Note  Patient Details  Name: Dominique [name] MRN: [id number] Date of Birth: [birth year]  Today's Date: 07/15/2015 PT Individual Time: 1430-1500 PT Individual Time Calculation (min): 30 min   Short Term Goals: Week 3:  PT Short Term Goal 1 (Week 3): Pt will demonstrate dynamic sitting balance with min A for slideboards and functional activities EOB and edge of mat x 10-15 min PT Short Term Goal 2 (Week 3): Pt will perform sit <> stand from elevated mat with appropriate lift equipment and max A and maintain standing 1-2 minutes at a time PT Short Term Goal 3 (Week 3): Pt will perform slideboard w/c <> bed with consistent mod A PT Short Term Goal 4 (Week 3): Pt will perform bed mobility with consistent min A and use of bed controls PT Short Term Goal 5 (Week 3): Pt will perform w/c mobility x 150' in controlled environment with supervision  Skilled Therapeutic Interventions/Progress Updates:    Pt received in bed - agreeable to PT with encouragement. Therapeutic Activity - PT instructs pt in rolling in bed R and L - req repeated verbal cues for technique and mod A overall to assist with leg placement and manual facilitation at breast and hip. PT has pt direct maxi move transfer and pt able to do so with 75% accuracy - 2 person assist to transfer to shuttle chair. PT ensures all needs are in reach and instructs pt to sit up for at least 1 hour. Continue per PT POC.   Therapy Documentation Precautions:  Precautions Precautions: Fall Precaution Comments: abdominal wound vac, colostomy Required Braces or Orthoses: Other Brace/Splint Other Brace/Splint: abdominal binder when OOB Restrictions Weight Bearing Restrictions: No Pain: Pain Assessment Pain Assessment: No/denies pain Pain Score: 4  Pain Type: Acute pain Pain Location: Abdomen Pain Orientation: Mid Pain Descriptors / Indicators: Sore Pain Frequency: Intermittent Pain Intervention(s): Medication (See  eMAR)   See Function Navigator for Current Functional Status.   Therapy/Group: Individual Therapy and with Annie Main, PT Tech as +2  [facility] M 07/15/2015, 3:43 PM

## 2015-07-16 ENCOUNTER — Inpatient Hospital Stay (HOSPITAL_COMMUNITY): Payer: Medicaid Other

## 2015-07-16 LAB — BASIC METABOLIC PANEL
ANION GAP: 8 (ref 5–15)
BUN: 15 mg/dL (ref 6–20)
CALCIUM: 7.3 mg/dL — AB (ref 8.9–10.3)
CHLORIDE: 97 mmol/L — AB (ref 101–111)
CO2: 33 mmol/L — AB (ref 22–32)
Creatinine, Ser: 1.04 mg/dL — ABNORMAL HIGH (ref 0.44–1.00)
GFR calc non Af Amer: >60 mL/min (ref >60)
GLUCOSE: 88 mg/dL (ref 65–99)
POTASSIUM: 3.6 mmol/L (ref 3.5–5.1)
SODIUM: 138 mmol/L (ref 135–145)

## 2015-07-16 LAB — GLUCOSE, CAPILLARY
GLUCOSE-CAPILLARY: 92 mg/dL (ref 65–99)
GLUCOSE-CAPILLARY: 90 mg/dL (ref 65–99)
Glucose-Capillary: 90 mg/dL (ref 65–99)
Glucose-Capillary: 93 mg/dL (ref 65–99)

## 2015-07-16 LAB — CBC
HCT: 25.3 % — ABNORMAL LOW (ref 36.0–46.0)
HEMOGLOBIN: 7.5 g/dL — AB (ref 12.0–15.0)
MCH: 26.2 pg (ref 26.0–34.0)
MCHC: 29.6 g/dL — AB (ref 30.0–36.0)
MCV: 88.5 fL (ref 78.0–100.0)
Platelets: 462 10*3/uL — ABNORMAL HIGH (ref 150–400)
RBC: 2.86 MIL/uL — AB (ref 3.87–5.11)
RDW: 20.6 % — ABNORMAL HIGH (ref 11.5–15.5)
WBC: 11.1 10*3/uL — ABNORMAL HIGH (ref 4.0–10.5)

## 2015-07-16 LAB — CK TOTAL AND CKMB (NOT AT ARMC)
CK, MB: 1 ng/mL (ref 0.5–5.0)
Relative Index: INVALID (ref 0.0–2.5)
Total CK: 8 U/L — ABNORMAL LOW (ref 38–234)

## 2015-07-16 LAB — TROPONIN I

## 2015-07-16 NOTE — Progress Notes (Addendum)
Subjective/Complaints: Patient seen and examined this morning resting comfortably in bed. However, overnight patient had some chest discomfort. Cardiac enzymes, EKG, and labs were ordered. EKG appears relatively similar to previous EKG. Cardiac enzymes were unremarkable. Patient was also noted to be relatively hypotensive and tachycardic.  Continues IVF was started and this morning the patient states that she feels much better. She no longer has chest discomfort or the abdominal discomfort she was having previously.Abd pain. No CP or SOB, no increased limb swelling   ROS: Denies CP, SOB, n/v/d.  Objective: Vital Signs: Blood pressure 96/55, pulse 110, temperature 98.6 F (37 C), temperature source Oral, resp. rate 17, weight 159.213 kg (351 lb), SpO2 95 %. No results found. Results for orders placed or performed during the hospital encounter of 06/26/15 (from the past 72 hour(s))  Glucose, capillary     Status: None   Collection Time: 07/13/15 11:19 AM  Result Value Ref Range   Glucose-Capillary 87 65 - 99 mg/dL   Comment 1 Notify RN   Glucose, capillary     Status: None   Collection Time: 07/13/15  4:56 PM  Result Value Ref Range   Glucose-Capillary 76 65 - 99 mg/dL   Comment 1 Notify RN   Glucose, capillary     Status: None   Collection Time: 07/13/15  9:04 PM  Result Value Ref Range   Glucose-Capillary 82 65 - 99 mg/dL  Glucose, capillary     Status: None   Collection Time: 07/14/15  6:53 AM  Result Value Ref Range   Glucose-Capillary 70 65 - 99 mg/dL   Comment 1 Notify RN   Glucose, capillary     Status: None   Collection Time: 07/14/15 11:49 AM  Result Value Ref Range   Glucose-Capillary 93 65 - 99 mg/dL  Glucose, capillary     Status: None   Collection Time: 07/14/15  4:37 PM  Result Value Ref Range   Glucose-Capillary 96 65 - 99 mg/dL   Comment 1 Notify RN   Glucose, capillary     Status: None   Collection Time: 07/14/15  8:47 PM  Result Value Ref Range    Glucose-Capillary 91 65 - 99 mg/dL   Comment 1 Notify RN   Glucose, capillary     Status: Abnormal   Collection Time: 07/15/15  6:44 AM  Result Value Ref Range   Glucose-Capillary 100 (H) 65 - 99 mg/dL   Comment 1 Notify RN   Glucose, capillary     Status: None   Collection Time: 07/15/15 11:52 AM  Result Value Ref Range   Glucose-Capillary 93 65 - 99 mg/dL   Comment 1 Notify RN   Glucose, capillary     Status: None   Collection Time: 07/15/15  4:39 PM  Result Value Ref Range   Glucose-Capillary 93 65 - 99 mg/dL   Comment 1 Notify RN   Glucose, capillary     Status: None   Collection Time: 07/15/15  8:59 PM  Result Value Ref Range   Glucose-Capillary 99 65 - 99 mg/dL   Comment 1 Notify RN   Troponin I     Status: None   Collection Time: 07/15/15 10:29 PM  Result Value Ref Range   Troponin I <0.03 <0.031 ng/mL    Comment:        NO INDICATION OF MYOCARDIAL INJURY.   CK total and CKMB (cardiac)not at [facility]     Status: Abnormal   Collection Time: 07/15/15 10:29 PM  Result  Value Ref Range   Total CK 8 (L) 38 - 234 U/L   CK, MB 1.0 0.5 - 5.0 ng/mL   Relative Index RELATIVE INDEX IS INVALID 0.0 - 2.5    Comment: WHEN CK < 100 U/L          Basic metabolic panel     Status: Abnormal   Collection Time: 07/16/15  4:50 AM  Result Value Ref Range   Sodium 138 135 - 145 mmol/L   Potassium 3.6 3.5 - 5.1 mmol/L   Chloride 97 (L) 101 - 111 mmol/L   CO2 33 (H) 22 - 32 mmol/L   Glucose, Bld 88 65 - 99 mg/dL   BUN 15 6 - 20 mg/dL   Creatinine, Ser 1.04 (H) 0.44 - 1.00 mg/dL   Calcium 7.3 (L) 8.9 - 10.3 mg/dL   GFR calc non Af Amer >60 >60 mL/min   GFR calc Af Amer >60 >60 mL/min    Comment: (NOTE) The eGFR has been calculated using the CKD EPI equation. This calculation has not been validated in all clinical situations. eGFR's persistently <60 mL/min signify possible Chronic Kidney Disease.    Anion gap 8 5 - 15  CBC     Status: Abnormal   Collection Time: 07/16/15  4:50 AM   Result Value Ref Range   WBC 11.1 (H) 4.0 - 10.5 K/uL   RBC 2.86 (L) 3.87 - 5.11 MIL/uL   Hemoglobin 7.5 (L) 12.0 - 15.0 g/dL   HCT 25.3 (L) 36.0 - 46.0 %   MCV 88.5 78.0 - 100.0 fL   MCH 26.2 26.0 - 34.0 pg   MCHC 29.6 (L) 30.0 - 36.0 g/dL   RDW 20.6 (H) 11.5 - 15.5 %   Platelets 462 (H) 150 - 400 K/uL  Glucose, capillary     Status: None   Collection Time: 07/16/15  6:53 AM  Result Value Ref Range   Glucose-Capillary 90 65 - 99 mg/dL   Comment 1 Notify RN      HEENT: Normocephalic, atraumatic Cardio: RRR and no murmur Resp: CTA B/L and unlabored GI: BS positive and LLQ colostomy, midline open abd wound with Wet to Dry ,  Skin:   Wound see above, no heel ulceration. VAC suctioning without leaks Neuro: Alert/Oriented  Sensation intact to light touch Musc/Skel:  No tenderness of extremities No Edema Antigravity strength throughout, except for b/l hip flexors Gen NAD. Vital signs reviewed   Assessment/Plan: 1. Functional deficits secondary to debilitation/Crohn's disease/perforated left colon with peritonitis/exploratory laparotomy with drainage of intra-abdominal abscess partial colon resection with colostomy which require 3+ hours per day of interdisciplinary therapy in a comprehensive inpatient rehab setting. Physiatrist is providing close team supervision and 24 hour management of active medical problems listed below. Physiatrist and rehab team continue to assess barriers to discharge/monitor patient progress toward functional and medical goals.  Team conference today please see physician documentation under team conference tab, met with team face-to-face to discuss problems,progress, and goals. Formulized individual treatment plan based on medical history, underlying problem and comorbidities. FIM: Function - Bathing Position: Other (comment) (Edge of bed for upper body and supine/HOB elevated for lower) Body parts bathed by patient: Right arm, Left arm, Chest, Abdomen,  Right upper leg, Left upper leg Body parts bathed by helper: Left lower leg, Right lower leg, Front perineal area Bathing not applicable: Abdomen, Buttocks, Front perineal area (peri care done prior to OT session) Assist Level: Touching or steadying assistance(Pt > 75%)  Function- Upper  Body Dressing/Undressing What is the patient wearing?: Hospital gown Bra - Perfomed by patient: Thread/unthread right bra strap, Thread/unthread left bra strap Bra - Perfomed by helper: Hook/unhook bra (pull down sports bra) Pull over shirt/dress - Perfomed by patient: Thread/unthread right sleeve, Thread/unthread left sleeve Pull over shirt/dress - Perfomed by helper: Pull shirt over trunk Assist Level: Touching or steadying assistance(Pt > 75%) Set up : To obtain clothing/put away Function - Lower Body Dressing/Undressing What is the patient wearing?: Non-skid slipper socks Position: Bed Pants- Performed by patient: Pull pants up/down Pants- Performed by helper: Thread/unthread right pants leg, Thread/unthread left pants leg, Pull pants up/down Non-skid slipper socks- Performed by patient: Don/doff right sock, Don/doff left sock Non-skid slipper socks- Performed by helper: Don/doff right sock, Don/doff left sock Assist for lower body dressing: Touching or steadying assistance (Pt > 75%)  Function - Toileting Toileting activity did not occur: Safety/medical concerns Toileting steps completed by patient: Adjust clothing prior to toileting, Performs perineal hygiene, Adjust clothing after toileting Toileting steps completed by helper: Adjust clothing prior to toileting, Performs perineal hygiene, Adjust clothing after toileting Assist level: Touching or steadying assistance (Pt.75%)  Function - Air cabin crew transfer activity did not occur: Safety/medical concerns Toilet transfer assistive device: Mechanical lift Mechanical lift: Maximove Assist level to toilet: 2 helpers Assist level from  toilet: 2 helpers  Function - Chair/bed transfer Chair/bed transfer activity did not occur: Safety/medical concerns Chair/bed transfer method: Other Chair/bed transfer assist level: 2 helpers Chair/bed transfer assistive device: Mechanical lift Mechanical lift: Maximove Chair/bed transfer details: Verbal cues for safe use of DME/AE, Manual facilitation for placement, Verbal cues for precautions/safety  Function - Locomotion: Wheelchair Will patient use wheelchair at discharge?: Yes Type: Manual Wheelchair activity did not occur: Safety/medical concerns (debility and awaiting appropriate equipment) Max wheelchair distance: 37' Assist Level: Touching or steadying assistance (Pt > 75%) Assist Level: Moderate assistance (Pt 50 - 74%) Assist Level: Dependent (Pt equals 0%) Turns around,maneuvers to table,bed, and toilet,negotiates 3% grade,maneuvers on rugs and over doorsills: No Function - Locomotion: Ambulation Ambulation activity did not occur: Safety/medical concerns (significant debility)  Function - Comprehension Comprehension: Auditory Comprehension assist level: Understands complex 90% of the time/cues 10% of the time  Function - Expression Expression: Verbal Expression assist level: Expresses complex ideas: With extra time/assistive device  Function - Social Interaction Social Interaction assist level: Interacts appropriately with others with medication or extra time (anti-anxiety, antidepressant).  Function - Problem Solving Problem solving assist level: Solves complex problems: With extra time  Function - Memory Memory assist level: Complete Independence: No helper Patient normally able to recall (first 3 days only): Current season, Location of own room, Staff names and faces, That he or she is in a hospital   Medical Problem List and Plan: 1. Functional deficits secondary to debilitation/Crohn's disease/perforated left colon with peritonitis/exploratory laparotomy  with drainage of intra-abdominal abscess partial colon resection with colostomy. Restarted  Pentasa 500 mg 4 times a day on 10/15 was several days without it -nausea may be Crohn's related, better today, will monitor intake as well as colostomy output 2.  DVT Prophylaxis/Anticoagulation: Subcutaneous heparin. Monitor platelet counts, currently no signs of bleeding 3. Pain Management: Hydrocodone as needed 4. Acute blood loss anemia. Monitor while on heparin, hgb 7.5 on 10/23.  Repeat labs ordered for Monday. Will consider transfusion if values continue to trend down.  5. Neuropsych: This patient is capable of making decisions on her own behalf. 6. Skin/Wound Care: dehiscence noted at [facility]  cutaneous jct of stoma- [facility] seeing pt on M-W-F and WOC RN following, s/p placement of wound vac,  7. Fluids/Electrolytes/Nutrition: Strict I&O with follow-up chemistries  - Labs ordered for Monday  - Poor solid and by mouth fluid intake. IVF started for 12 hours. Encourage patient to take more by mouth fluids. 8. Acute renal insufficiency ; improving, 1.04 on 10/23 . Will continue to monitor periodically. Repeat labs ordered for Monday.   9. Acute respiratory failure/VDRF. Extubated 06/14/2015.propable obesity hypoventilation syndrome, cont PRN O2 10. SVT. Monitor heart rate. Lopressor 50 mg twice a day, pt also severely deconditioned with poor exercise tolerance, am HR 110, cont current dose 11. Chronic systolic congestive heart failure. Monitor for any signs of fluid overload. Lasix 40 mg twice a day, monitor intake and creat, no SOB, sats ok, no change in dose at present 12.  UTI ecoli- s/p treatment recheck UA looks like recurrent UTI afeb, repeat cx e. Coli.  Keflex started 10/21-10/28.   13. Morbid obesity. Body mass index 67.33 14.  Mild hypokalemia - will supplement.  Labs ordered for tomorrow.  15.  Severe hypoalbuminemia- supplement , this is contributing to delayed wound healing , doesn't tolerate  beneprotein but drinks ensure ~3 cans a day.  Discussed with pt importance of nutrition.   15.  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    LOS (Days) 20 A FACE TO FACE EVALUATION WAS PERFORMED  Tinna Kolker Lorie Phenix 07/16/2015, 8:36 AM

## 2015-07-16 NOTE — Progress Notes (Signed)
Pt sleeping off and on, c/o pain Norco 2 tabs given at 118, asleep after 1 hour. Notified MD of lab results at [place], advised it is okay to begin IVF NS @ 30cc/hr. Pt reports feeling better. Cont plan of care. Keondre Markson, Dione Plover

## 2015-07-16 NOTE — Progress Notes (Signed)
Patient has had 2 episodes of vomiting today. Patient denies any nausea prior to emesis episodes. Dr Posey Pronto notified. No new orders at this time. Continue to monitor.

## 2015-07-16 NOTE — Progress Notes (Signed)
Physical Therapy Session Note  Patient Details  Name: Dominique [name] MRN: [id number] Date of Birth: [birth year]  Today's Date: 07/16/2015 PT Individual Time: 1030-1100 PT Individual Time Calculation (min): 30 min   Short Term Goals: Week 3:  PT Short Term Goal 1 (Week 3): Pt will demonstrate dynamic sitting balance with min A for slideboards and functional activities EOB and edge of mat x 10-15 min PT Short Term Goal 2 (Week 3): Pt will perform sit <> stand from elevated mat with appropriate lift equipment and max A and maintain standing 1-2 minutes at a time PT Short Term Goal 3 (Week 3): Pt will perform slideboard w/c <> bed with consistent mod A PT Short Term Goal 4 (Week 3): Pt will perform bed mobility with consistent min A and use of bed controls PT Short Term Goal 5 (Week 3): Pt will perform w/c mobility x 150' in controlled environment with supervision  Skilled Therapeutic Interventions/Progress Updates:    Session focused on BLE strengthening exercises in order to aid with overall mobility and endurance with bed in reverse Trendelenburg position and feet support on footplate for weigthbearing including heel raises 2 x 30  reps, toe raises 2 x 30 reps, mini squats within available range 2 x 30 reps, and single limb marching x 2 x 30 reps each. +2 for repositioning in the bed. Rest breaks as needed.  Therapy Documentation Precautions:  Precautions Precautions: Fall Precaution Comments: abdominal wound vac, colostomy Required Braces or Orthoses: Other Brace/Splint Other Brace/Splint: abdominal binder when OOB Restrictions Weight Bearing Restrictions: No Pain: Pain in abdomen - reports this is per usual.   See Function Navigator for Current Functional Status.   Therapy/Group: Individual Therapy  Canary Brim Ivory Broad, PT, DPT  07/16/2015, 12:12 PM

## 2015-07-16 NOTE — Progress Notes (Signed)
Late entry:  Pt with vomiting x2 without nausea.  Due vomitting as well as BP, HR, UOP recordings.lasix held x2. Will cont to monitor.

## 2015-07-17 ENCOUNTER — Ambulatory Visit (HOSPITAL_COMMUNITY): Admission: RE | Admit: 2015-07-17 | Payer: Medicaid Other | Source: Ambulatory Visit | Admitting: Gastroenterology

## 2015-07-17 ENCOUNTER — Inpatient Hospital Stay (HOSPITAL_COMMUNITY): Payer: Medicaid Other | Admitting: Physical Therapy

## 2015-07-17 ENCOUNTER — Inpatient Hospital Stay (HOSPITAL_COMMUNITY): Payer: Medicaid Other | Admitting: Anesthesiology

## 2015-07-17 ENCOUNTER — Encounter (HOSPITAL_COMMUNITY)
Admission: AD | Disposition: A | Discharge status: Other Acute Inpatient Hospital | Payer: Self-pay | Source: Intra-hospital | Attending: Physical Medicine & Rehabilitation

## 2015-07-17 ENCOUNTER — Inpatient Hospital Stay (HOSPITAL_COMMUNITY): Payer: Medicaid Other

## 2015-07-17 ENCOUNTER — Encounter (HOSPITAL_COMMUNITY): Payer: Self-pay | Admitting: Gastroenterology

## 2015-07-17 LAB — BASIC METABOLIC PANEL
ANION GAP: 9 (ref 5–15)
BUN: 12 mg/dL (ref 6–20)
CHLORIDE: 99 mmol/L — AB (ref 101–111)
CO2: 32 mmol/L (ref 22–32)
CREATININE: 0.91 mg/dL (ref 0.44–1.00)
Calcium: 7.4 mg/dL — ABNORMAL LOW (ref 8.9–10.3)
GFR calc non Af Amer: >60 mL/min (ref >60)
Glucose, Bld: 83 mg/dL (ref 65–99)
Potassium: 4.4 mmol/L (ref 3.5–5.1)
SODIUM: 140 mmol/L (ref 135–145)

## 2015-07-17 LAB — CBC WITH DIFFERENTIAL/PLATELET
BASOS PCT: 0 %
Basophils Absolute: 0 10*3/uL (ref 0.0–0.1)
EOS PCT: 1 %
Eosinophils Absolute: 0.1 10*3/uL (ref 0.0–0.7)
HCT: 27.2 % — ABNORMAL LOW (ref 36.0–46.0)
HEMOGLOBIN: 8 g/dL — AB (ref 12.0–15.0)
LYMPHS PCT: 16 %
Lymphs Abs: 1.9 10*3/uL (ref 0.7–4.0)
MCH: 26.6 pg (ref 26.0–34.0)
MCHC: 29.4 g/dL — ABNORMAL LOW (ref 30.0–36.0)
MCV: 90.4 fL (ref 78.0–100.0)
MONO ABS: 1.8 10*3/uL — AB (ref 0.1–1.0)
MONOS PCT: 15 %
NEUTROS PCT: 68 %
Neutro Abs: 7.9 10*3/uL — ABNORMAL HIGH (ref 1.7–7.7)
PLATELETS: 464 10*3/uL — AB (ref 150–400)
RBC: 3.01 MIL/uL — AB (ref 3.87–5.11)
RDW: 20 % — ABNORMAL HIGH (ref 11.5–15.5)
WBC: 11.7 10*3/uL — AB (ref 4.0–10.5)

## 2015-07-17 LAB — GLUCOSE, CAPILLARY
GLUCOSE-CAPILLARY: 85 mg/dL (ref 65–99)
Glucose-Capillary: 89 mg/dL (ref 65–99)
Glucose-Capillary: 82 mg/dL (ref 65–99)
Glucose-Capillary: 81 mg/dL (ref 65–99)

## 2015-07-17 SURGERY — CANCELLED PROCEDURE
Anesthesia: Monitor Anesthesia Care

## 2015-07-17 MED ORDER — KETAMINE HCL 100 MG/ML IJ SOLN
INTRAMUSCULAR | Status: AC
  Filled 2015-07-17: qty 1

## 2015-07-17 MED ORDER — SODIUM CHLORIDE 0.9 % IV SOLN: INTRAVENOUS | Status: DC

## 2015-07-17 NOTE — Anesthesia Preprocedure Evaluation (Deleted)
Anesthesia Evaluation  Patient identified by MRN, date of birth, ID band Patient awake    Reviewed: Allergy & Precautions, NPO status , Patient's Chart, lab work & pertinent test results  Airway Mallampati: II  TM Distance: >3 FB Neck ROM: Full    Dental no notable dental hx.    Pulmonary sleep apnea , former smoker,    breath sounds clear to auscultation + decreased breath sounds      Cardiovascular +CHF  Normal cardiovascular exam Rhythm:Regular Rate:Normal  - LVEF 20%, diffuse hypokinesis, moderately dilated LV with mild LV wall thickening, paradoxical septal motion, mildly dilated LA, mild TR, RVSP 40 mmHg + RAP, trivial pericardial effusion. Compared to a prior echo in 2011, the EF is now significantly decreased from previously being normal.    Neuro/Psych negative neurological ROS  negative psych ROS   GI/Hepatic negative GI ROS, Neg liver ROS,   Endo/Other  Morbid obesity  Renal/GU negative Renal ROS  negative genitourinary   Musculoskeletal negative musculoskeletal ROS (+)   Abdominal (+) + obese,   Peds negative pediatric ROS (+)  Hematology  (+) anemia ,   Anesthesia Other Findings   Reproductive/Obstetrics negative OB ROS                            Anesthesia Physical Anesthesia Plan  ASA: IV  Anesthesia Plan: MAC   Post-op Pain Management:    Induction: Intravenous  Airway Management Planned: Nasal Cannula  Additional Equipment:   Intra-op Plan:   Post-operative Plan:   Informed Consent: I have reviewed the patients History and Physical, chart, labs and discussed the procedure including the risks, benefits and alternatives for the proposed anesthesia with the patient or authorized representative who has indicated his/her understanding and acceptance.   Dental advisory given  Plan Discussed with: CRNA and Surgeon  Anesthesia Plan Comments:          Anesthesia Quick Evaluation

## 2015-07-17 NOTE — Progress Notes (Signed)
Physical Therapy Session Note  Patient Details  Name: Dominique [name] MRN: [id number] Date of Birth: 16-Apr-1981  Today's Date: 07/17/2015   Skilled Therapeutic Interventions/Progress Updates:   Pt missed 45 minutes of PT due to MD medical hold: Nausea and vomiting and low hgb.  Will continue PT POC when medically cleared to participate.  Therapy Documentation Precautions:  Precautions Precautions: Fall Precaution Comments: abdominal wound vac, colostomy Required Braces or Orthoses: Other Brace/Splint Other Brace/Splint: abdominal binder when OOB Restrictions Weight Bearing Restrictions: No General: PT Amount of Missed Time (min): 45 Minutes PT Missed Treatment Reason: MD hold (Comment) (N&V and low hgb)   Raylene Everts Faucette 07/17/2015, 10:21 AM

## 2015-07-17 NOTE — Progress Notes (Signed)
Occupational Therapy Session Note  Patient Details  Name: Dominique [name] MRN: [id number] Date of Birth: 1981-09-11  Today's Date: 07/17/2015 OT Individual Time: 3953-2023 OT Individual Time Calculation (min): 0 min    Short Term Goals: Week 3:  OT Short Term Goal 1 (Week 3): Pt will complete slide board transfer, w/c <> raised mat, with +2 assist only (no mechanical lift assist support required) OT Short Term Goal 2 (Week 3): Pt will demo ability to direct caregiver(s) with transfer sequence using mechanical lift with min questioning cues OT Short Term Goal 3 (Week 3): Pt will tolerate supported upright sitting posture for 2 consecutive hours with total time in position for at least 4 hours daily. OT Short Term Goal 4 (Week 3): Pt will complete bed mobiity using DME/AE as needed with mod assist  Skilled Therapeutic Interventions/Progress Updates: Pt received supine in bed, alert and responsive with emesis basin placed nearby.   Per MD comment on progress note, assessment 1, therapies on hold this date d/t nausea and vomiting.   No treatment provided.   Pt setup with new orange thera-band to replace lost band from the weekend.     Therapy Documentation Precautions:  Precautions Precautions: Fall Precaution Comments: abdominal wound vac, colostomy Required Braces or Orthoses: Other Brace/Splint Other Brace/Splint: abdominal binder when OOB Restrictions Weight Bearing Restrictions: No  Pain: Pain Assessment Pain Assessment: 0-10 Pain Score: 6  Pain Type: Acute pain Pain Location: Abdomen Pain Descriptors / Indicators: Aching Pain Onset: On-going Pain Intervention(s): Medication (See eMAR)   ADL: ADL ADL Comments: see Functional Assessment  Therapy/Group: Individual Therapy (cancelled)  [place] 07/17/2015, 8:54 AM

## 2015-07-17 NOTE — Progress Notes (Signed)
Subjective/Complaints: Patient seen and examined this morning resting comfortably in bed.  C/o of poor appetite and nausea, has had a couple episodes of vomiting on Sun, dry heaves overnite Discussed pt with RN and Dr Patel who coveredd over the weekend  ROS: Denies CP, SOB, + nausea and vomiting Objective: Vital Signs: Blood pressure 101/45, pulse 98, temperature 98.6 F (37 C), temperature source Oral, resp. rate 20, weight 159.213 kg (351 lb), SpO2 97 %. No results found. Results for orders placed or performed during the hospital encounter of 06/26/15 (from the past 72 hour(s))  Glucose, capillary     Status: None   Collection Time: 07/14/15 11:49 AM  Result Value Ref Range   Glucose-Capillary 93 65 - 99 mg/dL  Glucose, capillary     Status: None   Collection Time: 07/14/15  4:37 PM  Result Value Ref Range   Glucose-Capillary 96 65 - 99 mg/dL   Comment 1 Notify RN   Glucose, capillary     Status: None   Collection Time: 07/14/15  8:47 PM  Result Value Ref Range   Glucose-Capillary 91 65 - 99 mg/dL   Comment 1 Notify RN   Glucose, capillary     Status: Abnormal   Collection Time: 07/15/15  6:44 AM  Result Value Ref Range   Glucose-Capillary 100 (H) 65 - 99 mg/dL   Comment 1 Notify RN   Glucose, capillary     Status: None   Collection Time: 07/15/15 11:52 AM  Result Value Ref Range   Glucose-Capillary 93 65 - 99 mg/dL   Comment 1 Notify RN   Glucose, capillary     Status: None   Collection Time: 07/15/15  4:39 PM  Result Value Ref Range   Glucose-Capillary 93 65 - 99 mg/dL   Comment 1 Notify RN   Glucose, capillary     Status: None   Collection Time: 07/15/15  8:59 PM  Result Value Ref Range   Glucose-Capillary 99 65 - 99 mg/dL   Comment 1 Notify RN   Troponin I     Status: None   Collection Time: 07/15/15 10:29 PM  Result Value Ref Range   Troponin I <0.03 <0.031 ng/mL    Comment:        NO INDICATION OF MYOCARDIAL INJURY.   CK total and CKMB (cardiac)not at  [facility]     Status: Abnormal   Collection Time: 07/15/15 10:29 PM  Result Value Ref Range   Total CK 8 (L) 38 - 234 U/L   CK, MB 1.0 0.5 - 5.0 ng/mL   Relative Index RELATIVE INDEX IS INVALID 0.0 - 2.5    Comment: WHEN CK < 100 U/L          Basic metabolic panel     Status: Abnormal   Collection Time: 07/16/15  4:50 AM  Result Value Ref Range   Sodium 138 135 - 145 mmol/L   Potassium 3.6 3.5 - 5.1 mmol/L   Chloride 97 (L) 101 - 111 mmol/L   CO2 33 (H) 22 - 32 mmol/L   Glucose, Bld 88 65 - 99 mg/dL   BUN 15 6 - 20 mg/dL   Creatinine, Ser 1.04 (H) 0.44 - 1.00 mg/dL   Calcium 7.3 (L) 8.9 - 10.3 mg/dL   GFR calc non Af Amer >60 >60 mL/min   GFR calc Af Amer >60 >60 mL/min    Comment: (NOTE) The eGFR has been calculated using the CKD EPI equation. This calculation has not been   validated in all clinical situations. eGFR's persistently <60 mL/min signify possible Chronic Kidney Disease.    Anion gap 8 5 - 15  CBC     Status: Abnormal   Collection Time: 07/16/15  4:50 AM  Result Value Ref Range   WBC 11.1 (H) 4.0 - 10.5 K/uL   RBC 2.86 (L) 3.87 - 5.11 MIL/uL   Hemoglobin 7.5 (L) 12.0 - 15.0 g/dL   HCT 25.3 (L) 36.0 - 46.0 %   MCV 88.5 78.0 - 100.0 fL   MCH 26.2 26.0 - 34.0 pg   MCHC 29.6 (L) 30.0 - 36.0 g/dL   RDW 20.6 (H) 11.5 - 15.5 %   Platelets 462 (H) 150 - 400 K/uL  Glucose, capillary     Status: None   Collection Time: 07/16/15  6:53 AM  Result Value Ref Range   Glucose-Capillary 90 65 - 99 mg/dL   Comment 1 Notify RN   Glucose, capillary     Status: None   Collection Time: 07/16/15 11:24 AM  Result Value Ref Range   Glucose-Capillary 93 65 - 99 mg/dL   Comment 1 Notify RN   Glucose, capillary     Status: None   Collection Time: 07/16/15  4:38 PM  Result Value Ref Range   Glucose-Capillary 92 65 - 99 mg/dL   Comment 1 Notify RN   Glucose, capillary     Status: None   Collection Time: 07/16/15  8:46 PM  Result Value Ref Range   Glucose-Capillary 90 65 - 99 mg/dL      HEENT: Normocephalic, atraumatic Cardio: RRR and no murmur Resp: CTA B/L and unlabored GI: BS positive and LLQ colostomy, midline open abd wound with wound vac Skin:   Wound see above, no heel ulceration. VAC suctioning without leaks Neuro: Alert/Oriented  Sensation intact to light touch Musc/Skel:  No tenderness of extremities No Edema Antigravity strength throughout, except for b/l hip flexors Gen NAD. Vital signs reviewed   Assessment/Plan: 1. Functional deficits secondary to debilitation/Crohn's disease/perforated left colon with peritonitis/exploratory laparotomy with drainage of intra-abdominal abscess partial colon resection with colostomy which require 3+ hours per day of interdisciplinary therapy in a comprehensive inpatient rehab setting. Physiatrist is providing close team supervision and 24 hour management of active medical problems listed below. Physiatrist and rehab team continue to assess barriers to discharge/monitor patient progress toward functional and medical goals.  Hold therapy today for N/V  FIM: Function - Bathing Position: Other (comment) (Edge of bed for upper body and supine/HOB elevated for lower) Body parts bathed by patient: Right arm, Left arm, Chest, Abdomen, Right upper leg, Left upper leg Body parts bathed by helper: Left lower leg, Right lower leg, Front perineal area Bathing not applicable: Abdomen, Buttocks, Front perineal area (peri care done prior to OT session) Assist Level: Touching or steadying assistance(Pt > 75%)  Function- Upper Body Dressing/Undressing What is the patient wearing?: Hospital gown Bra - Perfomed by patient: Thread/unthread right bra strap, Thread/unthread left bra strap Bra - Perfomed by helper: Hook/unhook bra (pull down sports bra) Pull over shirt/dress - Perfomed by patient: Thread/unthread right sleeve, Thread/unthread left sleeve Pull over shirt/dress - Perfomed by helper: Pull shirt over trunk Assist Level:  Touching or steadying assistance(Pt > 75%) Set up : To obtain clothing/put away Function - Lower Body Dressing/Undressing What is the patient wearing?: Non-skid slipper socks Position: Bed Pants- Performed by patient: Pull pants up/down Pants- Performed by helper: Thread/unthread right pants leg, Thread/unthread left pants leg, Pull pants   up/down Non-skid slipper socks- Performed by patient: Don/doff right sock, Don/doff left sock Non-skid slipper socks- Performed by helper: Don/doff right sock, Don/doff left sock Assist for lower body dressing: Touching or steadying assistance (Pt > 75%)  Function - Toileting Toileting activity did not occur: Safety/medical concerns Toileting steps completed by patient: Adjust clothing prior to toileting, Performs perineal hygiene, Adjust clothing after toileting Toileting steps completed by helper: Adjust clothing prior to toileting, Performs perineal hygiene, Adjust clothing after toileting Assist level: Touching or steadying assistance (Pt.75%)  Function - Toilet Transfers Toilet transfer activity did not occur: Safety/medical concerns Toilet transfer assistive device: Mechanical lift Mechanical lift: [place] Assist level to toilet: 2 helpers Assist level from toilet: 2 helpers  Function - Chair/bed transfer Chair/bed transfer activity did not occur: Safety/medical concerns Chair/bed transfer method: Other Chair/bed transfer assist level: 2 helpers Chair/bed transfer assistive device: Mechanical lift Mechanical lift: [place] Chair/bed transfer details: Verbal cues for safe use of DME/AE, Manual facilitation for placement, Verbal cues for precautions/safety  Function - Locomotion: Wheelchair Will patient use wheelchair at discharge?: Yes Type: Manual Wheelchair activity did not occur: Safety/medical concerns (debility and awaiting appropriate equipment) Max wheelchair distance: 78' Assist Level: Touching or steadying assistance (Pt >  75%) Assist Level: Moderate assistance (Pt 50 - 74%) Assist Level: Dependent (Pt equals 0%) Turns around,maneuvers to table,bed, and toilet,negotiates 3% grade,maneuvers on rugs and over doorsills: No Function - Locomotion: Ambulation Ambulation activity did not occur: Safety/medical concerns (significant debility)  Function - Comprehension Comprehension: Auditory Comprehension assist level: Understands complex 90% of the time/cues 10% of the time  Function - Expression Expression: Verbal Expression assist level: Expresses complex ideas: With no assist  Function - Social Interaction Social Interaction assist level: Interacts appropriately with others with medication or extra time (anti-anxiety, antidepressant).  Function - Problem Solving Problem solving assist level: Solves complex problems: With extra time  Function - Memory Memory assist level: Assistive device: No helper Patient normally able to recall (first 3 days only): Current season, Location of own room, Staff names and faces, That he or she is in a hospital   Medical Problem List and Plan: 1. Functional deficits secondary to debilitation/Crohn's disease/perforated left colon with peritonitis/exploratory laparotomy with drainage of intra-abdominal abscess partial colon resection with colostomy. Restarted  Pentasa 500 mg 4 times a day on 10/15 was several days without it -nausea may be Crohn's related, better today, will monitor intake as well as colostomy output, ask GI to re eval, ? If pt needs to be on steroids, this has been avoided in part due to attempts at wound healing post colostomy 2.  DVT Prophylaxis/Anticoagulation: Subcutaneous heparin. Monitor platelet counts, currently no signs of bleeding 3. Pain Management: Hydrocodone as needed 4. Acute blood loss anemia. Monitor while on heparin, hgb 7.5 on 10/23.  Repeat labs ordered for 10/24. Will consider transfusion if values continue to trend down.  5. Neuropsych:  This patient is capable of making decisions on her own behalf. 6. Skin/Wound Care: dehiscence noted at muco cutaneous jct of stoma- CCS seeing pt on M-W-F and WOC RN following, s/p placement of wound vac,  7. Fluids/Electrolytes/Nutrition: Strict I&O with follow-up chemistries  -  - Poor solid and by mouth fluid intake. IVF over weekend but will await repeat labs prior to new order, has cardiomyopathy so need to be judicious 8. Acute renal insufficiency ; improving, 1.04 on 10/23 . Will continue to monitor periodically. Repeat labs ordered for Monday.   9. Acute respiratory failure/VDRF.  Extubated 06/14/2015.propable obesity hypoventilation syndrome, cont PRN O2 10. SVT. Monitor heart rate. Lopressor 50 mg twice a day, pt also severely deconditioned with poor exercise tolerance, am HR 110, cont current dose 11. Chronic systolic congestive heart failure. Monitor for any signs of fluid overload. Lasix 40 mg twice a day, monitor intake and creat, no SOB, sats ok, no change in dose at present 12.  UTI ecoli- s/p treatment recheck UA looks like recurrent UTI afeb, repeat cx e. Coli.  Keflex started 10/21-10/28.   13. Morbid obesity. Body mass index 67.33 14.  Mild hypokalemia - will supplement.  Monitor for worsening with recent vomiting 15.  Severe hypoalbuminemia- supplement , this is contributing to delayed wound healing , doesn't tolerate beneprotein but drinks ensure ~3 cans a day.  Discussed with pt importance of nutrition.   15.  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    LOS (Days) 21 A FACE TO FACE EVALUATION WAS PERFORMED  Mumtaz Lovins E 07/17/2015, 7:43 AM

## 2015-07-17 NOTE — Consult Note (Signed)
WOC follow-up: Ostomy pouching: Ostomy pouch changed today related to leakage behind barrier.Applied 2 piece pouch with barrier ring. Stoma is 100% red and moist, slightly above skin level, 1 1/2 inches, bleeding slightly, mucutaneous separation occurring to 50% of peristomal edges, from 6:00 o'clock to 12:00 o'clock with mod amt stool in the peristomal wound. Red granulating tissue underneath when stool is absorbed. Mod amt amt semi-formed brown stool in pouch. Education provided: Demonstrated pouch change using 2 piece pouch and a barrier ring to attempt to maintain seal. Applied strip of Aquacel packing into separation edges of stoma. Enrolled patient in [place] program: No Ostomy supplies and instructions at the bedside for nurse use. Pt watched procedure and asked appropriate questions. She will need total assistance with pouching upon discharge until this complex problem is resolved.  Wound type: Full thickness midline abd wound, CCS following for assessment and plan of care; Changed Vac dressing, mod amt reddish-tan drainage in the cannister, no further strong odor. Full thickness post-op wound 95% red, 5% yellow slough in patchy areas. Applied Mepitel contact layer, then one piece of white foam to lower inner wound, then one piece of black foam to 156m cont suction. Pt tolerated with mod amt discomfort. Plan to change dressing on M/W/F at 0800.  DJulien GirtMSN, RN, CWOCN, CWCN-AP, CNS

## 2015-07-17 NOTE — Progress Notes (Signed)
Pt has refused use of cpap at this time but knows it is available if decides differently.  RT will continue to monitor.

## 2015-07-17 NOTE — Consult Note (Signed)
Reason for Consult: Nausea vomiting Referring Physician: Rehabilitation team  Dominique [name] is an 34 y.o. female.  HPI: Patient seen and examined and her hospital computer chart and our office computer chart was reviewed and she has not been eating much since her surgery and has had increased problems with liquids lately and supposedly she does sit upright when she eats and all of her GI issues seem to have started in February but she has not had an endoscopy and her family history is negative for any GI problems and she has no new complaints  Past Medical History  Diagnosis Date  . Asthma   . Sleep apnea   . Anemia   . Morbid obesity ([place]) 03/22/2009    Qualifier: Diagnosis of  By: Ronnald Ramp CMA, [place]    . OSA (obstructive sleep apnea) 05/02/2014  . Thyromegaly 05/02/2014  . Seasonal allergies 06/13/2014  . Acute acalculous cholecystitis 11/16/2014  . Diverticulitis of colon 11/17/2014  . Hepatic steatosis 11/17/2014    Past Surgical History  Procedure Laterality Date  . C section 2011  2011  . Flexible sigmoidoscopy N/A 06/05/2015    Procedure: FLEXIBLE SIGMOIDOSCOPY;  Surgeon: Ronald Lobo, MD;  Location: [place];  Service: Endoscopy;  Laterality: N/A;  . Laparotomy N/A 06/09/2015    Procedure: EXPLORATORY LAPAROTOMY, DRAINAGE OF INTRAABDOMINAL ABSCESS, PARTIAL COLON RESECTION,  APPLICATION OF WOUND VAC;  Surgeon: Georganna Skeans, MD;  Location: [place];  Service: General;  Laterality: N/A;  . Laparotomy N/A 06/11/2015    Procedure: RE-EXPLORATION OF ABDOMEN;  Surgeon: Georganna Skeans, MD;  Location: [place];  Service: General;  Laterality: N/A;  . Colostomy N/A 06/11/2015    Procedure:  CREATION OF COLOSTOMY;  Surgeon: Georganna Skeans, MD;  Location: [facility] OR;  Service: General;  Laterality: N/A;    Family History  Problem Relation Age of Onset  . Hypertension Mother   . Hypertension Father   . Hypertension Sister   . Asthma Brother   . Diabetes Maternal Grandmother   . Hypertension  Maternal Grandmother   . Hypertension Maternal Grandfather   . Cancer Paternal Grandmother     lung  . Emphysema Paternal Grandfather   . Allergies Mother     Social History:  reports that she has quit smoking. Her smoking use included Cigarettes. She started smoking about a year ago. She has a 2 pack-year smoking history. She has never used smokeless tobacco. She reports that she does not drink alcohol or use illicit drugs.  Allergies:  Allergies  Allergen Reactions  . Other Shortness Of Breath and Swelling    Tree nuts  . Penicillins Other (See Comments)    Unknown childhood allergy    Medications: I have reviewed the patient's current medications.  Results for orders placed or performed during the hospital encounter of 06/26/15 (from the past 48 hour(s))  Glucose, capillary     Status: None   Collection Time: 07/15/15  4:39 PM  Result Value Ref Range   Glucose-Capillary 93 65 - 99 mg/dL   Comment 1 Notify RN   Glucose, capillary     Status: None   Collection Time: 07/15/15  8:59 PM  Result Value Ref Range   Glucose-Capillary 99 65 - 99 mg/dL   Comment 1 Notify RN   Troponin I     Status: None   Collection Time: 07/15/15 10:29 PM  Result Value Ref Range   Troponin I <0.03 <0.031 ng/mL    Comment:  NO INDICATION OF MYOCARDIAL INJURY.   CK total and CKMB (cardiac)not at [facility]     Status: Abnormal   Collection Time: 07/15/15 10:29 PM  Result Value Ref Range   Total CK 8 (L) 38 - 234 U/L   CK, MB 1.0 0.5 - 5.0 ng/mL   Relative Index RELATIVE INDEX IS INVALID 0.0 - 2.5    Comment: WHEN CK < 100 U/L          Basic metabolic panel     Status: Abnormal   Collection Time: 07/16/15  4:50 AM  Result Value Ref Range   Sodium 138 135 - 145 mmol/L   Potassium 3.6 3.5 - 5.1 mmol/L   Chloride 97 (L) 101 - 111 mmol/L   CO2 33 (H) 22 - 32 mmol/L   Glucose, Bld 88 65 - 99 mg/dL   BUN 15 6 - 20 mg/dL   Creatinine, Ser 1.04 (H) 0.44 - 1.00 mg/dL   Calcium 7.3 (L) 8.9 -  10.3 mg/dL   GFR calc non Af Amer >60 >60 mL/min   GFR calc Af Amer >60 >60 mL/min    Comment: (NOTE) The eGFR has been calculated using the CKD EPI equation. This calculation has not been validated in all clinical situations. eGFR's persistently <60 mL/min signify possible Chronic Kidney Disease.    Anion gap 8 5 - 15  CBC     Status: Abnormal   Collection Time: 07/16/15  4:50 AM  Result Value Ref Range   WBC 11.1 (H) 4.0 - 10.5 K/uL   RBC 2.86 (L) 3.87 - 5.11 MIL/uL   Hemoglobin 7.5 (L) 12.0 - 15.0 g/dL   HCT 25.3 (L) 36.0 - 46.0 %   MCV 88.5 78.0 - 100.0 fL   MCH 26.2 26.0 - 34.0 pg   MCHC 29.6 (L) 30.0 - 36.0 g/dL   RDW 20.6 (H) 11.5 - 15.5 %   Platelets 462 (H) 150 - 400 K/uL  Glucose, capillary     Status: None   Collection Time: 07/16/15  6:53 AM  Result Value Ref Range   Glucose-Capillary 90 65 - 99 mg/dL   Comment 1 Notify RN   Glucose, capillary     Status: None   Collection Time: 07/16/15 11:24 AM  Result Value Ref Range   Glucose-Capillary 93 65 - 99 mg/dL   Comment 1 Notify RN   Glucose, capillary     Status: None   Collection Time: 07/16/15  4:38 PM  Result Value Ref Range   Glucose-Capillary 92 65 - 99 mg/dL   Comment 1 Notify RN   Glucose, capillary     Status: None   Collection Time: 07/16/15  8:46 PM  Result Value Ref Range   Glucose-Capillary 90 65 - 99 mg/dL  Basic metabolic panel     Status: Abnormal   Collection Time: 07/17/15  4:26 AM  Result Value Ref Range   Sodium 140 135 - 145 mmol/L   Potassium 4.4 3.5 - 5.1 mmol/L    Comment: DELTA CHECK NOTED   Chloride 99 (L) 101 - 111 mmol/L   CO2 32 22 - 32 mmol/L   Glucose, Bld 83 65 - 99 mg/dL   BUN 12 6 - 20 mg/dL   Creatinine, Ser 0.91 0.44 - 1.00 mg/dL   Calcium 7.4 (L) 8.9 - 10.3 mg/dL   GFR calc non Af Amer >60 >60 mL/min   GFR calc Af Amer >60 >60 mL/min    Comment: (NOTE) The eGFR has  been calculated using the CKD EPI equation. This calculation has not been validated in all clinical  situations. eGFR's persistently <60 mL/min signify possible Chronic Kidney Disease.    Anion gap 9 5 - 15  Glucose, capillary     Status: None   Collection Time: 07/17/15  6:42 AM  Result Value Ref Range   Glucose-Capillary 89 65 - 99 mg/dL  CBC with Differential/Platelet     Status: Abnormal   Collection Time: 07/17/15  6:46 AM  Result Value Ref Range   WBC 11.7 (H) 4.0 - 10.5 K/uL   RBC 3.01 (L) 3.87 - 5.11 MIL/uL   Hemoglobin 8.0 (L) 12.0 - 15.0 g/dL   HCT 27.2 (L) 36.0 - 46.0 %   MCV 90.4 78.0 - 100.0 fL   MCH 26.6 26.0 - 34.0 pg   MCHC 29.4 (L) 30.0 - 36.0 g/dL   RDW 20.0 (H) 11.5 - 15.5 %   Platelets 464 (H) 150 - 400 K/uL   Neutrophils Relative % 68 %   Lymphocytes Relative 16 %   Monocytes Relative 15 %   Eosinophils Relative 1 %   Basophils Relative 0 %   Neutro Abs 7.9 (H) 1.7 - 7.7 K/uL   Lymphs Abs 1.9 0.7 - 4.0 K/uL   Monocytes Absolute 1.8 (H) 0.1 - 1.0 K/uL   Eosinophils Absolute 0.1 0.0 - 0.7 K/uL   Basophils Absolute 0.0 0.0 - 0.1 K/uL   RBC Morphology STOMATOCYTES     Comment: POLYCHROMASIA PRESENT  Glucose, capillary     Status: None   Collection Time: 07/17/15 11:24 AM  Result Value Ref Range   Glucose-Capillary 82 65 - 99 mg/dL    No results found.  ROS negative except above her ostomy seems to be working fine Blood pressure 101/45, pulse 98, temperature 98.6 F (37 C), temperature source Oral, resp. rate 20, weight 159.213 kg (351 lb), SpO2 97 %. Physical Exam vital signs stable afebrile no acute distress she does have some tenderness in her abdomen around the surgical scar labs and x-rays reviewed rest of exam unremarkable Assessment/Plan: Postop slowly to recover with some nausea and vomiting Plan: The risks benefits methods and options of endoscopy was discussed with the patient and will proceed later today since she has not had anything to eat with further workup and plans pending those findings  Chapman E 07/17/2015, 12:27 PM

## 2015-07-17 NOTE — Progress Notes (Signed)
Unfortunately she did not have a working IV and anesthesia thinks she needs a PICC line they were unable to start an IV here so we will schedule for tomorrow and proceed with her endoscopy on Wednesday and will check on tomorrow

## 2015-07-17 NOTE — Discharge Instructions (Signed)
Inpatient Rehab Discharge Instructions  Dominique [name] Discharge date and time: No discharge date for patient encounter.   Activities/Precautions/ Functional Status: Activity: activity as tolerated Diet: Soft Wound Care: keep wound clean and dry Functional status:  ___ No restrictions     ___ Walk up steps independently ___ 24/7 supervision/assistance   ___ Walk up steps with assistance ___ Intermittent supervision/assistance  ___ Bathe/dress independently ___ Walk with walker     ___ Bathe/dress with assistance ___ Walk Independently    ___ Shower independently ___ Walk with assistance    ___ Shower with assistance ___ No alcohol     ___ Return to work/school ________  Special Instructions:    My questions have been answered and I understand these instructions. I will adhere to these goals and the provided educational materials after my discharge from the hospital.  Patient/Caregiver Signature _______________________________ Date __________  Clinician Signature _______________________________________ Date __________  Please bring this form and your medication list with you to all your follow-up doctor's appointments.

## 2015-07-17 NOTE — Progress Notes (Signed)
Patient with nausea, vomiting x 1, and stomach pain at HS. Patient given zofran 2m via IV at 2008 prior to bedtime medications. Patient able to tolerate medications after zofran. This AM patient with nausea, vomiting, dry heves, and stomach pain. Zofran given at 0610 prior to Am medications. adm

## 2015-07-18 ENCOUNTER — Inpatient Hospital Stay (HOSPITAL_COMMUNITY): Payer: Medicaid Other

## 2015-07-18 ENCOUNTER — Inpatient Hospital Stay (HOSPITAL_COMMUNITY): Payer: Medicaid Other | Admitting: Physical Therapy

## 2015-07-18 LAB — GLUCOSE, CAPILLARY
GLUCOSE-CAPILLARY: 89 mg/dL (ref 65–99)
GLUCOSE-CAPILLARY: 80 mg/dL (ref 65–99)
GLUCOSE-CAPILLARY: 80 mg/dL (ref 65–99)
Glucose-Capillary: 73 mg/dL (ref 65–99)

## 2015-07-18 MED ORDER — SODIUM CHLORIDE 0.45 % IV SOLN
INTRAVENOUS | Status: DC
  Administered 2015-07-18 – 2015-07-20 (×2): via INTRAVENOUS

## 2015-07-18 MED ORDER — SODIUM CHLORIDE 0.9 % IJ SOLN
10.0000 mL | INTRAMUSCULAR | Status: DC | PRN
  Administered 2015-07-20 (×2): 10 mL
  Filled 2015-07-18 (×2): qty 40

## 2015-07-18 NOTE — Progress Notes (Signed)
Pt continuing to refuse cpap at this time.

## 2015-07-18 NOTE — Progress Notes (Signed)
Occupational Therapy Session Note  Patient Details  Name: Dominique [name] MRN: [id number] Date of Birth: 04/09/1981  Today's Date: 07/18/2015 OT Individual Time: 0830-1000 OT Individual Time Calculation (min): 90 min    Short Term Goals: Week 3:  OT Short Term Goal 1 (Week 3): Pt will complete slide board transfer, w/c <> raised mat, with +2 assist only (no mechanical lift assist support required) OT Short Term Goal 2 (Week 3): Pt will demo ability to direct caregiver(s) with transfer sequence using mechanical lift with min questioning cues OT Short Term Goal 3 (Week 3): Pt will tolerate supported upright sitting posture for 2 consecutive hours with total time in position for at least 4 hours daily. OT Short Term Goal 4 (Week 3): Pt will complete bed mobiity using DME/AE as needed with mod assist  Skilled Therapeutic Interventions/Progress Updates: ADL-retraining at bed level with emphasis on emotional support, increased awareness, and motivational interviewing to assist pt with re-engaging in treatments.   Pt received supine in bed, tearful and anxious relating to her perceived physical deterioration.   Pt endorses symptoms of despair and hopelessness, admitting to no motivation to participate.   After prolonged discussion on need to maintain skin integrity and muscle function through mobility, pt participates in assisted bed level BADL.   Pt requires re-ed on bed mobility sequence but initiates rolling left and right with counting "1-2-3" and reaches to therapist to assist with lifting trunk.   OT provides placement to facilitate advancing both legs to side lying as +2 helper advances pt's breast and supports trunk.   OT provides total assist to wash lower body at bed level and mod assist for upper body after mech lift transfer to shuttle chair.     OT notes pt improves participation when pt is distractied  from medically based dialog to more personal/historical discussion relating to her life as a  child Geophysical data processor dependent status in [place] and [place]).    Therapy Documentation Precautions:  Precautions Precautions: Fall Precaution Comments: abdominal wound vac, colostomy Required Braces or Orthoses: Other Brace/Splint Other Brace/Splint: abdominal binder when OOB Restrictions Weight Bearing Restrictions: No  Pain: Pain Assessment Pain Score: 7   ADL: ADL ADL Comments: see Functional Assessment  See Function Navigator for Current Functional Status.   Therapy/Group: Individual Therapy   Second session: Time: 1400-1430 Time Calculation (min):  30 min  Pain Assessment: 8/10, stomach spasms, intermittent  Skilled Therapeutic Interventions: Therapeutic exercises with focus on BUE strengthening.  Pt received supine in bed but willing to perform UE strengthening exercises.   Pt provided orange theraband with OT demonstrating (re-ed) on exercises.   OT notes pt's performance gradually degrades during scapular chest pull and OT revises treatment to manually resisted AROM versus progressive resistance exercises.    Muscle strength for right shoulder flexion/extension is no greater than 3+/5 (minimal resistance) which is inadequate to aid pt with bed mobility.   Pt appears to provide vigorous resistance during 7th repetition and then reports severe stomach spasms with need for rest.   Pt left in bed at end of session with all needs within reach.  See FIM for current functional status  Therapy/Group: Individual Therapy  [place] 07/18/2015, 12:47 PM

## 2015-07-18 NOTE — Progress Notes (Addendum)
Nutrition Follow Up  DOCUMENTATION CODES:   Morbid obesity  INTERVENTION:   Continue Ensure Enlive po TID, each supplement provides 350 kcal and 20 grams of protein.  Provide Magic cup between meals, each supplement provides 290 kcal and 9 grams of protein.  Encourage adequate PO intake.   If intake continues to be poor, may need consideration of enteral nutrition to provide adequate nutritional needs.   NUTRITION DIAGNOSIS:   Increased nutrient needs related to wound healing as evidenced by estimated needs; ongoing  GOAL:   Patient will meet greater than or equal to 90% of their needs; not met  MONITOR:   PO intake, Supplement acceptance, Weight trends, Labs, I & O's  REASON FOR ASSESSMENT:   Consult Poor PO  ASSESSMENT:   34 y.o. right handed morbidly obese female admitted 06/04/2015 with noted history of asthma, chronic systolic congestive heart failure and recent recurrent colitis since January 2016. Underwent sigmoidoscopy 06/05/2015 showing severe chronic inflammatory changes in the proximal sigmoid and descending colon. Biopsies diagnosed with Crohn disease  Underwent exploratory laparotomy drainage of intra-abdominal abscess partial colon resection application of wound VAC 06/09/2015 per Dr. Grandville Silos followed by reexploration of abdomen with closure creation of colostomy 06/11/2015  Meal completion has been 0-50%. Pt with poor appetite. Pt has been experiencing n/v. Pt currently has Ensure ordered and usually has been consuming them, however over the past couple of days pt has been refusing the supplements. Plans for endoscopy tomorrow. If po intake continues to be poor, recommend enteral nutrition to aid in adequate nutritional needs. RD to continue to monitor.   Diet Order:  DIET SOFT Room service appropriate?: Yes; Fluid consistency:: Thin  Skin:  Wound (see comment) (Incision on abdomen, wound VAC)  Last BM:  10/25 ostomy  Height:   Ht Readings from Last 1  Encounters:  06/03/15 _0  (1.575 m)    Weight:   Wt Readings from Last 1 Encounters:  07/18/15 334 lb (151.501 kg)    Ideal Body Weight:  50 kg  BMI:  Body mass index is 61.07 kg/(m^2).  Estimated Nutritional Needs:   Kcal:  2200-2400  Protein:  120-130 grams  Fluid:  2.2 - 2.4 L/day  EDUCATION NEEDS:   Education needs addressed  Corrin Parker, MS, RD, LDN Pager # [phone] After hours/ weekend pager # [phone]

## 2015-07-18 NOTE — Plan of Care (Signed)
Problem: RH Balance Goal: LTG Patient will maintain dynamic standing balance (PT) LTG: Patient will maintain dynamic standing balance with assistance during mobility activities (PT)  Downgraded due to slow progress; 07/18/15  Problem: RH Bed Mobility Goal: LTG Patient will perform bed mobility with assist (PT) LTG: Patient will perform bed mobility with assistance, with/without cues (PT).  Downgraded due to slow progress; 07/18/15  Problem: RH Bed to Chair Transfers Goal: LTG Patient will perform bed/chair transfers w/assist (PT) LTG: Patient will perform bed/chair transfers with assistance, with/without cues (PT).  Downgraded due to slow progress; 07/18/15  Problem: RH Car Transfers Goal: LTG Patient will perform car transfers with assist (PT) LTG: Patient will perform car transfers with assistance (PT).  Outcome: Not Applicable Date Met:  32/02/33 D/C 07/18/15; unsafe at this time

## 2015-07-18 NOTE — Progress Notes (Signed)
Subjective/Complaints: Patient seen and examined this morning resting comfortably in bed.  C/o of poor appetite and nausea, has had a couple episodes of vomiting on Sun, dry heaves overnite Discussed pt with RN and Dr Posey Pronto who coveredd over the weekend  ROS: Denies CP, SOB, + nausea and vomiting Objective: Vital Signs: Blood pressure 101/49, pulse 94, temperature 98.4 F (36.9 C), temperature source Oral, resp. rate 17, weight 151.501 kg (334 lb), SpO2 95 %. No results found. Results for orders placed or performed during the hospital encounter of 06/26/15 (from the past 72 hour(s))  Glucose, capillary     Status: None   Collection Time: 07/15/15 11:52 AM  Result Value Ref Range   Glucose-Capillary 93 65 - 99 mg/dL   Comment 1 Notify RN   Glucose, capillary     Status: None   Collection Time: 07/15/15  4:39 PM  Result Value Ref Range   Glucose-Capillary 93 65 - 99 mg/dL   Comment 1 Notify RN   Glucose, capillary     Status: None   Collection Time: 07/15/15  8:59 PM  Result Value Ref Range   Glucose-Capillary 99 65 - 99 mg/dL   Comment 1 Notify RN   Troponin I     Status: None   Collection Time: 07/15/15 10:29 PM  Result Value Ref Range   Troponin I <0.03 <0.031 ng/mL    Comment:        NO INDICATION OF MYOCARDIAL INJURY.   CK total and CKMB (cardiac)not at [facility]     Status: Abnormal   Collection Time: 07/15/15 10:29 PM  Result Value Ref Range   Total CK 8 (L) 38 - 234 U/L   CK, MB 1.0 0.5 - 5.0 ng/mL   Relative Index RELATIVE INDEX IS INVALID 0.0 - 2.5    Comment: WHEN CK < 100 U/L          Basic metabolic panel     Status: Abnormal   Collection Time: 07/16/15  4:50 AM  Result Value Ref Range   Sodium 138 135 - 145 mmol/L   Potassium 3.6 3.5 - 5.1 mmol/L   Chloride 97 (L) 101 - 111 mmol/L   CO2 33 (H) 22 - 32 mmol/L   Glucose, Bld 88 65 - 99 mg/dL   BUN 15 6 - 20 mg/dL   Creatinine, Ser 1.04 (H) 0.44 - 1.00 mg/dL   Calcium 7.3 (L) 8.9 - 10.3 mg/dL   GFR calc  non Af Amer >60 >60 mL/min   GFR calc Af Amer >60 >60 mL/min    Comment: (NOTE) The eGFR has been calculated using the CKD EPI equation. This calculation has not been validated in all clinical situations. eGFR's persistently <60 mL/min signify possible Chronic Kidney Disease.    Anion gap 8 5 - 15  CBC     Status: Abnormal   Collection Time: 07/16/15  4:50 AM  Result Value Ref Range   WBC 11.1 (H) 4.0 - 10.5 K/uL   RBC 2.86 (L) 3.87 - 5.11 MIL/uL   Hemoglobin 7.5 (L) 12.0 - 15.0 g/dL   HCT 25.3 (L) 36.0 - 46.0 %   MCV 88.5 78.0 - 100.0 fL   MCH 26.2 26.0 - 34.0 pg   MCHC 29.6 (L) 30.0 - 36.0 g/dL   RDW 20.6 (H) 11.5 - 15.5 %   Platelets 462 (H) 150 - 400 K/uL  Glucose, capillary     Status: None   Collection Time: 07/16/15  6:53 AM  Result Value Ref Range   Glucose-Capillary 90 65 - 99 mg/dL   Comment 1 Notify RN   Glucose, capillary     Status: None   Collection Time: 07/16/15 11:24 AM  Result Value Ref Range   Glucose-Capillary 93 65 - 99 mg/dL   Comment 1 Notify RN   Glucose, capillary     Status: None   Collection Time: 07/16/15  4:38 PM  Result Value Ref Range   Glucose-Capillary 92 65 - 99 mg/dL   Comment 1 Notify RN   Glucose, capillary     Status: None   Collection Time: 07/16/15  8:46 PM  Result Value Ref Range   Glucose-Capillary 90 65 - 99 mg/dL  Basic metabolic panel     Status: Abnormal   Collection Time: 07/17/15  4:26 AM  Result Value Ref Range   Sodium 140 135 - 145 mmol/L   Potassium 4.4 3.5 - 5.1 mmol/L    Comment: DELTA CHECK NOTED   Chloride 99 (L) 101 - 111 mmol/L   CO2 32 22 - 32 mmol/L   Glucose, Bld 83 65 - 99 mg/dL   BUN 12 6 - 20 mg/dL   Creatinine, Ser 0.91 0.44 - 1.00 mg/dL   Calcium 7.4 (L) 8.9 - 10.3 mg/dL   GFR calc non Af Amer >60 >60 mL/min   GFR calc Af Amer >60 >60 mL/min    Comment: (NOTE) The eGFR has been calculated using the CKD EPI equation. This calculation has not been validated in all clinical situations. eGFR's  persistently <60 mL/min signify possible Chronic Kidney Disease.    Anion gap 9 5 - 15  Glucose, capillary     Status: None   Collection Time: 07/17/15  6:42 AM  Result Value Ref Range   Glucose-Capillary 89 65 - 99 mg/dL  CBC with Differential/Platelet     Status: Abnormal   Collection Time: 07/17/15  6:46 AM  Result Value Ref Range   WBC 11.7 (H) 4.0 - 10.5 K/uL   RBC 3.01 (L) 3.87 - 5.11 MIL/uL   Hemoglobin 8.0 (L) 12.0 - 15.0 g/dL   HCT 27.2 (L) 36.0 - 46.0 %   MCV 90.4 78.0 - 100.0 fL   MCH 26.6 26.0 - 34.0 pg   MCHC 29.4 (L) 30.0 - 36.0 g/dL   RDW 20.0 (H) 11.5 - 15.5 %   Platelets 464 (H) 150 - 400 K/uL   Neutrophils Relative % 68 %   Lymphocytes Relative 16 %   Monocytes Relative 15 %   Eosinophils Relative 1 %   Basophils Relative 0 %   Neutro Abs 7.9 (H) 1.7 - 7.7 K/uL   Lymphs Abs 1.9 0.7 - 4.0 K/uL   Monocytes Absolute 1.8 (H) 0.1 - 1.0 K/uL   Eosinophils Absolute 0.1 0.0 - 0.7 K/uL   Basophils Absolute 0.0 0.0 - 0.1 K/uL   RBC Morphology STOMATOCYTES     Comment: POLYCHROMASIA PRESENT  Glucose, capillary     Status: None   Collection Time: 07/17/15 11:24 AM  Result Value Ref Range   Glucose-Capillary 82 65 - 99 mg/dL  Glucose, capillary     Status: None   Collection Time: 07/17/15  4:42 PM  Result Value Ref Range   Glucose-Capillary 85 65 - 99 mg/dL  Glucose, capillary     Status: None   Collection Time: 07/17/15  9:11 PM  Result Value Ref Range   Glucose-Capillary 81 65 - 99 mg/dL   Comment 1 Notify RN  Glucose, capillary     Status: None   Collection Time: 07/18/15  7:18 AM  Result Value Ref Range   Glucose-Capillary 73 65 - 99 mg/dL   Comment 1 Notify RN      HEENT: Normocephalic, atraumatic Cardio: RRR and no murmur Resp: CTA B/L and unlabored GI: BS positive and LLQ colostomy, midline open abd wound with wound vac Skin:   Wound see above, no heel ulceration. VAC suctioning without leaks Neuro: Alert/Oriented  Sensation intact to light  touch Musc/Skel:  No tenderness of extremities No Edema Antigravity strength throughout, except for b/l hip flexors Gen NAD. Vital signs reviewed   Assessment/Plan: 1. Functional deficits secondary to debilitation/Crohn's disease/perforated left colon with peritonitis/exploratory laparotomy with drainage of intra-abdominal abscess partial colon resection with colostomy which require 3+ hours per day of interdisciplinary therapy in a comprehensive inpatient rehab setting. Physiatrist is providing close team supervision and 24 hour management of active medical problems listed below. Physiatrist and rehab team continue to assess barriers to discharge/monitor patient progress toward functional and medical goals.  Enc pt to go to therapy, PICC planned for today, Endoscopy in am.  Discussed concerns of further deconditioning if pt remains at bedrest  FIM: Function - Bathing Position: Other (comment) (Edge of bed for upper body and supine/HOB elevated for lower) Body parts bathed by patient: Right arm, Left arm, Chest, Abdomen, Right upper leg, Left upper leg Body parts bathed by helper: Left lower leg, Right lower leg, Front perineal area Bathing not applicable: Abdomen, Buttocks, Front perineal area (peri care done prior to OT session) Assist Level: Touching or steadying assistance(Pt > 75%)  Function- Upper Body Dressing/Undressing What is the patient wearing?: Hospital gown Bra - Perfomed by patient: Thread/unthread right bra strap, Thread/unthread left bra strap Bra - Perfomed by helper: Hook/unhook bra (pull down sports bra) Pull over shirt/dress - Perfomed by patient: Thread/unthread right sleeve, Thread/unthread left sleeve Pull over shirt/dress - Perfomed by helper: Pull shirt over trunk Assist Level: Touching or steadying assistance(Pt > 75%) Set up : To obtain clothing/put away Function - Lower Body Dressing/Undressing What is the patient wearing?: Non-skid slipper socks Position:  Bed Pants- Performed by patient: Pull pants up/down Pants- Performed by helper: Thread/unthread right pants leg, Thread/unthread left pants leg, Pull pants up/down Non-skid slipper socks- Performed by patient: Don/doff right sock, Don/doff left sock Non-skid slipper socks- Performed by helper: Don/doff right sock, Don/doff left sock Assist for lower body dressing: Touching or steadying assistance (Pt > 75%)  Function - Toileting Toileting activity did not occur: Safety/medical concerns Toileting steps completed by patient: Adjust clothing prior to toileting, Performs perineal hygiene, Adjust clothing after toileting Toileting steps completed by helper: Adjust clothing prior to toileting, Performs perineal hygiene, Adjust clothing after toileting Assist level: Touching or steadying assistance (Pt.75%)  Function - Air cabin crew transfer activity did not occur: Safety/medical concerns Toilet transfer assistive device: Mechanical lift Mechanical lift: Maximove Assist level to toilet: 2 helpers Assist level from toilet: 2 helpers  Function - Chair/bed transfer Chair/bed transfer activity did not occur: Safety/medical concerns Chair/bed transfer method: Other Chair/bed transfer assist level: 2 helpers Chair/bed transfer assistive device: Mechanical lift Mechanical lift: Maximove Chair/bed transfer details: Verbal cues for safe use of DME/AE, Manual facilitation for placement, Verbal cues for precautions/safety  Function - Locomotion: Wheelchair Will patient use wheelchair at discharge?: Yes Type: Manual Wheelchair activity did not occur: Safety/medical concerns (debility and awaiting appropriate equipment) Max wheelchair distance: 45' Assist Level: Touching or steadying  assistance (Pt > 75%) Assist Level: Moderate assistance (Pt 50 - 74%) Assist Level: Dependent (Pt equals 0%) Turns around,maneuvers to table,bed, and toilet,negotiates 3% grade,maneuvers on rugs and over  doorsills: No Function - Locomotion: Ambulation Ambulation activity did not occur: Safety/medical concerns (significant debility)  Function - Comprehension Comprehension: Auditory Comprehension assist level: Understands complex 90% of the time/cues 10% of the time  Function - Expression Expression: Verbal Expression assist level: Expresses complex ideas: With no assist  Function - Social Interaction Social Interaction assist level: Interacts appropriately with others with medication or extra time (anti-anxiety, antidepressant).  Function - Problem Solving Problem solving assist level: Solves complex problems: With extra time  Function - Memory Memory assist level: Assistive device: No helper Patient normally able to recall (first 3 days only): Current season, Location of own room, Staff names and faces, That he or she is in a hospital   Medical Problem List and Plan: 1. Functional deficits secondary to debilitation/Crohn's disease/perforated left colon with peritonitis/exploratory laparotomy with drainage of intra-abdominal abscess partial colon resection with colostomy. Restarted  Pentasa 500 mg 4 times a day on 10/15 was several days without it -nausea may be Crohn's related, better today, will monitor intake as well as colostomy output, appreciate GI assist- endoscopy in am 2.  DVT Prophylaxis/Anticoagulation: Subcutaneous heparin. Monitor platelet counts, currently no signs of bleeding 3. Pain Management: Hydrocodone as needed 4. Acute blood loss anemia. Monitor while on heparin, hgb 7.5 on 10/23.  Repeat hgb 10/24 8.0. No need for transfusion at current time 5. Neuropsych: This patient is capable of making decisions on her own behalf. 6. Skin/Wound Care: dehiscence noted at muco cutaneous jct of stoma- CCS seeing pt on M-W-F and WOC RN following, s/p placement of wound vac,  7. Fluids/Electrolytes/Nutrition: Strict I&O with follow-up chemistries  -  - Poor solid and by mouth  fluid intake. IVF over weekend repeat BMET ok 8. Acute renal insufficiency ; improving, 1.04 on 10/23 . Will continue to monitor periodically. Repeat labs ordered for Monday.   9. Acute respiratory failure/VDRF. Extubated 06/14/2015.propable obesity hypoventilation syndrome, cont PRN O2 10. SVT. Monitor heart rate. Lopressor 50 mg twice a day, pt also severely deconditioned with poor exercise tolerance, am HR 94, cont current dose 11. Chronic systolic congestive heart failure. Monitor for any signs of fluid overload. Lasix 40 mg twice a day, monitor intake and creat, no SOB, sats ok, no change in dose at present 12.  UTI ecoli- s/p treatment recheck UA looks like recurrent UTI afeb, repeat cx e. Coli.  Keflex started 10/21-10/28.  Will d/c given possibility that this may be contributing to nausea, has had 5 d treatment 13. Morbid obesity. Body mass index 67.33 14.  Mild hypokalemia - will supplement.  Monitor for worsening with recent vomiting 15.  Severe hypoalbuminemia- supplement , this is contributing to delayed wound healing , doesn't tolerate beneprotein but drinks ensure .   15.  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    LOS (Days) 22 A FACE TO FACE EVALUATION WAS PERFORMED  KIRSTEINS,ANDREW E 07/18/2015, 7:39 AM

## 2015-07-18 NOTE — Progress Notes (Signed)
Peripherally Inserted Central Catheter/Midline Placement  The IV Nurse has discussed with the patient and/or persons authorized to consent for the patient, the purpose of this procedure and the potential benefits and risks involved with this procedure.  The benefits include less needle sticks, lab draws from the catheter and patient may be discharged home with the catheter.  Risks include, but not limited to, infection, bleeding, blood clot (thrombus formation), and puncture of an artery; nerve damage and irregular heat beat.  Alternatives to this procedure were also discussed.  PICC/Midline Placement Documentation  PICC / Midline Single Lumen 07/18/15 PICC Right Cephalic 42 cm 1 cm (Active)  Indication for Insertion or Continuance of Line Limited venous access - need for IV therapy >5 days (PICC only) 07/18/2015  6:00 PM  Exposed Catheter (cm) 1 cm 07/18/2015  6:00 PM  Dressing Change Due 07/25/15 07/18/2015  6:00 PM       [name] 07/18/2015, 6:20 PM

## 2015-07-18 NOTE — Progress Notes (Signed)
Dominique [name] 9:40 AM  Subjective: Patient without any new complaints  Objective: Patient looks better with head of bed elevated not examined today currently getting a bath no new labs  Assessment: Multiple medical problems including nausea vomiting my guess is she is going to have severe esophageal reflux changes due to lying flat too often Plan: PICC line today which we discussed and endoscopy tomorrow morning with further workup and plans pending those findings  [facility] E  Pager [phone] After 5PM or if no answer call [phone]

## 2015-07-18 NOTE — Progress Notes (Signed)
Physical Therapy Weekly Progress Note  Patient Details  Name: Dominique [name] MRN: [id number] Date of Birth: June 16, 1981  Beginning of progress report period: July 11, 2015 End of progress report period: July 18, 2015  Today's Date: 07/18/2015 PT Individual Time: 1042-1205 PT Individual Time Calculation (min): 83 min   Patient continues to make slow progress and has met 0 of 5 short term goals.  Pt's ability to participate continues to be limited by medical issues, nausea and vomiting, anxiety and impaired activity tolerance/endurance.  Pt continues to require +2 for safe bed mobility, transfers with slideboard or lift equipment, dynamic sitting balance activities and standing via use of tilt table 40-70 deg for short duration.      Patient continues to demonstrate the following deficits: pain, anxiety, impaired activity tolerance/endurance, impaired strength, ROM, balance, postural control and tolerance to upright and therefore will continue to benefit from skilled PT intervention to enhance overall performance with activity tolerance, balance, postural control, ability to compensate for deficits and functional use of  right lower extremity and left lower extremity.  Patient not progressing toward long term goals.  See goal revision..  Plan of care revisions: Car goal D/C; bed mobility, sitting balance and transfer goals downgraded.  .Due to pt continuing to require increased physical assistance and use of lift equipment pt may benefit from more prolonged rehabilitation at SNF or LTACH to minimize burden of care and to reach a higher level of functional mobility independence prior to D/C home with family.  PT Short Term Goals Week 3:  PT Short Term Goal 1 (Week 3): Pt will demonstrate dynamic sitting balance with min A for slideboards and functional activities EOB and edge of mat x 10-15 min PT Short Term Goal 1 - Progress (Week 3): Not met PT Short Term Goal 2 (Week 3): Pt will perform sit <>  stand from elevated mat with appropriate lift equipment and max A and maintain standing 1-2 minutes at a time PT Short Term Goal 2 - Progress (Week 3): Not met PT Short Term Goal 3 (Week 3): Pt will perform slideboard w/c <> bed with consistent mod A PT Short Term Goal 3 - Progress (Week 3): Not met PT Short Term Goal 4 (Week 3): Pt will perform bed mobility with consistent min A and use of bed controls PT Short Term Goal 4 - Progress (Week 3): Progressing toward goal PT Short Term Goal 5 (Week 3): Pt will perform w/c mobility x 150' in controlled environment with supervision PT Short Term Goal 5 - Progress (Week 3): Progressing toward goal Week 4:  PT Short Term Goal 1 (Week 4): = LTG which have been downgraded due to slow progress  Skilled Therapeutic Interventions/Progress Updates:   Pt received in shuttle recliner; pt continues to present with nausea and vomiting; assisted with oral hygiene in chair by OT CS.  Pt agreeable to tilt table but states she "doesn't want to try anything new".  Pt transported to dayroom in shuttle chair and placed into stretcher position.  Pt performed rolling with min-mod A with second person assisting with placement of maxi slides.  Pt assisted with bridging/scooting laterally onto tilt table with +2 A.  On tilt table pt raised to 30-40 deg with focus on WB through bilat LE and modified squats on tilt table.  Pt began to c/o LE fatigue and then became very anxious about falling.  Pt requesting to return to supine; encouraged pt to continue with WB at 40 and then  30 deg and pt led through deep breathing and focused attention to minimize anxiety.  Also discussed goals of therapy and patient's long term goals.  Pt unable to overcome anxiety and lowered to supine to rest.  Pt raised back up to 30 deg to eat ice chips and then returned to supine and scooted laterally across to shuttle chair and raised back into sitting in reclined position.  Pt returned to room and left up in  shuttle chair with all items within reach and radio brought into the room for relaxation.     Therapy Documentation Precautions:  Precautions Precautions: Fall Precaution Comments: abdominal wound vac, colostomy Required Braces or Orthoses: Other Brace/Splint Other Brace/Splint: abdominal binder when OOB Restrictions Weight Bearing Restrictions: No Pain: Pain Assessment Pain Assessment: 0-10 Pain Score: 7  Pain Type: Acute pain Pain Location: Abdomen Pain Descriptors / Indicators: Aching Pain Frequency: Intermittent Pain Onset: Gradual Pain Intervention(s): Medication (See eMAR)  See Function Navigator for Current Functional Status.  Therapy/Group: Individual Therapy  Raylene Everts [facility] 07/18/2015, 3:39 PM

## 2015-07-19 ENCOUNTER — Encounter (HOSPITAL_COMMUNITY)
Admission: AD | Disposition: A | Discharge status: Other Acute Inpatient Hospital | Payer: Self-pay | Source: Intra-hospital | Attending: Physical Medicine & Rehabilitation

## 2015-07-19 ENCOUNTER — Inpatient Hospital Stay (HOSPITAL_COMMUNITY): Payer: Medicaid Other | Admitting: Anesthesiology

## 2015-07-19 ENCOUNTER — Encounter (HOSPITAL_COMMUNITY): Payer: Self-pay | Admitting: *Deleted

## 2015-07-19 ENCOUNTER — Inpatient Hospital Stay (HOSPITAL_COMMUNITY): Payer: Medicaid Other | Admitting: Occupational Therapy

## 2015-07-19 ENCOUNTER — Inpatient Hospital Stay (HOSPITAL_COMMUNITY): Payer: Medicaid Other | Admitting: Physical Therapy

## 2015-07-19 HISTORY — PX: ESOPHAGOGASTRODUODENOSCOPY: SHX5428

## 2015-07-19 LAB — GLUCOSE, CAPILLARY: GLUCOSE-CAPILLARY: 84 mg/dL (ref 65–99)

## 2015-07-19 SURGERY — EGD (ESOPHAGOGASTRODUODENOSCOPY)
Anesthesia: Monitor Anesthesia Care

## 2015-07-19 MED ORDER — ONDANSETRON HCL 4 MG/2ML IJ SOLN
INTRAMUSCULAR | Status: DC | PRN
  Administered 2015-07-19: 4 mg via INTRAVENOUS

## 2015-07-19 MED ORDER — LACTATED RINGERS IV SOLN
INTRAVENOUS | Status: DC | PRN
  Administered 2015-07-19: 08:00:00 via INTRAVENOUS

## 2015-07-19 MED ORDER — LIDOCAINE HCL (CARDIAC) 20 MG/ML IV SOLN
INTRAVENOUS | Status: DC | PRN
  Administered 2015-07-19: 60 mg via INTRAVENOUS

## 2015-07-19 MED ORDER — ESCITALOPRAM OXALATE 10 MG PO TABS: 10.0000 mg | ORAL_TABLET | Freq: Every day | ORAL | Status: DC

## 2015-07-19 MED ORDER — METHYLPHENIDATE HCL 5 MG PO TABS: 5.0000 mg | ORAL_TABLET | Freq: Two times a day (BID) | ORAL | Status: DC

## 2015-07-19 MED ORDER — PROPOFOL 10 MG/ML IV BOLUS
INTRAVENOUS | Status: DC | PRN
  Administered 2015-07-19: 30 mg via INTRAVENOUS
  Administered 2015-07-19: 20 mg via INTRAVENOUS
  Administered 2015-07-19 (×2): 30 mg via INTRAVENOUS
  Administered 2015-07-19: 10 mg via INTRAVENOUS

## 2015-07-19 NOTE — Consult Note (Addendum)
WOC follow-up: Ostomy pouching: Ostomy pouch changed today related to leakage behind barrier.Applied 2 piece pouch with barrier ring. Stoma is 100% red and moist, slightly above skin level, 1 1/2 inches, bleeding slightly, mucutaneous separation occurring to 50% of peristomal edges, from 6:00 o'clock to 12:00 o'clock with mod amt stool in the peristomal wound.3.5X3.5X4cm, red granulating tissue underneath when stool is absorbed. Mod amt amt semi-formed brown stool in pouch. Education provided: Demonstrated pouch change using 2 piece pouch and a barrier ring to attempt to maintain seal. Applied strip of Aquacel packing into separation edges of stoma. Enrolled patient in [facility] program: No Ostomy supplies and instructions at the bedside for nurse use. Pt watched procedure and asked appropriate questions. She will need total assistance with pouching upon discharge until this complex problem is resolved.  Wound type: Full thickness midline abd wound, CCS following for assessment and plan of care; Changed Vac dressing, mod amt reddish-tan drainage in the cannister, no further strong odor. Full thickness post-op wound 95% red, 5% yellow slough in patchy areas. Applied Mepitel contact layer, then one piece of white foam to lower inner wound, then one piece of black foam to 165m cont suction.18X9X7cm, with undermining to 2 cm at 5:00 o'clock and also 7:00 o'clock. Pt tolerated with mod amt discomfort. Plan to change dressing on M/W/F at 0800.  DJulien GirtMSN, RN, CWOCN, CWCN-AP, CNS

## 2015-07-19 NOTE — Progress Notes (Signed)
Subjective/Complaints: Discussed results of EGD as well as recommendation for CT with the patient. Also discussed mood and affect. Given relatively normal EGD, depression may be a significant issue which is reducing her appetite Remains nothing by mouth ROS: Denies CP, SOB, + nausea and -vomiting Objective: Vital Signs: Blood pressure 113/80, pulse 98, temperature 98.5 F (36.9 C), temperature source Oral, resp. rate 19, weight 151 kg (332 lb 14.3 oz), last menstrual period 05/25/2015, SpO2 98 %. No results found. Results for orders placed or performed during the hospital encounter of 06/26/15 (from the past 72 hour(s))  Glucose, capillary     Status: None   Collection Time: 07/16/15  4:38 PM  Result Value Ref Range   Glucose-Capillary 92 65 - 99 mg/dL   Comment 1 Notify RN   Glucose, capillary     Status: None   Collection Time: 07/16/15  8:46 PM  Result Value Ref Range   Glucose-Capillary 90 65 - 99 mg/dL  Basic metabolic panel     Status: Abnormal   Collection Time: 07/17/15  4:26 AM  Result Value Ref Range   Sodium 140 135 - 145 mmol/L   Potassium 4.4 3.5 - 5.1 mmol/L    Comment: DELTA CHECK NOTED   Chloride 99 (L) 101 - 111 mmol/L   CO2 32 22 - 32 mmol/L   Glucose, Bld 83 65 - 99 mg/dL   BUN 12 6 - 20 mg/dL   Creatinine, Ser 0.91 0.44 - 1.00 mg/dL   Calcium 7.4 (L) 8.9 - 10.3 mg/dL   GFR calc non Af Amer >60 >60 mL/min   GFR calc Af Amer >60 >60 mL/min    Comment: (NOTE) The eGFR has been calculated using the CKD EPI equation. This calculation has not been validated in all clinical situations. eGFR's persistently <60 mL/min signify possible Chronic Kidney Disease.    Anion gap 9 5 - 15  Glucose, capillary     Status: None   Collection Time: 07/17/15  6:42 AM  Result Value Ref Range   Glucose-Capillary 89 65 - 99 mg/dL  CBC with Differential/Platelet     Status: Abnormal   Collection Time: 07/17/15  6:46 AM  Result Value Ref Range   WBC 11.7 (H) 4.0 - 10.5 K/uL   RBC 3.01 (L) 3.87 - 5.11 MIL/uL   Hemoglobin 8.0 (L) 12.0 - 15.0 g/dL   HCT 27.2 (L) 36.0 - 46.0 %   MCV 90.4 78.0 - 100.0 fL   MCH 26.6 26.0 - 34.0 pg   MCHC 29.4 (L) 30.0 - 36.0 g/dL   RDW 20.0 (H) 11.5 - 15.5 %   Platelets 464 (H) 150 - 400 K/uL   Neutrophils Relative % 68 %   Lymphocytes Relative 16 %   Monocytes Relative 15 %   Eosinophils Relative 1 %   Basophils Relative 0 %   Neutro Abs 7.9 (H) 1.7 - 7.7 K/uL   Lymphs Abs 1.9 0.7 - 4.0 K/uL   Monocytes Absolute 1.8 (H) 0.1 - 1.0 K/uL   Eosinophils Absolute 0.1 0.0 - 0.7 K/uL   Basophils Absolute 0.0 0.0 - 0.1 K/uL   RBC Morphology STOMATOCYTES     Comment: POLYCHROMASIA PRESENT  Glucose, capillary     Status: None   Collection Time: 07/17/15 11:24 AM  Result Value Ref Range   Glucose-Capillary 82 65 - 99 mg/dL  Glucose, capillary     Status: None   Collection Time: 07/17/15  4:42 PM  Result Value Ref Range  Glucose-Capillary 85 65 - 99 mg/dL  Glucose, capillary     Status: None   Collection Time: 07/17/15  9:11 PM  Result Value Ref Range   Glucose-Capillary 81 65 - 99 mg/dL   Comment 1 Notify RN   Glucose, capillary     Status: None   Collection Time: 07/18/15  7:18 AM  Result Value Ref Range   Glucose-Capillary 73 65 - 99 mg/dL   Comment 1 Notify RN   Glucose, capillary     Status: None   Collection Time: 07/18/15 12:03 PM  Result Value Ref Range   Glucose-Capillary 89 65 - 99 mg/dL  Glucose, capillary     Status: None   Collection Time: 07/18/15  6:27 PM  Result Value Ref Range   Glucose-Capillary 80 65 - 99 mg/dL  Glucose, capillary     Status: None   Collection Time: 07/18/15  8:58 PM  Result Value Ref Range   Glucose-Capillary 80 65 - 99 mg/dL  Glucose, capillary     Status: None   Collection Time: 07/19/15  6:41 AM  Result Value Ref Range   Glucose-Capillary 84 65 - 99 mg/dL     HEENT: Normocephalic, atraumatic Cardio: RRR and no murmur Resp: CTA B/L and unlabored GI: BS positive and LLQ  colostomy, midline open abd wound with wound vac Skin:   Wound see above, no heel ulceration. VAC suctioning without leaks Neuro: Alert/Oriented   Musc/Skel:  No tenderness of extremities No Edema Antigravity strength throughout, except for b/l hip flexors Gen NAD. Vital signs reviewed   Assessment/Plan: 1. Functional deficits secondary to debilitation/Crohn's disease/perforated left colon with peritonitis/exploratory laparotomy with drainage of intra-abdominal abscess partial colon resection with colostomy which require 3+ hours per day of interdisciplinary therapy in a comprehensive inpatient rehab setting. Physiatrist is providing close team supervision and 24 hour management of active medical problems listed below. Physiatrist and rehab team continue to assess barriers to discharge/monitor patient progress toward functional and medical goals.  Resume therapy in the morning Not participating well hopefully treatment of depression will help. Also we will see if there is any underlying serious pathology on the CT of the abdomen may need to transfer back to acute care  FIM: Function - Bathing Position: Bed Body parts bathed by patient: Right arm, Left arm, Chest Body parts bathed by helper: Abdomen, Front perineal area, Buttocks, Right upper leg, Right lower leg, Left upper leg, Left lower leg Bathing not applicable: Abdomen, Buttocks, Front perineal area (peri care done prior to OT session) Assist Level: Touching or steadying assistance(Pt > 75%)  Function- Upper Body Dressing/Undressing What is the patient wearing?: Hospital gown Bra - Perfomed by patient: Thread/unthread right bra strap, Thread/unthread left bra strap Bra - Perfomed by helper: Hook/unhook bra (pull down sports bra) Pull over shirt/dress - Perfomed by patient: Thread/unthread right sleeve, Thread/unthread left sleeve Pull over shirt/dress - Perfomed by helper: Pull shirt over trunk Assist Level: Touching or  steadying assistance(Pt > 75%) Set up : To obtain clothing/put away Function - Lower Body Dressing/Undressing What is the patient wearing?: Non-skid slipper socks Position: Bed Pants- Performed by patient: Pull pants up/down Pants- Performed by helper: Thread/unthread right pants leg, Thread/unthread left pants leg, Pull pants up/down Non-skid slipper socks- Performed by patient: Don/doff right sock, Don/doff left sock Non-skid slipper socks- Performed by helper: Don/doff right sock, Don/doff left sock Assist for lower body dressing: Touching or steadying assistance (Pt > 75%)  Function - Toileting Toileting activity did not  occur: Safety/medical concerns Toileting steps completed by patient: Adjust clothing prior to toileting, Performs perineal hygiene, Adjust clothing after toileting Toileting steps completed by helper: Adjust clothing prior to toileting, Performs perineal hygiene, Adjust clothing after toileting Assist level: Touching or steadying assistance (Pt.75%)  Function - Toilet Transfers Toilet transfer activity did not occur: Safety/medical concerns Toilet transfer assistive device: Mechanical lift Mechanical lift: [place] Assist level to toilet: 2 helpers Assist level from toilet: 2 helpers  Function - Chair/bed transfer Chair/bed transfer activity did not occur: Safety/medical concerns Chair/bed transfer method: Other Chair/bed transfer assist level: 2 helpers Chair/bed transfer assistive device: Mechanical lift Mechanical lift: [place] Chair/bed transfer details: Verbal cues for safe use of DME/AE, Manual facilitation for placement, Verbal cues for precautions/safety  Function - Locomotion: Wheelchair Will patient use wheelchair at discharge?: Yes Type: Manual Wheelchair activity did not occur: Safety/medical concerns (debility and awaiting appropriate equipment) Max wheelchair distance: 47' Assist Level: Touching or steadying assistance (Pt > 75%) Assist Level:  Moderate assistance (Pt 50 - 74%) Assist Level: Dependent (Pt equals 0%) Turns around,maneuvers to table,bed, and toilet,negotiates 3% grade,maneuvers on rugs and over doorsills: No Function - Locomotion: Ambulation Ambulation activity did not occur: Safety/medical concerns (significant debility)  Function - Comprehension Comprehension: Auditory Comprehension assist level: Understands complex 90% of the time/cues 10% of the time  Function - Expression Expression: Verbal Expression assist level: Expresses complex 90% of the time/cues < 10% of the time  Function - Social Interaction Social Interaction assist level: Interacts appropriately with others with medication or extra time (anti-anxiety, antidepressant).  Function - Problem Solving Problem solving assist level: Solves basic 90% of the time/requires cueing < 10% of the time  Function - Memory Memory assist level: Requires cues to use assistive device Patient normally able to recall (first 3 days only): Current season, Location of own room, Staff names and faces, That he or she is in a hospital   Medical Problem List and Plan: 1. Functional deficits secondary to debilitation/Crohn's disease/perforated left colon with peritonitis/exploratory laparotomy with drainage of intra-abdominal abscess partial colon resection with colostomy. Restarted  Pentasa 500 mg 4 times a day on 10/15 was several days without it -nausea may be Crohn's related, better today, will monitor intake as well as colostomy output, appreciate GI assist- endoscopy in am 2.  DVT Prophylaxis/Anticoagulation: Subcutaneous heparin. Monitor platelet counts, currently no signs of bleeding 3. Pain Management: Hydrocodone as needed 4. Acute blood loss anemia. Monitor while on heparin, hgb 7.5 on 10/23.  Repeat hgb 10/24 8.0. No need for transfusion at current time 5. Neuropsych: This patient is capable of making decisions on her own behalf. 6. Skin/Wound Care: dehiscence  noted at muco cutaneous jct of stoma- CCS seeing pt on M-W-F and WOC RN following, s/p placement of wound vac,  7. Fluids/Electrolytes/Nutrition: Strict I&O with follow-up chemistries  -  - Poor solid and by mouth fluid intake. IVF over weekend repeat BMET ok 8. Acute renal insufficiency ; improving, 1.04 on 10/23 . Will continue to monitor periodically. Repeat labs ordered for Monday.   9. Acute respiratory failure/VDRF. Extubated 06/14/2015.propable obesity hypoventilation syndrome, cont PRN O2 10. SVT. Monitor heart rate. Lopressor 50 mg twice a day, pt also severely deconditioned with poor exercise tolerance, am HR 94, cont current dose 11. Chronic systolic congestive heart failure. Monitor for any signs of fluid overload. Lasix 40 mg twice a day, monitor intake and creat, no SOB, sats ok, no change in dose at present 12.  UTI ecoli-  s/p treatment recheck UA looks like recurrent UTI afeb, repeat cx e. Coli.  Keflex started 10/21-10/28.  Will d/c given possibility that this may be contributing to nausea, has had 5 d treatment 13. Morbid obesity. Body mass index 67.33 14.  Mild hypokalemia - will supplement.  Monitor for worsening with recent vomiting 15.  Severe hypoalbuminemia- supplement , this is contributing to delayed wound healing , doesn't tolerate beneprotein but drinks ensure .   15.  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   16. Probable depression may be contributing to poor intake, has flat affect. Neuropsych we'll continue to see the patient. Start methylphenidate 5 mg twice a day as well as Lexapro 5 mg in the morning LOS (Days) 23 A FACE TO FACE EVALUATION WAS PERFORMED  KIRSTEINS,ANDREW E 07/19/2015, 12:00 PM

## 2015-07-19 NOTE — Progress Notes (Signed)
Occupational Therapy Session Note  Patient Details  Name: Dominique [name] MRN: [id number] Date of Birth: [birth year]  Today's Date: 07/19/2015 OT Individual Time: 0105-0200 OT Individual Time Calculation (min): 55 min    Short Term Goals: Week 2:  OT Short Term Goal 1 (Week 2): Pt will complete upper body dressing sitting unsupported at EOB with Min assist. OT Short Term Goal 1 - Progress (Week 2): Met OT Short Term Goal 2 (Week 2): Pt will complete grooming sititng at EOB unsupported with supervision and setup OT Short Term Goal 2 - Progress (Week 2): Met OT Short Term Goal 3 (Week 2): Pt will complete slide board transfer, w/c <> raised mat, with +2 assist only (no Maxi-Sky sling used) OT Short Term Goal 3 - Progress (Week 2): Progressing toward goal Week 3:  OT Short Term Goal 1 (Week 3): Pt will complete slide board transfer, w/c <> raised mat, with +2 assist only (no mechanical lift assist support required) OT Short Term Goal 2 (Week 3): Pt will demo ability to direct caregiver(s) with transfer sequence using mechanical lift with min questioning cues OT Short Term Goal 3 (Week 3): Pt will tolerate supported upright sitting posture for 2 consecutive hours with total time in position for at least 4 hours daily. OT Short Term Goal 4 (Week 3): Pt will complete bed mobiity using DME/AE as needed with mod assist  Skilled Therapeutic Interventions/Progress Updates:    1:1 Pt visibly  Less anxious today and requesting to try something new today! Requested she try to stand in the parallel bars with the maxi sky harness.  Pt was able to roll in bed with min  A with mod cuing for sequence with bed flat for therapist to place pad.  Able to complete with one person today.  Transitioned into the w/c via maxi move.  Pt transitioned down to the gym and placed in front of parallel bars (with maxi sky walking sling placed around her).  Pt attempted to stand 5-6 times with focusing on bring her weight forward  onto her feet and maintaining forward weight shift.  Pt able to transition her weight forward but unable to achieve bottom clearance. Once transitioned back to room; used maxi move to transition back into Shuttle chair.  Therapy Documentation Precautions:  Precautions Precautions: Fall Precaution Comments: abdominal wound vac, colostomy Required Braces or Orthoses: Other Brace/Splint Other Brace/Splint: abdominal binder when OOB Restrictions Weight Bearing Restrictions: No Pain: Pain Assessment Pain Assessment: 0-10 Pain Score: 7  Pain Type: Acute pain Pain Location: Abdomen Pain Orientation: Mid Pain Intervention(s): Medication (See eMAR)- able to tolerate spasms today and work through them  See Function Navigator for Current Functional Status.   Therapy/Group: Individual Therapy  Willeen Cass [facility] 07/19/2015, 4:15 PM

## 2015-07-19 NOTE — Progress Notes (Signed)
Physical Therapy Session Note  Patient Details  Name: Dominique [name] MRN: [id number] Date of Birth: 06/19/1981  Today's Date: 07/19/2015 PT Individual Time: 1500-1600 PT Individual Time Calculation (min): 60 min   Short Term Goals: Week 4: STGs = LTGs due to ELOS  Skilled Therapeutic Interventions/Progress Updates:    Pt received up in [place] chair, agreeable to Wii Bowling on tilt table. Therapeutic Activity - Pt dependently rolled from room to day room and directed 75% of maxi-move transfer to tilt table. Pt req cues to ask PT to bring along foley bag and wound vac. Once on tilt table, PT elevates it to 30 degrees for 5 minutes while pt plays Wii Bowling. Vitals measured and found to be 92% SpO2 on RA, 103 bpm, and 123/54 mmHg. Pt agrees to be elevated to 40 degrees and after 5 minutes of Wii Bowling at this angle, vitals re-measured and found to be: 93% SpO2 on RA, 109 bpm, and 76/49 mmHg. Pt returned to supine on tilt table to treat orthostatic hypotension. While supine, PT asks pt if she felt any symptoms of her BP dropping and pt reports "yeah lightheadedness, but I'm like that all the time". PT educates pt that she must resume drinking Ensure and also drink more water. PT showed pt the urine in her foley bag and explained that it was too dark in appearance and educated pt that she must have protein for her abdominal wound to heal. Pt verbalized understanding. Therapeutic Exercise - PT instructs pt in supine LE ROM and strengthening exercises: heel slides (AAROM), supine hip abduction/adduction, SLR (AAROM), and bridges with PT & Tech providing foot stabilization to table and assist with translation of tibias forward over feet: x 10 reps each. Pt transferred back to [place] chair via [facility] and dependently rolled back to room with all needs in reach. Pt gave pt a chocolate ensure over ice and instructed her to sip on it - asking her to sit up for 10-30 minutes to allow ensure to settle in her  stomach prior to being transferred back to bed to lie down. Pt more motivated to participate during therapy session, today, but orthostatic hypotension continues to limit her progress. Pt agreeable to tilt table, daily. Continue per PT POC.   Therapy Documentation Precautions:  Precautions Precautions: Fall Precaution Comments: abdominal wound vac, colostomy Required Braces or Orthoses: Other Brace/Splint Other Brace/Splint: abdominal binder when OOB Restrictions Weight Bearing Restrictions: No Pain: Pt c/o one bout of abdominal cramping - PT gave her time to relax and allow spasm to pass.   See Function Navigator for Current Functional Status.   Therapy/Group: Individual Therapy and PT Tech as +2  Adan Beal M 07/19/2015, 4:18 PM

## 2015-07-19 NOTE — Op Note (Addendum)
[facility] [place], [address]   ENDOSCOPY PROCEDURE REPORT  PATIENT: Dominique [name], Dominique [name]  MR#: [id number] BIRTHDATE: Nov 08, 1980 , 34  yrs. old GENDER: female ENDOSCOPIST: Clarene Essex, MD REFERRED BY: PROCEDURE DATE:  07/21/15 PROCEDURE:  EGD, diagnostic ASA CLASS:     Class IV INDICATIONS:  vomiting and nausea. MEDICATIONS: Zofran 60m IV , Propofol 120 mg IV, and Lidocaine 60 mg IV TOPICAL ANESTHETIC: Cetacaine Spray  DESCRIPTION OF PROCEDURE: After the risks benefits and alternatives of the procedure were thoroughly explained, informed consent was obtained.  The Pentax Gastroscope [id number] was introduced through the mouth and advanced  Almost to the proximal jejunum , Without limitations.  The instrument was slowly withdrawn as the mucosa was fully examined. Estimated blood loss is zero unless otherwise noted in this procedure report.    the findings are recorded below       Retroflexed views revealed no abnormalities.     The scope was then withdrawn from the patient and the procedure completed.  COMPLICATIONS: There were no immediate complications.  ENDOSCOPIC IMPRESSION: 1. small hiatal hernia 2. mildly edematous folds throughout 3. either tortuous duodenum jejunum junction at ligament of Treitz or increased edema but unable to advance passed that turn  RECOMMENDATIONS: I would still stress antireflux measures at all times let alone while eating and drinking and will proceed with CT enterography to reevaluate small bowel  REPEAT EXAM:  as needed  eSigned:  MClarene Essex MD 1October 28, 20169:16 AM Revised: 12016/10/289:16 AM   CC:  CPT CODES: ICD CODES:  The ICD and CPT codes recommended by this software are interpretations from the data that the clinical staff has captured with the software.  The verification of the translation of this report to the ICD and CPT codes and modifiers is the sole responsibility of the  health care institution and practicing physician where this report was generated.  [employer] will not be held responsible for the validity of the ICD and CPT codes included on this report.  AMA assumes no liability for data contained or not contained herein. CPT is a rDesigner, television/film setof the [employer]  PATIENT NAME:  MShereka, LafortuneMR#: [id number]

## 2015-07-19 NOTE — Anesthesia Preprocedure Evaluation (Addendum)
Anesthesia Evaluation  Patient identified by MRN, date of birth, ID band Patient awake    Reviewed: Allergy & Precautions, NPO status , Patient's Chart, lab work & pertinent test results  Airway Mallampati: II  TM Distance: >3 FB Neck ROM: Full    Dental no notable dental hx.    Pulmonary asthma , sleep apnea , former smoker,    breath sounds clear to auscultation + decreased breath sounds      Cardiovascular +CHF  Normal cardiovascular exam Rhythm:Regular Rate:Normal  Left ventricle: The cavity size was moderately dilated. Wall thickness was increased in a pattern of mild LVH. Systolic function was severely reduced. The estimated ejection fraction was 20%. Diffuse hypokinesis. The study is not technically sufficient to allow evaluation of LV diastolic function. - Ventricular septum: Septal motion showed paradox. - Left atrium: Mildly dilated at 35 ml/m2. - Right ventricle: The cavity size was mildly dilated. The moderator band was prominent. Systolic function was normal. - Right atrium: The atrium was mildly dilated. - Tricuspid valve: There was mild regurgitation. - Pulmonary arteries: PA peak pressure: 40 mm Hg (S) + RAP. - Pericardium, extracardiac: A trivial pericardial effusion was identified.     Neuro/Psych negative neurological ROS  negative psych ROS   GI/Hepatic negative GI ROS, Neg liver ROS,   Endo/Other  negative endocrine ROS  Renal/GU negative Renal ROS  negative genitourinary   Musculoskeletal negative musculoskeletal ROS (+)   Abdominal (+) + obese,   Peds negative pediatric ROS (+)  Hematology  (+) anemia ,   Anesthesia Other Findings   Reproductive/Obstetrics negative OB ROS                           Anesthesia Physical Anesthesia Plan  ASA: IV  Anesthesia Plan: MAC   Post-op Pain Management:    Induction: Intravenous  Airway Management  Planned: Nasal Cannula  Additional Equipment:   Intra-op Plan:   Post-operative Plan:   Informed Consent: I have reviewed the patients History and Physical, chart, labs and discussed the procedure including the risks, benefits and alternatives for the proposed anesthesia with the patient or authorized representative who has indicated his/her understanding and acceptance.   Dental advisory given  Plan Discussed with: CRNA and Surgeon  Anesthesia Plan Comments:         Anesthesia Quick Evaluation

## 2015-07-19 NOTE — Anesthesia Postprocedure Evaluation (Deleted)
  Anesthesia Post-op Note  Patient: Dominique [name]  Procedure(s) Performed: Procedure(s) (LRB): ESOPHAGOGASTRODUODENOSCOPY (EGD) (N/A)  Patient Location: PACU  Anesthesia Type: MAC  Level of Consciousness: awake and alert   Airway and Oxygen Therapy: Patient Spontanous Breathing  Post-op Pain: mild  Post-op Assessment: Post-op Vital signs reviewed, Patient's Cardiovascular Status Stable, Respiratory Function Stable, Patent Airway and No signs of Nausea or vomiting  Last Vitals:  Filed Vitals:   07/19/15 0811  BP: 115/55  Pulse: 88  Temp: 36.9 C  Resp: 16    Post-op Vital Signs: stable   Complications: No apparent anesthesia complications

## 2015-07-19 NOTE — Progress Notes (Signed)
Dominique [name] 8:35 AM  Subjective: Patient without any new complaints  Objective: Vital signs stable afebrile exam please see preassessment evaluation no new labs  Assessment: Inability to eat  Plan: Okay to proceed with endoscopy with anesthesia assistance  [facility] E  Pager [phone] After 5PM or if no answer call [phone]

## 2015-07-19 NOTE — Anesthesia Postprocedure Evaluation (Signed)
  Anesthesia Post-op Note  Patient: Dominique [name]  Procedure(s) Performed: Procedure(s) (LRB): ESOPHAGOGASTRODUODENOSCOPY (EGD) (N/A)  Patient Location: PACU  Anesthesia Type: MAC  Level of Consciousness: awake and alert   Airway and Oxygen Therapy: Patient Spontanous Breathing  Post-op Pain: mild  Post-op Assessment: Post-op Vital signs reviewed, Patient's Cardiovascular Status Stable, Respiratory Function Stable, Patent Airway and No signs of Nausea or vomiting  Last Vitals:  Filed Vitals:   07/19/15 1004  BP: 113/80  Pulse:   Temp:   Resp:     Post-op Vital Signs: stable   Complications: No apparent anesthesia complications

## 2015-07-19 NOTE — Transfer of Care (Signed)
Immediate Anesthesia Transfer of Care Note  Patient: Dominique [name]  Procedure(s) Performed: Procedure(s): ESOPHAGOGASTRODUODENOSCOPY (EGD) (N/A)  Patient Location: Endoscopy Unit  Anesthesia Type:MAC  Level of Consciousness: awake, alert  and oriented  Airway & Oxygen Therapy: Patient Spontanous Breathing and Patient connected to nasal cannula oxygen  Post-op Assessment: Report given to RN and Post -op Vital signs reviewed and stable  Post vital signs: Reviewed and stable  Last Vitals:  Filed Vitals:   07/19/15 0811  BP: 115/55  Pulse: 88  Temp: 36.9 C  Resp: 16    Complications: No apparent anesthesia complications

## 2015-07-20 ENCOUNTER — Inpatient Hospital Stay (HOSPITAL_COMMUNITY): Payer: Medicaid Other

## 2015-07-20 ENCOUNTER — Encounter (HOSPITAL_COMMUNITY): Payer: Self-pay | Admitting: Gastroenterology

## 2015-07-20 ENCOUNTER — Encounter (HOSPITAL_COMMUNITY): Payer: Medicaid Other

## 2015-07-20 ENCOUNTER — Inpatient Hospital Stay (HOSPITAL_COMMUNITY): Payer: Medicaid Other | Admitting: Occupational Therapy

## 2015-07-20 ENCOUNTER — Inpatient Hospital Stay (HOSPITAL_COMMUNITY): Payer: Medicaid Other | Admitting: Physical Therapy

## 2015-07-20 DIAGNOSIS — T8149XA Infection following a procedure, other surgical site, initial encounter: Secondary | ICD-10-CM

## 2015-07-20 DIAGNOSIS — T8143XA Infection following a procedure, organ and space surgical site, initial encounter: Secondary | ICD-10-CM | POA: Insufficient documentation

## 2015-07-20 LAB — GLUCOSE, CAPILLARY
GLUCOSE-CAPILLARY: 108 mg/dL — AB (ref 65–99)
GLUCOSE-CAPILLARY: 116 mg/dL — AB (ref 65–99)
GLUCOSE-CAPILLARY: 97 mg/dL (ref 65–99)
Glucose-Capillary: 106 mg/dL — ABNORMAL HIGH (ref 65–99)
Glucose-Capillary: 94 mg/dL (ref 65–99)
Glucose-Capillary: 76 mg/dL (ref 65–99)
Glucose-Capillary: 95 mg/dL (ref 65–99)

## 2015-07-20 LAB — COMPREHENSIVE METABOLIC PANEL
ALBUMIN: 1.1 g/dL — AB (ref 3.5–5.0)
ALT: 28 U/L (ref 14–54)
ANION GAP: 7 (ref 5–15)
AST: 37 U/L (ref 15–41)
Alkaline Phosphatase: 263 U/L — ABNORMAL HIGH (ref 38–126)
BILIRUBIN TOTAL: 0.8 mg/dL (ref 0.3–1.2)
BUN: 15 mg/dL (ref 6–20)
CHLORIDE: 97 mmol/L — AB (ref 101–111)
CO2: 34 mmol/L — ABNORMAL HIGH (ref 22–32)
Calcium: 7.3 mg/dL — ABNORMAL LOW (ref 8.9–10.3)
Creatinine, Ser: 1.17 mg/dL — ABNORMAL HIGH (ref 0.44–1.00)
GFR calc Af Amer: >60 mL/min (ref >60)
GFR, EST NON AFRICAN AMERICAN: 60 mL/min — AB (ref >60)
GLUCOSE: 95 mg/dL (ref 65–99)
POTASSIUM: 3.1 mmol/L — AB (ref 3.5–5.1)
Sodium: 138 mmol/L (ref 135–145)
TOTAL PROTEIN: 5 g/dL — AB (ref 6.5–8.1)

## 2015-07-20 LAB — CBC
HEMATOCRIT: 25.2 % — AB (ref 36.0–46.0)
HEMOGLOBIN: 7.7 g/dL — AB (ref 12.0–15.0)
MCH: 27.7 pg (ref 26.0–34.0)
MCHC: 30.6 g/dL (ref 30.0–36.0)
MCV: 90.6 fL (ref 78.0–100.0)
Platelets: 413 10*3/uL — ABNORMAL HIGH (ref 150–400)
RBC: 2.78 MIL/uL — ABNORMAL LOW (ref 3.87–5.11)
RDW: 19.4 % — ABNORMAL HIGH (ref 11.5–15.5)
WBC: 10.4 10*3/uL (ref 4.0–10.5)

## 2015-07-20 LAB — PREALBUMIN: Prealbumin: 6.7 mg/dL — ABNORMAL LOW (ref 18–38)

## 2015-07-20 MED ORDER — IOHEXOL 300 MG/ML  SOLN
100.0000 mL | Freq: Once | INTRAMUSCULAR | Status: AC | PRN
  Administered 2015-07-20: 100 mL via INTRAVENOUS

## 2015-07-20 MED ORDER — BARIUM SULFATE 0.1 % PO SUSP
ORAL | Status: AC
  Filled 2015-07-20: qty 3

## 2015-07-20 NOTE — Consult Note (Signed)
  Neuropsychology Psychosocial - Confidential [place] _____________________________________________________________________________   Ms. Judyth Demarais is a 34 year old woman, who was previously seen for a Psychodiagnostic evaluation and psychosocial follow-up for management of depression and anxiety in the setting of debilitation.  She was referred for today's follow-up session owing to continued presentation of anxious and depressive symptoms.    Today, Ms. Capito described improvement in depression and anxiety, though she noted that she had an episode of significant anxiety while on the tilt table a couple of days ago.  Regarding that incident, she stated that she was afraid that her feet were becoming fatigued and that her knees might buckle.  She also mentioned that recently, her blood pressure has been dropping when on the tilt table, which prevents her from going higher.  Ms. Beckom said that she tried deep breathing when she was feeling anxious on the table, but it helped only partially.  Aside from this episode, Ms. Grams stated that her mood has generally been better and it has helped to take one day at a time.  She mentioned that she was initially discouraged about having to go to skilled nursing after this, but has been trying to adjust her thought process to remind herself that it is all part of plan and she continues to try and think positively.  Ms. Zachery mentioned that she was able to discuss with Pilar Plate and Olivia Mackie her concerns about size differences between she and staff members and that the conversation went well; she felt reassured.  Ms. Gashi is looking forward, however, to beginning a new anti-anxiety medication, as she felt as though her mental anxiety was reduced with the last one.    In addition to addressing mood concerns, time was spent discussing her lack of eating and she mentioned that she has not been eating regularly since April, 2016 due to nausea.   Time was spent processing how extended starvation of her body could impact her overall health and physical recovery.  She commented that she was open to drinking Ensure and to starting to re-train her stomach to accept food under supervision of her physician.    Ms. Weld appears to perform better and make more progress when she generates ideas on her own.  Therefore, staff members may find benefit in providing her with a couple of choices as to what to do each day or how far to take each activity and allowing her to pick what to do.  She specifically said that she likes trying new things and especially enjoyed Wii bowling when she tried it recently.  When possible, staff members may try and think of activities that she has not tried before or think of a new way to do things in order to keep her interested.    Continued follow-up with the neuropsychologist could be requested should her treatment team feel that it would be beneficial in informing care.    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   Marlane Hatcher, PsyD Clinical Neuropsychologist

## 2015-07-20 NOTE — Progress Notes (Signed)
Occupational Therapy Session Note  Patient Details  Name: Marnell Mcdaniel MRN: [id number] Date of Birth: [birth year]  Today's Date: 07/20/2015 OT Individual Time:0930-1100 50 Min  Short Term Goals: Week 3:  OT Short Term Goal 1 (Week 3): Pt will complete slide board transfer, w/c <> raised mat, with +2 assist only (no mechanical lift assist support required) OT Short Term Goal 2 (Week 3): Pt will demo ability to direct caregiver(s) with transfer sequence using mechanical lift with min questioning cues OT Short Term Goal 3 (Week 3): Pt will tolerate supported upright sitting posture for 2 consecutive hours with total time in position for at least 4 hours daily. OT Short Term Goal 4 (Week 3): Pt will complete bed mobiity using DME/AE as needed with mod assist  Skilled Therapeutic Interventions/Progress Updates: Therapeutic activity, +2 helper assisting, with focus on improved bed mobility, activity tolerance, static sitting balance, and facilitated sit<>stand using Clarise Cruz Plus.   Pt able to complete bed mobility using bed rails, mod A to manage legs (initiate flexion at hips/knees).   Pt rises to sit at edge of bed with Max A of 1 lift upper trunk from mattress.   Pt maintains sitting balance with only intermittent cues to shift weight and alternate grip from bed rail to bed frame.   After extensive pt education on Clarise Cruz Plus operation, extra time to place sling, and incremental adjustment to raise bed level, with mod assist of 1 to support right knee and lift, pt rises to standing 2 times, approx 20-30 second duration, with 1 rest break between standing session.   Pt reports exhilaration from experience, tearful and pleased with her performance but is unable to continue standing sessions d/t fatigue and requests return to supine in bed to recover.   HR assessed as pt returned to edge of bed, at 125 bpm, BP within her typical limits; pt is w/o complaint of symtpoms other than fatigue.   Pt left in bed at end of  session with all needs placed within reach.     Therapy Documentation Precautions:  Precautions Precautions: Fall Precaution Comments: abdominal wound vac, colostomy Required Braces or Orthoses: Other Brace/Splint Other Brace/Splint: abdominal binder when OOB Restrictions Weight Bearing Restrictions: No  Vital Signs: Therapy Vitals Temp: 98.1 F (36.7 C) Temp Source: Oral Pulse Rate: (!) 103 Resp: 16 BP: (!) 103/53 mmHg Patient Position (if appropriate): Lying Oxygen Therapy SpO2: 100 % O2 Device: Not Delivered O2 Flow Rate (L/min): 3 L/min   Pain: Intermittent spasms at stomach during bed mobility; resolved with rest/extra time.  ADL: ADL ADL Comments: see Functional Assessment  See Function Navigator for Current Functional Status.   Therapy/Group: Individual Therapy  Lawonda Pretlow 07/20/2015, 7:14 AM

## 2015-07-20 NOTE — Progress Notes (Signed)
Physical Therapy Session Note  Patient Details  Name: Dominique [name] MRN: [id number] Date of Birth: [birth year]  Today's Date: 07/20/2015 PT Individual Time: 1300-1400 PT Individual Time Calculation (min): 60 min   Short Term Goals: Week 4:  PT Short Term Goal 1 (Week 4): = LTG which have been downgraded due to slow progress  Skilled Therapeutic Interventions/Progress Updates:    Pt received in bed - napping - but agreeable to PT upon waking. Therapeutic Exercise - bed placed in max reverse [place] position with feet against footboard and pt completes: mini-squats, marches, heel raises: x 30 reps. With bed in flat position, pt completes: bridges (PT stabilizes pt's feet): x 30 reps. Transport interrupted PT to take pt to CT and pt missed 40 minutes PT. Continue per PT POC.   Therapy Documentation Precautions:  Precautions Precautions: Fall Precaution Comments: abdominal wound vac, colostomy Required Braces or Orthoses: Other Brace/Splint Other Brace/Splint: abdominal binder when OOB Restrictions Weight Bearing Restrictions: No Pain: Pain Assessment Pain Assessment: 0-10 Pain Score: 6  Pain Type: Acute pain Pain Location: Abdomen Pain Orientation: Mid Pain Descriptors / Indicators: Spasm Pain Onset: Sudden Pain Intervention(s): Rest Multiple Pain Sites: No   See Function Navigator for Current Functional Status.   Therapy/Group: Individual Therapy and Kelvin, PT TEch as +2  Kelsey Durflinger M 07/20/2015, 1:07 PM

## 2015-07-20 NOTE — Progress Notes (Signed)
Subjective/Complaints: Discussed results of EGD with GI.  Mild gastritis and esophagitis, CT abd ordered but not completed   Also discussed low alb and need for alternate form of nutrition, TPN vs TF depending upon CT abd result     ROS: Denies CP, SOB, + nausea and -vomiting Objective: Vital Signs: Blood pressure 103/53, pulse 103, temperature 98.1 F (36.7 C), temperature source Oral, resp. rate 16, weight 151 kg (332 lb 14.3 oz), last menstrual period 05/25/2015, SpO2 100 %. No results found. Results for orders placed or performed during the hospital encounter of 06/26/15 (from the past 72 hour(s))  Glucose, capillary     Status: None   Collection Time: 07/17/15 11:24 AM  Result Value Ref Range   Glucose-Capillary 82 65 - 99 mg/dL  Glucose, capillary     Status: None   Collection Time: 07/17/15  4:42 PM  Result Value Ref Range   Glucose-Capillary 85 65 - 99 mg/dL  Glucose, capillary     Status: None   Collection Time: 07/17/15  9:11 PM  Result Value Ref Range   Glucose-Capillary 81 65 - 99 mg/dL   Comment 1 Notify RN   Glucose, capillary     Status: None   Collection Time: 07/18/15  7:18 AM  Result Value Ref Range   Glucose-Capillary 73 65 - 99 mg/dL   Comment 1 Notify RN   Glucose, capillary     Status: None   Collection Time: 07/18/15 12:03 PM  Result Value Ref Range   Glucose-Capillary 89 65 - 99 mg/dL  Glucose, capillary     Status: None   Collection Time: 07/18/15  6:27 PM  Result Value Ref Range   Glucose-Capillary 80 65 - 99 mg/dL  Glucose, capillary     Status: None   Collection Time: 07/18/15  8:58 PM  Result Value Ref Range   Glucose-Capillary 80 65 - 99 mg/dL  Glucose, capillary     Status: None   Collection Time: 07/19/15  6:41 AM  Result Value Ref Range   Glucose-Capillary 84 65 - 99 mg/dL     HEENT: Normocephalic, atraumatic Cardio: RRR and no murmur Resp: CTA B/L and unlabored GI: BS positive and LLQ colostomy, midline open abd wound with wound  vac Skin:   Wound see above, no heel ulceration. VAC suctioning without leaks Neuro: Alert/Oriented   Musc/Skel:  No tenderness of extremities No Edema Antigravity strength throughout, except for b/l hip flexors Gen NAD. Vital signs reviewed   Assessment/Plan: 1. Functional deficits secondary to debilitation/Crohn's disease/perforated left colon with peritonitis/exploratory laparotomy with drainage of intra-abdominal abscess partial colon resection with colostomy which require 3+ hours per day of interdisciplinary therapy in a comprehensive inpatient rehab setting. Physiatrist is providing close team supervision and 24 hour management of active medical problems listed below. Physiatrist and rehab team continue to assess barriers to discharge/monitor patient progress toward functional and medical goals.  Awaiting CT abd, currently making minimal gains, poor nutritional status likely contributing, may need transfer to acute floor  FIM: Function - Bathing Position: Bed Body parts bathed by patient: Right arm, Left arm, Chest Body parts bathed by helper: Abdomen, Front perineal area, Buttocks, Right upper leg, Right lower leg, Left upper leg, Left lower leg Bathing not applicable: Abdomen, Buttocks, Front perineal area (peri care done prior to OT session) Assist Level: Touching or steadying assistance(Pt > 75%)  Function- Upper Body Dressing/Undressing What is the patient wearing?: Hospital gown Bra - Perfomed by patient: Thread/unthread right bra strap,  Thread/unthread left bra strap Bra - Perfomed by helper: Hook/unhook bra (pull down sports bra) Pull over shirt/dress - Perfomed by patient: Thread/unthread right sleeve, Thread/unthread left sleeve Pull over shirt/dress - Perfomed by helper: Pull shirt over trunk Assist Level: Touching or steadying assistance(Pt > 75%) Set up : To obtain clothing/put away Function - Lower Body Dressing/Undressing What is the patient wearing?: Non-skid  slipper socks Position: Bed Pants- Performed by patient: Pull pants up/down Pants- Performed by helper: Thread/unthread right pants leg, Thread/unthread left pants leg, Pull pants up/down Non-skid slipper socks- Performed by patient: Don/doff right sock, Don/doff left sock Non-skid slipper socks- Performed by helper: Don/doff right sock, Don/doff left sock Assist for lower body dressing: Touching or steadying assistance (Pt > 75%)  Function - Toileting Toileting activity did not occur: Safety/medical concerns Toileting steps completed by patient: Adjust clothing prior to toileting, Performs perineal hygiene, Adjust clothing after toileting Toileting steps completed by helper: Adjust clothing prior to toileting, Performs perineal hygiene, Adjust clothing after toileting Assist level: Touching or steadying assistance (Pt.75%)  Function - Air cabin crew transfer activity did not occur: Safety/medical concerns Toilet transfer assistive device: Mechanical lift Mechanical lift: Maximove Assist level to toilet: 2 helpers Assist level from toilet: 2 helpers  Function - Chair/bed transfer Chair/bed transfer activity did not occur: Safety/medical concerns Chair/bed transfer method: Other Chair/bed transfer assist level: 2 helpers Chair/bed transfer assistive device: Mechanical lift Mechanical lift: Maximove Chair/bed transfer details: Verbal cues for safe use of DME/AE, Manual facilitation for placement, Verbal cues for precautions/safety  Function - Locomotion: Wheelchair Will patient use wheelchair at discharge?: Yes Type: Manual Wheelchair activity did not occur: Safety/medical concerns (debility and awaiting appropriate equipment) Max wheelchair distance: 46' Assist Level: Touching or steadying assistance (Pt > 75%) Assist Level: Moderate assistance (Pt 50 - 74%) Assist Level: Dependent (Pt equals 0%) Turns around,maneuvers to table,bed, and toilet,negotiates 3%  grade,maneuvers on rugs and over doorsills: No Function - Locomotion: Ambulation Ambulation activity did not occur: Safety/medical concerns (significant debility)  Function - Comprehension Comprehension: Auditory Comprehension assist level: Understands complex 90% of the time/cues 10% of the time  Function - Expression Expression: Verbal Expression assist level: Expresses complex 90% of the time/cues < 10% of the time  Function - Social Interaction Social Interaction assist level: Interacts appropriately with others with medication or extra time (anti-anxiety, antidepressant).  Function - Problem Solving Problem solving assist level: Solves basic 90% of the time/requires cueing < 10% of the time  Function - Memory Memory assist level: More than reasonable amount of time Patient normally able to recall (first 3 days only): Current season, Location of own room, Staff names and faces, That he or she is in a hospital   Medical Problem List and Plan: 1. Functional deficits secondary to debilitation/Crohn's disease/perforated left colon with peritonitis/exploratory laparotomy with drainage of intra-abdominal abscess partial colon resection with colostomy. Restarted  Pentasa 500 mg 4 times a day on 10/15 was several days without it -nausea may be Crohn's related, better today, will monitor intake as well as colostomy output, appreciate GI assist- endoscopy in am 2.  DVT Prophylaxis/Anticoagulation: Subcutaneous heparin. Monitor platelet counts, currently no signs of bleeding 3. Pain Management: Hydrocodone as needed 4. Acute blood loss anemia. Monitor while on heparin, hgb 7.5 on 10/23.  Repeat hgb 10/24 8.0. No need for transfusion at current time 5. Neuropsych: This patient is capable of making decisions on her own behalf. 6. Skin/Wound Care: dehiscence noted at muco cutaneous jct of stoma-  CCS seeing pt on M-W-F and WOC RN following, s/p placement of wound vac,  7.  Fluids/Electrolytes/Nutrition: Strict I&O with follow-up chemistries  -  - repeat CMET with pre alb to follow 8. Acute renal insufficiency ; improving, 1.04 on 10/23 .    9. Acute respiratory failure/VDRF. Extubated 06/14/2015.propable obesity hypoventilation syndrome, cont PRN O2 10. SVT. Monitor heart rate. Lopressor 50 mg twice a day, pt also severely deconditioned with poor exercise tolerance, am HR 94, cont current dose 11. Chronic systolic congestive heart failure. Monitor for any signs of fluid overload. Lasix 40 mg twice a day, monitor intake and creat, no SOB, sats ok, no change in dose at present 12.  UTI ecoli- s/p treatment for 5d afebrile,has foley 13. Morbid obesity. Body mass index 67.33 14.  Mild hypokalemia - will supplement.  Monitor for worsening with recent vomiting and reduced  intake15.  Severe hypoalbuminemia- supplement , this is contributing to delayed wound healing , doesn't tolerate beneprotein but drinks ensure .   15.  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   16. Probable depression may be contributing to poor intake, has flat affect. Neuropsych to continue to see the patient. Start methylphenidate 5 mg twice a day as well as Lexapro 5 mg in the morning LOS (Days) 24 A FACE TO FACE EVALUATION WAS PERFORMED  Dominique [name] 07/20/2015, 6:54 AM

## 2015-07-20 NOTE — Progress Notes (Signed)
Dominique [name] 4:37 PM  Subjective: Patient seen and discussed with the rehabilitation team as well discussed with radiology and she has a abscess obstructing her ligament of Treitz which goes along with her endoscopic findings and this was discussed with her  Objective: Vital signs stable afebrile no acute distress although spitting up patient not examined today CT reviewed no signs of obvious Crohn's colonic wall edema probably due to low albumin  Assessment: Postop abscess obstructing small bowel  Plan: Interventional radiology to drain tomorrow if unsuccessful or not helpful may need surgical consultation and he is not able to eat soon might need TPN and probably would benefit from starting it now as well  [facility]  Pager [phone] After 5PM or if no answer call [phone]

## 2015-07-20 NOTE — Progress Notes (Signed)
Pt continues to refuse CPAP at this time.

## 2015-07-20 NOTE — Consult Note (Signed)
Chief Complaint: Patient was seen in consultation today for CT guided drainage of left abd/pelvic abscess  Referring Physician(s): Magod  History of Present Illness: Dominique [name] is a 34 y.o. female with PMH sig for OSA, morbid obesity, sickle cell trait, CHF, anxiety/depression, asthma and hepatic steatosis. The patient underwent exploratory laparotomy with drainage of intra-abdominal abscess as well as partial colon resection and colostomy formation in September 2016 secondary to perforated descending colon and findings of active Crohn's colitis. Recently the patient has been unable to tolerate her diet and has continuous nausea/ vomiting and abdominal cramps. CT scan of the abdomen and pelvis performed on 10/27 revealed several abscesses within the abdomen or pelvis. The largest is in the left hemiabdomen and has mass effect upon the small bowel loops. This collection measures approximately 12 cm in maximum dimension. Request is now been received for CT guided drainage of this left abdominal/pelvic fluid collection/abscess.  Past Medical History  Diagnosis Date  . Asthma   . Sleep apnea   . Anemia   . Morbid obesity ([place]) 03/22/2009    Qualifier: Diagnosis of  By: Ronnald Ramp CMA, [place]    . OSA (obstructive sleep apnea) 05/02/2014  . Thyromegaly 05/02/2014  . Seasonal allergies 06/13/2014  . Acute acalculous cholecystitis 11/16/2014  . Diverticulitis of colon 11/17/2014  . Hepatic steatosis 11/17/2014  . Sickle cell anemia ([place])     She states she " has the trait" (07/20/15)  . CHF (congestive heart failure) [facility])     Past Surgical History  Procedure Laterality Date  . C section 2011  2011  . Flexible sigmoidoscopy N/A 06/05/2015    Procedure: FLEXIBLE SIGMOIDOSCOPY;  Surgeon: Ronald Lobo, MD;  Location: [place];  Service: Endoscopy;  Laterality: N/A;  . Laparotomy N/A 06/09/2015    Procedure: EXPLORATORY LAPAROTOMY, DRAINAGE OF INTRAABDOMINAL ABSCESS, PARTIAL COLON RESECTION,   APPLICATION OF WOUND VAC;  Surgeon: Georganna Skeans, MD;  Location: [place];  Service: General;  Laterality: N/A;  . Laparotomy N/A 06/11/2015    Procedure: RE-EXPLORATION OF ABDOMEN;  Surgeon: Georganna Skeans, MD;  Location: [place];  Service: General;  Laterality: N/A;  . Colostomy N/A 06/11/2015    Procedure:  CREATION OF COLOSTOMY;  Surgeon: Georganna Skeans, MD;  Location: [place];  Service: General;  Laterality: N/A;  . Esophagogastroduodenoscopy N/A 07/19/2015    Procedure: ESOPHAGOGASTRODUODENOSCOPY (EGD);  Surgeon: Clarene Essex, MD;  Location: [facility] ENDOSCOPY;  Service: Endoscopy;  Laterality: N/A;    Allergies: Other and Penicillins  Medications: Prior to Admission medications   Medication Sig Start Date End Date Taking? Authorizing Provider  ferrous sulfate 325 (65 FE) MG tablet Take 2 tablets (650 mg total) by mouth daily with breakfast. 04/07/15  Yes Midge Minium, MD  furosemide (LASIX) 40 MG tablet Take 1 tablet (40 mg total) by mouth 2 (two) times daily. 06/26/15  Yes Verlee Monte, MD  mesalamine (PENTASA) 250 MG CR capsule Take 2 capsules (500 mg total) by mouth 4 (four) times daily. 06/26/15  Yes Verlee Monte, MD  metoprolol succinate (TOPROL XL) 50 MG 24 hr tablet Take 1 tablet (50 mg total) by mouth daily. Take with or immediately following a meal. 06/26/15  Yes Verlee Monte, MD  ondansetron (ZOFRAN) 4 MG tablet Take 1 tablet (4 mg total) by mouth every 6 (six) hours. 05/25/15  Yes Heather Laisure, PA-C  polyethylene glycol (MIRALAX / GLYCOLAX) packet Take 17 g by mouth daily. 06/26/15  Yes Verlee Monte, MD  PROAIR HFA 108 (  90 BASE) MCG/ACT inhaler INHALE 2 PUFFS BY MOUTH INTO THE LUNGS EVERY 6 HOURS AS NEEDED FOR WHEEZING OR SHORTNESS OF BREATH 02/17/15  Yes Midge Minium, MD  saccharomyces boulardii (FLORASTOR) 250 MG capsule Take 1 capsule (250 mg total) by mouth 2 (two) times daily. 06/26/15  Yes Verlee Monte, MD  albuterol (PROVENTIL) (2.5 MG/3ML) 0.083% nebulizer solution Take 3 mLs  (2.5 mg total) by nebulization every 6 (six) hours as needed for wheezing or shortness of breath. Patient not taking: Reported on 06/03/2015 10/27/14   Rigoberto Noel, MD     Family History  Problem Relation Age of Onset  . Hypertension Mother   . Hypertension Father   . Hypertension Sister   . Asthma Brother   . Diabetes Maternal Grandmother   . Hypertension Maternal Grandmother   . Hypertension Maternal Grandfather   . Cancer Paternal Grandmother     lung  . Emphysema Paternal Grandfather   . Allergies Mother     Social History   Social History  . Marital Status: Single    Spouse Name: N/A  . Number of Children: N/A  . Years of Education: N/A   Social History Main Topics  . Smoking status: Former Smoker -- 0.50 packs/day for 4 years    Types: Cigarettes    Start date: 07/24/2014  . Smokeless tobacco: Never Used  . Alcohol Use: No  . Drug Use: No  . Sexual Activity: Not Asked   Other Topics Concern  . None   Social History Narrative      Review of Systems  Constitutional: Negative for fever and chills.  Respiratory: Negative for cough and shortness of breath.   Cardiovascular:       Occasional lower chest/epigastric discomfort  Gastrointestinal: Positive for nausea, vomiting and abdominal pain. Negative for blood in stool.  Genitourinary: Negative for dysuria.  Musculoskeletal: Negative for back pain.  Neurological: Negative for headaches.  Psychiatric/Behavioral: The patient is nervous/anxious.     Vital Signs: BP 121/63 mmHg  Pulse 109  Temp(Src) 98.6 F (37 C) (Oral)  Resp 16  Wt 332 lb 14.3 oz (151 kg)  SpO2 92%  LMP 05/25/2015 (LMP Unknown)  Physical Exam  Constitutional: She appears well-developed and well-nourished.  Cardiovascular: Normal rate and regular rhythm.   Pulmonary/Chest: Effort normal.  Breath sounds clear to auscultation bilaterally anteriorly  Abdominal: Soft. There is tenderness.  Morbidly obese with midline wound VAC and left  lower quadrant colostomy  Musculoskeletal: Normal range of motion.    Mallampati Score:  MD Evaluation Airway: WNL Heart: WNL Abdomen: WNL Chest/ Lungs: WNL ASA  Classification: 4 Mallampati/Airway Score: Three  Imaging: Ct Abdomen Pelvis Wo Contrast  06/23/2015  CLINICAL DATA:  Wound infection. History of perforated left colon with peritonitis and peritoneal abscess. Crohn's disease . Laparotomy 06/11/2015 and 06/09/2015 EXAM: CT ABDOMEN AND PELVIS WITHOUT CONTRAST TECHNIQUE: Multidetector CT imaging of the abdomen and pelvis was performed following the standard protocol without IV contrast. COMPARISON:  CT 06/04/2015 FINDINGS: Lower chest: New mild bibasilar atelectasis without pleural effusion. Hepatobiliary: Limited evaluation of the liver due to her large patient size and lack of intravenous contrast. No liver lesion. Gallbladder bile ducts intact. Pancreas: Mild pancreatic enlargement compared to the prior study with mild edema below the uncinate process. Possible pancreatitis. Spleen: Negative Adrenals/Urinary Tract: Negative for renal obstruction or stone. Stomach/Bowel: Negative for bowel obstruction. No significant ileus. Left colostomy. Question mild edema in the right and transverse colon, not present previously. This  could be due to colitis or possibly lack of adequate distention. No intra-abdominal abscess. Vascular/Lymphatic: Negative Reproductive: Normal uterus.  No adnexal mass. Other: Small amount of free fluid in the left pelvis.  No abscess. Open wound in the midline down to the fascia. No abscess in the soft tissues. Musculoskeletal: Negative IMPRESSION: Postop left colectomy with left colostomy. Mild thickening of the right and transverse colon may reflect colitis. These areas did not show thickening on the prior study and therefore not likely to represent Crohn's colitis. Small amount of free fluid in the left pelvis.  Negative for abscess Open wound in the anterior abdominal  wall. Question mild pancreatitis with mild pancreatic edema. Limited evaluation due to lack of IV contrast large patient size. Mild atelectasis in the lung bases. Electronically Signed   By: Franchot Gallo M.D.   On: 06/23/2015 19:53   Ct Abdomen Pelvis W Contrast  07/20/2015  CLINICAL DATA:  34 year old. Vomiting. Recent colon surgery. Patient unable to tolerate p.o. contrast. EXAM: CT ABDOMEN AND PELVIS WITH CONTRAST TECHNIQUE: Multidetector CT imaging of the abdomen and pelvis was performed using the standard protocol following bolus administration of intravenous contrast. CONTRAST:  147m OMNIPAQUE IOHEXOL 300 MG/ML  SOLN COMPARISON:  06/23/2015 FINDINGS: Lower chest: No pleural effusion identified. There is atelectasis noted in the left base. Hepatobiliary: Diffuse hepatic steatosis identified. No focal liver abnormality. The gallbladder is normal. No biliary dilatation. Pancreas: Negative. Spleen: Negative. Adrenals/Urinary Tract: The adrenal glands are normal. The kidneys are unremarkable. Urinary bladder is collapsed around a Foley catheter balloon. Stomach/Bowel: Normal appearance of the stomach. The proximal small bowel loops have a normal caliber. No bowel wall thickening or dilatation. There is diffuse wall thickening involving the colon. 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 Vascular/Lymphatic: Normal appearance of the abdominal aorta. No enlarged retroperitoneal or mesenteric adenopathy. No enlarged pelvic or inguinal lymph nodes. Reproductive: The uterus and the adnexal structures are unremarkable. Other: Within the left hemi abdomen there is a well-circumscribed fluid collection measuring 4.6 x 7.7 x 12.0 cm. This appears to exert mass effect upon the adjacent left upper quadrant small bowel loops. There is surrounding fating. stranding. Several smaller fluid collections are identified within the left lower quadrant and central pelvis. The largest of  these is along the fundus of the uterus measuring 5.5 x 1.6 x 2.3 cm. Diffuse body wall edema is identified compatible with anasarca. Open ventral abdominal wall wound is identified with drain in place. Musculoskeletal: Unremarkable. IMPRESSION: 1. Examination is remarkable for several abscesses within the abdomen and pelvis. The largest is in the left hemi abdomen and has mass effect upon the small bowel loops. This has a maximum dimension of 12 cm. 2. 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 The remaining colon is diffusely edematous. 3. Hepatic steatosis. This appears progressive when compared with previous imaging. 4. No evidence to suggest small bowel obstruction. Electronically Signed   By: TKerby MoorsM.D.   On: 07/20/2015 15:55    Labs:  CBC:  Recent Labs  07/11/15 0805 07/16/15 0450 07/17/15 0646 07/20/15 0800  WBC 10.9* 11.1* 11.7* 10.4  HGB 7.9* 7.5* 8.0* 7.7*  HCT 26.6* 25.3* 27.2* 25.2*  PLT 443* 462* 464* 413*    COAGS:  Recent Labs  11/17/14 0545 06/11/15 2345  INR 1.46 1.60*    BMP:  Recent Labs  07/12/15 1600 07/16/15 0450 07/17/15 0426 07/20/15 0800  NA 139 138 140 138  K  3.9 3.6 4.4 3.1*  CL 95* 97* 99* 97*  CO2 32 33* 32 34*  GLUCOSE 97 88 83 95  BUN 19 15 12 15   CALCIUM 7.7* 7.3* 7.4* 7.3*  CREATININE 1.27* 1.04* 0.91 1.17*  GFRNONAA 54* >60 >60 60*  GFRAA >60 >60 >60 >60    LIVER FUNCTION TESTS:  Recent Labs  06/19/15 0300 06/20/15 0451 06/25/15 0427 06/26/15 0540 06/27/15 0543 07/20/15 0800  BILITOT 1.0 0.8  --   --  0.6 0.8  AST 15 17  --   --  28 37  ALT 16 16  --   --  20 28  ALKPHOS 101 81  --   --  102 263*  PROT 5.8* 5.6*  --   --  4.3* 5.0*  ALBUMIN 1.7* 1.7* 1.6* 1.5* 1.4* 1.1*    TUMOR MARKERS: No results for input(s): AFPTM, CEA, CA199, CHROMGRNA in the last 8760 hours.  Assessment and Plan: Patient with history of abdominal pain, persistent nausea,  vomiting/abdominal cramps. She is  status post exploratory laparotomy with intra-abdominal abscess drainage and partial colon resection with colostomy formation in September 2016 secondary to perforated descending colon with findings of active Crohn's colitis. Follow-up CT abdomen/ pelvis today reveals several abscesses within the abdomen and pelvis, the largest of which measures approximately 12 cm in maximum dimension and is in the left hemiabdomen with mass effect upon small bowel loops. Request now received for CT guided drainage of the left abdominal/pelvic abscess. Imaging studies have been reviewed by Dr. Vernard Gambles and abscess appears amenable to drainage.Risks and benefits discussed with the patient including bleeding, infection, damage to adjacent structures, bowel perforation/fistula connection, and sepsis.All of the patient's questions were answered, patient is agreeable to proceed.Consent signed and in chart. Procedure tentatively planned for 10/28. CCS also notified of findings. HGB 7.7, K 3.1 today - will recheck in am.     Thank you for this interesting consult.  I greatly enjoyed meeting Dominique [name] and look forward to participating in their care.  A copy of this report was sent to the requesting provider on this date.  Signed: D. Rowe Robert 07/20/2015, 4:33 PM   I spent a total of 20 minutes in face to face in clinical consultation, greater than 50% of which was counseling/coordinating care for CT-guided drainage of left abdominal/pelvic fluid collection/abscess

## 2015-07-21 ENCOUNTER — Inpatient Hospital Stay (HOSPITAL_COMMUNITY): Payer: Medicaid Other | Admitting: Physical Therapy

## 2015-07-21 ENCOUNTER — Inpatient Hospital Stay (HOSPITAL_COMMUNITY): Payer: Medicaid Other

## 2015-07-21 ENCOUNTER — Other Ambulatory Visit (HOSPITAL_COMMUNITY): Payer: Self-pay | Admitting: Interventional Radiology

## 2015-07-21 ENCOUNTER — Other Ambulatory Visit (HOSPITAL_COMMUNITY): Payer: Medicaid Other

## 2015-07-21 ENCOUNTER — Inpatient Hospital Stay (HOSPITAL_COMMUNITY): Payer: Medicaid Other | Admitting: Occupational Therapy

## 2015-07-21 ENCOUNTER — Inpatient Hospital Stay (HOSPITAL_COMMUNITY)
Admission: AD | Admit: 2015-07-21 | Discharge: 2015-07-25 | DRG: 862 | Disposition: A | Discharge status: Skilled Nursing Facility | Payer: Medicaid Other | Source: Other Acute Inpatient Hospital | Attending: Internal Medicine | Admitting: Internal Medicine

## 2015-07-21 DIAGNOSIS — T8143XA Infection following a procedure, organ and space surgical site, initial encounter: Secondary | ICD-10-CM | POA: Diagnosis present

## 2015-07-21 DIAGNOSIS — Z79899 Other long term (current) drug therapy: Secondary | ICD-10-CM | POA: Diagnosis not present

## 2015-07-21 DIAGNOSIS — D638 Anemia in other chronic diseases classified elsewhere: Secondary | ICD-10-CM | POA: Diagnosis present

## 2015-07-21 DIAGNOSIS — B9620 Unspecified Escherichia coli [E. coli] as the cause of diseases classified elsewhere: Secondary | ICD-10-CM | POA: Diagnosis present

## 2015-07-21 DIAGNOSIS — E43 Unspecified severe protein-calorie malnutrition: Secondary | ICD-10-CM | POA: Diagnosis present

## 2015-07-21 DIAGNOSIS — K5010 Crohn's disease of large intestine without complications: Secondary | ICD-10-CM | POA: Diagnosis present

## 2015-07-21 DIAGNOSIS — Z9049 Acquired absence of other specified parts of digestive tract: Secondary | ICD-10-CM

## 2015-07-21 DIAGNOSIS — Y838 Other surgical procedures as the cause of abnormal reaction of the patient, or of later complication, without mention of misadventure at the time of the procedure: Secondary | ICD-10-CM | POA: Diagnosis present

## 2015-07-21 DIAGNOSIS — F4323 Adjustment disorder with mixed anxiety and depressed mood: Secondary | ICD-10-CM | POA: Diagnosis present

## 2015-07-21 DIAGNOSIS — I272 Other secondary pulmonary hypertension: Secondary | ICD-10-CM | POA: Diagnosis present

## 2015-07-21 DIAGNOSIS — E876 Hypokalemia: Secondary | ICD-10-CM | POA: Diagnosis present

## 2015-07-21 DIAGNOSIS — D571 Sickle-cell disease without crisis: Secondary | ICD-10-CM | POA: Diagnosis present

## 2015-07-21 DIAGNOSIS — I519 Heart disease, unspecified: Secondary | ICD-10-CM | POA: Diagnosis present

## 2015-07-21 DIAGNOSIS — K760 Fatty (change of) liver, not elsewhere classified: Secondary | ICD-10-CM | POA: Diagnosis present

## 2015-07-21 DIAGNOSIS — K50019 Crohn's disease of small intestine with unspecified complications: Secondary | ICD-10-CM | POA: Diagnosis not present

## 2015-07-21 DIAGNOSIS — Z933 Colostomy status: Secondary | ICD-10-CM | POA: Diagnosis not present

## 2015-07-21 DIAGNOSIS — Z6841 Body Mass Index (BMI) 40.0 and over, adult: Secondary | ICD-10-CM | POA: Diagnosis not present

## 2015-07-21 DIAGNOSIS — R627 Adult failure to thrive: Secondary | ICD-10-CM | POA: Diagnosis present

## 2015-07-21 DIAGNOSIS — J45909 Unspecified asthma, uncomplicated: Secondary | ICD-10-CM | POA: Diagnosis present

## 2015-07-21 DIAGNOSIS — K651 Peritoneal abscess: Secondary | ICD-10-CM | POA: Diagnosis present

## 2015-07-21 DIAGNOSIS — Z1623 Resistance to quinolones and fluoroquinolones: Secondary | ICD-10-CM | POA: Diagnosis present

## 2015-07-21 DIAGNOSIS — G4733 Obstructive sleep apnea (adult) (pediatric): Secondary | ICD-10-CM | POA: Diagnosis present

## 2015-07-21 DIAGNOSIS — N390 Urinary tract infection, site not specified: Secondary | ICD-10-CM | POA: Diagnosis present

## 2015-07-21 DIAGNOSIS — Z87891 Personal history of nicotine dependence: Secondary | ICD-10-CM | POA: Diagnosis not present

## 2015-07-21 DIAGNOSIS — E871 Hypo-osmolality and hyponatremia: Secondary | ICD-10-CM | POA: Diagnosis present

## 2015-07-21 DIAGNOSIS — T814XXA Infection following a procedure, initial encounter: Principal | ICD-10-CM | POA: Diagnosis present

## 2015-07-21 DIAGNOSIS — R000 Tachycardia, unspecified: Secondary | ICD-10-CM | POA: Diagnosis present

## 2015-07-21 DIAGNOSIS — E662 Morbid (severe) obesity with alveolar hypoventilation: Secondary | ICD-10-CM | POA: Diagnosis present

## 2015-07-21 DIAGNOSIS — I429 Cardiomyopathy, unspecified: Secondary | ICD-10-CM | POA: Diagnosis not present

## 2015-07-21 DIAGNOSIS — R5381 Other malaise: Secondary | ICD-10-CM | POA: Diagnosis not present

## 2015-07-21 DIAGNOSIS — D509 Iron deficiency anemia, unspecified: Secondary | ICD-10-CM | POA: Diagnosis present

## 2015-07-21 DIAGNOSIS — Z9989 Dependence on other enabling machines and devices: Secondary | ICD-10-CM

## 2015-07-21 DIAGNOSIS — T8149XA Infection following a procedure, other surgical site, initial encounter: Secondary | ICD-10-CM

## 2015-07-21 DIAGNOSIS — I509 Heart failure, unspecified: Secondary | ICD-10-CM | POA: Diagnosis present

## 2015-07-21 DIAGNOSIS — K50118 Crohn's disease of large intestine with other complication: Secondary | ICD-10-CM | POA: Diagnosis present

## 2015-07-21 DIAGNOSIS — R1110 Vomiting, unspecified: Secondary | ICD-10-CM | POA: Diagnosis present

## 2015-07-21 DIAGNOSIS — K631 Perforation of intestine (nontraumatic): Secondary | ICD-10-CM | POA: Diagnosis present

## 2015-07-21 DIAGNOSIS — K5000 Crohn's disease of small intestine without complications: Secondary | ICD-10-CM | POA: Diagnosis not present

## 2015-07-21 DIAGNOSIS — I5022 Chronic systolic (congestive) heart failure: Secondary | ICD-10-CM | POA: Diagnosis not present

## 2015-07-21 HISTORY — DX: Atherosclerotic heart disease of native coronary artery without angina pectoris: I25.10

## 2015-07-21 LAB — GLUCOSE, CAPILLARY
GLUCOSE-CAPILLARY: 85 mg/dL (ref 65–99)
GLUCOSE-CAPILLARY: 81 mg/dL (ref 65–99)
Glucose-Capillary: 89 mg/dL (ref 65–99)
Glucose-Capillary: 85 mg/dL (ref 65–99)

## 2015-07-21 LAB — CBC
HEMATOCRIT: 25 % — AB (ref 36.0–46.0)
HEMOGLOBIN: 7.6 g/dL — AB (ref 12.0–15.0)
MCH: 27.5 pg (ref 26.0–34.0)
MCHC: 30.4 g/dL (ref 30.0–36.0)
MCV: 90.6 fL (ref 78.0–100.0)
Platelets: 450 10*3/uL — ABNORMAL HIGH (ref 150–400)
RBC: 2.76 MIL/uL — ABNORMAL LOW (ref 3.87–5.11)
RDW: 19.4 % — AB (ref 11.5–15.5)
WBC: 10.5 10*3/uL (ref 4.0–10.5)

## 2015-07-21 LAB — PROTIME-INR
INR: 1.57 — AB (ref 0.00–1.49)
Prothrombin Time: 18.9 seconds — ABNORMAL HIGH (ref 11.6–15.2)

## 2015-07-21 LAB — RETICULOCYTES
RBC.: 2.84 MIL/uL — AB (ref 3.87–5.11)
RETIC COUNT ABSOLUTE: 178.9 10*3/uL (ref 19.0–186.0)
Retic Ct Pct: 6.3 % — ABNORMAL HIGH (ref 0.4–3.1)

## 2015-07-21 LAB — VITAMIN B12: VITAMIN B 12: 1880 pg/mL — AB (ref 180–914)

## 2015-07-21 LAB — BASIC METABOLIC PANEL
ANION GAP: 8 (ref 5–15)
ANION GAP: 8 (ref 5–15)
BUN: 15 mg/dL (ref 6–20)
BUN: 12 mg/dL (ref 6–20)
CALCIUM: 7 mg/dL — AB (ref 8.9–10.3)
CALCIUM: 7.2 mg/dL — AB (ref 8.9–10.3)
CHLORIDE: 95 mmol/L — AB (ref 101–111)
CO2: 32 mmol/L (ref 22–32)
CO2: 32 mmol/L (ref 22–32)
CREATININE: 1.07 mg/dL — AB (ref 0.44–1.00)
Chloride: 98 mmol/L — ABNORMAL LOW (ref 101–111)
Creatinine, Ser: 1.17 mg/dL — ABNORMAL HIGH (ref 0.44–1.00)
GFR calc Af Amer: >60 mL/min (ref >60)
GFR calc non Af Amer: 60 mL/min — ABNORMAL LOW (ref >60)
GLUCOSE: 82 mg/dL (ref 65–99)
GLUCOSE: 86 mg/dL (ref 65–99)
POTASSIUM: 2.8 mmol/L — AB (ref 3.5–5.1)
Potassium: 3.4 mmol/L — ABNORMAL LOW (ref 3.5–5.1)
Sodium: 135 mmol/L (ref 135–145)
Sodium: 138 mmol/L (ref 135–145)

## 2015-07-21 LAB — IRON AND TIBC
Iron: 31 ug/dL (ref 28–170)
SATURATION RATIOS: 24 % (ref 10.4–31.8)
TIBC: 127 ug/dL — ABNORMAL LOW (ref 250–450)
UIBC: 96 ug/dL

## 2015-07-21 LAB — PHOSPHORUS: PHOSPHORUS: 4 mg/dL (ref 2.5–4.6)

## 2015-07-21 LAB — APTT: aPTT: 40 seconds — ABNORMAL HIGH (ref 24–37)

## 2015-07-21 LAB — MRSA PCR SCREENING: MRSA BY PCR: NEGATIVE

## 2015-07-21 LAB — MAGNESIUM: MAGNESIUM: 1.3 mg/dL — AB (ref 1.7–2.4)

## 2015-07-21 LAB — FOLATE: FOLATE: 5.6 ng/mL — AB (ref >5.9)

## 2015-07-21 LAB — FERRITIN: Ferritin: 62 ng/mL (ref 11–307)

## 2015-07-21 MED ORDER — HEPARIN SODIUM (PORCINE) 5000 UNIT/ML IJ SOLN
5000.0000 [IU] | Freq: Three times a day (TID) | INTRAMUSCULAR | Status: DC
  Administered 2015-07-21 – 2015-07-25 (×12): 5000 [IU] via SUBCUTANEOUS
  Filled 2015-07-21 (×10): qty 1

## 2015-07-21 MED ORDER — ESCITALOPRAM OXALATE 10 MG PO TABS: 10.0000 mg | ORAL_TABLET | Freq: Every day | ORAL | Status: DC

## 2015-07-21 MED ORDER — ALUM & MAG HYDROXIDE-SIMETH 200-200-20 MG/5ML PO SUSP: 30.0000 mL | Freq: Four times a day (QID) | ORAL | Status: DC | PRN

## 2015-07-21 MED ORDER — ACETAMINOPHEN 650 MG RE SUPP: 650.0000 mg | Freq: Four times a day (QID) | RECTAL | Status: DC | PRN

## 2015-07-21 MED ORDER — FENTANYL CITRATE (PF) 100 MCG/2ML IJ SOLN
INTRAMUSCULAR | Status: AC
  Filled 2015-07-21: qty 2

## 2015-07-21 MED ORDER — BOOST / RESOURCE BREEZE PO LIQD: 1.0000 | Freq: Three times a day (TID) | ORAL | Status: DC

## 2015-07-21 MED ORDER — ACETAMINOPHEN 650 MG RE SUPP
650.0000 mg | Freq: Four times a day (QID) | RECTAL | Status: DC | PRN
Start: 2015-07-21 — End: 2015-07-21

## 2015-07-21 MED ORDER — DOCUSATE SODIUM 100 MG PO CAPS
100.0000 mg | ORAL_CAPSULE | Freq: Two times a day (BID) | ORAL | Status: DC
  Administered 2015-07-21: 100 mg via ORAL
  Filled 2015-07-21 (×2): qty 1

## 2015-07-21 MED ORDER — METHOCARBAMOL 500 MG PO TABS: 500.0000 mg | ORAL_TABLET | Freq: Four times a day (QID) | ORAL | Status: DC | PRN

## 2015-07-21 MED ORDER — PANTOPRAZOLE SODIUM 40 MG PO TBEC: 40.0000 mg | DELAYED_RELEASE_TABLET | Freq: Every day | ORAL | Status: DC

## 2015-07-21 MED ORDER — SORBITOL 70 % SOLN
30.0000 mL | Freq: Every day | Status: DC | PRN
  Filled 2015-07-21: qty 30

## 2015-07-21 MED ORDER — SIMETHICONE 80 MG PO CHEW: 80.0000 mg | CHEWABLE_TABLET | Freq: Four times a day (QID) | ORAL | Status: DC | PRN

## 2015-07-21 MED ORDER — SODIUM CHLORIDE 0.45 % IV SOLN
INTRAVENOUS | Status: DC
  Administered 2015-07-21: 16:00:00 via INTRAVENOUS

## 2015-07-21 MED ORDER — OXYCODONE HCL 5 MG PO TABS
5.0000 mg | ORAL_TABLET | ORAL | Status: DC | PRN
  Administered 2015-07-21: 5 mg via ORAL
  Filled 2015-07-21: qty 1

## 2015-07-21 MED ORDER — POTASSIUM CHLORIDE 10 MEQ/100ML IV SOLN
10.0000 meq | INTRAVENOUS | Status: AC
Start: 2015-07-21 — End: 2015-07-21
  Administered 2015-07-21 (×4): 10 meq via INTRAVENOUS
  Filled 2015-07-21: qty 100

## 2015-07-21 MED ORDER — ACETAMINOPHEN 325 MG PO TABS: 650.0000 mg | ORAL_TABLET | Freq: Four times a day (QID) | ORAL | Status: DC | PRN

## 2015-07-21 MED ORDER — SACCHAROMYCES BOULARDII 250 MG PO CAPS
250.0000 mg | ORAL_CAPSULE | Freq: Two times a day (BID) | ORAL | Status: DC
  Administered 2015-07-21 – 2015-07-25 (×3): 250 mg via ORAL
  Filled 2015-07-21 (×4): qty 1

## 2015-07-21 MED ORDER — ADULT MULTIVITAMIN W/MINERALS CH
1.0000 | ORAL_TABLET | Freq: Every day | ORAL | Status: DC
  Administered 2015-07-21 – 2015-07-25 (×5): 1 via ORAL
  Filled 2015-07-21 (×6): qty 1

## 2015-07-21 MED ORDER — METOPROLOL SUCCINATE ER 50 MG PO TB24
50.0000 mg | ORAL_TABLET | Freq: Every day | ORAL | Status: DC
  Administered 2015-07-21 – 2015-07-25 (×5): 50 mg via ORAL
  Filled 2015-07-21 (×5): qty 1

## 2015-07-21 MED ORDER — METHYLPHENIDATE HCL 5 MG PO TABS
5.0000 mg | ORAL_TABLET | Freq: Two times a day (BID) | ORAL | Status: DC
  Administered 2015-07-21 – 2015-07-23 (×4): 5 mg via ORAL
  Filled 2015-07-21 (×4): qty 1

## 2015-07-21 MED ORDER — HEPARIN SODIUM (PORCINE) 5000 UNIT/ML IJ SOLN
5000.0000 [IU] | Freq: Three times a day (TID) | INTRAMUSCULAR | Status: DC
  Filled 2015-07-21: qty 1

## 2015-07-21 MED ORDER — ENSURE ENLIVE PO LIQD
237.0000 mL | Freq: Three times a day (TID) | ORAL | Status: DC
  Administered 2015-07-22 – 2015-07-25 (×5): 237 mL via ORAL

## 2015-07-21 MED ORDER — MAGNESIUM CITRATE PO SOLN: 1.0000 | Freq: Once | ORAL | Status: DC | PRN

## 2015-07-21 MED ORDER — ALBUTEROL SULFATE (2.5 MG/3ML) 0.083% IN NEBU: 2.5000 mg | INHALATION_SOLUTION | RESPIRATORY_TRACT | Status: DC | PRN

## 2015-07-21 MED ORDER — METHYLPHENIDATE HCL 5 MG PO TABS: 5.0000 mg | ORAL_TABLET | Freq: Two times a day (BID) | ORAL | Status: DC

## 2015-07-21 MED ORDER — ONDANSETRON HCL 4 MG/2ML IJ SOLN: 4.0000 mg | Freq: Four times a day (QID) | INTRAMUSCULAR | Status: DC | PRN

## 2015-07-21 MED ORDER — PROMETHAZINE HCL 25 MG/ML IJ SOLN: 6.2500 mg | INTRAMUSCULAR | Status: DC | PRN

## 2015-07-21 MED ORDER — POLYETHYLENE GLYCOL 3350 17 G PO PACK
17.0000 g | PACK | Freq: Every day | ORAL | Status: DC
  Administered 2015-07-22 – 2015-07-23 (×2): 17 g via ORAL
  Filled 2015-07-21 (×4): qty 1

## 2015-07-21 MED ORDER — FENTANYL CITRATE (PF) 100 MCG/2ML IJ SOLN
INTRAMUSCULAR | Status: AC | PRN
  Administered 2015-07-21 (×3): 50 ug via INTRAVENOUS

## 2015-07-21 MED ORDER — POLYETHYLENE GLYCOL 3350 17 G PO PACK: 17.0000 g | PACK | Freq: Every day | ORAL | Status: DC | PRN

## 2015-07-21 MED ORDER — MORPHINE SULFATE (PF) 2 MG/ML IV SOLN: 1.0000 mg | INTRAVENOUS | Status: DC | PRN

## 2015-07-21 MED ORDER — METRONIDAZOLE IN NACL 5-0.79 MG/ML-% IV SOLN
500.0000 mg | Freq: Three times a day (TID) | INTRAVENOUS | Status: DC
  Administered 2015-07-21 – 2015-07-23 (×6): 500 mg via INTRAVENOUS
  Filled 2015-07-21 (×7): qty 100

## 2015-07-21 MED ORDER — KCL IN DEXTROSE-NACL 40-5-0.9 MEQ/L-%-% IV SOLN
INTRAVENOUS | Status: DC
  Filled 2015-07-21 (×2): qty 1000

## 2015-07-21 MED ORDER — MORPHINE SULFATE (PF) 2 MG/ML IV SOLN
1.0000 mg | INTRAVENOUS | Status: DC | PRN
  Administered 2015-07-21 – 2015-07-25 (×15): 2 mg via INTRAVENOUS
  Filled 2015-07-21 (×16): qty 1

## 2015-07-21 MED ORDER — ESCITALOPRAM OXALATE 10 MG PO TABS
10.0000 mg | ORAL_TABLET | Freq: Every day | ORAL | Status: DC
  Administered 2015-07-21 – 2015-07-25 (×5): 10 mg via ORAL
  Filled 2015-07-21 (×5): qty 1

## 2015-07-21 MED ORDER — OXYCODONE HCL 5 MG PO TABS
5.0000 mg | ORAL_TABLET | ORAL | Status: DC | PRN
  Administered 2015-07-24 – 2015-07-25 (×2): 5 mg via ORAL
  Filled 2015-07-21 (×2): qty 1

## 2015-07-21 MED ORDER — POTASSIUM CHLORIDE 10 MEQ/100ML IV SOLN
10.0000 meq | INTRAVENOUS | Status: DC
  Filled 2015-07-21 (×2): qty 100

## 2015-07-21 MED ORDER — CIPROFLOXACIN IN D5W 400 MG/200ML IV SOLN
400.0000 mg | Freq: Two times a day (BID) | INTRAVENOUS | Status: DC
  Administered 2015-07-21 – 2015-07-23 (×4): 400 mg via INTRAVENOUS
  Filled 2015-07-21 (×6): qty 200

## 2015-07-21 MED ORDER — FENTANYL CITRATE (PF) 100 MCG/2ML IJ SOLN
INTRAMUSCULAR | Status: DC
Start: 2015-07-21 — End: 2015-07-21
  Filled 2015-07-21: qty 2

## 2015-07-21 MED ORDER — POTASSIUM CHLORIDE 10 MEQ/100ML IV SOLN
10.0000 meq | INTRAVENOUS | Status: DC
  Filled 2015-07-21: qty 100

## 2015-07-21 MED ORDER — POLYETHYLENE GLYCOL 3350 17 G PO PACK: 17.0000 g | PACK | Freq: Every day | ORAL | Status: DC

## 2015-07-21 MED ORDER — ONDANSETRON HCL 4 MG PO TABS: 4.0000 mg | ORAL_TABLET | Freq: Four times a day (QID) | ORAL | Status: DC | PRN

## 2015-07-21 MED ORDER — ONDANSETRON HCL 4 MG/2ML IJ SOLN
4.0000 mg | Freq: Four times a day (QID) | INTRAMUSCULAR | Status: DC | PRN
  Administered 2015-07-21 – 2015-07-22 (×2): 4 mg via INTRAVENOUS
  Filled 2015-07-21 (×3): qty 2

## 2015-07-21 MED ORDER — MESALAMINE ER 250 MG PO CPCR
500.0000 mg | ORAL_CAPSULE | Freq: Four times a day (QID) | ORAL | Status: DC
  Administered 2015-07-23 – 2015-07-24 (×2): 500 mg via ORAL
  Filled 2015-07-21 (×20): qty 2

## 2015-07-21 MED ORDER — KCL IN DEXTROSE-NACL 40-5-0.9 MEQ/L-%-% IV SOLN
INTRAVENOUS | Status: DC
  Filled 2015-07-21: qty 1000

## 2015-07-21 MED ORDER — METOPROLOL SUCCINATE ER 50 MG PO TB24: 50.0000 mg | ORAL_TABLET | Freq: Every day | ORAL | Status: DC

## 2015-07-21 MED ORDER — LIDOCAINE HCL 1 % IJ SOLN
INTRAMUSCULAR | Status: AC
  Filled 2015-07-21: qty 20

## 2015-07-21 NOTE — Progress Notes (Signed)
Patient refused to wear CPAP at this time. She said she only wears oxygen at night. RT advised her to call if she changes her mind.

## 2015-07-21 NOTE — Progress Notes (Signed)
NURSING PROGRESS NOTE  Dominique [name] [id number] Admission Data: 07/21/2015 7:58 PM Attending Ireta Pullman: Norval Morton, MD GPQ:[id number],ENMMHW, MD Code Status: Full   Dominique [name] is a 34 y.o. female patient admitted from ED:  -No acute distress noted.  -No complaints of shortness of breath.  -No complaints of chest pain.   Cardiac Monitoring: Box # 13 in place. Cardiac monitor yields:normal sinus rhythm.  Height 5' 2"  (1.575 m), weight 150.594 kg (332 lb), last menstrual period 05/25/2015.   IV Fluids:  IV in place, occlusive dsg intact without redness, PICC single lumen cath upper arm right, condition patent and no redness normal saline.   Allergies:  Other and Penicillins  Past Medical History:   has a past medical history of Asthma; Sleep apnea; Anemia; Morbid obesity ([place]) (03/22/2009); OSA (obstructive sleep apnea) (05/02/2014); Thyromegaly (05/02/2014); Seasonal allergies (06/13/2014); Acute acalculous cholecystitis (11/16/2014); Diverticulitis of colon (11/17/2014); Hepatic steatosis (11/17/2014); Sickle cell anemia (H[facility]); and CHF (congestive heart failure) ([place]).  Past Surgical History:   has past surgical history that includes c section 2011 (2011); Flexible sigmoidoscopy (N/A, 06/05/2015); laparotomy (N/A, 06/09/2015); laparotomy (N/A, 06/11/2015); Colostomy (N/A, 06/11/2015); and Esophagogastroduodenoscopy (N/A, 07/19/2015).  Social History:   reports that she has quit smoking. Her smoking use included Cigarettes. She started smoking about a year ago. She has a 2 pack-year smoking history. She has never used smokeless tobacco. She reports that she does not drink alcohol or use illicit drugs.  Skin: JP drain, wound vac and colostomy to abdomen.  Patient/Family orientated to room. Information packet given to patient/family. Admission inpatient armband information verified with patient/family to include name and date of birth and placed on patient arm. Side rails up x 2, fall assessment  and education completed with patient/family. Patient/family able to verbalize understanding of risk associated with falls and verbalized understanding to call for assistance before getting out of bed. Call light within reach. Patient/family able to voice and demonstrate understanding of unit orientation instructions.    Will continue to evaluate and treat per MD orders.

## 2015-07-21 NOTE — Patient Care Conference (Signed)
Inpatient RehabilitationTeam Conference and Plan of Care Update Date: 07/19/2015   Time: 2:10 PM    Patient Name: Dominique [name]      Medical Record Number: [id number]  Date of Birth: [birth year] Sex: Female         Room/Bed: 4M10C/4M10C-01 Payor Info: Payor: MEDICAID [place] / Plan: MEDICAID [place] ACCESS / Product Type: *No Product type* /    Admitting Diagnosis: DEBILITY anemia nausea/vomiting  Admit Date/Time:  06/26/2015  5:49 PM Admission Comments: No comment available   Primary Diagnosis:  Debilitated Principal Problem: Debilitated  Patient Active Problem List   Diagnosis Date Noted  . Postoperative intra-abdominal abscess ([place])   . Adjustment disorder with mixed anxiety and depressed mood   . Debilitated 06/26/2015  . Acute renal failure syndrome ([place])   . Cardiomyopathy- etiology not yet determined 06/20/2015  . Chronic systolic heart failure ([place])   . Pulmonary hypertension ([place])   . Chronic systolic CHF (congestive heart failure) ([place])   . Acute respiratory failure with hypoxia ([place])   . Crohn's disease ([place])   . Blood poisoning ([place])   . Perforated sigmoid colon ([place])   . Peritonitis ([place])   . Peritoneal abscess ([place])   . OSA on CPAP   . Obesity hypoventilation syndrome ([place])   . Acute renal insufficiency   . Acute encephalopathy   . Pressure ulcer 06/16/2015  . Protein-calorie malnutrition, severe ([place]) 06/10/2015  . Colitis 06/04/2015  . Generalized abdominal pain   . Gastritis 04/07/2015  . Iron deficiency anemia 04/07/2015  . Sinus tachycardia ([place]) 12/29/2014  . Lumbar back pain 12/06/2014  . Fluid retention 12/01/2014  . Abdominal pain   . Fall 11/20/2014  . Rectal bleeding 11/19/2014  . Diverticulitis of colon 11/17/2014  . Hepatic steatosis 11/17/2014  . Elevated LFTs 11/17/2014  . Seasonal allergies 06/13/2014  . OSA (obstructive sleep apnea) 05/02/2014  . Thyromegaly 05/02/2014  . AMENORRHEA 02/07/2010  . Morbid obesity- BMI 67.5 03/22/2009     Expected Discharge Date: Expected Discharge Date: 07/20/15  Team Members Present: Physician leading conference: Dr. Delice [name] Social Worker Present: Dominique Alpers, LCSW Nurse Present: Dominique Roberts, RN PT Present: Dominique [name], PT OT Present: Dominique [name], OT PPS Coordinator present : Dominique Nakayama, RN, CRRN     Current Status/Progress Goal Weekly Team Focus  Medical   Wound care, colostomy, depression, poor nutrition, IVF, nausea  Improve appetite, Hb, nausea, mood  See above, follow GI, follow abd CT   Bowel/Bladder   Foley catheter for urinary retention. Ostomy is place.   Remove foley and be able to void without catherization  Assess for patient ability to provide care to ostomy   Swallow/Nutrition/ Hydration     na        ADL's   max A for bed moblity, slide board transfer max A +3, max A for bathing  Goals downgraded to overall mod assist for BADL, w/c homemaking, mod I for dynamic sitting balance  mechanical lift for sit to stands, activity tolerance, sitting tolerance, transfers   Mobility   Min-mod A bed mobility, +2 for transfers with lift or slideboard, supervision w/c mobility, tilt table 40-70 deg; still poor activity tolerance  Goals downgraded further to mod-max A overall w/c level; D/C car transfer goal-unsafe  Activity tolerance and tolerance to upright; LE strengthening, transfers   Communication     na        Safety/Cognition/ Behavioral Observations    No unsafe behaviors  Pain   Requests pain medication for abdominal pain. Pt receiving prn Norco q4 hours.   <5  Assess for pain Q 4 hours and prn. Treat accordingly.    Skin   Abdominal wound requiring Wound Vac. Dressing changes by [facility] nurse q MWF. Ostomy wound packed with Aquacel.   Provide wound care and prevent further skin breakdown.   Assess skin Q shift and prn. Monitor wound vac drainage and for signs of infection.     Rehab Goals Patient on target to meet rehab goals: No Rehab Goals  Revised: Pt continues to make slow progress due to abdominal pain.  Goals are being downgraded. *See Care Plan and progress notes for long and short-term goals.  Barriers to Discharge: wounds, colostomy, poor PO intake, CT abd, nausea, mood    Possible Resolutions to Barriers:  Follow results of CT abd, transition to oral intake from IVF, recs by GI, started antidepressant    Discharge Planning/Teaching Needs:  Pt will need to go to another rehab facility (SNF) prior to going home.  She has 8-9 stairs to get to her apartment and she will not be able to do that after CIR stay.    TBD at the next venue   Team Discussion:  Pt is in better spirits per OT and initiated a task today and is trying to sit up more.  She continues to be anxious at times and is not eating, even no supplements, due to abdominal cramping.  Pt's PT goals were downgraded to mod to max and car transfer goal d/c'd.  MD started IV fluids at night and pt continues to have hypotensive events.  Since pt is not eating, she is having liquid stools and TPN or TF may need to be considered.  MD is pursuing further testing to r/o other medical complications.  Neuropsychologist continues to f/u with pt weekly and MD is starting pt on an antidepressant.  Revisions to Treatment Plan:  Pt's d/c plan is for SNF transfer.  Therapists are planning accordingly.   Continued Need for Acute Rehabilitation Level of Care: The patient requires daily medical management by a physician with specialized training in physical medicine and rehabilitation for the following conditions: Daily direction of a multidisciplinary physical rehabilitation program to ensure safe treatment while eliciting the highest outcome that is of practical value to the patient.: Yes Daily medical management of patient stability for increased activity during participation in an intensive rehabilitation regime.: Yes Daily analysis of laboratory values and/or radiology reports with any  subsequent need for medication adjustment of medical intervention for : Post surgical problems;Other  Dominique [name], Gardiner Rhyme 07/21/2015, 8:50 AM

## 2015-07-21 NOTE — Progress Notes (Signed)
Patient ID: Dominique [name], female   DOB: [birth year], 34 y.o.   MRN: [id number] 2 Days Post-Op  Subjective: Pt with no new complaints.   Objective: Vital signs in last 24 hours: Temp:  [98.6 F (37 C)-99 F (37.2 C)] 98.6 F (37 C) (10/28 0525) Pulse Rate:  [105-115] 105 (10/28 0525) Resp:  [17] 17 (10/28 0525) BP: (99-121)/(55-63) 100/57 mmHg (10/28 0525) SpO2:  [92 %-100 %] 100 % (10/28 0525) Last BM Date:  (colostomy (L) UQ)  Intake/Output from previous day: 10/27 0701 - 10/28 0700 In: 30 [I.V.:30] Out: 1650 [Urine:1500; Stool:150] Intake/Output this shift:    PE: Abd: soft, minimally tender, wound is cleaning up nicely and granulating in well.  Site around stoma is granulating in as well.  Ostomy working well.    Lab Results:   Recent Labs  07/20/15 0800 07/21/15 0440  WBC 10.4 10.5  HGB 7.7* 7.6*  HCT 25.2* 25.0*  PLT 413* 450*   BMET  Recent Labs  07/20/15 0800 07/21/15 0440  NA 138 135  K 3.1* 2.8*  CL 97* 95*  CO2 34* 32  GLUCOSE 95 82  BUN 15 15  CREATININE 1.17* 1.17*  CALCIUM 7.3* 7.0*   PT/INR  Recent Labs  07/21/15 0440  LABPROT 18.9*  INR 1.57*   CMP     Component Value Date/Time   NA 135 07/21/2015 0440   K 2.8* 07/21/2015 0440   CL 95* 07/21/2015 0440   CO2 32 07/21/2015 0440   GLUCOSE 82 07/21/2015 0440   BUN 15 07/21/2015 0440   CREATININE 1.17* 07/21/2015 0440   CREATININE 0.52 12/29/2014 1557   CALCIUM 7.0* 07/21/2015 0440   PROT 5.0* 07/20/2015 0800   ALBUMIN 1.1* 07/20/2015 0800   AST 37 07/20/2015 0800   ALT 28 07/20/2015 0800   ALKPHOS 263* 07/20/2015 0800   BILITOT 0.8 07/20/2015 0800   GFRNONAA 60* 07/21/2015 0440   GFRAA >60 07/21/2015 0440   Lipase     Component Value Date/Time   LIPASE 10* 06/09/2015 1325       Studies/Results: Ct Abdomen Pelvis W Contrast  07/20/2015  CLINICAL DATA:  34 year old. Vomiting. Recent colon surgery. Patient unable to tolerate p.o. contrast. EXAM: CT ABDOMEN AND PELVIS  WITH CONTRAST TECHNIQUE: Multidetector CT imaging of the abdomen and pelvis was performed using the standard protocol following bolus administration of intravenous contrast. CONTRAST:  113m OMNIPAQUE IOHEXOL 300 MG/ML  SOLN COMPARISON:  06/23/2015 FINDINGS: Lower chest: No pleural effusion identified. There is atelectasis noted in the left base. Hepatobiliary: Diffuse hepatic steatosis identified. No focal liver abnormality. The gallbladder is normal. No biliary dilatation. Pancreas: Negative. Spleen: Negative. Adrenals/Urinary Tract: The adrenal glands are normal. The kidneys are unremarkable. Urinary bladder is collapsed around a Foley catheter balloon. Stomach/Bowel: Normal appearance of the stomach. The proximal small bowel loops have a normal caliber. No bowel wall thickening or dilatation. There is diffuse wall thickening involving the colon. 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 Vascular/Lymphatic: Normal appearance of the abdominal aorta. No enlarged retroperitoneal or mesenteric adenopathy. No enlarged pelvic or inguinal lymph nodes. Reproductive: The uterus and the adnexal structures are unremarkable. Other: Within the left hemi abdomen there is a well-circumscribed fluid collection measuring 4.6 x 7.7 x 12.0 cm. This appears to exert mass effect upon the adjacent left upper quadrant small bowel loops. There is surrounding fating. stranding. Several smaller fluid collections are identified within the left lower quadrant and central pelvis. The largest  of these is along the fundus of the uterus measuring 5.5 x 1.6 x 2.3 cm. Diffuse body wall edema is identified compatible with anasarca. Open ventral abdominal wall wound is identified with drain in place. Musculoskeletal: Unremarkable. IMPRESSION: 1. Examination is remarkable for several abscesses within the abdomen and pelvis. The largest is in the left hemi abdomen and has mass effect upon the small bowel loops.  This has a maximum dimension of 12 cm. 2. 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 The remaining colon is diffusely edematous. 3. Hepatic steatosis. This appears progressive when compared with previous imaging. 4. No evidence to suggest small bowel obstruction. Electronically Signed   By: Kerby Moors M.D.   On: 07/20/2015 15:55    Anti-infectives: Anti-infectives    Start     Dose/Rate Route Frequency Ordered Stop   07/14/15 1400  cephALEXin (KEFLEX) capsule 250 mg     250 mg Oral 3 times per day 07/14/15 1042 07/18/15 2159   06/30/15 2000  ciprofloxacin (CIPRO) tablet 250 mg  Status:  Discontinued     250 mg Oral 2 times daily 06/30/15 1716 07/02/15 1418       Assessment/Plan  Perforated descending colon, colitis POD 38,40 s/p ex lap with DRAINAGE OF INTRA-ABDOMINAL ABSCESS, PARTIAL COLON RESECTION, re-exploration and creation of colostomy and abdominal closure---Dr. Lanier Clam - wound is clean. Cont wound VAC -stoma--mucocutaneous separation, pink and viable, no purulent drainage today  -miralax -up with therapies -abd binder  -CT scan results noted.  Pt going today for drain placement.  She has no significant abdominal pain, WBC, or fevers.  Would wait and see what is drained prior to initiating abx therapy given above clinical and objective findings.    LOS: 25 days    Jozalyn Baglio E 07/21/2015, 8:20 AM Pager: [phone]

## 2015-07-21 NOTE — Plan of Care (Signed)
Problem: RH SAFETY Goal: RH STG ADHERE TO SAFETY PRECAUTIONS W/ASSISTANCE/DEVICE STG Adhere to Safety Precautions With minimal Assistance/Device.  Outcome: Not Met (add Reason) Patient transferred to 5 W after procedure

## 2015-07-21 NOTE — Discharge Summary (Signed)
NAMEMARQUISE, LAMBSON                 ACCOUNT NO.:  [id number]  MEDICAL RECORD NO.:  [id number]  LOCATION:  4M10C                        FACILITY:  [facility]  PHYSICIAN:  Charlett Blake, M.D.DATE OF BIRTH:  12-Sep-1981  DATE OF ADMISSION:  06/19/2015 DATE OF DISCHARGE:  07/21/2015                              DISCHARGE SUMMARY   DISCHARGE DIAGNOSES: 1. Functional deficits secondary to debilitation related to Crohn     disease with perforated colon, peritonitis, exploratory laparotomy,     drainage of intra-abdominal abscesses. 2. Subcutaneous heparin for DVT prophylaxis. 3. Pain management. 4. Acute blood loss anemia. 5. Decreased nutritional storage. 6. Depression. 7. Acute renal insufficiency. 8. Supraventricular tachycardia. 9. Chronic systolic congestive heart failure. 10.Morbid obesity. 11.Mild hypokalemia. 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  HISTORY OF PRESENT ILLNESS:  This is a 34 year old right-handed morbidly obese female admitted on June 04, 2015.  History of chronic systolic congestive heart failure, recurrent colitis since January, 2016.  Lives independently prior to admission.  Presented with severe abdominal pain, occasional nausea and vomiting, placed on intravenous Cipro as well as Flagyl.  Sigmoidoscopy showed severe chronic inflammatory changes in the proximal sigmoid and descending colon. Biopsies diagnosed with Crohn disease, maintained on Solu-Medrol as well as mesalamine.  CT angiogram imaging revealed free intraperitoneal air, perforated viscus, likely perforated left colon from Crohn's colitis. Underwent exploratory laparotomy, drainage of intraabdominal abscesses, colon resection, application of wound VAC, creation of colostomy. Follow up wound care.  HOSPITAL COURSE:  Intubation due to acute respiratory insufficiency, extubated on June 14, 2015.  Acute blood loss anemia, she has been transfused.  Acute renal  insufficiency, baseline creatinine 2.6, 42.84. Renal ultrasound, no hydronephrosis.  Bouts of SVT, she responded to intravenous Cardizem.  Echocardiogram with ejection fraction 33%, normal systolic function.  Subcutaneous heparin for DVT prophylaxis.  The patient was admitted for a comprehensive rehab program.  PAST MEDICAL HISTORY:  See discharge diagnoses.  SOCIAL HISTORY:  Lives with young children.  Independent prior to admission.  Functional status upon admission to rehab services was +2 physical assist, sit to stand as well as rolling in the bed; total max assist for ADLs.  PHYSICAL EXAMINATION:  VITAL SIGNS:  Blood pressure 99/51, pulse 91, temperature 98, respirations 18. GENERAL:  Alert female, flat affect, oriented x3. LUNGS:  Clear to auscultation.  No respiratory distress. CARDIAC:  Rate controlled without murmur. ABDOMEN:  Obese, soft, colostomy in place.  Incision with dressing in place.  [place] COURSE:  The patient was admitted to inpatient rehab services with therapies initiated on a 3-hour daily basis consisting of physical therapy, occupational therapy, and rehab nursing.  The following issues were addressed during the patient's rehabilitation stay.  Pertaining to Mrs. Mckissic's debilitation related to Crohn disease, perforated colon, colostomy in place, drainage of multiple intra-abdominal abscesses.  Followed by Gastroenterology Services with recent endoscopy completed, was truly unremarkable except for a small hiatal hernia, mild edematous folds throughout, tortuous duodenum, jejunum junction, ligament of Treitz, or increased edema was unable to advance past that turn.  CT completed showing abscess, obstructing ligament of Treitz along with her endoscopic findings were all discussed with the  patient.  Interventional Radiology was consulted for drainage tubes, possible need for surgical intervention.  All issues discussed at length with the  patient.  She had been on subcutaneous heparin for DVT prophylaxis.  No bleeding episodes.  Acute blood loss anemia, stable and monitored, no current need for transfusion.  She was placed on Ritalin as well as Lexapro to help stimulate the mood. Decreased nutritional storage, likely need for TPN.  Followed by Vascular Surgery for dry gangrene right fifth digit due to microembolism.  Monitored for any demarcation.  No surgical intervention at this time.  Mild hypokalemia, potassium supplement is indicated. Therapies had been ongoing, working with tilt-table, close monitoring for hypotension.  Very minimal progress needing some encouragement to participate as well as multiple medical conditions.  She could transfer back to a shuttle chair with a maxi move.  Due to these recent findings of endoscopy and imaging, the patient discharged to acute care services to address all medical conditions.  Interventional Radiology had also been consulted to follow.  Medical condition at time of discharge was guarded.  All medication changes made as per medical team.     Lauraine Rinne, P.A.   ______________________________ Charlett Blake, M.D.    DA/MEDQ  D:  07/21/2015  T:  07/21/2015  Job:  [id number]

## 2015-07-21 NOTE — Discharge Summary (Signed)
Discharge summary job # [id number]

## 2015-07-21 NOTE — Plan of Care (Signed)
Problem: RH KNOWLEDGE DEFICIT Goal: RH STG INCREASE KNOWLEDGE OF DIABETES Outcome: Not Met (add Reason) Patient transferred to 60 W after a procedure

## 2015-07-21 NOTE — Progress Notes (Signed)
Initial Nutrition Assessment  DOCUMENTATION CODES:   Morbid obesity  INTERVENTION:   -Ensure Enlive po TID, each supplement provides 350 kcal and 20 grams of protein -MVI daily -If pt requires nutrition support, recommend:  Initiate Osmolite 1.5 @ 20 ml/hr and increase by 10 ml every 4 hours to goal rate of 55 ml/hr.   60 ml Prostat BID.    Tube feeding regimen provides 2380 kcal (100% of needs), 143 grams of protein, and 1006 ml of H2O.   NUTRITION DIAGNOSIS:   Increased nutrient needs related to wound healing as evidenced by estimated needs.  GOAL:   Patient will meet greater than or equal to 90% of their needs  MONITOR:   PO intake, Supplement acceptance, Labs, Weight trends, Skin, I & O's  REASON FOR ASSESSMENT:   Other (Comment)    ASSESSMENT:   This is a 34 year old female patient who was recently admitted to the acute care portion of the hospital on 9/11 secondary to severe abdominal pain related to perforated sigmoid colon in the setting of new diagnosis of Crohn's colitis. According to the EMR, the patient had been having recurrent abdominal symptoms felt to be either diverticulitis or colitis since January 2016  Pt admitted from CIR with abdominal pain secondary to post-operative intra-abdominal abscess.   Pt underwent CT guided peritoneal abscess drainage on 07/21/15; sample sent for culture and perc drain placed.  Pt was sleeping soundly at time of visit and did not respond to name being called or wake when exam was performed.   Spoke with CIR RD, who reports that pt has had a very poor appetite since being in CIR. She reveals pt has had very poor po intake, as her stomach hurts when she consumes most foods. Intake has been consisting mainly of Ensure shakes. Per CIR RD, rehab team was considering potential for nutrition support.   Pt with abdominal wound, connected to wound vac at time of visit. Pt was also on air mattress. Pt with increased nutrient needs  due to wound healing.  Reviewed wt hx; pt with ongoing wt loss over the past 8 months, including a 88# (20.9%) wt loss over the past 6 months.   Nutrition-Focused physical exam completed. Findings are no fat depletion, no muscle depletion, and no edema.   Noted pt with Boost Breeze ordered; RD will order Ensure, due to caloric density and pt preference.   Labs reviewed: Mg: 1.3.   Diet Order:  Diet regular Room service appropriate?: Yes; Fluid consistency:: Thin  Skin:  Wound (see comment) (abdominal wound vac)  Last BM:  07/20/15  Height:   Ht Readings from Last 1 Encounters:  07/21/15 5' 2"  (1.575 m)    Weight:   Wt Readings from Last 1 Encounters:  07/21/15 332 lb (150.594 kg)    Ideal Body Weight:  50 kg  BMI:  Body mass index is 60.71 kg/(m^2).  Estimated Nutritional Needs:   Kcal:  2250-2450  Protein:  130-145 grams  Fluid:  >2.2 L  EDUCATION NEEDS:   No education needs identified at this time  Johaan Ryser A. Jimmye Norman, RD, LDN, CDE Pager: [phone] After hours Pager: [phone]

## 2015-07-21 NOTE — Procedures (Signed)
Interventional Radiology Procedure Note  Procedure:  CT guided peritoneal abscess drainage  Complications:  None  Estimated Blood Loss: < 10 mL  Aspiration at level of left lateral peritoneal abscess yielded grossly purulent fluid. Sample sent for culture. 12 Fr perc drain placed and attached to suction bulb drainage. Will follow output.  Venetia Night. Kathlene Cote, M.D Pager:  [phone]

## 2015-07-21 NOTE — Progress Notes (Signed)
Patient going to Interventional Radiology for a procedure, and after patient to be transferred to 5 W room 35. Given report to [place], Therapist, sports. All patient's belongings packed by nurse tech and taken to 5 W 14. [place]

## 2015-07-21 NOTE — Consult Note (Signed)
WOC follow-up: Ostomy pouching: Ostomy pouch changed today related to leakage behind barrier.Applied 2 piece pouch with barrier ring. Stoma is 100% red and moist, slightly above skin level, 1 1/2 inches, bleeding slightly, mucutaneous separation occurring to 50% of peristomal edges, from 6:00 o'clock to 12:00 o'clock with mod amt stool in the peristomal wound, granulating tissue underneath when stool is absorbed. Mod amt amt semi-formed brown stool in pouch. Education provided: Demonstrated pouch change using 2 piece pouch and a barrier ring to attempt to maintain seal. Applied strip of Aquacel packing into separation edges of stoma. Enrolled patient in [place] program: No Ostomy supplies and instructions at the bedside for nurse use. Pt watched procedure and asked appropriate questions. She will need total assistance with pouching upon discharge until this complex problem is resolved.  Wound type: Full thickness midline abd wound, CCS following for assessment and plan of care, PA at bedside to assess wound and ostomy appearance; Changed Vac dressing, mod amt reddish-tan drainage in the cannister, no further strong odor. Full thickness post-op wound 95% red, 5% yellow slough in patchy areas. Applied Mepitel contact layer, then one piece of white foam to lower inner wound, then one piece of black foam to 129m cont suction.Pt tolerated with mod amt discomfort. Plan to change dressing on M/W/F at 0800.  DJulien GirtMSN, RN, CWOCN, CWCN-AP, CNS

## 2015-07-21 NOTE — Plan of Care (Signed)
Problem: RH SKIN INTEGRITY Goal: RH STG MAINTAIN SKIN INTEGRITY WITH ASSISTANCE STG Maintain Skin Integrity With max Assistance.  Outcome: Not Met (add Reason) Patient transferred to 5 W

## 2015-07-21 NOTE — H&P (Signed)
[facility] History and Physical                                                                                    Dominique [name], is a 34 y.o. female  MRN: [id number]   DOB - [birth year]  Admit Date - 07/21/2015  Outpatient Primary MD for the patient is Benito Mccreedy, MD  Referring MD: Letta Pate / Rehab medicine  Consulting M.D: [facility] / Gastroenterology; Georgette Dover / General Surgery; Beverly Gust / Psychologist  With History of -  Past Medical History  Diagnosis Date  . Asthma   . Sleep apnea   . Anemia   . Morbid obesity ([place]) 03/22/2009    Qualifier: Diagnosis of  By: Ronnald Ramp CMA, [place]    . OSA (obstructive sleep apnea) 05/02/2014  . Thyromegaly 05/02/2014  . Seasonal allergies 06/13/2014  . Acute acalculous cholecystitis 11/16/2014  . Diverticulitis of colon 11/17/2014  . Hepatic steatosis 11/17/2014  . Sickle cell anemia ([place])     She states she " has the trait" (07/20/15)  . CHF (congestive heart failure) [facility])       Past Surgical History  Procedure Laterality Date  . C section 2011  2011  . Flexible sigmoidoscopy N/A 06/05/2015    Procedure: FLEXIBLE SIGMOIDOSCOPY;  Surgeon: Ronald Lobo, MD;  Location: [place];  Service: Endoscopy;  Laterality: N/A;  . Laparotomy N/A 06/09/2015    Procedure: EXPLORATORY LAPAROTOMY, DRAINAGE OF INTRAABDOMINAL ABSCESS, PARTIAL COLON RESECTION,  APPLICATION OF WOUND VAC;  Surgeon: Georganna Skeans, MD;  Location: [place];  Service: General;  Laterality: N/A;  . Laparotomy N/A 06/11/2015    Procedure: RE-EXPLORATION OF ABDOMEN;  Surgeon: Georganna Skeans, MD;  Location: [place];  Service: General;  Laterality: N/A;  . Colostomy N/A 06/11/2015    Procedure:  CREATION OF COLOSTOMY;  Surgeon: Georganna Skeans, MD;  Location: [place];  Service: General;  Laterality: N/A;  . Esophagogastroduodenoscopy N/A 07/19/2015    Procedure: ESOPHAGOGASTRODUODENOSCOPY (EGD);  Surgeon: Clarene Essex, MD;  Location: [facility] ENDOSCOPY;  Service: Endoscopy;  Laterality: N/A;     in for abdominal pain secondary to postoperative intra-abdominal abscess    HPI This is a 34 year old female patient who was recently admitted to the acute care portion of the hospital on 9/11 secondary to severe abdominal pain related to perforated sigmoid colon in the setting of new diagnosis of Crohn's colitis. According to the EMR, the patient had been having recurrent abdominal symptoms felt to be either diverticulitis or colitis since January 2016 which required admission in February 2016. Her symptoms were quiescent until May/June 2016 when she redeveloped anorexia with associated nausea vomiting and diarrhea. She had been unable to see a gastroenterologist in the outpatient setting because of lack of insurance and referral issues but just prior to the September admission she was finally able to see a gastroenterologist. During that evaluation her most recent CT scan had been reviewed and the plan was to continue antibiotics and to return for follow-up. Unfortunately in the interim she had developed severe abdominal pain and presented to the emergency department for evaluation. Initial workup revealed perforated left colon with peritonitis and peritoneal abscess requiring emergent expiratory laparotomy on  9/16 with partial colon resection and drainage of intra-abdominal abscess. She returned to the OR on 9/18 for reexploration of the abdomen with closure and mobilization of the splenic flexure and creation of colostomy. In the immediate postop period patient had septic shock and required short-term ventilation but was eventually stabilized and transitioned out to step down and then to the general medical floor. During the course of the hospitalization an echocardiogram was obtained that demonstrated an ejection fraction of 20% with diffuse hypokinesis which was new for this patient compared to previous echocardiogram in 2011. Etiology was felt to be secondary to stress critical illness and recent  septic shock. Cardiology evaluated the patient and anticipated improvement in ejection fraction over time. She also has underlying persistent sinus tachycardia and was on beta blockers prior to admission. Cardiology also added twice a day Lasix during this hospitalization. Patient was also transfused packed red blood cells due to anemia of critical illness and hemoglobin has remained stable around 7.7-8.0. She also had issues with acute renal failure secondary to septic shock and ATN with peak creatinine of 2.8 with creatinine at time of discharge to rehabilitation 1.0. Patient reported issues with recurrent chronic anorexia with associated nausea vomiting diarrhea and abdominal pain dating back to February 2016 and during this hospitalization patient's laboratory data supported severe malnutrition with an acute on chronic component. She was subsequent discharged to the rehabilitation unit on 06/26/15.  Unfortunately since arrival to the rehabilitation unit patient has not thrived. She has continued to require a Foley catheter. She has struggled with failure to thrive and profound depression prompting nutritional as well as psychological consultation with the addition of appropriate antidepressant medications. She has also had issues of recurrent nausea and vomiting and inability to tolerate solid diet as well as liquids. This prompted gastroenterology consultation on 10/24. She eventually underwent EGD on 10/26 which revealed a small hiatal hernia as well as mildly edematous folds throughout. She was also found to have either tortuous duodenum jejunum junction at ligament of Treitz or increased edema and the gastroenterologist was unable to advance the scope passed that turn. This prompted a contrasted CT of the abdomen and pelvis which revealed an abscess (4.6 x 7.7 x 12 cm) obstructing her ligament of Treitz which goes along with her endoscopic findings. In addition there were several smaller fluid collections  identified within the left lower quadrant and central pelvis with the largest measuring 5.5  X 1.6  X 2.3 cm. Gastroenterology discussed with interventional radiology who plans to evaluate the patient for possible percutaneous drainage. Surgery has also evaluated the patient today and recommends holding off on antibiotics until after fluid collection could be drained and culture results have returned since patient is afebrile, hemodynamically stable and has no leukocytosis. Because of these above issues rehabilitative medicine has requested the patient transfer back to the acute care setting and we will reassume care of this patient as attending physicians.   Review of Systems   In addition to the HPI above,  No Fever-chills, myalgias or other constitutional symptoms No Headache, changes with Vision or hearing, new weakness, tingling, numbness in any extremity, No indigestion/reflux No Chest pain, Cough or Shortness of Breath, palpitations, orthopnea or DOE No no melena or hematochezia, no dark tarry stools No dysuria, hematuria or flank pain No new skin rashes, lesions, masses or bruises, No new joints pains-aches No recent weight gain or loss No polyuria, polydypsia or polyphagia,  *A full 10 point Review of Systems was done,  except as stated above, all other Review of Systems were negative.  Social History Social History  Substance Use Topics  . Smoking status: Former Smoker -- 0.50 packs/day for 4 years    Types: Cigarettes    Start date: 07/24/2014  . Smokeless tobacco: Never Used  . Alcohol Use: No    Resides at: Private residence  Lives with: Alone prior to this admission  Ambulatory status: Prior to admission was without assistive devices but currently requires 2+ assist with minimal distance ambulated utilizing walker-also requires assistance to sit upright in bed but once upright able to maintain trunk stability and perform minor IADLs/ADL   Family History Family History   Problem Relation Age of Onset  . Hypertension Mother   . Hypertension Father   . Hypertension Sister   . Asthma Brother   . Diabetes Maternal Grandmother   . Hypertension Maternal Grandmother   . Hypertension Maternal Grandfather   . Cancer Paternal Grandmother     lung  . Emphysema Paternal Grandfather   . Allergies Mother      Prior to Admission medications   Medication Sig Start Date End Date Taking? Authorizing Provider  albuterol (PROVENTIL) (2.5 MG/3ML) 0.083% nebulizer solution Take 3 mLs (2.5 mg total) by nebulization every 6 (six) hours as needed for wheezing or shortness of breath. Patient not taking: Reported on 06/03/2015 10/27/14   Rigoberto Noel, MD  ferrous sulfate 325 (65 FE) MG tablet Take 2 tablets (650 mg total) by mouth daily with breakfast. 04/07/15   Midge Minium, MD  furosemide (LASIX) 40 MG tablet Take 1 tablet (40 mg total) by mouth 2 (two) times daily. 06/26/15   Verlee Monte, MD  mesalamine (PENTASA) 250 MG CR capsule Take 2 capsules (500 mg total) by mouth 4 (four) times daily. 06/26/15   Verlee Monte, MD  metoprolol succinate (TOPROL XL) 50 MG 24 hr tablet Take 1 tablet (50 mg total) by mouth daily. Take with or immediately following a meal. 06/26/15   Verlee Monte, MD  ondansetron (ZOFRAN) 4 MG tablet Take 1 tablet (4 mg total) by mouth every 6 (six) hours. 05/25/15   Heather Laisure, PA-C  polyethylene glycol (MIRALAX / GLYCOLAX) packet Take 17 g by mouth daily. 06/26/15   Verlee Monte, MD  PROAIR HFA 108 (90 BASE) MCG/ACT inhaler INHALE 2 PUFFS BY MOUTH INTO THE LUNGS EVERY 6 HOURS AS NEEDED FOR WHEEZING OR SHORTNESS OF BREATH 02/17/15   Midge Minium, MD  saccharomyces boulardii (FLORASTOR) 250 MG capsule Take 1 capsule (250 mg total) by mouth 2 (two) times daily. 06/26/15   Verlee Monte, MD    Allergies  Allergen Reactions  . Other Shortness Of Breath and Swelling    Tree nuts  . Penicillins Other (See Comments)    Unknown childhood allergy     Physical Exam  Vitals  Last menstrual period 05/25/2015.   General:  In no acute distress, appears stated age and morbidly obese  Psych:  Normal affect, Denies Suicidal or Homicidal ideations, Awake Alert, Oriented X 3. Speech and thought patterns are clear and appropriate, no apparent short term memory deficits  Neuro:   No focal neurological deficits, CN II through XII intact, Strength 4/5 all 4 extremities, Sensation intact all 4 extremities. Gait/ ambulation were not assessed  ENT:  Ears and Eyes appear Normal, Conjunctivae clear, PER. Moist oral mucosa without erythema or exudates.  Neck:  Supple, No lymphadenopathy appreciated  Respiratory:  Symmetrical chest wall movement, Good air  movement bilaterally, CTAB. Room Air  Cardiac:  RRR and slightly tachycardic, No Murmurs, no LE edema noted, no JVD, No carotid bruits, peripheral pulses palpable at 2+  Abdomen:  Positive bowel sounds, Soft, diffusely tender without guarding or rebounding, midline abdominal incision open and approximated with wound VAC, left lateral abdominal stoma pink with clear green fluid returns to bag, Non distended,  No masses appreciated, no obvious hepatosplenomegaly  Genitourinary: Foley catheter to bedside bag with cloudy yellow drainage noted  Skin:  No Cyanosis, Normal Skin Turgor, No Skin Rash or Bruise.  Extremities: Symmetrical without obvious trauma or injury,  no effusions. PICC line right upper extremity-single-lumen-site unremarkable  Data Review  CBC  Recent Labs Lab 07/16/15 0450 07/17/15 0646 07/20/15 0800 07/21/15 0440  WBC 11.1* 11.7* 10.4 10.5  HGB 7.5* 8.0* 7.7* 7.6*  HCT 25.3* 27.2* 25.2* 25.0*  PLT 462* 464* 413* 450*  MCV 88.5 90.4 90.6 90.6  MCH 26.2 26.6 27.7 27.5  MCHC 29.6* 29.4* 30.6 30.4  RDW 20.6* 20.0* 19.4* 19.4*  LYMPHSABS  --  1.9  --   --   MONOABS  --  1.8*  --   --   EOSABS  --  0.1  --   --   BASOSABS  --  0.0  --   --     Chemistries   Recent  Labs Lab 07/16/15 0450 07/17/15 0426 07/20/15 0800 07/21/15 0440  NA 138 140 138 135  K 3.6 4.4 3.1* 2.8*  CL 97* 99* 97* 95*  CO2 33* 32 34* 32  GLUCOSE 88 83 95 82  BUN _0 CREATININE 1.04* 0.91 1.17* 1.17*  CALCIUM 7.3* 7.4* 7.3* 7.0*  AST  --   --  37  --   ALT  --   --  28  --   ALKPHOS  --   --  263*  --   BILITOT  --   --  0.8  --     estimated creatinine clearance is 95.9 mL/min (by C-G formula based on Cr of 1.17).  No results for input(s): TSH, T4TOTAL, T3FREE, THYROIDAB in the last 72 hours.  Invalid input(s): FREET3  Coagulation profile  Recent Labs Lab 07/21/15 0440  INR 1.57*    No results for input(s): DDIMER in the last 72 hours.  Cardiac Enzymes  Recent Labs Lab 07/15/15 2229  CKMB 1.0  TROPONINI <0.03    Invalid input(s): POCBNP  Urinalysis    Component Value Date/Time   COLORURINE AMBER* 07/12/2015 1005   APPEARANCEUR CLOUDY* 07/12/2015 1005   LABSPEC 1.018 07/12/2015 1005   PHURINE 5.0 07/12/2015 1005   GLUCOSEU NEGATIVE 07/12/2015 1005   HGBUR LARGE* 07/12/2015 1005   BILIRUBINUR SMALL* 07/12/2015 1005   KETONESUR 15* 07/12/2015 1005   PROTEINUR NEGATIVE 07/12/2015 1005   UROBILINOGEN 1.0 07/12/2015 1005   NITRITE POSITIVE* 07/12/2015 1005   LEUKOCYTESUR MODERATE* 07/12/2015 1005    Imaging results:   Ct Abdomen Pelvis Wo Contrast  06/23/2015  CLINICAL DATA:  Wound infection. History of perforated left colon with peritonitis and peritoneal abscess. Crohn's disease . Laparotomy 06/11/2015 and 06/09/2015 EXAM: CT ABDOMEN AND PELVIS WITHOUT CONTRAST TECHNIQUE: Multidetector CT imaging of the abdomen and pelvis was performed following the standard protocol without IV contrast. COMPARISON:  CT 06/04/2015 FINDINGS: Lower chest: New mild bibasilar atelectasis without pleural effusion. Hepatobiliary: Limited evaluation of the liver due to her large patient size and lack of intravenous contrast. No liver lesion. Gallbladder bile  ducts intact. Pancreas: Mild pancreatic enlargement compared to the prior study with mild edema below the uncinate process. Possible pancreatitis. Spleen: Negative Adrenals/Urinary Tract: Negative for renal obstruction or stone. Stomach/Bowel: Negative for bowel obstruction. No significant ileus. Left colostomy. Question mild edema in the right and transverse colon, not present previously. This could be due to colitis or possibly lack of adequate distention. No intra-abdominal abscess. Vascular/Lymphatic: Negative Reproductive: Normal uterus.  No adnexal mass. Other: Small amount of free fluid in the left pelvis.  No abscess. Open wound in the midline down to the fascia. No abscess in the soft tissues. Musculoskeletal: Negative IMPRESSION: Postop left colectomy with left colostomy. Mild thickening of the right and transverse colon may reflect colitis. These areas did not show thickening on the prior study and therefore not likely to represent Crohn's colitis. Small amount of free fluid in the left pelvis.  Negative for abscess Open wound in the anterior abdominal wall. Question mild pancreatitis with mild pancreatic edema. Limited evaluation due to lack of IV contrast large patient size. Mild atelectasis in the lung bases. Electronically Signed   By: Franchot Gallo M.D.   On: 06/23/2015 19:53   Ct Abdomen Pelvis W Contrast  07/20/2015  CLINICAL DATA:  34 year old. Vomiting. Recent colon surgery. Patient unable to tolerate p.o. contrast. EXAM: CT ABDOMEN AND PELVIS WITH CONTRAST TECHNIQUE: Multidetector CT imaging of the abdomen and pelvis was performed using the standard protocol following bolus administration of intravenous contrast. CONTRAST:  167m OMNIPAQUE IOHEXOL 300 MG/ML  SOLN COMPARISON:  06/23/2015 FINDINGS: Lower chest: No pleural effusion identified. There is atelectasis noted in the left base. Hepatobiliary: Diffuse hepatic steatosis identified. No focal liver abnormality. The gallbladder is normal.  No biliary dilatation. Pancreas: Negative. Spleen: Negative. Adrenals/Urinary Tract: The adrenal glands are normal. The kidneys are unremarkable. Urinary bladder is collapsed around a Foley catheter balloon. Stomach/Bowel: Normal appearance of the stomach. The proximal small bowel loops have a normal caliber. No bowel wall thickening or dilatation. There is diffuse wall thickening involving the colon. 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 Vascular/Lymphatic: Normal appearance of the abdominal aorta. No enlarged retroperitoneal or mesenteric adenopathy. No enlarged pelvic or inguinal lymph nodes. Reproductive: The uterus and the adnexal structures are unremarkable. Other: Within the left hemi abdomen there is a well-circumscribed fluid collection measuring 4.6 x 7.7 x 12.0 cm. This appears to exert mass effect upon the adjacent left upper quadrant small bowel loops. There is surrounding fating. stranding. Several smaller fluid collections are identified within the left lower quadrant and central pelvis. The largest of these is along the fundus of the uterus measuring 5.5 x 1.6 x 2.3 cm. Diffuse body wall edema is identified compatible with anasarca. Open ventral abdominal wall wound is identified with drain in place. Musculoskeletal: Unremarkable. IMPRESSION: 1. Examination is remarkable for several abscesses within the abdomen and pelvis. The largest is in the left hemi abdomen and has mass effect upon the small bowel loops. This has a maximum dimension of 12 cm. 2. 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 The remaining colon is diffusely edematous. 3. Hepatic steatosis. This appears progressive when compared with previous imaging. 4. No evidence to suggest small bowel obstruction. Electronically Signed   By: TKerby MoorsM.D.   On: 07/20/2015 15:55     Assessment & Plan  Principal Problem:   Postoperative intra-abdominal abscess 2/2  perforated sigmoid colon in setting of new Crohn's disease -Admit to surgical floor -  Continue wound VAC -Continue routine ostomy care -IR evaluation pending regarding possible percutaneous drainage of above identified fluid collection-per general surgery note: since no leukocytosis or fever do not recommend initiating empiric antibiotics and instead waiting on collection of specimen; if cloudy or appears infected then can begin empiric therapy -Patient still reporting abdominal pain so provide when necessary medication: Tylenol, oxycodone and IV morphine -NPO until after evaluated by IR  ** 1233 pm: CT-guided peritoneal abscess drainage in interventional radiology with subsequent drain placement; grossly purulent fluid was obtained and sample sent for culture-begin empiric Cipro and Flagyl IV  Active Problems:   Severe left ventricular systolic dysfunction -New finding this admission in setting of septic shock and critical illness with EF of 20% -Has been 4 weeks since last echocardiogram so we'll obtain repeat echocardiogram to see if ejection fraction has improved/recovered -NPO with poor oral intake so holding Lasix for now -Compensated at present without signs of heart failure on exam    Hypokalemia -Likely related to recurrent nausea and vomiting as well as administration of Lasix without potassium supplementation -Currently not tolerating oral intake and also is NPO therefore we'll add potassium to maintenance fluid and give for runs of 10 mEq IV potassium -Repeat be met at 6 PM -Check magnesium and phosphorus as well    OSA/OHA/pulmonary hypertension -Patient continues to refuse utilization of CPAP at hour of sleep    Recent E. Coli UTI -Just completed a course of Keflex on 10/25 (Fluoroquinolone resistant) -Repeat urinalysis and culture -Hopeful can remove catheter soon although concerning with fluid collection in pelvis/uterus area near bladder which may cause inflammation that  could contribute to urinary retention    Sinus tachycardia (HCC) -Likely related to underlying deconditioning as well as ongoing pain and anxiety -Continue preadmission beta blocker    Adjustment disorder with mixed anxiety and depressed mood -Has been started on Lexapro and Ritalin this admission by psychology -Continue to follow -Certainly contributing to patient's overall well-being and ability to participate in rehabilitative therapies    Morbid obesity- BMI 67.5/protein calorie malnutrition severe -Weight dating back to April 2016 410 pounds with current weight 332 pounds -Most recent preop human 6.7 with an albumin of 1.1 on 10/27 -Continue nutrition evaluation and treatment -Resume protein supplementation once diet resumed -Monitor for improvement in GI symptoms post drainage of intra-abdominal abscess; if does not improve may require TNA -Also mildly coagulopathic and likely related to poor nutrition as well    Hepatic steatosis -Most recent LFTs normal except for elevated alkaline phosphatase of 263 -Follow closely if required to initiate parenteral nutrition    Iron deficiency anemia -Did require transfusion postoperatively -Hemoglobin baseline at this time appears to be between 7.5 and 7.7 -Certainly influenced by poor nutrition -Check anemia panel      Physical deconditioning -Continue PT/OT -Once medically stable reevaluate for CIR    DVT Prophylaxis: Subcutaneous heparin  Family Communication:   No family at bedside although mother on telephone during our initial examination and discussion with the patient and was able to hear our discussion with the patient  Code Status:  Full code  Condition:  Stable  Discharge disposition: Hopeful that once medically stable can return to CIR; if unable to return to that rehabilitation setting may need to be evaluated for short-term SNF rehabilitation  Time spent in minutes : 60      ELLIS,ALLISON L. ANP on 07/21/2015  at 9:41 AM  Between 7am to 7pm - Pager - [phone]  After  7pm go to [email or web address] - password [email or web address]  And look for the night coverage person covering me after hours  [facility]

## 2015-07-21 NOTE — Plan of Care (Signed)
Problem: RH SKIN INTEGRITY Goal: RH STG SKIN FREE OF INFECTION/BREAKDOWN Free of skin breakdown and infection mod assist.  Outcome: Not Met (add Reason) Patient transferred to 5W 35 after procedure to drain abcesses

## 2015-07-21 NOTE — Progress Notes (Signed)
Subjective/Complaints: Discussed results of CT abd.  Essentially no calories po, low K likely nutritional, appreciate GI note.  Has not been participating in therapy, no energy plus severe nausea unresponsive to zofran even while NPO  ROS: Denies CP, SOB, + nausea and -vomiting Objective: Vital Signs: Blood pressure 100/57, pulse 105, temperature 98.6 F (37 C), temperature source Oral, resp. rate 17, weight 151 kg (332 lb 14.3 oz), last menstrual period 05/25/2015, SpO2 100 %. Ct Abdomen Pelvis W Contrast  07/20/2015  CLINICAL DATA:  34 year old. Vomiting. Recent colon surgery. Patient unable to tolerate p.o. contrast. EXAM: CT ABDOMEN AND PELVIS WITH CONTRAST TECHNIQUE: Multidetector CT imaging of the abdomen and pelvis was performed using the standard protocol following bolus administration of intravenous contrast. CONTRAST:  177m OMNIPAQUE IOHEXOL 300 MG/ML  SOLN COMPARISON:  06/23/2015 FINDINGS: Lower chest: No pleural effusion identified. There is atelectasis noted in the left base. Hepatobiliary: Diffuse hepatic steatosis identified. No focal liver abnormality. The gallbladder is normal. No biliary dilatation. Pancreas: Negative. Spleen: Negative. Adrenals/Urinary Tract: The adrenal glands are normal. The kidneys are unremarkable. Urinary bladder is collapsed around a Foley catheter balloon. Stomach/Bowel: Normal appearance of the stomach. The proximal small bowel loops have a normal caliber. No bowel wall thickening or dilatation. There is diffuse wall thickening involving the colon. 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 Vascular/Lymphatic: Normal appearance of the abdominal aorta. No enlarged retroperitoneal or mesenteric adenopathy. No enlarged pelvic or inguinal lymph nodes. Reproductive: The uterus and the adnexal structures are unremarkable. Other: Within the left hemi abdomen there is a well-circumscribed fluid collection measuring 4.6 x 7.7 x 12.0  cm. This appears to exert mass effect upon the adjacent left upper quadrant small bowel loops. There is surrounding fating. stranding. Several smaller fluid collections are identified within the left lower quadrant and central pelvis. The largest of these is along the fundus of the uterus measuring 5.5 x 1.6 x 2.3 cm. Diffuse body wall edema is identified compatible with anasarca. Open ventral abdominal wall wound is identified with drain in place. Musculoskeletal: Unremarkable. IMPRESSION: 1. Examination is remarkable for several abscesses within the abdomen and pelvis. The largest is in the left hemi abdomen and has mass effect upon the small bowel loops. This has a maximum dimension of 12 cm. 2. 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 The remaining colon is diffusely edematous. 3. Hepatic steatosis. This appears progressive when compared with previous imaging. 4. No evidence to suggest small bowel obstruction. Electronically Signed   By: TKerby MoorsM.D.   On: 07/20/2015 15:55   Results for orders placed or performed during the hospital encounter of 06/26/15 (from the past 72 hour(s))  Glucose, capillary     Status: None   Collection Time: 07/18/15 12:03 PM  Result Value Ref Range   Glucose-Capillary 89 65 - 99 mg/dL  Glucose, capillary     Status: None   Collection Time: 07/18/15  6:27 PM  Result Value Ref Range   Glucose-Capillary 80 65 - 99 mg/dL  Glucose, capillary     Status: None   Collection Time: 07/18/15  8:58 PM  Result Value Ref Range   Glucose-Capillary 80 65 - 99 mg/dL  Glucose, capillary     Status: None   Collection Time: 07/19/15  6:41 AM  Result Value Ref Range   Glucose-Capillary 84 65 - 99 mg/dL  Glucose, capillary     Status: Abnormal   Collection Time: 07/19/15 11:20 AM  Result Value Ref Range   Glucose-Capillary 106 (H) 65 - 99 mg/dL  Glucose, capillary     Status: Abnormal   Collection Time: 07/19/15  4:24 PM  Result Value Ref Range    Glucose-Capillary 108 (H) 65 - 99 mg/dL   Comment 1 Notify RN   Glucose, capillary     Status: Abnormal   Collection Time: 07/19/15  9:34 PM  Result Value Ref Range   Glucose-Capillary 116 (H) 65 - 99 mg/dL  Glucose, capillary     Status: None   Collection Time: 07/20/15  6:47 AM  Result Value Ref Range   Glucose-Capillary 94 65 - 99 mg/dL  Comprehensive metabolic panel     Status: Abnormal   Collection Time: 07/20/15  8:00 AM  Result Value Ref Range   Sodium 138 135 - 145 mmol/L   Potassium 3.1 (L) 3.5 - 5.1 mmol/L   Chloride 97 (L) 101 - 111 mmol/L   CO2 34 (H) 22 - 32 mmol/L   Glucose, Bld 95 65 - 99 mg/dL   BUN 15 6 - 20 mg/dL   Creatinine, Ser 1.17 (H) 0.44 - 1.00 mg/dL   Calcium 7.3 (L) 8.9 - 10.3 mg/dL   Total Protein 5.0 (L) 6.5 - 8.1 g/dL   Albumin 1.1 (L) 3.5 - 5.0 g/dL   AST 37 15 - 41 U/L   ALT 28 14 - 54 U/L   Alkaline Phosphatase 263 (H) 38 - 126 U/L   Total Bilirubin 0.8 0.3 - 1.2 mg/dL   GFR calc non Af Amer 60 (L) >60 mL/min   GFR calc Af Amer >60 >60 mL/min    Comment: (NOTE) The eGFR has been calculated using the CKD EPI equation. This calculation has not been validated in all clinical situations. eGFR's persistently <60 mL/min signify possible Chronic Kidney Disease.    Anion gap 7 5 - 15  CBC     Status: Abnormal   Collection Time: 07/20/15  8:00 AM  Result Value Ref Range   WBC 10.4 4.0 - 10.5 K/uL   RBC 2.78 (L) 3.87 - 5.11 MIL/uL   Hemoglobin 7.7 (L) 12.0 - 15.0 g/dL   HCT 25.2 (L) 36.0 - 46.0 %   MCV 90.6 78.0 - 100.0 fL   MCH 27.7 26.0 - 34.0 pg   MCHC 30.6 30.0 - 36.0 g/dL   RDW 19.4 (H) 11.5 - 15.5 %   Platelets 413 (H) 150 - 400 K/uL  Prealbumin     Status: Abnormal   Collection Time: 07/20/15  8:00 AM  Result Value Ref Range   Prealbumin 6.7 (L) 18 - 38 mg/dL  Glucose, capillary     Status: None   Collection Time: 07/20/15 11:19 AM  Result Value Ref Range   Glucose-Capillary 97 65 - 99 mg/dL   Comment 1 Notify RN   Glucose,  capillary     Status: None   Collection Time: 07/20/15  4:18 PM  Result Value Ref Range   Glucose-Capillary 76 65 - 99 mg/dL   Comment 1 Notify RN   Glucose, capillary     Status: None   Collection Time: 07/20/15  9:34 PM  Result Value Ref Range   Glucose-Capillary 95 65 - 99 mg/dL  CBC     Status: Abnormal   Collection Time: 07/21/15  4:40 AM  Result Value Ref Range   WBC 10.5 4.0 - 10.5 K/uL   RBC 2.76 (L) 3.87 - 5.11 MIL/uL   Hemoglobin 7.6 (L) 12.0 -  15.0 g/dL   HCT 25.0 (L) 36.0 - 46.0 %   MCV 90.6 78.0 - 100.0 fL   MCH 27.5 26.0 - 34.0 pg   MCHC 30.4 30.0 - 36.0 g/dL   RDW 19.4 (H) 11.5 - 15.5 %   Platelets 450 (H) 150 - 400 K/uL  Protime-INR     Status: Abnormal   Collection Time: 07/21/15  4:40 AM  Result Value Ref Range   Prothrombin Time 18.9 (H) 11.6 - 15.2 seconds   INR 1.57 (H) 0.00 - 1.49  APTT     Status: Abnormal   Collection Time: 07/21/15  4:40 AM  Result Value Ref Range   aPTT 40 (H) 24 - 37 seconds    Comment:        IF BASELINE aPTT IS ELEVATED, SUGGEST PATIENT RISK ASSESSMENT BE USED TO DETERMINE APPROPRIATE ANTICOAGULANT THERAPY.   Basic metabolic panel     Status: Abnormal   Collection Time: 07/21/15  4:40 AM  Result Value Ref Range   Sodium 135 135 - 145 mmol/L   Potassium 2.8 (L) 3.5 - 5.1 mmol/L   Chloride 95 (L) 101 - 111 mmol/L   CO2 32 22 - 32 mmol/L   Glucose, Bld 82 65 - 99 mg/dL   BUN 15 6 - 20 mg/dL   Creatinine, Ser 1.17 (H) 0.44 - 1.00 mg/dL   Calcium 7.0 (L) 8.9 - 10.3 mg/dL   GFR calc non Af Amer 60 (L) >60 mL/min   GFR calc Af Amer >60 >60 mL/min    Comment: (NOTE) The eGFR has been calculated using the CKD EPI equation. This calculation has not been validated in all clinical situations. eGFR's persistently <60 mL/min signify possible Chronic Kidney Disease.    Anion gap 8 5 - 15  Glucose, capillary     Status: None   Collection Time: 07/21/15  6:47 AM  Result Value Ref Range   Glucose-Capillary 85 65 - 99 mg/dL      HEENT: Normocephalic, atraumatic Cardio: RRR and no murmur Resp: CTA B/L and unlabored GI: BS positive and LLQ colostomy, midline open abd wound with wound vac, distended and tender to palpation Skin:   Wound see above, no heel ulceration. VAC suctioning without leaks Neuro: Alert/Oriented   Musc/Skel:  No tenderness of extremities No Edema Antigravity strength throughout, except for b/l hip flexors Gen NAD. Vital signs reviewed   Assessment/Plan: 1. Functional deficits secondary to debilitation/Crohn's disease/perforated left colon with peritonitis/exploratory laparotomy with drainage of intra-abdominal abscess partial colon resection with colostomy which require 3+ hours per day of interdisciplinary therapy in a comprehensive inpatient rehab setting. Physiatrist is providing close team supervision and 24 hour management of active medical problems listed below. Physiatrist and rehab team continue to assess barriers to discharge/monitor patient progress toward functional and medical goals.  Patient has not been participating in therapy due to severe nausea and weakness. By mouth intake declined from fair to essentially nothing. Patient has symptoms related to obstruction. Not Able to tolerate intensive rehabilitation at the current time. Recommend transfer to acute care. May need surgical drainage of the abdominal abscesses if percutaneous drainage not helpful. Also will likely need TPN and short-term and perhaps parenteral to feeding over longer term.   FIM: Function - Bathing Position: Bed Body parts bathed by patient: Right arm, Left arm, Chest Body parts bathed by helper: Abdomen, Front perineal area, Buttocks, Right upper leg, Right lower leg, Left upper leg, Left lower leg Bathing not applicable: Abdomen, Buttocks,  Front perineal area (peri care done prior to OT session) Assist Level: Touching or steadying assistance(Pt > 75%)  Function- Upper Body Dressing/Undressing What is  the patient wearing?: Hospital gown Bra - Perfomed by patient: Thread/unthread right bra strap, Thread/unthread left bra strap Bra - Perfomed by helper: Hook/unhook bra (pull down sports bra) Pull over shirt/dress - Perfomed by patient: Thread/unthread right sleeve, Thread/unthread left sleeve Pull over shirt/dress - Perfomed by helper: Pull shirt over trunk Assist Level: Touching or steadying assistance(Pt > 75%) Set up : To obtain clothing/put away Function - Lower Body Dressing/Undressing What is the patient wearing?: Non-skid slipper socks Position: Bed Pants- Performed by patient: Pull pants up/down Pants- Performed by helper: Thread/unthread right pants leg, Thread/unthread left pants leg, Pull pants up/down Non-skid slipper socks- Performed by patient: Don/doff right sock, Don/doff left sock Non-skid slipper socks- Performed by helper: Don/doff right sock, Don/doff left sock Assist for lower body dressing: Touching or steadying assistance (Pt > 75%)  Function - Toileting Toileting activity did not occur: Safety/medical concerns Toileting steps completed by patient: Adjust clothing prior to toileting, Performs perineal hygiene, Adjust clothing after toileting Toileting steps completed by helper: Adjust clothing prior to toileting, Performs perineal hygiene, Adjust clothing after toileting Assist level: Touching or steadying assistance (Pt.75%)  Function - Air cabin crew transfer activity did not occur: Safety/medical concerns Toilet transfer assistive device: Mechanical lift Mechanical lift: Maximove Assist level to toilet: 2 helpers Assist level from toilet: 2 helpers  Function - Chair/bed transfer Chair/bed transfer activity did not occur: Safety/medical concerns Chair/bed transfer method: Other Chair/bed transfer assist level: 2 helpers Chair/bed transfer assistive device: Mechanical lift Mechanical lift: Maximove Chair/bed transfer details: Verbal cues for safe  use of DME/AE, Manual facilitation for placement, Verbal cues for precautions/safety  Function - Locomotion: Wheelchair Will patient use wheelchair at discharge?: Yes Type: Manual Wheelchair activity did not occur: Safety/medical concerns (debility and awaiting appropriate equipment) Max wheelchair distance: 70' Assist Level: Touching or steadying assistance (Pt > 75%) Assist Level: Moderate assistance (Pt 50 - 74%) Assist Level: Dependent (Pt equals 0%) Turns around,maneuvers to table,bed, and toilet,negotiates 3% grade,maneuvers on rugs and over doorsills: No Function - Locomotion: Ambulation Ambulation activity did not occur: Safety/medical concerns (significant debility)  Function - Comprehension Comprehension: Auditory Comprehension assist level: Understands complex 90% of the time/cues 10% of the time  Function - Expression Expression: Verbal Expression assist level: Expresses complex 90% of the time/cues < 10% of the time  Function - Social Interaction Social Interaction assist level: Interacts appropriately with others with medication or extra time (anti-anxiety, antidepressant).  Function - Problem Solving Problem solving assist level: Solves basic 90% of the time/requires cueing < 10% of the time  Function - Memory Memory assist level: More than reasonable amount of time Patient normally able to recall (first 3 days only): Current season, Location of own room, Staff names and faces, That he or she is in a hospital   Medical Problem List and Plan: 1. Functional deficits secondary to debilitation/Crohn's disease/perforated left colon with peritonitis/exploratory laparotomy with drainage of intra-abdominal abscess partial colon resection with colostomy. Restarted  Pentasa 500 mg 4 times a day on 10/15 was several days without it -nausea Likely from small bowel obstruction and abscess 2.  DVT Prophylaxis/Anticoagulation: Subcutaneous heparin. Monitor platelet counts,  currently no signs of bleeding 3. Pain Management: Hydrocodone as needed 4. Acute blood loss anemia. Monitor while on heparin, hgb 7.5 on 10/23.  Repeat hgb 10/24 8.0.7.6 on 10/28 No need  for transfusion at current time 5. Neuropsych: This patient is capable of making decisions on her own behalf. 6. Skin/Wound Care: dehiscence noted at muco cutaneous jct of stoma- CCS seeing pt on M-W-F and WOC RN following, s/p placement of wound vac,  7. Fluids/Electrolytes/Nutrition: Strict I&O with follow-up chemistries  -Hypokalemia will need K rider, potassium supplementation likely will need to be IV until by mouth route is more viable  - repeat CMET with pre alb to follow 8. Acute renal insufficiency ; improving, 1.04 on 10/23 .    9. Acute respiratory failure/VDRF. Extubated 06/14/2015.propable obesity hypoventilation syndrome, cont PRN O2 10. SVT. Monitor heart rate. Lopressor 50 mg twice a day, pt also severely deconditioned with poor exercise tolerance, am HR 94, cont current dose 11. Chronic systolic congestive heart failure. Monitor for any signs of fluid overload. Lasix 40 mg twice a day, monitor intake and creat, no SOB, sats ok, no change in dose at present 12.  UTI ecoli- s/p treatment for 5d afebrile,has foley 13. Morbid obesity. Body mass index 67.33 14.  Mild hypokalemia - will supplement.  Monitor for worsening with recent vomiting and reduced  intake15.  Severe hypoalbuminemia- supplement , this is contributing to delayed wound healing , doesn't tolerate beneprotein but drinks ensure .   15.  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   16. Probable depression may be contributing to poor intake, has flat affect. Neuropsych to continue to see the patient. Start methylphenidate 5 mg twice a day as well as Lexapro 5 mg in the morning, Not sure if Patient needs to be nothing by mouth for meds  will defer to gastroenterology on that LOS (Days) [place] E 07/21/2015, 8:28 AM

## 2015-07-21 NOTE — Progress Notes (Signed)
[name] 2:33 PM  Subjective: Patient doing fine post aspiration and drain but has not yet tried to eat and no new complaints  Objective: Vital signs stable afebrile no acute distress exam unchanged awaiting culture on fluid  Assessment: Status post Crohn's surgery now with abscess status post drainage and inability to eat hopefully secondary to abscess pushing on small bowel  Plan: If patient is not able to start eating soon she will need TPN and would probably benefit from a few weeks of that regardless and certainly if draining abscess does not help her eating please call us back otherwise will need significant rehabilitation when medically stable  [facility]  Pager [phone] After 5PM or if no answer call [phone]

## 2015-07-21 NOTE — Care Management Note (Signed)
Case Management Note  Patient Details  Name: Dominique [name] MRN: [id number] Date of Birth: 09-05-1981  Subjective/Objective:      Date:07/21/15 Spoke with patient at the bedside. Introduced self as Tourist information centre manager and explained role in discharge planning and how to be reached. Verified patient lives in town, with mom, has ostomy , wound vac ([employer]). Expressed potential need for no other DME.  Verified patient anticipates to go CIR , if they do not take her back will need work up for snf at time of discharge and will have  part-time supervision by family at this time to best of their knowledge. Patient denied needing help with their medication. Patient is driven by her sister to MD appointments.  Verified patient has PCP [facility]ie[facility].   Plan: CM will continue to follow for discharge planning and [facility] resources.               Action/Plan:   Expected Discharge Date:                  Expected Discharge Plan:  IP Rehab Facility  In-House Referral:  Clinical Social Work  Discharge planning Services  CM Consult  Post Acute Care Choice:    Choice offered to:     DME Arranged:    DME Agency:     HH Arranged:    [place] Agency:     Status of Service:  In process, will continue to follow  Medicare Important Message Given:    Date Medicare IM Given:    Medicare IM give by:    Date Additional Medicare IM Given:    Additional Medicare Important Message give by:     If discussed at [place] of Stay Meetings, dates discussed:    Additional Comments:  Zenon Mayo, RN 07/21/2015, 4:00 PM

## 2015-07-21 NOTE — Plan of Care (Signed)
Problem: RH PAIN MANAGEMENT Goal: RH STG PAIN MANAGED AT OR BELOW PT'S PAIN GOAL 4 or less  Outcome: Not Met (add Reason) Patient transferred to 5 W after a procedure

## 2015-07-21 NOTE — Plan of Care (Signed)
Problem: RH SKIN INTEGRITY Goal: RH STG ABLE TO PERFORM INCISION/WOUND CARE W/ASSISTANCE STG Able To Perform Incision/Wound Care With total Assistance from caregiver.  Outcome: Not Met (add Reason) Patient transferred to acute after procedure

## 2015-07-21 NOTE — Progress Notes (Signed)
Social Work  Discharge Note  The overall goal for the admission was met for:   Discharge location: No-TRANSFERRING TO ACUTE FOR SURGERY  Length of Stay: No-25 DAYS  Discharge activity level: No-MOD/MAX LEVEL OF ASSIST-DID NOT REACH HER GOALS  AND WOULD NEED TO BE SUPERVISION LEVEL TO RETURN HOME WITH MOM PT DID PARTICIPATE IN THERAPIES WHILE HERE. PLAN AT HER CURRENT LEVEL WAS NHP  Home/community participation: No  Services provided included: MD, RD, PT, OT, RN, CM, TR, Pharmacy, Neuropsych and SW  Financial Services: Medicaid  Follow-up services arranged: Other: ACUTE FOR SURGERY  Comments (or additional information):  Patient/Family verbalized understanding of follow-up arrangements: No  Individual responsible for coordination of the follow-up plan: PATIENT  Confirmed correct DME delivered: ,  G 07/21/2015    ,  G 

## 2015-07-22 ENCOUNTER — Encounter (HOSPITAL_COMMUNITY): Payer: Self-pay

## 2015-07-22 DIAGNOSIS — F4323 Adjustment disorder with mixed anxiety and depressed mood: Secondary | ICD-10-CM

## 2015-07-22 LAB — COMPREHENSIVE METABOLIC PANEL
ALK PHOS: 249 U/L — AB (ref 38–126)
ALT: 29 U/L (ref 14–54)
ANION GAP: 7 (ref 5–15)
AST: 21 U/L (ref 15–41)
Albumin: 1.2 g/dL — ABNORMAL LOW (ref 3.5–5.0)
BILIRUBIN TOTAL: 1.4 mg/dL — AB (ref 0.3–1.2)
BUN: 12 mg/dL (ref 6–20)
CALCIUM: 7 mg/dL — AB (ref 8.9–10.3)
CO2: 32 mmol/L (ref 22–32)
Chloride: 93 mmol/L — ABNORMAL LOW (ref 101–111)
Creatinine, Ser: 1.02 mg/dL — ABNORMAL HIGH (ref 0.44–1.00)
GFR calc non Af Amer: >60 mL/min (ref >60)
Glucose, Bld: 90 mg/dL (ref 65–99)
Potassium: 3 mmol/L — ABNORMAL LOW (ref 3.5–5.1)
Sodium: 132 mmol/L — ABNORMAL LOW (ref 135–145)
TOTAL PROTEIN: 5.1 g/dL — AB (ref 6.5–8.1)

## 2015-07-22 LAB — URINALYSIS, ROUTINE W REFLEX MICROSCOPIC
BILIRUBIN URINE: NEGATIVE
Glucose, UA: NEGATIVE mg/dL
KETONES UR: NEGATIVE mg/dL
Nitrite: POSITIVE — AB
PROTEIN: 30 mg/dL — AB
Specific Gravity, Urine: 1.022 (ref 1.005–1.030)
UROBILINOGEN UA: 0.2 mg/dL (ref 0.0–1.0)
pH: 5 (ref 5.0–8.0)

## 2015-07-22 LAB — CBC
HEMATOCRIT: 25.4 % — AB (ref 36.0–46.0)
HEMOGLOBIN: 8 g/dL — AB (ref 12.0–15.0)
MCH: 28.8 pg (ref 26.0–34.0)
MCHC: 31.5 g/dL (ref 30.0–36.0)
MCV: 91.4 fL (ref 78.0–100.0)
PLATELETS: 393 10*3/uL (ref 150–400)
RBC: 2.78 MIL/uL — AB (ref 3.87–5.11)
RDW: 19 % — AB (ref 11.5–15.5)
WBC: 10 10*3/uL (ref 4.0–10.5)

## 2015-07-22 LAB — URINE MICROSCOPIC-ADD ON

## 2015-07-22 MED ORDER — FOLIC ACID 1 MG PO TABS
1.0000 mg | ORAL_TABLET | Freq: Every day | ORAL | Status: DC
  Administered 2015-07-22 – 2015-07-25 (×4): 1 mg via ORAL
  Filled 2015-07-22 (×4): qty 1

## 2015-07-22 MED ORDER — SODIUM CHLORIDE 0.9 % IV SOLN
INTRAVENOUS | Status: AC
  Administered 2015-07-22: 14:00:00 via INTRAVENOUS
  Filled 2015-07-22 (×4): qty 1000

## 2015-07-22 MED ORDER — SODIUM CHLORIDE 0.9 % IV SOLN: INTRAVENOUS | Status: DC

## 2015-07-22 MED ORDER — MAGNESIUM SULFATE 50 % IJ SOLN
3.0000 g | Freq: Once | INTRAVENOUS | Status: AC
  Administered 2015-07-22: 3 g via INTRAVENOUS
  Filled 2015-07-22: qty 6

## 2015-07-22 NOTE — Evaluation (Signed)
Physical Therapy Evaluation Patient Details Name: Dominique [name] MRN: [id number] DOB: [birth year] Today's Date: 07/22/2015   History of Present Illness  Adm 9/10 for colitis 9/16 detected perforated descending colon >>laparotomy, drain of abscess, partial colectomy, wound vac; remained on vent post op 9/18 To OR >> wound closed, colostomy created 9/21 Extubated, went to CIR 9/26-10/28.  She returned to acute level of care for further medical management.  Clinical Impression  Patient's mobility today limited by need for lift equipment.  Performed supine exercises for general strengthening and bed repositioning.  Patient reports trying to sit erect x 1 hr today in bed.  Therapy will continue to follow to assist with discharge planning and follow up recommendations. Will continue to follow patient while on this venue of care to progress mobility.    Follow Up Recommendations CIR    Equipment Recommendations  None recommended by PT (to be determined with progress)    Recommendations for Other Services OT consult     Precautions / Restrictions Precautions Precautions: Fall Precaution Comments: abdominal wound vac, colostomy Restrictions Weight Bearing Restrictions: No Other Position/Activity Restrictions: orthostatic BP with upright sitting per CIR notes who was using tilt table      Mobility  Bed Mobility Overal bed mobility: +2 for physical assistance Bed Mobility:  (scooting to [facility])           General bed mobility comments: trendelenberg positiong, use of bil rail and assist to stabilize feet to scoot to [facility] with multiple efforts  Transfers   Equipment used: None                Ambulation/Gait                Stairs            Wheelchair Mobility    Modified Rankin (Stroke Patients Only)       Balance                                             Pertinent Vitals/Pain Pain Assessment: 0-10 Pain Score: 4  Pain Location: left  side of abdomen Pain Descriptors / Indicators: Sore;Aching Pain Intervention(s): Limited activity within patient's tolerance;Monitored during session    Home Living Family/patient expects to be discharged to:: Private residence Living Arrangements: Parent;Other relatives;Other (Comment) (91 yo dau) Available Help at Discharge: Family;Available 24 hours/day Type of Home: Apartment Home Access: Stairs to enter Entrance Stairs-Rails: Right;Left;Can reach both Entrance Stairs-Number of Steps: 8 Home Layout: One level Home Equipment: None Additional Comments: mother lives OOT, can stay to assist pt upon d/c    Prior Function Level of Independence: Independent               Hand Dominance   Dominant Hand: Right    Extremity/Trunk Assessment   Upper Extremity Assessment: Overall WFL for tasks assessed           Lower Extremity Assessment: Generalized weakness RLE Deficits / Details: grossly 2+/5 hip LLE Deficits / Details: grossly 2+/5 hip  Cervical / Trunk Assessment:  (unable to assess as pt laying supine)  Communication   Communication: No difficulties  Cognition Arousal/Alertness: Awake/alert Behavior During Therapy: WFL for tasks assessed/performed Overall Cognitive Status: Within Functional Limits for tasks assessed                      General  Comments General comments (skin integrity, edema, etc.): morbid obesity lends to incr difficulty with mobility    Exercises Total Joint Exercises Ankle Circles/Pumps: AROM;Both;10 reps;Supine Gluteal Sets: AROM;Both;10 reps;Supine Short Arc Quad: AROM;Both;10 reps;Supine Heel Slides: AAROM;Both;Supine;Other (comment);20 reps (10 reps with PT, 10 reps with AAROM using sheet) Hip ABduction/ADduction: AAROM;Both;20 reps;Supine (10 reps with legs straight, 10 reps in hooklying min assist) Straight Leg Raises: AAROM;10 reps;Both;Supine (with therapist assist and pt using sheet assist) Bridges: AAROM;10 reps;Supine  (with rail and tendelenberg bed position)      Assessment/Plan    PT Assessment Patient needs continued PT services  PT Diagnosis Generalized weakness;Acute pain   PT Problem List Decreased strength;Decreased activity tolerance;Decreased mobility;Obesity;Pain  PT Treatment Interventions DME instruction;Gait training;Functional mobility training;Therapeutic activities;Therapeutic exercise;Balance training;Neuromuscular re-education;Patient/family education   PT Goals (Current goals can be found in the Care Plan section) Acute Rehab PT Goals Patient Stated Goal: get stronger PT Goal Formulation: With patient Time For Goal Achievement: 08/12/15 Potential to Achieve Goals: Good    Frequency Min 3X/week   Barriers to discharge Inaccessible home environment      Co-evaluation               End of Session   Activity Tolerance: Patient tolerated treatment well;No increased pain Patient left: in bed;with call bell/phone within reach Nurse Communication: Mobility status;Need for lift equipment         Time: 1535-1630 PT Time Calculation (min) (ACUTE ONLY): 55 min   Charges:   PT Evaluation $Initial PT Evaluation Tier I: 1 Procedure PT Treatments $Therapeutic Exercise: 23-37 mins   PT G CodesMalka [name], PT [phone]  Dominique [name] 07/22/2015, 4:52 PM

## 2015-07-22 NOTE — Progress Notes (Signed)
CCMD called.  Pt had 9 beats run of V-tach. Vitals are stable. Pt is asymptomatic. On-call provider Lamar Blinks, NP notified via text page. Will cont to monitor.

## 2015-07-22 NOTE — Plan of Care (Signed)
Problem: Phase III Progression Outcomes Goal: Foley discontinued Outcome: Not Met (add Reason) Pt still has foley in place

## 2015-07-22 NOTE — Progress Notes (Signed)
Referring Physician(s): Magod  Chief Complaint:  S/P CT guided drainage of abscess by Dr. Kathlene Cote 07/21/2015  Subjective:  Dominique [name] is doing well today. She states she is feeling better. No complaints. No soreness at drain site.  Allergies: Other and Penicillins  Medications: Prior to Admission medications   Medication Sig Start Date End Date Taking? Authorizing Provider  ferrous sulfate 325 (65 FE) MG tablet Take 2 tablets (650 mg total) by mouth daily with breakfast. 04/07/15  Yes Midge Minium, MD  furosemide (LASIX) 40 MG tablet Take 1 tablet (40 mg total) by mouth 2 (two) times daily. 06/26/15  Yes Verlee Monte, MD  mesalamine (PENTASA) 250 MG CR capsule Take 2 capsules (500 mg total) by mouth 4 (four) times daily. 06/26/15  Yes Verlee Monte, MD  metoprolol succinate (TOPROL XL) 50 MG 24 hr tablet Take 1 tablet (50 mg total) by mouth daily. Take with or immediately following a meal. 06/26/15  Yes Verlee Monte, MD  ondansetron (ZOFRAN) 4 MG tablet Take 1 tablet (4 mg total) by mouth every 6 (six) hours. 05/25/15  Yes Heather Laisure, PA-C  polyethylene glycol (MIRALAX / GLYCOLAX) packet Take 17 g by mouth daily. 06/26/15  Yes Verlee Monte, MD  PROAIR HFA 108 (90 BASE) MCG/ACT inhaler INHALE 2 PUFFS BY MOUTH INTO THE LUNGS EVERY 6 HOURS AS NEEDED FOR WHEEZING OR SHORTNESS OF BREATH 02/17/15  Yes Midge Minium, MD  saccharomyces boulardii (FLORASTOR) 250 MG capsule Take 1 capsule (250 mg total) by mouth 2 (two) times daily. 06/26/15  Yes Verlee Monte, MD  albuterol (PROVENTIL) (2.5 MG/3ML) 0.083% nebulizer solution Take 3 mLs (2.5 mg total) by nebulization every 6 (six) hours as needed for wheezing or shortness of breath. Patient not taking: Reported on 06/03/2015 10/27/14   Rigoberto Noel, MD     Vital Signs: BP 119/58 mmHg  Pulse 93  Temp(Src) 97.3 F (36.3 C) (Oral)  Resp 18  Ht 5' 2"  (1.575 m)  Wt 364 lb (165.109 kg)  BMI 66.56 kg/m2  SpO2 100%  LMP 05/25/2015 (LMP  Unknown)  Physical Exam   Awake and alert NAD Abdomen soft and NT Drain in place left abdomen Wound vac also in place. Flushes easily. ~240 mls output. Brownish liquid.  Imaging: Ct Abdomen Pelvis W Contrast  07/20/2015  CLINICAL DATA:  34 year old. Vomiting. Recent colon surgery. Patient unable to tolerate p.o. contrast. EXAM: CT ABDOMEN AND PELVIS WITH CONTRAST TECHNIQUE: Multidetector CT imaging of the abdomen and pelvis was performed using the standard protocol following bolus administration of intravenous contrast. CONTRAST:  118m OMNIPAQUE IOHEXOL 300 MG/ML  SOLN COMPARISON:  06/23/2015 FINDINGS: Lower chest: No pleural effusion identified. There is atelectasis noted in the left base. Hepatobiliary: Diffuse hepatic steatosis identified. No focal liver abnormality. The gallbladder is normal. No biliary dilatation. Pancreas: Negative. Spleen: Negative. Adrenals/Urinary Tract: The adrenal glands are normal. The kidneys are unremarkable. Urinary bladder is collapsed around a Foley catheter balloon. Stomach/Bowel: Normal appearance of the stomach. The proximal small bowel loops have a normal caliber. No bowel wall thickening or dilatation. There is diffuse wall thickening involving the colon. 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 Vascular/Lymphatic: Normal appearance of the abdominal aorta. No enlarged retroperitoneal or mesenteric adenopathy. No enlarged pelvic or inguinal lymph nodes. Reproductive: The uterus and the adnexal structures are unremarkable. Other: Within the left hemi abdomen there is a well-circumscribed fluid collection measuring 4.6 x 7.7 x 12.0 cm. This appears  to exert mass effect upon the adjacent left upper quadrant small bowel loops. There is surrounding fating. stranding. Several smaller fluid collections are identified within the left lower quadrant and central pelvis. The largest of these is along the fundus of the uterus measuring  5.5 x 1.6 x 2.3 cm. Diffuse body wall edema is identified compatible with anasarca. Open ventral abdominal wall wound is identified with drain in place. Musculoskeletal: Unremarkable. IMPRESSION: 1. Examination is remarkable for several abscesses within the abdomen and pelvis. The largest is in the left hemi abdomen and has mass effect upon the small bowel loops. This has a maximum dimension of 12 cm. 2. 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 The remaining colon is diffusely edematous. 3. Hepatic steatosis. This appears progressive when compared with previous imaging. 4. No evidence to suggest small bowel obstruction. Electronically Signed   By: Kerby Moors M.D.   On: 07/20/2015 15:55   Ct Image Guided Drainage By Percutaneous Catheter  07/21/2015  CLINICAL DATA:  Colonic perforation and status post colectomy. Development of left-sided peritoneal abscess requiring percutaneous drainage. EXAM: CT GUIDED DRAINAGE OF PERITONEAL ABSCESS ANESTHESIA/SEDATION: 150 mcg IV Fentanyl Total Moderate Sedation Time:  20 minutes PROCEDURE: The procedure, risks, benefits, and alternatives were explained to the patient. Questions regarding the procedure were encouraged and answered. The patient understands and consents to the procedure. A time-out was performed prior to the procedure. The left abdominal wall was prepped with Betadine in a sterile fashion, and a sterile drape was applied covering the operative field. A sterile gown and sterile gloves were used for the procedure. Local anesthesia was provided with 1% Lidocaine. CT was performed in a supine position. Under CT guidance, an 18 gauge trocar needle was advanced into an abscess within the left lower peritoneal cavity. Aspiration of fluid was performed and a sample sent for culture analysis. A guidewire was advanced and the tract dilated. A 12 French percutaneous drainage catheter was placed. Catheter position was confirmed by CT. The  catheter was flushed and connected to a suction bulb. It was secured at the skin with a Prolene retention suture and StatLock device. COMPLICATIONS: None FINDINGS: Aspiration at the level of the left lower quadrant abscess revealed purulent fluid. After placement of the drainage catheter, there is excellent return of purulent fluid. IMPRESSION: CT-guided percutaneous catheter drainage of left lower quadrant peritoneal abscess. A sample of purulent fluid was sent for culture analysis. Output will be followed. Electronically Signed   By: Aletta Edouard M.D.   On: 07/21/2015 13:21    Labs:  CBC:  Recent Labs  07/17/15 0646 07/20/15 0800 07/21/15 0440 07/22/15 0515  WBC 11.7* 10.4 10.5 10.0  HGB 8.0* 7.7* 7.6* 8.0*  HCT 27.2* 25.2* 25.0* 25.4*  PLT 464* 413* 450* 393    COAGS:  Recent Labs  11/17/14 0545 06/11/15 2345 07/21/15 0440  INR 1.46 1.60* 1.57*  APTT  --   --  40*    BMP:  Recent Labs  07/20/15 0800 07/21/15 0440 07/21/15 2011 07/22/15 0515  NA 138 135 138 132*  K 3.1* 2.8* 3.4* 3.0*  CL 97* 95* 98* 93*  CO2 34* 32 32 32  GLUCOSE 95 82 86 90  BUN 15 15 12 12   CALCIUM 7.3* 7.0* 7.2* 7.0*  CREATININE 1.17* 1.17* 1.07* 1.02*  GFRNONAA 60* 60* >60 >60  GFRAA >60 >60 >60 >60    LIVER FUNCTION TESTS:  Recent Labs  06/20/15 0451  06/26/15 0540 06/27/15 0543  07/20/15 0800 07/22/15 0515  BILITOT 0.8  --   --  0.6 0.8 1.4*  AST 17  --   --  28 37 21  ALT 16  --   --  20 28 29   ALKPHOS 81  --   --  102 263* 249*  PROT 5.6*  --   --  4.3* 5.0* 5.1*  ALBUMIN 1.7*  < > 1.5* 1.4* 1.1* 1.2*  < > = values in this interval not displayed.  Assessment and Plan:  S/P CT guided drain placement by Dr. Kathlene Cote 07/21/2015 Continue flushes. Contiue Abx.  Signed: Murrell Redden PA-C 07/22/2015, 1:28 PM   I spent a total of 15 Minutes at the the patient's bedside AND on the patient's hospital floor or unit, greater than 50% of which was counseling/coordinating  care for f/u after drain placment

## 2015-07-22 NOTE — Progress Notes (Signed)
[facility] PROGRESS NOTE  Dominique [name] DPO:[id number] DOB: July 23, 1981 DOA: 07/21/2015 PCP: Benito Mccreedy, MD  Length of stay: 1   Assessment/Plan: Principal Problem:   Postoperative intra-abdominal abscess ([place]) Active Problems:   Morbid obesity- BMI 67.5   OSA (obstructive sleep apnea)   Hepatic steatosis   Sinus tachycardia (HCC)   Iron deficiency anemia   Protein-calorie malnutrition, severe (HCC)   Crohn's disease (HCC)   Perforated sigmoid colon (HCC)   OSA on CPAP   Obesity hypoventilation syndrome (HCC)   Pulmonary hypertension (HCC)   Severe left ventricular systolic dysfunction   Physical deconditioning   Adjustment disorder with mixed anxiety and depressed mood   Hypokalemia    Postoperative intra-abdominal abscess 2/2 perforated sigmoid colon in setting of new Crohn's disease -Continue wound VAC -Continue routine ostomy care,Underwent percutaneous drainage of left lower quadrant abscess by IR yesterday. Purulent fluid returned. The fluid is much thinner now. Await Gram stain and culture. - Started on empiric Cipro and Flagyl IV Followed by general surgery, await culture, continue antibiotics for now  :  Severe left ventricular systolic dysfunction -New finding this admission in setting of septic shock and critical illness with EF of 20% -Has been 4 weeks since last echocardiogram so we'll obtain repeat echocardiogram to see if ejection fraction has improved/recovered -Compensated at present without signs of heart failure on exam Restart IV fluids with potassium in the setting of intra-abdominal process Watch for volume overload   Hypokalemia/hypomagnesemia Replete  Hyponatremia-discontinue half normal saline, switched to normal saline   OSA/OHA/pulmonary hypertension -Patient continues to refuse utilization of CPAP at hour of sleep   Recent E. Coli UTI -Just completed a course of Keflex on 10/25 (Fluoroquinolone resistant) -Repeat  urinalysis and culture -Hopeful can remove catheter soon    Sinus tachycardia ([place]) -Likely related to underlying deconditioning as well as ongoing pain and anxiety -Continue preadmission beta blocker   Adjustment disorder with mixed anxiety and depressed mood -Has been started on Lexapro and Ritalin this admission by psychology -Continue to follow -Certainly contributing to patient's overall well-being and ability to participate in rehabilitative therapies   Morbid obesity- BMI 67.5/protein calorie malnutrition severe -Weight dating back to April 2016 410 pounds with current weight 332 pounds -Most recent preop human 6.7 with an albumin of 1.1 on 10/27 -Continue nutrition evaluation and treatment -Resume protein supplementation once diet resumed -Monitor for improvement in GI symptoms post drainage of intra-abdominal abscess; if does not improve may require TNA -Also mildly coagulopathic and likely related to poor nutrition as well   Hepatic steatosis -Most recent LFTs normal except for elevated alkaline phosphatase of 263 -Follow closely if required to initiate parenteral nutrition   Iron deficiency anemia -Did require transfusion postoperatively Hemoglobin stable around 4.3-1.5 -Certainly influenced by poor nutrition Iron panel consistent with anemia of chronic disease, folate is low therefore start folate supplementation   DVT prophylaxsis   Code Status:      Code Status Orders        Start     Ordered   07/21/15 0928  Full code   Continuous     07/21/15 0935     Family Communication: family updated about patient's clinical progress Disposition Plan:  As above    Brief narrative: 34 year old female patient who was recently admitted to the acute care portion of the hospital on 9/11 secondary to severe abdominal pain related to perforated sigmoid colon in the setting of new diagnosis of Crohn's colitis.  According to the EMR, the patient had been having  recurrent abdominal symptoms felt to be either diverticulitis or colitis since January 2016 which required admission in February 2016. Her symptoms were quiescent until May/June 2016 when she redeveloped anorexia with associated nausea vomiting and diarrhea. She had been unable to see a gastroenterologist in the outpatient setting because of lack of insurance and referral issues but just prior to the September admission she was finally able to see a gastroenterologist. During that evaluation her most recent CT scan had been reviewed and the plan was to continue antibiotics and to return for follow-up. Unfortunately in the interim she had developed severe abdominal pain and presented to the emergency department for evaluation. Initial workup revealed perforated left colon with peritonitis and peritoneal abscess requiring emergent expiratory laparotomy on 9/16 with partial colon resection and drainage of intra-abdominal abscess. She returned to the OR on 9/18 for reexploration of the abdomen with closure and mobilization of the splenic flexure and creation of colostomy. In the immediate postop period patient had septic shock and required short-term ventilation but was eventually stabilized and transitioned out to step down and then to the general medical floor. During the course of the hospitalization an echocardiogram was obtained that demonstrated an ejection fraction of 20% with diffuse hypokinesis which was new for this patient compared to previous echocardiogram in 2011. Etiology was felt to be secondary to stress critical illness and recent septic shock. Cardiology evaluated the patient and anticipated improvement in ejection fraction over time. She also has underlying persistent sinus tachycardia and was on beta blockers prior to admission. Cardiology also added twice a day Lasix during this hospitalization. Patient was also transfused packed red blood cells due to anemia of critical illness and hemoglobin has  remained stable around 7.7-8.0. She also had issues with acute renal failure secondary to septic shock and ATN with peak creatinine of 2.8 with creatinine at time of discharge to rehabilitation 1.0. Patient reported issues with recurrent chronic anorexia with associated nausea vomiting diarrhea and abdominal pain dating back to February 2016 and during this hospitalization patient's laboratory data supported severe malnutrition with an acute on chronic component. She was subsequent discharged to the rehabilitation unit on 06/26/15.  Unfortunately since arrival to the rehabilitation unit patient has not thrived. She has continued to require a Foley catheter. She has struggled with failure to thrive and profound depression prompting nutritional as well as psychological consultation with the addition of appropriate antidepressant medications. She has also had issues of recurrent nausea and vomiting and inability to tolerate solid diet as well as liquids. This prompted gastroenterology consultation on 10/24. She eventually underwent EGD on 10/26 which revealed a small hiatal hernia as well as mildly edematous folds throughout. She was also found to have either tortuous duodenum jejunum junction at ligament of Treitz or increased edema and the gastroenterologist was unable to advance the scope passed that turn. This prompted a contrasted CT of the abdomen and pelvis which revealed an abscess (4.6 x 7.7 x 12 cm) obstructing her ligament of Treitz which goes along with her endoscopic findings. In addition there were several smaller fluid collections identified within the left lower quadrant and central pelvis with the largest measuring 5.5 X 1.6 X 2.3 cm. Gastroenterology discussed with interventional radiology who plans to evaluate the patient for possible percutaneous drainage. Surgery has also evaluated the patient today and recommends holding off on antibiotics until after fluid collection could be drained and  culture results have returned  since patient is afebrile, hemodynamically stable and has no leukocytosis. Because of these above issues rehabilitative medicine has requested the patient transfer back to the acute care setting and we will reassume care of this patient as attending physicians.   Consultants:  General surgery  Procedures: CT GUIDED DRAINAGE OF PERITONEAL ABSCESS 10/28  Antibiotics: Anti-infectives    Start     Dose/Rate Route Frequency Ordered Stop   07/21/15 1245  ciprofloxacin (CIPRO) IVPB 400 mg     400 mg 200 mL/hr over 60 Minutes Intravenous Every 12 hours 07/21/15 1233     07/21/15 1245  metroNIDAZOLE (FLAGYL) IVPB 500 mg     500 mg 100 mL/hr over 60 Minutes Intravenous Every 8 hours 07/21/15 1233           HPI/Subjective: Tolerating diet but not very hungry. No new ostomy problems. Ostomy functioning  Objective: Filed Vitals:   07/21/15 1447 07/21/15 2124 07/22/15 0457 07/22/15 0500  BP:  102/66 119/58   Pulse:  99 93   Temp:  98.2 F (36.8 C) 97.3 F (36.3 C)   TempSrc:  Oral Oral   Resp:  18 18   Height: 5' 2"  (1.575 m)     Weight: 150.594 kg (332 lb)   165.109 kg (364 lb)  SpO2:  92% 100%     Intake/Output Summary (Last 24 hours) at 07/22/15 1101 Last data filed at 07/22/15 0457  Gross per 24 hour  Intake    800 ml  Output   1360 ml  Net   -560 ml    Exam:  General: No acute respiratory distress Lungs: Clear to auscultation bilaterally without wheezes or crackles Cardiovascular: Regular rate and rhythm without murmur gallop or rub normal S1 and S2 Abdomen: Nontender, nondistended, soft, bowel sounds positive, no rebound, no ascites, no appreciable mass Extremities: No significant cyanosis, clubbing, or edema bilateral lower extremities     Data Review   Micro Results Recent Results (from the past 240 hour(s))  MRSA PCR Screening     Status: None   Collection Time: 07/21/15 10:06 AM  Result Value Ref Range Status   MRSA by  PCR NEGATIVE NEGATIVE Final    Comment:        The GeneXpert MRSA Assay (FDA approved for NASAL specimens only), is one component of a comprehensive MRSA colonization surveillance program. It is not intended to diagnose MRSA infection nor to guide or monitor treatment for MRSA infections.     Radiology Reports Ct Abdomen Pelvis Wo Contrast  06/23/2015  CLINICAL DATA:  Wound infection. History of perforated left colon with peritonitis and peritoneal abscess. Crohn's disease . Laparotomy 06/11/2015 and 06/09/2015 EXAM: CT ABDOMEN AND PELVIS WITHOUT CONTRAST TECHNIQUE: Multidetector CT imaging of the abdomen and pelvis was performed following the standard protocol without IV contrast. COMPARISON:  CT 06/04/2015 FINDINGS: Lower chest: New mild bibasilar atelectasis without pleural effusion. Hepatobiliary: Limited evaluation of the liver due to her large patient size and lack of intravenous contrast. No liver lesion. Gallbladder bile ducts intact. Pancreas: Mild pancreatic enlargement compared to the prior study with mild edema below the uncinate process. Possible pancreatitis. Spleen: Negative Adrenals/Urinary Tract: Negative for renal obstruction or stone. Stomach/Bowel: Negative for bowel obstruction. No significant ileus. Left colostomy. Question mild edema in the right and transverse colon, not present previously. This could be due to colitis or possibly lack of adequate distention. No intra-abdominal abscess. Vascular/Lymphatic: Negative Reproductive: Normal uterus.  No adnexal mass. Other: Small amount of free fluid  in the left pelvis.  No abscess. Open wound in the midline down to the fascia. No abscess in the soft tissues. Musculoskeletal: Negative IMPRESSION: Postop left colectomy with left colostomy. Mild thickening of the right and transverse colon may reflect colitis. These areas did not show thickening on the prior study and therefore not likely to represent Crohn's colitis. Small amount of  free fluid in the left pelvis.  Negative for abscess Open wound in the anterior abdominal wall. Question mild pancreatitis with mild pancreatic edema. Limited evaluation due to lack of IV contrast large patient size. Mild atelectasis in the lung bases. Electronically Signed   By: Franchot Gallo M.D.   On: 06/23/2015 19:53   Ct Abdomen Pelvis W Contrast  07/20/2015  CLINICAL DATA:  34 year old. Vomiting. Recent colon surgery. Patient unable to tolerate p.o. contrast. EXAM: CT ABDOMEN AND PELVIS WITH CONTRAST TECHNIQUE: Multidetector CT imaging of the abdomen and pelvis was performed using the standard protocol following bolus administration of intravenous contrast. CONTRAST:  147m OMNIPAQUE IOHEXOL 300 MG/ML  SOLN COMPARISON:  06/23/2015 FINDINGS: Lower chest: No pleural effusion identified. There is atelectasis noted in the left base. Hepatobiliary: Diffuse hepatic steatosis identified. No focal liver abnormality. The gallbladder is normal. No biliary dilatation. Pancreas: Negative. Spleen: Negative. Adrenals/Urinary Tract: The adrenal glands are normal. The kidneys are unremarkable. Urinary bladder is collapsed around a Foley catheter balloon. Stomach/Bowel: Normal appearance of the stomach. The proximal small bowel loops have a normal caliber. No bowel wall thickening or dilatation. There is diffuse wall thickening involving the colon. 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 Vascular/Lymphatic: Normal appearance of the abdominal aorta. No enlarged retroperitoneal or mesenteric adenopathy. No enlarged pelvic or inguinal lymph nodes. Reproductive: The uterus and the adnexal structures are unremarkable. Other: Within the left hemi abdomen there is a well-circumscribed fluid collection measuring 4.6 x 7.7 x 12.0 cm. This appears to exert mass effect upon the adjacent left upper quadrant small bowel loops. There is surrounding fating. stranding. Several smaller fluid  collections are identified within the left lower quadrant and central pelvis. The largest of these is along the fundus of the uterus measuring 5.5 x 1.6 x 2.3 cm. Diffuse body wall edema is identified compatible with anasarca. Open ventral abdominal wall wound is identified with drain in place. Musculoskeletal: Unremarkable. IMPRESSION: 1. Examination is remarkable for several abscesses within the abdomen and pelvis. The largest is in the left hemi abdomen and has mass effect upon the small bowel loops. This has a maximum dimension of 12 cm. 2. 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 The remaining colon is diffusely edematous. 3. Hepatic steatosis. This appears progressive when compared with previous imaging. 4. No evidence to suggest small bowel obstruction. Electronically Signed   By: TKerby MoorsM.D.   On: 07/20/2015 15:55   Ct Image Guided Drainage By Percutaneous Catheter  07/21/2015  CLINICAL DATA:  Colonic perforation and status post colectomy. Development of left-sided peritoneal abscess requiring percutaneous drainage. EXAM: CT GUIDED DRAINAGE OF PERITONEAL ABSCESS ANESTHESIA/SEDATION: 150 mcg IV Fentanyl Total Moderate Sedation Time:  20 minutes PROCEDURE: The procedure, risks, benefits, and alternatives were explained to the patient. Questions regarding the procedure were encouraged and answered. The patient understands and consents to the procedure. A time-out was performed prior to the procedure. The left abdominal wall was prepped with Betadine in a sterile fashion, and a sterile drape was applied covering the operative field. A sterile gown and sterile  gloves were used for the procedure. Local anesthesia was provided with 1% Lidocaine. CT was performed in a supine position. Under CT guidance, an 18 gauge trocar needle was advanced into an abscess within the left lower peritoneal cavity. Aspiration of fluid was performed and a sample sent for culture analysis. A  guidewire was advanced and the tract dilated. A 12 French percutaneous drainage catheter was placed. Catheter position was confirmed by CT. The catheter was flushed and connected to a suction bulb. It was secured at the skin with a Prolene retention suture and StatLock device. COMPLICATIONS: None FINDINGS: Aspiration at the level of the left lower quadrant abscess revealed purulent fluid. After placement of the drainage catheter, there is excellent return of purulent fluid. IMPRESSION: CT-guided percutaneous catheter drainage of left lower quadrant peritoneal abscess. A sample of purulent fluid was sent for culture analysis. Output will be followed. Electronically Signed   By: Aletta Edouard M.D.   On: 07/21/2015 13:21     CBC  Recent Labs Lab 07/16/15 0450 07/17/15 0646 07/20/15 0800 07/21/15 0440 07/22/15 0515  WBC 11.1* 11.7* 10.4 10.5 10.0  HGB 7.5* 8.0* 7.7* 7.6* 8.0*  HCT 25.3* 27.2* 25.2* 25.0* 25.4*  PLT 462* 464* 413* 450* 393  MCV 88.5 90.4 90.6 90.6 91.4  MCH 26.2 26.6 27.7 27.5 28.8  MCHC 29.6* 29.4* 30.6 30.4 31.5  RDW 20.6* 20.0* 19.4* 19.4* 19.0*  LYMPHSABS  --  1.9  --   --   --   MONOABS  --  1.8*  --   --   --   EOSABS  --  0.1  --   --   --   BASOSABS  --  0.0  --   --   --     Chemistries   Recent Labs Lab 07/17/15 0426 07/20/15 0800 07/21/15 0440 07/21/15 1310 07/21/15 2011 07/22/15 0515  NA 140 138 135  --  138 132*  K 4.4 3.1* 2.8*  --  3.4* 3.0*  CL 99* 97* 95*  --  98* 93*  CO2 32 34* 32  --  32 32  GLUCOSE 83 95 82  --  86 90  BUN 12 15 15   --  12 12  CREATININE 0.91 1.17* 1.17*  --  1.07* 1.02*  CALCIUM 7.4* 7.3* 7.0*  --  7.2* 7.0*  MG  --   --   --  1.3*  --   --   AST  --  37  --   --   --  21  ALT  --  28  --   --   --  29  ALKPHOS  --  263*  --   --   --  249*  BILITOT  --  0.8  --   --   --  1.4*   ------------------------------------------------------------------------------------------------------------------ estimated creatinine  clearance is 116.8 mL/min (by C-G formula based on Cr of 1.02). ------------------------------------------------------------------------------------------------------------------ No results for input(s): HGBA1C in the last 72 hours. ------------------------------------------------------------------------------------------------------------------ No results for input(s): CHOL, HDL, LDLCALC, TRIG, CHOLHDL, LDLDIRECT in the last 72 hours. ------------------------------------------------------------------------------------------------------------------ No results for input(s): TSH, T4TOTAL, T3FREE, THYROIDAB in the last 72 hours.  Invalid input(s): FREET3 ------------------------------------------------------------------------------------------------------------------  Recent Labs  07/21/15 1310  VITAMINB12 1880*  FOLATE 5.6*  FERRITIN 62  TIBC 127*  IRON 31  RETICCTPCT 6.3*    Coagulation profile  Recent Labs Lab 07/21/15 0440  INR 1.57*    No results for input(s): DDIMER in the last 72 hours.  Cardiac Enzymes  Recent Labs Lab 07/15/15 2229  CKMB 1.0  TROPONINI <0.03   ------------------------------------------------------------------------------------------------------------------ Invalid input(s): POCBNP   CBG:  Recent Labs Lab 07/20/15 2134 07/21/15 0647 07/21/15 1157 07/21/15 1808 07/21/15 2124  GLUCAP 95 85 81 89 85       Studies: Ct Abdomen Pelvis W Contrast  07/20/2015  CLINICAL DATA:  34 year old. Vomiting. Recent colon surgery. Patient unable to tolerate p.o. contrast. EXAM: CT ABDOMEN AND PELVIS WITH CONTRAST TECHNIQUE: Multidetector CT imaging of the abdomen and pelvis was performed using the standard protocol following bolus administration of intravenous contrast. CONTRAST:  138m OMNIPAQUE IOHEXOL 300 MG/ML  SOLN COMPARISON:  06/23/2015 FINDINGS: Lower chest: No pleural effusion identified. There is atelectasis noted in the left base.  Hepatobiliary: Diffuse hepatic steatosis identified. No focal liver abnormality. The gallbladder is normal. No biliary dilatation. Pancreas: Negative. Spleen: Negative. Adrenals/Urinary Tract: The adrenal glands are normal. The kidneys are unremarkable. Urinary bladder is collapsed around a Foley catheter balloon. Stomach/Bowel: Normal appearance of the stomach. The proximal small bowel loops have a normal caliber. No bowel wall thickening or dilatation. There is diffuse wall thickening involving the colon. 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 Vascular/Lymphatic: Normal appearance of the abdominal aorta. No enlarged retroperitoneal or mesenteric adenopathy. No enlarged pelvic or inguinal lymph nodes. Reproductive: The uterus and the adnexal structures are unremarkable. Other: Within the left hemi abdomen there is a well-circumscribed fluid collection measuring 4.6 x 7.7 x 12.0 cm. This appears to exert mass effect upon the adjacent left upper quadrant small bowel loops. There is surrounding fating. stranding. Several smaller fluid collections are identified within the left lower quadrant and central pelvis. The largest of these is along the fundus of the uterus measuring 5.5 x 1.6 x 2.3 cm. Diffuse body wall edema is identified compatible with anasarca. Open ventral abdominal wall wound is identified with drain in place. Musculoskeletal: Unremarkable. IMPRESSION: 1. Examination is remarkable for several abscesses within the abdomen and pelvis. The largest is in the left hemi abdomen and has mass effect upon the small bowel loops. This has a maximum dimension of 12 cm. 2. 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 The remaining colon is diffusely edematous. 3. Hepatic steatosis. This appears progressive when compared with previous imaging. 4. No evidence to suggest small bowel obstruction. Electronically Signed   By: TKerby MoorsM.D.   On: 07/20/2015  15:55   Ct Image Guided Drainage By Percutaneous Catheter  07/21/2015  CLINICAL DATA:  Colonic perforation and status post colectomy. Development of left-sided peritoneal abscess requiring percutaneous drainage. EXAM: CT GUIDED DRAINAGE OF PERITONEAL ABSCESS ANESTHESIA/SEDATION: 150 mcg IV Fentanyl Total Moderate Sedation Time:  20 minutes PROCEDURE: The procedure, risks, benefits, and alternatives were explained to the patient. Questions regarding the procedure were encouraged and answered. The patient understands and consents to the procedure. A time-out was performed prior to the procedure. The left abdominal wall was prepped with Betadine in a sterile fashion, and a sterile drape was applied covering the operative field. A sterile gown and sterile gloves were used for the procedure. Local anesthesia was provided with 1% Lidocaine. CT was performed in a supine position. Under CT guidance, an 18 gauge trocar needle was advanced into an abscess within the left lower peritoneal cavity. Aspiration of fluid was performed and a sample sent for culture analysis. A guidewire was advanced and the tract dilated. A 12 French percutaneous drainage catheter was placed. Catheter position  was confirmed by CT. The catheter was flushed and connected to a suction bulb. It was secured at the skin with a Prolene retention suture and StatLock device. COMPLICATIONS: None FINDINGS: Aspiration at the level of the left lower quadrant abscess revealed purulent fluid. After placement of the drainage catheter, there is excellent return of purulent fluid. IMPRESSION: CT-guided percutaneous catheter drainage of left lower quadrant peritoneal abscess. A sample of purulent fluid was sent for culture analysis. Output will be followed. Electronically Signed   By: Aletta Edouard M.D.   On: 07/21/2015 13:21      Lab Results  Component Value Date   HGBA1C 5.2 04/07/2015   HGBA1C 5.6 05/02/2014   HGBA1C  07/13/2010    5.5 (NOTE)                                                                        According to the ADA Clinical Practice Recommendations for 2011, when HbA1c is used as a screening test:   >=6.5%   Diagnostic of Diabetes Mellitus           (if abnormal result  is confirmed)  5.7-6.4%   Increased risk of developing Diabetes Mellitus  References:Diagnosis and Classification of Diabetes Mellitus,Diabetes MBBU,0370,96(KRCVK 1):S62-S69 and Standards of Medical Care in         Diabetes - 2011,Diabetes Care,2011,34  (Suppl 1):S11-S61.   Lab Results  Component Value Date   LDLCALC 88 04/07/2015   CREATININE 1.02* 07/22/2015       Scheduled Meds: . ciprofloxacin  400 mg Intravenous Q12H  . docusate sodium  100 mg Oral BID  . escitalopram  10 mg Oral Daily  . feeding supplement (ENSURE ENLIVE)  237 mL Oral TID BM  . heparin  5,000 Units Subcutaneous 3 times per day  . mesalamine  500 mg Oral QID  . methylphenidate  5 mg Oral BID WC  . metoprolol succinate  50 mg Oral Daily  . metronidazole  500 mg Intravenous Q8H  . multivitamin with minerals  1 tablet Oral Daily  . pantoprazole  40 mg Oral Daily  . polyethylene glycol  17 g Oral Daily  . saccharomyces boulardii  250 mg Oral BID   Continuous Infusions: . sodium chloride 50 mL/hr at 07/21/15 1626    Principal Problem:   Postoperative intra-abdominal abscess [facility]) Active Problems:   Morbid obesity- BMI 67.5   OSA (obstructive sleep apnea)   Hepatic steatosis   Sinus tachycardia (HCC)   Iron deficiency anemia   Protein-calorie malnutrition, severe (HCC)   Crohn's disease (HCC)   Perforated sigmoid colon (HCC)   OSA on CPAP   Obesity hypoventilation syndrome ([employer])   Pulmonary hypertension (HCC)   Severe left ventricular systolic dysfunction   Physical deconditioning   Adjustment disorder with mixed anxiety and depressed mood   Hypokalemia    Time spent: 45 minutes   [facility] Pager [phone]. If 7PM-7AM, please contact  night-coverage at [email or web address], password [facility] 07/22/2015, 11:01 AM  LOS: 1 day

## 2015-07-22 NOTE — Progress Notes (Signed)
Subjective: Stable and alert. Underwent percutaneous drainage of left lower quadrant abscess by IR yesterday.  Purulent fluid returned.  The fluid is much thinner now.  Await Gram stain and culture. Tolerating diet but not very hungry.  No new ostomy problems.  Ostomy functioning. Hemoglobin 8.0.  WBC 10,000.  Stable.  Potassium 3.0.  Glucose 90.  Objective: Vital signs in last 24 hours: Temp:  [97.3 F (36.3 C)-98.2 F (36.8 C)] 97.3 F (36.3 C) (10/29 0457) Pulse Rate:  [93-113] 93 (10/29 0457) Resp:  [13-18] 18 (10/29 0457) BP: (97-129)/(58-68) 119/58 mmHg (10/29 0457) SpO2:  [92 %-100 %] 100 % (10/29 0457) Weight:  [150.594 kg (332 lb)-165.109 kg (364 lb)] 165.109 kg (364 lb) (10/29 0500)    Intake/Output from previous day: 10/28 0701 - 10/29 0700 In: 800 [I.V.:50; IV Piggyback:750] Out: 1360 [Urine:800; Drains:240; Stool:100] Intake/Output this shift:    General appearance: Alert.  Cooperative.  Doesn't appear to be in any distress.  Morbidly obese.  Deconditioned. GI: Abdomen is soft.  Nontender.  Nondistended.  Negative pressure dressing midline wound.  F sided colostomy healthy and pink.  Still with some mucocutaneous separation but no retraction whatsoever.  Lab Results:   Recent Labs  07/21/15 0440 07/22/15 0515  WBC 10.5 10.0  HGB 7.6* 8.0*  HCT 25.0* 25.4*  PLT 450* 393   BMET  Recent Labs  07/21/15 2011 07/22/15 0515  NA 138 132*  K 3.4* 3.0*  CL 98* 93*  CO2 32 32  GLUCOSE 86 90  BUN 12 12  CREATININE 1.07* 1.02*  CALCIUM 7.2* 7.0*   PT/INR  Recent Labs  07/21/15 0440  LABPROT 18.9*  INR 1.57*   ABG No results for input(s): PHART, HCO3 in the last 72 hours.  Invalid input(s): PCO2, PO2  Studies/Results: Ct Abdomen Pelvis W Contrast  07/20/2015  CLINICAL DATA:  34 year old. Vomiting. Recent colon surgery. Patient unable to tolerate p.o. contrast. EXAM: CT ABDOMEN AND PELVIS WITH CONTRAST TECHNIQUE: Multidetector CT imaging of the  abdomen and pelvis was performed using the standard protocol following bolus administration of intravenous contrast. CONTRAST:  162m OMNIPAQUE IOHEXOL 300 MG/ML  SOLN COMPARISON:  06/23/2015 FINDINGS: Lower chest: No pleural effusion identified. There is atelectasis noted in the left base. Hepatobiliary: Diffuse hepatic steatosis identified. No focal liver abnormality. The gallbladder is normal. No biliary dilatation. Pancreas: Negative. Spleen: Negative. Adrenals/Urinary Tract: The adrenal glands are normal. The kidneys are unremarkable. Urinary bladder is collapsed around a Foley catheter balloon. Stomach/Bowel: Normal appearance of the stomach. The proximal small bowel loops have a normal caliber. No bowel wall thickening or dilatation. There is diffuse wall thickening involving the colon. 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 Vascular/Lymphatic: Normal appearance of the abdominal aorta. No enlarged retroperitoneal or mesenteric adenopathy. No enlarged pelvic or inguinal lymph nodes. Reproductive: The uterus and the adnexal structures are unremarkable. Other: Within the left hemi abdomen there is a well-circumscribed fluid collection measuring 4.6 x 7.7 x 12.0 cm. This appears to exert mass effect upon the adjacent left upper quadrant small bowel loops. There is surrounding fating. stranding. Several smaller fluid collections are identified within the left lower quadrant and central pelvis. The largest of these is along the fundus of the uterus measuring 5.5 x 1.6 x 2.3 cm. Diffuse body wall edema is identified compatible with anasarca. Open ventral abdominal wall wound is identified with drain in place. Musculoskeletal: Unremarkable. IMPRESSION: 1. Examination is remarkable for several abscesses within the  abdomen and pelvis. The largest is in the left hemi abdomen and has mass effect upon the small bowel loops. This has a maximum dimension of 12 cm. 2. 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 The remaining colon is diffusely edematous. 3. Hepatic steatosis. This appears progressive when compared with previous imaging. 4. No evidence to suggest small bowel obstruction. Electronically Signed   By: Kerby Moors M.D.   On: 07/20/2015 15:55   Ct Image Guided Drainage By Percutaneous Catheter  07/21/2015  CLINICAL DATA:  Colonic perforation and status post colectomy. Development of left-sided peritoneal abscess requiring percutaneous drainage. EXAM: CT GUIDED DRAINAGE OF PERITONEAL ABSCESS ANESTHESIA/SEDATION: 150 mcg IV Fentanyl Total Moderate Sedation Time:  20 minutes PROCEDURE: The procedure, risks, benefits, and alternatives were explained to the patient. Questions regarding the procedure were encouraged and answered. The patient understands and consents to the procedure. A time-out was performed prior to the procedure. The left abdominal wall was prepped with Betadine in a sterile fashion, and a sterile drape was applied covering the operative field. A sterile gown and sterile gloves were used for the procedure. Local anesthesia was provided with 1% Lidocaine. CT was performed in a supine position. Under CT guidance, an 18 gauge trocar needle was advanced into an abscess within the left lower peritoneal cavity. Aspiration of fluid was performed and a sample sent for culture analysis. A guidewire was advanced and the tract dilated. A 12 French percutaneous drainage catheter was placed. Catheter position was confirmed by CT. The catheter was flushed and connected to a suction bulb. It was secured at the skin with a Prolene retention suture and StatLock device. COMPLICATIONS: None FINDINGS: Aspiration at the level of the left lower quadrant abscess revealed purulent fluid. After placement of the drainage catheter, there is excellent return of purulent fluid. IMPRESSION: CT-guided percutaneous catheter drainage of left lower quadrant peritoneal  abscess. A sample of purulent fluid was sent for culture analysis. Output will be followed. Electronically Signed   By: Aletta Edouard M.D.   On: 07/21/2015 13:21    Anti-infectives: Anti-infectives    Start     Dose/Rate Route Frequency Ordered Stop   07/21/15 1245  ciprofloxacin (CIPRO) IVPB 400 mg     400 mg 200 mL/hr over 60 Minutes Intravenous Every 12 hours 07/21/15 1233     07/21/15 1245  metroNIDAZOLE (FLAGYL) IVPB 500 mg     500 mg 100 mL/hr over 60 Minutes Intravenous Every 8 hours 07/21/15 1233        Assessment/Plan: Perforated descending colon, colitis POD 39,41 s/p ex lap with DRAINAGE OF INTRA-ABDOMINAL ABSCESS, PARTIAL COLON RESECTION, re-exploration and creation of colostomy and abdominal closure---Dr. Lanier Clam - wound is clean. Cont wound VAC -stoma--mucocutaneous separation, pink and viable, no purulent drainage today  -miralax -up with therapies -abd binder   Postop abdominal abscess.  Status post successful CT-guided drainage.  Await cultures.  Continue Cipro and Flagyl pending culture and sensitivity.  Hypokalemia.  Being managed by primary service. Morbid obesity BMI 67, with associated severe protein calorie malnutrition.  May need TPN  Deconditioning.  Continue PT and OT.  Back to rehabilitation when stable.     LOS: 1 day    Dominique [name] 07/22/2015

## 2015-07-23 ENCOUNTER — Inpatient Hospital Stay (HOSPITAL_COMMUNITY): Payer: Medicaid Other

## 2015-07-23 DIAGNOSIS — K50019 Crohn's disease of small intestine with unspecified complications: Secondary | ICD-10-CM

## 2015-07-23 DIAGNOSIS — T814XXA Infection following a procedure, initial encounter: Principal | ICD-10-CM

## 2015-07-23 DIAGNOSIS — K651 Peritoneal abscess: Secondary | ICD-10-CM

## 2015-07-23 DIAGNOSIS — I429 Cardiomyopathy, unspecified: Secondary | ICD-10-CM

## 2015-07-23 LAB — COMPREHENSIVE METABOLIC PANEL
ALBUMIN: 1.1 g/dL — AB (ref 3.5–5.0)
ALK PHOS: 235 U/L — AB (ref 38–126)
ALT: 26 U/L (ref 14–54)
AST: 20 U/L (ref 15–41)
Anion gap: 6 (ref 5–15)
BUN: 10 mg/dL (ref 6–20)
CHLORIDE: 94 mmol/L — AB (ref 101–111)
CO2: 31 mmol/L (ref 22–32)
CREATININE: 0.9 mg/dL (ref 0.44–1.00)
Calcium: 7 mg/dL — ABNORMAL LOW (ref 8.9–10.3)
GFR calc Af Amer: >60 mL/min (ref >60)
GFR calc non Af Amer: >60 mL/min (ref >60)
GLUCOSE: 85 mg/dL (ref 65–99)
POTASSIUM: 3.4 mmol/L — AB (ref 3.5–5.1)
SODIUM: 131 mmol/L — AB (ref 135–145)
Total Bilirubin: 1.1 mg/dL (ref 0.3–1.2)
Total Protein: 4.7 g/dL — ABNORMAL LOW (ref 6.5–8.1)

## 2015-07-23 LAB — CBC
HEMATOCRIT: 25.3 % — AB (ref 36.0–46.0)
HEMOGLOBIN: 7.6 g/dL — AB (ref 12.0–15.0)
MCH: 27.5 pg (ref 26.0–34.0)
MCHC: 30 g/dL (ref 30.0–36.0)
MCV: 91.7 fL (ref 78.0–100.0)
Platelets: 383 10*3/uL (ref 150–400)
RBC: 2.76 MIL/uL — ABNORMAL LOW (ref 3.87–5.11)
RDW: 18.7 % — AB (ref 11.5–15.5)
WBC: 9.1 10*3/uL (ref 4.0–10.5)

## 2015-07-23 LAB — MAGNESIUM: MAGNESIUM: 1.9 mg/dL (ref 1.7–2.4)

## 2015-07-23 MED ORDER — PANTOPRAZOLE SODIUM 40 MG PO TBEC
40.0000 mg | DELAYED_RELEASE_TABLET | Freq: Every day | ORAL | Status: DC
  Administered 2015-07-23 – 2015-07-25 (×3): 40 mg via ORAL
  Filled 2015-07-23 (×3): qty 1

## 2015-07-23 MED ORDER — PIPERACILLIN-TAZOBACTAM 3.375 G IVPB
3.3750 g | Freq: Three times a day (TID) | INTRAVENOUS | Status: DC
  Filled 2015-07-23 (×2): qty 50

## 2015-07-23 MED ORDER — DEXTROSE 5 % IV SOLN: 1.0000 g | INTRAVENOUS | Status: DC

## 2015-07-23 MED ORDER — DEXTROSE 5 % IV SOLN
2.0000 g | INTRAVENOUS | Status: DC
  Administered 2015-07-23 – 2015-07-25 (×3): 2 g via INTRAVENOUS
  Filled 2015-07-23 (×3): qty 2

## 2015-07-23 MED ORDER — METRONIDAZOLE IN NACL 5-0.79 MG/ML-% IV SOLN
500.0000 mg | Freq: Three times a day (TID) | INTRAVENOUS | Status: DC
  Administered 2015-07-23 – 2015-07-25 (×7): 500 mg via INTRAVENOUS
  Filled 2015-07-23 (×7): qty 100

## 2015-07-23 MED ORDER — DOCUSATE SODIUM 100 MG PO CAPS
100.0000 mg | ORAL_CAPSULE | Freq: Two times a day (BID) | ORAL | Status: DC
  Administered 2015-07-23 – 2015-07-25 (×2): 100 mg via ORAL
  Filled 2015-07-23 (×4): qty 1

## 2015-07-23 MED ORDER — METHYLPHENIDATE HCL 5 MG PO TABS
5.0000 mg | ORAL_TABLET | Freq: Once | ORAL | Status: AC
  Administered 2015-07-23: 5 mg via ORAL
  Filled 2015-07-23: qty 1

## 2015-07-23 MED ORDER — METHYLPHENIDATE HCL 5 MG PO TABS
5.0000 mg | ORAL_TABLET | Freq: Two times a day (BID) | ORAL | Status: DC
  Administered 2015-07-24 – 2015-07-25 (×4): 5 mg via ORAL
  Filled 2015-07-23 (×4): qty 1

## 2015-07-23 NOTE — Progress Notes (Signed)
Subjective: Stable and alert. States abdominal pain is mild and chronic, no change in condition Tolerated diet a little better yesterday.  No vomiting Ostomy functioning  Abdominal drain 30 mL out last 24 hours.  Looks more thin than original. Abdominal abscess  cultures growing gram-negative rods and gram-positive rods Remains on Cipro and Flagyl.  WBC 9100.  Hemoglobin 7.6, stable.  Potassium up to 3.4.  Glucose 85.  Albumin 1.1.  Objective: Vital signs in last 24 hours: Temp:  [97.3 F (36.3 C)-98.3 F (36.8 C)] 97.3 F (36.3 C) (10/30 0510) Pulse Rate:  [89-101] 89 (10/30 0510) Resp:  [16-20] 18 (10/30 0510) BP: (100-122)/(54-65) 100/54 mmHg (10/30 0510) SpO2:  [95 %-100 %] 100 % (10/30 0510) Weight:  [166.924 kg (368 lb)] 166.924 kg (368 lb) (10/30 0509)    Intake/Output from previous day: 10/29 0701 - 10/30 0700 In: 2317 [P.O.:742; I.V.:875; IV Piggyback:700] Out: 480 [Urine:400; Drains:30; Stool:50] Intake/Output this shift: Total I/O In: 1180 [P.O.:280; I.V.:500; IV Piggyback:400] Out: 80 [Drains:30; Stool:50]  General appearance: Alert.  Cooperative.  No distress.  Does not appear ill or toxic at all. GI: Abdomen soft.  Morbidly obese.  Mild diffuse tenderness nonlocalizing.  Negative pressure dressing midline is clean area did ostomy left abdomen pink, healthy, not retracted.  Still separation at mucocutaneous junction.  Drain left lower quadrant with thin cloudy fluid.  Does not look enteric.  Lab Results:   Recent Labs  07/22/15 0515 07/23/15 0445  WBC 10.0 9.1  HGB 8.0* 7.6*  HCT 25.4* 25.3*  PLT 393 383   BMET  Recent Labs  07/22/15 0515 07/23/15 0445  NA 132* 131*  K 3.0* 3.4*  CL 93* 94*  CO2 32 31  GLUCOSE 90 85  BUN 12 10  CREATININE 1.02* 0.90  CALCIUM 7.0* 7.0*   PT/INR  Recent Labs  07/21/15 0440  LABPROT 18.9*  INR 1.57*   ABG No results for input(s): PHART, HCO3 in the last 72 hours.  Invalid input(s): PCO2,  PO2  Studies/Results: Ct Image Guided Drainage By Percutaneous Catheter  07/21/2015  CLINICAL DATA:  Colonic perforation and status post colectomy. Development of left-sided peritoneal abscess requiring percutaneous drainage. EXAM: CT GUIDED DRAINAGE OF PERITONEAL ABSCESS ANESTHESIA/SEDATION: 150 mcg IV Fentanyl Total Moderate Sedation Time:  20 minutes PROCEDURE: The procedure, risks, benefits, and alternatives were explained to the patient. Questions regarding the procedure were encouraged and answered. The patient understands and consents to the procedure. A time-out was performed prior to the procedure. The left abdominal wall was prepped with Betadine in a sterile fashion, and a sterile drape was applied covering the operative field. A sterile gown and sterile gloves were used for the procedure. Local anesthesia was provided with 1% Lidocaine. CT was performed in a supine position. Under CT guidance, an 18 gauge trocar needle was advanced into an abscess within the left lower peritoneal cavity. Aspiration of fluid was performed and a sample sent for culture analysis. A guidewire was advanced and the tract dilated. A 12 French percutaneous drainage catheter was placed. Catheter position was confirmed by CT. The catheter was flushed and connected to a suction bulb. It was secured at the skin with a Prolene retention suture and StatLock device. COMPLICATIONS: None FINDINGS: Aspiration at the level of the left lower quadrant abscess revealed purulent fluid. After placement of the drainage catheter, there is excellent return of purulent fluid. IMPRESSION: CT-guided percutaneous catheter drainage of left lower quadrant peritoneal abscess. A sample of purulent fluid was  sent for culture analysis. Output will be followed. Electronically Signed   By: Aletta Edouard M.D.   On: 07/21/2015 13:21    Anti-infectives: Anti-infectives    Start     Dose/Rate Route Frequency Ordered Stop   07/21/15 1245   ciprofloxacin (CIPRO) IVPB 400 mg     400 mg 200 mL/hr over 60 Minutes Intravenous Every 12 hours 07/21/15 1233     07/21/15 1245  metroNIDAZOLE (FLAGYL) IVPB 500 mg     500 mg 100 mL/hr over 60 Minutes Intravenous Every 8 hours 07/21/15 1233        Assessment/Plan:  Perforated descending colon, colitis POD 40/42  s/p ex lap with DRAINAGE OF INTRA-ABDOMINAL ABSCESS, PARTIAL COLON RESECTION, re-exploration and creation of colostomy and abdominal closure---Dr. B. Thompson - wound is clean. Cont wound VAC -stoma--mucocutaneous separation, pink and viable, no purulent drainage today  -miralax -up with therapies -abd binder   Postop abdominal abscess. Status post successful CT-guided drainage. Polymicrobial.  Await cultures. Continue Cipro and Flagyl pending culture and sensitivity.  Hypokalemia. Being managed by primary service. Morbid obesity BMI 67, with associated severe protein calorie malnutrition. May need TPN  Deconditioning. Continue PT and OT. Back to rehabilitation when stable.   LOS: 2 days    Jamisen Hawes M 07/23/2015

## 2015-07-23 NOTE — Procedures (Signed)
Pt does not wish to wear bipap , RT will continue to monitor.

## 2015-07-23 NOTE — Progress Notes (Signed)
Echocardiogram 2D Echocardiogram has been performed.  Joelene Millin 07/23/2015, 11:48 AM

## 2015-07-23 NOTE — Progress Notes (Addendum)
[facility] PROGRESS NOTE  Dominique [name] EOF:[id number] DOB: 1981/05/02 DOA: 07/21/2015 PCP: Benito Mccreedy, MD  Length of stay: 2   Assessment/Plan: Principal Problem:   Postoperative intra-abdominal abscess ([place]) Active Problems:   Morbid obesity- BMI 67.5   OSA (obstructive sleep apnea)   Hepatic steatosis   Sinus tachycardia (HCC)   Iron deficiency anemia   Protein-calorie malnutrition, severe (HCC)   Crohn's disease (HCC)   Perforated sigmoid colon (HCC)   OSA on CPAP   Obesity hypoventilation syndrome (HCC)   Pulmonary hypertension (HCC)   Severe left ventricular systolic dysfunction   Physical deconditioning   Adjustment disorder with mixed anxiety and depressed mood   Hypokalemia    Postoperative intra-abdominal abscess 2/2 perforated sigmoid colon in setting of new Crohn's disease -Continue wound VAC, Abdominal drain 30 mL out last 24 hours -Continue routine ostomy care,Underwent percutaneous drainage of left lower quadrant abscess by IR 10/28.   Await Gram stain and culture. DC  empiric Cipro and Flagyl IV, start zosyn, as pt also has a UTI resistant to cipro  Followed by general surgery, await culture, continue antibiotics for now  POD 40/42 s/p ex lap with DRAINAGE OF INTRA-ABDOMINAL ABSCESS, PARTIAL COLON RESECTION, re-exploration and creation of colostomy and abdominal closure--by-Dr. B. Thompson -abd binder    Severe left ventricular systolic dysfunction -New finding this admission in setting of septic shock and critical illness with EF of 20% -Has been 4 weeks since last echocardiogram so we'll obtain repeat echocardiogram to see if ejection fraction has improved/recovered -Compensated at present without signs of heart failure on exam Restart IV fluids with potassium in the setting of intra-abdominal process Watch for volume overload   Hypokalemia/hypomagnesemia Replete  Hyponatremia-discontinue half normal saline, switched to normal  saline   OSA/OHA/pulmonary hypertension -Patient continues to refuse utilization of CPAP at hour of sleep   Recent E. Coli UTI -Just completed a course of Keflex on 10/25 (Fluoroquinolone resistant) -Repeat urinalysis shows UTI , switch to zosyn -Hopeful can remove catheter soon    Sinus tachycardia ([place]) -Likely related to underlying deconditioning as well as ongoing pain and anxiety -Continue preadmission beta blocker   Adjustment disorder with mixed anxiety and depressed mood -Has been started on Lexapro and Ritalin this admission by psychology -Continue to follow -Certainly contributing to patient's overall well-being and ability to participate in rehabilitative therapies   Morbid obesity- BMI 67.5/protein calorie malnutrition severe -Weight dating back to April 2016 410 pounds with current weight 332 pounds -Most recent preop human 6.7 with an albumin of 1.1 on 10/27 -Continue nutrition evaluation and treatment -Resume protein supplementation once diet resumed -Monitor for improvement in GI symptoms post drainage of intra-abdominal abscess; if does not improve may require TNA -Also mildly coagulopathic and likely related to poor nutrition as well   Hepatic steatosis -Most recent LFTs normal except for elevated alkaline phosphatase of 263 -Follow closely if required to initiate parenteral nutrition   Iron deficiency anemia -Did require transfusion postoperatively Hemoglobin stable around 2.5-4.9 -Certainly influenced by poor nutrition Iron panel consistent with anemia of chronic disease, folate is low therefore start folate supplementation   DVT prophylaxsis heparin  Code Status:      Code Status Orders        Start     Ordered   07/21/15 0928  Full code   Continuous     07/21/15 0935     Family Communication: family updated about patient's clinical progress Disposition Plan:  Pending  culture results    Brief narrative: 34 year old female patient  who was recently admitted to the acute care portion of the hospital on 9/11 secondary to severe abdominal pain related to perforated sigmoid colon in the setting of new diagnosis of Crohn's colitis. According to the EMR, the patient had been having recurrent abdominal symptoms felt to be either diverticulitis or colitis since January 2016 which required admission in February 2016. Her symptoms were quiescent until May/June 2016 when she redeveloped anorexia with associated nausea vomiting and diarrhea. She had been unable to see a gastroenterologist in the outpatient setting because of lack of insurance and referral issues but just prior to the September admission she was finally able to see a gastroenterologist. During that evaluation her most recent CT scan had been reviewed and the plan was to continue antibiotics and to return for follow-up. Unfortunately in the interim she had developed severe abdominal pain and presented to the emergency department for evaluation. Initial workup revealed perforated left colon with peritonitis and peritoneal abscess requiring emergent expiratory laparotomy on 9/16 with partial colon resection and drainage of intra-abdominal abscess. She returned to the OR on 9/18 for reexploration of the abdomen with closure and mobilization of the splenic flexure and creation of colostomy. In the immediate postop period patient had septic shock and required short-term ventilation but was eventually stabilized and transitioned out to step down and then to the general medical floor. During the course of the hospitalization an echocardiogram was obtained that demonstrated an ejection fraction of 20% with diffuse hypokinesis which was new for this patient compared to previous echocardiogram in 2011. Etiology was felt to be secondary to stress critical illness and recent septic shock. Cardiology evaluated the patient and anticipated improvement in ejection fraction over time. She also has  underlying persistent sinus tachycardia and was on beta blockers prior to admission. Cardiology also added twice a day Lasix during this hospitalization. Patient was also transfused packed red blood cells due to anemia of critical illness and hemoglobin has remained stable around 7.7-8.0. She also had issues with acute renal failure secondary to septic shock and ATN with peak creatinine of 2.8 with creatinine at time of discharge to rehabilitation 1.0. Patient reported issues with recurrent chronic anorexia with associated nausea vomiting diarrhea and abdominal pain dating back to February 2016 and during this hospitalization patient's laboratory data supported severe malnutrition with an acute on chronic component. She was subsequent discharged to the rehabilitation unit on 06/26/15.  Unfortunately since arrival to the rehabilitation unit patient has not thrived. She has continued to require a Foley catheter. She has struggled with failure to thrive and profound depression prompting nutritional as well as psychological consultation with the addition of appropriate antidepressant medications. She has also had issues of recurrent nausea and vomiting and inability to tolerate solid diet as well as liquids. This prompted gastroenterology consultation on 10/24. She eventually underwent EGD on 10/26 which revealed a small hiatal hernia as well as mildly edematous folds throughout. She was also found to have either tortuous duodenum jejunum junction at ligament of Treitz or increased edema and the gastroenterologist was unable to advance the scope passed that turn. This prompted a contrasted CT of the abdomen and pelvis which revealed an abscess (4.6 x 7.7 x 12 cm) obstructing her ligament of Treitz which goes along with her endoscopic findings. In addition there were several smaller fluid collections identified within the left lower quadrant and central pelvis with the largest measuring 5.5 X 1.6 X  2.3 cm.  Gastroenterology discussed with interventional radiology who plans to evaluate the patient for possible percutaneous drainage. Surgery has also evaluated the patient today and recommends holding off on antibiotics until after fluid collection could be drained and culture results have returned since patient is afebrile, hemodynamically stable and has no leukocytosis. Because of these above issues rehabilitative medicine has requested the patient transfer back to the acute care setting and we will reassume care of this patient as attending physicians.   Consultants:  General surgery  Procedures: CT GUIDED DRAINAGE OF PERITONEAL ABSCESS 10/28  Antibiotics: Anti-infectives    Start     Dose/Rate Route Frequency Ordered Stop   07/21/15 1245  ciprofloxacin (CIPRO) IVPB 400 mg     400 mg 200 mL/hr over 60 Minutes Intravenous Every 12 hours 07/21/15 1233     07/21/15 1245  metroNIDAZOLE (FLAGYL) IVPB 500 mg     500 mg 100 mL/hr over 60 Minutes Intravenous Every 8 hours 07/21/15 1233           HPI/Subjective: States abdominal pain is mild and chronic, no change in condition Tolerated diet a little better yesterday. No vomiting Ostomy functioning  Objective: Filed Vitals:   07/22/15 1330 07/22/15 2036 07/23/15 0509 07/23/15 0510  BP: 122/60 107/65  100/54  Pulse: 101 93  89  Temp: 98.3 F (36.8 C) 98.2 F (36.8 C)  97.3 F (36.3 C)  TempSrc: Oral Oral  Oral  Resp: 20 16  18   Height:      Weight:   166.924 kg (368 lb)   SpO2: 95% 100%  100%    Intake/Output Summary (Last 24 hours) at 07/23/15 1212 Last data filed at 07/23/15 0900  Gross per 24 hour  Intake   2539 ml  Output   1280 ml  Net   1259 ml    Exam:  General: No acute respiratory distress Lungs: Clear to auscultation bilaterally without wheezes or crackles Cardiovascular: Regular rate and rhythm without murmur gallop or rub normal S1 and S2 Abdomen: Nontender, nondistended, soft, bowel sounds positive, no  rebound, no ascites, no appreciable mass Extremities: No significant cyanosis, clubbing, or edema bilateral lower extremities     Data Review   Micro Results Recent Results (from the past 240 hour(s))  MRSA PCR Screening     Status: None   Collection Time: 07/21/15 10:06 AM  Result Value Ref Range Status   MRSA by PCR NEGATIVE NEGATIVE Final    Comment:        The GeneXpert MRSA Assay (FDA approved for NASAL specimens only), is one component of a comprehensive MRSA colonization surveillance program. It is not intended to diagnose MRSA infection nor to guide or monitor treatment for MRSA infections.   Culture, routine-abscess     Status: None (Preliminary result)   Collection Time: 07/21/15  2:08 PM  Result Value Ref Range Status   Specimen Description ABSCESS ABDOMEN  Final   Special Requests NONE  Final   Gram Stain   Final    NO WBC SEEN NO SQUAMOUS EPITHELIAL CELLS SEEN FEW GRAM NEGATIVE RODS FEW GRAM POSITIVE RODS Performed at [facility]    Culture   Final    ABUNDANT GRAM NEGATIVE RODS Performed at [facility]    Report Status PENDING  Incomplete  Anaerobic culture     Status: None (Preliminary result)   Collection Time: 07/21/15  2:09 PM  Result Value Ref Range Status   Specimen Description ABSCESS ABDOMEN  Final   Special Requests NONE  Final   Gram Stain   Final    NO WBC SEEN NO SQUAMOUS EPITHELIAL CELLS SEEN FEW GRAM NEGATIVE RODS FEW GRAM POSITIVE RODS Performed at [facility]    Culture PENDING  Incomplete   Report Status PENDING  Incomplete  Urine culture     Status: None (Preliminary result)   Collection Time: 07/22/15  6:11 PM  Result Value Ref Range Status   Specimen Description URINE, SUPRAPUBIC  Final   Special Requests NONE  Final   Culture CULTURE REINCUBATED FOR BETTER GROWTH  Final   Report Status PENDING  Incomplete    Radiology Reports Ct Abdomen Pelvis Wo Contrast  06/23/2015  CLINICAL DATA:  Wound  infection. History of perforated left colon with peritonitis and peritoneal abscess. Crohn's disease . Laparotomy 06/11/2015 and 06/09/2015 EXAM: CT ABDOMEN AND PELVIS WITHOUT CONTRAST TECHNIQUE: Multidetector CT imaging of the abdomen and pelvis was performed following the standard protocol without IV contrast. COMPARISON:  CT 06/04/2015 FINDINGS: Lower chest: New mild bibasilar atelectasis without pleural effusion. Hepatobiliary: Limited evaluation of the liver due to her large patient size and lack of intravenous contrast. No liver lesion. Gallbladder bile ducts intact. Pancreas: Mild pancreatic enlargement compared to the prior study with mild edema below the uncinate process. Possible pancreatitis. Spleen: Negative Adrenals/Urinary Tract: Negative for renal obstruction or stone. Stomach/Bowel: Negative for bowel obstruction. No significant ileus. Left colostomy. Question mild edema in the right and transverse colon, not present previously. This could be due to colitis or possibly lack of adequate distention. No intra-abdominal abscess. Vascular/Lymphatic: Negative Reproductive: Normal uterus.  No adnexal mass. Other: Small amount of free fluid in the left pelvis.  No abscess. Open wound in the midline down to the fascia. No abscess in the soft tissues. Musculoskeletal: Negative IMPRESSION: Postop left colectomy with left colostomy. Mild thickening of the right and transverse colon may reflect colitis. These areas did not show thickening on the prior study and therefore not likely to represent Crohn's colitis. Small amount of free fluid in the left pelvis.  Negative for abscess Open wound in the anterior abdominal wall. Question mild pancreatitis with mild pancreatic edema. Limited evaluation due to lack of IV contrast large patient size. Mild atelectasis in the lung bases. Electronically Signed   By: Franchot Gallo M.D.   On: 06/23/2015 19:53   Ct Abdomen Pelvis W Contrast  07/20/2015  CLINICAL DATA:   34 year old. Vomiting. Recent colon surgery. Patient unable to tolerate p.o. contrast. EXAM: CT ABDOMEN AND PELVIS WITH CONTRAST TECHNIQUE: Multidetector CT imaging of the abdomen and pelvis was performed using the standard protocol following bolus administration of intravenous contrast. CONTRAST:  138m OMNIPAQUE IOHEXOL 300 MG/ML  SOLN COMPARISON:  06/23/2015 FINDINGS: Lower chest: No pleural effusion identified. There is atelectasis noted in the left base. Hepatobiliary: Diffuse hepatic steatosis identified. No focal liver abnormality. The gallbladder is normal. No biliary dilatation. Pancreas: Negative. Spleen: Negative. Adrenals/Urinary Tract: The adrenal glands are normal. The kidneys are unremarkable. Urinary bladder is collapsed around a Foley catheter balloon. Stomach/Bowel: Normal appearance of the stomach. The proximal small bowel loops have a normal caliber. No bowel wall thickening or dilatation. There is diffuse wall thickening involving the colon. 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 Vascular/Lymphatic: Normal appearance of the abdominal aorta. No enlarged retroperitoneal or mesenteric adenopathy. No enlarged pelvic or inguinal lymph nodes. Reproductive: The uterus and the adnexal structures are unremarkable. Other: Within the  left hemi abdomen there is a well-circumscribed fluid collection measuring 4.6 x 7.7 x 12.0 cm. This appears to exert mass effect upon the adjacent left upper quadrant small bowel loops. There is surrounding fating. stranding. Several smaller fluid collections are identified within the left lower quadrant and central pelvis. The largest of these is along the fundus of the uterus measuring 5.5 x 1.6 x 2.3 cm. Diffuse body wall edema is identified compatible with anasarca. Open ventral abdominal wall wound is identified with drain in place. Musculoskeletal: Unremarkable. IMPRESSION: 1. Examination is remarkable for several abscesses  within the abdomen and pelvis. The largest is in the left hemi abdomen and has mass effect upon the small bowel loops. This has a maximum dimension of 12 cm. 2. 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 The remaining colon is diffusely edematous. 3. Hepatic steatosis. This appears progressive when compared with previous imaging. 4. No evidence to suggest small bowel obstruction. Electronically Signed   By: Kerby Moors M.D.   On: 07/20/2015 15:55   Ct Image Guided Drainage By Percutaneous Catheter  07/21/2015  CLINICAL DATA:  Colonic perforation and status post colectomy. Development of left-sided peritoneal abscess requiring percutaneous drainage. EXAM: CT GUIDED DRAINAGE OF PERITONEAL ABSCESS ANESTHESIA/SEDATION: 150 mcg IV Fentanyl Total Moderate Sedation Time:  20 minutes PROCEDURE: The procedure, risks, benefits, and alternatives were explained to the patient. Questions regarding the procedure were encouraged and answered. The patient understands and consents to the procedure. A time-out was performed prior to the procedure. The left abdominal wall was prepped with Betadine in a sterile fashion, and a sterile drape was applied covering the operative field. A sterile gown and sterile gloves were used for the procedure. Local anesthesia was provided with 1% Lidocaine. CT was performed in a supine position. Under CT guidance, an 18 gauge trocar needle was advanced into an abscess within the left lower peritoneal cavity. Aspiration of fluid was performed and a sample sent for culture analysis. A guidewire was advanced and the tract dilated. A 12 French percutaneous drainage catheter was placed. Catheter position was confirmed by CT. The catheter was flushed and connected to a suction bulb. It was secured at the skin with a Prolene retention suture and StatLock device. COMPLICATIONS: None FINDINGS: Aspiration at the level of the left lower quadrant abscess revealed purulent fluid. After  placement of the drainage catheter, there is excellent return of purulent fluid. IMPRESSION: CT-guided percutaneous catheter drainage of left lower quadrant peritoneal abscess. A sample of purulent fluid was sent for culture analysis. Output will be followed. Electronically Signed   By: Aletta Edouard M.D.   On: 07/21/2015 13:21     CBC  Recent Labs Lab 07/17/15 0646 07/20/15 0800 07/21/15 0440 07/22/15 0515 07/23/15 0445  WBC 11.7* 10.4 10.5 10.0 9.1  HGB 8.0* 7.7* 7.6* 8.0* 7.6*  HCT 27.2* 25.2* 25.0* 25.4* 25.3*  PLT 464* 413* 450* 393 383  MCV 90.4 90.6 90.6 91.4 91.7  MCH 26.6 27.7 27.5 28.8 27.5  MCHC 29.4* 30.6 30.4 31.5 30.0  RDW 20.0* 19.4* 19.4* 19.0* 18.7*  LYMPHSABS 1.9  --   --   --   --   MONOABS 1.8*  --   --   --   --   EOSABS 0.1  --   --   --   --   BASOSABS 0.0  --   --   --   --     Chemistries   Recent Labs Lab 07/20/15  0800 07/21/15 0440 07/21/15 1310 07/21/15 2011 07/22/15 0515 07/23/15 0445  NA 138 135  --  138 132* 131*  K 3.1* 2.8*  --  3.4* 3.0* 3.4*  CL 97* 95*  --  98* 93* 94*  CO2 34* 32  --  32 32 31  GLUCOSE 95 82  --  86 90 85  BUN 15 15  --  12 12 10   CREATININE 1.17* 1.17*  --  1.07* 1.02* 0.90  CALCIUM 7.3* 7.0*  --  7.2* 7.0* 7.0*  MG  --   --  1.3*  --   --  1.9  AST 37  --   --   --  21 20  ALT 28  --   --   --  29 26  ALKPHOS 263*  --   --   --  249* 235*  BILITOT 0.8  --   --   --  1.4* 1.1   ------------------------------------------------------------------------------------------------------------------ estimated creatinine clearance is 133.3 mL/min (by C-G formula based on Cr of 0.9). ------------------------------------------------------------------------------------------------------------------ No results for input(s): HGBA1C in the last 72 hours. ------------------------------------------------------------------------------------------------------------------ No results for input(s): CHOL, HDL, LDLCALC, TRIG,  CHOLHDL, LDLDIRECT in the last 72 hours. ------------------------------------------------------------------------------------------------------------------ No results for input(s): TSH, T4TOTAL, T3FREE, THYROIDAB in the last 72 hours.  Invalid input(s): FREET3 ------------------------------------------------------------------------------------------------------------------  Recent Labs  07/21/15 1310  VITAMINB12 1880*  FOLATE 5.6*  FERRITIN 62  TIBC 127*  IRON 31  RETICCTPCT 6.3*    Coagulation profile  Recent Labs Lab 07/21/15 0440  INR 1.57*    No results for input(s): DDIMER in the last 72 hours.  Cardiac Enzymes No results for input(s): CKMB, TROPONINI, MYOGLOBIN in the last 168 hours.  Invalid input(s): CK ------------------------------------------------------------------------------------------------------------------ Invalid input(s): POCBNP   CBG:  Recent Labs Lab 07/20/15 2134 07/21/15 0647 07/21/15 1157 07/21/15 1808 07/21/15 2124  GLUCAP 95 85 81 89 85       Studies: No results found.    Lab Results  Component Value Date   HGBA1C 5.2 04/07/2015   HGBA1C 5.6 05/02/2014   HGBA1C  07/13/2010    5.5 (NOTE)                                                                       According to the ADA Clinical Practice Recommendations for 2011, when HbA1c is used as a screening test:   >=6.5%   Diagnostic of Diabetes Mellitus           (if abnormal result  is confirmed)  5.7-6.4%   Increased risk of developing Diabetes Mellitus  References:Diagnosis and Classification of Diabetes Mellitus,Diabetes WERX,5400,86(PYPPJ 1):S62-S69 and Standards of Medical Care in         Diabetes - 2011,Diabetes Care,2011,34  (Suppl 1):S11-S61.   Lab Results  Component Value Date   LDLCALC 88 04/07/2015   CREATININE 0.90 07/23/2015       Scheduled Meds: . ciprofloxacin  400 mg Intravenous Q12H  . docusate sodium  100 mg Oral BID  . escitalopram  10 mg Oral  Daily  . feeding supplement (ENSURE ENLIVE)  237 mL Oral TID BM  . folic acid  1 mg Oral Daily  . heparin  5,000 Units Subcutaneous 3 times per day  .  mesalamine  500 mg Oral QID  . [START ON 07/24/2015] methylphenidate  5 mg Oral BID WC  . methylphenidate  5 mg Oral Once  . metoprolol succinate  50 mg Oral Daily  . metronidazole  500 mg Intravenous Q8H  . multivitamin with minerals  1 tablet Oral Daily  . pantoprazole  40 mg Oral Daily  . polyethylene glycol  17 g Oral Daily  . saccharomyces boulardii  250 mg Oral BID   Continuous Infusions:    Principal Problem:   Postoperative intra-abdominal abscess ([facility]) Active Problems:   Morbid obesity- BMI 67.5   OSA (obstructive sleep apnea)   Hepatic steatosis   Sinus tachycardia ([facility]CC)   Iron deficiency anemia   Protein-calorie malnutrition, severe (HCC)   Crohn's disease ([facility]CC)   Perforated sigmoid colon (HCC)   OSA on CPAP   Obesity hypoventilation syndrome ([place])   Pulmonary hypertension (HCC)   Severe left ventricular systolic dysfunction   Physical deconditioning   Adjustment disorder with mixed anxiety and depressed mood   Hypokalemia    Time spent: 45 minutes   [facility] Pager [phone]. If 7PM-7AM, please contact night-coverage at [email or web address], password [email or web address] 07/23/2015, 12:12 PM  LOS: 2 days

## 2015-07-23 NOTE — Procedures (Signed)
Pt does not wish to wear cpap , RT will continue to monitor.

## 2015-07-24 DIAGNOSIS — K5000 Crohn's disease of small intestine without complications: Secondary | ICD-10-CM

## 2015-07-24 LAB — CULTURE, ROUTINE-ABSCESS: GRAM STAIN: NONE SEEN

## 2015-07-24 MED ORDER — POTASSIUM CHLORIDE CRYS ER 20 MEQ PO TBCR
40.0000 meq | EXTENDED_RELEASE_TABLET | Freq: Two times a day (BID) | ORAL | Status: AC
  Administered 2015-07-24: 40 meq via ORAL
  Filled 2015-07-24: qty 2

## 2015-07-24 MED ORDER — SODIUM CHLORIDE 0.9 % IV SOLN
INTRAVENOUS | Status: DC
  Administered 2015-07-24: 11:00:00 via INTRAVENOUS
  Filled 2015-07-24 (×4): qty 1000

## 2015-07-24 NOTE — Progress Notes (Signed)
Pharmacist Provided - Patient Medication Education Prior to Discharge   Dominique [name] is an 34 y.o. female who presented to [facility] on 07/21/2015 with a chief complaint of No chief complaint on file.  The following medications were discussed with the patient:  Pain Control medications: []  Yes    [x]  No  Diabetes Medications: []  Yes    [x]  No  Heart Failure Medications: []  Yes    [x]  No  Anticoagulation Medications:  []  Yes    []  No  Antibiotics at discharge: [x]  Yes    []  No  Allergy Assessment Completed and Updated: [x]  Yes    []  No Identified Patient Allergies:  Allergies  Allergen Reactions  . Other Shortness Of Breath and Swelling    Tree nuts  . Penicillins Other (See Comments)    Unknown childhood allergy     Medication Adherence Assessment: []  Excellent (no doses missed/week)      [x]  Good (1 dose missed/week)      []  Partial (2-3 doses missed/week)      []  Poor (>3 doses missed/week)  Barriers to Obtaining Medications: []  Yes [x]  No  Dominique [name] expressed no concern with obtaining medications as she is on medicaid.   Assessment: 34 y/o F with intra-abdominal abscess. Family wanted to speak with RPh about medications but unfortunately no family was present. Spoke with patient about medications and any issues obtaining medications. Pt stated she had no questions and no barriers to her medication regimen at home. Spoke with the patient about her abx regimen and potential side effects.   Time spent preparing for discharge counseling: 15 min Time spent counseling patient: 15 min  Angela Burke, PharmD Pharmacy Resident Pager: [phone] 07/24/2015, 1:35 PM

## 2015-07-24 NOTE — Progress Notes (Signed)
[facility] PROGRESS NOTE  Dominique [name] HFW:[id number] DOB: [birth year] DOA: 07/21/2015 PCP: Benito Mccreedy, MD  Length of stay: 3   Assessment/Plan: Principal Problem:   Postoperative intra-abdominal abscess ([place]) Active Problems:   Morbid obesity- BMI 67.5   OSA (obstructive sleep apnea)   Hepatic steatosis   Sinus tachycardia (HCC)   Iron deficiency anemia   Protein-calorie malnutrition, severe (HCC)   Crohn's disease (HCC)   Perforated sigmoid colon (HCC)   OSA on CPAP   Obesity hypoventilation syndrome (HCC)   Pulmonary hypertension (HCC)   Severe left ventricular systolic dysfunction   Physical deconditioning   Adjustment disorder with mixed anxiety and depressed mood   Hypokalemia    Postoperative intra-abdominal abscess 2/2 perforated sigmoid colon in setting of new Crohn's disease -Continue wound VAC, Abdominal drain 18 mL out last 24 hours. Continued drain pending further surgery recommendations.Underwent percutaneous drainage of left lower quadrant abscess by IR 10/28.   Await Gram stain and culture. -Continue routine ostomy care by wound care, DC  empiric Cipro and Flagyl IV, currently on Rocephin and Flagyl, for UTI and intra-abdominal abscess  Followed by general surgery, await culture, continue antibiotics for now   POD 41/42 s/p ex lap with DRAINAGE OF INTRA-ABDOMINAL ABSCESS, PARTIAL COLON RESECTION, re-exploration and creation of colostomy and abdominal closure--by-Dr. B. Thompson -Continue abd binder , wound care dressing changes by WOC   Severe left ventricular systolic dysfunction -New finding this admission in setting of septic shock and critical illness with EF of 20% Repeat echocardiogram shows that patient's cardiac function has recovered. EF of 55% -Compensated at present without signs of heart failure on exam   Hypokalemia/hypomagnesemia Replete  Hyponatremia-likely negative to poor oral intake, will place on fluid restriction  and restart normal saline   OSA/OHA/pulmonary hypertension -Patient continues to refuse utilization of CPAP at hour of sleep   Recent E. Coli UTI -Just completed a course of Keflex on 10/25 (Fluoroquinolone resistant) Remove Foley catheter next day or 2. Patient states that she's had it for 2 weeks    Sinus tachycardia ([place]) -Likely related to underlying deconditioning as well as ongoing pain and anxiety -Continue preadmission beta blocker   Adjustment disorder with mixed anxiety and depressed mood Continue Lexapro and Ritalin   -Continue to follow -Certainly contributing to patient's overall well-being and ability to participate in rehabilitative therapies   Morbid obesity- BMI 67.5/protein calorie malnutrition severe -Weight dating back to April 2016 410 pounds with current weight 332 pounds -Most recent preop human 6.7 with an albumin of 1.1 on 10/27 -Continue nutrition evaluation and treatment -Resume protein supplementation once diet resumed -Monitor for improvement in GI symptoms post drainage of intra-abdominal abscess; if does not improve may require TNA -Also mildly coagulopathic and likely related to poor nutrition as well   Hepatic steatosis -Most recent LFTs normal except for elevated alkaline phosphatase of 263 -Follow closely if required to initiate parenteral nutrition   Iron deficiency anemia -Did require transfusion postoperatively Hemoglobin stable around 0.2-7.7 -Certainly influenced by poor nutrition Iron panel consistent with anemia of chronic disease, folate is low therefore start folate supplementation   DVT prophylaxsis heparin  Code Status:      Code Status Orders        Start     Ordered   07/21/15 0928  Full code   Continuous     07/21/15 0935     Family Communication: family updated about patient's clinical progress Disposition Plan: SNF  Brief narrative: 34 year old female patient who was recently admitted to the acute  care portion of the hospital on 9/11 secondary to severe abdominal pain related to perforated sigmoid colon in the setting of new diagnosis of Crohn's colitis. According to the EMR, the patient had been having recurrent abdominal symptoms felt to be either diverticulitis or colitis since January 2016 which required admission in February 2016. Her symptoms were quiescent until May/June 2016 when she redeveloped anorexia with associated nausea vomiting and diarrhea. She had been unable to see a gastroenterologist in the outpatient setting because of lack of insurance and referral issues but just prior to the September admission she was finally able to see a gastroenterologist. During that evaluation her most recent CT scan had been reviewed and the plan was to continue antibiotics and to return for follow-up. Unfortunately in the interim she had developed severe abdominal pain and presented to the emergency department for evaluation. Initial workup revealed perforated left colon with peritonitis and peritoneal abscess requiring emergent expiratory laparotomy on 9/16 with partial colon resection and drainage of intra-abdominal abscess. She returned to the OR on 9/18 for reexploration of the abdomen with closure and mobilization of the splenic flexure and creation of colostomy. In the immediate postop period patient had septic shock and required short-term ventilation but was eventually stabilized and transitioned out to step down and then to the general medical floor. During the course of the hospitalization an echocardiogram was obtained that demonstrated an ejection fraction of 20% with diffuse hypokinesis which was new for this patient compared to previous echocardiogram in 2011. Etiology was felt to be secondary to stress critical illness and recent septic shock. Cardiology evaluated the patient and anticipated improvement in ejection fraction over time. She also has underlying persistent sinus tachycardia and was  on beta blockers prior to admission. Cardiology also added twice a day Lasix during this hospitalization. Patient was also transfused packed red blood cells due to anemia of critical illness and hemoglobin has remained stable around 7.7-8.0. She also had issues with acute renal failure secondary to septic shock and ATN with peak creatinine of 2.8 with creatinine at time of discharge to rehabilitation 1.0. Patient reported issues with recurrent chronic anorexia with associated nausea vomiting diarrhea and abdominal pain dating back to February 2016 and during this hospitalization patient's laboratory data supported severe malnutrition with an acute on chronic component. She was subsequent discharged to the rehabilitation unit on 06/26/15.  Unfortunately since arrival to the rehabilitation unit patient has not thrived. She has continued to require a Foley catheter. She has struggled with failure to thrive and profound depression prompting nutritional as well as psychological consultation with the addition of appropriate antidepressant medications. She has also had issues of recurrent nausea and vomiting and inability to tolerate solid diet as well as liquids. This prompted gastroenterology consultation on 10/24. She eventually underwent EGD on 10/26 which revealed a small hiatal hernia as well as mildly edematous folds throughout. She was also found to have either tortuous duodenum jejunum junction at ligament of Treitz or increased edema and the gastroenterologist was unable to advance the scope passed that turn. This prompted a contrasted CT of the abdomen and pelvis which revealed an abscess (4.6 x 7.7 x 12 cm) obstructing her ligament of Treitz which goes along with her endoscopic findings. In addition there were several smaller fluid collections identified within the left lower quadrant and central pelvis with the largest measuring 5.5 X 1.6 X 2.3 cm. Gastroenterology discussed with  interventional radiology  who plans to evaluate the patient for possible percutaneous drainage. Surgery has also evaluated the patient today and recommends holding off on antibiotics until after fluid collection could be drained and culture results have returned since patient is afebrile, hemodynamically stable and has no leukocytosis. Because of these above issues rehabilitative medicine has requested the patient transfer back to the acute care setting and we will reassume care of this patient as attending physicians.   Consultants:  General surgery  Procedures: CT GUIDED DRAINAGE OF PERITONEAL ABSCESS 10/28  Antibiotics: Anti-infectives    Start     Dose/Rate Route Frequency Ordered Stop   07/23/15 1400  piperacillin-tazobactam (ZOSYN) IVPB 3.375 g  Status:  Discontinued     3.375 g 12.5 mL/hr over 240 Minutes Intravenous 3 times per day 07/23/15 1225 07/23/15 1232   07/23/15 1400  cefTRIAXone (ROCEPHIN) 2 g in dextrose 5 % 50 mL IVPB     2 g 100 mL/hr over 30 Minutes Intravenous Every 24 hours 07/23/15 1237     07/23/15 1400  metroNIDAZOLE (FLAGYL) IVPB 500 mg     500 mg 100 mL/hr over 60 Minutes Intravenous Every 8 hours 07/23/15 1237     07/23/15 1230  cefTRIAXone (ROCEPHIN) 1 g in dextrose 5 % 50 mL IVPB  Status:  Discontinued     1 g 100 mL/hr over 30 Minutes Intravenous Every 24 hours 07/23/15 1215 07/23/15 1216   07/21/15 1245  ciprofloxacin (CIPRO) IVPB 400 mg  Status:  Discontinued     400 mg 200 mL/hr over 60 Minutes Intravenous Every 12 hours 07/21/15 1233 07/23/15 1216   07/21/15 1245  metroNIDAZOLE (FLAGYL) IVPB 500 mg  Status:  Discontinued     500 mg 100 mL/hr over 60 Minutes Intravenous Every 8 hours 07/21/15 1233 07/23/15 1216         HPI/Subjective: no change in condition, no n,v , minimal AP  No sob, refusing cpap  Ostomy functioning  Objective: Filed Vitals:   07/23/15 1345 07/23/15 2125 07/24/15 0203 07/24/15 0516  BP: 104/55 103/44  102/61  Pulse: 102 93  88  Temp: 97.9  F (36.6 C) 98.3 F (36.8 C)  97.6 F (36.4 C)  TempSrc: Oral Oral  Oral  Resp: 18 18  18   Height:      Weight:   169.192 kg (373 lb)   SpO2: 100% 99%  100%    Intake/Output Summary (Last 24 hours) at 07/24/15 1008 Last data filed at 07/24/15 0956  Gross per 24 hour  Intake    570 ml  Output    969 ml  Net   -399 ml    Exam:  General: No acute respiratory distress Lungs: Clear to auscultation bilaterally without wheezes or crackles Cardiovascular: Regular rate and rhythm without murmur gallop or rub normal S1 and S2 Abdomen:Full thickness midline abd wound,  nondistended, soft, bowel sounds positive, no rebound, no ascites, no appreciable mass Extremities: No significant cyanosis, clubbing, or edema bilateral lower extremities     Data Review   Micro Results Recent Results (from the past 240 hour(s))  MRSA PCR Screening     Status: None   Collection Time: 07/21/15 10:06 AM  Result Value Ref Range Status   MRSA by PCR NEGATIVE NEGATIVE Final    Comment:        The GeneXpert MRSA Assay (FDA approved for NASAL specimens only), is one component of a comprehensive MRSA colonization surveillance program. It is not intended to diagnose  MRSA infection nor to guide or monitor treatment for MRSA infections.   Culture, routine-abscess     Status: None   Collection Time: 07/21/15  2:08 PM  Result Value Ref Range Status   Specimen Description ABSCESS ABDOMEN  Final   Special Requests NONE  Final   Gram Stain   Final    NO WBC SEEN NO SQUAMOUS EPITHELIAL CELLS SEEN FEW GRAM NEGATIVE RODS FEW GRAM POSITIVE RODS Performed at [facility]    Culture   Final    ABUNDANT ESCHERICHIA COLI Performed at [facility]    Report Status 07/24/2015 FINAL  Final   Organism ID, Bacteria ESCHERICHIA COLI  Final      Susceptibility   Escherichia coli - MIC*    AMPICILLIN >=32 RESISTANT Resistant     AMPICILLIN/SULBACTAM >=32 RESISTANT Resistant     CEFAZOLIN 8  RESISTANT Resistant     CEFEPIME <=1 SENSITIVE Sensitive     CEFTAZIDIME <=1 SENSITIVE Sensitive     CEFTRIAXONE <=1 SENSITIVE Sensitive     CIPROFLOXACIN >=4 RESISTANT Resistant     GENTAMICIN >=16 RESISTANT Resistant     IMIPENEM <=0.25 SENSITIVE Sensitive     PIP/TAZO <=4 SENSITIVE Sensitive     TOBRAMYCIN 8 INTERMEDIATE Intermediate     TRIMETH/SULFA Value in next row Resistant      >=320 RESISTANT(NOTE)    * ABUNDANT ESCHERICHIA COLI  Anaerobic culture     Status: None (Preliminary result)   Collection Time: 07/21/15  2:09 PM  Result Value Ref Range Status   Specimen Description ABSCESS ABDOMEN  Final   Special Requests NONE  Final   Gram Stain   Final    NO WBC SEEN NO SQUAMOUS EPITHELIAL CELLS SEEN FEW GRAM NEGATIVE RODS FEW GRAM POSITIVE RODS Performed at [facility]    Culture   Final    NO ANAEROBES ISOLATED; CULTURE IN PROGRESS FOR 5 DAYS Performed at [facility]    Report Status PENDING  Incomplete  Urine culture     Status: None (Preliminary result)   Collection Time: 07/22/15  6:11 PM  Result Value Ref Range Status   Specimen Description URINE, SUPRAPUBIC  Final   Special Requests NONE  Final   Culture CULTURE REINCUBATED FOR BETTER GROWTH  Final   Report Status PENDING  Incomplete    Radiology Reports Ct Abdomen Pelvis W Contrast  07/20/2015  CLINICAL DATA:  34 year old. Vomiting. Recent colon surgery. Patient unable to tolerate p.o. contrast. EXAM: CT ABDOMEN AND PELVIS WITH CONTRAST TECHNIQUE: Multidetector CT imaging of the abdomen and pelvis was performed using the standard protocol following bolus administration of intravenous contrast. CONTRAST:  121m OMNIPAQUE IOHEXOL 300 MG/ML  SOLN COMPARISON:  06/23/2015 FINDINGS: Lower chest: No pleural effusion identified. There is atelectasis noted in the left base. Hepatobiliary: Diffuse hepatic steatosis identified. No focal liver abnormality. The gallbladder is normal. No biliary dilatation.  Pancreas: Negative. Spleen: Negative. Adrenals/Urinary Tract: The adrenal glands are normal. The kidneys are unremarkable. Urinary bladder is collapsed around a Foley catheter balloon. Stomach/Bowel: Normal appearance of the stomach. The proximal small bowel loops have a normal caliber. No bowel wall thickening or dilatation. There is diffuse wall thickening involving the colon. 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 Vascular/Lymphatic: Normal appearance of the abdominal aorta. No enlarged retroperitoneal or mesenteric adenopathy. No enlarged pelvic or inguinal lymph nodes. Reproductive: The uterus and the adnexal structures are unremarkable. Other: Within the left hemi abdomen  there is a well-circumscribed fluid collection measuring 4.6 x 7.7 x 12.0 cm. This appears to exert mass effect upon the adjacent left upper quadrant small bowel loops. There is surrounding fating. stranding. Several smaller fluid collections are identified within the left lower quadrant and central pelvis. The largest of these is along the fundus of the uterus measuring 5.5 x 1.6 x 2.3 cm. Diffuse body wall edema is identified compatible with anasarca. Open ventral abdominal wall wound is identified with drain in place. Musculoskeletal: Unremarkable. IMPRESSION: 1. Examination is remarkable for several abscesses within the abdomen and pelvis. The largest is in the left hemi abdomen and has mass effect upon the small bowel loops. This has a maximum dimension of 12 cm. 2. 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 The remaining colon is diffusely edematous. 3. Hepatic steatosis. This appears progressive when compared with previous imaging. 4. No evidence to suggest small bowel obstruction. Electronically Signed   By: Kerby Moors M.D.   On: 07/20/2015 15:55   Ct Image Guided Drainage By Percutaneous Catheter  07/21/2015  CLINICAL DATA:  Colonic perforation and status post  colectomy. Development of left-sided peritoneal abscess requiring percutaneous drainage. EXAM: CT GUIDED DRAINAGE OF PERITONEAL ABSCESS ANESTHESIA/SEDATION: 150 mcg IV Fentanyl Total Moderate Sedation Time:  20 minutes PROCEDURE: The procedure, risks, benefits, and alternatives were explained to the patient. Questions regarding the procedure were encouraged and answered. The patient understands and consents to the procedure. A time-out was performed prior to the procedure. The left abdominal wall was prepped with Betadine in a sterile fashion, and a sterile drape was applied covering the operative field. A sterile gown and sterile gloves were used for the procedure. Local anesthesia was provided with 1% Lidocaine. CT was performed in a supine position. Under CT guidance, an 18 gauge trocar needle was advanced into an abscess within the left lower peritoneal cavity. Aspiration of fluid was performed and a sample sent for culture analysis. A guidewire was advanced and the tract dilated. A 12 French percutaneous drainage catheter was placed. Catheter position was confirmed by CT. The catheter was flushed and connected to a suction bulb. It was secured at the skin with a Prolene retention suture and StatLock device. COMPLICATIONS: None FINDINGS: Aspiration at the level of the left lower quadrant abscess revealed purulent fluid. After placement of the drainage catheter, there is excellent return of purulent fluid. IMPRESSION: CT-guided percutaneous catheter drainage of left lower quadrant peritoneal abscess. A sample of purulent fluid was sent for culture analysis. Output will be followed. Electronically Signed   By: Aletta Edouard M.D.   On: 07/21/2015 13:21     CBC  Recent Labs Lab 07/20/15 0800 07/21/15 0440 07/22/15 0515 07/23/15 0445  WBC 10.4 10.5 10.0 9.1  HGB 7.7* 7.6* 8.0* 7.6*  HCT 25.2* 25.0* 25.4* 25.3*  PLT 413* 450* 393 383  MCV 90.6 90.6 91.4 91.7  MCH 27.7 27.5 28.8 27.5  MCHC 30.6 30.4  31.5 30.0  RDW 19.4* 19.4* 19.0* 18.7*    Chemistries   Recent Labs Lab 07/20/15 0800 07/21/15 0440 07/21/15 1310 07/21/15 2011 07/22/15 0515 07/23/15 0445  NA 138 135  --  138 132* 131*  K 3.1* 2.8*  --  3.4* 3.0* 3.4*  CL 97* 95*  --  98* 93* 94*  CO2 34* 32  --  32 32 31  GLUCOSE 95 82  --  86 90 85  BUN 15 15  --  12 12 10   CREATININE  1.17* 1.17*  --  1.07* 1.02* 0.90  CALCIUM 7.3* 7.0*  --  7.2* 7.0* 7.0*  MG  --   --  1.3*  --   --  1.9  AST 37  --   --   --  21 20  ALT 28  --   --   --  29 26  ALKPHOS 263*  --   --   --  249* 235*  BILITOT 0.8  --   --   --  1.4* 1.1   ------------------------------------------------------------------------------------------------------------------ estimated creatinine clearance is 134.6 mL/min (by C-G formula based on Cr of 0.9). ------------------------------------------------------------------------------------------------------------------ No results for input(s): HGBA1C in the last 72 hours. ------------------------------------------------------------------------------------------------------------------ No results for input(s): CHOL, HDL, LDLCALC, TRIG, CHOLHDL, LDLDIRECT in the last 72 hours. ------------------------------------------------------------------------------------------------------------------ No results for input(s): TSH, T4TOTAL, T3FREE, THYROIDAB in the last 72 hours.  Invalid input(s): FREET3 ------------------------------------------------------------------------------------------------------------------  Recent Labs  07/21/15 1310  VITAMINB12 1880*  FOLATE 5.6*  FERRITIN 62  TIBC 127*  IRON 31  RETICCTPCT 6.3*    Coagulation profile  Recent Labs Lab 07/21/15 0440  INR 1.57*    No results for input(s): DDIMER in the last 72 hours.  Cardiac Enzymes No results for input(s): CKMB, TROPONINI, MYOGLOBIN in the last 168 hours.  Invalid input(s):  CK ------------------------------------------------------------------------------------------------------------------ Invalid input(s): POCBNP   CBG:  Recent Labs Lab 07/20/15 2134 07/21/15 0647 07/21/15 1157 07/21/15 1808 07/21/15 2124  GLUCAP 95 85 81 89 85       Studies: No results found.    Lab Results  Component Value Date   HGBA1C 5.2 04/07/2015   HGBA1C 5.6 05/02/2014   HGBA1C  07/13/2010    5.5 (NOTE)                                                                       According to the ADA Clinical Practice Recommendations for 2011, when HbA1c is used as a screening test:   >=6.5%   Diagnostic of Diabetes Mellitus           (if abnormal result  is confirmed)  5.7-6.4%   Increased risk of developing Diabetes Mellitus  References:Diagnosis and Classification of Diabetes Mellitus,Diabetes SWNI,6270,35(KKXFG 1):S62-S69 and Standards of Medical Care in         Diabetes - 2011,Diabetes Care,2011,34  (Suppl 1):S11-S61.   Lab Results  Component Value Date   LDLCALC 88 04/07/2015   CREATININE 0.90 07/23/2015       Scheduled Meds: . cefTRIAXone (ROCEPHIN)  IV  2 g Intravenous Q24H  . docusate sodium  100 mg Oral BID  . escitalopram  10 mg Oral Daily  . feeding supplement (ENSURE ENLIVE)  237 mL Oral TID BM  . folic acid  1 mg Oral Daily  . heparin  5,000 Units Subcutaneous 3 times per day  . mesalamine  500 mg Oral QID  . methylphenidate  5 mg Oral BID WC  . metoprolol succinate  50 mg Oral Daily  . metronidazole  500 mg Intravenous Q8H  . multivitamin with minerals  1 tablet Oral Daily  . pantoprazole  40 mg Oral Daily  . polyethylene glycol  17 g Oral Daily  . potassium chloride  40 mEq Oral BID  .  saccharomyces boulardii  250 mg Oral BID   Continuous Infusions:    Principal Problem:   Postoperative intra-abdominal abscess ([facility]) Active Problems:   Morbid obesity- BMI 67.5   OSA (obstructive sleep apnea)   Hepatic steatosis   Sinus tachycardia  (HCC)   Iron deficiency anemia   Protein-calorie malnutrition, severe (HCC)   Crohn's disease ([facility]CC)   Perforated sigmoid colon (HCC)   OSA on CPAP   Obesity hypoventilation syndrome ([place])   Pulmonary hypertension (HCC)   Severe left ventricular systolic dysfunction   Physical deconditioning   Adjustment disorder with mixed anxiety and depressed mood   Hypokalemia    Time spent: 45 minutes   [facility] Pager [phone]. If 7PM-7AM, please contact night-coverage at [email or web address], password [facility] 07/24/2015, 10:08 AM  LOS: 3 days

## 2015-07-24 NOTE — Progress Notes (Signed)
Rehab Admissions Coordinator Note:  Patient was screened by [name] for appropriateness for an Inpatient Acute Rehab Consult.  At this time, we are recommending [facility]. Patient was on inpatient rehab, but she will not need to readmit to inpatient rehab.  She will need SNF placement from acute hospital once she is medically ready for discharge.  [name] 07/24/2015, 8:31 AM  I can be reached at [phone].

## 2015-07-24 NOTE — Discharge Instructions (Signed)
Vacuum-Assisted Closure Therapy Vacuum-assisted closure (VAC) therapy uses a device that removes fluid and germs from wounds to help them heal. It is used on wounds that cannot be closed with stitches. They often heal slowly. Vacuum-assisted therapy helps the wound stay clean and healthy while the open wound slowly grows back together. Vacuum-assisted closure therapy uses a bandage (dressing) that is made of foam. It is put inside the wound. Then, a drape is placed over the wound. This drape sticks to your skin to keep air out, and to protect the wound. A tube is hooked up to a small pump and is attached to the drape. The pump sucks out the fluid and germs. Vacuum-assisted closure therapy can also help reduce the bad smell that comes from the wound. HOW DOES IT WORK?  The vacuum pump pulls fluid through the foam dressing. The dressing may wrinkle during this process. The fluid goes into the tube and away from the wound. The fluid then goes into a container. The fluid in the container must be replaced if it is full or at least once a week, even if the container is not full. The pulling from the pump helps to close the wound and bring better circulation to the wound area. The foam dressing covers and protects the wound. It helps your wound heal faster.  HOW DOES IT FEEL?   You might feel a little pulling when the pump is on.  You might also feel a mild vibrating sensation.  You might feel some discomfort when the dressing is taken off. CAN I MOVE AROUND WITH VACUUM-ASSISTED CLOSURE THERAPY? Yes, it has a backup battery which is used when the machine is not plugged in, as long as the battery is working, you can move freely. WHAT ARE SOME THINGS I MUST KNOW?  Do not turn off the pump yourself, unless instructed to do so by your healthcare provider, such as for bathing.  Do not take off the dressing yourself, unless instructed to do so by your caregiver.  You can wash or shower with the dressing.  However, do not take the pump into the shower. Make sure the wound dressing is protected and covered with plastic. The wound area must stay dry.  Do not turn off the pump for more than 2 hours. If the pump is off for more than 2 hours, your nurse must change your dressing.  Check frequently that the machine is on, that the machine indicates the therapy is on, and that all clamps are open. THE ALARM IS SOUNDING! WHAT SHOULD I DO?   Stay calm.  Do not turn off the pump or do anything with the dressing.  Call your clinic or caregiver right away if the alarm goes off and you cannot fix the problem. Some reasons the alarm might go off include:  The fluid collection container is full.  The battery is low.  The dressing has a leak.  Explain to your caregiver what is happening. Follow the instructions you receive. WHEN SHOULD I CALL FOR HELP?   You have severe pain.  You have difficulty breathing.  You have bleeding that will not stop.  Your wound smells bad.  You have redness, swelling, or fluid leaking from your wound.  Your alarm goes off and you do not know what to do.  You have a fever.  Your wound itches severely.  Your dressing changes are often painful or bleeding often occurs.  You have diarrhea.  You have a sore throat.  You have a rash around the dressing or anywhere else on your body.  You feel nauseous.  You feel dizzy or weak.  The [facility] machine has been off for more than 2 hours. HOW DO I GET READY TO GO HOME WITH A PUMP?  A trained caregiver will talk to you and answer your questions about your vacuum-assisted closure therapy before you go home. He or she will explain what to expect. A caregiver will come to your home to apply the pump and care for your wound. The at-home caregiver will be available for questions and will come back for the scheduled dressing changes, usually every 48-72 hours (or more often for severely infected wounds). Your at-home  caregiver will also come if you are having an unexpected problem. If you have questions or do not know what to do when you go home, talk to your healthcare provider.   This information is not intended to replace advice given to you by your health care provider. Make sure you discuss any questions you have with your health care provider.   Document Released: 08/22/2008 Document Revised: 05/12/2013 Document Reviewed: 08/23/2011 Elsevier Interactive Patient Education 2016 [place] A colostomy is an opening for stool to leave your body when a medical condition prevents it from leaving through the usual opening (rectum). During a surgery, a piece of large intestine (colon) is brought through a hole in the abdominal wall. The new opening is called a stoma or ostomy. A bag or pouch fits over the stoma to catch stool and gas. Your stool may be liquid, somewhat pasty, or formed. CARING FOR YOUR STOMA  Normally, the stoma looks a lot like the inside of your cheek: pink, red, and moist. At first it may be swollen, but this swelling will decrease within 6 weeks. Keep the skin around your stoma clean and dry. You can gently wash your stoma and the skin around your stoma in the shower with a clean, soft washcloth. If you develop any skin irritation, your caregiver may give you a stoma powder or ointment to help heal the area. Do not use any products other than those specifically given to you by your caregiver.  Your stoma should not be uncomfortable. If you notice any stinging or burning, your pouch may be leaking, and the skin around your stoma may be coming into contact with stool. This can cause skin irritation. If you notice stinging, replace your pouch with a new one and discard the old one. OSTOMY POUCHES  The pouch that fits over the ostomy can be made up of either 1 or 2 pieces. A one-piece pouch has a skin barrier piece and the pouch itself in one unit. A two-piece pouch has a  skin barrier with a separate pouch that snaps on and off of the skin barrier. Either way, you should empty the pouch when it is only  to  full. Do not let more stool or gas build up. This could cause the pouch to leak. Some ostomy bags have a built-in gas release valve. Ostomy deodorizer (5 drops) can be put into the pouch to prevent odor. Some people use ostomy lubricant drops inside the pouch to help the stool slide out of the bag more easily and completely.  EMPTYING YOUR OSTOMY POUCH  You may get lessons on how to empty your pouch from a wound-ostomy nurse before you leave the hospital. Here are the basic steps:  Wash your hands with soap and  water.  Sit far back on the toilet.  Put several pieces of toilet paper into the toilet water. This will prevent splashing as you empty the stool into the toilet bowl.  Unclip or unvelcro the tail end of the pouch.  Unroll the tail and empty stool into the toilet.  Clean the tail with toilet paper.  Reroll the tail, and clip or velcro it closed.  Wash your hands again. CHANGING YOUR OSTOMY POUCH  Change your ostomy pouch about every 3 to 4 days for the first 6 weeks, then every 5 to7 days. Always change the bag sooner if there is any leakage or you begin to notice any discomfort or irritation of the skin around the stoma. When possible, plan to change your ostomy pouch before eating or drinking as this will lessen the chance of stool coming out during the pouch change. A wound-ostomy nurse may teach you how to change your pouch before you leave the hospital. Here are the basic steps:  Lay out your supplies.  Wash your hands with soap and water.  Carefully remove the old pouch.  Wash the stoma and allow it to dry. Men may be advised to shave any hair around the stoma very carefully. This will make the adhesive stick better.  Use the stoma measuring guide that comes with your pouch set to decide what size hole you will need to cut in the skin  barrier piece. Choose the smallest possible size that will hold the stoma but will not touch it.  Use the guide to trace the circle on the back of the skin barrier piece. Cut out the hole.  Hold the skin barrier piece over the stoma to make sure the hole is the correct size.  Remove the adhesive paper backing from the skin barrier piece.  Squeeze stoma paste around the opening of the skin barrier piece.  Clean and dry the skin around the stoma again.  Carefully fit the skin barrier piece over your stoma.  If you are using a two-piece pouch, snap the pouch onto the skin barrier piece.  Close the tail of the pouch.  Put your hand over the top of the skin barrier piece to help warm it for about 5 minutes, so that it conforms to your body better.  Wash your hands again. DIET TIPS   Continue to follow your usual diet.  Drink about eight 8 oz glasses of water each day.  You can prevent gas by eating slowly and chewing your food thoroughly.  If you feel concerned that you have too much gas, you can cut back on gas-producing foods, such as:  Spicy foods.  Onions and garlic.  Cruciferous vegetables (cabbage, broccoli, cauliflower, Brussels sprouts).  Beans and legumes.  Some cheeses.  Eggs.  Fish.  Bubbly (carbonated) drinks.  Chewing gum. GENERAL TIPS   You can shower with or without the bag in place.  Always keep the bag on if you are bathing or swimming.  If your bag gets wet, you can dry it with a blow-dryer set to cool.  Avoid wearing tight clothing directly over your stoma so that it does not become irritated or bleed. Tight clothing can also prevent stool from draining into the pouch.  It is helpful to always have an extra skin barrier and pouch with you when traveling. Do not leave them anywhere too warm, as parts of them can melt.  Do not let your seat belt rest on your stoma. Try to  keep the seat belt either above or below your stoma, or use a tiny pillow  to cushion it.  You can still participate in sports, but you should avoid activities in which there is a risk of getting hit in the abdomen.  You can still have sex. It is a good idea to empty your pouch prior to sex. Some people and their partners feel very comfortable seeing the pouch during sex. Others choose to wear lingerie or a T-shirt that covers the device. SEEK IMMEDIATE MEDICAL CARE IF:  You notice a change in the size or color of the stoma, especially if it becomes very red, purple, black, or pale white.  You have bloody stools or bleeding from the stoma.  You have abdominal pain, nausea, vomiting, or bloating.  There is anything unusual protruding from the stoma.  You have irritation or red skin around the stoma.  No stool is passing from the stoma.  You have diarrhea (requiring more frequent than normal pouch emptying).   This information is not intended to replace advice given to you by your health care provider. Make sure you discuss any questions you have with your health care provider.   Document Released: 09/12/2003 Document Revised: 12/02/2011 Document Reviewed: 02/06/2011 Elsevier Interactive Patient Education 2016 [facility], [place] [phone]  OPEN ABDOMINAL SURGERY: POST OP INSTRUCTIONS  Always review your discharge instruction sheet given to you by the facility where your surgery was performed.  IF YOU HAVE DISABILITY OR FAMILY LEAVE FORMS, YOU MUST BRING THEM TO THE OFFICE FOR PROCESSING.  PLEASE DO NOT GIVE THEM TO YOUR DOCTOR.  1. A prescription for pain medication may be given to you upon discharge.  Take your pain medication as prescribed, if needed.  If narcotic pain medicine is not needed, then you may take acetaminophen (Tylenol) or ibuprofen (Advil) as needed. 2. Take your usually prescribed medications unless otherwise directed. 3. If you need a refill on your pain medication, please contact your pharmacy.  They will contact our office to request authorization.  Prescriptions will not be filled after 5pm or on week-ends. 4. You should follow a light diet the first few days after arrival home, such as soup and crackers, pudding, etc.unless your doctor has advised otherwise. A high-fiber, low fat diet can be resumed as tolerated.   Be sure to include lots of fluids daily. Most patients will experience some swelling and bruising on the chest and neck area.  Ice packs will help.  Swelling and bruising can take several days to resolve 5. Most patients will experience some swelling and bruising in the area of the incision. Ice pack will help. Swelling and bruising can take several days to resolve..  6. It is common to experience some constipation if taking pain medication after surgery.  Increasing fluid intake and taking a stool softener will usually help or prevent this problem from occurring.  A mild laxative (Milk of Magnesia or Miralax) should be taken according to package directions if there are no bowel movements after 48 hours. 7.  You may have steri-strips (small skin tapes) in place directly over the incision.  These strips should be left on the skin for 7-10 days.  If your surgeon used skin glue on the incision, you may shower in 24 hours.  The glue will flake off over the next 2-3 weeks.  Any sutures or staples will be removed at the office during your follow-up visit.  You may find that a light gauze bandage over your incision may keep your staples from being rubbed or pulled. You may shower and replace the bandage daily. 8. ACTIVITIES:  You may resume regular (light) daily activities beginning the next day--such as daily self-care, walking, climbing stairs--gradually increasing activities as tolerated.  You may have sexual intercourse when it is comfortable.  Refrain from any heavy lifting or straining until approved by your doctor. a. You may drive when you no longer are taking prescription pain  medication, you can comfortably wear a seatbelt, and you can safely maneuver your car and apply brakes b. Return to Work: ___________________________________ 58. You should see your doctor in the office for a follow-up appointment approximately two weeks after your surgery.  Make sure that you call for this appointment within a day or two after you arrive home to insure a convenient appointment time. OTHER INSTRUCTIONS:  _____________________________________________________________ _____________________________________________________________  WHEN TO CALL YOUR DOCTOR: 1. Fever over 101.0 2. Inability to urinate 3. Nausea and/or vomiting 4. Extreme swelling or bruising 5. Continued bleeding from incision. 6. Increased pain, redness, or drainage from the incision. 7. Difficulty swallowing or breathing 8. Muscle cramping or spasms. 9. Numbness or tingling in hands or feet or around lips.  The clinic staff is available to answer your questions during regular business hours.  Please dont hesitate to call and ask to speak to one of the nurses if you have concerns.  For further questions, please visit [email or web address]

## 2015-07-24 NOTE — Clinical Social Work Placement (Signed)
   CLINICAL SOCIAL WORK PLACEMENT  NOTE  Date:  07/24/2015  Patient Details  Name: Dominique [name] MRN: [id number] Date of Birth: Mar 23, 1981  Clinical Social Work is seeking post-discharge placement for this patient at the [place] level of care (*CSW will initial, date and re-position this form in  chart as items are completed):  Yes   Patient/family provided with [facility] Work Department's list of facilities offering this level of care within the geographic area requested by the patient (or if unable, by the patient's family).  Yes   Patient/family informed of their freedom to choose among providers that offer the needed level of care, that participate in Medicare, Medicaid or managed care program needed by the patient, have an available bed and are willing to accept the patient.  Yes   Patient/family informed of [facility]'s ownership interest in [facility] and [facility], as well as of the fact that they are under no obligation to receive care at these facilities.  PASRR submitted to EDS on 07/24/15     PASRR number received on       Existing PASRR number confirmed on       FL2 transmitted to all facilities in geographic area requested by pt/family on 07/24/15     FL2 transmitted to all facilities within larger geographic area on 07/24/15     Patient informed that his/her managed care company has contracts with or will negotiate with certain facilities, including the following:            Patient/family informed of bed offers received.  Patient chooses bed at       Physician recommends and patient chooses bed at      Patient to be transferred to   on  .  Patient to be transferred to facility by       Patient family notified on   of transfer.  Name of family member notified:        PHYSICIAN       Additional Comment:    _______________________________________________ Ross Ludwig, [place] 07/24/2015, 5: 45pm

## 2015-07-24 NOTE — Progress Notes (Signed)
Referring Physician(s): Dr Watt Climes  Chief Complaint:  Left abd abscess  Subjective:  L abd abscess drain placed 10/28 Feeling better daily Output serous in color + Ecoli Slowly eating advanced diet  Allergies: Other and Penicillins  Medications: Prior to Admission medications   Medication Sig Start Date End Date Taking? Authorizing Provider  ferrous sulfate 325 (65 FE) MG tablet Take 2 tablets (650 mg total) by mouth daily with breakfast. 04/07/15  Yes Midge Minium, MD  furosemide (LASIX) 40 MG tablet Take 1 tablet (40 mg total) by mouth 2 (two) times daily. 06/26/15  Yes Verlee Monte, MD  mesalamine (PENTASA) 250 MG CR capsule Take 2 capsules (500 mg total) by mouth 4 (four) times daily. 06/26/15  Yes Verlee Monte, MD  metoprolol succinate (TOPROL XL) 50 MG 24 hr tablet Take 1 tablet (50 mg total) by mouth daily. Take with or immediately following a meal. 06/26/15  Yes Verlee Monte, MD  ondansetron (ZOFRAN) 4 MG tablet Take 1 tablet (4 mg total) by mouth every 6 (six) hours. 05/25/15  Yes Heather Laisure, PA-C  polyethylene glycol (MIRALAX / GLYCOLAX) packet Take 17 g by mouth daily. 06/26/15  Yes Verlee Monte, MD  PROAIR HFA 108 (90 BASE) MCG/ACT inhaler INHALE 2 PUFFS BY MOUTH INTO THE LUNGS EVERY 6 HOURS AS NEEDED FOR WHEEZING OR SHORTNESS OF BREATH 02/17/15  Yes Midge Minium, MD  saccharomyces boulardii (FLORASTOR) 250 MG capsule Take 1 capsule (250 mg total) by mouth 2 (two) times daily. 06/26/15  Yes Verlee Monte, MD  albuterol (PROVENTIL) (2.5 MG/3ML) 0.083% nebulizer solution Take 3 mLs (2.5 mg total) by nebulization every 6 (six) hours as needed for wheezing or shortness of breath. Patient not taking: Reported on 06/03/2015 10/27/14   Rigoberto Noel, MD     Vital Signs: BP 102/61 mmHg  Pulse 88  Temp(Src) 97.6 F (36.4 C) (Oral)  Resp 18  Ht 5' 2"  (1.575 m)  Wt 373 lb (169.192 kg)  BMI 68.21 kg/m2  SpO2 100%  LMP 05/25/2015 (LMP Unknown)  Physical Exam    Abdominal: Soft.  LLQ abscess drain intact Serous output 20 cc yesterday 20 cc in JP Ecoli Wbc wnl afeb   Skin: Skin is warm and dry.    Imaging: Ct Abdomen Pelvis W Contrast  07/20/2015  CLINICAL DATA:  34 year old. Vomiting. Recent colon surgery. Patient unable to tolerate p.o. contrast. EXAM: CT ABDOMEN AND PELVIS WITH CONTRAST TECHNIQUE: Multidetector CT imaging of the abdomen and pelvis was performed using the standard protocol following bolus administration of intravenous contrast. CONTRAST:  181m OMNIPAQUE IOHEXOL 300 MG/ML  SOLN COMPARISON:  06/23/2015 FINDINGS: Lower chest: No pleural effusion identified. There is atelectasis noted in the left base. Hepatobiliary: Diffuse hepatic steatosis identified. No focal liver abnormality. The gallbladder is normal. No biliary dilatation. Pancreas: Negative. Spleen: Negative. Adrenals/Urinary Tract: The adrenal glands are normal. The kidneys are unremarkable. Urinary bladder is collapsed around a Foley catheter balloon. Stomach/Bowel: Normal appearance of the stomach. The proximal small bowel loops have a normal caliber. No bowel wall thickening or dilatation. There is diffuse wall thickening involving the colon. 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 Vascular/Lymphatic: Normal appearance of the abdominal aorta. No enlarged retroperitoneal or mesenteric adenopathy. No enlarged pelvic or inguinal lymph nodes. Reproductive: The uterus and the adnexal structures are unremarkable. Other: Within the left hemi abdomen there is a well-circumscribed fluid collection measuring 4.6 x 7.7 x 12.0 cm. This appears to  exert mass effect upon the adjacent left upper quadrant small bowel loops. There is surrounding fating. stranding. Several smaller fluid collections are identified within the left lower quadrant and central pelvis. The largest of these is along the fundus of the uterus measuring 5.5 x 1.6 x 2.3 cm. Diffuse  body wall edema is identified compatible with anasarca. Open ventral abdominal wall wound is identified with drain in place. Musculoskeletal: Unremarkable. IMPRESSION: 1. Examination is remarkable for several abscesses within the abdomen and pelvis. The largest is in the left hemi abdomen and has mass effect upon the small bowel loops. This has a maximum dimension of 12 cm. 2. 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 The remaining colon is diffusely edematous. 3. Hepatic steatosis. This appears progressive when compared with previous imaging. 4. No evidence to suggest small bowel obstruction. Electronically Signed   By: Kerby Moors M.D.   On: 07/20/2015 15:55   Ct Image Guided Drainage By Percutaneous Catheter  07/21/2015  CLINICAL DATA:  Colonic perforation and status post colectomy. Development of left-sided peritoneal abscess requiring percutaneous drainage. EXAM: CT GUIDED DRAINAGE OF PERITONEAL ABSCESS ANESTHESIA/SEDATION: 150 mcg IV Fentanyl Total Moderate Sedation Time:  20 minutes PROCEDURE: The procedure, risks, benefits, and alternatives were explained to the patient. Questions regarding the procedure were encouraged and answered. The patient understands and consents to the procedure. A time-out was performed prior to the procedure. The left abdominal wall was prepped with Betadine in a sterile fashion, and a sterile drape was applied covering the operative field. A sterile gown and sterile gloves were used for the procedure. Local anesthesia was provided with 1% Lidocaine. CT was performed in a supine position. Under CT guidance, an 18 gauge trocar needle was advanced into an abscess within the left lower peritoneal cavity. Aspiration of fluid was performed and a sample sent for culture analysis. A guidewire was advanced and the tract dilated. A 12 French percutaneous drainage catheter was placed. Catheter position was confirmed by CT. The catheter was flushed and connected  to a suction bulb. It was secured at the skin with a Prolene retention suture and StatLock device. COMPLICATIONS: None FINDINGS: Aspiration at the level of the left lower quadrant abscess revealed purulent fluid. After placement of the drainage catheter, there is excellent return of purulent fluid. IMPRESSION: CT-guided percutaneous catheter drainage of left lower quadrant peritoneal abscess. A sample of purulent fluid was sent for culture analysis. Output will be followed. Electronically Signed   By: Aletta Edouard M.D.   On: 07/21/2015 13:21    Labs:  CBC:  Recent Labs  07/20/15 0800 07/21/15 0440 07/22/15 0515 07/23/15 0445  WBC 10.4 10.5 10.0 9.1  HGB 7.7* 7.6* 8.0* 7.6*  HCT 25.2* 25.0* 25.4* 25.3*  PLT 413* 450* 393 383    COAGS:  Recent Labs  11/17/14 0545 06/11/15 2345 07/21/15 0440  INR 1.46 1.60* 1.57*  APTT  --   --  40*    BMP:  Recent Labs  07/21/15 0440 07/21/15 2011 07/22/15 0515 07/23/15 0445  NA 135 138 132* 131*  K 2.8* 3.4* 3.0* 3.4*  CL 95* 98* 93* 94*  CO2 32 32 32 31  GLUCOSE 82 86 90 85  BUN 15 12 12 10   CALCIUM 7.0* 7.2* 7.0* 7.0*  CREATININE 1.17* 1.07* 1.02* 0.90  GFRNONAA 60* >60 >60 >60  GFRAA >60 >60 >60 >60    LIVER FUNCTION TESTS:  Recent Labs  06/27/15 0543 07/20/15 0800 07/22/15 0515 07/23/15 0445  BILITOT 0.6 0.8 1.4* 1.1  AST 28 37 21 20  ALT 20 28 29 26   ALKPHOS 102 263* 249* 235*  PROT 4.3* 5.0* 5.1* 4.7*  ALBUMIN 1.4* 1.1* 1.2* 1.1*    Assessment and Plan:  abd abscess Hx Crohn's disease Drain in place LLQ Will follow Will need Re CT when output less than 10 cc/day If dc with drain---may follow in IR drain clinic  Signed: TURPIN,PAMELA A 07/24/2015, 10:59 AM   I spent a total of 15 Minutes at the the patient's bedside AND on the patient's hospital floor or unit, greater than 50% of which was counseling/coordinating care for abscess drain

## 2015-07-24 NOTE — Clinical Social Work Note (Signed)
Clinical Social Work Assessment  Patient Details  Name: Dominique [name] MRN: [id number] Date of Birth: 03-Nov-1980  Date of referral:  07/24/15               Reason for consult:  Facility Placement                Permission sought to share information with:  Facility Art therapist granted to share information::  Yes, Verbal Permission Granted  Name::        Agency::  SNF admissions  Relationship::     Contact Information:     Housing/Transportation Living arrangements for the past 2 months:  Single Family Home Source of Information:  Patient Patient Interpreter Needed:  None Criminal Activity/Legal Involvement Pertinent to Current Situation/Hospitalization:  No - Comment as needed Significant Relationships:  Dependent Children Lives with:  Minor Children Do you feel safe going back to the place where you live?  Yes (Patient states once she has received some PT at a SNF then she can return back home.) Need for family participation in patient care:  No (Coment)  Care giving concerns:  Patient expressed she feels she needs short term rehab at a SNF in order to return back home.  Social Worker assessment / plan: Patient is a pleasant 34 year old female who lives with her child.  Patient was talkative and friendly and is aware that [facility] is not going to take her back.  Patient expressed that she is aware that she will have to go to a SNF now for short term rehab.  Patient expressed that she has never been before, but is aware that she needs to in order to return back home.  Patient expressed she has a child who lives with her, but since she has been in the hospital she has had someone help take care of her child.  Patient expressed a desire to go to a SNF somewhere near where she lives, patient was informed that since she only has Medicaid, it may be difficult to find placement for her, but she is willing to go wherever she needs to get short term rehab and the get better.  Patient  expressed that she has tried applying for Medicare and disability and is waiting for results.  Patient expressed frustration at how long it takes to see if she is approved for Medicare or SSI   Employment status:  Disabled (Comment on whether or not currently receiving Disability) (Patient is not currently receiving disability, she states she has applied.) Insurance information:  Medicaid In [place] PT Recommendations:   SNF for short term rehab if she can not return to [facility]. Information / Referral to community resources:  [place]  Patient/Family's Response to care:  Patient is in agreement to going to SNF and gave permission for CSW to begin search process  Patient/Family's Understanding of and Emotional Response to Diagnosis, Current Treatment, and Prognosis:  Patient expressed frustration that it takes awhile for someone to get approved for Medicare and disability.  Patient is a ware of current treatment plan and prognosis.  Emotional Assessment Appearance:  Appears stated age Attitude/Demeanor/Rapport:    Affect (typically observed):  Calm, Hopeful, Stable Orientation:  Oriented to Self, Oriented to Place, Oriented to  Time, Oriented to Situation Alcohol / Substance use:  Not Applicable Psych involvement (Current and /or in the community):  No (Comment)  Discharge Needs  Concerns to be addressed:    Readmission within the last 30 days:  Current discharge risk:  Lack of support system, Physical Impairment Barriers to Discharge:  No SNF bed, Inadequate or no insurance   Ross Ludwig, [place] 07/24/2015, 5:40 pm

## 2015-07-24 NOTE — Progress Notes (Signed)
Patient refusing CPAP tonight. States she is wearing oxygen at night and has been doing fine with it. RT made patient aware that if she changed her mind to call. RT will continue to monitor.

## 2015-07-24 NOTE — Progress Notes (Signed)
Patient ID: Dominique [name], female   DOB: [birth year], 34 y.o.   MRN: [id number]    Subjective: Pt feels well today.  Pain in her abdomen continues to be less  Objective: Vital signs in last 24 hours: Temp:  [97.6 F (36.4 C)-98.3 F (36.8 C)] 97.6 F (36.4 C) (10/31 0516) Pulse Rate:  [88-102] 88 (10/31 0516) Resp:  [18] 18 (10/31 0516) BP: (102-104)/(44-61) 102/61 mmHg (10/31 0516) SpO2:  [99 %-100 %] 100 % (10/31 0516) Weight:  [169.192 kg (373 lb)] 169.192 kg (373 lb) (10/31 0203) Last BM Date: 07/24/15  Intake/Output from previous day: 10/30 0701 - 10/31 0700 In: 45 [P.O.:462; IV Piggyback:150] Out: 969 [Urine:850; Drains:18; Stool:100; Blood:1] Intake/Output this shift: Total I/O In: 420 [P.O.:420] Out: -   PE: Abd: soft, less tender, mildly tender around her colostomy, colostomy with good output, wound VAC in place and changed this morning by WOC, RN.  Drain is more serous in nature today and thin  Lab Results:   Recent Labs  07/22/15 0515 07/23/15 0445  WBC 10.0 9.1  HGB 8.0* 7.6*  HCT 25.4* 25.3*  PLT 393 383   BMET  Recent Labs  07/22/15 0515 07/23/15 0445  NA 132* 131*  K 3.0* 3.4*  CL 93* 94*  CO2 32 31  GLUCOSE 90 85  BUN 12 10  CREATININE 1.02* 0.90  CALCIUM 7.0* 7.0*   PT/INR No results for input(s): LABPROT, INR in the last 72 hours. CMP     Component Value Date/Time   NA 131* 07/23/2015 0445   K 3.4* 07/23/2015 0445   CL 94* 07/23/2015 0445   CO2 31 07/23/2015 0445   GLUCOSE 85 07/23/2015 0445   BUN 10 07/23/2015 0445   CREATININE 0.90 07/23/2015 0445   CREATININE 0.52 12/29/2014 1557   CALCIUM 7.0* 07/23/2015 0445   PROT 4.7* 07/23/2015 0445   ALBUMIN 1.1* 07/23/2015 0445   AST 20 07/23/2015 0445   ALT 26 07/23/2015 0445   ALKPHOS 235* 07/23/2015 0445   BILITOT 1.1 07/23/2015 0445   GFRNONAA >60 07/23/2015 0445   GFRAA >60 07/23/2015 0445   Lipase     Component Value Date/Time   LIPASE 10* 06/09/2015 1325        Studies/Results: No results found.  Anti-infectives: Anti-infectives    Start     Dose/Rate Route Frequency Ordered Stop   07/23/15 1400  piperacillin-tazobactam (ZOSYN) IVPB 3.375 g  Status:  Discontinued     3.375 g 12.5 mL/hr over 240 Minutes Intravenous 3 times per day 07/23/15 1225 07/23/15 1232   07/23/15 1400  cefTRIAXone (ROCEPHIN) 2 g in dextrose 5 % 50 mL IVPB     2 g 100 mL/hr over 30 Minutes Intravenous Every 24 hours 07/23/15 1237     07/23/15 1400  metroNIDAZOLE (FLAGYL) IVPB 500 mg     500 mg 100 mL/hr over 60 Minutes Intravenous Every 8 hours 07/23/15 1237     07/23/15 1230  cefTRIAXone (ROCEPHIN) 1 g in dextrose 5 % 50 mL IVPB  Status:  Discontinued     1 g 100 mL/hr over 30 Minutes Intravenous Every 24 hours 07/23/15 1215 07/23/15 1216   07/21/15 1245  ciprofloxacin (CIPRO) IVPB 400 mg  Status:  Discontinued     400 mg 200 mL/hr over 60 Minutes Intravenous Every 12 hours 07/21/15 1233 07/23/15 1216   07/21/15 1245  metroNIDAZOLE (FLAGYL) IVPB 500 mg  Status:  Discontinued     500 mg 100 mL/hr over 60 Minutes  Intravenous Every 8 hours 07/21/15 1233 07/23/15 1216       Assessment/Plan  Perforated descending colon, colitis POD 41,43 s/p ex lap with DRAINAGE OF INTRA-ABDOMINAL ABSCESS, PARTIAL COLON RESECTION, re-exploration and creation of colostomy and abdominal closure---Dr. Lanier Clam - wound is clean. Cont wound VAC -stoma--mucocutaneous separation, pink and viable -miralax -up with therapies -abd binder  -perc drain placed last Friday for intra-abdominal abscess.  Patient tolerating a solid diet and having minimal pain.  She is surgically stable for dc to SNF when medically stable.  She can follow up with IR drain clinic for repeat CT scan and drain evaluation and she can follow up with Dr. Grandville Silos in our office in 2-3 weeks for follow up.  LOS: 3 days    Dominique [name] E 07/24/2015, 11:20 AM Pager: [phone]

## 2015-07-24 NOTE — Consult Note (Signed)
WOC follow-up: Ostomy pouching: Ostomy pouch changed today related to leakage behind barrier.Applied 2 piece pouch with barrier ring. Stoma is 100% red and moist, slightly above skin level, 1 1/2 inches, bleeding slightly, mucutaneous separation occurring to 50% of peristomal edges, from 6:00 o'clock to 12:00 o'clock with mod amt stool in the peristomal wound, granulating tissue underneath when stool is absorbed. Mod amt amt semi-formed brown stool in pouch. Education provided: Demonstrated pouch change using 2 piece pouch and a barrier ring to attempt to maintain seal. Applied strip of Aquacel packing into separation edges of stoma. Enrolled patient in [place] program: No Ostomy supplies and instructions at the bedside for nurse use. Pt watched procedure and asked appropriate questions. She will need total assistance with pouching upon discharge until this complex problem is resolved.  Wound type: Full thickness midline abd wound, CCS following for assessment and plan of care, changed Vac dressing, mod amt reddish-tan drainage in the cannister, no odor. Full thickness post-op wound 100% red.  Applied Mepitel contact layer, then one piece of white foam to lower inner wound, then one piece of black foam to 115m cont suction.Pt tolerated with mod amt discomfort. Plan to change dressing on M/W/F at 0800.  DJulien GirtMSN, RN, CWOCN, CWCN-AP, CNS

## 2015-07-24 NOTE — Evaluation (Signed)
Occupational Therapy Evaluation Patient Details Name: Dominique [name] MRN: [id number] DOB: [birth year] Today's Date: 07/24/2015    History of Present Illness Adm 9/10 for colitis 9/16 detected perforated descending colon >>laparotomy, drain of abscess, partial colectomy, wound vac; remained on vent post op 9/18 To OR >> wound closed, colostomy created 9/21 Extubated, went to CIR 9/26-10/28.  She returned to acute level of care for further medical management.   Clinical Impression   Pt admitted to hospital due to reason stated above. Pt currently with functional limitiations due to the deficits listed below (see OT problem list). Pt currently requires set up to total assistance with ADLs. Pt will benefit from skilled OT to increase her independence and safety with ADLs and balance to allow discharge to venue listed below.  Recommend SNF have bariatric bed, lift and 3-in-1 BSC to accommodate pt.    Follow Up Recommendations  SNF    Equipment Recommendations  Other (comment) (TBD next venue)    Recommendations for Other Services       Precautions / Restrictions Precautions Precautions: Fall Precaution Comments: abdominal wound vac Restrictions Weight Bearing Restrictions: No      Mobility Bed Mobility               General bed mobility comments: Pt decline bed mobility and repositioning  Transfers                 General transfer comment: pt declined getting out of bed    Balance                                            ADL Overall ADL's : Needs assistance/impaired Eating/Feeding: Independent;Bed level   Grooming: Wash/dry face;Oral care;Set up;Bed level   Upper Body Bathing: Moderate assistance;Sitting   Lower Body Bathing: Total assistance;Sitting/lateral leans   Upper Body Dressing : Moderate assistance;Sitting   Lower Body Dressing: Total assistance;Sitting/lateral leans;Sit to/from Health and safety inspector Details (indicate  cue type and reason): uses bedpan                 Vision     Perception     Praxis      Pertinent Vitals/Pain Pain Assessment: 0-10 Pain Score: 5  Pain Location: abdomen  Pain Descriptors / Indicators: Aching;Sore Pain Intervention(s): Monitored during session;Premedicated before session     Hand Dominance Right   Extremity/Trunk Assessment Upper Extremity Assessment Upper Extremity Assessment: Overall WFL for tasks assessed   Lower Extremity Assessment Lower Extremity Assessment: Defer to PT evaluation       Communication Communication Communication: No difficulties   Cognition Arousal/Alertness: Awake/alert Behavior During Therapy: WFL for tasks assessed/performed Overall Cognitive Status: Within Functional Limits for tasks assessed                     General Comments       Exercises       Shoulder Instructions      Home Living Family/patient expects to be discharged to:: Private residence Living Arrangements: Children Available Help at Discharge: Family;Available 24 hours/day Type of Home: Apartment Home Access: Stairs to enter Entrance Stairs-Number of Steps: 8 Entrance Stairs-Rails: Right;Left;Can reach both Home Layout: One level     Bathroom Shower/Tub: Teacher, early years/pre: Standard Bathroom Accessibility: No   Home Equipment: None      Lives  With: Daughter    Prior Functioning/Environment Level of Independence: Independent        Comments: Has a 51 year old daughter    OT Diagnosis: Generalized weakness;Acute pain   OT Problem List: Decreased strength;Decreased activity tolerance;Impaired balance (sitting and/or standing);Decreased knowledge of use of DME or AE;Obesity;Impaired UE functional use;Pain   OT Treatment/Interventions: Self-care/ADL training;Therapeutic exercise;Therapeutic activities;Balance training;Patient/family education;DME and/or AE instruction    OT Goals(Current goals can be found  in the care plan section) Acute Rehab OT Goals Patient Stated Goal: to go home and see daughter OT Goal Formulation: With patient Time For Goal Achievement: 08/07/15 Potential to Achieve Goals: Good ADL Goals Pt Will Perform Grooming: with set-up;sitting (EOB) Pt Will Perform Upper Body Bathing: with min assist;sitting Pt Will Perform Upper Body Dressing: with min assist;sitting Additional ADL Goal #1: Pt will demonstrate energy conservation techniques 75% of time during therapy session  OT Frequency: Min 3X/week   Barriers to D/C:            Co-evaluation              End of Session Equipment Utilized During Treatment: Oxygen  Activity Tolerance: Patient tolerated treatment well Patient left: in bed;with call bell/phone within reach   Time: 2263-3354 OT Time Calculation (min): 23 min Charges:  OT General Charges $OT Visit: 1 Procedure OT Evaluation $Initial OT Evaluation Tier I: 1 Procedure G-Codes:    Lin Landsman 2015/08/06, 12:16 PM

## 2015-07-25 LAB — URINE CULTURE: Culture: 20000

## 2015-07-25 LAB — COMPREHENSIVE METABOLIC PANEL WITH GFR
ALT: 25 U/L (ref 14–54)
AST: 20 U/L (ref 15–41)
Albumin: 1.2 g/dL — ABNORMAL LOW (ref 3.5–5.0)
Alkaline Phosphatase: 203 U/L — ABNORMAL HIGH (ref 38–126)
Anion gap: 3 — ABNORMAL LOW (ref 5–15)
BUN: 7 mg/dL (ref 6–20)
CO2: 31 mmol/L (ref 22–32)
Calcium: 7.6 mg/dL — ABNORMAL LOW (ref 8.9–10.3)
Chloride: 103 mmol/L (ref 101–111)
Creatinine, Ser: 0.87 mg/dL (ref 0.44–1.00)
GFR calc Af Amer: >60 mL/min (ref >60)
GFR calc non Af Amer: >60 mL/min (ref >60)
Glucose, Bld: 93 mg/dL (ref 65–99)
Potassium: 4 mmol/L (ref 3.5–5.1)
Sodium: 137 mmol/L (ref 135–145)
Total Bilirubin: 0.9 mg/dL (ref 0.3–1.2)
Total Protein: 4.8 g/dL — ABNORMAL LOW (ref 6.5–8.1)

## 2015-07-25 MED ORDER — OXYCODONE HCL 5 MG PO TABS: 5.0000 mg | ORAL_TABLET | ORAL | Status: DC | PRN

## 2015-07-25 MED ORDER — ESCITALOPRAM OXALATE 10 MG PO TABS: 10.0000 mg | ORAL_TABLET | Freq: Every day | ORAL | Status: DC

## 2015-07-25 MED ORDER — MESALAMINE ER 250 MG PO CPCR: 500.0000 mg | ORAL_CAPSULE | Freq: Four times a day (QID) | ORAL | Status: DC

## 2015-07-25 MED ORDER — METHYLPHENIDATE HCL 5 MG PO TABS: 5.0000 mg | ORAL_TABLET | Freq: Two times a day (BID) | ORAL | Status: DC

## 2015-07-25 MED ORDER — CEPHALEXIN 500 MG PO CAPS: 500.0000 mg | ORAL_CAPSULE | Freq: Four times a day (QID) | ORAL | Status: DC

## 2015-07-25 MED ORDER — DOCUSATE SODIUM 100 MG PO CAPS: 100.0000 mg | ORAL_CAPSULE | Freq: Two times a day (BID) | ORAL | Status: DC

## 2015-07-25 MED ORDER — HEPARIN SOD (PORK) LOCK FLUSH 100 UNIT/ML IV SOLN
250.0000 [IU] | INTRAVENOUS | Status: AC | PRN
Start: 2015-07-25 — End: 2015-07-25
  Administered 2015-07-25: 250 [IU]

## 2015-07-25 MED ORDER — POTASSIUM CHLORIDE CRYS ER 20 MEQ PO TBCR: 40.0000 meq | EXTENDED_RELEASE_TABLET | Freq: Every day | ORAL | Status: DC

## 2015-07-25 MED ORDER — ENSURE ENLIVE PO LIQD: 237.0000 mL | Freq: Three times a day (TID) | ORAL | Status: DC

## 2015-07-25 MED ORDER — SIMETHICONE 80 MG PO CHEW: 80.0000 mg | CHEWABLE_TABLET | Freq: Four times a day (QID) | ORAL | Status: DC | PRN

## 2015-07-25 MED ORDER — METRONIDAZOLE 500 MG PO TABS: 500.0000 mg | ORAL_TABLET | Freq: Three times a day (TID) | ORAL | Status: DC

## 2015-07-25 MED ORDER — PANTOPRAZOLE SODIUM 40 MG PO TBEC: 40.0000 mg | DELAYED_RELEASE_TABLET | Freq: Every day | ORAL | Status: DC

## 2015-07-25 MED ORDER — METHOCARBAMOL 500 MG PO TABS: 500.0000 mg | ORAL_TABLET | Freq: Four times a day (QID) | ORAL | Status: DC | PRN

## 2015-07-25 MED ORDER — SODIUM CHLORIDE 0.9 % IJ SOLN
10.0000 mL | INTRAMUSCULAR | Status: DC | PRN
  Administered 2015-07-25: 10 mL

## 2015-07-25 MED ORDER — FOLIC ACID 1 MG PO TABS: 1.0000 mg | ORAL_TABLET | Freq: Every day | ORAL | Status: DC

## 2015-07-25 NOTE — Care Management Note (Signed)
Case Management Note  Patient Details  Name: Karrine Kluttz MRN: [id number] Date of Birth: 06-Dec-1980  Subjective/Objective:     Patient is for dc to [facility] SNF today.  Awaiting wound vac to transport patient to facility.                 Action/Plan:   Expected Discharge Date:  07/26/15               Expected Discharge Plan:  Skilled Nursing Facility  In-House Referral:  Clinical Social Work  Discharge planning Services  CM Consult  Post Acute Care Choice:    Choice offered to:     DME Arranged:    DME Agency:     HH Arranged:    [place] Agency:     Status of Service:  Completed, signed off  Medicare Important Message Given:    Date Medicare IM Given:    Medicare IM give by:    Date Additional Medicare IM Given:    Additional Medicare Important Message give by:     If discussed at [place] of Stay Meetings, dates discussed:    Additional Comments:  Zenon Mayo, RN 07/25/2015, 2:28 PM

## 2015-07-25 NOTE — Clinical Social Work Placement (Signed)
   CLINICAL SOCIAL WORK PLACEMENT  NOTE  Date:  07/25/2015  Patient Details  Name: Dominique [name] MRN: [id number] Date of Birth: July 08, 1981  Clinical Social Work is seeking post-discharge placement for this patient at the [place] level of care (*CSW will initial, date and re-position this form in  chart as items are completed):  Yes   Patient/family provided with [place] Work Department's list of facilities offering this level of care within the geographic area requested by the patient (or if unable, by the patient's family).  Yes   Patient/family informed of their freedom to choose among providers that offer the needed level of care, that participate in Medicare, Medicaid or managed care program needed by the patient, have an available bed and are willing to accept the patient.  Yes   Patient/family informed of 's ownership interest in [facility] and [facility], as well as of the fact that they are under no obligation to receive care at these facilities.  PASRR submitted to EDS on 07/24/15     PASRR number received on       Existing PASRR number confirmed on       FL2 transmitted to all facilities in geographic area requested by pt/family on 07/24/15     FL2 transmitted to all facilities within larger geographic area on 07/24/15     Patient informed that his/her managed care company has contracts with or will negotiate with certain facilities, including the following:        Yes   Patient/family informed of bed offers received.  Patient chooses bed at  [facility])     Physician recommends and patient chooses bed at  [facility])    Patient to be transferred to  [facility]) on 07/25/15.  Patient to be transferred to facility by  [facility])     Patient family notified on 07/25/15 of transfer.  Name of family member notified:   (mother, Dominique [name])     PHYSICIAN       Additional Comment:     _______________________________________________ Leane Call, Student-SW 07/25/2015, 12:02 PM

## 2015-07-25 NOTE — Progress Notes (Signed)
  Subjective: Pain controlled, able to eat overnight without vomiting, still not normal appetitie, doing bed exercises  Objective: Vital signs in last 24 hours: Temp:  [97.5 F (36.4 C)-98.3 F (36.8 C)] 97.5 F (36.4 C) (11/01 0647) Pulse Rate:  [92-127] 92 (11/01 0647) Resp:  [16-20] 18 (11/01 0647) BP: (95-119)/(45-70) 119/70 mmHg (11/01 0647) SpO2:  [99 %-100 %] 100 % (11/01 0647) Weight:  [173.728 kg (383 lb)] 173.728 kg (383 lb) (11/01 0647) Last BM Date: 07/24/15  Intake/Output from previous day: 10/31 0701 - 11/01 0700 In: 1323.8 [P.O.:770; I.V.:553.8] Out: 1000 [Urine:550; Drains:440; Stool:10] Intake/Output this shift: Total I/O In: 30 [P.O.:30] Out: -   General appearance: alert and cooperative Head: Normocephalic, without obvious abnormality, atraumatic GI: soft, non-tender; bowel sounds normal; no masses,  no organomegaly and vac in place without surrounding induration, ostomy with liquid output  Lab Results:   Recent Labs  07/23/15 0445  WBC 9.1  HGB 7.6*  HCT 25.3*  PLT 383   BMET  Recent Labs  07/23/15 0445 07/25/15 0420  NA 131* 137  K 3.4* 4.0  CL 94* 103  CO2 31 31  GLUCOSE 85 93  BUN 10 7  CREATININE 0.90 0.87  CALCIUM 7.0* 7.6*   PT/INR No results for input(s): LABPROT, INR in the last 72 hours. ABG No results for input(s): PHART, HCO3 in the last 72 hours.  Invalid input(s): PCO2, PO2  Studies/Results: No results found.  Anti-infectives: Anti-infectives    Start     Dose/Rate Route Frequency Ordered Stop   07/23/15 1400  piperacillin-tazobactam (ZOSYN) IVPB 3.375 g  Status:  Discontinued     3.375 g 12.5 mL/hr over 240 Minutes Intravenous 3 times per day 07/23/15 1225 07/23/15 1232   07/23/15 1400  cefTRIAXone (ROCEPHIN) 2 g in dextrose 5 % 50 mL IVPB     2 g 100 mL/hr over 30 Minutes Intravenous Every 24 hours 07/23/15 1237     07/23/15 1400  metroNIDAZOLE (FLAGYL) IVPB 500 mg     500 mg 100 mL/hr over 60 Minutes  Intravenous Every 8 hours 07/23/15 1237     07/23/15 1230  cefTRIAXone (ROCEPHIN) 1 g in dextrose 5 % 50 mL IVPB  Status:  Discontinued     1 g 100 mL/hr over 30 Minutes Intravenous Every 24 hours 07/23/15 1215 07/23/15 1216   07/21/15 1245  ciprofloxacin (CIPRO) IVPB 400 mg  Status:  Discontinued     400 mg 200 mL/hr over 60 Minutes Intravenous Every 12 hours 07/21/15 1233 07/23/15 1216   07/21/15 1245  metroNIDAZOLE (FLAGYL) IVPB 500 mg  Status:  Discontinued     500 mg 100 mL/hr over 60 Minutes Intravenous Every 8 hours 07/21/15 1233 07/23/15 1216      Assessment/Plan: Morbid obesity Crohn's disease S/p hartman's colectomy with ostomy complication -continue diet with increased protein -continue PT/OT -dispo pending placement  LOS: 4 days    Arta Bruce Othelia Riederer 07/25/2015

## 2015-07-25 NOTE — Progress Notes (Signed)
Patient will be discharge to nursing home [facility]) by MD order; discharged instructions review and sent to facility with care notes and prescriptions; patient is going to facility with PICC-line, Foley and wound vac in place per MD order; patient will be transported to facility via [place].

## 2015-07-25 NOTE — Clinical Social Work Note (Signed)
Patient has accepted bed at [facility].Patient to discharge today. BSW intern has contacted patient mother, Mardene Celeste upon patient request to update her on patient discharge plan.  BSW intern to arrange transportation via [place] intern is signing off. If any further Social Work needs arises, please re-consult.  [employer] Intern [phone]

## 2015-07-25 NOTE — Progress Notes (Signed)
NCM  Contacted Dominique [name] for transport wound vac,  They will bring by 3 or 3:30 today, patient will be going to [facility].

## 2015-07-25 NOTE — NC FL2 (Signed)
[place] LEVEL OF CARE SCREENING TOOL     IDENTIFICATION  Patient Name: Dominique [name] Birthdate: 1981-06-11 Sex: female Admission Date (Current Location): 07/21/2015  [place] and [place] Number: Kathleen Argue [id number] [place] and Address:  [facility], [place] [address], [place], [place] [address]      Provider Number: [id number]  Attending Physician Name and Address:  Reyne Dumas, MD  Relative Name and Phone Number:       Current Level of Care: Hospital Recommended Level of Care: [place] Prior Approval Number:    Date Approved/Denied:   PASRR Number:    Discharge Plan: SNF    Current Diagnoses: Patient Active Problem List   Diagnosis Date Noted  . Hypokalemia 07/21/2015  . Postoperative intra-abdominal abscess ([place])   . Adjustment disorder with mixed anxiety and depressed mood   . Physical deconditioning 06/26/2015  . Severe left ventricular systolic dysfunction 41/96/2229  . Pulmonary hypertension ([place])   . Crohn's disease ([place])   . Perforated sigmoid colon ([place])   . Peritonitis ([place])   . Peritoneal abscess ([place])   . OSA on CPAP   . Obesity hypoventilation syndrome ([place])   . Pressure ulcer 06/16/2015  . Protein-calorie malnutrition, severe ([place]) 06/10/2015  . Gastritis 04/07/2015  . Iron deficiency anemia 04/07/2015  . Sinus tachycardia ([place]) 12/29/2014  . Lumbar back pain 12/06/2014  . Rectal bleeding 11/19/2014  . Diverticulitis of colon 11/17/2014  . Hepatic steatosis 11/17/2014  . Seasonal allergies 06/13/2014  . OSA (obstructive sleep apnea) 05/02/2014  . Thyromegaly 05/02/2014  . AMENORRHEA 02/07/2010  . Morbid obesity- BMI 67.5 03/22/2009    Orientation ACTIVITIES/SOCIAL BLADDER RESPIRATION    Self, Time, Situation, Place    Continent O2 (As needed) (Patient is on 2L per minute for oxygen)  BEHAVIORAL SYMPTOMS/MOOD NEUROLOGICAL BOWEL NUTRITION STATUS      Continent Diet (Carb Modified)   PHYSICIAN VISITS COMMUNICATION OF NEEDS Height & Weight Skin    Verbally 5' 2"  (157.5 cm) 373 lbs.            AMBULATORY STATUS RESPIRATION    Assist extensive O2 (As needed) (Patient is on 2L per minute for oxygen)      Personal Care Assistance Level of Assistance  Dressing, Bathing Bathing Assistance: Limited assistance   Dressing Assistance: Limited assistance      Functional Limitations Info                SPECIAL CARE FACTORS FREQUENCY                      Additional Factors Info  Code Status, Allergies Code Status Info: Full Code Allergies Info: OTHER, PENICILLINS           Current Medications (07/25/2015): Current Facility-Administered Medications  Medication Dose Route Frequency Provider Last Rate Last Dose  . acetaminophen (TYLENOL) tablet 650 mg  650 mg Oral Q6H PRN Norval Morton, MD       Or  . acetaminophen (TYLENOL) suppository 650 mg  650 mg Rectal Q6H PRN Rondell A Tamala Julian, MD      . albuterol (PROVENTIL) (2.5 MG/3ML) 0.083% nebulizer solution 2.5 mg  2.5 mg Inhalation Q4H PRN Norval Morton, MD      . alum & mag hydroxide-simeth (MAALOX/MYLANTA) 200-200-20 MG/5ML suspension 30 mL  30 mL Oral Q6H PRN Rondell A Tamala Julian, MD      . cefTRIAXone (ROCEPHIN) 2 g in dextrose 5 % 50 mL IVPB  2 g Intravenous Q24H Reyne Dumas, MD   2 g at 07/24/15 1509  . docusate sodium (COLACE) capsule 100 mg  100 mg Oral BID Samella Parr, NP   100 mg at 07/23/15 1121  . escitalopram (LEXAPRO) tablet 10 mg  10 mg Oral Daily Norval Morton, MD   10 mg at 07/24/15 0947  . feeding supplement (ENSURE ENLIVE) (ENSURE ENLIVE) liquid 237 mL  237 mL Oral TID BM Jenifer A Williams, RD   237 mL at 07/24/15 1505  . folic acid (FOLVITE) tablet 1 mg  1 mg Oral Daily Reyne Dumas, MD   1 mg at 07/24/15 0947  . heparin injection 5,000 Units  5,000 Units Subcutaneous 3 times per day Norval Morton, MD   5,000 Units at 07/24/15 2200  . magnesium citrate solution 1 Bottle  1 Bottle  Oral Once PRN Norval Morton, MD      . mesalamine (PENTASA) CR capsule 500 mg  500 mg Oral QID Samella Parr, NP   500 mg at 07/24/15 0947  . methocarbamol (ROBAXIN) tablet 500 mg  500 mg Oral Q6H PRN Norval Morton, MD      . methylphenidate (RITALIN) tablet 5 mg  5 mg Oral BID WC Reyne Dumas, MD   5 mg at 07/24/15 1504  . metoprolol succinate (TOPROL-XL) 24 hr tablet 50 mg  50 mg Oral Daily Norval Morton, MD   50 mg at 07/24/15 0947  . metroNIDAZOLE (FLAGYL) IVPB 500 mg  500 mg Intravenous Q8H Reyne Dumas, MD   500 mg at 07/24/15 2141  . morphine 2 MG/ML injection 1-2 mg  1-2 mg Intravenous Q4H PRN Norval Morton, MD   2 mg at 07/24/15 2141  . multivitamin with minerals tablet 1 tablet  1 tablet Oral Daily Domenick Bookbinder, RD   1 tablet at 07/24/15 0947  . ondansetron (ZOFRAN) tablet 4 mg  4 mg Oral Q6H PRN Norval Morton, MD       Or  . ondansetron (ZOFRAN) injection 4 mg  4 mg Intravenous Q6H PRN Norval Morton, MD   4 mg at 07/22/15 0943  . oxyCODONE (Oxy IR/ROXICODONE) immediate release tablet 5 mg  5 mg Oral Q4H PRN Norval Morton, MD   5 mg at 07/24/15 1604  . pantoprazole (PROTONIX) EC tablet 40 mg  40 mg Oral Daily Samella Parr, NP   40 mg at 07/24/15 0947  . polyethylene glycol (MIRALAX / GLYCOLAX) packet 17 g  17 g Oral Daily Norval Morton, MD   17 g at 07/23/15 1122  . polyethylene glycol (MIRALAX / GLYCOLAX) packet 17 g  17 g Oral Daily PRN Rondell A Tamala Julian, MD      . potassium chloride SA (K-DUR,KLOR-CON) CR tablet 40 mEq  40 mEq Oral BID Reyne Dumas, MD   40 mEq at 07/24/15 0947  . saccharomyces boulardii (FLORASTOR) capsule 250 mg  250 mg Oral BID Samella Parr, NP   250 mg at 07/24/15 1504  . simethicone (MYLICON) chewable tablet 80 mg  80 mg Oral Q6H PRN Rondell A Tamala Julian, MD      . sodium chloride 0.9 % 1,000 mL with potassium chloride 60 mEq infusion   Intravenous Continuous Reyne Dumas, MD 75 mL/hr at 07/24/15 1051    . sorbitol 70 % solution 30 mL  30  mL Oral Daily PRN Norval Morton, MD       Do not use  this list as official medication orders. Please verify with discharge summary.  Discharge Medications:   Medication List    ASK your doctor about these medications        albuterol (2.5 MG/3ML) 0.083% nebulizer solution  Commonly known as:  PROVENTIL  Take 3 mLs (2.5 mg total) by nebulization every 6 (six) hours as needed for wheezing or shortness of breath.     PROAIR HFA 108 (90 BASE) MCG/ACT inhaler  Generic drug:  albuterol  INHALE 2 PUFFS BY MOUTH INTO THE LUNGS EVERY 6 HOURS AS NEEDED FOR WHEEZING OR SHORTNESS OF BREATH     ferrous sulfate 325 (65 FE) MG tablet  Take 2 tablets (650 mg total) by mouth daily with breakfast.     furosemide 40 MG tablet  Commonly known as:  LASIX  Take 1 tablet (40 mg total) by mouth 2 (two) times daily.     mesalamine 250 MG CR capsule  Commonly known as:  PENTASA  Take 2 capsules (500 mg total) by mouth 4 (four) times daily.     metoprolol succinate 50 MG 24 hr tablet  Commonly known as:  TOPROL XL  Take 1 tablet (50 mg total) by mouth daily. Take with or immediately following a meal.     ondansetron 4 MG tablet  Commonly known as:  ZOFRAN  Take 1 tablet (4 mg total) by mouth every 6 (six) hours.     polyethylene glycol packet  Commonly known as:  MIRALAX / GLYCOLAX  Take 17 g by mouth daily.     saccharomyces boulardii 250 MG capsule  Commonly known as:  FLORASTOR  Take 1 capsule (250 mg total) by mouth 2 (two) times daily.        Relevant Imaging Results:  Relevant Lab Results:  Recent Labs    Additional Information Patient has a wound vac and ostomy bag.  Mallika Sanmiguel, Jones Broom, LCSWA

## 2015-07-25 NOTE — Discharge Summary (Signed)
Physician Discharge Summary  Rainy Rothman MRN: [id number] DOB/AGE: [birth year] 35 y.o.  PCP: Benito Mccreedy, MD   Admit date: 07/21/2015 Discharge date: 07/25/2015  Discharge Diagnoses:     Principal Problem:   Postoperative intra-abdominal abscess [facility]) Active Problems:   Morbid obesity- BMI 67.5   OSA (obstructive sleep apnea)   Hepatic steatosis   Sinus tachycardia (HCC)   Iron deficiency anemia   Protein-calorie malnutrition, severe (HCC)   Crohn's disease (HCC)   Perforated sigmoid colon (HCC)   OSA on CPAP   Obesity hypoventilation syndrome (HCC)   Pulmonary hypertension (HCC)   Severe left ventricular systolic dysfunction   Physical deconditioning   Adjustment disorder with mixed anxiety and depressed mood   Hypokalemia    Follow-up recommendations Follow-up with PCP in 3-5 days , including all  additional recommended appointments as below Follow-up CBC, CMP in 3-5 days follow up with Dr. Grandville Silos in our office in 2-3 weeks for follow up. follow up with IR drain clinic for repeat CT scan and drain evaluation     Medication List    STOP taking these medications        furosemide 40 MG tablet  Commonly known as:  LASIX      TAKE these medications        albuterol (2.5 MG/3ML) 0.083% nebulizer solution  Commonly known as:  PROVENTIL  Take 3 mLs (2.5 mg total) by nebulization every 6 (six) hours as needed for wheezing or shortness of breath.     PROAIR HFA 108 (90 BASE) MCG/ACT inhaler  Generic drug:  albuterol  INHALE 2 PUFFS BY MOUTH INTO THE LUNGS EVERY 6 HOURS AS NEEDED FOR WHEEZING OR SHORTNESS OF BREATH     cephALEXin 500 MG capsule  Commonly known as:  KEFLEX  Take 1 capsule (500 mg total) by mouth 4 (four) times daily.     docusate sodium 100 MG capsule  Commonly known as:  COLACE  Take 1 capsule (100 mg total) by mouth 2 (two) times daily.     escitalopram 10 MG tablet  Commonly known as:  LEXAPRO  Take 1 tablet (10 mg total) by mouth  daily.     feeding supplement (ENSURE ENLIVE) Liqd  Take 237 mLs by mouth 3 (three) times daily between meals.     ferrous sulfate 325 (65 FE) MG tablet  Take 2 tablets (650 mg total) by mouth daily with breakfast.     folic acid 1 MG tablet  Commonly known as:  FOLVITE  Take 1 tablet (1 mg total) by mouth daily.     mesalamine 250 MG CR capsule  Commonly known as:  PENTASA  Take 2 capsules (500 mg total) by mouth 4 (four) times daily.     methocarbamol 500 MG tablet  Commonly known as:  ROBAXIN  Take 1 tablet (500 mg total) by mouth every 6 (six) hours as needed for muscle spasms.     methylphenidate 5 MG tablet  Commonly known as:  RITALIN  Take 1 tablet (5 mg total) by mouth 2 (two) times daily with breakfast and lunch.     metoprolol succinate 50 MG 24 hr tablet  Commonly known as:  TOPROL XL  Take 1 tablet (50 mg total) by mouth daily. Take with or immediately following a meal.     metroNIDAZOLE 500 MG tablet  Commonly known as:  FLAGYL  Take 1 tablet (500 mg total) by mouth 3 (three) times daily.     ondansetron 4 MG  tablet  Commonly known as:  ZOFRAN  Take 1 tablet (4 mg total) by mouth every 6 (six) hours.     oxyCODONE 5 MG immediate release tablet  Commonly known as:  Oxy IR/ROXICODONE  Take 1 tablet (5 mg total) by mouth every 4 (four) hours as needed for moderate pain.     pantoprazole 40 MG tablet  Commonly known as:  PROTONIX  Take 1 tablet (40 mg total) by mouth daily.     polyethylene glycol packet  Commonly known as:  MIRALAX / GLYCOLAX  Take 17 g by mouth daily.     potassium chloride SA 20 MEQ tablet  Commonly known as:  K-DUR,KLOR-CON  Take 2 tablets (40 mEq total) by mouth daily.     saccharomyces boulardii 250 MG capsule  Commonly known as:  FLORASTOR  Take 1 capsule (250 mg total) by mouth 2 (two) times daily.     simethicone 80 MG chewable tablet  Commonly known as:  MYLICON  Chew 1 tablet (80 mg total) by mouth every 6 (six) hours as  needed for flatulence.         Discharge Condition:   Discharge Instructions       Discharge Instructions    Diet - low sodium heart healthy    Complete by:  As directed      Increase activity slowly    Complete by:  As directed            Allergies  Allergen Reactions  . Other Shortness Of Breath and Swelling    Tree nuts  . Penicillins Other (See Comments)    Unknown childhood allergy      Disposition: 30-Still a Patient   Consults: General surgery Interventional radiology    Significant Diagnostic Studies:  Ct Abdomen Pelvis W Contrast  07/20/2015  CLINICAL DATA:  34 year old. Vomiting. Recent colon surgery. Patient unable to tolerate p.o. contrast. EXAM: CT ABDOMEN AND PELVIS WITH CONTRAST TECHNIQUE: Multidetector CT imaging of the abdomen and pelvis was performed using the standard protocol following bolus administration of intravenous contrast. CONTRAST:  138m OMNIPAQUE IOHEXOL 300 MG/ML  SOLN COMPARISON:  06/23/2015 FINDINGS: Lower chest: No pleural effusion identified. There is atelectasis noted in the left base. Hepatobiliary: Diffuse hepatic steatosis identified. No focal liver abnormality. The gallbladder is normal. No biliary dilatation. Pancreas: Negative. Spleen: Negative. Adrenals/Urinary Tract: The adrenal glands are normal. The kidneys are unremarkable. Urinary bladder is collapsed around a Foley catheter balloon. Stomach/Bowel: Normal appearance of the stomach. The proximal small bowel loops have a normal caliber. No bowel wall thickening or dilatation. There is diffuse wall thickening involving the colon. 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 Vascular/Lymphatic: Normal appearance of the abdominal aorta. No enlarged retroperitoneal or mesenteric adenopathy. No enlarged pelvic or inguinal lymph nodes. Reproductive: The uterus and the adnexal structures are unremarkable. Other: Within the left hemi abdomen there is  a well-circumscribed fluid collection measuring 4.6 x 7.7 x 12.0 cm. This appears to exert mass effect upon the adjacent left upper quadrant small bowel loops. There is surrounding fating. stranding. Several smaller fluid collections are identified within the left lower quadrant and central pelvis. The largest of these is along the fundus of the uterus measuring 5.5 x 1.6 x 2.3 cm. Diffuse body wall edema is identified compatible with anasarca. Open ventral abdominal wall wound is identified with drain in place. Musculoskeletal: Unremarkable. IMPRESSION: 1. Examination is remarkable for several abscesses within the abdomen and pelvis.  The largest is in the left hemi abdomen and has mass effect upon the small bowel loops. This has a maximum dimension of 12 cm. 2. 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 The remaining colon is diffusely edematous. 3. Hepatic steatosis. This appears progressive when compared with previous imaging. 4. No evidence to suggest small bowel obstruction. Electronically Signed   By: Kerby Moors M.D.   On: 07/20/2015 15:55   Ct Image Guided Drainage By Percutaneous Catheter  07/21/2015  CLINICAL DATA:  Colonic perforation and status post colectomy. Development of left-sided peritoneal abscess requiring percutaneous drainage. EXAM: CT GUIDED DRAINAGE OF PERITONEAL ABSCESS ANESTHESIA/SEDATION: 150 mcg IV Fentanyl Total Moderate Sedation Time:  20 minutes PROCEDURE: The procedure, risks, benefits, and alternatives were explained to the patient. Questions regarding the procedure were encouraged and answered. The patient understands and consents to the procedure. A time-out was performed prior to the procedure. The left abdominal wall was prepped with Betadine in a sterile fashion, and a sterile drape was applied covering the operative field. A sterile gown and sterile gloves were used for the procedure. Local anesthesia was provided with 1% Lidocaine. CT was performed  in a supine position. Under CT guidance, an 18 gauge trocar needle was advanced into an abscess within the left lower peritoneal cavity. Aspiration of fluid was performed and a sample sent for culture analysis. A guidewire was advanced and the tract dilated. A 12 French percutaneous drainage catheter was placed. Catheter position was confirmed by CT. The catheter was flushed and connected to a suction bulb. It was secured at the skin with a Prolene retention suture and StatLock device. COMPLICATIONS: None FINDINGS: Aspiration at the level of the left lower quadrant abscess revealed purulent fluid. After placement of the drainage catheter, there is excellent return of purulent fluid. IMPRESSION: CT-guided percutaneous catheter drainage of left lower quadrant peritoneal abscess. A sample of purulent fluid was sent for culture analysis. Output will be followed. Electronically Signed   By: Aletta Edouard M.D.   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 Obesity, morbid 278.01.  ------------------------------------------------------------------- History:  PMH:  Tachycardia.  ------------------------------------------------------------------- Study Conclusions  - Left ventricle: The cavity size was severely dilated. Systolic function was normal. The estimated ejection fraction was 55%. Wall motion was normal; there were no regional wall motion abnormalities. There was an increased relative contribution of atrial contraction to ventricular filling. Doppler parameters are consistent with abnormal left ventricular relaxation (grade 1 diastolic dysfunction). - Aortic valve: Trileaflet; normal thickness, mildly calcified leaflets. - Mitral valve: Calcified annulus. - Tricuspid valve: There was trivial regurgitation. - Pulmonary arteries: PA peak  pressure: 32 mm Hg (S). - Pericardium, extracardiac: A trivial pericardial effusion was identified posterior to the heart.  Filed Weights   07/23/15 0509 07/24/15 0203 07/25/15 0647  Weight: 166.924 kg (368 lb) 169.192 kg (373 lb) 173.728 kg (383 lb)     Microbiology: Recent Results (from the past 240 hour(s))  MRSA PCR Screening     Status: None   Collection Time: 07/21/15 10:06 AM  Result Value Ref Range Status   MRSA by PCR NEGATIVE NEGATIVE Final    Comment:        The GeneXpert MRSA Assay (FDA approved for NASAL specimens only), is one component of a comprehensive MRSA colonization surveillance program. It is not intended to diagnose MRSA infection nor to guide or monitor treatment  for MRSA infections.   Culture, routine-abscess     Status: None   Collection Time: 07/21/15  2:08 PM  Result Value Ref Range Status   Specimen Description ABSCESS ABDOMEN  Final   Special Requests NONE  Final   Gram Stain   Final    NO WBC SEEN NO SQUAMOUS EPITHELIAL CELLS SEEN FEW GRAM NEGATIVE RODS FEW GRAM POSITIVE RODS Performed at [facility]    Culture   Final    ABUNDANT ESCHERICHIA COLI Performed at [facility]    Report Status 07/24/2015 FINAL  Final   Organism ID, Bacteria ESCHERICHIA COLI  Final      Susceptibility   Escherichia coli - MIC*    AMPICILLIN >=32 RESISTANT Resistant     AMPICILLIN/SULBACTAM >=32 RESISTANT Resistant     CEFAZOLIN 8 RESISTANT Resistant     CEFEPIME <=1 SENSITIVE Sensitive     CEFTAZIDIME <=1 SENSITIVE Sensitive     CEFTRIAXONE <=1 SENSITIVE Sensitive     CIPROFLOXACIN >=4 RESISTANT Resistant     GENTAMICIN >=16 RESISTANT Resistant     IMIPENEM <=0.25 SENSITIVE Sensitive     PIP/TAZO <=4 SENSITIVE Sensitive     TOBRAMYCIN 8 INTERMEDIATE Intermediate     TRIMETH/SULFA Value in next row Resistant      >=320 RESISTANT(NOTE)    * ABUNDANT ESCHERICHIA COLI  Anaerobic culture     Status: None (Preliminary result)    Collection Time: 07/21/15  2:09 PM  Result Value Ref Range Status   Specimen Description ABSCESS ABDOMEN  Final   Special Requests NONE  Final   Gram Stain   Final    NO WBC SEEN NO SQUAMOUS EPITHELIAL CELLS SEEN FEW GRAM NEGATIVE RODS FEW GRAM POSITIVE RODS Performed at [facility]    Culture   Final    NO ANAEROBES ISOLATED; CULTURE IN PROGRESS FOR 5 DAYS Performed at [facility]    Report Status PENDING  Incomplete  Urine culture     Status: None (Preliminary result)   Collection Time: 07/22/15  6:11 PM  Result Value Ref Range Status   Specimen Description URINE, SUPRAPUBIC  Final   Special Requests NONE  Final   Culture 20,000 COLONIES/mL ESCHERICHIA COLI  Final   Report Status PENDING  Incomplete       Blood Culture    Component Value Date/Time   SDES URINE, SUPRAPUBIC 07/22/2015 1811   SPECREQUEST NONE 07/22/2015 1811   CULT 20,000 COLONIES/mL ESCHERICHIA COLI 07/22/2015 1811   REPTSTATUS PENDING 07/22/2015 1811      Labs: Results for orders placed or performed during the hospital encounter of 07/21/15 (from the past 48 hour(s))  Comprehensive metabolic panel     Status: Abnormal   Collection Time: 07/25/15  4:20 AM  Result Value Ref Range   Sodium 137 135 - 145 mmol/L   Potassium 4.0 3.5 - 5.1 mmol/L   Chloride 103 101 - 111 mmol/L   CO2 31 22 - 32 mmol/L   Glucose, Bld 93 65 - 99 mg/dL   BUN 7 6 - 20 mg/dL   Creatinine, Ser 0.87 0.44 - 1.00 mg/dL   Calcium 7.6 (L) 8.9 - 10.3 mg/dL   Total Protein 4.8 (L) 6.5 - 8.1 g/dL   Albumin 1.2 (L) 3.5 - 5.0 g/dL   AST 20 15 - 41 U/L   ALT 25 14 - 54 U/L   Alkaline Phosphatase 203 (H) 38 - 126 U/L   Total Bilirubin 0.9 0.3 - 1.2  mg/dL   GFR calc non Af Amer >60 >60 mL/min   GFR calc Af Amer >60 >60 mL/min    Comment: (NOTE) The eGFR has been calculated using the CKD EPI equation. This calculation has not been validated in all clinical situations. eGFR's persistently <60 mL/min signify  possible Chronic Kidney Disease.    Anion gap 3 (L) 5 - 15     Lipid Panel     Component Value Date/Time   CHOL 144 04/07/2015 1124   TRIG 109 06/09/2015 2255   HDL 42.30 04/07/2015 1124   CHOLHDL 3 04/07/2015 1124   VLDL 14.2 04/07/2015 1124   LDLCALC 88 04/07/2015 1124     Lab Results  Component Value Date   HGBA1C 5.2 04/07/2015   HGBA1C 5.6 05/02/2014   HGBA1C  07/13/2010    5.5 (NOTE)                                                                       According to the ADA Clinical Practice Recommendations for 2011, when HbA1c is used as a screening test:   >=6.5%   Diagnostic of Diabetes Mellitus           (if abnormal result  is confirmed)  5.7-6.4%   Increased risk of developing Diabetes Mellitus  References:Diagnosis and Classification of Diabetes Mellitus,Diabetes HKVQ,2595,63(OVFIE 1):S62-S69 and Standards of Medical Care in         Diabetes - 2011,Diabetes Care,2011,34  (Suppl 1):S11-S61.     Lab Results  Component Value Date   LDLCALC 88 04/07/2015   CREATININE 0.87 07/25/2015     34 year old female patient who was recently admitted to the acute care portion of the hospital on 9/11 secondary to severe abdominal pain related to perforated sigmoid colon in the setting of new diagnosis of Crohn's colitis. According to the EMR, the patient had been having recurrent abdominal symptoms felt to be either diverticulitis or colitis since January 2016 which required admission in February 2016. Her symptoms were quiescent until May/June 2016 when she redeveloped anorexia with associated nausea vomiting and diarrhea. She had been unable to see a gastroenterologist in the outpatient setting because of lack of insurance and referral issues but just prior to the September admission she was finally able to see a gastroenterologist. During that evaluation her most recent CT scan had been reviewed and the plan was to continue antibiotics and to return for follow-up. Unfortunately in  the interim she had developed severe abdominal pain and presented to the emergency department for evaluation. Initial workup revealed perforated left colon with peritonitis and peritoneal abscess requiring emergent expiratory laparotomy on 9/16 with partial colon resection and drainage of intra-abdominal abscess. She returned to the OR on 9/18 for reexploration of the abdomen with closure and mobilization of the splenic flexure and creation of colostomy. In the immediate postop period patient had septic shock and required short-term ventilation but was eventually stabilized and transitioned out to step down and then to the general medical floor. During the course of the hospitalization an echocardiogram was obtained that demonstrated an ejection fraction of 20% with diffuse hypokinesis which was new for this patient compared to previous echocardiogram in 2011. Etiology was felt to be secondary to stress critical illness  and recent septic shock. Cardiology evaluated the patient and anticipated improvement in ejection fraction over time. She also has underlying persistent sinus tachycardia and was on beta blockers prior to admission. Cardiology also added twice a day Lasix during this hospitalization. Patient was also transfused packed red blood cells due to anemia of critical illness and hemoglobin has remained stable around 7.7-8.0. She also had issues with acute renal failure secondary to septic shock and ATN with peak creatinine of 2.8 with creatinine at time of discharge to rehabilitation 1.0. Patient reported issues with recurrent chronic anorexia with associated nausea vomiting diarrhea and abdominal pain dating back to February 2016 and during this hospitalization patient's laboratory data supported severe malnutrition with an acute on chronic component. She was subsequent discharged to the rehabilitation unit on 06/26/15.  Unfortunately since arrival to the rehabilitation unit patient has not thrived. She  has continued to require a Foley catheter. She has struggled with failure to thrive and profound depression prompting nutritional as well as psychological consultation with the addition of appropriate antidepressant medications. She has also had issues of recurrent nausea and vomiting and inability to tolerate solid diet as well as liquids. This prompted gastroenterology consultation on 10/24. She eventually underwent EGD on 10/26 which revealed a small hiatal hernia as well as mildly edematous folds throughout. She was also found to have either tortuous duodenum jejunum junction at ligament of Treitz or increased edema and the gastroenterologist was unable to advance the scope passed that turn. This prompted a contrasted CT of the abdomen and pelvis which revealed an abscess (4.6 x 7.7 x 12 cm) obstructing her ligament of Treitz which goes along with her endoscopic findings. In addition there were several smaller fluid collections identified within the left lower quadrant and central pelvis with the largest measuring 5.5 X 1.6 X 2.3 cm. Gastroenterology discussed with interventional radiology who plans to evaluate the patient for possible percutaneous drainage. Surgery has also evaluated the patient today and recommends holding off on antibiotics until after fluid collection could be drained and culture results have returned since patient is afebrile, hemodynamically stable and has no leukocytosis. Because of these above issues rehabilitative medicine has requested the patient transfer back to the acute care setting and we will reassume care of this patient as attending physicians.   Consultants:  General surgery  Procedures: CT GUIDED DRAINAGE OF PERITONEAL ABSCESS 10/28   HOSPITAL COURSE: Postoperative intra-abdominal abscess 2/2 perforated sigmoid colon in setting of new Crohn's disease -Continue wound VAC, Abdominal drain 10 mL out last 24 hours.Underwent percutaneous drainage of left lower  quadrant abscess by IR 10/28. Continued drain pending follow-up with interventional radiology drain clinic next week. Discussed with Dr. Barbie Banner, prior to discharge. Interventional radiology to set up follow-up appointment.  Culture from the abscess shows Escherichia coli sensitive to ceftriaxone, and resistant to ciprofloxacin -Continue routine ostomy care by wound care, Patient treated with Rocephin and Flagyl during this hospitalization, for UTI and intra-abdominal abscess  Patient was switched to Keflex and Flagyl prior to discharge for 3 weeks Patient evaluated by general surgery, recommended to continue with diet which she is tolerating well Drain flushes once a with 10 mL of normal saline  POD 42 s/p ex lap with DRAINAGE OF INTRA-ABDOMINAL ABSCESS, PARTIAL COLON RESECTION, re-exploration and creation of colostomy and abdominal closure--by-Dr. B. Thompson -Continue abd binder , wound care dressing changes by WOC Full thickness post-op wound 100% red. Apply  Mepitel contact layer, then one piece of white foam to  lower inner wound, then one piece of black foam to 148m cont suction    Severe left ventricular systolic dysfunction -New finding this admission in setting of septic shock and critical illness with EF of 20% Repeat echocardiogram shows that patient's cardiac function has recovered. EF of 55% -Compensated at present without signs of heart failure on exam   Hypokalemia/hypomagnesemia Replete  Hyponatremia-likely due  to poor oral intake, improved  After hydration with normal saline   OSA/OHA/pulmonary hypertension -Patient continues to refuse utilization of CPAP at hour of sleep   Recent E. Coli UTI -Just completed a course of Keflex on 10/25 (Fluoroquinolone resistant) Remove Foley catheter next day 3-4 days at SNF once patient starts ambulating . Patient states that she's had it for 2 weeks    Sinus tachycardia (HEnterprise -Likely related to underlying deconditioning as  well as ongoing pain and anxiety -Continue preadmission beta blocker   Adjustment disorder with mixed anxiety and depressed mood Continue Lexapro and Ritalin  -Continue to follow -Certainly contributing to patient's overall well-being and ability to participate in rehabilitative therapies   Morbid obesity- BMI 67.5/protein calorie malnutrition severe -Weight dating back to April 2016 410 pounds with current weight 332 pounds -Most recent preop human 6.7 with an albumin of 1.1 on 10/27 -Continue nutrition evaluation and treatment Continue protein supplementation .   Hepatic steatosis -Most recent LFTs normal except for elevated alkaline phosphatase of 263 -Follow closely if required to initiate parenteral nutrition   Iron deficiency anemia -Did require transfusion postoperatively Hemoglobin stable around 72.6-8.3-Certainly influenced by poor nutrition Iron panel consistent with anemia of chronic disease, folate is low therefore start folate supplementation    Discharge Exam:    Blood pressure 119/70, pulse 92, temperature 97.5 F (36.4 C), temperature source Oral, resp. rate 18, height _0  (1.575 m), weight 173.728 kg (383 lb), last menstrual period 05/25/2015, SpO2 100 %. General: No acute respiratory distress Lungs: Clear to auscultation bilaterally without wheezes or crackles Cardiovascular: Regular rate and rhythm without murmur gallop or rub normal S1 and S2 Abd: soft, less tender, mildly tender around her colostomy, colostomy with good output, wound VAC in place and changed this morning by WOC, RN. Drain is more serous in nature today and thin Extremities: No significant cyanosis, clubbing, or edema bilateral lower extremities    Follow-up Information    Follow up with TZenovia Jarred MD On 08/02/2015.   Specialty:  General Surgery   Why:  10:10am, arrive by 9:40am for paperwork   Contact information:   [id number]  [phone]      Follow up with OSEI-BONSU,GEORGE, MD. Schedule an appointment as soon as possible for a visit in 3 days.   Specialty:  Internal Medicine   Contact information:   [phone]      Signed: AReyne Dumas11/09/2014, 10:15 AM        Time spent >45 mins

## 2015-07-26 ENCOUNTER — Other Ambulatory Visit: Payer: Self-pay | Admitting: General Surgery

## 2015-07-26 ENCOUNTER — Other Ambulatory Visit: Payer: Self-pay | Admitting: Radiology

## 2015-07-26 ENCOUNTER — Other Ambulatory Visit (HOSPITAL_COMMUNITY): Payer: Self-pay | Admitting: Interventional Radiology

## 2015-07-26 DIAGNOSIS — T814XXD Infection following a procedure, subsequent encounter: Principal | ICD-10-CM

## 2015-07-26 DIAGNOSIS — IMO0001 Reserved for inherently not codable concepts without codable children: Secondary | ICD-10-CM

## 2015-07-26 DIAGNOSIS — K651 Peritoneal abscess: Principal | ICD-10-CM

## 2015-07-26 LAB — ANAEROBIC CULTURE: Gram Stain: NONE SEEN

## 2015-08-03 ENCOUNTER — Other Ambulatory Visit: Payer: Medicaid Other

## 2015-08-03 ENCOUNTER — Inpatient Hospital Stay: Admission: RE | Admit: 2015-08-03 | Payer: Medicaid Other | Source: Ambulatory Visit

## 2015-08-07 NOTE — Telephone Encounter (Signed)
The patient has finally been released from the hospital I had to leave a message on Dominique [name]'s phone for Dominique [name] to call about an appt with Tammy Parrett

## 2015-08-08 ENCOUNTER — Ambulatory Visit
Admission: RE | Admit: 2015-08-08 | Discharge: 2015-08-08 | Disposition: A | Discharge status: Home / Self Care | Payer: Medicaid Other | Source: Ambulatory Visit | Attending: General Surgery | Admitting: General Surgery

## 2015-08-08 ENCOUNTER — Ambulatory Visit
Admission: RE | Admit: 2015-08-08 | Discharge: 2015-08-08 | Disposition: A | Discharge status: Home / Self Care | Payer: Medicaid Other | Source: Ambulatory Visit | Attending: Interventional Radiology | Admitting: Interventional Radiology

## 2015-08-08 DIAGNOSIS — K651 Peritoneal abscess: Principal | ICD-10-CM

## 2015-08-08 DIAGNOSIS — IMO0001 Reserved for inherently not codable concepts without codable children: Secondary | ICD-10-CM

## 2015-08-08 DIAGNOSIS — T814XXD Infection following a procedure, subsequent encounter: Principal | ICD-10-CM

## 2015-08-08 MED ORDER — IOPAMIDOL (ISOVUE-300) INJECTION 61%
125.0000 mL | Freq: Once | INTRAVENOUS | Status: AC | PRN
  Administered 2015-08-08: 125 mL via INTRAVENOUS

## 2015-08-08 NOTE — Progress Notes (Signed)
Interventional Radiology Progress Note  Ms Radziewicz is a 34 year old female with a history of left colectomy and a left abdominal abscess.  This was drained with CT guided drain placement.   She is currently residing at an assisted living facility.    CT performed today's date shows no residual fluid collection.  Results shared with Dr. Georganna Skeans.   Drain removed at the bedside without complication.   Recommendations:  - Routine wound care.  - No specific imaging required. - Encourage observing future surgical appointments.   Signed,  Dulcy Fanny. Earleen Newport, DO

## 2015-09-04 ENCOUNTER — Encounter: Payer: Medicaid Other | Admitting: Family Medicine

## 2015-09-23 ENCOUNTER — Other Ambulatory Visit: Payer: Self-pay | Admitting: Pulmonary Disease

## 2015-12-08 ENCOUNTER — Emergency Department (HOSPITAL_COMMUNITY)
Admission: EM | Admit: 2015-12-08 | Discharge: 2015-12-09 | Disposition: A | Discharge status: Home / Self Care | Payer: Medicaid Other | Attending: Emergency Medicine | Admitting: Emergency Medicine

## 2015-12-08 DIAGNOSIS — I2510 Atherosclerotic heart disease of native coronary artery without angina pectoris: Secondary | ICD-10-CM | POA: Insufficient documentation

## 2015-12-08 DIAGNOSIS — I509 Heart failure, unspecified: Secondary | ICD-10-CM | POA: Diagnosis not present

## 2015-12-08 DIAGNOSIS — J45909 Unspecified asthma, uncomplicated: Secondary | ICD-10-CM | POA: Diagnosis not present

## 2015-12-08 DIAGNOSIS — D649 Anemia, unspecified: Secondary | ICD-10-CM | POA: Diagnosis not present

## 2015-12-08 DIAGNOSIS — L249 Irritant contact dermatitis, unspecified cause: Secondary | ICD-10-CM | POA: Diagnosis not present

## 2015-12-08 DIAGNOSIS — Z79899 Other long term (current) drug therapy: Secondary | ICD-10-CM | POA: Insufficient documentation

## 2015-12-08 DIAGNOSIS — Z792 Long term (current) use of antibiotics: Secondary | ICD-10-CM | POA: Diagnosis not present

## 2015-12-08 DIAGNOSIS — Z8669 Personal history of other diseases of the nervous system and sense organs: Secondary | ICD-10-CM | POA: Insufficient documentation

## 2015-12-08 DIAGNOSIS — Z8719 Personal history of other diseases of the digestive system: Secondary | ICD-10-CM | POA: Diagnosis not present

## 2015-12-08 DIAGNOSIS — Z880 Allergy status to penicillin: Secondary | ICD-10-CM | POA: Insufficient documentation

## 2015-12-08 DIAGNOSIS — L988 Other specified disorders of the skin and subcutaneous tissue: Secondary | ICD-10-CM | POA: Diagnosis present

## 2015-12-08 DIAGNOSIS — Z87891 Personal history of nicotine dependence: Secondary | ICD-10-CM | POA: Diagnosis not present

## 2015-12-08 DIAGNOSIS — L309 Dermatitis, unspecified: Secondary | ICD-10-CM

## 2015-12-08 NOTE — ED Notes (Signed)
Pt released from [facility] 2 weeks ago, now c/o redness and irritation surrounding colostomy. Pt states she declined home health, pt also would like extra bags, she will not be able to get new ones until Tuesday

## 2015-12-09 ENCOUNTER — Encounter (HOSPITAL_COMMUNITY): Payer: Self-pay | Admitting: Emergency Medicine

## 2015-12-09 NOTE — ED Provider Notes (Signed)
CSN: [id number]     Arrival date & time 12/08/15  2332 History  By signing my name below, I, Rowan Blase, attest that this documentation has been prepared under the direction and in the presence of Veryl Speak, MD . Electronically Signed: Rowan Blase, Scribe. 12/09/2015. 3:39 AM.   Chief Complaint  Patient presents with  . Skin around colostomy red    The history is provided by the patient. No language interpreter was used.   HPI Comments:  Dominique [name] is a 35 y.o. female with PMHx of CHF and CAD who presents to the Emergency Department complaining of redness and irritation surrounding colostomy for the past two weeks. She states she is having difficulty with the bag staying on, which is causing skin irritation and pain. No alleviating factors noted. Pt is using 4-5 bags per day; she is currently out of bags. Pt was discharged from rehab 2 weeks ago following esophagogastroduodenoscopy in November 2016.  Past Medical History  Diagnosis Date  . Asthma   . Sleep apnea   . Anemia   . Morbid obesity ([place]) 03/22/2009    Qualifier: Diagnosis of  By: Ronnald Ramp CMA, [place]    . OSA (obstructive sleep apnea) 05/02/2014  . Thyromegaly 05/02/2014  . Seasonal allergies 06/13/2014  . Acute acalculous cholecystitis 11/16/2014  . Diverticulitis of colon 11/17/2014  . Hepatic steatosis 11/17/2014  . Sickle cell anemia ([place])     She states she " has the trait" (07/20/15)  . CHF (congestive heart failure) ([place])   . Coronary artery disease    Past Surgical History  Procedure Laterality Date  . C section 2011  2011  . Flexible sigmoidoscopy N/A 06/05/2015    Procedure: FLEXIBLE SIGMOIDOSCOPY;  Surgeon: Ronald Lobo, MD;  Location: [place];  Service: Endoscopy;  Laterality: N/A;  . Laparotomy N/A 06/09/2015    Procedure: EXPLORATORY LAPAROTOMY, DRAINAGE OF INTRAABDOMINAL ABSCESS, PARTIAL COLON RESECTION,  APPLICATION OF WOUND VAC;  Surgeon: Georganna Skeans, MD;  Location: [place];  Service:  General;  Laterality: N/A;  . Laparotomy N/A 06/11/2015    Procedure: RE-EXPLORATION OF ABDOMEN;  Surgeon: Georganna Skeans, MD;  Location: [place];  Service: General;  Laterality: N/A;  . Colostomy N/A 06/11/2015    Procedure:  CREATION OF COLOSTOMY;  Surgeon: Georganna Skeans, MD;  Location: [place];  Service: General;  Laterality: N/A;  . Esophagogastroduodenoscopy N/A 07/19/2015    Procedure: ESOPHAGOGASTRODUODENOSCOPY (EGD);  Surgeon: Clarene Essex, MD;  Location: [facility] ENDOSCOPY;  Service: Endoscopy;  Laterality: N/A;   Family History  Problem Relation Age of Onset  . Hypertension Mother   . Hypertension Father   . Hypertension Sister   . Asthma Brother   . Diabetes Maternal Grandmother   . Hypertension Maternal Grandmother   . Hypertension Maternal Grandfather   . Cancer Paternal Grandmother     lung  . Emphysema Paternal Grandfather   . Allergies Mother    Social History  Substance Use Topics  . Smoking status: Former Smoker -- 0.50 packs/day for 4 years    Types: Cigarettes    Start date: 07/24/2014  . Smokeless tobacco: Never Used  . Alcohol Use: No   OB History    No data available     Review of Systems  Gastrointestinal: Positive for abdominal pain (pain around colostomy).  Skin: Positive for color change and wound.  All other systems reviewed and are negative.  Allergies  Other and Penicillins  Home Medications   Prior to Admission medications  Medication Sig Start Date End Date Taking? Authorizing Provider  albuterol (PROVENTIL) (2.5 MG/3ML) 0.083% nebulizer solution Take 3 mLs (2.5 mg total) by nebulization every 6 (six) hours as needed for wheezing or shortness of breath. Patient not taking: Reported on 06/03/2015 10/27/14   Rigoberto Noel, MD  cephALEXin (KEFLEX) 500 MG capsule Take 1 capsule (500 mg total) by mouth 4 (four) times daily. 07/25/15   Reyne Dumas, MD  docusate sodium (COLACE) 100 MG capsule Take 1 capsule (100 mg total) by mouth 2 (two) times daily.  07/25/15   Reyne Dumas, MD  escitalopram (LEXAPRO) 10 MG tablet Take 1 tablet (10 mg total) by mouth daily. 07/25/15   Reyne Dumas, MD  feeding supplement, ENSURE ENLIVE, (ENSURE ENLIVE) LIQD Take 237 mLs by mouth 3 (three) times daily between meals. 07/25/15   Reyne Dumas, MD  ferrous sulfate 325 (65 FE) MG tablet Take 2 tablets (650 mg total) by mouth daily with breakfast. 04/07/15   Midge Minium, MD  folic acid (FOLVITE) 1 MG tablet Take 1 tablet (1 mg total) by mouth daily. 07/25/15   Reyne Dumas, MD  mesalamine (PENTASA) 250 MG CR capsule Take 2 capsules (500 mg total) by mouth 4 (four) times daily. 07/25/15   Reyne Dumas, MD  methocarbamol (ROBAXIN) 500 MG tablet Take 1 tablet (500 mg total) by mouth every 6 (six) hours as needed for muscle spasms. 07/25/15   Reyne Dumas, MD  methylphenidate (RITALIN) 5 MG tablet Take 1 tablet (5 mg total) by mouth 2 (two) times daily with breakfast and lunch. 07/25/15   Reyne Dumas, MD  metoprolol succinate (TOPROL XL) 50 MG 24 hr tablet Take 1 tablet (50 mg total) by mouth daily. Take with or immediately following a meal. 06/26/15   Verlee Monte, MD  metroNIDAZOLE (FLAGYL) 500 MG tablet Take 1 tablet (500 mg total) by mouth 3 (three) times daily. 07/25/15   Reyne Dumas, MD  ondansetron (ZOFRAN) 4 MG tablet Take 1 tablet (4 mg total) by mouth every 6 (six) hours. 05/25/15   Heather Laisure, PA-C  oxyCODONE (OXY IR/ROXICODONE) 5 MG immediate release tablet Take 1 tablet (5 mg total) by mouth every 4 (four) hours as needed for moderate pain. 07/25/15   Reyne Dumas, MD  pantoprazole (PROTONIX) 40 MG tablet Take 1 tablet (40 mg total) by mouth daily. 07/25/15   Reyne Dumas, MD  polyethylene glycol (MIRALAX / GLYCOLAX) packet Take 17 g by mouth daily. 06/26/15   Verlee Monte, MD  potassium chloride SA (K-DUR,KLOR-CON) 20 MEQ tablet Take 2 tablets (40 mEq total) by mouth daily. 07/25/15   Reyne Dumas, MD  PROAIR HFA 108 (90 BASE) MCG/ACT inhaler INHALE 2 PUFFS BY  MOUTH INTO THE LUNGS EVERY 6 HOURS AS NEEDED FOR WHEEZING OR SHORTNESS OF BREATH 02/17/15   Midge Minium, MD  saccharomyces boulardii (FLORASTOR) 250 MG capsule Take 1 capsule (250 mg total) by mouth 2 (two) times daily. 06/26/15   Verlee Monte, MD  simethicone (MYLICON) 80 MG chewable tablet Chew 1 tablet (80 mg total) by mouth every 6 (six) hours as needed for flatulence. 07/25/15   Reyne Dumas, MD   BP 135/68 mmHg  Pulse 87  Temp(Src) 98 F (36.7 C) (Oral)  Resp 20  Ht 5' 2"  (1.575 m)  Wt 330 lb (149.687 kg)  BMI 60.34 kg/m2  SpO2 100%  LMP 11/21/2015 Physical Exam  Constitutional: She appears well-developed and well-nourished.  HENT:  Head: Normocephalic and atraumatic.  Eyes: Conjunctivae are  normal. Right eye exhibits no discharge. Left eye exhibits no discharge.  Pulmonary/Chest: Effort normal. No respiratory distress.  Abdominal:  Abdomen is obese. There is an ostomy site at the left mid abdomen. There is some inflammation of the surrounding skin. No purulent drainage.  Neurological: She is alert. Coordination normal.  Skin: Skin is warm and dry. No rash noted. She is not diaphoretic. No erythema.  Psychiatric: She has a normal mood and affect.  Nursing note and vitals reviewed.  ED Course  Procedures  DIAGNOSTIC STUDIES:  Oxygen Saturation is 100% on RA, normal by my interpretation.    COORDINATION OF CARE:  3:18 AM Will discharge with extra bags. Recommended pt keep bag as close to ostomy as possible. Discussed treatment plan with pt at bedside and pt agreed to plan.  Labs Review Labs Reviewed - No data to display  Imaging Review No results found.    MDM   Final diagnoses:  None    There is an area of skin breakdown near the ostomy site. It does not appear to be infectious, rather inflamed. She is to follow-up with her surgeon for a referral to the ostomy nurse.  I personally performed the services described in this documentation, which was scribed  in my presence. The recorded information has been reviewed and is accurate.       Veryl Speak, MD 12/09/15 [phone]

## 2015-12-09 NOTE — Discharge Instructions (Signed)
Follow-up with your general surgeon for a referral to the ostomy nurse.   Rash A rash is a change in the color or texture of the skin. There are many different types of rashes. You may have other problems that accompany your rash. CAUSES   Infections.  Allergic reactions. This can include allergies to pets or foods.  Certain medicines.  Exposure to certain chemicals, soaps, or cosmetics.  Heat.  Exposure to poisonous plants.  Tumors, both cancerous and noncancerous. SYMPTOMS   Redness.  Scaly skin.  Itchy skin.  Dry or cracked skin.  Bumps.  Blisters.  Pain. DIAGNOSIS  Your caregiver may do a physical exam to determine what type of rash you have. A skin sample (biopsy) may be taken and examined under a microscope. TREATMENT  Treatment depends on the type of rash you have. Your caregiver may prescribe certain medicines. For serious conditions, you may need to see a skin doctor (dermatologist). HOME CARE INSTRUCTIONS   Avoid the substance that caused your rash.  Do not scratch your rash. This can cause infection.  You may take cool baths to help stop itching.  Only take over-the-counter or prescription medicines as directed by your caregiver.  Keep all follow-up appointments as directed by your caregiver. SEEK IMMEDIATE MEDICAL CARE IF:  You have increasing pain, swelling, or redness.  You have a fever.  You have new or severe symptoms.  You have body aches, diarrhea, or vomiting.  Your rash is not better after 3 days. MAKE SURE YOU:  Understand these instructions.  Will watch your condition.  Will get help right away if you are not doing well or get worse.   This information is not intended to replace advice given to you by your health care provider. Make sure you discuss any questions you have with your health care provider.   Document Released: 08/30/2002 Document Revised: 09/30/2014 Document Reviewed: 01/25/2015 Elsevier Interactive Patient  Education [employer].

## 2015-12-25 ENCOUNTER — Encounter (HOSPITAL_COMMUNITY): Payer: Self-pay | Admitting: *Deleted

## 2015-12-25 NOTE — Progress Notes (Signed)
Pt is morbidly obese, BMI is 60.34. Pt has has of cardiomyopathy, has sleep apnea (uses a bipap). Heart issues were found while she was an inpatient in the fall with abd pain that led to a colostomy. Was in SNF until a few weeks ago. Pt states her last menstrual period was 11/20/15.

## 2015-12-27 ENCOUNTER — Encounter (HOSPITAL_COMMUNITY)
Admission: RE | Disposition: A | Discharge status: Home / Self Care | Payer: Self-pay | Source: Ambulatory Visit | Attending: Gastroenterology

## 2015-12-27 ENCOUNTER — Ambulatory Visit (HOSPITAL_COMMUNITY)
Admission: RE | Admit: 2015-12-27 | Discharge: 2015-12-27 | Disposition: A | Discharge status: Home / Self Care | Payer: Medicaid Other | Source: Ambulatory Visit | Attending: Gastroenterology | Admitting: Gastroenterology

## 2015-12-27 ENCOUNTER — Encounter (HOSPITAL_COMMUNITY): Payer: Self-pay | Admitting: Certified Registered"

## 2015-12-27 ENCOUNTER — Ambulatory Visit (HOSPITAL_COMMUNITY): Payer: Medicaid Other | Admitting: Certified Registered"

## 2015-12-27 DIAGNOSIS — Z87891 Personal history of nicotine dependence: Secondary | ICD-10-CM | POA: Insufficient documentation

## 2015-12-27 DIAGNOSIS — I110 Hypertensive heart disease with heart failure: Secondary | ICD-10-CM | POA: Insufficient documentation

## 2015-12-27 DIAGNOSIS — Z6841 Body Mass Index (BMI) 40.0 and over, adult: Secondary | ICD-10-CM | POA: Diagnosis not present

## 2015-12-27 DIAGNOSIS — Z8719 Personal history of other diseases of the digestive system: Secondary | ICD-10-CM | POA: Diagnosis not present

## 2015-12-27 DIAGNOSIS — Z79899 Other long term (current) drug therapy: Secondary | ICD-10-CM | POA: Diagnosis not present

## 2015-12-27 DIAGNOSIS — Z933 Colostomy status: Secondary | ICD-10-CM | POA: Diagnosis not present

## 2015-12-27 DIAGNOSIS — I509 Heart failure, unspecified: Secondary | ICD-10-CM | POA: Diagnosis not present

## 2015-12-27 HISTORY — DX: Polyneuropathy, unspecified: G62.9

## 2015-12-27 HISTORY — DX: Anxiety disorder, unspecified: F41.9

## 2015-12-27 HISTORY — DX: Headache: R51

## 2015-12-27 HISTORY — DX: Essential (primary) hypertension: I10

## 2015-12-27 HISTORY — DX: Adverse effect of unspecified anesthetic, initial encounter: T41.45XA

## 2015-12-27 HISTORY — DX: Headache, unspecified: R51.9

## 2015-12-27 HISTORY — PX: COLONOSCOPY WITH PROPOFOL: SHX5780

## 2015-12-27 HISTORY — DX: Other complications of anesthesia, initial encounter: T88.59XA

## 2015-12-27 LAB — PREGNANCY, URINE: PREG TEST UR: NEGATIVE

## 2015-12-27 SURGERY — COLONOSCOPY WITH PROPOFOL
Anesthesia: Monitor Anesthesia Care

## 2015-12-27 MED ORDER — PROPOFOL 500 MG/50ML IV EMUL
INTRAVENOUS | Status: DC | PRN
  Administered 2015-12-27: 75 ug/kg/min via INTRAVENOUS

## 2015-12-27 MED ORDER — PROPOFOL 10 MG/ML IV BOLUS
INTRAVENOUS | Status: DC | PRN
  Administered 2015-12-27 (×3): 20 mg via INTRAVENOUS

## 2015-12-27 MED ORDER — LACTATED RINGERS IV SOLN
INTRAVENOUS | Status: DC
  Administered 2015-12-27: 1000 mL via INTRAVENOUS

## 2015-12-27 MED ORDER — SODIUM CHLORIDE 0.9 % IV SOLN: INTRAVENOUS | Status: DC

## 2015-12-27 NOTE — Discharge Instructions (Signed)
Colonoscopy, Care After These instructions give you information on caring for yourself after your procedure. Your doctor may also give you more specific instructions. Call your doctor if you have any problems or questions after your procedure. HOME CARE  Do not drive for 24 hours.  Do not sign important papers or use machinery for 24 hours.  You may shower.  You may go back to your usual activities, but go slower for the first 24 hours.  Take rest breaks often during the first 24 hours.  Walk around or use warm packs on your belly (abdomen) if you have belly cramping or gas.  Drink enough fluids to keep your pee (urine) clear or pale yellow.  Resume your normal diet. Avoid heavy or fried foods.  Avoid drinking alcohol for 24 hours or as told by your doctor.  Only take medicines as told by your doctor. If a tissue sample (biopsy) was taken during the procedure:   Do not take aspirin or blood thinners for 7 days, or as told by your doctor.  Do not drink alcohol for 7 days, or as told by your doctor.  Eat soft foods for the first 24 hours. GET HELP IF: You still have a small amount of blood in your poop (stool) 2-3 days after the procedure. GET HELP RIGHT AWAY IF:  You have more than a small amount of blood in your poop.  You see clumps of tissue (blood clots) in your poop.  Your belly is puffy (swollen).  You feel sick to your stomach (nauseous) or throw up (vomit).  You have a fever.  You have belly pain that gets worse and medicine does not help. MAKE SURE YOU:  Understand these instructions.  Will watch your condition.  Will get help right away if you are not doing well or get worse.   This information is not intended to replace advice given to you by your health care provider. Make sure you discuss any questions you have with your health care provider.   Document Released: 10/12/2010 Document Revised: 09/14/2013 Document Reviewed: 05/17/2013 Elsevier  Interactive Patient Education [employer].

## 2015-12-27 NOTE — Op Note (Addendum)
[facility] Patient Name: Dominique [name] Procedure Date : 12/27/2015 MRN: [id number] Attending MD: Wonda Horner , MD Date of Birth: [birth year] CSN: [id number] Age: 35 Admit Type: Inpatient Procedure:                Colonoscopy Indications:              Personal history of Crohn's disease Providers:                Wonda Horner, MD, [name], RN, Corliss Parish, Technician Referring MD:              Medicines:                Propofol per Anesthesia Complications:            No immediate complications. Estimated Blood Loss:     some friablitiy of stoma Procedure:                Pre-Anesthesia Assessment:                           - Prior to the procedure, a History and Physical                            was performed, and patient medications and                            allergies were reviewed. The patient's tolerance of                            previous anesthesia was also reviewed. The risks                            and benefits of the procedure and the sedation                            options and risks were discussed with the patient.                            All questions were answered, and informed consent                            was obtained. Prior Anticoagulants: The patient has                            taken no previous anticoagulant or antiplatelet                            agents. ASA Grade Assessment: III - A patient with                            severe systemic disease. After reviewing the risks  and benefits, the patient was deemed in                            satisfactory condition to undergo the procedure.                           After obtaining informed consent, the colonoscope                            was passed under direct vision. Throughout the                            procedure, the patient's blood pressure, pulse, and                            oxygen saturations  were monitored continuously. The                            EC-[id number] ([id number]) scope was introduced through                            the descending colostomy and advanced to the the                            cecum, identified by appendiceal orifice and                            ileocecal valve. The Colonoscope was introduced                            through the anus and advanced to the. The ileocecal                            valve and the appendiceal orifice were                            photographed. The colonoscopy was performed without                            difficulty. The patient tolerated the procedure                            well. The quality of the bowel preparation was fair. Scope In: 9:23:00 AM Scope Out: 9:29:00 AM Total Procedure Duration: 0 hours 6 minutes 0 seconds  Findings:      The entire examined colon appeared normal. Impression:               - Preparation of the colon was fair.                           - The entire examined colon is normal.                           - No specimens collected. Moderate Sedation:      .  Recommendation:           - Resume regular diet.                           - Continue present medications.                           - Repeat colonoscopy in 10 years for surveillance. Procedure Code(s):        --- Professional ---                           [phone], Colonoscopy through stoma; diagnostic,                            including collection of specimen(s) by brushing or                            washing, when performed (separate procedure) Diagnosis Code(s):        --- Professional ---                           Z87.19, Personal history of other diseases of the                            digestive system CPT copyright 2016 American Medical Association. All rights reserved. The codes documented in this report are preliminary and upon coder review may  be revised to meet current compliance requirements. Anson Fret, MD Wonda Horner, MD 12/27/2015 9:59:45 AM This report has been signed electronically. Number of Addenda: 0

## 2015-12-27 NOTE — H&P (Signed)
  The patient has a history of Crohn's disease. She presented to the endoscopy unit today at the hospital for an outpatient colonoscopy via her colostomy. She previously had perforation of her sigmoid colon related to her Crohn's disease. She is undergoing colonoscopy today to investigate the rest of her colon prior to intended reversal of her colostomy  Physical:  She is in no distress  Heart regular rhythm  Lungs clear  Abdomen: Bowel sounds present, soft, nontender, colostomy bag on left side of abdomen.  Impression: Patient with Crohn's disease here for colonoscopy via colostomy prior to intended reversal of her colostomy in the near future.

## 2015-12-27 NOTE — Anesthesia Preprocedure Evaluation (Signed)
Anesthesia Evaluation  Patient identified by MRN, date of birth, ID band Patient awake    Reviewed: Allergy & Precautions, NPO status , Patient's Chart, lab work & pertinent test results  Airway Mallampati: II  TM Distance: >3 FB Neck ROM: Full    Dental no notable dental hx.    Pulmonary sleep apnea , former smoker,    breath sounds clear to auscultation + decreased breath sounds      Cardiovascular hypertension, +CHF  Normal cardiovascular exam Rhythm:Regular Rate:Normal     Cardiovascular +CHF  Normal cardiovascular exam Rhythm:Regular Rate:Normal Left ventricle: The cavity size was moderately dilated. Wall thickness was increased in a pattern of mild LVH. Systolic function was severely reduced. The estimated ejection fraction was 20%. Diffuse hypokinesis. The study is not technically sufficient to allow evaluation of LV diastolic function. - Ventricular septum: Septal motion showed paradox. - Left atrium: Mildly dilated at 35 ml/m2. - Right ventricle: The cavity size was mildly dilated. The moderator band was prominent. Systolic function was normal. - Right atrium: The atrium was mildly dilated. - Tricuspid valve: There was mild regurgitation. - Pulmonary arteries: PA peak pressure: 40 mm Hg (S) + RAP. - Pericardium, extracardiac: A trivial pericardial effusion was identified.        Neuro/Psych negative neurological ROS  negative psych ROS   GI/Hepatic negative GI ROS, Neg liver ROS,   Endo/Other  negative endocrine ROSMorbid obesity  Renal/GU negative Renal ROS  negative genitourinary   Musculoskeletal negative musculoskeletal ROS (+)   Abdominal (+) + obese,   Peds negative pediatric ROS (+)  Hematology negative hematology ROS (+)   Anesthesia Other Findings   Reproductive/Obstetrics negative OB ROS                             Anesthesia  Physical Anesthesia Plan  ASA: IV  Anesthesia Plan: MAC   Post-op Pain Management:    Induction: Intravenous  Airway Management Planned: Nasal Cannula  Additional Equipment:   Intra-op Plan:   Post-operative Plan: Extubation in OR  Informed Consent: I have reviewed the patients History and Physical, chart, labs and discussed the procedure including the risks, benefits and alternatives for the proposed anesthesia with the patient or authorized representative who has indicated his/her understanding and acceptance.   Dental advisory given  Plan Discussed with: CRNA and Surgeon  Anesthesia Plan Comments:         Anesthesia Quick Evaluation

## 2015-12-27 NOTE — Transfer of Care (Signed)
Immediate Anesthesia Transfer of Care Note  Patient: Dominique [name]  Procedure(s) Performed: Procedure(s): COLONOSCOPY WITH PROPOFOL (N/A)  Patient Location: Endoscopy Unit  Anesthesia Type:MAC  Level of Consciousness: awake, oriented and patient cooperative  Airway & Oxygen Therapy: Patient Spontanous Breathing  Post-op Assessment: Report given to RN, Post -op Vital signs reviewed and stable and Patient moving all extremities  Post vital signs: Reviewed and stable  Last Vitals:  Filed Vitals:   12/27/15 0817  BP: 126/72  Pulse: 94  Temp: 36.6 C  Resp: 12    Complications: No apparent anesthesia complications

## 2015-12-27 NOTE — Anesthesia Procedure Notes (Signed)
Procedure Name: MAC Date/Time: 12/27/2015 8:56 AM Performed by: Melina Copa, Brayson Livesey R Pre-anesthesia Checklist: Patient identified, Emergency Drugs available, Suction available, Patient being monitored and Timeout performed Patient Re-evaluated:Patient Re-evaluated prior to inductionOxygen Delivery Method: Circle system utilized Preoxygenation: Pre-oxygenation with 100% oxygen Placement Confirmation: positive ETCO2 Dental Injury: Teeth and Oropharynx as per pre-operative assessment

## 2015-12-27 NOTE — Anesthesia Postprocedure Evaluation (Signed)
Anesthesia Post Note  Patient: Dominique [name]  Procedure(s) Performed: Procedure(s) (LRB): COLONOSCOPY WITH PROPOFOL (N/A)  Patient location during evaluation: PACU Anesthesia Type: MAC Level of consciousness: awake and alert Pain management: pain level controlled Vital Signs Assessment: post-procedure vital signs reviewed and stable Respiratory status: spontaneous breathing, nonlabored ventilation, respiratory function stable and patient connected to nasal cannula oxygen Cardiovascular status: blood pressure returned to baseline and stable Postop Assessment: no signs of nausea or vomiting Anesthetic complications: no    Last Vitals:  Filed Vitals:   12/27/15 0955 12/27/15 1005  BP: 122/70 125/71  Pulse: 80 77  Temp:    Resp: 14 14    Last Pain: There were no vitals filed for this visit.               Cherissa Hook S

## 2015-12-27 NOTE — Progress Notes (Signed)
Pt for colonoscopy, has colostomy with one piece bag.  Did not bring replacement bag. Unable to proceed without removing bag.  Stoma small with small amount of bright red blood after procedure.  Skin surrounding stoma with several escoriated areas.  MD aware.  Post px 2 3/4 holister bag placed.  Pt aware and states "my skin has been really bad looking forward to having it reversed".

## 2015-12-28 ENCOUNTER — Encounter (HOSPITAL_COMMUNITY): Payer: Self-pay | Admitting: Gastroenterology

## 2016-05-01 ENCOUNTER — Telehealth: Payer: Self-pay | Admitting: Medical

## 2016-05-01 NOTE — Telephone Encounter (Signed)
Pt did not get labs done that I ordered but looks like she had colonoscopy done to work up her anemia. Will you verify with her to follow up with Dr. Landry Corporal. This is medicaid pt and appears he had to refer her to get that colonoscopy done. So best for her to follow up with him.

## 2016-09-21 IMAGING — CT CT ABD-PELV W/ CM
3 of 4 series · 12 of 36 positions shown, 18 images · IV contrast ([ID] ISOVUE 300)
Comparison: 07/21/2015, 07/20/2015

CLINICAL DATA: 34-year-old female with a history of left colectomy,
inflammatory bowel disease, and percutaneous drainage of prior
abscess.

EXAM:
CT ABDOMEN AND PELVIS WITH CONTRAST
TECHNIQUE: Multidetector CT imaging of the abdomen and pelvis was performed
using the standard protocol following bolus administration of
intravenous contrast.
CONTRAST:  125mL ZXHNRX-R00 IOPAMIDOL (ZXHNRX-R00) INJECTION 61%

[Series 3: abd/pelvis · axial · 0.98mm/px · z∈[-405,-15]mm · 8 of 102 slices shown, 13 images]
[im 12/102  soft-tissue]
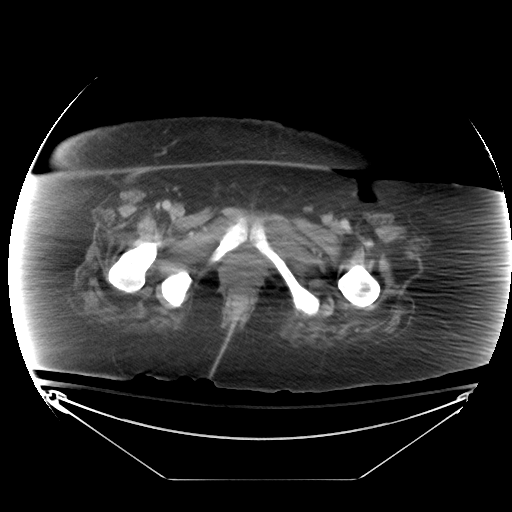
[im 12/102  bone]
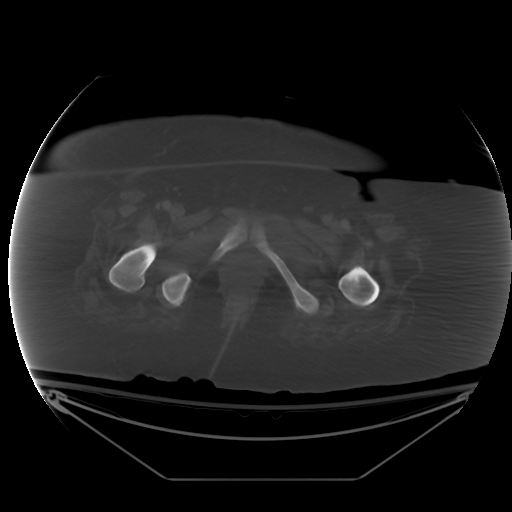
[im 23/102  soft-tissue]
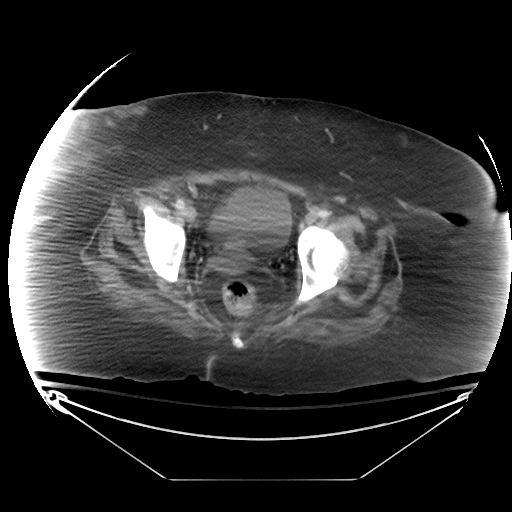
[im 34/102  soft-tissue]
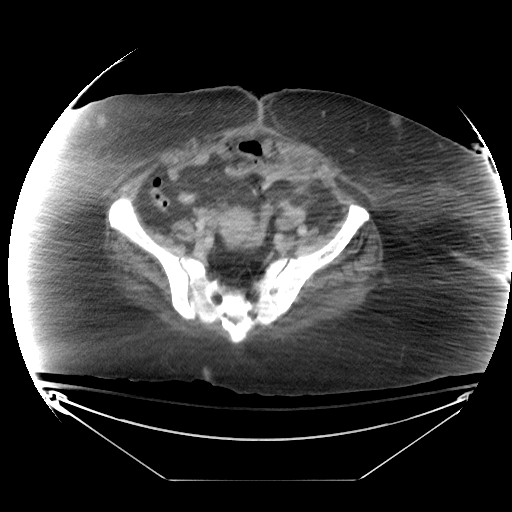
[im 45/102  soft-tissue]
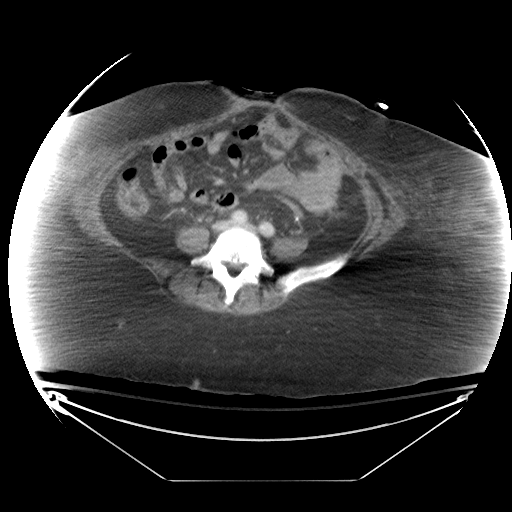
[im 57/102  soft-tissue]
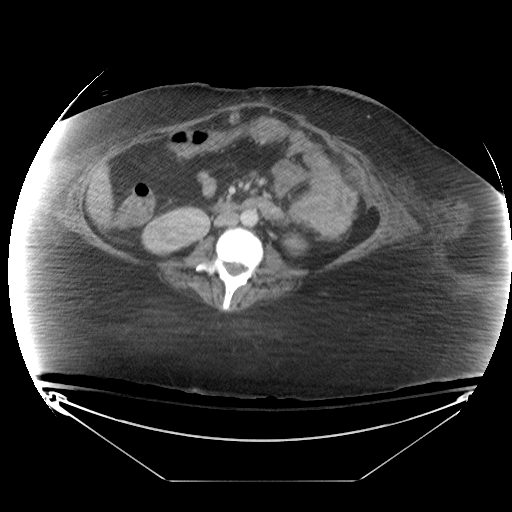
[im 57/102  lung]
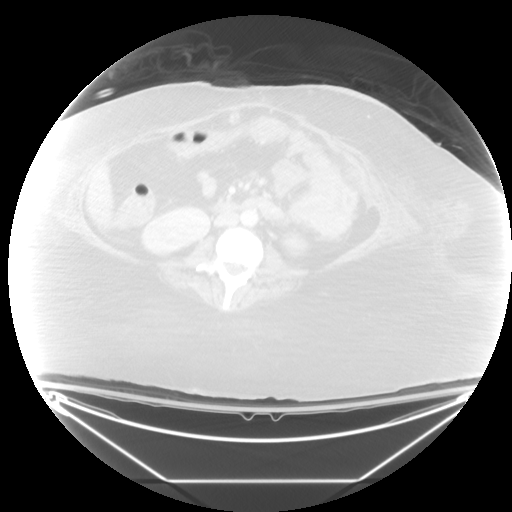
[im 68/102  soft-tissue]
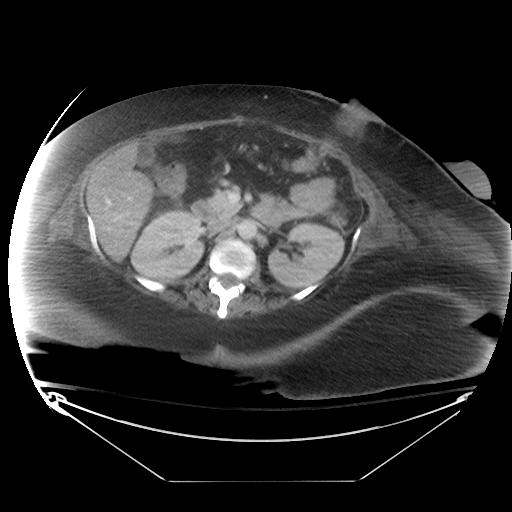
[im 68/102  lung]
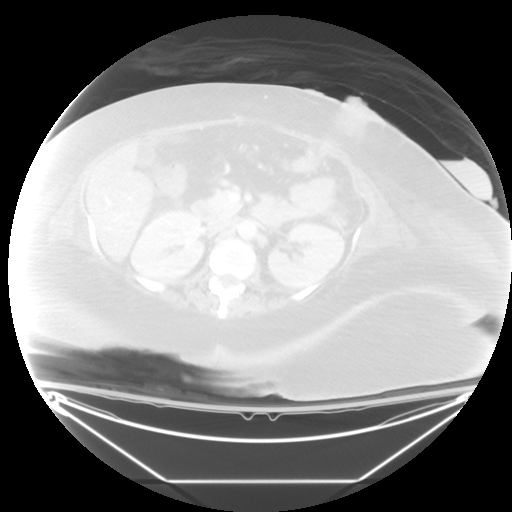
[im 79/102  soft-tissue]
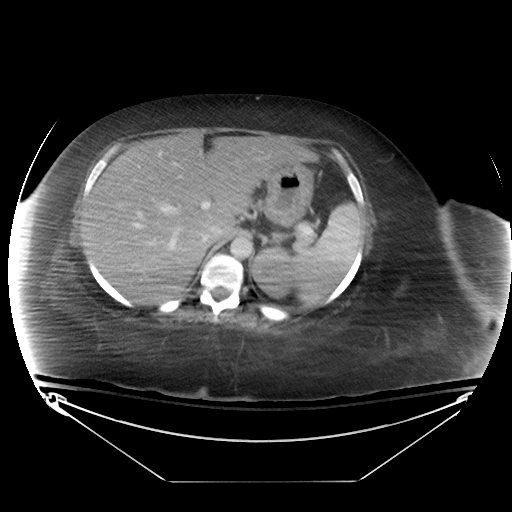
[im 79/102  lung]
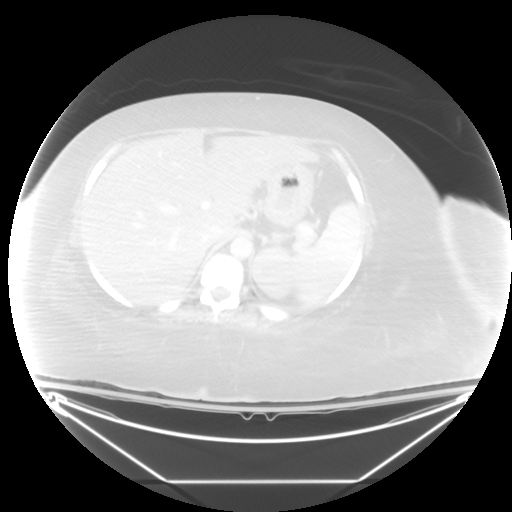
[im 90/102  soft-tissue]
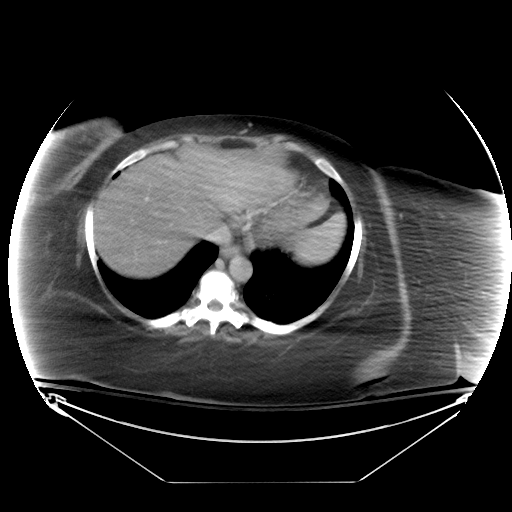
[im 90/102  lung]
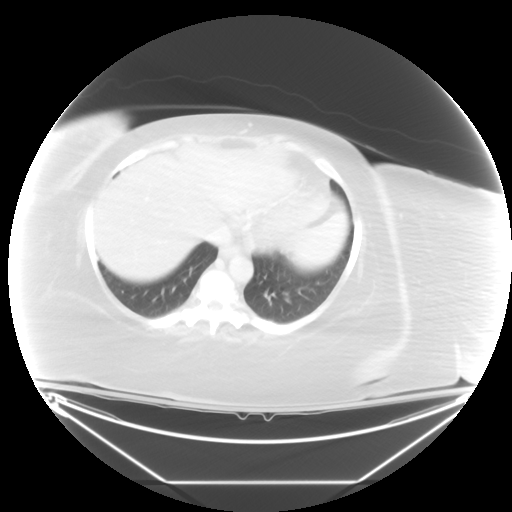

[Series 601: coronal body · coronal · 0.99mm/px · 1 of 147 slices shown, 2 images]
[im 49/147  soft-tissue]
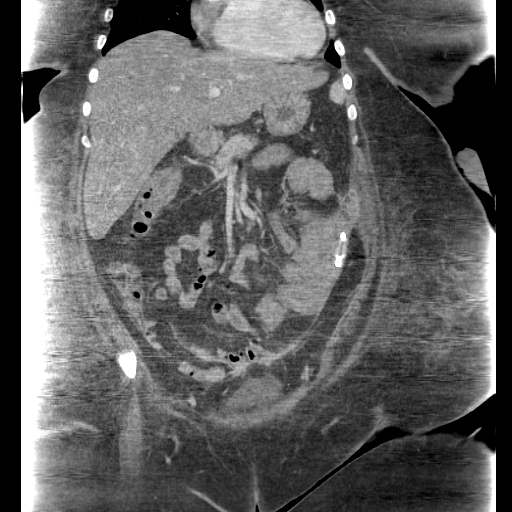
[im 49/147  bone]
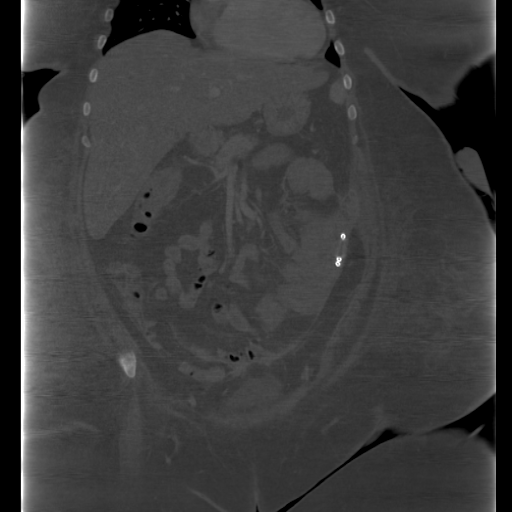

[Series 602: sagittal body · sagittal · 0.99mm/px · 3 of 198 slices shown]
[im 22/198  soft-tissue]
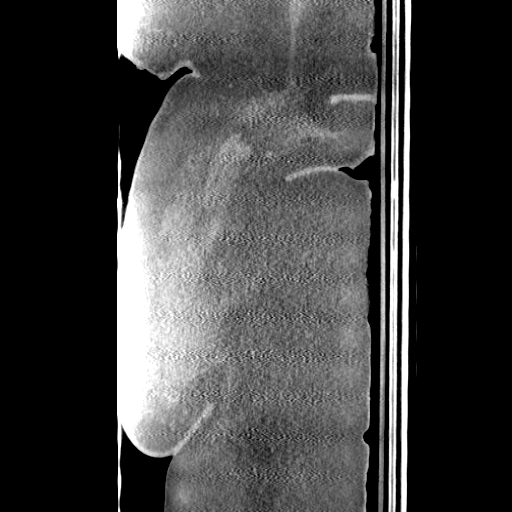
[im 44/198  soft-tissue]
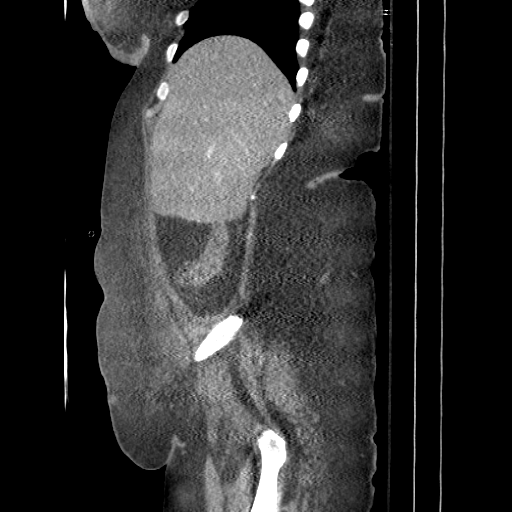
[im 66/198  soft-tissue]
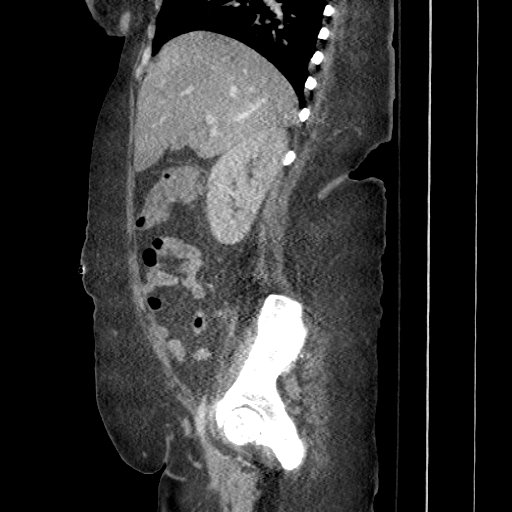

[12 of 36 positions shown; findings below may reference images not displayed]

FINDINGS: Lower chest:

Unremarkable appearance of the soft tissues of the chest wall.

Heart size within normal limits.  No pericardial fluid/thickening.

No lower mediastinal adenopathy.

Unremarkable appearance of the distal esophagus.

No hiatal hernia.

No confluent airspace disease, pleural fluid, or pneumothorax within
visualized lung.

Abdomen/pelvis:

Cranial caudal span of liver measures 21 cm.

Unremarkable appearance of spleen.

Unremarkable appearance of bilateral adrenal glands.

No peripancreatic or pericholecystic fluid or inflammatory changes.

No radio-opaque gallstones.

Surgical drain within the left lower quadrant adjacent to small
bowel. No residual fluid collection adjacent to the drain. Adjacent
postsurgical/post inflammatory changes of the fat.

Colostomy of the left lower quadrant.

No evidence of bowel obstruction.

No free air or significant intraperitoneal free fluid.

Wound along the anterior abdominal midline, secondary intention
healing. Surgical wound VAC within the defect of the anterior
abdominal wall.

Right Kidney/Ureter:

No hydronephrosis. No nephrolithiasis. No perinephric stranding.
Unremarkable course of the right ureter.

Left Kidney/Ureter:

No hydronephrosis. No nephrolithiasis. No perinephric stranding.

Unremarkable course of the left ureter.

Unremarkable appearance of the urinary bladder.

Unremarkable appearance of the adnexa

No significant vascular calcification. No aneurysm or periaortic
fluid identified.

Musculoskeletal:

No displaced fracture identified.

No significant degenerative changes of the spine.
IMPRESSION: Surgical changes of left colectomy with ostomy in the anterior
abdominal wall.

Pigtail catheter drain within the left abdomen with no residual
adjacent fluid collection.

Midline abdominal wound with packing material in the defect.

Hepatomegaly.

## 2016-12-24 ENCOUNTER — Other Ambulatory Visit: Payer: Self-pay | Admitting: Internal Medicine

## 2016-12-24 DIAGNOSIS — K50018 Crohn's disease of small intestine with other complication: Secondary | ICD-10-CM

## 2016-12-25 ENCOUNTER — Inpatient Hospital Stay (HOSPITAL_COMMUNITY)
Admission: EM | Admit: 2016-12-25 | Discharge: 2016-12-29 | DRG: 392 | Disposition: A | Discharge status: Home / Self Care | Payer: Medicaid Other | Attending: Internal Medicine | Admitting: Internal Medicine

## 2016-12-25 ENCOUNTER — Encounter (HOSPITAL_COMMUNITY): Payer: Self-pay

## 2016-12-25 ENCOUNTER — Emergency Department (HOSPITAL_COMMUNITY): Payer: Medicaid Other

## 2016-12-25 DIAGNOSIS — IMO0002 Reserved for concepts with insufficient information to code with codable children: Secondary | ICD-10-CM | POA: Diagnosis present

## 2016-12-25 DIAGNOSIS — I2510 Atherosclerotic heart disease of native coronary artery without angina pectoris: Secondary | ICD-10-CM | POA: Diagnosis present

## 2016-12-25 DIAGNOSIS — G4733 Obstructive sleep apnea (adult) (pediatric): Secondary | ICD-10-CM | POA: Diagnosis present

## 2016-12-25 DIAGNOSIS — E662 Morbid (severe) obesity with alveolar hypoventilation: Secondary | ICD-10-CM | POA: Diagnosis present

## 2016-12-25 DIAGNOSIS — B9620 Unspecified Escherichia coli [E. coli] as the cause of diseases classified elsewhere: Secondary | ICD-10-CM | POA: Diagnosis present

## 2016-12-25 DIAGNOSIS — Z8249 Family history of ischemic heart disease and other diseases of the circulatory system: Secondary | ICD-10-CM

## 2016-12-25 DIAGNOSIS — F17210 Nicotine dependence, cigarettes, uncomplicated: Secondary | ICD-10-CM | POA: Diagnosis present

## 2016-12-25 DIAGNOSIS — J45909 Unspecified asthma, uncomplicated: Secondary | ICD-10-CM | POA: Diagnosis present

## 2016-12-25 DIAGNOSIS — I10 Essential (primary) hypertension: Secondary | ICD-10-CM | POA: Diagnosis present

## 2016-12-25 DIAGNOSIS — Z9049 Acquired absence of other specified parts of digestive tract: Secondary | ICD-10-CM | POA: Diagnosis not present

## 2016-12-25 DIAGNOSIS — K5720 Diverticulitis of large intestine with perforation and abscess without bleeding: Principal | ICD-10-CM

## 2016-12-25 DIAGNOSIS — Z79899 Other long term (current) drug therapy: Secondary | ICD-10-CM

## 2016-12-25 DIAGNOSIS — D571 Sickle-cell disease without crisis: Secondary | ICD-10-CM | POA: Diagnosis present

## 2016-12-25 DIAGNOSIS — R109 Unspecified abdominal pain: Secondary | ICD-10-CM | POA: Diagnosis not present

## 2016-12-25 DIAGNOSIS — D649 Anemia, unspecified: Secondary | ICD-10-CM

## 2016-12-25 DIAGNOSIS — K50119 Crohn's disease of large intestine with unspecified complications: Secondary | ICD-10-CM | POA: Diagnosis not present

## 2016-12-25 DIAGNOSIS — K651 Peritoneal abscess: Secondary | ICD-10-CM | POA: Diagnosis not present

## 2016-12-25 DIAGNOSIS — D473 Essential (hemorrhagic) thrombocythemia: Secondary | ICD-10-CM | POA: Diagnosis present

## 2016-12-25 DIAGNOSIS — Z933 Colostomy status: Secondary | ICD-10-CM | POA: Diagnosis not present

## 2016-12-25 DIAGNOSIS — K50118 Crohn's disease of large intestine with other complication: Secondary | ICD-10-CM | POA: Diagnosis present

## 2016-12-25 DIAGNOSIS — R7989 Other specified abnormal findings of blood chemistry: Secondary | ICD-10-CM | POA: Diagnosis present

## 2016-12-25 DIAGNOSIS — D509 Iron deficiency anemia, unspecified: Secondary | ICD-10-CM | POA: Diagnosis not present

## 2016-12-25 DIAGNOSIS — K760 Fatty (change of) liver, not elsewhere classified: Secondary | ICD-10-CM

## 2016-12-25 DIAGNOSIS — Z6841 Body Mass Index (BMI) 40.0 and over, adult: Secondary | ICD-10-CM

## 2016-12-25 DIAGNOSIS — F419 Anxiety disorder, unspecified: Secondary | ICD-10-CM | POA: Diagnosis present

## 2016-12-25 DIAGNOSIS — K5010 Crohn's disease of large intestine without complications: Secondary | ICD-10-CM | POA: Diagnosis present

## 2016-12-25 DIAGNOSIS — L0390 Cellulitis, unspecified: Secondary | ICD-10-CM | POA: Diagnosis present

## 2016-12-25 DIAGNOSIS — K5732 Diverticulitis of large intestine without perforation or abscess without bleeding: Secondary | ICD-10-CM | POA: Diagnosis present

## 2016-12-25 DIAGNOSIS — Z825 Family history of asthma and other chronic lower respiratory diseases: Secondary | ICD-10-CM | POA: Diagnosis not present

## 2016-12-25 DIAGNOSIS — D573 Sickle-cell trait: Secondary | ICD-10-CM | POA: Diagnosis present

## 2016-12-25 DIAGNOSIS — Z880 Allergy status to penicillin: Secondary | ICD-10-CM

## 2016-12-25 DIAGNOSIS — Z1623 Resistance to quinolones and fluoroquinolones: Secondary | ICD-10-CM | POA: Diagnosis present

## 2016-12-25 DIAGNOSIS — G629 Polyneuropathy, unspecified: Secondary | ICD-10-CM | POA: Diagnosis present

## 2016-12-25 DIAGNOSIS — Z91018 Allergy to other foods: Secondary | ICD-10-CM

## 2016-12-25 DIAGNOSIS — I272 Pulmonary hypertension, unspecified: Secondary | ICD-10-CM | POA: Diagnosis present

## 2016-12-25 LAB — CBC WITH DIFFERENTIAL/PLATELET
Basophils Absolute: 0 10*3/uL (ref 0.0–0.1)
Basophils Relative: 0 %
EOS PCT: 3 %
Eosinophils Absolute: 0.3 10*3/uL (ref 0.0–0.7)
HEMATOCRIT: 28 % — AB (ref 36.0–46.0)
HEMOGLOBIN: 8.1 g/dL — AB (ref 12.0–15.0)
LYMPHS PCT: 23 %
Lymphs Abs: 2 10*3/uL (ref 0.7–4.0)
MCH: 19.4 pg — ABNORMAL LOW (ref 26.0–34.0)
MCHC: 28.9 g/dL — ABNORMAL LOW (ref 30.0–36.0)
MCV: 67.1 fL — AB (ref 78.0–100.0)
MONOS PCT: 10 %
Monocytes Absolute: 0.9 10*3/uL (ref 0.1–1.0)
NEUTROS PCT: 64 %
Neutro Abs: 5.4 10*3/uL (ref 1.7–7.7)
PLATELETS: 423 10*3/uL — AB (ref 150–400)
RBC: 4.17 MIL/uL (ref 3.87–5.11)
RDW: 17.8 % — ABNORMAL HIGH (ref 11.5–15.5)
WBC: 8.6 10*3/uL (ref 4.0–10.5)

## 2016-12-25 LAB — COMPREHENSIVE METABOLIC PANEL
ALT: 9 U/L — ABNORMAL LOW (ref 14–54)
ANION GAP: 7 (ref 5–15)
AST: 11 U/L — ABNORMAL LOW (ref 15–41)
Albumin: 2.8 g/dL — ABNORMAL LOW (ref 3.5–5.0)
Alkaline Phosphatase: 59 U/L (ref 38–126)
BUN: 15 mg/dL (ref 6–20)
CHLORIDE: 105 mmol/L (ref 101–111)
CO2: 27 mmol/L (ref 22–32)
Calcium: 8.6 mg/dL — ABNORMAL LOW (ref 8.9–10.3)
Creatinine, Ser: 1.03 mg/dL — ABNORMAL HIGH (ref 0.44–1.00)
GFR calc non Af Amer: >60 mL/min (ref >60)
Glucose, Bld: 96 mg/dL (ref 65–99)
Potassium: 4.1 mmol/L (ref 3.5–5.1)
SODIUM: 139 mmol/L (ref 135–145)
Total Bilirubin: 0.4 mg/dL (ref 0.3–1.2)
Total Protein: 7.3 g/dL (ref 6.5–8.1)

## 2016-12-25 LAB — I-STAT BETA HCG BLOOD, ED (MC, WL, AP ONLY): I-stat hCG, quantitative: <5 m[IU]/mL (ref <5)

## 2016-12-25 LAB — LIPASE, BLOOD: LIPASE: 15 U/L (ref 11–51)

## 2016-12-25 LAB — RETICULOCYTES
RBC.: 3.82 MIL/uL — ABNORMAL LOW (ref 3.87–5.11)
Retic Count, Absolute: 42 10*3/uL (ref 19.0–186.0)
Retic Ct Pct: 1.1 % (ref 0.4–3.1)

## 2016-12-25 LAB — URINALYSIS, ROUTINE W REFLEX MICROSCOPIC
Bilirubin Urine: NEGATIVE
Glucose, UA: NEGATIVE mg/dL
Hgb urine dipstick: NEGATIVE
Ketones, ur: NEGATIVE mg/dL
Leukocytes, UA: NEGATIVE
NITRITE: NEGATIVE
Protein, ur: NEGATIVE mg/dL
SPECIFIC GRAVITY, URINE: 1.013 (ref 1.005–1.030)
pH: 5 (ref 5.0–8.0)

## 2016-12-25 LAB — FERRITIN: Ferritin: 7 ng/mL — ABNORMAL LOW (ref 11–307)

## 2016-12-25 LAB — IRON AND TIBC
IRON: 13 ug/dL — AB (ref 28–170)
SATURATION RATIOS: 4 % — AB (ref 10.4–31.8)
TIBC: 302 ug/dL (ref 250–450)
UIBC: 289 ug/dL

## 2016-12-25 MED ORDER — ONDANSETRON HCL 4 MG PO TABS: 4.0000 mg | ORAL_TABLET | Freq: Four times a day (QID) | ORAL | Status: DC | PRN

## 2016-12-25 MED ORDER — ENOXAPARIN SODIUM 40 MG/0.4ML ~~LOC~~ SOLN: 40.0000 mg | SUBCUTANEOUS | Status: DC

## 2016-12-25 MED ORDER — IOPAMIDOL (ISOVUE-300) INJECTION 61%
INTRAVENOUS | Status: AC
  Filled 2016-12-25: qty 100

## 2016-12-25 MED ORDER — IPRATROPIUM-ALBUTEROL 0.5-2.5 (3) MG/3ML IN SOLN: 3.0000 mL | Freq: Four times a day (QID) | RESPIRATORY_TRACT | Status: DC | PRN

## 2016-12-25 MED ORDER — KETOROLAC TROMETHAMINE 30 MG/ML IJ SOLN: 30.0000 mg | Freq: Four times a day (QID) | INTRAMUSCULAR | Status: DC | PRN

## 2016-12-25 MED ORDER — CIPROFLOXACIN IN D5W 400 MG/200ML IV SOLN
400.0000 mg | Freq: Two times a day (BID) | INTRAVENOUS | Status: DC
  Administered 2016-12-25 – 2016-12-28 (×6): 400 mg via INTRAVENOUS
  Filled 2016-12-25 (×6): qty 200

## 2016-12-25 MED ORDER — HEPARIN SODIUM (PORCINE) 5000 UNIT/ML IJ SOLN
5000.0000 [IU] | Freq: Three times a day (TID) | INTRAMUSCULAR | Status: DC
  Administered 2016-12-26 – 2016-12-29 (×8): 5000 [IU] via SUBCUTANEOUS
  Filled 2016-12-25 (×8): qty 1

## 2016-12-25 MED ORDER — KETOROLAC TROMETHAMINE 30 MG/ML IJ SOLN
30.0000 mg | Freq: Four times a day (QID) | INTRAMUSCULAR | Status: AC
  Administered 2016-12-25 – 2016-12-26 (×4): 30 mg via INTRAVENOUS
  Filled 2016-12-25 (×4): qty 1

## 2016-12-25 MED ORDER — OXYCODONE HCL 5 MG PO TABS
5.0000 mg | ORAL_TABLET | ORAL | Status: DC
  Administered 2016-12-25 – 2016-12-29 (×20): 5 mg via ORAL
  Filled 2016-12-25 (×21): qty 1

## 2016-12-25 MED ORDER — POTASSIUM CHLORIDE IN NACL 20-0.9 MEQ/L-% IV SOLN
INTRAVENOUS | Status: DC
  Administered 2016-12-25 – 2016-12-27 (×4): via INTRAVENOUS
  Administered 2016-12-28: 1000 mL via INTRAVENOUS
  Filled 2016-12-25 (×7): qty 1000

## 2016-12-25 MED ORDER — SODIUM CHLORIDE 0.9 % IV BOLUS (SEPSIS)
1000.0000 mL | Freq: Once | INTRAVENOUS | Status: AC
  Administered 2016-12-25: 1000 mL via INTRAVENOUS

## 2016-12-25 MED ORDER — HEPARIN SODIUM (PORCINE) 5000 UNIT/ML IJ SOLN
5000.0000 [IU] | Freq: Three times a day (TID) | INTRAMUSCULAR | Status: DC
  Administered 2016-12-25: 5000 [IU] via SUBCUTANEOUS
  Filled 2016-12-25: qty 1

## 2016-12-25 MED ORDER — METRONIDAZOLE IN NACL 5-0.79 MG/ML-% IV SOLN
500.0000 mg | Freq: Once | INTRAVENOUS | Status: AC
  Administered 2016-12-25: 500 mg via INTRAVENOUS
  Filled 2016-12-25: qty 100

## 2016-12-25 MED ORDER — ONDANSETRON HCL 4 MG/2ML IJ SOLN
4.0000 mg | Freq: Four times a day (QID) | INTRAMUSCULAR | Status: DC | PRN
  Administered 2016-12-27: 4 mg via INTRAVENOUS
  Filled 2016-12-25: qty 2

## 2016-12-25 MED ORDER — METRONIDAZOLE IN NACL 5-0.79 MG/ML-% IV SOLN
500.0000 mg | Freq: Three times a day (TID) | INTRAVENOUS | Status: DC
  Administered 2016-12-25 – 2016-12-28 (×9): 500 mg via INTRAVENOUS
  Filled 2016-12-25 (×9): qty 100

## 2016-12-25 MED ORDER — HYDROMORPHONE HCL 1 MG/ML IJ SOLN: 1.0000 mg | INTRAMUSCULAR | Status: DC | PRN

## 2016-12-25 MED ORDER — HYDROMORPHONE HCL 1 MG/ML IJ SOLN: 1.0000 mg | Freq: Once | INTRAMUSCULAR | Status: DC

## 2016-12-25 MED ORDER — ACETAMINOPHEN 325 MG PO TABS: 650.0000 mg | ORAL_TABLET | Freq: Four times a day (QID) | ORAL | Status: DC | PRN

## 2016-12-25 MED ORDER — HYDROMORPHONE HCL 1 MG/ML IJ SOLN
1.0000 mg | Freq: Once | INTRAMUSCULAR | Status: AC
  Administered 2016-12-25: 1 mg via INTRAVENOUS
  Filled 2016-12-25: qty 1

## 2016-12-25 MED ORDER — IOPAMIDOL (ISOVUE-300) INJECTION 61%
100.0000 mL | Freq: Once | INTRAVENOUS | Status: AC | PRN
  Administered 2016-12-25: 100 mL via INTRAVENOUS

## 2016-12-25 MED ORDER — OXYCODONE HCL 5 MG PO TABS
5.0000 mg | ORAL_TABLET | ORAL | Status: DC | PRN
  Administered 2016-12-25: 5 mg via ORAL
  Filled 2016-12-25: qty 1

## 2016-12-25 MED ORDER — ACETAMINOPHEN 650 MG RE SUPP: 650.0000 mg | Freq: Four times a day (QID) | RECTAL | Status: DC | PRN

## 2016-12-25 MED ORDER — DEXTROSE 5 % IV SOLN
2.0000 g | Freq: Once | INTRAVENOUS | Status: AC
  Administered 2016-12-25: 2 g via INTRAVENOUS
  Filled 2016-12-25: qty 2

## 2016-12-25 NOTE — Consult Note (Signed)
Chief Complaint: Patient was seen in consultation today for abdominal pain  Referring Physician(s):  Dr. Debbe Odea  Supervising Physician: Corrie Mckusick  Patient Status: [facility] - In-pt  History of Present Illness: Dominique [name] is a 36 y.o. female with past medical history of Crohn's colitis s/p partial colectomy and colostomy placement, CHF, CAD, headache, HTN, neuropathy and diverticulosis who presents with diverticular abscess.   IR consulted for percutaneous aspiration and drainage.  CT Abd/Pelvis shows: 1. Acute diverticulitis involving the distal transverse colon near its entry point into the left lower quadrant colostomy. There is associated marked inflammation of the subcutaneous fat, cellulitis and a 3.5 cm diverticular abscess which is intraabdominal. 2. No other significant intra-abdominal findings.  Dr. Earleen Newport has reviewed case and feels patient is appropriate for procedure.   Past Medical History:  Diagnosis Date  . Acute acalculous cholecystitis 11/16/2014  . Anemia   . Anxiety   . Asthma   . CHF (congestive heart failure) ([place])   . Complication of anesthesia    states she had a problem staying awake after her c-section, was [facility] to [facility]  . Coronary artery disease   . Diverticulitis of colon 11/17/2014  . Headache    used to have migraines  . Hepatic steatosis 11/17/2014  . Hypertension   . Morbid obesity ([place]) 03/22/2009   Qualifier: Diagnosis of  By: Ronnald Ramp CMA, Chemira    . Neuropathy ([place])   . OSA (obstructive sleep apnea) 05/02/2014  . Seasonal allergies 06/13/2014  . Sickle cell anemia ([place])    She states she " has the trait" (07/20/15)  . Sleep apnea    uses Bipap  . Thyromegaly 05/02/2014    Past Surgical History:  Procedure Laterality Date  . c section 2011  2011  . COLONOSCOPY WITH PROPOFOL N/A 12/27/2015   Procedure: COLONOSCOPY WITH PROPOFOL;  Surgeon: Wonda Horner, MD;  Location: [facility] ENDOSCOPY;  Service: Endoscopy;   Laterality: N/A;  . COLOSTOMY N/A 06/11/2015   Procedure:  CREATION OF COLOSTOMY;  Surgeon: Georganna Skeans, MD;  Location: [place];  Service: General;  Laterality: N/A;  . ESOPHAGOGASTRODUODENOSCOPY N/A 07/19/2015   Procedure: ESOPHAGOGASTRODUODENOSCOPY (EGD);  Surgeon: Clarene Essex, MD;  Location: [facility] ENDOSCOPY;  Service: Endoscopy;  Laterality: N/A;  . FLEXIBLE SIGMOIDOSCOPY N/A 06/05/2015   Procedure: FLEXIBLE SIGMOIDOSCOPY;  Surgeon: Ronald Lobo, MD;  Location: [facility] ENDOSCOPY;  Service: Endoscopy;  Laterality: N/A;  . LAPAROTOMY N/A 06/09/2015   Procedure: EXPLORATORY LAPAROTOMY, DRAINAGE OF INTRAABDOMINAL ABSCESS, PARTIAL COLON RESECTION,  APPLICATION OF WOUND VAC;  Surgeon: Georganna Skeans, MD;  Location: [place];  Service: General;  Laterality: N/A;  . LAPAROTOMY N/A 06/11/2015   Procedure: RE-EXPLORATION OF ABDOMEN;  Surgeon: Georganna Skeans, MD;  Location: [place];  Service: General;  Laterality: N/A;    Allergies: Other and Penicillins  Medications: Prior to Admission medications   Medication Sig Start Date End Date Taking? Authorizing Provider  acetaminophen (TYLENOL) 500 MG tablet Take 1,000 mg by mouth every 6 (six) hours as needed for mild pain.   Yes Historical Provider, MD  ibuprofen (ADVIL,MOTRIN) 200 MG tablet Take 400 mg by mouth every 6 (six) hours as needed for moderate pain.   Yes Historical Provider, MD  inFLIXimab (REMICADE) 100 MG injection Inject 100 mg into the vein as directed.   Yes Historical Provider, MD  PROAIR HFA 108 (90 BASE) MCG/ACT inhaler INHALE 2 PUFFS BY MOUTH INTO THE LUNGS EVERY 6 HOURS AS NEEDED FOR WHEEZING OR SHORTNESS  OF BREATH 02/17/15  Yes Midge Minium, MD  methocarbamol (ROBAXIN) 500 MG tablet Take 1 tablet (500 mg total) by mouth every 6 (six) hours as needed for muscle spasms. Patient not taking: Reported on 12/25/2016 07/25/15   Reyne Dumas, MD     Family History  Problem Relation Age of Onset  . Hypertension Mother   . Allergies Mother   .  Hypertension Father   . Cancer Paternal Grandmother     lung  . Hypertension Sister   . Asthma Brother   . Diabetes Maternal Grandmother   . Hypertension Maternal Grandmother   . Hypertension Maternal Grandfather   . Emphysema Paternal Grandfather     Social History   Social History  . Marital status: Single    Spouse name: N/A  . Number of children: N/A  . Years of education: N/A   Social History Main Topics  . Smoking status: Former Smoker    Packs/day: 0.50    Years: 4.00    Types: Cigarettes    Start date: 07/24/2014    Quit date: 05/27/2015  . Smokeless tobacco: Never Used  . Alcohol use No  . Drug use: No  . Sexual activity: Not Asked   Other Topics Concern  . None   Social History Narrative  . None    Review of Systems  Constitutional: Negative for fatigue and fever.  Respiratory: Negative for cough and shortness of breath.   Cardiovascular: Negative for chest pain.  Gastrointestinal: Positive for abdominal pain.  Psychiatric/Behavioral: Negative for behavioral problems and confusion.    Vital Signs: BP (!) 145/78 (BP Location: Left Arm)   Pulse 91   Temp 98.6 F (37 C) (Oral)   Resp 20   Ht 5' 2"  (1.575 m)   Wt (!) 403 lb 3.2 oz (182.9 kg)   LMP 12/10/2016 Comment: negative beta HCG 12/25/16  SpO2 100%   BMI 73.75 kg/m   Physical Exam  Constitutional: She is oriented to person, place, and time. She appears well-developed.  Cardiovascular: Normal rate, regular rhythm and normal heart sounds.   Pulmonary/Chest: Effort normal and breath sounds normal. No respiratory distress.  Abdominal: Soft. There is tenderness.  Neurological: She is alert and oriented to person, place, and time.  Skin: Skin is warm and dry.  Psychiatric: She has a normal mood and affect. Her behavior is normal. Judgment and thought content normal.  Nursing note and vitals reviewed.   Mallampati Score:  MD Evaluation Airway: WNL Heart: WNL Abdomen: WNL Chest/ Lungs:  WNL ASA  Classification: 3 Mallampati/Airway Score: Two  Imaging: Ct Abdomen Pelvis W Contrast  Result Date: 12/25/2016 CLINICAL DATA:  Pain around colostomy site. History of diverticulitis. Sickle cell disease. EXAM: CT ABDOMEN AND PELVIS WITH CONTRAST TECHNIQUE: Multidetector CT imaging of the abdomen and pelvis was performed using the standard protocol following bolus administration of intravenous contrast. CONTRAST:  164m ISOVUE-300 IOPAMIDOL (ISOVUE-300) INJECTION 61% COMPARISON:  08/08/2015 FINDINGS: Examination is limited due to morbid obesity. Lower chest: The lung bases are clear. Mild stable cardiac enlargement but no pericardial effusion. Hepatobiliary: No focal hepatic lesions or intrahepatic biliary dilatation. Gallbladder is grossly normal. No common bile duct dilatation. Pancreas: Grossly normal.  No findings for acute pancreatitis. Spleen: Within normal limits in size.  No focal lesions. Adrenals/Urinary Tract: The adrenal glands and kidneys are grossly normal. No renal lesions or hydronephrosis. Stomach/Bowel: The stomach, duodenum, small bowel and colon are grossly normal. The the patient does have a left lower quadrant  colostomy and there appears to be significant inflammation in the fat around the colostomy site suggesting cellulitis. There is also an abscess located along the medial and superior aspect of the colostomy site. The distal transverse colon appears inflamed with wall thickening and submucosal edema. This is likely diverticulitis as it enters the colostomy site with a adjacent diverticular abscess. Knee sigmoid colon/ Hartmann's pouch appears normal. No acute inflammatory changes. The ascending colon is grossly normal. No small bowel abnormality is identified. No obstructive findings. Vascular/Lymphatic: The aorta and branch vessels appear normal. The major venous structures are patent. The no mesenteric or retroperitoneal mass or adenopathy. Small scattered lymph nodes are  stable. Reproductive: The uterus and ovaries are unremarkable. Other: No free air. No pelvic fluid collections. Stable anterior abdominal wall hernia containing fat in the transverse colon. No inguinal mass or hernia. No subcutaneous lesions Musculoskeletal: No significant bony findings. IMPRESSION: 1. Acute diverticulitis involving the distal transverse colon near its entry point into the left lower quadrant colostomy. There is associated marked inflammation of the subcutaneous fat, cellulitis and a 3.5 cm diverticular abscess which is intraabdominal. 2. No other significant intra-abdominal findings. Electronically Signed   By: Marijo Sanes M.D.   On: 12/25/2016 07:56    Labs:  CBC:  Recent Labs  12/25/16 0625  WBC 8.6  HGB 8.1*  HCT 28.0*  PLT 423*    COAGS: No results for input(s): INR, APTT in the last 8760 hours.  BMP:  Recent Labs  12/25/16 0625  NA 139  K 4.1  CL 105  CO2 27  GLUCOSE 96  BUN 15  CALCIUM 8.6*  CREATININE 1.03*  GFRNONAA >60  GFRAA >60    LIVER FUNCTION TESTS:  Recent Labs  12/25/16 0625  BILITOT 0.4  AST 11*  ALT 9*  ALKPHOS 59  PROT 7.3  ALBUMIN 2.8*    TUMOR MARKERS: No results for input(s): AFPTM, CEA, CA199, CHROMGRNA in the last 8760 hours.  Assessment and Plan: Patient with history of Crohn's colitis s/p partial colectomy and colostomy placement presents with diverticular abscess.  IR consulted for percutaneous drainage.  Patient has had previous drain placed in IR in Oct 2016.  She states the drain remained in placed for 1-2 months prior to being removed.  She will be made NPO after midnight.   Hold heparin.  Risks and benefits discussed with the patient including bleeding, infection, damage to adjacent structures, bowel perforation/fistula connection, and sepsis. All of the patient's questions were answered, patient is agreeable to proceed. Consent signed and in chart.  Thank you for this interesting consult.  I greatly  enjoyed meeting Willetta York and look forward to participating in their care.  A copy of this report was sent to the requesting provider on this date.  Electronically Signed: Docia Barrier 12/25/2016, 3:29 PM   I spent a total of 40 Minutes    in face to face in clinical consultation, greater than 50% of which was counseling/coordinating care for abdominal abscess.

## 2016-12-25 NOTE — Consult Note (Signed)
[facility] Surgery Consult Note  Dominique [name] 08/26/1981  [id number].    Requesting MD: Tomi Bamberger, MD Chief Complaint/Reason for Consult: diverticulitis with abscess  HPI:  Dominique [name] is a 36 y.o. Female with a PMH including, but not limited to, crohn's disease/crohn's colitis s/p partial colectomy and colostomy placement (06/09/15, 06/10/17 Dr. Georganna Skeans), diverticulitis, anemia, obesity, and OSA who presents to [facility] with a cc abdominal pain. Patient reports her pain started over 2 weeks ago and was dull, non-radiating, upper abdominal pain. The pain improved when she took some prednisone she had leftover from a previous hospitalization. When she ran out of prednisone 2 weeks ago her pain returned and has become gradually worse. She also adds that when she went to her GI doctor one week ago, blood work was significant for "signs of infection" and she was encouraged to go to the ED if her pain worsened. The pain is now described as sharp and located in her LUQ. Ibuprofen did not relieve her pain, bringing her to ED. She denies fever, chills, or changes in bowel function, reporting that she is still having colostomy output. She currently smokes 2-3 cigarettes daily and denies use of EtOH/illicit drugs. She is unemployed and lives with her daughter and her sister.  Crohn's managed by GI physician at [facility], Dr. Erven Colla. Pt currently on remicade and her last dose was Wednesday 12/18/16.   ED workup significant for microcytic anemia (hgb 8.1, hct 28.0) and CT Abd/Pelvis w/ diverticulitis of the transverse colon just proximal to LLQ stoma and assocated inflammation of subcutaneous fat, cellulitis, and 3.5 cm diverticular abscess. Small bowel appears normal. No leukocytosis on CBC.  ROS: Review of Systems  Constitutional: Negative for chills, fever and weight loss.  HENT: Negative.   Eyes: Negative.   Respiratory: Negative for cough, sputum production, shortness of breath and wheezing.    Cardiovascular: Negative for chest pain and palpitations.  Gastrointestinal: Positive for abdominal pain. Negative for blood in stool, constipation, diarrhea, heartburn, melena, nausea and vomiting.  Genitourinary: Negative.   Musculoskeletal: Negative.   Neurological: Negative.     Family History  Problem Relation Age of Onset  . Hypertension Mother   . Allergies Mother   . Hypertension Father   . Cancer Paternal Grandmother     lung  . Hypertension Sister   . Asthma Brother   . Diabetes Maternal Grandmother   . Hypertension Maternal Grandmother   . Hypertension Maternal Grandfather   . Emphysema Paternal Grandfather     Past Medical History:  Diagnosis Date  . Acute acalculous cholecystitis 11/16/2014  . Anemia   . Anxiety   . Asthma   . CHF (congestive heart failure) ([place])   . Complication of anesthesia    states she had a problem staying awake after her c-section, was transferred from [facility] to [facility]  . Coronary artery disease   . Diverticulitis of colon 11/17/2014  . Headache    used to have migraines  . Hepatic steatosis 11/17/2014  . Hypertension   . Morbid obesity ([place]) 03/22/2009   Qualifier: Diagnosis of  By: Ronnald Ramp CMA, [place]    . Neuropathy ([place])   . OSA (obstructive sleep apnea) 05/02/2014  . Seasonal allergies 06/13/2014  . Sickle cell anemia ([place])    She states she " has the trait" (07/20/15)  . Sleep apnea    uses Bipap  . Thyromegaly 05/02/2014    Past Surgical History:  Procedure Laterality Date  . c section 2011  2011  .  COLONOSCOPY WITH PROPOFOL N/A 12/27/2015   Procedure: COLONOSCOPY WITH PROPOFOL;  Surgeon: Wonda Horner, MD;  Location: [facility] ENDOSCOPY;  Service: Endoscopy;  Laterality: N/A;  . COLOSTOMY N/A 06/11/2015   Procedure:  CREATION OF COLOSTOMY;  Surgeon: Georganna Skeans, MD;  Location: [place];  Service: General;  Laterality: N/A;  . ESOPHAGOGASTRODUODENOSCOPY N/A 07/19/2015   Procedure: ESOPHAGOGASTRODUODENOSCOPY (EGD);  Surgeon: Clarene Essex, MD;  Location: [facility] ENDOSCOPY;  Service: Endoscopy;  Laterality: N/A;  . FLEXIBLE SIGMOIDOSCOPY N/A 06/05/2015   Procedure: FLEXIBLE SIGMOIDOSCOPY;  Surgeon: Ronald Lobo, MD;  Location: [facility] ENDOSCOPY;  Service: Endoscopy;  Laterality: N/A;  . LAPAROTOMY N/A 06/09/2015   Procedure: EXPLORATORY LAPAROTOMY, DRAINAGE OF INTRAABDOMINAL ABSCESS, PARTIAL COLON RESECTION,  APPLICATION OF WOUND VAC;  Surgeon: Georganna Skeans, MD;  Location: [place];  Service: General;  Laterality: N/A;  . LAPAROTOMY N/A 06/11/2015   Procedure: RE-EXPLORATION OF ABDOMEN;  Surgeon: Georganna Skeans, MD;  Location: [place];  Service: General;  Laterality: N/A;    Social History:  reports that she quit smoking about 19 months ago. Her smoking use included Cigarettes. She started smoking about 2 years ago. She has a 2.00 pack-year smoking history. She has never used smokeless tobacco. She reports that she does not drink alcohol or use drugs.  Allergies:  Allergies  Allergen Reactions  . Other Shortness Of Breath and Swelling    Tree nuts  . Penicillins Other (See Comments)    Unknown childhood allergy     (Not in a hospital admission)  Blood pressure 113/70, pulse 77, temperature 98.2 F (36.8 C), temperature source Oral, resp. rate 18, last menstrual period 12/10/2016, SpO2 96 %. Physical Exam: Physical Exam  Constitutional: She is oriented to person, place, and time. She appears well-developed and well-nourished. No distress.  Morbidly obese  HENT:  Head: Normocephalic and atraumatic.  Eyes: Right eye exhibits no discharge. Left eye exhibits no discharge. No scleral icterus.  Neck: Normal range of motion. No tracheal deviation present.  Cardiovascular: Normal rate, regular rhythm and normal heart sounds.  Exam reveals no gallop and no friction rub.   No murmur heard. Pulmonary/Chest: Effort normal and breath sounds normal. No respiratory distress. She has no wheezes. She has no rales. She exhibits no tenderness.   Abdominal: Soft. Bowel sounds are normal. She exhibits no distension and no mass. There is tenderness (TTP LUQ medial to stoma. no peritonitis. ). There is no rebound and no guarding. A hernia (umbilical, reducible.) is present.  Colostomy pouch in LUQ. Stoma viable, no surrounding cellulitis/warmth.  Musculoskeletal: She exhibits no edema or deformity.  Neurological: She is alert and oriented to person, place, and time.  Skin: Skin is warm and dry. She is not diaphoretic.   Results for orders placed or performed during the hospital encounter of 12/25/16 (from the past 48 hour(s))  Lipase, blood     Status: None   Collection Time: 12/25/16  6:25 AM  Result Value Ref Range   Lipase 15 11 - 51 U/L  Comprehensive metabolic panel     Status: Abnormal   Collection Time: 12/25/16  6:25 AM  Result Value Ref Range   Sodium 139 135 - 145 mmol/L   Potassium 4.1 3.5 - 5.1 mmol/L   Chloride 105 101 - 111 mmol/L   CO2 27 22 - 32 mmol/L   Glucose, Bld 96 65 - 99 mg/dL   BUN 15 6 - 20 mg/dL   Creatinine, Ser 1.03 (H) 0.44 - 1.00 mg/dL  Calcium 8.6 (L) 8.9 - 10.3 mg/dL   Total Protein 7.3 6.5 - 8.1 g/dL   Albumin 2.8 (L) 3.5 - 5.0 g/dL   AST 11 (L) 15 - 41 U/L   ALT 9 (L) 14 - 54 U/L   Alkaline Phosphatase 59 38 - 126 U/L   Total Bilirubin 0.4 0.3 - 1.2 mg/dL   GFR calc non Af Amer >60 >60 mL/min   GFR calc Af Amer >60 >60 mL/min    Comment: (NOTE) The eGFR has been calculated using the CKD EPI equation. This calculation has not been validated in all clinical situations. eGFR's persistently <60 mL/min signify possible Chronic Kidney Disease.    Anion gap 7 5 - 15  Urinalysis, Routine w reflex microscopic     Status: Abnormal   Collection Time: 12/25/16  6:25 AM  Result Value Ref Range   Color, Urine YELLOW YELLOW   APPearance HAZY (A) CLEAR   Specific Gravity, Urine 1.013 1.005 - 1.030   pH 5.0 5.0 - 8.0   Glucose, UA NEGATIVE NEGATIVE mg/dL   Hgb urine dipstick NEGATIVE NEGATIVE    Bilirubin Urine NEGATIVE NEGATIVE   Ketones, ur NEGATIVE NEGATIVE mg/dL   Protein, ur NEGATIVE NEGATIVE mg/dL   Nitrite NEGATIVE NEGATIVE   Leukocytes, UA NEGATIVE NEGATIVE  CBC with Differential     Status: Abnormal   Collection Time: 12/25/16  6:25 AM  Result Value Ref Range   WBC 8.6 4.0 - 10.5 K/uL   RBC 4.17 3.87 - 5.11 MIL/uL   Hemoglobin 8.1 (L) 12.0 - 15.0 g/dL   HCT 28.0 (L) 36.0 - 46.0 %   MCV 67.1 (L) 78.0 - 100.0 fL   MCH 19.4 (L) 26.0 - 34.0 pg   MCHC 28.9 (L) 30.0 - 36.0 g/dL   RDW 17.8 (H) 11.5 - 15.5 %   Platelets 423 (H) 150 - 400 K/uL   Neutrophils Relative % 64 %   Lymphocytes Relative 23 %   Monocytes Relative 10 %   Eosinophils Relative 3 %   Basophils Relative 0 %   Neutro Abs 5.4 1.7 - 7.7 K/uL   Lymphs Abs 2.0 0.7 - 4.0 K/uL   Monocytes Absolute 0.9 0.1 - 1.0 K/uL   Eosinophils Absolute 0.3 0.0 - 0.7 K/uL   Basophils Absolute 0.0 0.0 - 0.1 K/uL   RBC Morphology POLYCHROMASIA PRESENT   I-Stat beta hCG blood, ED     Status: None   Collection Time: 12/25/16  6:35 AM  Result Value Ref Range   I-stat hCG, quantitative <5.0 <5 mIU/mL   Comment 3            Comment:   GEST. AGE      CONC.  (mIU/mL)   <=1 WEEK        5 - 50     2 WEEKS       50 - 500     3 WEEKS       100 - 10,000     4 WEEKS     1,000 - 30,000        FEMALE AND NON-PREGNANT FEMALE:     LESS THAN 5 mIU/mL    Ct Abdomen Pelvis W Contrast  Result Date: 12/25/2016 CLINICAL DATA:  Pain around colostomy site. History of diverticulitis. Sickle cell disease. EXAM: CT ABDOMEN AND PELVIS WITH CONTRAST TECHNIQUE: Multidetector CT imaging of the abdomen and pelvis was performed using the standard protocol following bolus administration of intravenous contrast. CONTRAST:  166m  ISOVUE-300 IOPAMIDOL (ISOVUE-300) INJECTION 61% COMPARISON:  08/08/2015 FINDINGS: Examination is limited due to morbid obesity. Lower chest: The lung bases are clear. Mild stable cardiac enlargement but no pericardial effusion.  Hepatobiliary: No focal hepatic lesions or intrahepatic biliary dilatation. Gallbladder is grossly normal. No common bile duct dilatation. Pancreas: Grossly normal.  No findings for acute pancreatitis. Spleen: Within normal limits in size.  No focal lesions. Adrenals/Urinary Tract: The adrenal glands and kidneys are grossly normal. No renal lesions or hydronephrosis. Stomach/Bowel: The stomach, duodenum, small bowel and colon are grossly normal. The the patient does have a left lower quadrant colostomy and there appears to be significant inflammation in the fat around the colostomy site suggesting cellulitis. There is also an abscess located along the medial and superior aspect of the colostomy site. The distal transverse colon appears inflamed with wall thickening and submucosal edema. This is likely diverticulitis as it enters the colostomy site with a adjacent diverticular abscess. Knee sigmoid colon/ Hartmann's pouch appears normal. No acute inflammatory changes. The ascending colon is grossly normal. No small bowel abnormality is identified. No obstructive findings. Vascular/Lymphatic: The aorta and branch vessels appear normal. The major venous structures are patent. The no mesenteric or retroperitoneal mass or adenopathy. Small scattered lymph nodes are stable. Reproductive: The uterus and ovaries are unremarkable. Other: No free air. No pelvic fluid collections. Stable anterior abdominal wall hernia containing fat in the transverse colon. No inguinal mass or hernia. No subcutaneous lesions Musculoskeletal: No significant bony findings. IMPRESSION: 1. Acute diverticulitis involving the distal transverse colon near its entry point into the left lower quadrant colostomy. There is associated marked inflammation of the subcutaneous fat, cellulitis and a 3.5 cm diverticular abscess which is intraabdominal. 2. No other significant intra-abdominal findings. Electronically Signed   By: Marijo Sanes M.D.   On:  12/25/2016 07:56   Assessment/Plan Acute diverticulitis of transverse colon with associated abscess  - admit to [facility] hospital for IV fluids, bowel rest, IV antibiotics - IR consultation for percutaneous drainage of diverticular abscess - patient currently on immunosuppressive therapy with remicade and, if she fails to improve with medical management, will be at risk for poor would healing.   Crohn's Disease s/p colectomy/colostomy placement 2016  Obesity  OSA  Sickle cell anemia  HTN  Asthma Anxiety Tobacco use   FEN: NPO, IVF ID: per medicine - recommend rocephin and flagyl OR Zosyn VTE: SCD's, hold Lovenox for possible procedure   Plan: IV abx, bowel rest, IR consult  Jill Alexanders, [facility] Surgery 12/25/2016, 8:53 AM Pager: [phone] Consults: [phone] Mon-Fri 7:00 am-4:30 pm Sat-Sun 7:00 am-11:30 am

## 2016-12-25 NOTE — ED Provider Notes (Signed)
[facility] DEPT Provider Note   CSN: [id number] Arrival date & time: 12/25/16  0347     History   Chief Complaint Chief Complaint  Patient presents with  . Abdominal Pain    HPI Amybeth Oltmann is a 36 y.o. female who presents with abdominal pain. PMH significant for Crohn's disease s/p hemicolectomy and colostomy due to perforated sigmoid colon, hx of diverticulitis, hx of peritoneal abscess, morbid obesity. She states that she has had intermittent pain since she was diagnosed with Crohn's disease in 2016 but it has worsened over the last 2 weeks. It is on the left side of her abdomen and constant. She is on Remicade injections and has been taking Prednisone but ran out 2 weeks ago. She has an appointment with her doctor next week but the pain worsened so she came to the ED. No fever, chills, chest pain, SOB, N/V. She has a colostomy which has had normal output of stool which is non-bloody. No dysuria. She sees GI at [facility] for her Crohn's.    HPI  Past Medical History:  Diagnosis Date  . Acute acalculous cholecystitis 11/16/2014  . Anemia   . Anxiety   . Asthma   . CHF (congestive heart failure) ([place])   . Complication of anesthesia    states she had a problem staying awake after her c-section, was transferred from [facility] to [facility]  . Coronary artery disease   . Diverticulitis of colon 11/17/2014  . Headache    used to have migraines  . Hepatic steatosis 11/17/2014  . Hypertension   . Morbid obesity ([place]) 03/22/2009   Qualifier: Diagnosis of  By: Ronnald Ramp CMA, [place]    . Neuropathy ([place])   . OSA (obstructive sleep apnea) 05/02/2014  . Seasonal allergies 06/13/2014  . Sickle cell anemia ([place])    She states she " has the trait" (07/20/15)  . Sleep apnea    uses Bipap  . Thyromegaly 05/02/2014    Patient Active Problem List   Diagnosis Date Noted  . Hypokalemia 07/21/2015  . Postoperative intra-abdominal abscess ([place])   . Adjustment disorder with mixed anxiety and depressed mood    . Physical deconditioning 06/26/2015  . Severe left ventricular systolic dysfunction 57/26/2035  . Pulmonary hypertension ([place])   . Crohn's disease ([place])   . Perforated sigmoid colon ([place])   . Peritonitis ([place])   . Peritoneal abscess ([place])   . OSA on CPAP   . Obesity hypoventilation syndrome ([place])   . Pressure ulcer 06/16/2015  . Protein-calorie malnutrition, severe ([place]) 06/10/2015  . Gastritis 04/07/2015  . Iron deficiency anemia 04/07/2015  . Sinus tachycardia ([place]) 12/29/2014  . Lumbar back pain 12/06/2014  . Rectal bleeding 11/19/2014  . Diverticulitis of colon 11/17/2014  . Hepatic steatosis 11/17/2014  . Seasonal allergies 06/13/2014  . OSA (obstructive sleep apnea) 05/02/2014  . Thyromegaly 05/02/2014  . AMENORRHEA 02/07/2010  . Morbid obesity- BMI 67.5 03/22/2009    Past Surgical History:  Procedure Laterality Date  . c section 2011  2011  . COLONOSCOPY WITH PROPOFOL N/A 12/27/2015   Procedure: COLONOSCOPY WITH PROPOFOL;  Surgeon: Wonda Horner, MD;  Location: [facility] ENDOSCOPY;  Service: Endoscopy;  Laterality: N/A;  . COLOSTOMY N/A 06/11/2015   Procedure:  CREATION OF COLOSTOMY;  Surgeon: Georganna Skeans, MD;  Location: [facility];  Service: General;  Laterality: N/A;  . ESOPHAGOGASTRODUODENOSCOPY N/A 07/19/2015   Procedure: ESOPHAGOGASTRODUODENOSCOPY (EGD);  Surgeon: Clarene Essex, MD;  Location: [facility] ENDOSCOPY;  Service: Endoscopy;  Laterality: N/A;  .  FLEXIBLE SIGMOIDOSCOPY N/A 06/05/2015   Procedure: FLEXIBLE SIGMOIDOSCOPY;  Surgeon: Ronald Lobo, MD;  Location: [facility] ENDOSCOPY;  Service: Endoscopy;  Laterality: N/A;  . LAPAROTOMY N/A 06/09/2015   Procedure: EXPLORATORY LAPAROTOMY, DRAINAGE OF INTRAABDOMINAL ABSCESS, PARTIAL COLON RESECTION,  APPLICATION OF WOUND VAC;  Surgeon: Georganna Skeans, MD;  Location: [facility];  Service: General;  Laterality: N/A;  . LAPAROTOMY N/A 06/11/2015   Procedure: RE-EXPLORATION OF ABDOMEN;  Surgeon: Georganna Skeans, MD;  Location: [facility];  Service:  General;  Laterality: N/A;    OB History    No data available       Home Medications    Prior to Admission medications   Medication Sig Start Date End Date Taking? Authorizing Provider  acetaminophen (TYLENOL) 500 MG tablet Take 1,000 mg by mouth every 6 (six) hours as needed for mild pain.   Yes Historical Provider, MD  ibuprofen (ADVIL,MOTRIN) 200 MG tablet Take 400 mg by mouth every 6 (six) hours as needed for moderate pain.   Yes Historical Provider, MD  inFLIXimab (REMICADE) 100 MG injection Inject 100 mg into the vein as directed.   Yes Historical Provider, MD  PROAIR HFA 108 (90 BASE) MCG/ACT inhaler INHALE 2 PUFFS BY MOUTH INTO THE LUNGS EVERY 6 HOURS AS NEEDED FOR WHEEZING OR SHORTNESS OF BREATH 02/17/15  Yes Midge Minium, MD  albuterol (PROVENTIL) (2.5 MG/3ML) 0.083% nebulizer solution Take 3 mLs (2.5 mg total) by nebulization every 6 (six) hours as needed for wheezing or shortness of breath. Patient not taking: Reported on 12/25/2016 10/27/14   Rigoberto Noel, MD  ferrous sulfate 325 (65 FE) MG tablet Take 2 tablets (650 mg total) by mouth daily with breakfast. Patient not taking: Reported on 12/25/2016 04/07/15   Midge Minium, MD  methocarbamol (ROBAXIN) 500 MG tablet Take 1 tablet (500 mg total) by mouth every 6 (six) hours as needed for muscle spasms. Patient not taking: Reported on 12/25/2016 07/25/15   Reyne Dumas, MD  metoprolol succinate (TOPROL XL) 50 MG 24 hr tablet Take 1 tablet (50 mg total) by mouth daily. Take with or immediately following a meal. Patient not taking: Reported on 12/25/2016 06/26/15   Verlee Monte, MD  ondansetron (ZOFRAN) 4 MG tablet Take 1 tablet (4 mg total) by mouth every 6 (six) hours. Patient not taking: Reported on 12/25/2016 05/25/15   Hyman Bible, PA-C  oxyCODONE (OXY IR/ROXICODONE) 5 MG immediate release tablet Take 1 tablet (5 mg total) by mouth every 4 (four) hours as needed for moderate pain. Patient not taking: Reported on 12/25/2016  07/25/15   Reyne Dumas, MD  saccharomyces boulardii (FLORASTOR) 250 MG capsule Take 1 capsule (250 mg total) by mouth 2 (two) times daily. Patient not taking: Reported on 12/25/2016 06/26/15   Verlee Monte, MD    Family History Family History  Problem Relation Age of Onset  . Hypertension Mother   . Allergies Mother   . Hypertension Father   . Cancer Paternal Grandmother     lung  . Hypertension Sister   . Asthma Brother   . Diabetes Maternal Grandmother   . Hypertension Maternal Grandmother   . Hypertension Maternal Grandfather   . Emphysema Paternal Grandfather     Social History Social History  Substance Use Topics  . Smoking status: Former Smoker    Packs/day: 0.50    Years: 4.00    Types: Cigarettes    Start date: 07/24/2014    Quit date: 05/27/2015  . Smokeless tobacco: Never Used  .  Alcohol use No     Allergies   Other and Penicillins   Review of Systems Review of Systems  Constitutional: Negative for chills and fever.  Respiratory: Negative for shortness of breath.   Cardiovascular: Negative for chest pain.  Gastrointestinal: Positive for abdominal pain. Negative for diarrhea, nausea and vomiting.  Genitourinary: Negative for dysuria.  All other systems reviewed and are negative.    Physical Exam Updated Vital Signs BP (!) 124/56 (BP Location: Left Wrist)   Pulse 94   Temp 98.2 F (36.8 C) (Oral)   Resp 20   LMP 12/10/2016   SpO2 100%   Physical Exam  Constitutional: She is oriented to person, place, and time. She appears well-developed and well-nourished. No distress.  Morbidly obese, NAD  HENT:  Head: Normocephalic and atraumatic.  Eyes: Conjunctivae are normal. Pupils are equal, round, and reactive to light. Right eye exhibits no discharge. Left eye exhibits no discharge. No scleral icterus.  Neck: Normal range of motion.  Cardiovascular: Normal rate and regular rhythm.  Exam reveals no gallop and no friction rub.   No murmur  heard. Pulmonary/Chest: Effort normal and breath sounds normal. No respiratory distress. She has no wheezes. She has no rales. She exhibits no tenderness.  Abdominal: Soft. Bowel sounds are normal. She exhibits no distension and no mass. There is tenderness (Tenderness around colostomy site). There is no rebound and no guarding. No hernia.  Colostomy on left side of abdomen is intact. Stoma is pink and non-tender  Neurological: She is alert and oriented to person, place, and time.  Skin: Skin is warm and dry.  Psychiatric: She has a normal mood and affect. Her behavior is normal.  Nursing note and vitals reviewed.    ED Treatments / Results  Labs (all labs ordered are listed, but only abnormal results are displayed) Labs Reviewed  COMPREHENSIVE METABOLIC PANEL - Abnormal; Notable for the following:       Result Value   Creatinine, Ser 1.03 (*)    Calcium 8.6 (*)    Albumin 2.8 (*)    AST 11 (*)    ALT 9 (*)    All other components within normal limits  URINALYSIS, ROUTINE W REFLEX MICROSCOPIC - Abnormal; Notable for the following:    APPearance HAZY (*)    All other components within normal limits  CBC WITH DIFFERENTIAL/PLATELET - Abnormal; Notable for the following:    Hemoglobin 8.1 (*)    HCT 28.0 (*)    MCV 67.1 (*)    MCH 19.4 (*)    MCHC 28.9 (*)    RDW 17.8 (*)    Platelets 423 (*)    All other components within normal limits  LIPASE, BLOOD  HIV ANTIBODY (ROUTINE TESTING)  CBC  CREATININE, SERUM  I-STAT BETA HCG BLOOD, ED ([facility], [facility], [facility] ONLY)    EKG  EKG Interpretation None       Radiology Ct Abdomen Pelvis W Contrast  Result Date: 12/25/2016 CLINICAL DATA:  Pain around colostomy site. History of diverticulitis. Sickle cell disease. EXAM: CT ABDOMEN AND PELVIS WITH CONTRAST TECHNIQUE: Multidetector CT imaging of the abdomen and pelvis was performed using the standard protocol following bolus administration of intravenous contrast. CONTRAST:  172m ISOVUE-300  IOPAMIDOL (ISOVUE-300) INJECTION 61% COMPARISON:  08/08/2015 FINDINGS: Examination is limited due to morbid obesity. Lower chest: The lung bases are clear. Mild stable cardiac enlargement but no pericardial effusion. Hepatobiliary: No focal hepatic lesions or intrahepatic biliary dilatation. Gallbladder is grossly normal. No  common bile duct dilatation. Pancreas: Grossly normal.  No findings for acute pancreatitis. Spleen: Within normal limits in size.  No focal lesions. Adrenals/Urinary Tract: The adrenal glands and kidneys are grossly normal. No renal lesions or hydronephrosis. Stomach/Bowel: The stomach, duodenum, small bowel and colon are grossly normal. The the patient does have a left lower quadrant colostomy and there appears to be significant inflammation in the fat around the colostomy site suggesting cellulitis. There is also an abscess located along the medial and superior aspect of the colostomy site. The distal transverse colon appears inflamed with wall thickening and submucosal edema. This is likely diverticulitis as it enters the colostomy site with a adjacent diverticular abscess. Knee sigmoid colon/ Hartmann's pouch appears normal. No acute inflammatory changes. The ascending colon is grossly normal. No small bowel abnormality is identified. No obstructive findings. Vascular/Lymphatic: The aorta and branch vessels appear normal. The major venous structures are patent. The no mesenteric or retroperitoneal mass or adenopathy. Small scattered lymph nodes are stable. Reproductive: The uterus and ovaries are unremarkable. Other: No free air. No pelvic fluid collections. Stable anterior abdominal wall hernia containing fat in the transverse colon. No inguinal mass or hernia. No subcutaneous lesions Musculoskeletal: No significant bony findings. IMPRESSION: 1. Acute diverticulitis involving the distal transverse colon near its entry point into the left lower quadrant colostomy. There is associated marked  inflammation of the subcutaneous fat, cellulitis and a 3.5 cm diverticular abscess which is intraabdominal. 2. No other significant intra-abdominal findings. Electronically Signed   By: Marijo Sanes M.D.   On: 12/25/2016 07:56    Procedures Procedures (including critical care time)  Medications Ordered in ED Medications  iopamidol (ISOVUE-300) 61 % injection (not administered)  ceFEPIme (MAXIPIME) 2 g in dextrose 5 % 50 mL IVPB (not administered)    And  metroNIDAZOLE (FLAGYL) IVPB 500 mg (not administered)  sodium chloride 0.9 % bolus 1,000 mL (1,000 mLs Intravenous New Bag/Given 12/25/16 0649)  HYDROmorphone (DILAUDID) injection 1 mg (1 mg Intravenous Given 12/25/16 0649)  iopamidol (ISOVUE-300) 61 % injection 100 mL (100 mLs Intravenous Contrast Given 12/25/16 0735)     Initial Impression / Assessment and Plan / ED Course  I have reviewed the triage vital signs and the nursing notes.  Pertinent labs & imaging results that were available during my care of the patient were reviewed by me and considered in my medical decision making (see chart for details).  36 year old female presents with intraabdominal abscess due to diverticulitis of the transverse colon. Vitals are normal. CBC remarkable for anemia which is at baseline. CMP overall unremarkable. Duet to high risk hx of intraabdominal infections CT was obtained which shows diverticulitis with abscess. Cefepime and Flagyl ordered due to PCN allergy (unknown reaction). Spoke to [employer] with general surgery who will come to see patient. Spoke with Dr. Wynelle Cleveland who will admit patient.   Final Clinical Impressions(s) / ED Diagnoses   Final diagnoses:  Diverticulitis of large intestine with abscess without bleeding    New Prescriptions New Prescriptions   No medications on file     Recardo Evangelist, PA-C 12/25/16 [place], MD 12/25/16 2302

## 2016-12-25 NOTE — ED Notes (Signed)
Patient transported to CT 

## 2016-12-25 NOTE — H&P (Signed)
History and Physical    Dominique [name] ZMO:[id number] DOB: Mar 19, 1981 DOA: 12/25/2016    PCP: Benito Mccreedy, MD  Patient coming from: home  Chief Complaint: abdominal pain  HPI: Dominique [name] is a 36 y.o. female with medical history significant of Crohn's disease s/p hemicolectomy and colostomy in 2016 due to perforated sigmoid colon, on Remicaide which she last received last week on Wednesday. She began having left sided abdominal pain about 2 wks ago. She took some Prednisone she had left over at home, maybe 1 wks worth and the pain resolved. She spoke with her GI doctor who made her and appt for next Tues and asked her to go to the ER if the pain did not improve. Her pain is 8/10. Does not radiate. Initially was improving with Ibuprofen but this is no longer working. It is worse with movement, coughing and deep breaths. No nausea. Normal stool in colostomy. No fever or chills.   ED Course: CT showing diverticulitis in distal transverse colon near the colostomy and a 3.5 cm abscess.  Afebrile, normal BP/ HR, no leukocytosis.  Hb 8.1  Review of Systems:  All other systems reviewed and apart from HPI, are negative.  Past Medical History:  Diagnosis Date  . Acute acalculous cholecystitis 11/16/2014  . Anemia   . Anxiety   . Asthma   . CHF (congestive heart failure) ([place])   . Complication of anesthesia    states she had a problem staying awake after her c-section, was transferred from [facility] to [facility]  . Coronary artery disease   . Diverticulitis of colon 11/17/2014  . Headache    used to have migraines  . Hepatic steatosis 11/17/2014  . Hypertension   . Morbid obesity ([place]) 03/22/2009   Qualifier: Diagnosis of  By: Ronnald Ramp CMA, [place]    . Neuropathy ([place])   . OSA (obstructive sleep apnea) 05/02/2014  . Seasonal allergies 06/13/2014  . Sickle cell anemia ([place])    She states she " has the trait" (07/20/15)  . Sleep apnea    uses Bipap  . Thyromegaly 05/02/2014    Past Surgical  History:  Procedure Laterality Date  . c section 2011  2011  . COLONOSCOPY WITH PROPOFOL N/A 12/27/2015   Procedure: COLONOSCOPY WITH PROPOFOL;  Surgeon: Wonda Horner, MD;  Location: [facility] ENDOSCOPY;  Service: Endoscopy;  Laterality: N/A;  . COLOSTOMY N/A 06/11/2015   Procedure:  CREATION OF COLOSTOMY;  Surgeon: Georganna Skeans, MD;  Location: [place];  Service: General;  Laterality: N/A;  . ESOPHAGOGASTRODUODENOSCOPY N/A 07/19/2015   Procedure: ESOPHAGOGASTRODUODENOSCOPY (EGD);  Surgeon: Clarene Essex, MD;  Location: [facility] ENDOSCOPY;  Service: Endoscopy;  Laterality: N/A;  . FLEXIBLE SIGMOIDOSCOPY N/A 06/05/2015   Procedure: FLEXIBLE SIGMOIDOSCOPY;  Surgeon: Ronald Lobo, MD;  Location: [facility] ENDOSCOPY;  Service: Endoscopy;  Laterality: N/A;  . LAPAROTOMY N/A 06/09/2015   Procedure: EXPLORATORY LAPAROTOMY, DRAINAGE OF INTRAABDOMINAL ABSCESS, PARTIAL COLON RESECTION,  APPLICATION OF WOUND VAC;  Surgeon: Georganna Skeans, MD;  Location: [place];  Service: General;  Laterality: N/A;  . LAPAROTOMY N/A 06/11/2015   Procedure: RE-EXPLORATION OF ABDOMEN;  Surgeon: Georganna Skeans, MD;  Location: [place];  Service: General;  Laterality: N/A;    Social History:   Started smoking cigarettes 4 yrs ago and has been smoking on and off.  Has been smoking daily since June, a few a day.  She has never used smokeless tobacco. She reports that she does not drink alcohol or use drugs. Lives with Dad and sister, has  a 73 yr old daughter, currently not working.  Allergies  Allergen Reactions  . Other Shortness Of Breath and Swelling    Tree nuts  . Penicillins Other (See Comments)    Unknown childhood allergy    Family History  Problem Relation Age of Onset  . Hypertension Mother   . Allergies Mother   . Hypertension Father   . Cancer Paternal Grandmother     lung  . Hypertension Sister   . Asthma Brother   . Diabetes Maternal Grandmother   . Hypertension Maternal Grandmother   . Hypertension Maternal Grandfather   .  Emphysema Paternal Grandfather      Prior to Admission medications   Medication Sig Start Date End Date Taking? Authorizing Provider  acetaminophen (TYLENOL) 500 MG tablet Take 1,000 mg by mouth every 6 (six) hours as needed for mild pain.   Yes Historical Provider, MD  ibuprofen (ADVIL,MOTRIN) 200 MG tablet Take 400 mg by mouth every 6 (six) hours as needed for moderate pain.   Yes Historical Provider, MD  inFLIXimab (REMICADE) 100 MG injection Inject 100 mg into the vein as directed.   Yes Historical Provider, MD  PROAIR HFA 108 (90 BASE) MCG/ACT inhaler INHALE 2 PUFFS BY MOUTH INTO THE LUNGS EVERY 6 HOURS AS NEEDED FOR WHEEZING OR SHORTNESS OF BREATH 02/17/15  Yes Midge Minium, MD  albuterol (PROVENTIL) (2.5 MG/3ML) 0.083% nebulizer solution Take 3 mLs (2.5 mg total) by nebulization every 6 (six) hours as needed for wheezing or shortness of breath. Patient not taking: Reported on 12/25/2016 10/27/14   Rigoberto Noel, MD  ferrous sulfate 325 (65 FE) MG tablet Take 2 tablets (650 mg total) by mouth daily with breakfast. Patient not taking: Reported on 12/25/2016 04/07/15   Midge Minium, MD  methocarbamol (ROBAXIN) 500 MG tablet Take 1 tablet (500 mg total) by mouth every 6 (six) hours as needed for muscle spasms. Patient not taking: Reported on 12/25/2016 07/25/15   Reyne Dumas, MD  metoprolol succinate (TOPROL XL) 50 MG 24 hr tablet Take 1 tablet (50 mg total) by mouth daily. Take with or immediately following a meal. Patient not taking: Reported on 12/25/2016 06/26/15   Verlee Monte, MD  ondansetron (ZOFRAN) 4 MG tablet Take 1 tablet (4 mg total) by mouth every 6 (six) hours. Patient not taking: Reported on 12/25/2016 05/25/15   Hyman Bible, PA-C  oxyCODONE (OXY IR/ROXICODONE) 5 MG immediate release tablet Take 1 tablet (5 mg total) by mouth every 4 (four) hours as needed for moderate pain. Patient not taking: Reported on 12/25/2016 07/25/15   Reyne Dumas, MD  saccharomyces boulardii (FLORASTOR)  250 MG capsule Take 1 capsule (250 mg total) by mouth 2 (two) times daily. Patient not taking: Reported on 12/25/2016 06/26/15   Verlee Monte, MD    Physical Exam: Vitals:   12/25/16 0647 12/25/16 0700 12/25/16 0947 12/25/16 1030  BP: (!) 110/53 113/70 101/70 (!) 145/78  Pulse: 86 77 79 91  Resp: 20 18 18 20   Temp:    98.6 F (37 C)  TempSrc:    Oral  SpO2: 100% 96% 99% 100%  Weight:    (!) 182.9 kg (403 lb 3.2 oz)  Height:    5' 2"  (1.575 m)      Constitutional: NAD, calm, comfortable Eyes: PERTLA, lids and conjunctivae normal ENMT: Mucous membranes are moist. Posterior pharynx clear of any exudate or lesions. Normal dentition.  Neck: normal, supple, no masses, no thyromegaly Respiratory: clear to auscultation  bilaterally, no wheezing, no crackles. Normal respiratory effort. No accessory muscle use.  Cardiovascular: S1 & S2 heard, regular rate and rhythm, no murmurs / rubs / gallops. No extremity edema. 2+ pedal pulses. No carotid bruits.  Abdomen: No distension, no tenderness, no masses palpated. No hepatosplenomegaly. Bowel sounds normal.  Musculoskeletal: no clubbing / cyanosis. No joint deformity upper and lower extremities. Good ROM, no contractures. Normal muscle tone.  Skin: no rashes, lesions, ulcers. No induration Neurologic: CN 2-12 grossly intact. Sensation intact, DTR normal. Strength 5/5 in all 4 limbs.  Psychiatric: Normal judgment and insight. Alert and oriented x 3. Normal mood.     Labs on Admission: I have personally reviewed following labs and imaging studies  CBC:  Recent Labs Lab 12/25/16 0625  WBC 8.6  NEUTROABS 5.4  HGB 8.1*  HCT 28.0*  MCV 67.1*  PLT 629*   Basic Metabolic Panel:  Recent Labs Lab 12/25/16 0625  NA 139  K 4.1  CL 105  CO2 27  GLUCOSE 96  BUN 15  CREATININE 1.03*  CALCIUM 8.6*   GFR: Estimated Creatinine Clearance: 123 mL/min (A) (by C-G formula based on SCr of 1.03 mg/dL (H)). Liver Function Tests:  Recent  Labs Lab 12/25/16 0625  AST 11*  ALT 9*  ALKPHOS 59  BILITOT 0.4  PROT 7.3  ALBUMIN 2.8*    Recent Labs Lab 12/25/16 0625  LIPASE 15   No results for input(s): AMMONIA in the last 168 hours. Coagulation Profile: No results for input(s): INR, PROTIME in the last 168 hours. Cardiac Enzymes: No results for input(s): CKTOTAL, CKMB, CKMBINDEX, TROPONINI in the last 168 hours. BNP (last 3 results) No results for input(s): PROBNP in the last 8760 hours. HbA1C: No results for input(s): HGBA1C in the last 72 hours. CBG: No results for input(s): GLUCAP in the last 168 hours. Lipid Profile: No results for input(s): CHOL, HDL, LDLCALC, TRIG, CHOLHDL, LDLDIRECT in the last 72 hours. Thyroid Function Tests: No results for input(s): TSH, T4TOTAL, FREET4, T3FREE, THYROIDAB in the last 72 hours. Anemia Panel: No results for input(s): VITAMINB12, FOLATE, FERRITIN, TIBC, IRON, RETICCTPCT in the last 72 hours. Urine analysis:    Component Value Date/Time   COLORURINE YELLOW 12/25/2016 0625   APPEARANCEUR HAZY (A) 12/25/2016 0625   LABSPEC 1.013 12/25/2016 0625   PHURINE 5.0 12/25/2016 0625   GLUCOSEU NEGATIVE 12/25/2016 0625   HGBUR NEGATIVE 12/25/2016 0625   BILIRUBINUR NEGATIVE 12/25/2016 0625   KETONESUR NEGATIVE 12/25/2016 0625   PROTEINUR NEGATIVE 12/25/2016 0625   UROBILINOGEN 0.2 07/22/2015 0405   NITRITE NEGATIVE 12/25/2016 0625   LEUKOCYTESUR NEGATIVE 12/25/2016 0625   Sepsis Labs: @LABRCNTIP (procalcitonin:4,lacticidven:4) )No results found for this or any previous visit (from the past 240 hour(s)).   Radiological Exams on Admission: Ct Abdomen Pelvis W Contrast  Result Date: 12/25/2016 CLINICAL DATA:  Pain around colostomy site. History of diverticulitis. Sickle cell disease. EXAM: CT ABDOMEN AND PELVIS WITH CONTRAST TECHNIQUE: Multidetector CT imaging of the abdomen and pelvis was performed using the standard protocol following bolus administration of intravenous  contrast. CONTRAST:  115m ISOVUE-300 IOPAMIDOL (ISOVUE-300) INJECTION 61% COMPARISON:  08/08/2015 FINDINGS: Examination is limited due to morbid obesity. Lower chest: The lung bases are clear. Mild stable cardiac enlargement but no pericardial effusion. Hepatobiliary: No focal hepatic lesions or intrahepatic biliary dilatation. Gallbladder is grossly normal. No common bile duct dilatation. Pancreas: Grossly normal.  No findings for acute pancreatitis. Spleen: Within normal limits in size.  No focal lesions. Adrenals/Urinary Tract: The  adrenal glands and kidneys are grossly normal. No renal lesions or hydronephrosis. Stomach/Bowel: The stomach, duodenum, small bowel and colon are grossly normal. The the patient does have a left lower quadrant colostomy and there appears to be significant inflammation in the fat around the colostomy site suggesting cellulitis. There is also an abscess located along the medial and superior aspect of the colostomy site. The distal transverse colon appears inflamed with wall thickening and submucosal edema. This is likely diverticulitis as it enters the colostomy site with a adjacent diverticular abscess. Knee sigmoid colon/ Hartmann's pouch appears normal. No acute inflammatory changes. The ascending colon is grossly normal. No small bowel abnormality is identified. No obstructive findings. Vascular/Lymphatic: The aorta and branch vessels appear normal. The major venous structures are patent. The no mesenteric or retroperitoneal mass or adenopathy. Small scattered lymph nodes are stable. Reproductive: The uterus and ovaries are unremarkable. Other: No free air. No pelvic fluid collections. Stable anterior abdominal wall hernia containing fat in the transverse colon. No inguinal mass or hernia. No subcutaneous lesions Musculoskeletal: No significant bony findings. IMPRESSION: 1. Acute diverticulitis involving the distal transverse colon near its entry point into the left lower quadrant  colostomy. There is associated marked inflammation of the subcutaneous fat, cellulitis and a 3.5 cm diverticular abscess which is intraabdominal. 2. No other significant intra-abdominal findings. Electronically Signed   By: Marijo Sanes M.D.   On: 12/25/2016 07:56       Assessment/Plan Principal Problem:   Abdominal abscess- related to diverticulitis - in immune compromised patient - Gen surgery called by ER- recommended IR consult- I have spoken with Dr Earleen Newport - Cipro and Flagyl - NPO, pain control with Oxycodone Q 4 hrs, Toradol Q 6 hrs and Dilaudid for breakthrough  Active Problems:   Chronic anemia - check anemia panel- noted to have been on Iron in the past    Morbid obesity/   Hepatic steatosis Body mass index is 73.75 kg/m.  - have explained NASH to patient and discussed the need for weight loss    OSA (obstructive sleep apnea) - CPAP     Crohn's disease ([place])  - on Remicaide- last dose last week  Thrombocytosis - due to anemia? follow    DVT prophylaxis: Heparin Code Status: Full code  Family Communication:   Disposition Plan: med surg  Consults called: Gen surgery, IR  Admission status: inpatient    [facility] MD [facility] Pager: [email or web address] Password [email or web address] [phone], please contact night-coverage   12/25/2016, 12:55 PM

## 2016-12-25 NOTE — ED Triage Notes (Signed)
Pt complains of abdominal pain for two weeks, she had abdominal surgery about three years ago and has a colostomy, her pain is around that area

## 2016-12-25 NOTE — ED Notes (Signed)
Unsuccessful lab collection

## 2016-12-26 ENCOUNTER — Inpatient Hospital Stay (HOSPITAL_COMMUNITY): Payer: Medicaid Other

## 2016-12-26 DIAGNOSIS — D509 Iron deficiency anemia, unspecified: Secondary | ICD-10-CM

## 2016-12-26 DIAGNOSIS — K50119 Crohn's disease of large intestine with unspecified complications: Secondary | ICD-10-CM

## 2016-12-26 DIAGNOSIS — G4733 Obstructive sleep apnea (adult) (pediatric): Secondary | ICD-10-CM

## 2016-12-26 DIAGNOSIS — K651 Peritoneal abscess: Secondary | ICD-10-CM

## 2016-12-26 LAB — BASIC METABOLIC PANEL
Anion gap: 7 (ref 5–15)
BUN: 9 mg/dL (ref 6–20)
CO2: 23 mmol/L (ref 22–32)
CREATININE: 0.86 mg/dL (ref 0.44–1.00)
Calcium: 8.3 mg/dL — ABNORMAL LOW (ref 8.9–10.3)
Chloride: 105 mmol/L (ref 101–111)
GFR calc Af Amer: >60 mL/min (ref >60)
GLUCOSE: 85 mg/dL (ref 65–99)
Potassium: 4.4 mmol/L (ref 3.5–5.1)
SODIUM: 135 mmol/L (ref 135–145)

## 2016-12-26 LAB — CBC
HCT: 25.7 % — ABNORMAL LOW (ref 36.0–46.0)
HEMOGLOBIN: 7.4 g/dL — AB (ref 12.0–15.0)
MCH: 18.8 pg — AB (ref 26.0–34.0)
MCHC: 28.8 g/dL — AB (ref 30.0–36.0)
MCV: 65.2 fL — AB (ref 78.0–100.0)
PLATELETS: 417 10*3/uL — AB (ref 150–400)
RBC: 3.94 MIL/uL (ref 3.87–5.11)
RDW: 17.8 % — ABNORMAL HIGH (ref 11.5–15.5)
WBC: 6.9 10*3/uL (ref 4.0–10.5)

## 2016-12-26 LAB — FOLATE: Folate: 16.5 ng/mL (ref >5.9)

## 2016-12-26 LAB — HIV ANTIBODY (ROUTINE TESTING W REFLEX): HIV Screen 4th Generation wRfx: NONREACTIVE

## 2016-12-26 LAB — VITAMIN B12: VITAMIN B 12: 303 pg/mL (ref 180–914)

## 2016-12-26 MED ORDER — FENTANYL CITRATE (PF) 100 MCG/2ML IJ SOLN
INTRAMUSCULAR | Status: AC | PRN
  Administered 2016-12-26: 50 ug via INTRAVENOUS
  Administered 2016-12-26: 25 ug via INTRAVENOUS

## 2016-12-26 MED ORDER — MIDAZOLAM HCL 2 MG/2ML IJ SOLN
INTRAMUSCULAR | Status: AC | PRN
  Administered 2016-12-26 (×2): 1 mg via INTRAVENOUS

## 2016-12-26 MED ORDER — FENTANYL CITRATE (PF) 100 MCG/2ML IJ SOLN
INTRAMUSCULAR | Status: AC
  Filled 2016-12-26: qty 4

## 2016-12-26 MED ORDER — MIDAZOLAM HCL 2 MG/2ML IJ SOLN
INTRAMUSCULAR | Status: AC
  Filled 2016-12-26: qty 4

## 2016-12-26 MED ORDER — SACCHAROMYCES BOULARDII 250 MG PO CAPS
250.0000 mg | ORAL_CAPSULE | Freq: Two times a day (BID) | ORAL | Status: DC
  Administered 2016-12-26 – 2016-12-29 (×7): 250 mg via ORAL
  Filled 2016-12-26 (×8): qty 1

## 2016-12-26 MED ORDER — SODIUM CHLORIDE 0.9 % IV SOLN
510.0000 mg | Freq: Once | INTRAVENOUS | Status: AC
  Administered 2016-12-26: 510 mg via INTRAVENOUS
  Filled 2016-12-26: qty 17

## 2016-12-26 NOTE — Procedures (Signed)
CT guided aspiration of the left anterior abdominal collection.  Collection looked smaller compared to prior CT.  Needle directed into collection and aspirated approximately 1 ml of bloody purulent fluid.  Collection is too small for a drain.  Placed a Yueh catheter and a few ml of bloody fluid was aspirated.  Minimal blood loss and no immediate complication.  Fluid sent for culture.

## 2016-12-26 NOTE — Care Management Note (Signed)
Case Management Note  Patient Details  Name: Dominique [name] MRN: [id number] Date of Birth: 03-14-1981  Subjective/Objective: 36 y/o f admitted w/abd abscesses. From home.IR-percutaneous drain in am.                    Action/Plan:d/c plan home.   Expected Discharge Date:   (unknown)               Expected Discharge Plan:  Home/Self Care  In-House Referral:     Discharge planning Services  CM Consult  Post Acute Care Choice:    Choice offered to:     DME Arranged:    DME Agency:     HH Arranged:    HH Agency:     Status of Service:  In process, will continue to follow  If discussed at Long Length of Stay Meetings, dates discussed:    Additional Comments:  Dessa Phi, RN 12/26/2016, 1:43 PM

## 2016-12-26 NOTE — Progress Notes (Signed)
Subjective: CC:   Acute diverticulitis of transverse colon with associated abscess   She seems very comfortable, even with exam of her abdomen.  No significant pain.  Ostomy is putting out some brownish milk like fluid.    Objective: Vital signs in last 24 hours: Temp:  [97.8 F (36.6 C)-98.6 F (37 C)] 98 F (36.7 C) (04/05 0830) Pulse Rate:  [79-96] 85 (04/05 0830) Resp:  [18-21] 19 (04/05 0830) BP: (101-145)/(60-79) 135/75 (04/05 0830) SpO2:  [99 %-100 %] 100 % (04/05 0830) Weight:  [182.9 kg (403 lb 3.2 oz)] 182.9 kg (403 lb 3.2 oz) (04/04 1030) Last BM Date: 12/25/16 960 PO recorded 2800 IV 1700 urine Afebrile, VSS BMP OK, WBC is normal H/H down with hydration CT scan 12/25/16:  Acute diverticulitis involving the distal transverse colon near its entry point into the left lower quadrant colostomy. There is associated marked inflammation of the subcutaneous fat, cellulitis and a 3.5 cm diverticular abscess which is intraabdominal.  Intake/Output from previous day: 04/04 0701 - 04/05 0700 In: 3512.1 [P.O.:960; I.V.:2252.1; IV Piggyback:300] Out: 1700 [Urine:1700] Intake/Output this shift: No intake/output data recorded.  General appearance: alert, cooperative and no distress Resp: clear to auscultation bilaterally GI: soft, very large abdomen, mid line hernia within the scar.  Ostomy working.  Pain appears to be mid abdomen, above the midline incision.  Lab Results:   Recent Labs  12/25/16 0625 12/26/16 0615  WBC 8.6 6.9  HGB 8.1* 7.4*  HCT 28.0* 25.7*  PLT 423* 417*    BMET  Recent Labs  12/25/16 0625 12/26/16 0447  NA 139 135  K 4.1 4.4  CL 105 105  CO2 27 23  GLUCOSE 96 85  BUN 15 9  CREATININE 1.03* 0.86  CALCIUM 8.6* 8.3*   PT/INR No results for input(s): LABPROT, INR in the last 72 hours.   Recent Labs Lab 12/25/16 0625  AST 11*  ALT 9*  ALKPHOS 59  BILITOT 0.4  PROT 7.3  ALBUMIN 2.8*     Lipase     Component Value Date/Time    LIPASE 15 12/25/2016 0625     Studies/Results: Ct Abdomen Pelvis W Contrast  Result Date: 12/25/2016 CLINICAL DATA:  Pain around colostomy site. History of diverticulitis. Sickle cell disease. EXAM: CT ABDOMEN AND PELVIS WITH CONTRAST TECHNIQUE: Multidetector CT imaging of the abdomen and pelvis was performed using the standard protocol following bolus administration of intravenous contrast. CONTRAST:  117m ISOVUE-300 IOPAMIDOL (ISOVUE-300) INJECTION 61% COMPARISON:  08/08/2015 FINDINGS: Examination is limited due to morbid obesity. Lower chest: The lung bases are clear. Mild stable cardiac enlargement but no pericardial effusion. Hepatobiliary: No focal hepatic lesions or intrahepatic biliary dilatation. Gallbladder is grossly normal. No common bile duct dilatation. Pancreas: Grossly normal.  No findings for acute pancreatitis. Spleen: Within normal limits in size.  No focal lesions. Adrenals/Urinary Tract: The adrenal glands and kidneys are grossly normal. No renal lesions or hydronephrosis. Stomach/Bowel: The stomach, duodenum, small bowel and colon are grossly normal. The the patient does have a left lower quadrant colostomy and there appears to be significant inflammation in the fat around the colostomy site suggesting cellulitis. There is also an abscess located along the medial and superior aspect of the colostomy site. The distal transverse colon appears inflamed with wall thickening and submucosal edema. This is likely diverticulitis as it enters the colostomy site with a adjacent diverticular abscess. Knee sigmoid colon/ Hartmann's pouch appears normal. No acute inflammatory changes. The ascending colon is grossly normal.  No small bowel abnormality is identified. No obstructive findings. Vascular/Lymphatic: The aorta and branch vessels appear normal. The major venous structures are patent. The no mesenteric or retroperitoneal mass or adenopathy. Small scattered lymph nodes are stable.  Reproductive: The uterus and ovaries are unremarkable. Other: No free air. No pelvic fluid collections. Stable anterior abdominal wall hernia containing fat in the transverse colon. No inguinal mass or hernia. No subcutaneous lesions Musculoskeletal: No significant bony findings. IMPRESSION: 1. Acute diverticulitis involving the distal transverse colon near its entry point into the left lower quadrant colostomy. There is associated marked inflammation of the subcutaneous fat, cellulitis and a 3.5 cm diverticular abscess which is intraabdominal. 2. No other significant intra-abdominal findings. Electronically Signed   By: Marijo Sanes M.D.   On: 12/25/2016 07:56   Prior to Admission medications   Medication Sig Start Date End Date Taking? Authorizing Provider  acetaminophen (TYLENOL) 500 MG tablet Take 1,000 mg by mouth every 6 (six) hours as needed for mild pain.   Yes Historical Provider, MD  ibuprofen (ADVIL,MOTRIN) 200 MG tablet Take 400 mg by mouth every 6 (six) hours as needed for moderate pain.   Yes Historical Provider, MD  inFLIXimab (REMICADE) 100 MG injection Inject 100 mg into the vein as directed.   Yes Historical Provider, MD  PROAIR HFA 108 (90 BASE) MCG/ACT inhaler INHALE 2 PUFFS BY MOUTH INTO THE LUNGS EVERY 6 HOURS AS NEEDED FOR WHEEZING OR SHORTNESS OF BREATH 02/17/15  Yes Midge Minium, MD  methocarbamol (ROBAXIN) 500 MG tablet Take 1 tablet (500 mg total) by mouth every 6 (six) hours as needed for muscle spasms. Patient not taking: Reported on 12/25/2016 07/25/15   Reyne Dumas, MD    Medications: . ciprofloxacin  400 mg Intravenous Q12H  . ferumoxytol  510 mg Intravenous Once  . heparin subcutaneous  5,000 Units Subcutaneous Q8H  . metronidazole  500 mg Intravenous Q8H  . oxyCODONE  5 mg Oral Q4H   . 0.9 % NaCl with KCl 20 mEq / L 125 mL/hr at 12/26/16 5848   Assessment/Plan Acute diverticulitis of transverse colon with associated abscess  Hx ofCrohn's Disease s/p  colectomy/colostomy placement 2016  Obesity  OSA  Sickle cell anemia  HTN  Asthma Anxiety Tobacco use  3 FEN:  IV fluids/clear liquids ID:  Cefepime x 1 dose 4/4;  Cipro/Flagyl 12/25/16 =>> day 2 DVT:  SQ Heparin  Plan: Continue antibiotics, INR is going to attempt to place a drain this a.m. Add probiotics. Labs already ordered for the a.m. tomorrow.    LOS: 1 day    Jazmeen Axtell 12/26/2016 [phone]

## 2016-12-26 NOTE — Sedation Documentation (Signed)
Patient denies pain and is resting comfortably.  

## 2016-12-26 NOTE — Progress Notes (Signed)
[facility] PROGRESS NOTE  Dominique [name] QTM:[id number] DOB: 12-Jan-1981 DOA: 12/25/2016  PCP: Benito Mccreedy, MD  Brief History/Interval Summary: 36 y.o. female with medical history significant of Crohn's disease s/p hemicolectomy and colostomy in 2016 due to perforated sigmoid colon, on Remicaide which she last received last week on Wednesday. This is managed by gastroenterology at [facility]. She began having left sided abdominal pain about 2 wks ago. She took some Prednisone she had left over at home, maybe 1 wks worth and the pain resolved. She spoke with her GI doctor who made her and appt for next Tues and asked her to go to the ER if the pain did not improve. She presented to the emergency department with complains of abdominal pain. CT showing diverticulitis in distal transverse colon near the colostomy and a 3.5 cm abscess.   Reason for Visit: Intra-abdominal abscess with diverticulitis  Consultants: Gen. surgery  Procedures: None yet  Antibiotics: Cipro and Flagyl  Subjective/Interval History: Patient feels better this morning. States that her pain is better today compared to yesterday. Denies any nausea or vomiting. No diarrhea.  ROS: Denies any shortness of breath.  Objective:  Vital Signs  Vitals:   12/25/16 1400 12/25/16 2109 12/26/16 0547 12/26/16 0830  BP: 138/79 136/66 106/60 135/75  Pulse: 88 96 87 85  Resp: 18 20 (!) 21 19  Temp: 98.6 F (37 C) 98 F (36.7 C) 97.8 F (36.6 C) 98 F (36.7 C)  TempSrc: Oral Oral Oral Oral  SpO2: 99% 100% 99% 100%  Weight:      Height:        Intake/Output Summary (Last 24 hours) at 12/26/16 1134 Last data filed at 12/26/16 6256  Gross per 24 hour  Intake          3512.08 ml  Output             1700 ml  Net          1812.08 ml   Filed Weights   12/25/16 1030  Weight: (!) 182.9 kg (403 lb 3.2 oz)    General appearance: alert, cooperative, appears stated age and morbidly obese Resp: clear to auscultation  bilaterally Cardio: regular rate and rhythm, S1, S2 normal, no murmur, click, rub or gallop GI: Abdomen is obese. Difficult exam due to size. No obvious areas of tenderness. Colostomy is noted. Extremities: extremities normal, atraumatic, no cyanosis or edema Neurologic: No focal neurological deficits.  Lab Results:  Data Reviewed: I have personally reviewed following labs and imaging studies  CBC:  Recent Labs Lab 12/25/16 0625 12/26/16 0615  WBC 8.6 6.9  NEUTROABS 5.4  --   HGB 8.1* 7.4*  HCT 28.0* 25.7*  MCV 67.1* 65.2*  PLT 423* 417*    Basic Metabolic Panel:  Recent Labs Lab 12/25/16 0625 12/26/16 0447  NA 139 135  K 4.1 4.4  CL 105 105  CO2 27 23  GLUCOSE 96 85  BUN 15 9  CREATININE 1.03* 0.86  CALCIUM 8.6* 8.3*    GFR: Estimated Creatinine Clearance: 147.3 mL/min (by C-G formula based on SCr of 0.86 mg/dL).  Liver Function Tests:  Recent Labs Lab 12/25/16 0625  AST 11*  ALT 9*  ALKPHOS 59  BILITOT 0.4  PROT 7.3  ALBUMIN 2.8*     Recent Labs Lab 12/25/16 0625  LIPASE 15    Anemia Panel:  Recent Labs  12/25/16 1420 12/26/16 0447  VITAMINB12  --  303  FOLATE  --  16.5  FERRITIN 7*  --   TIBC 302  --   IRON 13*  --   RETICCTPCT 1.1  --      Radiology Studies: Ct Abdomen Pelvis W Contrast  Result Date: 12/25/2016 CLINICAL DATA:  Pain around colostomy site. History of diverticulitis. Sickle cell disease. EXAM: CT ABDOMEN AND PELVIS WITH CONTRAST TECHNIQUE: Multidetector CT imaging of the abdomen and pelvis was performed using the standard protocol following bolus administration of intravenous contrast. CONTRAST:  140m ISOVUE-300 IOPAMIDOL (ISOVUE-300) INJECTION 61% COMPARISON:  08/08/2015 FINDINGS: Examination is limited due to morbid obesity. Lower chest: The lung bases are clear. Mild stable cardiac enlargement but no pericardial effusion. Hepatobiliary: No focal hepatic lesions or intrahepatic biliary dilatation. Gallbladder is  grossly normal. No common bile duct dilatation. Pancreas: Grossly normal.  No findings for acute pancreatitis. Spleen: Within normal limits in size.  No focal lesions. Adrenals/Urinary Tract: The adrenal glands and kidneys are grossly normal. No renal lesions or hydronephrosis. Stomach/Bowel: The stomach, duodenum, small bowel and colon are grossly normal. The the patient does have a left lower quadrant colostomy and there appears to be significant inflammation in the fat around the colostomy site suggesting cellulitis. There is also an abscess located along the medial and superior aspect of the colostomy site. The distal transverse colon appears inflamed with wall thickening and submucosal edema. This is likely diverticulitis as it enters the colostomy site with a adjacent diverticular abscess. Knee sigmoid colon/ Hartmann's pouch appears normal. No acute inflammatory changes. The ascending colon is grossly normal. No small bowel abnormality is identified. No obstructive findings. Vascular/Lymphatic: The aorta and branch vessels appear normal. The major venous structures are patent. The no mesenteric or retroperitoneal mass or adenopathy. Small scattered lymph nodes are stable. Reproductive: The uterus and ovaries are unremarkable. Other: No free air. No pelvic fluid collections. Stable anterior abdominal wall hernia containing fat in the transverse colon. No inguinal mass or hernia. No subcutaneous lesions Musculoskeletal: No significant bony findings. IMPRESSION: 1. Acute diverticulitis involving the distal transverse colon near its entry point into the left lower quadrant colostomy. There is associated marked inflammation of the subcutaneous fat, cellulitis and a 3.5 cm diverticular abscess which is intraabdominal. 2. No other significant intra-abdominal findings. Electronically Signed   By: PMarijo SanesM.D.   On: 12/25/2016 07:56     Medications:  Scheduled: . ciprofloxacin  400 mg Intravenous Q12H  .  heparin subcutaneous  5,000 Units Subcutaneous Q8H  . metronidazole  500 mg Intravenous Q8H  . oxyCODONE  5 mg Oral Q4H  . saccharomyces boulardii  250 mg Oral BID   Continuous: . 0.9 % NaCl with KCl 20 mEq / L 75 mL/hr at 12/26/16 04403  PKVQ:QVZDGLOVFIEPP**OR** acetaminophen, HYDROmorphone (DILAUDID) injection, ipratropium-albuterol, ondansetron **OR** ondansetron (ZOFRAN) IV  Assessment/Plan:  Principal Problem:   Abdominal abscess (HCC) Active Problems:   Morbid obesity- BMI 67.5   OSA (obstructive sleep apnea)   Diverticulitis of colon   Hepatic steatosis   [place]   Obesity hypoventilation syndrome (HCC)   Pulmonary hypertension (HCC)   Chronic anemia    Abdominal abscess- related to diverticulitis Patient is immunocompromised due to use of Remicade. General surgery was consulted. Patient also seen by interventional radiology and plan is for drainage under CT guidance. Continue Cipro and Flagyl. Symptomatically, patient has improved. Continue current management for now. HIV nonreactive.  Iron deficiency anemia Ferritin is 7. TIBC 302. B-12 level 303. Folic acid 129.5 She will be given iron  infusion. She will need to be started on iron supplementations when ready for discharge. Etiology for her iron deficiency is not entirely clear.   Morbid obesity/Hepatic steatosis Body mass index is 73.75 kg/m.  NASH was explained to patient and discussed the need for weight loss.  OSA (obstructive sleep apnea) CPAP   Crohn's disease On Remicaide- last dose last week. Followed at [facility].  Mild Thrombocytosis Most probably due to iron deficiency anemia. Continue to monitor.   DVT Prophylaxis: Subcutaneous heparin    Code Status: Full code  Family Communication: Discussed with patient  Disposition Plan: Management as outlined above.    LOS: 1 day   [facility] Pager [phone] 12/26/2016, 11:34 AM  If 7PM-7AM, please  contact night-coverage at [email or web address], password [facility]

## 2016-12-27 LAB — CBC
HCT: 25.2 % — ABNORMAL LOW (ref 36.0–46.0)
Hemoglobin: 7.2 g/dL — ABNORMAL LOW (ref 12.0–15.0)
MCH: 18.7 pg — ABNORMAL LOW (ref 26.0–34.0)
MCHC: 28.6 g/dL — ABNORMAL LOW (ref 30.0–36.0)
MCV: 65.3 fL — AB (ref 78.0–100.0)
PLATELETS: 399 10*3/uL (ref 150–400)
RBC: 3.86 MIL/uL — AB (ref 3.87–5.11)
RDW: 17.7 % — ABNORMAL HIGH (ref 11.5–15.5)
WBC: 5.3 10*3/uL (ref 4.0–10.5)

## 2016-12-27 LAB — BASIC METABOLIC PANEL
ANION GAP: 6 (ref 5–15)
BUN: 7 mg/dL (ref 6–20)
CO2: 24 mmol/L (ref 22–32)
Calcium: 8.7 mg/dL — ABNORMAL LOW (ref 8.9–10.3)
Chloride: 107 mmol/L (ref 101–111)
Creatinine, Ser: 0.81 mg/dL (ref 0.44–1.00)
GFR calc Af Amer: >60 mL/min (ref >60)
GLUCOSE: 85 mg/dL (ref 65–99)
POTASSIUM: 4.3 mmol/L (ref 3.5–5.1)
Sodium: 137 mmol/L (ref 135–145)

## 2016-12-27 MED ORDER — FERROUS SULFATE 325 (65 FE) MG PO TABS: 325.0000 mg | ORAL_TABLET | Freq: Two times a day (BID) | ORAL | 3 refills | Status: DC

## 2016-12-27 NOTE — Progress Notes (Signed)
[facility] PROGRESS NOTE  Dominique [name] QQV:[id number] DOB: 17-Jan-1981 DOA: 12/25/2016  PCP: Benito Mccreedy, MD  Brief History/Interval Summary: 36 y.o. female with medical history significant of Crohn's disease s/p hemicolectomy and colostomy in 2016 due to perforated sigmoid colon, on Remicaide which she last received last week on Wednesday. This is managed by gastroenterology at [facility]. She began having left sided abdominal pain about 2 wks ago. She took some Prednisone she had left over at home, maybe 1 wks worth and the pain resolved. She spoke with her GI doctor who made her and appt for next Tues and asked her to go to the ER if the pain did not improve. She presented to the emergency department with complains of abdominal pain. CT showing diverticulitis in distal transverse colon near the colostomy and a 3.5 cm abscess.   Reason for Visit: Intra-abdominal abscess with diverticulitis  Consultants: Gen. surgery  Procedures: CT guided aspiration of the left anterior abdominal collection  Antibiotics: Cipro and Flagyl  Subjective/Interval History: Patient feels much better this morning. Pain is improving. Denies any nausea or vomiting. No diarrhea.   ROS: Denies any shortness of breath.  Objective:  Vital Signs  Vitals:   12/26/16 1712 12/26/16 1715 12/26/16 2136 12/27/16 0530  BP: (!) 140/97 (!) 150/81 134/88 (!) 118/48  Pulse: 98 79 89 100  Resp: (!) 23 16 20 18   Temp:   97.8 F (36.6 C) 98.4 F (36.9 C)  TempSrc:   Oral Oral  SpO2: 100% 100% 99% 97%  Weight:      Height:        Intake/Output Summary (Last 24 hours) at 12/27/16 3329 Last data filed at 12/27/16 5188  Gross per 24 hour  Intake             3195 ml  Output             3400 ml  Net             -205 ml   Filed Weights   12/25/16 1030  Weight: (!) 182.9 kg (403 lb 3.2 oz)    General appearance: alert, cooperative, appears stated age and morbidly obese Resp: clear to auscultation  bilaterally Cardio: regular rate and rhythm, S1, S2 normal, no murmur, click, rub or gallop GI: Abdomen is obese. Difficult exam due to size. Mildly tender around the ostomy. No erythema or drainage noted. No rebound, rigidity or guarding. Colostomy is noted. Extremities: extremities normal, atraumatic, no cyanosis or edema Neurologic: No focal neurological deficits.  Lab Results:  Data Reviewed: I have personally reviewed following labs and imaging studies  CBC:  Recent Labs Lab 12/25/16 0625 12/26/16 0615 12/27/16 0524  WBC 8.6 6.9 5.3  NEUTROABS 5.4  --   --   HGB 8.1* 7.4* 7.2*  HCT 28.0* 25.7* 25.2*  MCV 67.1* 65.2* 65.3*  PLT 423* 417* 416    Basic Metabolic Panel:  Recent Labs Lab 12/25/16 0625 12/26/16 0447 12/27/16 0524  NA 139 135 137  K 4.1 4.4 4.3  CL 105 105 107  CO2 27 23 24   GLUCOSE 96 85 85  BUN 15 9 7   CREATININE 1.03* 0.86 0.81  CALCIUM 8.6* 8.3* 8.7*    GFR: Estimated Creatinine Clearance: 156.4 mL/min (by C-G formula based on SCr of 0.81 mg/dL).  Liver Function Tests:  Recent Labs Lab 12/25/16 0625  AST 11*  ALT 9*  ALKPHOS 59  BILITOT 0.4  PROT 7.3  ALBUMIN 2.8*  Recent Labs Lab 12/25/16 0625  LIPASE 15    Anemia Panel:  Recent Labs  12/25/16 1420 12/26/16 0447  VITAMINB12  --  303  FOLATE  --  16.5  FERRITIN 7*  --   TIBC 302  --   IRON 13*  --   RETICCTPCT 1.1  --      Radiology Studies: Ct Image Guided Fluid Drain By Catheter  Result Date: 12/27/2016 INDICATION: 36 year old with presumed diverticulitis and concern for abscess collection along the left anterior abdominal wall. Patient was scheduled for CT-guided aspiration and possible drainage. EXAM: CT-GUIDED ASPIRATION OF THE LEFT ABDOMINAL FLUID COLLECTION MEDICATIONS: None ANESTHESIA/SEDATION: Fentanyl 75 mcg IV; Versed 2.0 mg IV Moderate Sedation Time:  20 minutes The patient was continuously monitored during the procedure by the interventional radiology  nurse under my direct supervision. COMPLICATIONS: None immediate. PROCEDURE: Informed written consent was obtained from the patient after a thorough discussion of the procedural risks, benefits and alternatives. All questions were addressed. A timeout was performed prior to the initiation of the procedure. Patient has placed supine on the CT scanner. Images through the abdomen were obtained. The collection along the anterior left abdomen was targeted. The abdomen just medial to the ostomy was prepped with chlorhexidine and sterile field was created. Skin was anesthetized with 1% lidocaine. 18 gauge trocar needle was directed into the collection with CT guidance and approximately 1 ml of bloody purulent fluid was obtained. Attempted to advance an Amplatz wire into the collection but the wire would not advance very far. As a result, a 5 [place] Yueh catheter was advanced over the wire and attempted to aspirate more fluid. No significant fluid was aspirated in the collection. However, some blood was aspirated as the catheter was withdrawn. Manual compression was placed over the puncture site. Bandage placed over the puncture site. FINDINGS: Morbidly obese patient with a left colostomy. Again noted is an irregular collection along the left anterior abdominal wall just medial to the ostomy. This collection contains a small amount of gas. However, the collection appears to be slightly smaller than the CT from 12/25/2016. Only a small amount of bloody fluid could be aspirated from this area. This collection was not adequate for drain placement. IMPRESSION: CT-guided aspiration of the complex collection along the left anterior abdominal wall. Fluid was sent for culture. Electronically Signed   By: Markus Daft M.D.   On: 12/27/2016 08:14     Medications:  Scheduled: . ciprofloxacin  400 mg Intravenous Q12H  . heparin subcutaneous  5,000 Units Subcutaneous Q8H  . metronidazole  500 mg Intravenous Q8H  . oxyCODONE  5 mg  Oral Q4H  . saccharomyces boulardii  250 mg Oral BID   Continuous: . 0.9 % NaCl with KCl 20 mEq / L 75 mL/hr at 12/27/16 5597   CBU:LAGTXMIWOEHOZ **OR** acetaminophen, HYDROmorphone (DILAUDID) injection, ipratropium-albuterol, ondansetron **OR** ondansetron (ZOFRAN) IV  Assessment/Plan:  Principal Problem:   Abdominal abscess (HCC) Active Problems:   Morbid obesity- BMI 67.5   OSA (obstructive sleep apnea)   Diverticulitis of colon   Hepatic steatosis   Crohn's disease ([place]   Obesity hypoventilation syndrome (HCC)   Pulmonary hypertension (HCC)   Chronic anemia    Abdominal abscess- related to diverticulitis Patient is immunocompromised due to use of Remicade. General surgery was consulted. Patient also seen by interventional radiology and patient underwent CT guided aspiration. The fluid collection was noted to be smaller than previously seen. No drainage catheter was placed. Fluid was sent  to lab. Continue Cipro and Flagyl. Symptomatically, patient continues to improve. HIV nonreactive. Advance diet.  Iron deficiency anemia Ferritin is 7. TIBC 302. B-12 level 303. Folic acid 28.9. She was given iron infusion. She will need to be started on iron supplementations when ready for discharge. Etiology for her iron deficiency is not entirely clear. Patient reports a long-standing history of same. Denies any symptoms associated with anemia. She is reluctant to undergo blood transfusions.  Morbid obesity/Hepatic steatosis Body mass index is 73.75 kg/m.    OSA (obstructive sleep apnea) CPAP   Crohn's disease On Remicaide- last dose last week. Followed at [facility].  Mild Thrombocytosis Most probably due to iron deficiency anemia. Continue to monitor.   DVT Prophylaxis: Subcutaneous heparin    Code Status: Full code  Family Communication: Discussed with patient  Disposition Plan: Management as outlined above. Mobilize as tolerated.    LOS: 2 days    [facility] Pager [phone] 12/27/2016, 9:37 AM  If 7PM-7AM, please contact night-coverage at [email or web address], password [facility]

## 2016-12-27 NOTE — Progress Notes (Signed)
[facility] Progress Note     Subjective: Cc one episode nausea/vomiting this AM. Abdominal pain has decreased and is rated a 4/10. Patient reports changing her colostomy pouch at 0300 - states it had a lot of gas and a small amount of thin liquid. Urinating without hesitancy.  Afebrile, VSS Objective: Vital signs in last 24 hours: Temp:  [97.8 F (36.6 C)-98.4 F (36.9 C)] 98.4 F (36.9 C) (04/06 0530) Pulse Rate:  [76-100] 100 (04/06 0530) Resp:  [13-23] 18 (04/06 0530) BP: (91-150)/(48-97) 118/48 (04/06 0530) SpO2:  [97 %-100 %] 97 % (04/06 0530) Last BM Date: 12/26/16  Intake/Output from previous day: 04/05 0701 - 04/06 0700 In: 3195 [P.O.:240; I.V.:1956; IV Piggyback:1000] Out: 4200 [Urine:4000; Emesis/NG output:200] Intake/Output this shift: No intake/output data recorded.  PE: Gen:  Alert, NAD, pleasant Card:  Regular rate and rhythm, pedal pulses 2 + bilaterally  Pulm:  Normal respiratory effort  Abd: Soft, obese, non-tender, non-distended, bowel sounds present in all 4 quadrants, colostomy pouch w/ good seal. Stoma viable.  Ext:  No erythema, edema, or tenderness Lab Results:   Recent Labs  12/26/16 0615 12/27/16 0524  WBC 6.9 5.3  HGB 7.4* 7.2*  HCT 25.7* 25.2*  PLT 417* 399   BMET  Recent Labs  12/26/16 0447 12/27/16 0524  NA 135 137  K 4.4 4.3  CL 105 107  CO2 23 24  GLUCOSE 85 85  BUN 9 7  CREATININE 0.86 0.81  CALCIUM 8.3* 8.7*   PT/INR No results for input(s): LABPROT, INR in the last 72 hours. CMP     Component Value Date/Time   NA 137 12/27/2016 0524   K 4.3 12/27/2016 0524   CL 107 12/27/2016 0524   CO2 24 12/27/2016 0524   GLUCOSE 85 12/27/2016 0524   BUN 7 12/27/2016 0524   CREATININE 0.81 12/27/2016 0524   CREATININE 0.52 12/29/2014 1557   CALCIUM 8.7 (L) 12/27/2016 0524   PROT 7.3 12/25/2016 0625   ALBUMIN 2.8 (L) 12/25/2016 0625   AST 11 (L) 12/25/2016 0625   ALT 9 (L) 12/25/2016 0625   ALKPHOS 59  12/25/2016 0625   BILITOT 0.4 12/25/2016 0625   GFRNONAA >60 12/27/2016 0524   GFRAA >60 12/27/2016 0524   Lipase     Component Value Date/Time   LIPASE 15 12/25/2016 0625       Studies/Results: Ct Image Guided Fluid Drain By Catheter  Result Date: 12/27/2016 INDICATION: 36 year old with presumed diverticulitis and concern for abscess collection along the left anterior abdominal wall. Patient was scheduled for CT-guided aspiration and possible drainage. EXAM: CT-GUIDED ASPIRATION OF THE LEFT ABDOMINAL FLUID COLLECTION MEDICATIONS: None ANESTHESIA/SEDATION: Fentanyl 75 mcg IV; Versed 2.0 mg IV Moderate Sedation Time:  20 minutes The patient was continuously monitored during the procedure by the interventional radiology nurse under my direct supervision. COMPLICATIONS: None immediate. PROCEDURE: Informed written consent was obtained from the patient after a thorough discussion of the procedural risks, benefits and alternatives. All questions were addressed. A timeout was performed prior to the initiation of the procedure. Patient has placed supine on the CT scanner. Images through the abdomen were obtained. The collection along the anterior left abdomen was targeted. The abdomen just medial to the ostomy was prepped with chlorhexidine and sterile field was created. Skin was anesthetized with 1% lidocaine. 18 gauge trocar needle was directed into the collection with CT guidance and approximately 1 ml of bloody purulent fluid was obtained. Attempted to advance an Amplatz wire into  the collection but the wire would not advance very far. As a result, a 5 [place] Yueh catheter was advanced over the wire and attempted to aspirate more fluid. No significant fluid was aspirated in the collection. However, some blood was aspirated as the catheter was withdrawn. Manual compression was placed over the puncture site. Bandage placed over the puncture site. FINDINGS: Morbidly obese patient with a left colostomy.  Again noted is an irregular collection along the left anterior abdominal wall just medial to the ostomy. This collection contains a small amount of gas. However, the collection appears to be slightly smaller than the CT from 12/25/2016. Only a small amount of bloody fluid could be aspirated from this area. This collection was not adequate for drain placement. IMPRESSION: CT-guided aspiration of the complex collection along the left anterior abdominal wall. Fluid was sent for culture. Electronically Signed   By: Markus Daft M.D.   On: 12/27/2016 08:14    Anti-infectives: Anti-infectives    Start     Dose/Rate Route Frequency Ordered Stop   12/25/16 1600  metroNIDAZOLE (FLAGYL) IVPB 500 mg     500 mg 100 mL/hr over 60 Minutes Intravenous Every 8 hours 12/25/16 1300     12/25/16 1400  ciprofloxacin (CIPRO) IVPB 400 mg     400 mg 200 mL/hr over 60 Minutes Intravenous Every 12 hours 12/25/16 1300     12/25/16 0815  ceFEPIme (MAXIPIME) 2 g in dextrose 5 % 50 mL IVPB     2 g 100 mL/hr over 30 Minutes Intravenous  Once 12/25/16 0813 12/25/16 0931   12/25/16 0815  metroNIDAZOLE (FLAGYL) IVPB 500 mg     500 mg 100 mL/hr over 60 Minutes Intravenous  Once 12/25/16 0813 12/25/16 1038     Assessment/Plan Acute diverticulitis of transverse colon with associated abscess  S/P IR aspiration of fluid collection; cultures pending; currently no organisms seen - afebrile, VSS, no leukocytosis  Hx of Crohn's Disease s/p colectomy/colostomy placement 2016  Obesity  OSA  Sickle cell anemia - hgb 7.2  HTN  Asthma Anxiety Tobacco use  3  FEN:  SOFT diet, electrolytes WNL ID:  Cefepime x 1 dose 4/4;  Cipro/Flagyl 4/4 >> DVT:  SQ Heparin  Plan: advance diet, follow cultures, continue abx for now  Anticipate pt will be stable for discharge in 24-48 h    LOS: 2 days    Jill Alexanders , [facility] 12/27/2016, 9:40 AM Pager: [phone] Consults: [phone] Mon-Fri 7:00  am-4:30 pm Sat-Sun 7:00 am-11:30 am

## 2016-12-28 DIAGNOSIS — D649 Anemia, unspecified: Secondary | ICD-10-CM

## 2016-12-28 LAB — BASIC METABOLIC PANEL
ANION GAP: 6 (ref 5–15)
BUN: 7 mg/dL (ref 6–20)
CALCIUM: 8.8 mg/dL — AB (ref 8.9–10.3)
CO2: 25 mmol/L (ref 22–32)
Chloride: 104 mmol/L (ref 101–111)
Creatinine, Ser: 0.87 mg/dL (ref 0.44–1.00)
Glucose, Bld: 107 mg/dL — ABNORMAL HIGH (ref 65–99)
Potassium: 4.1 mmol/L (ref 3.5–5.1)
Sodium: 135 mmol/L (ref 135–145)

## 2016-12-28 LAB — CBC
HEMATOCRIT: 27.3 % — AB (ref 36.0–46.0)
Hemoglobin: 7.8 g/dL — ABNORMAL LOW (ref 12.0–15.0)
MCH: 18.7 pg — ABNORMAL LOW (ref 26.0–34.0)
MCHC: 28.6 g/dL — ABNORMAL LOW (ref 30.0–36.0)
MCV: 65.5 fL — ABNORMAL LOW (ref 78.0–100.0)
Platelets: 420 10*3/uL — ABNORMAL HIGH (ref 150–400)
RBC: 4.17 MIL/uL (ref 3.87–5.11)
RDW: 17.7 % — AB (ref 11.5–15.5)
WBC: 5.7 10*3/uL (ref 4.0–10.5)

## 2016-12-28 MED ORDER — CIPROFLOXACIN HCL 500 MG PO TABS
500.0000 mg | ORAL_TABLET | Freq: Two times a day (BID) | ORAL | Status: DC
  Administered 2016-12-28 – 2016-12-29 (×2): 500 mg via ORAL
  Filled 2016-12-28 (×2): qty 1

## 2016-12-28 MED ORDER — METRONIDAZOLE 500 MG PO TABS
500.0000 mg | ORAL_TABLET | Freq: Three times a day (TID) | ORAL | Status: DC
  Administered 2016-12-28 – 2016-12-29 (×4): 500 mg via ORAL
  Filled 2016-12-28 (×4): qty 1

## 2016-12-28 NOTE — Progress Notes (Signed)
[facility] PROGRESS NOTE  Dominique [name] XHB:[id number] DOB: 05/24/1981 DOA: 12/25/2016  PCP: Benito Mccreedy, MD  Brief History/Interval Summary: 36 y.o. female with medical history significant of Crohn's disease s/p hemicolectomy and colostomy in 2016 due to perforated sigmoid colon, on Remicaide which she last received last week on Wednesday. This is managed by gastroenterology at [facility]. She began having left sided abdominal pain about 2 wks ago. She took some Prednisone she had left over at home, maybe 1 wks worth and the pain resolved. She spoke with her GI doctor who made her and appt for next Tues and asked her to go to the ER if the pain did not improve. She presented to the emergency department with complains of abdominal pain. CT showing diverticulitis in distal transverse colon near the colostomy and a 3.5 cm abscess.   Reason for Visit: Intra-abdominal abscess with diverticulitis  Consultants: Gen. surgery  Procedures: CT guided aspiration of the left anterior abdominal collection  Antibiotics: Cipro and Flagyl  Subjective/Interval History: Patient continues to feel better. Pain in her abdomen has improved. Denies any nausea, vomiting. Tolerating her diet.    ROS: Denies any shortness of breath.  Objective:  Vital Signs  Vitals:   12/27/16 0530 12/27/16 1352 12/27/16 2124 12/28/16 0601  BP: (!) 118/48 (!) 151/81 115/60 120/65  Pulse: 100 99 86 85  Resp: 18 18 20 19   Temp: 98.4 F (36.9 C) 97.9 F (36.6 C) 97.9 F (36.6 C) 98 F (36.7 C)  TempSrc: Oral Oral Oral Oral  SpO2: 97% 99% 99% 100%  Weight:      Height:        Intake/Output Summary (Last 24 hours) at 12/28/16 0830 Last data filed at 12/28/16 0600  Gross per 24 hour  Intake             2850 ml  Output             3750 ml  Net             -900 ml   Filed Weights   12/25/16 1030  Weight: (!) 182.9 kg (403 lb 3.2 oz)    General appearance: alert, cooperative, appears stated age and  morbidly obese Resp: clear to auscultation bilaterally Cardio: regular rate and rhythm, S1, S2 normal, no murmur, click, rub or gallop GI: Abdomen is obese. Difficult exam due to size. Mildly tender around the ostomy. No erythema or drainage noted. No rebound, rigidity or guarding. Colostomy is noted. Extremities: extremities normal, atraumatic, no cyanosis or edema Neurologic: No focal neurological deficits.  Lab Results:  Data Reviewed: I have personally reviewed following labs and imaging studies  CBC:  Recent Labs Lab 12/25/16 0625 12/26/16 0615 12/27/16 0524 12/28/16 0459  WBC 8.6 6.9 5.3 5.7  NEUTROABS 5.4  --   --   --   HGB 8.1* 7.4* 7.2* 7.8*  HCT 28.0* 25.7* 25.2* 27.3*  MCV 67.1* 65.2* 65.3* 65.5*  PLT 423* 417* 399 420*    Basic Metabolic Panel:  Recent Labs Lab 12/25/16 0625 12/26/16 0447 12/27/16 0524 12/28/16 0459  NA 139 135 137 135  K 4.1 4.4 4.3 4.1  CL 105 105 107 104  CO2 27 23 24 25   GLUCOSE 96 85 85 107*  BUN 15 9 7 7   CREATININE 1.03* 0.86 0.81 0.87  CALCIUM 8.6* 8.3* 8.7* 8.8*    GFR: Estimated Creatinine Clearance: 145.6 mL/min (by C-G formula based on SCr of 0.87 mg/dL).  Liver Function Tests:  Recent Labs Lab 12/25/16 0625  AST 11*  ALT 9*  ALKPHOS 59  BILITOT 0.4  PROT 7.3  ALBUMIN 2.8*     Recent Labs Lab 12/25/16 0625  LIPASE 15    Anemia Panel:  Recent Labs  12/25/16 1420 12/26/16 0447  VITAMINB12  --  303  FOLATE  --  16.5  FERRITIN 7*  --   TIBC 302  --   IRON 13*  --   RETICCTPCT 1.1  --      Radiology Studies: Ct Image Guided Fluid Drain By Catheter  Result Date: 12/27/2016 INDICATION: 37 year old with presumed diverticulitis and concern for abscess collection along the left anterior abdominal wall. Patient was scheduled for CT-guided aspiration and possible drainage. EXAM: CT-GUIDED ASPIRATION OF THE LEFT ABDOMINAL FLUID COLLECTION MEDICATIONS: None ANESTHESIA/SEDATION: Fentanyl 75 mcg IV; Versed  2.0 mg IV Moderate Sedation Time:  20 minutes The patient was continuously monitored during the procedure by the interventional radiology nurse under my direct supervision. COMPLICATIONS: None immediate. PROCEDURE: Informed written consent was obtained from the patient after a thorough discussion of the procedural risks, benefits and alternatives. All questions were addressed. A timeout was performed prior to the initiation of the procedure. Patient has placed supine on the CT scanner. Images through the abdomen were obtained. The collection along the anterior left abdomen was targeted. The abdomen just medial to the ostomy was prepped with chlorhexidine and sterile field was created. Skin was anesthetized with 1% lidocaine. 18 gauge trocar needle was directed into the collection with CT guidance and approximately 1 ml of bloody purulent fluid was obtained. Attempted to advance an Amplatz wire into the collection but the wire would not advance very far. As a result, a 5 [place] Yueh catheter was advanced over the wire and attempted to aspirate more fluid. No significant fluid was aspirated in the collection. However, some blood was aspirated as the catheter was withdrawn. Manual compression was placed over the puncture site. Bandage placed over the puncture site. FINDINGS: Morbidly obese patient with a left colostomy. Again noted is an irregular collection along the left anterior abdominal wall just medial to the ostomy. This collection contains a small amount of gas. However, the collection appears to be slightly smaller than the CT from 12/25/2016. Only a small amount of bloody fluid could be aspirated from this area. This collection was not adequate for drain placement. IMPRESSION: CT-guided aspiration of the complex collection along the left anterior abdominal wall. Fluid was sent for culture. Electronically Signed   By: Markus Daft M.D.   On: 12/27/2016 08:14     Medications:  Scheduled: . ciprofloxacin   400 mg Intravenous Q12H  . heparin subcutaneous  5,000 Units Subcutaneous Q8H  . metronidazole  500 mg Intravenous Q8H  . oxyCODONE  5 mg Oral Q4H  . saccharomyces boulardii  250 mg Oral BID   Continuous: . 0.9 % NaCl with KCl 20 mEq / L 1,000 mL (12/28/16 0557)   YIA:[id number] **OR** acetaminophen, HYDROmorphone (DILAUDID) injection, ipratropium-albuterol, ondansetron **OR** ondansetron (ZOFRAN) IV  Assessment/Plan:  Principal Problem:   Abdominal abscess (HCC) Active Problems:   Morbid obesity- BMI 67.5   OSA (obstructive sleep apnea)   Diverticulitis of colon   Hepatic steatosis   Crohn's disease ([place])   Obesity hypoventilation syndrome (HCC)   Pulmonary hypertension (HCC)   Chronic anemia    Abdominal abscess- related to diverticulitis Patient is immunocompromised due to use of Remicade. General surgery was consulted. Patient also seen by interventional  radiology and patient underwent CT guided aspiration. The fluid collection was noted to be smaller than previously seen. No drainage catheter was placed. Fluid culture without growth for the last 2 days. Continue Cipro and Flagyl, but will change to oral. Symptomatically, patient continues to improve. HIV nonreactive. Continue soft diet.  Iron deficiency anemia Ferritin is 7. TIBC 302. B-12 level 303. Folic acid 29.2. She was given iron infusion. She will need to be started on iron supplementations when ready for discharge. Etiology for her iron deficiency is not entirely clear. Patient reports a long-standing history of same. Denies any symptoms associated with anemia. She is reluctant to undergo blood transfusions. Hgb is stable.  Morbid obesity/Hepatic steatosis Body mass index is 73.75 kg/m.    OSA (obstructive sleep apnea) CPAP   Crohn's disease On Remicaide- last dose last week. Followed at [facility]. She has an appointment with her gastroenterologist next week.  Mild Thrombocytosis Most probably due  to iron deficiency anemia. Stable.   DVT Prophylaxis: Subcutaneous heparin    Code Status: Full code  Family Communication: Discussed with patient  Disposition Plan: Management as outlined above. Mobilize as tolerated. Anticipate discharge tomorrow.    LOS: 3 days   [facility] Pager [phone] 12/28/2016, 8:30 AM  If 7PM-7AM, please contact night-coverage at [email or web address], password [facility]

## 2016-12-28 NOTE — Progress Notes (Addendum)
[place] [facility] Office:  [phone] General Surgery Progress Note   LOS: 3 days  POD -     Chief Complaint: Upper abdominal tenderness  Assessment and Plan: 1.  Acute diverticulitis (vs Crohn's) involving the distal transverse colon - abscess  Cipro/Flagyl  CT guided aspiration - 12/26/2016  Will need antibiotics at total of 2 weeks.  She has an appt with Dr. Drema Dallas in [facility] on Tuesday, 4/10. 2.  History of Crohn's  Perforated Crohn's descending and sigmoid colon in 2016  s/p colectomy/colostomy placement - 06/11/2015 - [facility]  Followed by Dr. Edwin Dada in [facility] - on Remicade 3.  DVT prophylaxis - SQ Hepain 4.  Sickle Cell anemia  Hgb - 7.8 - 12/28/2016 5.  Obese 6.  OSA   Principal Problem:   Abdominal abscess (HCC) Active Problems:   Morbid obesity- BMI 67.5   OSA (obstructive sleep apnea)   Diverticulitis of colon   Hepatic steatosis   Crohn's disease ([place])   Obesity hypoventilation syndrome ([place])   Pulmonary hypertension (HCC)   Chronic anemia  Subjective:  Doing okay - primary complaint is the pain in the upper abdomen near the ostomy  Objective:   Vitals:   12/27/16 2124 12/28/16 0601  BP: 115/60 120/65  Pulse: 86 85  Resp: 20 19  Temp: 97.9 F (36.6 C) 98 F (36.7 C)     Intake/Output from previous day:  04/06 0701 - 04/07 0700 In: 2850 [P.O.:1200; I.V.:1150; IV Piggyback:500] Out: 4742 [Urine:3750]  Intake/Output this shift:  No intake/output data recorded.   Physical Exam:   General: Obese AA F who is alert and oriented.    HEENT: Normal. Pupils equal. .   Lungs: Clear   Abdomen: Ostomy in LUQ.  Large midline scar with hernia.   Lab Results:    Recent Labs  12/27/16 0524 12/28/16 0459  WBC 5.3 5.7  HGB 7.2* 7.8*  HCT 25.2* 27.3*  PLT 399 420*    BMET   Recent Labs  12/27/16 0524 12/28/16 0459  NA 137 135  K 4.3 4.1  CL 107 104  CO2 24 25  GLUCOSE 85 107*  BUN 7 7  CREATININE 0.81 0.87  CALCIUM 8.7* 8.8*     PT/INR  No results for input(s): LABPROT, INR in the last 72 hours.  ABG  No results for input(s): PHART, HCO3 in the last 72 hours.  Invalid input(s): PCO2, PO2   Studies/Results:  Ct Image Guided Fluid Drain By Catheter  Result Date: 12/27/2016 INDICATION: 36 year old with presumed diverticulitis and concern for abscess collection along the left anterior abdominal wall. Patient was scheduled for CT-guided aspiration and possible drainage. EXAM: CT-GUIDED ASPIRATION OF THE LEFT ABDOMINAL FLUID COLLECTION MEDICATIONS: None ANESTHESIA/SEDATION: Fentanyl 75 mcg IV; Versed 2.0 mg IV Moderate Sedation Time:  20 minutes The patient was continuously monitored during the procedure by the interventional radiology nurse under my direct supervision. COMPLICATIONS: None immediate. PROCEDURE: Informed written consent was obtained from the patient after a thorough discussion of the procedural risks, benefits and alternatives. All questions were addressed. A timeout was performed prior to the initiation of the procedure. Patient has placed supine on the CT scanner. Images through the abdomen were obtained. The collection along the anterior left abdomen was targeted. The abdomen just medial to the ostomy was prepped with chlorhexidine and sterile field was created. Skin was anesthetized with 1% lidocaine. 18 gauge trocar needle was directed into the collection with CT guidance and approximately 1 ml  of bloody purulent fluid was obtained. Attempted to advance an Amplatz wire into the collection but the wire would not advance very far. As a result, a 5 [place] Yueh catheter was advanced over the wire and attempted to aspirate more fluid. No significant fluid was aspirated in the collection. However, some blood was aspirated as the catheter was withdrawn. Manual compression was placed over the puncture site. Bandage placed over the puncture site. FINDINGS: Morbidly obese patient with a left colostomy. Again noted is an  irregular collection along the left anterior abdominal wall just medial to the ostomy. This collection contains a small amount of gas. However, the collection appears to be slightly smaller than the CT from 12/25/2016. Only a small amount of bloody fluid could be aspirated from this area. This collection was not adequate for drain placement. IMPRESSION: CT-guided aspiration of the complex collection along the left anterior abdominal wall. Fluid was sent for culture. Electronically Signed   By: Markus Daft M.D.   On: 12/27/2016 08:14     Anti-infectives:   Anti-infectives    Start     Dose/Rate Route Frequency Ordered Stop   12/25/16 1600  metroNIDAZOLE (FLAGYL) IVPB 500 mg     500 mg 100 mL/hr over 60 Minutes Intravenous Every 8 hours 12/25/16 1300     12/25/16 1400  ciprofloxacin (CIPRO) IVPB 400 mg     400 mg 200 mL/hr over 60 Minutes Intravenous Every 12 hours 12/25/16 1300     12/25/16 0815  ceFEPIme (MAXIPIME) 2 g in dextrose 5 % 50 mL IVPB     2 g 100 mL/hr over 30 Minutes Intravenous  Once 12/25/16 0813 12/25/16 0931   12/25/16 0815  metroNIDAZOLE (FLAGYL) IVPB 500 mg     500 mg 100 mL/hr over 60 Minutes Intravenous  Once 12/25/16 0813 12/25/16 1038      Alphonsa Overall, MD, FACS Pager: [place] [facility] Office: [phone] 12/28/2016

## 2016-12-29 MED ORDER — CIPROFLOXACIN HCL 500 MG PO TABS: 500.0000 mg | ORAL_TABLET | Freq: Two times a day (BID) | ORAL | 0 refills | Status: DC

## 2016-12-29 MED ORDER — METRONIDAZOLE 500 MG PO TABS: 500.0000 mg | ORAL_TABLET | Freq: Three times a day (TID) | ORAL | 0 refills | Status: DC

## 2016-12-29 MED ORDER — SACCHAROMYCES BOULARDII 250 MG PO CAPS
250.0000 mg | ORAL_CAPSULE | Freq: Two times a day (BID) | ORAL | 0 refills | Status: DC
Start: 2016-12-29 — End: 2017-01-07

## 2016-12-29 MED ORDER — OXYCODONE HCL 5 MG PO TABS: 5.0000 mg | ORAL_TABLET | ORAL | 0 refills | Status: DC

## 2016-12-29 NOTE — Discharge Summary (Addendum)
[facility]  Physician Discharge Summary   Patient ID: Dominique [name] MRN: [id number] DOB/AGE: 05/23/1981 36 y.o.  Admit date: 12/25/2016 Discharge date: 12/29/2016  PCP: Benito Mccreedy, MD  DISCHARGE DIAGNOSES:  Principal Problem:   Abdominal abscess [facility]) Active Problems:   Morbid obesity- BMI 67.5   OSA (obstructive sleep apnea)   Diverticulitis of colon   Hepatic steatosis   Crohn's disease ([place])   Obesity hypoventilation syndrome (HCC)   Pulmonary hypertension (HCC)   Chronic anemia   RECOMMENDATIONS FOR OUTPATIENT FOLLOW UP: 1. Patient to keep her appointment with her gastroenterologist at [facility] on Tuesday. 2. Will need further evaluation as outpatient for Iron deficiency anemia    DISCHARGE CONDITION: fair  Diet recommendation: As before  Filed Weights   12/25/16 1030  Weight: (!) 182.9 kg (403 lb 3.2 oz)    INITIAL HISTORY: 36 y.o.femalewith medical history significant of Crohn's disease s/p hemicolectomy and colostomy in 2016 due to perforated sigmoid colon, on Remicaide which she last received last week on Wednesday. This is managed by gastroenterology at [facility]. She began having left sided abdominal pain about 2 wks ago. She took some Prednisone she had left over at home, maybe 1 wks worth and the pain resolved. She spoke with her GI doctor who made her and appt for next Tues and asked her to go to the ER if the pain did not improve. She presented to the emergency department with complains of abdominal pain. CT showing diverticulitis in distal transverse colon near the colostomy and a 3.5 cm abscess.   Consultants: Gen. surgery  Procedures: CT guided aspiration of the left anterior abdominal collection   HOSPITAL COURSE:   Abdominal abscess Patient is immunocompromised due to use of Remicade. Patient noted to have intra-abdominal abscess. This was thought to be secondary to diverticulitis. General surgery was consulted. Patient also  seen by interventional radiology and patient underwent CT guided aspiration. The fluid collection was noted to be smaller than previously seen. No drainage catheter was placed. Fluid culture without growth for the last 2 days. Patient was initially started on intravenous Cipro and Flagyl. Subsequently changed over to oral. Pain has improved. Continues to have some pain near her colostomy. But denies any nausea, vomiting. Has been tolerating a diet. HIV nonreactive.   ADDENDUM Abscess grew E coli resistant to Cipro. Sensitivities reviewed. Patient reports allergy to PCN but tolerated Cefepime here. Will prescribe Cefuroxime. Patient called and notified and asked to stop cipro and flagyl. Drug called in to her pharmacy.  Iron deficiency anemia Ferritin is 7. TIBC 302. B-12 level 303. Folic acid 40.9. She was given iron infusion. She will be given a prescription for iron supplements. Etiology for her iron deficiency is not entirely clear. Patient reports a long-standing history of same. Denies any symptoms associated with anemia. Hemoglobin has been stable.  Morbid obesity/Hepatic steatosis Body mass index is 73.75 kg/m.   OSA (obstructive sleep apnea) CPAP  Crohn's disease On Remicaide- last dose last week. Followed at [facility]. She has an appointment with her gastroenterologist on Tuesday.  Mild Thrombocytosis Most probably due to iron deficiency anemia. Stable.  Overall, stable. Has been ambulating with much difficulty. Okay for discharge home today. Also discussed with Dr. Lucia Gaskins with general surgery.   PERTINENT LABS:  The results of significant diagnostics from this hospitalization (including imaging, microbiology, ancillary and laboratory) are listed below for reference.    Microbiology: Recent Results (from the past 240 hour(s))  Aerobic/Anaerobic Culture (surgical/deep  wound)     Status: None (Preliminary result)   Collection Time: 12/26/16  5:21 PM  Result Value  Ref Range Status   Specimen Description ABSCESS ANTERIOR ABD ABCESS  Final   Special Requests NONE  Final   Gram Stain   Final    MODERATE WBC PRESENT, PREDOMINANTLY PMN NO ORGANISMS SEEN Performed at [facility], [place] [address] [place], [place] [address]    Culture   Final    CULTURE REINCUBATED FOR BETTER GROWTH NO ANAEROBES ISOLATED; CULTURE IN PROGRESS FOR 5 DAYS    Report Status PENDING  Incomplete     Labs: Basic Metabolic Panel:  Recent Labs Lab 12/25/16 0625 12/26/16 0447 12/27/16 0524 12/28/16 0459  NA 139 135 137 135  K 4.1 4.4 4.3 4.1  CL 105 105 107 104  CO2 27 23 24 25   GLUCOSE 96 85 85 107*  BUN 15 9 7 7   CREATININE 1.03* 0.86 0.81 0.87  CALCIUM 8.6* 8.3* 8.7* 8.8*   Liver Function Tests:  Recent Labs Lab 12/25/16 0625  AST 11*  ALT 9*  ALKPHOS 59  BILITOT 0.4  PROT 7.3  ALBUMIN 2.8*    Recent Labs Lab 12/25/16 0625  LIPASE 15   CBC:  Recent Labs Lab 12/25/16 0625 12/26/16 0615 12/27/16 0524 12/28/16 0459  WBC 8.6 6.9 5.3 5.7  NEUTROABS 5.4  --   --   --   HGB 8.1* 7.4* 7.2* 7.8*  HCT 28.0* 25.7* 25.2* 27.3*  MCV 67.1* 65.2* 65.3* 65.5*  PLT 423* 417* 399 420*    IMAGING STUDIES Ct Abdomen Pelvis W Contrast  Result Date: 12/25/2016 CLINICAL DATA:  Pain around colostomy site. History of diverticulitis. Sickle cell disease. EXAM: CT ABDOMEN AND PELVIS WITH CONTRAST TECHNIQUE: Multidetector CT imaging of the abdomen and pelvis was performed using the standard protocol following bolus administration of intravenous contrast. CONTRAST:  169m ISOVUE-300 IOPAMIDOL (ISOVUE-300) INJECTION 61% COMPARISON:  08/08/2015 FINDINGS: Examination is limited due to morbid obesity. Lower chest: The lung bases are clear. Mild stable cardiac enlargement but no pericardial effusion. Hepatobiliary: No focal hepatic lesions or intrahepatic biliary dilatation. Gallbladder is grossly normal. No common bile duct dilatation. Pancreas: Grossly normal.  No  findings for acute pancreatitis. Spleen: Within normal limits in size.  No focal lesions. Adrenals/Urinary Tract: The adrenal glands and kidneys are grossly normal. No renal lesions or hydronephrosis. Stomach/Bowel: The stomach, duodenum, small bowel and colon are grossly normal. The the patient does have a left lower quadrant colostomy and there appears to be significant inflammation in the fat around the colostomy site suggesting cellulitis. There is also an abscess located along the medial and superior aspect of the colostomy site. The distal transverse colon appears inflamed with wall thickening and submucosal edema. This is likely diverticulitis as it enters the colostomy site with a adjacent diverticular abscess. Knee sigmoid colon/ Hartmann's pouch appears normal. No acute inflammatory changes. The ascending colon is grossly normal. No small bowel abnormality is identified. No obstructive findings. Vascular/Lymphatic: The aorta and branch vessels appear normal. The major venous structures are patent. The no mesenteric or retroperitoneal mass or adenopathy. Small scattered lymph nodes are stable. Reproductive: The uterus and ovaries are unremarkable. Other: No free air. No pelvic fluid collections. Stable anterior abdominal wall hernia containing fat in the transverse colon. No inguinal mass or hernia. No subcutaneous lesions Musculoskeletal: No significant bony findings. IMPRESSION: 1. Acute diverticulitis involving the distal transverse colon near its entry point into the left  lower quadrant colostomy. There is associated marked inflammation of the subcutaneous fat, cellulitis and a 3.5 cm diverticular abscess which is intraabdominal. 2. No other significant intra-abdominal findings. Electronically Signed   By: Marijo Sanes M.D.   On: 12/25/2016 07:56   Ct Image Guided Fluid Drain By Catheter  Result Date: 12/27/2016 INDICATION: 36 year old with presumed diverticulitis and concern for abscess collection  along the left anterior abdominal wall. Patient was scheduled for CT-guided aspiration and possible drainage. EXAM: CT-GUIDED ASPIRATION OF THE LEFT ABDOMINAL FLUID COLLECTION MEDICATIONS: None ANESTHESIA/SEDATION: Fentanyl 75 mcg IV; Versed 2.0 mg IV Moderate Sedation Time:  20 minutes The patient was continuously monitored during the procedure by the interventional radiology nurse under my direct supervision. COMPLICATIONS: None immediate. PROCEDURE: Informed written consent was obtained from the patient after a thorough discussion of the procedural risks, benefits and alternatives. All questions were addressed. A timeout was performed prior to the initiation of the procedure. Patient has placed supine on the CT scanner. Images through the abdomen were obtained. The collection along the anterior left abdomen was targeted. The abdomen just medial to the ostomy was prepped with chlorhexidine and sterile field was created. Skin was anesthetized with 1% lidocaine. 18 gauge trocar needle was directed into the collection with CT guidance and approximately 1 ml of bloody purulent fluid was obtained. Attempted to advance an Amplatz wire into the collection but the wire would not advance very far. As a result, a 5 [place] Yueh catheter was advanced over the wire and attempted to aspirate more fluid. No significant fluid was aspirated in the collection. However, some blood was aspirated as the catheter was withdrawn. Manual compression was placed over the puncture site. Bandage placed over the puncture site. FINDINGS: Morbidly obese patient with a left colostomy. Again noted is an irregular collection along the left anterior abdominal wall just medial to the ostomy. This collection contains a small amount of gas. However, the collection appears to be slightly smaller than the CT from 12/25/2016. Only a small amount of bloody fluid could be aspirated from this area. This collection was not adequate for drain placement.  IMPRESSION: CT-guided aspiration of the complex collection along the left anterior abdominal wall. Fluid was sent for culture. Electronically Signed   By: Markus Daft M.D.   On: 12/27/2016 08:14    DISCHARGE EXAMINATION: Vitals:   12/28/16 0601 12/28/16 1311 12/28/16 2105 12/29/16 0541  BP: 120/65 123/88 (!) 107/53 (!) 95/54  Pulse: 85 89 91 93  Resp: 19 16 17 17   Temp: 98 F (36.7 C) 98.3 F (36.8 C) 98 F (36.7 C) 98.6 F (37 C)  TempSrc: Oral Oral Oral Oral  SpO2: 100% 93% 98% 97%  Weight:      Height:       General appearance: alert, cooperative, appears stated age and morbidly obese Resp: clear to auscultation bilaterally Cardio: regular rate and rhythm, S1, S2 normal, no murmur, click, rub or gallop GI: Abdomen is obese. Difficult exam due to size. Mildly tender around the ostomy. No erythema or drainage noted. No rebound, rigidity or guarding. Colostomy is noted. Extremities: extremities normal, atraumatic, no cyanosis or edema  DISPOSITION: Home  Discharge Instructions    Call MD for:  difficulty breathing, headache or visual disturbances    Complete by:  As directed    Call MD for:  extreme fatigue    Complete by:  As directed    Call MD for:  persistant dizziness or light-headedness    Complete  by:  As directed    Call MD for:  persistant nausea and vomiting    Complete by:  As directed    Call MD for:  severe uncontrolled pain    Complete by:  As directed    Call MD for:  temperature >100.4    Complete by:  As directed    Diet - low sodium heart healthy    Complete by:  As directed    Discharge instructions    Complete by:  As directed    Please be sure to follow-up with your gastroenterologist/surgeon at [facility] this Tuesday as scheduled. Taking medications as prescribed. Seek attention if abdominal pain worsens or if you develop high fevers or nausea and vomiting.  You were cared for by a hospitalist during your hospital stay. If you have any  questions about your discharge medications or the care you received while you were in the hospital after you are discharged, you can call the unit and asked to speak with the hospitalist on call if the hospitalist that took care of you is not available. Once you are discharged, your primary care physician will handle any further medical issues. Please note that NO REFILLS for any discharge medications will be authorized once you are discharged, as it is imperative that you return to your primary care physician (or establish a relationship with a primary care physician if you do not have one) for your aftercare needs so that they can reassess your need for medications and monitor your lab values. If you do not have a primary care physician, you can call [phone] for a physician referral.   Increase activity slowly    Complete by:  As directed       ALLERGIES:  Allergies  Allergen Reactions  . Other Shortness Of Breath and Swelling    Tree nuts  . Penicillins Other (See Comments)    Unknown childhood allergy     Current Discharge Medication List    START taking these medications   Details  ciprofloxacin (CIPRO) 500 MG tablet Take 1 tablet (500 mg total) by mouth 2 (two) times daily. Qty: 20 tablet, Refills: 0    ferrous sulfate 325 (65 FE) MG tablet Take 1 tablet (325 mg total) by mouth 2 (two) times daily with a meal. Qty: 60 tablet, Refills: 3    metroNIDAZOLE (FLAGYL) 500 MG tablet Take 1 tablet (500 mg total) by mouth every 8 (eight) hours. Qty: 30 tablet, Refills: 0    oxyCODONE (OXY IR/ROXICODONE) 5 MG immediate release tablet Take 1 tablet (5 mg total) by mouth every 4 (four) hours. Qty: 15 tablet, Refills: 0    saccharomyces boulardii (FLORASTOR) 250 MG capsule Take 1 capsule (250 mg total) by mouth 2 (two) times daily. Qty: 30 capsule, Refills: 0      CONTINUE these medications which have NOT CHANGED   Details  acetaminophen (TYLENOL) 500 MG tablet Take 1,000 mg by mouth  every 6 (six) hours as needed for mild pain.    inFLIXimab (REMICADE) 100 MG injection Inject 100 mg into the vein as directed.    PROAIR HFA 108 (90 BASE) MCG/ACT inhaler INHALE 2 PUFFS BY MOUTH INTO THE LUNGS EVERY 6 HOURS AS NEEDED FOR WHEEZING OR SHORTNESS OF BREATH Qty: 8.5 g, Refills: 3      STOP taking these medications     ibuprofen (ADVIL,MOTRIN) 200 MG tablet      methocarbamol (ROBAXIN) 500 MG tablet  Follow-up Information    OSEI-BONSU,GEORGE, MD. Schedule an appointment as soon as possible for a visit in 1 week(s).   Specialty:  Internal Medicine Contact information: [place] [phone]           TOTAL DISCHARGE TIME: 27 mins  [facility] Pager [phone]  12/29/2016, 12:37 PM

## 2016-12-29 NOTE — Progress Notes (Signed)
Nurse reviewed discharge instructions with pt.  Pt verbalized understanding of discharge instructions, follow up appointments and new medications.  Prescriptions along with coupon given to pt prior to discharge.

## 2016-12-29 NOTE — Progress Notes (Signed)
[facility] Office:  [phone] General Surgery Progress Note   LOS: 4 days  POD -     Chief Complaint: Upper abdominal tenderness near ostomy  Assessment and Plan: 1.  Acute diverticulitis (vs Crohn's) involving the distal transverse colon - abscess  Cipro/Flagyl  CT guided aspiration - 12/26/2016  Will need antibiotics at total of 2 weeks.  She has an appt with Dr. Drema Dallas in [facility] on Tuesday, 4/10.  Not much change - needs to walk more - she got in the hall only once yesterday   2.  History of Crohn's  Perforated Crohn's descending and sigmoid colon in 2016  s/p colectomy/colostomy placement - 06/11/2015 - [facility]  Followed by Dr. Edwin Dada in [facility] - on Remicade 3.  DVT prophylaxis - SQ Hepain 4.  Sickle Cell anemia  Hgb - 7.8 - 12/28/2016 5.  Obese 6.  OSA   Principal Problem:   Abdominal abscess (HCC) Active Problems:   Morbid obesity- BMI 67.5   OSA (obstructive sleep apnea)   Diverticulitis of colon   Hepatic steatosis   Crohn's disease ([place])   Obesity hypoventilation syndrome (HCC)   Pulmonary hypertension (HCC)   Chronic anemia  Subjective:  Primary complaint is the pain in the upper abdomen near the ostomy - about the sam as yesterday  Objective:   Vitals:   12/28/16 2105 12/29/16 0541  BP: (!) 107/53 (!) 95/54  Pulse: 91 93  Resp: 17 17  Temp: 98 F (36.7 C) 98.6 F (37 C)     Intake/Output from previous day:  04/07 0701 - 04/08 0700 In: 840 [P.O.:840] Out: 3250 [Urine:3200; Stool:50]  Intake/Output this shift:  No intake/output data recorded.   Physical Exam:   General: Obese AA F who is alert and oriented.    HEENT: Normal. Pupils equal. .   Lungs: Clear   Abdomen: Ostomy in LUQ.  Large midline scar with hernia.  Tenderness near the ostomy.   Lab Results:     Recent Labs  12/27/16 0524 12/28/16 0459  WBC 5.3 5.7  HGB 7.2* 7.8*  HCT 25.2* 27.3*  PLT 399 420*    BMET    Recent Labs  12/27/16 0524  12/28/16 0459  NA 137 135  K 4.3 4.1  CL 107 104  CO2 24 25  GLUCOSE 85 107*  BUN 7 7  CREATININE 0.81 0.87  CALCIUM 8.7* 8.8*    PT/INR  No results for input(s): LABPROT, INR in the last 72 hours.  ABG  No results for input(s): PHART, HCO3 in the last 72 hours.  Invalid input(s): PCO2, PO2   Studies/Results:  No results found.   Anti-infectives:   Anti-infectives    Start     Dose/Rate Route Frequency Ordered Stop   12/28/16 1700  ciprofloxacin (CIPRO) tablet 500 mg     500 mg Oral 2 times daily 12/28/16 1045     12/28/16 1400  metroNIDAZOLE (FLAGYL) tablet 500 mg     500 mg Oral Every 8 hours 12/28/16 1045     12/25/16 1600  metroNIDAZOLE (FLAGYL) IVPB 500 mg  Status:  Discontinued     500 mg 100 mL/hr over 60 Minutes Intravenous Every 8 hours 12/25/16 1300 12/28/16 1045   12/25/16 1400  ciprofloxacin (CIPRO) IVPB 400 mg  Status:  Discontinued     400 mg 200 mL/hr over 60 Minutes Intravenous Every 12 hours 12/25/16 1300 12/28/16 1045   12/25/16 0815  ceFEPIme (MAXIPIME) 2 g in  dextrose 5 % 50 mL IVPB     2 g 100 mL/hr over 30 Minutes Intravenous  Once 12/25/16 0813 12/25/16 0931   12/25/16 0815  metroNIDAZOLE (FLAGYL) IVPB 500 mg     500 mg 100 mL/hr over 60 Minutes Intravenous  Once 12/25/16 0813 12/25/16 1038      Alphonsa Overall, MD, FACS Pager: [facility] Office: [phone] 12/29/2016

## 2016-12-29 NOTE — Care Management Note (Addendum)
Case Management Note  Patient Details  Name: Dominique [name] MRN: [id number] Date of Birth: 1981/05/10  Subjective/Objective:     Abdominal abscess-related to diverticulitis, iron deficiency anemia, morbid obesity, OSA              Action/Plan: Discharge Planning: NCM spoke to pt and states she does not understand why her Medicaid was switched to family. States she received a card in the mail in Feb. Faxed Rx to [facility] to see if Medicaid is active. But case price for Cipro is $5 and Flagyl is $11 at [facility] with goodrx coupon.   PCP Benito Mccreedy MD   Spoke to [facility] and medications went through under Medicaid. Copay is $3.    Expected Discharge Date:12/29/2016            Expected Discharge Plan:  Home/Self Care  In-House Referral:  NA  Discharge planning Services  CM Consult, Medication Assistance  Post Acute Care Choice:  NA Choice offered to:  NA  DME Arranged:  N/A DME Agency:  NA  HH Arranged:  NA HH Agency:  NA  Status of Service:  Completed, signed off  If discussed at [place] of Stay Meetings, dates discussed:    Additional Comments:  Erenest Rasher, RN 12/29/2016, 11:27 AM

## 2016-12-30 ENCOUNTER — Telehealth: Payer: Self-pay | Admitting: Internal Medicine

## 2016-12-30 ENCOUNTER — Other Ambulatory Visit: Payer: Medicaid Other

## 2016-12-30 NOTE — Telephone Encounter (Signed)
Abscess grew E coli resistant to Cipro. Sensitivities reviewed. Patient reports allergy to PCN but tolerated Cefepime here. Will prescribe Cefuroxime. Patient called and notified and asked to stop cipro and flagyl. Drug called in to her pharmacy.

## 2016-12-31 LAB — AEROBIC/ANAEROBIC CULTURE (SURGICAL/DEEP WOUND)

## 2016-12-31 LAB — AEROBIC/ANAEROBIC CULTURE W GRAM STAIN (SURGICAL/DEEP WOUND)

## 2017-01-06 ENCOUNTER — Encounter (HOSPITAL_COMMUNITY): Payer: Self-pay

## 2017-01-06 ENCOUNTER — Emergency Department (HOSPITAL_COMMUNITY): Payer: Medicaid Other

## 2017-01-06 ENCOUNTER — Observation Stay (HOSPITAL_COMMUNITY)
Admission: EM | Admit: 2017-01-06 | Discharge: 2017-01-07 | Disposition: A | Discharge status: Home / Self Care | Payer: Medicaid Other | Attending: Internal Medicine | Admitting: Internal Medicine

## 2017-01-06 DIAGNOSIS — B999 Unspecified infectious disease: Secondary | ICD-10-CM | POA: Diagnosis not present

## 2017-01-06 DIAGNOSIS — K5732 Diverticulitis of large intestine without perforation or abscess without bleeding: Secondary | ICD-10-CM | POA: Diagnosis not present

## 2017-01-06 DIAGNOSIS — I509 Heart failure, unspecified: Secondary | ICD-10-CM | POA: Diagnosis not present

## 2017-01-06 DIAGNOSIS — Z79899 Other long term (current) drug therapy: Secondary | ICD-10-CM | POA: Insufficient documentation

## 2017-01-06 DIAGNOSIS — E662 Morbid (severe) obesity with alveolar hypoventilation: Secondary | ICD-10-CM | POA: Diagnosis present

## 2017-01-06 DIAGNOSIS — Z6841 Body Mass Index (BMI) 40.0 and over, adult: Secondary | ICD-10-CM | POA: Insufficient documentation

## 2017-01-06 DIAGNOSIS — Z22358 Carrier of other enterobacterales: Secondary | ICD-10-CM

## 2017-01-06 DIAGNOSIS — Z79891 Long term (current) use of opiate analgesic: Secondary | ICD-10-CM | POA: Insufficient documentation

## 2017-01-06 DIAGNOSIS — Z221 Carrier of other intestinal infectious diseases: Secondary | ICD-10-CM

## 2017-01-06 DIAGNOSIS — D649 Anemia, unspecified: Secondary | ICD-10-CM | POA: Diagnosis not present

## 2017-01-06 DIAGNOSIS — I2510 Atherosclerotic heart disease of native coronary artery without angina pectoris: Secondary | ICD-10-CM | POA: Insufficient documentation

## 2017-01-06 DIAGNOSIS — K50119 Crohn's disease of large intestine with unspecified complications: Secondary | ICD-10-CM

## 2017-01-06 DIAGNOSIS — Z880 Allergy status to penicillin: Secondary | ICD-10-CM | POA: Insufficient documentation

## 2017-01-06 DIAGNOSIS — Z9989 Dependence on other enabling machines and devices: Secondary | ICD-10-CM

## 2017-01-06 DIAGNOSIS — Z933 Colostomy status: Secondary | ICD-10-CM | POA: Insufficient documentation

## 2017-01-06 DIAGNOSIS — Z9049 Acquired absence of other specified parts of digestive tract: Secondary | ICD-10-CM | POA: Insufficient documentation

## 2017-01-06 DIAGNOSIS — G4733 Obstructive sleep apnea (adult) (pediatric): Secondary | ICD-10-CM | POA: Diagnosis present

## 2017-01-06 DIAGNOSIS — D573 Sickle-cell trait: Secondary | ICD-10-CM | POA: Diagnosis not present

## 2017-01-06 DIAGNOSIS — F4323 Adjustment disorder with mixed anxiety and depressed mood: Secondary | ICD-10-CM | POA: Diagnosis not present

## 2017-01-06 DIAGNOSIS — Z79620 Long term (current) use of immunosuppressive biologic: Secondary | ICD-10-CM

## 2017-01-06 DIAGNOSIS — L02211 Cutaneous abscess of abdominal wall: Secondary | ICD-10-CM

## 2017-01-06 DIAGNOSIS — K50114 Crohn's disease of large intestine with abscess: Secondary | ICD-10-CM | POA: Diagnosis not present

## 2017-01-06 DIAGNOSIS — K5720 Diverticulitis of large intestine with perforation and abscess without bleeding: Secondary | ICD-10-CM | POA: Insufficient documentation

## 2017-01-06 DIAGNOSIS — K50118 Crohn's disease of large intestine with other complication: Secondary | ICD-10-CM | POA: Diagnosis present

## 2017-01-06 DIAGNOSIS — K631 Perforation of intestine (nontraumatic): Secondary | ICD-10-CM | POA: Diagnosis not present

## 2017-01-06 DIAGNOSIS — K435 Parastomal hernia without obstruction or  gangrene: Secondary | ICD-10-CM

## 2017-01-06 DIAGNOSIS — J45909 Unspecified asthma, uncomplicated: Secondary | ICD-10-CM | POA: Diagnosis not present

## 2017-01-06 DIAGNOSIS — Z791 Long term (current) use of non-steroidal anti-inflammatories (NSAID): Secondary | ICD-10-CM | POA: Insufficient documentation

## 2017-01-06 DIAGNOSIS — I110 Hypertensive heart disease with heart failure: Secondary | ICD-10-CM | POA: Insufficient documentation

## 2017-01-06 DIAGNOSIS — R109 Unspecified abdominal pain: Secondary | ICD-10-CM | POA: Insufficient documentation

## 2017-01-06 DIAGNOSIS — K5792 Diverticulitis of intestine, part unspecified, without perforation or abscess without bleeding: Secondary | ICD-10-CM | POA: Insufficient documentation

## 2017-01-06 DIAGNOSIS — Z433 Encounter for attention to colostomy: Secondary | ICD-10-CM

## 2017-01-06 LAB — COMPREHENSIVE METABOLIC PANEL
ALBUMIN: 3.5 g/dL (ref 3.5–5.0)
ALT: 9 U/L — ABNORMAL LOW (ref 14–54)
ANION GAP: 9 (ref 5–15)
AST: 14 U/L — ABNORMAL LOW (ref 15–41)
Alkaline Phosphatase: 63 U/L (ref 38–126)
BUN: 11 mg/dL (ref 6–20)
CHLORIDE: 102 mmol/L (ref 101–111)
CO2: 23 mmol/L (ref 22–32)
Calcium: 8.9 mg/dL (ref 8.9–10.3)
Creatinine, Ser: 0.97 mg/dL (ref 0.44–1.00)
GFR calc non Af Amer: >60 mL/min (ref >60)
GLUCOSE: 90 mg/dL (ref 65–99)
POTASSIUM: 4.2 mmol/L (ref 3.5–5.1)
SODIUM: 134 mmol/L — AB (ref 135–145)
Total Bilirubin: 0.8 mg/dL (ref 0.3–1.2)
Total Protein: 8.4 g/dL — ABNORMAL HIGH (ref 6.5–8.1)

## 2017-01-06 LAB — URINALYSIS, ROUTINE W REFLEX MICROSCOPIC
BILIRUBIN URINE: NEGATIVE
GLUCOSE, UA: NEGATIVE mg/dL
KETONES UR: NEGATIVE mg/dL
Nitrite: NEGATIVE
PH: 5 (ref 5.0–8.0)
Protein, ur: 100 mg/dL — AB
Specific Gravity, Urine: 1.017 (ref 1.005–1.030)
Squamous Epithelial / LPF: NONE SEEN

## 2017-01-06 LAB — CBC
HEMATOCRIT: 34.7 % — AB (ref 36.0–46.0)
HEMOGLOBIN: 10.2 g/dL — AB (ref 12.0–15.0)
MCH: 20.4 pg — AB (ref 26.0–34.0)
MCHC: 29.4 g/dL — AB (ref 30.0–36.0)
MCV: 69.3 fL — ABNORMAL LOW (ref 78.0–100.0)
Platelets: 368 10*3/uL (ref 150–400)
RBC: 5.01 MIL/uL (ref 3.87–5.11)
RDW: 21.3 % — ABNORMAL HIGH (ref 11.5–15.5)
WBC: 7.6 10*3/uL (ref 4.0–10.5)

## 2017-01-06 LAB — LIPASE, BLOOD: LIPASE: 15 U/L (ref 11–51)

## 2017-01-06 MED ORDER — METRONIDAZOLE IN NACL 5-0.79 MG/ML-% IV SOLN: 500.0000 mg | Freq: Three times a day (TID) | INTRAVENOUS | Status: DC

## 2017-01-06 MED ORDER — IOPAMIDOL (ISOVUE-300) INJECTION 61%
100.0000 mL | Freq: Once | INTRAVENOUS | Status: AC | PRN
  Administered 2017-01-06: 100 mL via INTRAVENOUS

## 2017-01-06 MED ORDER — SODIUM CHLORIDE 0.9 % IV BOLUS (SEPSIS)
1000.0000 mL | Freq: Once | INTRAVENOUS | Status: AC
  Administered 2017-01-06: 1000 mL via INTRAVENOUS

## 2017-01-06 MED ORDER — OXYCODONE HCL 5 MG PO TABS
5.0000 mg | ORAL_TABLET | ORAL | Status: DC | PRN
  Administered 2017-01-06: 5 mg via ORAL
  Filled 2017-01-06: qty 1

## 2017-01-06 MED ORDER — MORPHINE SULFATE (PF) 4 MG/ML IV SOLN: 2.0000 mg | INTRAVENOUS | 0 refills | Status: DC | PRN

## 2017-01-06 MED ORDER — DEXTROSE 5 % IV SOLN: 2.0000 g | Freq: Three times a day (TID) | INTRAVENOUS | Status: DC

## 2017-01-06 MED ORDER — ALBUTEROL SULFATE (2.5 MG/3ML) 0.083% IN NEBU: 2.5000 mg | INHALATION_SOLUTION | RESPIRATORY_TRACT | Status: DC | PRN

## 2017-01-06 MED ORDER — HYDROMORPHONE HCL 2 MG/ML IJ SOLN
1.0000 mg | Freq: Once | INTRAMUSCULAR | Status: AC
  Administered 2017-01-06: 1 mg via INTRAVENOUS
  Filled 2017-01-06: qty 1

## 2017-01-06 MED ORDER — ONDANSETRON HCL 4 MG PO TABS: 4.0000 mg | ORAL_TABLET | Freq: Four times a day (QID) | ORAL | Status: DC | PRN

## 2017-01-06 MED ORDER — MORPHINE SULFATE (PF) 4 MG/ML IV SOLN
2.0000 mg | INTRAVENOUS | Status: DC | PRN
  Administered 2017-01-06: 4 mg via INTRAVENOUS
  Administered 2017-01-06: 2 mg via INTRAVENOUS
  Filled 2017-01-06 (×2): qty 1

## 2017-01-06 MED ORDER — ENOXAPARIN SODIUM 80 MG/0.8ML ~~LOC~~ SOLN: 80.0000 mg | SUBCUTANEOUS | Status: DC

## 2017-01-06 MED ORDER — DEXTROSE 5 % IV SOLN
2.0000 g | Freq: Three times a day (TID) | INTRAVENOUS | Status: DC
  Administered 2017-01-06: 2 g via INTRAVENOUS
  Filled 2017-01-06 (×3): qty 2

## 2017-01-06 MED ORDER — CEFEPIME HCL 2 G IJ SOLR
2.0000 g | Freq: Once | INTRAMUSCULAR | Status: AC
  Administered 2017-01-06: 2 g via INTRAVENOUS
  Filled 2017-01-06: qty 2

## 2017-01-06 MED ORDER — ONDANSETRON HCL 4 MG/2ML IJ SOLN: 4.0000 mg | Freq: Four times a day (QID) | INTRAMUSCULAR | Status: DC | PRN

## 2017-01-06 MED ORDER — SODIUM CHLORIDE 0.9 % IV SOLN
INTRAVENOUS | Status: DC
  Administered 2017-01-06: 100 mL/h via INTRAVENOUS
  Administered 2017-01-06: 09:00:00 via INTRAVENOUS

## 2017-01-06 MED ORDER — ONDANSETRON HCL 4 MG/2ML IJ SOLN
4.0000 mg | Freq: Once | INTRAMUSCULAR | Status: AC
  Administered 2017-01-06: 4 mg via INTRAVENOUS
  Filled 2017-01-06: qty 2

## 2017-01-06 MED ORDER — IOPAMIDOL (ISOVUE-300) INJECTION 61%
INTRAVENOUS | Status: AC
  Administered 2017-01-06: 100 mL via INTRAVENOUS
  Filled 2017-01-06: qty 100

## 2017-01-06 MED ORDER — ONDANSETRON HCL 4 MG/2ML IJ SOLN: 4.0000 mg | Freq: Four times a day (QID) | INTRAMUSCULAR | 0 refills | Status: DC | PRN

## 2017-01-06 MED ORDER — METRONIDAZOLE IN NACL 5-0.79 MG/ML-% IV SOLN
500.0000 mg | Freq: Once | INTRAVENOUS | Status: AC
  Administered 2017-01-06: 500 mg via INTRAVENOUS
  Filled 2017-01-06: qty 100

## 2017-01-06 MED ORDER — METRONIDAZOLE IN NACL 5-0.79 MG/ML-% IV SOLN
500.0000 mg | Freq: Three times a day (TID) | INTRAVENOUS | Status: DC
  Administered 2017-01-06: 500 mg via INTRAVENOUS
  Filled 2017-01-06: qty 100

## 2017-01-06 MED ORDER — ACETAMINOPHEN 650 MG RE SUPP: 650.0000 mg | Freq: Four times a day (QID) | RECTAL | Status: DC | PRN

## 2017-01-06 MED ORDER — OXYCODONE HCL 5 MG PO TABS: 5.0000 mg | ORAL_TABLET | ORAL | Status: DC

## 2017-01-06 MED ORDER — ACETAMINOPHEN 325 MG PO TABS: 650.0000 mg | ORAL_TABLET | Freq: Four times a day (QID) | ORAL | Status: DC | PRN

## 2017-01-06 MED ORDER — HYDROMORPHONE HCL 2 MG/ML IJ SOLN: 1.0000 mg | Freq: Once | INTRAMUSCULAR | Status: DC

## 2017-01-06 NOTE — Progress Notes (Signed)
Pharmacy Antibiotic Note  Dominique [name] is a 36 y.o. female admitted on 01/06/2017 with Diverticulitis.  Pharmacy has been consulted for Cefepime dosing.  Plan: Cefepime 2gm iv q8hr  Height: 5' 2"  (157.5 cm) Weight: (!) 393 lb (178.3 kg) IBW/kg (Calculated) : 50.1  Temp (24hrs), Avg:98.9 F (37.2 C), Min:98.9 F (37.2 C), Max:98.9 F (37.2 C)   Recent Labs Lab 01/06/17 0113  WBC 7.6  CREATININE 0.97    Estimated Creatinine Clearance: 128.3 mL/min (by C-G formula based on SCr of 0.97 mg/dL).    Allergies  Allergen Reactions  . Other Shortness Of Breath and Swelling    Tree nuts  . Penicillins Other (See Comments)    Unknown childhood allergy    Antimicrobials this admission: Cefepime 01/06/2017 >>   Dose adjustments this admission: -  Microbiology results: pending  Thank you for allowing pharmacy to be a part of this patient's care.  [name] 01/06/2017 6:44 AM

## 2017-01-06 NOTE — H&P (Signed)
History and Physical    Dominique [name] CHY:[id number] DOB: Apr 27, 1981 DOA: 01/06/2017  Referring MD/NP/PA: Park Pope, PA-C PCP: Benito Mccreedy, MD  Patient coming from: home  Chief Complaint: Abdominal pain  HPI: Dominique [name] is a 36 y.o. female with medical history significant of Crohn's disease on Remicade s/p hemicolectomy and colostomy in 2016 due to perforation of the sigmoid colon, HTN, morbid obesity, OSA not using CPAP; who presents with abdominal pain. Patient was just recently hospitalized from 4/4 through 4/8 at [facility] for abdominal pain after being found to have a 3.5 cm abscess and signs of diverticulitis in the transverse colon. Patient was initially treated with Cipro and Flagyl. She underwent interventional radiology drainage of abscess and cultures were sent. She was discharged home initially on ciprofloxacin and Flagyl, but abscess grew cultures grew Escherichia coli resistant to ciprofloxacin. Patient was called the following day after her dischargeand asked to stop ciprofloxacin and Flagyl and start cefuroxime.  Patient reports picking up the new medication on 4/10 and reports taking it up until 2 days ago. Patient reports recurrence of the abdominal pain near her colostomy site that was sharp and progressively worsening. pain seemed to be worsened with deep breaths inspiratory breaths and movement. Denies any change from ostomy output, nausea, vomiting, fever, or chills. She tried using home oxycodone without relief of symptoms.   ED Course: On admission into the emergency department patient was seen to be afebrile with heart rates 94-110, and all other vitals are relatively within normal limits. Repeat CT scan of the abdomen revealed acute inflammatory changes along the distal transverse colon and tiny gas bubble that could represent a be developing abscess. Patient was started on IV antibiotics of cefepime and metronidazole in ED.   Review of Systems: As per HPI  otherwise 10 point review of systems negative.   Past Medical History:  Diagnosis Date  . Acute acalculous cholecystitis 11/16/2014  . Anemia   . Anxiety   . Asthma   . CHF (congestive heart failure) ([place])   . Complication of anesthesia    states she had a problem staying awake after her c-section, was transferred from [facility] to [facility]  . Coronary artery disease   . Diverticulitis of colon 11/17/2014  . Headache    used to have migraines  . Hepatic steatosis 11/17/2014  . Hypertension   . Morbid obesity ([place]) 03/22/2009   Qualifier: Diagnosis of  By: Ronnald Ramp CMA, [place]    . Neuropathy   . OSA (obstructive sleep apnea) 05/02/2014  . Seasonal allergies 06/13/2014  . Sickle cell anemia ([place])    She states she " has the trait" (07/20/15)  . Sleep apnea    uses Bipap  . Thyromegaly 05/02/2014    Past Surgical History:  Procedure Laterality Date  . c section 2011  2011  . COLONOSCOPY WITH PROPOFOL N/A 12/27/2015   Procedure: COLONOSCOPY WITH PROPOFOL;  Surgeon: Wonda Horner, MD;  Location: [facility] ENDOSCOPY;  Service: Endoscopy;  Laterality: N/A;  . COLOSTOMY N/A 06/11/2015   Procedure:  CREATION OF COLOSTOMY;  Surgeon: Georganna Skeans, MD;  Location: [place];  Service: General;  Laterality: N/A;  . ESOPHAGOGASTRODUODENOSCOPY N/A 07/19/2015   Procedure: ESOPHAGOGASTRODUODENOSCOPY (EGD);  Surgeon: Clarene Essex, MD;  Location: [facility] ENDOSCOPY;  Service: Endoscopy;  Laterality: N/A;  . FLEXIBLE SIGMOIDOSCOPY N/A 06/05/2015   Procedure: FLEXIBLE SIGMOIDOSCOPY;  Surgeon: Ronald Lobo, MD;  Location: [facility] ENDOSCOPY;  Service: Endoscopy;  Laterality: N/A;  . LAPAROTOMY N/A 06/09/2015   Procedure: EXPLORATORY  LAPAROTOMY, DRAINAGE OF INTRAABDOMINAL ABSCESS, PARTIAL COLON RESECTION,  APPLICATION OF WOUND VAC;  Surgeon: Georganna Skeans, MD;  Location: [place];  Service: General;  Laterality: N/A;  . LAPAROTOMY N/A 06/11/2015   Procedure: RE-EXPLORATION OF ABDOMEN;  Surgeon: Georganna Skeans, MD;  Location: [place];   Service: General;  Laterality: N/A;     reports that she quit smoking about 19 months ago. Her smoking use included Cigarettes. She started smoking about 2 years ago. She has a 2.00 pack-year smoking history. She has never used smokeless tobacco. She reports that she does not drink alcohol or use drugs.  Allergies  Allergen Reactions  . Other Shortness Of Breath and Swelling    Tree nuts  . Penicillins Other (See Comments)    Unknown childhood allergy    Family History  Problem Relation Age of Onset  . Hypertension Mother   . Allergies Mother   . Hypertension Father   . Cancer Paternal Grandmother     lung  . Hypertension Sister   . Asthma Brother   . Diabetes Maternal Grandmother   . Hypertension Maternal Grandmother   . Hypertension Maternal Grandfather   . Emphysema Paternal Grandfather     Prior to Admission medications   Medication Sig Start Date End Date Taking? Authorizing Provider  acetaminophen (TYLENOL) 500 MG tablet Take 1,000 mg by mouth every 6 (six) hours as needed for mild pain.   Yes Historical Provider, MD  cefUROXime (CEFTIN) 500 MG tablet Take 1 tablet by mouth 2 (two) times daily. 12/30/16  Yes Historical Provider, MD  ibuprofen (ADVIL,MOTRIN) 200 MG tablet Take 600 mg by mouth every 4 (four) hours as needed for moderate pain.   Yes Historical Provider, MD  inFLIXimab (REMICADE) 100 MG injection Inject 100 mg into the vein as directed.   Yes Historical Provider, MD  oxyCODONE (OXY IR/ROXICODONE) 5 MG immediate release tablet Take 1 tablet (5 mg total) by mouth every 4 (four) hours. 12/29/16  Yes Bonnielee Haff, MD  PROAIR HFA 108 (90 BASE) MCG/ACT inhaler INHALE 2 PUFFS BY MOUTH INTO THE LUNGS EVERY 6 HOURS AS NEEDED FOR WHEEZING OR SHORTNESS OF BREATH 02/17/15  Yes Midge Minium, MD  ciprofloxacin (CIPRO) 500 MG tablet Take 1 tablet (500 mg total) by mouth 2 (two) times daily. Patient not taking: Reported on 01/06/2017 12/29/16 01/08/17  Bonnielee Haff, MD    ferrous sulfate 325 (65 FE) MG tablet Take 1 tablet (325 mg total) by mouth 2 (two) times daily with a meal. Patient not taking: Reported on 01/06/2017 12/27/16   Bonnielee Haff, MD  metroNIDAZOLE (FLAGYL) 500 MG tablet Take 1 tablet (500 mg total) by mouth every 8 (eight) hours. Patient not taking: Reported on 01/06/2017 12/29/16 01/08/17  Bonnielee Haff, MD  saccharomyces boulardii (FLORASTOR) 250 MG capsule Take 1 capsule (250 mg total) by mouth 2 (two) times daily. Patient not taking: Reported on 01/06/2017 12/29/16   Bonnielee Haff, MD    Physical Exam:   Constitutional: Morbidly obese female in some moderate discomfort. Vitals:   01/06/17 0048 01/06/17 0339 01/06/17 0346  BP:   114/60  Pulse: (!) 110  94  Resp: 16  18  Temp: 98.9 F (37.2 C)    TempSrc: Oral    SpO2: 97% 97% 98%  Weight: (!) 178.3 kg (393 lb)    Height: 5' 2"  (1.575 m)     Eyes: PERRL, lids and conjunctivae normal ENMT: Mucous membranes are moist. Posterior pharynx clear of any exudate or lesions.Normal  dentition.  Neck: normal, supple, no masses, no thyromegaly Respiratory: clear to auscultation bilaterally, no wheezing, no crackles. Normal respiratory effort. No accessory muscle use.  Cardiovascular: Regular rate and rhythm, no murmurs / rubs / gallops.  2+ pedal pulses. No carotid bruits.  Abdomen:Tenderness to palpation around colostomy site, no masses palpated. No hepatosplenomegaly. Bowel sounds positive.  Musculoskeletal: no clubbing / cyanosis. No joint deformity upper and lower extremities. Good ROM, no contractures. Normal muscle tone.  Skin: no rashes, lesions, ulcers. No induration Neurologic: CN 2-12 grossly intact. Sensation intact, DTR normal. Strength 5/5 in all 4.  Psychiatric: Normal judgment and insight. Alert and oriented x 3. Normal mood.     Labs on Admission: I have personally reviewed following labs and imaging studies  CBC:  Recent Labs Lab 01/06/17 0113  WBC 7.6  HGB 10.2*  HCT  34.7*  MCV 69.3*  PLT 643   Basic Metabolic Panel:  Recent Labs Lab 01/06/17 0113  NA 134*  K 4.2  CL 102  CO2 23  GLUCOSE 90  BUN 11  CREATININE 0.97  CALCIUM 8.9   GFR: Estimated Creatinine Clearance: 128.3 mL/min (by C-G formula based on SCr of 0.97 mg/dL). Liver Function Tests:  Recent Labs Lab 01/06/17 0113  AST 14*  ALT 9*  ALKPHOS 63  BILITOT 0.8  PROT 8.4*  ALBUMIN 3.5    Recent Labs Lab 01/06/17 0113  LIPASE 15   No results for input(s): AMMONIA in the last 168 hours. Coagulation Profile: No results for input(s): INR, PROTIME in the last 168 hours. Cardiac Enzymes: No results for input(s): CKTOTAL, CKMB, CKMBINDEX, TROPONINI in the last 168 hours. BNP (last 3 results) No results for input(s): PROBNP in the last 8760 hours. HbA1C: No results for input(s): HGBA1C in the last 72 hours. CBG: No results for input(s): GLUCAP in the last 168 hours. Lipid Profile: No results for input(s): CHOL, HDL, LDLCALC, TRIG, CHOLHDL, LDLDIRECT in the last 72 hours. Thyroid Function Tests: No results for input(s): TSH, T4TOTAL, FREET4, T3FREE, THYROIDAB in the last 72 hours. Anemia Panel: No results for input(s): VITAMINB12, FOLATE, FERRITIN, TIBC, IRON, RETICCTPCT in the last 72 hours. Urine analysis:    Component Value Date/Time   COLORURINE RED (A) 01/06/2017 0143   APPEARANCEUR HAZY (A) 01/06/2017 0143   LABSPEC 1.017 01/06/2017 0143   PHURINE 5.0 01/06/2017 0143   GLUCOSEU NEGATIVE 01/06/2017 0143   HGBUR LARGE (A) 01/06/2017 0143   BILIRUBINUR NEGATIVE 01/06/2017 0143   KETONESUR NEGATIVE 01/06/2017 0143   PROTEINUR 100 (A) 01/06/2017 0143   UROBILINOGEN 0.2 07/22/2015 0405   NITRITE NEGATIVE 01/06/2017 0143   LEUKOCYTESUR TRACE (A) 01/06/2017 0143   Sepsis Labs: No results found for this or any previous visit (from the past 240 hour(s)).   Radiological Exams on Admission: Ct Abdomen Pelvis W Contrast  Result Date: 01/06/2017 CLINICAL DATA:   Recurrent abdominal pain, history of diverticulitis and abscess EXAM: CT ABDOMEN AND PELVIS WITH CONTRAST TECHNIQUE: Multidetector CT imaging of the abdomen and pelvis was performed using the standard protocol following bolus administration of intravenous contrast. CONTRAST:  100 mL Isovue-300 intravenous COMPARISON:  12/26/2016, 12/25/2016 FINDINGS: Lower chest: Lung bases demonstrate no acute consolidation or pleural effusion. There is mild cardiomegaly. Hepatobiliary: No focal liver abnormality is seen. No gallstones, gallbladder wall thickening, or biliary dilatation. Pancreas: Unremarkable. No pancreatic ductal dilatation or surrounding inflammatory changes. Spleen: Normal in size without focal abnormality. Adrenals/Urinary Tract: Adrenal glands are unremarkable. Kidneys are normal, without renal calculi, focal  lesion, or hydronephrosis. Bladder is unremarkable. Stomach/Bowel: The stomach is nonenlarged. There is no dilated small bowel. Left abdominal colostomy. Significant inflammation of the distal transverse colon leading into the ostomy with considerable inflammation surrounding the colostomy and involving the left abdominal wall. Phlegmonous changes are present superior to the colostomy but without well-formed gas collection. A tiny gas bubble is seen in the region of inflammatory change, series 2, image number 37, uncertain if this represents a developing small abscess pocket. Vascular/Lymphatic: Non aneurysmal aorta. No grossly enlarged abdominal or pelvic lymph nodes. Reproductive: Uterus and bilateral adnexa are unremarkable. Other: No free air or free fluid. Ventral hernia containing mesenteric fat and transverse colon. Musculoskeletal: No acute or suspicious bone lesion. IMPRESSION: 1. Wall thickening/inflammation of the distal transverse colon just proximal to the left abdominal colostomy ; there is considerable inflammatory change surrounding the colostomy and involving the left anterior abdominal  wall. Phlegmonous changes noted slightly superior to the ostomy. There is a tiny gas bubble in the region of phlegmonous change superior and slightly posterior to the colostomy, which may reflect a tiny or developing abscess. 2. Ventral hernia containing mesentery and transverse colon. Electronically Signed   By: Donavan Foil M.D.   On: 01/06/2017 03:36      Assessment/Plan Diverticulitis with possible abscess: Unresolved.  - Admit to a MedSurg bed - Continue antibiotics of cefepime and metronidazole IV - pain control  Crohn's disease: Patient nothing by mouth on Remicade and is followed gastroenterology at [facility].  - May want to notify them in a.m.  Anemia hemoglobin noted to be 10.2 on admission. Improved from last hemoglobin noted approximately 1 week ago of 7.8.  - Would continue iron supplementation  OSA (obstructive sleep apnea): Patient reports not routinely using CPAP  Morbid obesity: Previous noted body mass index is 73.75 kg/m.  DVT prophylaxis: lovenox Code Status: full Family Communication: No family present at bedside Disposition Plan: likely discharge home in 1-2 days  Consults called: none  Admission status: observation  Norval Morton MD [facility] Pager [phone]  If 7PM-7AM, please contact night-coverage [email or web address] Password [facility]  01/06/2017, 4:57 AM

## 2017-01-06 NOTE — Consult Note (Signed)
Chief Complaint: Patient was seen in consultation today for abdominal pain  Referring Physician(s):  Dr. Michael Boston  Supervising Physician: Markus Daft  Patient Status: [facility] - In-pt  History of Present Illness: Dominique [name] is a 36 y.o. female with past medical history of Crohn's disease s/p hemicolectomy and colostomy in 2016 by Dr. Grandville Silos.  Patient was recently admitted 4/4-12/29/16 with acute diverticulitis of the transverse colon associated with an abscess. During hospitalization patient underwent percutaneous aspiration of a complex left anterior abdominal wall abscess yielding 1 mL of purulent material.  The collection was too small for drain placement.  Patient was discharged 12/29/16 with PO antibiotics.  Patient returns to the ED today with increasing abdominal pain.   CT Abd/Pelvis 01/06/17 shows: Wall thickening/inflammation of the distal transverse colon just proximal to the left abdominal colostomy ; there is considerable inflammatory change surrounding the colostomy and involving the left anterior abdominal wall. Phlegmonous changes noted slightly superior to the ostomy. There is a tiny gas bubble in the region of phlegmonous change superior and slightly posterior to the colostomy, which may reflect a tiny or developing abscess.  IR consulted for percutaneous aspiration and drainage at the request of Dr. Johney Maine.   Patient has been NPO.   Past Medical History:  Diagnosis Date  . Acute acalculous cholecystitis 11/16/2014  . Anemia   . Anxiety   . Asthma   . CHF (congestive heart failure) ([place])   . Complication of anesthesia    states she had a problem staying awake after her c-section, was transferred from [facility] to [facility]  . Coronary artery disease   . Diverticulitis of colon 11/17/2014  . Headache    used to have migraines  . Hepatic steatosis 11/17/2014  . Hypertension   . Morbid obesity ([place]) 03/22/2009   Qualifier: Diagnosis of  By: Ronnald Ramp CMA, [place]    .  Neuropathy   . OSA (obstructive sleep apnea) 05/02/2014  . Seasonal allergies 06/13/2014  . Sickle cell anemia ([place])    She states she " has the trait" (07/20/15)  . Sleep apnea    uses Bipap  . Thyromegaly 05/02/2014    Past Surgical History:  Procedure Laterality Date  . c section 2011  2011  . COLONOSCOPY WITH PROPOFOL N/A 12/27/2015   Procedure: COLONOSCOPY WITH PROPOFOL;  Surgeon: Wonda Horner, MD;  Location: [facility] ENDOSCOPY;  Service: Endoscopy;  Laterality: N/A;  . COLOSTOMY N/A 06/11/2015   Procedure:  CREATION OF COLOSTOMY;  Surgeon: Georganna Skeans, MD;  Location: [place];  Service: General;  Laterality: N/A;  . ESOPHAGOGASTRODUODENOSCOPY N/A 07/19/2015   Procedure: ESOPHAGOGASTRODUODENOSCOPY (EGD);  Surgeon: Clarene Essex, MD;  Location: [facility] ENDOSCOPY;  Service: Endoscopy;  Laterality: N/A;  . FLEXIBLE SIGMOIDOSCOPY N/A 06/05/2015   Procedure: FLEXIBLE SIGMOIDOSCOPY;  Surgeon: Ronald Lobo, MD;  Location: [facility] ENDOSCOPY;  Service: Endoscopy;  Laterality: N/A;  . LAPAROTOMY N/A 06/09/2015   Procedure: EXPLORATORY LAPAROTOMY, DRAINAGE OF INTRAABDOMINAL ABSCESS, PARTIAL COLON RESECTION,  APPLICATION OF WOUND VAC;  Surgeon: Georganna Skeans, MD;  Location: [place];  Service: General;  Laterality: N/A;  . LAPAROTOMY N/A 06/11/2015   Procedure: RE-EXPLORATION OF ABDOMEN;  Surgeon: Georganna Skeans, MD;  Location: [place];  Service: General;  Laterality: N/A;    Allergies: Other and Penicillins  Medications: Prior to Admission medications   Medication Sig Start Date End Date Taking? Authorizing Provider  acetaminophen (TYLENOL) 500 MG tablet Take 1,000 mg by mouth every 6 (six) hours as needed for mild pain.  Yes Historical Provider, MD  cefUROXime (CEFTIN) 500 MG tablet Take 1 tablet by mouth 2 (two) times daily. 12/30/16  Yes Historical Provider, MD  ibuprofen (ADVIL,MOTRIN) 200 MG tablet Take 600 mg by mouth every 4 (four) hours as needed for moderate pain.   Yes Historical Provider, MD  inFLIXimab  (REMICADE) 100 MG injection Inject 100 mg into the vein as directed.   Yes Historical Provider, MD  oxyCODONE (OXY IR/ROXICODONE) 5 MG immediate release tablet Take 1 tablet (5 mg total) by mouth every 4 (four) hours. 12/29/16  Yes Bonnielee Haff, MD  PROAIR HFA 108 (90 BASE) MCG/ACT inhaler INHALE 2 PUFFS BY MOUTH INTO THE LUNGS EVERY 6 HOURS AS NEEDED FOR WHEEZING OR SHORTNESS OF BREATH 02/17/15  Yes Midge Minium, MD  ciprofloxacin (CIPRO) 500 MG tablet Take 1 tablet (500 mg total) by mouth 2 (two) times daily. Patient not taking: Reported on 01/06/2017 12/29/16 01/08/17  Bonnielee Haff, MD  ferrous sulfate 325 (65 FE) MG tablet Take 1 tablet (325 mg total) by mouth 2 (two) times daily with a meal. Patient not taking: Reported on 01/06/2017 12/27/16   Bonnielee Haff, MD  metroNIDAZOLE (FLAGYL) 500 MG tablet Take 1 tablet (500 mg total) by mouth every 8 (eight) hours. Patient not taking: Reported on 01/06/2017 12/29/16 01/08/17  Bonnielee Haff, MD  saccharomyces boulardii (FLORASTOR) 250 MG capsule Take 1 capsule (250 mg total) by mouth 2 (two) times daily. Patient not taking: Reported on 01/06/2017 12/29/16   Bonnielee Haff, MD     Family History  Problem Relation Age of Onset  . Hypertension Mother   . Allergies Mother   . Hypertension Father   . Cancer Paternal Grandmother     lung  . Hypertension Sister   . Asthma Brother   . Diabetes Maternal Grandmother   . Hypertension Maternal Grandmother   . Hypertension Maternal Grandfather   . Emphysema Paternal Grandfather     Social History   Social History  . Marital status: Single    Spouse name: N/A  . Number of children: N/A  . Years of education: N/A   Social History Main Topics  . Smoking status: Former Smoker    Packs/day: 0.50    Years: 4.00    Types: Cigarettes    Start date: 07/24/2014    Quit date: 05/27/2015  . Smokeless tobacco: Never Used  . Alcohol use No  . Drug use: No  . Sexual activity: Not Asked   Other Topics  Concern  . None   Social History Narrative  . None     Review of Systems  Constitutional: Negative for fatigue and fever.  Respiratory: Negative for cough and shortness of breath.   Cardiovascular: Negative for chest pain.  Gastrointestinal: Positive for abdominal pain. Negative for nausea and vomiting.  Psychiatric/Behavioral: Negative for behavioral problems and confusion.    Vital Signs: BP (!) 100/54 (BP Location: Left Arm)   Pulse 89   Temp 98.5 F (36.9 C) (Oral)   Resp 18   Ht 5' 2"  (1.575 m)   Wt (!) 393 lb (178.3 kg)   LMP 12/10/2016 Comment: waver signed  SpO2 94%   BMI 71.88 kg/m   Physical Exam  Constitutional: She is oriented to person, place, and time. She appears well-developed.  Cardiovascular: Normal rate, regular rhythm and normal heart sounds.   Pulmonary/Chest: Effort normal and breath sounds normal. No respiratory distress.  Abdominal: There is tenderness (to palpation around ostomy site).  Neurological: She is  alert and oriented to person, place, and time.  Skin: Skin is warm and dry.  Psychiatric: She has a normal mood and affect. Her behavior is normal. Judgment and thought content normal.  Nursing note and vitals reviewed.  Imaging: Ct Abdomen Pelvis W Contrast  Result Date: 01/06/2017 CLINICAL DATA:  Recurrent abdominal pain, history of diverticulitis and abscess EXAM: CT ABDOMEN AND PELVIS WITH CONTRAST TECHNIQUE: Multidetector CT imaging of the abdomen and pelvis was performed using the standard protocol following bolus administration of intravenous contrast. CONTRAST:  100 mL Isovue-300 intravenous COMPARISON:  12/26/2016, 12/25/2016 FINDINGS: Lower chest: Lung bases demonstrate no acute consolidation or pleural effusion. There is mild cardiomegaly. Hepatobiliary: No focal liver abnormality is seen. No gallstones, gallbladder wall thickening, or biliary dilatation. Pancreas: Unremarkable. No pancreatic ductal dilatation or surrounding  inflammatory changes. Spleen: Normal in size without focal abnormality. Adrenals/Urinary Tract: Adrenal glands are unremarkable. Kidneys are normal, without renal calculi, focal lesion, or hydronephrosis. Bladder is unremarkable. Stomach/Bowel: The stomach is nonenlarged. There is no dilated small bowel. Left abdominal colostomy. Significant inflammation of the distal transverse colon leading into the ostomy with considerable inflammation surrounding the colostomy and involving the left abdominal wall. Phlegmonous changes are present superior to the colostomy but without well-formed gas collection. A tiny gas bubble is seen in the region of inflammatory change, series 2, image number 37, uncertain if this represents a developing small abscess pocket. Vascular/Lymphatic: Non aneurysmal aorta. No grossly enlarged abdominal or pelvic lymph nodes. Reproductive: Uterus and bilateral adnexa are unremarkable. Other: No free air or free fluid. Ventral hernia containing mesenteric fat and transverse colon. Musculoskeletal: No acute or suspicious bone lesion. IMPRESSION: 1. Wall thickening/inflammation of the distal transverse colon just proximal to the left abdominal colostomy ; there is considerable inflammatory change surrounding the colostomy and involving the left anterior abdominal wall. Phlegmonous changes noted slightly superior to the ostomy. There is a tiny gas bubble in the region of phlegmonous change superior and slightly posterior to the colostomy, which may reflect a tiny or developing abscess. 2. Ventral hernia containing mesentery and transverse colon. Electronically Signed   By: Donavan Foil M.D.   On: 01/06/2017 03:36   Ct Abdomen Pelvis W Contrast  Result Date: 12/25/2016 CLINICAL DATA:  Pain around colostomy site. History of diverticulitis. Sickle cell disease. EXAM: CT ABDOMEN AND PELVIS WITH CONTRAST TECHNIQUE: Multidetector CT imaging of the abdomen and pelvis was performed using the standard  protocol following bolus administration of intravenous contrast. CONTRAST:  197m ISOVUE-300 IOPAMIDOL (ISOVUE-300) INJECTION 61% COMPARISON:  08/08/2015 FINDINGS: Examination is limited due to morbid obesity. Lower chest: The lung bases are clear. Mild stable cardiac enlargement but no pericardial effusion. Hepatobiliary: No focal hepatic lesions or intrahepatic biliary dilatation. Gallbladder is grossly normal. No common bile duct dilatation. Pancreas: Grossly normal.  No findings for acute pancreatitis. Spleen: Within normal limits in size.  No focal lesions. Adrenals/Urinary Tract: The adrenal glands and kidneys are grossly normal. No renal lesions or hydronephrosis. Stomach/Bowel: The stomach, duodenum, small bowel and colon are grossly normal. The the patient does have a left lower quadrant colostomy and there appears to be significant inflammation in the fat around the colostomy site suggesting cellulitis. There is also an abscess located along the medial and superior aspect of the colostomy site. The distal transverse colon appears inflamed with wall thickening and submucosal edema. This is likely diverticulitis as it enters the colostomy site with a adjacent diverticular abscess. Knee sigmoid colon/ Hartmann's pouch appears normal. No  acute inflammatory changes. The ascending colon is grossly normal. No small bowel abnormality is identified. No obstructive findings. Vascular/Lymphatic: The aorta and branch vessels appear normal. The major venous structures are patent. The no mesenteric or retroperitoneal mass or adenopathy. Small scattered lymph nodes are stable. Reproductive: The uterus and ovaries are unremarkable. Other: No free air. No pelvic fluid collections. Stable anterior abdominal wall hernia containing fat in the transverse colon. No inguinal mass or hernia. No subcutaneous lesions Musculoskeletal: No significant bony findings. IMPRESSION: 1. Acute diverticulitis involving the distal transverse  colon near its entry point into the left lower quadrant colostomy. There is associated marked inflammation of the subcutaneous fat, cellulitis and a 3.5 cm diverticular abscess which is intraabdominal. 2. No other significant intra-abdominal findings. Electronically Signed   By: Marijo Sanes M.D.   On: 12/25/2016 07:56   Ct Image Guided Fluid Drain By Catheter  Result Date: 12/27/2016 INDICATION: 36 year old with presumed diverticulitis and concern for abscess collection along the left anterior abdominal wall. Patient was scheduled for CT-guided aspiration and possible drainage. EXAM: CT-GUIDED ASPIRATION OF THE LEFT ABDOMINAL FLUID COLLECTION MEDICATIONS: None ANESTHESIA/SEDATION: Fentanyl 75 mcg IV; Versed 2.0 mg IV Moderate Sedation Time:  20 minutes The patient was continuously monitored during the procedure by the interventional radiology nurse under my direct supervision. COMPLICATIONS: None immediate. PROCEDURE: Informed written consent was obtained from the patient after a thorough discussion of the procedural risks, benefits and alternatives. All questions were addressed. A timeout was performed prior to the initiation of the procedure. Patient has placed supine on the CT scanner. Images through the abdomen were obtained. The collection along the anterior left abdomen was targeted. The abdomen just medial to the ostomy was prepped with chlorhexidine and sterile field was created. Skin was anesthetized with 1% lidocaine. 18 gauge trocar needle was directed into the collection with CT guidance and approximately 1 ml of bloody purulent fluid was obtained. Attempted to advance an Amplatz wire into the collection but the wire would not advance very far. As a result, a 5 [place] Yueh catheter was advanced over the wire and attempted to aspirate more fluid. No significant fluid was aspirated in the collection. However, some blood was aspirated as the catheter was withdrawn. Manual compression was placed over  the puncture site. Bandage placed over the puncture site. FINDINGS: Morbidly obese patient with a left colostomy. Again noted is an irregular collection along the left anterior abdominal wall just medial to the ostomy. This collection contains a small amount of gas. However, the collection appears to be slightly smaller than the CT from 12/25/2016. Only a small amount of bloody fluid could be aspirated from this area. This collection was not adequate for drain placement. IMPRESSION: CT-guided aspiration of the complex collection along the left anterior abdominal wall. Fluid was sent for culture. Electronically Signed   By: Markus Daft M.D.   On: 12/27/2016 08:14    Labs:  CBC:  Recent Labs  12/26/16 0615 12/27/16 0524 12/28/16 0459 01/06/17 0113  WBC 6.9 5.3 5.7 7.6  HGB 7.4* 7.2* 7.8* 10.2*  HCT 25.7* 25.2* 27.3* 34.7*  PLT 417* 399 420* 368    COAGS: No results for input(s): INR, APTT in the last 8760 hours.  BMP:  Recent Labs  12/26/16 0447 12/27/16 0524 12/28/16 0459 01/06/17 0113  NA 135 137 135 134*  K 4.4 4.3 4.1 4.2  CL 105 107 104 102  CO2 23 24 25 23   GLUCOSE 85 85 107* 90  BUN 9 7 7 11   CALCIUM 8.3* 8.7* 8.8* 8.9  CREATININE 0.86 0.81 0.87 0.97  GFRNONAA >60 >60 >60 >60  GFRAA >60 >60 >60 >60    LIVER FUNCTION TESTS:  Recent Labs  12/25/16 0625 01/06/17 0113  BILITOT 0.4 0.8  AST 11* 14*  ALT 9* 9*  ALKPHOS 59 63  PROT 7.3 8.4*  ALBUMIN 2.8* 3.5    TUMOR MARKERS: No results for input(s): AFPTM, CEA, CA199, CHROMGRNA in the last 8760 hours.  Assessment and Plan: Diverticulitis of the transverse colon Patient with history of Crohn's colitis now with diverticulitis of the transverse colon.  Patient underwent aspiration of small fluid collection which grew E. Coli 12/27/16.  She is currently on IV cefepime and IV and PO flagyl.  IR consulted for possible percutaneous aspiration and drainage.  Case and imaging reviewed with Dr. Anselm Pancoast.  CT shows marked  inflammation of the bowel and ostomy tract, however the previous fluid and gas collection has greatly improved and is nearly resolved.  Given improvement in interval of previous small collection, this is not amenable to aspiration or drainage at the time.   Thank you for this interesting consult.  I greatly enjoyed meeting Dominique [name] and look forward to participating in their care.  A copy of this report was sent to the requesting provider on this date.  Electronically Signed: Docia Barrier 01/06/2017, 2:54 PM   I spent a total of 40 Minutes    in face to face in clinical consultation, greater than 50% of which was counseling/coordinating care for diverticulitis.

## 2017-01-06 NOTE — ED Provider Notes (Signed)
[facility] DEPT Provider Note   CSN: [id number] Arrival date & time: 01/06/17  0041 By signing my name below, I, Dyke Brackett, attest that this documentation has been prepared under the direction and in the presence of non-physician practitioner, Alecia Lemming PA-C. Electronically Signed: Dyke Brackett, Scribe. 01/06/2017. 1:59 AM.   History   Chief Complaint Chief Complaint  Patient presents with  . Abdominal Pain   HPI Dominique [name] is a 36 y.o. female with a history of CHF, CAD, diverticulitis, and Crohn's disease s/p hemicolectomy and colostomy who presents to the Emergency Department complaining of recurrent severe abdominal pain. Pt was seen for the same two weeks ago and was admitted for diverticulitis and abdominal abscess. Per pt, she has been doing well since being discharged until her medication was changed to Cefuroxime 4 days ago. She was discharged home on Cipro and Flagyl but abscess culture was resistant to ciprofloxacin. She is scheduled for GI f/u with possible colonoscopy next week, but states the pain has gotten too severe. Pt has taken Oxycodone with improvement however this is no longer helping. Pt denies any nausea, vomiting, diarrhea (colostomy throughput is normal without blood), rectal discharge, fevers, or urinary symptoms.   The history is provided by the patient. No language interpreter was used.   Past Medical History:  Diagnosis Date  . Acute acalculous cholecystitis 11/16/2014  . Anemia   . Anxiety   . Asthma   . CHF (congestive heart failure) ([place])   . Complication of anesthesia    states she had a problem staying awake after her c-section, was transferred from [facility] to [facility]  . Coronary artery disease   . Diverticulitis of colon 11/17/2014  . Headache    used to have migraines  . Hepatic steatosis 11/17/2014  . Hypertension   . Morbid obesity ([place]) 03/22/2009   Qualifier: Diagnosis of  By: Ronnald Ramp CMA, [place]    . Neuropathy   . OSA (obstructive  sleep apnea) 05/02/2014  . Seasonal allergies 06/13/2014  . Sickle cell anemia ([place])    She states she " has the trait" (07/20/15)  . Sleep apnea    uses Bipap  . Thyromegaly 05/02/2014    Patient Active Problem List   Diagnosis Date Noted  . Abdominal abscess 12/25/2016  . Chronic anemia 12/25/2016  . Hypokalemia 07/21/2015  . Postoperative intra-abdominal abscess ([place])   . Adjustment disorder with mixed anxiety and depressed mood   . Physical deconditioning 06/26/2015  . Severe left ventricular systolic dysfunction 62/22/9798  . Pulmonary hypertension ([place])   . Crohn's disease ([place])   . Perforated sigmoid colon ([place])   . Peritonitis ([place])   . Peritoneal abscess ([place])   . OSA on CPAP   . Obesity hypoventilation syndrome ([place])   . Pressure ulcer 06/16/2015  . Protein-calorie malnutrition, severe ([place]) 06/10/2015  . Gastritis 04/07/2015  . Iron deficiency anemia 04/07/2015  . Sinus tachycardia ([place]) 12/29/2014  . Lumbar back pain 12/06/2014  . Rectal bleeding 11/19/2014  . Diverticulitis of colon 11/17/2014  . Hepatic steatosis 11/17/2014  . Seasonal allergies 06/13/2014  . OSA (obstructive sleep apnea) 05/02/2014  . Thyromegaly 05/02/2014  . AMENORRHEA 02/07/2010  . Morbid obesity- BMI 67.5 03/22/2009    Past Surgical History:  Procedure Laterality Date  . c section 2011  2011  . COLONOSCOPY WITH PROPOFOL N/A 12/27/2015   Procedure: COLONOSCOPY WITH PROPOFOL;  Surgeon: Wonda Horner, MD;  Location: [facility] ENDOSCOPY;  Service: Endoscopy;  Laterality: N/A;  . COLOSTOMY N/A  06/11/2015   Procedure:  CREATION OF COLOSTOMY;  Surgeon: Georganna Skeans, MD;  Location: [facility];  Service: General;  Laterality: N/A;  . ESOPHAGOGASTRODUODENOSCOPY N/A 07/19/2015   Procedure: ESOPHAGOGASTRODUODENOSCOPY (EGD);  Surgeon: Clarene Essex, MD;  Location: [facility] ENDOSCOPY;  Service: Endoscopy;  Laterality: N/A;  . FLEXIBLE SIGMOIDOSCOPY N/A 06/05/2015   Procedure: FLEXIBLE SIGMOIDOSCOPY;  Surgeon: Ronald Lobo, MD;  Location: [facility] ENDOSCOPY;  Service: Endoscopy;  Laterality: N/A;  . LAPAROTOMY N/A 06/09/2015   Procedure: EXPLORATORY LAPAROTOMY, DRAINAGE OF INTRAABDOMINAL ABSCESS, PARTIAL COLON RESECTION,  APPLICATION OF WOUND VAC;  Surgeon: Georganna Skeans, MD;  Location: [place];  Service: General;  Laterality: N/A;  . LAPAROTOMY N/A 06/11/2015   Procedure: RE-EXPLORATION OF ABDOMEN;  Surgeon: Georganna Skeans, MD;  Location: [place];  Service: General;  Laterality: N/A;    OB History    No data available       Home Medications    Prior to Admission medications   Medication Sig Start Date End Date Taking? Authorizing Provider  acetaminophen (TYLENOL) 500 MG tablet Take 1,000 mg by mouth every 6 (six) hours as needed for mild pain.   Yes Historical Provider, MD  cefUROXime (CEFTIN) 500 MG tablet Take 1 tablet by mouth 2 (two) times daily. 12/30/16  Yes Historical Provider, MD  ibuprofen (ADVIL,MOTRIN) 200 MG tablet Take 600 mg by mouth every 4 (four) hours as needed for moderate pain.   Yes Historical Provider, MD  inFLIXimab (REMICADE) 100 MG injection Inject 100 mg into the vein as directed.   Yes Historical Provider, MD  oxyCODONE (OXY IR/ROXICODONE) 5 MG immediate release tablet Take 1 tablet (5 mg total) by mouth every 4 (four) hours. 12/29/16  Yes Bonnielee Haff, MD  PROAIR HFA 108 (90 BASE) MCG/ACT inhaler INHALE 2 PUFFS BY MOUTH INTO THE LUNGS EVERY 6 HOURS AS NEEDED FOR WHEEZING OR SHORTNESS OF BREATH 02/17/15  Yes Midge Minium, MD  ciprofloxacin (CIPRO) 500 MG tablet Take 1 tablet (500 mg total) by mouth 2 (two) times daily. Patient not taking: Reported on 01/06/2017 12/29/16 01/08/17  Bonnielee Haff, MD  ferrous sulfate 325 (65 FE) MG tablet Take 1 tablet (325 mg total) by mouth 2 (two) times daily with a meal. Patient not taking: Reported on 01/06/2017 12/27/16   Bonnielee Haff, MD  metroNIDAZOLE (FLAGYL) 500 MG tablet Take 1 tablet (500 mg total) by mouth every 8 (eight) hours. Patient not  taking: Reported on 01/06/2017 12/29/16 01/08/17  Bonnielee Haff, MD  saccharomyces boulardii (FLORASTOR) 250 MG capsule Take 1 capsule (250 mg total) by mouth 2 (two) times daily. Patient not taking: Reported on 01/06/2017 12/29/16   Bonnielee Haff, MD    Family History Family History  Problem Relation Age of Onset  . Hypertension Mother   . Allergies Mother   . Hypertension Father   . Cancer Paternal Grandmother     lung  . Hypertension Sister   . Asthma Brother   . Diabetes Maternal Grandmother   . Hypertension Maternal Grandmother   . Hypertension Maternal Grandfather   . Emphysema Paternal Grandfather     Social History Social History  Substance Use Topics  . Smoking status: Former Smoker    Packs/day: 0.50    Years: 4.00    Types: Cigarettes    Start date: 07/24/2014    Quit date: 05/27/2015  . Smokeless tobacco: Never Used  . Alcohol use No    Allergies   Other and Penicillins  Review of Systems Review of Systems  Constitutional: Negative for fever.  HENT: Negative for rhinorrhea and sore throat.   Eyes: Negative for redness.  Respiratory: Negative for cough.   Cardiovascular: Negative for chest pain.  Gastrointestinal: Positive for abdominal pain. Negative for anal bleeding, diarrhea, nausea and vomiting.  Genitourinary: Negative.  Negative for dysuria.  Musculoskeletal: Negative for myalgias.  Skin: Negative for rash.  Neurological: Negative for headaches.  All other systems reviewed and are negative.  Physical Exam Updated Vital Signs Pulse (!) 110   Temp 98.9 F (37.2 C) (Oral)   Resp 16   Ht 5' 2"  (1.575 m)   Wt (!) 393 lb (178.3 kg)   LMP 12/10/2016 Comment: negative beta HCG 12/25/16  SpO2 97%   BMI 71.88 kg/m   Physical Exam  Constitutional: She appears well-developed and well-nourished. No distress.  HENT:  Head: Normocephalic and atraumatic.  Eyes: Conjunctivae are normal.  Cardiovascular: Normal rate and regular rhythm.   No murmur  heard. Pulmonary/Chest: Effort normal. No respiratory distress. She has no wheezes. She has no rales.  Abdominal: Soft. She exhibits no distension and no mass. There is tenderness. There is no rebound and no guarding.  Patient with tenderness of the left-sided middle and lower abdominal tenderness and warmth of the abdominal wall.  Neurological: She is alert.  Skin: Skin is warm and dry.  Psychiatric: She has a normal mood and affect.  Nursing note and vitals reviewed.  ED Treatments / Results  DIAGNOSTIC STUDIES:  Oxygen Saturation is 97% on RA, normal by my interpretation.    COORDINATION OF CARE:  1:56 AM Will order Dilaudid, Zofran and IV fluids. Discussed treatment plan with pt at bedside and pt agreed to plan.   Labs (all labs ordered are listed, but only abnormal results are displayed) Labs Reviewed  COMPREHENSIVE METABOLIC PANEL - Abnormal; Notable for the following:       Result Value   Sodium 134 (*)    Total Protein 8.4 (*)    AST 14 (*)    ALT 9 (*)    All other components within normal limits  CBC - Abnormal; Notable for the following:    Hemoglobin 10.2 (*)    HCT 34.7 (*)    MCV 69.3 (*)    MCH 20.4 (*)    MCHC 29.4 (*)    RDW 21.3 (*)    All other components within normal limits  URINALYSIS, ROUTINE W REFLEX MICROSCOPIC - Abnormal; Notable for the following:    Color, Urine RED (*)    APPearance HAZY (*)    Hgb urine dipstick LARGE (*)    Protein, ur 100 (*)    Leukocytes, UA TRACE (*)    Bacteria, UA RARE (*)    All other components within normal limits  LIPASE, BLOOD    Radiology Ct Abdomen Pelvis W Contrast  Result Date: 01/06/2017 CLINICAL DATA:  Recurrent abdominal pain, history of diverticulitis and abscess EXAM: CT ABDOMEN AND PELVIS WITH CONTRAST TECHNIQUE: Multidetector CT imaging of the abdomen and pelvis was performed using the standard protocol following bolus administration of intravenous contrast. CONTRAST:  100 mL Isovue-300 intravenous  COMPARISON:  12/26/2016, 12/25/2016 FINDINGS: Lower chest: Lung bases demonstrate no acute consolidation or pleural effusion. There is mild cardiomegaly. Hepatobiliary: No focal liver abnormality is seen. No gallstones, gallbladder wall thickening, or biliary dilatation. Pancreas: Unremarkable. No pancreatic ductal dilatation or surrounding inflammatory changes. Spleen: Normal in size without focal abnormality. Adrenals/Urinary Tract: Adrenal glands are unremarkable. Kidneys are normal, without renal calculi,  focal lesion, or hydronephrosis. Bladder is unremarkable. Stomach/Bowel: The stomach is nonenlarged. There is no dilated small bowel. Left abdominal colostomy. Significant inflammation of the distal transverse colon leading into the ostomy with considerable inflammation surrounding the colostomy and involving the left abdominal wall. Phlegmonous changes are present superior to the colostomy but without well-formed gas collection. A tiny gas bubble is seen in the region of inflammatory change, series 2, image number 37, uncertain if this represents a developing small abscess pocket. Vascular/Lymphatic: Non aneurysmal aorta. No grossly enlarged abdominal or pelvic lymph nodes. Reproductive: Uterus and bilateral adnexa are unremarkable. Other: No free air or free fluid. Ventral hernia containing mesenteric fat and transverse colon. Musculoskeletal: No acute or suspicious bone lesion. IMPRESSION: 1. Wall thickening/inflammation of the distal transverse colon just proximal to the left abdominal colostomy ; there is considerable inflammatory change surrounding the colostomy and involving the left anterior abdominal wall. Phlegmonous changes noted slightly superior to the ostomy. There is a tiny gas bubble in the region of phlegmonous change superior and slightly posterior to the colostomy, which may reflect a tiny or developing abscess. 2. Ventral hernia containing mesentery and transverse colon. Electronically  Signed   By: Donavan Foil M.D.   On: 01/06/2017 03:36    Procedures Procedures (including critical care time)  Medications Ordered in ED Medications  ceFEPIme (MAXIPIME) 2 g in dextrose 5 % 50 mL IVPB (2 g Intravenous New Bag/Given 01/06/17 0436)    And  metroNIDAZOLE (FLAGYL) IVPB 500 mg (500 mg Intravenous New Bag/Given 01/06/17 0437)  HYDROmorphone (DILAUDID) injection 1 mg (1 mg Intravenous Given 01/06/17 0231)  ondansetron (ZOFRAN) injection 4 mg (4 mg Intravenous Given 01/06/17 0231)  sodium chloride 0.9 % bolus 1,000 mL (0 mLs Intravenous Stopped 01/06/17 0427)  iopamidol (ISOVUE-300) 61 % injection 100 mL (100 mLs Intravenous Contrast Given 01/06/17 0306)  HYDROmorphone (DILAUDID) injection 1 mg (1 mg Intravenous Given 01/06/17 0446)     Initial Impression / Assessment and Plan / ED Course  I have reviewed the triage vital signs and the nursing notes.  Pertinent labs & imaging results that were available during my care of the patient were reviewed by me and considered in my medical decision making (see chart for details).     Vital signs reviewed and are as follows: Vitals:   01/06/17 0048 01/06/17 0346  BP:  114/60  Pulse: (!) 110 94  Resp: 16 18  Temp: 98.9 F (37.2 C)    4:58 AM CT reviewed. IV abx ordered. Discussed with Dr. Kathrynn Humble. Patient updated on results. She continues to have pain.  Given clinical worsening despite what should be appropriate oral antibiotics, immunosuppresion, will re-broaden her treatment and admit for continued therapy.   Discussed with Dr. Tamala Julian who will see patient.   Final Clinical Impressions(s) / ED Diagnoses   Final diagnoses:  Intra-abdominal infection   Admit.   New Prescriptions New Prescriptions   No medications on file   I personally performed the services described in this documentation, which was scribed in my presence. The recorded information has been reviewed and is accurate.    Carlisle Cater, PA-C 01/06/17  [id number]    Varney Biles, MD 01/07/17 [phone]

## 2017-01-06 NOTE — ED Triage Notes (Signed)
States abdominal pain for one month supposed to have endoscopy next week but cant wait pain is getting worse.  No N/V/D no fever no dysuria.

## 2017-01-06 NOTE — Consult Note (Signed)
Reason for Consult:  Abdominal pain Referring Physician: Waldron Labs, D  CC:  Increasing abdominal pain since discharge.  Dominique [name] is an 36 y.o. female.  HPI: Dominique [name] is a 36 year old woman with a history of Crohn's disease. He is status post a hemicolectomy and colostomy in 2016 by Dr. Grandville Silos. She was hospitalized from 12/25/16 thru 12/29/16 with acute diverticulitis of the transverse colon associated with an abscess. She underwent IR aspiration of the fluid collection on 12/26/16. Abscess grew Escherichia coli resistant to Cipro. Her antibiotics were adjusted and she was discharged on 12/29/16.  She presented to the emergency room this a.m. with increasing pain. No nausea vomiting diarrhea no fever or dysuria. She is tolerating a soft diet without difficulty. Her ostomy is working normally. As noted above and no fever no night sweats or increasing symptoms of peritonitis. Her only complaint is increasing pain.   Workup in the ED here today shows she is afebrile. Vital signs are stable. CMP is unremarkable. WBC 7.6, hemoglobin 10.2, hematocrit 34.7. Platelets are 368,000. Urinalysis is unremarkable, except for some hematuria. CT scan this a.m. with contrast shows: Wall thickening/inflammation of the distal transverse colon proximal to the abdominal colostomy. There is considerable inflammatory changes around the colostomy and involving left anterior abdominal wall. Phlegmonous changes to the ostomy also noted. There is a tiny gas bubble in the region of the change superior posterior to the colostomy. This is concerning for a developing abscess. There is a ventral hernia containing mesentery and transverse colon. We are asked to see. Her antibiotics have been adjusted to cefepime and Flagyl. She is followed by Dr. Edwin Dada in [facility] - on Remicade.  Past Medical History:  Diagnosis Date  . Acute acalculous cholecystitis 11/16/2014  . Anemia   . Anxiety   . Asthma   . CHF (congestive heart failure) ([place])    . Complication of anesthesia    states she had a problem staying awake after her c-section, was [facility] from [facility]  . Coronary artery disease   . Diverticulitis of colon 11/17/2014  . Headache    used to have migraines  . Hepatic steatosis 11/17/2014  . Hypertension   . Morbid obesity ([place]) 03/22/2009   Qualifier: Diagnosis of  By: Ronnald Ramp CMA, [place]    . Neuropathy   . OSA (obstructive sleep apnea) 05/02/2014  . Seasonal allergies 06/13/2014  . Sickle cell anemia ([place])    She states she " has the trait" (07/20/15)  . Sleep apnea    uses Bipap  . Thyromegaly 05/02/2014    Past Surgical History:  Procedure Laterality Date  . c section 2011  2011  . COLONOSCOPY WITH PROPOFOL N/A 12/27/2015   Procedure: COLONOSCOPY WITH PROPOFOL;  Surgeon: Wonda Horner, MD;  Location: [facility] ENDOSCOPY;  Service: Endoscopy;  Laterality: N/A;  . COLOSTOMY N/A 06/11/2015   Procedure:  CREATION OF COLOSTOMY;  Surgeon: Georganna Skeans, MD;  Location: [place];  Service: General;  Laterality: N/A;  . ESOPHAGOGASTRODUODENOSCOPY N/A 07/19/2015   Procedure: ESOPHAGOGASTRODUODENOSCOPY (EGD);  Surgeon: Clarene Essex, MD;  Location: [facility] ENDOSCOPY;  Service: Endoscopy;  Laterality: N/A;  . FLEXIBLE SIGMOIDOSCOPY N/A 06/05/2015   Procedure: FLEXIBLE SIGMOIDOSCOPY;  Surgeon: Ronald Lobo, MD;  Location: [facility] ENDOSCOPY;  Service: Endoscopy;  Laterality: N/A;  . LAPAROTOMY N/A 06/09/2015   Procedure: EXPLORATORY LAPAROTOMY, DRAINAGE OF INTRAABDOMINAL ABSCESS, PARTIAL COLON RESECTION,  APPLICATION OF WOUND VAC;  Surgeon: Georganna Skeans, MD;  Location: [place];  Service: General;  Laterality:  N/A;  . LAPAROTOMY N/A 06/11/2015   Procedure: RE-EXPLORATION OF ABDOMEN;  Surgeon: Georganna Skeans, MD;  Location: [facility] OR;  Service: General;  Laterality: N/A;    Family History  Problem Relation Age of Onset  . Hypertension Mother   . Allergies Mother   . Hypertension Father   . Cancer Paternal Grandmother     lung  . Hypertension  Sister   . Asthma Brother   . Diabetes Maternal Grandmother   . Hypertension Maternal Grandmother   . Hypertension Maternal Grandfather   . Emphysema Paternal Grandfather     Social History:  reports that she quit smoking about 19 months ago. Her smoking use included Cigarettes. She started smoking about 2 years ago. She has a 2.00 pack-year smoking history. She has never used smokeless tobacco. She reports that she does not drink alcohol or use drugs.  Allergies:  Allergies  Allergen Reactions  . Other Shortness Of Breath and Swelling    Tree nuts  . Penicillins Other (See Comments)    Unknown childhood allergy    Prior to Admission medications   Medication Sig Start Date End Date Taking? Authorizing Provider  acetaminophen (TYLENOL) 500 MG tablet Take 1,000 mg by mouth every 6 (six) hours as needed for mild pain.   Yes Historical Provider, MD  cefUROXime (CEFTIN) 500 MG tablet Take 1 tablet by mouth 2 (two) times daily. 12/30/16  Yes Historical Provider, MD  ibuprofen (ADVIL,MOTRIN) 200 MG tablet Take 600 mg by mouth every 4 (four) hours as needed for moderate pain.   Yes Historical Provider, MD  inFLIXimab (REMICADE) 100 MG injection Inject 100 mg into the vein as directed.   Yes Historical Provider, MD  oxyCODONE (OXY IR/ROXICODONE) 5 MG immediate release tablet Take 1 tablet (5 mg total) by mouth every 4 (four) hours. 12/29/16  Yes Bonnielee Haff, MD  PROAIR HFA 108 (90 BASE) MCG/ACT inhaler INHALE 2 PUFFS BY MOUTH INTO THE LUNGS EVERY 6 HOURS AS NEEDED FOR WHEEZING OR SHORTNESS OF BREATH 02/17/15  Yes Midge Minium, MD  ciprofloxacin (CIPRO) 500 MG tablet Take 1 tablet (500 mg total) by mouth 2 (two) times daily. Patient not taking: Reported on 01/06/2017 12/29/16 01/08/17  Bonnielee Haff, MD  ferrous sulfate 325 (65 FE) MG tablet Take 1 tablet (325 mg total) by mouth 2 (two) times daily with a meal. Patient not taking: Reported on 01/06/2017 12/27/16   Bonnielee Haff, MD  metroNIDAZOLE  (FLAGYL) 500 MG tablet Take 1 tablet (500 mg total) by mouth every 8 (eight) hours. Patient not taking: Reported on 01/06/2017 12/29/16 01/08/17  Bonnielee Haff, MD  saccharomyces boulardii (FLORASTOR) 250 MG capsule Take 1 capsule (250 mg total) by mouth 2 (two) times daily. Patient not taking: Reported on 01/06/2017 12/29/16   Bonnielee Haff, MD     Results for orders placed or performed during the hospital encounter of 01/06/17 (from the past 48 hour(s))  Lipase, blood     Status: None   Collection Time: 01/06/17  1:13 AM  Result Value Ref Range   Lipase 15 11 - 51 U/L  Comprehensive metabolic panel     Status: Abnormal   Collection Time: 01/06/17  1:13 AM  Result Value Ref Range   Sodium 134 (L) 135 - 145 mmol/L   Potassium 4.2 3.5 - 5.1 mmol/L   Chloride 102 101 - 111 mmol/L   CO2 23 22 - 32 mmol/L   Glucose, Bld 90 65 - 99 mg/dL   BUN 11  6 - 20 mg/dL   Creatinine, Ser 0.97 0.44 - 1.00 mg/dL   Calcium 8.9 8.9 - 10.3 mg/dL   Total Protein 8.4 (H) 6.5 - 8.1 g/dL   Albumin 3.5 3.5 - 5.0 g/dL   AST 14 (L) 15 - 41 U/L   ALT 9 (L) 14 - 54 U/L   Alkaline Phosphatase 63 38 - 126 U/L   Total Bilirubin 0.8 0.3 - 1.2 mg/dL   GFR calc non Af Amer >60 >60 mL/min   GFR calc Af Amer >60 >60 mL/min    Comment: (NOTE) The eGFR has been calculated using the CKD EPI equation. This calculation has not been validated in all clinical situations. eGFR's persistently <60 mL/min signify possible Chronic Kidney Disease.    Anion gap 9 5 - 15  CBC     Status: Abnormal   Collection Time: 01/06/17  1:13 AM  Result Value Ref Range   WBC 7.6 4.0 - 10.5 K/uL   RBC 5.01 3.87 - 5.11 MIL/uL   Hemoglobin 10.2 (L) 12.0 - 15.0 g/dL   HCT 34.7 (L) 36.0 - 46.0 %   MCV 69.3 (L) 78.0 - 100.0 fL   MCH 20.4 (L) 26.0 - 34.0 pg   MCHC 29.4 (L) 30.0 - 36.0 g/dL   RDW 21.3 (H) 11.5 - 15.5 %   Platelets 368 150 - 400 K/uL  Urinalysis, Routine w reflex microscopic     Status: Abnormal   Collection Time: 01/06/17   1:43 AM  Result Value Ref Range   Color, Urine RED (A) YELLOW    Comment: BIOCHEMICALS MAY BE AFFECTED BY COLOR   APPearance HAZY (A) CLEAR   Specific Gravity, Urine 1.017 1.005 - 1.030   pH 5.0 5.0 - 8.0   Glucose, UA NEGATIVE NEGATIVE mg/dL   Hgb urine dipstick LARGE (A) NEGATIVE   Bilirubin Urine NEGATIVE NEGATIVE   Ketones, ur NEGATIVE NEGATIVE mg/dL   Protein, ur 100 (A) NEGATIVE mg/dL   Nitrite NEGATIVE NEGATIVE   Leukocytes, UA TRACE (A) NEGATIVE   RBC / HPF TOO NUMEROUS TO COUNT 0 - 5 RBC/hpf   WBC, UA 0-5 0 - 5 WBC/hpf   Bacteria, UA RARE (A) NONE SEEN   Squamous Epithelial / LPF NONE SEEN NONE SEEN   Mucous PRESENT    Hyaline Casts, UA PRESENT     Ct Abdomen Pelvis W Contrast  Result Date: 01/06/2017 CLINICAL DATA:  Recurrent abdominal pain, history of diverticulitis and abscess EXAM: CT ABDOMEN AND PELVIS WITH CONTRAST TECHNIQUE: Multidetector CT imaging of the abdomen and pelvis was performed using the standard protocol following bolus administration of intravenous contrast. CONTRAST:  100 mL Isovue-300 intravenous COMPARISON:  12/26/2016, 12/25/2016 FINDINGS: Lower chest: Lung bases demonstrate no acute consolidation or pleural effusion. There is mild cardiomegaly. Hepatobiliary: No focal liver abnormality is seen. No gallstones, gallbladder wall thickening, or biliary dilatation. Pancreas: Unremarkable. No pancreatic ductal dilatation or surrounding inflammatory changes. Spleen: Normal in size without focal abnormality. Adrenals/Urinary Tract: Adrenal glands are unremarkable. Kidneys are normal, without renal calculi, focal lesion, or hydronephrosis. Bladder is unremarkable. Stomach/Bowel: The stomach is nonenlarged. There is no dilated small bowel. Left abdominal colostomy. Significant inflammation of the distal transverse colon leading into the ostomy with considerable inflammation surrounding the colostomy and involving the left abdominal wall. Phlegmonous changes are present  superior to the colostomy but without well-formed gas collection. A tiny gas bubble is seen in the region of inflammatory change, series 2, image number 37, uncertain if this  represents a developing small abscess pocket. Vascular/Lymphatic: Non aneurysmal aorta. No grossly enlarged abdominal or pelvic lymph nodes. Reproductive: Uterus and bilateral adnexa are unremarkable. Other: No free air or free fluid. Ventral hernia containing mesenteric fat and transverse colon. Musculoskeletal: No acute or suspicious bone lesion. IMPRESSION: 1. Wall thickening/inflammation of the distal transverse colon just proximal to the left abdominal colostomy ; there is considerable inflammatory change surrounding the colostomy and involving the left anterior abdominal wall. Phlegmonous changes noted slightly superior to the ostomy. There is a tiny gas bubble in the region of phlegmonous change superior and slightly posterior to the colostomy, which may reflect a tiny or developing abscess. 2. Ventral hernia containing mesentery and transverse colon. Electronically Signed   By: Donavan Foil M.D.   On: 01/06/2017 03:36    Review of Systems  Constitutional: Negative.   HENT: Negative.   Eyes: Negative.   Respiratory: Negative.   Cardiovascular: Negative.   Gastrointestinal: Positive for abdominal pain. Negative for blood in stool, constipation, diarrhea (Ostomy is working normally.), heartburn, melena, nausea and vomiting.  Genitourinary: Negative.   Musculoskeletal: Negative.   Skin: Negative.   Neurological: Negative.   Endo/Heme/Allergies: Negative.   Psychiatric/Behavioral: Negative.    Blood pressure 129/74, pulse (!) 107, temperature 98.4 F (36.9 C), temperature source Oral, resp. rate 20, height 5' 2"  (1.575 m), weight (!) 178.3 kg (393 lb), last menstrual period 12/10/2016, SpO2 95 %. Physical Exam  Constitutional: She is oriented to person, place, and time. She appears well-developed and well-nourished. No  distress.  Body mass index is 71.88 kg/m. No distress comfortable in bed.  HENT:  Head: Normocephalic and atraumatic.  Eyes: Right eye exhibits no discharge. Left eye exhibits no discharge. No scleral icterus.  Pupils are equal  Neck: Normal range of motion. Neck supple. No JVD present. No tracheal deviation present. No thyromegaly present.  Cardiovascular: Normal rate, regular rhythm, normal heart sounds and intact distal pulses.   No murmur heard. Respiratory: Effort normal and breath sounds normal. No respiratory distress. She has no wheezes. She has no rales. She exhibits no tenderness.  GI: Soft. Bowel sounds are normal. She exhibits no distension and no mass. There is tenderness. There is no rebound and no guarding.  Morbidly obese abdomen. Midline surgical scar well-healed periumbilical hernia, soft, nontender. Ostomy is working well.  Musculoskeletal: She exhibits no edema.  Lymphadenopathy:    She has no cervical adenopathy.  Neurological: She is alert and oriented to person, place, and time. No cranial nerve deficit.  Skin: Skin is warm and dry. No rash noted. She is not diaphoretic. No erythema. No pallor.  Psychiatric: She has a normal mood and affect. Her behavior is normal. Judgment and thought content normal.    Assessment/Plan: Diverticulitis of the transverse colon hospitalized 4/4-12/29/16 Status post IR aspiration of a fluid collection growing Escherichia coli History of Crohn's disease followed by Dr. Edwin Dada [facility] - on Remicade History of sickle cell anemia Morbid obesity/BMI 71 Struct of sleep apnea  Plan: Agree with IV antibiotics I would keep her nothing by mouth on ice chips currently until her abdominal pain and inflammatory process has improved. Will review the CT scan and studies with Dr. Johney Maine. She is asking about transferred to [facility] and Dr. Drema Dallas service. Will defer to primary attending.   Davan Nawabi 01/06/2017, 12:20 PM

## 2017-01-06 NOTE — Progress Notes (Signed)
PROGRESS NOTE                                                                                                                                                                                                             Patient Demographics:    Dominique [name], is a 36 y.o. female, DOB - 04-11-1981, LAG:[id number]  Admit date - 01/06/2017   Admitting Physician Norval Morton, MD  Outpatient Primary MD for the patient is OSEI-BONSU,GEORGE, MD  LOS - 0  Outpatient Specialists: GI Dr Baird Lyons at [facility]  Patient presents with  . Abdominal Pain       Brief Narrative   Patient admitted earlier today by Dr. Fuller Plan, this is no charge note, chart and labs were reviewed.   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   Anemia  - hemoglobin noted to be 10.2 on admission. Improved from last hemoglobin noted approximately 1 week ago of 7.8.  - Would continue iron supplementation  OSA (obstructive sleep apnea): -  Patient reports not routinely using CPAP  Morbid obesity:  - Previous  noted body mass index is 73.75 kg/m.   Code Status : full  Family Communication  : None at bedside  Disposition Plan  : pending further work up.  Consults  :  General surgery  Procedures  : None  DVT Prophylaxis  :  Lovenox -  SCDs  Lab Results  Component Value Date  PLT 368 01/06/2017    Antibiotics  :   Anti-infectives    Start     Dose/Rate Route Frequency Ordered Stop   01/06/17 1600  ceFEPIme (MAXIPIME) 2 g in dextrose 5 % 50 mL IVPB  Status:  Discontinued     2 g 100 mL/hr over 30 Minutes Intravenous Every 8 hours 01/06/17 0641 01/06/17 0643   01/06/17 1400  metroNIDAZOLE (FLAGYL) IVPB 500 mg  Status:  Discontinued     500 mg 100 mL/hr over 60 Minutes Intravenous Every 8 hours 01/06/17 0614 01/06/17 0614   01/06/17 1400  metroNIDAZOLE (FLAGYL) IVPB 500 mg     500 mg 100 mL/hr over 60 Minutes Intravenous Every 8 hours 01/06/17 0614     01/06/17 1400  ceFEPIme (MAXIPIME) 2 g in dextrose 5 % 50 mL IVPB     2 g 100 mL/hr over 30 Minutes Intravenous Every 8 hours 01/06/17 0643     01/06/17 0430  ceFEPIme (MAXIPIME) 2 g in dextrose 5 % 50 mL IVPB     2 g 100 mL/hr over 30 Minutes Intravenous  Once 01/06/17 0427 01/06/17 0506   01/06/17 0430  metroNIDAZOLE (FLAGYL) IVPB 500 mg     500 mg 100 mL/hr over 60 Minutes Intravenous  Once 01/06/17 0427 01/06/17 0537        Objective:   Vitals:   01/06/17 0602 01/06/17 0620 01/06/17 0703 01/06/17 1412  BP: (!) 110/56  129/74 (!) 100/54  Pulse: 96  (!) 107 89  Resp: 18  20 18   Temp:   98.4 F (36.9 C) 98.5 F (36.9 C)  TempSrc:   Oral Oral  SpO2: 95% 95% 95% 94%  Weight:      Height:        Wt Readings from Last 3 Encounters:  01/06/17 (!) 178.3 kg (393 lb)  12/25/16 (!) 182.9 kg (403 lb 3.2 oz)  12/27/15 (!) 149.7 kg (330 lb)     Intake/Output Summary (Last 24 hours) at 01/06/17 1506 Last data filed at 01/06/17 0427  Gross per 24 hour  Intake             1000 ml  Output                0 ml  Net              1000 ml     Physical Exam  Awake Alert, Oriented X 3, affect Supple Neck,No JVD,  Symmetrical Chest wall movement, Good air movement bilaterally, CTAB RRR,No Gallops,Rubs or new Murmurs, No Parasternal Heave +ve B.Sounds, Abd Soft, No rebound - guarding or rigidity,colostomy +, with tenderness medially to colostomy. No Cyanosis, Clubbing or edema, No new Rash or bruise     Data Review:    CBC  Recent Labs Lab 01/06/17 0113  WBC 7.6  HGB 10.2*  HCT 34.7*  PLT 368  MCV 69.3*  MCH 20.4*  MCHC 29.4*  RDW 21.3*    Chemistries   Recent Labs Lab 01/06/17 0113  NA 134*  K 4.2  CL 102  CO2 23  GLUCOSE 90  BUN 11  CREATININE 0.97  CALCIUM 8.9  AST 14*  ALT 9*  ALKPHOS 63  BILITOT 0.8   ------------------------------------------------------------------------------------------------------------------ No results for input(s): CHOL, HDL, LDLCALC, TRIG, CHOLHDL, LDLDIRECT in the last 72 hours.  Lab Results  Component Value Date   HGBA1C 5.2 04/07/2015   ------------------------------------------------------------------------------------------------------------------ No results for input(s): TSH, T4TOTAL, T3FREE, THYROIDAB in the last 72  hours.  Invalid input(s): FREET3 ------------------------------------------------------------------------------------------------------------------ No results for input(s): VITAMINB12, FOLATE, FERRITIN, TIBC, IRON, RETICCTPCT in the last 72 hours.  Coagulation profile No results for input(s): INR, PROTIME in the last 168 hours.  No results for input(s): DDIMER in the last 72 hours.  Cardiac Enzymes No results for input(s): CKMB, TROPONINI, MYOGLOBIN in the last 168 hours.  Invalid input(s): CK ------------------------------------------------------------------------------------------------------------------ No results found for: BNP  Inpatient Medications  Scheduled Meds: . ceFEPime (MAXIPIME) IV  2 g Intravenous  Q8H  . enoxaparin (LOVENOX) injection  80 mg Subcutaneous Q24H  .  HYDROmorphone (DILAUDID) injection  1 mg Intravenous Once  . metronidazole  500 mg Intravenous Q8H   Continuous Infusions: . sodium chloride 100 mL/hr at 01/06/17 0853   PRN Meds:.acetaminophen **OR** acetaminophen, albuterol, morphine injection, ondansetron **OR** ondansetron (ZOFRAN) IV, oxyCODONE  Micro Results No results found for this or any previous visit (from the past 240 hour(s)).  Radiology Reports Ct Abdomen Pelvis W Contrast  Result Date: 01/06/2017 CLINICAL DATA:  Recurrent abdominal pain, history of diverticulitis and abscess EXAM: CT ABDOMEN AND PELVIS WITH CONTRAST TECHNIQUE: Multidetector CT imaging of the abdomen and pelvis was performed using the standard protocol following bolus administration of intravenous contrast. CONTRAST:  100 mL Isovue-300 intravenous COMPARISON:  12/26/2016, 12/25/2016 FINDINGS: Lower chest: Lung bases demonstrate no acute consolidation or pleural effusion. There is mild cardiomegaly. Hepatobiliary: No focal liver abnormality is seen. No gallstones, gallbladder wall thickening, or biliary dilatation. Pancreas: Unremarkable. No pancreatic ductal dilatation or surrounding inflammatory changes. Spleen: Normal in size without focal abnormality. Adrenals/Urinary Tract: Adrenal glands are unremarkable. Kidneys are normal, without renal calculi, focal lesion, or hydronephrosis. Bladder is unremarkable. Stomach/Bowel: The stomach is nonenlarged. There is no dilated small bowel. Left abdominal colostomy. Significant inflammation of the distal transverse colon leading into the ostomy with considerable inflammation surrounding the colostomy and involving the left abdominal wall. Phlegmonous changes are present superior to the colostomy but without well-formed gas collection. A tiny gas bubble is seen in the region of inflammatory change, series 2, image number 37, uncertain if this represents a  developing small abscess pocket. Vascular/Lymphatic: Non aneurysmal aorta. No grossly enlarged abdominal or pelvic lymph nodes. Reproductive: Uterus and bilateral adnexa are unremarkable. Other: No free air or free fluid. Ventral hernia containing mesenteric fat and transverse colon. Musculoskeletal: No acute or suspicious bone lesion. IMPRESSION: 1. Wall thickening/inflammation of the distal transverse colon just proximal to the left abdominal colostomy ; there is considerable inflammatory change surrounding the colostomy and involving the left anterior abdominal wall. Phlegmonous changes noted slightly superior to the ostomy. There is a tiny gas bubble in the region of phlegmonous change superior and slightly posterior to the colostomy, which may reflect a tiny or developing abscess. 2. Ventral hernia containing mesentery and transverse colon. Electronically Signed   By: Donavan Foil M.D.   On: 01/06/2017 03:36   Ct Abdomen Pelvis W Contrast  Result Date: 12/25/2016 CLINICAL DATA:  Pain around colostomy site. History of diverticulitis. Sickle cell disease. EXAM: CT ABDOMEN AND PELVIS WITH CONTRAST TECHNIQUE: Multidetector CT imaging of the abdomen and pelvis was performed using the standard protocol following bolus administration of intravenous contrast. CONTRAST:  146m ISOVUE-300 IOPAMIDOL (ISOVUE-300) INJECTION 61% COMPARISON:  08/08/2015 FINDINGS: Examination is limited due to morbid obesity. Lower chest: The lung bases are clear. Mild stable cardiac enlargement but no pericardial effusion. Hepatobiliary: No focal hepatic lesions or intrahepatic biliary dilatation. Gallbladder is grossly normal. No common bile duct dilatation. Pancreas:  Grossly normal.  No findings for acute pancreatitis. Spleen: Within normal limits in size.  No focal lesions. Adrenals/Urinary Tract: The adrenal glands and kidneys are grossly normal. No renal lesions or hydronephrosis. Stomach/Bowel: The stomach, duodenum, small bowel and  colon are grossly normal. The the patient does have a left lower quadrant colostomy and there appears to be significant inflammation in the fat around the colostomy site suggesting cellulitis. There is also an abscess located along the medial and superior aspect of the colostomy site. The distal transverse colon appears inflamed with wall thickening and submucosal edema. This is likely diverticulitis as it enters the colostomy site with a adjacent diverticular abscess. Knee sigmoid colon/ Hartmann's pouch appears normal. No acute inflammatory changes. The ascending colon is grossly normal. No small bowel abnormality is identified. No obstructive findings. Vascular/Lymphatic: The aorta and branch vessels appear normal. The major venous structures are patent. The no mesenteric or retroperitoneal mass or adenopathy. Small scattered lymph nodes are stable. Reproductive: The uterus and ovaries are unremarkable. Other: No free air. No pelvic fluid collections. Stable anterior abdominal wall hernia containing fat in the transverse colon. No inguinal mass or hernia. No subcutaneous lesions Musculoskeletal: No significant bony findings. IMPRESSION: 1. Acute diverticulitis involving the distal transverse colon near its entry point into the left lower quadrant colostomy. There is associated marked inflammation of the subcutaneous fat, cellulitis and a 3.5 cm diverticular abscess which is intraabdominal. 2. No other significant intra-abdominal findings. Electronically Signed   By: Marijo Sanes M.D.   On: 12/25/2016 07:56   Ct Image Guided Fluid Drain By Catheter  Result Date: 12/27/2016 INDICATION: 36 year old with presumed diverticulitis and concern for abscess collection along the left anterior abdominal wall. Patient was scheduled for CT-guided aspiration and possible drainage. EXAM: CT-GUIDED ASPIRATION OF THE LEFT ABDOMINAL FLUID COLLECTION MEDICATIONS: None ANESTHESIA/SEDATION: Fentanyl 75 mcg IV; Versed 2.0 mg IV  Moderate Sedation Time:  20 minutes The patient was continuously monitored during the procedure by the interventional radiology nurse under my direct supervision. COMPLICATIONS: None immediate. PROCEDURE: Informed written consent was obtained from the patient after a thorough discussion of the procedural risks, benefits and alternatives. All questions were addressed. A timeout was performed prior to the initiation of the procedure. Patient has placed supine on the CT scanner. Images through the abdomen were obtained. The collection along the anterior left abdomen was targeted. The abdomen just medial to the ostomy was prepped with chlorhexidine and sterile field was created. Skin was anesthetized with 1% lidocaine. 18 gauge trocar needle was directed into the collection with CT guidance and approximately 1 ml of bloody purulent fluid was obtained. Attempted to advance an Amplatz wire into the collection but the wire would not advance very far. As a result, a 5 [place] Yueh catheter was advanced over the wire and attempted to aspirate more fluid. No significant fluid was aspirated in the collection. However, some blood was aspirated as the catheter was withdrawn. Manual compression was placed over the puncture site. Bandage placed over the puncture site. FINDINGS: Morbidly obese patient with a left colostomy. Again noted is an irregular collection along the left anterior abdominal wall just medial to the ostomy. This collection contains a small amount of gas. However, the collection appears to be slightly smaller than the CT from 12/25/2016. Only a small amount of bloody fluid could be aspirated from this area. This collection was not adequate for drain placement. IMPRESSION: CT-guided aspiration of the complex collection along the left anterior abdominal wall.  Fluid was sent for culture. Electronically Signed   By: Markus Daft M.D.   On: 12/27/2016 08:14      Jace Dowe M.D on 01/06/2017 at 3:06  PM  Between 7am to 7pm - Pager - [phone]  After 7pm go to [email or web address] - password [facility]  [facility] -  Office  [phone]

## 2017-01-06 NOTE — Discharge Summary (Signed)
Dominique [name], is a 36 y.o. female  DOB 29-Aug-1981  MRN [id number].  Admission date:  01/06/2017  Admitting Physician  Norval Morton, MD  Discharge Date:  01/06/2017   Primary MD  Benito Mccreedy, MD  Recommendations for primary care physician for things to follow:  - Further workup per  primary gastroenterology team.   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 Acute acalculous cholecystitis 11/16/2014  . Anemia   . Anxiety   . Asthma   . CHF (congestive heart failure) ([place])   . Complication of anesthesia    states she had a problem staying awake after her c-section, was [facility] from [facility] to [facility]  . Coronary artery disease   . Diverticulitis of colon 11/17/2014  . Headache    used to have migraines  . Hepatic steatosis 11/17/2014  . Hypertension   . Morbid obesity ([place]) 03/22/2009   Qualifier: Diagnosis of  By: Ronnald Ramp CMA, [place]    . Neuropathy   . OSA (obstructive sleep apnea) 05/02/2014  . Seasonal allergies 06/13/2014  . Sickle cell anemia ([place])    She states she " has the trait" (07/20/15)  . Sleep apnea    uses Bipap  . Thyromegaly 05/02/2014    Past Surgical History:  Procedure Laterality Date  . c section 2011  2011    . COLONOSCOPY WITH PROPOFOL N/A 12/27/2015   Procedure: COLONOSCOPY WITH PROPOFOL;  Surgeon: Wonda Horner, MD;  Location: [facility] ENDOSCOPY;  Service: Endoscopy;  Laterality: N/A;  . COLOSTOMY N/A 06/11/2015   Procedure:  CREATION OF COLOSTOMY;  Surgeon: Georganna Skeans, MD;  Location: [place];  Service: General;  Laterality: N/A;  . ESOPHAGOGASTRODUODENOSCOPY N/A 07/19/2015   Procedure: ESOPHAGOGASTRODUODENOSCOPY (EGD);  Surgeon: Clarene Essex, MD;  Location: [facility] ENDOSCOPY;  Service: Endoscopy;  Laterality: N/A;  . FLEXIBLE SIGMOIDOSCOPY N/A 06/05/2015   Procedure: FLEXIBLE SIGMOIDOSCOPY;  Surgeon: Ronald Lobo, MD;  Location: [facility] ENDOSCOPY;  Service: Endoscopy;  Laterality: N/A;  . LAPAROTOMY N/A 06/09/2015   Procedure: EXPLORATORY LAPAROTOMY, DRAINAGE OF INTRAABDOMINAL ABSCESS, PARTIAL COLON RESECTION,  APPLICATION OF WOUND VAC;  Surgeon: Georganna Skeans, MD;  Location: [place];  Service: General;  Laterality: N/A;  . LAPAROTOMY N/A 06/11/2015   Procedure: RE-EXPLORATION OF ABDOMEN;  Surgeon: Georganna Skeans, MD;  Location: [place];  Service: General;  Laterality: N/A;       History of present  illness and  Hospital Course:     Kindly see H&P for history of present illness and admission details, please review complete Labs, Consult reports and Test reports for all details in brief  HPI  from the history and physical done on the day of admission 01/06/2017 HPI: Dominique [name] is a 36 y.o. female with medical history significant of Crohn's disease on Remicade s/p hemicolectomy and colostomy in 2016 due to perforation of the sigmoid colon, HTN, morbid obesity, OSA not using CPAP; who presents with abdominal pain. Patient was just recently hospitalized from 4/4 through 4/8 at [facility] for abdominal pain after being found to have a 3.5 cm abscess and signs of diverticulitis in the transverse colon. Patient was initially treated with Cipro and Flagyl. She underwent interventional radiology drainage of abscess and  cultures were sent. She was discharged home initially on ciprofloxacin and Flagyl, but abscess grew cultures grew Escherichia coli resistant to ciprofloxacin. Patient was called the following day after her dischargeand asked to stop ciprofloxacin and Flagyl and start cefuroxime.  Patient reports picking up the new medication on 4/10 and reports taking it up until 2 days ago. Patient reports recurrence of the abdominal pain near her colostomy site that was sharp and progressively worsening. pain seemed to be worsened with deep breaths inspiratory breaths and movement. Denies any change from ostomy output, nausea, vomiting, fever, or chills. She tried using home oxycodone without relief of symptoms.   ED Course: On admission into the emergency department patient was seen to be afebrile with heart rates 94-110, and all other vitals are relatively within normal limits. Repeat CT scan of the abdomen revealed acute inflammatory changes along the distal transverse colon and tiny gas bubble that could represent a be developing abscess. Patient was started on IV antibiotics of cefepime and metronidazole in ED.    Hospital Course   Diverticulitis of the transverse colon - She required hospitalization from 4/4-4/8, for which she had pretty content is aspiration of the abscess, growing Escherichia coli, it was not significant for drain placement, fluid collection growing Escherichia coli, a shunt was prescribed crfuroxime but she only received one tablets before coming to the hospital, comes with worsening abdominal pain, repeat CT abdomen pelvis with evidence of considerable inflammation surrounding the colostomy and involving the left anterior abdominal wall, with phlegmonous changes, with findings which may reflect tiny or developing abscess posterior to the colostomy, Gen. surgery has been consulted, as well IR has been consulted for possible percutaneous aspiration and drainage, this is not amendable to aspiration  and drainage at this time given significant improvement of previous abscess. - Reviewed records on care everywhere, they did recommend the patient to go to [facility] for Triad care, but due to transportation issues she could not go to [facility], so she came to [facility] per [facility] Recommendation for Triag care/stabilization venous transferred [facility] from [facility]  Crohn's disease:  - Patient nothing by mouth on Remicade and is followed gastroenterology at [facility]   Anemia  - hemoglobin noted to be 10.2 on admission. Improved from last hemoglobin noted approximately 1 week ago of 7.8.  - Would continue iron supplementation  OSA (obstructive sleep apnea): -  Patient reports not routinely using CPAP  Morbid obesity:  - Previous noted body mass index is 73.75 kg/m.    Discharge Condition:  Stable      Discharge Instructions  and  Discharge Medications    Discharge Instructions  Increase activity slowly    Complete by:  As directed      Allergies as of 01/06/2017      Reactions   Other Shortness Of Breath, Swelling   Tree nuts   Penicillins Other (See Comments)   Unknown childhood allergy      Medication List    STOP taking these medications   acetaminophen 500 MG tablet Commonly known as:  TYLENOL   cefUROXime 500 MG tablet Commonly known as:  CEFTIN   ciprofloxacin 500 MG tablet Commonly known as:  CIPRO   ferrous sulfate 325 (65 FE) MG tablet   ibuprofen 200 MG tablet Commonly known as:  ADVIL,MOTRIN   metroNIDAZOLE 500 MG tablet Commonly known as:  FLAGYL   oxyCODONE 5 MG immediate release tablet Commonly known as:  Oxy IR/ROXICODONE   PROAIR HFA 108 (90 Base) MCG/ACT inhaler Generic drug:  albuterol   REMICADE 100 MG injection Generic drug:  inFLIXimab   saccharomyces boulardii 250 MG capsule Commonly known as:  FLORASTOR     TAKE these medications   ceFEPIme 2 g in dextrose 5 % 50 mL Inject 2 g into the vein every 8 (eight)  hours.   enoxaparin 80 MG/0.8ML injection Commonly known as:  LOVENOX Inject 0.8 mLs (80 mg total) into the skin daily.   metroNIDAZOLE 5-0.79 MG/ML-% IVPB Commonly known as:  FLAGYL Inject 100 mLs (500 mg total) into the vein every 8 (eight) hours.   morphine 4 MG/ML injection Inject 0.5-1 mLs (2-4 mg total) into the vein every 4 (four) hours as needed for severe pain.   ondansetron 4 MG/2ML Soln injection Commonly known as:  ZOFRAN Inject 2 mLs (4 mg total) into the vein every 6 (six) hours as needed for nausea.         Diet and Activity recommendation: See Discharge Instructions above   Consults obtained -  surgery   Major procedures and Radiology Reports - PLEASE review detailed and final reports for all details, in brief -      Ct Abdomen Pelvis W Contrast  Result Date: 01/06/2017 CLINICAL DATA:  Recurrent abdominal pain, history of diverticulitis and abscess EXAM: CT ABDOMEN AND PELVIS WITH CONTRAST TECHNIQUE: Multidetector CT imaging of the abdomen and pelvis was performed using the standard protocol following bolus administration of intravenous contrast. CONTRAST:  100 mL Isovue-300 intravenous COMPARISON:  12/26/2016, 12/25/2016 FINDINGS: Lower chest: Lung bases demonstrate no acute consolidation or pleural effusion. There is mild cardiomegaly. Hepatobiliary: No focal liver abnormality is seen. No gallstones, gallbladder wall thickening, or biliary dilatation. Pancreas: Unremarkable. No pancreatic ductal dilatation or surrounding inflammatory changes. Spleen: Normal in size without focal abnormality. Adrenals/Urinary Tract: Adrenal glands are unremarkable. Kidneys are normal, without renal calculi, focal lesion, or hydronephrosis. Bladder is unremarkable. Stomach/Bowel: The stomach is nonenlarged. There is no dilated small bowel. Left abdominal colostomy. Significant inflammation of the distal transverse colon leading into the ostomy with considerable inflammation  surrounding the colostomy and involving the left abdominal wall. Phlegmonous changes are present superior to the colostomy but without well-formed gas collection. A tiny gas bubble is seen in the region of inflammatory change, series 2, image number 37, uncertain if this represents a developing small abscess pocket. Vascular/Lymphatic: Non aneurysmal aorta. No grossly enlarged abdominal or pelvic lymph nodes. Reproductive: Uterus and bilateral adnexa are unremarkable. Other: No free air or free fluid. Ventral hernia containing mesenteric fat and transverse colon. Musculoskeletal: No acute or suspicious bone lesion. IMPRESSION: 1. Wall thickening/inflammation of the distal  transverse colon just proximal to the left abdominal colostomy ; there is considerable inflammatory change surrounding the colostomy and involving the left anterior abdominal wall. Phlegmonous changes noted slightly superior to the ostomy. There is a tiny gas bubble in the region of phlegmonous change superior and slightly posterior to the colostomy, which may reflect a tiny or developing abscess. 2. Ventral hernia containing mesentery and transverse colon. Electronically Signed   By: Donavan Foil M.D.   On: 01/06/2017 03:36   Ct Abdomen Pelvis W Contrast  Result Date: 12/25/2016 CLINICAL DATA:  Pain around colostomy site. History of diverticulitis. Sickle cell disease. EXAM: CT ABDOMEN AND PELVIS WITH CONTRAST TECHNIQUE: Multidetector CT imaging of the abdomen and pelvis was performed using the standard protocol following bolus administration of intravenous contrast. CONTRAST:  157m ISOVUE-300 IOPAMIDOL (ISOVUE-300) INJECTION 61% COMPARISON:  08/08/2015 FINDINGS: Examination is limited due to morbid obesity. Lower chest: The lung bases are clear. Mild stable cardiac enlargement but no pericardial effusion. Hepatobiliary: No focal hepatic lesions or intrahepatic biliary dilatation. Gallbladder is grossly normal. No common bile duct dilatation.  Pancreas: Grossly normal.  No findings for acute pancreatitis. Spleen: Within normal limits in size.  No focal lesions. Adrenals/Urinary Tract: The adrenal glands and kidneys are grossly normal. No renal lesions or hydronephrosis. Stomach/Bowel: The stomach, duodenum, small bowel and colon are grossly normal. The the patient does have a left lower quadrant colostomy and there appears to be significant inflammation in the fat around the colostomy site suggesting cellulitis. There is also an abscess located along the medial and superior aspect of the colostomy site. The distal transverse colon appears inflamed with wall thickening and submucosal edema. This is likely diverticulitis as it enters the colostomy site with a adjacent diverticular abscess. Knee sigmoid colon/ Hartmann's pouch appears normal. No acute inflammatory changes. The ascending colon is grossly normal. No small bowel abnormality is identified. No obstructive findings. Vascular/Lymphatic: The aorta and branch vessels appear normal. The major venous structures are patent. The no mesenteric or retroperitoneal mass or adenopathy. Small scattered lymph nodes are stable. Reproductive: The uterus and ovaries are unremarkable. Other: No free air. No pelvic fluid collections. Stable anterior abdominal wall hernia containing fat in the transverse colon. No inguinal mass or hernia. No subcutaneous lesions Musculoskeletal: No significant bony findings. IMPRESSION: 1. Acute diverticulitis involving the distal transverse colon near its entry point into the left lower quadrant colostomy. There is associated marked inflammation of the subcutaneous fat, cellulitis and a 3.5 cm diverticular abscess which is intraabdominal. 2. No other significant intra-abdominal findings. Electronically Signed   By: PMarijo SanesM.D.   On: 12/25/2016 07:56   Ct Image Guided Fluid Drain By Catheter  Result Date: 12/27/2016 INDICATION: 36year old with presumed diverticulitis and  concern for abscess collection along the left anterior abdominal wall. Patient was scheduled for CT-guided aspiration and possible drainage. EXAM: CT-GUIDED ASPIRATION OF THE LEFT ABDOMINAL FLUID COLLECTION MEDICATIONS: None ANESTHESIA/SEDATION: Fentanyl 75 mcg IV; Versed 2.0 mg IV Moderate Sedation Time:  20 minutes The patient was continuously monitored during the procedure by the interventional radiology nurse under my direct supervision. COMPLICATIONS: None immediate. PROCEDURE: Informed written consent was obtained from the patient after a thorough discussion of the procedural risks, benefits and alternatives. All questions were addressed. A timeout was performed prior to the initiation of the procedure. Patient has placed supine on the CT scanner. Images through the abdomen were obtained. The collection along the anterior left abdomen was targeted. The abdomen just medial to  the ostomy was prepped with chlorhexidine and sterile field was created. Skin was anesthetized with 1% lidocaine. 18 gauge trocar needle was directed into the collection with CT guidance and approximately 1 ml of bloody purulent fluid was obtained. Attempted to advance an Amplatz wire into the collection but the wire would not advance very far. As a result, a 5 [place] Yueh catheter was advanced over the wire and attempted to aspirate more fluid. No significant fluid was aspirated in the collection. However, some blood was aspirated as the catheter was withdrawn. Manual compression was placed over the puncture site. Bandage placed over the puncture site. FINDINGS: Morbidly obese patient with a left colostomy. Again noted is an irregular collection along the left anterior abdominal wall just medial to the ostomy. This collection contains a small amount of gas. However, the collection appears to be slightly smaller than the CT from 12/25/2016. Only a small amount of bloody fluid could be aspirated from this area. This collection was not  adequate for drain placement. IMPRESSION: CT-guided aspiration of the complex collection along the left anterior abdominal wall. Fluid was sent for culture. Electronically Signed   By: Markus Daft M.D.   On: 12/27/2016 08:14    Micro Results  No results found for this or any previous visit (from the past 240 hour(s)).     Today   Subjective:   Dominique [name] today  No headache, No chest pain, reports  abdominal pain - No Nausea, No new weakness tingling or numbness, No Cough - SOB  Objective:   Blood pressure 138/71, pulse 93, temperature 98.3 F (36.8 C), temperature source Oral, resp. rate 18, height 5' 2"  (1.575 m), weight (!) 178.3 kg (393 lb), last menstrual period 12/10/2016, SpO2 97 %.   Intake/Output Summary (Last 24 hours) at 01/06/17 1759 Last data filed at 01/06/17 1500  Gross per 24 hour  Intake          1761.67 ml  Output                0 ml  Net          1761.67 ml    Exam Awake Alert, Oriented X 3, affect Supple Neck,No JVD,  Symmetrical Chest wall movement, Good air movement bilaterally, CTAB RRR,No Gallops,Rubs or new Murmurs, No Parasternal Heave +ve B.Sounds, Abd Soft, No rebound - guarding or rigidity,colostomy +, with tenderness medially to colostomy. No Cyanosis, Clubbing or edema, No new Rash or bruise   Data Review   CBC w Diff: Lab Results  Component Value Date   WBC 7.6 01/06/2017   HGB 10.2 (L) 01/06/2017   HCT 34.7 (L) 01/06/2017   PLT 368 01/06/2017   LYMPHOPCT 23 12/25/2016   MONOPCT 10 12/25/2016   EOSPCT 3 12/25/2016   BASOPCT 0 12/25/2016    CMP: Lab Results  Component Value Date   NA 134 (L) 01/06/2017   K 4.2 01/06/2017   CL 102 01/06/2017   CO2 23 01/06/2017   BUN 11 01/06/2017   CREATININE 0.97 01/06/2017   CREATININE 0.52 12/29/2014   PROT 8.4 (H) 01/06/2017   ALBUMIN 3.5 01/06/2017   BILITOT 0.8 01/06/2017   ALKPHOS 63 01/06/2017   AST 14 (L) 01/06/2017   ALT 9 (L) 01/06/2017  .   Total Time in preparing paper  work, data evaluation and todays exam - 35 minutes  Dominique [name] M.D on 01/06/2017 at [facility]   Office  [phone]

## 2017-01-07 NOTE — Progress Notes (Signed)
Pt transferred to [facility] hospital via care link.  Report called to [facility] RN on 6 bed [employer] [phone].  Ciarra Braddy RN

## 2017-12-12 ENCOUNTER — Other Ambulatory Visit: Payer: Self-pay

## 2017-12-12 ENCOUNTER — Emergency Department (HOSPITAL_COMMUNITY)
Admission: EM | Admit: 2017-12-12 | Discharge: 2017-12-12 | Disposition: A | Discharge status: Home / Self Care | Payer: Medicaid Other | Attending: Emergency Medicine | Admitting: Emergency Medicine

## 2017-12-12 ENCOUNTER — Encounter (HOSPITAL_COMMUNITY): Payer: Self-pay

## 2017-12-12 DIAGNOSIS — I110 Hypertensive heart disease with heart failure: Secondary | ICD-10-CM | POA: Insufficient documentation

## 2017-12-12 DIAGNOSIS — F17210 Nicotine dependence, cigarettes, uncomplicated: Secondary | ICD-10-CM | POA: Diagnosis not present

## 2017-12-12 DIAGNOSIS — Z7901 Long term (current) use of anticoagulants: Secondary | ICD-10-CM | POA: Insufficient documentation

## 2017-12-12 DIAGNOSIS — K602 Anal fissure, unspecified: Secondary | ICD-10-CM | POA: Diagnosis not present

## 2017-12-12 DIAGNOSIS — R234 Changes in skin texture: Secondary | ICD-10-CM

## 2017-12-12 DIAGNOSIS — K6289 Other specified diseases of anus and rectum: Secondary | ICD-10-CM | POA: Diagnosis present

## 2017-12-12 DIAGNOSIS — I509 Heart failure, unspecified: Secondary | ICD-10-CM | POA: Diagnosis not present

## 2017-12-12 MED ORDER — BACITRACIN ZINC 500 UNIT/GM EX OINT: 1.0000 "application " | TOPICAL_OINTMENT | Freq: Two times a day (BID) | CUTANEOUS | 0 refills | Status: DC

## 2017-12-12 MED ORDER — OXYCODONE-ACETAMINOPHEN 5-325 MG PO TABS
2.0000 | ORAL_TABLET | Freq: Once | ORAL | Status: AC
Start: 2017-12-12 — End: 2017-12-12
  Administered 2017-12-12: 2 via ORAL
  Filled 2017-12-12: qty 2

## 2017-12-12 MED ORDER — VALACYCLOVIR HCL 1 G PO TABS: 1000.0000 mg | ORAL_TABLET | Freq: Three times a day (TID) | ORAL | 0 refills | Status: DC

## 2017-12-12 NOTE — Discharge Instructions (Signed)
Your evaluated in the emergency department for some pain between your buttocks.  It looks as if there is a skin fissure there.  We are prescribing some topical antibiotics and also giving her some barrier cream.  He also asked for a refill on the medications for the herpes.  You should use your medications as prescribed and follow-up with your doctor.  You can try some sitz baths to help soothe the area.

## 2017-12-12 NOTE — ED Provider Notes (Signed)
[facility] DEPT Provider Note   CSN: [id number] Arrival date & time: 12/12/17  0907     History   Chief Complaint Chief Complaint  Patient presents with  . Rectal Pain    HPI Dominique [name] is a 37 y.o. female.  She has a history of Crohn's and has a colostomy.  Her colon has been removed but she still has a rectum.  She is complaining about a month of pain in her gluteal cleft.  It feels like sandpaper and is very painful.  It is worse with movement.  She states about a week ago she started with some sores in her perineal area and went to her PCP.  There she was told she has herpes and she was treated with valacyclovir 1 daily for 7 days.  She states that has not improved at all and did not also help with her gluteal pain.  She tried Preparation H to the affected area with worsening of the pain.  There is been a little thin pink drainage from the skin.  There is been no fever no abdominal pain no other illness symptoms.  The history is provided by the patient.    Past Medical History:  Diagnosis Date  . Acute acalculous cholecystitis 11/16/2014  . Anemia   . Anxiety   . Asthma   . CHF (congestive heart failure) ([place])   . Complication of anesthesia    states she had a problem staying awake after her c-section, was transferred from [facility] to [facility]  . Coronary artery disease   . Diverticulitis of colon 11/17/2014  . Headache    used to have migraines  . Hepatic steatosis 11/17/2014  . Hypertension   . Morbid obesity ([place]) 03/22/2009   Qualifier: Diagnosis of  By: Ronnald Ramp CMA, Chemira    . Neuropathy   . OSA (obstructive sleep apnea) 05/02/2014  . Seasonal allergies 06/13/2014  . Sickle cell anemia ([place])    She states she " has the trait" (07/20/15)  . Sleep apnea    uses Bipap  . Thyromegaly 05/02/2014    Patient Active Problem List   Diagnosis Date Noted  . 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 Colostomy in place [facility])  01/06/2017  . 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 Infliximab (Remicade) long-term use 01/06/2017  . Crohn's colitis, with abscess ([place]) 01/06/2017  . Abdominal abscess 12/25/2016  . Chronic anemia 12/25/2016  . Hypokalemia 07/21/2015  . Adjustment disorder with mixed anxiety and depressed mood   . Physical deconditioning 06/26/2015  . Severe left ventricular systolic dysfunction 85/63/1497  . Pulmonary hypertension ([place])   . Crohn's colitis with perforation s/p left colectomy/colostomy 2016   . OSA on CPAP   . Obesity hypoventilation syndrome ([place])   . Pressure ulcer 06/16/2015  . Protein-calorie malnutrition, severe ([place]) 06/10/2015  . Gastritis 04/07/2015  . Iron deficiency anemia 04/07/2015  . Sinus tachycardia ([place]) 12/29/2014  . Lumbar back pain 12/06/2014  . Rectal bleeding 11/19/2014  . Diverticulitis of colon 11/17/2014  . Hepatic steatosis 11/17/2014  . Seasonal allergies 06/13/2014  . Thyromegaly 05/02/2014  . AMENORRHEA 02/07/2010  . Morbid obesity- BMI 67.5 03/22/2009  . Sickle cell trait ([place]) 10/20/2007    Past Surgical History:  Procedure Laterality Date  . c section 2011  2011  . COLONOSCOPY WITH PROPOFOL N/A 12/27/2015   Procedure: COLONOSCOPY WITH PROPOFOL;  Surgeon: Wonda Horner, MD;  Location: [facility] ENDOSCOPY;  Service: Endoscopy;  Laterality:  N/A;  . COLOSTOMY N/A 06/11/2015   Procedure:  CREATION OF COLOSTOMY;  Surgeon: Georganna Skeans, MD;  Location: [place];  Service: General;  Laterality: N/A;  . ESOPHAGOGASTRODUODENOSCOPY N/A 07/19/2015   Procedure: ESOPHAGOGASTRODUODENOSCOPY (EGD);  Surgeon: Clarene Essex, MD;  Location: [facility] ENDOSCOPY;  Service: Endoscopy;  Laterality: N/A;  . FLEXIBLE SIGMOIDOSCOPY N/A 06/05/2015   Procedure: FLEXIBLE SIGMOIDOSCOPY;  Surgeon: Ronald Lobo, MD;  Location: [facility] ENDOSCOPY;  Service: Endoscopy;  Laterality: N/A;  . LAPAROTOMY N/A 06/09/2015   Procedure: EXPLORATORY LAPAROTOMY, DRAINAGE OF INTRAABDOMINAL  ABSCESS, PARTIAL COLON RESECTION,  APPLICATION OF WOUND VAC;  Surgeon: Georganna Skeans, MD;  Location: [place];  Service: General;  Laterality: N/A;  . LAPAROTOMY N/A 06/11/2015   Procedure: RE-EXPLORATION OF ABDOMEN;  Surgeon: Georganna Skeans, MD;  Location: [place];  Service: General;  Laterality: N/A;    OB History   None      Home Medications    Prior to Admission medications   Medication Sig Start Date End Date Taking? Authorizing Provider  ceFEPIme 2 g in dextrose 5 % 50 mL Inject 2 g into the vein every 8 (eight) hours. 01/06/17   Elgergawy, Silver Huguenin, MD  enoxaparin (LOVENOX) 80 MG/0.8ML injection Inject 0.8 mLs (80 mg total) into the skin daily. 01/06/17   Elgergawy, Silver Huguenin, MD  metroNIDAZOLE (FLAGYL) 5-0.79 MG/ML-% IVPB Inject 100 mLs (500 mg total) into the vein every 8 (eight) hours. 01/06/17   Elgergawy, Silver Huguenin, MD  morphine 4 MG/ML injection Inject 0.5-1 mLs (2-4 mg total) into the vein every 4 (four) hours as needed for severe pain. 01/06/17   Elgergawy, Silver Huguenin, MD  ondansetron (ZOFRAN) 4 MG/2ML SOLN injection Inject 2 mLs (4 mg total) into the vein every 6 (six) hours as needed for nausea. 01/06/17   Elgergawy, Silver Huguenin, MD    Family History Family History  Problem Relation Age of Onset  . Hypertension Mother   . Allergies Mother   . Hypertension Father   . Cancer Paternal Grandmother        lung  . Hypertension Sister   . Asthma Brother   . Diabetes Maternal Grandmother   . Hypertension Maternal Grandmother   . Hypertension Maternal Grandfather   . Emphysema Paternal Grandfather     Social History Social History   Tobacco Use  . Smoking status: Current Some Day Smoker    Packs/day: 0.50    Years: 4.00    Pack years: 2.00    Types: Cigarettes    Start date: 07/24/2014    Last attempt to quit: 05/27/2015    Years since quitting: 2.5  . Smokeless tobacco: Never Used  Substance Use Topics  . Alcohol use: No    Alcohol/week: 0.0 oz  . Drug use: No      Allergies   Other and Penicillins   Review of Systems Review of Systems  Constitutional: Negative for fever.  HENT: Negative for sore throat.   Respiratory: Negative for shortness of breath.   Cardiovascular: Negative for chest pain.  Gastrointestinal: Negative for abdominal pain, blood in stool, nausea and vomiting.  Genitourinary: Negative for dysuria, frequency, hematuria, vaginal bleeding and vaginal discharge.  Skin: Negative for rash.     Physical Exam Updated Vital Signs BP 140/86 (BP Location: Left Arm)   Pulse 98   Temp 98.2 F (36.8 C) (Oral)   Resp (!) 22   Ht 5' 2"  (1.575 m)   Wt (!) 169.6 kg (374 lb)   LMP 11/21/2017 (  Approximate)   SpO2 94%   BMI 68.41 kg/m   Physical Exam  Constitutional: She appears well-developed and well-nourished.  HENT:  Head: Normocephalic and atraumatic.  Eyes: Conjunctivae are normal.  Neck: Neck supple.  Cardiovascular: Normal rate and regular rhythm.  Pulmonary/Chest: Effort normal and breath sounds normal.  Abdominal: Soft. Bowel sounds are normal. She exhibits no mass. There is no tenderness.  Genitourinary: Rectum normal.  Genitourinary Comments: About 4 cm superior to the rectum there is a fissure in the gluteal cleft that is tender and weeping some serous sanguinous fluid.  There is no fluctuance.  This reproduces the patient's pain on palpation.  Neurological: She is alert. GCS eye subscore is 4. GCS verbal subscore is 5. GCS motor subscore is 6.  Skin: Skin is warm and dry. Capillary refill takes less than 2 seconds.  Psychiatric: She has a normal mood and affect.     ED Treatments / Results  Labs (all labs ordered are listed, but only abnormal results are displayed) Labs Reviewed - No data to display  EKG  EKG Interpretation None       Radiology No results found.  Procedures Procedures (including critical care time)  Medications Ordered in ED Medications  oxyCODONE-acetaminophen  (PERCOCET/ROXICET) 5-325 MG per tablet 2 tablet (has no administration in time range)     Initial Impression / Assessment and Plan / ED Course  I have reviewed the triage vital signs and the nursing notes.  Pertinent labs & imaging results that were available during my care of the patient were reviewed by me and considered in my medical decision making (see chart for details).      Final Clinical Impressions(s) / ED Diagnoses   Final diagnoses:  Fissure in skin    ED Discharge Orders        Ordered    bacitracin ointment  2 times daily     12/12/17 1233    valACYclovir (VALTREX) 1000 MG tablet  3 times daily     12/12/17 1233       Hayden Rasmussen, MD 12/12/17 [phone]

## 2017-12-12 NOTE — ED Triage Notes (Signed)
Patient c/o of pain from the top of her buttocks to the rectum x 1-2 months and gradually getting worse. Patient states she has had blood-tinged drainage.

## 2018-02-05 ENCOUNTER — Emergency Department (HOSPITAL_COMMUNITY)
Admission: EM | Admit: 2018-02-05 | Discharge: 2018-02-05 | Disposition: A | Discharge status: Home / Self Care | Payer: Medicaid Other | Attending: Emergency Medicine | Admitting: Emergency Medicine

## 2018-02-05 ENCOUNTER — Encounter (HOSPITAL_COMMUNITY): Payer: Self-pay | Admitting: Nurse Practitioner

## 2018-02-05 ENCOUNTER — Other Ambulatory Visit: Payer: Self-pay

## 2018-02-05 DIAGNOSIS — I509 Heart failure, unspecified: Secondary | ICD-10-CM | POA: Diagnosis not present

## 2018-02-05 DIAGNOSIS — L731 Pseudofolliculitis barbae: Secondary | ICD-10-CM

## 2018-02-05 DIAGNOSIS — I110 Hypertensive heart disease with heart failure: Secondary | ICD-10-CM | POA: Insufficient documentation

## 2018-02-05 DIAGNOSIS — G44219 Episodic tension-type headache, not intractable: Secondary | ICD-10-CM | POA: Diagnosis not present

## 2018-02-05 DIAGNOSIS — F17210 Nicotine dependence, cigarettes, uncomplicated: Secondary | ICD-10-CM | POA: Diagnosis not present

## 2018-02-05 DIAGNOSIS — R234 Changes in skin texture: Secondary | ICD-10-CM | POA: Diagnosis not present

## 2018-02-05 DIAGNOSIS — R51 Headache: Secondary | ICD-10-CM | POA: Diagnosis present

## 2018-02-05 HISTORY — DX: Anal fistula: K60.3

## 2018-02-05 HISTORY — DX: Herpesviral infection, unspecified: B00.9

## 2018-02-05 HISTORY — DX: Anal fistula, unspecified: K60.30

## 2018-02-05 MED ORDER — ZINC OXIDE 40 % EX OINT
TOPICAL_OINTMENT | Freq: Once | CUTANEOUS | Status: AC | PRN
  Administered 2018-02-05: 1 via TOPICAL
  Filled 2018-02-05: qty 57

## 2018-02-05 MED ORDER — LIDOCAINE 5 % EX OINT
TOPICAL_OINTMENT | Freq: Once | CUTANEOUS | Status: AC
Start: 2018-02-05 — End: 2018-02-05
  Administered 2018-02-05: 1 via TOPICAL
  Filled 2018-02-05: qty 35.44

## 2018-02-05 MED ORDER — LIDOCAINE 5 % EX OINT: 1.0000 "application " | TOPICAL_OINTMENT | Freq: Four times a day (QID) | CUTANEOUS | 0 refills | Status: DC | PRN

## 2018-02-05 MED ORDER — LIDOCAINE HCL URETHRAL/MUCOSAL 2 % EX GEL
1.0000 "application " | Freq: Once | CUTANEOUS | Status: DC
  Filled 2018-02-05: qty 5

## 2018-02-05 NOTE — ED Triage Notes (Signed)
Patient has been having headaches for about 2 weeks now. The headaches are all frontal and it is more pressure behind her eyes. She states it is making her eyes red also. Patient also has been having rectal pain and pressure. 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

## 2018-02-05 NOTE — ED Provider Notes (Addendum)
[facility] DEPT Provider Note  CSN: [id number] Arrival date & time: 02/05/18 0732  Chief Complaint(s) No chief complaint on file.  HPI Dominique [name] is a 37 y.o. female extensive past medical history listed below including obesity, Crohn's disease status post colectomy with an ostomy bag, anal fissures presents to the emergency department with 2 chief complaints.  Patient's first complaint is a recurrent frontal headache, typical for her previous headaches.  This is been ongoing for approximately 3 to 4 weeks intermittently.  Currently reports that the headache is mild.  She denies any associated vision changes, fevers, nausea, vomiting, focal deficits.  Patient reports that she thought it was a sinus headache and was taken over-the-counter medication without relief.  Patient's second chief complaint is a persistent anal fissure that is not healing.  She reports that this is been ongoing for approximately 2 months.  She has been seen here in the emergency department and was evaluated by her primary care for it.  Patient states that she has not been doing any sitz baths as this hurts the area.  She has been trying to keep the area dry.  Has been applying bacitracin.  Patient was also prescribed acyclovir for possible HSV and antibiotics for possible bacterial infection which has not helped.  Patient denies any constipation.  No change in ostomy output.  Pain associated with the seizures is exacerbated with retraction of the skin, palpation, exposure to water.  No alleviating factors.  Patient denies any other physical complaints.  HPI  Past Medical History Past Medical History:  Diagnosis Date  . Acute acalculous cholecystitis 11/16/2014  . Anal fistula   . Anemia   . Anxiety   . Asthma   . CHF (congestive heart failure) ([place])   . Complication of anesthesia    states she had a problem staying awake after her c-section, was transferred from [facility] to [facility]  .  Coronary artery disease   . Diverticulitis of colon 11/17/2014  . Headache    used to have migraines  . Hepatic steatosis 11/17/2014  . Herpes   . Hypertension   . Morbid obesity ([name]) 03/22/2009   Qualifier: Diagnosis of  By: Ronnald Ramp CMA, Chemira    . Neuropathy   . OSA (obstructive sleep apnea) 05/02/2014  . Seasonal allergies 06/13/2014  . Sickle cell anemia ([place])    She states she " has the trait" (07/20/15)  . Sleep apnea    uses Bipap  . Thyromegaly 05/02/2014   Patient Active Problem List   Diagnosis Date Noted  . 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 Colostomy in place [facility]) 01/06/2017  . 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 Infliximab (Remicade) long-term use 01/06/2017  . Crohn's colitis, with abscess ([place]) 01/06/2017  . Abdominal abscess 12/25/2016  . Chronic anemia 12/25/2016  . Hypokalemia 07/21/2015  . Adjustment disorder with mixed anxiety and depressed mood   . Physical deconditioning 06/26/2015  . Severe left ventricular systolic dysfunction 01/75/1025  . Pulmonary hypertension ([place])   . Crohn's colitis with perforation s/p left colectomy/colostomy 2016   . OSA on CPAP   . Obesity hypoventilation syndrome ([place])   . Pressure ulcer 06/16/2015  . Protein-calorie malnutrition, severe ([place]) 06/10/2015  . Gastritis 04/07/2015  . Iron deficiency anemia 04/07/2015  . Sinus tachycardia ([place]) 12/29/2014  . Lumbar back pain 12/06/2014  . Rectal bleeding 11/19/2014  . Diverticulitis of colon 11/17/2014  . Hepatic steatosis 11/17/2014  . Seasonal allergies 06/13/2014  .  Thyromegaly 05/02/2014  . AMENORRHEA 02/07/2010  . Morbid obesity- BMI 67.5 03/22/2009  . Sickle cell trait ([place]) 10/20/2007   Home Medication(s) Prior to Admission medications   Medication Sig Start Date End Date Taking? Authorizing Provider  acetaminophen (TYLENOL) 500 MG tablet Take 1,000 mg by mouth every 6 (six) hours as needed for mild pain, moderate  pain or headache.   Yes [provider]  albuterol (PROVENTIL) (2.5 MG/3ML) 0.083% nebulizer solution Take 3 mLs by nebulization every 6 (six) hours as needed for wheezing or shortness of breath.  12/07/17  Yes [provider]  bacitracin ointment Apply 1 application topically 2 (two) times daily. Patient taking differently: Apply 1 application topically every 6 (six) hours as needed for wound care.  12/12/17  Yes Hayden Rasmussen, MD  ibuprofen (ADVIL,MOTRIN) 200 MG tablet Take 1,200 mg by mouth every 8 (eight) hours as needed for headache, mild pain or moderate pain.    Yes [provider]  PROAIR HFA 108 (90 Base) MCG/ACT inhaler Inhale 2 puffs into the lungs 4 (four) times daily as needed for wheezing or shortness of breath.  12/07/17  Yes [provider]  tiZANidine (ZANAFLEX) 4 MG tablet Take 4 mg by mouth 2 (two) times daily as needed for muscle spasms.  12/03/17  Yes [provider]  lidocaine (XYLOCAINE) 5 % ointment Apply 1 application topically 4 (four) times daily as needed. 02/05/18   Fatima Blank, MD  valACYclovir (VALTREX) 1000 MG tablet Take 1 tablet (1,000 mg total) by mouth 3 (three) times daily. Patient not taking: Reported on 02/05/2018 12/12/17   Hayden Rasmussen, MD                                                                                                                                    Past Surgical History Past Surgical History:  Procedure Laterality Date  . c section 2011  2011  . COLONOSCOPY WITH PROPOFOL N/A 12/27/2015   Procedure: COLONOSCOPY WITH PROPOFOL;  Surgeon: Wonda Horner, MD;  Location: [facility] ENDOSCOPY;  Service: Endoscopy;  Laterality: N/A;  . COLOSTOMY N/A 06/11/2015   Procedure:  CREATION OF COLOSTOMY;  Surgeon: Georganna Skeans, MD;  Location: [place];  Service: General;  Laterality: N/A;  . ESOPHAGOGASTRODUODENOSCOPY N/A 07/19/2015   Procedure: ESOPHAGOGASTRODUODENOSCOPY (EGD);  Surgeon: Clarene Essex, MD;   Location: [facility] ENDOSCOPY;  Service: Endoscopy;  Laterality: N/A;  . FLEXIBLE SIGMOIDOSCOPY N/A 06/05/2015   Procedure: FLEXIBLE SIGMOIDOSCOPY;  Surgeon: Ronald Lobo, MD;  Location: [facility] ENDOSCOPY;  Service: Endoscopy;  Laterality: N/A;  . LAPAROTOMY N/A 06/09/2015   Procedure: EXPLORATORY LAPAROTOMY, DRAINAGE OF INTRAABDOMINAL ABSCESS, PARTIAL COLON RESECTION,  APPLICATION OF WOUND VAC;  Surgeon: Georganna Skeans, MD;  Location: [place];  Service: General;  Laterality: N/A;  . LAPAROTOMY N/A 06/11/2015   Procedure: RE-EXPLORATION OF ABDOMEN;  Surgeon: Georganna Skeans, MD;  Location: [place];  Service: General;  Laterality:  N/A;   Family History Family History  Problem Relation Age of Onset  . Hypertension Mother   . Allergies Mother   . Hypertension Father   . Cancer Paternal Grandmother        lung  . Hypertension Sister   . Asthma Brother   . Diabetes Maternal Grandmother   . Hypertension Maternal Grandmother   . Hypertension Maternal Grandfather   . Emphysema Paternal Grandfather     Social History Social History   Tobacco Use  . Smoking status: Current Some Day Smoker    Packs/day: 0.50    Years: 4.00    Pack years: 2.00    Types: Cigarettes    Start date: 07/24/2014    Last attempt to quit: 05/27/2015    Years since quitting: 2.6  . Smokeless tobacco: Never Used  Substance Use Topics  . Alcohol use: No    Alcohol/week: 0.0 oz  . Drug use: No   Allergies Other and Penicillins  Review of Systems Review of Systems All other systems are reviewed and are negative for acute change except as noted in the HPI  Physical Exam Vital Signs  I have reviewed the triage vital signs BP (!) 146/99 (BP Location: Left Arm)   Pulse (!) 109   Temp 97.9 F (36.6 C) (Oral)   Resp 18   Ht 5' 2.5" (1.588 m)   Wt (!) 166.5 kg (367 lb)   SpO2 100%   BMI 66.06 kg/m   Physical Exam  Constitutional: She is oriented to person, place, and time. She appears well-developed and well-nourished. No  distress.  HENT:  Head: Normocephalic and atraumatic.  Right Ear: External ear normal.  Left Ear: External ear normal.  Nose: Nose normal.  Eyes: Conjunctivae and EOM are normal. No scleral icterus.  Neck: Normal range of motion and phonation normal.  Cardiovascular: Normal rate and regular rhythm.  Pulmonary/Chest: Effort normal. No stridor. No respiratory distress.  Abdominal: She exhibits no distension.  Genitourinary:     Musculoskeletal: Normal range of motion. She exhibits no edema.  Neurological: She is alert and oriented to person, place, and time.  Skin: She is not diaphoretic.  Psychiatric: She has a normal mood and affect. Her behavior is normal.  Vitals reviewed.   ED Results and Treatments Labs (all labs ordered are listed, but only abnormal results are displayed) Labs Reviewed - No data to display                                                                                                                       EKG  EKG Interpretation  Date/Time:    Ventricular Rate:    PR Interval:    QRS Duration:   QT Interval:    QTC Calculation:   R Axis:     Text Interpretation:        Radiology No results found. Pertinent labs & imaging results that were available during my care of the patient were reviewed by me  and considered in my medical decision making (see chart for details).  Medications Ordered in ED Medications  lidocaine (XYLOCAINE) 2 % jelly 1 application (has no administration in time range)  liver oil-zinc oxide (DESITIN) 40 % ointment (has no administration in time range)                                                                                                                                    Procedures Procedures  (including critical care time)  Medical Decision Making / ED Course I have reviewed the nursing notes for this encounter and the patient's prior records (if available in EHR or on provided paperwork).    1. Headache Non  focal neuro exam. No recent head trauma. No fever. Doubt meningitis. Doubt intracranial bleed. Doubt IIH. No indication for imaging.   2. Fissures Numerous fissures to perineal creases. No evidence of deep fistula. No superimposed infection noted. No Abd pain or bloody stools concerning for Crohn's flare.  Recommend continue supportive management with frequent sitz baths in the shower.  Will prescribe the patient with topical lidocaine to assist with pain.  Recommend close follow-up with PCP, wound care, and possibly surgeon to assist with more aggressive management.  3. Mons pubis papules Most consistent with ingrown hairs.  The patient appears reasonably screened and/or stabilized for discharge and I doubt any other medical condition or other [facility] requiring further screening, evaluation, or treatment in the ED at this time prior to discharge.  The patient is safe for discharge with strict return precautions.   Final Clinical Impression(s) / ED Diagnoses Final diagnoses:  Episodic tension-type headache, not intractable  Skin fissures  Ingrown hair    Disposition: Discharge  Condition: Good  I have discussed the results, Dx and Tx plan with the patient who expressed understanding and agree(s) with the plan. Discharge instructions discussed at great length. The patient was given strict return precautions who verbalized understanding of the instructions. No further questions at time of discharge.    ED Discharge Orders        Ordered    lidocaine (XYLOCAINE) 5 % ointment  4 times daily PRN     02/05/18 0939       Follow Up: Benito Mccreedy, MD [address] [place] [address] [phone]  Schedule an appointment as soon as possible for a visit  in 5-7 days, For close follow up to assess for fissures  Surgeon  Schedule an appointment as soon as possible for a visit  in 1-2 weeks, For close follow up to assess for fissures.  [facility]              [address] [id number] [phone] Schedule an appointment as soon as possible for a visit  For close follow up to assess for fissures     This chart was dictated  using voice recognition software.  Despite best efforts to proofread,  errors can occur which can change the documentation meaning.     Fatima Blank, MD 02/05/18 [phone]

## 2018-04-03 ENCOUNTER — Telehealth: Payer: Self-pay | Admitting: Hematology

## 2018-04-03 NOTE — Telephone Encounter (Signed)
No Note needed incorrect account

## 2018-04-04 ENCOUNTER — Emergency Department (HOSPITAL_COMMUNITY)
Admission: EM | Admit: 2018-04-04 | Discharge: 2018-04-05 | Disposition: A | Discharge status: Home / Self Care | Payer: Medicaid Other | Attending: Emergency Medicine | Admitting: Emergency Medicine

## 2018-04-04 ENCOUNTER — Encounter (HOSPITAL_COMMUNITY): Payer: Self-pay | Admitting: Emergency Medicine

## 2018-04-04 ENCOUNTER — Other Ambulatory Visit: Payer: Self-pay

## 2018-04-04 ENCOUNTER — Emergency Department (HOSPITAL_COMMUNITY): Payer: Medicaid Other

## 2018-04-04 DIAGNOSIS — R234 Changes in skin texture: Secondary | ICD-10-CM | POA: Insufficient documentation

## 2018-04-04 DIAGNOSIS — Z79899 Other long term (current) drug therapy: Secondary | ICD-10-CM | POA: Insufficient documentation

## 2018-04-04 DIAGNOSIS — F17210 Nicotine dependence, cigarettes, uncomplicated: Secondary | ICD-10-CM | POA: Insufficient documentation

## 2018-04-04 DIAGNOSIS — J45909 Unspecified asthma, uncomplicated: Secondary | ICD-10-CM | POA: Diagnosis not present

## 2018-04-04 DIAGNOSIS — J302 Other seasonal allergic rhinitis: Secondary | ICD-10-CM | POA: Diagnosis not present

## 2018-04-04 DIAGNOSIS — I509 Heart failure, unspecified: Secondary | ICD-10-CM | POA: Insufficient documentation

## 2018-04-04 DIAGNOSIS — L98491 Non-pressure chronic ulcer of skin of other sites limited to breakdown of skin: Secondary | ICD-10-CM

## 2018-04-04 DIAGNOSIS — R519 Headache, unspecified: Secondary | ICD-10-CM

## 2018-04-04 DIAGNOSIS — I110 Hypertensive heart disease with heart failure: Secondary | ICD-10-CM | POA: Insufficient documentation

## 2018-04-04 DIAGNOSIS — R079 Chest pain, unspecified: Secondary | ICD-10-CM | POA: Diagnosis present

## 2018-04-04 DIAGNOSIS — R51 Headache: Secondary | ICD-10-CM

## 2018-04-04 LAB — BASIC METABOLIC PANEL
Anion gap: 7 (ref 5–15)
BUN: 12 mg/dL (ref 6–20)
CALCIUM: 9 mg/dL (ref 8.9–10.3)
CO2: 18 mmol/L — AB (ref 22–32)
CREATININE: 1.04 mg/dL — AB (ref 0.44–1.00)
Chloride: 111 mmol/L (ref 98–111)
Glucose, Bld: 81 mg/dL (ref 70–99)
Potassium: 4.2 mmol/L (ref 3.5–5.1)
SODIUM: 136 mmol/L (ref 135–145)

## 2018-04-04 LAB — CBC
HCT: 30.4 % — ABNORMAL LOW (ref 36.0–46.0)
Hemoglobin: 8.4 g/dL — ABNORMAL LOW (ref 12.0–15.0)
MCH: 20 pg — AB (ref 26.0–34.0)
MCHC: 27.6 g/dL — ABNORMAL LOW (ref 30.0–36.0)
MCV: 72.2 fL — ABNORMAL LOW (ref 78.0–100.0)
PLATELETS: 493 10*3/uL — AB (ref 150–400)
RBC: 4.21 MIL/uL (ref 3.87–5.11)
RDW: 19.4 % — ABNORMAL HIGH (ref 11.5–15.5)
WBC: 7.8 10*3/uL (ref 4.0–10.5)

## 2018-04-04 LAB — I-STAT TROPONIN, ED: TROPONIN I, POC: 0 ng/mL (ref 0.00–0.08)

## 2018-04-04 MED ORDER — DIPHENHYDRAMINE HCL 50 MG/ML IJ SOLN
25.0000 mg | Freq: Once | INTRAMUSCULAR | Status: DC
  Filled 2018-04-04: qty 1

## 2018-04-04 MED ORDER — METOCLOPRAMIDE HCL 5 MG/ML IJ SOLN
10.0000 mg | Freq: Once | INTRAMUSCULAR | Status: DC
  Filled 2018-04-04: qty 2

## 2018-04-04 NOTE — ED Provider Notes (Addendum)
[facility] EMERGENCY DEPARTMENT Provider Note   CSN: [id number] Arrival date & time: 04/04/18  2057     History   Chief Complaint Chief Complaint  Patient presents with  . Chest Pain    HPI Dominique [name] is a 37 y.o. female.  The history is provided by the patient.  She has history of hypertension, sickle cell anemia, coronary artery disease, congestive heart failure, Crohn's disease status post total colectomy and comes in with multiple complaints.  For the past 2 months, she has had perirectal and vulvar pain.  She has had fissures which have been treated with multiple modalities which have not gotten better.  She is also complaining of a headache which is occipital and right periorbital.  This is similar to prior headaches only worse.  There is associated photophobia, nausea.  She was told that it was sinus headache, but it has not responded to allergy medications and antibiotics.  She is also complaining of feeling generally weak and loss of appetite.  She has been taking acetaminophen 2 g twice a day for the last several weeks but states it does not seem to be helping anymore.  She feels that she is becoming immune to acetaminophen.  She is complaining of some easy fatigability and some vague chest discomfort.  Of note, she is not currently on any medications for her Crohn's disease.  She denies fever or chills.  She has had nausea but no vomiting.  She has not had any change in her normal 2-3 bowel movements a day.  Past Medical History:  Diagnosis Date  . Acute acalculous cholecystitis 11/16/2014  . Anal fistula   . Anemia   . Anxiety   . Asthma   . CHF (congestive heart failure) ([place])   . Complication of anesthesia    states she had a problem staying awake after her c-section, was [facility]  . Coronary artery disease   . Diverticulitis of colon 11/17/2014  . Headache    used to have migraines  . Hepatic steatosis 11/17/2014  . Herpes    . Hypertension   . Morbid obesity ([place]) 03/22/2009   Qualifier: Diagnosis of  By: Ronnald Ramp CMA, Chemira    . Neuropathy   . OSA (obstructive sleep apnea) 05/02/2014  . Seasonal allergies 06/13/2014  . Sickle cell anemia ([place])    She states she " has the trait" (07/20/15)  . Sleep apnea    uses Bipap  . Thyromegaly 05/02/2014    Patient Active Problem List   Diagnosis Date Noted  . 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 Colostomy in place [facility]) 01/06/2017  . 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 Infliximab (Remicade) long-term use 01/06/2017  . Crohn's colitis, with abscess ([place]) 01/06/2017  . Abdominal abscess 12/25/2016  . Chronic anemia 12/25/2016  . Hypokalemia 07/21/2015  . Adjustment disorder with mixed anxiety and depressed mood   . Physical deconditioning 06/26/2015  . Severe left ventricular systolic dysfunction 85/10/7739  . Pulmonary hypertension ([place])   . Crohn's colitis with perforation s/p left colectomy/colostomy 2016   . OSA on CPAP   . Obesity hypoventilation syndrome ([place])   . Pressure ulcer 06/16/2015  . Protein-calorie malnutrition, severe ([place]) 06/10/2015  . Gastritis 04/07/2015  . Iron deficiency anemia 04/07/2015  . Sinus tachycardia ([place]) 12/29/2014  . Lumbar back pain 12/06/2014  . Rectal bleeding 11/19/2014  . Diverticulitis of colon 11/17/2014  . Hepatic steatosis 11/17/2014  .  Seasonal allergies 06/13/2014  . Thyromegaly 05/02/2014  . AMENORRHEA 02/07/2010  . Morbid obesity- BMI 67.5 03/22/2009  . Sickle cell trait ([place]) 10/20/2007    Past Surgical History:  Procedure Laterality Date  . c section 2011  2011  . COLONOSCOPY WITH PROPOFOL N/A 12/27/2015   Procedure: COLONOSCOPY WITH PROPOFOL;  Surgeon: Wonda Horner, MD;  Location: [facility] ENDOSCOPY;  Service: Endoscopy;  Laterality: N/A;  . COLOSTOMY N/A 06/11/2015   Procedure:  CREATION OF COLOSTOMY;  Surgeon: Georganna Skeans, MD;  Location: [facility];  Service:  General;  Laterality: N/A;  . ESOPHAGOGASTRODUODENOSCOPY N/A 07/19/2015   Procedure: ESOPHAGOGASTRODUODENOSCOPY (EGD);  Surgeon: Clarene Essex, MD;  Location: [facility] ENDOSCOPY;  Service: Endoscopy;  Laterality: N/A;  . FLEXIBLE SIGMOIDOSCOPY N/A 06/05/2015   Procedure: FLEXIBLE SIGMOIDOSCOPY;  Surgeon: Ronald Lobo, MD;  Location[facility] ENDOSCOPY;  Service: Endoscopy;  Laterality: N/A;  . LAPAROTOMY N/A 06/09/2015   Procedure: EXPLORATORY LAPAROTOMY, DRAINAGE OF INTRAABDOMINAL ABSCESS, PARTIAL COLON RESECTION,  APPLICATION OF WOUND VAC;  Surgeon: Georganna Skeans, MD;  Location: [place];  Service: General;  Laterality: N/A;  . LAPAROTOMY N/A 06/11/2015   Procedure: RE-EXPLORATION OF ABDOMEN;  Surgeon: Georganna Skeans, MD;  Location: [place];  Service: General;  Laterality: N/A;     OB History   None      Home Medications    Prior to Admission medications   Medication Sig Start Date End Date Taking? Authorizing Provider  acetaminophen (TYLENOL) 500 MG tablet Take 1,000 mg by mouth every 6 (six) hours as needed for mild pain, moderate pain or headache.    [provider]  albuterol (PROVENTIL) (2.5 MG/3ML) 0.083% nebulizer solution Take 3 mLs by nebulization every 6 (six) hours as needed for wheezing or shortness of breath.  12/07/17   [provider]  bacitracin ointment Apply 1 application topically 2 (two) times daily. Patient taking differently: Apply 1 application topically every 6 (six) hours as needed for wound care.  12/12/17   Hayden Rasmussen, MD  ibuprofen (ADVIL,MOTRIN) 200 MG tablet Take 1,200 mg by mouth every 8 (eight) hours as needed for headache, mild pain or moderate pain.     [provider]  lidocaine (XYLOCAINE) 5 % ointment Apply 1 application topically 4 (four) times daily as needed. 02/05/18   Fatima Blank, MD  PROAIR HFA 108 [phone] Base) MCG/ACT inhaler Inhale 2 puffs into the lungs 4 (four) times daily as needed for wheezing or shortness of breath.   12/07/17   [provider]  tiZANidine (ZANAFLEX) 4 MG tablet Take 4 mg by mouth 2 (two) times daily as needed for muscle spasms.  12/03/17   [provider]  valACYclovir (VALTREX) 1000 MG tablet Take 1 tablet (1,000 mg total) by mouth 3 (three) times daily. Patient not taking: Reported on 02/05/2018 12/12/17   Hayden Rasmussen, MD    Family History Family History  Problem Relation Age of Onset  . Hypertension Mother   . Allergies Mother   . Hypertension Father   . Cancer Paternal Grandmother        lung  . Hypertension Sister   . Asthma Brother   . Diabetes Maternal Grandmother   . Hypertension Maternal Grandmother   . Hypertension Maternal Grandfather   . Emphysema Paternal Grandfather     Social History Social History   Tobacco Use  . Smoking status: Current Some Day Smoker    Packs/day: 0.50    Years: 4.00    Pack years: 2.00  Types: Cigarettes    Start date: 07/24/2014    Last attempt to quit: 05/27/2015    Years since quitting: 2.8  . Smokeless tobacco: Never Used  Substance Use Topics  . Alcohol use: No    Alcohol/week: 0.0 oz  . Drug use: No     Allergies   Other and Penicillins   Review of Systems Review of Systems  All other systems reviewed and are negative.    Physical Exam Updated Vital Signs BP 117/81 (BP Location: Right Wrist)   Pulse (!) 103   Temp 98 F (36.7 C) (Oral)   Resp 20   Ht 5' 2"  (1.575 m)   Wt (!) 165.1 kg (364 lb)   LMP 03/28/2018 (Approximate) Comment: pt shielded  SpO2 100%   BMI 66.58 kg/m   Physical Exam  Nursing note and vitals reviewed.  Morbidly obese 37 year old female, resting comfortably and in no acute distress. Vital signs are significant for borderline elevated heart rate. Oxygen saturation is 100%, which is normal. Head is normocephalic and atraumatic. PERRLA, EOMI. Oropharynx is clear.  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  Moderate tenderness over the right temporalis muscle, no tenderness over insertion of paracervical muscles. Neck is nontender and supple without adenopathy or JVD. Back is nontender and there is no CVA tenderness. Lungs are clear without rales, wheezes, or rhonchi. Chest is nontender. Heart has regular rate and rhythm without murmur. Abdomen is soft, flat, nontender without masses or hepatosplenomegaly and peristalsis is normoactive. Pelvic: Normal external genitalia.  Several small ulcerations present over the mons pubis.  Deep fissure present in both inguinal folds with some slight purulent drainage.  No anal fissures seen, but ulceration present on the right medial gluteal fold. Extremities have no cyanosis or edema, full range of motion is present. Skin is warm and dry without rash. Neurologic: Mental status is normal, cranial nerves are intact, there are no motor or sensory deficits.  ED Treatments / Results  Labs (all labs ordered are listed, but only abnormal results are displayed) Labs Reviewed  BASIC METABOLIC PANEL - Abnormal; Notable for the following components:      Result Value   CO2 18 (*)    Creatinine, Ser 1.04 (*)    All other components within normal limits  CBC - Abnormal; Notable for the following components:   Hemoglobin 8.4 (*)    HCT 30.4 (*)    MCV 72.2 (*)    MCH 20.0 (*)    MCHC 27.6 (*)    RDW 19.4 (*)    Platelets 493 (*)    All other components within normal limits  HEPATIC FUNCTION PANEL - Abnormal; Notable for the following components:   Albumin 3.2 (*)    All other components within normal limits  RAPID URINE DRUG SCREEN, HOSP PERFORMED - Abnormal; Notable for the following components:   Barbiturates   (*)    Value: Result not available. Reagent lot number recalled by manufacturer.   All other components within normal limits  D-DIMER, QUANTITATIVE (NOT AT [facility])  DIFFERENTIAL    I-STAT TROPONIN, ED  I-STAT BETA HCG BLOOD, ED ([facility], [facility], [facility] ONLY)    EKG EKG Interpretation  Date/Time:  Saturday April 04 2018 20:59:30 EDT Ventricular Rate:  97 PR Interval:  144 QRS Duration: 84 QT Interval:  340 QTC Calculation: 431 R Axis:   -21  Text Interpretation:  Normal sinus rhythm Nonspecific T wave abnormality Abnormal ECG When compared with ECG of 07/15/2015, HEART RATE has decreased Nonspecific T wave abnormality has improved Nonspecific ST abnormality is no longer present Confirmed by Delora Fuel ([id number]) on 04/04/2018 11:23:07 PM   Radiology Dg Chest 2 View  Result Date: 04/04/2018 CLINICAL DATA:  Patient arrived via EMS, from home, per EMS patient complaining of chest pain that radiates across shoulders, ems gave 325 aspirin with relief. Also c/o SOB pta. In triage no c/o of sob or cp, lightheadeness and "pressure in my right eye". Hx of asthma and chron's disease. VSS in triage and no acute distress noted. Pt also complaining of pain in her perineum and rectum-states dr diagnosed her with anal fissures EXAM: CHEST - 2 VIEW COMPARISON:  06/14/2015 FINDINGS: The heart size and mediastinal contours are within normal limits. Both lungs are clear. No pleural effusion or pneumothorax. The visualized skeletal structures are unremarkable. IMPRESSION: No active cardiopulmonary disease. Electronically Signed   By: Lajean Manes M.D.   On: 04/04/2018 21:48    Procedures Procedures   Medications Ordered in ED Medications  oxyCODONE-acetaminophen (PERCOCET/ROXICET) 5-325 MG per tablet 1 tablet (1 tablet Oral Given 04/05/18 0118)  diphenhydrAMINE (BENADRYL) capsule 25 mg (25 mg Oral Given 04/05/18 0119)  metoCLOPramide (REGLAN) injection 10 mg (10 mg Intramuscular Given 04/05/18 0119)     Initial Impression / Assessment and Plan / ED Course  I have reviewed the triage vital signs and the nursing notes.  Pertinent labs & imaging results that were available during my care of the  patient were reviewed by me and considered in my medical decision making (see chart for details).  Multiple complaints.  Headache is likely muscle contraction headache.  Will give trial of metoclopramide with diphenhydramine.  Fatigue and vague chest discomfort of uncertain cause.  She does have tachycardia and is somewhat deconditioned, will screen for pulmonary embolism with d-dimer.  ECG and troponin are unremarkable.  Chest x-ray is unremarkable.  Old records are reviewed, and she had an ED visit with similar complaints 2 months ago.  I am concerned about the quantity of acetaminophen that she is consuming and will check hepatic function to make sure that she is not showing signs of chronic acetaminophen toxicity.  I suspect that most of her problems are related to her Crohn's disease, and she will likely need to follow-up with her gastroenterologist.  She was supposed to be restarted on Remicade 3 months ago, but states that there are multiple things going on in her life that prevented her from restarting it.  Deep fissures in the inguinal folds are cause of much of her pain.  These are likely to need multidisciplinary approach of a wound care clinic to clear.  Laboratory work-up is reassuring.  Anemia is present secondary to known sickle cell disease and within her usual range.  Will try a course of doxycycline and fluconazole to see if this gives some improvement.  She is referred to wound care clinic for follow-up.  May need to be reevaluated by her gastroenterologist and may need evaluation by dermatology.  Also, she did get good relief of her periorbital pain with metoclopramide and is given prescription for that to use as needed.  Final Clinical Impressions(s) / ED Diagnoses   Final diagnoses:  Skin fissures  Skin ulcer, limited to breakdown of skin [facility])    ED Discharge Orders        Ordered    doxycycline (  VIBRAMYCIN) 100 MG capsule  2 times daily     04/05/18 0309    fluconazole  (DIFLUCAN) 100 MG tablet  Daily     04/05/18 0309    traMADol (ULTRAM) 50 MG tablet  Every 6 hours PRN     04/05/18 0309    Ambulatory referral to Wound Clinic     30/10/40 [id number]       Delora Fuel, MD 36/85/99 [id number]    Delora Fuel, MD 44/36/01 [phone]

## 2018-04-04 NOTE — ED Triage Notes (Addendum)
Patient arrived via EMS, from home, per EMS patient complaining of chest pain that radiates across shoulders, ems gave 325 aspirin with relief. Also c/o SOB pta. In triage no c/o of sob or cp, lightheadeness and "pressure in my right eye". Hx of asthma and chron's disease. VSS in triage and no acute distress noted. Pt also complaining of pain in her perineum and rectum-states dr diagnosed her with anal fissures.

## 2018-04-05 LAB — HEPATIC FUNCTION PANEL
ALT: 10 U/L (ref 0–44)
AST: 15 U/L (ref 15–41)
Albumin: 3.2 g/dL — ABNORMAL LOW (ref 3.5–5.0)
Alkaline Phosphatase: 67 U/L (ref 38–126)
Total Bilirubin: 0.5 mg/dL (ref 0.3–1.2)
Total Protein: 7.1 g/dL (ref 6.5–8.1)

## 2018-04-05 LAB — DIFFERENTIAL
Abs Immature Granulocytes: 0 10*3/uL (ref 0.0–0.1)
BASOS ABS: 0.1 10*3/uL (ref 0.0–0.1)
Basophils Relative: 1 %
EOS ABS: 0.2 10*3/uL (ref 0.0–0.7)
EOS PCT: 3 %
IMMATURE GRANULOCYTES: 0 %
LYMPHS ABS: 1.3 10*3/uL (ref 0.7–4.0)
Lymphocytes Relative: 18 %
Monocytes Absolute: 0.6 10*3/uL (ref 0.1–1.0)
Monocytes Relative: 8 %
Neutro Abs: 5.4 10*3/uL (ref 1.7–7.7)
Neutrophils Relative %: 70 %

## 2018-04-05 LAB — RAPID URINE DRUG SCREEN, HOSP PERFORMED
Amphetamines: NOT DETECTED
Benzodiazepines: NOT DETECTED
COCAINE: NOT DETECTED
Opiates: NOT DETECTED
Tetrahydrocannabinol: NOT DETECTED

## 2018-04-05 LAB — D-DIMER, QUANTITATIVE (NOT AT ARMC): D DIMER QUANT: 0.42 ug{FEU}/mL (ref 0.00–0.50)

## 2018-04-05 MED ORDER — DIPHENHYDRAMINE HCL 25 MG PO CAPS
25.0000 mg | ORAL_CAPSULE | Freq: Once | ORAL | Status: AC
  Administered 2018-04-05: 25 mg via ORAL
  Filled 2018-04-05: qty 1

## 2018-04-05 MED ORDER — TRAMADOL HCL 50 MG PO TABS: 50.0000 mg | ORAL_TABLET | Freq: Four times a day (QID) | ORAL | 0 refills | Status: DC | PRN

## 2018-04-05 MED ORDER — DOXYCYCLINE HYCLATE 100 MG PO CAPS: 100.0000 mg | ORAL_CAPSULE | Freq: Two times a day (BID) | ORAL | 0 refills | Status: DC

## 2018-04-05 MED ORDER — METOCLOPRAMIDE HCL 10 MG PO TABS: 10.0000 mg | ORAL_TABLET | Freq: Four times a day (QID) | ORAL | 0 refills | Status: DC | PRN

## 2018-04-05 MED ORDER — METOCLOPRAMIDE HCL 5 MG/ML IJ SOLN
10.0000 mg | Freq: Once | INTRAMUSCULAR | Status: AC
  Administered 2018-04-05: 10 mg via INTRAMUSCULAR
  Filled 2018-04-05: qty 2

## 2018-04-05 MED ORDER — FLUCONAZOLE 100 MG PO TABS: 200.0000 mg | ORAL_TABLET | Freq: Every day | ORAL | 0 refills | Status: DC

## 2018-04-05 MED ORDER — OXYCODONE-ACETAMINOPHEN 5-325 MG PO TABS
1.0000 | ORAL_TABLET | Freq: Once | ORAL | Status: AC
  Administered 2018-04-05: 1 via ORAL
  Filled 2018-04-05: qty 1

## 2018-04-05 NOTE — Discharge Instructions (Addendum)
Talk with your primary care provider about possible referral back to your gastroenterologist. Consider referral to a dermatologist.   Do not take more than 1000 mg acetaminophen at a time - lager doses can hurt your liver.

## 2018-04-18 ENCOUNTER — Encounter (HOSPITAL_COMMUNITY): Payer: Self-pay | Admitting: Nurse Practitioner

## 2018-04-18 DIAGNOSIS — I2510 Atherosclerotic heart disease of native coronary artery without angina pectoris: Secondary | ICD-10-CM | POA: Diagnosis present

## 2018-04-18 DIAGNOSIS — J302 Other seasonal allergic rhinitis: Secondary | ICD-10-CM | POA: Diagnosis present

## 2018-04-18 DIAGNOSIS — D638 Anemia in other chronic diseases classified elsewhere: Secondary | ICD-10-CM | POA: Diagnosis present

## 2018-04-18 DIAGNOSIS — R234 Changes in skin texture: Secondary | ICD-10-CM | POA: Diagnosis present

## 2018-04-18 DIAGNOSIS — Z880 Allergy status to penicillin: Secondary | ICD-10-CM

## 2018-04-18 DIAGNOSIS — E860 Dehydration: Secondary | ICD-10-CM | POA: Diagnosis present

## 2018-04-18 DIAGNOSIS — L98498 Non-pressure chronic ulcer of skin of other sites with other specified severity: Secondary | ICD-10-CM | POA: Diagnosis present

## 2018-04-18 DIAGNOSIS — Z888 Allergy status to other drugs, medicaments and biological substances status: Secondary | ICD-10-CM

## 2018-04-18 DIAGNOSIS — F17210 Nicotine dependence, cigarettes, uncomplicated: Secondary | ICD-10-CM | POA: Diagnosis present

## 2018-04-18 DIAGNOSIS — B372 Candidiasis of skin and nail: Principal | ICD-10-CM | POA: Diagnosis present

## 2018-04-18 DIAGNOSIS — D571 Sickle-cell disease without crisis: Secondary | ICD-10-CM | POA: Diagnosis present

## 2018-04-18 DIAGNOSIS — B009 Herpesviral infection, unspecified: Secondary | ICD-10-CM | POA: Diagnosis present

## 2018-04-18 DIAGNOSIS — Z933 Colostomy status: Secondary | ICD-10-CM

## 2018-04-18 DIAGNOSIS — E0781 Sick-euthyroid syndrome: Secondary | ICD-10-CM | POA: Diagnosis present

## 2018-04-18 DIAGNOSIS — K909 Intestinal malabsorption, unspecified: Secondary | ICD-10-CM | POA: Diagnosis present

## 2018-04-18 DIAGNOSIS — I272 Pulmonary hypertension, unspecified: Secondary | ICD-10-CM | POA: Diagnosis present

## 2018-04-18 DIAGNOSIS — G629 Polyneuropathy, unspecified: Secondary | ICD-10-CM | POA: Diagnosis present

## 2018-04-18 DIAGNOSIS — F4323 Adjustment disorder with mixed anxiety and depressed mood: Secondary | ICD-10-CM | POA: Diagnosis present

## 2018-04-18 DIAGNOSIS — H16001 Unspecified corneal ulcer, right eye: Secondary | ICD-10-CM | POA: Diagnosis present

## 2018-04-18 DIAGNOSIS — Z6841 Body Mass Index (BMI) 40.0 and over, adult: Secondary | ICD-10-CM

## 2018-04-18 DIAGNOSIS — K760 Fatty (change of) liver, not elsewhere classified: Secondary | ICD-10-CM | POA: Diagnosis present

## 2018-04-18 DIAGNOSIS — K5010 Crohn's disease of large intestine without complications: Secondary | ICD-10-CM | POA: Diagnosis present

## 2018-04-18 DIAGNOSIS — Z7951 Long term (current) use of inhaled steroids: Secondary | ICD-10-CM

## 2018-04-18 DIAGNOSIS — G4733 Obstructive sleep apnea (adult) (pediatric): Secondary | ICD-10-CM | POA: Diagnosis present

## 2018-04-18 DIAGNOSIS — H1013 Acute atopic conjunctivitis, bilateral: Secondary | ICD-10-CM | POA: Diagnosis present

## 2018-04-18 DIAGNOSIS — Z8249 Family history of ischemic heart disease and other diseases of the circulatory system: Secondary | ICD-10-CM

## 2018-04-18 DIAGNOSIS — N179 Acute kidney failure, unspecified: Secondary | ICD-10-CM | POA: Diagnosis present

## 2018-04-18 DIAGNOSIS — Z79899 Other long term (current) drug therapy: Secondary | ICD-10-CM

## 2018-04-18 DIAGNOSIS — D509 Iron deficiency anemia, unspecified: Secondary | ICD-10-CM | POA: Diagnosis present

## 2018-04-18 DIAGNOSIS — Z9049 Acquired absence of other specified parts of digestive tract: Secondary | ICD-10-CM

## 2018-04-18 NOTE — ED Triage Notes (Signed)
Pt arrives via EMS c/o bilateral hip pain. Hx fissures bilateral hips, goes to wound clinic. Unable to tolerate pain until next wound clinic visit. Able to transfer from stretcher to wheel chair in triage.

## 2018-04-19 ENCOUNTER — Inpatient Hospital Stay (HOSPITAL_COMMUNITY)
Admission: EM | Admit: 2018-04-19 | Discharge: 2018-04-23 | DRG: 607 | Disposition: A | Discharge status: Home Health | Payer: Medicaid Other | Attending: Family Medicine | Admitting: Family Medicine

## 2018-04-19 ENCOUNTER — Other Ambulatory Visit: Payer: Self-pay

## 2018-04-19 DIAGNOSIS — Z6841 Body Mass Index (BMI) 40.0 and over, adult: Secondary | ICD-10-CM

## 2018-04-19 DIAGNOSIS — H5789 Other specified disorders of eye and adnexa: Secondary | ICD-10-CM | POA: Diagnosis not present

## 2018-04-19 DIAGNOSIS — H16001 Unspecified corneal ulcer, right eye: Secondary | ICD-10-CM | POA: Diagnosis present

## 2018-04-19 DIAGNOSIS — K760 Fatty (change of) liver, not elsewhere classified: Secondary | ICD-10-CM | POA: Diagnosis present

## 2018-04-19 DIAGNOSIS — Z9989 Dependence on other enabling machines and devices: Secondary | ICD-10-CM | POA: Diagnosis not present

## 2018-04-19 DIAGNOSIS — D573 Sickle-cell trait: Secondary | ICD-10-CM | POA: Diagnosis not present

## 2018-04-19 DIAGNOSIS — L089 Local infection of the skin and subcutaneous tissue, unspecified: Secondary | ICD-10-CM | POA: Diagnosis present

## 2018-04-19 DIAGNOSIS — R238 Other skin changes: Secondary | ICD-10-CM | POA: Diagnosis not present

## 2018-04-19 DIAGNOSIS — N179 Acute kidney failure, unspecified: Secondary | ICD-10-CM | POA: Diagnosis present

## 2018-04-19 DIAGNOSIS — N289 Disorder of kidney and ureter, unspecified: Secondary | ICD-10-CM | POA: Diagnosis present

## 2018-04-19 DIAGNOSIS — F4323 Adjustment disorder with mixed anxiety and depressed mood: Secondary | ICD-10-CM | POA: Diagnosis present

## 2018-04-19 DIAGNOSIS — R7989 Other specified abnormal findings of blood chemistry: Secondary | ICD-10-CM | POA: Diagnosis not present

## 2018-04-19 DIAGNOSIS — H109 Unspecified conjunctivitis: Secondary | ICD-10-CM | POA: Diagnosis not present

## 2018-04-19 DIAGNOSIS — F17210 Nicotine dependence, cigarettes, uncomplicated: Secondary | ICD-10-CM | POA: Diagnosis present

## 2018-04-19 DIAGNOSIS — H1013 Acute atopic conjunctivitis, bilateral: Secondary | ICD-10-CM | POA: Diagnosis present

## 2018-04-19 DIAGNOSIS — E0781 Sick-euthyroid syndrome: Secondary | ICD-10-CM | POA: Diagnosis present

## 2018-04-19 DIAGNOSIS — K50118 Crohn's disease of large intestine with other complication: Secondary | ICD-10-CM | POA: Diagnosis present

## 2018-04-19 DIAGNOSIS — K909 Intestinal malabsorption, unspecified: Secondary | ICD-10-CM | POA: Diagnosis present

## 2018-04-19 DIAGNOSIS — B372 Candidiasis of skin and nail: Secondary | ICD-10-CM | POA: Diagnosis present

## 2018-04-19 DIAGNOSIS — H18891 Other specified disorders of cornea, right eye: Secondary | ICD-10-CM

## 2018-04-19 DIAGNOSIS — E860 Dehydration: Secondary | ICD-10-CM | POA: Diagnosis present

## 2018-04-19 DIAGNOSIS — D509 Iron deficiency anemia, unspecified: Secondary | ICD-10-CM | POA: Diagnosis present

## 2018-04-19 DIAGNOSIS — H5711 Ocular pain, right eye: Secondary | ICD-10-CM | POA: Diagnosis present

## 2018-04-19 DIAGNOSIS — D571 Sickle-cell disease without crisis: Secondary | ICD-10-CM | POA: Diagnosis present

## 2018-04-19 DIAGNOSIS — L98498 Non-pressure chronic ulcer of skin of other sites with other specified severity: Secondary | ICD-10-CM | POA: Diagnosis present

## 2018-04-19 DIAGNOSIS — B009 Herpesviral infection, unspecified: Secondary | ICD-10-CM | POA: Diagnosis present

## 2018-04-19 DIAGNOSIS — R234 Changes in skin texture: Secondary | ICD-10-CM | POA: Diagnosis present

## 2018-04-19 DIAGNOSIS — G4733 Obstructive sleep apnea (adult) (pediatric): Secondary | ICD-10-CM

## 2018-04-19 DIAGNOSIS — G629 Polyneuropathy, unspecified: Secondary | ICD-10-CM | POA: Diagnosis present

## 2018-04-19 DIAGNOSIS — I2510 Atherosclerotic heart disease of native coronary artery without angina pectoris: Secondary | ICD-10-CM | POA: Diagnosis present

## 2018-04-19 DIAGNOSIS — J302 Other seasonal allergic rhinitis: Secondary | ICD-10-CM | POA: Diagnosis present

## 2018-04-19 DIAGNOSIS — I272 Pulmonary hypertension, unspecified: Secondary | ICD-10-CM | POA: Diagnosis present

## 2018-04-19 DIAGNOSIS — K5010 Crohn's disease of large intestine without complications: Secondary | ICD-10-CM | POA: Diagnosis present

## 2018-04-19 DIAGNOSIS — T148XXA Other injury of unspecified body region, initial encounter: Secondary | ICD-10-CM

## 2018-04-19 DIAGNOSIS — D638 Anemia in other chronic diseases classified elsewhere: Secondary | ICD-10-CM | POA: Diagnosis present

## 2018-04-19 LAB — RETICULOCYTES
RBC.: 3.99 MIL/uL (ref 3.87–5.11)
RETIC CT PCT: 1.1 % (ref 0.4–3.1)
Retic Count, Absolute: 43.9 10*3/uL (ref 19.0–186.0)

## 2018-04-19 LAB — IRON AND TIBC
Iron: 27 ug/dL — ABNORMAL LOW (ref 28–170)
Saturation Ratios: 7 % — ABNORMAL LOW (ref 10.4–31.8)
TIBC: 376 ug/dL (ref 250–450)
UIBC: 349 ug/dL

## 2018-04-19 LAB — T4, FREE: Free T4: 0.65 ng/dL — ABNORMAL LOW (ref 0.82–1.77)

## 2018-04-19 LAB — CBC
HEMATOCRIT: 27.3 % — AB (ref 36.0–46.0)
HEMOGLOBIN: 8.4 g/dL — AB (ref 12.0–15.0)
MCH: 21.1 pg — AB (ref 26.0–34.0)
MCHC: 30.8 g/dL (ref 30.0–36.0)
MCV: 68.4 fL — ABNORMAL LOW (ref 78.0–100.0)
Platelets: 353 10*3/uL (ref 150–400)
RBC: 3.99 MIL/uL (ref 3.87–5.11)
RDW: 19.8 % — ABNORMAL HIGH (ref 11.5–15.5)
WBC: 7.8 10*3/uL (ref 4.0–10.5)

## 2018-04-19 LAB — CREATININE, SERUM
Creatinine, Ser: 1.83 mg/dL — ABNORMAL HIGH (ref 0.44–1.00)
GFR calc Af Amer: 40 mL/min — ABNORMAL LOW (ref >60)
GFR, EST NON AFRICAN AMERICAN: 34 mL/min — AB (ref >60)

## 2018-04-19 LAB — FERRITIN: FERRITIN: 6 ng/mL — AB (ref 11–307)

## 2018-04-19 LAB — FOLATE: Folate: 6.6 ng/mL (ref >5.9)

## 2018-04-19 LAB — TSH: TSH: 0.113 u[IU]/mL — AB (ref 0.350–4.500)

## 2018-04-19 MED ORDER — TERBINAFINE HCL 1 % EX CREA: 1.0000 "application " | TOPICAL_CREAM | Freq: Two times a day (BID) | CUTANEOUS | 1 refills | Status: DC

## 2018-04-19 MED ORDER — KETOROLAC TROMETHAMINE 30 MG/ML IJ SOLN
30.0000 mg | Freq: Four times a day (QID) | INTRAMUSCULAR | Status: DC
  Administered 2018-04-19 – 2018-04-20 (×4): 30 mg via INTRAVENOUS
  Filled 2018-04-19 (×4): qty 1

## 2018-04-19 MED ORDER — OXYCODONE-ACETAMINOPHEN 5-325 MG PO TABS: 1.0000 | ORAL_TABLET | Freq: Four times a day (QID) | ORAL | 0 refills | Status: DC | PRN

## 2018-04-19 MED ORDER — TETRACAINE HCL 0.5 % OP SOLN
1.0000 [drp] | Freq: Once | OPHTHALMIC | Status: AC
  Administered 2018-04-19: 1 [drp] via OPHTHALMIC
  Filled 2018-04-19: qty 4

## 2018-04-19 MED ORDER — ACETAMINOPHEN 650 MG RE SUPP: 650.0000 mg | Freq: Four times a day (QID) | RECTAL | Status: DC | PRN

## 2018-04-19 MED ORDER — OXYCODONE-ACETAMINOPHEN 5-325 MG PO TABS
1.0000 | ORAL_TABLET | Freq: Once | ORAL | Status: AC
Start: 2018-04-19 — End: 2018-04-19
  Administered 2018-04-19: 1 via ORAL
  Filled 2018-04-19: qty 1

## 2018-04-19 MED ORDER — MORPHINE SULFATE (PF) 4 MG/ML IV SOLN
4.0000 mg | Freq: Once | INTRAVENOUS | Status: AC
  Administered 2018-04-19: 4 mg via INTRAVENOUS
  Filled 2018-04-19: qty 1

## 2018-04-19 MED ORDER — FLUORESCEIN SODIUM 1 MG OP STRP
1.0000 | ORAL_STRIP | Freq: Once | OPHTHALMIC | Status: AC
  Administered 2018-04-19: 1 via OPHTHALMIC
  Filled 2018-04-19: qty 1

## 2018-04-19 MED ORDER — OXYCODONE HCL 5 MG PO TABS
10.0000 mg | ORAL_TABLET | ORAL | Status: DC | PRN
  Administered 2018-04-19 – 2018-04-23 (×15): 10 mg via ORAL
  Filled 2018-04-19 (×16): qty 2

## 2018-04-19 MED ORDER — SODIUM CHLORIDE 0.9 % IV SOLN
INTRAVENOUS | Status: DC
  Administered 2018-04-19 – 2018-04-21 (×3): via INTRAVENOUS
  Administered 2018-04-21: 1000 mL via INTRAVENOUS
  Administered 2018-04-21 – 2018-04-22 (×4): via INTRAVENOUS

## 2018-04-19 MED ORDER — ACETAMINOPHEN 325 MG PO TABS
650.0000 mg | ORAL_TABLET | Freq: Four times a day (QID) | ORAL | Status: DC | PRN
  Administered 2018-04-19 – 2018-04-21 (×3): 650 mg via ORAL
  Filled 2018-04-19 (×4): qty 2

## 2018-04-19 MED ORDER — OXYCODONE-ACETAMINOPHEN 5-325 MG PO TABS
1.0000 | ORAL_TABLET | Freq: Once | ORAL | Status: AC
  Administered 2018-04-19: 1 via ORAL
  Filled 2018-04-19: qty 1

## 2018-04-19 MED ORDER — MORPHINE SULFATE (PF) 2 MG/ML IV SOLN
2.0000 mg | INTRAVENOUS | Status: DC | PRN
  Administered 2018-04-20 (×2): 2 mg via INTRAVENOUS
  Filled 2018-04-19 (×2): qty 1

## 2018-04-19 MED ORDER — SODIUM CHLORIDE 0.9 % IV BOLUS
500.0000 mL | Freq: Once | INTRAVENOUS | Status: AC
  Administered 2018-04-19: 500 mL via INTRAVENOUS

## 2018-04-19 MED ORDER — PRO-STAT SUGAR FREE PO LIQD
30.0000 mL | Freq: Two times a day (BID) | ORAL | Status: DC
  Administered 2018-04-19 – 2018-04-20 (×2): 30 mL via ORAL
  Filled 2018-04-19 (×3): qty 30

## 2018-04-19 MED ORDER — HEPARIN SODIUM (PORCINE) 5000 UNIT/ML IJ SOLN
5000.0000 [IU] | Freq: Three times a day (TID) | INTRAMUSCULAR | Status: DC
  Administered 2018-04-19 – 2018-04-23 (×12): 5000 [IU] via SUBCUTANEOUS
  Filled 2018-04-19 (×12): qty 1

## 2018-04-19 MED ORDER — ERYTHROMYCIN 5 MG/GM OP OINT: 1.0000 "application " | TOPICAL_OINTMENT | Freq: Four times a day (QID) | OPHTHALMIC | 1 refills | Status: DC

## 2018-04-19 NOTE — ED Notes (Signed)
ED TO INPATIENT HANDOFF REPORT  Name/Age/Gender Dominique [name] 37 y.o. female  Code Status Code Status History    Date Active Date Inactive Code Status Order ID Comments User Context   01/06/2017 0612 01/07/2017 0325 Full Code [id number]  Norval Morton, MD ED   12/25/2016 1257 12/29/2016 1749 Full Code [id number]  Debbe Odea, MD Inpatient   12/25/2016 0910 12/25/2016 1257 Full Code [id number]  Debbe Odea, MD ED   07/21/2015 0935 07/25/2015 2218 Full Code [id number]  Samella Parr, NP Inpatient   06/26/2015 1759 07/21/2015 0935 Full Code [id number]  Cathlyn Parsons, PA-C Inpatient   06/04/2015 0757 06/26/2015 1759 Full Code [id number]  Gennaro Africa, MD Inpatient   11/16/2014 2238 11/23/2014 1747 Full Code [id number]  Berle Mull, MD ED      Home/SNF/Other Home  Chief Complaint Hip Pain/Eye pain  Level of Care/Admitting Diagnosis ED Disposition    ED Disposition Condition [facility] Area: [facility] [[id number]]  Level of Care: Med-Surg [16]  Diagnosis: Wound infection [701779]  Admitting Physician: [place], [place]  Attending Physician: Debbe Odea [[id number]]  Estimated length of stay: past midnight tomorrow  Certification:: I certify this patient will need inpatient services for at least 2 midnights  PT Class (Do Not Modify): Inpatient [101]  PT Acc Code (Do Not Modify): Private [1]       Medical History Past Medical History:  Diagnosis Date  . Acute acalculous cholecystitis 11/16/2014  . Anal fistula   . Anemia   . Anxiety   . Asthma   . CHF (congestive heart failure) ([place])   . Complication of anesthesia    states she had a problem staying awake after her c-section, was [facility] to [facility]  . Coronary artery disease   . Diverticulitis of colon 11/17/2014  . Headache    used to have migraines  . Hepatic steatosis 11/17/2014  . Herpes   . Hypertension   . Morbid obesity ([place]) 03/22/2009   Qualifier: Diagnosis of  By: Ronnald Ramp  CMA, Chemira    . Neuropathy   . OSA (obstructive sleep apnea) 05/02/2014  . Seasonal allergies 06/13/2014  . Sickle cell anemia ([place])    She states she " has the trait" (07/20/15)  . Sleep apnea    uses Bipap  . Thyromegaly 05/02/2014    Allergies Allergies  Allergen Reactions  . Other Shortness Of Breath and Swelling    Tree nuts  . Penicillins Other (See Comments)    Unknown childhood allergy Has patient had a PCN reaction causing immediate rash, facial/tongue/throat swelling, SOB or lightheadedness with hypotension: Unknown Has patient had a PCN reaction causing severe rash involving mucus membranes or skin necrosis: Unknown Has patient had a PCN reaction that required hospitalization: Unknown Has patient had a PCN reaction occurring within the last 10 years: Unknown If all of the above answers are "NO", then may proceed with Cephalosporin use.     IV Location/Drains/Wounds Patient Lines/Drains/Airways Status   Active Line/Drains/Airways    Name:   Placement date:   Placement time:   Site:   Days:   Colostomy LUQ   06/11/15    1700    LUQ   3903   Urethral Catheter Robbin Loughmiller, RN Straight-tip 16 Fr.   04/19/18    1215    Straight-tip   less than 1   Incision (Closed) 06/11/15 Abdomen Other (Comment)   06/11/15    1515     1043  Labs/Imaging No results found for this or any previous visit (from the past 48 hour(s)). No results found.  Pending Labs Unresulted Labs (From admission, onward)   None      Vitals/Pain Today's Vitals   04/19/18 1136 04/19/18 1140 04/19/18 1140 04/19/18 1225  BP:   125/81   Pulse:   (!) 106   Resp:   20   Temp:      TempSrc:   Rectal   SpO2:   94%   Weight:      Height:      PainSc: 10-Worst pain ever 10-Worst pain ever  9     Isolation Precautions No active isolations  Medications Medications  ketorolac (TORADOL) 30 MG/ML injection 30 mg (has no administration in time range)  oxyCODONE (Oxy IR/ROXICODONE) immediate  release tablet 10 mg (has no administration in time range)  morphine 2 MG/ML injection 2 mg (has no administration in time range)  fluorescein ophthalmic strip 1 strip (1 strip Both Eyes Given by Other 04/19/18 0230)  tetracaine (PONTOCAINE) 0.5 % ophthalmic solution 1 drop (1 drop Both Eyes Given by Other 04/19/18 0230)  oxyCODONE-acetaminophen (PERCOCET/ROXICET) 5-325 MG per tablet 1 tablet (1 tablet Oral Given 04/19/18 0249)  oxyCODONE-acetaminophen (PERCOCET/ROXICET) 5-325 MG per tablet 1 tablet (1 tablet Oral Given 04/19/18 0655)  morphine 4 MG/ML injection 4 mg (4 mg Intravenous Given 04/19/18 1137)  morphine 4 MG/ML injection 4 mg (4 mg Intravenous Given 04/19/18 1226)    Mobility walks

## 2018-04-19 NOTE — ED Notes (Signed)
Visual acuity screening completed, left eye has 20/70 vision. Pt. Stated that she dont see any letter on her right eye. No contacts, not wearing eye glasses.

## 2018-04-19 NOTE — Care Management Note (Addendum)
Case Management Note  Patient Details  Name: Dominique [name] MRN: [id number] Date of Birth: [birth year]  Subjective/Objective:       Draining wounds  Action/Plan: NCM spoke to pt and offered choice for HH/list provided. Pt states she had [facility] in the past. States she lives at home with her father and 38 year old daughter. Contacted attending for [facility] RN wound care orders. Pt has a follow up appt at wound care clinic on 8/6, will need specific orders for dressing changes if dc home with [facility]. Attending wants pt screened for SNF, CSW referral for SNF placement. Will need PT/OT recommendation.   Expected Discharge Date:                  Expected Discharge Plan:  [place]  In-House Referral:  Clinical Social Work  Discharge planning Services  CM Consult  Post Acute Care Choice:  Home Health Choice offered to:  Patient  DME Arranged:  N/A DME Agency:  NA  HH Arranged:  RN, PT, OT, Nurse's Aide [place] Agency:  [place]  Status of Service:  Completed, signed off  If discussed at [place] of Stay Meetings, dates discussed:    Additional Comments:  Erenest Rasher, RN 04/19/2018, 10:35 AM

## 2018-04-19 NOTE — ED Provider Notes (Signed)
[facility] DEPT Provider Note   CSN: [id number] Arrival date & time: 04/18/18  1919    History   Chief Complaint Chief Complaint  Patient presents with  . Hip Pain    HPI Dominique [name] is a 37 y.o. female.  37 year old female with a history of CHF, CAD, hypertension, morbid obesity, sickle cell anemia, Crohn's s/p laparotomy and colostomy since 2016 presents to the ED for worsening pain. Patient reports pain at the site of multiple inguinal fissures as well as her lower pannus.  Wounds present for at least 4 months. She has been experiencing ongoing drainage which is purulent in nature. The patient has tried keeping the area clean with warm soap and water without relief. She was previously on a course of oral Diflucan and doxycycline. Reports no improvement with these antibiotics. She has tried NSAIDs and Tramadol for pain, but states that it remains severe. It is aggravated with any movement, making ADLs very difficulty for her; notes that she just lays in bed most of the day. Patient denies fevers. Has plans for wound care f/u in 2 weeks, but does not feel she can wait this long. Is tearful throughout assessment.   The history is provided by the patient. No language interpreter was used.  Hip Pain     Past Medical History:  Diagnosis Date  . Acute acalculous cholecystitis 11/16/2014  . Anal fistula   . Anemia   . Anxiety   . Asthma   . CHF (congestive heart failure) ([place])   . Complication of anesthesia    states she had a problem staying awake after her c-section, was transferred from [facility] to [facility]  . Coronary artery disease   . Diverticulitis of colon 11/17/2014  . Headache    used to have migraines  . Hepatic steatosis 11/17/2014  . Herpes   . Hypertension   . Morbid obesity ([place]) 03/22/2009   Qualifier: Diagnosis of  By: Ronnald Ramp CMA, [place]    . Neuropathy   . OSA (obstructive sleep apnea) 05/02/2014  . Seasonal allergies 06/13/2014  .  Sickle cell anemia ([place])    She states she " has the trait" (07/20/15)  . Sleep apnea    uses Bipap  . Thyromegaly 05/02/2014    Patient Active Problem List   Diagnosis Date Noted  . 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 Colostomy in place [facility]) 01/06/2017  . 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 Infliximab (Remicade) long-term use 01/06/2017  . Crohn's colitis, with abscess ([place]) 01/06/2017  . Abdominal abscess 12/25/2016  . Chronic anemia 12/25/2016  . Hypokalemia 07/21/2015  . Adjustment disorder with mixed anxiety and depressed mood   . Physical deconditioning 06/26/2015  . Severe left ventricular systolic dysfunction 20/25/4270  . Pulmonary hypertension ([place])   . Crohn's colitis with perforation s/p left colectomy/colostomy 2016   . OSA on CPAP   . Obesity hypoventilation syndrome ([place])   . Pressure ulcer 06/16/2015  . Protein-calorie malnutrition, severe ([place]) 06/10/2015  . Gastritis 04/07/2015  . Iron deficiency anemia 04/07/2015  . Sinus tachycardia ([place]) 12/29/2014  . Lumbar back pain 12/06/2014  . Rectal bleeding 11/19/2014  . Diverticulitis of colon 11/17/2014  . Hepatic steatosis 11/17/2014  . Seasonal allergies 06/13/2014  . Thyromegaly 05/02/2014  . AMENORRHEA 02/07/2010  . Morbid obesity- BMI 67.5 03/22/2009  . Sickle cell trait ([place]) 10/20/2007    Past Surgical History:  Procedure Laterality Date  . c section 2011  2011  .  COLONOSCOPY WITH PROPOFOL N/A 12/27/2015   Procedure: COLONOSCOPY WITH PROPOFOL;  Surgeon: Wonda Horner, MD;  Location: [facility] ENDOSCOPY;  Service: Endoscopy;  Laterality: N/A;  . COLOSTOMY N/A 06/11/2015   Procedure:  CREATION OF COLOSTOMY;  Surgeon: Georganna Skeans, MD;  Location: [facility];  Service: General;  Laterality: N/A;  . ESOPHAGOGASTRODUODENOSCOPY N/A 07/19/2015   Procedure: ESOPHAGOGASTRODUODENOSCOPY (EGD);  Surgeon: Clarene Essex, MD;  Location: [facility] ENDOSCOPY;  Service: Endoscopy;   Laterality: N/A;  . FLEXIBLE SIGMOIDOSCOPY N/A 06/05/2015   Procedure: FLEXIBLE SIGMOIDOSCOPY;  Surgeon: Ronald Lobo, MD;  Location: [facility] ENDOSCOPY;  Service: Endoscopy;  Laterality: N/A;  . LAPAROTOMY N/A 06/09/2015   Procedure: EXPLORATORY LAPAROTOMY, DRAINAGE OF INTRAABDOMINAL ABSCESS, PARTIAL COLON RESECTION,  APPLICATION OF WOUND VAC;  Surgeon: Georganna Skeans, MD;  Location: [place];  Service: General;  Laterality: N/A;  . LAPAROTOMY N/A 06/11/2015   Procedure: RE-EXPLORATION OF ABDOMEN;  Surgeon: Georganna Skeans, MD;  Location: [place];  Service: General;  Laterality: N/A;     OB History   None      Home Medications    Prior to Admission medications   Medication Sig Start Date End Date Taking? Authorizing Provider  acetaminophen (TYLENOL) 500 MG tablet Take 1,000 mg by mouth every 6 (six) hours as needed for mild pain.   Yes [provider]  doxycycline (VIBRAMYCIN) 100 MG capsule Take 1 capsule (100 mg total) by mouth 2 (two) times daily. 2/95/62  Yes Delora Fuel, MD  ibuprofen (ADVIL,MOTRIN) 200 MG tablet Take 200 mg by mouth every 6 (six) hours as needed for headache or mild pain.   Yes [provider]  PROAIR HFA 108 (90 Base) MCG/ACT inhaler Inhale 2 puffs into the lungs 4 (four) times daily as needed for wheezing or shortness of breath.  12/07/17  Yes [provider]  traMADol (ULTRAM) 50 MG tablet Take 1 tablet (50 mg total) by mouth every 6 (six) hours as needed. 10/22/84  Yes Delora Fuel, MD  erythromycin ophthalmic ointment Place 1 application into both eyes every 6 (six) hours. Place 1/2 inch ribbon of ointment in the affected eye 4 times a day 04/19/18   Antonietta Breach, PA-C  fluconazole (DIFLUCAN) 100 MG tablet Take 2 tablets (200 mg total) by mouth daily. Patient not taking: Reported on 5/78/4696 2/95/28   Delora Fuel, MD  metoCLOPramide (REGLAN) 10 MG tablet Take 1 tablet (10 mg total) by mouth every 6 (six) hours as needed for nausea (or  headache). Patient not taking: Reported on 01/04/2439 09/24/70   Delora Fuel, MD  oxyCODONE-acetaminophen (PERCOCET/ROXICET) 5-325 MG tablet Take 1 tablet by mouth every 6 (six) hours as needed for severe pain. 04/19/18   Antonietta Breach, PA-C  terbinafine (LAMISIL AT) 1 % cream Apply 1 application topically 2 (two) times daily. 04/19/18   Antonietta Breach, PA-C    Family History Family History  Problem Relation Age of Onset  . Hypertension Mother   . Allergies Mother   . Hypertension Father   . Cancer Paternal Grandmother        lung  . Hypertension Sister   . Asthma Brother   . Diabetes Maternal Grandmother   . Hypertension Maternal Grandmother   . Hypertension Maternal Grandfather   . Emphysema Paternal Grandfather     Social History Social History   Tobacco Use  . Smoking status: Current Some Day Smoker    Packs/day: 0.50    Years: 4.00    Pack years: 2.00    Types: Cigarettes  Start date: 07/24/2014    Last attempt to quit: 05/27/2015    Years since quitting: 2.8  . Smokeless tobacco: Never Used  Substance Use Topics  . Alcohol use: No    Alcohol/week: 0.0 oz  . Drug use: No     Allergies   Other and Penicillins   Review of Systems Review of Systems Ten systems reviewed and are negative for acute change, except as noted in the HPI.    Physical Exam Updated Vital Signs BP (!) 125/59 (BP Location: Right Arm)   Pulse (!) 111   Temp 97.9 F (36.6 C) (Oral)   Resp 17   Ht 5' 2"  (1.575 m)   Wt (!) 163.3 kg (360 lb)   LMP 03/28/2018 (Approximate) Comment: pt shielded  SpO2 94%   BMI 65.84 kg/m   Physical Exam  Constitutional: She is oriented to person, place, and time. She appears well-developed and well-nourished. No distress.  Supermorbidly obese. Nontoxic.  HENT:  Head: Normocephalic and atraumatic.  Eyes: EOM are normal. Right eye exhibits discharge. Left eye exhibits discharge. Right conjunctiva is injected. Left conjunctiva is injected. No scleral  icterus.  Slit lamp exam:      The right eye shows corneal ulcer and fluorescein uptake.    No proptosis or hyphema. Normal EOMs. No periorbital edema or erythema. Slightly purulent discharge from bilateral eyes. Corneal defect on the right with fluorescein uptake, suspicious for ulcer. No dendritic staining. Negative Seidel's sign. IOP 13 with 90% CI (right eye).  Neck: Normal range of motion.  Cardiovascular: Normal heart sounds and intact distal pulses.  Intermittent tachycardia; stable compared with prior ED visits.  Pulmonary/Chest: Effort normal. No stridor. No respiratory distress.  Respirations even and unlabored  Abdominal: Soft.  Colostomy draining brown stool.  Musculoskeletal: Normal range of motion.  Neurological: She is alert and oriented to person, place, and time. She exhibits normal muscle tone. Coordination normal.  Skin: Skin is warm and dry. She is not diaphoretic. No pallor.  Intertrigo with skin breakdown to the lower pannus and bilateral groin, also with deep skin fissures. There is yellow, purulent discharge with exquisite TTP. No erythema, heat to touch, induration.  Psychiatric: She has a normal mood and affect. Her behavior is normal.  Nursing note and vitals reviewed.    ED Treatments / Results  Labs (all labs ordered are listed, but only abnormal results are displayed) Labs Reviewed - No data to display  EKG None  Radiology No results found.  Procedures Procedures (including critical care time)  Medications Ordered in ED Medications  fluorescein ophthalmic strip 1 strip (1 strip Both Eyes Given by Other 04/19/18 0230)  tetracaine (PONTOCAINE) 0.5 % ophthalmic solution 1 drop (1 drop Both Eyes Given by Other 04/19/18 0230)  oxyCODONE-acetaminophen (PERCOCET/ROXICET) 5-325 MG per tablet 1 tablet (1 tablet Oral Given 04/19/18 0249)  oxyCODONE-acetaminophen (PERCOCET/ROXICET) 5-325 MG per tablet 1 tablet (1 tablet Oral Given 04/19/18 0932)     Initial  Impression / Assessment and Plan / ED Course  I have reviewed the triage vital signs and the nursing notes.  Pertinent labs & imaging results that were available during my care of the patient were reviewed by me and considered in my medical decision making (see chart for details).     37 y/o female with multiple comorbidities presents for worsening pain associated with chronic wounds.  Patient with plans for outpatient wound care follow-up, but this is not for an additional 2 weeks.  She has no  evidence of secondary infection.  No fevers.  She has had some improvement in her pain with oxycodone given in the emergency department.  Has previously tried NSAIDs as well as tramadol.  There is, ultimately, concern over the trajectory of this patient's disease course.  She struggles with super morbid obesity which makes ADLs difficult.  Ability to complete ADLs has further declined due to persistent pain at sites of her chronic wounds.  It is also quite difficult for the patient to keep these areas clean given her obesity.  I believe she requires more extensive and consistent wound care with dressing changes.  The patient is very tearful.  She feels helpless over continued attempts for outpatient management, only with further worsening of her wounds and associated pain.  I believe she may benefit from continued care in a SNF; patient seems open to this.  She may also be able to achieve some improvement with home health.    Plan for consultation to SW/CM/PT/OT. Patient understands that these consultations do not guarantee her placement in a facility. She verbalizes understanding that she does not meet criteria for admission.  Patient signed out to Nuala Alpha, PA-C at change of shift pending consultations. Will also require outpatient ophthalmology follow up for conjunctivitis and suspected corneal ulceration; referral provided.   Final Clinical Impressions(s) / ED Diagnoses   Final diagnoses:   Fissured skin  Candidal intertrigo  Conjunctivitis of both eyes, unspecified conjunctivitis type  Corneal defect, right    ED Discharge Orders        Ordered    terbinafine (LAMISIL AT) 1 % cream  2 times daily     04/19/18 0617    oxyCODONE-acetaminophen (PERCOCET/ROXICET) 5-325 MG tablet  Every 6 hours PRN     04/19/18 0617    erythromycin ophthalmic ointment  Every 6 hours     04/19/18 0620       Antonietta Breach, PA-C 04/19/18 0720    Molpus, Jenny Reichmann, MD 04/19/18 [phone]

## 2018-04-19 NOTE — ED Provider Notes (Signed)
I have received handoff from Antonietta Breach PA-C regarding this patient.  During my examination patient is tearful, pain 10/10 severity, patient with multiple purulent draining wounds to her groin in pannus area.  Patient has large body habitus, is unable to reach these areas on her own.  I am concerned of the patient's ability to take care of herself at discharge.  I have uploaded pictures of patient's wounds below to show hospitalist.  My plan is to talk to the hospitalist for inpatient treatment of her wounds.  Pictures below are after wound care performed by nursing staff, with pus removed.        Of note patient with additional similar lesion to her gluteal area not pictured.  Patient is tachycardic in department, I have requested nursing staff perform a rectal temp.  I have been informed that temperature was 99 F.  I am concerned of the patient's ability to perform ADLs and wound care.  I have consulted hospitalist, Dr. Wynelle Cleveland who has accepted the patient for wound infection.  Case also discussed with my attending physician, Dr. Eulis Foster who agrees with plan of care, admission to hospital for possible wound infection.     Gari Crown 04/19/18 1339    Daleen Bo, MD 04/19/18 [phone]

## 2018-04-19 NOTE — Progress Notes (Signed)
OT Note  Patient Details Name: Dominique [name] MRN: [id number] DOB: September 10, 1981   Cancelled Treatment: Reason Eval/Treat Not completed: Pain limiting ability to participate. Attempted OT eval in ED. Pt unable to participate in activity at this time due to pain. Will check back another day.  Pauline Aus OTR/L 04/19/2018

## 2018-04-19 NOTE — Progress Notes (Signed)
PT Cancellation Note  Patient Details Name: Dominique [name] MRN: [id number] DOB: 04-29-1981   Cancelled Treatment:    Reason Eval/Treat Not Completed: Pain limiting ability to participate. Attempted PT eval in ED. Pt unable to participate at this time due to pain. PT will check back another day.    Weston Anna, MPT Pager: [phone]

## 2018-04-19 NOTE — Progress Notes (Addendum)
Skin assessment completed. Skin warm and dry. Open wounds (non-pressure) noted generally in groin area and on abd. Rt and lt groin open w/bleeding noted as well as two open areas under pannus. Wet to dry dressings in place per ED RN. Multiple tiny circular breaks in bunches noted on inner thighs bilaterally near groin wounds. Small, open red area on anterior pubic area w/no drainage noted. Abdominal midline, old scarred incision pink with breaks in skin branching horizontally to lt and rt of area- 2 open areas to rt mid abd and 1 open area lt mid abd- all breaks oozing small amount of yellow drainage. Abdominal area cleansed with NS and wet to dry dressings done. Lastly, skin breakage noted in sacral-coccyx cleft.

## 2018-04-19 NOTE — Progress Notes (Signed)
CSW aware of social work consult. Attempted to meet with patient, knocked several times and addressed patient by name, patient appeared to be sleeping. CSW will follow up with patient.   Stephanie Acre, [place] Social Worker [phone]

## 2018-04-19 NOTE — ED Notes (Signed)
Patient wound clean and wet dressing applied.

## 2018-04-19 NOTE — H&P (Signed)
History and Physical    Dominique [name]  WRU:[id number]  DOB: Mar 08, 1981  DOA: 04/19/2018 PCP: Benito Mccreedy, MD   Patient coming from: home  Chief Complaint: ulcers and pain  HPI: Dominique [name] is a 37 y.o. female with medical history of Crohn's disease s/p colectomy and colostomy, sickle cell trait, Morbid obesity and hepatic steatosis who has had ulcers in her groin since March. She states these ulcers have gotten much worse and have been draining for the past 2 wks. Pain has been severe and she has mostly laid in the bed for the past 2 wks. She has tried the following: Doxycycline, Diflucan and Tramadol but still is having drainage. She only had 15 Tramadol tabs and since they have run out she has been taking Tylenol and Motrin for the pain.  She has been applying Vaseline and Lidocaine and keeping the ulcers covered with toilet paper and tissues due to the excess drainage. She is currently crying and asking for more pain medications.  She also has an ulcer in her gluteal cleft and ulcers in the skin under her pannus.  She has not had any fevers or chills.   ED Course: given a number of pain meds including Morphine and Percocets  Review of Systems:  All other systems reviewed and apart from HPI, are negative.  Past Medical History:  Diagnosis Date  . Acute acalculous cholecystitis 11/16/2014  . Anal fistula   . Anemia   . Anxiety   . Asthma   . CHF (congestive heart failure) ([place])   . Complication of anesthesia    states she had a problem staying awake after her c-section, was transferred from [facility] to [facility]  . Coronary artery disease   . Diverticulitis of colon 11/17/2014  . Headache    used to have migraines  . Hepatic steatosis 11/17/2014  . Herpes   . Hypertension   . Morbid obesity ([place]) 03/22/2009   Qualifier: Diagnosis of  By: Ronnald Ramp CMA, [place]    . Neuropathy   . OSA (obstructive sleep apnea) 05/02/2014  . Seasonal allergies 06/13/2014  . Sickle cell anemia ([place])      She states she " has the trait" (07/20/15)  . Sleep apnea    uses Bipap  . Thyromegaly 05/02/2014    Past Surgical History:  Procedure Laterality Date  . c section 2011  2011  . COLONOSCOPY WITH PROPOFOL N/A 12/27/2015   Procedure: COLONOSCOPY WITH PROPOFOL;  Surgeon: Wonda Horner, MD;  Location: [facility] ENDOSCOPY;  Service: Endoscopy;  Laterality: N/A;  . COLOSTOMY N/A 06/11/2015   Procedure:  CREATION OF COLOSTOMY;  Surgeon: Georganna Skeans, MD;  Location: [place];  Service: General;  Laterality: N/A;  . ESOPHAGOGASTRODUODENOSCOPY N/A 07/19/2015   Procedure: ESOPHAGOGASTRODUODENOSCOPY (EGD);  Surgeon: Clarene Essex, MD;  Location: [facility] ENDOSCOPY;  Service: Endoscopy;  Laterality: N/A;  . FLEXIBLE SIGMOIDOSCOPY N/A 06/05/2015   Procedure: FLEXIBLE SIGMOIDOSCOPY;  Surgeon: Ronald Lobo, MD;  Location: [facility] ENDOSCOPY;  Service: Endoscopy;  Laterality: N/A;  . LAPAROTOMY N/A 06/09/2015   Procedure: EXPLORATORY LAPAROTOMY, DRAINAGE OF INTRAABDOMINAL ABSCESS, PARTIAL COLON RESECTION,  APPLICATION OF WOUND VAC;  Surgeon: Georganna Skeans, MD;  Location: [place];  Service: General;  Laterality: N/A;  . LAPAROTOMY N/A 06/11/2015   Procedure: RE-EXPLORATION OF ABDOMEN;  Surgeon: Georganna Skeans, MD;  Location: [place];  Service: General;  Laterality: N/A;    Social History:  States she does not smoke or drink or use any illegal drugs.   Allergies  Allergen  Reactions  . Other Shortness Of Breath and Swelling    Tree nuts  . Penicillins Other (See Comments)    Unknown childhood allergy Has patient had a PCN reaction causing immediate rash, facial/tongue/throat swelling, SOB or lightheadedness with hypotension: Unknown Has patient had a PCN reaction causing severe rash involving mucus membranes or skin necrosis: Unknown Has patient had a PCN reaction that required hospitalization: Unknown Has patient had a PCN reaction occurring within the last 10 years: Unknown If all of the above answers are "NO", then may  proceed with Cephalosporin use.     Family History  Problem Relation Age of Onset  . Hypertension Mother   . Allergies Mother   . Hypertension Father   . Cancer Paternal Grandmother        lung  . Hypertension Sister   . Asthma Brother   . Diabetes Maternal Grandmother   . Hypertension Maternal Grandmother   . Hypertension Maternal Grandfather   . Emphysema Paternal Grandfather      Prior to Admission medications   Medication Sig Start Date End Date Taking? Authorizing Provider  acetaminophen (TYLENOL) 500 MG tablet Take 1,000 mg by mouth every 6 (six) hours as needed for mild pain.   Yes [provider]  doxycycline (VIBRAMYCIN) 100 MG capsule Take 1 capsule (100 mg total) by mouth 2 (two) times daily. 0/17/79  Yes Delora Fuel, MD  ibuprofen (ADVIL,MOTRIN) 200 MG tablet Take 200 mg by mouth every 6 (six) hours as needed for headache or mild pain.   Yes [provider]  PROAIR HFA 108 (90 Base) MCG/ACT inhaler Inhale 2 puffs into the lungs 4 (four) times daily as needed for wheezing or shortness of breath.  12/07/17  Yes [provider]  traMADol (ULTRAM) 50 MG tablet Take 1 tablet (50 mg total) by mouth every 6 (six) hours as needed. 3/90/30  Yes Delora Fuel, MD  erythromycin ophthalmic ointment Place 1 application into both eyes every 6 (six) hours. Place 1/2 inch ribbon of ointment in the affected eye 4 times a day 04/19/18   Antonietta Breach, PA-C  fluconazole (DIFLUCAN) 100 MG tablet Take 2 tablets (200 mg total) by mouth daily. Patient not taking: Reported on 0/92/3300 7/62/26   Delora Fuel, MD  metoCLOPramide (REGLAN) 10 MG tablet Take 1 tablet (10 mg total) by mouth every 6 (six) hours as needed for nausea (or headache). Patient not taking: Reported on 3/33/5456 2/56/38   Delora Fuel, MD  oxyCODONE-acetaminophen (PERCOCET/ROXICET) 5-325 MG tablet Take 1 tablet by mouth every 6 (six) hours as needed for severe pain. 04/19/18   Antonietta Breach, PA-C    terbinafine (LAMISIL AT) 1 % cream Apply 1 application topically 2 (two) times daily. 04/19/18   Antonietta Breach, PA-C    Physical Exam: Wt Readings from Last 3 Encounters:  04/18/18 (!) 163.3 kg (360 lb)  04/04/18 (!) 165.1 kg (364 lb)  02/05/18 (!) 166.5 kg (367 lb)   Vitals:   04/19/18 0515 04/19/18 0600 04/19/18 0654 04/19/18 1140  BP:  (!) 125/59 (!) 125/59 125/81  Pulse: 98 97 (!) 111 (!) 106  Resp:   17 20  Temp:      TempSrc:    Rectal  SpO2: 100% 100% 94% 94%  Weight:      Height:          Constitutional:  Uncomfortable, tearful, mobidly obese Eyes: PERRLA, lids and conjunctivae normal ENT:  Mucous membranes are moist.  Pharynx clear of exudate  Normal dentition.  Neck: Supple, no masses  Respiratory:  Clear to auscultation bilaterally  Normal respiratory effort.  Cardiovascular:  S1 & S2 heard, regular rate and rhythm No Murmurs Abdomen:  Non distended No tenderness, No masses - colostomy bag with brown liquid stool noted  Bowel sounds normal Extremities:  No clubbing / cyanosis No pedal edema No joint deformity    Skin:  Begins to cry when I try to uncover her ulcers and so I have only looked at the pictures provided in [facility] note. I did see her abdominal ulcers- vertical ulcer on her abdomen noted with moisture in it but no obvious pus, small ulcers about 2 cm across on underside of the pannus Neurologic:  AAO x 3 CN 2-12 grossly intact Sensation intact Strength 5/5 in all 4 extremities Psychiatric:  Deperssed    Labs on Admission: I have personally reviewed following labs and imaging studies  CBC: No results for input(s): WBC, NEUTROABS, HGB, HCT, MCV, PLT in the last 168 hours. Basic Metabolic Panel: No results for input(s): NA, K, CL, CO2, GLUCOSE, BUN, CREATININE, CALCIUM, MG, PHOS in the last 168 hours. GFR: Estimated Creatinine Clearance: 111.5 mL/min (A) (by C-G formula based on SCr of 1.04 mg/dL (H)). Liver  Function Tests: No results for input(s): AST, ALT, ALKPHOS, BILITOT, PROT, ALBUMIN in the last 168 hours. No results for input(s): LIPASE, AMYLASE in the last 168 hours. No results for input(s): AMMONIA in the last 168 hours. Coagulation Profile: No results for input(s): INR, PROTIME in the last 168 hours. Cardiac Enzymes: No results for input(s): CKTOTAL, CKMB, CKMBINDEX, TROPONINI in the last 168 hours. BNP (last 3 results) No results for input(s): PROBNP in the last 8760 hours. HbA1C: No results for input(s): HGBA1C in the last 72 hours. CBG: No results for input(s): GLUCAP in the last 168 hours. Lipid Profile: No results for input(s): CHOL, HDL, LDLCALC, TRIG, CHOLHDL, LDLDIRECT in the last 72 hours. Thyroid Function Tests: No results for input(s): TSH, T4TOTAL, FREET4, T3FREE, THYROIDAB in the last 72 hours. Anemia Panel: No results for input(s): VITAMINB12, FOLATE, FERRITIN, TIBC, IRON, RETICCTPCT in the last 72 hours. Urine analysis:    Component Value Date/Time   COLORURINE RED (A) 01/06/2017 0143   APPEARANCEUR HAZY (A) 01/06/2017 0143   LABSPEC 1.017 01/06/2017 0143   PHURINE 5.0 01/06/2017 0143   GLUCOSEU NEGATIVE 01/06/2017 0143   HGBUR LARGE (A) 01/06/2017 0143   BILIRUBINUR NEGATIVE 01/06/2017 0143   KETONESUR NEGATIVE 01/06/2017 0143   PROTEINUR 100 (A) 01/06/2017 0143   UROBILINOGEN 0.2 07/22/2015 0405   NITRITE NEGATIVE 01/06/2017 0143   LEUKOCYTESUR TRACE (A) 01/06/2017 0143   Sepsis Labs: @LABRCNTIP (procalcitonin:4,lacticidven:4) )No results found for this or any previous visit (from the past 240 hour(s)).   Radiological Exams on Admission: No results found.  EKG: Independently reviewed. Sinus rhythm on EKG on 7/13  Assessment/Plan Principal Problem:   Multiple wounds of skin - in groins, pannus/ abdominal wall and buttocks - her pain is quite intense and there has not been adequate wound care at home - I am not fully sure if there is an infection  - per the pictures, there is no discharged, no surrounding cellulitis- no leukocytosis, fevers or chills and thus I will not start an antibiotics - wound care RN will be available tomorrow - patient has not been walking due to the pain from the ulcers and she will need PT after adequate pain control - have prescribed, Toradol routine, Oxycodone 10  mg Every 4 hrs PRN and Morphine for breakthrough  Active Problems:   Morbid obesity  Body mass index is 65.84 kg/m. - needs a good weight loss plan to be devised between her, her physician and dieticians - for now will place on protein supplements for wound healing and a low fat and a carb modified diet (to help her lose weight) and obtain an nutrition consult - check TSH, Free T4 and A1c    Hepatic steatosis - due to above    Crohn's colitis with perforation s/p left colectomy/colostomy 2016 - changes her bag every 4 days    OSA on CPAP -will order C pap  Mildly elevated Cr and low CO2 - will give slow IVF - she has been taking Motrin and in order to reduce her Narcotic intake I have had to start Toradol but if Cr worsens tomorrow, Toradol will have to be stopped - an order for a foley has been placed in the ED which I agree with for now due to the pain in her wounds- will will follow I and O    Adjustment disorder with mixed anxiety and depressed mood - I don't see she is on any medication but her current issue with the wounds is likely impacting her mood    Sickle cell trait / anemia - obtain anemia panel  Thrombocytosis - follow? Iron deficiency      DVT prophylaxis: Heparin Code Status: Full code  Family Communication:   Disposition Plan: med/surg- f/u wound care advice, PT eval  Consults called: none  Admission status: inpateint    Debbe Odea MD [facility] Pager: [email or web address] Password [email or web address] 7PM-7AM, please contact night-coverage   04/19/2018, 12:45 PM

## 2018-04-20 DIAGNOSIS — N179 Acute kidney failure, unspecified: Secondary | ICD-10-CM

## 2018-04-20 DIAGNOSIS — N289 Disorder of kidney and ureter, unspecified: Secondary | ICD-10-CM | POA: Diagnosis present

## 2018-04-20 DIAGNOSIS — R7989 Other specified abnormal findings of blood chemistry: Secondary | ICD-10-CM | POA: Diagnosis present

## 2018-04-20 DIAGNOSIS — H5789 Other specified disorders of eye and adnexa: Secondary | ICD-10-CM

## 2018-04-20 DIAGNOSIS — H5711 Ocular pain, right eye: Secondary | ICD-10-CM

## 2018-04-20 LAB — CBC
HCT: 26.6 % — ABNORMAL LOW (ref 36.0–46.0)
HEMOGLOBIN: 8.1 g/dL — AB (ref 12.0–15.0)
MCH: 20.9 pg — ABNORMAL LOW (ref 26.0–34.0)
MCHC: 30.5 g/dL (ref 30.0–36.0)
MCV: 68.6 fL — ABNORMAL LOW (ref 78.0–100.0)
PLATELETS: 348 10*3/uL (ref 150–400)
RBC: 3.88 MIL/uL (ref 3.87–5.11)
RDW: 19.8 % — ABNORMAL HIGH (ref 11.5–15.5)
WBC: 7.2 10*3/uL (ref 4.0–10.5)

## 2018-04-20 LAB — URINALYSIS, ROUTINE W REFLEX MICROSCOPIC
BILIRUBIN URINE: NEGATIVE
GLUCOSE, UA: NEGATIVE mg/dL
HGB URINE DIPSTICK: NEGATIVE
KETONES UR: NEGATIVE mg/dL
LEUKOCYTES UA: NEGATIVE
Nitrite: NEGATIVE
PH: 5 (ref 5.0–8.0)
PROTEIN: 30 mg/dL — AB
Specific Gravity, Urine: 1.015 (ref 1.005–1.030)

## 2018-04-20 LAB — BASIC METABOLIC PANEL
ANION GAP: 6 (ref 5–15)
BUN: 15 mg/dL (ref 6–20)
CALCIUM: 8.6 mg/dL — AB (ref 8.9–10.3)
CHLORIDE: 114 mmol/L — AB (ref 98–111)
CO2: 15 mmol/L — AB (ref 22–32)
CREATININE: 1.78 mg/dL — AB (ref 0.44–1.00)
GFR calc Af Amer: 41 mL/min — ABNORMAL LOW (ref >60)
GFR calc non Af Amer: 35 mL/min — ABNORMAL LOW (ref >60)
Glucose, Bld: 102 mg/dL — ABNORMAL HIGH (ref 70–99)
Potassium: 4.2 mmol/L (ref 3.5–5.1)
SODIUM: 135 mmol/L (ref 135–145)

## 2018-04-20 LAB — HIV ANTIBODY (ROUTINE TESTING W REFLEX): HIV Screen 4th Generation wRfx: NONREACTIVE

## 2018-04-20 LAB — VITAMIN B12: Vitamin B-12: 395 pg/mL (ref 180–914)

## 2018-04-20 LAB — HEMOGLOBIN A1C
Hgb A1c MFr Bld: 5.3 % (ref 4.8–5.6)
MEAN PLASMA GLUCOSE: 105 mg/dL

## 2018-04-20 MED ORDER — NEOMYCIN-POLYMYXIN-DEXAMETH 3.5-10000-0.1 OP SUSP
1.0000 [drp] | Freq: Every day | OPHTHALMIC | Status: DC
  Administered 2018-04-20 – 2018-04-23 (×15): 1 [drp] via OPHTHALMIC
  Filled 2018-04-20: qty 5

## 2018-04-20 MED ORDER — ENSURE ENLIVE PO LIQD
237.0000 mL | ORAL | Status: DC
  Administered 2018-04-21 – 2018-04-23 (×3): 237 mL via ORAL

## 2018-04-20 MED ORDER — GANCICLOVIR 0.15 % OP GEL
1.0000 [drp] | Freq: Every day | OPHTHALMIC | Status: DC
  Administered 2018-04-21 – 2018-04-23 (×12): 1 [drp] via OPHTHALMIC
  Filled 2018-04-20: qty 5

## 2018-04-20 MED ORDER — GANCICLOVIR 0.15 % OP GEL: 1.0000 [drp] | Freq: Every day | OPHTHALMIC | Status: DC

## 2018-04-20 MED ORDER — PRO-STAT SUGAR FREE PO LIQD
30.0000 mL | Freq: Three times a day (TID) | ORAL | Status: DC
  Administered 2018-04-20 – 2018-04-22 (×8): 30 mL via ORAL
  Filled 2018-04-20 (×8): qty 30

## 2018-04-20 MED ORDER — VALACYCLOVIR HCL 500 MG PO TABS
1000.0000 mg | ORAL_TABLET | Freq: Three times a day (TID) | ORAL | Status: AC
  Administered 2018-04-20: 1000 mg via ORAL
  Filled 2018-04-20: qty 2

## 2018-04-20 NOTE — Evaluation (Signed)
Physical Therapy Evaluation Patient Details Name: Dominique [name] MRN: [id number] DOB: Oct 30, 1980 Today's Date: 04/20/2018   History of Present Illness  37yo female with history of ulcers in groin since March which have gotten worse and started draining in the past 2 weeks and have become very painful. PMH anxiety, asthma, CHF, HTN, morbid obesity, neuropathy, OSA, sickle cell anemia, [place] s/p colectomy and colostomy   Clinical Impression   Patient received in bed, pleasant and willing to participate with PT this morning. She is able to complete functional bed mobility with Min guardx2, but does require Min-ModA x2 to stand with RW due to functional muscle weakness and deconditioning. Able to take pivotal steps to chair, but became dizzy (does have history of motion sickness/vertigo) and returned to sitting position in chair. HR and O2 WNL, however HR varying from 113-130BPM, RN aware and agreeable to patient staying up in chair for now. Patient will strongly benefit from continued skilled PT services and may be appropriate for HHPT as she progresses with skilled PT services in the acute setting. She was left up in the chair with all needs met, RN aware of patient status.     Follow Up Recommendations Home health PT;Other (comment)(may need SNF if does not progress with PT- will continue to follow )    Equipment Recommendations  Rolling walker with 5" wheels;3in1 (PT);Other (comment)(elevated toilet seat; all equipment needs to be bariatric )    Recommendations for Other Services       Precautions / Restrictions Precautions Precautions: Other (comment) Precaution Comments: colostomy bag RUQ  Restrictions Weight Bearing Restrictions: No      Mobility  Bed Mobility Overal bed mobility: Needs Assistance Bed Mobility: Supine to Sit     Supine to sit: Min guard     General bed mobility comments: Min guardx2 for safety   Transfers Overall transfer level: Needs assistance Equipment  used: Rolling walker (2 wheeled) Transfers: Sit to/from Stand Sit to Stand: Mod assist;+2 physical assistance         General transfer comment: assist to come to full upright and gain balance once inside RW   Ambulation/Gait             General Gait Details: DNT, able to take pivotal steps to chair but became dizzy and SOB during transfer   Stairs            Wheelchair Mobility    Modified Rankin (Stroke Patients Only)       Balance Overall balance assessment: Mild deficits observed, not formally tested                                           Pertinent Vitals/Pain Pain Assessment: 0-10 Pain Score: 6  Pain Location: wound sites  Pain Descriptors / Indicators: Aching;Sore Pain Intervention(s): Limited activity within patient's tolerance;Monitored during session;Premedicated before session    Home Living Family/patient expects to be discharged to:: Private residence Living Arrangements: Parent;Children Available Help at Discharge: Family;Available 24 hours/day Type of Home: Apartment Home Access: Stairs to enter Entrance Stairs-Rails: Can reach both Entrance Stairs-Number of Steps: 4  Home Layout: One level Home Equipment: None      Prior Function Level of Independence: Independent               Hand Dominance        Extremity/Trunk Assessment   Upper Extremity Assessment  Upper Extremity Assessment: Defer to OT evaluation    Lower Extremity Assessment Lower Extremity Assessment: Generalized weakness    Cervical / Trunk Assessment Cervical / Trunk Assessment: Normal  Communication   Communication: No difficulties  Cognition Arousal/Alertness: Awake/alert Behavior During Therapy: WFL for tasks assessed/performed Overall Cognitive Status: Within Functional Limits for tasks assessed                                        General Comments      Exercises     Assessment/Plan    PT Assessment  Patient needs continued PT services  PT Problem List Decreased strength;Decreased mobility;Obesity;Decreased activity tolerance;Pain       PT Treatment Interventions DME instruction;Therapeutic activities;Gait training;Therapeutic exercise;Patient/family education;Stair training;Balance training;Functional mobility training;Neuromuscular re-education    PT Goals (Current goals can be found in the Care Plan section)  Acute Rehab PT Goals Patient Stated Goal: reduce pain  PT Goal Formulation: With patient Time For Goal Achievement: 05/04/18 Potential to Achieve Goals: Good    Frequency Min 3X/week   Barriers to discharge        Co-evaluation               AM-PAC PT "6 Clicks" Daily Activity  Outcome Measure Difficulty turning over in bed (including adjusting bedclothes, sheets and blankets)?: A Little Difficulty moving from lying on back to sitting on the side of the bed? : A Little Difficulty sitting down on and standing up from a chair with arms (e.g., wheelchair, bedside commode, etc,.)?: A Lot Help needed moving to and from a bed to chair (including a wheelchair)?: A Little Help needed walking in hospital room?: A Little Help needed climbing 3-5 steps with a railing? : A Lot 6 Click Score: 16    End of Session   Activity Tolerance: Patient tolerated treatment well Patient left: in chair;with call bell/phone within reach Nurse Communication: Mobility status;Other (comment)(HR ) PT Visit Diagnosis: Muscle weakness (generalized) (M62.81);Difficulty in walking, not elsewhere classified (R26.2);Dizziness and giddiness (R42)    Time: 9476-5465 PT Time Calculation (min) (ACUTE ONLY): 24 min   Charges:   PT Evaluation $PT Eval Low Complexity: 1 Low PT Treatments $Therapeutic Activity: 8-22 mins        Deniece Ree PT, DPT, CBIS  Supplemental Physical Therapist [place]   Pager [phone]

## 2018-04-20 NOTE — Progress Notes (Addendum)
Attestation  37 year old female medical history significant for Crohn's disease s/p  colectomy (2016) with colostomy, multiple peri-inguinal and peri-gluteal wounds who presented with worsening pain from above wounds.  She was last seen by her general surgeon on 12/04/2017 for evaluation of open wound and a midline incision scar from prior colectomy surgery.  At that time her inguinal/peri-gluteal wounds were assessed and patient was told to use dry dressings and silver nitrate.  She has been to the ED twice  (7/13 previously).  On last ED visit she was prescribed pain medication, doxycycline Diflucan will defer to wound care.  Patient comes in this time with worsening pain related to wounds, decreased mobility due to said pain.  Denies any fevers chills, purulent drainage, abdominal pain, diarrhea, nausea or vomiting.  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 Since the early part of this month both eyes have turned red.  She's tried allergy medications with no improvement.  She's had right eye pain that she thought was associated with a headache for several weeks.  Her left eye vision has become more blurry this month as well.   Peri inguinal and perigluteal wounds, stable -looks clean, no sepsis - follow wound care recommendations -unclear nidus but her body habitus has limited wound healing -judicious pain control(oxy 10 mg q4H PRN, IV morphine 2 mg q4 Hrs severe/breakthrough pain) - discontinued Toradol given AKI ( though suspect prerenal)  Crohn's disease, s/p colectomy with colostomy (2016), stable -no active signs of intra-intestinal disease -has been off Remicade for several months ( was supposed to resume this year)  AKI, suspect pre-renal etiology -creatinine on admission of 1.8, now 1.78. Hopefully this is peak with improvement in next 24 hours -Suspect prerenal given admitted PO intake - monitor  output via foley - Ua unremarkable - increased IVF rate to 125 cc/hr over next day - repeat BMP in am, if worsens will obtain renal ultrasound and consult nephrology on 7/30  Bilateral eye redness, worsening vision -Last evaluated by Dr. 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 Not concerning for hyperthyrotoxicosis. More consistent with non-thyroidal illness -repeat as outpatient  Physical Exam Gen: lying in bed, comfortably Eyes:       CV: RRR, no murmurs, rubs or gallops, no peripheral edema Abd: soft, non-distended, non-tender, ostomy bag in place with no surrounding erythema or drainage Resp: normal respiratory effort on room air Skin: multiple wounds in bilateral inguinal folds, perianal area ( photos from 7/27 below), dressings in place now look clean, dry, and intact      Psych: normal affect and mood. Tearful and anxious on exam Neuro: alert and orientedx4     I saw and evaluated the patient and agree with the nurse PA-Student's note and plan.    Desiree Hane MD [facility]  Pager [phone]       PROGRESS NOTE    Dominique [name]  ACZ:[id number] DOB: 1980/10/01 DOA: 04/19/2018 PCP: Vista Lawman,  Iona Beard, MD   Outpatient Specialists:   HPI:  37 yo female who presents to the hospital with complaints of persistent, painful skin lesions that have worsened in nature the last couple days. The patient endorses increased pain with walking, defecating, and laying  certain ways in bed. This has impacted her quality life, making it hard for her to navigate her home and she has not been eating or drinking properly. Lives at home with her father and 28-year-old daughter who are not active caregivers to her. This complaint has been an ongoing issue since beginning of March. The patient had a colectomy and colostomy 01/14/17. However, in March, she noticed her wounds reopened so she went to see surgery 12/04/17. During the visit, she discussed a wound on her anal cleft as well. 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 Her physician also recommended she see dermatology; however, the patient's health insurance was denied. After her visit, the patient reports no improvement in her pain. She tried Preparation H with no improvement and endorses finishing the Valacyclovir as prescribed.   The patient then went to the ED 12/12/17 for her anal pain/discomfort, during which the physicians attempted a more aggressive approach. The physicians prescribed her Bacitracin + Percocet/Roxicet + Valacyclovir (x21 days). She endorses completing her medications and using the other medications with no improvement. She revisited the ED 02/05/18 where she was prescribed Lidocaine + Desitin + Sitz baths. She revisited the ED again 04/04/18 when she was prescribed Percocet/Roxicet + Tramadol + Diflucan + Doxycycline and was referred to wound care. The patient was scheduled to see Wound Care outpatient 05/01/18, however her pain became too much to handle. Currently denies any fever/chills.  Brief Narrative:  37 yo female with a PMH significant for CAD, CHF, Crohn's s/p colectomy and colostomy 01/14/18, sickle cell trait with chronic anemia, morbid obesity, and hepatic steatosis is here with complaints of ongoing complications with inguinal skin breaks as well as anal cleft fissures. The patient is afebrile and wounds are showing no signs of infection.  CBC shows anemia to her baseline with no elevated WBCs. Due to new onset left eye blurred vision 2/2 to Crohn's complications and worsening chronic wounds, the patient was admitted for additional monitoring, IV fluids, and wound management. Wound Care is following and recommends absorbant dressings and Silvadene for pain and antimicrobial management.   Other problems: new acute left eye blurred vision. Endorses right eye blurred vision since March. Could be complications 2/2 to her Crohn's Disease. ED did a thorough exam and noted the following: "Right eye shows corneal ulcer and fluorescein uptake. No proptosis or hyphema. Normal EOMs. No periorbital edema or erythema. Slightly purulent discharge from bilateral eyes. Corneal defect on the right with fluorescein uptake, suspicious for ulcer. No dendritic staining. Negative Seidel's sign. IOP 13 with 90% CI (right eye)."    Assessment & Plan:   Principal Problem:   Multiple wounds of skin Active Problems:   Morbid obesity- BMI 67.5   Hepatic steatosis   Crohn's colitis with perforation s/p left colectomy/colostomy 2016   OSA on CPAP   Adjustment disorder with mixed anxiety and depressed mood   Sickle cell trait (HCC)   AKI (acute kidney injury) ([place])  Multiple skin breaks in pubis, inguinal, anal areas: Lesions are superficial, dry with no signs of infection. No discharge, no warmness to touch. The patient's pain level has improved since yesterday. She has been afebrile since admission. Receiving  oxycodone and morphine prn for pain. Wound care has been consulted and has recommended that moist dressings be replaced with absorbant dressings and silvadene be used for pain and antimicrobial properties. The patient would follow up with wound care for her scheduled appointment 05/01/18  Acute Kidney Injury, possibly prerenal: The patient's creatinine is 1.78, which is higher than her baseline. This may be attributed to dehydration due to recent poor  hydration. Fluids increased to 125. UA ordered to rule out other etiology of increased Cr. Repeat BMP ordered. The patient's intake/output will be monitored.   Euthyroid Sick Syndrome: TSH 0.113. Free T4 0.65. Denies any bradycardia or increased lethargy. The patient will need to follow up with her PCP for repeat labs in 6 weeks.  Crohn's Colitis with perforation s/p left colectomy/colostomy: Intraintestinal disease is stable. The patient denies N/V/abdominal pain. Her colostomy bag production has been normal with no acute changes. At this time, her intraintestinal disease is under control. The patient reports never restarting her Remicade. She should follow up with her GI doctor for management.  Bilateral Orbital Conjunctivitis: The patient reports blurred vision in her right eye since March that she has recently noticed is now affecting her left eye as well. Question if it is 2/2 to extraintestinal complications from her Crohn's. The patient should follow up with her GI doctor outpatient.   DVT prophylaxis: Heparin SQ Code Status: Full Family Communication: No family at bedside during interview Disposition Plan: Patient was seen by Wound Care and they recommend her dressings be changed to absorbent dressings and Silvadene be used for pain and antimicrobial management. At this time, the patient has been evaluated by PT who believes she could benefit from in-home aid to help with wound cleaning and dressings. Pending repeat labs to evaluate for improvement of AKI, the patient will continue IV fluids for dehydration and discharge expected to be tomorrow. The patient should follow up with wound care for her scheduled appointment 05/01/18.   Consultants:   Wound Care  Procedures:   None  Antimicrobials:   None   Subjective: The patient was lying supine in the hospital during interview. States her pain is better than yesterday. Worse when palpated.   Objective: Vitals:   04/19/18 1140  04/19/18 1429 04/19/18 2236 04/20/18 0535  BP: 125/81 (!) 145/81 118/63 136/64  Pulse: (!) 106 99 (!) 112 (!) 106  Resp: 20 18 18 18   Temp:  97.9 F (36.6 C) 98.2 F (36.8 C) 98 F (36.7 C)  TempSrc: Rectal Oral Oral Oral  SpO2: 94% 100% 99% 97%  Weight:      Height:        Intake/Output Summary (Last 24 hours) at 04/20/2018 1028 Last data filed at 04/20/2018 7414 Gross per 24 hour  Intake 3208.5 ml  Output 850 ml  Net 2358.5 ml   Filed Weights   04/18/18 2010  Weight: (!) 163.3 kg (360 lb)    Examination:  General exam: Appears calm and comfortable  Skin: bilateral inguinal breaks in the skin without drainage or pus, no signs of infection, and TTP. Multiple small 1 cm lesions on pubis region. 2 anal fissures along cleft - no drainage or pus, no signs of infection, and TTP. Orbitals: Normal EOMs. Bilateral corneal conjunctivitis with purulent discharge.  Respiratory system: Clear to auscultation. Respiratory effort normal. Cardiovascular system: S1 & S2 heard, RRR. No JVD, murmurs, rubs, gallops or clicks. No pedal edema. Gastrointestinal system: Abdomen is nondistended, soft and nontender. No organomegaly or masses  felt. Normal bowel sounds heard. Colostomy area is benign in appearance. No signs of infection, nontender.  Central nervous system: Alert and oriented. No focal neurological deficits. Extremities: Symmetric 5 x 5 power. Psychiatry: Judgement and insight appear normal. Mood & affect appropriate.     Data Reviewed: I have personally reviewed following labs and imaging studies  CBC: Recent Labs  Lab 04/19/18 1418 04/20/18 0525  WBC 7.8 7.2  HGB 8.4* 8.1*  HCT 27.3* 26.6*  MCV 68.4* 68.6*  PLT 353 282   Basic Metabolic Panel: Recent Labs  Lab 04/19/18 1418 04/20/18 0414  NA  --  135  K  --  4.2  CL  --  114*  CO2  --  15*  GLUCOSE  --  102*  BUN  --  15  CREATININE 1.83* 1.78*  CALCIUM  --  8.6*   GFR: Estimated Creatinine Clearance: 65.2  mL/min (A) (by C-G formula based on SCr of 1.78 mg/dL (H)). Liver Function Tests: No results for input(s): AST, ALT, ALKPHOS, BILITOT, PROT, ALBUMIN in the last 168 hours. No results for input(s): LIPASE, AMYLASE in the last 168 hours. No results for input(s): AMMONIA in the last 168 hours. Coagulation Profile: No results for input(s): INR, PROTIME in the last 168 hours. Cardiac Enzymes: No results for input(s): CKTOTAL, CKMB, CKMBINDEX, TROPONINI in the last 168 hours. BNP (last 3 results) No results for input(s): PROBNP in the last 8760 hours. HbA1C: Recent Labs    04/19/18 1418  HGBA1C 5.3   CBG: No results for input(s): GLUCAP in the last 168 hours. Lipid Profile: No results for input(s): CHOL, HDL, LDLCALC, TRIG, CHOLHDL, LDLDIRECT in the last 72 hours. Thyroid Function Tests: Recent Labs    04/19/18 1418  TSH 0.113*  FREET4 0.65*   Anemia Panel: Recent Labs    04/19/18 1418 04/20/18 0414  VITAMINB12  --  395  FOLATE 6.6  --   FERRITIN 6*  --   TIBC 376  --   IRON 27*  --   RETICCTPCT 1.1  --    Urine analysis:    Component Value Date/Time   COLORURINE RED (A) 01/06/2017 0143   APPEARANCEUR HAZY (A) 01/06/2017 0143   LABSPEC 1.017 01/06/2017 0143   PHURINE 5.0 01/06/2017 0143   GLUCOSEU NEGATIVE 01/06/2017 0143   HGBUR LARGE (A) 01/06/2017 0143   BILIRUBINUR NEGATIVE 01/06/2017 0143   KETONESUR NEGATIVE 01/06/2017 0143   PROTEINUR 100 (A) 01/06/2017 0143   UROBILINOGEN 0.2 07/22/2015 0405   NITRITE NEGATIVE 01/06/2017 0143   LEUKOCYTESUR TRACE (A) 01/06/2017 0143   Sepsis Labs: @LABRCNTIP (procalcitonin:4,lacticidven:4)  )No results found for this or any previous visit (from the past 240 hour(s)).       Radiology Studies: No results found.      Scheduled Meds: . feeding supplement (PRO-STAT SUGAR FREE 64)  30 mL Oral BID  . heparin  5,000 Units Subcutaneous Q8H   Continuous Infusions: . sodium chloride 125 mL/hr at 04/20/18 1021      LOS: 1 day    Marney Setting, PA-S Oretha Milch MD [facility] Pager [phone]  If 7PM-7AM, please contact night-coverage [email or web address] Password [facility] 04/20/2018, 10:28 AM

## 2018-04-20 NOTE — Progress Notes (Addendum)
Initial Nutrition Assessment  DOCUMENTATION CODES:   Morbid obesity  INTERVENTION:   -Continue Prostat liquid protein PO 30 ml TID with meals, each supplement provides 100 kcal, 15 grams protein. -Provide Ensure Enlive po daily, each supplement provides 350 kcal and 20 grams of protein  NUTRITION DIAGNOSIS:   Increased nutrient needs related to wound healing as evidenced by estimated needs.  GOAL:   Patient will meet greater than or equal to 90% of their needs  MONITOR:   PO intake, Weight trends, Labs, I & O's, Skin, Supplement acceptance  REASON FOR ASSESSMENT:   Consult Wound healing  ASSESSMENT:   37 yo female who presents to the hospital with complaints of persistent, painful skin lesions that have worsened in nature the last couple days. The patient endorses increased pain with walking, defecating, and laying certain ways in bed. This has impacted her quality life, making it hard for her to navigate her home and she has not been eating or drinking properly  Patient reports consuming 1 meal a day typically at home. Pt states her appetite has been fine. Pt consuming 100% of meals at this time. Drinking Prostat, will increase to TID. Pt requesting Ensure supplements, will order 1 daily to help pt meet caloric needs for wound healing. Emphasized the importance of eating protein foods to help with healing.   Per chart review, pt has gained weight.   Medications reviewed. Labs reviewed: GFR: 41   NUTRITION - FOCUSED PHYSICAL EXAM:  Nutrition focused physical exam shows no sign of depletion of muscle mass or body fat.  Diet Order:   Diet Order           Diet heart healthy/carb modified Room service appropriate? Yes; Fluid consistency: Thin  Diet effective now          EDUCATION NEEDS:   Education needs have been addressed  Skin:  Skin Assessment: Skin Integrity Issues: Skin Integrity Issues:: Other (Comment) Other: non-presssure wounds: bilateral groin,  abdominal wall, buttocks  Last BM:  7/29  Height:   Ht Readings from Last 1 Encounters:  04/18/18 5' 2"  (1.575 m)    Weight:   Wt Readings from Last 1 Encounters:  04/18/18 (!) 360 lb (163.3 kg)    Ideal Body Weight:  50 kg  BMI:  Body mass index is 65.84 kg/m.  Estimated Nutritional Needs:   Kcal:  1950-2150  Protein:  85-95g  Fluid:  2L/day  Clayton Bibles, MS, RD, LDN [place] [facility] Pager: [phone] After Hours Pager: [phone]

## 2018-04-20 NOTE — Consult Note (Signed)
Chief Complaint  Patient presents with  . Hip Pain  :       Ophthalmology HPI: This is a 37 y.o.  female with a past ocular history listed below that presents with blurry vsiion, red eye, and worsenign pain OU for the last month.      Past Ocular History:  [place], Allergic conjunctivitis    Last Eye Exam:  2 months ago, ?[place]    Past Medical History:  Diagnosis Date  . Acute acalculous cholecystitis 11/16/2014  . Anal fistula   . Anemia   . Anxiety   . Asthma   . CHF (congestive heart failure) ([place])   . Complication of anesthesia    states she had a problem staying awake after her c-section, was [facility] from [facility] to [facility]  . Coronary artery disease   . Diverticulitis of colon 11/17/2014  . Headache    used to have migraines  . Hepatic steatosis 11/17/2014  . Herpes   . Hypertension   . Morbid obesity ([place]) 03/22/2009   Qualifier: Diagnosis of  By: Ronnald Ramp CMA, Chemira    . Neuropathy   . OSA (obstructive sleep apnea) 05/02/2014  . Seasonal allergies 06/13/2014  . Sickle cell anemia ([place])    She states she " has the trait" (07/20/15)  . Sleep apnea    uses Bipap  . Thyromegaly 05/02/2014     Past Surgical History:  Procedure Laterality Date  . c section 2011  2011  . COLONOSCOPY WITH PROPOFOL N/A 12/27/2015   Procedure: COLONOSCOPY WITH PROPOFOL;  Surgeon: Wonda Horner, MD;  Location: [facility] ENDOSCOPY;  Service: Endoscopy;  Laterality: N/A;  . COLOSTOMY N/A 06/11/2015   Procedure:  CREATION OF COLOSTOMY;  Surgeon: Georganna Skeans, MD;  Location: [place];  Service: General;  Laterality: N/A;  . ESOPHAGOGASTRODUODENOSCOPY N/A 07/19/2015   Procedure: ESOPHAGOGASTRODUODENOSCOPY (EGD);  Surgeon: Clarene Essex, MD;  Location[facility] ENDOSCOPY;  Service: Endoscopy;  Laterality: N/A;  . FLEXIBLE SIGMOIDOSCOPY N/A 06/05/2015   Procedure: FLEXIBLE SIGMOIDOSCOPY;  Surgeon: Ronald Lobo, MD;  Location: [facility] ENDOSCOPY;  Service: Endoscopy;  Laterality: N/A;  .  LAPAROTOMY N/A 06/09/2015   Procedure: EXPLORATORY LAPAROTOMY, DRAINAGE OF INTRAABDOMINAL ABSCESS, PARTIAL COLON RESECTION,  APPLICATION OF WOUND VAC;  Surgeon: Georganna Skeans, MD;  Location: [place];  Service: General;  Laterality: N/A;  . LAPAROTOMY N/A 06/11/2015   Procedure: RE-EXPLORATION OF ABDOMEN;  Surgeon: Georganna Skeans, MD;  Location: [place];  Service: General;  Laterality: N/A;     Social History   Socioeconomic History  . Marital status: Single    Spouse name: Not on file  . Number of children: Not on file  . Years of education: Not on file  . Highest education level: Not on file  Occupational History  . Not on file  Social Needs  . Financial resource strain: Not on file  . Food insecurity:    Worry: Not on file    Inability: Not on file  . Transportation needs:    Medical: Not on file    Non-medical: Not on file  Tobacco Use  . Smoking status: Current Some Day Smoker    Packs/day: 0.50    Years: 4.00    Pack years: 2.00    Types: Cigarettes    Start date: 07/24/2014    Last attempt to quit: 05/27/2015    Years since quitting: 2.9  . Smokeless tobacco: Never Used  Substance and Sexual Activity  . Alcohol use: No  Alcohol/week: 0.0 oz  . Drug use: No  . Sexual activity: Not on file  Lifestyle  . Physical activity:    Days per week: Not on file    Minutes per session: Not on file  . Stress: Not on file  Relationships  . Social connections:    Talks on phone: Not on file    Gets together: Not on file    Attends religious service: Not on file    Active member of club or organization: Not on file    Attends meetings of clubs or organizations: Not on file    Relationship status: Not on file  . Intimate partner violence:    Fear of current or ex partner: Not on file    Emotionally abused: Not on file    Physically abused: Not on file    Forced sexual activity: Not on file  Other Topics Concern  . Not on file  Social History Narrative  . Not on file      Allergies  Allergen Reactions  . Other Shortness Of Breath and Swelling    Tree nuts  . Penicillins Other (See Comments)    Unknown childhood allergy Has patient had a PCN reaction causing immediate rash, facial/tongue/throat swelling, SOB or lightheadedness with hypotension: Unknown Has patient had a PCN reaction causing severe rash involving mucus membranes or skin necrosis: Unknown Has patient had a PCN reaction that required hospitalization: Unknown Has patient had a PCN reaction occurring within the last 10 years: Unknown If all of the above answers are "NO", then may proceed with Cephalosporin use.      No current facility-administered medications on file prior to encounter.    Current Outpatient Medications on File Prior to Encounter  Medication Sig Dispense Refill  . acetaminophen (TYLENOL) 500 MG tablet Take 1,000 mg by mouth every 6 (six) hours as needed for mild pain.    Marland Kitchen doxycycline (VIBRAMYCIN) 100 MG capsule Take 1 capsule (100 mg total) by mouth 2 (two) times daily. 28 capsule 0  . ibuprofen (ADVIL,MOTRIN) 200 MG tablet Take 200 mg by mouth every 6 (six) hours as needed for headache or mild pain.    Marland Kitchen PROAIR HFA 108 (90 Base) MCG/ACT inhaler Inhale 2 puffs into the lungs 4 (four) times daily as needed for wheezing or shortness of breath.   2  . traMADol (ULTRAM) 50 MG tablet Take 1 tablet (50 mg total) by mouth every 6 (six) hours as needed. 15 tablet 0  . fluconazole (DIFLUCAN) 100 MG tablet Take 2 tablets (200 mg total) by mouth daily. (Patient not taking: Reported on 04/19/2018) 14 tablet 0  . metoCLOPramide (REGLAN) 10 MG tablet Take 1 tablet (10 mg total) by mouth every 6 (six) hours as needed for nausea (or headache). (Patient not taking: Reported on 04/19/2018) 30 tablet 0     ROS    Exam:  General: Awake, Alert, Oriented *3  Vision (near): without correction   OD: HM   OS: 20/100  Confrontational Field:   Full to count fingers, both  eyes  Extraocular Motility:  Full ductions and versions, both eyes   External:   Normal Symmetry, V1-3 intact, infraorbital nerve appears intact.   Pupils  OD: 48m to 313mreactive without afferent pupillary defect (APD)  OS: 58m358mo 3mm98mactive without afferent pupillary defect (APD)   IOP(tonopen)  OD: 16  OS: 15  Slit Lamp Exam:  Lids/Lashes  OD: Eyelid margin lesions  OS: Eyelid margin lesions  Conjucntiva/Sclera  OD: 2+ injection  OS: 2+ injection  Cornea  OD: [place], munson sign, central corneal scarring. Inferior epi defect wtihout frank infiltrate.   OS: ?Munson sign.   Anterior Chamber  OD: Deep and quiet  OS: Deep and quiet. No obviosu cell, flare  Iris  OD: Normal iris architecture  OS: Normal Iris Architecture   Lens  OD: Clear, Without significant opacities  OS: Clear, Without significant opacities  Anterior Vitreous  OD: Clear, without cell  OS: Clear without cell   POSTERIOR POLE EXAM (Dialated with phenylephrine and tropicamide.Dilation may last up to 24 hours) Poor view OU.  Grossly normal disc macula vessels and periphery.     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 Unclear etiology given limited bedside exam. Could be chrons related uveitis, HSV, or acute hydrops od with epi defect.   Start:  Maxitrol Drops QID OU Start Zirgan QID OU

## 2018-04-20 NOTE — Progress Notes (Signed)
Patient crying in room, states that she can no longer see out of her right eye, everything is blurry, denies headache, eyes noted to be reddened.  MD paged, awaiting return response

## 2018-04-20 NOTE — Progress Notes (Signed)
OT Cancellation Note  Patient Details Name: Dominique [name] MRN: [id number] DOB: [birth year]    Pt had just finished with wound nurse.  Ask OT to return another time. Will check on next day Kari Baars, Philo Betsy Pries 04/20/2018, 1:45 PM

## 2018-04-20 NOTE — Consult Note (Addendum)
[place] Nurse wound consult note Reason for Consult: multiple wounds in the bilateral groin and under the pannus. Patient is not able to care for this area well and the areas are very painful  I have reviewed the chart and images. Discuss care with bedside nurse and reviewed skin assessment from wound treatment associate. Due to the location feel that absobant dressing rather and moist dressing would work best along with silver properties of a silver hydrofiber for pain and antimicrobial properties.   Will DC saline moist gauze dressings and switch to silver hydrofiber.  May not need tape to hold in place could use skin folds to hold if needed to limit exposure to tape.  Wound type: Intertriginous skin damage, unclear etiology Moisture?  Due to location possibly hydradenitis, not noted in the axilla region however presentation is very much like hydradenitis cases I have seen previously Does not appear to have fungal overgrowth Pressure Injury POA: NA Measurement:linear full thickness wounds bilateral groin and patchy areas of skin loss inner thighs  Wound bed: partial and full thickness skin ulcerations Drainage: noted odor in the room prior to dressing change Periwound:intact  Dressing procedure/placement/frequency: Silver hydrofiber to the open areas  Discussed POC with patient and bedside nurse.  Re consult if needed, will not follow at this time. Thanks  Harlon Kutner [employer], RN,CWOCN, CNS, [place] [phone])

## 2018-04-21 DIAGNOSIS — H109 Unspecified conjunctivitis: Secondary | ICD-10-CM

## 2018-04-21 LAB — CBC
HEMATOCRIT: 24.6 % — AB (ref 36.0–46.0)
HEMOGLOBIN: 7.3 g/dL — AB (ref 12.0–15.0)
MCH: 20.6 pg — ABNORMAL LOW (ref 26.0–34.0)
MCHC: 29.7 g/dL — ABNORMAL LOW (ref 30.0–36.0)
MCV: 69.3 fL — ABNORMAL LOW (ref 78.0–100.0)
Platelets: 356 10*3/uL (ref 150–400)
RBC: 3.55 MIL/uL — AB (ref 3.87–5.11)
RDW: 19.9 % — AB (ref 11.5–15.5)
WBC: 7.6 10*3/uL (ref 4.0–10.5)

## 2018-04-21 LAB — BASIC METABOLIC PANEL
ANION GAP: 6 (ref 5–15)
BUN: 11 mg/dL (ref 6–20)
CHLORIDE: 115 mmol/L — AB (ref 98–111)
CO2: 17 mmol/L — ABNORMAL LOW (ref 22–32)
Calcium: 8.7 mg/dL — ABNORMAL LOW (ref 8.9–10.3)
Creatinine, Ser: 1.33 mg/dL — ABNORMAL HIGH (ref 0.44–1.00)
GFR, EST AFRICAN AMERICAN: 58 mL/min — AB (ref >60)
GFR, EST NON AFRICAN AMERICAN: 50 mL/min — AB (ref >60)
Glucose, Bld: 94 mg/dL (ref 70–99)
POTASSIUM: 3.8 mmol/L (ref 3.5–5.1)
SODIUM: 138 mmol/L (ref 135–145)

## 2018-04-21 NOTE — Evaluation (Signed)
Occupational Therapy Evaluation Patient Details Name: Dominique [name] MRN: [id number] DOB: 1980-09-27 Today's Date: 04/21/2018    History of Present Illness 37yo female with history of ulcers in groin since March which have gotten worse and started draining in the past 2 weeks and have become very painful. PMH anxiety, asthma, CHF, HTN, morbid obesity, neuropathy, OSA, sickle cell anemia, Chrons s/p colectomy and colostomy    Clinical Impression   Pt admitted with the above. Pt currently with functional limitations due to the deficits listed below (see OT Problem List).  Pt will benefit from skilled OT to increase their safety and independence with ADL and functional mobility for ADL to facilitate discharge to venue listed below.      Follow Up Recommendations  Home health OT;SNF    Equipment Recommendations  3 in 1 bedside commode       Precautions / Restrictions Precautions Precautions: Other (comment) Precaution Comments: colostomy bag RUQ  Restrictions Weight Bearing Restrictions: No      Mobility Bed Mobility               General bed mobility comments: initiated but pain increased - returned to supine  Transfers                          ADL either performed or assessed with clinical judgement   ADL Overall ADL's : Needs assistance/impaired Eating/Feeding: Set up;Bed level   Grooming: Set up;Bed level               Lower Body Dressing: +2 for physical assistance;+2 for safety/equipment;Bed level;With adaptive equipment;Cueing for compensatory techniques                 General ADL Comments: AE issued as pt medicaid.  Pt educated on use. Pain limiting pt. RN aware     Vision Patient Visual Report: No change from baseline              Pertinent Vitals/Pain Pain Score: 8  Pain Location: wound sites  Pain Descriptors / Indicators: Aching;Sore Pain Intervention(s): Limited activity within patient's tolerance;Repositioned;Monitored  during session;Patient requesting pain meds-RN notified     Hand Dominance     Extremity/Trunk Assessment Upper Extremity Assessment Upper Extremity Assessment: Generalized weakness           Communication Communication Communication: No difficulties   Cognition Arousal/Alertness: Awake/alert Behavior During Therapy: WFL for tasks assessed/performed Overall Cognitive Status: Within Functional Limits for tasks assessed                                                Home Living Family/patient expects to be discharged to:: Private residence Living Arrangements: Parent;Children Available Help at Discharge: Family;Available 24 hours/day Type of Home: Apartment Home Access: Stairs to enter Entrance Stairs-Number of Steps: 4  Entrance Stairs-Rails: Can reach both Home Layout: One level     Bathroom Shower/Tub: Teacher, early years/pre: Standard     Home Equipment: None          Prior Functioning/Environment Level of Independence: Independent                 OT Problem List: Decreased strength;Decreased activity tolerance;Impaired balance (sitting and/or standing);Pain;Obesity;Decreased safety awareness;Decreased knowledge of use of DME or AE      OT Treatment/Interventions: Self-care/ADL training;Patient/family education;DME  and/or AE instruction    OT Goals(Current goals can be found in the care plan section) Acute Rehab OT Goals Patient Stated Goal: reduce pain  OT Goal Formulation: With patient Time For Goal Achievement: 04/21/18 Potential to Achieve Goals: Good ADL Goals Pt Will Perform Grooming: with modified independence;standing Pt Will Perform Lower Body Bathing: with modified independence;sit to/from stand Pt Will Perform Lower Body Dressing: with modified independence;sit to/from stand Pt Will Transfer to Toilet: with modified independence;ambulating;regular height toilet Pt Will Perform Toileting - Clothing  Manipulation and hygiene: with modified independence;sit to/from stand  OT Frequency: Min 2X/week   Barriers to D/C:               AM-PAC PT "6 Clicks" Daily Activity     Outcome Measure Help from another person eating meals?: A Little Help from another person taking care of personal grooming?: A Little Help from another person toileting, which includes using toliet, bedpan, or urinal?: Total Help from another person bathing (including washing, rinsing, drying)?: Total Help from another person to put on and taking off regular upper body clothing?: Total Help from another person to put on and taking off regular lower body clothing?: Total 6 Click Score: 10   End of Session Nurse Communication: Mobility status;Patient requests pain meds  Activity Tolerance: Patient tolerated treatment well Patient left: in bed  OT Visit Diagnosis: Unsteadiness on feet (R26.81);Muscle weakness (generalized) (M62.81)                Time: 5844-1712 OT Time Calculation (min): 23 min Charges:  OT General Charges $OT Visit: 1 Visit OT Evaluation $OT Eval Moderate Complexity: 1 Mod OT Treatments $Self Care/Home Management : 8-22 mins  [place], [place]  Betsy Pries 04/21/2018, 2:40 PM

## 2018-04-21 NOTE — Progress Notes (Signed)
Spoke with patient at bedside, discussed need for [facility] for wound care. She states she was with [facility] previously and would like to use them again. She states her sister will be teachable care giver. She is unsure when she will d/c. Will continue to follow for d/c needs. [phone]

## 2018-04-21 NOTE — Progress Notes (Signed)
Attestation  37 year old female medical history significant for Crohn's disease s/p  colectomy (2016) with colostomy, multiple peri-inguinal and peri-gluteal wounds who presented with worsening pain from above wounds.  She was last seen by her general surgeon on 12/04/2017 for evaluation of open wound and a midline incision scar from prior colectomy surgery.  At that time her inguinal/peri-gluteal wounds were assessed and patient was told to use dry dressings and silver nitrate.  She has been to the ED twice  (7/13 previously).  On last ED visit she was prescribed pain medication, doxycycline Diflucan will defer to wound care.  Patient comes in this time with worsening pain related to wounds, decreased mobility due to said pain.  Denies any fevers chills, purulent drainage, abdominal pain, diarrhea, nausea or vomiting.  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 Since the early part of this month both eyes have turned red.  She's tried allergy medications with no improvement.  She's had right eye pain that she thought was associated with a headache for several weeks.  Her left eye vision has become more blurry this month as well.   Peri inguinal and perigluteal wounds, stable -looks clean, no sepsis - follow wound care recommendations -unclear nidus but her body habitus has limited wound healing -judicious pain control(oxy 10 mg q4H PRN,), discontinued IV morphine - discontinued Toradol given AKI ( though suspect prerenal)  Crohn's disease, s/p colectomy with colostomy (2016), stable -no active signs of intra-intestinal disease -has been off Remicade for several months ( was supposed to resume this year)  AKI, suspect pre-renal etiology, improving -creatinine on admission of 1.8-- 1.78--1.3 (baseline less than 1).  -Suspect prerenal given admitted decreased p.o. intake due to pain and improvement with scheduled IV  fluids and increase hydration - monitor output via foley - Ua unremarkable -Continue IV fluids at 125 cc/h, x12 more hours - repeat BMP in am, if worsens will obtain renal ultrasound and consult nephrology   Bilateral eye redness, worsening vision -Last evaluated by Dr. 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 Not concerning for hyperthyrotoxicosis. More consistent with non-thyroidal illness -repeat as outpatient  Disposition: Anticipate discharge next 24 hours if creatinine continues to improve.  Social worker arranging home health wound care.  Physical Exam Gen: lying in bed, comfortably Eyes:       CV: RRR, no murmurs, rubs or gallops, no peripheral edema Abd: soft, non-distended, non-tender, ostomy bag in place with no surrounding erythema or drainage Resp: normal respiratory effort on room air Skin: multiple wounds in bilateral inguinal folds, perianal area ( photos from 7/27 below), dressings in place now look clean, dry, and intact      Psych: normal affect and mood. Tearful and anxious on exam Neuro: alert and orientedx4     I saw and evaluated the patient and agree with the nurse PA-Student's note and plan.    Desiree Hane MD [facility]  Pager [phone]       PROGRESS NOTE    Brookelle Pellicane  DUK:[id number] DOB: 13-Nov-1980 DOA: 04/19/2018 PCP: Benito Mccreedy, MD   Outpatient  Specialists:   HPI:  37 yo female who presents to the hospital with complaints of persistent, painful skin lesions that have worsened in nature the last couple days. The patient endorses increased pain with walking, defecating, and laying certain ways in bed. This has impacted her quality life, making it hard for her to navigate her home and she has not been eating or drinking properly. Lives at home with her father and 15-year-old daughter who are not active caregivers to her. This complaint has been an ongoing issue since beginning of March. The patient had a colectomy and colostomy 01/14/17. However, in March, she noticed her wounds reopened so she went to see surgery 12/04/17. During the visit, she discussed a wound on her anal cleft as well. 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 Her physician also recommended she see dermatology; however, the patient's health insurance was denied. After her visit, the patient reports no improvement in her pain. She tried Preparation H with no improvement and endorses finishing the Valacyclovir as prescribed.   The patient then went to the ED 12/12/17 for her anal pain/discomfort, during which the physicians attempted a more aggressive approach. The physicians prescribed her Bacitracin + Percocet/Roxicet + Valacyclovir (x21 days). She endorses completing her medications and using the other medications with no improvement. She revisited the ED 02/05/18 where she was prescribed Lidocaine + Desitin + Sitz baths. She revisited the ED again 04/04/18 when she was prescribed Percocet/Roxicet + Tramadol + Diflucan + Doxycycline and was referred to wound care. The patient was scheduled to see Wound Care outpatient 05/01/18, however her pain became too much to handle. Currently denies any fever/chills.  Brief Narrative:  37 yo female with a PMH significant for CAD, CHF, Crohn's s/p colectomy and colostomy 01/14/18, sickle cell  trait with chronic anemia, morbid obesity, and hepatic steatosis is here with complaints of ongoing complications with inguinal skin breaks as well as anal cleft fissures. The patient is afebrile and wounds are showing no signs of infection. CBC shows anemia to her baseline with no elevated WBCs. Due to new onset left eye blurred vision 2/2 to Crohn's complications and worsening chronic wounds, the patient was admitted for additional monitoring, IV fluids, and wound management. Wound Care is following and recommends absorbant dressings and Silvadene for pain and antimicrobial management.   Other problems: new acute left eye blurred vision. Endorses right eye blurred vision since March. Could be complications 2/2 to her Crohn's Disease. ED did a thorough exam and noted the following: "Right eye shows corneal ulcer and fluorescein uptake. No proptosis or hyphema. Normal EOMs. No periorbital edema or erythema. Slightly purulent discharge from bilateral eyes. Corneal defect on the right with fluorescein uptake, suspicious for ulcer. No dendritic staining. Negative Seidel's sign. IOP 13 with 90% CI (right eye)."    Assessment & Plan:   Principal Problem:   Multiple wounds of skin Active Problems:   Morbid obesity- BMI 67.5   Hepatic steatosis   Crohn's colitis with perforation s/p left colectomy/colostomy 2016   OSA on CPAP   Adjustment disorder with mixed anxiety and depressed mood   Sickle cell trait (HCC)   AKI (acute kidney injury) ([place])   Abnormal TSH   Redness of both eyes   Eye pain, right  Multiple skin breaks in pubis, inguinal, anal areas: Lesions are superficial, dry with no signs of infection. No discharge, no warmness to touch. The patient's pain level has improved since yesterday. She has been afebrile since admission. Receiving oxycodone and morphine prn for pain. Wound care has been consulted and has recommended that moist dressings be replaced with absorbant dressings and silvadene  be used for pain and antimicrobial properties. The patient would follow up with wound care for her scheduled appointment 05/01/18  Acute Kidney Injury, possibly prerenal: The patient's creatinine is 1.78, which is higher than her baseline. This may be attributed to dehydration due to recent poor hydration. Fluids increased to 125. UA ordered to rule out other etiology of increased Cr. Repeat BMP ordered. The patient's intake/output will be monitored.   Euthyroid Sick Syndrome: TSH 0.113. Free T4 0.65. Denies any bradycardia or increased lethargy. The patient will need to follow up with her PCP for repeat labs in 6 weeks.  Crohn's Colitis with perforation s/p left colectomy/colostomy: Intraintestinal disease is stable. The patient denies N/V/abdominal pain. Her colostomy bag production has been normal with no acute changes. At this time, her intraintestinal disease is under control. The patient reports never restarting her Remicade. She should follow up with her GI doctor for management.  Bilateral Orbital Conjunctivitis: The patient reports blurred vision in her right eye since March that she has recently noticed is now affecting her left eye as well. Question if it is 2/2 to extraintestinal complications from her Crohn's. The patient should follow up with her GI doctor outpatient.   DVT prophylaxis: Heparin SQ Code Status: Full Family Communication: No family at bedside during interview Disposition Plan: Patient was seen by Wound Care and they recommend her dressings be changed to absorbent dressings and Silvadene be used for pain and antimicrobial management. At this time, the patient has been evaluated by PT who believes she could benefit from in-home aid to help with wound cleaning and dressings. Pending repeat labs to evaluate for improvement of AKI, the patient will continue IV fluids for dehydration and discharge expected to be tomorrow. The patient should follow up with wound care for her  scheduled appointment 05/01/18.   Consultants:   Wound Care  Procedures:   None  Antimicrobials:   None   Subjective: The patient was lying supine in the hospital during interview. States her pain is better than yesterday. Worse when palpated.   Objective: Vitals:   04/20/18 1404 04/20/18 2104 04/21/18 0452 04/21/18 1445  BP: 130/74 129/78 121/64 97/81  Pulse: 93 99 (!) 106 100  Resp: 17 18 20 16   Temp: 97.9 F (36.6 C) 99.2 F (37.3 C) 98.9 F (37.2 C) 98.6 F (37 C)  TempSrc: Oral Oral Oral Oral  SpO2:  96% 98% 93%  Weight:      Height:        Intake/Output Summary (Last 24 hours) at 04/21/2018 1605 Last data filed at 04/21/2018 1553 Gross per 24 hour  Intake 4134.2 ml  Output 2150 ml  Net 1984.2 ml   Filed Weights   04/18/18 2010  Weight: (!) 163.3 kg (360 lb)    Examination:  General exam: Appears calm and comfortable  Skin: bilateral inguinal breaks in the skin without drainage or pus, no signs of infection, and TTP. Multiple small 1 cm lesions on pubis region. 2 anal fissures along cleft - no drainage or pus, no signs of infection, and TTP. Orbitals: Normal EOMs. Bilateral corneal conjunctivitis with purulent discharge.  Respiratory system: Clear to auscultation. Respiratory effort normal. Cardiovascular system: S1 & S2 heard, RRR. No JVD, murmurs, rubs, gallops or clicks. No pedal edema. Gastrointestinal system: Abdomen is nondistended, soft and nontender. No organomegaly or masses felt. Normal bowel sounds heard. Colostomy area is benign in appearance. No signs of infection, nontender.  Central nervous system: Alert and oriented. No focal neurological deficits. Extremities: Symmetric 5 x 5 power. Psychiatry: Judgement and insight appear normal. Mood & affect appropriate.     Data Reviewed: I have personally reviewed following labs and imaging studies  CBC: Recent Labs  Lab 04/19/18 1418 04/20/18 0525 04/21/18 0426  WBC 7.8 7.2 7.6  HGB 8.4*  8.1* 7.3*  HCT 27.3* 26.6* 24.6*  MCV 68.4* 68.6* 69.3*  PLT 353 348 500   Basic Metabolic Panel: Recent Labs  Lab 04/19/18 1418 04/20/18 0414 04/21/18 0426  NA  --  135 138  K  --  4.2 3.8  CL  --  114* 115*  CO2  --  15* 17*  GLUCOSE  --  102* 94  BUN  --  15 11  CREATININE 1.83* 1.78* 1.33*  CALCIUM  --  8.6* 8.7*   GFR: Estimated Creatinine Clearance: 87.2 mL/min (A) (by C-G formula based on SCr of 1.33 mg/dL (H)). Liver Function Tests: No results for input(s): AST, ALT, ALKPHOS, BILITOT, PROT, ALBUMIN in the last 168 hours. No results for input(s): LIPASE, AMYLASE in the last 168 hours. No results for input(s): AMMONIA in the last 168 hours. Coagulation Profile: No results for input(s): INR, PROTIME in the last 168 hours. Cardiac Enzymes: No results for input(s): CKTOTAL, CKMB, CKMBINDEX, TROPONINI in the last 168 hours. BNP (last 3 results) No results for input(s): PROBNP in the last 8760 hours. HbA1C: Recent Labs    04/19/18 1418  HGBA1C 5.3   CBG: No results for input(s): GLUCAP in the last 168 hours. Lipid Profile: No results for input(s): CHOL, HDL, LDLCALC, TRIG, CHOLHDL, LDLDIRECT in the last 72 hours. Thyroid Function Tests: Recent Labs    04/19/18 1418  TSH 0.113*  FREET4 0.65*   Anemia Panel: Recent Labs    04/19/18 1418 04/20/18 0414  VITAMINB12  --  395  FOLATE 6.6  --   FERRITIN 6*  --   TIBC 376  --   IRON 27*  --   RETICCTPCT 1.1  --    Urine analysis:    Component Value Date/Time   COLORURINE YELLOW 04/20/2018 0859   APPEARANCEUR HAZY (A) 04/20/2018 0859   LABSPEC 1.015 04/20/2018 0859   PHURINE 5.0 04/20/2018 0859   GLUCOSEU NEGATIVE 04/20/2018 0859   HGBUR NEGATIVE 04/20/2018 0859   BILIRUBINUR NEGATIVE 04/20/2018 0859   KETONESUR NEGATIVE 04/20/2018 0859   PROTEINUR 30 (A) 04/20/2018 0859   UROBILINOGEN 0.2 07/22/2015 0405   NITRITE NEGATIVE 04/20/2018 0859   LEUKOCYTESUR NEGATIVE 04/20/2018 0859   Sepsis  Labs: @LABRCNTIP (procalcitonin:4,lacticidven:4)  )No results found for this or any previous visit (from the past 240 hour(s)).       Radiology Studies: No results found.      Scheduled Meds: . feeding supplement (ENSURE ENLIVE)  237 mL Oral Q24H  . feeding supplement (PRO-STAT SUGAR FREE 64)  30 mL Oral TID  . Ganciclovir  1 drop Both Eyes 5 X Daily  . heparin  5,000 Units Subcutaneous Q8H  . neomycin-polymyxin b-dexamethasone  1 drop Both Eyes 5 X Daily   Continuous Infusions: . sodium chloride 125 mL/hr at 04/21/18 1007  LOS: 2 days    Desiree Hane, PA-S Oretha Milch MD [facility] Pager [phone]  If 7PM-7AM, please contact night-coverage [email or web address] Password [email or web address] 04/21/2018, 4:05 PM

## 2018-04-22 DIAGNOSIS — R238 Other skin changes: Secondary | ICD-10-CM

## 2018-04-22 NOTE — Progress Notes (Deleted)
Attestation  37 year old female medical history significant for Crohn's disease s/p  colectomy (2016) with colostomy, multiple peri-inguinal and peri-gluteal wounds who presented with worsening pain from above wounds.  She was last seen by her general surgeon on 12/04/2017 for evaluation of open wound and a midline incision scar from prior colectomy surgery.  At that time her inguinal/peri-gluteal wounds were assessed and patient was told to use dry dressings and silver nitrate.  She has been to the ED twice  (7/13 previously).  On last ED visit she was prescribed pain medication, doxycycline Diflucan will defer to wound care.  Patient comes in this time with worsening pain related to wounds, decreased mobility due to said pain.  Denies any fevers chills, purulent drainage, abdominal pain, diarrhea, nausea or vomiting.  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 Since the early part of this month both eyes have turned red.  She's tried allergy medications with no improvement.  She's had right eye pain that she thought was associated with a headache for several weeks.  Her left eye vision has become more blurry this month as well.   Peri inguinal and perigluteal wounds, stable -looks clean, no sepsis - follow wound care recommendations -unclear nidus but her body habitus has limited wound healing -judicious pain control(oxy 10 mg q4H PRN,), discontinued IV morphine - discontinued Toradol given AKI ( though suspect prerenal)  Crohn's disease, s/p colectomy with colostomy (2016), stable -no active signs of intra-intestinal disease -has been off Remicade for several months ( was supposed to resume this year)  AKI, suspect pre-renal etiology, improving -creatinine on admission of 1.8-- 1.78--1.3 (baseline less than 1).  -Suspect prerenal given admitted decreased p.o. intake due to pain and improvement with scheduled IV  fluids and increase hydration - monitor output via foley - Ua unremarkable -Continue IV fluids at 125 cc/h, x12 more hours - repeat BMP in am, if worsens will obtain renal ultrasound and consult nephrology   Bilateral eye redness, worsening vision -Last evaluated by Dr. 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 Not concerning for hyperthyrotoxicosis. More consistent with non-thyroidal illness -repeat as outpatient  Disposition: Anticipate discharge next 24 hours if creatinine continues to improve.  Social worker arranging home health wound care.  Physical Exam Gen: lying in bed, comfortably Eyes:       CV: RRR, no murmurs, rubs or gallops, no peripheral edema Abd: soft, non-distended, non-tender, ostomy bag in place with no surrounding erythema or drainage Resp: normal respiratory effort on room air Skin: multiple wounds in bilateral inguinal folds, perianal area ( photos from 7/27 below), dressings in place now look clean, dry, and intact      Psych: normal affect and mood. Tearful and anxious on exam Neuro: alert and orientedx4     I saw and evaluated the patient and agree with the nurse PA-Student's note and plan.    Roxan Hockey MD [facility]  Pager  [phone]       PROGRESS NOTE    Dominique [name]  NLZ:[id number] DOB: [birth year] DOA: 04/19/2018 PCP: Benito Mccreedy, MD   Outpatient  Specialists:   HPI:  37 yo female who presents to the hospital with complaints of persistent, painful skin lesions that have worsened in nature the last couple days. The patient endorses increased pain with walking, defecating, and laying certain ways in bed. This has impacted her quality life, making it hard for her to navigate her home and she has not been eating or drinking properly. Lives at home with her father and 11-year-old daughter who are not active caregivers to her. This complaint has been an ongoing issue since beginning of March. The patient had a colectomy and colostomy 01/14/17. However, in March, she noticed her wounds reopened so she went to see surgery 12/04/17. During the visit, she discussed a wound on her anal cleft as well. 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 Her physician also recommended she see dermatology; however, the patient's health insurance was denied. After her visit, the patient reports no improvement in her pain. She tried Preparation H with no improvement and endorses finishing the Valacyclovir as prescribed.   The patient then went to the ED 12/12/17 for her anal pain/discomfort, during which the physicians attempted a more aggressive approach. The physicians prescribed her Bacitracin + Percocet/Roxicet + Valacyclovir (x21 days). She endorses completing her medications and using the other medications with no improvement. She revisited the ED 02/05/18 where she was prescribed Lidocaine + Desitin + Sitz baths. She revisited the ED again 04/04/18 when she was prescribed Percocet/Roxicet + Tramadol + Diflucan + Doxycycline and was referred to wound care. The patient was scheduled to see Wound Care outpatient 05/01/18, however her pain became too much to handle. Currently denies any fever/chills.  Brief Narrative:  37 yo female with a PMH significant for CAD, CHF, Crohn's s/p colectomy and colostomy 01/14/18, sickle cell  trait with chronic anemia, morbid obesity, and hepatic steatosis is here with complaints of ongoing complications with inguinal skin breaks as well as anal cleft fissures. The patient is afebrile and wounds are showing no signs of infection. CBC shows anemia to her baseline with no elevated WBCs. Due to new onset left eye blurred vision 2/2 to Crohn's complications and worsening chronic wounds, the patient was admitted for additional monitoring, IV fluids, and wound management. Wound Care is following and recommends absorbant dressings and Silvadene for pain and antimicrobial management.   Other problems: new acute left eye blurred vision. Endorses right eye blurred vision since March. Could be complications 2/2 to her Crohn's Disease. ED did a thorough exam and noted the following: "Right eye shows corneal ulcer and fluorescein uptake. No proptosis or hyphema. Normal EOMs. No periorbital edema or erythema. Slightly purulent discharge from bilateral eyes. Corneal defect on the right with fluorescein uptake, suspicious for ulcer. No dendritic staining. Negative Seidel's sign. IOP 13 with 90% CI (right eye)."    Assessment & Plan:   Principal Problem:   Multiple wounds of skin Active Problems:   Morbid obesity- BMI 67.5   Hepatic steatosis   Crohn's colitis with perforation s/p left colectomy/colostomy 2016   OSA on CPAP   Adjustment disorder with mixed anxiety and depressed mood   Sickle cell trait (HCC)   AKI (acute kidney injury) ([place])   Abnormal TSH   Redness of both eyes   Eye pain, right  Multiple  skin breaks in pubis, inguinal, anal areas: Lesions are superficial, dry with no signs of infection. No discharge, no warmness to touch. The patient's pain level has improved since yesterday. She has been afebrile since admission. Receiving oxycodone and morphine prn for pain. Wound care has been consulted and has recommended that moist dressings be replaced with absorbant dressings and silvadene  be used for pain and antimicrobial properties. The patient would follow up with wound care for her scheduled appointment 05/01/18  Unlikely to be Behcet's disease and unlikely to be  hidradenitis suppurativa, clinically favor extra intestinal manifestations of Crohn's disease  Acute Kidney Injury, possibly prerenal: Creatinine is trending down,  C/n  IV fluids. UA ordered to rule out other etiology of increased Cr. Repeat BMP ordered. The patient's intake/output will be monitored.   Euthyroid Sick Syndrome: TSH 0.113. Free T4 0.65. Denies any bradycardia or increased lethargy. The patient will need to follow up with her PCP for repeat labs in 6 weeks.  Crohn's Colitis with perforation s/p left colectomy/colostomy: Intraintestinal disease is stable. The patient denies N/V/abdominal pain. Her colostomy bag production has been normal with no acute changes. At this time, her intraintestinal disease is under control. The patient reports never restarting her Remicade. She should follow up with her GI doctor for management.  Bilateral Orbital Conjunctivitis: The patient reports blurred vision in her right eye since March that she has recently noticed is now affecting her left eye as well. ??? Uveitis--- Question if it is 2/2 to extraintestinal complications from her Crohn's. The patient should follow up with her GI doctor outpatient. Unlikely to be Behcet's disease ,  clinically favor extra intestinal manifestations of Crohn's disease  Depression--- discussed with patient, admits to depressed mood, admits to anhedonia and poor sleep, no suicidal homicidal ideation or plan, start Cymbalta   DVT prophylaxis: Heparin SQ Code Status: Full Family Communication: No family at bedside during interview Disposition Plan: Patient was seen by Wound Care and they recommend her dressings be changed to absorbent dressings and Silvadene be used for pain and antimicrobial management. At this time, the patient has been  evaluated by PT who believes she could benefit from in-home aid to help with wound cleaning and dressings. The patient should follow up with wound care for her scheduled appointment 05/01/18.   Consultants:   Wound Care  Procedures:   None  Antimicrobials:   None   Subjective: Resting comfortably, admits to depressed mood, admits to anhedonia and poor sleep, no suicidal homicidal ideation or plan  Objective: Vitals:   04/21/18 0452 04/21/18 1445 04/21/18 2137 04/22/18 1347  BP: 121/64 97/81 (!) 143/69 118/62  Pulse: (!) 106 100 98   Resp: 20 16 20 16   Temp: 98.9 F (37.2 C) 98.6 F (37 C) 98.9 F (37.2 C) 98.9 F (37.2 C)  TempSrc: Oral Oral Oral Oral  SpO2: 98% 93% 98%   Weight:      Height:        Intake/Output Summary (Last 24 hours) at 04/22/2018 1846 Last data filed at 04/22/2018 1804 Gross per 24 hour  Intake 4244.03 ml  Output 2750 ml  Net 1494.03 ml   Filed Weights   04/18/18 2010  Weight: (!) 163.3 kg (360 lb)    Examination:  General exam: Appears calm and comfortable , morbidly obese Skin: bilateral inguinal breaks in the skin without drainage or pus, no signs of infection, and TTP. Multiple small 1 cm lesions on pubis region. 2 anal fissures along cleft -  no drainage or pus, no signs of infection, and TTP. Orbitals: Normal EOMs. Bilateral corneal conjunctivitis with purulent discharge.  Respiratory system: Clear to auscultation. Respiratory effort normal. Cardiovascular system: S1 & S2 heard, RRR. No JVD, murmurs, rubs, gallops or clicks. No pedal edema. Gastrointestinal system: Abdomen is nondistended, soft and nontender. No organomegaly or masses felt. Normal bowel sounds heard. Colostomy area is benign in appearance. No signs of infection, nontender.  Central nervous system: Alert and oriented. No focal neurological deficits. Extremities: Symmetric 5 x 5 power. Psychiatry: Judgement and insight appear normal. Mood & affect appropriate.    CBC: Recent Labs  Lab 04/19/18 1418 04/20/18 0525 04/21/18 0426  WBC 7.8 7.2 7.6  HGB 8.4* 8.1* 7.3*  HCT 27.3* 26.6* 24.6*  MCV 68.4* 68.6* 69.3*  PLT 353 348 563   Basic Metabolic Panel: Recent Labs  Lab 04/19/18 1418 04/20/18 0414 04/21/18 0426  NA  --  135 138  K  --  4.2 3.8  CL  --  114* 115*  CO2  --  15* 17*  GLUCOSE  --  102* 94  BUN  --  15 11  CREATININE 1.83* 1.78* 1.33*  CALCIUM  --  8.6* 8.7*   GFR: Estimated Creatinine Clearance: 87.2 mL/min (A) (by C-G formula based on SCr of 1.33 mg/dL (H)). Liver Function Tests: No results for input(s): AST, ALT, ALKPHOS, BILITOT, PROT, ALBUMIN in the last 168 hours. No results for input(s): LIPASE, AMYLASE in the last 168 hours. No results for input(s): AMMONIA in the last 168 hours. Coagulation Profile: No results for input(s): INR, PROTIME in the last 168 hours. Cardiac Enzymes: No results for input(s): CKTOTAL, CKMB, CKMBINDEX, TROPONINI in the last 168 hours. BNP (last 3 results) No results for input(s): PROBNP in the last 8760 hours. HbA1C: No results for input(s): HGBA1C in the last 72 hours. CBG: No results for input(s): GLUCAP in the last 168 hours. Lipid Profile: No results for input(s): CHOL, HDL, LDLCALC, TRIG, CHOLHDL, LDLDIRECT in the last 72 hours. Thyroid Function Tests: No results for input(s): TSH, T4TOTAL, FREET4, T3FREE, THYROIDAB in the last 72 hours. Anemia Panel: Recent Labs    04/20/18 0414  VITAMINB12 395   Urine analysis:    Component Value Date/Time   COLORURINE YELLOW 04/20/2018 0859   APPEARANCEUR HAZY (A) 04/20/2018 0859   LABSPEC 1.015 04/20/2018 0859   PHURINE 5.0 04/20/2018 0859   GLUCOSEU NEGATIVE 04/20/2018 0859   HGBUR NEGATIVE 04/20/2018 0859   BILIRUBINUR NEGATIVE 04/20/2018 0859   KETONESUR NEGATIVE 04/20/2018 0859   PROTEINUR 30 (A) 04/20/2018 0859   UROBILINOGEN 0.2 07/22/2015 0405   NITRITE NEGATIVE 04/20/2018 0859   LEUKOCYTESUR NEGATIVE 04/20/2018 0859    Sepsis Labs: @LABRCNTIP (procalcitonin:4,lacticidven:4)  )No results found for this or any previous visit (from the past 240 hour(s)).       Radiology Studies: No results found.      Scheduled Meds: . feeding supplement (ENSURE ENLIVE)  237 mL Oral Q24H  . feeding supplement (PRO-STAT SUGAR FREE 64)  30 mL Oral TID  . Ganciclovir  1 drop Both Eyes 5 X Daily  . heparin  5,000 Units Subcutaneous Q8H  . neomycin-polymyxin b-dexamethasone  1 drop Both Eyes 5 X Daily   Continuous Infusions: . sodium chloride 125 mL/hr at 04/22/18 1502     LOS: 3 days    Roxan Hockey, MD [facility]  If 7PM-7AM, please contact night-coverage [email or web address] Password [facility] 04/22/2018, 6:46 PM

## 2018-04-22 NOTE — Progress Notes (Signed)
Occupational Therapy Treatment Patient Details Name: Brandin Dilday MRN: [id number] DOB: 06/20/1981 Today's Date: 04/22/2018    History of present illness 37yo female with history of ulcers in groin since March which have gotten worse and started draining in the past 2 weeks and have become very painful. PMH anxiety, asthma, CHF, HTN, morbid obesity, neuropathy, OSA, sickle cell anemia, Chrons s/p colectomy and colostomy    OT comments  Pt motivated and willing to work through pain.  All activities take extra time  Follow Up Recommendations  SNF(vs home with assist for mobility and adls)    Equipment Recommendations  3 in 1 bedside commode(wide)    Recommendations for Other Services      Precautions / Restrictions Precautions Precautions: Other (comment) Precaution Comments: colostomy bag RUQ  Restrictions Weight Bearing Restrictions: No       Mobility Bed Mobility         Supine to sit: Min guard(extra time) Sit to supine: Mod assist;+2 for physical assistance   General bed mobility comments: pt requested assistance for both legs for back to bed due to increased pain.  Managed OOB with extra time, HOB raised and use of bedrails  Transfers   Equipment used: Rolling walker (2 wheeled)   Sit to Stand: Min guard;+2 safety/equipment              Balance                                           ADL either performed or assessed with clinical judgement   ADL Overall ADL's : Needs assistance/impaired                         Toilet Transfer: Min guard;+2 for safety/equipment;RW(bed; simulated)   Toileting- Clothing Manipulation and Hygiene: Moderate assistance;Maximal assistance;Sit to/from stand;Bed level         General ADL Comments: Pt has catheter; simulated toilet transfer. She did need to have depends garment/maxi pads changed and perform hygiene. Pain and body habitus interfere. Pt ambulated in hall with min guard +2 safety.      Vision       Perception     Praxis      Cognition Arousal/Alertness: Awake/alert Behavior During Therapy: WFL for tasks assessed/performed Overall Cognitive Status: Within Functional Limits for tasks assessed                                          Exercises     Shoulder Instructions       General Comments      Pertinent Vitals/ Pain       Pain Score: 7  Pain Location: wound sites  Pain Descriptors / Indicators: Aching;Sore Pain Intervention(s): Limited activity within patient's tolerance;Monitored during session;Repositioned  Home Living                                          Prior Functioning/Environment              Frequency  Min 2X/week        Progress Toward Goals  OT Goals(current goals can now be found in the care plan  section)  Progress towards OT goals: Progressing toward goals     Plan      Co-evaluation    PT/OT/SLP Co-Evaluation/Treatment: Yes Reason for Co-Treatment: For patient/therapist safety PT goals addressed during session: Mobility/safety with mobility OT goals addressed during session: ADL's and self-care      AM-PAC PT "6 Clicks" Daily Activity     Outcome Measure   Help from another person eating meals?: None Help from another person taking care of personal grooming?: A Little Help from another person toileting, which includes using toliet, bedpan, or urinal?: A Lot Help from another person bathing (including washing, rinsing, drying)?: A Lot Help from another person to put on and taking off regular upper body clothing?: A Lot Help from another person to put on and taking off regular lower body clothing?: Total 6 Click Score: 14    End of Session    OT Visit Diagnosis: Unsteadiness on feet (R26.81);Muscle weakness (generalized) (M62.81)   Activity Tolerance Patient tolerated treatment well   Patient Left in bed;with call bell/phone within reach;with nursing/sitter in  room   Nurse Communication          Time: 1740-9927 OT Time Calculation (min): 46 min  Charges: OT General Charges $OT Visit: 1 Visit OT Treatments $Self Care/Home Management : 8-22 mins $Therapeutic Activity: 8-22 mins  [name], OTR/L [phone] 04/22/2018   Abdimalik Mayorquin 04/22/2018, 11:27 AM

## 2018-04-22 NOTE — Progress Notes (Signed)
Consult-SNF Plan: Home Health.  CSW will sign off.   Kathrin Greathouse, Marlinda Mike, MSW Clinical Social Worker  [phone] 04/22/2018  11:17 AM

## 2018-04-22 NOTE — Progress Notes (Signed)
Physical Therapy Treatment Patient Details Name: Lorae Roig MRN: [id number] DOB: January 17, 1981 Today's Date: 04/22/2018    History of Present Illness 37yo female with history of ulcers in groin since March which have gotten worse and started draining in the past 2 weeks and have become very painful. PMH anxiety, asthma, CHF, HTN, morbid obesity, neuropathy, OSA, sickle cell anemia, Chrons s/p colectomy and colostomy     PT Comments    Pt cooperative but mobility is pain limited and requires increased time for all tasks.   Follow Up Recommendations  Home health PT;Other (comment)     Equipment Recommendations  Rolling walker with 5" wheels;3in1 (PT);Other (comment)(Wide RW - pt is 5'2 and 360 lbs)    Recommendations for Other Services       Precautions / Restrictions Precautions Precautions: Other (comment) Precaution Comments: colostoy bag RUQ Restrictions Weight Bearing Restrictions: No    Mobility  Bed Mobility Overal bed mobility: Needs Assistance Bed Mobility: Supine to Sit;Sit to Supine     Supine to sit: Min guard Sit to supine: Mod assist;+2 for physical assistance   General bed mobility comments: Pt unassisted to EOB but with increased time.  pt requested assistance for both legs for back to bed due to increased pain.  Managed OOB with extra time, HOB raised and use of bedrails  Transfers Overall transfer level: Needs assistance Equipment used: Rolling walker (2 wheeled) Transfers: Sit to/from Stand Sit to Stand: Min guard;+2 safety/equipment         General transfer comment: Increased time but no physical assist  Ambulation/Gait Ambulation/Gait assistance: Min guard Gait Distance (Feet): 60 Feet Assistive device: Rolling walker (2 wheeled) Gait Pattern/deviations: Step-through pattern;Decreased step length - right;Decreased step length - left;Shuffle;Trunk flexed;Wide base of support Gait velocity: decr   General Gait Details: Increased time with cues  for posture and position from RW.     Stairs             Wheelchair Mobility    Modified Rankin (Stroke Patients Only)       Balance Overall balance assessment: Mild deficits observed, not formally tested                                          Cognition Arousal/Alertness: Awake/alert Behavior During Therapy: WFL for tasks assessed/performed Overall Cognitive Status: Within Functional Limits for tasks assessed                                        Exercises      General Comments        Pertinent Vitals/Pain Pain Assessment: 0-10 Pain Score: 7  Pain Location: wound sites  Pain Descriptors / Indicators: Aching;Sore Pain Intervention(s): Limited activity within patient's tolerance;Monitored during session;Premedicated before session    Home Living                      Prior Function            PT Goals (current goals can now be found in the care plan section) Acute Rehab PT Goals PT Goal Formulation: With patient Time For Goal Achievement: 05/04/18 Potential to Achieve Goals: Good Progress towards PT goals: Progressing toward goals    Frequency    Min 3X/week      PT  Plan Current plan remains appropriate    Co-evaluation PT/OT/SLP Co-Evaluation/Treatment: Yes Reason for Co-Treatment: For patient/therapist safety PT goals addressed during session: Mobility/safety with mobility OT goals addressed during session: ADL's and self-care      AM-PAC PT "6 Clicks" Daily Activity  Outcome Measure  Difficulty turning over in bed (including adjusting bedclothes, sheets and blankets)?: A Lot Difficulty moving from lying on back to sitting on the side of the bed? : A Lot Difficulty sitting down on and standing up from a chair with arms (e.g., wheelchair, bedside commode, etc,.)?: A Little Help needed moving to and from a bed to chair (including a wheelchair)?: A Little Help needed walking in hospital room?: A  Little Help needed climbing 3-5 steps with a railing? : A Lot 6 Click Score: 15    End of Session   Activity Tolerance: Patient tolerated treatment well;Patient limited by pain Patient left: in bed;with call bell/phone within reach;with nursing/sitter in room Nurse Communication: Mobility status PT Visit Diagnosis: Muscle weakness (generalized) (M62.81);Difficulty in walking, not elsewhere classified (R26.2);Dizziness and giddiness (R42)     Time: 3832-9191 PT Time Calculation (min) (ACUTE ONLY): 39 min  Charges:  $Gait Training: 8-22 mins                     Pg [phone]    Darothy Courtright 04/22/2018, 12:22 PM

## 2018-04-22 NOTE — Progress Notes (Signed)
Patient Demographics:    Dominique [name], is a 37 y.o. female, DOB - 18-Dec-1980, OYD:[id number]  Admit date - 04/19/2018   Admitting Physician Debbe Odea, MD  Outpatient Primary MD for the patient is Benito Mccreedy, MD  LOS - 3   Chief Complaint  Patient presents with  . Hip Pain        Subjective:    Dominique [name] today has no fevers, no emesis,  No chest pain,  Resting comfortably, admits to depressed mood, admits to anhedonia and poor sleep, no suicidal homicidal ideation or plan     Assessment  & Plan :    Principal Problem:   Multiple wounds of skin Active Problems:   Morbid obesity- BMI 67.5   Hepatic steatosis   Crohn's colitis with perforation s/p left colectomy/colostomy 2016   OSA on CPAP   Adjustment disorder with mixed anxiety and depressed mood   Sickle cell trait (HCC)   AKI (acute kidney injury) ([place])   Abnormal TSH   Redness of both eyes   Eye pain, right  Brief Narrative:  37 yo female with a PMH significant for CAD, CHF, Crohn's s/p colectomy and colostomy 01/14/18, sickle cell trait with chronic anemia, morbid obesity, and hepatic steatosis is here with complaints of ongoing complications with inguinal skin breaks as well as anal cleft fissures. The patient is afebrile and wounds are showing no signs of infection. CBC shows anemia to her baseline with no elevated WBCs. Due to new onset left eye blurred vision 2/2 to Crohn's complications and worsening chronic wounds, the patient was admitted for additional monitoring, IV fluids, and wound management. Wound Care is following and recommends absorbant dressings and Silvadene for pain and antimicrobial management.   Other problems: new acute left eye blurred vision. Endorses right eye blurred vision since March. Could be complications 2/2 to her Crohn's Disease. ED did a thorough exam and noted the following: "Right eye shows  corneal ulcer and fluorescein uptake. No proptosis or hyphema. Normal EOMs. No periorbital edema or erythema. Slightly purulent discharge from bilateral eyes. Corneal defect on the right with fluorescein uptake, suspicious for ulcer. No dendritic staining. Negative Seidel's sign. 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 Since the early part of this month both eyes have turned red.  She's tried allergy medications with no improvement.  She's had right eye pain that she thought was associated with a headache for several weeks.  Her left eye vision has become more blurry this month as well.    Plan  Peri inguinal and perigluteal wounds, stable -looks clean, no sepsis - follow wound care recommendations -unclear nidus but her body habitus has limited wound healing -judicious pain control(oxy 10 mg q4H PRN,), discontinued IV morphine - discontinued Toradol given AKI ( though suspect prerenal)  The patient would follow up with wound care for her scheduled appointment 05/01/18   Unlikely to be Behcet's disease and unlikely to be  hidradenitis suppurativa, clinically favor extra intestinal manifestations of Crohn's disease    Crohn's disease, s/p colectomy with colostomy (2016), stable -no active signs of intra-intestinal disease -  has been off Remicade for several months ( was supposed to resume this year)  AKI, suspect pre-renal etiology, improving -creatinine on admission of 1.8-- 1.78--1.3 (baseline less than 1).  -Suspect prerenal given admitted decreased p.o. intake due to pain and improvement with scheduled IV fluids and increase hydration - monitor output via foley - Ua unremarkable -Continue IV fluids at 125 cc/h, x12 more hours - repeat BMP in am, if worsens will obtain renal ultrasound and consult nephrology   Bilateral eye redness, worsening vision -Last  evaluated by [facility] 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 Gabriel Earing (ophthalmology) recommendations,started Maxitrol and Zirgan drops -Continue follow-up as outpatient ??? Uveitis--- Question if it is 2/2 to extraintestinal complications from her Crohn's. The patient should follow up with her GI doctor outpatient.   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 Not concerning for hyperthyrotoxicosis. 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  Social worker arranging home health wound care.    DVT prophylaxis: Heparin SQ Code Status: Full Family Communication: No family at bedside during interview Disposition Plan: Patient was seen by Wound Care and they recommend her dressings be changed to absorbent dressings and Silvadene be used for pain and antimicrobial management. At this time, the patient has been evaluated by PT who believes she could benefit from in-home aid to help with wound cleaning and dressings. The patient should follow up with wound care for her scheduled appointment 05/01/18.    Lab Results  Component Value Date   PLT  356 04/21/2018    Inpatient Medications  Scheduled Meds: . feeding supplement (ENSURE ENLIVE)  237 mL Oral Q24H  . feeding supplement (PRO-STAT SUGAR FREE 64)  30 mL Oral TID  . Ganciclovir  1 drop Both Eyes 5 X Daily  . heparin  5,000 Units Subcutaneous Q8H  . neomycin-polymyxin b-dexamethasone  1 drop Both Eyes 5 X Daily   Continuous Infusions: . sodium chloride 125 mL/hr at 04/22/18 1502   PRN Meds:.acetaminophen **OR** acetaminophen, oxyCODONE    Anti-infectives (From admission, onward)   Start     Dose/Rate Route Frequency Ordered Stop   04/20/18 1845  valACYclovir (VALTREX) tablet 1,000 mg     1,000 mg Oral 3 times daily 04/20/18 1839 04/20/18 1953        Objective:   Vitals:   04/21/18 0452 04/21/18 1445 04/21/18 2137 04/22/18 1347  BP: 121/64 97/81 (!) 143/69 118/62  Pulse: (!) 106 100 98   Resp: 20 16 20 16   Temp: 98.9 F (37.2 C) 98.6 F (37 C) 98.9 F (37.2 C) 98.9 F (37.2 C)  TempSrc: Oral Oral Oral Oral  SpO2: 98% 93% 98%   Weight:      Height:  Wt Readings from Last 3 Encounters:  04/18/18 (!) 163.3 kg (360 lb)  04/04/18 (!) 165.1 kg (364 lb)  02/05/18 (!) 166.5 kg (367 lb)     Intake/Output Summary (Last 24 hours) at 04/22/2018 1854 Last data filed at 04/22/2018 1804 Gross per 24 hour  Intake 4244.03 ml  Output 2750 ml  Net 1494.03 ml     Physical Exam General exam: Appears calm and comfortable , morbidly obese Skin: bilateral inguinal breaks in the skin without drainage or pus, no signs of infection, and TTP. Multiple small 1 cm lesions on pubis region. 2 anal fissures along cleft - no drainage or pus, no signs of infection, and TTP. Orbitals: Normal EOMs. Bilateral corneal conjunctivitis with purulent discharge.  Respiratory system: Clear to auscultation. Respiratory effort normal. Cardiovascular system: S1 & S2 heard, RRR. No JVD, murmurs, rubs, gallops or clicks. No pedal edema. Gastrointestinal system: Abdomen is nondistended,  soft and nontender. No organomegaly or masses felt. Normal bowel sounds heard. Colostomy area is benign in appearance. No signs of infection, nontender.  Central nervous system: Alert and oriented. No focal neurological deficits. Extremities: Symmetric 5 x 5 power. Psychiatry: Judgement and insight appear normal. Mood & affect appropriate.      Data Review:   Micro Results No results found for this or any previous visit (from the past 240 hour(s)).  Radiology Reports Dg Chest 2 View  Result Date: 04/04/2018 CLINICAL DATA:  Patient arrived via EMS, from home, per EMS patient complaining of chest pain that radiates across shoulders, ems gave 325 aspirin with relief. Also c/o SOB pta. In triage no c/o of sob or cp, lightheadeness and "pressure in my right eye". Hx of asthma and chron's disease. VSS in triage and no acute distress noted. Pt also complaining of pain in her perineum and rectum-states dr diagnosed her with anal fissures EXAM: CHEST - 2 VIEW COMPARISON:  06/14/2015 FINDINGS: The heart size and mediastinal contours are within normal limits. Both lungs are clear. No pleural effusion or pneumothorax. The visualized skeletal structures are unremarkable. IMPRESSION: No active cardiopulmonary disease. Electronically Signed   By: Lajean Manes M.D.   On: 04/04/2018 21:48     CBC Recent Labs  Lab 04/19/18 1418 04/20/18 0525 04/21/18 0426  WBC 7.8 7.2 7.6  HGB 8.4* 8.1* 7.3*  HCT 27.3* 26.6* 24.6*  PLT 353 348 356  MCV 68.4* 68.6* 69.3*  MCH 21.1* 20.9* 20.6*  MCHC 30.8 30.5 29.7*  RDW 19.8* 19.8* 19.9*    Chemistries  Recent Labs  Lab 04/19/18 1418 04/20/18 0414 04/21/18 0426  NA  --  135 138  K  --  4.2 3.8  CL  --  114* 115*  CO2  --  15* 17*  GLUCOSE  --  102* 94  BUN  --  15 11  CREATININE 1.83* 1.78* 1.33*  CALCIUM  --  8.6* 8.7*   ------------------------------------------------------------------------------------------------------------------ No results for  input(s): CHOL, HDL, LDLCALC, TRIG, CHOLHDL, LDLDIRECT in the last 72 hours.  Lab Results  Component Value Date   HGBA1C 5.3 04/19/2018   ------------------------------------------------------------------------------------------------------------------ No results for input(s): TSH, T4TOTAL, T3FREE, THYROIDAB in the last 72 hours.  Invalid input(s): FREET3 ------------------------------------------------------------------------------------------------------------------ Recent Labs    04/20/18 0414  VITAMINB12 395    Coagulation profile No results for input(s): INR, PROTIME in the last 168 hours.  No results for input(s): DDIMER in the last 72 hours.  Cardiac Enzymes No results for input(s): CKMB, TROPONINI, MYOGLOBIN in the last 168 hours.  Invalid input(s): CK ------------------------------------------------------------------------------------------------------------------ No results found for: BNP   Roxan Hockey M.D on 04/22/2018 at 6:54 PM   Go to [email or web address] - password [email or web address] for contact info  [facility] - Office  [phone]

## 2018-04-23 LAB — CBC
HCT: 25.2 % — ABNORMAL LOW (ref 36.0–46.0)
HEMOGLOBIN: 7.6 g/dL — AB (ref 12.0–15.0)
MCH: 20.9 pg — ABNORMAL LOW (ref 26.0–34.0)
MCHC: 30.2 g/dL (ref 30.0–36.0)
MCV: 69.2 fL — ABNORMAL LOW (ref 78.0–100.0)
Platelets: 373 10*3/uL (ref 150–400)
RBC: 3.64 MIL/uL — AB (ref 3.87–5.11)
RDW: 20.1 % — ABNORMAL HIGH (ref 11.5–15.5)
WBC: 6.4 10*3/uL (ref 4.0–10.5)

## 2018-04-23 LAB — BASIC METABOLIC PANEL
Anion gap: 5 (ref 5–15)
BUN: 6 mg/dL (ref 6–20)
CHLORIDE: 115 mmol/L — AB (ref 98–111)
CO2: 20 mmol/L — ABNORMAL LOW (ref 22–32)
CREATININE: 1.05 mg/dL — AB (ref 0.44–1.00)
Calcium: 8.6 mg/dL — ABNORMAL LOW (ref 8.9–10.3)
GFR calc non Af Amer: >60 mL/min (ref >60)
Glucose, Bld: 98 mg/dL (ref 70–99)
POTASSIUM: 3.8 mmol/L (ref 3.5–5.1)
SODIUM: 140 mmol/L (ref 135–145)

## 2018-04-23 MED ORDER — GANCICLOVIR 0.15 % OP GEL: 1.0000 [drp] | Freq: Every day | OPHTHALMIC | 0 refills | Status: DC

## 2018-04-23 MED ORDER — DULOXETINE HCL 20 MG PO CPEP: 20.0000 mg | ORAL_CAPSULE | Freq: Two times a day (BID) | ORAL | 5 refills | Status: DC

## 2018-04-23 MED ORDER — FERROUS SULFATE 325 (65 FE) MG PO TABS: ORAL_TABLET | ORAL | 3 refills | Status: DC

## 2018-04-23 MED ORDER — PROAIR HFA 108 (90 BASE) MCG/ACT IN AERS: 2.0000 | INHALATION_SPRAY | Freq: Four times a day (QID) | RESPIRATORY_TRACT | 2 refills | Status: DC | PRN

## 2018-04-23 MED ORDER — FLUCONAZOLE 200 MG PO TABS: 200.0000 mg | ORAL_TABLET | Freq: Every day | ORAL | 0 refills | Status: DC

## 2018-04-23 MED ORDER — OXYCODONE-ACETAMINOPHEN 5-325 MG PO TABS: 1.0000 | ORAL_TABLET | Freq: Four times a day (QID) | ORAL | 0 refills | Status: DC | PRN

## 2018-04-23 MED ORDER — NEOMYCIN-POLYMYXIN-DEXAMETH 3.5-10000-0.1 OP SUSP: 1.0000 [drp] | Freq: Every day | OPHTHALMIC | 0 refills | Status: DC

## 2018-04-23 NOTE — Progress Notes (Signed)
Discharge and medication instructions reviewed with patient. Questions answered. Patient denies further questions. Five prescriptions given to patient. Patient declines dressing change prior to leaving, stating "I am going to go home and shower then do dressings". Dressing supplies given to patient. Patient requested pain medication for ride home; given. Patient is going home via taxi cab. Donne Hazel, RN

## 2018-04-23 NOTE — Discharge Instructions (Signed)
1)Apply silver hydrofiber dressing at least once a day to abdominal and inguinal wounds.  May not need tape to hold in place could use skin folds to hold if needed to limit exposure to tape.  Wound type: Intertriginous skin damage,  2) follow-up with dermatologist as outpatient in 1 to 2 weeks  3)Follow-up with gastroenterologist as outpatient in 1 to 2 weeks  4)Repeat CBC/complete blood count as well as BMP/basic metabolic profile blood test in about 1 week--- you may need blood transfusion in the near future  5) take all other medications as prescribed  6) increased activity/mobility advised daily  7) follow-up with ophthalmologist as scheduled in 1 to 2 weeks  Apply switch to silver hydrofiber dressing at least once a day to abdominal and inguinal wounds.  May not need tape to hold in place could use skin folds to hold if needed to limit exposure to tape.  Wound type: Intertriginous skin damage,   Change your dressings at least once per day.  Continue follow-up with wound care.  You may take Percocet as prescribed for pain control if your pain persists despite ibuprofen or Aleve.  A consult has been placed to care management to try and facilitate a home health nurse to assist with wound care in your home.  Continue scheduled follow-up with the wound care center as well.  We further recommend close follow-up with ophthalmology.  Call Monday morning to schedule an appointment.  Use erythromycin ointment as prescribed until follow-up.  We continue to encourage follow-up with your primary care doctor, especially regarding ongoing pain control.  You may return for new or concerning symptoms.

## 2018-04-23 NOTE — Progress Notes (Signed)
OT Cancellation Note  Patient Details Name: Sharlotte Baka MRN: [id number] DOB: [birth year]   Cancelled Treatment:    Reason Eval/Treat Not Completed: Other (comment).  Pt is hoping for d/c home today.  She did not feel up to getting OOB.  If she remains in the hospital, I'll try to check back tomorrow.  Kinzleigh Kandler 04/23/2018, 12:18 PM  [name], OTR/L [phone] 04/23/2018

## 2018-04-23 NOTE — Discharge Summary (Signed)
Dominique [name], is a 37 y.o. female  DOB 06/11/1981  MRN [id number].  Admission date:  04/19/2018  Admitting Physician  Debbe Odea, MD  Discharge Date:  04/23/2018   Primary MD  Benito Mccreedy, MD  Recommendations for primary care physician for things to follow:   1)Apply silver hydrofiber dressing at least once a day to abdominal and inguinal wounds.  May not need tape to hold in place could use skin folds to hold if needed to limit exposure to tape.  Wound type: Intertriginous skin damage,  2) follow-up with dermatologist as outpatient in 1 to 2 weeks  3)Follow-up with gastroenterologist as outpatient in 1 to 2 weeks  4)Repeat CBC/complete blood count as well as BMP/basic metabolic profile blood test in about 1 week--- you may need blood transfusion in the near future  5) take all other medications as prescribed  6) increased activity/mobility advised daily  7) follow-up with ophthalmologist as scheduled in 1 to 2 weeks  Apply switch to silver hydrofiber dressing at least once a day to abdominal and inguinal wounds.  May not need tape to hold in place could use skin folds to hold if needed to limit exposure to tape.  Wound type: Intertriginous skin damage,   Change your dressings at least once per day.  Continue follow-up with wound care.  You may take Percocet as prescribed for pain control if your pain persists despite ibuprofen or Aleve.  A consult has been placed to care management to try and facilitate a home health nurse to assist with wound care in your home.  Continue scheduled follow-up with the wound care center as well.  We further recommend close follow-up with ophthalmology.  Call Monday morning to schedule an appointment.  Use erythromycin ointment as prescribed until follow-up.  We continue to encourage follow-up with your primary care doctor, especially regarding ongoing pain control.   You may return for new or concerning symptoms.   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 Acute acalculous cholecystitis 11/16/2014  . Anal fistula   . Anemia   . Anxiety   . Asthma   . CHF (congestive heart failure) ([place])   . Complication of anesthesia    states she had a problem staying awake after her c-section, was transferred from [facility]  to [facility]  . Coronary artery disease   . Diverticulitis of colon 11/17/2014  . Headache    used to have migraines  . Hepatic steatosis 11/17/2014  . Herpes   . Hypertension   . Morbid obesity ([place]) 03/22/2009   Qualifier: Diagnosis of  By: Ronnald Ramp CMA, [place]    . Neuropathy   . OSA (obstructive sleep apnea) 05/02/2014  . Seasonal allergies 06/13/2014  . Sickle cell anemia ([place])    She states she " has the trait" (07/20/15)  . Sleep apnea    uses Bipap  . Thyromegaly 05/02/2014    Past Surgical History:  Procedure Laterality Date  . c section 2011  2011  . COLONOSCOPY WITH PROPOFOL N/A 12/27/2015   Procedure: COLONOSCOPY WITH PROPOFOL;  Surgeon: Wonda Horner, MD;  Location: [facility] ENDOSCOPY;  Service: Endoscopy;  Laterality: N/A;  . COLOSTOMY N/A 06/11/2015   Procedure:  CREATION OF COLOSTOMY;  Surgeon: Georganna Skeans,  MD;  Location: [place];  Service: General;  Laterality: N/A;  . ESOPHAGOGASTRODUODENOSCOPY N/A 07/19/2015   Procedure: ESOPHAGOGASTRODUODENOSCOPY (EGD);  Surgeon: Clarene [place], MD;  Location: [facility] ENDOSCOPY;  Service: Endoscopy;  Laterality: N/A;  . FLEXIBLE SIGMOIDOSCOPY N/A 06/05/2015   Procedure: FLEXIBLE SIGMOIDOSCOPY;  Surgeon: Ronald Lobo, MD;  Location: [facility] ENDOSCOPY;  Service: Endoscopy;  Laterality: N/A;  . LAPAROTOMY N/A 06/09/2015   Procedure: EXPLORATORY LAPAROTOMY, DRAINAGE OF INTRAABDOMINAL ABSCESS, PARTIAL COLON RESECTION,  APPLICATION OF WOUND VAC;  Surgeon: Georganna Skeans, MD;  Location: [place];  Service: General;  Laterality: N/A;  . LAPAROTOMY N/A 06/11/2015   Procedure: RE-EXPLORATION OF ABDOMEN;  Surgeon: Georganna Skeans, MD;  Location: [place];  Service: General;  Laterality: N/A;       HPI  from the history and physical done on the day of admission:     Patient coming from: home  Chief Complaint: ulcers and pain  HPI: Dominique [name] is a 37 y.o. female with medical history of Crohn's disease s/p colectomy and colostomy, sickle cell trait, Morbid obesity and hepatic steatosis who has had ulcers in her groin since March. She states these ulcers have gotten much worse and have been draining for the past 2 wks. Pain has been severe and she has mostly laid in the bed for the past 2 wks. She has tried the following: Doxycycline, Diflucan and Tramadol but still is having drainage. She only had 15 Tramadol tabs and since they have run out she has been taking Tylenol and Motrin for the pain.  She has been applying Vaseline and Lidocaine and keeping the ulcers covered with toilet paper and tissues due to the excess drainage. She is currently crying and asking for more pain medications.  She also has an ulcer in her gluteal cleft and ulcers in the skin under her pannus.  She has not had any fevers or chills.   ED Course: given a number of pain meds including Morphine and Percocets    Hospital Course:    Brief Narrative: 37 yo female with a PMH significant for CAD, CHF, Crohn's s/p colectomy and colostomy 01/14/18, sickle cell trait with chronic anemia, morbid obesity, and hepatic steatosis is here with complaints of ongoing complications with inguinal skin breaks as well as anal cleft fissures. The patient is afebrile and wounds are showing no signs of infection. CBC shows anemia to her baseline with no elevated WBCs. Due to new onset left eye blurred vision 2/2 to Crohn's complications and worsening chronic wounds, the patient was admitted for additional  monitoring, IV fluids, and wound management. Wound Care is following and recommends absorbant dressings and Silvadene for pain and antimicrobial management.   Other problems: new acute left eye blurred vision. Endorses right eye blurred vision since March. Could be complications 2/2 to her Crohn's Disease. ED did a thorough exam and noted the following: "Right eye shows corneal ulcer and fluorescein uptake. No proptosis or hyphema. Normal EOMs. No periorbital edema or erythema. Slightly purulent discharge from bilateral eyes. Corneal defect on the right with fluorescein uptake, suspicious for ulcer. No dendritic staining. Negative Seidel's sign. 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 Since the early part of this month both eyes have turned red. She's tried allergy medications with no improvement. She's had right eye pain that she thought was associated with a headache for several weeks. Her left eye vision has become more blurry this month as well.   Plan  1)Peri -inguinal and Perigluteal wounds, stable-  -looks clean, no sepsis - follow wound care recommendations -unclear nidus but her body habitus has limited wound healing -judicious pain control(oxy 10 mg q4H PRN,),  Wound Care and they recommend  her dressings be changed to absorbent dressings and Silvadene be used for pain and antimicrobial management. The patient would follow up with wound care for her scheduled appointment 05/01/18  Unlikely to be Behcet's disease and unlikely to behidradenitis suppurativa,clinically favor extra intestinal manifestations of Crohn's disease   2)Crohn's disease, s/p colectomy with colostomy (2016), stable -no active signs of intra-intestinal disease -has been off Remicade for several months ( was supposed to resume this year)  3)AKI, suspect pre-renal etiology, improving -creatinine on admission of 1.8-- 1.78--1.3 (baseline less than 1).,  imProved with hydration creatinine is back down to 1 -Suspect prerenal given admitted decreased p.o. intake due to pain and improvement with scheduled IV fluids and increase hydration - Ua unremarkable  4)Bilateral eye redness, worsening vision- -Last evaluated by Dr. 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 Gabriel Earing (ophthalmology) recommendations, started Maxitrol and Zirgan drops ??? Uveitis---Question if it is 2/2 to extraintestinal complications from her Crohn's. The patient should follow up with her GI doctor outpatient. 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 Not concerning for hyperthyrotoxicosis. 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  The patient should follow up with wound care for her scheduled appointment 05/01/18.   Social worker arranging home health wound care.   Discharge Condition: STABLE  Follow UP  Follow-up Information    Schedule an appointment as soon as possible for a visit  with  Benito Mccreedy, MD.   Specialty:  Internal Medicine Why:  For re-check Contact information: [address] [place] [address] [phone]        Schedule an appointment as soon as possible for a visit  with Gretchen Short, DO.   Specialty:  Ophthalmology Contact information: [place] [address] [place] DEPT.   Specialty:  Emergency Medicine Why:  As needed, if symptoms worsen Contact information: [place] [id number] [place], [facility] Follow up.   Specialty:  Home Health Services Why:  nurse to assist with wound care Contact information: [place] [address] [phone]           Diet and Activity recommendation:  As advised  Discharge Instructions    Discharge Instructions    Call MD for:  difficulty breathing, headache or visual disturbances   Complete by:  As directed    Call MD for:  hives   Complete by:  As directed    Call MD for:  persistant dizziness or light-headedness   Complete by:  As directed    Call MD for:  persistant nausea and vomiting   Complete by:  As directed    Call MD for:  redness, tenderness, or signs of infection (pain, swelling, redness,  odor or green/yellow discharge around incision site)   Complete by:  As directed    Call MD for:  severe uncontrolled pain   Complete by:  As directed    Call MD for:  temperature >100.4   Complete by:  As directed    Diet - low sodium heart healthy   Complete by:  As directed    Discharge instructions   Complete by:  As directed    1)Apply silver hydrofiber dressing at least once a day to abdominal and inguinal wounds.  May not need tape to hold in place could use skin folds to hold if needed to limit exposure to tape.  Wound type: Intertriginous skin damage,  2) follow-up with dermatologist as outpatient in 1 to 2 weeks  3)Follow-up with gastroenterologist as outpatient in 1 to 2 weeks  4)Repeat CBC/complete blood count as well as BMP/basic metabolic profile blood test in about 1 week--- you may need blood transfusion in the near future  5) take all other medications as prescribed  6) increased activity/mobility advised daily  7) follow-up with ophthalmologist as scheduled in 1 to 2 weeks   Increase activity slowly   Complete by:  As directed         Discharge Medications     Allergies as of 04/23/2018      Reactions   Other Shortness Of Breath, Swelling   Tree nuts   Penicillins Other (See Comments)   Unknown childhood allergy Has patient had a PCN reaction causing immediate rash, facial/tongue/throat swelling, SOB or lightheadedness with hypotension: Unknown Has patient had a PCN reaction causing severe rash involving mucus membranes or skin necrosis: Unknown Has patient had a PCN reaction that required hospitalization: Unknown Has patient had a PCN reaction occurring within the last 10 years: Unknown If all of the above answers are "NO", then may proceed with Cephalosporin use.      Medication List    STOP taking these medications   doxycycline 100 MG capsule Commonly known as:  VIBRAMYCIN   ibuprofen 200 MG tablet  Commonly known as:  ADVIL,MOTRIN     metoCLOPramide 10 MG tablet Commonly known as:  REGLAN   traMADol 50 MG tablet Commonly known as:  ULTRAM     TAKE these medications   acetaminophen 500 MG tablet Commonly known as:  TYLENOL Take 1,000 mg by mouth every 6 (six) hours as needed for mild pain.   DULoxetine 20 MG capsule Commonly known as:  CYMBALTA Take 1 capsule (20 mg total) by mouth 2 (two) times daily.   ferrous sulfate 325 (65 FE) MG tablet 1 tablet twice a day with meals   fluconazole 200 MG tablet Commonly known as:  DIFLUCAN Take 1 tablet (200 mg total) by mouth daily. What changed:  medication strength   Ganciclovir 0.15 % Gel Commonly known as:  ZIRGAN Place 1 drop into both eyes 5 (five) times daily.   neomycin-polymyxin b-dexamethasone 3.5-10000-0.1 Susp Commonly known as:  MAXITROL Place 1 drop into both eyes 5 (five) times daily.   oxyCODONE-acetaminophen 5-325 MG tablet Commonly known as:  PERCOCET/ROXICET Take 1 tablet by mouth every 6 (six) hours as needed for severe pain.   PROAIR HFA 108 (90 Base) MCG/ACT inhaler Generic drug:  albuterol Inhale 2 puffs into the lungs 4 (four) times daily as needed for wheezing or shortness of breath.       Major procedures and Radiology Reports - PLEASE review detailed and final reports for all details, in brief -    Dg Chest 2 View  Result Date: 04/04/2018 CLINICAL DATA:  Patient arrived via EMS, from home, per EMS patient complaining of chest pain that radiates across shoulders, ems gave 325 aspirin with relief. Also c/o SOB pta. In triage no c/o of sob or cp, lightheadeness and "pressure in my right eye". Hx of asthma and chron's disease. VSS in triage and no acute distress noted. Pt also complaining of pain in her perineum and rectum-states dr diagnosed her with anal fissures EXAM: CHEST - 2 VIEW COMPARISON:  06/14/2015 FINDINGS: The heart size and mediastinal contours are within normal limits. Both lungs are clear. No pleural effusion or  pneumothorax. The visualized skeletal structures are unremarkable. IMPRESSION: No active cardiopulmonary disease. Electronically Signed   By: Lajean Manes M.D.   On: 04/04/2018 21:48    Micro Results   No results found for this or any previous visit (from the past 240 hour(s)).     Today   Subjective    Dominique [name] today has no NEW complaints,           Patient has been seen and examined prior to discharge   Objective   Blood pressure 123/65, pulse 80, temperature 98.5 F (36.9 C), temperature source Oral, resp. rate 18, height 5' 2"  (1.575 m), weight (!) 163.3 kg (360 lb), last menstrual period 03/28/2018, SpO2 100 %.   Intake/Output Summary (Last 24 hours) at 04/23/2018 1446 Last data filed at 04/23/2018 1400 Gross per 24 hour  Intake 2568.29 ml  Output 2050 ml  Net 518.29 ml    Exam Physical Exam General exam:Appears calm and comfortable ,morbidly obese Skin:bilateral inguinal breaks in the skin without drainage or pus, no signs of infection, and TTP. Multiple small 1 cm lesions on pubis region. 2 anal fissures along cleft - no drainage or pus, no signs of infection, and TTP. Orbitals: Normal EOMs. Bilateral corneal conjunctivitis with purulent discharge.  Respiratory system: Clear to auscultation. Respiratory effort normal. Cardiovascular system:S1 &S2 heard, RRR. No JVD, murmurs, rubs, gallops or clicks. No  pedal edema. Gastrointestinal system:Abdomen is nondistended, soft and nontender. No organomegaly or masses felt. Normal bowel sounds heard.Colostomy area is benign in appearance. No signs of infection, nontender.  Central nervous system:Alert and oriented. No focal neurological deficits. Extremities: Symmetric 5 x 5 power. Psychiatry:Judgement and insight appear normal. Mood &affect appropriate.      Data Review   CBC w Diff:  Lab Results  Component Value Date   WBC 6.4 04/23/2018   HGB 7.6 (L) 04/23/2018   HCT 25.2 (L) 04/23/2018   PLT 373  04/23/2018   LYMPHOPCT 18 04/04/2018   MONOPCT 8 04/04/2018   EOSPCT 3 04/04/2018   BASOPCT 1 04/04/2018    CMP:  Lab Results  Component Value Date   NA 140 04/23/2018   K 3.8 04/23/2018   CL 115 (H) 04/23/2018   CO2 20 (L) 04/23/2018   BUN 6 04/23/2018   CREATININE 1.05 (H) 04/23/2018   CREATININE 0.52 12/29/2014   PROT 7.1 04/04/2018   ALBUMIN 3.2 (L) 04/04/2018   BILITOT 0.5 04/04/2018   ALKPHOS 67 04/04/2018   AST 15 04/04/2018   ALT 10 04/04/2018  .   Total Discharge time is about 33 minutes  Roxan Hockey M.D on 04/23/2018 at 2:46 PM   Go to [email or web address] - password [email or web address] for contact info  [facility] - Office  [phone]

## 2018-04-28 ENCOUNTER — Encounter (HOSPITAL_BASED_OUTPATIENT_CLINIC_OR_DEPARTMENT_OTHER): Payer: Medicaid Other

## 2018-04-29 ENCOUNTER — Other Ambulatory Visit: Payer: Self-pay | Admitting: Family

## 2018-04-29 DIAGNOSIS — D560 Alpha thalassemia: Secondary | ICD-10-CM

## 2018-04-29 DIAGNOSIS — D649 Anemia, unspecified: Secondary | ICD-10-CM

## 2018-04-29 DIAGNOSIS — D509 Iron deficiency anemia, unspecified: Secondary | ICD-10-CM

## 2018-04-30 ENCOUNTER — Inpatient Hospital Stay: Payer: Medicaid Other | Attending: Family | Admitting: Family

## 2018-04-30 ENCOUNTER — Inpatient Hospital Stay: Payer: Medicaid Other

## 2018-05-01 ENCOUNTER — Emergency Department (HOSPITAL_COMMUNITY)
Admission: EM | Admit: 2018-05-01 | Discharge: 2018-05-01 | Disposition: A | Discharge status: Home / Self Care | Payer: Medicaid Other | Attending: Emergency Medicine | Admitting: Emergency Medicine

## 2018-05-01 ENCOUNTER — Encounter (HOSPITAL_COMMUNITY): Payer: Self-pay | Admitting: Emergency Medicine

## 2018-05-01 ENCOUNTER — Other Ambulatory Visit: Payer: Self-pay

## 2018-05-01 DIAGNOSIS — R112 Nausea with vomiting, unspecified: Secondary | ICD-10-CM | POA: Diagnosis present

## 2018-05-01 DIAGNOSIS — I2510 Atherosclerotic heart disease of native coronary artery without angina pectoris: Secondary | ICD-10-CM | POA: Insufficient documentation

## 2018-05-01 DIAGNOSIS — N10 Acute tubulo-interstitial nephritis: Secondary | ICD-10-CM | POA: Insufficient documentation

## 2018-05-01 DIAGNOSIS — J45909 Unspecified asthma, uncomplicated: Secondary | ICD-10-CM | POA: Diagnosis not present

## 2018-05-01 DIAGNOSIS — I509 Heart failure, unspecified: Secondary | ICD-10-CM | POA: Diagnosis not present

## 2018-05-01 DIAGNOSIS — F17210 Nicotine dependence, cigarettes, uncomplicated: Secondary | ICD-10-CM | POA: Insufficient documentation

## 2018-05-01 DIAGNOSIS — I110 Hypertensive heart disease with heart failure: Secondary | ICD-10-CM | POA: Diagnosis not present

## 2018-05-01 LAB — URINALYSIS, ROUTINE W REFLEX MICROSCOPIC
BILIRUBIN URINE: NEGATIVE
GLUCOSE, UA: NEGATIVE mg/dL
KETONES UR: 5 mg/dL — AB
NITRITE: NEGATIVE
PROTEIN: 100 mg/dL — AB
Specific Gravity, Urine: 1.025 (ref 1.005–1.030)
pH: 5 (ref 5.0–8.0)

## 2018-05-01 LAB — COMPREHENSIVE METABOLIC PANEL
ALT: 21 U/L (ref 0–44)
ANION GAP: 11 (ref 5–15)
AST: 19 U/L (ref 15–41)
Albumin: 2.9 g/dL — ABNORMAL LOW (ref 3.5–5.0)
Alkaline Phosphatase: 67 U/L (ref 38–126)
BILIRUBIN TOTAL: 0.7 mg/dL (ref 0.3–1.2)
BUN: 13 mg/dL (ref 6–20)
CHLORIDE: 112 mmol/L — AB (ref 98–111)
CO2: 16 mmol/L — ABNORMAL LOW (ref 22–32)
Calcium: 9 mg/dL (ref 8.9–10.3)
Creatinine, Ser: 1.39 mg/dL — ABNORMAL HIGH (ref 0.44–1.00)
GFR calc Af Amer: 55 mL/min — ABNORMAL LOW (ref >60)
GFR, EST NON AFRICAN AMERICAN: 48 mL/min — AB (ref >60)
Glucose, Bld: 92 mg/dL (ref 70–99)
Potassium: 3.8 mmol/L (ref 3.5–5.1)
Sodium: 139 mmol/L (ref 135–145)
TOTAL PROTEIN: 7.3 g/dL (ref 6.5–8.1)

## 2018-05-01 LAB — CBC
HEMATOCRIT: 32.3 % — AB (ref 36.0–46.0)
HEMOGLOBIN: 9.2 g/dL — AB (ref 12.0–15.0)
MCH: 20.7 pg — ABNORMAL LOW (ref 26.0–34.0)
MCHC: 28.5 g/dL — ABNORMAL LOW (ref 30.0–36.0)
MCV: 72.7 fL — AB (ref 78.0–100.0)
Platelets: 518 10*3/uL — ABNORMAL HIGH (ref 150–400)
RBC: 4.44 MIL/uL (ref 3.87–5.11)
RDW: 21.3 % — ABNORMAL HIGH (ref 11.5–15.5)
WBC: 11.4 10*3/uL — AB (ref 4.0–10.5)

## 2018-05-01 LAB — LIPASE, BLOOD: LIPASE: 22 U/L (ref 11–51)

## 2018-05-01 LAB — I-STAT BETA HCG BLOOD, ED (MC, WL, AP ONLY): I-stat hCG, quantitative: <5 m[IU]/mL (ref <5)

## 2018-05-01 MED ORDER — SODIUM CHLORIDE 0.9 % IV BOLUS: 1000.0000 mL | Freq: Once | INTRAVENOUS | Status: DC

## 2018-05-01 MED ORDER — PHENAZOPYRIDINE HCL 100 MG PO TABS
200.0000 mg | ORAL_TABLET | Freq: Once | ORAL | Status: AC
  Administered 2018-05-01: 200 mg via ORAL
  Filled 2018-05-01: qty 2

## 2018-05-01 MED ORDER — MORPHINE SULFATE (PF) 4 MG/ML IV SOLN
6.0000 mg | Freq: Once | INTRAVENOUS | Status: AC
  Administered 2018-05-01: 6 mg via INTRAVENOUS
  Filled 2018-05-01: qty 2

## 2018-05-01 MED ORDER — CEPHALEXIN 500 MG PO CAPS: 500.0000 mg | ORAL_CAPSULE | Freq: Three times a day (TID) | ORAL | 0 refills | Status: DC

## 2018-05-01 MED ORDER — ONDANSETRON 4 MG PO TBDP: 8.0000 mg | ORAL_TABLET | Freq: Once | ORAL | Status: DC

## 2018-05-01 MED ORDER — ONDANSETRON HCL 4 MG/2ML IJ SOLN
4.0000 mg | Freq: Once | INTRAMUSCULAR | Status: AC
  Administered 2018-05-01: 4 mg via INTRAVENOUS
  Filled 2018-05-01: qty 2

## 2018-05-01 MED ORDER — HYDROCODONE-ACETAMINOPHEN 5-325 MG PO TABS
1.0000 | ORAL_TABLET | Freq: Once | ORAL | Status: AC
  Administered 2018-05-01: 1 via ORAL
  Filled 2018-05-01: qty 1

## 2018-05-01 MED ORDER — HYDROCODONE-ACETAMINOPHEN 5-325 MG PO TABS: 1.0000 | ORAL_TABLET | ORAL | 0 refills | Status: DC | PRN

## 2018-05-01 MED ORDER — PHENAZOPYRIDINE HCL 200 MG PO TABS: 200.0000 mg | ORAL_TABLET | Freq: Three times a day (TID) | ORAL | 0 refills | Status: DC

## 2018-05-01 MED ORDER — SODIUM CHLORIDE 0.9 % IV BOLUS
2000.0000 mL | Freq: Once | INTRAVENOUS | Status: AC
  Administered 2018-05-01: 2000 mL via INTRAVENOUS

## 2018-05-01 MED ORDER — SODIUM CHLORIDE 0.9 % IV SOLN
1.0000 g | Freq: Once | INTRAVENOUS | Status: AC
  Administered 2018-05-01: 1 g via INTRAVENOUS
  Filled 2018-05-01: qty 10

## 2018-05-01 MED ORDER — ONDANSETRON 8 MG PO TBDP: 8.0000 mg | ORAL_TABLET | Freq: Three times a day (TID) | ORAL | 0 refills | Status: DC | PRN

## 2018-05-01 NOTE — ED Triage Notes (Signed)
Per [facility], Pt complaining of lower back pain x 1 day. Pt also endorses N/V x 2 days, abdominal pain x 1 day.

## 2018-05-01 NOTE — ED Notes (Signed)
IV team at bedside 

## 2018-05-01 NOTE — ED Provider Notes (Signed)
[facility] EMERGENCY DEPARTMENT Provider Note   CSN: [id number] Arrival date & time: 05/01/18  0235     History   Chief Complaint Chief Complaint  Patient presents with  . Back Pain  . Emesis    HPI Dominique [name] is a 37 y.o. female.  HPI Patient reports nausea vomiting flank pain over the past 24 to 48 hours.  She has had dysuria and urinary hesitancy.  Bilateral flank pain.  No fevers or chills.  No blood in her vomit.  No diarrhea.  Reports some mild suprapubic pain but no symptoms of upper abdominal pain.  Symptoms are moderate to severe in severity.   Past Medical History:  Diagnosis Date  . Acute acalculous cholecystitis 11/16/2014  . Anal fistula   . Anemia   . Anxiety   . Asthma   . CHF (congestive heart failure) ([place])   . Complication of anesthesia    states she had a problem staying awake after her c-section, was transferred [facility] to [facility]  . Coronary artery disease   . Diverticulitis of colon 11/17/2014  . Headache    used to have migraines  . Hepatic steatosis 11/17/2014  . Herpes   . Hypertension   . Morbid obesity ([place]) 03/22/2009   Qualifier: Diagnosis of  By: Ronnald Ramp CMA, [place]    . Neuropathy   . OSA (obstructive sleep apnea) 05/02/2014  . Seasonal allergies 06/13/2014  . Sickle cell anemia ([place])    She states she " has the trait" (07/20/15)  . Sleep apnea    uses Bipap  . Thyromegaly 05/02/2014    Patient Active Problem List   Diagnosis Date Noted  . AKI (acute kidney injury) ([place]) 04/20/2018  . Abnormal TSH 04/20/2018  . Redness of both eyes 04/20/2018  . Eye pain, right 04/20/2018  . Multiple wounds of skin 04/19/2018  . 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 Colostomy in place [facility]) 01/06/2017  . 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 Infliximab (Remicade) long-term use 01/06/2017  . Crohn's colitis, with abscess ([place]) 01/06/2017  . Abdominal abscess 12/25/2016  . Chronic  anemia 12/25/2016  . Hypokalemia 07/21/2015  . Adjustment disorder with mixed anxiety and depressed mood   . Physical deconditioning 06/26/2015  . Severe left ventricular systolic dysfunction 78/29/5621  . Pulmonary hypertension ([place])   . Crohn's colitis with perforation s/p left colectomy/colostomy 2016   . OSA on CPAP   . Obesity hypoventilation syndrome ([place])   . Pressure ulcer 06/16/2015  . Protein-calorie malnutrition, severe ([place]) 06/10/2015  . Gastritis 04/07/2015  . Iron deficiency anemia 04/07/2015  . Sinus tachycardia ([place]) 12/29/2014  . Lumbar back pain 12/06/2014  . Rectal bleeding 11/19/2014  . Diverticulitis of colon 11/17/2014  . Hepatic steatosis 11/17/2014  . Seasonal allergies 06/13/2014  . Thyromegaly 05/02/2014  . AMENORRHEA 02/07/2010  . Morbid obesity- BMI 67.5 03/22/2009  . Sickle cell trait ([place]) 10/20/2007    Past Surgical History:  Procedure Laterality Date  . c section 2011  2011  . COLONOSCOPY WITH PROPOFOL N/A 12/27/2015   Procedure: COLONOSCOPY WITH PROPOFOL;  Surgeon: Wonda Horner, MD;  Location: [facility] ENDOSCOPY;  Service: Endoscopy;  Laterality: N/A;  . COLOSTOMY N/A 06/11/2015   Procedure:  CREATION OF COLOSTOMY;  Surgeon: Georganna Skeans, MD;  Location: [facility];  Service: General;  Laterality: N/A;  . ESOPHAGOGASTRODUODENOSCOPY N/A 07/19/2015   Procedure: ESOPHAGOGASTRODUODENOSCOPY (EGD);  Surgeon: Clarene Essex, MD;  Location: [facility];  Service:  Endoscopy;  Laterality: N/A;  . FLEXIBLE SIGMOIDOSCOPY N/A 06/05/2015   Procedure: FLEXIBLE SIGMOIDOSCOPY;  Surgeon: Ronald Lobo, MD;  Location: [facility] ENDOSCOPY;  Service: Endoscopy;  Laterality: N/A;  . LAPAROTOMY N/A 06/09/2015   Procedure: EXPLORATORY LAPAROTOMY, DRAINAGE OF INTRAABDOMINAL ABSCESS, PARTIAL COLON RESECTION,  APPLICATION OF WOUND VAC;  Surgeon: Georganna Skeans, MD;  Location: [facility];  Service: General;  Laterality: N/A;  . LAPAROTOMY N/A 06/11/2015   Procedure: RE-EXPLORATION OF ABDOMEN;   Surgeon: Georganna Skeans, MD;  Location: [facility];  Service: General;  Laterality: N/A;     OB History   None      Home Medications    Prior to Admission medications   Medication Sig Start Date End Date Taking? Authorizing Provider  acetaminophen (TYLENOL) 500 MG tablet Take 1,000 mg by mouth every 6 (six) hours as needed for mild pain.   Yes [provider]  cephALEXin (KEFLEX) 500 MG capsule Take 1 capsule (500 mg total) by mouth 3 (three) times daily. 05/01/18   Jola Schmidt, MD  DULoxetine (CYMBALTA) 20 MG capsule Take 1 capsule (20 mg total) by mouth 2 (two) times daily. 04/23/18 04/23/19  Roxan Hockey, MD  ferrous sulfate 325 (65 FE) MG tablet 1 tablet twice a day with meals 04/23/18   Emokpae, Courage, MD  fluconazole (DIFLUCAN) 200 MG tablet Take 1 tablet (200 mg total) by mouth daily. 04/23/18   Roxan Hockey, MD  Ganciclovir (ZIRGAN) 0.15 % GEL Place 1 drop into both eyes 5 (five) times daily. 04/23/18   Roxan Hockey, MD  HYDROcodone-acetaminophen (NORCO/VICODIN) 5-325 MG tablet Take 1 tablet by mouth every 4 (four) hours as needed for moderate pain. 05/01/18   Jola Schmidt, MD  neomycin-polymyxin b-dexamethasone (MAXITROL) 3.5-10000-0.1 SUSP Place 1 drop into both eyes 5 (five) times daily. 04/23/18   Roxan Hockey, MD  ondansetron (ZOFRAN ODT) 8 MG disintegrating tablet Take 1 tablet (8 mg total) by mouth every 8 (eight) hours as needed for nausea or vomiting. 05/01/18   Jola Schmidt, MD  oxyCODONE-acetaminophen (PERCOCET/ROXICET) 5-325 MG tablet Take 1 tablet by mouth every 6 (six) hours as needed for severe pain. 04/23/18   Roxan Hockey, MD  PROAIR HFA 108 (90 Base) MCG/ACT inhaler Inhale 2 puffs into the lungs 4 (four) times daily as needed for wheezing or shortness of breath. 04/23/18   Roxan Hockey, MD    Family History Family History  Problem Relation Age of Onset  . Hypertension Mother   . Allergies Mother   . Hypertension Father   . Cancer Paternal  Grandmother        lung  . Hypertension Sister   . Asthma Brother   . Diabetes Maternal Grandmother   . Hypertension Maternal Grandmother   . Hypertension Maternal Grandfather   . Emphysema Paternal Grandfather     Social History Social History   Tobacco Use  . Smoking status: Current Some Day Smoker    Packs/day: 0.50    Years: 4.00    Pack years: 2.00    Types: Cigarettes    Start date: 07/24/2014    Last attempt to quit: 05/27/2015    Years since quitting: 2.9  . Smokeless tobacco: Never Used  Substance Use Topics  . Alcohol use: No    Alcohol/week: 0.0 standard drinks  . Drug use: No     Allergies   Other and Penicillins   Review of Systems Review of Systems  All other systems reviewed and are negative.    Physical Exam Updated  Vital Signs BP 131/83 (BP Location: Right Wrist)   Pulse (!) 111   Temp 98.1 F (36.7 C) (Oral)   Resp 17   Ht 5' 2"  (1.575 m)   Wt (!) 165.6 kg   LMP 04/23/2018   SpO2 99%   BMI 66.76 kg/m   Physical Exam  Constitutional: She is oriented to person, place, and time. She appears well-developed and well-nourished. No distress.  Uncomfortable appearing  HENT:  Head: Normocephalic and atraumatic.  Eyes: EOM are normal.  Neck: Normal range of motion.  Cardiovascular: Normal rate, regular rhythm and normal heart sounds.  Pulmonary/Chest: Effort normal and breath sounds normal.  Abdominal: Soft. She exhibits no distension. There is no tenderness.  Genitourinary:  Genitourinary Comments: Bilateral CVA tenderness  Musculoskeletal: Normal range of motion.  Neurological: She is alert and oriented to person, place, and time.  Skin: Skin is warm and dry.  Psychiatric: She has a normal mood and affect. Judgment normal.  Nursing note and vitals reviewed.    ED Treatments / Results  Labs (all labs ordered are listed, but only abnormal results are displayed) Labs Reviewed  COMPREHENSIVE METABOLIC PANEL - Abnormal; Notable for the  following components:      Result Value   Chloride 112 (*)    CO2 16 (*)    Creatinine, Ser 1.39 (*)    Albumin 2.9 (*)    GFR calc non Af Amer 48 (*)    GFR calc Af Amer 55 (*)    All other components within normal limits  CBC - Abnormal; Notable for the following components:   WBC 11.4 (*)    Hemoglobin 9.2 (*)    HCT 32.3 (*)    MCV 72.7 (*)    MCH 20.7 (*)    MCHC 28.5 (*)    RDW 21.3 (*)    Platelets 518 (*)    All other components within normal limits  URINALYSIS, ROUTINE W REFLEX MICROSCOPIC - Abnormal; Notable for the following components:   APPearance TURBID (*)    Hgb urine dipstick LARGE (*)    Ketones, ur 5 (*)    Protein, ur 100 (*)    Leukocytes, UA LARGE (*)    RBC / HPF >50 (*)    WBC, UA >50 (*)    Bacteria, UA MANY (*)    Non Squamous Epithelial 6-10 (*)    All other components within normal limits  URINE CULTURE  LIPASE, BLOOD  I-STAT BETA HCG BLOOD, ED ([facility], [facility], [facility] ONLY)    EKG None  Radiology No results found.  Procedures Procedures (including critical care time)  Medications Ordered in ED Medications  sodium chloride 0.9 % bolus 2,000 mL (2,000 mLs Intravenous New Bag/Given 05/01/18 0630)  cefTRIAXone (ROCEPHIN) 1 g in sodium chloride 0.9 % 100 mL IVPB (1 g Intravenous New Bag/Given 05/01/18 0649)  ondansetron (ZOFRAN) injection 4 mg (4 mg Intravenous Given 05/01/18 0623)  morphine 4 MG/ML injection 6 mg (6 mg Intravenous Given 05/01/18 7858)     Initial Impression / Assessment and Plan / ED Course  I have reviewed the triage vital signs and the nursing notes.  Pertinent labs & imaging results that were available during my care of the patient were reviewed by me and considered in my medical decision making (see chart for details).     Urine culture sent.  Improvement in symptoms here in the emergency department.  Patient be treated for pyelonephritis.  Patient instructed to follow closely with her  primary care physician.  Patient understands  return to the ER for new or worsening symptoms  Final Clinical Impressions(s) / ED Diagnoses   Final diagnoses:  Acute pyelonephritis    ED Discharge Orders         Ordered    cephALEXin (KEFLEX) 500 MG capsule  3 times daily     05/01/18 0715    HYDROcodone-acetaminophen (NORCO/VICODIN) 5-325 MG tablet  Every 4 hours PRN     05/01/18 0715    ondansetron (ZOFRAN ODT) 8 MG disintegrating tablet  Every 8 hours PRN     05/01/18 0715           Jola Schmidt, MD 05/01/18 [id number]

## 2018-05-02 LAB — URINE CULTURE: Culture: >=100000 — AB

## 2018-05-03 ENCOUNTER — Telehealth: Payer: Self-pay

## 2018-05-03 NOTE — Telephone Encounter (Signed)
Post ED Visit - Positive Culture Follow-up  Culture report reviewed by antimicrobial stewardship pharmacist:  []  Elenor Quinones, Pharm.D. []  Heide Guile, Pharm.D., BCPS AQ-ID []  Parks Neptune, Pharm.D., BCPS []  Alycia Rossetti, Pharm.D., BCPS []  Urania, Pharm.D., BCPS, AAHIVP []  Legrand Como, Pharm.D., BCPS, AAHIVP [x]  Salome Arnt, PharmD, BCPS []  Johnnette Gourd, PharmD, BCPS []  Hughes Better, PharmD, BCPS []  Leeroy Cha, PharmD  Positive urine culture  and no further patient follow-up is required at this time.  Genia Del 05/03/2018, 9:40 AM

## 2018-05-30 ENCOUNTER — Encounter (HOSPITAL_COMMUNITY): Payer: Self-pay

## 2018-05-30 ENCOUNTER — Inpatient Hospital Stay (HOSPITAL_COMMUNITY)
Admission: EM | Admit: 2018-05-30 | Discharge: 2018-06-03 | DRG: 602 | Disposition: A | Discharge status: Home Health | Payer: Medicaid Other | Attending: Internal Medicine | Admitting: Internal Medicine

## 2018-05-30 ENCOUNTER — Emergency Department (HOSPITAL_COMMUNITY): Payer: Medicaid Other

## 2018-05-30 ENCOUNTER — Other Ambulatory Visit: Payer: Self-pay

## 2018-05-30 DIAGNOSIS — Z6841 Body Mass Index (BMI) 40.0 and over, adult: Secondary | ICD-10-CM | POA: Diagnosis not present

## 2018-05-30 DIAGNOSIS — K5010 Crohn's disease of large intestine without complications: Secondary | ICD-10-CM | POA: Diagnosis present

## 2018-05-30 DIAGNOSIS — Z880 Allergy status to penicillin: Secondary | ICD-10-CM

## 2018-05-30 DIAGNOSIS — I471 Supraventricular tachycardia: Secondary | ICD-10-CM | POA: Diagnosis present

## 2018-05-30 DIAGNOSIS — Z9989 Dependence on other enabling machines and devices: Secondary | ICD-10-CM | POA: Diagnosis not present

## 2018-05-30 DIAGNOSIS — J45909 Unspecified asthma, uncomplicated: Secondary | ICD-10-CM | POA: Diagnosis present

## 2018-05-30 DIAGNOSIS — L03314 Cellulitis of groin: Secondary | ICD-10-CM | POA: Diagnosis present

## 2018-05-30 DIAGNOSIS — E662 Morbid (severe) obesity with alveolar hypoventilation: Secondary | ICD-10-CM | POA: Diagnosis present

## 2018-05-30 DIAGNOSIS — D62 Acute posthemorrhagic anemia: Secondary | ICD-10-CM | POA: Diagnosis present

## 2018-05-30 DIAGNOSIS — Z91018 Allergy to other foods: Secondary | ICD-10-CM | POA: Diagnosis not present

## 2018-05-30 DIAGNOSIS — J181 Lobar pneumonia, unspecified organism: Secondary | ICD-10-CM | POA: Diagnosis present

## 2018-05-30 DIAGNOSIS — Z433 Encounter for attention to colostomy: Secondary | ICD-10-CM

## 2018-05-30 DIAGNOSIS — I129 Hypertensive chronic kidney disease with stage 1 through stage 4 chronic kidney disease, or unspecified chronic kidney disease: Secondary | ICD-10-CM | POA: Diagnosis present

## 2018-05-30 DIAGNOSIS — I420 Dilated cardiomyopathy: Secondary | ICD-10-CM | POA: Diagnosis not present

## 2018-05-30 DIAGNOSIS — K50118 Crohn's disease of large intestine with other complication: Secondary | ICD-10-CM | POA: Diagnosis not present

## 2018-05-30 DIAGNOSIS — G4733 Obstructive sleep apnea (adult) (pediatric): Secondary | ICD-10-CM | POA: Diagnosis not present

## 2018-05-30 DIAGNOSIS — F17210 Nicotine dependence, cigarettes, uncomplicated: Secondary | ICD-10-CM | POA: Diagnosis present

## 2018-05-30 DIAGNOSIS — L0390 Cellulitis, unspecified: Secondary | ICD-10-CM | POA: Diagnosis not present

## 2018-05-30 DIAGNOSIS — I2510 Atherosclerotic heart disease of native coronary artery without angina pectoris: Secondary | ICD-10-CM | POA: Diagnosis present

## 2018-05-30 DIAGNOSIS — R1084 Generalized abdominal pain: Secondary | ICD-10-CM | POA: Diagnosis present

## 2018-05-30 DIAGNOSIS — R509 Fever, unspecified: Secondary | ICD-10-CM

## 2018-05-30 DIAGNOSIS — I951 Orthostatic hypotension: Secondary | ICD-10-CM | POA: Diagnosis not present

## 2018-05-30 DIAGNOSIS — Z9049 Acquired absence of other specified parts of digestive tract: Secondary | ICD-10-CM

## 2018-05-30 DIAGNOSIS — Z933 Colostomy status: Secondary | ICD-10-CM

## 2018-05-30 DIAGNOSIS — L03818 Cellulitis of other sites: Secondary | ICD-10-CM | POA: Diagnosis not present

## 2018-05-30 DIAGNOSIS — R238 Other skin changes: Secondary | ICD-10-CM

## 2018-05-30 DIAGNOSIS — J189 Pneumonia, unspecified organism: Secondary | ICD-10-CM

## 2018-05-30 DIAGNOSIS — I959 Hypotension, unspecified: Secondary | ICD-10-CM | POA: Diagnosis present

## 2018-05-30 DIAGNOSIS — H44003 Unspecified purulent endophthalmitis, bilateral: Secondary | ICD-10-CM | POA: Diagnosis present

## 2018-05-30 DIAGNOSIS — N183 Chronic kidney disease, stage 3 (moderate): Secondary | ICD-10-CM | POA: Diagnosis present

## 2018-05-30 DIAGNOSIS — I10 Essential (primary) hypertension: Secondary | ICD-10-CM | POA: Diagnosis not present

## 2018-05-30 DIAGNOSIS — D509 Iron deficiency anemia, unspecified: Secondary | ICD-10-CM | POA: Diagnosis present

## 2018-05-30 DIAGNOSIS — L03311 Cellulitis of abdominal wall: Principal | ICD-10-CM | POA: Diagnosis present

## 2018-05-30 DIAGNOSIS — R000 Tachycardia, unspecified: Secondary | ICD-10-CM | POA: Diagnosis not present

## 2018-05-30 DIAGNOSIS — T148XXA Other injury of unspecified body region, initial encounter: Secondary | ICD-10-CM | POA: Diagnosis present

## 2018-05-30 LAB — CBC WITH DIFFERENTIAL/PLATELET
BASOS PCT: 0 %
Basophils Absolute: 0 10*3/uL (ref 0.0–0.1)
Eosinophils Absolute: 0.2 10*3/uL (ref 0.0–0.7)
Eosinophils Relative: 2 %
HCT: 28.9 % — ABNORMAL LOW (ref 36.0–46.0)
Hemoglobin: 8.6 g/dL — ABNORMAL LOW (ref 12.0–15.0)
LYMPHS ABS: 1.1 10*3/uL (ref 0.7–4.0)
LYMPHS PCT: 9 %
MCH: 21.1 pg — ABNORMAL LOW (ref 26.0–34.0)
MCHC: 29.8 g/dL — ABNORMAL LOW (ref 30.0–36.0)
MCV: 71 fL — AB (ref 78.0–100.0)
Monocytes Absolute: 1.3 10*3/uL — ABNORMAL HIGH (ref 0.1–1.0)
Monocytes Relative: 11 %
Neutro Abs: 9.1 10*3/uL — ABNORMAL HIGH (ref 1.7–7.7)
Neutrophils Relative %: 78 %
Platelets: 517 10*3/uL — ABNORMAL HIGH (ref 150–400)
RBC: 4.07 MIL/uL (ref 3.87–5.11)
RDW: 21.1 % — ABNORMAL HIGH (ref 11.5–15.5)
WBC: 11.7 10*3/uL — ABNORMAL HIGH (ref 4.0–10.5)

## 2018-05-30 LAB — COMPREHENSIVE METABOLIC PANEL
ALBUMIN: 2.3 g/dL — AB (ref 3.5–5.0)
ALK PHOS: 82 U/L (ref 38–126)
ALT: 8 U/L (ref 0–44)
ANION GAP: 9 (ref 5–15)
AST: 14 U/L — ABNORMAL LOW (ref 15–41)
BUN: 14 mg/dL (ref 6–20)
CALCIUM: 8.5 mg/dL — AB (ref 8.9–10.3)
CO2: 18 mmol/L — AB (ref 22–32)
CREATININE: 1.09 mg/dL — AB (ref 0.44–1.00)
Chloride: 113 mmol/L — ABNORMAL HIGH (ref 98–111)
GFR calc Af Amer: >60 mL/min (ref >60)
GFR calc non Af Amer: >60 mL/min (ref >60)
GLUCOSE: 92 mg/dL (ref 70–99)
Potassium: 3.7 mmol/L (ref 3.5–5.1)
SODIUM: 140 mmol/L (ref 135–145)
Total Bilirubin: 0.5 mg/dL (ref 0.3–1.2)
Total Protein: 6.7 g/dL (ref 6.5–8.1)

## 2018-05-30 LAB — FERRITIN: FERRITIN: 15 ng/mL (ref 11–307)

## 2018-05-30 LAB — IRON AND TIBC
Iron: 14 ug/dL — ABNORMAL LOW (ref 28–170)
Saturation Ratios: 5 % — ABNORMAL LOW (ref 10.4–31.8)
TIBC: 258 ug/dL (ref 250–450)
UIBC: 244 ug/dL

## 2018-05-30 LAB — LIPASE, BLOOD: Lipase: 24 U/L (ref 11–51)

## 2018-05-30 LAB — I-STAT CG4 LACTIC ACID, ED: Lactic Acid, Venous: 1.03 mmol/L (ref 0.5–1.9)

## 2018-05-30 MED ORDER — POLYETHYLENE GLYCOL 3350 17 G PO PACK: 17.0000 g | PACK | Freq: Every day | ORAL | Status: DC | PRN

## 2018-05-30 MED ORDER — VANCOMYCIN HCL 10 G IV SOLR
1500.0000 mg | Freq: Two times a day (BID) | INTRAVENOUS | Status: DC
  Administered 2018-05-31 – 2018-06-01 (×4): 1500 mg via INTRAVENOUS
  Filled 2018-05-30 (×4): qty 1500

## 2018-05-30 MED ORDER — TIZANIDINE HCL 4 MG PO TABS: 4.0000 mg | ORAL_TABLET | Freq: Three times a day (TID) | ORAL | Status: DC | PRN

## 2018-05-30 MED ORDER — IOPAMIDOL (ISOVUE-300) INJECTION 61%: 30.0000 mL | Freq: Once | INTRAVENOUS | Status: DC | PRN

## 2018-05-30 MED ORDER — IOPAMIDOL (ISOVUE-300) INJECTION 61%
100.0000 mL | Freq: Once | INTRAVENOUS | Status: AC | PRN
  Administered 2018-05-30: 100 mL via INTRAVENOUS

## 2018-05-30 MED ORDER — METRONIDAZOLE IN NACL 5-0.79 MG/ML-% IV SOLN
500.0000 mg | Freq: Three times a day (TID) | INTRAVENOUS | Status: DC
  Administered 2018-05-30 – 2018-06-03 (×11): 500 mg via INTRAVENOUS
  Filled 2018-05-30 (×11): qty 100

## 2018-05-30 MED ORDER — FENTANYL CITRATE (PF) 100 MCG/2ML IJ SOLN
50.0000 ug | Freq: Once | INTRAMUSCULAR | Status: AC
  Administered 2018-05-30: 50 ug via INTRAVENOUS
  Filled 2018-05-30: qty 2

## 2018-05-30 MED ORDER — SODIUM CHLORIDE 0.9 % IV BOLUS
1000.0000 mL | Freq: Once | INTRAVENOUS | Status: AC
  Administered 2018-05-30: 1000 mL via INTRAVENOUS

## 2018-05-30 MED ORDER — HYDROCODONE-ACETAMINOPHEN 5-325 MG PO TABS
1.0000 | ORAL_TABLET | ORAL | Status: DC | PRN
  Administered 2018-05-30 – 2018-05-31 (×6): 2 via ORAL
  Administered 2018-06-01: 1 via ORAL
  Administered 2018-06-01 – 2018-06-03 (×3): 2 via ORAL
  Filled 2018-05-30: qty 2
  Filled 2018-05-30: qty 1
  Filled 2018-05-30 (×9): qty 2

## 2018-05-30 MED ORDER — ACETAMINOPHEN 325 MG PO TABS: 650.0000 mg | ORAL_TABLET | Freq: Four times a day (QID) | ORAL | Status: DC | PRN

## 2018-05-30 MED ORDER — METOPROLOL TARTRATE 5 MG/5ML IV SOLN
5.0000 mg | Freq: Once | INTRAVENOUS | Status: AC
  Administered 2018-05-30: 5 mg via INTRAVENOUS
  Filled 2018-05-30: qty 5

## 2018-05-30 MED ORDER — SODIUM CHLORIDE 0.9% FLUSH
3.0000 mL | Freq: Two times a day (BID) | INTRAVENOUS | Status: DC
  Administered 2018-06-02: 3 mL via INTRAVENOUS

## 2018-05-30 MED ORDER — ENOXAPARIN SODIUM 80 MG/0.8ML ~~LOC~~ SOLN
80.0000 mg | SUBCUTANEOUS | Status: DC
  Administered 2018-05-30 – 2018-06-02 (×4): 80 mg via SUBCUTANEOUS
  Filled 2018-05-30 (×4): qty 0.8

## 2018-05-30 MED ORDER — MORPHINE SULFATE (PF) 4 MG/ML IV SOLN
4.0000 mg | Freq: Once | INTRAVENOUS | Status: AC
  Administered 2018-05-30: 4 mg via INTRAVENOUS
  Filled 2018-05-30: qty 1

## 2018-05-30 MED ORDER — MORPHINE SULFATE (PF) 2 MG/ML IV SOLN
2.0000 mg | INTRAVENOUS | Status: DC | PRN
  Administered 2018-05-30 – 2018-05-31 (×5): 2 mg via INTRAVENOUS
  Filled 2018-05-30 (×5): qty 1

## 2018-05-30 MED ORDER — ALBUTEROL SULFATE (2.5 MG/3ML) 0.083% IN NEBU
2.5000 mg | INHALATION_SOLUTION | RESPIRATORY_TRACT | Status: DC | PRN
  Filled 2018-05-30: qty 3

## 2018-05-30 MED ORDER — LORAZEPAM 2 MG/ML IJ SOLN
0.5000 mg | Freq: Once | INTRAMUSCULAR | Status: AC
  Administered 2018-05-30: 0.5 mg via INTRAVENOUS
  Filled 2018-05-30: qty 1

## 2018-05-30 MED ORDER — SODIUM CHLORIDE 0.9 % IV SOLN
2.0000 g | Freq: Three times a day (TID) | INTRAVENOUS | Status: DC
  Administered 2018-05-30 – 2018-06-03 (×11): 2 g via INTRAVENOUS
  Filled 2018-05-30 (×13): qty 2

## 2018-05-30 MED ORDER — SODIUM CHLORIDE 0.9 % IV SOLN
INTRAVENOUS | Status: DC
  Administered 2018-05-30 – 2018-06-02 (×5): via INTRAVENOUS

## 2018-05-30 MED ORDER — SODIUM CHLORIDE 0.9 % IV SOLN
250.0000 mL | INTRAVENOUS | Status: DC | PRN
  Administered 2018-06-01 – 2018-06-03 (×2): 250 mL via INTRAVENOUS

## 2018-05-30 MED ORDER — IPRATROPIUM-ALBUTEROL 0.5-2.5 (3) MG/3ML IN SOLN: 3.0000 mL | Freq: Three times a day (TID) | RESPIRATORY_TRACT | Status: DC

## 2018-05-30 MED ORDER — SODIUM CHLORIDE 0.9 % IV SOLN
1.0000 g | Freq: Once | INTRAVENOUS | Status: AC
  Administered 2018-05-30: 1 g via INTRAVENOUS
  Filled 2018-05-30: qty 10

## 2018-05-30 MED ORDER — ONDANSETRON HCL 4 MG PO TABS: 4.0000 mg | ORAL_TABLET | Freq: Four times a day (QID) | ORAL | Status: DC | PRN

## 2018-05-30 MED ORDER — ACETAMINOPHEN 650 MG RE SUPP: 650.0000 mg | Freq: Four times a day (QID) | RECTAL | Status: DC | PRN

## 2018-05-30 MED ORDER — ONDANSETRON HCL 4 MG/2ML IJ SOLN
4.0000 mg | Freq: Four times a day (QID) | INTRAMUSCULAR | Status: DC | PRN
  Administered 2018-05-31 – 2018-06-01 (×2): 4 mg via INTRAVENOUS
  Filled 2018-05-30 (×2): qty 2

## 2018-05-30 MED ORDER — VANCOMYCIN HCL 10 G IV SOLR
2000.0000 mg | Freq: Once | INTRAVENOUS | Status: AC
  Administered 2018-05-30: 2000 mg via INTRAVENOUS
  Filled 2018-05-30: qty 2000

## 2018-05-30 MED ORDER — IPRATROPIUM-ALBUTEROL 0.5-2.5 (3) MG/3ML IN SOLN
3.0000 mL | Freq: Three times a day (TID) | RESPIRATORY_TRACT | Status: DC
  Administered 2018-05-31: 3 mL via RESPIRATORY_TRACT
  Filled 2018-05-30 (×2): qty 3

## 2018-05-30 MED ORDER — IOPAMIDOL (ISOVUE-300) INJECTION 61%
INTRAVENOUS | Status: AC
  Filled 2018-05-30: qty 100

## 2018-05-30 MED ORDER — SODIUM CHLORIDE 0.9% FLUSH: 3.0000 mL | INTRAVENOUS | Status: DC | PRN

## 2018-05-30 NOTE — H&P (Signed)
History and Physical  Dominique [name] IWP:[id number] DOB: [birth year] DOA: 05/30/2018   PCP: Benito Mccreedy, MD  Patient coming from: Home & is able to ambulate independently  Chief Complaint: Dizziness, multiple foul-smelling wounds  HPI: Dominique [name] is a 37 y.o. female with medical history significant for Crohn's s/p ostomy, asthma, HTN, OSA, morbid obesity, anal fistula presents to the ED complaining of several days of feeling dizzy, shortness of breath, subjective fever/chills. Patient denies any chest pain, cough.  In the ED, patient was noted to have foul smelly multiple wounds located on her abdomen, groin area, with some drainage noted.  Patient reports a nurse comes in to help her take care of the wound.  Patient reported multiple wounds has been ongoing for a couple of months, but has not seen any physician.  Patient has a history of Crohn's disease status post ostomy that was noted to have some blood mixed with stool in the bag.  Patient denies any abdominal pain, nausea/vomiting.  ED Course: Patient was noted to be initially hypotensive, responded to IV fluids.  WBC 11.7, hemoglobin 8.6, lactic acid 1.0, chest x-ray done showed a questionable left lower lobe pneumonia, CT abdomen showed no bowel inflammatory changes or obstruction.  Review of Systems: Review of systems are otherwise negative   Past Medical History:  Diagnosis Date  . Acute acalculous cholecystitis 11/16/2014  . Anal fistula   . Anemia   . Anxiety   . Asthma   . CHF (congestive heart failure) ([place])   . Complication of anesthesia    states she had a problem staying awake after her c-section, was [facility]  . Coronary artery disease   . Diverticulitis of colon 11/17/2014  . Headache    used to have migraines  . Hepatic steatosis 11/17/2014  . Herpes   . Hypertension   . Morbid obesity ([place]) 03/22/2009   Qualifier: Diagnosis of  By: Ronnald Ramp CMA, [place]    . Neuropathy   . OSA (obstructive  sleep apnea) 05/02/2014  . Seasonal allergies 06/13/2014  . Sickle cell anemia ([place])    She states she " has the trait" (07/20/15)  . Sleep apnea    uses Bipap  . Thyromegaly 05/02/2014   Past Surgical History:  Procedure Laterality Date  . c section 2011  2011  . COLONOSCOPY WITH PROPOFOL N/A 12/27/2015   Procedure: COLONOSCOPY WITH PROPOFOL;  Surgeon: Wonda Horner, MD;  Location: [facility] ENDOSCOPY;  Service: Endoscopy;  Laterality: N/A;  . COLOSTOMY N/A 06/11/2015   Procedure:  CREATION OF COLOSTOMY;  Surgeon: Georganna Skeans, MD;  Location: [place];  Service: General;  Laterality: N/A;  . ESOPHAGOGASTRODUODENOSCOPY N/A 07/19/2015   Procedure: ESOPHAGOGASTRODUODENOSCOPY (EGD);  Surgeon: Clarene Essex, MD;  Location: [facility] ENDOSCOPY;  Service: Endoscopy;  Laterality: N/A;  . FLEXIBLE SIGMOIDOSCOPY N/A 06/05/2015   Procedure: FLEXIBLE SIGMOIDOSCOPY;  Surgeon: Ronald Lobo, MD;  Location: [facility] ENDOSCOPY;  Service: Endoscopy;  Laterality: N/A;  . LAPAROTOMY N/A 06/09/2015   Procedure: EXPLORATORY LAPAROTOMY, DRAINAGE OF INTRAABDOMINAL ABSCESS, PARTIAL COLON RESECTION,  APPLICATION OF WOUND VAC;  Surgeon: Georganna Skeans, MD;  Location: [place];  Service: General;  Laterality: N/A;  . LAPAROTOMY N/A 06/11/2015   Procedure: RE-EXPLORATION OF ABDOMEN;  Surgeon: Georganna Skeans, MD;  Location: [place];  Service: General;  Laterality: N/A;    Social History:  reports that she has been smoking cigarettes. She started smoking about 3 years ago. She has a 2.00 pack-year smoking history. She has never used  smokeless tobacco. She reports that she does not drink alcohol or use drugs.   Allergies  Allergen Reactions  . Other Shortness Of Breath and Swelling    Tree nuts  . Penicillins Other (See Comments)    Unknown childhood allergy Has patient had a PCN reaction causing immediate rash, facial/tongue/throat swelling, SOB or lightheadedness with hypotension: Unknown Has patient had a PCN reaction causing severe rash  involving mucus membranes or skin necrosis: Unknown Has patient had a PCN reaction that required hospitalization: Unknown Has patient had a PCN reaction occurring within the last 10 years: Unknown If all of the above answers are "NO", then may proceed with Cephalosporin use.     Family History  Problem Relation Age of Onset  . Hypertension Mother   . Allergies Mother   . Hypertension Father   . Cancer Paternal Grandmother        lung  . Hypertension Sister   . Asthma Brother   . Diabetes Maternal Grandmother   . Hypertension Maternal Grandmother   . Hypertension Maternal Grandfather   . Emphysema Paternal Grandfather       Prior to Admission medications   Medication Sig Start Date End Date Taking? Authorizing Provider  acetaminophen (TYLENOL) 500 MG tablet Take 1,000-2,000 mg by mouth every 6 (six) hours as needed for mild pain.    Yes [provider]  ibuprofen (ADVIL,MOTRIN) 200 MG tablet Take 800 mg by mouth every 6 (six) hours as needed for moderate pain.   Yes [provider]  tiZANidine (ZANAFLEX) 4 MG tablet Take 4 mg by mouth every 8 (eight) hours as needed for muscle spasms.  05/14/18  Yes [provider]  cephALEXin (KEFLEX) 500 MG capsule Take 1 capsule (500 mg total) by mouth 3 (three) times daily. Patient not taking: Reported on 05/30/2018 05/01/18   Jola Schmidt, MD  DULoxetine (CYMBALTA) 20 MG capsule Take 1 capsule (20 mg total) by mouth 2 (two) times daily. Patient not taking: Reported on 05/30/2018 04/23/18 04/23/19  Roxan Hockey, MD  ferrous sulfate 325 (65 FE) MG tablet 1 tablet twice a day with meals Patient not taking: Reported on 05/30/2018 04/23/18   Roxan Hockey, MD  fluconazole (DIFLUCAN) 200 MG tablet Take 1 tablet (200 mg total) by mouth daily. Patient not taking: Reported on 05/30/2018 04/23/18   Roxan Hockey, MD  Ganciclovir (ZIRGAN) 0.15 % GEL Place 1 drop into both eyes 5 (five) times daily. Patient not taking: Reported on  05/30/2018 04/23/18   Roxan Hockey, MD  HYDROcodone-acetaminophen (NORCO/VICODIN) 5-325 MG tablet Take 1 tablet by mouth every 4 (four) hours as needed for moderate pain. Patient not taking: Reported on 05/30/2018 05/01/18   Jola Schmidt, MD  neomycin-polymyxin b-dexamethasone (MAXITROL) 3.5-10000-0.1 SUSP Place 1 drop into both eyes 5 (five) times daily. Patient not taking: Reported on 05/30/2018 04/23/18   Roxan Hockey, MD  ondansetron (ZOFRAN ODT) 8 MG disintegrating tablet Take 1 tablet (8 mg total) by mouth every 8 (eight) hours as needed for nausea or vomiting. Patient not taking: Reported on 05/30/2018 05/01/18   Jola Schmidt, MD  oxyCODONE-acetaminophen (PERCOCET/ROXICET) 5-325 MG tablet Take 1 tablet by mouth every 6 (six) hours as needed for severe pain. Patient not taking: Reported on 05/30/2018 04/23/18   Roxan Hockey, MD  phenazopyridine (PYRIDIUM) 200 MG tablet Take 1 tablet (200 mg total) by mouth 3 (three) times daily. Patient not taking: Reported on 05/30/2018 05/01/18   Jola Schmidt, MD  [facility] HFA 108 [phone] Base) MCG/ACT inhaler  Inhale 2 puffs into the lungs 4 (four) times daily as needed for wheezing or shortness of breath. Patient not taking: Reported on 05/30/2018 04/23/18   Roxan Hockey, MD    Physical Exam: BP (!) 114/50   Pulse 98   Temp 98 F (36.7 C) (Oral)   Resp 14   Ht 5' 2"  (1.575 m)   Wt (!) 163 kg   LMP 05/29/2018 Comment: pregnancy waiver form signed 05-30-2018  SpO2 100%   BMI 65.73 kg/m   General: Mild distress due to multiple painful wounds Eyes: Normal ENT: Normal Neck: Obese Cardiovascular: S1, S2 present Respiratory: CTA B Abdomen: Soft, obese, nontender, bowel sounds present, multiple foul smelling wounds noted with drainage, ostomy bag noted with brown liquid stool with questionable blood Skin: Multiple foul-smelling wounds noted with drainage on the abdomen, groin area Musculoskeletal: No pedal edema bilaterally Psychiatric: Normal mood Neurologic:  No focal neurologic deficit noted          Labs on Admission:  Basic Metabolic Panel: Recent Labs  Lab 05/30/18 0245  NA 140  K 3.7  CL 113*  CO2 18*  GLUCOSE 92  BUN 14  CREATININE 1.09*  CALCIUM 8.5*   Liver Function Tests: Recent Labs  Lab 05/30/18 0245  AST 14*  ALT 8  ALKPHOS 82  BILITOT 0.5  PROT 6.7  ALBUMIN 2.3*   Recent Labs  Lab 05/30/18 0245  LIPASE 24   No results for input(s): AMMONIA in the last 168 hours. CBC: Recent Labs  Lab 05/30/18 0245  WBC 11.7*  NEUTROABS 9.1*  HGB 8.6*  HCT 28.9*  MCV 71.0*  PLT 517*   Cardiac Enzymes: No results for input(s): CKTOTAL, CKMB, CKMBINDEX, TROPONINI in the last 168 hours.  BNP (last 3 results) No results for input(s): BNP in the last 8760 hours.  ProBNP (last 3 results) No results for input(s): PROBNP in the last 8760 hours.  CBG: No results for input(s): GLUCAP in the last 168 hours.  Radiological Exams on Admission: Dg Chest 2 View  Result Date: 05/30/2018 CLINICAL DATA:  Fever EXAM: CHEST - 2 VIEW COMPARISON:  None. FINDINGS: Retrocardiac opacity suspicious for left lower lobe pneumonia. Heart size is top normal and stable. Nonaneurysmal thoracic aorta. No acute osseous abnormality. IMPRESSION: Retrocardiac opacity suspicious for left lower lobe pneumonia. Electronically Signed   By: Ashley Royalty M.D.   On: 05/30/2018 03:27   Ct Abdomen Pelvis W Contrast  Result Date: 05/30/2018 CLINICAL DATA:  Abdominal discomfort with fever and chills. Nausea. Poor oral intake over the past few days. History of Crohn's disease. EXAM: CT ABDOMEN AND PELVIS WITH CONTRAST TECHNIQUE: Multidetector CT imaging of the abdomen and pelvis was performed using the standard protocol following bolus administration of intravenous contrast. CONTRAST:  139m ISOVUE-300 IOPAMIDOL (ISOVUE-300) INJECTION 61% COMPARISON:  CT abdomen pelvis dated January 06, 2017. FINDINGS: Lower chest: No acute abnormality. Hepatobiliary: No focal liver  abnormality is seen. No gallstones, gallbladder wall thickening, or biliary dilatation. Pancreas: Unremarkable. No pancreatic ductal dilatation or surrounding inflammatory changes. Spleen: Normal in size without focal abnormality. Adrenals/Urinary Tract: Adrenal glands are unremarkable. Kidneys are normal, without renal calculi, focal lesion, or hydronephrosis. Bladder is unremarkable. Stomach/Bowel: The stomach is within normal limits. Postsurgical changes related to interval right hemicolectomy. Prior partial left-sided colectomy. The residual sigmoid colon and rectum are unchanged. New right upper quadrant enterostomy. Several small bowel loops are adhered to the anterior abdominal wall, consistent with adhesions. No bowel wall thickening, distention, or surrounding  inflammatory changes. Vascular/Lymphatic: No significant vascular findings are present. No enlarged abdominal or pelvic lymph nodes. Reproductive: Uterus and bilateral adnexa are unremarkable. Other: No free fluid or pneumoperitoneum. Small fat containing parastomal hernia. Musculoskeletal: No acute or significant osseous findings. IMPRESSION: 1. Postsurgical changes related to subtotal colectomy with new right upper quadrant enterostomy. No bowel inflammatory changes or obstruction. 2. Small fat-containing parastomal hernia. Electronically Signed   By: Titus Dubin M.D.   On: 05/30/2018 10:11    EKG: Independently reviewed, no acute ST changes  Assessment/Plan Present on Admission: . Cellulitis . Morbid obesity- BMI 67.5 . Iron deficiency anemia . Crohn's colitis with perforation s/p left colectomy/colostomy 2016 . Obesity hypoventilation syndrome ([place]) . Multiple wounds of skin  Principal Problem:   Cellulitis Active Problems:   Morbid obesity- BMI 67.5   Iron deficiency anemia   Crohn's colitis with perforation s/p left colectomy/colostomy 2016   OSA on CPAP   Obesity hypoventilation syndrome ([facility])   Colostomy in place  [facility])   Multiple wounds of skin  Cellulitis of the abdomen, groin Patients with chronic multiple foul-smelling wounds on abdomen, groin area for the past couple of months Currently afebrile with mild leukocytosis of 11.7 LA within normal limits BC x2 pending Superficial wound culture pending Continue IV vancomycin, cefepime, metronidazole Continue IV fluids Consult to wound care, may consider general surgery for further management  CAP Noted fever/chills at home, SOB, denies any cough Chest x-ray showed left lower lobe pneumonia Continue IV cefepime, vancomycin as above  Acute on chronic blood loss microcytic anemia Hemoglobin at baseline (8-9) ?? Blood mixed with stool noted in ostomy bag FOBT pending Iron panel pending Type and screen done GI consulted  Crohn's disease ?? Flare with multiple draining wounds/fissures CT abdomen showed postsurgical changes related to subtotal colectomy with new right upper quadrant enterostomy. No bowel inflammatory changes or obstruction Not on any current medication GI consulted  Hypertension Hypotension on presentation, improved with IV fluids  CKD stage III Stable at baseline  Asthma Stable Albuterol nebulizer scheduled, as needed  OSA/obesity hypoventilation syndrome Continue CPAP  Morbid obesity Advised lifestyle modification    DVT prophylaxis: Lovenox  Code Status: Full  Family Communication: None at bedside  Disposition Plan: Home  Consults called: GI  Admission status: Inpatient     Alma Friendly MD [facility]   If 7PM-7AM, please contact night-coverage [email or web address] Password [email or web address]  05/30/2018, 2:07 PM

## 2018-05-30 NOTE — ED Notes (Signed)
Bed: OU51 Expected date:  Expected time:  Means of arrival:  Comments: 37 yr old female weakness

## 2018-05-30 NOTE — Progress Notes (Addendum)
NT in for routine VS. HR 154-156 sustained for several minutes with no fluctuation. BP stable. O2 sats 96-100%. Denies chest pain. Apical and radial pulse notably tachy. EKG obtained. First capture noted Sinus Tach w/HR 154. Second capture noted Supraventricular Tachycardia w/HR 155. RRT/ICCU charge RN aware via phone of tachycardic episode.

## 2018-05-30 NOTE — Progress Notes (Addendum)
RRT/ICCU charge RN at bedside. Update given. Pt remains calm with reassurance given verbally. Continues to deny chest discomfort of pain.

## 2018-05-30 NOTE — ED Triage Notes (Addendum)
Per ems: pt coming from home c/o dizziness that started around 4-5 pm. Initial bp was 80/50 but was last 90/56 with ems using left forearm. No n/v/d  200 mL of NS given by EMS

## 2018-05-30 NOTE — ED Provider Notes (Signed)
Patient sent to me by Dr. Eulas Post, pending abdominal CT which did not show any acute findings.  Patient does not endorse increased dyspnea as well as cough.  Chest x-ray consistent with pneumonia.  Dr. Leonette Monarch treated patient with IV antibiotics.  Patient continues to endorse weakness.  She was hypotensive that was treated with IV fluids.  Will admit to the hospitalist service   Lacretia Leigh, MD 05/30/18 1027

## 2018-05-30 NOTE — Progress Notes (Signed)
Pharmacy Antibiotic Note  Dominique [name] is a 37 y.o. female admitted on 05/30/2018 with cellulitis.  Pharmacy has been consulted for vancomycin and cefepime dosing.  Plan:  Cefepime 2 gr IV q8h   Vancomycin 2000 mg IV x1, then 1500 mg IV q12h  Metronidazole 500 mg IV q8h ( MD)    Height: 5' 2"  (157.5 cm) Weight: (!) 359 lb 5.6 oz (163 kg) IBW/kg (Calculated) : 50.1  Temp (24hrs), Avg:98 F (36.7 C), Min:98 F (36.7 C), Max:98 F (36.7 C)  Recent Labs  Lab 05/30/18 0245 05/30/18 0355  WBC 11.7*  --   CREATININE 1.09*  --   LATICACIDVEN  --  1.03    Estimated Creatinine Clearance: 106.3 mL/min (A) (by C-G formula based on SCr of 1.09 mg/dL (H)).    Allergies  Allergen Reactions  . Other Shortness Of Breath and Swelling    Tree nuts  . Penicillins Other (See Comments)    Unknown childhood allergy Has patient had a PCN reaction causing immediate rash, facial/tongue/throat swelling, SOB or lightheadedness with hypotension: Unknown Has patient had a PCN reaction causing severe rash involving mucus membranes or skin necrosis: Unknown Has patient had a PCN reaction that required hospitalization: Unknown Has patient had a PCN reaction occurring within the last 10 years: Unknown If all of the above answers are "NO", then may proceed with Cephalosporin use.     Antimicrobials this admission:  9/7 ceftriaxone x1 9/7 vancomycin >>  9/7 cefepime >>  9/7 metronidazole >>   Dose adjustments this admission:   Microbiology results: 9/7 BCx:    Thank you for allowing pharmacy to be a part of this patient's care.   Royetta Asal, PharmD, BCPS Pager [phone] 05/30/2018 12:51 PM

## 2018-05-30 NOTE — ED Notes (Signed)
Patient at CT

## 2018-05-30 NOTE — ED Notes (Signed)
Patient transported to X-ray 

## 2018-05-30 NOTE — ED Notes (Signed)
Pt requesting pain medication.  

## 2018-05-30 NOTE — ED Notes (Signed)
This nurse and Darcella Cheshire, RN stuck patient 2x without any success.

## 2018-05-30 NOTE — ED Notes (Addendum)
Pt stated that she has not had anything to eat or drink in the past few days due to not feeling well with s/s of head cold and ears feeling clogged

## 2018-05-30 NOTE — ED Notes (Signed)
Patient in CT

## 2018-05-30 NOTE — Progress Notes (Signed)
Pt refused cpap

## 2018-05-30 NOTE — ED Provider Notes (Signed)
[facility] DEPT Provider Note  CSN: [id number] Arrival date & time: 05/30/18 0142  Chief Complaint(s) Dizziness  HPI Dominique [name] is a 37 y.o. female with extensive past medical history listed below including Crohn's who presents to the emergency department with several days of orthostasis.  Patient reports that she is been in bed for the most part of the week with minimal oral intake.  States that she still taking her pain medicine.  States that she goes from the bed to the bathroom.  Endorsed fevers and chills.  Endorses fatigue and myalgias.  No coughing or congestion.  Endorses abdominal discomfort.  Denies any increase ostomy output.  No dysuria.  Endorses nausea without emesis.  Patient reports that her orthostasis became significantly worse several hours ago.  Called EMS who noted the patient's blood pressures were in the 80s.  Patient was given small IV fluid bolus.  HPI  Past Medical History Past Medical History:  Diagnosis Date  . Acute acalculous cholecystitis 11/16/2014  . Anal fistula   . Anemia   . Anxiety   . Asthma   . CHF (congestive heart failure) ([place])   . Complication of anesthesia    states she had a problem staying awake after her c-section, was [facility] from [facility]  . Coronary artery disease   . Diverticulitis of colon 11/17/2014  . Headache    used to have migraines  . Hepatic steatosis 11/17/2014  . Herpes   . Hypertension   . Morbid obesity ([place]) 03/22/2009   Qualifier: Diagnosis of  By: Ronnald Ramp CMA, Chemira    . Neuropathy   . OSA (obstructive sleep apnea) 05/02/2014  . Seasonal allergies 06/13/2014  . Sickle cell anemia ([place])    She states she " has the trait" (07/20/15)  . Sleep apnea    uses Bipap  . Thyromegaly 05/02/2014   Patient Active Problem List   Diagnosis Date Noted  . AKI (acute kidney injury) ([place]) 04/20/2018  . Abnormal TSH 04/20/2018  . Redness of both eyes 04/20/2018  . Eye pain, right  04/20/2018  . Multiple wounds of skin 04/19/2018  . 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 Colostomy in place [facility]) 01/06/2017  . 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 Infliximab (Remicade) long-term use 01/06/2017  . Crohn's colitis, with abscess ([place]) 01/06/2017  . Abdominal abscess 12/25/2016  . Chronic anemia 12/25/2016  . Hypokalemia 07/21/2015  . Adjustment disorder with mixed anxiety and depressed mood   . Physical deconditioning 06/26/2015  . Severe left ventricular systolic dysfunction 61/60/7371  . Pulmonary hypertension ([place])   . Crohn's colitis with perforation s/p left colectomy/colostomy 2016   . OSA on CPAP   . Obesity hypoventilation syndrome ([place])   . Pressure ulcer 06/16/2015  . Protein-calorie malnutrition, severe ([place]) 06/10/2015  . Gastritis 04/07/2015  . Iron deficiency anemia 04/07/2015  . Sinus tachycardia ([place]) 12/29/2014  . Lumbar back pain 12/06/2014  . Rectal bleeding 11/19/2014  . Diverticulitis of colon 11/17/2014  . Hepatic steatosis 11/17/2014  . Seasonal allergies 06/13/2014  . Thyromegaly 05/02/2014  . AMENORRHEA 02/07/2010  . Morbid obesity- BMI 67.5 03/22/2009  . Sickle cell trait ([place]) 10/20/2007   Home Medication(s) Prior to Admission medications   Medication Sig Start Date End Date Taking? Authorizing Provider  acetaminophen (TYLENOL) 500 MG tablet Take 1,000-2,000 mg by mouth every 6 (six) hours as needed for mild pain.    Yes [provider]  ibuprofen (ADVIL,MOTRIN) 200  MG tablet Take 800 mg by mouth every 6 (six) hours as needed for moderate pain.   Yes [provider]  tiZANidine (ZANAFLEX) 4 MG tablet Take 4 mg by mouth every 8 (eight) hours as needed for muscle spasms.  05/14/18  Yes [provider]  cephALEXin (KEFLEX) 500 MG capsule Take 1 capsule (500 mg total) by mouth 3 (three) times daily. Patient not taking: Reported on 05/30/2018 05/01/18   Jola Schmidt, MD    DULoxetine (CYMBALTA) 20 MG capsule Take 1 capsule (20 mg total) by mouth 2 (two) times daily. Patient not taking: Reported on 05/30/2018 04/23/18 04/23/19  Roxan Hockey, MD  ferrous sulfate 325 (65 FE) MG tablet 1 tablet twice a day with meals Patient not taking: Reported on 05/30/2018 04/23/18   Roxan Hockey, MD  fluconazole (DIFLUCAN) 200 MG tablet Take 1 tablet (200 mg total) by mouth daily. Patient not taking: Reported on 05/30/2018 04/23/18   Roxan Hockey, MD  Ganciclovir (ZIRGAN) 0.15 % GEL Place 1 drop into both eyes 5 (five) times daily. Patient not taking: Reported on 05/30/2018 04/23/18   Roxan Hockey, MD  HYDROcodone-acetaminophen (NORCO/VICODIN) 5-325 MG tablet Take 1 tablet by mouth every 4 (four) hours as needed for moderate pain. Patient not taking: Reported on 05/30/2018 05/01/18   Jola Schmidt, MD  neomycin-polymyxin b-dexamethasone (MAXITROL) 3.5-10000-0.1 SUSP Place 1 drop into both eyes 5 (five) times daily. Patient not taking: Reported on 05/30/2018 04/23/18   Roxan Hockey, MD  ondansetron (ZOFRAN ODT) 8 MG disintegrating tablet Take 1 tablet (8 mg total) by mouth every 8 (eight) hours as needed for nausea or vomiting. Patient not taking: Reported on 05/30/2018 05/01/18   Jola Schmidt, MD  oxyCODONE-acetaminophen (PERCOCET/ROXICET) 5-325 MG tablet Take 1 tablet by mouth every 6 (six) hours as needed for severe pain. Patient not taking: Reported on 05/30/2018 04/23/18   Roxan Hockey, MD  phenazopyridine (PYRIDIUM) 200 MG tablet Take 1 tablet (200 mg total) by mouth 3 (three) times daily. Patient not taking: Reported on 05/30/2018 05/01/18   Jola Schmidt, MD  [facility] HFA 108 [phone] Base) MCG/ACT inhaler Inhale 2 puffs into the lungs 4 (four) times daily as needed for wheezing or shortness of breath. Patient not taking: Reported on 05/30/2018 04/23/18   Roxan Hockey, MD                                                                                                                                     Past Surgical History Past Surgical History:  Procedure Laterality Date  . c section 2011  2011  . COLONOSCOPY WITH PROPOFOL N/A 12/27/2015   Procedure: COLONOSCOPY WITH PROPOFOL;  Surgeon: Wonda Horner, MD;  [facility] ENDOSCOPY;  Service: Endoscopy;  Laterality: N/A;  . COLOSTOMY N/A 06/11/2015   Procedure:  CREATION OF COLOSTOMY;  Surgeon: Georganna Skeans, MD;  Location: [facility];  Service: General;  Laterality: N/A;  . ESOPHAGOGASTRODUODENOSCOPY N/A  07/19/2015   Procedure: ESOPHAGOGASTRODUODENOSCOPY (EGD);  Surgeon: Clarene Essex, MD;  Location: [facility] ENDOSCOPY;  Service: Endoscopy;  Laterality: N/A;  . FLEXIBLE SIGMOIDOSCOPY N/A 06/05/2015   Procedure: FLEXIBLE SIGMOIDOSCOPY;  Surgeon: Ronald Lobo, MD;  Location: [facility] ENDOSCOPY;  Service: Endoscopy;  Laterality: N/A;  . LAPAROTOMY N/A 06/09/2015   Procedure: EXPLORATORY LAPAROTOMY, DRAINAGE OF INTRAABDOMINAL ABSCESS, PARTIAL COLON RESECTION,  APPLICATION OF WOUND VAC;  Surgeon: Georganna Skeans, MD;  Location: [facility];  Service: General;  Laterality: N/A;  . LAPAROTOMY N/A 06/11/2015   Procedure: RE-EXPLORATION OF ABDOMEN;  Surgeon: Georganna Skeans, MD;  Location: [facility] OR;  Service: General;  Laterality: N/A;   Family History Family History  Problem Relation Age of Onset  . Hypertension Mother   . Allergies Mother   . Hypertension Father   . Cancer Paternal Grandmother        lung  . Hypertension Sister   . Asthma Brother   . Diabetes Maternal Grandmother   . Hypertension Maternal Grandmother   . Hypertension Maternal Grandfather   . Emphysema Paternal Grandfather     Social History Social History   Tobacco Use  . Smoking status: Current Some Day Smoker    Packs/day: 0.50    Years: 4.00    Pack years: 2.00    Types: Cigarettes    Start date: 07/24/2014    Last attempt to quit: 05/27/2015    Years since quitting: 3.0  . Smokeless tobacco: Never Used  Substance Use Topics  . Alcohol use: No    Alcohol/week: 0.0 standard drinks   . Drug use: No   Allergies Other and Penicillins  Review of Systems Review of Systems All other systems are reviewed and are negative for acute change except as noted in the HPI  Physical Exam Vital Signs  I have reviewed the triage vital signs BP (!) 92/54   Pulse 78   Temp 98 F (36.7 C) (Oral)   Resp 20   Ht 5' 2"  (1.575 m)   Wt (!) 163 kg   SpO2 95%   BMI 65.73 kg/m    Physical Exam  Constitutional: She is oriented to person, place, and time. She appears well-developed and well-nourished. No distress.  HENT:  Head: Normocephalic and atraumatic.  Nose: Nose normal.  Eyes: Pupils are equal, round, and reactive to light. EOM are normal. Right eye exhibits no discharge. Left eye exhibits no discharge. Right conjunctiva is injected. Left conjunctiva is injected. No scleral icterus.  Neck: Normal range of motion. Neck supple.  Cardiovascular: Normal rate and regular rhythm. Exam reveals no gallop and no friction rub.  No murmur heard. Pulmonary/Chest: Effort normal and breath sounds normal. No stridor. No respiratory distress. She has no rales.  Abdominal: Soft. She exhibits no distension. There is generalized tenderness. There is no rebound and no guarding.    Musculoskeletal: She exhibits no edema or tenderness.  Neurological: She is alert and oriented to person, place, and time.  Skin: Skin is warm and dry. No rash noted. She is not diaphoretic. No erythema.  Psychiatric: She has a normal mood and affect.  Vitals reviewed.   ED Results and Treatments Labs (all labs ordered are listed, but only abnormal results are displayed) Labs Reviewed  COMPREHENSIVE METABOLIC PANEL - Abnormal; Notable for the following components:      Result Value   Chloride 113 (*)    CO2 18 (*)    Creatinine, Ser 1.09 (*)    Calcium 8.5 (*)  Albumin 2.3 (*)    AST 14 (*)    All other components within normal limits  CBC WITH DIFFERENTIAL/PLATELET - Abnormal; Notable for the  following components:   WBC 11.7 (*)    Hemoglobin 8.6 (*)    HCT 28.9 (*)    MCV 71.0 (*)    MCH 21.1 (*)    MCHC 29.8 (*)    RDW 21.1 (*)    Platelets 517 (*)    Neutro Abs 9.1 (*)    Monocytes Absolute 1.3 (*)    All other components within normal limits  CULTURE, BLOOD (ROUTINE X 2)  CULTURE, BLOOD (ROUTINE X 2)  LIPASE, BLOOD  URINALYSIS, ROUTINE W REFLEX MICROSCOPIC  I-STAT CG4 LACTIC ACID, ED  I-STAT CG4 LACTIC ACID, ED                                                                                                                         EKG  EKG Interpretation  Date/Time:  Saturday May 30 2018 03:53:57 EDT Ventricular Rate:  79 PR Interval:    QRS Duration: 104 QT Interval:  358 QTC Calculation: 411 R Axis:   -21 Text Interpretation:  Sinus rhythm Borderline left axis deviation Borderline T wave abnormalities No significant change since last tracing Confirmed by Addison Lank [phone]) on 05/30/2018 3:56:30 AM      Radiology Dg Chest 2 View  Result Date: 05/30/2018 CLINICAL DATA:  Fever EXAM: CHEST - 2 VIEW COMPARISON:  None. FINDINGS: Retrocardiac opacity suspicious for left lower lobe pneumonia. Heart size is top normal and stable. Nonaneurysmal thoracic aorta. No acute osseous abnormality. IMPRESSION: Retrocardiac opacity suspicious for left lower lobe pneumonia. Electronically Signed   By: Ashley Royalty M.D.   On: 05/30/2018 03:27   Pertinent labs & imaging results that were available during my care of the patient were reviewed by me and considered in my medical decision making (see chart for details).  Medications Ordered in ED Medications  iopamidol (ISOVUE-300) 61 % injection 30 mL (has no administration in time range)  iopamidol (ISOVUE-300) 61 % injection (has no administration in time range)  sodium chloride 0.9 % bolus 1,000 mL (0 mLs Intravenous Stopped 05/30/18 0620)  fentaNYL (SUBLIMAZE) injection 50 mcg (50 mcg Intravenous Given 05/30/18 0356)   fentaNYL (SUBLIMAZE) injection 50 mcg (50 mcg Intravenous Given 05/30/18 0641)  cefTRIAXone (ROCEPHIN) 1 g in sodium chloride 0.9 % 100 mL IVPB (0 g Intravenous Stopped 05/30/18 0737)  iopamidol (ISOVUE-300) 61 % injection 100 mL (100 mLs Intravenous Contrast Given 05/30/18 0819)  Procedures Procedures  (including critical care time)  Medical Decision Making / ED Course I have reviewed the nursing notes for this encounter and the patient's prior records (if available in EHR or on provided paperwork).    Patient with reported orthostasis.  Decrease oral intake.  Still taking pain medicine.  Noted to have soft blood pressures.  Exam notable for bloody bowel movements and abdominal pain.  Abdominal wall wounds are well-appearing without surrounding erythema concerning for infection.  Suspicion for possible Crohn's flare.  Screening labs obtained with stable leukocytosis and stable hemoglobin.  No significant electrolyte derangements or renal insufficiency.  Chest x-ray obtained revealed possible left lower lobe pneumonia.  Empiric Rocephin given while we await for CT scan.  Pressures have improved following IV fluids.  Patient care turned over to Dr Zenia Resides at 0800. Patient case and results discussed in detail; please see their note for further ED managment.      Final Clinical Impression(s) / ED Diagnoses Final diagnoses:  Fever  Orthostasis  Generalized abdominal pain      This chart was dictated using voice recognition software.  Despite best efforts to proofread,  errors can occur which can change the documentation meaning.  Fatima Blank, MD 05/30/18 [phone]

## 2018-05-30 NOTE — ED Notes (Signed)
EKG given to EDP,Cardama,MD., for review.

## 2018-05-30 NOTE — ED Notes (Signed)
ED TO INPATIENT HANDOFF REPORT  Name/Age/Gender Dominique [name] 37 y.o. female  Code Status Code Status History    Date Active Date Inactive Code Status Order ID Comments User Context   04/19/2018 1247 04/23/2018 1912 Full Code [id number]  Debbe Odea, MD ED   01/06/2017 0612 01/07/2017 0325 Full Code [id number]  Norval Morton, MD ED   12/25/2016 1257 12/29/2016 1749 Full Code [id number]  Debbe Odea, MD Inpatient   12/25/2016 0910 12/25/2016 1257 Full Code [id number]  Debbe Odea, MD ED   07/21/2015 0935 07/25/2015 2218 Full Code [id number]  Samella Parr, NP Inpatient   06/26/2015 1759 07/21/2015 0935 Full Code [id number]  Cathlyn Parsons, PA-C Inpatient   06/04/2015 0757 06/26/2015 1759 Full Code [id number]  Gennaro Africa, MD Inpatient   11/16/2014 2238 11/23/2014 1747 Full Code [id number]  Berle Mull, MD ED      Home/SNF/Other Home. Has a nurse that comes in.   Chief Complaint Dizziness/Pain  Level of Care/Admitting Diagnosis ED Disposition    ED Disposition Condition Comment   Admit  Hospital Area: [facility] [[id number]]  Level of Care: Med-Surg [16]  Diagnosis: Cellulitis [[id number]]  Admitting Physician: Alma Friendly [[id number]]  Attending Physician: Alma Friendly [[id number]]  Estimated length of stay: past midnight tomorrow  Certification:: I certify this patient will need inpatient services for at least 2 midnights  PT Class (Do Not Modify): Inpatient [101]  PT Acc Code (Do Not Modify): Private [1]       Medical History Past Medical History:  Diagnosis Date  . Acute acalculous cholecystitis 11/16/2014  . Anal fistula   . Anemia   . Anxiety   . Asthma   . CHF (congestive heart failure) ([place])   . Complication of anesthesia    states she had a problem staying awake after her c-section, was [facility] from [facility] to [facility]  . Coronary artery disease   . Diverticulitis of colon 11/17/2014  . Headache    used to have migraines  . Hepatic steatosis  11/17/2014  . Herpes   . Hypertension   . Morbid obesity ([place]) 03/22/2009   Qualifier: Diagnosis of  By: Ronnald Ramp CMA, Chemira    . Neuropathy   . OSA (obstructive sleep apnea) 05/02/2014  . Seasonal allergies 06/13/2014  . Sickle cell anemia ([place])    She states she " has the trait" (07/20/15)  . Sleep apnea    uses Bipap  . Thyromegaly 05/02/2014    Allergies Allergies  Allergen Reactions  . Other Shortness Of Breath and Swelling    Tree nuts  . Penicillins Other (See Comments)    Unknown childhood allergy Has patient had a PCN reaction causing immediate rash, facial/tongue/throat swelling, SOB or lightheadedness with hypotension: Unknown Has patient had a PCN reaction causing severe rash involving mucus membranes or skin necrosis: Unknown Has patient had a PCN reaction that required hospitalization: Unknown Has patient had a PCN reaction occurring within the last 10 years: Unknown If all of the above answers are "NO", then may proceed with Cephalosporin use.     IV Location/Drains/Wounds Patient Lines/Drains/Airways Status   Active Line/Drains/Airways    Name:   Placement date:   Placement time:   Site:   Days:   Peripheral IV 05/30/18 Right Hand   05/30/18    0144    Hand   less than 1   Peripheral IV 05/30/18 Left Other (Comment)   05/30/18    0357    Other (Comment)  less than 1   Colostomy LUQ   06/11/15    1700    LUQ   1084   Ileostomy RUQ   04/20/18    0118    RUQ   40   Incision (Closed) 06/11/15 Abdomen Other (Comment)   06/11/15    1515     1084   Wound / Incision (Open or Dehisced) 04/22/18 Other (Comment) Abdomen Mid   04/22/18    2200    Abdomen   38          Labs/Imaging Results for orders placed or performed during the hospital encounter of 05/30/18 (from the past 48 hour(s))  Comprehensive metabolic panel     Status: Abnormal   Collection Time: 05/30/18  2:45 AM  Result Value Ref Range   Sodium 140 135 - 145 mmol/L   Potassium 3.7 3.5 - 5.1 mmol/L    Chloride 113 (H) 98 - 111 mmol/L   CO2 18 (L) 22 - 32 mmol/L   Glucose, Bld 92 70 - 99 mg/dL   BUN 14 6 - 20 mg/dL   Creatinine, Ser 1.09 (H) 0.44 - 1.00 mg/dL   Calcium 8.5 (L) 8.9 - 10.3 mg/dL   Total Protein 6.7 6.5 - 8.1 g/dL   Albumin 2.3 (L) 3.5 - 5.0 g/dL   AST 14 (L) 15 - 41 U/L   ALT 8 0 - 44 U/L   Alkaline Phosphatase 82 38 - 126 U/L   Total Bilirubin 0.5 0.3 - 1.2 mg/dL   GFR calc non Af Amer >60 >60 mL/min   GFR calc Af Amer >60 >60 mL/min    Comment: (NOTE) The eGFR has been calculated using the CKD EPI equation. This calculation has not been validated in all clinical situations. eGFR's persistently <60 mL/min signify possible Chronic Kidney Disease.    Anion gap 9 5 - 15    Comment: Performed at [facility], [place] [address], [place], [place] [address]  CBC WITH DIFFERENTIAL     Status: Abnormal   Collection Time: 05/30/18  2:45 AM  Result Value Ref Range   WBC 11.7 (H) 4.0 - 10.5 K/uL   RBC 4.07 3.87 - 5.11 MIL/uL   Hemoglobin 8.6 (L) 12.0 - 15.0 g/dL   HCT 28.9 (L) 36.0 - 46.0 %   MCV 71.0 (L) 78.0 - 100.0 fL   MCH 21.1 (L) 26.0 - 34.0 pg   MCHC 29.8 (L) 30.0 - 36.0 g/dL   RDW 21.1 (H) 11.5 - 15.5 %   Platelets 517 (H) 150 - 400 K/uL   Neutrophils Relative % 78 %   Lymphocytes Relative 9 %   Monocytes Relative 11 %   Eosinophils Relative 2 %   Basophils Relative 0 %   Neutro Abs 9.1 (H) 1.7 - 7.7 K/uL   Lymphs Abs 1.1 0.7 - 4.0 K/uL   Monocytes Absolute 1.3 (H) 0.1 - 1.0 K/uL   Eosinophils Absolute 0.2 0.0 - 0.7 K/uL   Basophils Absolute 0.0 0.0 - 0.1 K/uL   RBC Morphology POLYCHROMASIA PRESENT     Comment: ELLIPTOCYTES TEARDROP CELLS Performed at [facility] [place] [address], [place], [place] [address]   Lipase, blood     Status: None   Collection Time: 05/30/18  2:45 AM  Result Value Ref Range   Lipase 24 11 - 51 U/L    Comment: Performed at [facility], [place] [address], [place], [place]  [address]  I-Stat CG4 Lactic  Acid, ED  (not at  [facility])     Status: None   Collection Time: 05/30/18  3:55 AM  Result Value Ref Range   Lactic Acid, Venous 1.03 0.5 - 1.9 mmol/L   Dg Chest 2 View  Result Date: 05/30/2018 CLINICAL DATA:  Fever EXAM: CHEST - 2 VIEW COMPARISON:  None. FINDINGS: Retrocardiac opacity suspicious for left lower lobe pneumonia. Heart size is top normal and stable. Nonaneurysmal thoracic aorta. No acute osseous abnormality. IMPRESSION: Retrocardiac opacity suspicious for left lower lobe pneumonia. Electronically Signed   By: Ashley Royalty M.D.   On: 05/30/2018 03:27   Ct Abdomen Pelvis W Contrast  Result Date: 05/30/2018 CLINICAL DATA:  Abdominal discomfort with fever and chills. Nausea. Poor oral intake over the past few days. History of Crohn's disease. EXAM: CT ABDOMEN AND PELVIS WITH CONTRAST TECHNIQUE: Multidetector CT imaging of the abdomen and pelvis was performed using the standard protocol following bolus administration of intravenous contrast. CONTRAST:  137m ISOVUE-300 IOPAMIDOL (ISOVUE-300) INJECTION 61% COMPARISON:  CT abdomen pelvis dated January 06, 2017. FINDINGS: Lower chest: No acute abnormality. Hepatobiliary: No focal liver abnormality is seen. No gallstones, gallbladder wall thickening, or biliary dilatation. Pancreas: Unremarkable. No pancreatic ductal dilatation or surrounding inflammatory changes. Spleen: Normal in size without focal abnormality. Adrenals/Urinary Tract: Adrenal glands are unremarkable. Kidneys are normal, without renal calculi, focal lesion, or hydronephrosis. Bladder is unremarkable. Stomach/Bowel: The stomach is within normal limits. Postsurgical changes related to interval right hemicolectomy. Prior partial left-sided colectomy. The residual sigmoid colon and rectum are unchanged. New right upper quadrant enterostomy. Several small bowel loops are adhered to the anterior abdominal wall, consistent with adhesions. No bowel wall thickening,  distention, or surrounding inflammatory changes. Vascular/Lymphatic: No significant vascular findings are present. No enlarged abdominal or pelvic lymph nodes. Reproductive: Uterus and bilateral adnexa are unremarkable. Other: No free fluid or pneumoperitoneum. Small fat containing parastomal hernia. Musculoskeletal: No acute or significant osseous findings. IMPRESSION: 1. Postsurgical changes related to subtotal colectomy with new right upper quadrant enterostomy. No bowel inflammatory changes or obstruction. 2. Small fat-containing parastomal hernia. Electronically Signed   By: WTitus DubinM.D.   On: 05/30/2018 10:11    Pending Labs Unresulted Labs (From admission, onward)    Start     Ordered   05/30/18 1136  Iron and TIBC  Once,   R     05/30/18 1136   05/30/18 1136  Ferritin  Once,   R     05/30/18 1136   05/30/18 1136  Type and screen [place] Once,   R    Comments:  [place]   05/30/18 1136   05/30/18 1135  Occult blood card to lab, stool RN will collect  STAT,   R    Question:  Specimen to be collected by?  Answer:  RN will collect   05/30/18 1136   05/30/18 0245  Blood Culture (routine x 2)  BLOOD CULTURE X 2,   STAT     05/30/18 0245   05/30/18 0245  Urinalysis, Routine w reflex microscopic  STAT,   STAT     05/30/18 0245   Signed and Held  Basic metabolic panel  Tomorrow morning,   R     Signed and Held   Signed and Held  CBC  Tomorrow morning,   R     Signed and Held          Vitals/Pain Today's Vitals   05/30/18 0730  05/30/18 0740 05/30/18 0926 05/30/18 0930  BP: (!) 121/50   (!) 114/50  Pulse: 91   98  Resp: 15   14  Temp:      TempSrc:      SpO2: 100%   100%  Weight:      Height:      PainSc:  8  8      Isolation Precautions No active isolations  Medications Medications  iopamidol (ISOVUE-300) 61 % injection 30 mL (has no administration in time range)  iopamidol (ISOVUE-300) 61 % injection (has no  administration in time range)  tiZANidine (ZANAFLEX) tablet 4 mg (has no administration in time range)  morphine 2 MG/ML injection 2 mg (has no administration in time range)  sodium chloride 0.9 % bolus 1,000 mL (0 mLs Intravenous Stopped 05/30/18 0620)  fentaNYL (SUBLIMAZE) injection 50 mcg (50 mcg Intravenous Given 05/30/18 0356)  fentaNYL (SUBLIMAZE) injection 50 mcg (50 mcg Intravenous Given 05/30/18 0641)  cefTRIAXone (ROCEPHIN) 1 g in sodium chloride 0.9 % 100 mL IVPB (0 g Intravenous Stopped 05/30/18 0737)  iopamidol (ISOVUE-300) 61 % injection 100 mL (100 mLs Intravenous Contrast Given 05/30/18 0819)  morphine 4 MG/ML injection 4 mg (4 mg Intravenous Given 05/30/18 0855)  LORazepam (ATIVAN) injection 0.5 mg (0.5 mg Intravenous Given 05/30/18 0924)  morphine 4 MG/ML injection 4 mg (4 mg Intravenous Given 05/30/18 1054)    Mobility Up to restroom

## 2018-05-31 DIAGNOSIS — I951 Orthostatic hypotension: Secondary | ICD-10-CM

## 2018-05-31 DIAGNOSIS — D62 Acute posthemorrhagic anemia: Secondary | ICD-10-CM

## 2018-05-31 DIAGNOSIS — K50118 Crohn's disease of large intestine with other complication: Secondary | ICD-10-CM

## 2018-05-31 DIAGNOSIS — J189 Pneumonia, unspecified organism: Secondary | ICD-10-CM

## 2018-05-31 DIAGNOSIS — R1084 Generalized abdominal pain: Secondary | ICD-10-CM

## 2018-05-31 DIAGNOSIS — R238 Other skin changes: Secondary | ICD-10-CM

## 2018-05-31 LAB — CBC
HEMATOCRIT: 27.6 % — AB (ref 36.0–46.0)
Hemoglobin: 8.1 g/dL — ABNORMAL LOW (ref 12.0–15.0)
MCH: 21.1 pg — ABNORMAL LOW (ref 26.0–34.0)
MCHC: 29.3 g/dL — AB (ref 30.0–36.0)
MCV: 72.1 fL — AB (ref 78.0–100.0)
PLATELETS: 524 10*3/uL — AB (ref 150–400)
RBC: 3.83 MIL/uL — ABNORMAL LOW (ref 3.87–5.11)
RDW: 21.3 % — ABNORMAL HIGH (ref 11.5–15.5)
WBC: 10 10*3/uL (ref 4.0–10.5)

## 2018-05-31 LAB — BASIC METABOLIC PANEL
Anion gap: 7 (ref 5–15)
BUN: 12 mg/dL (ref 6–20)
CHLORIDE: 114 mmol/L — AB (ref 98–111)
CO2: 20 mmol/L — ABNORMAL LOW (ref 22–32)
CREATININE: 1.06 mg/dL — AB (ref 0.44–1.00)
Calcium: 8.2 mg/dL — ABNORMAL LOW (ref 8.9–10.3)
GFR calc Af Amer: >60 mL/min (ref >60)
GFR calc non Af Amer: >60 mL/min (ref >60)
GLUCOSE: 92 mg/dL (ref 70–99)
Potassium: 3.7 mmol/L (ref 3.5–5.1)
SODIUM: 141 mmol/L (ref 135–145)

## 2018-05-31 LAB — ABO/RH: ABO/RH(D): O POS

## 2018-05-31 MED ORDER — TOBRAMYCIN 0.3 % OP SOLN
2.0000 [drp] | Freq: Four times a day (QID) | OPHTHALMIC | Status: AC
  Administered 2018-05-31 – 2018-06-03 (×11): 2 [drp] via OPHTHALMIC
  Filled 2018-05-31: qty 5

## 2018-05-31 MED ORDER — HYDROMORPHONE HCL 1 MG/ML IJ SOLN
1.0000 mg | INTRAMUSCULAR | Status: DC | PRN
  Administered 2018-05-31 – 2018-06-03 (×13): 1 mg via INTRAVENOUS
  Filled 2018-05-31 (×15): qty 1

## 2018-05-31 MED ORDER — IPRATROPIUM-ALBUTEROL 0.5-2.5 (3) MG/3ML IN SOLN
3.0000 mL | Freq: Two times a day (BID) | RESPIRATORY_TRACT | Status: DC
  Administered 2018-05-31 – 2018-06-01 (×3): 3 mL via RESPIRATORY_TRACT
  Filled 2018-05-31 (×3): qty 3

## 2018-05-31 MED ORDER — FERROUS SULFATE 325 (65 FE) MG PO TABS
325.0000 mg | ORAL_TABLET | Freq: Every day | ORAL | Status: DC
  Administered 2018-05-31: 325 mg via ORAL
  Filled 2018-05-31 (×2): qty 1

## 2018-05-31 NOTE — Significant Event (Signed)
Rapid Response Event Note  Overview: Time Called: 2308 Arrival Time: 2314 Event Type: Cardiac  Initial Focused Assessment: upon entering room pt lying in bed with eyes closed, not seeming to be in any distress.  Pt denying CP.    Interventions: pt placed on monitor HR 150's,  EKG, 5 mg iv lopressor x 1, and 1 liter NS x 1 per MD orders. Pt HR normalized in the 90's SR see VS flow sheet for complete VS.   Plan of Care (if not transferred):  Event Summary: Name of Physician Notified: Vertis Kelch, NP at 2334    at    Outcome: Stayed in room and stabalized  Event End Time: 0018  Dyann Ruddle

## 2018-05-31 NOTE — Consult Note (Addendum)
Consultation  Referring Provider: Dr. Nevada Crane      Primary Care Physician:  Benito Mccreedy, MD Primary Gastroenterologist: Althia Forts        Reason for Consultation: Anemia,?           HPI:   Dominique [name] is a 37 y.o. female with a past medical history significant for Crohn's status post ostomy and poor healing, OSA, morbid obesity and others listed below, who presented to the ER yesterday with a complaint of feeling dizzy, short of breath and a subjective fever and chills.    Today, patient explains that she has not been to see her GI physicians at [facility] since October of last year.  Her last infusion of Remicade was in December.  Tells me this is because it is difficult for her to get out of her house and to walk.  Partly due to her body habitus and the other due to pain that she experiences when walking from what she tells me are fissures near her anus.  Also tells me that she has had issues with her "ileostomy reopening" recently.  She has been to see [facility] surgery on multiple occasions for this recently. (see Care Everywhere)  Associated symptoms include some dizziness and loss of appetite and generally feeling unwell per the patient over the past few months.    Patient denies seeing any change in her ostomy output or seeing any blood, denies abdominal pain, nausea or vomiting.  ED course: Noted to have foul-smelling, multiple wounds located on her abdomen, groin area with some drainage, initially hypotensive which responded to IV fluids, white blood cell count 11.7, hemoglobin 8.6, lactic acid 1.0, chest x-ray with questionable left lower lobe pneumonia, CT abdomen showed no bowel inflammatory changes or obstruction  GI history: 07/22/17 office visit with [facility] gastroenterology: It was noted patient had a history of penetrating Crohn's disease diagnosed in 2016, since she had been seen last was admitted underwent a colectomy with end ileostomy due to complications related to her Crohn's, patient  had no therapy until December 2017 with initiation of infliximab, progression of stenosis/penetrating colonic disease and underwent colectomy in 2018; plan: At that time reload with infliximab 01/08/2017 [facility] colonoscopy: Surgical stoma was retracted, a fistula was found at the surgical stoma immediately upon entering the stoma, benign-appearing, intrinsic severe stenosis assigned to the surgical stoma and was non-reversed, stenosis was approximately 5 cm from stoma opening, diffuse severe inflammation characterized by congestion, friability and serpentine ulcerations at the surgical stoma  Past Medical History:  Diagnosis Date  . Acute acalculous cholecystitis 11/16/2014  . Anal fistula   . Anemia   . Anxiety   . Asthma   . CHF (congestive heart failure) ([place])   . Complication of anesthesia    states she had a problem staying awake after her c-section, was transferred from [facility] to [facility]  . Coronary artery disease   . Diverticulitis of colon 11/17/2014  . Headache    used to have migraines  . Hepatic steatosis 11/17/2014  . Herpes   . Hypertension   . Morbid obesity ([place]) 03/22/2009   Qualifier: Diagnosis of  By: Ronnald Ramp CMA, Chemira    . Neuropathy   . OSA (obstructive sleep apnea) 05/02/2014  . Seasonal allergies 06/13/2014  . Sickle cell anemia ([place])    She states she " has the trait" (07/20/15)  . Sleep apnea    uses Bipap  . Thyromegaly 05/02/2014    Past Surgical History:  Procedure  Laterality Date  . c section 2011  2011  . COLONOSCOPY WITH PROPOFOL N/A 12/27/2015   Procedure: COLONOSCOPY WITH PROPOFOL;  Surgeon: Wonda Horner, MD;  Location: [facility] ENDOSCOPY;  Service: Endoscopy;  Laterality: N/A;  . COLOSTOMY N/A 06/11/2015   Procedure:  CREATION OF COLOSTOMY;  Surgeon: Georganna Skeans, MD;  Location: [place];  Service: General;  Laterality: N/A;  . ESOPHAGOGASTRODUODENOSCOPY N/A 07/19/2015   Procedure: ESOPHAGOGASTRODUODENOSCOPY (EGD);  Surgeon: Clarene Essex, MD;  Location: [facility] ENDOSCOPY;   Service: Endoscopy;  Laterality: N/A;  . FLEXIBLE SIGMOIDOSCOPY N/A 06/05/2015   Procedure: FLEXIBLE SIGMOIDOSCOPY;  Surgeon: Ronald Lobo, MD;  Location: [facility] ENDOSCOPY;  Service: Endoscopy;  Laterality: N/A;  . LAPAROTOMY N/A 06/09/2015   Procedure: EXPLORATORY LAPAROTOMY, DRAINAGE OF INTRAABDOMINAL ABSCESS, PARTIAL COLON RESECTION,  APPLICATION OF WOUND VAC;  Surgeon: Georganna Skeans, MD;  Location: [place];  Service: General;  Laterality: N/A;  . LAPAROTOMY N/A 06/11/2015   Procedure: RE-EXPLORATION OF ABDOMEN;  Surgeon: Georganna Skeans, MD;  Location: [facility] OR;  Service: General;  Laterality: N/A;    Family History  Problem Relation Age of Onset  . Hypertension Mother   . Allergies Mother   . Hypertension Father   . Cancer Paternal Grandmother        lung  . Hypertension Sister   . Asthma Brother   . Diabetes Maternal Grandmother   . Hypertension Maternal Grandmother   . Hypertension Maternal Grandfather   . Emphysema Paternal Grandfather     Social History   Tobacco Use  . Smoking status: Current Some Day Smoker    Packs/day: 0.50    Years: 4.00    Pack years: 2.00    Types: Cigarettes    Start date: 07/24/2014    Last attempt to quit: 05/27/2015    Years since quitting: 3.0  . Smokeless tobacco: Never Used  Substance Use Topics  . Alcohol use: No    Alcohol/week: 0.0 standard drinks  . Drug use: No    Prior to Admission medications   Medication Sig Start Date End Date Taking? Authorizing Provider  acetaminophen (TYLENOL) 500 MG tablet Take 1,000-2,000 mg by mouth every 6 (six) hours as needed for mild pain.    Yes [provider]  ibuprofen (ADVIL,MOTRIN) 200 MG tablet Take 800 mg by mouth every 6 (six) hours as needed for moderate pain.   Yes [provider]  tiZANidine (ZANAFLEX) 4 MG tablet Take 4 mg by mouth every 8 (eight) hours as needed for muscle spasms.  05/14/18  Yes [provider]  cephALEXin (KEFLEX) 500 MG capsule Take 1 capsule (500  mg total) by mouth 3 (three) times daily. Patient not taking: Reported on 05/30/2018 05/01/18   Jola Schmidt, MD  DULoxetine (CYMBALTA) 20 MG capsule Take 1 capsule (20 mg total) by mouth 2 (two) times daily. Patient not taking: Reported on 05/30/2018 04/23/18 04/23/19  Roxan Hockey, MD  ferrous sulfate 325 (65 FE) MG tablet 1 tablet twice a day with meals Patient not taking: Reported on 05/30/2018 04/23/18   Roxan Hockey, MD  fluconazole (DIFLUCAN) 200 MG tablet Take 1 tablet (200 mg total) by mouth daily. Patient not taking: Reported on 05/30/2018 04/23/18   Roxan Hockey, MD  Ganciclovir (ZIRGAN) 0.15 % GEL Place 1 drop into both eyes 5 (five) times daily. Patient not taking: Reported on 05/30/2018 04/23/18   Roxan Hockey, MD  HYDROcodone-acetaminophen (NORCO/VICODIN) 5-325 MG tablet Take 1 tablet by mouth every 4 (four) hours as needed for moderate pain.  Patient not taking: Reported on 05/30/2018 05/01/18   Jola Schmidt, MD  neomycin-polymyxin b-dexamethasone (MAXITROL) 3.5-10000-0.1 SUSP Place 1 drop into both eyes 5 (five) times daily. Patient not taking: Reported on 05/30/2018 04/23/18   Roxan Hockey, MD  ondansetron (ZOFRAN ODT) 8 MG disintegrating tablet Take 1 tablet (8 mg total) by mouth every 8 (eight) hours as needed for nausea or vomiting. Patient not taking: Reported on 05/30/2018 05/01/18   Jola Schmidt, MD  oxyCODONE-acetaminophen (PERCOCET/ROXICET) 5-325 MG tablet Take 1 tablet by mouth every 6 (six) hours as needed for severe pain. Patient not taking: Reported on 05/30/2018 04/23/18   Roxan Hockey, MD  phenazopyridine (PYRIDIUM) 200 MG tablet Take 1 tablet (200 mg total) by mouth 3 (three) times daily. Patient not taking: Reported on 05/30/2018 05/01/18   Jola Schmidt, MD  [facility] HFA 108 [phone] Base) MCG/ACT inhaler Inhale 2 puffs into the lungs 4 (four) times daily as needed for wheezing or shortness of breath. Patient not taking: Reported on 05/30/2018 04/23/18   Roxan Hockey, MD     Current Facility-Administered Medications  Medication Dose Route Frequency Provider Last Rate Last Dose  . 0.9 %  sodium chloride infusion   Intravenous Continuous Alma Friendly, MD   Stopped at 05/31/18 [phone]  . 0.9 %  sodium chloride infusion  250 mL Intravenous PRN Alma Friendly, MD      . acetaminophen (TYLENOL) tablet 650 mg  650 mg Oral Q6H PRN Alma Friendly, MD       Or  . acetaminophen (TYLENOL) suppository 650 mg  650 mg Rectal Q6H PRN Alma Friendly, MD      . albuterol (PROVENTIL) (2.5 MG/3ML) 0.083% nebulizer solution 2.5 mg  2.5 mg Nebulization Q2H PRN Alma Friendly, MD      . ceFEPIme (MAXIPIME) 2 g in sodium chloride 0.9 % 100 mL IVPB  2 g Intravenous Q8H Alma Friendly, MD 200 mL/hr at 05/31/18 0658 2 g at 05/31/18 0658  . enoxaparin (LOVENOX) injection 80 mg  80 mg Subcutaneous Q24H Alma Friendly, MD   80 mg at 05/30/18 2239  . ferrous sulfate tablet 325 mg  325 mg Oral Q breakfast Kayleen Memos, DO   325 mg at 05/31/18 0825  . HYDROcodone-acetaminophen (NORCO/VICODIN) 5-325 MG per tablet 1-2 tablet  1-2 tablet Oral Q4H PRN Alma Friendly, MD   2 tablet at 05/31/18 0542  . iopamidol (ISOVUE-300) 61 % injection 30 mL  30 mL Oral Once PRN Cardama, Grayce Sessions, MD      . ipratropium-albuterol (DUONEB) 0.5-2.5 (3) MG/3ML nebulizer solution 3 mL  3 mL Nebulization TID Alma Friendly, MD   3 mL at 05/31/18 0808  . metroNIDAZOLE (FLAGYL) IVPB 500 mg  500 mg Intravenous Q8H Alma Friendly, MD 100 mL/hr at 05/31/18 0600    . morphine 2 MG/ML injection 2 mg  2 mg Intravenous Q4H PRN Alma Friendly, MD   2 mg at 05/31/18 0934  . ondansetron (ZOFRAN) tablet 4 mg  4 mg Oral Q6H PRN Alma Friendly, MD       Or  . ondansetron [facility]) injection 4 mg  4 mg Intravenous Q6H PRN Alma Friendly, MD   4 mg at 05/31/18 0934  . polyethylene glycol (MIRALAX / GLYCOLAX) packet 17 g  17 g Oral Daily PRN Alma Friendly, MD      . sodium chloride flush (NS) 0.9 % injection 3 mL  3 mL  Intravenous Q12H Alma Friendly, MD      . sodium chloride flush (NS) 0.9 % injection 3 mL  3 mL Intravenous PRN Alma Friendly, MD      . tiZANidine (ZANAFLEX) tablet 4 mg  4 mg Oral Q8H PRN Alma Friendly, MD      . vancomycin (VANCOCIN) 1,500 mg in sodium chloride 0.9 % 500 mL IVPB  1,500 mg Intravenous Q12H Alma Friendly, MD   Stopped at 05/31/18 0345    Allergies as of 05/30/2018 - Review Complete 05/30/2018  Allergen Reaction Noted  . Other Shortness Of Breath and Swelling 11/11/2014  . Penicillins Other (See Comments) 03/22/2009     Review of Systems:    Constitutional: No fever or chills Skin: +lesions on stomach  Cardiovascular: No chest pain Respiratory: +DOE Gastrointestinal: See HPI and otherwise negative Genitourinary: No dysuria  Neurological: +dizziness Musculoskeletal: No new muscle or joint pain Hematologic: No bruising Psychiatric: +anxiety    Physical Exam:  Vital signs in last 24 hours: Temp:  [98.4 F (36.9 C)-98.6 F (37 C)] 98.4 F (36.9 C) (09/08 0525) Pulse Rate:  [95-175] 175 (09/08 0830) Resp:  [11-20] 18 (09/08 0525) BP: (110-131)/(38-75) 121/70 (09/08 0830) SpO2:  [97 %-100 %] 100 % (09/08 0830) Last BM Date: 05/30/18 General:   Pleasant Morbidly Obese AA female appears to be in NAD, Well developed, Well nourished, alert and cooperative Head:  Normocephalic and atraumatic. Eyes:   PEERL, EOMI. No icterus. Conjunctiva erythematous with eye watering. Ears:  Normal auditory acuity. Neck:  Supple Throat: Oral cavity and pharynx without inflammation, swelling or lesion.  Lungs: Respirations even and unlabored. Lungs clear to auscultation bilaterally.   No wheezes, crackles, or rhonchi.  Heart: Normal S1, S2. No MRG. Regular rate and rhythm. No peripheral edema, cyanosis or pallor.  Abdomen:  Morbidly obese, Soft, nondistended, nontender. No rebound  or guarding. Normal bowel sounds. No appreciable masses or hepatomegaly.+multiple wounds with drainage-bandaged with clean bandages, +ostomy with brown liquid stool Rectal:  Not performed.  Msk:  Symmetrical without gross deformities. Peripheral pulses intact.  Extremities:  Without edema, no deformity or joint abnormality.  Neurologic:  Alert and  oriented x4;  grossly normal neurologically. Skin:   +lesions on stomach Psychiatric: Demonstrates good judgement and reason without abnormal affect or behaviors.   LAB RESULTS: Recent Labs    05/30/18 0245 05/31/18 0507  WBC 11.7* 10.0  HGB 8.6* 8.1*  HCT 28.9* 27.6*  PLT 517* 524*   BMET Recent Labs    05/30/18 0245 05/31/18 0507  NA 140 141  K 3.7 3.7  CL 113* 114*  CO2 18* 20*  GLUCOSE 92 92  BUN 14 12  CREATININE 1.09* 1.06*  CALCIUM 8.5* 8.2*   LFT Recent Labs    05/30/18 0245  PROT 6.7  ALBUMIN 2.3*  AST 14*  ALT 8  ALKPHOS 82  BILITOT 0.5   STUDIES: Dg Chest 2 View  Result Date: 05/30/2018 CLINICAL DATA:  Fever EXAM: CHEST - 2 VIEW COMPARISON:  None. FINDINGS: Retrocardiac opacity suspicious for left lower lobe pneumonia. Heart size is top normal and stable. Nonaneurysmal thoracic aorta. No acute osseous abnormality. IMPRESSION: Retrocardiac opacity suspicious for left lower lobe pneumonia. Electronically Signed   By: Ashley Royalty M.D.   On: 05/30/2018 03:27   Ct Abdomen Pelvis W Contrast  Result Date: 05/30/2018 CLINICAL DATA:  Abdominal discomfort with fever and chills. Nausea. Poor oral intake over the past few days. History of Crohn's  disease. EXAM: CT ABDOMEN AND PELVIS WITH CONTRAST TECHNIQUE: Multidetector CT imaging of the abdomen and pelvis was performed using the standard protocol following bolus administration of intravenous contrast. CONTRAST:  159m ISOVUE-300 IOPAMIDOL (ISOVUE-300) INJECTION 61% COMPARISON:  CT abdomen pelvis dated January 06, 2017. FINDINGS: Lower chest: No acute abnormality.  Hepatobiliary: No focal liver abnormality is seen. No gallstones, gallbladder wall thickening, or biliary dilatation. Pancreas: Unremarkable. No pancreatic ductal dilatation or surrounding inflammatory changes. Spleen: Normal in size without focal abnormality. Adrenals/Urinary Tract: Adrenal glands are unremarkable. Kidneys are normal, without renal calculi, focal lesion, or hydronephrosis. Bladder is unremarkable. Stomach/Bowel: The stomach is within normal limits. Postsurgical changes related to interval right hemicolectomy. Prior partial left-sided colectomy. The residual sigmoid colon and rectum are unchanged. New right upper quadrant enterostomy. Several small bowel loops are adhered to the anterior abdominal wall, consistent with adhesions. No bowel wall thickening, distention, or surrounding inflammatory changes. Vascular/Lymphatic: No significant vascular findings are present. No enlarged abdominal or pelvic lymph nodes. Reproductive: Uterus and bilateral adnexa are unremarkable. Other: No free fluid or pneumoperitoneum. Small fat containing parastomal hernia. Musculoskeletal: No acute or significant osseous findings. IMPRESSION: 1. Postsurgical changes related to subtotal colectomy with new right upper quadrant enterostomy. No bowel inflammatory changes or obstruction. 2. Small fat-containing parastomal hernia. Electronically Signed   By: WTitus DubinM.D.   On: 05/30/2018 10:11   Recent BG62and folic acid normal. Iron studies consistent with iron deficiency.   Impression / Plan:   Impression: 1.  Acute on chronic blood loss microcytic anemia: hgb 8.6-->8.1 (baseline 8/9), occult card pending, no visible blood in ostomy at time of exam 2.  Crohn's disease: Ct with no bowel inflammation, pt reports no change in ostomy output, last Remicade dec 2018 3.  Cellulitis of the abdomen and groin: Currently on IV Vancomycin, Cefepime and Metronidazole 4.  CAP: Chest x-ray showed possible left lower lobe  pneumonia, patient continued on IV cefepime and vancomycin 5.  CKD stage III 6.  OSA/obesity hypoventilation syndrome   Plan: 1.  Continue supportive measures 2.  Please await any further recommendations from Dr. DLoletha Carrowlater today.  Thank you for your kind consultation, we will continue to follow.  JLavone NianLWill[facility]night[facility] Center 05/31/2018, 9:58 AM   I have reviewed the entire case in detail with the above APP and discussed the plan in detail.  Therefore, I agree with the diagnoses recorded above. In addition,  I have personally interviewed and examined the patient and have personally reviewed any abdominal/pelvic CT scan images.  Of note, I [facility] with [facility]of [facility] this morning, since an inpatient GI consultation by Dr. MWatt Climesin October 2016 indicates the patient was, at that time, seen by the patient in the clinic setting.  Dr. GPenelope Coopstated that the patient's account has since been sent to collections and she is listed as "inactive" by their practice, though she was seen by [place] an unassigned patient.  My additional thoughts are as follows:  [facility] and [facility]  It appears the patient had a complex case of severe fistula arising and stricturing Crohn's colitis that ultimately required a second surgery of subtotal colectomy with permanent end ileostomy, leaving her with a rectal pouch.  The patient received a single dose of Remicade in December 2018, as a reinstitution of biologic therapy, though the IBD clinic  notes indicate this was being done out of the patient's concern that she might develop new ileal Crohn's disease (which they felt had a 10 to 15% likelihood).  In short, it is not clear that the patient even has active ileal Crohn's at this time.  There is copious nonbloody output in the ileostomy bag now.  I have elected not to remove the ostomy bag to inspect the ostomy  itself, especially considering the severe abdominal wall skin disease and chronic abdominal wound infectious and poor healing issues.  We were mainly consulted because the admitting physician that there may have been blood in the ostomy.  It would not be surprising for her ileostomy output to be heme positive since she may have ulcerating disease there from previously established parastomal hernia.  I also suspect there is an element of anemia of chronic disease given her chronic infectious wound issues.  This patient does not need inpatient endoscopic reevaluation of her Crohn's or institution of any Crohn's disease medicine during this hospital stay.  She needs antibiotic therapy for wound related cellulitis, and specialized wound care both here and at home.  She reportedly has a home care nurse at this. I recommend at least 1 dose of intravenous iron while she is hospitalized.  She needs to reestablish outpatient care with the attending IBD physician at [facility].  Therefore, she does not need clinic follow-up with [place] after this hospitalization.  No further recommendations from the GI service at this time.  Signing off, call as needed.  Total time 60 minutes, extensive complex chart review required to determine patient's history.  Nelida Meuse III Office:[phone]

## 2018-05-31 NOTE — Progress Notes (Addendum)
PROGRESS NOTE  Dominique [name] GGE:[id number] DOB: 1980/11/21 DOA: 05/30/2018 PCP: Benito Mccreedy, MD  HPI/Recap of past 24 hours: Dominique [name] is a 37 y.o. [name] with medical history significant for Crohn's s/p ostomy, asthma, HTN, OSA, morbid obesity, anal fistula presents to the ED complaining of several days of feeling dizzy, shortness of breath, subjective fever/chills. Patient denies any chest pain, cough.  In the ED, patient was noted to have foul smelly multiple wounds located on her abdomen, groin area, with some drainage noted.  Patient reports a nurse comes in to help her take care of the wound.  Patient reported multiple wounds has been ongoing for a couple of months, but has not seen any physician.  Patient has a history of Crohn's disease status post ostomy that was noted to have some blood mixed with stool in the bag.  Patient denies any abdominal pain, nausea/vomiting.  05/31/18: Patient seen and examined at her bedside.  She is in a lot of pain due to cellulitis.  Bilateral eye infection also noted with purulence.  Spoke with ophthalmologist Dr. Jalene [name] who recommended tobramycin 3-4 times a day until she follows up with him outpatient in his office.  Assessment/Plan: Principal Problem:   Cellulitis Active Problems:   Morbid obesity- BMI 67.5   Iron deficiency anemia   Crohn's colitis with perforation s/p left colectomy/colostomy 2016   OSA on CPAP   Obesity hypoventilation syndrome (HCC)   Colostomy in place [facility])   Multiple wounds of skin  Cellulitis of the abdomen, groin Continue IV vancomycin, cefepime, IV metronidazole Obtain MRSA PCR if negative DC IV vancomycin Blood cultures x2 in process Continue gentle IV fluid hydration Wound care consult  CAP Continue IV cefepime Continue breathing treatments O2 supplementation to maintain O2 saturation greater 92%  B/L eye infection Discussed with Dr Posey Pronto  Recommendations for tobramycin drops 3-4 times daily  until pt follows up in his clinic  Acute on chronic blood loss microcytic anemia/iron deficiency anemia Iron studies positive Start ferrous sulfate 325 mg daily GI assessed and signed off Continue to monitor  Crohn's disease CT abdomen showed postsurgical changes related to subtotal colectomy with new right upper quadrant enterostomy. No bowel inflammatory changes or obstruction Not on any current medication GI consulted and signed off  Hypertension Hypotension on presentation, improved with IV fluids  CKD stage III Stable at baseline  Asthma Stable Albuterol nebulizer scheduled, as needed  OSA/obesity hypoventilation syndrome Continue CPAP  Morbid obesity BMI 65 Advised lifestyle modification    Code Status: Full code  Family Communication: None at bedside  Disposition Plan: 1 to 2 days once cellulitis is improved   Consultants:  GI  Curbside with Dr. Jalene [name], ophthalmology  Procedures:  None  Antimicrobials:  IV vancomycin IV cefepime IV Flagyl  DVT prophylaxis: Subcu Lovenox daily   Objective: Vitals:   05/31/18 0809 05/31/18 0819 05/31/18 0826 05/31/18 0830  BP:  (!) 118/53 (!) 115/54 121/70  Pulse:  (!) 105 (!) 151 (!) 175  Resp:      Temp:      TempSrc:      SpO2: 97% 100% 100% 100%  Weight:      Height:        Intake/Output Summary (Last 24 hours) at 05/31/2018 1456 Last data filed at 05/31/2018 1400 Gross per 24 hour  Intake 4555.05 ml  Output 1351 ml  Net 3204.05 ml   Filed Weights   05/30/18 0152  Weight: (!) 163 kg  Exam:  . General: 37 y.o. year-old [name] morbidly obese with BMI of 65 with cellulitis affecting groin and lower abdominal regions . Cardiovascular: Regular rate and rhythm with no rubs or gallops.  No thyromegaly or JVD noted.   Marland Kitchen Respiratory: Clear to auscultation with no wheezes or rales. Good inspiratory effort. . Abdomen: Soft morbidly obese nondistended with normal bowel sounds x4  quadrants.  ostomy bag in place with small amount of stool present.  Diffused cellulitis affecting lower abdomen and groins. . Musculoskeletal: No lower extremity edema. 2/4 pulses in all 4 extremities. Marland Kitchen Psychiatry: Mood is appropriate for condition and setting   Data Reviewed: CBC: Recent Labs  Lab 05/30/18 0245 05/31/18 0507  WBC 11.7* 10.0  NEUTROABS 9.1*  --   HGB 8.6* 8.1*  HCT 28.9* 27.6*  MCV 71.0* 72.1*  PLT 517* 382*   Basic Metabolic Panel: Recent Labs  Lab 05/30/18 0245 05/31/18 0507  NA 140 141  K 3.7 3.7  CL 113* 114*  CO2 18* 20*  GLUCOSE 92 92  BUN 14 12  CREATININE 1.09* 1.06*  CALCIUM 8.5* 8.2*   GFR: Estimated Creatinine Clearance: 109.3 mL/min (A) (by C-G formula based on SCr of 1.06 mg/dL (H)). Liver Function Tests: Recent Labs  Lab 05/30/18 0245  AST 14*  ALT 8  ALKPHOS 82  BILITOT 0.5  PROT 6.7  ALBUMIN 2.3*   Recent Labs  Lab 05/30/18 0245  LIPASE 24   No results for input(s): AMMONIA in the last 168 hours. Coagulation Profile: No results for input(s): INR, PROTIME in the last 168 hours. Cardiac Enzymes: No results for input(s): CKTOTAL, CKMB, CKMBINDEX, TROPONINI in the last 168 hours. BNP (last 3 results) No results for input(s): PROBNP in the last 8760 hours. HbA1C: No results for input(s): HGBA1C in the last 72 hours. CBG: No results for input(s): GLUCAP in the last 168 hours. Lipid Profile: No results for input(s): CHOL, HDL, LDLCALC, TRIG, CHOLHDL, LDLDIRECT in the last 72 hours. Thyroid Function Tests: No results for input(s): TSH, T4TOTAL, FREET4, T3FREE, THYROIDAB in the last 72 hours. Anemia Panel: Recent Labs    05/30/18 1937  FERRITIN 15  TIBC 258  IRON 14*   Urine analysis:    Component Value Date/Time   COLORURINE YELLOW 05/01/2018 0522   APPEARANCEUR TURBID (A) 05/01/2018 0522   LABSPEC 1.025 05/01/2018 0522   PHURINE 5.0 05/01/2018 0522   GLUCOSEU NEGATIVE 05/01/2018 0522   HGBUR LARGE (A)  05/01/2018 0522   BILIRUBINUR NEGATIVE 05/01/2018 0522   KETONESUR 5 (A) 05/01/2018 0522   PROTEINUR 100 (A) 05/01/2018 0522   UROBILINOGEN 0.2 07/22/2015 0405   NITRITE NEGATIVE 05/01/2018 0522   LEUKOCYTESUR LARGE (A) 05/01/2018 0522   Sepsis Labs: @LABRCNTIP (procalcitonin:4,lacticidven:4)  ) Recent Results (from the past 240 hour(s))  Blood Culture (routine x 2)     Status: None (Preliminary result)   Collection Time: 05/30/18  2:45 AM  Result Value Ref Range Status   Specimen Description   Final    BLOOD RIGHT ANTECUBITAL Performed at [facility], [place] [address], [place], [place] [address]    Special Requests   Final    BOTTLES DRAWN AEROBIC AND ANAEROBIC Blood Culture adequate volume Performed at [facility] [address], [place], [place] [address]    Culture   Final    NO GROWTH 1 DAY Performed at [facility], [place] [address], [place], [place] [address]    Report Status PENDING  Incomplete  Blood Culture (routine x 2)     Status: None (Preliminary result)   Collection Time: 05/30/18  7:37 PM  Result Value Ref Range Status   Specimen Description BLOOD RIGHT HAND  Final   Special Requests   Final    BOTTLES DRAWN AEROBIC ONLY Blood Culture results may not be optimal due to an inadequate volume of blood received in culture bottles   Culture PENDING  Incomplete   Report Status PENDING  Incomplete      Studies: No results found.  Scheduled Meds: . enoxaparin (LOVENOX) injection  80 mg Subcutaneous Q24H  . ferrous sulfate  325 mg Oral Q breakfast  . ipratropium-albuterol  3 mL Nebulization BID  . sodium chloride flush  3 mL Intravenous Q12H  . tobramycin  2 drop Both Eyes Q6H    Continuous Infusions: . sodium chloride Stopped (05/31/18 0546)  . sodium chloride    . ceFEPime (MAXIPIME) IV 2 g (05/31/18 1441)  . metronidazole 100 mL/hr at 05/31/18 0600  . vancomycin 1,500 mg (05/31/18 1223)     LOS: 1 day      Kayleen Memos, MD [facility] Pager [phone]  If 7PM-7AM, please contact night-coverage [email or web address] Password [email or web address] 05/31/2018, 2:56 PM

## 2018-06-01 LAB — BASIC METABOLIC PANEL
Anion gap: 5 (ref 5–15)
BUN: 10 mg/dL (ref 6–20)
CALCIUM: 8.2 mg/dL — AB (ref 8.9–10.3)
CO2: 20 mmol/L — ABNORMAL LOW (ref 22–32)
Chloride: 115 mmol/L — ABNORMAL HIGH (ref 98–111)
Creatinine, Ser: 1.04 mg/dL — ABNORMAL HIGH (ref 0.44–1.00)
GFR calc Af Amer: >60 mL/min (ref >60)
Glucose, Bld: 103 mg/dL — ABNORMAL HIGH (ref 70–99)
Potassium: 3.4 mmol/L — ABNORMAL LOW (ref 3.5–5.1)
Sodium: 140 mmol/L (ref 135–145)

## 2018-06-01 LAB — CBC
HEMATOCRIT: 26.1 % — AB (ref 36.0–46.0)
HEMOGLOBIN: 7.7 g/dL — AB (ref 12.0–15.0)
MCH: 21.5 pg — AB (ref 26.0–34.0)
MCHC: 29.5 g/dL — AB (ref 30.0–36.0)
MCV: 72.9 fL — AB (ref 78.0–100.0)
Platelets: 528 10*3/uL — ABNORMAL HIGH (ref 150–400)
RBC: 3.58 MIL/uL — ABNORMAL LOW (ref 3.87–5.11)
RDW: 21.1 % — ABNORMAL HIGH (ref 11.5–15.5)
WBC: 9.5 10*3/uL (ref 4.0–10.5)

## 2018-06-01 LAB — MRSA PCR SCREENING: MRSA BY PCR: NEGATIVE

## 2018-06-01 MED ORDER — METOPROLOL TARTRATE 5 MG/5ML IV SOLN
5.0000 mg | Freq: Once | INTRAVENOUS | Status: AC
  Administered 2018-06-01: 5 mg via INTRAVENOUS
  Filled 2018-06-01: qty 5

## 2018-06-01 MED ORDER — MAGNESIUM SULFATE 2 GM/50ML IV SOLN
2.0000 g | Freq: Once | INTRAVENOUS | Status: AC
  Administered 2018-06-01: 2 g via INTRAVENOUS
  Filled 2018-06-01: qty 50

## 2018-06-01 MED ORDER — METOPROLOL TARTRATE 5 MG/5ML IV SOLN: 5.0000 mg | Freq: Once | INTRAVENOUS | Status: AC

## 2018-06-01 MED ORDER — POTASSIUM CHLORIDE CRYS ER 20 MEQ PO TBCR
40.0000 meq | EXTENDED_RELEASE_TABLET | Freq: Once | ORAL | Status: AC
  Administered 2018-06-01: 40 meq via ORAL
  Filled 2018-06-01: qty 2

## 2018-06-01 MED ORDER — VANCOMYCIN HCL 10 G IV SOLR
1500.0000 mg | Freq: Two times a day (BID) | INTRAVENOUS | Status: DC
  Administered 2018-06-02: 1500 mg via INTRAVENOUS
  Filled 2018-06-01: qty 1500

## 2018-06-01 MED ORDER — METOPROLOL TARTRATE 5 MG/5ML IV SOLN
INTRAVENOUS | Status: AC
  Administered 2018-06-01: 5 mg
  Filled 2018-06-01: qty 5

## 2018-06-01 MED ORDER — LIP MEDEX EX OINT
TOPICAL_OINTMENT | CUTANEOUS | Status: AC
  Administered 2018-06-01: 07:00:00
  Filled 2018-06-01: qty 7

## 2018-06-01 MED ORDER — VANCOMYCIN HCL 10 G IV SOLR: 1500.0000 mg | INTRAVENOUS | Status: DC

## 2018-06-01 MED ORDER — LABETALOL HCL 5 MG/ML IV SOLN
10.0000 mg | Freq: Four times a day (QID) | INTRAVENOUS | Status: DC | PRN
  Filled 2018-06-01: qty 4

## 2018-06-01 NOTE — Progress Notes (Addendum)
PROGRESS NOTE  Dominique [name] ZOX:[id number] DOB: April 03, 1981 DOA: 05/30/2018 PCP: Benito Mccreedy, MD  HPI/Recap of past 24 hours: Dominique [name] is a 37 y.o. female with medical history significant for Crohn's s/p ostomy, asthma, HTN, OSA, morbid obesity, anal fistula presents to the ED complaining of several days of feeling dizzy, shortness of breath, subjective fever/chills. Patient denies any chest pain, cough.  In the ED, patient was noted to have foul smelly multiple wounds located on her abdomen, groin area, with some drainage noted.  Patient reports a nurse comes in to help her take care of the wound.  Patient reported multiple wounds has been ongoing for a couple of months, but has not seen any physician.  Patient has a history of Crohn's disease status post ostomy that was noted to have some blood mixed with stool in the bag.  Patient denies any abdominal pain, nausea/vomiting.  05/31/18: Patient seen and examined at her bedside.  She is in a lot of pain due to cellulitis.  Bilateral eye infection also noted with purulence.  Spoke with ophthalmologist Dr. Jalene Mullet who recommended tobramycin 3-4 times a day until she follows up with him outpatient in his office.  06/01/2018: Patient seen and examined at her bedside.  She reports her pain is well controlled on IV Dilaudid.  She denies any nausea at this time.  Patient has been persistently tachycardic, appears to be in sinus.  IV Lopressor 5 mg given once.  Will transfer to telemetry for monitoring of her heart rate.  Assessment/Plan: Principal Problem:   Cellulitis Active Problems:   Morbid obesity- BMI 67.5   Iron deficiency anemia   Crohn's colitis with perforation s/p left colectomy/colostomy 2016   OSA on CPAP   Obesity hypoventilation syndrome (HCC)   Colostomy in place [facility])   Multiple wounds of skin  Severe cellulitis of the abdomen, groin MRSA screening negative DC IV vancomycin.  Continue IV cefepime, IV metronidazole Blood  cultures x2 no growth to date Continue gentle IV fluid hydration Wound care consult Pain management with IV Dilaudid 0.5 mg as needed every 4 hours for severe pain OxyContin 10 mg twice daily Bowel regimen to avoid opiates induced constipation  Persistent sinus tachycardia Twelve-lead EKG revealed rate of 32 with no specific ST-T changes Lopressor 5 mg IV given once Not on AV nodal blockade medication at home Cardiology consult Transferred to telemetry unit  CAP Continue IV cefepime Continue breathing treatments O2 supplementation to maintain O2 saturation greater 92%  B/L eye infection Discussed with Dr Posey Pronto  Recommendations for tobramycin drops 3-4 times daily until pt follows up in his clinic Continue eyedrops tobramycin every 6 hours  Acute on chronic blood loss microcytic anemia/iron deficiency anemia Iron studies positive Continue ferrous sulfate 325 mg daily GI assessed and signed off Continue to monitor  Crohn's disease CT abdomen showed postsurgical changes related to subtotal colectomy with new right upper quadrant enterostomy. No bowel inflammatory changes or obstruction Not on any current medication GI consulted and signed off  Hypertension Hypotension on presentation, improved with IV fluids  CKD stage III Stable at baseline  Asthma Stable Albuterol nebulizer scheduled, as needed  OSA/obesity hypoventilation syndrome BMI 65 Continue CPAP Weight loss outpatient  Morbid obesity BMI 65 Advised lifestyle modification    Code Status: Full code  Family Communication: None at bedside  Disposition Plan: To home in 1 to 2 days once cellulitis is improved   Consultants:  GI  Curbside with Dr. Jalene Mullet, ophthalmology  Procedures:  None  Antimicrobials:   IV cefepime IV Flagyl  DVT prophylaxis: Subcu Lovenox daily   Objective: Vitals:   06/01/18 0045 06/01/18 0608 06/01/18 0808 06/01/18 0826  BP:  116/80  (!) 126/50    Pulse: (!) 107 (!) 135  (!) 156  Resp:      Temp:  98.7 F (37.1 C)    TempSrc:  Oral    SpO2: 90% 96% 96%   Weight:      Height:        Intake/Output Summary (Last 24 hours) at 06/01/2018 1228 Last data filed at 06/01/2018 0955 Gross per 24 hour  Intake 2961.61 ml  Output 100 ml  Net 2861.61 ml   Filed Weights   05/30/18 0152  Weight: (!) 163 kg    Exam:  . General: 37 y.o. year-old female morbidly obese with BMI of 65.  In no apparent acute distress.  Erythema and purulent drainage noted from sclera bilaterally . Cardiovascular: Tachycardic with regular rhythm with no rubs or gallops.  No thyromegaly or JVD noted. Marland Kitchen Respiratory: Clear to auscultation with no wheezes or rales. Good inspiratory effort. . Abdomen: Soft morbidly obese nondistended with normal bowel sounds x4 quadrants.  ostomy bag in place with small amount of stool present.  Diffused cellulitis affecting lower abdomen and groins. . Musculoskeletal: No lower extremity edema. 2/4 pulses in all 4 extremities. Marland Kitchen Psychiatry: Mood is appropriate for condition and setting   Data Reviewed: CBC: Recent Labs  Lab 05/30/18 0245 05/31/18 0507 06/01/18 0411  WBC 11.7* 10.0 9.5  NEUTROABS 9.1*  --   --   HGB 8.6* 8.1* 7.7*  HCT 28.9* 27.6* 26.1*  MCV 71.0* 72.1* 72.9*  PLT 517* 524* 016*   Basic Metabolic Panel: Recent Labs  Lab 05/30/18 0245 05/31/18 0507 06/01/18 0411  NA 140 141 140  K 3.7 3.7 3.4*  CL 113* 114* 115*  CO2 18* 20* 20*  GLUCOSE 92 92 103*  BUN 14 12 10   CREATININE 1.09* 1.06* 1.04*  CALCIUM 8.5* 8.2* 8.2*   GFR: Estimated Creatinine Clearance: 111.4 mL/min (A) (by C-G formula based on SCr of 1.04 mg/dL (H)). Liver Function Tests: Recent Labs  Lab 05/30/18 0245  AST 14*  ALT 8  ALKPHOS 82  BILITOT 0.5  PROT 6.7  ALBUMIN 2.3*   Recent Labs  Lab 05/30/18 0245  LIPASE 24   No results for input(s): AMMONIA in the last 168 hours. Coagulation Profile: No results for input(s):  INR, PROTIME in the last 168 hours. Cardiac Enzymes: No results for input(s): CKTOTAL, CKMB, CKMBINDEX, TROPONINI in the last 168 hours. BNP (last 3 results) No results for input(s): PROBNP in the last 8760 hours. HbA1C: No results for input(s): HGBA1C in the last 72 hours. CBG: No results for input(s): GLUCAP in the last 168 hours. Lipid Profile: No results for input(s): CHOL, HDL, LDLCALC, TRIG, CHOLHDL, LDLDIRECT in the last 72 hours. Thyroid Function Tests: No results for input(s): TSH, T4TOTAL, FREET4, T3FREE, THYROIDAB in the last 72 hours. Anemia Panel: Recent Labs    05/30/18 1937  FERRITIN 15  TIBC 258  IRON 14*   Urine analysis:    Component Value Date/Time   COLORURINE YELLOW 05/01/2018 0522   APPEARANCEUR TURBID (A) 05/01/2018 0522   LABSPEC 1.025 05/01/2018 0522   PHURINE 5.0 05/01/2018 0522   GLUCOSEU NEGATIVE 05/01/2018 0522   HGBUR LARGE (A) 05/01/2018 0522   BILIRUBINUR NEGATIVE 05/01/2018 0522   KETONESUR 5 (A) 05/01/2018 0522  PROTEINUR 100 (A) 05/01/2018 0522   UROBILINOGEN 0.2 07/22/2015 0405   NITRITE NEGATIVE 05/01/2018 0522   LEUKOCYTESUR LARGE (A) 05/01/2018 0522   Sepsis Labs: @LABRCNTIP (procalcitonin:4,lacticidven:4)  ) Recent Results (from the past 240 hour(s))  Blood Culture (routine x 2)     Status: None (Preliminary result)   Collection Time: 05/30/18  2:45 AM  Result Value Ref Range Status   Specimen Description   Final    BLOOD RIGHT ANTECUBITAL Performed at [facility] [address], [place], [place] [address]    Special Requests   Final    BOTTLES DRAWN AEROBIC AND ANAEROBIC Blood Culture adequate volume Performed at [facility] [address] [place], [place] [address]    Culture   Final    NO GROWTH 2 DAYS Performed at [facility] [address] [place], [place] [address]    Report Status PENDING  Incomplete  Blood Culture (routine x 2)     Status: None (Preliminary  result)   Collection Time: 05/30/18  7:37 PM  Result Value Ref Range Status   Specimen Description BLOOD RIGHT HAND  Final   Special Requests   Final    BOTTLES DRAWN AEROBIC ONLY Blood Culture results may not be optimal due to an inadequate volume of blood received in culture bottles   Culture   Final    NO GROWTH 1 DAY Performed at [facility], [place] [address] [place], [place] [address]    Report Status PENDING  Incomplete  MRSA PCR Screening     Status: None   Collection Time: 06/01/18  6:50 AM  Result Value Ref Range Status   MRSA by PCR NEGATIVE NEGATIVE Final    Comment:        The GeneXpert MRSA Assay (FDA approved for NASAL specimens only), is one component of a comprehensive MRSA colonization surveillance program. It is not intended to diagnose MRSA infection nor to guide or monitor treatment for MRSA infections. Performed at [facility], [place] [address] [place],  [address]       Studies: No results found.  Scheduled Meds: . enoxaparin (LOVENOX) injection  80 mg Subcutaneous Q24H  . ferrous sulfate  325 mg Oral Q breakfast  . ipratropium-albuterol  3 mL Nebulization BID  . sodium chloride flush  3 mL Intravenous Q12H  . tobramycin  2 drop Both Eyes Q6H    Continuous Infusions: . sodium chloride 75 mL/hr at 06/01/18 0500  . sodium chloride    . ceFEPime (MAXIPIME) IV 2 g (06/01/18 0654)  . metronidazole 500 mg (06/01/18 0540)  . vancomycin 1,500 mg (06/01/18 1212)     LOS: 2 days     Kayleen Memos, MD [facility] Pager [phone]  If 7PM-7AM, please contact night-coverage [email or web address] Password [facility] 06/01/2018, 12:28 PM

## 2018-06-01 NOTE — Progress Notes (Signed)
Dr. Silas [place] made aware via phone pt tachy since start of shift with HR 125-135 sustained. Denies CP. No distress. Recent EKG done per order released on previous shift showing normal sinus tachy w/HR 132. See one time order for Lopressor 5 mg. Pt resting in bed currently with no complaints voiced.

## 2018-06-01 NOTE — Progress Notes (Signed)
MD paged due to patient's HR being 155 at rest. Given orders to give 5 mg of IV Lopressor. Will administer Lopressor and continue to monitor. Recommended transferring patient to tele due to her consistent tachycardia to be better observed. MD gave verbal orders to transfer.

## 2018-06-01 NOTE — Consult Note (Addendum)
[place] Nurse ostomy consult note Stoma type/location: RUQ ileostomy Stomal assessment/size: 1 and 3/8 inch oval Peristomal assessment: pink with epithelial buds (patient has been cutting aperture too large) Treatment options for stomal/peristomal skin: skin barrier ring, resizing Output: dark green, brown effluent, pasty Ostomy pouching: 1pc.flat pouch with skin barrier ring Education provided: None.  Patient is in agreement with using Hollister pouches while in house.  She uses ConvaTec at home.  She will attempt to use Lock and Roll, but also has her own clamp at bedside.  3 pouches, 3 rings at bedside with pattern and sizing guide.  Enrolled patient in [place] program: No  WOC Nurse wound consult note Reason for Consult:midline wound Wound type:surgical Pressure Injury POA: N/A Measurement:15cm x 3cm x 0.4cm at the deepest depth  Wound YQI:HKVQ pink, wet Drainage (amount, consistency, odor) light yellow exudate Periwound:with evidence of previous wound healing Dressing procedure/placement/frequency: I have provided Nursing with orders for twice daily wound care using silver hydrofiber (Aquacel Ag+)   WOC Nurse wound consult note Reason for Consult: Wounds in the bilateral inguinal, subpannicular and inframammary areas, secondary to moisture and perhaps chronic condition eg., hydradenitis. Last seen by my partner, M. Austin in July of this year. Wound type:infectious vs moisture Pressure Injury POA: NA Measurement: length of abdominal pannus, bilateral inguinal, inframammary Wound bed: wet, pink Drainage (amount, consistency, odor) serous with odor Periwound:macerated Dressing procedure/placement/frequency: I will recommend twice daily washing of area with soap and water, drying completely and placement of absorbant wicking material.   Bilateral eye infection and treatment ordered by Dr. [place] Crane.   [place] nursing team will follow, and will remain available to this  patient, the nursing and medical teams.   Thanks, Maudie Flakes, MSN, RN, [place], Arther Abbott  Pager# [phone]

## 2018-06-01 NOTE — Progress Notes (Signed)
Pt sleeping with HR 107 following IV Lopressor. No distress.

## 2018-06-01 NOTE — Progress Notes (Signed)
Received patient from [address] [place], VS obtained, telemetry monitor applied, skin assessment completed and agree with previous documented skin issues, oriented to unit, call light placed in reach

## 2018-06-02 ENCOUNTER — Encounter (HOSPITAL_COMMUNITY): Payer: Self-pay | Admitting: Cardiology

## 2018-06-02 ENCOUNTER — Inpatient Hospital Stay (HOSPITAL_COMMUNITY): Payer: Medicaid Other

## 2018-06-02 DIAGNOSIS — Z9989 Dependence on other enabling machines and devices: Secondary | ICD-10-CM

## 2018-06-02 DIAGNOSIS — I2510 Atherosclerotic heart disease of native coronary artery without angina pectoris: Secondary | ICD-10-CM | POA: Diagnosis present

## 2018-06-02 DIAGNOSIS — I10 Essential (primary) hypertension: Secondary | ICD-10-CM

## 2018-06-02 DIAGNOSIS — I420 Dilated cardiomyopathy: Secondary | ICD-10-CM

## 2018-06-02 DIAGNOSIS — G4733 Obstructive sleep apnea (adult) (pediatric): Secondary | ICD-10-CM

## 2018-06-02 DIAGNOSIS — L03818 Cellulitis of other sites: Secondary | ICD-10-CM

## 2018-06-02 DIAGNOSIS — D509 Iron deficiency anemia, unspecified: Secondary | ICD-10-CM

## 2018-06-02 DIAGNOSIS — Z933 Colostomy status: Secondary | ICD-10-CM

## 2018-06-02 DIAGNOSIS — R000 Tachycardia, unspecified: Secondary | ICD-10-CM

## 2018-06-02 DIAGNOSIS — E662 Morbid (severe) obesity with alveolar hypoventilation: Secondary | ICD-10-CM

## 2018-06-02 LAB — BASIC METABOLIC PANEL
ANION GAP: 8 (ref 5–15)
BUN: 8 mg/dL (ref 6–20)
CALCIUM: 8.4 mg/dL — AB (ref 8.9–10.3)
CO2: 19 mmol/L — ABNORMAL LOW (ref 22–32)
CREATININE: 1.14 mg/dL — AB (ref 0.44–1.00)
Chloride: 115 mmol/L — ABNORMAL HIGH (ref 98–111)
GFR calc Af Amer: >60 mL/min (ref >60)
GLUCOSE: 100 mg/dL — AB (ref 70–99)
Potassium: 4 mmol/L (ref 3.5–5.1)
Sodium: 142 mmol/L (ref 135–145)

## 2018-06-02 LAB — CBC
HCT: 25.5 % — ABNORMAL LOW (ref 36.0–46.0)
Hemoglobin: 7.6 g/dL — ABNORMAL LOW (ref 12.0–15.0)
MCH: 21.5 pg — AB (ref 26.0–34.0)
MCHC: 29.8 g/dL — AB (ref 30.0–36.0)
MCV: 72 fL — AB (ref 78.0–100.0)
PLATELETS: 496 10*3/uL — AB (ref 150–400)
RBC: 3.54 MIL/uL — ABNORMAL LOW (ref 3.87–5.11)
RDW: 21.3 % — AB (ref 11.5–15.5)
WBC: 7.9 10*3/uL (ref 4.0–10.5)

## 2018-06-02 LAB — URINALYSIS, ROUTINE W REFLEX MICROSCOPIC
Bilirubin Urine: NEGATIVE
Glucose, UA: NEGATIVE mg/dL
KETONES UR: 5 mg/dL — AB
Nitrite: NEGATIVE
PH: 6 (ref 5.0–8.0)
PROTEIN: NEGATIVE mg/dL
Specific Gravity, Urine: 1.011 (ref 1.005–1.030)
WBC, UA: >50 WBC/hpf — ABNORMAL HIGH (ref 0–5)

## 2018-06-02 LAB — ECHOCARDIOGRAM LIMITED
HEIGHTINCHES: 62 in
Weight: 5749.6 oz

## 2018-06-02 LAB — PREPARE RBC (CROSSMATCH)

## 2018-06-02 MED ORDER — GERHARDT'S BUTT CREAM
TOPICAL_CREAM | Freq: Three times a day (TID) | CUTANEOUS | Status: DC
  Administered 2018-06-02 – 2018-06-03 (×3): via TOPICAL
  Filled 2018-06-02: qty 1

## 2018-06-02 MED ORDER — METOPROLOL TARTRATE 25 MG PO TABS
25.0000 mg | ORAL_TABLET | Freq: Two times a day (BID) | ORAL | Status: DC
  Administered 2018-06-02 – 2018-06-03 (×3): 25 mg via ORAL
  Filled 2018-06-02 (×3): qty 1

## 2018-06-02 MED ORDER — SODIUM CHLORIDE 0.9% IV SOLUTION
Freq: Once | INTRAVENOUS | Status: AC
  Administered 2018-06-02: 22:00:00 via INTRAVENOUS

## 2018-06-02 NOTE — Progress Notes (Signed)
Patient refused wound care to mid abd @ 1255 DT: pain @ wound sites from getting out of bed

## 2018-06-02 NOTE — Progress Notes (Signed)
Patient's wound care done by RN and NT and took approximately two hours to perform. Patient with very extensive wounds and they are very painful for patient. All wounds were washed clean, dried, and treated with Interdry per [facility] nurse recommendations.  Wounds extend to bil groin, pubic area, bil abd folds extending to back, buttocks and under Lt breast. Will continue to monitor patient.

## 2018-06-02 NOTE — Consult Note (Signed)
Follow up to yesterday's visit:  Patient with non-pressure related skin injuries to buttocks, posterior and medial thighs.  Have provided guidance for application of Gerhardt's butt cream (a compounded 1:1:1 product with zinc oxide/hydrocortisone/antifungal to be applied three times daily. Additionally, patient is provided with a bariatric bed with low air loss feature.  [place] nursing team will not follow, but will remain available to this patient, the nursing and medical teams.  Please re-consult if needed. Thanks, Maudie Flakes, MSN, RN, [place], Arther Abbott  Pager# [phone]

## 2018-06-02 NOTE — Progress Notes (Signed)
PROGRESS NOTE  Dominique [name] VZD:[id number] DOB: 04-Mar-1981 DOA: 05/30/2018 PCP: Benito Mccreedy, MD  HPI/Recap of past 24 hours: Dominique [name] is a 37 y.o. female with medical history significant for Crohn's s/p ostomy, asthma, HTN, OSA, morbid obesity, anal fistula presents to the ED complaining of several days of feeling dizzy, shortness of breath, subjective fever/chills. In the ED, patient was noted to have foul smelly multiple wounds located on her abdomen, groin area, with some drainage noted.  Reports a nurse comes in to help her take care of the wounds.  Ongoing for a couple of months, but has not seen any physician.   -05/31/18: Lot of pain due to cellulitis.  Bilateral eye infection also noted with purulence.  Spoke with ophthalmologist Dr. Jalene Mullet who recommended tobramycin 3-4 times a day until she follows up with him outpatient in his office. -06/01/2018: Pain is well controlled on IV Dilaudid.  Persistently tachycardic, appears to be in sinus, treated with beta-blocker.  Transfer to telemetry for monitoring of her heart rate. Cardiology consulted.  06/02/18: Patient seen and examined at her bedside.  Pain in her cellulitis lesions are improved with pain medications.  Started on metoprolol tartrate twice daily by cardiology.   Assessment/Plan: Principal Problem:   Cellulitis Active Problems:   Morbid obesity- BMI 67.5   Iron deficiency anemia   Crohn's colitis with perforation s/p left colectomy/colostomy 2016   OSA on CPAP   Obesity hypoventilation syndrome (HCC)   Colostomy in place [facility])   Multiple wounds of skin   Coronary artery disease  Diffused severe cellulitis of the abdomen, groin MRSA screening negative DC IV vancomycin.   Continue IV cefepime and IV Flagyl  Blood cultures x2 no growth to date Continue gentle IV fluid hydration Wound care following Continue IV Dilaudid 0.5 mg as needed every 4 hours for severe pain Continue OxyContin 10 mg twice  daily Continue bowel regimen to avoid opiates induced constipation  Persistent sinus tachycardia Responded well to beta-blockers Cardiology following Started on metoprolol titrate twice daily by cardiology 2D echo pending  Symptomatic iron deficiency anemia Continue ferrous sulfate Hemoglobin 7.6 and trending down 1 unit PRBCs ordered to be transfused Repeat CBC in the morning  CAP, stable Continue IV cefepime Continue breathing treatments O2 supplementation to maintain O2 saturation greater 92%  B/L eye infection, improving Discussed with Dr Posey Pronto  Recommendations for tobramycin drops 3-4 times daily until pt follows up in his clinic Continue eyedrops tobramycin every 6 hours  Crohn's disease CT abdomen showed postsurgical changes related to subtotal colectomy with new right upper quadrant enterostomy. No bowel inflammatory changes or obstruction Not on any current medication GI consulted and signed off  Hypertension Hypotension on presentation, improved with IV fluids  CKD stage III Stable at baseline  Asthma Stable Albuterol nebulizer scheduled, as needed  OSA/obesity hypoventilation syndrome BMI 65 Continue CPAP Weight loss outpatient  Morbid obesity BMI 65 Advised lifestyle modification    Code Status: Full code  Family Communication: None at bedside  Disposition Plan: To home in 1 to 2 days once cellulitis is improved   Consultants:  GI  Curbside with Dr. Jalene Mullet, ophthalmology  Procedures:  None  Antimicrobials:   IV cefepime IV Flagyl  DVT prophylaxis: Subcu Lovenox daily   Objective: Vitals:   06/02/18 0612 06/02/18 1101 06/02/18 1302 06/02/18 1432  BP: 126/69 (!) 120/49 (!) 147/65 121/71  Pulse: (!) 117 (!) 108 (!) 112 (!) 108  Resp: 18 20 19  Temp: 98.2 F (36.8 C) (!) 97.3 F (36.3 C) 99 F (37.2 C)   TempSrc:  Oral Oral   SpO2: 92% 96% 95%   Weight:      Height:        Intake/Output Summary (Last  24 hours) at 06/02/2018 1751 Last data filed at 06/02/2018 1506 Gross per 24 hour  Intake 2716.75 ml  Output 500 ml  Net 2216.75 ml   Filed Weights   05/30/18 0152  Weight: (!) 163 kg    Exam:  . General: 37 y.o. year-old female morbidly obese, in no acute distress.  Alert oriented x3.  Improved erythema and clear drainage noted from sclera bilaterally. . Cardiovascular: Regular rate and rhythm with no rubs or gallops.  No thyromegaly or JVD noted.   Marland Kitchen Respiratory: Clear to auscultation with no wheezes or rales. Good inspiratory effort. . Abdomen: Soft morbidly obese nondistended with normal bowel sounds x4 quadrants.  ostomy bag in place with small amount of stool present.  Diffused cellulitis affecting lower abdomen and groins. . Musculoskeletal: No lower extremity edema. 2/4 pulses in all 4 extremities. Marland Kitchen Psychiatry: Mood is appropriate for condition and setting   Data Reviewed: CBC: Recent Labs  Lab 05/30/18 0245 05/31/18 0507 06/01/18 0411 06/02/18 0406  WBC 11.7* 10.0 9.5 7.9  NEUTROABS 9.1*  --   --   --   HGB 8.6* 8.1* 7.7* 7.6*  HCT 28.9* 27.6* 26.1* 25.5*  MCV 71.0* 72.1* 72.9* 72.0*  PLT 517* 524* 528* 583*   Basic Metabolic Panel: Recent Labs  Lab 05/30/18 0245 05/31/18 0507 06/01/18 0411 06/02/18 0406  NA 140 141 140 142  K 3.7 3.7 3.4* 4.0  CL 113* 114* 115* 115*  CO2 18* 20* 20* 19*  GLUCOSE 92 92 103* 100*  BUN 14 12 10 8   CREATININE 1.09* 1.06* 1.04* 1.14*  CALCIUM 8.5* 8.2* 8.2* 8.4*   GFR: Estimated Creatinine Clearance: 101.7 mL/min (A) (by C-G formula based on SCr of 1.14 mg/dL (H)). Liver Function Tests: Recent Labs  Lab 05/30/18 0245  AST 14*  ALT 8  ALKPHOS 82  BILITOT 0.5  PROT 6.7  ALBUMIN 2.3*   Recent Labs  Lab 05/30/18 0245  LIPASE 24   No results for input(s): AMMONIA in the last 168 hours. Coagulation Profile: No results for input(s): INR, PROTIME in the last 168 hours. Cardiac Enzymes: No results for input(s):  CKTOTAL, CKMB, CKMBINDEX, TROPONINI in the last 168 hours. BNP (last 3 results) No results for input(s): PROBNP in the last 8760 hours. HbA1C: No results for input(s): HGBA1C in the last 72 hours. CBG: No results for input(s): GLUCAP in the last 168 hours. Lipid Profile: No results for input(s): CHOL, HDL, LDLCALC, TRIG, CHOLHDL, LDLDIRECT in the last 72 hours. Thyroid Function Tests: No results for input(s): TSH, T4TOTAL, FREET4, T3FREE, THYROIDAB in the last 72 hours. Anemia Panel: Recent Labs    05/30/18 1937  FERRITIN 15  TIBC 258  IRON 14*   Urine analysis:    Component Value Date/Time   COLORURINE YELLOW 06/02/2018 0222   APPEARANCEUR CLOUDY (A) 06/02/2018 0222   LABSPEC 1.011 06/02/2018 0222   PHURINE 6.0 06/02/2018 0222   GLUCOSEU NEGATIVE 06/02/2018 0222   HGBUR LARGE (A) 06/02/2018 0222   BILIRUBINUR NEGATIVE 06/02/2018 0222   KETONESUR 5 (A) 06/02/2018 0222   PROTEINUR NEGATIVE 06/02/2018 0222   UROBILINOGEN 0.2 07/22/2015 0405   NITRITE NEGATIVE 06/02/2018 0222   LEUKOCYTESUR LARGE (A) 06/02/2018 0222   Sepsis  Labs: @LABRCNTIP (procalcitonin:4,lacticidven:4)  ) Recent Results (from the past 240 hour(s))  Blood Culture (routine x 2)     Status: None (Preliminary result)   Collection Time: 05/30/18  2:45 AM  Result Value Ref Range Status   Specimen Description   Final    BLOOD RIGHT ANTECUBITAL Performed at [facility] [address], [place], [place] [address]    Special Requests   Final    BOTTLES DRAWN AEROBIC AND ANAEROBIC Blood Culture adequate volume Performed at [facility] [address], [place], [place] [address]    Culture   Final    NO GROWTH 3 DAYS Performed at [facility], [place] [address], [place], [place] [address]    Report Status PENDING  Incomplete  Blood Culture (routine x 2)     Status: None (Preliminary result)   Collection Time: 05/30/18  7:37 PM  Result Value Ref Range Status    Specimen Description BLOOD RIGHT HAND  Final   Special Requests   Final    BOTTLES DRAWN AEROBIC ONLY Blood Culture results may not be optimal due to an inadequate volume of blood received in culture bottles   Culture   Final    NO GROWTH 2 DAYS Performed at [facility], [place] [address] [place], [place] [address]    Report Status PENDING  Incomplete  MRSA PCR Screening     Status: None   Collection Time: 06/01/18  6:50 AM  Result Value Ref Range Status   MRSA by PCR NEGATIVE NEGATIVE Final    Comment:        The GeneXpert MRSA Assay (FDA approved for NASAL specimens only), is one component of a comprehensive MRSA colonization surveillance program. It is not intended to diagnose MRSA infection nor to guide or monitor treatment for MRSA infections. Performed at [facility], [place] [address], [place], [place] [address]       Studies: No results found.  Scheduled Meds: . enoxaparin (LOVENOX) injection  80 mg Subcutaneous Q24H  . ferrous sulfate  325 mg Oral Q breakfast  . Gerhardt's butt cream   Topical TID  . metoprolol tartrate  25 mg Oral BID  . sodium chloride flush  3 mL Intravenous Q12H  . tobramycin  2 drop Both Eyes Q6H    Continuous Infusions: . sodium chloride 75 mL/hr at 06/02/18 1047  . sodium chloride 250 mL (06/01/18 2203)  . ceFEPime (MAXIPIME) IV 2 g (06/02/18 1524)  . metronidazole 500 mg (06/02/18 1259)     LOS: 3 days     Kayleen Memos, MD [facility] Pager [phone]  If 7PM-7AM, please contact night-coverage [email or web address] Password [email or web address] 06/02/2018, 5:51 PM

## 2018-06-02 NOTE — Progress Notes (Signed)
Patient refuses gerdthart crweam at this time D Mateo Flow RN

## 2018-06-02 NOTE — Progress Notes (Signed)
  Echocardiogram 2D Echocardiogram has been performed.  Dominique [name] F 06/02/2018, 2:51 PM

## 2018-06-02 NOTE — Progress Notes (Signed)
Pharmacy Antibiotic Note  Dominique [name] is a 37 y.o. female with hx Crohn's disease with ostomy presented to the ED on 05/30/2018 with c/o dizziness, SOB and poor oral intake. CXR on 9/7 showed findings with suspicion for PNA.  She was also noted to have foul smelly wounds on her abomen and groin area.  She was started on vancomycin, cefepime and flagyl for PNA and wound infection.  Today, 06/02/2018: - day #3 abx - Afeb, wbc wnl - scr 1.14 (crcl~100) - all cultures have been negative thus far   Plan: - continue cefepime 2 gm IV q8h - continue flagyl 500 mg IV q8h  _____________________________________  Height: 5' 2"  (157.5 cm) Weight: (!) 359 lb 5.6 oz (163 kg) IBW/kg (Calculated) : 50.1  Temp (24hrs), Avg:98.3 F (36.8 C), Min:97.3 F (36.3 C), Max:99 F (37.2 C)  Recent Labs  Lab 05/30/18 0245 05/30/18 0355 05/31/18 0507 06/01/18 0411 06/02/18 0406  WBC 11.7*  --  10.0 9.5 7.9  CREATININE 1.09*  --  1.06* 1.04* 1.14*  LATICACIDVEN  --  1.03  --   --   --     Estimated Creatinine Clearance: 101.7 mL/min (A) (by C-G formula based on SCr of 1.14 mg/dL (H)).    Allergies  Allergen Reactions  . Other Shortness Of Breath and Swelling    Tree nuts  . Penicillins Other (See Comments)    Unknown childhood allergy Has patient had a PCN reaction causing immediate rash, facial/tongue/throat swelling, SOB or lightheadedness with hypotension: Unknown Has patient had a PCN reaction causing severe rash involving mucus membranes or skin necrosis: Unknown Has patient had a PCN reaction that required hospitalization: Unknown Has patient had a PCN reaction occurring within the last 10 years: Unknown If all of the above answers are "NO", then may proceed with Cephalosporin use.     Antimicrobials this admission:  9/7 ceftriaxone x1 9/7 vancomycin >> 9/10 9/7 cefepime >>  9/7 metronidazole >>   Dose adjustments this admission:  --  Microbiology results:  9/7 BCx x2:  9/9 MRSA  PCR: neg  Thank you for allowing pharmacy to be a part of this patient's care.  Lynelle Doctor 06/02/2018 1:35 PM

## 2018-06-02 NOTE — Consult Note (Addendum)
Cardiology Consultation:   Patient ID: Dominique [name] MRN: [id number]; DOB: 1981-06-13  Admit date: 05/30/2018 Date of Consult: 06/02/2018  Primary Care Provider: Benito Mccreedy, MD Primary Cardiologist: Ena Dawley, MD  Primary Electrophysiologist:  None    Patient Profile:   Dominique [name] is a 37 y.o. female with a hx of crohn's disease now with ostomy and associated with sepsis in 2016 and developed NICM with EF 20% though did improve to 55% in 1 month, HTN, OSA with Bi Pap, morbid obesity and fistula now admitted 05/30/18 with foul smelling wounds and hypotension who is being seen today for the evaluation of ST at 155 at the request of Dr. Nevada Crane.  History of Present Illness:   Dominique [name] with hx as above and return of EF to normal after acute sepsis with Crohn's disease issue in 2016.   She has recently been treated for candidal intertrigo, and fissured skin.  Now with open wounds that are foul smelling on admit.   Last echo 01/12/17 with normal EF.    After admit for treatment of wounds (cellulitis) extensive wounds and CAP she was found with HR 154-156 BP was stable and no chest pain.  Given IV lopressor and 1 L NS, and HR improved to the 90s.   Pt was stable but again yesterday pt was tachycardic 120-130s.  No chest pain.  IV lopressor given again with improvement in symptoms. .    EKG on Admit I personally reviewed with SR in 70s and mild T wave abnormalities but no acute changes.   Later day of admit at 2304 was in SVT at 154 with T wave inversion in V2 - appears ST, on EKG 05/31/18 with ST at 132 again with continued T wave inversion in V2.    Labs today Na 142, K+ 4.0, CL 115, C02 19, Cr 1.14 Ca 8.4   hgb on admit 8.6, WBC 11.7, plts 517.  Today hgb of 7.6.  MCV of 72, MCH 21 Iron 14, UIBC 244, TIBC 258, sat of 5, ferritin 15 CT abd :  Postsurgical changes related to subtotal colectomy with new right upper quadrant enterostomy. No bowel inflammatory changes  or obstruction.  Currently tele: I personally reviewed.: ST   She denies any chest pain or acute SOB at home.  No awareness of rapid HR.      Past Medical History:  Diagnosis Date  . Acute acalculous cholecystitis 11/16/2014  . Anal fistula   . Anemia   . Anxiety   . Asthma   . CHF (congestive heart failure) ([place])   . Complication of anesthesia    states she had a problem staying awake after her c-section, was transferred from [facility] to [facility]  . Coronary artery disease   . Diverticulitis of colon 11/17/2014  . Headache    used to have migraines  . Hepatic steatosis 11/17/2014  . Herpes   . Hypertension   . Morbid obesity ([place]) 03/22/2009   Qualifier: Diagnosis of  By: Ronnald Ramp CMA, [place]    . Neuropathy   . OSA (obstructive sleep apnea) 05/02/2014  . Seasonal allergies 06/13/2014  . Sickle cell anemia ([place])    She states she " has the trait" (07/20/15)  . Sleep apnea    uses Bipap  . Thyromegaly 05/02/2014    Past Surgical History:  Procedure Laterality Date  . c section 2011  2011  . COLONOSCOPY WITH PROPOFOL N/A 12/27/2015   Procedure: COLONOSCOPY WITH PROPOFOL;  Surgeon: Wonda Horner, MD;  Location: [facility] ENDOSCOPY;  Service: Endoscopy;  Laterality: N/A;  . COLOSTOMY N/A 06/11/2015   Procedure:  CREATION OF COLOSTOMY;  Surgeon: Georganna Skeans, MD;  Location: [place];  Service: General;  Laterality: N/A;  . ESOPHAGOGASTRODUODENOSCOPY N/A 07/19/2015   Procedure: ESOPHAGOGASTRODUODENOSCOPY (EGD);  Surgeon: Clarene Essex, MD;  Location: [facility] ENDOSCOPY;  Service: Endoscopy;  Laterality: N/A;  . FLEXIBLE SIGMOIDOSCOPY N/A 06/05/2015   Procedure: FLEXIBLE SIGMOIDOSCOPY;  Surgeon: Ronald Lobo, MD;  Location: [facility] ENDOSCOPY;  Service: Endoscopy;  Laterality: N/A;  . LAPAROTOMY N/A 06/09/2015   Procedure: EXPLORATORY LAPAROTOMY, DRAINAGE OF INTRAABDOMINAL ABSCESS, PARTIAL COLON RESECTION,  APPLICATION OF WOUND VAC;  Surgeon: Georganna Skeans, MD;  Location: [place];  Service: General;  Laterality:  N/A;  . LAPAROTOMY N/A 06/11/2015   Procedure: RE-EXPLORATION OF ABDOMEN;  Surgeon: Georganna Skeans, MD;  Location: [place];  Service: General;  Laterality: N/A;     Home Medications:  Prior to Admission medications   Medication Sig Start Date End Date Taking? Authorizing Provider  acetaminophen (TYLENOL) 500 MG tablet Take 1,000-2,000 mg by mouth every 6 (six) hours as needed for mild pain.    Yes [provider]  ibuprofen (ADVIL,MOTRIN) 200 MG tablet Take 800 mg by mouth every 6 (six) hours as needed for moderate pain.   Yes [provider]  tiZANidine (ZANAFLEX) 4 MG tablet Take 4 mg by mouth every 8 (eight) hours as needed for muscle spasms.  05/14/18  Yes [provider]  cephALEXin (KEFLEX) 500 MG capsule Take 1 capsule (500 mg total) by mouth 3 (three) times daily. Patient not taking: Reported on 05/30/2018 05/01/18   Jola Schmidt, MD  DULoxetine (CYMBALTA) 20 MG capsule Take 1 capsule (20 mg total) by mouth 2 (two) times daily. Patient not taking: Reported on 05/30/2018 04/23/18 04/23/19  Roxan Hockey, MD  ferrous sulfate 325 (65 FE) MG tablet 1 tablet twice a day with meals Patient not taking: Reported on 05/30/2018 04/23/18   Roxan Hockey, MD  fluconazole (DIFLUCAN) 200 MG tablet Take 1 tablet (200 mg total) by mouth daily. Patient not taking: Reported on 05/30/2018 04/23/18   Roxan Hockey, MD  Ganciclovir (ZIRGAN) 0.15 % GEL Place 1 drop into both eyes 5 (five) times daily. Patient not taking: Reported on 05/30/2018 04/23/18   Roxan Hockey, MD  HYDROcodone-acetaminophen (NORCO/VICODIN) 5-325 MG tablet Take 1 tablet by mouth every 4 (four) hours as needed for moderate pain. Patient not taking: Reported on 05/30/2018 05/01/18   Jola Schmidt, MD  neomycin-polymyxin b-dexamethasone (MAXITROL) 3.5-10000-0.1 SUSP Place 1 drop into both eyes 5 (five) times daily. Patient not taking: Reported on 05/30/2018 04/23/18   Roxan Hockey, MD  ondansetron (ZOFRAN ODT) 8 MG  disintegrating tablet Take 1 tablet (8 mg total) by mouth every 8 (eight) hours as needed for nausea or vomiting. Patient not taking: Reported on 05/30/2018 05/01/18   Jola Schmidt, MD  oxyCODONE-acetaminophen (PERCOCET/ROXICET) 5-325 MG tablet Take 1 tablet by mouth every 6 (six) hours as needed for severe pain. Patient not taking: Reported on 05/30/2018 04/23/18   Roxan Hockey, MD  phenazopyridine (PYRIDIUM) 200 MG tablet Take 1 tablet (200 mg total) by mouth 3 (three) times daily. Patient not taking: Reported on 05/30/2018 05/01/18   Jola Schmidt, MD  [facility] HFA 108 [phone] Base) MCG/ACT inhaler Inhale 2 puffs into the lungs 4 (four) times daily as needed for wheezing or shortness of breath. Patient not taking: Reported on 05/30/2018 04/23/18   Roxan Hockey, MD    Inpatient Medications: Scheduled  Meds: . enoxaparin (LOVENOX) injection  80 mg Subcutaneous Q24H  . ferrous sulfate  325 mg Oral Q breakfast  . Gerhardt's butt cream   Topical TID  . ipratropium-albuterol  3 mL Nebulization BID  . sodium chloride flush  3 mL Intravenous Q12H  . tobramycin  2 drop Both Eyes Q6H   Continuous Infusions: . sodium chloride 75 mL/hr at 06/01/18 1911  . sodium chloride 250 mL (06/01/18 2203)  . ceFEPime (MAXIPIME) IV 2 g (06/02/18 0550)  . metronidazole 500 mg (06/02/18 0650)   PRN Meds: sodium chloride, acetaminophen **OR** acetaminophen, albuterol, HYDROcodone-acetaminophen, HYDROmorphone (DILAUDID) injection, iopamidol, labetalol, ondansetron **OR** ondansetron (ZOFRAN) IV, polyethylene glycol, sodium chloride flush, tiZANidine  Allergies:    Allergies  Allergen Reactions  . Other Shortness Of Breath and Swelling    Tree nuts  . Penicillins Other (See Comments)    Unknown childhood allergy Has patient had a PCN reaction causing immediate rash, facial/tongue/throat swelling, SOB or lightheadedness with hypotension: Unknown Has patient had a PCN reaction causing severe rash involving mucus membranes  or skin necrosis: Unknown Has patient had a PCN reaction that required hospitalization: Unknown Has patient had a PCN reaction occurring within the last 10 years: Unknown If all of the above answers are "NO", then may proceed with Cephalosporin use.     Social History:   Social History   Socioeconomic History  . Marital status: Single    Spouse name: Not on file  . Number of children: Not on file  . Years of education: Not on file  . Highest education level: Not on file  Occupational History  . Not on file  Social Needs  . Financial resource strain: Not on file  . Food insecurity:    Worry: Not on file    Inability: Not on file  . Transportation needs:    Medical: Not on file    Non-medical: Not on file  Tobacco Use  . Smoking status: Current Some Day Smoker    Packs/day: 0.50    Years: 4.00    [name] years: 2.00    Types: Cigarettes    Start date: 07/24/2014    Last attempt to quit: 05/27/2015    Years since quitting: 3.0  . Smokeless tobacco: Never Used  Substance and Sexual Activity  . Alcohol use: No    Alcohol/week: 0.0 standard drinks  . Drug use: No  . Sexual activity: Not on file  Lifestyle  . Physical activity:    Days per week: Not on file    Minutes per session: Not on file  . Stress: Not on file  Relationships  . Social connections:    Talks on phone: Not on file    Gets together: Not on file    Attends religious service: Not on file    Active member of club or organization: Not on file    Attends meetings of clubs or organizations: Not on file    Relationship status: Not on file  . Intimate partner violence:    Fear of current or ex partner: Not on file    Emotionally abused: Not on file    Physically abused: Not on file    Forced sexual activity: Not on file  Other Topics Concern  . Not on file  Social History Narrative  . Not on file    Family History:    Family History  Problem Relation Age of Onset  . Hypertension Mother   . Allergies  Mother   .  Hypertension Father   . Cancer Paternal Grandmother        lung  . Hypertension Sister   . Asthma Brother   . Diabetes Maternal Grandmother   . Hypertension Maternal Grandmother   . Hypertension Maternal Grandfather   . Emphysema Paternal Grandfather      ROS:  Please see the history of present illness.  General:no colds or fevers, no weight changes Skin:no rashes or ulcers HEENT:no blurred vision, no congestion CV:see HPI PUL:see HPI GI:no diarrhea + ileostomy, no constipation or melena, no indigestion GU:no hematuria, no dysuria MS:no joint pain, no claudication, has difficulty walking at times due to morbid obesity. Neuro:no syncope, no lightheadedness Endo:no diabetes, no thyroid disease  All other ROS reviewed and negative.     Physical Exam/Data:   Vitals:   06/01/18 1238 06/01/18 2025 06/01/18 2132 06/02/18 0612  BP: 138/67  (!) 145/66 126/69  Pulse: (!) 106  (!) 119 (!) 117  Resp: 14  20 18   Temp: 98.4 F (36.9 C)  98.7 F (37.1 C) 98.2 F (36.8 C)  TempSrc: Oral     SpO2: 94% 95% 97% 92%  Weight:      Height:        Intake/Output Summary (Last 24 hours) at 06/02/2018 0816 Last data filed at 06/02/2018 0700 Gross per 24 hour  Intake 707.52 ml  Output 500 ml  Net 207.52 ml   Filed Weights   05/30/18 0152  Weight: (!) 163 kg   Body mass index is 65.73 kg/m.  General:  Well nourished- morbidly obese, well developed, in no acute distress, lying flat  HEENT: normal Lymph: no adenopathy Neck: no JVD Endocrine:  No thryomegaly Vascular: No carotid bruits; pedal pulses 2+ bilaterally  Cardiac:  normal S1, S2; RRR; no murmur gallup rub or click Lungs:  clear to auscultation bilaterally, ant. Though muffled no wheezing, rhonchi or rales  Abd: obese soft, nontender, no hepatomegaly + ulcers and ileostomy site. Ext: no edema Musculoskeletal:  No deformities, BUE and BLE strength normal and equal Skin: warm and dry  Neuro: alert and oriented X  3 MAE, follows commands, no focal abnormalities noted Psych:  Normal affect   Relevant CV Studies: Echo 01/12/17 Result Narrative   Left ventricular hypertrophy - mild  Normal left ventricular systolic function, ejection fraction > 55%  Normal right ventricular systolic function     Laboratory Data:  Chemistry Recent Labs  Lab 05/31/18 0507 06/01/18 0411 06/02/18 0406  NA 141 140 142  K 3.7 3.4* 4.0  CL 114* 115* 115*  CO2 20* 20* 19*  GLUCOSE 92 103* 100*  BUN 12 10 8   CREATININE 1.06* 1.04* 1.14*  CALCIUM 8.2* 8.2* 8.4*  GFRNONAA >60 >60 >60  GFRAA >60 >60 >60  ANIONGAP 7 5 8     Recent Labs  Lab 05/30/18 0245  PROT 6.7  ALBUMIN 2.3*  AST 14*  ALT 8  ALKPHOS 82  BILITOT 0.5   Hematology Recent Labs  Lab 05/31/18 0507 06/01/18 0411 06/02/18 0406  WBC 10.0 9.5 7.9  RBC 3.83* 3.58* 3.54*  HGB 8.1* 7.7* 7.6*  HCT 27.6* 26.1* 25.5*  MCV 72.1* 72.9* 72.0*  MCH 21.1* 21.5* 21.5*  MCHC 29.3* 29.5* 29.8*  RDW 21.3* 21.1* 21.3*  PLT 524* 528* 496*   Cardiac EnzymesNo results for input(s): TROPONINI in the last 168 hours. No results for input(s): TROPIPOC in the last 168 hours.  BNPNo results for input(s): BNP, PROBNP in the last 168 hours.  DDimer No results for input(s): DDIMER in the last 168 hours.  Radiology/Studies:  Dg Chest 2 View  Result Date: 05/30/2018 CLINICAL DATA:  Fever EXAM: CHEST - 2 VIEW COMPARISON:  None. FINDINGS: Retrocardiac opacity suspicious for left lower lobe pneumonia. Heart size is top normal and stable. Nonaneurysmal thoracic aorta. No acute osseous abnormality. IMPRESSION: Retrocardiac opacity suspicious for left lower lobe pneumonia. Electronically Signed   By: Ashley Royalty M.D.   On: 05/30/2018 03:27   Ct Abdomen Pelvis W Contrast  Result Date: 05/30/2018 CLINICAL DATA:  Abdominal discomfort with fever and chills. Nausea. Poor oral intake over the past few days. History of Crohn's disease. EXAM: CT ABDOMEN AND PELVIS WITH  CONTRAST TECHNIQUE: Multidetector CT imaging of the abdomen and pelvis was performed using the standard protocol following bolus administration of intravenous contrast. CONTRAST:  134m ISOVUE-300 IOPAMIDOL (ISOVUE-300) INJECTION 61% COMPARISON:  CT abdomen pelvis dated January 06, 2017. FINDINGS: Lower chest: No acute abnormality. Hepatobiliary: No focal liver abnormality is seen. No gallstones, gallbladder wall thickening, or biliary dilatation. Pancreas: Unremarkable. No pancreatic ductal dilatation or surrounding inflammatory changes. Spleen: Normal in size without focal abnormality. Adrenals/Urinary Tract: Adrenal glands are unremarkable. Kidneys are normal, without renal calculi, focal lesion, or hydronephrosis. Bladder is unremarkable. Stomach/Bowel: The stomach is within normal limits. Postsurgical changes related to interval right hemicolectomy. Prior partial left-sided colectomy. The residual sigmoid colon and rectum are unchanged. New right upper quadrant enterostomy. Several small bowel loops are adhered to the anterior abdominal wall, consistent with adhesions. No bowel wall thickening, distention, or surrounding inflammatory changes. Vascular/Lymphatic: No significant vascular findings are present. No enlarged abdominal or pelvic lymph nodes. Reproductive: Uterus and bilateral adnexa are unremarkable. Other: No free fluid or pneumoperitoneum. Small fat containing parastomal hernia. Musculoskeletal: No acute or significant osseous findings. IMPRESSION: 1. Postsurgical changes related to subtotal colectomy with new right upper quadrant enterostomy. No bowel inflammatory changes or obstruction. 2. Small fat-containing parastomal hernia. Electronically Signed   By: WTitus DubinM.D.   On: 05/30/2018 10:11    Assessment and Plan:   1. ST to 155 at times.  Improves with BB, possibly due to fluid loss with wounds - no JVD, no edema currently.  Check echo she is unaware that this occurs, concern for CM  if occurring at home freq.  Dr. NMeda Coffeeto see 2. Extensive wounds followed by IM 3. Ostomy due to Crohn's disease followed by GI and IM   4. Anemia with hx Sickle cell or sickle cell trait down to 7.6   For questions or updates, please contact CSebastopolPlease consult [email or web address] for contact info under   Signed, LCecilie Kicks NP  06/02/2018 8:16 AM   The patient was seen, examined and discussed with LCecilie Kicks NP and I agree with the above.   This is a new patient to cardiology. TRheannon Cerneyis a 37y.o. female with a hx of morbid obesity, OSA/hypoventilation syndrome, crohn's disease now with ostomy and associated with sepsis in 2016 and developed NICM with EF 20% though did improve to 55% in 1 month with CHF therapy, HTN, OSA with Bi Pap, morbid obesity and fistula now, recently treated for candidal intertrigo, and fissured skin, now admitted on 05/30/18 with severe groin cellulitis, CAP. Cardiology is consulted for persistent sinus tachycardia, HRs 105-120.   On physical exam she appears comfortable, she is lying flat in bed, she is morbidly obese, she has SQ1-J9mild systolic murmur 2 out of 6, lung seems to be  clear to auscultation, JVD unable to assess secondary to her size.  There is no lower extremity edema. Her creatinine is normal saw her electrolytes, she has significant microcytic anemia with hemoglobin 7.6 and MCV of 72.  Echocardiogram is pending.  Assessment and plan  Persistent sinus tachycardia in the settings of anemia, and acute illness, community-acquired pneumonia and severe groin cellulitis.  I would continue treating underlying infections aggressively.  She also needs to be worked up for microcytic anemia.  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  I will start metoprolol 25 mg p.o. twice daily and uptitrate as needed.  We will obtain an echocardiogram to  reevaluate her LVEF.  I anticipate that with therapy of her anemia and infections her heart rate will decrease and eventually her metoprolol will need to be decreased as well.  Ena Dawley, MD 06/02/2018

## 2018-06-03 LAB — CREATININE, SERUM
CREATININE: 1.04 mg/dL — AB (ref 0.44–1.00)
GFR calc Af Amer: >60 mL/min (ref >60)
GFR calc non Af Amer: >60 mL/min (ref >60)

## 2018-06-03 LAB — BPAM RBC
BLOOD PRODUCT EXPIRATION DATE: 201910092359
ISSUE DATE / TIME: 201909102258
UNIT TYPE AND RH: 5100

## 2018-06-03 LAB — TYPE AND SCREEN
ABO/RH(D): O POS
Antibody Screen: NEGATIVE
Unit division: 0

## 2018-06-03 LAB — CBC
HCT: 27.6 % — ABNORMAL LOW (ref 36.0–46.0)
Hemoglobin: 8.2 g/dL — ABNORMAL LOW (ref 12.0–15.0)
MCH: 22.2 pg — ABNORMAL LOW (ref 26.0–34.0)
MCHC: 29.7 g/dL — ABNORMAL LOW (ref 30.0–36.0)
MCV: 74.8 fL — AB (ref 78.0–100.0)
Platelets: 481 10*3/uL — ABNORMAL HIGH (ref 150–400)
RBC: 3.69 MIL/uL — ABNORMAL LOW (ref 3.87–5.11)
RDW: 21.1 % — AB (ref 11.5–15.5)
WBC: 8.4 10*3/uL (ref 4.0–10.5)

## 2018-06-03 MED ORDER — TOBRAMYCIN 0.3 % OP SOLN: 2.0000 [drp] | Freq: Four times a day (QID) | OPHTHALMIC | 0 refills | Status: DC

## 2018-06-03 MED ORDER — CEPHALEXIN 500 MG PO CAPS: 500.0000 mg | ORAL_CAPSULE | Freq: Four times a day (QID) | ORAL | Status: DC

## 2018-06-03 MED ORDER — FERROUS SULFATE 325 (65 FE) MG PO TABS: 325.0000 mg | ORAL_TABLET | Freq: Every day | ORAL | 0 refills | Status: DC

## 2018-06-03 MED ORDER — GERHARDT'S BUTT CREAM: 1.0000 "application " | TOPICAL_CREAM | Freq: Three times a day (TID) | CUTANEOUS | 0 refills | Status: DC

## 2018-06-03 MED ORDER — CEPHALEXIN 500 MG PO CAPS: 500.0000 mg | ORAL_CAPSULE | Freq: Four times a day (QID) | ORAL | 0 refills | Status: AC

## 2018-06-03 MED ORDER — METOPROLOL TARTRATE 25 MG PO TABS: 25.0000 mg | ORAL_TABLET | Freq: Two times a day (BID) | ORAL | 0 refills | Status: DC

## 2018-06-03 MED ORDER — HYDROCODONE-ACETAMINOPHEN 5-325 MG PO TABS: 1.0000 | ORAL_TABLET | Freq: Three times a day (TID) | ORAL | 0 refills | Status: DC | PRN

## 2018-06-03 MED ORDER — CEPHALEXIN 500 MG PO CAPS
500.0000 mg | ORAL_CAPSULE | Freq: Two times a day (BID) | ORAL | Status: DC
  Administered 2018-06-03: 500 mg via ORAL
  Filled 2018-06-03: qty 1

## 2018-06-03 MED ORDER — ALBUTEROL SULFATE (2.5 MG/3ML) 0.083% IN NEBU: 2.5000 mg | INHALATION_SOLUTION | Freq: Two times a day (BID) | RESPIRATORY_TRACT | 12 refills | Status: AC | PRN

## 2018-06-03 MED ORDER — POLYETHYLENE GLYCOL 3350 17 G PO PACK: 17.0000 g | PACK | Freq: Every day | ORAL | 0 refills | Status: DC | PRN

## 2018-06-03 NOTE — Progress Notes (Signed)
Pt discharged to home, [facility] voucher given to patient.  Instructions reviewed with patient acknowledged understanding. SRP, RN

## 2018-06-03 NOTE — Progress Notes (Signed)
CSW informed by patient's RN that patient needs assistance with transportation.  CSW followed up with patient at bedside and discussed transportation needs. Patient reported that she does not have any family/friends that can provide transportation home. Patient reported that she is unable to afford a taxi and cannot ride the bus. Patient reported that she is able to get in and out taxi without assistance.   CSW followed up with patient's RN who confirmed that patient is unable to ride the bus. CSW provided taxi voucher to patient's RN.   CSW signing off, no other needs identified at this time.  Abundio Miu, [place] Social Worker [facility] Cell#: [phone]

## 2018-06-03 NOTE — Evaluation (Signed)
Physical Therapy One Time Evaluation Patient Details Name: Dominique [name] MRN: [id number] DOB: [birth year] Today's Date: 06/03/2018   History of Present Illness  37 y.o. female with medical history significant for Crohn's s/p colostomy, asthma, HTN, OSA, morbid obesity, anal fistula presents to the ED complaining of several days of feeling dizzy, shortness of breath, subjective fever/chills. In the ED, patient was noted to have foul smelly multiple wounds located on her abdomen, groin area, with some drainage noted.   Pt admitted for diffused severe cellulitis of the abdomen, groin  Clinical Impression  Patient evaluated by Physical Therapy with no further acute PT needs identified. All education has been completed and the patient has no further questions. Pt ambulated to bathroom and then in hallway.  Pt mobilizing well without assistive device and anticipates d/c home soon. See below for any follow-up Physical Therapy or equipment needs. PT is signing off. Thank you for this referral.     Follow Up Recommendations No PT follow up    Equipment Recommendations  None recommended by PT    Recommendations for Other Services       Precautions / Restrictions Precautions Precautions: None      Mobility  Bed Mobility Overal bed mobility: Needs Assistance Bed Mobility: Supine to Sit;Sit to Supine     Supine to sit: Supervision Sit to supine: Min assist   General bed mobility comments: supervision for safety from air mattress bed, assist for LEs onto bed   Transfers Overall transfer level: Needs assistance Equipment used: None Transfers: Sit to/from Stand Sit to Stand: Supervision            Ambulation/Gait Ambulation/Gait assistance: Supervision;Min guard Gait Distance (Feet): 80 Feet Assistive device: None Gait Pattern/deviations: Step-through pattern;Wide base of support;Decreased stride length     General Gait Details: increased bilateral trunk sway due to body habitus,  no LOB observed  Stairs            Wheelchair Mobility    Modified Rankin (Stroke Patients Only)       Balance                                             Pertinent Vitals/Pain Pain Assessment: Faces Faces Pain Scale: Hurts even more Pain Location: abdomen, groin area Pain Descriptors / Indicators: Sore;Tender Pain Intervention(s): Patient requesting pain meds-RN notified;Monitored during session;Limited activity within patient's tolerance;Repositioned    Home Living Family/patient expects to be discharged to:: Private residence Living Arrangements: Parent Available Help at Discharge: Family;Available 24 hours/day Type of Home: Apartment Home Access: Stairs to enter Entrance Stairs-Rails: Can reach both Entrance Stairs-Number of Steps: 4 Home Layout: One level Home Equipment: None      Prior Function Level of Independence: Independent               Hand Dominance        Extremity/Trunk Assessment        Lower Extremity Assessment Lower Extremity Assessment: Overall WFL for tasks assessed(motion limited by body habitus)       Communication   Communication: No difficulties  Cognition Arousal/Alertness: Awake/alert Behavior During Therapy: WFL for tasks assessed/performed Overall Cognitive Status: Within Functional Limits for tasks assessed  General Comments      Exercises     Assessment/Plan    PT Assessment Patent does not need any further PT services  PT Problem List         PT Treatment Interventions      PT Goals (Current goals can be found in the Care Plan section)  Acute Rehab PT Goals PT Goal Formulation: All assessment and education complete, DC therapy    Frequency     Barriers to discharge        Co-evaluation               AM-PAC PT "6 Clicks" Daily Activity  Outcome Measure Difficulty turning over in bed (including adjusting  bedclothes, sheets and blankets)?: None Difficulty moving from lying on back to sitting on the side of the bed? : None Difficulty sitting down on and standing up from a chair with arms (e.g., wheelchair, bedside commode, etc,.)?: A Little Help needed moving to and from a bed to chair (including a wheelchair)?: A Little Help needed walking in hospital room?: A Little Help needed climbing 3-5 steps with a railing? : A Little 6 Click Score: 20    End of Session   Activity Tolerance: Patient tolerated treatment well Patient left: in bed;with call bell/phone within reach Nurse Communication: Mobility status;Patient requests pain meds PT Visit Diagnosis: Difficulty in walking, not elsewhere classified (R26.2)    Time: 7867-5449 PT Time Calculation (min) (ACUTE ONLY): 28 min   Charges:   PT Evaluation $PT Eval Low Complexity: Effort, PT, DPT Acute Rehabilitation Services Office: [phone] Pager: [phone]   Trena Platt 06/03/2018, 12:41 PM

## 2018-06-03 NOTE — Discharge Summary (Signed)
Discharge Summary  Dominique [name] DUK:[id number] DOB: 31-Dec-1980  PCP: Benito Mccreedy, MD  Admit date: 05/30/2018 Discharge date: 06/03/2018  Time spent: 25 minutes  Recommendations for Outpatient Follow-up:  1. Follow-up with your GI physician at [facility]  2. follow-up with your PCP 3. Follow-up with cardiology 4. Continue wound care at home 5. Home health RN for complex wound care  Discharge Diagnoses:  Active Hospital Problems   Diagnosis Date Noted  . Cellulitis 05/30/2018  . Coronary artery disease   . Multiple wounds of skin 04/19/2018  . 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 Crohn's colitis with perforation s/p left colectomy/colostomy 2016   . OSA on CPAP   . Obesity hypoventilation syndrome ([place])   . Iron deficiency anemia 04/07/2015  . Morbid obesity- BMI 67.5 03/22/2009    Resolved Hospital Problems  No resolved problems to display.    Discharge Condition: Stable  Diet recommendation: Resume previous diet  Vitals:   06/03/18 0513 06/03/18 1347  BP: 131/72 133/79  Pulse: 96 88  Resp: (!) 2 16  Temp: 98.8 F (37.1 C) 98.2 F (36.8 C)  SpO2: 95% 95%    History of present illness:  Dominique Mooreis a 37 y.o.femalewith medical history significant forCrohn's s/p ostomy, asthma, HTN, OSA, morbid obesity, anal fistula presentsto the ED complaining of several days of feeling dizzy, shortness of breath,subjective fever/chills. In the ED, patient was noted to have foul smelly multiple wounds located on her abdomen, groin area, with some drainage noted.  Reports a nurse comes in to help her take care of the wounds. Ongoing for a couple of months,but has not seen any physician. -05/31/18: Lot of pain due to cellulitis.  Bilateral eye infection also noted with purulence.  Spoke with ophthalmologist Dr. Jalene Mullet who recommended tobramycin 3-4 times a day until she follows up with him outpatient in his office. -06/01/2018: Pain is well controlled on IV Dilaudid.   Persistently tachycardic, appears to be in sinus, treated with beta-blocker.  Transfer to telemetry for monitoring of her heart rate. Cardiology consulted. -06/02/18: Started on metoprolol tartrate twice daily by cardiology.  06/03/2018: Patient seen and examined at bedside.  She has no new complaints.  No acute events overnight.  On the day of discharge, the patient was hemodynamically stable.  She will need to follow-up with her PCP, cardiology, continue with wound care at home.    Hospital Course:  Principal Problem:   Cellulitis Active Problems:   Morbid obesity- BMI 67.5   Iron deficiency anemia   Crohn's colitis with perforation s/p left colectomy/colostomy 2016   OSA on CPAP   Obesity hypoventilation syndrome (HCC)   Colostomy in place [facility])   Multiple wounds of skin   Coronary artery disease  Improving diffused severe cellulitis of the abdomen, groin MRSA screening negative DC IV vancomycin.   Completed course of IV cefepime and IV Flagyl  Started Keflex 500 mg every 6 hours x5 days Blood cultures x2 no growth to date Continue wound care at home  Resolved sinus tachycardia Responded well to beta-blockers Cardiology following Continue metoprolol titrate twice daily by cardiology Follow-up with cardiology outpatient  Symptomatic iron deficiency anemia Continue ferrous sulfate Hemoglobin 8.5 from 7.6 1 unit PRBCs transfused Follow-up with your PCP  CAP, stable Completed IV cefepime and IV Flagyl Continue breathing treatments  B/L eye infection, improving Discussed with Dr Posey Pronto  Recommendations for tobramycin drops 3-4 times daily until pt follows up in his clinic Continue eyedrops tobramycin every 6 hours  Follow-up with Dr. Posey Pronto outpatient posthospitalization Call for an appointment on 06/03/2018  Crohn's disease CT abdomen showedpostsurgical changes related to subtotal colectomy with new right upper quadrant enterostomy. No bowel inflammatory changes or  obstruction Not on any current medication GI consulted and signed off Follow-up with your GI provider  Hypertension Blood pressure is stable  CKD stage III Stable at baseline  Asthma Stable Albuterol nebulizer scheduled, as needed  OSA/obesity hypoventilation syndrome BMI 65 Continue CPAP Weight loss outpatient  Morbid obesity BMI 65 Advised lifestyle modification    Code Status: Full code   Consultants:  GI  Curbside with Dr. Jalene Mullet, ophthalmology  Procedures:  None  Antimicrobials:   IV cefepime IV Flagyl  Keflex at discharge   Discharge Exam: BP 133/79 (BP Location: Left Wrist)   Pulse 88   Temp 98.2 F (36.8 C) (Oral)   Resp 16   Ht 5' 2"  (1.575 m)   Wt (!) 163 kg   LMP 05/29/2018 Comment: pregnancy waiver form signed 05-30-2018  SpO2 95%   BMI 65.73 kg/m  . General: 37 y.o. year-old femaleold female well developed well nourished in no acute distress.  Alert and oriented x3.  Improved erythema noted from sclera bilaterally after starting eyedrops tobramycin. . Cardiovascular: Regular rate and rhythm with no rubs or gallops.  No thyromegaly or JVD noted.   Marland Kitchen Respiratory: Clear to auscultation with no wheezes or rales. Good inspiratory effort. . Abdomen: Soft nontender nondistended with normal bowel sounds x4 quadrants.  Ostomy bag in place with small amount of stools present.  Diffuse cellulitis affecting lower abdomen and groins. . Musculoskeletal: No lower extremity edema. 2/4 pulses in all 4 extremities. Marland Kitchen Psychiatry: Mood is appropriate for condition and setting  Discharge Instructions You were cared for by a hospitalist during your hospital stay. If you have any questions about your discharge medications or the care you received while you were in the hospital after you are discharged, you can call the unit and asked to speak with the hospitalist on call if the hospitalist that took care of you is not available. Once you are  discharged, your primary care physician will handle any further medical issues. Please note that NO REFILLS for any discharge medications will be authorized once you are discharged, as it is imperative that you return to your primary care physician (or establish a relationship with a primary care physician if you do not have one) for your aftercare needs so that they can reassess your need for medications and monitor your lab values.  Discharge Instructions    DME Nebulizer machine   Complete by:  As directed    Patient needs a nebulizer to treat with the following condition:  COPD (chronic obstructive pulmonary disease) ([place])     Allergies as of 06/03/2018      Reactions   Other Shortness Of Breath, Swelling   Tree nuts   Penicillins Other (See Comments)   Unknown childhood allergy Has patient had a PCN reaction causing immediate rash, facial/tongue/throat swelling, SOB or lightheadedness with hypotension: Unknown Has patient had a PCN reaction causing severe rash involving mucus membranes or skin necrosis: Unknown Has patient had a PCN reaction that required hospitalization: Unknown Has patient had a PCN reaction occurring within the last 10 years: Unknown If all of the above answers are "NO", then may proceed with Cephalosporin use.      Medication List    STOP taking these medications   acetaminophen 500 MG tablet Commonly known  as:  TYLENOL   DULoxetine 20 MG capsule Commonly known as:  CYMBALTA   fluconazole 200 MG tablet Commonly known as:  DIFLUCAN   Ganciclovir 0.15 % Gel Commonly known as:  ZIRGAN   ibuprofen 200 MG tablet Commonly known as:  ADVIL,MOTRIN   neomycin-polymyxin b-dexamethasone 3.5-10000-0.1 Susp Commonly known as:  MAXITROL   ondansetron 8 MG disintegrating tablet Commonly known as:  ZOFRAN-ODT   oxyCODONE-acetaminophen 5-325 MG tablet Commonly known as:  PERCOCET/ROXICET   phenazopyridine 200 MG tablet Commonly known as:  PYRIDIUM   PROAIR  HFA 108 (90 Base) MCG/ACT inhaler Generic drug:  albuterol Replaced by:  albuterol (2.5 MG/3ML) 0.083% nebulizer solution   tiZANidine 4 MG tablet Commonly known as:  ZANAFLEX     TAKE these medications   albuterol (2.5 MG/3ML) 0.083% nebulizer solution Commonly known as:  PROVENTIL Take 3 mLs (2.5 mg total) by nebulization 2 (two) times daily as needed for wheezing. Replaces:  PROAIR HFA 108 (90 Base) MCG/ACT inhaler   cephALEXin 500 MG capsule Commonly known as:  KEFLEX Take 1 capsule (500 mg total) by mouth every 6 (six) hours for 5 days. What changed:  when to take this   ferrous sulfate 325 (65 FE) MG tablet Take 1 tablet (325 mg total) by mouth daily with breakfast. Start taking on:  06/04/2018 What changed:    how much to take  how to take this  when to take this  additional instructions   Gerhardt's butt cream Crea Apply 1 application topically 3 (three) times daily.   HYDROcodone-acetaminophen 5-325 MG tablet Commonly known as:  NORCO/VICODIN Take 1 tablet by mouth every 8 (eight) hours as needed for severe pain. What changed:    when to take this  reasons to take this   metoprolol tartrate 25 MG tablet Commonly known as:  LOPRESSOR Take 1 tablet (25 mg total) by mouth 2 (two) times daily.   polyethylene glycol packet Commonly known as:  MIRALAX / GLYCOLAX Take 17 g by mouth daily as needed for mild constipation.   tobramycin 0.3 % ophthalmic solution Commonly known as:  TOBREX Place 2 drops into both eyes every 6 (six) hours.            Durable Medical Equipment  (From admission, onward)         Start     Ordered   06/03/18 0000  DME Nebulizer machine    Question:  Patient needs a nebulizer to treat with the following condition  Answer:  COPD (chronic obstructive pulmonary disease) ([place])   06/03/18 1334         Allergies  Allergen Reactions  . Other Shortness Of Breath and Swelling    Tree nuts  . Penicillins Other (See Comments)      Unknown childhood allergy Has patient had a PCN reaction causing immediate rash, facial/tongue/throat swelling, SOB or lightheadedness with hypotension: Unknown Has patient had a PCN reaction causing severe rash involving mucus membranes or skin necrosis: Unknown Has patient had a PCN reaction that required hospitalization: Unknown Has patient had a PCN reaction occurring within the last 10 years: Unknown If all of the above answers are "NO", then may proceed with Cephalosporin use.    Follow-up Information    Osei-Bonsu, Iona Beard, MD. Call in 1 day(s).   Specialty:  Internal Medicine Why:  Please call for an appointment. Contact information: [place] [address] [phone]        Dorothy Spark, MD. Call  in 1 day(s).   Specialty:  Cardiology Why:  Please call for an appointment. Contact information: [place] [place] [address] [phone]        Jalene Mullet, MD. Call in 1 day(s).   Specialty:  Ophthalmology Why:  Please call for an appointment within a week. Contact information: [address] [place] [address] [phone]            The results of significant diagnostics from this hospitalization (including imaging, microbiology, ancillary and laboratory) are listed below for reference.    Significant Diagnostic Studies: Dg Chest 2 View  Result Date: 05/30/2018 CLINICAL DATA:  Fever EXAM: CHEST - 2 VIEW COMPARISON:  None. FINDINGS: Retrocardiac opacity suspicious for left lower lobe pneumonia. Heart size is top normal and stable. Nonaneurysmal thoracic aorta. No acute osseous abnormality. IMPRESSION: Retrocardiac opacity suspicious for left lower lobe pneumonia. Electronically Signed   By: Ashley Royalty M.D.   On: 05/30/2018 03:27   Ct Abdomen Pelvis W Contrast  Result Date: 05/30/2018 CLINICAL DATA:  Abdominal discomfort with fever and chills. Nausea. Poor oral intake over the past few days.  History of Crohn's disease. EXAM: CT ABDOMEN AND PELVIS WITH CONTRAST TECHNIQUE: Multidetector CT imaging of the abdomen and pelvis was performed using the standard protocol following bolus administration of intravenous contrast. CONTRAST:  128m ISOVUE-300 IOPAMIDOL (ISOVUE-300) INJECTION 61% COMPARISON:  CT abdomen pelvis dated January 06, 2017. FINDINGS: Lower chest: No acute abnormality. Hepatobiliary: No focal liver abnormality is seen. No gallstones, gallbladder wall thickening, or biliary dilatation. Pancreas: Unremarkable. No pancreatic ductal dilatation or surrounding inflammatory changes. Spleen: Normal in size without focal abnormality. Adrenals/Urinary Tract: Adrenal glands are unremarkable. Kidneys are normal, without renal calculi, focal lesion, or hydronephrosis. Bladder is unremarkable. Stomach/Bowel: The stomach is within normal limits. Postsurgical changes related to interval right hemicolectomy. Prior partial left-sided colectomy. The residual sigmoid colon and rectum are unchanged. New right upper quadrant enterostomy. Several small bowel loops are adhered to the anterior abdominal wall, consistent with adhesions. No bowel wall thickening, distention, or surrounding inflammatory changes. Vascular/Lymphatic: No significant vascular findings are present. No enlarged abdominal or pelvic lymph nodes. Reproductive: Uterus and bilateral adnexa are unremarkable. Other: No free fluid or pneumoperitoneum. Small fat containing parastomal hernia. Musculoskeletal: No acute or significant osseous findings. IMPRESSION: 1. Postsurgical changes related to subtotal colectomy with new right upper quadrant enterostomy. No bowel inflammatory changes or obstruction. 2. Small fat-containing parastomal hernia. Electronically Signed   By: WTitus DubinM.D.   On: 05/30/2018 10:11    Microbiology: Recent Results (from the past 240 hour(s))  Blood Culture (routine x 2)     Status: None (Preliminary result)    Collection Time: 05/30/18  2:45 AM  Result Value Ref Range Status   Specimen Description   Final    BLOOD RIGHT ANTECUBITAL Performed at [facility] [place] [address]   Special Requests   Final    BOTTLES DRAWN AEROBIC AND ANAEROBIC Blood Culture adequate volume Performed at [facility] [place] [id number]   Culture   Final    NO GROWTH 4 DAYS Performed at [facility] [address] [place]  [id number]   Report Status PENDING  Incomplete  Blood Culture (routine x 2)     Status: None (Preliminary result)   Collection Time: 05/30/18  7:37 PM  Result Value Ref Range Status  Specimen Description BLOOD RIGHT HAND  Final   Special Requests   Final    BOTTLES DRAWN AEROBIC ONLY Blood Culture results may not be optimal due to an inadequate volume of blood received in culture bottles   Culture   Final    NO GROWTH 3 DAYS Performed at [facility], [place] [address], [place], [place] [address]    Report Status PENDING  Incomplete  MRSA PCR Screening     Status: None   Collection Time: 06/01/18  6:50 AM  Result Value Ref Range Status   MRSA by PCR NEGATIVE NEGATIVE Final    Comment:        The GeneXpert MRSA Assay (FDA approved for NASAL specimens only), is one component of a comprehensive MRSA colonization surveillance program. It is not intended to diagnose MRSA infection nor to guide or monitor treatment for MRSA infections. Performed at [facility], [place] [address], [place], [place] [address]      Labs: Basic Metabolic Panel: Recent Labs  Lab 05/30/18 0245 05/31/18 0507 06/01/18 0411 06/02/18 0406 06/03/18 0427  NA 140 141 140 142  --   K 3.7 3.7 3.4* 4.0  --   CL 113* 114* 115* 115*  --   CO2 18* 20* 20* 19*  --   GLUCOSE 92 92 103* 100*  --   BUN 14 12 10 8   --   CREATININE 1.09* 1.06* 1.04* 1.14* 1.04*  CALCIUM 8.5* 8.2* 8.2* 8.4*   --    Liver Function Tests: Recent Labs  Lab 05/30/18 0245  AST 14*  ALT 8  ALKPHOS 82  BILITOT 0.5  PROT 6.7  ALBUMIN 2.3*   Recent Labs  Lab 05/30/18 0245  LIPASE 24   No results for input(s): AMMONIA in the last 168 hours. CBC: Recent Labs  Lab 05/30/18 0245 05/31/18 0507 06/01/18 0411 06/02/18 0406 06/03/18 0427  WBC 11.7* 10.0 9.5 7.9 8.4  NEUTROABS 9.1*  --   --   --   --   HGB 8.6* 8.1* 7.7* 7.6* 8.2*  HCT 28.9* 27.6* 26.1* 25.5* 27.6*  MCV 71.0* 72.1* 72.9* 72.0* 74.8*  PLT 517* 524* 528* 496* 481*   Cardiac Enzymes: No results for input(s): CKTOTAL, CKMB, CKMBINDEX, TROPONINI in the last 168 hours. BNP: BNP (last 3 results) No results for input(s): BNP in the last 8760 hours.  ProBNP (last 3 results) No results for input(s): PROBNP in the last 8760 hours.  CBG: No results for input(s): GLUCAP in the last 168 hours.     Signed:  Kayleen Memos, MD [facility] 06/03/2018, 2:23 PM

## 2018-06-03 NOTE — Discharge Instructions (Signed)

## 2018-06-03 NOTE — Consult Note (Addendum)
[place] Nurse wound follow up Wound type: intertriginous, partial thickness, surgical full  thickness (surgery in 2016) Measurement: midline 13cm x 3cm x .0.5cm 100% pink, non granulating. Moderate amt yellow exudate, no odor noted.There are three linear wounds coming off the sides of the original midline wound. They are in abd folds. Abdominal folds have three linear wounds, left upper has 8cm linear wound, left lower abd has 12cm linear wound and right lower abd has 15cm linear wound. All are pink, moderate tan exudate, slightly malodorous  Bilateral groins each has partial thickness wounds as well as the subpannicular area and left inframammary and left flank area in skin fold. All are pink, moist.  Perineal area has more than a dozen open areas questionably caused by hydradenitis.(per previous WOC note) These are open to air. Buttocks and skin folds reportedly have wounds that we are treating with Dr. Glennis Brink Cream. Pt is going to get up to get dressed for discharge and will allow care of those wounds at that time.  Wound bed:see above Drainage (amount, consistency, odor) see above Periwound: moist, intact Dressing procedure/placement/frequency: Continue with current orders for abd wound. Patient already using InterDry Ag+ in other areas. Will write more specific orders for application of InterDry Ag+. InterDry Ag+ must be used in a single layer after cleansing and drying wounds. Not doubled and folded. Instructions given to pt for home use.  If pt is not discharged today [place] team will continue to follow.  [place] Nurse ostomy follow up Stoma type/location: RUQ ileostomy This is not new to patient. She has supplies at bedside as well as [employer] supplies at home. Pt states she does not need assistance with this. HHRN is ordered for wound care. [place] team will follow if she is not discharged today.   Fara Olden, RN-C, WTA-C, OCA Wound Treatment Associate Ostomy Care Associate   For wounds  in skin folds subpannicular, bilateral groins, left inframammary and back/flank skin folds:  Cleanse with soap and water, pat dry, apply SINGLE LAYER of InterDry Ag+. DO NOT FOLD or double over in skin folds. Lift skin fold, place edge of InterDry up to fold crease and lay skin fold back down. InterDry can not wick away moisture if it is folded up under the skin folds. Perform BID.

## 2018-06-03 NOTE — Care Management Note (Addendum)
Case Management Note  Patient Details  Name: Rudene Poulsen MRN: [id number] Date of Birth: 1981/01/24  Subjective/Objective:   Admitted with Cellulitis.                 Action/Plan: Plan to discharge home with [facility], referral given to in house rep. CSW for transportation.   Expected Discharge Date:                  Expected Discharge Plan:  [place]  In-House Referral:     Discharge planning Services  CM Consult  Post Acute Care Choice:    Choice offered to:     DME Arranged:    DME Agency:     HH Arranged:  RN, PT, Nurse's Aide [place] Agency:  [facility]  Status of Service:  Completed, signed off  If discussed at [place] of Stay Meetings, dates discussed:    Additional CommentsPurcell Mouton, RN 06/03/2018, 10:30 AM

## 2018-06-04 LAB — CULTURE, BLOOD (ROUTINE X 2)
CULTURE: NO GROWTH
Special Requests: ADEQUATE

## 2018-06-05 LAB — CULTURE, BLOOD (ROUTINE X 2): Culture: NO GROWTH

## 2018-07-04 ENCOUNTER — Encounter (HOSPITAL_COMMUNITY): Payer: Self-pay | Admitting: Emergency Medicine

## 2018-07-04 ENCOUNTER — Inpatient Hospital Stay (HOSPITAL_COMMUNITY)
Admission: EM | Admit: 2018-07-04 | Discharge: 2018-08-24 | DRG: 640 | Disposition: A | Discharge status: Other Acute Inpatient Hospital | Payer: Medicaid Other | Attending: Family Medicine | Admitting: Family Medicine

## 2018-07-04 ENCOUNTER — Other Ambulatory Visit: Payer: Self-pay

## 2018-07-04 DIAGNOSIS — I110 Hypertensive heart disease with heart failure: Secondary | ICD-10-CM | POA: Diagnosis present

## 2018-07-04 DIAGNOSIS — G629 Polyneuropathy, unspecified: Secondary | ICD-10-CM | POA: Diagnosis present

## 2018-07-04 DIAGNOSIS — R52 Pain, unspecified: Secondary | ICD-10-CM

## 2018-07-04 DIAGNOSIS — E440 Moderate protein-calorie malnutrition: Secondary | ICD-10-CM

## 2018-07-04 DIAGNOSIS — E861 Hypovolemia: Secondary | ICD-10-CM | POA: Diagnosis not present

## 2018-07-04 DIAGNOSIS — L304 Erythema intertrigo: Secondary | ICD-10-CM | POA: Diagnosis present

## 2018-07-04 DIAGNOSIS — Z880 Allergy status to penicillin: Secondary | ICD-10-CM

## 2018-07-04 DIAGNOSIS — Z825 Family history of asthma and other chronic lower respiratory diseases: Secondary | ICD-10-CM

## 2018-07-04 DIAGNOSIS — K50118 Crohn's disease of large intestine with other complication: Secondary | ICD-10-CM | POA: Diagnosis not present

## 2018-07-04 DIAGNOSIS — B359 Dermatophytosis, unspecified: Secondary | ICD-10-CM | POA: Diagnosis present

## 2018-07-04 DIAGNOSIS — A419 Sepsis, unspecified organism: Secondary | ICD-10-CM | POA: Diagnosis not present

## 2018-07-04 DIAGNOSIS — H179 Unspecified corneal scar and opacity: Secondary | ICD-10-CM | POA: Diagnosis present

## 2018-07-04 DIAGNOSIS — H169 Unspecified keratitis: Secondary | ICD-10-CM | POA: Diagnosis present

## 2018-07-04 DIAGNOSIS — G4730 Sleep apnea, unspecified: Secondary | ICD-10-CM | POA: Diagnosis present

## 2018-07-04 DIAGNOSIS — E871 Hypo-osmolality and hyponatremia: Secondary | ICD-10-CM | POA: Diagnosis not present

## 2018-07-04 DIAGNOSIS — N289 Disorder of kidney and ureter, unspecified: Secondary | ICD-10-CM

## 2018-07-04 DIAGNOSIS — I2510 Atherosclerotic heart disease of native coronary artery without angina pectoris: Secondary | ICD-10-CM | POA: Diagnosis present

## 2018-07-04 DIAGNOSIS — E860 Dehydration: Secondary | ICD-10-CM

## 2018-07-04 DIAGNOSIS — I951 Orthostatic hypotension: Secondary | ICD-10-CM

## 2018-07-04 DIAGNOSIS — L03311 Cellulitis of abdominal wall: Secondary | ICD-10-CM | POA: Diagnosis present

## 2018-07-04 DIAGNOSIS — Z8249 Family history of ischemic heart disease and other diseases of the circulatory system: Secondary | ICD-10-CM

## 2018-07-04 DIAGNOSIS — D649 Anemia, unspecified: Secondary | ICD-10-CM | POA: Diagnosis present

## 2018-07-04 DIAGNOSIS — E870 Hyperosmolality and hypernatremia: Secondary | ICD-10-CM | POA: Diagnosis not present

## 2018-07-04 DIAGNOSIS — J45909 Unspecified asthma, uncomplicated: Secondary | ICD-10-CM | POA: Diagnosis present

## 2018-07-04 DIAGNOSIS — K760 Fatty (change of) liver, not elsewhere classified: Secondary | ICD-10-CM | POA: Diagnosis present

## 2018-07-04 DIAGNOSIS — Z6841 Body Mass Index (BMI) 40.0 and over, adult: Secondary | ICD-10-CM

## 2018-07-04 DIAGNOSIS — Z91018 Allergy to other foods: Secondary | ICD-10-CM

## 2018-07-04 DIAGNOSIS — F17210 Nicotine dependence, cigarettes, uncomplicated: Secondary | ICD-10-CM | POA: Diagnosis present

## 2018-07-04 DIAGNOSIS — T148XXA Other injury of unspecified body region, initial encounter: Secondary | ICD-10-CM | POA: Diagnosis present

## 2018-07-04 DIAGNOSIS — K5010 Crohn's disease of large intestine without complications: Secondary | ICD-10-CM | POA: Diagnosis present

## 2018-07-04 DIAGNOSIS — H547 Unspecified visual loss: Secondary | ICD-10-CM | POA: Diagnosis present

## 2018-07-04 DIAGNOSIS — E872 Acidosis: Secondary | ICD-10-CM | POA: Diagnosis present

## 2018-07-04 DIAGNOSIS — D638 Anemia in other chronic diseases classified elsewhere: Secondary | ICD-10-CM | POA: Diagnosis present

## 2018-07-04 DIAGNOSIS — Z9049 Acquired absence of other specified parts of digestive tract: Secondary | ICD-10-CM

## 2018-07-04 DIAGNOSIS — M170 Bilateral primary osteoarthritis of knee: Secondary | ICD-10-CM | POA: Diagnosis present

## 2018-07-04 DIAGNOSIS — N179 Acute kidney failure, unspecified: Secondary | ICD-10-CM | POA: Diagnosis present

## 2018-07-04 DIAGNOSIS — D571 Sickle-cell disease without crisis: Secondary | ICD-10-CM | POA: Diagnosis present

## 2018-07-04 DIAGNOSIS — H16002 Unspecified corneal ulcer, left eye: Secondary | ICD-10-CM | POA: Diagnosis present

## 2018-07-04 DIAGNOSIS — I509 Heart failure, unspecified: Secondary | ICD-10-CM | POA: Diagnosis present

## 2018-07-04 DIAGNOSIS — R238 Other skin changes: Secondary | ICD-10-CM

## 2018-07-04 DIAGNOSIS — Z933 Colostomy status: Secondary | ICD-10-CM

## 2018-07-04 DIAGNOSIS — D509 Iron deficiency anemia, unspecified: Secondary | ICD-10-CM | POA: Diagnosis present

## 2018-07-04 DIAGNOSIS — Z9989 Dependence on other enabling machines and devices: Secondary | ICD-10-CM

## 2018-07-04 DIAGNOSIS — H18603 Keratoconus, unspecified, bilateral: Secondary | ICD-10-CM | POA: Diagnosis present

## 2018-07-04 DIAGNOSIS — G4733 Obstructive sleep apnea (adult) (pediatric): Secondary | ICD-10-CM

## 2018-07-04 DIAGNOSIS — D62 Acute posthemorrhagic anemia: Secondary | ICD-10-CM | POA: Diagnosis present

## 2018-07-04 LAB — I-STAT BETA HCG BLOOD, ED (MC, WL, AP ONLY)
I-stat hCG, quantitative: <5 m[IU]/mL (ref <5)
I-stat hCG, quantitative: <5 m[IU]/mL (ref <5)

## 2018-07-04 LAB — COMPREHENSIVE METABOLIC PANEL
ALT: 11 U/L (ref 0–44)
AST: 22 U/L (ref 15–41)
Albumin: 2.4 g/dL — ABNORMAL LOW (ref 3.5–5.0)
Alkaline Phosphatase: 128 U/L — ABNORMAL HIGH (ref 38–126)
Anion gap: 10 (ref 5–15)
BUN: 14 mg/dL (ref 6–20)
CO2: 16 mmol/L — ABNORMAL LOW (ref 22–32)
CREATININE: 1.16 mg/dL — AB (ref 0.44–1.00)
Calcium: 8.4 mg/dL — ABNORMAL LOW (ref 8.9–10.3)
Chloride: 107 mmol/L (ref 98–111)
GFR calc Af Amer: >60 mL/min (ref >60)
GFR, EST NON AFRICAN AMERICAN: 59 mL/min — AB (ref >60)
Glucose, Bld: 83 mg/dL (ref 70–99)
POTASSIUM: 4.2 mmol/L (ref 3.5–5.1)
Sodium: 133 mmol/L — ABNORMAL LOW (ref 135–145)
TOTAL PROTEIN: 7.3 g/dL (ref 6.5–8.1)
Total Bilirubin: 0.6 mg/dL (ref 0.3–1.2)

## 2018-07-04 LAB — CBC WITH DIFFERENTIAL/PLATELET
Abs Immature Granulocytes: 0.1 10*3/uL — ABNORMAL HIGH (ref 0.00–0.07)
BASOS PCT: 0 %
Basophils Absolute: 0 10*3/uL (ref 0.0–0.1)
EOS PCT: 0 %
Eosinophils Absolute: 0 10*3/uL (ref 0.0–0.5)
HCT: 32.6 % — ABNORMAL LOW (ref 36.0–46.0)
Hemoglobin: 9.4 g/dL — ABNORMAL LOW (ref 12.0–15.0)
Immature Granulocytes: 1 %
Lymphocytes Relative: 11 %
Lymphs Abs: 1.1 10*3/uL (ref 0.7–4.0)
MCH: 22.4 pg — AB (ref 26.0–34.0)
MCHC: 28.8 g/dL — ABNORMAL LOW (ref 30.0–36.0)
MCV: 77.8 fL — AB (ref 80.0–100.0)
MONO ABS: 1.1 10*3/uL — AB (ref 0.1–1.0)
Monocytes Relative: 12 %
NEUTROS ABS: 7.4 10*3/uL (ref 1.7–7.7)
Neutrophils Relative %: 76 %
PLATELETS: 448 10*3/uL — AB (ref 150–400)
RBC: 4.19 MIL/uL (ref 3.87–5.11)
RDW: 22.8 % — AB (ref 11.5–15.5)
WBC: 9.7 10*3/uL (ref 4.0–10.5)
nRBC: 0.2 % (ref 0.0–0.2)

## 2018-07-04 LAB — I-STAT CG4 LACTIC ACID, ED: Lactic Acid, Venous: 1.08 mmol/L (ref 0.5–1.9)

## 2018-07-04 MED ORDER — TRAMADOL HCL 50 MG PO TABS
50.0000 mg | ORAL_TABLET | Freq: Four times a day (QID) | ORAL | Status: DC | PRN
  Administered 2018-07-05 – 2018-07-08 (×6): 50 mg via ORAL
  Filled 2018-07-04 (×6): qty 1

## 2018-07-04 MED ORDER — SENNOSIDES-DOCUSATE SODIUM 8.6-50 MG PO TABS: 1.0000 | ORAL_TABLET | Freq: Every evening | ORAL | Status: DC | PRN

## 2018-07-04 MED ORDER — ALBUTEROL SULFATE (2.5 MG/3ML) 0.083% IN NEBU
2.5000 mg | INHALATION_SOLUTION | Freq: Four times a day (QID) | RESPIRATORY_TRACT | Status: DC | PRN
  Administered 2018-08-22 – 2018-08-23 (×2): 2.5 mg via RESPIRATORY_TRACT
  Filled 2018-07-04 (×2): qty 3

## 2018-07-04 MED ORDER — ACETAMINOPHEN 325 MG PO TABS
650.0000 mg | ORAL_TABLET | Freq: Four times a day (QID) | ORAL | Status: DC | PRN
  Administered 2018-07-05 – 2018-08-05 (×42): 650 mg via ORAL
  Filled 2018-07-04 (×43): qty 2

## 2018-07-04 MED ORDER — ONDANSETRON HCL 4 MG PO TABS
4.0000 mg | ORAL_TABLET | Freq: Four times a day (QID) | ORAL | Status: DC | PRN
  Administered 2018-07-28: 4 mg via ORAL
  Filled 2018-07-04: qty 1

## 2018-07-04 MED ORDER — HYDROMORPHONE HCL 1 MG/ML IJ SOLN
1.0000 mg | Freq: Once | INTRAMUSCULAR | Status: AC
  Administered 2018-07-04: 1 mg via INTRAVENOUS
  Filled 2018-07-04: qty 1

## 2018-07-04 MED ORDER — ONDANSETRON HCL 4 MG/2ML IJ SOLN
4.0000 mg | Freq: Four times a day (QID) | INTRAMUSCULAR | Status: DC | PRN
  Administered 2018-07-05 – 2018-08-18 (×8): 4 mg via INTRAVENOUS
  Filled 2018-07-04 (×9): qty 2

## 2018-07-04 MED ORDER — ACETAMINOPHEN 650 MG RE SUPP: 650.0000 mg | Freq: Four times a day (QID) | RECTAL | Status: DC | PRN

## 2018-07-04 MED ORDER — METOPROLOL TARTRATE 25 MG PO TABS
25.0000 mg | ORAL_TABLET | Freq: Two times a day (BID) | ORAL | Status: DC
  Administered 2018-07-05 – 2018-07-07 (×6): 25 mg via ORAL
  Filled 2018-07-04 (×6): qty 1

## 2018-07-04 MED ORDER — TIZANIDINE HCL 4 MG PO TABS
4.0000 mg | ORAL_TABLET | Freq: Three times a day (TID) | ORAL | Status: DC | PRN
  Administered 2018-07-05 – 2018-08-09 (×14): 4 mg via ORAL
  Filled 2018-07-04 (×15): qty 1

## 2018-07-04 MED ORDER — SODIUM CHLORIDE 0.9 % IV SOLN
INTRAVENOUS | Status: AC
  Administered 2018-07-05 (×2): via INTRAVENOUS

## 2018-07-04 MED ORDER — KETOROLAC TROMETHAMINE 15 MG/ML IJ SOLN
15.0000 mg | Freq: Four times a day (QID) | INTRAMUSCULAR | Status: DC | PRN
  Administered 2018-07-05: 15 mg via INTRAVENOUS
  Filled 2018-07-04 (×2): qty 1

## 2018-07-04 MED ORDER — SODIUM CHLORIDE 0.9% FLUSH
3.0000 mL | Freq: Two times a day (BID) | INTRAVENOUS | Status: DC
  Administered 2018-07-05 – 2018-07-30 (×29): 3 mL via INTRAVENOUS

## 2018-07-04 MED ORDER — BISACODYL 5 MG PO TBEC: 5.0000 mg | DELAYED_RELEASE_TABLET | Freq: Every day | ORAL | Status: DC | PRN

## 2018-07-04 MED ORDER — SODIUM CHLORIDE 0.45 % IV BOLUS
2000.0000 mL | Freq: Once | INTRAVENOUS | Status: AC
  Administered 2018-07-04: 2000 mL via INTRAVENOUS

## 2018-07-04 MED ORDER — METOCLOPRAMIDE HCL 5 MG/ML IJ SOLN
10.0000 mg | Freq: Once | INTRAMUSCULAR | Status: AC
  Administered 2018-07-04: 10 mg via INTRAVENOUS
  Filled 2018-07-04: qty 2

## 2018-07-04 NOTE — H&P (Signed)
History and Physical    Dominique [name] MEQ:[id number] DOB: Mar 22, 1981 DOA: 07/04/2018  PCP: Benito Mccreedy, MD   Patient coming from: Home   Chief Complaint: Lightheaded, recent vomiting, poor appetite   HPI: Dominique [name] is a 37 y.o. 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  The patient reports that she been in her usual state of health until she developed nausea with recurrent vomiting several days ago.  This seemed to resolved, she is not having any abdominal pain, flank pain, dysuria, fevers, or chills, but she continues to have poor appetite and has become increasingly lightheaded, particularly on sitting up or standing.  She denies passing out and denies any chest pain.  She denies any significant dyspnea or cough.  Reports that her chronic wounds seem to be stable and the groin wounds may be improving some.  She does not take her iron supplements.  She has home health for wound care.  ED Course: Upon arrival to the ED, patient is found to be afebrile, saturating well on room air, tachycardic to 140, and with stable blood pressure.  EKG features a sinus tachycardia with rate 131.  Chemistry panel is notable for sodium of 132, bicarbonate 16, and creatinine 1.16, similar to priors.  CBC features a chronic microcytic anemia with hemoglobin 9.4, improved from priors.  There is a chronic thrombocytosis noted with platelets 448,000.  Lactic acid is reassuringly normal.  Patient was given Dilaudid, Reglan, blood cultures were collected, and she was fluid resuscitated with 2 L normal saline.  Blood pressure remained stable, tachycardia has improved, and the patient will be observed for ongoing  evaluation and management of lightheadedness and orthostasis with marked sinus tachycardia suspected secondary to hypovolemia after recent vomiting.  Review of Systems:  All other systems reviewed and apart from HPI, are negative.  Past Medical History:  Diagnosis Date  . Acute acalculous cholecystitis 11/16/2014  . Anal fistula   . Anemia   . Anxiety   . Asthma   . CHF (congestive heart failure) ([place])   . Complication of anesthesia    states she had a problem staying awake after her c-section, was [facility]  . Coronary artery disease   . Diverticulitis of colon 11/17/2014  . Headache    used to have migraines  . Hepatic steatosis 11/17/2014  . Herpes   . Hypertension   . Morbid obesity ([place]) 03/22/2009   Qualifier: Diagnosis of  By: Ronnald Ramp CMA, Chemira    . Neuropathy   . OSA (obstructive sleep apnea) 05/02/2014  . Seasonal allergies 06/13/2014  . Sickle cell anemia ([place])    She states she " has the trait" (07/20/15)  . Sleep apnea    uses Bipap  . Thyromegaly 05/02/2014    Past Surgical History:  Procedure Laterality Date  . c section 2011  2011  . COLONOSCOPY WITH PROPOFOL N/A 12/27/2015   Procedure: COLONOSCOPY WITH PROPOFOL;  Surgeon: Wonda Horner, MD;  Location: [facility] ENDOSCOPY;  Service: Endoscopy;  Laterality: N/A;  . COLOSTOMY N/A 06/11/2015   Procedure:  CREATION OF COLOSTOMY;  Surgeon: Georganna Skeans, MD;  Location: [place];  Service: General;  Laterality: N/A;  . ESOPHAGOGASTRODUODENOSCOPY N/A 07/19/2015   Procedure: ESOPHAGOGASTRODUODENOSCOPY (EGD);  Surgeon: Altamese Dilling  Magod, MD;  Location: [place] ENDOSCOPY;  Service: Endoscopy;  Laterality: N/A;  . FLEXIBLE SIGMOIDOSCOPY N/A 06/05/2015   Procedure: FLEXIBLE SIGMOIDOSCOPY;  Surgeon: Ronald Lobo, MD;  Location: [facility] ENDOSCOPY;  Service: Endoscopy;  Laterality: N/A;  . LAPAROTOMY N/A 06/09/2015   Procedure: EXPLORATORY LAPAROTOMY, DRAINAGE OF INTRAABDOMINAL ABSCESS, PARTIAL COLON RESECTION,  APPLICATION OF WOUND  VAC;  Surgeon: Georganna Skeans, MD;  Location: [place];  Service: General;  Laterality: N/A;  . LAPAROTOMY N/A 06/11/2015   Procedure: RE-EXPLORATION OF ABDOMEN;  Surgeon: Georganna Skeans, MD;  Location: [place];  Service: General;  Laterality: N/A;     reports that she has been smoking cigarettes. She started smoking about 3 years ago. She has a 2.00 pack-year smoking history. She has never used smokeless tobacco. She reports that she does not drink alcohol or use drugs.  Allergies  Allergen Reactions  . Other Shortness Of Breath and Swelling    Tree nuts  . Penicillins Other (See Comments)    Unknown childhood allergy Has patient had a PCN reaction causing immediate rash, facial/tongue/throat swelling, SOB or lightheadedness with hypotension: Unknown Has patient had a PCN reaction causing severe rash involving mucus membranes or skin necrosis: Unknown Has patient had a PCN reaction that required hospitalization: Unknown Has patient had a PCN reaction occurring within the last 10 years: Unknown If all of the above answers are "NO", then may proceed with Cephalosporin use.     Family History  Problem Relation Age of Onset  . Hypertension Mother   . Allergies Mother   . Hypertension Father   . Cancer Paternal Grandmother        lung  . Hypertension Sister   . Asthma Brother   . Diabetes Maternal Grandmother   . Hypertension Maternal Grandmother   . Hypertension Maternal Grandfather   . Emphysema Paternal Grandfather      Prior to Admission medications   Medication Sig Start Date End Date Taking? Authorizing Provider  acetaminophen (TYLENOL) 500 MG tablet Take 500 mg by mouth every 6 (six) hours as needed for mild pain, moderate pain or headache.   Yes [provider]  tiZANidine (ZANAFLEX) 4 MG tablet Take 4 mg by mouth every 8 (eight) hours as needed for muscle spasms.  06/14/18  Yes [provider]  albuterol (PROVENTIL) (2.5 MG/3ML) 0.083% nebulizer solution Take  3 mLs (2.5 mg total) by nebulization 2 (two) times daily as needed for wheezing. Patient not taking: Reported on 07/04/2018 06/03/18   Kayleen Memos, DO  ferrous sulfate 325 (65 FE) MG tablet Take 1 tablet (325 mg total) by mouth daily with breakfast. Patient not taking: Reported on 07/04/2018 06/04/18   Kayleen Memos, DO  Hydrocortisone (GERHARDT'S BUTT CREAM) CREA Apply 1 application topically 3 (three) times daily. Patient not taking: Reported on 07/04/2018 06/03/18   Kayleen Memos, DO  metoprolol tartrate (LOPRESSOR) 25 MG tablet Take 1 tablet (25 mg total) by mouth 2 (two) times daily. Patient not taking: Reported on 07/04/2018 06/03/18   Kayleen Memos, DO  polyethylene glycol [facility] / GLYCOLAX) packet Take 17 g by mouth daily as needed for mild constipation. Patient not taking: Reported on 07/04/2018 06/03/18   Kayleen Memos, DO  tobramycin (TOBREX) 0.3 % ophthalmic solution Place 2 drops into both eyes every 6 (six) hours. Patient not taking: Reported on 07/04/2018 06/03/18   Kayleen Memos, DO    Physical Exam: Vitals:   07/04/18 1954 07/04/18 2000 07/04/18 2142 07/04/18 2230  BP: 122/73 111/77 126/87 111/80  Pulse: (!) 138 (!) 127 (!) 101 (!) 105  Resp: 18 18 12 14   Temp: 97.8 F (36.6 C)     TempSrc: Oral     SpO2: 100% 100% 100% 100%  Weight: (!) 160.1 kg     Height: 5' 2"  (1.575 m)       Constitutional: NAD, calm, obese Eyes: PERTLA, lids and conjunctivae normal ENMT: Mucous membranes are moist. Posterior pharynx clear of any exudate or lesions.   Neck: normal, supple, no masses, no thyromegaly Respiratory: clear to auscultation bilaterally, no wheezing, no crackles. Normal respiratory effort.    Cardiovascular: S1 & S2 heard, regular rate and rhythm. No extremity edema.   Abdomen: No distension, soft, nontender. Bowel sounds active.  Musculoskeletal: no clubbing / cyanosis. No joint deformity upper and lower extremities.    Skin: Erosions with maceration and serous  drainage involving intertriginous regions on abdomen and bilateral groin. Poor turgor. Neurologic: CN 2-12 grossly intact. Sensation intact. Moving all extremities.  Psychiatric: Alert and oriented x 3. Calm, cooperative.    Labs on Admission: I have personally reviewed following labs and imaging studies  CBC: Recent Labs  Lab 07/04/18 2055  WBC 9.7  NEUTROABS 7.4  HGB 9.4*  HCT 32.6*  MCV 77.8*  PLT 935*   Basic Metabolic Panel: Recent Labs  Lab 07/04/18 2055  NA 133*  K 4.2  CL 107  CO2 16*  GLUCOSE 83  BUN 14  CREATININE 1.16*  CALCIUM 8.4*   GFR: Estimated Creatinine Clearance: 97.7 mL/min (A) (by C-G formula based on SCr of 1.16 mg/dL (H)). Liver Function Tests: Recent Labs  Lab 07/04/18 2055  AST 22  ALT 11  ALKPHOS 128*  BILITOT 0.6  PROT 7.3  ALBUMIN 2.4*   No results for input(s): LIPASE, AMYLASE in the last 168 hours. No results for input(s): AMMONIA in the last 168 hours. Coagulation Profile: No results for input(s): INR, PROTIME in the last 168 hours. Cardiac Enzymes: No results for input(s): CKTOTAL, CKMB, CKMBINDEX, TROPONINI in the last 168 hours. BNP (last 3 results) No results for input(s): PROBNP in the last 8760 hours. HbA1C: No results for input(s): HGBA1C in the last 72 hours. CBG: No results for input(s): GLUCAP in the last 168 hours. Lipid Profile: No results for input(s): CHOL, HDL, LDLCALC, TRIG, CHOLHDL, LDLDIRECT in the last 72 hours. Thyroid Function Tests: No results for input(s): TSH, T4TOTAL, FREET4, T3FREE, THYROIDAB in the last 72 hours. Anemia Panel: No results for input(s): VITAMINB12, FOLATE, FERRITIN, TIBC, IRON, RETICCTPCT in the last 72 hours. Urine analysis:    Component Value Date/Time   COLORURINE YELLOW 06/02/2018 0222   APPEARANCEUR CLOUDY (A) 06/02/2018 0222   LABSPEC 1.011 06/02/2018 0222   PHURINE 6.0 06/02/2018 0222   GLUCOSEU NEGATIVE 06/02/2018 0222   HGBUR LARGE (A) 06/02/2018 0222   BILIRUBINUR  NEGATIVE 06/02/2018 0222   KETONESUR 5 (A) 06/02/2018 0222   PROTEINUR NEGATIVE 06/02/2018 0222   UROBILINOGEN 0.2 07/22/2015 0405   NITRITE NEGATIVE 06/02/2018 0222   LEUKOCYTESUR LARGE (A) 06/02/2018 0222   Sepsis Labs: @LABRCNTIP (procalcitonin:4,lacticidven:4) )No results found for this or any previous visit (from the past 240 hour(s)).   Radiological Exams on Admission: No results found.  EKG: Independently reviewed. Sinus tachycardia (rate 131).   Assessment/Plan  1. Hypovolemia  - Presents with lightheadedness, orthostasis, recent vomiting, poor appetite   - Found to be tachycardic in 130's with stable BP, no fever or leukocytosis, and reassuringly normal  lactate  - Blood cultures were collected in ED and 2 liters NS were administered  - Does not appear to be septic, tachycardia improving with IVF  - Continue supportive care with IVF, follow cultures   2. Chronic wounds  - There are several chronic intertriginous erosions involving abdomen and groin   - There is copious serous drainage, maceration, but no purulence noted  - May be related to Crohn's, and/or to body habitus  - Continue wound care   3. Crohn's colitis  - Reports normal stools in colostomy, no bleeding, no pain   - Not currently on medications for this, continue supportive care   4. Mild renal insufficiency  - SCr is 1.16 on admission, similar to priors with apparent baseline ~1-1.1  - She is being fluid resuscitated, will repeat chem panel in am    5. Hyponatremia   - Serum sodium is 132 on admission in setting of hypovolemia  - Treated in ED with 2 liters NS and continued on NS infusion  - Repeat chem panel in am   6. Microcytic anemia  - Hgb is 9.4 on admission, increased from priors in setting of hypovolemia  - No active bleeding noted, likely secondary to chronic disease  - Patient reports not taking her recommended iron-supplements at home, encouraged to do so    DVT prophylaxis: SCD's    Code Status: Full  Family Communication: Discussed with patient  Consults called: None Admission status: Observation    Vianne Bulls, MD [facility] Pager [phone]  If 7PM-7AM, please contact night-coverage [email or web address] Password [facility]  07/04/2018, 11:37 PM

## 2018-07-04 NOTE — ED Notes (Signed)
ED TO INPATIENT HANDOFF REPORT  Name/Age/Gender Dominique [name] 37 y.o. female  Code Status Code Status History    Date Active Date Inactive Code Status Order ID Comments User Context   05/30/2018 1310 06/03/2018 1814 Full Code [id number]  Alma Friendly, MD Inpatient   04/19/2018 1247 04/23/2018 1912 Full Code [id number]  Debbe Odea, MD ED   01/06/2017 0612 01/07/2017 0325 Full Code [id number]  Norval Morton, MD ED   12/25/2016 1257 12/29/2016 1749 Full Code [id number]  Debbe Odea, MD Inpatient   12/25/2016 0910 12/25/2016 1257 Full Code [id number]  Debbe Odea, MD ED   07/21/2015 0935 07/25/2015 2218 Full Code [id number]  Samella Parr, NP Inpatient   06/26/2015 1759 07/21/2015 0935 Full Code [id number]  Cathlyn Parsons, PA-C Inpatient   06/04/2015 0757 06/26/2015 1759 Full Code [id number]  Gennaro Africa, MD Inpatient   11/16/2014 2238 11/23/2014 1747 Full Code [id number]  Berle Mull, MD ED      Home/SNF/Other Home  Chief Complaint Dizziness  Level of Care/Admitting Diagnosis ED Disposition    ED Disposition Condition [place]: [facility] [[id number]]  Level of Care: Telemetry [5]  Admit to tele based on following criteria: Complex arrhythmia (Bradycardia/Tachycardia)  Diagnosis: Hypovolemia [276.52.ICD-9-CM]  Admitting Physician: Vianne Bulls [[id number]]  Attending Physician: Vianne Bulls [[id number]]  PT Class (Do Not Modify): Observation [104]  PT Acc Code (Do Not Modify): Observation [10022]       Medical History Past Medical History:  Diagnosis Date  . Acute acalculous cholecystitis 11/16/2014  . Anal fistula   . Anemia   . Anxiety   . Asthma   . CHF (congestive heart failure) ([place])   . Complication of anesthesia    states she had a problem staying awake after her c-section, was [facility] to [facility]  . Coronary artery disease   . Diverticulitis of colon 11/17/2014  . Headache    used to have migraines  . Hepatic  steatosis 11/17/2014  . Herpes   . Hypertension   . Morbid obesity ([place]) 03/22/2009   Qualifier: Diagnosis of  By: Ronnald Ramp CMA, Chemira    . Neuropathy   . OSA (obstructive sleep apnea) 05/02/2014  . Seasonal allergies 06/13/2014  . Sickle cell anemia ([place])    She states she " has the trait" (07/20/15)  . Sleep apnea    uses Bipap  . Thyromegaly 05/02/2014    Allergies Allergies  Allergen Reactions  . Other Shortness Of Breath and Swelling    Tree nuts  . Penicillins Other (See Comments)    Unknown childhood allergy Has patient had a PCN reaction causing immediate rash, facial/tongue/throat swelling, SOB or lightheadedness with hypotension: Unknown Has patient had a PCN reaction causing severe rash involving mucus membranes or skin necrosis: Unknown Has patient had a PCN reaction that required hospitalization: Unknown Has patient had a PCN reaction occurring within the last 10 years: Unknown If all of the above answers are "NO", then may proceed with Cephalosporin use.     IV Location/Drains/Wounds Patient Lines/Drains/Airways Status   Active Line/Drains/Airways    Name:   Placement date:   Placement time:   Site:   Days:   Peripheral IV 05/30/18 Left Other (Comment)   05/30/18    0357    Other (Comment)   35   Peripheral IV 06/02/18 Left Hand   06/02/18    2148    Hand   32   Peripheral  IV 07/04/18 Left Antecubital   07/04/18    2038    Antecubital   less than 1   Peripheral IV 07/04/18 Left;Upper Arm   07/04/18    2058    Arm   less than 1   Colostomy LUQ   06/11/15    1700    LUQ   1119   Ileostomy RUQ   04/20/18    0118    RUQ   75   Incision (Closed) 06/11/15 Abdomen Other (Comment)   06/11/15    1515     1119   Wound / Incision (Open or Dehisced) 04/22/18 Other (Comment) Abdomen Mid   04/22/18    2200    Abdomen   73   Wound / Incision (Open or Dehisced) 05/30/18 Non-pressure wound Abdomen Mid update 05/30/18-multiple open, wounds & small breaks in skin on upper, lower abd, &  left breast   05/30/18    1500    Abdomen   35   Wound / Incision (Open or Dehisced) 05/30/18 Non-pressure wound Groin Right;Left open, wound/pink/draining purulent fluid (similar to abd wounds   05/30/18    2150    Groin   35   Wound / Incision (Open or Dehisced) 05/30/18 Non-pressure wound Sacrum Mid intergluteal cleft open/pink/minimal drainage noted   05/30/18    2150    Sacrum   35   Wound / Incision (Open or Dehisced) 05/30/18 Non-pressure wound Back Right;Left;Lower under folds of lower back, smal broken areas noted/pink/purlent drainage noted   05/30/18    2150    Back   35          Labs/Imaging Results for orders placed or performed during the hospital encounter of 07/04/18 (from the past 48 hour(s))  Comprehensive metabolic panel     Status: Abnormal   Collection Time: 07/04/18  8:55 PM  Result Value Ref Range   Sodium 133 (L) 135 - 145 mmol/L   Potassium 4.2 3.5 - 5.1 mmol/L   Chloride 107 98 - 111 mmol/L   CO2 16 (L) 22 - 32 mmol/L   Glucose, Bld 83 70 - 99 mg/dL   BUN 14 6 - 20 mg/dL   Creatinine, Ser 1.16 (H) 0.44 - 1.00 mg/dL   Calcium 8.4 (L) 8.9 - 10.3 mg/dL   Total Protein 7.3 6.5 - 8.1 g/dL   Albumin 2.4 (L) 3.5 - 5.0 g/dL   AST 22 15 - 41 U/L   ALT 11 0 - 44 U/L   Alkaline Phosphatase 128 (H) 38 - 126 U/L   Total Bilirubin 0.6 0.3 - 1.2 mg/dL   GFR calc non Af Amer 59 (L) >60 mL/min   GFR calc Af Amer >60 >60 mL/min    Comment: (NOTE) The eGFR has been calculated using the CKD EPI equation. This calculation has not been validated in all clinical situations. eGFR's persistently <60 mL/min signify possible Chronic Kidney Disease.    Anion gap 10 5 - 15    Comment: Performed at [facility], [place] [address], [place], [place] [address]  CBC with Differential     Status: Abnormal   Collection Time: 07/04/18  8:55 PM  Result Value Ref Range   WBC 9.7 4.0 - 10.5 K/uL   RBC 4.19 3.87 - 5.11 MIL/uL   Hemoglobin 9.4 (L) 12.0 - 15.0 g/dL   HCT  32.6 (L) 36.0 - 46.0 %   MCV 77.8 (L) 80.0 - 100.0 fL   MCH 22.4 (L)  26.0 - 34.0 pg   MCHC 28.8 (L) 30.0 - 36.0 g/dL   RDW 22.8 (H) 11.5 - 15.5 %   Platelets 448 (H) 150 - 400 K/uL   nRBC 0.2 0.0 - 0.2 %   Neutrophils Relative % 76 %   Neutro Abs 7.4 1.7 - 7.7 K/uL   Lymphocytes Relative 11 %   Lymphs Abs 1.1 0.7 - 4.0 K/uL   Monocytes Relative 12 %   Monocytes Absolute 1.1 (H) 0.1 - 1.0 K/uL   Eosinophils Relative 0 %   Eosinophils Absolute 0.0 0.0 - 0.5 K/uL   Basophils Relative 0 %   Basophils Absolute 0.0 0.0 - 0.1 K/uL   Immature Granulocytes 1 %   Abs Immature Granulocytes 0.10 (H) 0.00 - 0.07 K/uL   Polychromasia PRESENT     Comment: Performed at [facility], [place] [address], [place],  [address]  I-Stat beta hCG blood, ED     Status: None   Collection Time: 07/04/18  9:14 PM  Result Value Ref Range   I-stat hCG, quantitative <5.0 <5 mIU/mL   Comment 3            Comment:   GEST. AGE      CONC.  (mIU/mL)   <=1 WEEK        5 - 50     2 WEEKS       50 - 500     3 WEEKS       100 - 10,000     4 WEEKS     1,000 - 30,000        FEMALE AND NON-PREGNANT FEMALE:     LESS THAN 5 mIU/mL   I-Stat CG4 Lactic Acid, ED     Status: None   Collection Time: 07/04/18 10:14 PM  Result Value Ref Range   Lactic Acid, Venous 1.08 0.5 - 1.9 mmol/L  I-Stat Beta hCG blood, ED ([facility], [facility], [facility] only)     Status: None   Collection Time: 07/04/18 10:24 PM  Result Value Ref Range   I-stat hCG, quantitative <5.0 <5 mIU/mL   Comment 3            Comment:   GEST. AGE      CONC.  (mIU/mL)   <=1 WEEK        5 - 50     2 WEEKS       50 - 500     3 WEEKS       100 - 10,000     4 WEEKS     1,000 - 30,000        FEMALE AND NON-PREGNANT FEMALE:     LESS THAN 5 mIU/mL    No results found. EKG Interpretation  Date/Time:  Saturday July 04 2018 19:56:06 EDT Ventricular Rate:  131 PR Interval:    QRS Duration: 88 QT Interval:  298 QTC Calculation: 440 R Axis:   -43 Text  Interpretation:  Sinus tachycardia Consider right atrial enlargement Left axis deviation Consider anterior infarct Abnormal T, consider ischemia, lateral leads No significant change since last tracing Confirmed by Orlie Dakin [phone]) on 07/04/2018 8:03:26 PM   Pending Labs Unresulted Labs (From admission, onward)    Start     Ordered   07/04/18 2014  Blood culture (routine x 2)  BLOOD CULTURE X 2,   STAT     07/04/18 2016   07/04/18 2002  Urinalysis, Routine w reflex microscopic  STAT,  STAT     07/04/18 2001          Vitals/Pain Today's Vitals   07/04/18 1954 07/04/18 2000 07/04/18 2142 07/04/18 2230  BP: 122/73 111/77 126/87 111/80  Pulse: (!) 138 (!) 127 (!) 101 (!) 105  Resp: 18 18 12 14   Temp: 97.8 F (36.6 C)     TempSrc: Oral     SpO2: 100% 100% 100% 100%  Weight: (!) 160.1 kg     Height: 5' 2"  (1.575 m)       Isolation Precautions No active isolations  Medications Medications  sodium chloride 0.45 % bolus 2,000 mL (2,000 mLs Intravenous New Bag/Given 07/04/18 2109)  HYDROmorphone (DILAUDID) injection 1 mg (1 mg Intravenous Given 07/04/18 2141)  metoCLOPramide (REGLAN) injection 10 mg (10 mg Intravenous Given 07/04/18 2153)    Mobility walks

## 2018-07-04 NOTE — ED Triage Notes (Signed)
Patient presents with dizziness and similar symptoms to sepsis she had x3 weeks ago. Patient has lesions on her back and stomach, the worst being one near her colostomy bag. Patient tachycardic en route. IV attempts unsuccessful by EMS.

## 2018-07-04 NOTE — ED Notes (Signed)
Bed: RESB Expected date:  Expected time:  Means of arrival:  Comments: 37 yo F/dizziness poss sepsis

## 2018-07-04 NOTE — ED Notes (Signed)
Patient nauseated post- pain medication. EDP made aware, verbal order given.

## 2018-07-04 NOTE — ED Provider Notes (Addendum)
[name] DEPT Provider Note   CSN: [id number] Arrival date & time: 07/04/18  1939     History   Chief Complaint Chief Complaint  Patient presents with  . Dizziness    HPI Dominique [name] is a 37 y.o. female.  Planes of dizziness meaning lightheadedness progressively worsening approximately a week ago.  She is putting out normal stools through her colostomy bag.  She denies any fever.  Does admit to diminished appetite for the past several days.  She denies urinary symptoms.  Currently on menses.  No treatment prior to come  HPI  Past Medical History:  Diagnosis Date  . Acute acalculous cholecystitis 11/16/2014  . Anal fistula   . Anemia   . Anxiety   . Asthma   . CHF (congestive heart failure) ([place])   . Complication of anesthesia    states she had a problem staying awake after her c-section, was transferred from [facility] to [facility]  . Coronary artery disease   . Diverticulitis of colon 11/17/2014  . Headache    used to have migraines  . Hepatic steatosis 11/17/2014  . Herpes   . Hypertension   . Morbid obesity ([place]) 03/22/2009   Qualifier: Diagnosis of  By: Ronnald Ramp CMA, [place]    . Neuropathy   . OSA (obstructive sleep apnea) 05/02/2014  . Seasonal allergies 06/13/2014  . Sickle cell anemia ([place])    She states she " has the trait" (07/20/15)  . Sleep apnea    uses Bipap  . Thyromegaly 05/02/2014    Patient Active Problem List   Diagnosis Date Noted  . Coronary artery disease   . Cellulitis 05/30/2018  . AKI (acute kidney injury) ([place]) 04/20/2018  . Abnormal TSH 04/20/2018  . Redness of both eyes 04/20/2018  . Eye pain, right 04/20/2018  . Multiple wounds of skin 04/19/2018  . 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 Colostomy in place [facility]) 01/06/2017  . 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 Infliximab (Remicade) long-term use 01/06/2017  . Crohn's colitis, with abscess ([place]) 01/06/2017  .  Abdominal abscess 12/25/2016  . Chronic anemia 12/25/2016  . Hypokalemia 07/21/2015  . Adjustment disorder with mixed anxiety and depressed mood   . Physical deconditioning 06/26/2015  . Severe left ventricular systolic dysfunction 82/50/5397  . Pulmonary hypertension ([place])   . Crohn's colitis with perforation s/p left colectomy/colostomy 2016   . OSA on CPAP   . Obesity hypoventilation syndrome ([place])   . Pressure ulcer 06/16/2015  . Protein-calorie malnutrition, severe ([place]) 06/10/2015  . Gastritis 04/07/2015  . Iron deficiency anemia 04/07/2015  . Sinus tachycardia ([place]) 12/29/2014  . Lumbar back pain 12/06/2014  . Rectal bleeding 11/19/2014  . Diverticulitis of colon 11/17/2014  . Hepatic steatosis 11/17/2014  . Seasonal allergies 06/13/2014  . Thyromegaly 05/02/2014  . AMENORRHEA 02/07/2010  . Morbid obesity- BMI 67.5 03/22/2009  . Sickle cell trait ([place]) 10/20/2007    Past Surgical History:  Procedure Laterality Date  . c section 2011  2011  . COLONOSCOPY WITH PROPOFOL N/A 12/27/2015   Procedure: COLONOSCOPY WITH PROPOFOL;  Surgeon: Wonda Horner, MD;  Location: [facility] ENDOSCOPY;  Service: Endoscopy;  Laterality: N/A;  . COLOSTOMY N/A 06/11/2015   Procedure:  CREATION OF COLOSTOMY;  Surgeon: Georganna Skeans, MD;  Location: [place];  Service: General;  Laterality: N/A;  . ESOPHAGOGASTRODUODENOSCOPY N/A 07/19/2015   Procedure: ESOPHAGOGASTRODUODENOSCOPY (EGD);  Surgeon: Clarene Essex, MD;  Location: [facility] ENDOSCOPY;  Service: Endoscopy;  Laterality: N/A;  . FLEXIBLE SIGMOIDOSCOPY N/A 06/05/2015   Procedure: FLEXIBLE SIGMOIDOSCOPY;  Surgeon: Ronald Lobo, MD;  Location: [facility] ENDOSCOPY;  Service: Endoscopy;  Laterality: N/A;  . LAPAROTOMY N/A 06/09/2015   Procedure: EXPLORATORY LAPAROTOMY, DRAINAGE OF INTRAABDOMINAL ABSCESS, PARTIAL COLON RESECTION,  APPLICATION OF WOUND VAC;  Surgeon: Georganna Skeans, MD;  Location: [place];  Service: General;  Laterality: N/A;  . LAPAROTOMY N/A 06/11/2015    Procedure: RE-EXPLORATION OF ABDOMEN;  Surgeon: Georganna Skeans, MD;  Location: [place];  Service: General;  Laterality: N/A;     OB History   None      Home Medications    Prior to Admission medications   Medication Sig Start Date End Date Taking? Authorizing Provider  albuterol (PROVENTIL) (2.5 MG/3ML) 0.083% nebulizer solution Take 3 mLs (2.5 mg total) by nebulization 2 (two) times daily as needed for wheezing. 06/03/18   Kayleen Memos, DO  ferrous sulfate 325 (65 FE) MG tablet Take 1 tablet (325 mg total) by mouth daily with breakfast. 06/04/18   Kayleen Memos, DO  HYDROcodone-acetaminophen (NORCO/VICODIN) 5-325 MG tablet Take 1 tablet by mouth every 8 (eight) hours as needed for severe pain. 06/03/18   Kayleen Memos, DO  Hydrocortisone (GERHARDT'S BUTT CREAM) CREA Apply 1 application topically 3 (three) times daily. 06/03/18   Kayleen Memos, DO  metoprolol tartrate (LOPRESSOR) 25 MG tablet Take 1 tablet (25 mg total) by mouth 2 (two) times daily. 06/03/18   Kayleen Memos, DO  polyethylene glycol (MIRALAX / GLYCOLAX) packet Take 17 g by mouth daily as needed for mild constipation. 06/03/18   Kayleen Memos, DO  tobramycin (TOBREX) 0.3 % ophthalmic solution Place 2 drops into both eyes every 6 (six) hours. 06/03/18   Kayleen Memos, DO    Family History Family History  Problem Relation Age of Onset  . Hypertension Mother   . Allergies Mother   . Hypertension Father   . Cancer Paternal Grandmother        lung  . Hypertension Sister   . Asthma Brother   . Diabetes Maternal Grandmother   . Hypertension Maternal Grandmother   . Hypertension Maternal Grandfather   . Emphysema Paternal Grandfather     Social History Social History   Tobacco Use  . Smoking status: Current Some Day Smoker    Packs/day: 0.50    Years: 4.00    Pack years: 2.00    Types: Cigarettes    Start date: 07/24/2014    Last attempt to quit: 05/27/2015    Years since quitting: 3.1  . Smokeless tobacco: Never  Used  Substance Use Topics  . Alcohol use: No    Alcohol/week: 0.0 standard drinks  . Drug use: No     Allergies   Other and Penicillins   Review of Systems Review of Systems  Constitutional: Positive for appetite change.  Eyes: Positive for visual disturbance.       Blind in right eye  Gastrointestinal:       Chronic open wound to abdomen and wounds to groin bilaterally.  Colostomy bag in place  Neurological: Positive for light-headedness.  All other systems reviewed and are negative.    Physical Exam Updated Vital Signs BP 122/73 (BP Location: Left Arm)   Pulse (!) 138   Temp 97.8 F (36.6 C) (Oral)   Resp 18   Ht 5' 2"  (1.575 m)   Wt (!) 160.1 kg   SpO2 100%   BMI 64.56 kg/m  Physical Exam  Constitutional:  Chronically ill-appearing  HENT:  Head: Normocephalic and atraumatic.  Mucous membranes dry  Eyes: Conjunctivae and EOM are normal.  Right cornea opacified.  Some conjunctival erythema right eye  Neck: Neck supple. No tracheal deviation present. No thyromegaly present.  Cardiovascular: Regular rhythm.  No murmur heard. Tachycardic  Pulmonary/Chest: Effort normal and breath sounds normal.  Abdominal: Soft. Bowel sounds are normal. She exhibits no distension. There is tenderness. There is no guarding.  Ostomy in place, open wound to abdomen which is clean appearing.  Morbidly obese.  Minimally tender at epigastrium  Musculoskeletal: Normal range of motion. She exhibits no edema or tenderness.  Neurological: She is alert. Coordination normal.  Skin: Skin is warm and dry. No rash noted.  Open wounds of bilateral inguinal creases which are draining yellowish pus.  Psychiatric: She has a normal mood and affect.  Nursing note and vitals reviewed.    ED Treatments / Results  Labs (all labs ordered are listed, but only abnormal results are displayed) Labs Reviewed  CULTURE, BLOOD (ROUTINE X 2)  CULTURE, BLOOD (ROUTINE X 2)  COMPREHENSIVE METABOLIC  PANEL  CBC WITH DIFFERENTIAL/PLATELET  URINALYSIS, ROUTINE W REFLEX MICROSCOPIC  I-STAT CG4 LACTIC ACID, ED  I-STAT BETA HCG BLOOD, ED ([facility], [facility], [facility] ONLY)    EKG EKG Interpretation  Date/Time:  Saturday July 04 2018 19:56:06 EDT Ventricular Rate:  131 PR Interval:    QRS Duration: 88 QT Interval:  298 QTC Calculation: 440 R Axis:   -43 Text Interpretation:  Sinus tachycardia Consider right atrial enlargement Left axis deviation Consider anterior infarct Abnormal T, consider ischemia, lateral leads No significant change since last tracing Confirmed by Orlie Dakin [phone]) on 07/04/2018 8:03:26 PM   Radiology No results found.  Procedures Procedures (including critical care time)  Medications Ordered in ED Medications  sodium chloride 0.45 % bolus 2,000 mL (has no administration in time range)   11:25 PM patient feels improved after treatment with intravenous hydration normal saline.  She is less tachycardic.  She became nauseated after treatment with intravenous hydromorphone for chronic pain in her wounds.  Nausea resolved after treatment with venous Reglan. Results for orders placed or performed during the hospital encounter of 07/04/18  Comprehensive metabolic panel  Result Value Ref Range   Sodium 133 (L) 135 - 145 mmol/L   Potassium 4.2 3.5 - 5.1 mmol/L   Chloride 107 98 - 111 mmol/L   CO2 16 (L) 22 - 32 mmol/L   Glucose, Bld 83 70 - 99 mg/dL   BUN 14 6 - 20 mg/dL   Creatinine, Ser 1.16 (H) 0.44 - 1.00 mg/dL   Calcium 8.4 (L) 8.9 - 10.3 mg/dL   Total Protein 7.3 6.5 - 8.1 g/dL   Albumin 2.4 (L) 3.5 - 5.0 g/dL   AST 22 15 - 41 U/L   ALT 11 0 - 44 U/L   Alkaline Phosphatase 128 (H) 38 - 126 U/L   Total Bilirubin 0.6 0.3 - 1.2 mg/dL   GFR calc non Af Amer 59 (L) >60 mL/min   GFR calc Af Amer >60 >60 mL/min   Anion gap 10 5 - 15  CBC with Differential  Result Value Ref Range   WBC 9.7 4.0 - 10.5 K/uL   RBC 4.19 3.87 - 5.11 MIL/uL   Hemoglobin 9.4 (L) 12.0 -  15.0 g/dL   HCT 32.6 (L) 36.0 - 46.0 %   MCV 77.8 (L) 80.0 - 100.0 fL   MCH  22.4 (L) 26.0 - 34.0 pg   MCHC 28.8 (L) 30.0 - 36.0 g/dL   RDW 22.8 (H) 11.5 - 15.5 %   Platelets 448 (H) 150 - 400 K/uL   nRBC 0.2 0.0 - 0.2 %   Neutrophils Relative % 76 %   Neutro Abs 7.4 1.7 - 7.7 K/uL   Lymphocytes Relative 11 %   Lymphs Abs 1.1 0.7 - 4.0 K/uL   Monocytes Relative 12 %   Monocytes Absolute 1.1 (H) 0.1 - 1.0 K/uL   Eosinophils Relative 0 %   Eosinophils Absolute 0.0 0.0 - 0.5 K/uL   Basophils Relative 0 %   Basophils Absolute 0.0 0.0 - 0.1 K/uL   Immature Granulocytes 1 %   Abs Immature Granulocytes 0.10 (H) 0.00 - 0.07 K/uL   Polychromasia PRESENT   I-Stat CG4 Lactic Acid, ED  Result Value Ref Range   Lactic Acid, Venous 1.08 0.5 - 1.9 mmol/L  I-Stat beta hCG blood, ED  Result Value Ref Range   I-stat hCG, quantitative <5.0 <5 mIU/mL   Comment 3          I-Stat Beta hCG blood, ED ([facility], [facility], [facility] only)  Result Value Ref Range   I-stat hCG, quantitative <5.0 <5 mIU/mL   Comment 3           No results found. Initial Impression / Assessment and Plan / ED Course  I have reviewed the triage vital signs and the nursing notes.  Pertinent labs & imaging results that were available during my care of the patient were reviewed by me and considered in my medical decision making (see chart for details).     Patient reports chronic pain at open wounds.  Unchanged today.  She presents today due to "dizziness" Clinically she is dehydrated.  Patient likely needs more intensive wound therapy than what she is currently getting.  She currently has a home health nurse come to her home once per week. Consulted Dr. Myna Hidalgo who will arrange for overnight stay. Lab work consistent with mild anemia which is chronic, hypoalbuminemia likely secondary to malnutrition and mild renal insufficiency which is chronic Final Clinical Impressions(s) / ED Diagnoses  Diagnoses #1 dehydration #2 chronic wounds #3  chronic pain #4 chronic renal insufficiency #5 malnutrition Final diagnoses:  None    ED Discharge Orders    None       Orlie Dakin, MD 07/04/18 9355    Orlie Dakin, MD 07/04/18 2353

## 2018-07-04 NOTE — ED Notes (Signed)
Patient refusing in and out cath

## 2018-07-05 DIAGNOSIS — N289 Disorder of kidney and ureter, unspecified: Secondary | ICD-10-CM | POA: Diagnosis not present

## 2018-07-05 DIAGNOSIS — K50118 Crohn's disease of large intestine with other complication: Secondary | ICD-10-CM | POA: Diagnosis not present

## 2018-07-05 DIAGNOSIS — E871 Hypo-osmolality and hyponatremia: Secondary | ICD-10-CM | POA: Diagnosis not present

## 2018-07-05 DIAGNOSIS — E861 Hypovolemia: Secondary | ICD-10-CM | POA: Diagnosis not present

## 2018-07-05 DIAGNOSIS — I951 Orthostatic hypotension: Secondary | ICD-10-CM

## 2018-07-05 LAB — URINALYSIS, ROUTINE W REFLEX MICROSCOPIC
Bilirubin Urine: NEGATIVE
GLUCOSE, UA: NEGATIVE mg/dL
HGB URINE DIPSTICK: NEGATIVE
Ketones, ur: 5 mg/dL — AB
LEUKOCYTES UA: NEGATIVE
NITRITE: NEGATIVE
PH: 5 (ref 5.0–8.0)
Protein, ur: 30 mg/dL — AB
Specific Gravity, Urine: 1.02 (ref 1.005–1.030)

## 2018-07-05 LAB — CBC WITH DIFFERENTIAL/PLATELET
Abs Immature Granulocytes: 0.11 10*3/uL — ABNORMAL HIGH (ref 0.00–0.07)
BASOS PCT: 0 %
Basophils Absolute: 0 10*3/uL (ref 0.0–0.1)
EOS ABS: 0 10*3/uL (ref 0.0–0.5)
EOS PCT: 0 %
HEMATOCRIT: 30.6 % — AB (ref 36.0–46.0)
Hemoglobin: 9.3 g/dL — ABNORMAL LOW (ref 12.0–15.0)
Immature Granulocytes: 1 %
LYMPHS PCT: 14 %
Lymphs Abs: 1.6 10*3/uL (ref 0.7–4.0)
MCH: 22.7 pg — AB (ref 26.0–34.0)
MCHC: 30.4 g/dL (ref 30.0–36.0)
MCV: 74.8 fL — AB (ref 80.0–100.0)
MONO ABS: 1.3 10*3/uL — AB (ref 0.1–1.0)
Monocytes Relative: 11 %
NEUTROS ABS: 8.7 10*3/uL — AB (ref 1.7–7.7)
Neutrophils Relative %: 74 %
Platelets: 434 10*3/uL — ABNORMAL HIGH (ref 150–400)
RBC: 4.09 MIL/uL (ref 3.87–5.11)
RDW: 22.2 % — AB (ref 11.5–15.5)
WBC: 11.8 10*3/uL — AB (ref 4.0–10.5)
nRBC: 0 % (ref 0.0–0.2)

## 2018-07-05 LAB — BASIC METABOLIC PANEL
Anion gap: 10 (ref 5–15)
BUN: 13 mg/dL (ref 6–20)
CHLORIDE: 109 mmol/L (ref 98–111)
CO2: 15 mmol/L — ABNORMAL LOW (ref 22–32)
CREATININE: 0.96 mg/dL (ref 0.44–1.00)
Calcium: 8.3 mg/dL — ABNORMAL LOW (ref 8.9–10.3)
GFR calc Af Amer: >60 mL/min (ref >60)
GFR calc non Af Amer: >60 mL/min (ref >60)
GLUCOSE: 72 mg/dL (ref 70–99)
Potassium: 4.4 mmol/L (ref 3.5–5.1)
SODIUM: 134 mmol/L — AB (ref 135–145)

## 2018-07-05 LAB — CREATININE, URINE, RANDOM: CREATININE, URINE: 137.41 mg/dL

## 2018-07-05 LAB — SODIUM, URINE, RANDOM: SODIUM UR: 16 mmol/L

## 2018-07-05 MED ORDER — HYDROMORPHONE HCL 1 MG/ML IJ SOLN
1.0000 mg | INTRAMUSCULAR | Status: AC | PRN
  Administered 2018-07-05 (×2): 1 mg via INTRAVENOUS
  Filled 2018-07-05 (×2): qty 1

## 2018-07-05 MED ORDER — HYDROMORPHONE HCL 1 MG/ML IJ SOLN
0.5000 mg | INTRAMUSCULAR | Status: AC | PRN
  Administered 2018-07-05 – 2018-07-06 (×3): 0.5 mg via INTRAVENOUS
  Filled 2018-07-05 (×3): qty 0.5

## 2018-07-05 MED ORDER — LOPERAMIDE HCL 2 MG PO CAPS
2.0000 mg | ORAL_CAPSULE | ORAL | Status: DC | PRN
  Administered 2018-07-05 – 2018-07-13 (×2): 2 mg via ORAL
  Filled 2018-07-05 (×2): qty 1

## 2018-07-05 MED ORDER — METOPROLOL TARTRATE 5 MG/5ML IV SOLN
5.0000 mg | INTRAVENOUS | Status: AC | PRN
  Administered 2018-07-05 – 2018-07-09 (×2): 5 mg via INTRAVENOUS
  Filled 2018-07-05 (×2): qty 5

## 2018-07-05 MED ORDER — SODIUM CHLORIDE 0.45 % IV BOLUS
2000.0000 mL | Freq: Once | INTRAVENOUS | Status: AC
  Administered 2018-07-05: 2000 mL via INTRAVENOUS

## 2018-07-05 MED ORDER — MORPHINE SULFATE (PF) 4 MG/ML IV SOLN
4.0000 mg | INTRAVENOUS | Status: AC | PRN
  Administered 2018-07-05 (×2): 4 mg via INTRAVENOUS
  Filled 2018-07-05 (×2): qty 1

## 2018-07-05 MED ORDER — SODIUM BICARBONATE 650 MG PO TABS
650.0000 mg | ORAL_TABLET | Freq: Three times a day (TID) | ORAL | Status: DC
  Administered 2018-07-05 – 2018-07-07 (×9): 650 mg via ORAL
  Filled 2018-07-05 (×10): qty 1

## 2018-07-05 MED ORDER — LIP MEDEX EX OINT
TOPICAL_OINTMENT | CUTANEOUS | Status: AC
  Administered 2018-07-05: 12:00:00
  Filled 2018-07-05: qty 7

## 2018-07-05 NOTE — Progress Notes (Signed)
Pt unable to complete Orthostatic VS this shift d/t pain and pt not willing to attempt getting OOB. Pt states she does not feel comfortable at this point d/t pain and weakness.

## 2018-07-05 NOTE — Progress Notes (Signed)
Patient has been in sinus tachycardia, rate 130s-140s. PCP was notified. There are new orders.

## 2018-07-05 NOTE — Progress Notes (Signed)
Dressing changes done- pt has multiple wounds to abdomen, sides, back and buttocks. Pt has a wound in almost every skin fold, many draining with large amounts of purulent drainage. Many of the wounds are very deep. Large wound to buttocks, unable to assess how deep wound truly is d/t extreme pain. Pt screamed and cried out in pain during dressing change- every wound is extremely painful. Py lying in bed, continuing to cry at this time. MD contacted- will administer Morphine as ordered.

## 2018-07-05 NOTE — Progress Notes (Signed)
PT Cancellation Note  Patient Details Name: Daveigh Batty MRN: [id number] DOB: 01/28/1981   Cancelled Treatment:    Reason Eval/Treat Not Completed: Pain limiting ability to participate, per RN.    Tresa Endo [place] Pager [phone] Office [phone]  07/05/2018, 4:00 PM

## 2018-07-05 NOTE — Progress Notes (Signed)
CPAP offered to patient who states that she does not wear at home and does not tolerate.  Patient does not want to attempt to wear the device at this facility.  RN, notified of refusal.  Will be available if patient changes her mind.

## 2018-07-05 NOTE — Progress Notes (Signed)
Pt declined cpap.  Pt encouraged to call should she change her mind.

## 2018-07-05 NOTE — Progress Notes (Signed)
[facility] PROGRESS NOTE    Progress Note  Dominique [name]  XNT:[id number] DOB: Jul 12, 1981 DOA: 07/04/2018 PCP: Benito Mccreedy, MD     Brief Narrative:   Dominique [name] is an 37 y.o. female past medical history significant for obesity obstructive sleep apnea Crohn's, status post perforation with colostomy chronic wounds and abdomen and bilateral groin, who was in his usual health until she developed recurrent nausea and vomiting several days ago, she is not lightheaded  Assessment/Plan:   Hypovolemia/orthostatic hypotension: She was started on IV fluids, blood cultures collected in the ED. She remains tachycardic with a mild leukocytosis, remains afebrile On admission she is 137.6 kg, her previous weights have been ranging around 165 kg Need to gets daily standing weights continue IV fluid hydration. Discontinue telemetry. Orthostatic vitals not checked on admission we will recheck them this morning continue IV fluids.  Chronic wounds: We have consulted wound care  Crohn's colitis with perforation s/p left ileostomy 2017 Not currently on any medication. She relates she is having significant output through her ostomy.  Acute kidney injury: Likely prerenal in etiology improving with IV fluids.  Non-anion gap metabolic acidosis: With an anion gap of 10: Her renal function is at baseline, so likely significant amount of output through her ostomy  Hyponatremia: Likely hypovolemic, on admission 132 with IV fluids 134.  Obstructive sleep apnea continue CPAP.  Microcytic anemia: Seems to be stable.   DVT prophylaxis: lovenox Family Communication:none Disposition Plan/Barrier to D/C: home in am Code Status:     Code Status Orders  (From admission, onward)         Start     Ordered   07/04/18 2335  Full code  Continuous     07/04/18 2337        Code Status History    Date Active Date Inactive Code Status Order ID Comments User Context   05/30/2018 1310  06/03/2018 1814 Full Code [id number]  Alma Friendly, MD Inpatient   04/19/2018 1247 04/23/2018 1912 Full Code [id number]  Debbe Odea, MD ED   01/06/2017 0612 01/07/2017 0325 Full Code [id number]  Norval Morton, MD ED   12/25/2016 1257 12/29/2016 1749 Full Code [id number]  Debbe Odea, MD Inpatient   12/25/2016 0910 12/25/2016 1257 Full Code [id number]  Debbe Odea, MD ED   07/21/2015 0935 07/25/2015 2218 Full Code [id number]  Samella Parr, NP Inpatient   06/26/2015 1759 07/21/2015 0935 Full Code [id number]  Cathlyn Parsons, PA-C Inpatient   06/04/2015 0757 06/26/2015 1759 Full Code [id number]  Gennaro Africa, MD Inpatient   11/16/2014 2238 11/23/2014 1747 Full Code [id number]  Berle Mull, MD ED        IV Access:    Peripheral IV   Procedures and diagnostic studies:   No results found.   Medical Consultants:    None.  Anti-Infectives:   None  Subjective:    Dominique [name] relates she is having pain around her groin area.  Objective:    Vitals:   07/04/18 2230 07/04/18 2339 07/05/18 0540 07/05/18 0634  BP: 111/80 (!) 143/75 138/90   Pulse: (!) 105 (!) 124 (!) 140 (!) 117  Resp: 14 20 18    Temp:  98.5 F (36.9 C) 98.2 F (36.8 C)   TempSrc:  Oral Oral   SpO2: 100% 92% 100%   Weight:  (!) 137.6 kg (!) 137.8 kg   Height:  5' 2"  (1.575 m)      Intake/Output Summary (Last 24 hours) at 07/05/2018 0759  Last data filed at 07/05/2018 0544 Gross per 24 hour  Intake -  Output 450 ml  Net -450 ml   Filed Weights   07/04/18 1954 07/04/18 2339 07/05/18 0540  Weight: (!) 160.1 kg (!) 137.6 kg (!) 137.8 kg    Exam: General exam: In no acute distress. Respiratory system: Good air movement and clear to auscultation. Cardiovascular system: S1 & S2 heard, RRR. No JVD, murmurs, rubs, gallops or clicks.  Gastrointestinal system: Abdomen is nondistended, soft and nontender.  Central nervous system: Alert and oriented. No focal neurological deficits. Extremities: No pedal  edema. Skin: Multiple macerated areas around her groin and folds.  Try to keep them dry Psychiatry: Judgement and insight appear normal. Mood & affect appropriate.    Data Reviewed:    Labs: Basic Metabolic Panel: Recent Labs  Lab 07/04/18 2055 07/05/18 0522  NA 133* 134*  K 4.2 4.4  CL 107 109  CO2 16* 15*  GLUCOSE 83 72  BUN 14 13  CREATININE 1.16* 0.96  CALCIUM 8.4* 8.3*   GFR Estimated Creatinine Clearance: 106.9 mL/min (by C-G formula based on SCr of 0.96 mg/dL). Liver Function Tests: Recent Labs  Lab 07/04/18 2055  AST 22  ALT 11  ALKPHOS 128*  BILITOT 0.6  PROT 7.3  ALBUMIN 2.4*   No results for input(s): LIPASE, AMYLASE in the last 168 hours. No results for input(s): AMMONIA in the last 168 hours. Coagulation profile No results for input(s): INR, PROTIME in the last 168 hours.  CBC: Recent Labs  Lab 07/04/18 2055 07/05/18 0522  WBC 9.7 11.8*  NEUTROABS 7.4 8.7*  HGB 9.4* 9.3*  HCT 32.6* 30.6*  MCV 77.8* 74.8*  PLT 448* 434*   Cardiac Enzymes: No results for input(s): CKTOTAL, CKMB, CKMBINDEX, TROPONINI in the last 168 hours. BNP (last 3 results) No results for input(s): PROBNP in the last 8760 hours. CBG: No results for input(s): GLUCAP in the last 168 hours. D-Dimer: No results for input(s): DDIMER in the last 72 hours. Hgb A1c: No results for input(s): HGBA1C in the last 72 hours. Lipid Profile: No results for input(s): CHOL, HDL, LDLCALC, TRIG, CHOLHDL, LDLDIRECT in the last 72 hours. Thyroid function studies: No results for input(s): TSH, T4TOTAL, T3FREE, THYROIDAB in the last 72 hours.  Invalid input(s): FREET3 Anemia work up: No results for input(s): VITAMINB12, FOLATE, FERRITIN, TIBC, IRON, RETICCTPCT in the last 72 hours. Sepsis Labs: Recent Labs  Lab 07/04/18 2055 07/04/18 2214 07/05/18 0522  WBC 9.7  --  11.8*  LATICACIDVEN  --  1.08  --    Microbiology No results found for this or any previous visit (from the past 240  hour(s)).   Medications:   . metoprolol tartrate  25 mg Oral BID  . sodium chloride flush  3 mL Intravenous Q12H   Continuous Infusions: . sodium chloride 125 mL/hr at 07/05/18 0736    LOS: 0 days   Charlynne Cousins  [facility] Pager [phone]  *Please refer to [email or web address], password [email or web address] to get updated schedule on who will round on this patient, as hospitalists switch teams weekly. If 7PM-7AM, please contact night-coverage at [email or web address], password [email or web address] for any overnight needs.  07/05/2018, 7:59 AM

## 2018-07-06 DIAGNOSIS — E440 Moderate protein-calorie malnutrition: Secondary | ICD-10-CM | POA: Diagnosis present

## 2018-07-06 DIAGNOSIS — S31109A Unspecified open wound of abdominal wall, unspecified quadrant without penetration into peritoneal cavity, initial encounter: Secondary | ICD-10-CM | POA: Diagnosis not present

## 2018-07-06 DIAGNOSIS — E861 Hypovolemia: Secondary | ICD-10-CM | POA: Diagnosis present

## 2018-07-06 DIAGNOSIS — G4733 Obstructive sleep apnea (adult) (pediatric): Secondary | ICD-10-CM | POA: Diagnosis not present

## 2018-07-06 DIAGNOSIS — I951 Orthostatic hypotension: Secondary | ICD-10-CM | POA: Diagnosis present

## 2018-07-06 DIAGNOSIS — K50118 Crohn's disease of large intestine with other complication: Secondary | ICD-10-CM | POA: Diagnosis not present

## 2018-07-06 DIAGNOSIS — H18603 Keratoconus, unspecified, bilateral: Secondary | ICD-10-CM | POA: Diagnosis not present

## 2018-07-06 DIAGNOSIS — H5789 Other specified disorders of eye and adnexa: Secondary | ICD-10-CM | POA: Diagnosis not present

## 2018-07-06 DIAGNOSIS — D649 Anemia, unspecified: Secondary | ICD-10-CM | POA: Diagnosis not present

## 2018-07-06 DIAGNOSIS — K5010 Crohn's disease of large intestine without complications: Secondary | ICD-10-CM | POA: Diagnosis present

## 2018-07-06 DIAGNOSIS — Z9989 Dependence on other enabling machines and devices: Secondary | ICD-10-CM | POA: Diagnosis not present

## 2018-07-06 DIAGNOSIS — D509 Iron deficiency anemia, unspecified: Secondary | ICD-10-CM | POA: Diagnosis not present

## 2018-07-06 DIAGNOSIS — E860 Dehydration: Secondary | ICD-10-CM | POA: Diagnosis present

## 2018-07-06 DIAGNOSIS — I509 Heart failure, unspecified: Secondary | ICD-10-CM | POA: Diagnosis not present

## 2018-07-06 DIAGNOSIS — E872 Acidosis: Secondary | ICD-10-CM | POA: Diagnosis present

## 2018-07-06 DIAGNOSIS — D638 Anemia in other chronic diseases classified elsewhere: Secondary | ICD-10-CM | POA: Diagnosis not present

## 2018-07-06 DIAGNOSIS — A419 Sepsis, unspecified organism: Secondary | ICD-10-CM | POA: Diagnosis not present

## 2018-07-06 DIAGNOSIS — N179 Acute kidney failure, unspecified: Secondary | ICD-10-CM | POA: Diagnosis present

## 2018-07-06 DIAGNOSIS — I110 Hypertensive heart disease with heart failure: Secondary | ICD-10-CM | POA: Diagnosis not present

## 2018-07-06 DIAGNOSIS — H16002 Unspecified corneal ulcer, left eye: Secondary | ICD-10-CM | POA: Diagnosis not present

## 2018-07-06 DIAGNOSIS — R238 Other skin changes: Secondary | ICD-10-CM | POA: Diagnosis not present

## 2018-07-06 DIAGNOSIS — L304 Erythema intertrigo: Secondary | ICD-10-CM | POA: Diagnosis not present

## 2018-07-06 DIAGNOSIS — E871 Hypo-osmolality and hyponatremia: Secondary | ICD-10-CM | POA: Diagnosis present

## 2018-07-06 DIAGNOSIS — L03311 Cellulitis of abdominal wall: Secondary | ICD-10-CM | POA: Diagnosis present

## 2018-07-06 DIAGNOSIS — Z6841 Body Mass Index (BMI) 40.0 and over, adult: Secondary | ICD-10-CM | POA: Diagnosis not present

## 2018-07-06 DIAGNOSIS — D62 Acute posthemorrhagic anemia: Secondary | ICD-10-CM | POA: Diagnosis present

## 2018-07-06 DIAGNOSIS — H179 Unspecified corneal scar and opacity: Secondary | ICD-10-CM | POA: Diagnosis not present

## 2018-07-06 DIAGNOSIS — H16072 Perforated corneal ulcer, left eye: Secondary | ICD-10-CM | POA: Diagnosis not present

## 2018-07-06 DIAGNOSIS — E870 Hyperosmolality and hypernatremia: Secondary | ICD-10-CM | POA: Diagnosis not present

## 2018-07-06 DIAGNOSIS — L089 Local infection of the skin and subcutaneous tissue, unspecified: Secondary | ICD-10-CM | POA: Diagnosis not present

## 2018-07-06 DIAGNOSIS — R52 Pain, unspecified: Secondary | ICD-10-CM | POA: Diagnosis not present

## 2018-07-06 DIAGNOSIS — H169 Unspecified keratitis: Secondary | ICD-10-CM | POA: Diagnosis not present

## 2018-07-06 LAB — BLOOD CULTURE ID PANEL (REFLEXED)
Acinetobacter baumannii: NOT DETECTED
CANDIDA ALBICANS: NOT DETECTED
CANDIDA GLABRATA: NOT DETECTED
CANDIDA TROPICALIS: NOT DETECTED
Candida krusei: NOT DETECTED
Candida parapsilosis: NOT DETECTED
ENTEROCOCCUS SPECIES: NOT DETECTED
Enterobacter cloacae complex: NOT DETECTED
Enterobacteriaceae species: NOT DETECTED
Escherichia coli: NOT DETECTED
Haemophilus influenzae: NOT DETECTED
KLEBSIELLA PNEUMONIAE: NOT DETECTED
Klebsiella oxytoca: NOT DETECTED
LISTERIA MONOCYTOGENES: NOT DETECTED
Methicillin resistance: DETECTED — AB
Neisseria meningitidis: NOT DETECTED
PROTEUS SPECIES: NOT DETECTED
Pseudomonas aeruginosa: NOT DETECTED
SERRATIA MARCESCENS: NOT DETECTED
STREPTOCOCCUS AGALACTIAE: NOT DETECTED
STREPTOCOCCUS PYOGENES: NOT DETECTED
STREPTOCOCCUS SPECIES: NOT DETECTED
Staphylococcus aureus (BCID): NOT DETECTED
Staphylococcus species: DETECTED — AB
Streptococcus pneumoniae: NOT DETECTED

## 2018-07-06 LAB — BASIC METABOLIC PANEL
Anion gap: 11 (ref 5–15)
BUN: 9 mg/dL (ref 6–20)
CO2: 17 mmol/L — ABNORMAL LOW (ref 22–32)
CREATININE: 0.93 mg/dL (ref 0.44–1.00)
Calcium: 8.6 mg/dL — ABNORMAL LOW (ref 8.9–10.3)
Chloride: 110 mmol/L (ref 98–111)
GFR calc Af Amer: >60 mL/min (ref >60)
Glucose, Bld: 100 mg/dL — ABNORMAL HIGH (ref 70–99)
Potassium: 4.5 mmol/L (ref 3.5–5.1)
SODIUM: 138 mmol/L (ref 135–145)

## 2018-07-06 LAB — URINE CULTURE: CULTURE: NO GROWTH

## 2018-07-06 MED ORDER — PREMIER PROTEIN SHAKE: 11.0000 [oz_av] | ORAL | Status: DC

## 2018-07-06 MED ORDER — OXYCODONE-ACETAMINOPHEN 5-325 MG PO TABS
2.0000 | ORAL_TABLET | Freq: Once | ORAL | Status: AC
  Administered 2018-07-06: 2 via ORAL
  Filled 2018-07-06: qty 2

## 2018-07-06 MED ORDER — VANCOMYCIN HCL 10 G IV SOLR
2500.0000 mg | Freq: Once | INTRAVENOUS | Status: AC
  Administered 2018-07-06: 2500 mg via INTRAVENOUS
  Filled 2018-07-06: qty 2000

## 2018-07-06 MED ORDER — MORPHINE SULFATE (PF) 4 MG/ML IV SOLN: 4.0000 mg | INTRAVENOUS | Status: DC | PRN

## 2018-07-06 MED ORDER — ENSURE ENLIVE PO LIQD
237.0000 mL | ORAL | Status: DC
  Administered 2018-07-07 – 2018-07-14 (×6): 237 mL via ORAL

## 2018-07-06 MED ORDER — JUVEN PO PACK
1.0000 | PACK | Freq: Two times a day (BID) | ORAL | Status: DC
  Administered 2018-07-07 – 2018-07-09 (×4): 1 via ORAL
  Filled 2018-07-06 (×17): qty 1

## 2018-07-06 MED ORDER — SODIUM CHLORIDE 0.9 % IV SOLN
100.0000 mg | Freq: Two times a day (BID) | INTRAVENOUS | Status: DC
  Filled 2018-07-06: qty 100

## 2018-07-06 MED ORDER — SODIUM CHLORIDE 0.9 % IV SOLN
2.0000 g | INTRAVENOUS | Status: DC
  Administered 2018-07-06: 2 g via INTRAVENOUS
  Filled 2018-07-06: qty 20
  Filled 2018-07-06: qty 2

## 2018-07-06 MED ORDER — PRO-STAT SUGAR FREE PO LIQD
30.0000 mL | Freq: Every day | ORAL | Status: DC
  Administered 2018-07-07 – 2018-07-08 (×2): 30 mL via ORAL
  Filled 2018-07-06 (×4): qty 30

## 2018-07-06 MED ORDER — ADULT MULTIVITAMIN W/MINERALS CH
1.0000 | ORAL_TABLET | Freq: Every day | ORAL | Status: DC
  Administered 2018-07-06 – 2018-08-10 (×11): 1 via ORAL
  Filled 2018-07-06 (×35): qty 1

## 2018-07-06 MED ORDER — VANCOMYCIN HCL 10 G IV SOLR
1500.0000 mg | INTRAVENOUS | Status: DC
  Filled 2018-07-06: qty 1500

## 2018-07-06 NOTE — Progress Notes (Signed)
PHARMACY - PHYSICIAN COMMUNICATION CRITICAL VALUE ALERT - BLOOD CULTURE IDENTIFICATION (BCID)  [name] is an 37 y.o. female who presented to [facility] on 07/04/2018 with a chief complaint of N/V  Assessment:  Patient with 1 out of 4 bottles with Staphylococcus species and MecA resistance noted.   (include suspected source if known)  Name of physician (or Provider) Contacted: Paged MD ~[phone], no return call yet.  Current antibiotics: None  Changes to prescribed antibiotics recommended:  Pharmacy recommends based on Rapid ID algorithm, to await final culture results and consider redrawing new culture sets.  The is most likely contamination based on results.  Results for orders placed or performed during the hospital encounter of 07/04/18  Blood Culture ID Panel (Reflexed) (Collected: 07/04/2018  8:07 PM)  Result Value Ref Range   Enterococcus species NOT DETECTED NOT DETECTED   Listeria monocytogenes NOT DETECTED NOT DETECTED   Staphylococcus species DETECTED (A) NOT DETECTED   Staphylococcus aureus (BCID) NOT DETECTED NOT DETECTED   Methicillin resistance DETECTED (A) NOT DETECTED   Streptococcus species NOT DETECTED NOT DETECTED   Streptococcus agalactiae NOT DETECTED NOT DETECTED   Streptococcus pneumoniae NOT DETECTED NOT DETECTED   Streptococcus pyogenes NOT DETECTED NOT DETECTED   Acinetobacter baumannii NOT DETECTED NOT DETECTED   Enterobacteriaceae species NOT DETECTED NOT DETECTED   Enterobacter cloacae complex NOT DETECTED NOT DETECTED   Escherichia coli NOT DETECTED NOT DETECTED   Klebsiella oxytoca NOT DETECTED NOT DETECTED   Klebsiella pneumoniae NOT DETECTED NOT DETECTED   Proteus species NOT DETECTED NOT DETECTED   Serratia marcescens NOT DETECTED NOT DETECTED   Haemophilus influenzae NOT DETECTED NOT DETECTED   Neisseria meningitidis NOT DETECTED NOT DETECTED   Pseudomonas aeruginosa NOT DETECTED NOT DETECTED   Candida albicans NOT DETECTED NOT DETECTED   Candida glabrata NOT DETECTED NOT DETECTED   Candida krusei NOT DETECTED NOT DETECTED   Candida parapsilosis NOT DETECTED NOT DETECTED   Candida tropicalis NOT DETECTED NOT DETECTED    [name] 07/06/2018  6:01 AM

## 2018-07-06 NOTE — Consult Note (Signed)
[facility] Nurse wound consult note Patient well known to our department; has been seen periodically over the past several years. Reason for Consult: Numerous full thickness lesions in the intertriginous areas, specifically the skin folds of the inframammary, abdominal pannus, mid-back, bilateral inguinal and intragluteal areas in an obese female with Crohn's Disease and an ileostomy.  Also, full thickness midline wound. Wound type: Presentation is similar to hydradenitis suppurativa and this could be considered as a possibility given that patient has Crohn's Disease and the two are commonly seen concomitantly. Sees GI physician at [facility] (Dr. Erven Colla) for this/these problems.  Pressure Injury POA: NA Measurement: Midline wound measures 16cm x 3cm x 0.4cm and presents with a slick, non granulating wound bed indicative of bacterial overgrowth. Numerous, linear full thickness wounds in the intertriginous areas: Note: the subpannicular wound is a continuous break in the integumentary system measuring 3cm to near 70cm x 0.2cm with red, smooth wound bed and large amounts of exudate. Wounds in the bilateral inguinal reach down to the labial skin folds and dressings become soiled when patient voids at home.  She has an indwelling urinary catheter.  Inframammary wounds L>R, left continues to the mid-back. The intragluteal wound measures 17cm x 2cm x 0.2cm Wound bed: As noted above.  No wounds are granulating, all have macerated periphery and large amounts of exudate. Drainage (amount, consistency, odor) Large amounts of light yellow exudate, malodorous due to entrapment in large skin folds and decreased frequency of dressing changes, inadequate cleansing. Periwound: macerated Dressing procedure/placement/frequency: I will provide a bariatric sleep surface with low air loss feature.  Patient uses [facility] for [facility] services.  Topical wound care orders are for silver hydrofiber (Aquacel Ag+) to wound beds, topped  with silver impregnated wicking textile (InterDry Ag+) for excess drainage removal via wicking.  Recommend referral upon discharge to [facility] for reevaluation and exploration of any additional options for this patient.   [facility] Nurse ostomy consult note Stoma type/location: RUQ ileostomy Stomal assessment/size: 1 and 1/4 x 1 and 3/8 inch oval, red, moist Peristomal assessment: Not seen today, viewed through intact pouch  Treatment options for stomal/peristomal skin: skin barrier ring Output: brown/green seedy stool Ostomy pouching: 1pc.flat ostomy pouching system. Patient uses ConvaTec flat pouching system at home; understands that we will provide [place] pouches while here in house. They are not her preference, but they "workCatering manager provided: none today. Reminded to call nursing staff when it is time to empty for assistance. Enrolled patient in [place] program: No. Patient is established with a downstream provided for ostomy supplies.   [place] nursing team will not follow, but will remain available to this patient, the nursing and medical teams.  Please re-consult if needed. Thanks, Maudie Flakes, MSN, RN, [place], Arther Abbott  Pager# [phone]

## 2018-07-06 NOTE — Progress Notes (Signed)
PT Cancellation Note  Patient Details Name: Dominique [name] MRN: [id number] DOB: [birth year]   Cancelled Treatment:    Reason Eval/Treat Not Completed: Fatigue/lethargy limiting ability to participate Pt reports she is still having pain today and did not rest well last night.  Pt politely declines to participate at this time.   Mayola Mcbain,KATHrine E 07/06/2018, 11:09 AM Carmelia Bake, PT, DPT Acute Rehabilitation Services Office: [phone] Pager: [phone]

## 2018-07-06 NOTE — Progress Notes (Addendum)
Initial Nutrition Assessment  DOCUMENTATION CODES:   Non-severe (moderate) malnutrition in context of chronic illness, Morbid obesity  INTERVENTION:  - Will order 30 mL Prostat once/day, this supplement provides 100 kcal and 15 grams of protein. - Will order Juven BID, each packet provides 80 calories and 14 grams of amino acids; supplement contains CaHMB, glutamine, and arginine, to promote wound healing. - Will order Ensure Enlive once/day, this supplement provides 350 kcal and 20 grams of protein.  - Will order daily multivitamin with minerals. - Continue to encourage PO intakes.    NUTRITION DIAGNOSIS:   Moderate Malnutrition related to chronic illness as evidenced by energy intake < or equal to 75% for > or equal to 1 month, percent weight loss.  GOAL:   Patient will meet greater than or equal to 90% of their needs  MONITOR:   PO intake, Supplement acceptance, Weight trends, Labs, Skin  REASON FOR ASSESSMENT:   Malnutrition Screening Tool  ASSESSMENT:   37 y.o. female past medical history significant for obesity, OSA, Crohn's disease s/p perforation with colostomy, chronic wound. She was in her usual health state of health until she developed recurrent nausea and vomiting several days ago, she is not lightheaded. Patient admitted with diagnosis of hypovolemia.   Per chart review, patient consumed 0% of lunch and 75% of dinner yesterday. She reports not eating breakfast and ate about 50% of lunch which was pot roast and mashed potatoes with gravy. Patient states that she experienced nausea with only 1 episode of emesis on 10/12, at which time she decided to come to the hospital. She reports 2 episodes of emesis since arriving to [facility], but has had no episodes today. She reports nausea is improving, no abdominal pain now or PTA. Nausea did not increase after lunch meal.  Patient reports decreased appetite for several months. Her brother or sister cook her meals and she typically  will skip breakfast, might eat a little bit of lunch, and then makes sure she eats well for dinner meal.   Patient reports having a bad headache. Will obtain further nutrition-related information at follow-up. Alerted RN that patient asking for Tylenol for headache.   NFPE outlined below. Per chart review, patient currently weighs 307 lb and recorded as weighing 357 lb on 9/7. This indicates 50 lb weight loss (14% body weight) in 1 month. This is significant for time frame. Noted dx of hypovolemia so unable to determine how much of this weight is d/t this. Will monitor closely.  WOC RN saw patient today and note from this afternoon states many full thickness wounds.    Medications reviewed; 650 mg oral sodium bicarb TID. Labs reviewed; Ca: 8.6 mg/dL.      NUTRITION - FOCUSED PHYSICAL EXAM:  Completed; no muscle and no fat wasting noted, but muscle wasting may be masked by fat stores.   Diet Order:   Diet Order            Diet Heart Room service appropriate? Yes; Fluid consistency: Thin  Diet effective now              EDUCATION NEEDS:   Not appropriate for education at this time  Skin:  Skin Assessment: Skin Integrity Issues: Skin Integrity Issues:: Other (Comment) Other: per flowsheet, non-pressure wounds to: abdomen, upper and lower abdomen, L breast, bilateral groins, sacrum, and bilateral lower back.  Last BM:  10/14: 125 ml via colostomy  Height:   Ht Readings from Last 1 Encounters:  07/04/18 5'  2" (1.575 m)    Weight:   Wt Readings from Last 1 Encounters:  07/06/18 (!) 139.5 kg    Ideal Body Weight:  50 kg  BMI:  Body mass index is 56.25 kg/m.  Estimated Nutritional Needs:   Kcal:  2230-2510 (16-18 kcal/kg)  Protein:  135-145 grams  Fluid:  >/= 2.3 L/day     Jarome Matin, MS, RD, LDN, [facility] Inpatient Clinical Dietitian Pager # [phone] After hours/weekend pager # [phone]

## 2018-07-06 NOTE — Progress Notes (Signed)
Pt refuses CPAP QHS, RT to monitor and assess as needed.

## 2018-07-06 NOTE — Progress Notes (Addendum)
Patient c/o groin pain 7/10 and requesting diluadid. Diluadid ordered d/c'd. She states that morphine give her a really bad HA. Paged K. Schorr. Percocet 5/325 2 tabs ordered and given. Continue to monitor.

## 2018-07-06 NOTE — Progress Notes (Signed)
[facility] PROGRESS NOTE    Progress Note  Dominique [name]  DZH:[id number] DOB: 05-10-1981 DOA: 07/04/2018 PCP: Benito Mccreedy, MD     Brief Narrative:   Dominique [name] is an 37 y.o. female past medical history significant for obesity obstructive sleep apnea Crohn's, status post perforation with colostomy chronic wounds and abdomen and bilateral groin, who was in his usual health until she developed recurrent nausea and vomiting several days ago, she is not lightheaded  Assessment/Plan:   Hypovolemia/orthostatic hypotension: She was started on IV fluids, blood cultures collected in the ED showed staph species methicillin-resistant, I will go ahead and start her on IV vancomycin and Rocephin On admission she is 137.6 kg, her previous weights have been ranging around 165 kg Need to gets daily standing weights continue IV fluid hydration. Discontinue telemetry. Orthostatics have not been checked. Physical therapy evaluation pending.  Sepsis due to cellulitis: Culture for MRSA, will start empirically on IV vancomycin. She has an allergy to penicillin but there is less than 5% cross-reactivity with cephalosporins.  Chronic wounds: We have consulted wound care, awaiting recommendations.  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 She relates she is having significant output through her ostomy.  Acute kidney injury: Likely prerenal in etiology improving with IV fluids.  Non-anion gap metabolic acidosis: With an anion gap of 10: Her renal function is at baseline, so likely significant amount of output through her ostomy  Hyponatremia: Likely hypovolemic, on admission 132 with IV fluids 134.  Obstructive sleep apnea continue CPAP.  Microcytic anemia: Seems to be stable.   DVT prophylaxis: lovenox Family Communication:none Disposition Plan/Barrier to D/C: home in am Code Status:     Code Status Orders  (From admission,  onward)         Start     Ordered   07/04/18 2335  Full code  Continuous     07/04/18 2337        Code Status History    Date Active Date Inactive Code Status Order ID Comments User Context   05/30/2018 1310 06/03/2018 1814 Full Code [id number]  Alma Friendly, MD Inpatient   04/19/2018 1247 04/23/2018 1912 Full Code [id number]  Debbe Odea, MD ED   01/06/2017 0612 01/07/2017 0325 Full Code [id number]  Norval Morton, MD ED   12/25/2016 1257 12/29/2016 1749 Full Code [id number]  Debbe Odea, MD Inpatient   12/25/2016 0910 12/25/2016 1257 Full Code [id number]  Debbe Odea, MD ED   07/21/2015 0935 07/25/2015 2218 Full Code [id number]  Samella Parr, NP Inpatient   06/26/2015 1759 07/21/2015 0935 Full Code [id number]  Cathlyn Parsons, PA-C Inpatient   06/04/2015 0757 06/26/2015 1759 Full Code [id number]  Gennaro Africa, MD Inpatient   11/16/2014 2238 11/23/2014 1747 Full Code [id number]  Berle Mull, MD ED        IV Access:    Peripheral IV   Procedures and diagnostic studies:   No results found.   Medical Consultants:    None.  Anti-Infectives:   None  Subjective:    [name] relates she is having pain around her groin area.  Objective:    Vitals:   07/05/18 0634 07/05/18 1250 07/05/18 2122 07/06/18 0504  BP:  134/75 116/66 118/69  Pulse: (!) 117 (!) 107 99 96  Resp:  18 20 17   Temp:  98.2 F (36.8 C) 98.1 F (36.7 C) 98.2 F (36.8 C)  TempSrc:  Oral Oral Oral  SpO2:  100%  100% 99%  Weight:    (!) 139.5 kg  Height:        Intake/Output Summary (Last 24 hours) at 07/06/2018 0750 Last data filed at 07/06/2018 0559 Gross per 24 hour  Intake 480 ml  Output 1600 ml  Net -1120 ml   Filed Weights   07/04/18 2339 07/05/18 0540 07/06/18 0504  Weight: (!) 137.6 kg (!) 137.8 kg (!) 139.5 kg    Exam: General exam: In no acute distress. Respiratory system: Good air movement and clear to auscultation. Cardiovascular system: S1 & S2 heard, RRR. No JVD,  murmurs, rubs, gallops or clicks.  Gastrointestinal system: Abdomen is nondistended, soft and nontender.  Central nervous system: Alert and oriented. No focal neurological deficits. Extremities: No pedal edema. Skin: Multiple macerated areas around her groin and folds.  Try to keep them dry Psychiatry: Judgement and insight appear normal. Mood & affect appropriate.    Data Reviewed:    Labs: Basic Metabolic Panel: Recent Labs  Lab 07/04/18 2055 07/05/18 0522 07/06/18 0608  NA 133* 134* 138  K 4.2 4.4 4.5  CL 107 109 110  CO2 16* 15* 17*  GLUCOSE 83 72 100*  BUN 14 13 9   CREATININE 1.16* 0.96 0.93  CALCIUM 8.4* 8.3* 8.6*   GFR Estimated Creatinine Clearance: 111.2 mL/min (by C-G formula based on SCr of 0.93 mg/dL). Liver Function Tests: Recent Labs  Lab 07/04/18 2055  AST 22  ALT 11  ALKPHOS 128*  BILITOT 0.6  PROT 7.3  ALBUMIN 2.4*   No results for input(s): LIPASE, AMYLASE in the last 168 hours. No results for input(s): AMMONIA in the last 168 hours. Coagulation profile No results for input(s): INR, PROTIME in the last 168 hours.  CBC: Recent Labs  Lab 07/04/18 2055 07/05/18 0522  WBC 9.7 11.8*  NEUTROABS 7.4 8.7*  HGB 9.4* 9.3*  HCT 32.6* 30.6*  MCV 77.8* 74.8*  PLT 448* 434*   Cardiac Enzymes: No results for input(s): CKTOTAL, CKMB, CKMBINDEX, TROPONINI in the last 168 hours. BNP (last 3 results) No results for input(s): PROBNP in the last 8760 hours. CBG: No results for input(s): GLUCAP in the last 168 hours. D-Dimer: No results for input(s): DDIMER in the last 72 hours. Hgb A1c: No results for input(s): HGBA1C in the last 72 hours. Lipid Profile: No results for input(s): CHOL, HDL, LDLCALC, TRIG, CHOLHDL, LDLDIRECT in the last 72 hours. Thyroid function studies: No results for input(s): TSH, T4TOTAL, T3FREE, THYROIDAB in the last 72 hours.  Invalid input(s): FREET3 Anemia work up: No results for input(s): VITAMINB12, FOLATE, FERRITIN,  TIBC, IRON, RETICCTPCT in the last 72 hours. Sepsis Labs: Recent Labs  Lab 07/04/18 2055 07/04/18 2214 07/05/18 0522  WBC 9.7  --  11.8*  LATICACIDVEN  --  1.08  --    Microbiology Recent Results (from the past 240 hour(s))  Blood culture (routine x 2)     Status: None (Preliminary result)   Collection Time: 07/04/18  8:07 PM  Result Value Ref Range Status   Specimen Description   Final    BLOOD RIGHT ANTECUBITAL Performed at [facility], [place] [address] [place], [place] [address]    Special Requests   Final    BOTTLES DRAWN AEROBIC AND ANAEROBIC Blood Culture results may not be optimal due to an excessive volume of blood received in culture bottles Performed at [place] [address], [place], [place] [address]    Culture  Setup Time  Final    GRAM POSITIVE COCCI AEROBIC BOTTLE ONLY Organism ID to follow CRITICAL RESULT CALLED TO, READ BACK BY AND VERIFIED WITH: J.GRIMSLEY,PHARMD AT 0508 ON 07/06/18 BY G.[email or web address] Performed at [facility], [place] [address] [place], [place] [address]    Culture PENDING  Incomplete   Report Status PENDING  Incomplete  Blood Culture ID Panel (Reflexed)     Status: Abnormal   Collection Time: 07/04/18  8:07 PM  Result Value Ref Range Status   Enterococcus species NOT DETECTED NOT DETECTED Final   Listeria monocytogenes NOT DETECTED NOT DETECTED Final   Staphylococcus species DETECTED (A) NOT DETECTED Final    Comment: CRITICAL RESULT CALLED TO, READ BACK BY AND VERIFIED WITH: J.GRIMSLEY,PHARMD AT 8938 ON 07/06/18 BY G.[email or web address]    Staphylococcus aureus (BCID) NOT DETECTED NOT DETECTED Final   Methicillin resistance DETECTED (A) NOT DETECTED Final    Comment: CRITICAL RESULT CALLED TO, READ BACK BY AND VERIFIED WITH: J.GRIMSLEY,PHARMD AT 0508 ON 07/06/18 BY G.MCADOO    Streptococcus species NOT DETECTED NOT DETECTED Final   Streptococcus agalactiae NOT DETECTED NOT DETECTED Final    Streptococcus pneumoniae NOT DETECTED NOT DETECTED Final   Streptococcus pyogenes NOT DETECTED NOT DETECTED Final   Acinetobacter baumannii NOT DETECTED NOT DETECTED Final   Enterobacteriaceae species NOT DETECTED NOT DETECTED Final   Enterobacter cloacae complex NOT DETECTED NOT DETECTED Final   Escherichia coli NOT DETECTED NOT DETECTED Final   Klebsiella oxytoca NOT DETECTED NOT DETECTED Final   Klebsiella pneumoniae NOT DETECTED NOT DETECTED Final   Proteus species NOT DETECTED NOT DETECTED Final   Serratia marcescens NOT DETECTED NOT DETECTED Final   Haemophilus influenzae NOT DETECTED NOT DETECTED Final   Neisseria meningitidis NOT DETECTED NOT DETECTED Final   Pseudomonas aeruginosa NOT DETECTED NOT DETECTED Final   Candida albicans NOT DETECTED NOT DETECTED Final   Candida glabrata NOT DETECTED NOT DETECTED Final   Candida krusei NOT DETECTED NOT DETECTED Final   Candida parapsilosis NOT DETECTED NOT DETECTED Final   Candida tropicalis NOT DETECTED NOT DETECTED Final    Comment: Performed at [facility], [place] [address], [place], [place] [address]     Medications:   . metoprolol tartrate  25 mg Oral BID  . sodium bicarbonate  650 mg Oral TID  . sodium chloride flush  3 mL Intravenous Q12H   Continuous Infusions:   LOS: 0 days   [facility] Pager [phone]  *Please refer to [email or web address], password [email or web address] to get updated schedule on who will round on this patient, as hospitalists switch teams weekly. If 7PM-7AM, please contact night-coverage at [email or web address], password [email or web address] for any overnight needs.  07/06/2018, 7:50 AM

## 2018-07-06 NOTE — Progress Notes (Signed)
Pharmacy Antibiotic Note  Dominique [name] is a 37 y.o. obese female with hx chronic wounds (abdomen, back, groin), presented to the ED on 07/04/2018 with lightheadedness.  1/4 blood cx bottles from 10/13 grew staph species.  To start vancomycin on 10/14 empirically for suspected wound infection and r/o bacteremia.   Plan: - vancomycin 2500 mg IV x1 bolus, then 1500 mg IV q24h (for est AUC 465) - monitor renal function - f/u cultures  ______________________________  Height: 5' 2"  (157.5 cm) Weight: (!) 307 lb 8.7 oz (139.5 kg) IBW/kg (Calculated) : 50.1  Temp (24hrs), Avg:98.2 F (36.8 C), Min:98.1 F (36.7 C), Max:98.2 F (36.8 C)  Recent Labs  Lab 07/04/18 2055 07/04/18 2214 07/05/18 0522 07/06/18 0608  WBC 9.7  --  11.8*  --   CREATININE 1.16*  --  0.96 0.93  LATICACIDVEN  --  1.08  --   --     Estimated Creatinine Clearance: 111.2 mL/min (by C-G formula based on SCr of 0.93 mg/dL).    Allergies  Allergen Reactions  . Other Shortness Of Breath and Swelling    Tree nuts  . Penicillins Other (See Comments)    Unknown childhood allergy Has patient had a PCN reaction causing immediate rash, facial/tongue/throat swelling, SOB or lightheadedness with hypotension: Unknown Has patient had a PCN reaction causing severe rash involving mucus membranes or skin necrosis: Unknown Has patient had a PCN reaction that required hospitalization: Unknown Has patient had a PCN reaction occurring within the last 10 years: Unknown If all of the above answers are "NO", then may proceed with Cephalosporin use.    Antimicrobials this admission:  10/14 vanc>>  Dose adjustments this admission:  --  Microbiology results:  10/13 BCx x2: 1/4 bottles GPC (BCID = staph species, MR detected) 10/13 UCx:   Thank you for allowing pharmacy to be a part of this patient's care.  Lynelle Doctor 07/06/2018 10:06 AM

## 2018-07-07 DIAGNOSIS — E440 Moderate protein-calorie malnutrition: Secondary | ICD-10-CM

## 2018-07-07 LAB — BASIC METABOLIC PANEL
Anion gap: 3 — ABNORMAL LOW (ref 5–15)
BUN: 10 mg/dL (ref 6–20)
CALCIUM: 8.3 mg/dL — AB (ref 8.9–10.3)
CO2: 23 mmol/L (ref 22–32)
Chloride: 112 mmol/L — ABNORMAL HIGH (ref 98–111)
Creatinine, Ser: 1.06 mg/dL — ABNORMAL HIGH (ref 0.44–1.00)
GFR calc Af Amer: >60 mL/min (ref >60)
GFR calc non Af Amer: >60 mL/min (ref >60)
GLUCOSE: 111 mg/dL — AB (ref 70–99)
POTASSIUM: 4.9 mmol/L (ref 3.5–5.1)
Sodium: 138 mmol/L (ref 135–145)

## 2018-07-07 LAB — CULTURE, BLOOD (ROUTINE X 2)

## 2018-07-07 MED ORDER — SULFAMETHOXAZOLE-TRIMETHOPRIM 800-160 MG PO TABS
2.0000 | ORAL_TABLET | Freq: Two times a day (BID) | ORAL | Status: DC
  Administered 2018-07-07 – 2018-07-13 (×14): 2 via ORAL
  Filled 2018-07-07 (×14): qty 2

## 2018-07-07 MED ORDER — HYDROMORPHONE HCL 1 MG/ML IJ SOLN
0.5000 mg | Freq: Once | INTRAMUSCULAR | Status: AC
  Administered 2018-07-07: 0.5 mg via INTRAVENOUS
  Filled 2018-07-07: qty 0.5

## 2018-07-07 NOTE — Evaluation (Signed)
Occupational Therapy Evaluation Patient Details Name: Dominique [name] MRN: [id number] DOB: 1981-05-29 Today's Date: 07/07/2018    History of Present Illness 37 y.o. 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 Dx of hypovolemia, sepsis, cellulitis   Clinical Impression   Pt was admitted for the above.  Pt reports that she was mostly mod I at home, as described below, and had assistance for dressing when she went outside of the house.  Pt reports she got dizzy (lightheaded) about a week ago and needed more assistance. She normally doesn't spend much time out of bed due to wounds.  Will follow in acute setting with the goals listed below to get near to PLOF.  May need SNF for rehab prior to home as her assistance is limited    Follow Up Recommendations  SNF;Supervision - Intermittent(vs home if dizziness/resolves and bed mobility improves)    Equipment Recommendations  3 in 1 bedside commode(wide)    Recommendations for Other Services       Precautions / Restrictions Precautions Precautions: Fall Precaution Comments: pt denies h/o falls in past 1 year Restrictions Weight Bearing Restrictions: No      Mobility Bed Mobility Overal bed mobility: Needs Assistance         Sit to supine: Min assist;Mod assist   General bed mobility comments: assist for bil LEs  Transfers Overall transfer level: Needs assistance Equipment used: Rolling walker (2 wheeled) Transfers: Sit to/from Stand Sit to Stand: Min guard;Min assist         General transfer comment: extra time from chair    Balance Overall balance assessment: Needs assistance   Sitting balance-Leahy Scale: Good     Standing balance support: Bilateral upper extremity supported Standing balance-Leahy  Scale: Poor                             ADL either performed or assessed with clinical judgement   ADL Overall ADL's : Needs assistance/impaired     Grooming: Set up;Sitting   Upper Body Bathing: Set up;Sitting   Lower Body Bathing: Moderate assistance;Sit to/from stand   Upper Body Dressing : Set up;Sitting   Lower Body Dressing: (set up shoes; total A underwear)   Toilet Transfer: Min guard;Ambulation;RW(around bed from chair)   Toileting- Clothing Manipulation and Hygiene: Maximal assistance;Sit to/from stand         General ADL Comments: pt wears skirts and slip on shoes at home. She can perform this with set up.  She has assistance for underwear when she goes out.  Has wounds and doesn't want to mess her underwear up     Vision         Perception     Praxis      Pertinent Vitals/Pain Pain Assessment: 0-10 Pain Score: 6  Pain Location: perineal area/lower abdomen Pain Descriptors / Indicators: Sore Pain Intervention(s): Limited activity within patient's tolerance;Monitored during session;Repositioned     Hand Dominance     Extremity/Trunk Assessment Upper Extremity Assessment Upper Extremity Assessment: Generalized weakness   Lower Extremity Assessment Lower Extremity Assessment: Overall WFL for tasks assessed(sensation intact to light touch, B knee ext +4/5)   Cervical / Trunk Assessment Cervical / Trunk Assessment: Normal   Communication Communication Communication: No difficulties   Cognition Arousal/Alertness:  Awake/alert Behavior During Therapy: WFL for tasks assessed/performed Overall Cognitive Status: Within Functional Limits for tasks assessed                                     General Comments  Pt was dizzy when walking around bed; she did not want to get back to bed on closer side as she said far side was easier. She had stood while RN changed dressing and she did not want to sit and rest again.  Did not get  BP:  had dropped 19 points with PT earlier    Exercises     Shoulder Instructions      Home Living Family/patient expects to be discharged to:: Private residence Living Arrangements: Parent Available Help at Discharge: Family;Available PRN/intermittently Type of Home: Apartment Home Access: Stairs to enter Entrance Stairs-Number of Steps: 4 Entrance Stairs-Rails: Can reach both Home Layout: One level     Bathroom Shower/Tub: Teacher, early years/pre: Standard     Home Equipment: None   Additional Comments: lives with father who is on dialysis.  7 y o daughter also helps her      Prior Functioning/Environment Level of Independence: Independent        Comments: has needed increased assistance over past week due to dizziness/pain from wounds.  Before this, she was independent. Pt wears skirts and slip on shoes only. Has help for depends if she is going out.  (she has wounds so doesn't wear pants/underwear at home.  Has a reacher at home        OT Problem List: Decreased strength;Decreased activity tolerance;Pain;Impaired balance (sitting and/or standing);Decreased knowledge of use of DME or AE      OT Treatment/Interventions: Self-care/ADL training;DME and/or AE instruction;Patient/family education;Balance training;Therapeutic activities    OT Goals(Current goals can be found in the care plan section) Acute Rehab OT Goals Patient Stated Goal: likes to go shopping with her sister OT Goal Formulation: With patient Time For Goal Achievement: 07/21/18 Potential to Achieve Goals: Good ADL Goals Pt Will Transfer to Toilet: with modified independence;regular height toilet;bedside commode;ambulating Pt Will Perform Toileting - Clothing Manipulation and hygiene: with min assist;sit to/from stand Additional ADL Goal #1: pt will get back into bed at supervision level with AE as needed  OT Frequency: Min 2X/week   Barriers to D/C:            Co-evaluation               AM-PAC PT "6 Clicks" Daily Activity     Outcome Measure Help from another person eating meals?: None Help from another person taking care of personal grooming?: A Little Help from another person toileting, which includes using toliet, bedpan, or urinal?: A Lot Help from another person bathing (including washing, rinsing, drying)?: A Lot Help from another person to put on and taking off regular upper body clothing?: A Little Help from another person to put on and taking off regular lower body clothing?: A Lot 6 Click Score: 16   End of Session    Activity Tolerance: Patient limited by fatigue;Patient limited by pain Patient left: in bed;with call bell/phone within reach  OT Visit Diagnosis: Unsteadiness on feet (R26.81)                Time: 7673-4193 OT Time Calculation (min): 30 min Charges:  OT General Charges $OT Visit: 1 Visit OT Evaluation $  OT Eval Low Complexity: 1 Low OT Treatments $Therapeutic Activity: 8-22 mins  [name], OTR/L Acute Rehabilitation Services [phone] [facility] pager [phone] office 07/07/2018  Hjalmer Iovino 07/07/2018, 3:53 PM

## 2018-07-07 NOTE — Progress Notes (Addendum)
[facility] PROGRESS NOTE    Progress Note  Dominique [name]  YHC:[id number] DOB: Jan 07, 1981 DOA: 07/04/2018 PCP: Benito Mccreedy, MD     Brief Narrative:   Dominique [name] is an 37 y.o. 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 Discontinue telemetry. Awaiting physical therapy evaluation pending.  Sepsis due to cellulitis: It seems sepsis was evolving during this admission. Wound care was consulted recommended silver hydrogel top with silver impregnated with wicking textile flexes drainage. I agree with WOUND CARE NURSE DOCUMENTATION. She was started empirically on oral Bactrim has remained afebrile. Awaiting physical therapy evaluation.  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 She relates she is having significant output through her ostomy.  Acute kidney injury: Likely prerenal in etiology improving with IV fluids.  Non-anion gap metabolic acidosis: With an anion gap of 10: Her renal function is at baseline, so likely significant amount of output through her ostomy  Hyponatremia: Likely hypovolemic, on admission 132 with IV fluids 134.  Obstructive sleep apnea continue CPAP.  Microcytic anemia: Seems to be stable.   DVT prophylaxis: lovenox Family Communication:none Disposition Plan/Barrier to D/C: Home in 1 to 2 days. Code  Status:     Code Status Orders  (From admission, onward)         Start     Ordered   07/04/18 2335  Full code  Continuous     07/04/18 2337        Code Status History    Date Active Date Inactive Code Status Order ID Comments User Context   05/30/2018 1310 06/03/2018 1814 Full Code [id number]  Alma Friendly, MD Inpatient   04/19/2018 1247 04/23/2018 1912 Full Code [id number]  Debbe Odea, MD ED   01/06/2017 0612 01/07/2017 0325 Full Code [id number]  Norval Morton, MD ED   12/25/2016 1257 12/29/2016 1749 Full Code [id number]  Debbe Odea, MD Inpatient   12/25/2016 0910 12/25/2016 1257 Full Code [id number]  Debbe Odea, MD ED   07/21/2015 0935 07/25/2015 2218 Full Code [id number]  Samella Parr, NP Inpatient   06/26/2015 1759 07/21/2015 0935 Full Code [id number]  Cathlyn Parsons, PA-C Inpatient   06/04/2015 0757 06/26/2015 1759 Full Code [id number]  Gennaro Africa, MD Inpatient   11/16/2014 2238 11/23/2014 1747 Full Code [id number]  Berle Mull, MD ED        IV Access:    Peripheral IV   Procedures and diagnostic studies:   No results found.   Medical Consultants:    None.  Anti-Infectives:   None  Subjective:    Dominique [name] relates she is having pain around her groin area where her wounds are.  Objective:    Vitals:   07/06/18 0504 07/06/18 1421 07/06/18 2025 07/07/18 0513  BP: 118/69 (!) 105/56 117/68 (!) 111/55  Pulse: 96 93 (!) 103 86  Resp: 17 18  18   Temp: 98.2 F (36.8 C) 98.8 F (37.1 C) 98.8 F (37.1  C) 97.9 F (36.6 C)  TempSrc: Oral Oral Oral Oral  SpO2: 99% 98% 100% 100%  Weight: (!) 139.5 kg     Height:        Intake/Output Summary (Last 24 hours) at 07/07/2018 0912 Last data filed at 07/07/2018 0526 Gross per 24 hour  Intake 243 ml  Output 900 ml  Net -657 ml   Filed Weights   07/04/18 2339 07/05/18 0540 07/06/18 0504  Weight: (!) 137.6 kg (!) 137.8 kg (!) 139.5 kg    Exam: General exam: In no acute distress. Respiratory  system: Good air movement and clear to auscultation. Cardiovascular system: S1 & S2 heard, RRR. No JVD, murmurs, rubs, gallops or clicks.  Gastrointestinal system: Abdomen is nondistended, soft and nontender.  Central nervous system: Alert and oriented. No focal neurological deficits. Extremities: No pedal edema. Skin: Multiple macerated areas around her groin and folds.  Try to keep them dry Psychiatry: Judgement and insight appear normal. Mood & affect appropriate.    Data Reviewed:    Labs: Basic Metabolic Panel: Recent Labs  Lab 07/04/18 2055 07/05/18 0522 07/06/18 0608 07/07/18 0611  NA 133* 134* 138 138  K 4.2 4.4 4.5 4.9  CL 107 109 110 112*  CO2 16* 15* 17* 23  GLUCOSE 83 72 100* 111*  BUN 14 13 9 10   CREATININE 1.16* 0.96 0.93 1.06*  CALCIUM 8.4* 8.3* 8.6* 8.3*   GFR Estimated Creatinine Clearance: 97.6 mL/min (A) (by C-G formula based on SCr of 1.06 mg/dL (H)). Liver Function Tests: Recent Labs  Lab 07/04/18 2055  AST 22  ALT 11  ALKPHOS 128*  BILITOT 0.6  PROT 7.3  ALBUMIN 2.4*   No results for input(s): LIPASE, AMYLASE in the last 168 hours. No results for input(s): AMMONIA in the last 168 hours. Coagulation profile No results for input(s): INR, PROTIME in the last 168 hours.  CBC: Recent Labs  Lab 07/04/18 2055 07/05/18 0522  WBC 9.7 11.8*  NEUTROABS 7.4 8.7*  HGB 9.4* 9.3*  HCT 32.6* 30.6*  MCV 77.8* 74.8*  PLT 448* 434*   Cardiac Enzymes: No results for input(s): CKTOTAL, CKMB, CKMBINDEX, TROPONINI in the last 168 hours. BNP (last 3 results) No results for input(s): PROBNP in the last 8760 hours. CBG: No results for input(s): GLUCAP in the last 168 hours. D-Dimer: No results for input(s): DDIMER in the last 72 hours. Hgb A1c: No results for input(s): HGBA1C in the last 72 hours. Lipid Profile: No results for input(s): CHOL, HDL, LDLCALC, TRIG, CHOLHDL, LDLDIRECT in the last 72 hours. Thyroid function studies: No results for  input(s): TSH, T4TOTAL, T3FREE, THYROIDAB in the last 72 hours.  Invalid input(s): FREET3 Anemia work up: No results for input(s): VITAMINB12, FOLATE, FERRITIN, TIBC, IRON, RETICCTPCT in the last 72 hours. Sepsis Labs: Recent Labs  Lab 07/04/18 2055 07/04/18 2214 07/05/18 0522  WBC 9.7  --  11.8*  LATICACIDVEN  --  1.08  --    Microbiology Recent Results (from the past 240 hour(s))  Blood culture (routine x 2)     Status: Abnormal (Preliminary result)   Collection Time: 07/04/18  8:07 PM  Result Value Ref Range Status   Specimen Description   Final    BLOOD RIGHT ANTECUBITAL Performed at [facility] [address], [place], [place] [address]    Special Requests   Final    BOTTLES DRAWN AEROBIC AND ANAEROBIC Blood Culture results may not be optimal due to an excessive volume of  blood received in culture bottles Performed at [facility], [place] [address], [place], [place] [address]    Culture  Setup Time   Final    GRAM POSITIVE COCCI AEROBIC BOTTLE ONLY CRITICAL RESULT CALLED TO, READ BACK BY AND VERIFIED WITH: J.GRIMSLEY,PHARMD AT 0508 ON 07/06/18 BY G.MCADOO Performed at [facility], [place] [address], [place], [place] [address]    Culture STAPHYLOCOCCUS SPECIES (COAGULASE NEGATIVE) (A)  Final   Report Status PENDING  Incomplete  Blood Culture ID Panel (Reflexed)     Status: Abnormal   Collection Time: 07/04/18  8:07 PM  Result Value Ref Range Status   Enterococcus species NOT DETECTED NOT DETECTED Final   Listeria monocytogenes NOT DETECTED NOT DETECTED Final   Staphylococcus species DETECTED (A) NOT DETECTED Final    Comment: CRITICAL RESULT CALLED TO, READ BACK BY AND VERIFIED WITH: J.GRIMSLEY,PHARMD AT 6222 ON 07/06/18 BY G.MCADOO    Staphylococcus aureus (BCID) NOT DETECTED NOT DETECTED Final   Methicillin resistance DETECTED (A) NOT DETECTED Final    Comment: CRITICAL RESULT CALLED TO, READ BACK BY AND VERIFIED  WITH: J.GRIMSLEY,PHARMD AT 0508 ON 07/06/18 BY G.MCADOO    Streptococcus species NOT DETECTED NOT DETECTED Final   Streptococcus agalactiae NOT DETECTED NOT DETECTED Final   Streptococcus pneumoniae NOT DETECTED NOT DETECTED Final   Streptococcus pyogenes NOT DETECTED NOT DETECTED Final   Acinetobacter baumannii NOT DETECTED NOT DETECTED Final   Enterobacteriaceae species NOT DETECTED NOT DETECTED Final   Enterobacter cloacae complex NOT DETECTED NOT DETECTED Final   Escherichia coli NOT DETECTED NOT DETECTED Final   Klebsiella oxytoca NOT DETECTED NOT DETECTED Final   Klebsiella pneumoniae NOT DETECTED NOT DETECTED Final   Proteus species NOT DETECTED NOT DETECTED Final   Serratia marcescens NOT DETECTED NOT DETECTED Final   Haemophilus influenzae NOT DETECTED NOT DETECTED Final   Neisseria meningitidis NOT DETECTED NOT DETECTED Final   Pseudomonas aeruginosa NOT DETECTED NOT DETECTED Final   Candida albicans NOT DETECTED NOT DETECTED Final   Candida glabrata NOT DETECTED NOT DETECTED Final   Candida krusei NOT DETECTED NOT DETECTED Final   Candida parapsilosis NOT DETECTED NOT DETECTED Final   Candida tropicalis NOT DETECTED NOT DETECTED Final    Comment: Performed at [facility], [place] [address], [place], [place] [address]  Urine culture     Status: None   Collection Time: 07/05/18  5:11 AM  Result Value Ref Range Status   Specimen Description   Final    URINE, CATHETERIZED Performed at [facility] [address] [place], [place] [address]    Special Requests   Final    NONE Performed at [facility], [place] [address], [place], [place] [address]    Culture   Final    NO GROWTH Performed at [facility], [place] [address], [place], [place] [address]    Report Status 07/06/2018 FINAL  Final     Medications:   . feeding supplement (ENSURE ENLIVE)  237 mL Oral Q24H  . feeding supplement (PRO-STAT SUGAR FREE 64)  30 mL Oral  Daily  . metoprolol tartrate  25 mg Oral BID  . multivitamin with minerals  1 tablet Oral Daily  . nutrition supplement (JUVEN)  1 packet Oral BID BM  . sodium bicarbonate  650 mg Oral TID  . sodium chloride flush  3 mL Intravenous Q12H   Continuous Infusions: . cefTRIAXone (ROCEPHIN)  IV 2 g (07/06/18 1127)  .  vancomycin      LOS: 1 day   Charlynne Cousins  [facility] Pager [phone]  *Please refer to [email or web address], password [email or web address] to get updated schedule on who will round on this patient, as hospitalists switch teams weekly. If 7PM-7AM, please contact night-coverage at [email or web address], password [email or web address] for any overnight needs.  07/07/2018, 9:12 AM

## 2018-07-07 NOTE — Progress Notes (Signed)
Pt refuses CPAP QHS, RT to monitor and assess as needed.

## 2018-07-07 NOTE — Progress Notes (Signed)
Patient now c/o pain 9-10/10. Paged K. Schorr. Continue to monitor.

## 2018-07-07 NOTE — Evaluation (Addendum)
Physical Therapy Evaluation Patient Details Name: Dominique [name] MRN: [id number] DOB: November 15, 1980 Today's Date: 07/07/2018   History of Present Illness  37 y.o. 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 Dx of hypovolemia, sepsis, cellulitis  Clinical Impression  Pt admitted with above diagnosis. Pt currently with functional limitations due to the deficits listed below (see PT Problem List). Pt ambulated 9' with RW with min assist for balance, distance limited by dizziness. See flowsheets for orthostatic BPs, pt had 19 point systolic drop with supine to sit). Pt reports poor vision, R eye "needs a cornea transplant and L eye is blurry". Poor vision may impact pt's ability to care well for her ostomy and wounds. OT order placed to assess pt's ability to perform ADLs. ST-SNF recommended. Pt will benefit from skilled PT to increase their independence and safety with mobility to allow discharge to the venue listed below.       Follow Up Recommendations SNF;Supervision for mobility/OOB (depending on progress -HHPT if dizziness improves and pt is able to manage ostomy and wound care)    Equipment Recommendations  Rolling walker with 5" wheels;3in1 (PT)(bariatric RW and 3 in 1)    Recommendations for Other Services       Precautions / Restrictions Precautions Precautions: Fall Precaution Comments: pt denies h/o falls in past 1 year Restrictions Weight Bearing Restrictions: No      Mobility  Bed Mobility Overal bed mobility: Modified Independent             General bed mobility comments: HOB up, increased time  Transfers Overall transfer level: Needs assistance Equipment used: Rolling walker (2 wheeled) Transfers: Sit to/from Stand Sit to Stand: Min guard;From  elevated surface         General transfer comment: increased time, VCs hand placement  Ambulation/Gait Ambulation/Gait assistance: Min assist;Min guard Gait Distance (Feet): 9 Feet Assistive device: Rolling walker (2 wheeled) Gait Pattern/deviations: Step-through pattern;Decreased stride length Gait velocity: decr   General Gait Details: distance limited by dizziness (pt orthostatic, see BPs in flowsheets), mild loss of balance requiring min assist when pt released one hand from [employer] Mobility    Modified Rankin (Stroke Patients Only)       Balance Overall balance assessment: Needs assistance   Sitting balance-Leahy Scale: Good     Standing balance support: Bilateral upper extremity supported Standing balance-Leahy Scale: Poor                               Pertinent Vitals/Pain Pain Assessment: 0-10 Pain Score: 6  Pain Location: perineal area/lower abdomen Pain Descriptors / Indicators: Sore Pain Intervention(s): Limited activity within patient's tolerance;Monitored during session;Patient requesting pain meds-RN notified    Home Living Family/patient expects to be discharged to:: Private residence Living Arrangements: Parent Available Help at Discharge: Family;Available PRN/intermittently Type of Home: Apartment Home Access: Stairs to enter Entrance Stairs-Rails: Can reach both Entrance Stairs-Number of Steps: 4 Home Layout: One level Home Equipment: None      Prior Function Level of Independence: Independent         Comments: lives with father who goes to dialysis 3x/week, and 14 y.o. daughter     Hand Dominance  Extremity/Trunk Assessment   Upper Extremity Assessment Upper Extremity Assessment: Defer to OT evaluation    Lower Extremity Assessment Lower Extremity Assessment: Overall WFL for tasks assessed(sensation intact to light touch, B knee ext +4/5)    Cervical / Trunk  Assessment Cervical / Trunk Assessment: Normal  Communication   Communication: No difficulties  Cognition Arousal/Alertness: Awake/alert Behavior During Therapy: WFL for tasks assessed/performed Overall Cognitive Status: Within Functional Limits for tasks assessed                                        General Comments      Exercises     Assessment/Plan    PT Assessment Patient needs continued PT services  PT Problem List Decreased activity tolerance;Decreased balance;Decreased mobility;Decreased skin integrity       PT Treatment Interventions Gait training;DME instruction;Functional mobility training;Therapeutic activities;Therapeutic exercise;Balance training;Patient/family education    PT Goals (Current goals can be found in the Care Plan section)  Acute Rehab PT Goals Patient Stated Goal: likes to go shopping with her sister PT Goal Formulation: With patient Time For Goal Achievement: 07/21/18 Potential to Achieve Goals: Good    Frequency Min 3X/week   Barriers to discharge        Co-evaluation               AM-PAC PT "6 Clicks" Daily Activity  Outcome Measure Difficulty turning over in bed (including adjusting bedclothes, sheets and blankets)?: A Little Difficulty moving from lying on back to sitting on the side of the bed? : A Little Difficulty sitting down on and standing up from a chair with arms (e.g., wheelchair, bedside commode, etc,.)?: A Little Help needed moving to and from a bed to chair (including a wheelchair)?: A Little Help needed walking in hospital room?: A Little Help needed climbing 3-5 steps with a railing? : A Lot 6 Click Score: 17    End of Session Equipment Utilized During Treatment: Gait belt Activity Tolerance: Treatment limited secondary to medical complications (Comment)(dizziness) Patient left: in chair;with call bell/phone within reach;with nursing/sitter in room Nurse Communication: Mobility status PT Visit  Diagnosis: Difficulty in walking, not elsewhere classified (R26.2);Dizziness and giddiness (R42)    Time: 1031-5945 PT Time Calculation (min) (ACUTE ONLY): 40 min   Charges:   PT Evaluation $PT Eval Low Complexity: 1 Low PT Treatments $Gait Training: 8-22 mins $Therapeutic Activity: 8-22 mins        Blondell Reveal Kistler PT 07/07/2018  Acute Rehabilitation Services Pager [phone] Office [phone]

## 2018-07-07 NOTE — Care Management Note (Signed)
Case Management Note  Patient Details  Name: Hollace Michelli MRN: [id number] Date of Birth: 1981/06/01  Subjective/Objective: PT recc SNF.Informed patient to continue to work w/PT on progress. Informed patient that she has a medicaid case worker if d/c plan is home-her meds on the medicaid preferred list will be covered-if concerns she should contact the medicaid case worker.She has support @ home. CSW notified of PT recc SNF.                   Action/Plan:d/c plan SNF.   Expected Discharge Date:                  Expected Discharge Plan:  Skilled Nursing Facility  In-House Referral:  Clinical Social Work  Discharge planning Services  CM Consult  Post Acute Care Choice:    Choice offered to:     DME Arranged:    DME Agency:     HH Arranged:    [place] Agency:     Status of Service:  In process, will continue to follow  If discussed at Long Length of Stay Meetings, dates discussed:    Additional Comments:  Dessa Phi, RN 07/07/2018, 2:56 PM

## 2018-07-08 DIAGNOSIS — G4733 Obstructive sleep apnea (adult) (pediatric): Secondary | ICD-10-CM

## 2018-07-08 DIAGNOSIS — E860 Dehydration: Secondary | ICD-10-CM

## 2018-07-08 DIAGNOSIS — Z9989 Dependence on other enabling machines and devices: Secondary | ICD-10-CM

## 2018-07-08 DIAGNOSIS — N179 Acute kidney failure, unspecified: Secondary | ICD-10-CM

## 2018-07-08 DIAGNOSIS — R52 Pain, unspecified: Secondary | ICD-10-CM

## 2018-07-08 LAB — CBC
HCT: 29.4 % — ABNORMAL LOW (ref 36.0–46.0)
HEMOGLOBIN: 8.5 g/dL — AB (ref 12.0–15.0)
MCH: 22.7 pg — ABNORMAL LOW (ref 26.0–34.0)
MCHC: 28.9 g/dL — AB (ref 30.0–36.0)
MCV: 78.6 fL — ABNORMAL LOW (ref 80.0–100.0)
PLATELETS: 453 10*3/uL — AB (ref 150–400)
RBC: 3.74 MIL/uL — AB (ref 3.87–5.11)
RDW: 23.2 % — ABNORMAL HIGH (ref 11.5–15.5)
WBC: 8.8 10*3/uL (ref 4.0–10.5)
nRBC: 0 % (ref 0.0–0.2)

## 2018-07-08 LAB — BASIC METABOLIC PANEL
Anion gap: 6 (ref 5–15)
BUN: 10 mg/dL (ref 6–20)
CHLORIDE: 110 mmol/L (ref 98–111)
CO2: 23 mmol/L (ref 22–32)
CREATININE: 0.91 mg/dL (ref 0.44–1.00)
Calcium: 8.2 mg/dL — ABNORMAL LOW (ref 8.9–10.3)
Glucose, Bld: 107 mg/dL — ABNORMAL HIGH (ref 70–99)
Potassium: 4 mmol/L (ref 3.5–5.1)
Sodium: 139 mmol/L (ref 135–145)

## 2018-07-08 MED ORDER — OXYCODONE HCL 5 MG PO TABS
5.0000 mg | ORAL_TABLET | ORAL | Status: DC | PRN
  Administered 2018-07-08 – 2018-07-20 (×2): 5 mg via ORAL
  Filled 2018-07-08 (×7): qty 1

## 2018-07-08 MED ORDER — HYDROMORPHONE HCL 1 MG/ML IJ SOLN
1.0000 mg | INTRAMUSCULAR | Status: DC | PRN
  Administered 2018-07-09 – 2018-07-15 (×13): 1 mg via INTRAVENOUS
  Filled 2018-07-08 (×13): qty 1

## 2018-07-08 MED ORDER — OXYCODONE HCL 5 MG PO TABS
10.0000 mg | ORAL_TABLET | ORAL | Status: DC | PRN
  Administered 2018-07-08 – 2018-07-27 (×60): 10 mg via ORAL
  Filled 2018-07-08 (×63): qty 2

## 2018-07-08 MED ORDER — SODIUM BICARBONATE 650 MG PO TABS
650.0000 mg | ORAL_TABLET | Freq: Every day | ORAL | Status: DC
  Administered 2018-07-08 – 2018-07-14 (×7): 650 mg via ORAL
  Filled 2018-07-08 (×6): qty 1

## 2018-07-08 MED ORDER — SODIUM CHLORIDE 0.9 % IV SOLN
INTRAVENOUS | Status: DC
  Administered 2018-07-08 – 2018-07-09 (×3): via INTRAVENOUS

## 2018-07-08 NOTE — Clinical Social Work Note (Signed)
Clinical Social Work Assessment  Patient Details  Name: Dominique [name] MRN: [id number] Date of Birth: 05/29/1981  Date of referral:  07/08/18               Reason for consult:  Facility Placement                Permission sought to share information with:  Facility Art therapist granted to share information::  Yes, Verbal Permission Granted  Name::        Agency::     Relationship::     Contact Information:     Housing/Transportation Living arrangements for the past 2 months:  Apartment Source of Information:  Patient Patient Interpreter Needed:  None Criminal Activity/Legal Involvement Pertinent to Current Situation/Hospitalization:  No - Comment as needed Significant Relationships:  Other(Comment)(unknown) Lives with:  Self Do you feel safe going back to the place where you live?  (PT recommending SNF) Need for family participation in patient care:  No (Coment)  Care giving concerns:  Patient from home alone. Patient reported that she was independent with ambulation and ADLs prior to hospitalization. Patient has a " past medical history significant for obesity obstructive sleep apnea Crohn's, status post perforation with colostomy chronic wounds and abdomen and bilateral groin, who was in his usual health until she developed recurrent nausea and vomiting several days ago. Dx of hypovolemia, sepsis, cellulitis". PT recommending SNF.   Social Worker assessment / plan:  CSW spoke with patient at bedside regarding PT recommendation for SNF and discharge planning. Patient reported that she has been to SNF in the past and is agreeable to going after this hospital admission. CSW explained SNF placement process and medicaid coverage, patient verbalized understanding. Patient reported that she wants to go to [facility]. CSW agreed to complete patient's FL2 and follow up with [facility] SNF.  CSW completed patient's FL2 and followed up with staff member Adela Lank who agreed  to review patient's referral.  CSW will continue to follow and assist with discharge planning.   Employment status:  Unemployed Forensic scientist:  Medicaid In [place] PT Recommendations:  [place] / Referral to community resources:  [place]  Patient/Family's Response to care:  Patient appreciative of CSW assistance with discharge planning.  Patient/Family's Understanding of and Emotional Response to Diagnosis, Current Treatment, and Prognosis:  Patient presented calm and verbalized understanding of PT recommendation for SNF. Patient verbalized plan to discharge to SNF when medically stable.  Emotional Assessment Appearance:  Appears stated age Attitude/Demeanor/Rapport:  Other(Cooperative) Affect (typically observed):  Appropriate, Calm Orientation:  Oriented to Self, Oriented to Situation, Oriented to Place, Oriented to  Time Alcohol / Substance use:  Not Applicable Psych involvement (Current and /or in the community):  No (Comment)  Discharge Needs  Concerns to be addressed:  Care Coordination Readmission within the last 30 days:  Yes Current discharge risk:  Physical Impairment Barriers to Discharge:  Continued Medical Work up   [employer], LCSW 07/08/2018, 2:32 PM

## 2018-07-08 NOTE — Progress Notes (Signed)
CSW contacted by [facility] staff member Adela Lank and informed that they are unable to offer patient a bed due outstanding bill.  CSW updated patient, patient agreeable to CSW sending patient's referral to other local SNFs.   CSW will send patient's referral to other SNFs and follow up with bed offers.  Dominique [name], [place] Social Worker [facility] Cell#: [phone]

## 2018-07-08 NOTE — NC FL2 (Signed)
[place] LEVEL OF CARE SCREENING TOOL     IDENTIFICATION  Patient Name: Dominique [name] Birthdate: [birth year] Sex: female Admission Date (Current Location): 07/04/2018  [facility] and [place] Number:  Herbalist and Address:  [facility],  [place] [address] [place]      Provider Number: [id number]  Attending Physician Name and Address:  Debbe Odea, MD  Relative Name and Phone Number:       Current Level of Care: Hospital Recommended Level of Care: [place] Prior Approval Number:    Date Approved/Denied:   PASRR Number: pending  Discharge Plan: SNF    Current Diagnoses: Patient Active Problem List   Diagnosis Date Noted  . Malnutrition of moderate degree 07/07/2018  . Hypovolemia 07/04/2018  . Hyponatremia 07/04/2018  . Coronary artery disease   . Cellulitis 05/30/2018  . Mild renal insufficiency 04/20/2018  . Abnormal TSH 04/20/2018  . Redness of both eyes 04/20/2018  . Eye pain, right 04/20/2018  . Multiple wounds of skin 04/19/2018  . 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 Colostomy in place [facility]) 01/06/2017  . 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 Infliximab (Remicade) Itzabella Sorrels-term use 01/06/2017  . Crohn's colitis, with abscess ([place]) 01/06/2017  . Abdominal abscess 12/25/2016  . Chronic anemia 12/25/2016  . Hypokalemia 07/21/2015  . Adjustment disorder with mixed anxiety and depressed mood   . Physical deconditioning 06/26/2015  . Severe left ventricular systolic dysfunction 69/62/9528  . Pulmonary hypertension ([place])   . Crohn's colitis with perforation s/p left colectomy/colostomy 2016   . OSA on CPAP   . Obesity hypoventilation syndrome ([place])   . Acute renal insufficiency   . Pressure ulcer 06/16/2015  . Protein-calorie malnutrition, severe ([place]) 06/10/2015  . Gastritis 04/07/2015  . Iron deficiency anemia 04/07/2015  . Sinus tachycardia ([place])  12/29/2014  . Lumbar back pain 12/06/2014  . Orthostatic hypotension 11/20/2014  . Rectal bleeding 11/19/2014  . Diverticulitis of colon 11/17/2014  . Hepatic steatosis 11/17/2014  . Seasonal allergies 06/13/2014  . Thyromegaly 05/02/2014  . AMENORRHEA 02/07/2010  . Morbid obesity- BMI 67.5 03/22/2009  . Sickle cell trait ([place]) 10/20/2007    Orientation RESPIRATION BLADDER Height & Weight     Self, Time, Situation, Place  Normal Indwelling catheter Weight: (Bed was not zeroed.  When patient gets up, please zero bed. ) Height:  5' 2"  (157.5 cm)  BEHAVIORAL SYMPTOMS/MOOD NEUROLOGICAL BOWEL NUTRITION STATUS      Ileostomy Diet(Heart Healthy)  AMBULATORY STATUS COMMUNICATION OF NEEDS Skin   Limited Assist Verbally Other (Comment)   Wound / Incision (Open or Dehisced) 07/04/18 Incision - Open Abdomen Medial;Upper 13.5 cm x 2cm x 0.5cm (Moist to Dry;Gauze, Changed Daily) Wound / Incision (Open or Dehisced) 04/22/18 Other (Comment) Abdomen Mid (Abdominal Pads;Gauze;Aquacel Changed Daily) Wound / Incision (Open or Dehisced) 05/30/18 Non-pressure wound Abdomen Mid update 05/30/18-multiple open, wounds & small breaks in skin (Alginate and Interdry) Wound / Incision (Open or Dehisced) 05/30/18 Non-pressure wound Groin Right;Left open, wound/pink/draining purulent fluid (moist to dry changed daily) Wound / Incision (Open or Dehisced) 05/30/18 Non-pressure wound Sacrum Mid intergluteal cleft open/pink/minimal drainage noted (moist to dry changed daily) Wound / Incision (Open or Dehisced) 05/30/18 Non-pressure wound Back Right;Left;Lower under folds of lower back, small broken areas (alginate and interdry) Incision (Closed) 06/11/15 Abdomen Other (Comment) (alginate and interdry)  Personal Care Assistance Level of Assistance  Bathing, Feeding, Dressing Bathing Assistance: Maximum assistance Feeding assistance: Independent Dressing Assistance: Maximum assistance      Functional Limitations Info  Sight, Hearing, Speech Sight Info: Impaired Hearing Info: Adequate Speech Info: Adequate    SPECIAL CARE FACTORS FREQUENCY  PT (By licensed PT)     PT Frequency: Min 3x/week              Contractures      Additional Factors Info  Code Status, Allergies Code Status Info: Full Code Allergies Info: Tree nuts;Penicillins           Current Medications (07/08/2018):  This is the current hospital active medication list Current Facility-Administered Medications  Medication Dose Route Frequency Provider Last Rate Last Dose  . 0.9 %  sodium chloride infusion   Intravenous Continuous Rizwan, Eunice Blase, MD      . acetaminophen (TYLENOL) tablet 650 mg  650 mg Oral Q6H PRN Opyd, Ilene Qua, MD   650 mg at 07/08/18 1138   Or  . acetaminophen (TYLENOL) suppository 650 mg  650 mg Rectal Q6H PRN Opyd, Ilene Qua, MD      . albuterol (PROVENTIL) (2.5 MG/3ML) 0.083% nebulizer solution 2.5 mg  2.5 mg Nebulization Q6H PRN Opyd, Ilene Qua, MD      . bisacodyl (DULCOLAX) EC tablet 5 mg  5 mg Oral Daily PRN Opyd, Ilene Qua, MD      . feeding supplement (ENSURE ENLIVE) (ENSURE ENLIVE) liquid 237 mL  237 mL Oral Q24H Charlynne Cousins, MD   237 mL at 07/08/18 0815  . feeding supplement (PRO-STAT SUGAR FREE 64) liquid 30 mL  30 mL Oral Daily Charlynne Cousins, MD   30 mL at 07/07/18 1544  . HYDROmorphone (DILAUDID) injection 1 mg  1 mg Intravenous Q4H PRN Rizwan, Eunice Blase, MD      . loperamide (IMODIUM) capsule 2 mg  2 mg Oral PRN Charlynne Cousins, MD   2 mg at 07/05/18 0908  . metoprolol tartrate (LOPRESSOR) injection 5 mg  5 mg Intravenous Q5 min PRN Lovey Newcomer T, NP   5 mg at 07/05/18 0545  . multivitamin with minerals tablet 1 tablet  1 tablet Oral Daily Charlynne Cousins, MD   1 tablet at 07/08/18 1138  . nutrition supplement (JUVEN) (JUVEN) powder packet 1 packet  1 packet Oral BID BM Charlynne Cousins, MD   1 packet at 07/07/18 2210  . ondansetron  (ZOFRAN) tablet 4 mg  4 mg Oral Q6H PRN Opyd, Ilene Qua, MD       Or  . ondansetron (ZOFRAN) injection 4 mg  4 mg Intravenous Q6H PRN Opyd, Ilene Qua, MD   4 mg at 07/05/18 0450  . oxyCODONE (Oxy IR/ROXICODONE) immediate release tablet 10 mg  10 mg Oral Q4H PRN Debbe Odea, MD   10 mg at 07/08/18 1138  . oxyCODONE (Oxy IR/ROXICODONE) immediate release tablet 5 mg  5 mg Oral Q4H PRN Rizwan, Eunice Blase, MD      . senna-docusate (Senokot-S) tablet 1 tablet  1 tablet Oral QHS PRN Opyd, Ilene Qua, MD      . Derrill Memo ON 07/09/2018] sodium bicarbonate tablet 650 mg  650 mg Oral Daily Debbe Odea, MD   650 mg at 07/08/18 1138  . sodium chloride flush (NS) 0.9 % injection 3 mL  3 mL Intravenous Q12H Opyd, Ilene Qua, MD   3 mL at 07/08/18 1140  . sulfamethoxazole-trimethoprim (BACTRIM DS,SEPTRA DS) 800-160 MG per  tablet 2 tablet  2 tablet Oral Q12H Charlynne Cousins, MD   2 tablet at 07/08/18 1138  . tiZANidine (ZANAFLEX) tablet 4 mg  4 mg Oral Q8H PRN Opyd, Ilene Qua, MD   4 mg at 07/08/18 3643     Discharge Medications: Please see discharge summary for a list of discharge medications.  Relevant Imaging Results:  Relevant Lab Results:   Additional Information SSN  [id number]  Burnis Medin, LCSW

## 2018-07-08 NOTE — Progress Notes (Signed)
Encouraged pt to turn in bed to avoid further skin breakdown and relieve pain. Pt refused. Pt shows low motivation in physical activity and wound prevention.

## 2018-07-08 NOTE — Progress Notes (Signed)
Physical Therapy Treatment Patient Details Name: Dominique [name] MRN: [id number] DOB: 1981-04-16 Today's Date: 07/08/2018    History of Present Illness 37 y.o. 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 Dx of hypovolemia, sepsis, cellulitis    PT Comments    Good progress with mobility today. Pt tolerated increased ambulation distance of 15' with RW and min/guard assist. Pt will likely need ST-SNF for management of wounds and ostomy. Her poor vision likely contributes to her difficulty managing these at home.    Follow Up Recommendations  SNF;Supervision for mobility/OOB     Equipment Recommendations  Rolling walker with 5" wheels;3in1 (PT)(bariatric RW and 3 in 1)    Recommendations for Other Services       Precautions / Restrictions Precautions Precautions: Fall Precaution Comments: pt denies h/o falls in past 1 year; orthostatic on eval 07/07/18, however not orthostatic when checked by RN today Restrictions Weight Bearing Restrictions: No    Mobility  Bed Mobility               General bed mobility comments: up in recliner  Transfers Overall transfer level: Needs assistance Equipment used: Rolling walker (2 wheeled) Transfers: Sit to/from Stand Sit to Stand: Min guard         General transfer comment: VCs for hand placement to rise from recliner  Ambulation/Gait Ambulation/Gait assistance: Min guard Gait Distance (Feet): 50 Feet Assistive device: Rolling walker (2 wheeled) Gait Pattern/deviations: Step-through pattern;Decreased stride length Gait velocity: decr   General Gait Details: decreased velocity, tolerated increased distance today, increased lateral trunk sway but no overt loss of balance   Stairs              Wheelchair Mobility    Modified Rankin (Stroke Patients Only)       Balance Overall balance assessment: Needs assistance   Sitting balance-Leahy Scale: Good     Standing balance support: Bilateral upper extremity supported Standing balance-Leahy Scale: Poor                              Cognition Arousal/Alertness: Awake/alert Behavior During Therapy: WFL for tasks assessed/performed;Flat affect Overall Cognitive Status: Within Functional Limits for tasks assessed                                        Exercises      General Comments        Pertinent Vitals/Pain Pain Assessment: No/denies pain    Home Living                      Prior Function            PT Goals (current goals can now be found in the care plan section) Acute Rehab PT Goals Patient Stated Goal: likes to go shopping with her sister PT Goal Formulation: With patient Time For Goal Achievement: 07/21/18 Potential to Achieve Goals: Good Progress towards PT goals: Progressing toward goals    Frequency    Min 3X/week      PT Plan Current plan remains appropriate    Co-evaluation              AM-PAC  PT "6 Clicks" Daily Activity  Outcome Measure  Difficulty turning over in bed (including adjusting bedclothes, sheets and blankets)?: A Little Difficulty moving from lying on back to sitting on the side of the bed? : A Little Difficulty sitting down on and standing up from a chair with arms (e.g., wheelchair, bedside commode, etc,.)?: A Little Help needed moving to and from a bed to chair (including a wheelchair)?: A Little Help needed walking in hospital room?: A Little Help needed climbing 3-5 steps with a railing? : A Lot 6 Click Score: 17    End of Session Equipment Utilized During Treatment: Gait belt Activity Tolerance: Patient tolerated treatment well(dizziness) Patient left: in chair;with call bell/phone within reach Nurse  Communication: Mobility status PT Visit Diagnosis: Difficulty in walking, not elsewhere classified (R26.2);Dizziness and giddiness (R42)     Time: 4081-4481 PT Time Calculation (min) (ACUTE ONLY): 18 min  Charges:  $Gait Training: 8-22 mins                     Blondell Reveal Kistler PT 07/08/2018  Acute Rehabilitation Services Pager [phone] Office [phone]

## 2018-07-08 NOTE — Progress Notes (Signed)
PROGRESS NOTE    Dominique [name]   JME:[id number]  DOB: 11-09-1980  DOA: 07/04/2018 PCP: Benito Mccreedy, MD   Brief Narrative:  Dominique [name] is an 37 y.o. 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 She presented to the ED for lightheaded sensation when she was sitting or standing up. HR noted to be 140s, sinus tachycardia.     Subjective: Having pain in wounds when she moves around- very dizzy on standing- oral intake slightly improved but not drinking much fluids    Assessment & Plan:   Principal Problem:   Hypovolemia/   Orthostatic hypotension/ hyponatremia -   yesterday she was still orthostatic and very dizzy on standing - on rechecking orthostatic vitals today, HR goes up from 80 >> 145- BP drops only slightly but she is still symptomatic - will resume IVF- I have encouraged her to drink more water  Active Problems: Metabolic acidosis/ AKI - suspect prerenal - cut back on Bicarb tabs from 3 to 1 x day and follow Bicarb levels - Cr has improved but as she is still orthostatic, will need to continue IVF today   Crohn's colitis with perforation s/p left colectomy/colostomy 2016  cont to follow stool output to ensure we are matching her output with oral and IV fluids  Severe wounds with cellulitis and sepsis - has chronic wounds in abdomen from prior surgery and new wounds due to moisture damage to skin in skin folds/ panus and vaginal area which are full thickness wounds  - will increase pain medications as patient states Tramadol is inadequate- add Oxycodone and PRN Dilaudid - cont Bactrim  Indwelling urinary catheter - to prevent soiling of wounds  Loss of appetite/ mod protein calorie malnutrition - has not been eating much at home and has lost  about 30 lbs - continue supplements    OSA on CPAP  Severe deconditioning  -meets criteria to get rehab at SNF which she is in agreement with     DVT prophylaxis: SCDs Code Status: Full code Family Communication:  Disposition Plan: likely will go to SNF Consultants:   Wound care Procedures:    Antimicrobials:  Anti-infectives (From admission, onward)   Start     Dose/Rate Route Frequency Ordered Stop   07/07/18 1000  vancomycin (VANCOCIN) 1,500 mg in sodium chloride 0.9 % 500 mL IVPB  Status:  Discontinued     1,500 mg 250 mL/hr over 120 Minutes Intravenous Every 24 hours 07/06/18 1014 07/07/18 0938   07/07/18 1000  sulfamethoxazole-trimethoprim (BACTRIM DS,SEPTRA DS) 800-160 MG per tablet 2 tablet     2 tablet Oral Every 12 hours 07/07/18 0938     07/06/18 1200  cefTRIAXone (ROCEPHIN) 2 g in sodium chloride 0.9 % 100 mL IVPB  Status:  Discontinued     2 g 200 mL/hr over 30 Minutes Intravenous Every 24 hours 07/06/18 0948 07/07/18 0938   07/06/18 1100  doxycycline (VIBRAMYCIN) 100 mg in sodium chloride 0.9 % 250 mL IVPB  Status:  Discontinued     100 mg 125 mL/hr over 120 Minutes Intravenous Every 12 hours 07/06/18 0915 07/06/18 1002   07/06/18 1015  vancomycin (VANCOCIN) 2,500 mg in sodium chloride 0.9 % 500 mL IVPB     2,500 mg 250 mL/hr over 120 Minutes Intravenous  Once 07/06/18 1014 07/07/18 0826  Objective: Vitals:   07/07/18 1533 07/07/18 2108 07/08/18 0608 07/08/18 1129  BP: 102/62 (!) 117/57 (!) 103/56   Pulse: 84 87 80   Resp: 20 18 16    Temp: 98.1 F (36.7 C) 98.5 F (36.9 C) 98 F (36.7 C)   TempSrc: Oral Oral Oral   SpO2: 99% 100% 96% 98%  Weight:      Height:        Intake/Output Summary (Last 24 hours) at 07/08/2018 1351 Last data filed at 07/08/2018 2694 Gross per 24 hour  Intake 120 ml  Output 1000 ml  Net -880 ml   Filed Weights   07/04/18 2339 07/05/18 0540 07/06/18 0504  Weight: (!) 137.6 kg (!) 137.8 kg (!) 139.5 kg     Examination: General exam: Appears comfortable - morbidly obese HEENT: PERRLA, oral mucosa moist, no sclera icterus or thrush Respiratory system: Clear to auscultation. Respiratory effort normal. Cardiovascular system: S1 & S2 heard, RRR.   Gastrointestinal system: Abdomen soft, non-tender, nondistended. Normal bowel sound. No organomegaly Central nervous system: Alert and oriented. No focal neurological deficits. Extremities: No cyanosis, clubbing or edema Skin: extensive full thickness wound under breasts, on abdomen, under pannus and in groin/ vulvar area with large amounts of foul smelling discharge- extensive pain noted when trying to examine wounds Psychiatry:  Depressed, tearful    Data Reviewed: I have personally reviewed following labs and imaging studies  CBC: Recent Labs  Lab 07/04/18 2055 07/05/18 0522 07/08/18 0547  WBC 9.7 11.8* 8.8  NEUTROABS 7.4 8.7*  --   HGB 9.4* 9.3* 8.5*  HCT 32.6* 30.6* 29.4*  MCV 77.8* 74.8* 78.6*  PLT 448* 434* 854*   Basic Metabolic Panel: Recent Labs  Lab 07/04/18 2055 07/05/18 0522 07/06/18 0608 07/07/18 0611 07/08/18 0547  NA 133* 134* 138 138 139  K 4.2 4.4 4.5 4.9 4.0  CL 107 109 110 112* 110  CO2 16* 15* 17* 23 23  GLUCOSE 83 72 100* 111* 107*  BUN 14 13 9 10 10   CREATININE 1.16* 0.96 0.93 1.06* 0.91  CALCIUM 8.4* 8.3* 8.6* 8.3* 8.2*   GFR: Estimated Creatinine Clearance: 113.7 mL/min (by C-G formula based on SCr of 0.91 mg/dL). Liver Function Tests: Recent Labs  Lab 07/04/18 2055  AST 22  ALT 11  ALKPHOS 128*  BILITOT 0.6  PROT 7.3  ALBUMIN 2.4*   No results for input(s): LIPASE, AMYLASE in the last 168 hours. No results for input(s): AMMONIA in the last 168 hours. Coagulation Profile: No results for input(s): INR, PROTIME in the last 168 hours. Cardiac Enzymes: No results for input(s): CKTOTAL, CKMB, CKMBINDEX, TROPONINI in the last 168 hours. BNP (last 3 results) No results for input(s): PROBNP in  the last 8760 hours. HbA1C: No results for input(s): HGBA1C in the last 72 hours. CBG: No results for input(s): GLUCAP in the last 168 hours. Lipid Profile: No results for input(s): CHOL, HDL, LDLCALC, TRIG, CHOLHDL, LDLDIRECT in the last 72 hours. Thyroid Function Tests: No results for input(s): TSH, T4TOTAL, FREET4, T3FREE, THYROIDAB in the last 72 hours. Anemia Panel: No results for input(s): VITAMINB12, FOLATE, FERRITIN, TIBC, IRON, RETICCTPCT in the last 72 hours. Urine analysis:    Component Value Date/Time   COLORURINE YELLOW 07/05/2018 [place] 07/05/2018 0511   LABSPEC 1.020 07/05/2018 0511   PHURINE 5.0 07/05/2018 0511   GLUCOSEU NEGATIVE 07/05/2018 0511   HGBUR NEGATIVE 07/05/2018 0511   BILIRUBINUR NEGATIVE 07/05/2018 0511   KETONESUR 5 (A) 07/05/2018  [place] (A) 07/05/2018 0511   UROBILINOGEN 0.2 07/22/2015 0405   NITRITE NEGATIVE 07/05/2018 0511   LEUKOCYTESUR NEGATIVE 07/05/2018 0511   Sepsis Labs: @LABRCNTIP (procalcitonin:4,lacticidven:4) ) Recent Results (from the past 240 hour(s))  Blood culture (routine x 2)     Status: Abnormal   Collection Time: 07/04/18  8:07 PM  Result Value Ref Range Status   Specimen Description   Final    BLOOD RIGHT ANTECUBITAL Performed at [facility], [place] [address] [place], [place] [address]    Special Requests   Final    BOTTLES DRAWN AEROBIC AND ANAEROBIC Blood Culture results may not be optimal due to an excessive volume of blood received in culture bottles Performed at [place] [address], [place], [place] [address]    Culture  Setup Time   Final    GRAM POSITIVE COCCI AEROBIC BOTTLE ONLY CRITICAL RESULT CALLED TO, READ BACK BY AND VERIFIED WITH: J.GRIMSLEY,PHARMD AT 2446 ON 07/06/18 BY G.MCADOO    Culture (A)  Final    STAPHYLOCOCCUS SPECIES (COAGULASE NEGATIVE) THE SIGNIFICANCE OF ISOLATING THIS ORGANISM FROM A SINGLE SET OF BLOOD CULTURES  WHEN MULTIPLE SETS ARE DRAWN IS UNCERTAIN. PLEASE NOTIFY THE MICROBIOLOGY DEPARTMENT WITHIN ONE WEEK IF SPECIATION AND SENSITIVITIES ARE REQUIRED. Performed at [facility], [place] [address], [place], [place] [address]    Report Status 07/07/2018 FINAL  Final  Blood Culture ID Panel (Reflexed)     Status: Abnormal   Collection Time: 07/04/18  8:07 PM  Result Value Ref Range Status   Enterococcus species NOT DETECTED NOT DETECTED Final   Listeria monocytogenes NOT DETECTED NOT DETECTED Final   Staphylococcus species DETECTED (A) NOT DETECTED Final    Comment: CRITICAL RESULT CALLED TO, READ BACK BY AND VERIFIED WITH: J.GRIMSLEY,PHARMD AT 1771 ON 07/06/18 BY G.MCADOO    Staphylococcus aureus (BCID) NOT DETECTED NOT DETECTED Final   Methicillin resistance DETECTED (A) NOT DETECTED Final    Comment: CRITICAL RESULT CALLED TO, READ BACK BY AND VERIFIED WITH: J.GRIMSLEY,PHARMD AT 0508 ON 07/06/18 BY G.MCADOO    Streptococcus species NOT DETECTED NOT DETECTED Final   Streptococcus agalactiae NOT DETECTED NOT DETECTED Final   Streptococcus pneumoniae NOT DETECTED NOT DETECTED Final   Streptococcus pyogenes NOT DETECTED NOT DETECTED Final   Acinetobacter baumannii NOT DETECTED NOT DETECTED Final   Enterobacteriaceae species NOT DETECTED NOT DETECTED Final   Enterobacter cloacae complex NOT DETECTED NOT DETECTED Final   Escherichia coli NOT DETECTED NOT DETECTED Final   Klebsiella oxytoca NOT DETECTED NOT DETECTED Final   Klebsiella pneumoniae NOT DETECTED NOT DETECTED Final   Proteus species NOT DETECTED NOT DETECTED Final   Serratia marcescens NOT DETECTED NOT DETECTED Final   Haemophilus influenzae NOT DETECTED NOT DETECTED Final   Neisseria meningitidis NOT DETECTED NOT DETECTED Final   Pseudomonas aeruginosa NOT DETECTED NOT DETECTED Final   Candida albicans NOT DETECTED NOT DETECTED Final   Candida glabrata NOT DETECTED NOT DETECTED Final   Candida krusei NOT DETECTED NOT DETECTED  Final   Candida parapsilosis NOT DETECTED NOT DETECTED Final   Candida tropicalis NOT DETECTED NOT DETECTED Final    Comment: Performed at [facility], [place] [address], [place], [place] [address]  Blood culture (routine x 2)     Status: None (Preliminary result)   Collection Time: 07/04/18  8:55 PM  Result Value Ref Range Status   Specimen Description   Final    BLOOD LEFT  ARM Performed at [facility], [place] [address], [place], [place] [address]    Special Requests   Final    BOTTLES DRAWN AEROBIC ONLY Blood Culture adequate volume Performed at [place] [address], [place], [place] [address]    Culture   Final    NO GROWTH 3 DAYS Performed at [facility], [place] [address] [place], [place] [address]    Report Status PENDING  Incomplete  Urine culture     Status: None   Collection Time: 07/05/18  5:11 AM  Result Value Ref Range Status   Specimen Description   Final    URINE, CATHETERIZED Performed at [place] [address], [place], [place] [address]    Special Requests   Final    NONE Performed at [facility], [place] [address], [place], [place] [address]    Culture   Final    NO GROWTH Performed at [facility], [place] [address], [place],  [address]    Report Status 07/06/2018 FINAL  Final         Radiology Studies: No results found.    Scheduled Meds: . feeding supplement (ENSURE ENLIVE)  237 mL Oral Q24H  . feeding supplement (PRO-STAT SUGAR FREE 64)  30 mL Oral Daily  . multivitamin with minerals  1 tablet Oral Daily  . nutrition supplement (JUVEN)  1 packet Oral BID BM  . [START ON 07/09/2018] sodium bicarbonate  650 mg Oral Daily  . sodium chloride flush  3 mL Intravenous Q12H  . sulfamethoxazole-trimethoprim  2 tablet Oral Q12H   Continuous Infusions: . sodium chloride       LOS: 2 days    Time spent in minutes: 40    Debbe Odea,  MD [facility] Pager: [email or web address] Password [email or web address] 07/08/2018, 1:51 PM

## 2018-07-09 LAB — CBC
HEMATOCRIT: 26.9 % — AB (ref 36.0–46.0)
HEMOGLOBIN: 7.9 g/dL — AB (ref 12.0–15.0)
MCH: 23.3 pg — ABNORMAL LOW (ref 26.0–34.0)
MCHC: 29.4 g/dL — ABNORMAL LOW (ref 30.0–36.0)
MCV: 79.4 fL — AB (ref 80.0–100.0)
NRBC: 0 % (ref 0.0–0.2)
Platelets: 488 10*3/uL — ABNORMAL HIGH (ref 150–400)
RBC: 3.39 MIL/uL — AB (ref 3.87–5.11)
RDW: 23.3 % — ABNORMAL HIGH (ref 11.5–15.5)
WBC: 8 10*3/uL (ref 4.0–10.5)

## 2018-07-09 LAB — PREPARE RBC (CROSSMATCH)

## 2018-07-09 LAB — HEMOGLOBIN AND HEMATOCRIT, BLOOD
HEMATOCRIT: 30.8 % — AB (ref 36.0–46.0)
Hemoglobin: 9.6 g/dL — ABNORMAL LOW (ref 12.0–15.0)

## 2018-07-09 MED ORDER — SODIUM CHLORIDE 0.9 % IV SOLN
510.0000 mg | Freq: Once | INTRAVENOUS | Status: AC
  Administered 2018-07-09: 510 mg via INTRAVENOUS
  Filled 2018-07-09: qty 17

## 2018-07-09 MED ORDER — ADULT MULTIVITAMIN W/MINERALS CH: 1.0000 | ORAL_TABLET | Freq: Every day | ORAL | Status: AC

## 2018-07-09 MED ORDER — JUVEN PO PACK: 1.0000 | PACK | Freq: Two times a day (BID) | ORAL | 0 refills | Status: AC

## 2018-07-09 MED ORDER — SULFAMETHOXAZOLE-TRIMETHOPRIM 400-80 MG PO TABS: 1.0000 | ORAL_TABLET | Freq: Two times a day (BID) | ORAL | Status: DC

## 2018-07-09 MED ORDER — PRO-STAT SUGAR FREE PO LIQD: 30.0000 mL | Freq: Every day | ORAL | 0 refills | Status: AC

## 2018-07-09 MED ORDER — SODIUM CHLORIDE 0.9% IV SOLUTION
Freq: Once | INTRAVENOUS | Status: AC
  Administered 2018-07-09: 1 mL via INTRAVENOUS

## 2018-07-09 MED ORDER — ENSURE ENLIVE PO LIQD: 237.0000 mL | ORAL | 12 refills | Status: AC

## 2018-07-09 MED ORDER — OXYCODONE HCL 5 MG PO TABS: 5.0000 mg | ORAL_TABLET | ORAL | 0 refills | Status: DC | PRN

## 2018-07-09 MED ORDER — BISACODYL 5 MG PO TBEC: 5.0000 mg | DELAYED_RELEASE_TABLET | Freq: Every day | ORAL | 0 refills | Status: AC | PRN

## 2018-07-09 NOTE — Progress Notes (Signed)
CSW following to assist with discharge planning to SNF. Patient currently has no bed offers and no payor source for SNF. Patient has community Medicaid that doesn't cover SNF. CSW and patient discussed patient's income, patient reported that she currently has no income. Patient reported that she has applied for disability 3 times and has been denied due to her diagnosis. Patient reported that the last time she applied for disability was 1 1/2 years ago. Patient reported that she will have her sister reapply for disability online for her tonight. CSW inquired if patient had anyone that could apply for [place]al[place]a Caradonna term care medicaid for her at [place]. Patient reported no one, noting her sister and dad are her only local supports and unable to get DSS. Patient reported that she will see if her sister can apply online for Neasia Fleeman term care medicaid for patient.   CSW staffed case with [place] Surveyor, quantity. CSW Surveyor, quantity reported that patient will need to apply for [place] Adinolfi term care medicaid to have a payor source for SNF. CSW Surveyor, quantity assisting CSW with SNF placement for patient.  CSW will continue to follow and assist with discharge planning.  Abundio Miu, [place] Social Worker [facility] Cell#: [phone]

## 2018-07-09 NOTE — Care Management Note (Signed)
Case Management Note  Patient Details  Name: Dominique [name] MRN: [id number] Date of Birth: 1981-01-24  Subjective/Objective: PT recc SNF. CSW following. Discussions about best d/c plan considering insurance-patient from home w/ father-goes to dialysis,unable to provide asst, has minor child 38 yrs old. Has sister but doesn't live w/patient but assts periodically. See [place] note for wound size,& treatment. Patient morbid obese w/abd,groin,vaginal wounds underneath folds of adipose skin. Has had [facility] & currently active but needs higher level of care to avoid readmission. Also has ileostomy.Recc SNF. MD updated.                Action/Plan:d/c SNF.   Expected Discharge Date:                  Expected Discharge Plan:  Skilled Nursing Facility  In-House Referral:  Clinical Social Work  Discharge planning Services  CM Consult  Post Acute Care Choice:  Home Health(Active w/[facility]) Choice offered to:     DME Arranged:    DME Agency:     HH Arranged:    [place] Agency:     Status of Service:  In process, will continue to follow  If discussed at Long Length of Stay Meetings, dates discussed:    Additional Comments:  Dessa Phi, RN 07/09/2018, 11:06 AM

## 2018-07-09 NOTE — Progress Notes (Addendum)
PROGRESS NOTE    Dominique [name]   VVO:[id number]  DOB: 01/30/1981  DOA: 07/04/2018 PCP: Benito Mccreedy, MD   Brief Narrative:  Dominique [name] is an 37 y.o. 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 She presented to the ED for lightheaded sensation when she was sitting or standing up. HR noted to be 140s, sinus tachycardia.     Subjective: Pain in her wounds is much better controlled on higher doses of pain medications. Now drinking more fluids.     Assessment & Plan:   Principal Problem:   Hypovolemia/   Orthostatic hypotension/ hyponatremia -   yesterday she was still orthostatic and very dizzy on standing - on rechecking orthostatic vitals today, HR goes up from 80 >> 145- BP drops only slightly but she is still symptomatic -  resumed IVF- I have encouraged her to drink more water- follow up on orthostatics today  Active Problems: Anemia due to acute blood loss and Iron deficiency - has bleeding from wounds and her periods - Hb 9.4 >> 7.9 today - Iron level 14, Ferretin was 15 on 9/19 - will need to give 1 U PRBC and a dose of IV Iron  Metabolic acidosis/ AKI - suspect prerenal - cut back on Bicarb tabs from 3 to 1 x day and follow Bicarb level today - Cr has improved     Crohn's colitis with perforation s/p left colectomy/colostomy 2016  cont to follow stool output to ensure we are matching her output with oral and IV fluids  Severe wounds with cellulitis and sepsis - has chronic wounds in abdomen from prior surgery and new wounds due to moisture damage to skin in skin folds/ panus and vaginal area which are full thickness wounds  - added Oxycodone and PRN Dilaudid which appears to be helping - cont Bactrim  Indwelling urinary catheter - to  prevent soiling of wounds  Loss of appetite/ mod protein calorie malnutrition - has not been eating much at home and has lost about 30 lbs - continue supplements    OSA on CPAP  Severe deconditioning  -meets criteria to get rehab at SNF which she is in agreement with     DVT prophylaxis: SCDs Code Status: Full code Family Communication:  Disposition Plan: likely will go to SNF Consultants:   Wound care Procedures:    Antimicrobials:  Anti-infectives (From admission, onward)   Start     Dose/Rate Route Frequency Ordered Stop   07/07/18 1000  vancomycin (VANCOCIN) 1,500 mg in sodium chloride 0.9 % 500 mL IVPB  Status:  Discontinued     1,500 mg 250 mL/hr over 120 Minutes Intravenous Every 24 hours 07/06/18 1014 07/07/18 0938   07/07/18 1000  sulfamethoxazole-trimethoprim (BACTRIM DS,SEPTRA DS) 800-160 MG per tablet 2 tablet     2 tablet Oral Every 12 hours 07/07/18 0938     07/06/18 1200  cefTRIAXone (ROCEPHIN) 2 g in sodium chloride 0.9 % 100 mL IVPB  Status:  Discontinued     2 g 200 mL/hr over 30 Minutes Intravenous Every 24 hours 07/06/18 0948 07/07/18 0938   07/06/18 1100  doxycycline (VIBRAMYCIN) 100 mg in sodium chloride 0.9 % 250 mL IVPB  Status:  Discontinued     100 mg 125 mL/hr over 120 Minutes Intravenous Every 12 hours 07/06/18 0915 07/06/18  1002   07/06/18 1015  vancomycin (VANCOCIN) 2,500 mg in sodium chloride 0.9 % 500 mL IVPB     2,500 mg 250 mL/hr over 120 Minutes Intravenous  Once 07/06/18 1014 07/07/18 0826       Objective: Vitals:   07/08/18 1357 07/08/18 1359 07/08/18 2129 07/09/18 0647  BP: (!) 89/62 91/62 122/60 128/60  Pulse: (!) 105 (!) 106 (!) 106 (!) 124  Resp: 18  18 20   Temp: 97.8 F (36.6 C)  97.9 F (36.6 C) 98.5 F (36.9 C)  TempSrc: Oral  Oral Oral  SpO2: 100% 100% 100% 99%  Weight:      Height:        Intake/Output Summary (Last 24 hours) at 07/09/2018 1234 Last data filed at 07/09/2018 1093 Gross per 24 hour  Intake 1565  ml  Output 800 ml  Net 765 ml   Filed Weights   07/04/18 2339 07/05/18 0540 07/06/18 0504  Weight: (!) 137.6 kg (!) 137.8 kg (!) 139.5 kg    Examination:  General exam: Appears comfortable  HEENT: PERRLA, oral mucosa moist, no sclera icterus or thrush Respiratory system: Clear to auscultation. Respiratory effort normal. Cardiovascular system: S1 & S2 heard,  No murmurs  Gastrointestinal system: Abdomen soft, non-tender, nondistended. Normal bowel sound. No organomegaly Central nervous system: Alert and oriented. No focal neurological deficits. Extremities: No cyanosis, clubbing or edema Skin: extesive full thickness ulcers in skin folds and on abdomen and vulva Psychiatry:  flat affect     Data Reviewed: I have personally reviewed following labs and imaging studies  CBC: Recent Labs  Lab 07/04/18 2055 07/05/18 0522 07/08/18 0547 07/09/18 0943  WBC 9.7 11.8* 8.8 8.0  NEUTROABS 7.4 8.7*  --   --   HGB 9.4* 9.3* 8.5* 7.9*  HCT 32.6* 30.6* 29.4* 26.9*  MCV 77.8* 74.8* 78.6* 79.4*  PLT 448* 434* 453* 235*   Basic Metabolic Panel: Recent Labs  Lab 07/04/18 2055 07/05/18 0522 07/06/18 0608 07/07/18 0611 07/08/18 0547  NA 133* 134* 138 138 139  K 4.2 4.4 4.5 4.9 4.0  CL 107 109 110 112* 110  CO2 16* 15* 17* 23 23  GLUCOSE 83 72 100* 111* 107*  BUN 14 13 9 10 10   CREATININE 1.16* 0.96 0.93 1.06* 0.91  CALCIUM 8.4* 8.3* 8.6* 8.3* 8.2*   GFR: Estimated Creatinine Clearance: 113.7 mL/min (by C-G formula based on SCr of 0.91 mg/dL). Liver Function Tests: Recent Labs  Lab 07/04/18 2055  AST 22  ALT 11  ALKPHOS 128*  BILITOT 0.6  PROT 7.3  ALBUMIN 2.4*   No results for input(s): LIPASE, AMYLASE in the last 168 hours. No results for input(s): AMMONIA in the last 168 hours. Coagulation Profile: No results for input(s): INR, PROTIME in the last 168 hours. Cardiac Enzymes: No results for input(s): CKTOTAL, CKMB, CKMBINDEX, TROPONINI in the last 168 hours. BNP  (last 3 results) No results for input(s): PROBNP in the last 8760 hours. HbA1C: No results for input(s): HGBA1C in the last 72 hours. CBG: No results for input(s): GLUCAP in the last 168 hours. Lipid Profile: No results for input(s): CHOL, HDL, LDLCALC, TRIG, CHOLHDL, LDLDIRECT in the last 72 hours. Thyroid Function Tests: No results for input(s): TSH, T4TOTAL, FREET4, T3FREE, THYROIDAB in the last 72 hours. Anemia Panel: No results for input(s): VITAMINB12, FOLATE, FERRITIN, TIBC, IRON, RETICCTPCT in the last 72 hours. Urine analysis:    Component Value Date/Time   COLORURINE YELLOW 07/05/2018 [place]  07/05/2018 0511   LABSPEC 1.020 07/05/2018 0511   PHURINE 5.0 07/05/2018 0511   GLUCOSEU NEGATIVE 07/05/2018 0511   HGBUR NEGATIVE 07/05/2018 0511   BILIRUBINUR NEGATIVE 07/05/2018 0511   KETONESUR 5 (A) 07/05/2018 0511   PROTEINUR 30 (A) 07/05/2018 0511   UROBILINOGEN 0.2 07/22/2015 0405   NITRITE NEGATIVE 07/05/2018 0511   LEUKOCYTESUR NEGATIVE 07/05/2018 0511   Sepsis Labs: @LABRCNTIP (procalcitonin:4,lacticidven:4) ) Recent Results (from the past 240 hour(s))  Blood culture (routine x 2)     Status: Abnormal   Collection Time: 07/04/18  8:07 PM  Result Value Ref Range Status   Specimen Description   Final    BLOOD RIGHT ANTECUBITAL Performed at [facility], [place] [address], [place], [place] [address]    Special Requests   Final    BOTTLES DRAWN AEROBIC AND ANAEROBIC Blood Culture results may not be optimal due to an excessive volume of blood received in culture bottles Performed at [facility] [address], [place], [place] [address]    Culture  Setup Time   Final    GRAM POSITIVE COCCI AEROBIC BOTTLE ONLY CRITICAL RESULT CALLED TO, READ BACK BY AND VERIFIED WITH: J.GRIMSLEY,PHARMD AT 3818 ON 07/06/18 BY G.MCADOO    Culture (A)  Final    STAPHYLOCOCCUS SPECIES (COAGULASE NEGATIVE) THE SIGNIFICANCE OF  ISOLATING THIS ORGANISM FROM A SINGLE SET OF BLOOD CULTURES WHEN MULTIPLE SETS ARE DRAWN IS UNCERTAIN. PLEASE NOTIFY THE MICROBIOLOGY DEPARTMENT WITHIN ONE WEEK IF SPECIATION AND SENSITIVITIES ARE REQUIRED. Performed at [facility], [place] [address], [place], [place] [address]    Report Status 07/07/2018 FINAL  Final  Blood Culture ID Panel (Reflexed)     Status: Abnormal   Collection Time: 07/04/18  8:07 PM  Result Value Ref Range Status   Enterococcus species NOT DETECTED NOT DETECTED Final   Listeria monocytogenes NOT DETECTED NOT DETECTED Final   Staphylococcus species DETECTED (A) NOT DETECTED Final    Comment: CRITICAL RESULT CALLED TO, READ BACK BY AND VERIFIED WITH: J.GRIMSLEY,PHARMD AT 1696 ON 07/06/18 BY G.MCADOO    Staphylococcus aureus (BCID) NOT DETECTED NOT DETECTED Final   Methicillin resistance DETECTED (A) NOT DETECTED Final    Comment: CRITICAL RESULT CALLED TO, READ BACK BY AND VERIFIED WITH: J.GRIMSLEY,PHARMD AT 0508 ON 07/06/18 BY G.MCADOO    Streptococcus species NOT DETECTED NOT DETECTED Final   Streptococcus agalactiae NOT DETECTED NOT DETECTED Final   Streptococcus pneumoniae NOT DETECTED NOT DETECTED Final   Streptococcus pyogenes NOT DETECTED NOT DETECTED Final   Acinetobacter baumannii NOT DETECTED NOT DETECTED Final   Enterobacteriaceae species NOT DETECTED NOT DETECTED Final   Enterobacter cloacae complex NOT DETECTED NOT DETECTED Final   Escherichia coli NOT DETECTED NOT DETECTED Final   Klebsiella oxytoca NOT DETECTED NOT DETECTED Final   Klebsiella pneumoniae NOT DETECTED NOT DETECTED Final   Proteus species NOT DETECTED NOT DETECTED Final   Serratia marcescens NOT DETECTED NOT DETECTED Final   Haemophilus influenzae NOT DETECTED NOT DETECTED Final   Neisseria meningitidis NOT DETECTED NOT DETECTED Final   Pseudomonas aeruginosa NOT DETECTED NOT DETECTED Final   Candida albicans NOT DETECTED NOT DETECTED Final   Candida glabrata NOT DETECTED NOT  DETECTED Final   Candida krusei NOT DETECTED NOT DETECTED Final   Candida parapsilosis NOT DETECTED NOT DETECTED Final   Candida tropicalis NOT DETECTED NOT DETECTED Final    Comment: Performed at [facility], [place] [address], [place],  [address]  Blood  culture (routine x 2)     Status: None (Preliminary result)   Collection Time: 07/04/18  8:55 PM  Result Value Ref Range Status   Specimen Description   Final    BLOOD LEFT ARM Performed at [facility] [address], [place], [place] [address]    Special Requests   Final    BOTTLES DRAWN AEROBIC ONLY Blood Culture adequate volume Performed at [place] [address], [place], [place] [address]    Culture   Final    NO GROWTH 4 DAYS Performed at [facility], [place] [address] [place], [place] [address]    Report Status PENDING  Incomplete  Urine culture     Status: None   Collection Time: 07/05/18  5:11 AM  Result Value Ref Range Status   Specimen Description   Final    URINE, CATHETERIZED Performed at [place] [address], [place], [place] [address]    Special Requests   Final    NONE Performed at [facility], [place] [address] [place], [place] [address]    Culture   Final    NO GROWTH Performed at [facility], [place] [address] [place], [place] [address]    Report Status 07/06/2018 FINAL  Final         Radiology Studies: No results found.    Scheduled Meds: . feeding supplement (ENSURE ENLIVE)  237 mL Oral Q24H  . feeding supplement (PRO-STAT SUGAR FREE 64)  30 mL Oral Daily  . multivitamin with minerals  1 tablet Oral Daily  . nutrition supplement (JUVEN)  1 packet Oral BID BM  . sodium bicarbonate  650 mg Oral Daily  . sodium chloride flush  3 mL Intravenous Q12H  . sulfamethoxazole-trimethoprim  2 tablet Oral Q12H   Continuous Infusions: . sodium chloride 75 mL/hr at 07/09/18 0652     LOS: 3  days    Time spent in minutes: 40    Debbe Odea, MD [facility] Pager: [email or web address] Password [facility] 07/09/2018, 12:34 PM

## 2018-07-09 NOTE — Progress Notes (Signed)
Patient received a discharge order around 5 pm . Patient's unit of blood is currently running. I haven't heard anything from social work other than what was discussed with Education officer, museum, Joelene Millin, earlier today. I've tried to call multiple numbers and looked for a number on Amion for a social worker on call but could not get in touch with anyone. After reading notes and talking with patient, she still seems to have papers that she needs to fill out for insurance coverage tomorrow before going to a rehab. Patient knows discharge order is in but knows that she will be staying the night at the hospital. RN will update the nightshift RN on patient's plan.  Timoteo Gaul, RN

## 2018-07-09 NOTE — Progress Notes (Addendum)
Occupational Therapy Treatment Patient Details Name: Dominique [name] MRN: [id number] DOB: May 31, 1981 Today's Date: 07/09/2018    History of present illness 37 y.o. 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 Dx of hypovolemia, sepsis, cellulitis   OT comments  Pt up to chair with walker. HR noted to be elevated during session-135 at EOB and 163 in standing and up to 177 in standing. Once sitting in chair HR down to 138-144. Nursing aware of orthostatic vitals (see below) and HR and in room with OT during part of session. Will continue to follow pt to progress ADL independence as pt can tolerate.     Follow Up Recommendations  SNF;Supervision/Assistance - 24 hour    Equipment Recommendations  3 in 1 bedside commode    Recommendations for Other Services      Precautions / Restrictions Precautions Precautions: Fall Precaution Comments: monitor HR; monitor orthostatics Restrictions Weight Bearing Restrictions: No       Mobility Bed Mobility Overal bed mobility: Needs Assistance         Sit to supine: Min assist;HOB elevated   General bed mobility comments: much increased time.  Transfers Overall transfer level: Needs assistance Equipment used: Rolling walker (2 wheeled) Transfers: Sit to/from [facility] Sit to Stand: Min assist;From elevated surface Stand pivot transfers: Min assist;From elevated surface       General transfer comment: verbal cues for hand placement.    Balance                                           ADL either performed or assessed with clinical judgement   ADL                           Toilet Transfer: Minimal assistance;Stand-pivot;RW             General ADL Comments: Pt  up to EOB and sat several minutes. Pt stating her pain is in groin area at 6/10. Notified nursing. Orthostatic vitals taken and were 130/66 supine HR 124. Sitting at EOB 137/65 HR 135; and standing 122/60 HR 163- 177. Nursing present in room. Nursing stated ok for pt to pivot to chair. Assisted pt with min assist and walker to pivot around to chair. HR taken again and down to 138-144 range. Pt not in any notable distress and encouraged to rest.      Vision Patient Visual Report: No change from baseline     Perception     Praxis      Cognition Arousal/Alertness: Awake/alert Behavior During Therapy: WFL for tasks assessed/performed Overall Cognitive Status: Within Functional Limits for tasks assessed                                          Exercises     Shoulder Instructions       General Comments      Pertinent Vitals/ Pain       Pain Assessment: 0-10 Pain Score: 6  Pain Location: groin Pain Descriptors / Indicators: Sore Pain Intervention(s): Monitored during session;Patient requesting pain meds-RN  notified  Home Living                                          Prior Functioning/Environment              Frequency  Min 2X/week        Progress Toward Goals  OT Goals(current goals can now be found in the care plan section)  Progress towards OT goals: Progressing toward goals     Plan Discharge plan remains appropriate    Co-evaluation                 AM-PAC PT "6 Clicks" Daily Activity     Outcome Measure   Help from another person eating meals?: None Help from another person taking care of personal grooming?: A Little Help from another person toileting, which includes using toliet, bedpan, or urinal?: A Lot Help from another person bathing (including washing, rinsing, drying)?: A Lot Help from another person to put on and taking off regular upper body clothing?: A Little Help from another person to put on and  taking off regular lower body clothing?: A Lot 6 Click Score: 16    End of Session Equipment Utilized During Treatment: Rolling walker  OT Visit Diagnosis: Unsteadiness on feet (R26.81);Muscle weakness (generalized) (M62.81)   Activity Tolerance Patient limited by pain;Patient limited by fatigue   Patient Left in chair;with call bell/phone within reach;with nursing/sitter in room   Nurse Communication          Time: 2500-3704 OT Time Calculation (min): 32 min  Charges: OT General Charges $OT Visit: 1 Visit OT Treatments $Therapeutic Activity: 8-22 mins     Jae Dire Nemesio Castrillon OTR/L Acute Rehab [phone] 07/09/2018, 1:12 PM

## 2018-07-09 NOTE — Discharge Summary (Addendum)
Physician Discharge Summary  Dominique [name] VOZ:[id number] DOB: 04/22/1981 DOA: 07/04/2018  PCP: Benito Mccreedy, MD  Admit date: 07/04/2018 Discharge date: 07/10/2018  Admitted From: home Disposition:  SNF  Recommendations for Outpatient Follow-up:  1. F/u orthostatics and oral intake of fluids 2. Recommend 1 more dose of Feraheme in 1 wk 3. Bactrim to be stopped in 1 wk  Discharge Condition: stable   CODE STATUS:  Full code  Diet recommendation:  Heart healthy Consultations:  none   Discharge Diagnoses:  Principal Problem:   Hypovolemia Active Problems:   Orthostatic hypotension   Multiple wounds of skin- infected   AKI   Hyponatremia   Malnutrition of moderate degree   Crohn's colitis with perforation s/p left colectomy/colostomy 2016   OSA on CPAP   Chronic anemia   Brief Narrative:  Dominique [name] is an 37 y.o.femalepast medical history significant for obesity obstructive sleep apnea Crohn's, status post perforation with colostomy chronic wounds and abdomen and bilateral groin, who was in his usual health until she developed recurrent nausea and vomiting several days ago. She presented to the ED for lightheaded sensation when she was sitting or standing up. HR noted to be 140s, sinus tachycardia.       Assessment & Plan:   Principal Problem:   Hypovolemia/   Orthostatic hypotension/ hyponatremia - resolved after aggressive treatment with IVF  Active Problems: Anemia due to acute blood loss and Iron deficiency - has bleeding from wounds and has is on her menses (notably heavy per patient and nursing staff) - Hb 9.4 >> 7.9 today - Iron level 14, Ferretin was 15 on 9/19 - given 1 U PRBC and a dose of IV Iron- have started oral Iron - recommend 1 more dose of Feraheme in 1 wk  Metabolic acidosis/ AKI - suspect prerenal and treated with a bicarb infusion - Cr has improved     Crohn's colitis with perforation s/p left colectomy/colostomy 2016  cont to  follow stool output to ensure we are matching her output with oral and IV fluids  Severe wounds with cellulitis and sepsis - has chronic wounds in abdomen from prior surgery and new wounds due to moisture damage to skin in skin folds/ panus and vulvar area which are full thickness wounds  - added Oxycodone and PRN Dilaudid which appears to be helping- will d/c with Oxycodone - cont Bactrim x 1 wk  Coag neg staph in 1 set of blood cultures - possibly contamination, however may be accurate in setting of extensive infected wounds- cont Bactrim  Indwelling urinary catheter - to prevent soiling of wounds  Loss of appetite/ mod protein calorie malnutrition - has not been eating much at home and has lost about 30 lbs - continue supplements    OSA on CPAP  Severe deconditioning  -meets criteria to get rehab at SNF which she is in agreement with      Discharge Exam: Vitals:   07/09/18 2306 07/10/18 0439  BP: (!) 141/71 138/76  Pulse: (!) 108 (!) 124  Resp:  17  Temp:  98.1 F (36.7 C)  SpO2:  98%   Vitals:   07/09/18 1728 07/09/18 1959 07/09/18 2306 07/10/18 0439  BP: (!) 152/87 (!) 164/70 (!) 141/71 138/76  Pulse: (!) 118 (!) 139 (!) 108 (!) 124  Resp: 18 18  17   Temp: 98.7 F (37.1 C) 98.6 F (37 C)  98.1 F (36.7 C)  TempSrc: Oral Oral  Oral  SpO2: 100% 100%  98%  Weight:      Height:        General: Pt is alert, awake, not in acute distress Cardiovascular: RRR, S1/S2 +, no rubs, no gallops Respiratory: CTA bilaterally, no wheezing, no rhonchi Abdominal: Soft, NT, ND, bowel sounds + Extremities: no edema, no cyanosis Skin: numerous ulcers on abdomen, skin folds and vulvar area   Discharge Instructions  Discharge Instructions    Diet - low sodium heart healthy   Complete by:  As directed    Increase activity slowly   Complete by:  As directed      Allergies as of 07/10/2018      Reactions   Other Shortness Of Breath, Swelling   Tree nuts    Penicillins Other (See Comments)   Unknown childhood allergy Has patient had a PCN reaction causing immediate rash, facial/tongue/throat swelling, SOB or lightheadedness with hypotension: Unknown Has patient had a PCN reaction causing severe rash involving mucus membranes or skin necrosis: Unknown Has patient had a PCN reaction that required hospitalization: Unknown Has patient had a PCN reaction occurring within the last 10 years: Unknown If all of the above answers are "NO", then may proceed with Cephalosporin use.      Medication List    STOP taking these medications   ferrous sulfate 325 (65 FE) MG tablet   Gerhardt's butt cream Crea   metoprolol tartrate 25 MG tablet Commonly known as:  LOPRESSOR   tiZANidine 4 MG tablet Commonly known as:  ZANAFLEX     TAKE these medications   acetaminophen 500 MG tablet Commonly known as:  TYLENOL Take 500 mg by mouth every 6 (six) hours as needed for mild pain, moderate pain or headache.   albuterol (2.5 MG/3ML) 0.083% nebulizer solution Commonly known as:  PROVENTIL Take 3 mLs (2.5 mg total) by nebulization 2 (two) times daily as needed for wheezing.   bisacodyl 5 MG EC tablet Commonly known as:  DULCOLAX Take 1 tablet (5 mg total) by mouth daily as needed for moderate constipation.   nutrition supplement (JUVEN) Pack Take 1 packet by mouth 2 (two) times daily between meals.   feeding supplement (ENSURE ENLIVE) Liqd Take 237 mLs by mouth daily.   feeding supplement ([name]-STAT SUGAR FREE 64) Liqd Take 30 mLs by mouth daily.   multivitamin with minerals Tabs tablet Take 1 tablet by mouth daily.   oxyCODONE 5 MG immediate release tablet Commonly known as:  Oxy IR/ROXICODONE Take 1-2 tablets (5-10 mg total) by mouth every 4 (four) hours as needed for moderate pain (5 mg for moderate pain and 10 mg for severe pain).   polyethylene glycol packet Commonly known as:  MIRALAX / GLYCOLAX Take 17 g by mouth daily as needed for mild  constipation.   sulfamethoxazole-trimethoprim 400-80 MG tablet Commonly known as:  BACTRIM,SEPTRA Take 1 tablet by mouth 2 (two) times daily.   tobramycin 0.3 % ophthalmic solution Commonly known as:  TOBREX Place 2 drops into both eyes every 6 (six) hours.       Allergies  Allergen Reactions  . Other Shortness Of Breath and Swelling    Tree nuts  . Penicillins Other (See Comments)    Unknown childhood allergy Has patient had a PCN reaction causing immediate rash, facial/tongue/throat swelling, SOB or lightheadedness with hypotension: Unknown Has patient had a PCN reaction causing severe rash involving mucus membranes or skin necrosis: Unknown Has patient had a PCN reaction that required hospitalization: Unknown Has patient had a PCN reaction occurring within the  last 10 years: Unknown If all of the above answers are "NO", then may proceed with Cephalosporin use.      Procedures/Studies:   No results found.   The results of significant diagnostics from this hospitalization (including imaging, microbiology, ancillary and laboratory) are listed below for reference.     Microbiology: Recent Results (from the past 240 hour(s))  Blood culture (routine x 2)     Status: Abnormal   Collection Time: 07/04/18  8:07 PM  Result Value Ref Range Status   Specimen Description   Final    BLOOD RIGHT ANTECUBITAL Performed at [facility] [address], [place], [place] [address]    Special Requests   Final    BOTTLES DRAWN AEROBIC AND ANAEROBIC Blood Culture results may not be optimal due to an excessive volume of blood received in culture bottles Performed at [facility] [address], [place], [place] [address]    Culture  Setup Time   Final    GRAM POSITIVE COCCI AEROBIC BOTTLE ONLY CRITICAL RESULT CALLED TO, READ BACK BY AND VERIFIED WITH: J.GRIMSLEY,PHARMD AT 0981 ON 07/06/18 BY G.MCADOO    Culture (A)  Final    STAPHYLOCOCCUS  SPECIES (COAGULASE NEGATIVE) THE SIGNIFICANCE OF ISOLATING THIS ORGANISM FROM A SINGLE SET OF BLOOD CULTURES WHEN MULTIPLE SETS ARE DRAWN IS UNCERTAIN. PLEASE NOTIFY THE MICROBIOLOGY DEPARTMENT WITHIN ONE WEEK IF SPECIATION AND SENSITIVITIES ARE REQUIRED. Performed at [facility], [place] [address] [place], [place] [address]    Report Status 07/07/2018 FINAL  Final  Blood Culture ID Panel (Reflexed)     Status: Abnormal   Collection Time: 07/04/18  8:07 PM  Result Value Ref Range Status   Enterococcus species NOT DETECTED NOT DETECTED Final   Listeria monocytogenes NOT DETECTED NOT DETECTED Final   Staphylococcus species DETECTED (A) NOT DETECTED Final    Comment: CRITICAL RESULT CALLED TO, READ BACK BY AND VERIFIED WITH: J.GRIMSLEY,PHARMD AT 8295 ON 07/06/18 BY G.MCADOO    Staphylococcus aureus (BCID) NOT DETECTED NOT DETECTED Final   Methicillin resistance DETECTED (A) NOT DETECTED Final    Comment: CRITICAL RESULT CALLED TO, READ BACK BY AND VERIFIED WITH: J.GRIMSLEY,PHARMD AT 0508 ON 07/06/18 BY G.MCADOO    Streptococcus species NOT DETECTED NOT DETECTED Final   Streptococcus agalactiae NOT DETECTED NOT DETECTED Final   Streptococcus pneumoniae NOT DETECTED NOT DETECTED Final   Streptococcus pyogenes NOT DETECTED NOT DETECTED Final   Acinetobacter baumannii NOT DETECTED NOT DETECTED Final   Enterobacteriaceae species NOT DETECTED NOT DETECTED Final   Enterobacter cloacae complex NOT DETECTED NOT DETECTED Final   Escherichia coli NOT DETECTED NOT DETECTED Final   Klebsiella oxytoca NOT DETECTED NOT DETECTED Final   Klebsiella pneumoniae NOT DETECTED NOT DETECTED Final   Proteus species NOT DETECTED NOT DETECTED Final   Serratia marcescens NOT DETECTED NOT DETECTED Final   Haemophilus influenzae NOT DETECTED NOT DETECTED Final   Neisseria meningitidis NOT DETECTED NOT DETECTED Final   Pseudomonas aeruginosa NOT DETECTED NOT DETECTED Final   Candida albicans NOT DETECTED NOT  DETECTED Final   Candida glabrata NOT DETECTED NOT DETECTED Final   Candida krusei NOT DETECTED NOT DETECTED Final   Candida parapsilosis NOT DETECTED NOT DETECTED Final   Candida tropicalis NOT DETECTED NOT DETECTED Final    Comment: Performed at [facility], [place] [address] [place], [place] [address]  Blood culture (routine x 2)     Status: None   Collection Time: 07/04/18  8:55 PM  Result Value Ref Range Status   Specimen Description   Final    BLOOD LEFT ARM Performed at [facility] [address] [place], [name] [address]    Special Requests   Final    BOTTLES DRAWN AEROBIC ONLY Blood Culture adequate volume Performed at [facility] [address], [place], [place] [address]    Culture   Final    NO GROWTH 5 DAYS Performed at [facility], [place] [address] [place], [place] [address]    Report Status 07/10/2018 FINAL  Final  Urine culture     Status: None   Collection Time: 07/05/18  5:11 AM  Result Value Ref Range Status   Specimen Description   Final    URINE, CATHETERIZED Performed at [facility] [address], [place], [place] [address]    Special Requests   Final    NONE Performed at [facility], [place] [address] [place], [place] [address]    Culture   Final    NO GROWTH Performed at [facility], [place] [address] [place], [place] [address]    Report Status 07/06/2018 FINAL  Final     Labs: BNP (last 3 results) No results for input(s): BNP in the last 8760 hours. Basic Metabolic Panel: Recent Labs  Lab 07/05/18 0522 07/06/18 0608 07/07/18 0611 07/08/18 0547 07/10/18 0530  NA 134* 138 138 139 137  K 4.4 4.5 4.9 4.0 4.6  CL 109 110 112* 110 108  CO2 15* 17* 23 23 20*  GLUCOSE 72 100* 111* 107* 91  BUN 13 9 10 10 6   CREATININE 0.96 0.93 1.06* 0.91 0.88  CALCIUM 8.3* 8.6* 8.3* 8.2* 8.5*   Liver Function Tests: Recent Labs  Lab 07/04/18 2055  AST 22   ALT 11  ALKPHOS 128*  BILITOT 0.6  PROT 7.3  ALBUMIN 2.4*   No results for input(s): LIPASE, AMYLASE in the last 168 hours. No results for input(s): AMMONIA in the last 168 hours. CBC: Recent Labs  Lab 07/04/18 2055 07/05/18 0522 07/08/18 0547 07/09/18 0943 07/09/18 2226  WBC 9.7 11.8* 8.8 8.0  --   NEUTROABS 7.4 8.7*  --   --   --   HGB 9.4* 9.3* 8.5* 7.9* 9.6*  HCT 32.6* 30.6* 29.4* 26.9* 30.8*  MCV 77.8* 74.8* 78.6* 79.4*  --   PLT 448* 434* 453* 488*  --    Cardiac Enzymes: No results for input(s): CKTOTAL, CKMB, CKMBINDEX, TROPONINI in the last 168 hours. BNP: Invalid input(s): POCBNP CBG: No results for input(s): GLUCAP in the last 168 hours. D-Dimer No results for input(s): DDIMER in the last 72 hours. Hgb A1c No results for input(s): HGBA1C in the last 72 hours. Lipid Profile No results for input(s): CHOL, HDL, LDLCALC, TRIG, CHOLHDL, LDLDIRECT in the last 72 hours. Thyroid function studies No results for input(s): TSH, T4TOTAL, T3FREE, THYROIDAB in the last 72 hours.  Invalid input(s): FREET3 Anemia work up No results for input(s): VITAMINB12, FOLATE, FERRITIN, TIBC, IRON, RETICCTPCT in the last 72 hours. Urinalysis    Component Value Date/Time   COLORURINE YELLOW 07/05/2018 0511   APPEARANCEUR CLEAR 07/05/2018 0511   LABSPEC 1.020 07/05/2018 0511   PHURINE 5.0 07/05/2018 0511   GLUCOSEU NEGATIVE 07/05/2018 0511   HGBUR NEGATIVE 07/05/2018 0511   BILIRUBINUR NEGATIVE 07/05/2018 0511   KETONESUR 5 (A) 07/05/2018 0511   PROTEINUR 30 (A) 07/05/2018 0511   UROBILINOGEN 0.2  07/22/2015 0405   NITRITE NEGATIVE 07/05/2018 0511   LEUKOCYTESUR NEGATIVE 07/05/2018 0511   Sepsis Labs Invalid input(s): PROCALCITONIN,  WBC,  LACTICIDVEN Microbiology Recent Results (from the past 240 hour(s))  Blood culture (routine x 2)     Status: Abnormal   Collection Time: 07/04/18  8:07 PM  Result Value Ref Range Status   Specimen Description   Final    BLOOD RIGHT  ANTECUBITAL Performed at [facility] [address] [place], [place] [address]    Special Requests   Final    BOTTLES DRAWN AEROBIC AND ANAEROBIC Blood Culture results may not be optimal due to an excessive volume of blood received in culture bottles Performed at [facility] [address], [place], [place] [address]    Culture  Setup Time   Final    GRAM POSITIVE COCCI AEROBIC BOTTLE ONLY CRITICAL RESULT CALLED TO, READ BACK BY AND VERIFIED WITH: J.GRIMSLEY,PHARMD AT 3212 ON 07/06/18 BY G.MCADOO    Culture (A)  Final    STAPHYLOCOCCUS SPECIES (COAGULASE NEGATIVE) THE SIGNIFICANCE OF ISOLATING THIS ORGANISM FROM A SINGLE SET OF BLOOD CULTURES WHEN MULTIPLE SETS ARE DRAWN IS UNCERTAIN. PLEASE NOTIFY THE MICROBIOLOGY DEPARTMENT WITHIN ONE WEEK IF SPECIATION AND SENSITIVITIES ARE REQUIRED. Performed at [facility], [place] [address] [place], [place] [address]    Report Status 07/07/2018 FINAL  Final  Blood Culture ID Panel (Reflexed)     Status: Abnormal   Collection Time: 07/04/18  8:07 PM  Result Value Ref Range Status   Enterococcus species NOT DETECTED NOT DETECTED Final   Listeria monocytogenes NOT DETECTED NOT DETECTED Final   Staphylococcus species DETECTED (A) NOT DETECTED Final    Comment: CRITICAL RESULT CALLED TO, READ BACK BY AND VERIFIED WITH: J.GRIMSLEY,PHARMD AT 0037 ON 07/06/18 BY G.MCADOO    Staphylococcus aureus (BCID) NOT DETECTED NOT DETECTED Final   Methicillin resistance DETECTED (A) NOT DETECTED Final    Comment: CRITICAL RESULT CALLED TO, READ BACK BY AND VERIFIED WITH: J.GRIMSLEY,PHARMD AT 0508 ON 07/06/18 BY G.MCADOO    Streptococcus species NOT DETECTED NOT DETECTED Final   Streptococcus agalactiae NOT DETECTED NOT DETECTED Final   Streptococcus pneumoniae NOT DETECTED NOT DETECTED Final   Streptococcus pyogenes NOT DETECTED NOT DETECTED Final   Acinetobacter baumannii NOT DETECTED NOT DETECTED Final    Enterobacteriaceae species NOT DETECTED NOT DETECTED Final   Enterobacter cloacae complex NOT DETECTED NOT DETECTED Final   Escherichia coli NOT DETECTED NOT DETECTED Final   Klebsiella oxytoca NOT DETECTED NOT DETECTED Final   Klebsiella pneumoniae NOT DETECTED NOT DETECTED Final   Proteus species NOT DETECTED NOT DETECTED Final   Serratia marcescens NOT DETECTED NOT DETECTED Final   Haemophilus influenzae NOT DETECTED NOT DETECTED Final   Neisseria meningitidis NOT DETECTED NOT DETECTED Final   Pseudomonas aeruginosa NOT DETECTED NOT DETECTED Final   Candida albicans NOT DETECTED NOT DETECTED Final   Candida glabrata NOT DETECTED NOT DETECTED Final   Candida krusei NOT DETECTED NOT DETECTED Final   Candida parapsilosis NOT DETECTED NOT DETECTED Final   Candida tropicalis NOT DETECTED NOT DETECTED Final    Comment: Performed at [facility], [place] [address] [place], [place] [address]  Blood culture (routine x 2)     Status: None   Collection Time: 07/04/18  8:55 PM  Result Value Ref Range Status   Specimen Description   Final    BLOOD LEFT ARM Performed at [facility], [address] [place], [place] [address]    Special Requests   Final    BOTTLES DRAWN AEROBIC ONLY Blood Culture adequate volume Performed at [facility] [address] [place], [place] [address]    Culture   Final    NO GROWTH 5 DAYS Performed at [facility], [place] [address], [place], [place] [address]    Report Status 07/10/2018 FINAL  Final  Urine culture     Status: None   Collection Time: 07/05/18  5:11 AM  Result Value Ref Range Status   Specimen Description   Final    URINE, CATHETERIZED Performed at [facility] [address], [place], [place] [address]    Special Requests   Final    NONE Performed at [facility], [place] [address], [place], [place] [address]    Culture   Final    NO GROWTH Performed at  [facility], [place] [address] [place], [place] [address]    Report Status 07/06/2018 FINAL  Final     Time coordinating discharge in minutes: 60  SIGNED:   Debbe Odea, MD  [facility] 07/10/2018, 7:31 AM Pager   If 7PM-7AM, please contact night-coverage [email or web address] Password [email or web address]

## 2018-07-10 LAB — BPAM RBC
BLOOD PRODUCT EXPIRATION DATE: 201911182359
ISSUE DATE / TIME: 201910171704
UNIT TYPE AND RH: 5100

## 2018-07-10 LAB — BASIC METABOLIC PANEL
ANION GAP: 9 (ref 5–15)
BUN: 6 mg/dL (ref 6–20)
CO2: 20 mmol/L — ABNORMAL LOW (ref 22–32)
CREATININE: 0.88 mg/dL (ref 0.44–1.00)
Calcium: 8.5 mg/dL — ABNORMAL LOW (ref 8.9–10.3)
Chloride: 108 mmol/L (ref 98–111)
Glucose, Bld: 91 mg/dL (ref 70–99)
Potassium: 4.6 mmol/L (ref 3.5–5.1)
SODIUM: 137 mmol/L (ref 135–145)

## 2018-07-10 LAB — TYPE AND SCREEN
ABO/RH(D): O POS
ANTIBODY SCREEN: NEGATIVE
UNIT DIVISION: 0

## 2018-07-10 LAB — CULTURE, BLOOD (ROUTINE X 2)
CULTURE: NO GROWTH
SPECIAL REQUESTS: ADEQUATE

## 2018-07-10 NOTE — Progress Notes (Signed)
Physical Therapy Treatment Patient Details Name: Dominique [name] MRN: [id number] DOB: Aug 17, 1981 Today's Date: 07/10/2018    History of Present Illness 37 y.o. 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 Dx of hypovolemia, sepsis, cellulitis    PT Comments    Pt ambulated within her room and assisted to recliner.  Pt began vomiting once settled in recliner.  NT and RN notified.   Follow Up Recommendations  SNF;Supervision for mobility/OOB     Equipment Recommendations  Rolling walker with 5" wheels;3in1 (PT)(bariatric)    Recommendations for Other Services       Precautions / Restrictions Precautions Precautions: Fall    Mobility  Bed Mobility Overal bed mobility: Needs Assistance Bed Mobility: Supine to Sit     Supine to sit: Min guard     General bed mobility comments: increased time, pt positions bed to self assist  Transfers Overall transfer level: Needs assistance Equipment used: Rolling walker (2 wheeled) Transfers: Sit to/from Stand Sit to Stand: Min guard         General transfer comment: verbal cues for hand placement  Ambulation/Gait Ambulation/Gait assistance: Min guard Gait Distance (Feet): 30 Feet Assistive device: Rolling walker (2 wheeled) Gait Pattern/deviations: Step-through pattern;Decreased stride length Gait velocity: decr   General Gait Details: increased lateral trunk sway likely due to body habitus, steady with RW, distance limited to room only (pt menstrating and unable to wear undergarments due to wounds); pt no longer on telemetry, pt denies any symptoms   Stairs             Wheelchair Mobility    Modified Rankin (Stroke Patients Only)       Balance                                             Cognition Arousal/Alertness: Awake/alert Behavior During Therapy: WFL for tasks assessed/performed Overall Cognitive Status: Within Functional Limits for tasks assessed                                        Exercises      General Comments        Pertinent Vitals/Pain Pain Assessment: 0-10 Pain Score: 3  Pain Location: areas receiving wound care Pain Descriptors / Indicators: Sore Pain Intervention(s): Limited activity within patient's tolerance;Repositioned;Monitored during session;Premedicated before session    Home Living                      Prior Function            PT Goals (current goals can now be found in the care plan section) Progress towards PT goals: Progressing toward goals    Frequency    Min 3X/week      PT Plan Current plan remains appropriate    Co-evaluation              AM-PAC PT "6 Clicks" Daily Activity  Outcome Measure  Difficulty turning over in bed (including adjusting bedclothes, sheets and blankets)?: A Little Difficulty moving from lying on back to sitting on the side of the bed? :  A Little Difficulty sitting down on and standing up from a chair with arms (e.g., wheelchair, bedside commode, etc,.)?: A Little Help needed moving to and from a bed to chair (including a wheelchair)?: A Little Help needed walking in hospital room?: A Little Help needed climbing 3-5 steps with a railing? : A Lot 6 Click Score: 17    End of Session   Activity Tolerance: Patient tolerated treatment well Patient left: in chair;with call bell/phone within reach Nurse Communication: Mobility status PT Visit Diagnosis: Difficulty in walking, not elsewhere classified (R26.2)     Time: 6372-9426 PT Time Calculation (min) (ACUTE ONLY): 19 min  Charges:  $Gait Training: 8-22 mins                     Carmelia Bake, PT, DPT Acute Rehabilitation Services Office: [phone] Pager: [phone]  Trena Platt 07/10/2018, 1:26 PM

## 2018-07-10 NOTE — Progress Notes (Signed)
PROGRESS NOTE    Dominique [name]   UUE:[id number]  DOB: Nov 07, 1980  DOA: 07/04/2018 PCP: Benito Mccreedy, MD   Brief Narrative:  Dominique [name] is an 37 y.o. 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 She presented to the ED for lightheaded sensation when she was sitting or standing up. HR noted to be 140s, sinus tachycardia.     Subjective: No new complaints today. Eating and drinking OK now     Assessment & Plan:   Principal Problem:   Hypovolemia/   Orthostatic hypotension/ hyponatremia -after aggressive IV hydration, orthostatic hypotension & hyponatremia have resolved- she is eating and drinking appropriately now  Active Problems: Anemia due to acute blood loss and Iron deficiency - has bleeding from wounds and her periods - Hb 9.4 >> 7.9 today - Iron level 14, Ferretin was 15 on 9/19 - 10/17>  given 1 U PRBC and a dose of IV Iron  Metabolic acidosis/ AKI - suspect prerenal - treated with IVF and Bicarb tabs - Cr has improved- Bicarb improved to 23 but now down to 20 -    Crohn's colitis with perforation s/p left colectomy/colostomy 2016  cont to follow stool output to ensure we are matching her output with oral and IV fluids  Severe wounds with cellulitis and sepsis - has chronic wounds in abdomen from prior surgery and new wounds due to moisture damage to skin in skin folds/ panus and vaginal area which are full thickness wounds  - added Oxycodone and PRN Dilaudid which appears to be helping - cont Bactrim  Indwelling urinary catheter - to prevent soiling of wounds  Loss of appetite/ mod protein calorie malnutrition - has not been eating much at home and has lost about 30 lbs - continue supplements    OSA on CPAP  Severe deconditioning  -meets criteria to get rehab at SNF which she is in agreement with     DVT prophylaxis: SCDs Code Status: Full code Family Communication:  Disposition Plan: likely will go to SNF but awaiting insurance approval Consultants:   Wound care Procedures:    Antimicrobials:  Anti-infectives (From admission, onward)   Start     Dose/Rate Route Frequency Ordered Stop   07/09/18 0000  sulfamethoxazole-trimethoprim (BACTRIM) 400-80 MG tablet     1 tablet Oral 2 times daily 07/09/18 1619     07/07/18 1000  vancomycin (VANCOCIN) 1,500 mg in sodium chloride 0.9 % 500 mL IVPB  Status:  Discontinued     1,500 mg 250 mL/hr over 120 Minutes Intravenous Every 24 hours 07/06/18 1014 07/07/18 0938   07/07/18 1000  sulfamethoxazole-trimethoprim (BACTRIM DS,SEPTRA DS) 800-160 MG per tablet 2 tablet     2 tablet Oral Every 12 hours 07/07/18 0938     07/06/18 1200  cefTRIAXone (ROCEPHIN) 2 g in sodium chloride 0.9 % 100 mL IVPB  Status:  Discontinued     2 g 200 mL/hr over 30 Minutes Intravenous Every 24 hours 07/06/18 0948 07/07/18 0938   07/06/18 1100  doxycycline (VIBRAMYCIN) 100 mg in sodium chloride 0.9 % 250 mL IVPB  Status:  Discontinued     100 mg 125 mL/hr over 120 Minutes Intravenous Every 12 hours 07/06/18 0915 07/06/18 1002   07/06/18 1015  vancomycin (VANCOCIN) 2,500 mg in sodium chloride 0.9 % 500 mL IVPB  2,500 mg 250 mL/hr over 120 Minutes Intravenous  Once 07/06/18 1014 07/07/18 0826       Objective: Vitals:   07/09/18 1959 07/09/18 2306 07/10/18 0439 07/10/18 1412  BP: (!) 164/70 (!) 141/71 138/76 (!) 143/86  Pulse: (!) 139 (!) 108 (!) 124 (!) 129  Resp: 18  17 20   Temp: 98.6 F (37 C)  98.1 F (36.7 C) 98.3 F (36.8 C)  TempSrc: Oral  Oral Oral  SpO2: 100%  98% 98%  Weight:      Height:        Intake/Output Summary (Last 24 hours) at 07/10/2018 1422 Last data filed at 07/10/2018 6378 Gross per 24 hour  Intake 2250.25 ml  Output 2450 ml  Net -199.75 ml   Filed  Weights   07/04/18 2339 07/05/18 0540 07/06/18 0504  Weight: (!) 137.6 kg (!) 137.8 kg (!) 139.5 kg    Examination: General exam: Appears comfortable - morbidly obese HEENT: PERRLA, oral mucosa moist, no sclera icterus or thrush Respiratory system: Clear to auscultation. Respiratory effort normal. Cardiovascular system: S1 & S2 heard,  No murmurs  Gastrointestinal system: Abdomen soft, non-tender, nondistended. Normal bowel sound. No organomegaly Central nervous system: Alert and oriented. No focal neurological deficits. Extremities: No cyanosis, clubbing or edema Skin: severe skin breakdown on abdomen, under skin folds and in vulva and groin Psychiatry:  Mood & affect appropriate.      Data Reviewed: I have personally reviewed following labs and imaging studies  CBC: Recent Labs  Lab 07/04/18 2055 07/05/18 0522 07/08/18 0547 07/09/18 0943 07/09/18 2226  WBC 9.7 11.8* 8.8 8.0  --   NEUTROABS 7.4 8.7*  --   --   --   HGB 9.4* 9.3* 8.5* 7.9* 9.6*  HCT 32.6* 30.6* 29.4* 26.9* 30.8*  MCV 77.8* 74.8* 78.6* 79.4*  --   PLT 448* 434* 453* 488*  --    Basic Metabolic Panel: Recent Labs  Lab 07/05/18 0522 07/06/18 0608 07/07/18 0611 07/08/18 0547 07/10/18 0530  NA 134* 138 138 139 137  K 4.4 4.5 4.9 4.0 4.6  CL 109 110 112* 110 108  CO2 15* 17* 23 23 20*  GLUCOSE 72 100* 111* 107* 91  BUN 13 9 10 10 6   CREATININE 0.96 0.93 1.06* 0.91 0.88  CALCIUM 8.3* 8.6* 8.3* 8.2* 8.5*   GFR: Estimated Creatinine Clearance: 117.5 mL/min (by C-G formula based on SCr of 0.88 mg/dL). Liver Function Tests: Recent Labs  Lab 07/04/18 2055  AST 22  ALT 11  ALKPHOS 128*  BILITOT 0.6  PROT 7.3  ALBUMIN 2.4*   No results for input(s): LIPASE, AMYLASE in the last 168 hours. No results for input(s): AMMONIA in the last 168 hours. Coagulation Profile: No results for input(s): INR, PROTIME in the last 168 hours. Cardiac Enzymes: No results for input(s): CKTOTAL, CKMB, CKMBINDEX,  TROPONINI in the last 168 hours. BNP (last 3 results) No results for input(s): PROBNP in the last 8760 hours. HbA1C: No results for input(s): HGBA1C in the last 72 hours. CBG: No results for input(s): GLUCAP in the last 168 hours. Lipid Profile: No results for input(s): CHOL, HDL, LDLCALC, TRIG, CHOLHDL, LDLDIRECT in the last 72 hours. Thyroid Function Tests: No results for input(s): TSH, T4TOTAL, FREET4, T3FREE, THYROIDAB in the last 72 hours. Anemia Panel: No results for input(s): VITAMINB12, FOLATE, FERRITIN, TIBC, IRON, RETICCTPCT in the last 72 hours. Urine analysis:    Component Value Date/Time   COLORURINE YELLOW 07/05/2018 0511   APPEARANCEUR  CLEAR 07/05/2018 0511   LABSPEC 1.020 07/05/2018 0511   PHURINE 5.0 07/05/2018 0511   GLUCOSEU NEGATIVE 07/05/2018 0511   HGBUR NEGATIVE 07/05/2018 0511   BILIRUBINUR NEGATIVE 07/05/2018 0511   KETONESUR 5 (A) 07/05/2018 0511   PROTEINUR 30 (A) 07/05/2018 0511   UROBILINOGEN 0.2 07/22/2015 0405   NITRITE NEGATIVE 07/05/2018 0511   LEUKOCYTESUR NEGATIVE 07/05/2018 0511   Sepsis Labs: @LABRCNTIP (procalcitonin:4,lacticidven:4) ) Recent Results (from the past 240 hour(s))  Blood culture (routine x 2)     Status: Abnormal   Collection Time: 07/04/18  8:07 PM  Result Value Ref Range Status   Specimen Description   Final    BLOOD RIGHT ANTECUBITAL Performed at [facility], [place] [address] [place], [place] [address]    Special Requests   Final    BOTTLES DRAWN AEROBIC AND ANAEROBIC Blood Culture results may not be optimal due to an excessive volume of blood received in culture bottles Performed at [place] [address] [place], [place] [address]    Culture  Setup Time   Final    GRAM POSITIVE COCCI AEROBIC BOTTLE ONLY CRITICAL RESULT CALLED TO, READ BACK BY AND VERIFIED WITH: J.GRIMSLEY,PHARMD AT 1941 ON 07/06/18 BY G.MCADOO    Culture (A)  Final    STAPHYLOCOCCUS SPECIES (COAGULASE  NEGATIVE) THE SIGNIFICANCE OF ISOLATING THIS ORGANISM FROM A SINGLE SET OF BLOOD CULTURES WHEN MULTIPLE SETS ARE DRAWN IS UNCERTAIN. PLEASE NOTIFY THE MICROBIOLOGY DEPARTMENT WITHIN ONE WEEK IF SPECIATION AND SENSITIVITIES ARE REQUIRED. Performed at [facility], [place] [address] [place], [place] [address]    Report Status 07/07/2018 FINAL  Final  Blood Culture ID Panel (Reflexed)     Status: Abnormal   Collection Time: 07/04/18  8:07 PM  Result Value Ref Range Status   Enterococcus species NOT DETECTED NOT DETECTED Final   Listeria monocytogenes NOT DETECTED NOT DETECTED Final   Staphylococcus species DETECTED (A) NOT DETECTED Final    Comment: CRITICAL RESULT CALLED TO, READ BACK BY AND VERIFIED WITH: J.GRIMSLEY,PHARMD AT 4481 ON 07/06/18 BY G.MCADOO    Staphylococcus aureus (BCID) NOT DETECTED NOT DETECTED Final   Methicillin resistance DETECTED (A) NOT DETECTED Final    Comment: CRITICAL RESULT CALLED TO, READ BACK BY AND VERIFIED WITH: J.GRIMSLEY,PHARMD AT 0508 ON 07/06/18 BY G.MCADOO    Streptococcus species NOT DETECTED NOT DETECTED Final   Streptococcus agalactiae NOT DETECTED NOT DETECTED Final   Streptococcus pneumoniae NOT DETECTED NOT DETECTED Final   Streptococcus pyogenes NOT DETECTED NOT DETECTED Final   Acinetobacter baumannii NOT DETECTED NOT DETECTED Final   Enterobacteriaceae species NOT DETECTED NOT DETECTED Final   Enterobacter cloacae complex NOT DETECTED NOT DETECTED Final   Escherichia coli NOT DETECTED NOT DETECTED Final   Klebsiella oxytoca NOT DETECTED NOT DETECTED Final   Klebsiella pneumoniae NOT DETECTED NOT DETECTED Final   Proteus species NOT DETECTED NOT DETECTED Final   Serratia marcescens NOT DETECTED NOT DETECTED Final   Haemophilus influenzae NOT DETECTED NOT DETECTED Final   Neisseria meningitidis NOT DETECTED NOT DETECTED Final   Pseudomonas aeruginosa NOT DETECTED NOT DETECTED Final   Candida albicans NOT DETECTED NOT DETECTED Final    Candida glabrata NOT DETECTED NOT DETECTED Final   Candida krusei NOT DETECTED NOT DETECTED Final   Candida parapsilosis NOT DETECTED NOT DETECTED Final   Candida tropicalis NOT DETECTED NOT DETECTED Final    Comment: Performed at [facility], [place] [address] [place], [place] [address]  Blood culture (routine x 2)     Status: None   Collection Time: 07/04/18  8:55 PM  Result Value Ref Range Status   Specimen Description   Final    BLOOD LEFT ARM Performed at [place] [address], [place], [place] [address]    Special Requests   Final    BOTTLES DRAWN AEROBIC ONLY Blood Culture adequate volume Performed at [place] [address], [place], [place] [address]    Culture   Final    NO GROWTH 5 DAYS Performed at [facility], [place] [address] [place], [place] [address]    Report Status 07/10/2018 FINAL  Final  Urine culture     Status: None   Collection Time: 07/05/18  5:11 AM  Result Value Ref Range Status   Specimen Description   Final    URINE, CATHETERIZED Performed at [place] [address], [place], [place] [address]    Special Requests   Final    NONE Performed at [facility], [place] [address] [place], [place] [address]    Culture   Final    NO GROWTH Performed at [facility], [place] [address], [place], [place] [address]    Report Status 07/06/2018 FINAL  Final         Radiology Studies: No results found.    Scheduled Meds: . feeding supplement (ENSURE ENLIVE)  237 mL Oral Q24H  . feeding supplement (PRO-STAT SUGAR FREE 64)  30 mL Oral Daily  . multivitamin with minerals  1 tablet Oral Daily  . nutrition supplement (JUVEN)  1 packet Oral BID BM  . sodium bicarbonate  650 mg Oral Daily  . sodium chloride flush  3 mL Intravenous Q12H  . sulfamethoxazole-trimethoprim  2 tablet Oral Q12H   Continuous Infusions:    LOS: 4 days    Time spent in  minutes: 30    Debbe Odea, MD [facility] Pager: [email or web address] Password [email or web address] 07/10/2018, 2:22 PM

## 2018-07-10 NOTE — Progress Notes (Signed)
Assumed care of this patient. Agree with previous RN assessment. Will continue to monitor patient closely.

## 2018-07-10 NOTE — Progress Notes (Signed)
CSW following for assistance with discharge planning to SNF. Patient currently has no bed offers or payor source for SNF. CSW Surveyor, quantity assisting with SNF placement for patient. CSW will continue to follow and assist with discharge planning.  Abundio Miu, [place] Social Worker [facility] Cell#: [phone]

## 2018-07-11 LAB — BASIC METABOLIC PANEL
ANION GAP: 8 (ref 5–15)
BUN: 5 mg/dL — ABNORMAL LOW (ref 6–20)
CHLORIDE: 105 mmol/L (ref 98–111)
CO2: 23 mmol/L (ref 22–32)
CREATININE: 0.87 mg/dL (ref 0.44–1.00)
Calcium: 8.4 mg/dL — ABNORMAL LOW (ref 8.9–10.3)
GFR calc non Af Amer: >60 mL/min (ref >60)
Glucose, Bld: 88 mg/dL (ref 70–99)
Potassium: 4.5 mmol/L (ref 3.5–5.1)
SODIUM: 136 mmol/L (ref 135–145)

## 2018-07-11 NOTE — Progress Notes (Signed)
PROGRESS NOTE    Dominique [name]   SWN:[id number]  DOB: 08-13-1981  DOA: 07/04/2018 PCP: Benito Mccreedy, MD   Brief Narrative:  Dominique [name] is an 36 y.o. 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 She presented to the ED for lightheaded sensation when she was sitting or standing up. HR noted to be 140s, sinus tachycardia.     Subjective: No complaints. Beginning to ambulate.     Assessment & Plan:   Principal Problem:   Hypovolemia/   Orthostatic hypotension/ hyponatremia -after aggressive IV hydration, orthostatic hypotension & hyponatremia have resolved- she is eating and drinking appropriately now  Active Problems: Anemia due to acute blood loss and Iron deficiency - has bleeding from wounds and her periods - Hb 9.4 >> 7.9 today - Iron level 14, Ferretin was 15 on 9/19 - 10/17>  given 1 U PRBC and a dose of IV Iron  Metabolic acidosis/ AKI - suspect prerenal - treated with IVF and Bicarb tabs - Cr has improved- Bicarb improved to 23     Crohn's colitis with perforation s/p left colectomy/colostomy 2016  cont to follow stool output to ensure we are matching her output with oral and IV fluids  Severe wounds with cellulitis and sepsis - has chronic wounds in abdomen from prior surgery and new wounds due to moisture damage to skin in skin folds/ panus and vaginal area which are full thickness wounds  - added Oxycodone and PRN Dilaudid which appears to be helping - cont Bactrim  Indwelling urinary catheter - to prevent soiling of wounds  Loss of appetite/ mod protein calorie malnutrition - has not been eating much at home and has lost about 30 lbs - continue supplements    OSA on CPAP  Severe deconditioning  -meets criteria to get rehab at  SNF which she is in agreement with     DVT prophylaxis: SCDs Code Status: Full code Family Communication:  Disposition Plan: likely will go to SNF but awaiting insurance approval Consultants:   Wound care Procedures:    Antimicrobials:  Anti-infectives (From admission, onward)   Start     Dose/Rate Route Frequency Ordered Stop   07/09/18 0000  sulfamethoxazole-trimethoprim (BACTRIM) 400-80 MG tablet     1 tablet Oral 2 times daily 07/09/18 1619     07/07/18 1000  vancomycin (VANCOCIN) 1,500 mg in sodium chloride 0.9 % 500 mL IVPB  Status:  Discontinued     1,500 mg 250 mL/hr over 120 Minutes Intravenous Every 24 hours 07/06/18 1014 07/07/18 0938   07/07/18 1000  sulfamethoxazole-trimethoprim (BACTRIM DS,SEPTRA DS) 800-160 MG per tablet 2 tablet     2 tablet Oral Every 12 hours 07/07/18 0938     07/06/18 1200  cefTRIAXone (ROCEPHIN) 2 g in sodium chloride 0.9 % 100 mL IVPB  Status:  Discontinued     2 g 200 mL/hr over 30 Minutes Intravenous Every 24 hours 07/06/18 0948 07/07/18 0938   07/06/18 1100  doxycycline (VIBRAMYCIN) 100 mg in sodium chloride 0.9 % 250 mL IVPB  Status:  Discontinued     100 mg 125 mL/hr over 120 Minutes Intravenous Every 12 hours 07/06/18 0915 07/06/18 1002   07/06/18 1015  vancomycin (VANCOCIN) 2,500 mg in sodium chloride 0.9 % 500 mL IVPB     2,500 mg 250 mL/hr over  120 Minutes Intravenous  Once 07/06/18 1014 07/07/18 0826       Objective: Vitals:   07/10/18 0439 07/10/18 1412 07/10/18 2111 07/11/18 0429  BP: 138/76 (!) 143/86 134/73 128/74  Pulse: (!) 124 (!) 129 (!) 129 (!) 130  Resp: 17 20 18 20   Temp: 98.1 F (36.7 C) 98.3 F (36.8 C) 98.2 F (36.8 C) 97.8 F (36.6 C)  TempSrc: Oral Oral Oral Oral  SpO2: 98% 98% 100% 99%  Weight:      Height:        Intake/Output Summary (Last 24 hours) at 07/11/2018 1101 Last data filed at 07/11/2018 1043 Gross per 24 hour  Intake 120 ml  Output 2725 ml  Net -2605 ml   Filed Weights   07/04/18  2339 07/05/18 0540 07/06/18 0504  Weight: (!) 137.6 kg (!) 137.8 kg (!) 139.5 kg    Examination: General exam: Appears comfortable  HEENT: PERRLA, oral mucosa moist, no sclera icterus or thrush Respiratory system: Clear to auscultation. Respiratory effort normal. Cardiovascular system: S1 & S2 heard,  No murmurs  Gastrointestinal system: Abdomen soft, non-tender, nondistended. Normal bowel sound. No organomegaly Central nervous system: Alert and oriented. No focal neurological deficits. Extremities: No cyanosis, clubbing or edema Psychiatry:  Mood & affect appropriate.      Data Reviewed: I have personally reviewed following labs and imaging studies  CBC: Recent Labs  Lab 07/04/18 2055 07/05/18 0522 07/08/18 0547 07/09/18 0943 07/09/18 2226  WBC 9.7 11.8* 8.8 8.0  --   NEUTROABS 7.4 8.7*  --   --   --   HGB 9.4* 9.3* 8.5* 7.9* 9.6*  HCT 32.6* 30.6* 29.4* 26.9* 30.8*  MCV 77.8* 74.8* 78.6* 79.4*  --   PLT 448* 434* 453* 488*  --    Basic Metabolic Panel: Recent Labs  Lab 07/06/18 0608 07/07/18 0611 07/08/18 0547 07/10/18 0530 07/11/18 0532  NA 138 138 139 137 136  K 4.5 4.9 4.0 4.6 4.5  CL 110 112* 110 108 105  CO2 17* 23 23 20* 23  GLUCOSE 100* 111* 107* 91 88  BUN 9 10 10 6  5*  CREATININE 0.93 1.06* 0.91 0.88 0.87  CALCIUM 8.6* 8.3* 8.2* 8.5* 8.4*   GFR: Estimated Creatinine Clearance: 118.9 mL/min (by C-G formula based on SCr of 0.87 mg/dL). Liver Function Tests: Recent Labs  Lab 07/04/18 2055  AST 22  ALT 11  ALKPHOS 128*  BILITOT 0.6  PROT 7.3  ALBUMIN 2.4*   No results for input(s): LIPASE, AMYLASE in the last 168 hours. No results for input(s): AMMONIA in the last 168 hours. Coagulation Profile: No results for input(s): INR, PROTIME in the last 168 hours. Cardiac Enzymes: No results for input(s): CKTOTAL, CKMB, CKMBINDEX, TROPONINI in the last 168 hours. BNP (last 3 results) No results for input(s): PROBNP in the last 8760  hours. HbA1C: No results for input(s): HGBA1C in the last 72 hours. CBG: No results for input(s): GLUCAP in the last 168 hours. Lipid Profile: No results for input(s): CHOL, HDL, LDLCALC, TRIG, CHOLHDL, LDLDIRECT in the last 72 hours. Thyroid Function Tests: No results for input(s): TSH, T4TOTAL, FREET4, T3FREE, THYROIDAB in the last 72 hours. Anemia Panel: No results for input(s): VITAMINB12, FOLATE, FERRITIN, TIBC, IRON, RETICCTPCT in the last 72 hours. Urine analysis:    Component Value Date/Time   COLORURINE YELLOW 07/05/2018 [place] 07/05/2018 0511   LABSPEC 1.020 07/05/2018 0511   PHURINE 5.0 07/05/2018 0511   GLUCOSEU NEGATIVE 07/05/2018  [place] 07/05/2018 [place] 07/05/2018 0511   KETONESUR 5 (A) 07/05/2018 0511   PROTEINUR 30 (A) 07/05/2018 0511   UROBILINOGEN 0.2 07/22/2015 0405   NITRITE NEGATIVE 07/05/2018 0511   LEUKOCYTESUR NEGATIVE 07/05/2018 0511   Sepsis Labs: @LABRCNTIP (procalcitonin:4,lacticidven:4) ) Recent Results (from the past 240 hour(s))  Blood culture (routine x 2)     Status: Abnormal   Collection Time: 07/04/18  8:07 PM  Result Value Ref Range Status   Specimen Description   Final    BLOOD RIGHT ANTECUBITAL Performed at [facility], [place] [address], [place], [place] [address]    Special Requests   Final    BOTTLES DRAWN AEROBIC AND ANAEROBIC Blood Culture results may not be optimal due to an excessive volume of blood received in culture bottles Performed at [place] [address], [place], [place] [address]    Culture  Setup Time   Final    GRAM POSITIVE COCCI AEROBIC BOTTLE ONLY CRITICAL RESULT CALLED TO, READ BACK BY AND VERIFIED WITH: J.GRIMSLEY,PHARMD AT 9030 ON 07/06/18 BY G.MCADOO    Culture (A)  Final    STAPHYLOCOCCUS SPECIES (COAGULASE NEGATIVE) THE SIGNIFICANCE OF ISOLATING THIS ORGANISM FROM A SINGLE SET OF BLOOD CULTURES WHEN MULTIPLE  SETS ARE DRAWN IS UNCERTAIN. PLEASE NOTIFY THE MICROBIOLOGY DEPARTMENT WITHIN ONE WEEK IF SPECIATION AND SENSITIVITIES ARE REQUIRED. Performed at [facility], [place] [address] [place], [place] [address]    Report Status 07/07/2018 FINAL  Final  Blood Culture ID Panel (Reflexed)     Status: Abnormal   Collection Time: 07/04/18  8:07 PM  Result Value Ref Range Status   Enterococcus species NOT DETECTED NOT DETECTED Final   Listeria monocytogenes NOT DETECTED NOT DETECTED Final   Staphylococcus species DETECTED (A) NOT DETECTED Final    Comment: CRITICAL RESULT CALLED TO, READ BACK BY AND VERIFIED WITH: J.GRIMSLEY,PHARMD AT 0076 ON 07/06/18 BY G.MCADOO    Staphylococcus aureus (BCID) NOT DETECTED NOT DETECTED Final   Methicillin resistance DETECTED (A) NOT DETECTED Final    Comment: CRITICAL RESULT CALLED TO, READ BACK BY AND VERIFIED WITH: J.GRIMSLEY,PHARMD AT 0508 ON 07/06/18 BY G.MCADOO    Streptococcus species NOT DETECTED NOT DETECTED Final   Streptococcus agalactiae NOT DETECTED NOT DETECTED Final   Streptococcus pneumoniae NOT DETECTED NOT DETECTED Final   Streptococcus pyogenes NOT DETECTED NOT DETECTED Final   Acinetobacter baumannii NOT DETECTED NOT DETECTED Final   Enterobacteriaceae species NOT DETECTED NOT DETECTED Final   Enterobacter cloacae complex NOT DETECTED NOT DETECTED Final   Escherichia coli NOT DETECTED NOT DETECTED Final   Klebsiella oxytoca NOT DETECTED NOT DETECTED Final   Klebsiella pneumoniae NOT DETECTED NOT DETECTED Final   Proteus species NOT DETECTED NOT DETECTED Final   Serratia marcescens NOT DETECTED NOT DETECTED Final   Haemophilus influenzae NOT DETECTED NOT DETECTED Final   Neisseria meningitidis NOT DETECTED NOT DETECTED Final   Pseudomonas aeruginosa NOT DETECTED NOT DETECTED Final   Candida albicans NOT DETECTED NOT DETECTED Final   Candida glabrata NOT DETECTED NOT DETECTED Final   Candida krusei NOT DETECTED NOT DETECTED Final    Candida parapsilosis NOT DETECTED NOT DETECTED Final   Candida tropicalis NOT DETECTED NOT DETECTED Final    Comment: Performed at [facility], [place] [address] [place], [place] [address]  Blood culture (routine x 2)     Status: None   Collection Time: 07/04/18  8:55 PM  Result Value Ref Range Status   Specimen Description   Final    BLOOD LEFT ARM Performed at [place] [address], [place], [place] [address]    Special Requests   Final    BOTTLES DRAWN AEROBIC ONLY Blood Culture adequate volume Performed at [place] [address] [place], [place] [address]    Culture   Final    NO GROWTH 5 DAYS Performed at [facility], [place] [address] [place], [place] [address]    Report Status 07/10/2018 FINAL  Final  Urine culture     Status: None   Collection Time: 07/05/18  5:11 AM  Result Value Ref Range Status   Specimen Description   Final    URINE, CATHETERIZED Performed at [place] [address], [place], [place] [address]    Special Requests   Final    NONE Performed at [facility], [place] [address] [place], [place] [address]    Culture   Final    NO GROWTH Performed at [facility], [place] [address], [place], [place] [address]    Report Status 07/06/2018 FINAL  Final         Radiology Studies: No results found.    Scheduled Meds: . feeding supplement (ENSURE ENLIVE)  237 mL Oral Q24H  . feeding supplement (PRO-STAT SUGAR FREE 64)  30 mL Oral Daily  . multivitamin with minerals  1 tablet Oral Daily  . nutrition supplement (JUVEN)  1 packet Oral BID BM  . sodium bicarbonate  650 mg Oral Daily  . sodium chloride flush  3 mL Intravenous Q12H  . sulfamethoxazole-trimethoprim  2 tablet Oral Q12H   Continuous Infusions:    LOS: 5 days    Time spent in minutes: 30    Debbe Odea, MD [facility] Pager: [email or web address] Password  [email or web address] 07/11/2018, 11:01 AM

## 2018-07-12 DIAGNOSIS — E860 Dehydration: Secondary | ICD-10-CM

## 2018-07-12 MED ORDER — FERROUS GLUCONATE 324 (38 FE) MG PO TABS
324.0000 mg | ORAL_TABLET | Freq: Two times a day (BID) | ORAL | Status: DC
  Administered 2018-07-13 – 2018-08-24 (×83): 324 mg via ORAL
  Filled 2018-07-12 (×91): qty 1

## 2018-07-12 NOTE — Progress Notes (Signed)
PROGRESS NOTE    Dominique [name]   ZYS:[id number]  DOB: 1981-02-11  DOA: 07/04/2018 PCP: Benito Mccreedy, MD   Brief Narrative:  Dominique [name] is an 37 y.o. 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 She presented to the ED for lightheaded sensation when she was sitting or standing up. HR noted to be 140s, sinus tachycardia.     Subjective: No complaints today.     Assessment & Plan:   Principal Problem:   Hypovolemia/   Orthostatic hypotension/ hyponatremia -after aggressive IV hydration, orthostatic hypotension & hyponatremia have resolved- she is eating and drinking appropriately now  Active Problems: Anemia due to acute blood loss and Iron deficiency - has bleeding from wounds and her periods - Hb dropped from 9.4 >> 7.9   - Iron level 14, Ferretin was 15 on 9/19 - 10/17>  given 1 U PRBC and a dose of IV Iron - started oral Iron - Hb now ~ 9  Metabolic acidosis/ AKI - suspect prerenal - treated with IVF and Bicarb tabs - Cr has improved- Bicarb improved to 23     Crohn's colitis with perforation s/p left colectomy/colostomy 2016  cont to follow stool output to ensure we are matching her output with oral and IV fluids  Severe wounds with cellulitis and sepsis - has chronic wounds in abdomen from prior surgery and new wounds due to moisture damage to skin in skin folds/ panus and vaginal area which are full thickness wounds  - added Oxycodone and PRN Dilaudid which appears to be helping - cont Bactrim  Indwelling urinary catheter - to prevent soiling of wounds  Loss of appetite/ mod protein calorie malnutrition - has not been eating much at home and has lost about 30 lbs - continue supplements    OSA on CPAP  Severe deconditioning  -meets  criteria to get rehab at SNF which she is in agreement with     DVT prophylaxis: SCDs Code Status: Full code Family Communication:  Disposition Plan: likely will go to SNF but awaiting insurance approval Consultants:   Wound care Procedures:    Antimicrobials:  Anti-infectives (From admission, onward)   Start     Dose/Rate Route Frequency Ordered Stop   07/09/18 0000  sulfamethoxazole-trimethoprim (BACTRIM) 400-80 MG tablet     1 tablet Oral 2 times daily 07/09/18 1619     07/07/18 1000  vancomycin (VANCOCIN) 1,500 mg in sodium chloride 0.9 % 500 mL IVPB  Status:  Discontinued     1,500 mg 250 mL/hr over 120 Minutes Intravenous Every 24 hours 07/06/18 1014 07/07/18 0938   07/07/18 1000  sulfamethoxazole-trimethoprim (BACTRIM DS,SEPTRA DS) 800-160 MG per tablet 2 tablet     2 tablet Oral Every 12 hours 07/07/18 0938     07/06/18 1200  cefTRIAXone (ROCEPHIN) 2 g in sodium chloride 0.9 % 100 mL IVPB  Status:  Discontinued     2 g 200 mL/hr over 30 Minutes Intravenous Every 24 hours 07/06/18 0948 07/07/18 0938   07/06/18 1100  doxycycline (VIBRAMYCIN) 100 mg in sodium chloride 0.9 % 250 mL IVPB  Status:  Discontinued     100 mg 125 mL/hr over 120 Minutes Intravenous Every 12 hours 07/06/18 0915 07/06/18 1002   07/06/18 1015  vancomycin (VANCOCIN) 2,500 mg in sodium chloride 0.9 % 500 mL  IVPB     2,500 mg 250 mL/hr over 120 Minutes Intravenous  Once 07/06/18 1014 07/07/18 0826       Objective: Vitals:   07/11/18 1348 07/11/18 1600 07/11/18 2039 07/12/18 0501  BP: 135/77  137/74 138/68  Pulse: (!) 135 (!) 130 (!) 134 (!) 124  Resp: 18  16 14   Temp: 98.1 F (36.7 C)  98.2 F (36.8 C) 98 F (36.7 C)  TempSrc: Oral  Oral Oral  SpO2: 92%  100% 97%  Weight:      Height:        Intake/Output Summary (Last 24 hours) at 07/12/2018 1031 Last data filed at 07/12/2018 0600 Gross per 24 hour  Intake 240 ml  Output 2375 ml  Net -2135 ml   Filed Weights   07/04/18 2339  07/05/18 0540 07/06/18 0504  Weight: (!) 137.6 kg (!) 137.8 kg (!) 139.5 kg    Examination: General exam: Appears comfortable  HEENT: PERRLA, oral mucosa moist, no sclera icterus or thrush Respiratory system: Clear to auscultation. Respiratory effort normal. Cardiovascular system: S1 & S2 heard,  No murmurs  Gastrointestinal system: Abdomen soft, non-tender, nondistended. Normal bowel sound. No organomegaly Central nervous system: Alert and oriented. No focal neurological deficits. Extremities: No cyanosis, clubbing or edema Skin: numerous full thickness wounds on abdomen and skin folds with foul smelling discharge.  Psychiatry:  Mood & affect appropriate.       Data Reviewed: I have personally reviewed following labs and imaging studies  CBC: Recent Labs  Lab 07/08/18 0547 07/09/18 0943 07/09/18 2226  WBC 8.8 8.0  --   HGB 8.5* 7.9* 9.6*  HCT 29.4* 26.9* 30.8*  MCV 78.6* 79.4*  --   PLT 453* 488*  --    Basic Metabolic Panel: Recent Labs  Lab 07/06/18 0608 07/07/18 0611 07/08/18 0547 07/10/18 0530 07/11/18 0532  NA 138 138 139 137 136  K 4.5 4.9 4.0 4.6 4.5  CL 110 112* 110 108 105  CO2 17* 23 23 20* 23  GLUCOSE 100* 111* 107* 91 88  BUN 9 10 10 6  5*  CREATININE 0.93 1.06* 0.91 0.88 0.87  CALCIUM 8.6* 8.3* 8.2* 8.5* 8.4*   GFR: Estimated Creatinine Clearance: 118.9 mL/min (by C-G formula based on SCr of 0.87 mg/dL). Liver Function Tests: No results for input(s): AST, ALT, ALKPHOS, BILITOT, PROT, ALBUMIN in the last 168 hours. No results for input(s): LIPASE, AMYLASE in the last 168 hours. No results for input(s): AMMONIA in the last 168 hours. Coagulation Profile: No results for input(s): INR, PROTIME in the last 168 hours. Cardiac Enzymes: No results for input(s): CKTOTAL, CKMB, CKMBINDEX, TROPONINI in the last 168 hours. BNP (last 3 results) No results for input(s): PROBNP in the last 8760 hours. HbA1C: No results for input(s): HGBA1C in the last 72  hours. CBG: No results for input(s): GLUCAP in the last 168 hours. Lipid Profile: No results for input(s): CHOL, HDL, LDLCALC, TRIG, CHOLHDL, LDLDIRECT in the last 72 hours. Thyroid Function Tests: No results for input(s): TSH, T4TOTAL, FREET4, T3FREE, THYROIDAB in the last 72 hours. Anemia Panel: No results for input(s): VITAMINB12, FOLATE, FERRITIN, TIBC, IRON, RETICCTPCT in the last 72 hours. Urine analysis:    Component Value Date/Time   COLORURINE YELLOW 07/05/2018 [place] 07/05/2018 0511   LABSPEC 1.020 07/05/2018 0511   PHURINE 5.0 07/05/2018 0511   GLUCOSEU NEGATIVE 07/05/2018 0511   HGBUR NEGATIVE 07/05/2018 [place] NEGATIVE 07/05/2018 4081   Benjamin Stain  5 (A) 07/05/2018 0511   PROTEINUR 30 (A) 07/05/2018 0511   UROBILINOGEN 0.2 07/22/2015 0405   NITRITE NEGATIVE 07/05/2018 0511   LEUKOCYTESUR NEGATIVE 07/05/2018 0511   Sepsis Labs: @LABRCNTIP (procalcitonin:4,lacticidven:4) ) Recent Results (from the past 240 hour(s))  Blood culture (routine x 2)     Status: Abnormal   Collection Time: 07/04/18  8:07 PM  Result Value Ref Range Status   Specimen Description   Final    BLOOD RIGHT ANTECUBITAL Performed at [facility] [address], [place], [place] [address]    Special Requests   Final    BOTTLES DRAWN AEROBIC AND ANAEROBIC Blood Culture results may not be optimal due to an excessive volume of blood received in culture bottles Performed at [facility] [address], [place], [place] [address]    Culture  Setup Time   Final    GRAM POSITIVE COCCI AEROBIC BOTTLE ONLY CRITICAL RESULT CALLED TO, READ BACK BY AND VERIFIED WITH: J.GRIMSLEY,PHARMD AT 9030 ON 07/06/18 BY G.[email or web address]    Culture (A)  Final    STAPHYLOCOCCUS SPECIES (COAGULASE NEGATIVE) THE SIGNIFICANCE OF ISOLATING THIS ORGANISM FROM A SINGLE SET OF BLOOD CULTURES WHEN MULTIPLE SETS ARE DRAWN IS UNCERTAIN. PLEASE NOTIFY THE MICROBIOLOGY  DEPARTMENT WITHIN ONE WEEK IF SPECIATION AND SENSITIVITIES ARE REQUIRED. Performed at [facility], [place] [address], [place], [place] [address]    Report Status 07/07/2018 FINAL  Final  Blood Culture ID Panel (Reflexed)     Status: Abnormal   Collection Time: 07/04/18  8:07 PM  Result Value Ref Range Status   Enterococcus species NOT DETECTED NOT DETECTED Final   Listeria monocytogenes NOT DETECTED NOT DETECTED Final   Staphylococcus species DETECTED (A) NOT DETECTED Final    Comment: CRITICAL RESULT CALLED TO, READ BACK BY AND VERIFIED WITH: J.GRIMSLEY,PHARMD AT 0076 ON 07/06/18 BY G.[email or web address]    Staphylococcus aureus (BCID) NOT DETECTED NOT DETECTED Final   Methicillin resistance DETECTED (A) NOT DETECTED Final    Comment: CRITICAL RESULT CALLED TO, READ BACK BY AND VERIFIED WITH: J.GRIMSLEY,PHARMD AT 0508 ON 07/06/18 BY G.MCADOO    Streptococcus species NOT DETECTED NOT DETECTED Final   Streptococcus agalactiae NOT DETECTED NOT DETECTED Final   Streptococcus pneumoniae NOT DETECTED NOT DETECTED Final   Streptococcus pyogenes NOT DETECTED NOT DETECTED Final   Acinetobacter baumannii NOT DETECTED NOT DETECTED Final   Enterobacteriaceae species NOT DETECTED NOT DETECTED Final   Enterobacter cloacae complex NOT DETECTED NOT DETECTED Final   Escherichia coli NOT DETECTED NOT DETECTED Final   Klebsiella oxytoca NOT DETECTED NOT DETECTED Final   Klebsiella pneumoniae NOT DETECTED NOT DETECTED Final   Proteus species NOT DETECTED NOT DETECTED Final   Serratia marcescens NOT DETECTED NOT DETECTED Final   Haemophilus influenzae NOT DETECTED NOT DETECTED Final   Neisseria meningitidis NOT DETECTED NOT DETECTED Final   Pseudomonas aeruginosa NOT DETECTED NOT DETECTED Final   Candida albicans NOT DETECTED NOT DETECTED Final   Candida glabrata NOT DETECTED NOT DETECTED Final   Candida krusei NOT DETECTED NOT DETECTED Final   Candida parapsilosis NOT DETECTED NOT DETECTED Final   Candida  tropicalis NOT DETECTED NOT DETECTED Final    Comment: Performed at [facility], [place] [address], [place], [place] [address]  Blood culture (routine x 2)     Status: None   Collection Time: 07/04/18  8:55 PM  Result Value Ref Range Status   Specimen Description   Final    BLOOD  LEFT ARM Performed at [facility], [place] [address], [place], [place] [address]    Special Requests   Final    BOTTLES DRAWN AEROBIC ONLY Blood Culture adequate volume Performed at [facility] [address], [place], [place] [address]    Culture   Final    NO GROWTH 5 DAYS Performed at [facility], [place] [address] [place], [place] [address]    Report Status 07/10/2018 FINAL  Final  Urine culture     Status: None   Collection Time: 07/05/18  5:11 AM  Result Value Ref Range Status   Specimen Description   Final    URINE, CATHETERIZED Performed at [facility] [address] [place], [place] [address]    Special Requests   Final    NONE Performed at [facility], [place] [address] [place], [place] [address]    Culture   Final    NO GROWTH Performed at [facility], [place] [address], [place], [place] [address]    Report Status 07/06/2018 FINAL  Final         Radiology Studies: No results found.    Scheduled Meds: . feeding supplement (ENSURE ENLIVE)  237 mL Oral Q24H  . feeding supplement (PRO-STAT SUGAR FREE 64)  30 mL Oral Daily  . multivitamin with minerals  1 tablet Oral Daily  . nutrition supplement (JUVEN)  1 packet Oral BID BM  . sodium bicarbonate  650 mg Oral Daily  . sodium chloride flush  3 mL Intravenous Q12H  . sulfamethoxazole-trimethoprim  2 tablet Oral Q12H   Continuous Infusions:    LOS: 6 days    Time spent in minutes: 30    Debbe Odea, MD [facility] Pager: [email or web address] Password [email or web address] 07/12/2018, 10:31 AM

## 2018-07-12 NOTE — Progress Notes (Signed)
Patient refuses CPAP 

## 2018-07-12 NOTE — Progress Notes (Signed)
Patient up to chair for bath.  While changing patient's dressings and cleaning wounds in patient's buttocks, a moderate amount of greenish-tan discharge was continuously dripping from patient's bottom.  Unable to tell if it was from rectum or from wound due to patient's body habitus.  Patient was unaware of discharge and unable to tell where it had originated.  Patient was cleaned and dressings placed per orders.  Will continue to monitor.

## 2018-07-13 NOTE — Progress Notes (Signed)
Physical Therapy Treatment Patient Details Name: Dominique [name] MRN: [id number] DOB: 04-Jun-1981 Today's Date: 07/13/2018    History of Present Illness 37 y.o. 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 Dx of hypovolemia, sepsis, cellulitis    PT Comments    Assisted OOB to amb a limited distance.  General bed mobility comments: increased time x 3 to complete, segmental  pt positions bed to self assist (remote and rails)  General transfer comment: pt uses forward momentum to shift body weight during sit to stand   General Gait Details: decreased amb distance this session due to increased c/o weakness, fatigue, poor energy Pt will need ST Rehab at SNF prior to returning home      Follow Up Recommendations  SNF;Supervision for mobility/OOB     Equipment Recommendations  Rolling walker with 5" wheels;3in1 (PT)    Recommendations for Other Services       Precautions / Restrictions Precautions Precautions: Fall Precaution Comments: colostomy Restrictions Weight Bearing Restrictions: No    Mobility  Bed Mobility Overal bed mobility: Needs Assistance Bed Mobility: Supine to Sit     Supine to sit: Min guard     General bed mobility comments: increased time x 3 to complete, segmental  pt positions bed to self assist (remote and rails)  Transfers Overall transfer level: Needs assistance Equipment used: Rolling walker (2 wheeled) Transfers: Sit to/from Stand Sit to Stand: Min guard Stand pivot transfers: Min assist;From elevated surface       General transfer comment: pt uses forward momentum to shift body weight during sit to stand   Ambulation/Gait Ambulation/Gait assistance: Min guard Gait Distance (Feet): 12 Feet Assistive device: Rolling walker  (2 wheeled) Gait Pattern/deviations: Step-through pattern;Decreased stride length Gait velocity: decr   General Gait Details: decreased amb distance this session due to increased c/o weakness, fatigue, poor energy   Stairs             Wheelchair Mobility    Modified Rankin (Stroke Patients Only)       Balance                                            Cognition Arousal/Alertness: Awake/alert Behavior During Therapy: WFL for tasks assessed/performed Overall Cognitive Status: Within Functional Limits for tasks assessed                                        Exercises      General Comments        Pertinent Vitals/Pain Pain Assessment: Faces Faces Pain Scale: Hurts a little bit Pain Location: inner thigh wounds Pain Descriptors / Indicators: Burning;Sore Pain Intervention(s): Monitored during session    Home Living                      Prior Function            PT Goals (current goals can now be found in the care plan section) Progress towards PT goals: Progressing toward goals    Frequency    Min 3X/week      PT Plan Current  plan remains appropriate    Co-evaluation              AM-PAC PT "6 Clicks" Daily Activity  Outcome Measure  Difficulty turning over in bed (including adjusting bedclothes, sheets and blankets)?: A Lot Difficulty moving from lying on back to sitting on the side of the bed? : A Lot Difficulty sitting down on and standing up from a chair with arms (e.g., wheelchair, bedside commode, etc,.)?: A Lot Help needed moving to and from a bed to chair (including a wheelchair)?: A Lot Help needed walking in hospital room?: A Lot Help needed climbing 3-5 steps with a railing? : Total 6 Click Score: 11    End of Session Equipment Utilized During Treatment: Gait belt Activity Tolerance: Patient limited by fatigue Patient left: in chair;with call bell/phone within reach Nurse  Communication: Mobility status(RN observed) PT Visit Diagnosis: Difficulty in walking, not elsewhere classified (R26.2)     Time: 1533-1600 PT Time Calculation (min) (ACUTE ONLY): 27 min  Charges:  $Gait Training: 8-22 mins $Therapeutic Activity: 8-22 mins                     Rica Koyanagi  PTA Acute  Rehabilitation Services Pager      [phone] Office      [phone]

## 2018-07-13 NOTE — Progress Notes (Signed)
PROGRESS NOTE    Dominique [name]   JAS:[id number]  DOB: [birth year]  DOA: 07/04/2018 PCP: Benito Mccreedy, MD   Brief Narrative:  Dominique [name] is an 37 y.o. 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 She presented to the ED for lightheaded sensation when she was sitting or standing up. HR noted to be 140s, sinus tachycardia.     Subjective: Having mild pain in her wounds. No other complaints.     Assessment & Plan:   Principal Problem:   Hypovolemia/   Orthostatic hypotension/ hyponatremia -after aggressive IV hydration, orthostatic hypotension & hyponatremia have resolved- she is eating and drinking appropriately now  Active Problems: Anemia due to acute blood loss and Iron deficiency - has bleeding from wounds and her periods - Hb dropped from 9.4 >> 7.9   - Iron level 14, Ferretin was 15 on 9/19 - 10/17>  given 1 U PRBC and a dose of IV Iron - started oral Iron - Hb now ~ 9  Metabolic acidosis/ AKI - suspect prerenal - treated with IVF and Bicarb tabs - Cr has improved- Bicarb improved to 23     Crohn's colitis with perforation s/p left colectomy/colostomy 2016  cont to follow stool output to ensure we are matching her output with oral and IV fluids  Severe wounds with cellulitis and sepsis - has chronic wounds in abdomen from prior surgery and new wounds due to moisture damage to skin in skin folds/ panus and vaginal area which are full thickness wounds  - added Oxycodone and PRN Dilaudid which appears to be helping - on Bactrim- today is day 7- will d/c today  Indwelling urinary catheter - to prevent soiling of wounds  Loss of appetite/ mod protein calorie malnutrition - has not been eating much at home and has lost about 30 lbs - continue  supplements    OSA on CPAP  Severe deconditioning  -meets criteria to get rehab at SNF which she is in agreement with but is not getting insurance approval- will see if CIR is willing to take her    DVT prophylaxis: SCDs Code Status: Full code Family Communication:  Disposition Plan:  SNF but awaiting insurance approval Consultants:   Wound care Procedures:    Antimicrobials:  Anti-infectives (From admission, onward)   Start     Dose/Rate Route Frequency Ordered Stop   07/09/18 0000  sulfamethoxazole-trimethoprim (BACTRIM) 400-80 MG tablet     1 tablet Oral 2 times daily 07/09/18 1619     07/07/18 1000  vancomycin (VANCOCIN) 1,500 mg in sodium chloride 0.9 % 500 mL IVPB  Status:  Discontinued     1,500 mg 250 mL/hr over 120 Minutes Intravenous Every 24 hours 07/06/18 1014 07/07/18 0938   07/07/18 1000  sulfamethoxazole-trimethoprim (BACTRIM DS,SEPTRA DS) 800-160 MG per tablet 2 tablet     2 tablet Oral Every 12 hours 07/07/18 0938     07/06/18 1200  cefTRIAXone (ROCEPHIN) 2 g in sodium chloride 0.9 % 100 mL IVPB  Status:  Discontinued     2 g 200 mL/hr over 30 Minutes Intravenous Every 24 hours 07/06/18 0948 07/07/18 0938   07/06/18 1100  doxycycline (VIBRAMYCIN) 100 mg in sodium chloride 0.9 % 250 mL IVPB  Status:  Discontinued     100 mg 125 mL/hr over 120 Minutes  Intravenous Every 12 hours 07/06/18 0915 07/06/18 1002   07/06/18 1015  vancomycin (VANCOCIN) 2,500 mg in sodium chloride 0.9 % 500 mL IVPB     2,500 mg 250 mL/hr over 120 Minutes Intravenous  Once 07/06/18 1014 07/07/18 0826       Objective: Vitals:   07/12/18 1327 07/12/18 2142 07/13/18 0637 07/13/18 0649  BP: 117/74 125/69 134/63   Pulse: (!) 125 (!) 123 (!) 123   Resp: 18 19 18    Temp: 98.4 F (36.9 C) 98.9 F (37.2 C) 98.8 F (37.1 C)   TempSrc: Oral Oral Oral   SpO2: 91% 95% 96%   Weight:    (!) 157.9 kg  Height:        Intake/Output Summary (Last 24 hours) at 07/13/2018 1143 Last data filed  at 07/13/2018 0641 Gross per 24 hour  Intake -  Output 1375 ml  Net -1375 ml   Filed Weights   07/05/18 0540 07/06/18 0504 07/13/18 0649  Weight: (!) 137.8 kg (!) 139.5 kg (!) 157.9 kg    Examination: General exam: Appears comfortable  HEENT: PERRLA, oral mucosa moist, no sclera icterus or thrush Respiratory system: Clear to auscultation. Respiratory effort normal. Cardiovascular system: S1 & S2 heard,  No murmurs  Gastrointestinal system: Abdomen soft, non-tender, nondistended. Normal bowel sounds, colostomy present Central nervous system: Alert and oriented. No focal neurological deficits. Extremities: No cyanosis, clubbing or edema Skin: extensive full thickness wounds in skin folds, groin and abdominal wall Psychiatry:  Mood & affect appropriate.    Data Reviewed: I have personally reviewed following labs and imaging studies  CBC: Recent Labs  Lab 07/08/18 0547 07/09/18 0943 07/09/18 2226  WBC 8.8 8.0  --   HGB 8.5* 7.9* 9.6*  HCT 29.4* 26.9* 30.8*  MCV 78.6* 79.4*  --   PLT 453* 488*  --    Basic Metabolic Panel: Recent Labs  Lab 07/07/18 0611 07/08/18 0547 07/10/18 0530 07/11/18 0532  NA 138 139 137 136  K 4.9 4.0 4.6 4.5  CL 112* 110 108 105  CO2 23 23 20* 23  GLUCOSE 111* 107* 91 88  BUN 10 10 6  5*  CREATININE 1.06* 0.91 0.88 0.87  CALCIUM 8.3* 8.2* 8.5* 8.4*   GFR: Estimated Creatinine Clearance: 129 mL/min (by C-G formula based on SCr of 0.87 mg/dL). Liver Function Tests: No results for input(s): AST, ALT, ALKPHOS, BILITOT, PROT, ALBUMIN in the last 168 hours. No results for input(s): LIPASE, AMYLASE in the last 168 hours. No results for input(s): AMMONIA in the last 168 hours. Coagulation Profile: No results for input(s): INR, PROTIME in the last 168 hours. Cardiac Enzymes: No results for input(s): CKTOTAL, CKMB, CKMBINDEX, TROPONINI in the last 168 hours. BNP (last 3 results) No results for input(s): PROBNP in the last 8760  hours. HbA1C: No results for input(s): HGBA1C in the last 72 hours. CBG: No results for input(s): GLUCAP in the last 168 hours. Lipid Profile: No results for input(s): CHOL, HDL, LDLCALC, TRIG, CHOLHDL, LDLDIRECT in the last 72 hours. Thyroid Function Tests: No results for input(s): TSH, T4TOTAL, FREET4, T3FREE, THYROIDAB in the last 72 hours. Anemia Panel: No results for input(s): VITAMINB12, FOLATE, FERRITIN, TIBC, IRON, RETICCTPCT in the last 72 hours. Urine analysis:    Component Value Date/Time   COLORURINE YELLOW 07/05/2018 [place] 07/05/2018 0511   LABSPEC 1.020 07/05/2018 0511   PHURINE 5.0 07/05/2018 [place] 07/05/2018 0511   HGBUR NEGATIVE 07/05/2018 7543  BILIRUBINUR NEGATIVE 07/05/2018 0511   KETONESUR 5 (A) 07/05/2018 0511   PROTEINUR 30 (A) 07/05/2018 0511   UROBILINOGEN 0.2 07/22/2015 0405   NITRITE NEGATIVE 07/05/2018 0511   LEUKOCYTESUR NEGATIVE 07/05/2018 0511   Sepsis Labs: @LABRCNTIP (procalcitonin:4,lacticidven:4) ) Recent Results (from the past 240 hour(s))  Blood culture (routine x 2)     Status: Abnormal   Collection Time: 07/04/18  8:07 PM  Result Value Ref Range Status   Specimen Description   Final    BLOOD RIGHT ANTECUBITAL Performed at [facility] [place] [address] [place], [place] [address]    Special Requests   Final    BOTTLES DRAWN AEROBIC AND ANAEROBIC Blood Culture results may not be optimal due to an excessive volume of blood received in culture bottles Performed at [facility] [address] [place], [place] [address]    Culture  Setup Time   Final    GRAM POSITIVE COCCI AEROBIC BOTTLE ONLY CRITICAL RESULT CALLED TO, READ BACK BY AND VERIFIED WITH: J.GRIMSLEY,PHARMD AT 0981 ON 07/06/18 BY G.[email or web address]    Culture (A)  Final    STAPHYLOCOCCUS SPECIES (COAGULASE NEGATIVE) THE SIGNIFICANCE OF ISOLATING THIS ORGANISM FROM A SINGLE SET OF BLOOD CULTURES WHEN MULTIPLE  SETS ARE DRAWN IS UNCERTAIN. PLEASE NOTIFY THE MICROBIOLOGY DEPARTMENT WITHIN ONE WEEK IF SPECIATION AND SENSITIVITIES ARE REQUIRED. Performed at [facility], [place] [address], [place], [place] [address]    Report Status 07/07/2018 FINAL  Final  Blood Culture ID Panel (Reflexed)     Status: Abnormal   Collection Time: 07/04/18  8:07 PM  Result Value Ref Range Status   Enterococcus species NOT DETECTED NOT DETECTED Final   Listeria monocytogenes NOT DETECTED NOT DETECTED Final   Staphylococcus species DETECTED (A) NOT DETECTED Final    Comment: CRITICAL RESULT CALLED TO, READ BACK BY AND VERIFIED WITH: J.GRIMSLEY,PHARMD AT 8295 ON 07/06/18 BY G.[email or web address]    Staphylococcus aureus (BCID) NOT DETECTED NOT DETECTED Final   Methicillin resistance DETECTED (A) NOT DETECTED Final    Comment: CRITICAL RESULT CALLED TO, READ BACK BY AND VERIFIED WITH: J.GRIMSLEY,PHARMD AT 0508 ON 07/06/18 BY G.MCADOO    Streptococcus species NOT DETECTED NOT DETECTED Final   Streptococcus agalactiae NOT DETECTED NOT DETECTED Final   Streptococcus pneumoniae NOT DETECTED NOT DETECTED Final   Streptococcus pyogenes NOT DETECTED NOT DETECTED Final   Acinetobacter baumannii NOT DETECTED NOT DETECTED Final   Enterobacteriaceae species NOT DETECTED NOT DETECTED Final   Enterobacter cloacae complex NOT DETECTED NOT DETECTED Final   Escherichia coli NOT DETECTED NOT DETECTED Final   Klebsiella oxytoca NOT DETECTED NOT DETECTED Final   Klebsiella pneumoniae NOT DETECTED NOT DETECTED Final   Proteus species NOT DETECTED NOT DETECTED Final   Serratia marcescens NOT DETECTED NOT DETECTED Final   Haemophilus influenzae NOT DETECTED NOT DETECTED Final   Neisseria meningitidis NOT DETECTED NOT DETECTED Final   Pseudomonas aeruginosa NOT DETECTED NOT DETECTED Final   Candida albicans NOT DETECTED NOT DETECTED Final   Candida glabrata NOT DETECTED NOT DETECTED Final   Candida krusei NOT DETECTED NOT DETECTED Final    Candida parapsilosis NOT DETECTED NOT DETECTED Final   Candida tropicalis NOT DETECTED NOT DETECTED Final    Comment: Performed at [facility], [place] [address], [place],  [address]  Blood culture (routine x 2)     Status: None   Collection Time: 07/04/18  8:55 PM  Result Value Ref Range Status   Specimen Description  Final    BLOOD LEFT ARM Performed at [facility] [address] [place], [place] [address]    Special Requests   Final    BOTTLES DRAWN AEROBIC ONLY Blood Culture adequate volume Performed at [facility] [address], [place], [place] [address]    Culture   Final    NO GROWTH 5 DAYS Performed at [facility], [place] [address] [place], [place] [address]    Report Status 07/10/2018 FINAL  Final  Urine culture     Status: None   Collection Time: 07/05/18  5:11 AM  Result Value Ref Range Status   Specimen Description   Final    URINE, CATHETERIZED Performed at [facility] [address], [place], [place] [address]    Special Requests   Final    NONE Performed at [facility], [place] [address], [place], [place] [address]    Culture   Final    NO GROWTH Performed at [facility], [place] [address] [place], [place] [address]    Report Status 07/06/2018 FINAL  Final         Radiology Studies: No results found.    Scheduled Meds: . feeding supplement (ENSURE ENLIVE)  237 mL Oral Q24H  . feeding supplement (PRO-STAT SUGAR FREE 64)  30 mL Oral Daily  . ferrous gluconate  324 mg Oral BID WC  . multivitamin with minerals  1 tablet Oral Daily  . nutrition supplement (JUVEN)  1 packet Oral BID BM  . sodium bicarbonate  650 mg Oral Daily  . sodium chloride flush  3 mL Intravenous Q12H  . sulfamethoxazole-trimethoprim  2 tablet Oral Q12H   Continuous Infusions:    LOS: 7 days    Time spent in minutes: 30    Debbe Odea, MD [facility] Pager: [email or web address] Password [facility] 07/13/2018, 11:43 AM

## 2018-07-13 NOTE — Progress Notes (Signed)
CSW following to assist with discharge planning to SNF.   CSW followed up with patient and inquired about the status of disability application and [employer] term care medicaid application. Patient reported that her sister was going to try today to apply for both.   Patient currently has no bed offers or payor source for SNF. CSW Surveyor, quantity assisting with SNF placement for patient. CSW will continue to follow and assist with discharge planning.  Abundio Miu, Day Social Worker [facility] Cell#: [phone]

## 2018-07-14 LAB — CBC
HCT: 33.5 % — ABNORMAL LOW (ref 36.0–46.0)
Hemoglobin: 9.9 g/dL — ABNORMAL LOW (ref 12.0–15.0)
MCH: 24.1 pg — AB (ref 26.0–34.0)
MCHC: 29.6 g/dL — ABNORMAL LOW (ref 30.0–36.0)
MCV: 81.7 fL (ref 80.0–100.0)
NRBC: 0 % (ref 0.0–0.2)
Platelets: 486 10*3/uL — ABNORMAL HIGH (ref 150–400)
RBC: 4.1 MIL/uL (ref 3.87–5.11)
RDW: 23.2 % — ABNORMAL HIGH (ref 11.5–15.5)
WBC: 6.8 10*3/uL (ref 4.0–10.5)

## 2018-07-14 LAB — BASIC METABOLIC PANEL
ANION GAP: 7 (ref 5–15)
BUN: 7 mg/dL (ref 6–20)
CO2: 23 mmol/L (ref 22–32)
CREATININE: 1.04 mg/dL — AB (ref 0.44–1.00)
Calcium: 8.8 mg/dL — ABNORMAL LOW (ref 8.9–10.3)
Chloride: 103 mmol/L (ref 98–111)
GFR calc Af Amer: >60 mL/min (ref >60)
GFR calc non Af Amer: >60 mL/min (ref >60)
Glucose, Bld: 87 mg/dL (ref 70–99)
POTASSIUM: 4.6 mmol/L (ref 3.5–5.1)
Sodium: 133 mmol/L — ABNORMAL LOW (ref 135–145)

## 2018-07-14 MED ORDER — ENSURE ENLIVE PO LIQD
237.0000 mL | Freq: Two times a day (BID) | ORAL | Status: DC
  Administered 2018-07-14 – 2018-07-20 (×4): 237 mL via ORAL

## 2018-07-14 NOTE — Progress Notes (Signed)
Patient continues to experience generalize weakness on assessment. Patient has limited range of motion. Patient unable to complete ADLS. Patient is able to complete minimal bathing of upper body. Patient has had difficulty transferring to and from bed.  Patient continues to experience pain with any type of activity  even with medication.  Gonzella Lex, RN  07/14/2018  3:50 PM

## 2018-07-14 NOTE — Care Management Note (Signed)
Case Management Note  Patient Details  Name: Dominique [name] MRN: [id number] Date of Birth: 19-Dec-1980  Subjective/Objective:CIR cons-[employer] rep will f/u.                    Action/Plan:CIR/SNF   Expected Discharge Date:  07/09/18               Expected Discharge Plan:  [place]  In-House Referral:  Clinical Social Work  Discharge planning Services  CM Consult  Post Acute Care Choice:  Home Health(Active w/[facility]) Choice offered to:     DME Arranged:    DME Agency:     HH Arranged:    [place] Agency:     Status of Service:  In process, will continue to follow  If discussed at Long Length of Stay Meetings, dates discussed:    Additional Comments:  Dessa Phi, RN 07/14/2018, 10:41 AM

## 2018-07-14 NOTE — Progress Notes (Signed)
Occupational Therapy Treatment Patient Details Name: Dominique [name] MRN: [id number] DOB: [birth year] Today's Date: 07/14/2018    History of present illness 37 y.o. 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 Dx of hypovolemia, sepsis, cellulitis   OT comments  Assisted pt up to recliner for pt to perform functional grooming tasks once in chair. Pt's HR noted to be 132 once in chair. Informed nursing. Will continue to follow pt for OT as she will benefit from OT to increase her independence with self care tasks.   Follow Up Recommendations  SNF;Supervision/Assistance - 24 hour    Equipment Recommendations  3 in 1 bedside commode    Recommendations for Other Services      Precautions / Restrictions Precautions Precautions: Fall Precaution Comments: colostomy; monitor HR Restrictions Weight Bearing Restrictions: No       Mobility Bed Mobility Overal bed mobility: Needs Assistance Bed Mobility: Supine to Sit     Supine to sit: Min guard;HOB elevated     General bed mobility comments: use of rail and HOB raised.   Transfers Overall transfer level: Needs assistance Equipment used: Rolling walker (2 wheeled) Transfers: Sit to/from [facility] Sit to Stand: Min guard Stand pivot transfers: Min guard       General transfer comment: min guard for safety.     Balance                                           ADL either performed or assessed with clinical judgement   ADL                           Toilet Transfer: Min guard;Stand-pivot;RW             General ADL Comments: Assisted pt up to chair as pt with colostomy and still with catheter currently.  Once pt in chair HR noted 132. Informed nursing of  HR. Pt performed grooming tasks once in recliner for this visit.      Vision Patient Visual Report: No change from baseline     Perception     Praxis      Cognition Arousal/Alertness: Awake/alert Behavior During Therapy: WFL for tasks assessed/performed Overall Cognitive Status: Within Functional Limits for tasks assessed                                          Exercises     Shoulder Instructions       General Comments      Pertinent Vitals/ Pain       Pain Assessment: No/denies pain  Home Living                                          Prior Functioning/Environment              Frequency  Min 2X/week        Progress Toward Goals  OT Goals(current goals can now be found in the care  plan section)  Progress towards OT goals: Progressing toward goals     Plan Discharge plan remains appropriate    Co-evaluation                 AM-PAC PT "6 Clicks" Daily Activity     Outcome Measure   Help from another person eating meals?: None Help from another person taking care of personal grooming?: A Little Help from another person toileting, which includes using toliet, bedpan, or urinal?: A Lot Help from another person bathing (including washing, rinsing, drying)?: A Lot Help from another person to put on and taking off regular upper body clothing?: A Little Help from another person to put on and taking off regular lower body clothing?: A Lot 6 Click Score: 16    End of Session Equipment Utilized During Treatment: Rolling walker  OT Visit Diagnosis: Unsteadiness on feet (R26.81);Muscle weakness (generalized) (M62.81)   Activity Tolerance Patient tolerated treatment well   Patient Left in chair;with call bell/phone within reach   Nurse Communication          Time: 1140-1200 OT Time Calculation (min): 20 min  Charges: OT General Charges $OT Visit: 1 Visit OT Treatments $Therapeutic Activity: 8-22  mins      Pauline Aus OTR/L Acute Rehabilitation [phone] office number

## 2018-07-14 NOTE — Progress Notes (Signed)
Nutrition Follow-up  DOCUMENTATION CODES:   Non-severe (moderate) malnutrition in context of chronic illness, Morbid obesity  INTERVENTION:  -  Continue Prostat once/day. - Will d/c Juven. - Will increase Ensure Enlive to BID.  - Continue to encourage PO intakes.    NUTRITION DIAGNOSIS:   Moderate Malnutrition related to chronic illness as evidenced by energy intake < or equal to 75% for > or equal to 1 month, percent weight loss. -ongoing  GOAL:   Patient will meet greater than or equal to 90% of their needs -likely unmet  MONITOR:   PO intake, Supplement acceptance, Weight trends, Labs, Skin  ASSESSMENT:   37 y.o. female past medical history significant for obesity, OSA, Crohn's disease s/p perforation with colostomy, chronic wound. She was in her usual health state of health until she developed recurrent nausea and vomiting several days ago, she is not lightheaded. Patient admitted with diagnosis of hypovolemia.   Current weight consistent with weight at time of admission; will continue to monitor closely. No intakes documented since last RD visit. Patient on the phone with her mom at the time of RD visit but was able to briefly place her mom on hold to talk with this RD.   She reports that yesterday she was nauseated and had one episode of emesis. She states other than this, she has had no recent episodes of N/V or abdominal discomfort. She had a headache during RD visit last week and reports that this has greatly improved.  PTA she was mainly only consuming dinner meal. Patient reports that this has continued to be the case the past week and that she does not really consume anything during the day prior to the evening.   Supplements were ordered during previous assessment. Patient has been declining most packets of Prostat and Juven, but accepting >75% bottles of Ensure.  Per Dr. Reggy Eye note yesterday AM: hypovolemia and orthostatic hypotension and hyponatremia now resolved  after aggressive IV hydration, anemia d/t acute blood loss, metabolic acidosis and AKI--improving, severe wounds with cellulitis and sepsis and completed course of Batrim 10/21, severe deconditioning. Patient is pending insurance approval in order to d/c to SNF.    Medications reviewed; daily multivitamin with minerals, 650 mg oral sodium bicarb/day. Labs reviewed; Na: 133 mmol/L, creatinine: 1.04 mg/dL, Ca: 8.8 mg/dL.    Diet Order:   Diet Order            Diet - low sodium heart healthy        Diet Heart Room service appropriate? Yes; Fluid consistency: Thin  Diet effective now              EDUCATION NEEDS:   Not appropriate for education at this time  Skin:  Skin Assessment: Skin Integrity Issues: Skin Integrity Issues:: Other (Comment) Other: per flowsheet, non-pressure wounds to: abdomen, upper and lower abdomen, L breast, bilateral groins, sacrum, and bilateral lower back.  Last BM:  10/22: 200 ml via ileostomy  Height:   Ht Readings from Last 1 Encounters:  07/04/18 5' 2"  (1.575 m)    Weight:   Wt Readings from Last 1 Encounters:  07/14/18 (!) 159.2 kg    Ideal Body Weight:  50 kg  BMI:  Body mass index is 64.2 kg/m.  Estimated Nutritional Needs:   Kcal:  2230-2510 (16-18 kcal/kg)  Protein:  135-145 grams  Fluid:  >/= 2.3 L/day     Jarome Matin, MS, RD, LDN, CNSC Inpatient Clinical Dietitian Pager # [phone] After hours/weekend pager #  [phone]  

## 2018-07-14 NOTE — Progress Notes (Signed)
PROGRESS NOTE    Dominique [name]   WHQ:[id number]  DOB: [birth year]  DOA: 07/04/2018 PCP: Benito Mccreedy, MD   Brief Narrative:  Dominique [name] is an 37 y.o. 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 She presented to the ED for lightheaded sensation when she was sitting or standing up. HR noted to be 140s, sinus tachycardia.     Subjective: No new complaints.     Assessment & Plan:   Principal Problem:   Hypovolemia/   Orthostatic hypotension/ hyponatremia -after aggressive IV hydration, orthostatic hypotension & hyponatremia have resolved- she is eating and drinking appropriately now  Active Problems: Anemia due to acute blood loss and Iron deficiency - has bleeding from wounds and her periods - Hb dropped from 9.4 >> 7.9   - Iron level 14, Ferretin was 15 on 9/19 - 10/17>  given 1 U PRBC and a dose of IV Iron - started oral Iron - Hb now ~ 9  Metabolic acidosis/ AKI - suspect prerenal - treated with IVF and Bicarb tabs - Cr has improved- Bicarb improved to 23   - Cr slightly up today- ? Due to Bactrim which I have discontinued today   Crohn's colitis with perforation s/p left colectomy/colostomy 2016  cont to follow stool output to ensure we are matching her output with oral and IV fluids  Severe wounds with cellulitis and sepsis - has chronic wounds in abdomen from prior surgery and new wounds due to moisture damage to skin in skin folds/ panus and vaginal area which are full thickness wounds  - added Oxycodone and PRN Dilaudid which appears to be helping - on Bactrim- has completed 7 days - will d/c today  Indwelling urinary catheter - to prevent soiling of wounds  Loss of appetite/ mod protein calorie malnutrition - has not been eating much  at home and has lost about 30 lbs - continue supplements    OSA on CPAP  Super morbid obesity Body mass index is 64.2 kg/m.   Severe deconditioning  -meets criteria to get rehab at SNF which she is in agreement with but is not getting insurance approval-   Not appropriate for CIR    DVT prophylaxis: SCDs Code Status: Full code Family Communication:  Disposition Plan:  SNF but awaiting insurance approval Consultants:   Wound care Procedures:    Antimicrobials:  Anti-infectives (From admission, onward)   Start     Dose/Rate Route Frequency Ordered Stop   07/09/18 0000  sulfamethoxazole-trimethoprim (BACTRIM) 400-80 MG tablet     1 tablet Oral 2 times daily 07/09/18 1619     07/07/18 1000  vancomycin (VANCOCIN) 1,500 mg in sodium chloride 0.9 % 500 mL IVPB  Status:  Discontinued     1,500 mg 250 mL/hr over 120 Minutes Intravenous Every 24 hours 07/06/18 1014 07/07/18 0938   07/07/18 1000  sulfamethoxazole-trimethoprim (BACTRIM DS,SEPTRA DS) 800-160 MG per tablet 2 tablet  Status:  Discontinued     2 tablet Oral Every 12 hours 07/07/18 0938 07/14/18 0734   07/06/18 1200  cefTRIAXone (ROCEPHIN) 2 g in sodium chloride 0.9 % 100 mL IVPB  Status:  Discontinued     2 g 200 mL/hr over 30 Minutes Intravenous Every 24 hours 07/06/18 0948 07/07/18 0938   07/06/18 1100  doxycycline (VIBRAMYCIN) 100 mg in sodium  chloride 0.9 % 250 mL IVPB  Status:  Discontinued     100 mg 125 mL/hr over 120 Minutes Intravenous Every 12 hours 07/06/18 0915 07/06/18 1002   07/06/18 1015  vancomycin (VANCOCIN) 2,500 mg in sodium chloride 0.9 % 500 mL IVPB     2,500 mg 250 mL/hr over 120 Minutes Intravenous  Once 07/06/18 1014 07/07/18 0826       Objective: Vitals:   07/14/18 0637 07/14/18 0645 07/14/18 1145 07/14/18 1303  BP: 140/77   (!) 123/93  Pulse: (!) 121  (!) 132 (!) 123  Resp: 18   20  Temp: 98.2 F (36.8 C)   98 F (36.7 C)  TempSrc: Oral   Oral  SpO2: 96%  96% 96%  Weight:  (!) 159.2  kg    Height:        Intake/Output Summary (Last 24 hours) at 07/14/2018 1724 Last data filed at 07/14/2018 1305 Gross per 24 hour  Intake 243 ml  Output 1000 ml  Net -757 ml   Filed Weights   07/06/18 0504 07/13/18 0649 07/14/18 0645  Weight: (!) 139.5 kg (!) 157.9 kg (!) 159.2 kg    Examination: General exam: Appears comfortable  HEENT: PERRLA, oral mucosa moist, no sclera icterus or thrush Respiratory system: Clear to auscultation. Respiratory effort normal. Cardiovascular system: S1 & S2 heard,  No murmurs  Gastrointestinal system: Abdomen soft, non-tender, nondistended. Normal bowel sound. No organomegaly Central nervous system: Alert and oriented. No focal neurological deficits. Extremities: No cyanosis, clubbing or edema Skin:extensive full thickness wounds in skin folds, abdomen and groin with severe pain when examined Psychiatry:  Mood & affect appropriate.    Data Reviewed: I have personally reviewed following labs and imaging studies  CBC: Recent Labs  Lab 07/08/18 0547 07/09/18 0943 07/09/18 2226 07/14/18 0536  WBC 8.8 8.0  --  6.8  HGB 8.5* 7.9* 9.6* 9.9*  HCT 29.4* 26.9* 30.8* 33.5*  MCV 78.6* 79.4*  --  81.7  PLT 453* 488*  --  818*   Basic Metabolic Panel: Recent Labs  Lab 07/08/18 0547 07/10/18 0530 07/11/18 0532 07/14/18 0536  NA 139 137 136 133*  K 4.0 4.6 4.5 4.6  CL 110 108 105 103  CO2 23 20* 23 23  GLUCOSE 107* 91 88 87  BUN 10 6 5* 7  CREATININE 0.91 0.88 0.87 1.04*  CALCIUM 8.2* 8.5* 8.4* 8.8*   GFR: Estimated Creatinine Clearance: 108.5 mL/min (A) (by C-G formula based on SCr of 1.04 mg/dL (H)). Liver Function Tests: No results for input(s): AST, ALT, ALKPHOS, BILITOT, PROT, ALBUMIN in the last 168 hours. No results for input(s): LIPASE, AMYLASE in the last 168 hours. No results for input(s): AMMONIA in the last 168 hours. Coagulation Profile: No results for input(s): INR, PROTIME in the last 168 hours. Cardiac Enzymes: No  results for input(s): CKTOTAL, CKMB, CKMBINDEX, TROPONINI in the last 168 hours. BNP (last 3 results) No results for input(s): PROBNP in the last 8760 hours. HbA1C: No results for input(s): HGBA1C in the last 72 hours. CBG: No results for input(s): GLUCAP in the last 168 hours. Lipid Profile: No results for input(s): CHOL, HDL, LDLCALC, TRIG, CHOLHDL, LDLDIRECT in the last 72 hours. Thyroid Function Tests: No results for input(s): TSH, T4TOTAL, FREET4, T3FREE, THYROIDAB in the last 72 hours. Anemia Panel: No results for input(s): VITAMINB12, FOLATE, FERRITIN, TIBC, IRON, RETICCTPCT in the last 72 hours. Urine analysis:    Component Value Date/Time   COLORURINE YELLOW 07/05/2018  [place] 07/05/2018 0511   LABSPEC 1.020 07/05/2018 0511   PHURINE 5.0 07/05/2018 0511   GLUCOSEU NEGATIVE 07/05/2018 0511   HGBUR NEGATIVE 07/05/2018 0511   BILIRUBINUR NEGATIVE 07/05/2018 0511   KETONESUR 5 (A) 07/05/2018 0511   PROTEINUR 30 (A) 07/05/2018 0511   UROBILINOGEN 0.2 07/22/2015 0405   NITRITE NEGATIVE 07/05/2018 0511   LEUKOCYTESUR NEGATIVE 07/05/2018 0511   Sepsis Labs: @LABRCNTIP (procalcitonin:4,lacticidven:4) ) Recent Results (from the past 240 hour(s))  Blood culture (routine x 2)     Status: Abnormal   Collection Time: 07/04/18  8:07 PM  Result Value Ref Range Status   Specimen Description   Final    BLOOD RIGHT ANTECUBITAL Performed at [facility], [place] [address] [place], [place] [address]    Special Requests   Final    BOTTLES DRAWN AEROBIC AND ANAEROBIC Blood Culture results may not be optimal due to an excessive volume of blood received in culture bottles Performed at [place] [address] [place], [place] [address]    Culture  Setup Time   Final    GRAM POSITIVE COCCI AEROBIC BOTTLE ONLY CRITICAL RESULT CALLED TO, READ BACK BY AND VERIFIED WITH: J.GRIMSLEY,PHARMD AT 3354 ON 07/06/18 BY [email or web address]    Culture  (A)  Final    STAPHYLOCOCCUS SPECIES (COAGULASE NEGATIVE) THE SIGNIFICANCE OF ISOLATING THIS ORGANISM FROM A SINGLE SET OF BLOOD CULTURES WHEN MULTIPLE SETS ARE DRAWN IS UNCERTAIN. PLEASE NOTIFY THE MICROBIOLOGY DEPARTMENT WITHIN ONE WEEK IF SPECIATION AND SENSITIVITIES ARE REQUIRED. Performed at [facility], [place] [address], [place], [place] [address]    Report Status 07/07/2018 FINAL  Final  Blood Culture ID Panel (Reflexed)     Status: Abnormal   Collection Time: 07/04/18  8:07 PM  Result Value Ref Range Status   Enterococcus species NOT DETECTED NOT DETECTED Final   Listeria monocytogenes NOT DETECTED NOT DETECTED Final   Staphylococcus species DETECTED (A) NOT DETECTED Final    Comment: CRITICAL RESULT CALLED TO, READ BACK BY AND VERIFIED WITH: J.GRIMSLEY,PHARMD AT 3893 ON 07/06/18 BY [email or web address]    Staphylococcus aureus (BCID) NOT DETECTED NOT DETECTED Final   Methicillin resistance DETECTED (A) NOT DETECTED Final    Comment: CRITICAL RESULT CALLED TO, READ BACK BY AND VERIFIED WITH: J.GRIMSLEY,PHARMD AT 0508 ON 07/06/18 BY G.[email or web address]    Streptococcus species NOT DETECTED NOT DETECTED Final   Streptococcus agalactiae NOT DETECTED NOT DETECTED Final   Streptococcus pneumoniae NOT DETECTED NOT DETECTED Final   Streptococcus pyogenes NOT DETECTED NOT DETECTED Final   Acinetobacter baumannii NOT DETECTED NOT DETECTED Final   Enterobacteriaceae species NOT DETECTED NOT DETECTED Final   Enterobacter cloacae complex NOT DETECTED NOT DETECTED Final   Escherichia coli NOT DETECTED NOT DETECTED Final   Klebsiella oxytoca NOT DETECTED NOT DETECTED Final   Klebsiella pneumoniae NOT DETECTED NOT DETECTED Final   Proteus species NOT DETECTED NOT DETECTED Final   Serratia marcescens NOT DETECTED NOT DETECTED Final   Haemophilus influenzae NOT DETECTED NOT DETECTED Final   Neisseria meningitidis NOT DETECTED NOT DETECTED Final   Pseudomonas aeruginosa NOT DETECTED NOT DETECTED Final    Candida albicans NOT DETECTED NOT DETECTED Final   Candida glabrata NOT DETECTED NOT DETECTED Final   Candida krusei NOT DETECTED NOT DETECTED Final   Candida parapsilosis NOT DETECTED NOT DETECTED Final   Candida tropicalis NOT DETECTED NOT DETECTED Final    Comment: Performed at [facility], [place] [address],  [place], [place] [address]  Blood culture (routine x 2)     Status: None   Collection Time: 07/04/18  8:55 PM  Result Value Ref Range Status   Specimen Description   Final    BLOOD LEFT ARM Performed at [place] [address], [place], [place] [address]    Special Requests   Final    BOTTLES DRAWN AEROBIC ONLY Blood Culture adequate volume Performed at [place] [address] [place], [place] [address]    Culture   Final    NO GROWTH 5 DAYS Performed at [facility], [place] [address], [place], [place] [address]    Report Status 07/10/2018 FINAL  Final  Urine culture     Status: None   Collection Time: 07/05/18  5:11 AM  Result Value Ref Range Status   Specimen Description   Final    URINE, CATHETERIZED Performed at [place] [address], [place], [place] [address]    Special Requests   Final    NONE Performed at [facility], [place] [address], [place], [place] [address]    Culture   Final    NO GROWTH Performed at [facility], [place] [address], [place], [place] [address]    Report Status 07/06/2018 FINAL  Final         Radiology Studies: No results found.    Scheduled Meds: . feeding supplement (ENSURE ENLIVE)  237 mL Oral BID BM  . feeding supplement (PRO-STAT SUGAR FREE 64)  30 mL Oral Daily  . ferrous gluconate  324 mg Oral BID WC  . multivitamin with minerals  1 tablet Oral Daily  . sodium bicarbonate  650 mg Oral Daily  . sodium chloride flush  3 mL Intravenous Q12H   Continuous Infusions:    LOS: 8 days    Time spent in minutes:  30    Debbe Odea, MD [facility] Pager: [email or web address] Password [facility] 07/14/2018, 5:24 PM

## 2018-07-14 NOTE — Progress Notes (Signed)
Inpatient Rehabilitation-Admissions Coordinator   [facility] followed up with CM Juliann Pulse, regarding CIR consult order. After reviewing the chart, it appears that the patient does not require an intensive CIR therapy program at this time. Agree with SNF recommendations. AC will DC consult order and will sign off.   Please call if questions.   Jhonnie Garner, OTR/L  Rehab Admissions Coordinator  [phone] 07/14/2018 1:59 PM

## 2018-07-15 ENCOUNTER — Inpatient Hospital Stay (HOSPITAL_COMMUNITY): Payer: Medicaid Other

## 2018-07-15 LAB — COMPREHENSIVE METABOLIC PANEL
ALK PHOS: 83 U/L (ref 38–126)
ALT: 21 U/L (ref 0–44)
AST: 31 U/L (ref 15–41)
Albumin: 2.8 g/dL — ABNORMAL LOW (ref 3.5–5.0)
Anion gap: 10 (ref 5–15)
BUN: 8 mg/dL (ref 6–20)
CALCIUM: 8.9 mg/dL (ref 8.9–10.3)
CO2: 23 mmol/L (ref 22–32)
CREATININE: 1.13 mg/dL — AB (ref 0.44–1.00)
Chloride: 99 mmol/L (ref 98–111)
GFR calc non Af Amer: >60 mL/min (ref >60)
Glucose, Bld: 96 mg/dL (ref 70–99)
Potassium: 4.5 mmol/L (ref 3.5–5.1)
SODIUM: 132 mmol/L — AB (ref 135–145)
Total Bilirubin: 0.9 mg/dL (ref 0.3–1.2)
Total Protein: 7.5 g/dL (ref 6.5–8.1)

## 2018-07-15 LAB — PHOSPHORUS: PHOSPHORUS: 3.8 mg/dL (ref 2.5–4.6)

## 2018-07-15 LAB — CBC WITH DIFFERENTIAL/PLATELET
Abs Immature Granulocytes: 0.02 10*3/uL (ref 0.00–0.07)
BASOS PCT: 1 %
Basophils Absolute: 0.1 10*3/uL (ref 0.0–0.1)
EOS PCT: 8 %
Eosinophils Absolute: 0.5 10*3/uL (ref 0.0–0.5)
HEMATOCRIT: 34.8 % — AB (ref 36.0–46.0)
HEMOGLOBIN: 10.3 g/dL — AB (ref 12.0–15.0)
Immature Granulocytes: 0 %
Lymphocytes Relative: 26 %
Lymphs Abs: 1.6 10*3/uL (ref 0.7–4.0)
MCH: 24.5 pg — AB (ref 26.0–34.0)
MCHC: 29.6 g/dL — ABNORMAL LOW (ref 30.0–36.0)
MCV: 82.7 fL (ref 80.0–100.0)
MONO ABS: 1 10*3/uL (ref 0.1–1.0)
Monocytes Relative: 17 %
Neutro Abs: 3 10*3/uL (ref 1.7–7.7)
Neutrophils Relative %: 48 %
Platelets: 461 10*3/uL — ABNORMAL HIGH (ref 150–400)
RBC: 4.21 MIL/uL (ref 3.87–5.11)
RDW: 23.1 % — ABNORMAL HIGH (ref 11.5–15.5)
WBC: 6.3 10*3/uL (ref 4.0–10.5)
nRBC: 0 % (ref 0.0–0.2)

## 2018-07-15 LAB — MAGNESIUM: Magnesium: 1.9 mg/dL (ref 1.7–2.4)

## 2018-07-15 MED ORDER — POLYVINYL ALCOHOL 1.4 % OP SOLN
1.0000 [drp] | OPHTHALMIC | Status: DC | PRN
  Administered 2018-07-20: 1 [drp] via OPHTHALMIC
  Filled 2018-07-15: qty 15

## 2018-07-15 MED ORDER — SODIUM CHLORIDE 0.9 % IV BOLUS
500.0000 mL | Freq: Once | INTRAVENOUS | Status: AC
  Administered 2018-07-15: 500 mL via INTRAVENOUS

## 2018-07-15 MED ORDER — NAPHAZOLINE-GLYCERIN 0.012-0.2 % OP SOLN
1.0000 [drp] | Freq: Four times a day (QID) | OPHTHALMIC | Status: DC | PRN
  Administered 2018-07-16 – 2018-07-20 (×2): 2 [drp] via OPHTHALMIC
  Filled 2018-07-15: qty 15

## 2018-07-15 MED ORDER — SODIUM CHLORIDE 0.9 % IV BOLUS
1000.0000 mL | Freq: Once | INTRAVENOUS | Status: AC
  Administered 2018-07-15: 1000 mL via INTRAVENOUS

## 2018-07-15 MED ORDER — SODIUM CHLORIDE 0.9 % IV SOLN
INTRAVENOUS | Status: DC
  Administered 2018-07-15 – 2018-07-17 (×3): via INTRAVENOUS

## 2018-07-15 MED ORDER — HYDROMORPHONE HCL 1 MG/ML IJ SOLN
1.0000 mg | INTRAMUSCULAR | Status: DC | PRN
  Administered 2018-07-16 – 2018-08-14 (×60): 1 mg via INTRAVENOUS
  Filled 2018-07-15 (×60): qty 1

## 2018-07-15 NOTE — Progress Notes (Signed)
PROGRESS NOTE    Dominique [name]  YIR:[id number] DOB: 07-Sep-1981 DOA: 07/04/2018 PCP: Benito Mccreedy, MD  Brief Narrative:  Dominique [name] is an 37 y.o.femalepast medical history significant for morbid obesity obstructive sleep apnea Crohn's, status post perforation with colostomy chronic wounds and abdomen and bilateral groin, who was in his usual health until she developed recurrent nausea and vomiting several days ago. She presented to the ED for lightheaded sensation when she was sitting or standing up. HR noted to be 140s, sinus tachycardia. Now treated for 7 days of Abx for multiple wounds with associated cellulitis and Sepsis. Awaiting SNF placement.   Assessment & Plan:   Principal Problem:   Hypovolemia Active Problems:   Orthostatic hypotension   Crohn's colitis with perforation s/p left colectomy/colostomy 2016   OSA on CPAP   Chronic anemia   Multiple wounds of skin   Mild renal insufficiency   Hyponatremia   Malnutrition of moderate degree   Dehydration  Hypovolemia/ Orthostatic hypotension/ Hyponatremia -after aggressive IV hydration, orthostatic hypotension & hyponatremia have resolved- she is eating and drinking appropriately now but will resume IVF as Cr trending back up -See Below  Anemia due to acute blood loss and Iron deficiency - has bleeding from wounds and her periods - Hb dropped from 9.4 >> 7.9   - Iron level 14, Ferretin was 15 on 9/19 - 10/17>  given 1 U PRBC and a dose of IV Iron - started oral Iron - Hb now ~ 9  Metabolic acidosis/ AKI - Suspect prerenal - treated with IVF and Bicarb tabs - Cr had improved- Bicarb improved to 23   - Cr slightly up as BUN/Cr is now 8/1.13 and now Bicarb has been discontinued -Will restart IVF hydration with NS at 75 mL/hr   Crohn's colitis with perforation s/p left colectomy/colostomy 2016 -Continue to follow stool output to ensure we are matching her output with oral and IV fluids -Resumed IVF hydration  today   Severe wounds with cellulitis and sepsis - has chronic wounds in abdomen from prior surgery and new wounds due to moisture damage to skin in skin folds/ panus and vaginal area which are full thickness wounds  - added Oxycodone and PRN Dilaudid which appears to be helping -S/p Completion of Bactrim  Indwelling urinary catheter - to prevent soiling of wounds - Will need to D/C and do Voiding trial  Loss of appetite/ mod protein calorie malnutrition - has not been eating much at home and has lost about 30 lbs - Continue supplements  OSA -On CPAP   Super Morbid Obesity -Estimated body mass index is 63.47 kg/m as calculated from the following:   Height as of this encounter: 5' 2"  (1.575 m).   Weight as of this encounter: 157.4 kg. -Weight loss Counseling given   Severe Deconditioning -Meets criteria to get rehab at SNF which she is in agreement with but is not getting insurance approval -Not appropriate for CIR -PT/OT Recommending SNF  Sinus Tachycardia -In the setting of Severe Deconditioning and Dehydration -Gave IVF Bolus; Continue with IVF Hydration -Place on Telemetry  Right Eye Erythema and Swelling -? From using a dirty rag to wipe eye -Discussed case with Opthamology Dr. Nancy Fetter who recommends using artificial tears currently for supportive care and removal of the direct -If is not improving ophthalmology consult in few days  Thrombocytosis -Likely reactive -Patient to count went from 434 on 07/05/2018 is now 461 -Continue monitor and repeat CBC in a.m.  Bilateral Knee Warmth  and Swelling -Right Knee worse than Left -X-Rays were un-revealing  -Continue to Monitor and Up with Therapy   DVT prophylaxis: SCD's Code Status: FULL CODE Family Communication: No family present at bedside  Disposition Plan: SNF when Insurance Approves  Consultants:   Discussed the case with ophthalmology Dr. Nancy Fetter   Procedures: None   Antimicrobials:  Anti-infectives  (From admission, onward)   Start     Dose/Rate Route Frequency Ordered Stop   07/09/18 0000  sulfamethoxazole-trimethoprim (BACTRIM) 400-80 MG tablet     1 tablet Oral 2 times daily 07/09/18 1619     07/07/18 1000  vancomycin (VANCOCIN) 1,500 mg in sodium chloride 0.9 % 500 mL IVPB  Status:  Discontinued     1,500 mg 250 mL/hr over 120 Minutes Intravenous Every 24 hours 07/06/18 1014 07/07/18 0938   07/07/18 1000  sulfamethoxazole-trimethoprim (BACTRIM DS,SEPTRA DS) 800-160 MG per tablet 2 tablet  Status:  Discontinued     2 tablet Oral Every 12 hours 07/07/18 0938 07/14/18 0734   07/06/18 1200  cefTRIAXone (ROCEPHIN) 2 g in sodium chloride 0.9 % 100 mL IVPB  Status:  Discontinued     2 g 200 mL/hr over 30 Minutes Intravenous Every 24 hours 07/06/18 0948 07/07/18 0938   07/06/18 1100  doxycycline (VIBRAMYCIN) 100 mg in sodium chloride 0.9 % 250 mL IVPB  Status:  Discontinued     100 mg 125 mL/hr over 120 Minutes Intravenous Every 12 hours 07/06/18 0915 07/06/18 1002   07/06/18 1015  vancomycin (VANCOCIN) 2,500 mg in sodium chloride 0.9 % 500 mL IVPB     2,500 mg 250 mL/hr over 120 Minutes Intravenous  Once 07/06/18 1014 07/07/18 0826     Subjective: Seen and examined at bedside and denying chest pain but has some mild abdominal pain.  Very swollen and pain when examining her wounds.  No nausea or vomiting.  Slept okay.  Objective: Vitals:   07/14/18 2232 07/15/18 0630 07/15/18 1556 07/15/18 1601  BP: (!) 125/59 121/76    Pulse: (!) 120 (!) 122  (!) 131  Resp: 18 18  18   Temp: 98.3 F (36.8 C) 98 F (36.7 C)  (!) 97.5 F (36.4 C)  TempSrc: Oral Oral  Oral  SpO2: 97% 97% 98% 98%  Weight:  (!) 157.4 kg    Height:        Intake/Output Summary (Last 24 hours) at 07/15/2018 1733 Last data filed at 07/15/2018 1605 Gross per 24 hour  Intake 50 ml  Output 1050 ml  Net -1000 ml   Filed Weights   07/13/18 0649 07/14/18 0645 07/15/18 0630  Weight: (!) 157.9 kg (!) 159.2 kg (!)  157.4 kg   Examination: Physical Exam:  Constitutional: WN/WD extremely morbidly obese AAF in NAD and appears calm but uncomfortable Eyes: Right Eye extremely swollen and erythematous  ENMT: External Ears, Nose appear normal. Grossly normal hearing. Mucous membranes are moist. Neck: Appears normal, supple, no cervical masses, normal ROM, no appreciable thyromegaly, Difficult to assess JVD Respiratory: Diminished to auscultation bilaterally, no wheezing, rales, rhonchi or crackles. Normal respiratory effort and patient is not tachypenic. No accessory muscle use.  Cardiovascular: RRR, no murmurs / rubs / gallops. S1 and S2 auscultated. Has no appreciable edema Abdomen: Soft, non-tender, Distended significantly. No masses palpated. No appreciable hepatosplenomegaly. Bowel sounds positive x4.  GU: Deferred. Musculoskeletal: No clubbing / cyanosis of digits/nails.  Normal strength and muscle tone.  Skin: Has extensive full thickness wounds in the abdomen and groin that  are covered Neurologic: CN 2-12 grossly intact with no focal deficits.  Romberg sign and cerebellar reflexes not assessed.  Psychiatric: Normal judgment and insight. Alert and oriented x 3. Normal mood and appropriate affect.   Data Reviewed: I have personally reviewed following labs and imaging studies  CBC: Recent Labs  Lab 07/09/18 0943 07/09/18 2226 07/14/18 0536 07/15/18 1038  WBC 8.0  --  6.8 6.3  NEUTROABS  --   --   --  3.0  HGB 7.9* 9.6* 9.9* 10.3*  HCT 26.9* 30.8* 33.5* 34.8*  MCV 79.4*  --  81.7 82.7  PLT 488*  --  486* 347*   Basic Metabolic Panel: Recent Labs  Lab 07/10/18 0530 07/11/18 0532 07/14/18 0536 07/15/18 1038  NA 137 136 133* 132*  K 4.6 4.5 4.6 4.5  CL 108 105 103 99  CO2 20* 23 23 23   GLUCOSE 91 88 87 96  BUN 6 5* 7 8  CREATININE 0.88 0.87 1.04* 1.13*  CALCIUM 8.5* 8.4* 8.8* 8.9  MG  --   --   --  1.9  PHOS  --   --   --  3.8   GFR: Estimated Creatinine Clearance: 99.1 mL/min (A)  (by C-G formula based on SCr of 1.13 mg/dL (H)). Liver Function Tests: Recent Labs  Lab 07/15/18 1038  AST 31  ALT 21  ALKPHOS 83  BILITOT 0.9  PROT 7.5  ALBUMIN 2.8*   No results for input(s): LIPASE, AMYLASE in the last 168 hours. No results for input(s): AMMONIA in the last 168 hours. Coagulation Profile: No results for input(s): INR, PROTIME in the last 168 hours. Cardiac Enzymes: No results for input(s): CKTOTAL, CKMB, CKMBINDEX, TROPONINI in the last 168 hours. BNP (last 3 results) No results for input(s): PROBNP in the last 8760 hours. HbA1C: No results for input(s): HGBA1C in the last 72 hours. CBG: No results for input(s): GLUCAP in the last 168 hours. Lipid Profile: No results for input(s): CHOL, HDL, LDLCALC, TRIG, CHOLHDL, LDLDIRECT in the last 72 hours. Thyroid Function Tests: No results for input(s): TSH, T4TOTAL, FREET4, T3FREE, THYROIDAB in the last 72 hours. Anemia Panel: No results for input(s): VITAMINB12, FOLATE, FERRITIN, TIBC, IRON, RETICCTPCT in the last 72 hours. Sepsis Labs: No results for input(s): PROCALCITON, LATICACIDVEN in the last 168 hours.  No results found for this or any previous visit (from the past 240 hour(s)).   Radiology Studies: Dg Knee 1-2 Views Left  Result Date: 07/15/2018 CLINICAL DATA:  37 year old female with a history of both knees warm to the touch EXAM: LEFT KNEE - 1-2 VIEW COMPARISON:  None. FINDINGS: No acute displaced fracture. No erosive changes. Medial greater than lateral joint space narrowing with marginal osteophyte formation. Degenerative changes at the patellofemoral joint. No joint effusion. IMPRESSION: Negative for acute bony abnormality. No evidence of joint effusion. Tricompartmental osteoarthritis. Electronically Signed   By: Corrie Mckusick D.O.   On: 07/15/2018 13:17   Dg Knee 1-2 Views Right  Result Date: 07/15/2018 CLINICAL DATA:  37 year old female with a history of knees warm to the touch EXAM: RIGHT KNEE  - 1-2 VIEW COMPARISON:  None. FINDINGS: No acute displaced fracture. No erosive changes. Medial greater than lateral joint space narrowing. Marginal osteophyte formation. No evidence of joint effusion. Degenerative changes of the patellofemoral joint. No radiopaque foreign body. IMPRESSION: No acute bony abnormality. No joint effusion.  Tricompartmental osteoarthritis. Electronically Signed   By: Corrie Mckusick D.O.   On: 07/15/2018 13:20   Scheduled  Meds: . feeding supplement (ENSURE ENLIVE)  237 mL Oral BID BM  . feeding supplement (PRO-STAT SUGAR FREE 64)  30 mL Oral Daily  . ferrous gluconate  324 mg Oral BID WC  . multivitamin with minerals  1 tablet Oral Daily  . sodium chloride flush  3 mL Intravenous Q12H   Continuous Infusions:   LOS: 9 days   Kerney Elbe, DO [facility] PAGER is on AMION  If 7PM-7AM, please contact night-coverage [email or web address] Password [facility] 07/15/2018, 5:33 PM

## 2018-07-15 NOTE — Plan of Care (Signed)
Pt's pain is being adequately managed w/ current med regimen.

## 2018-07-15 NOTE — Progress Notes (Signed)
CCMD notified RN of pt HR sustaining 130s-140s. MD was notified. New orders were placed. Will continue to closely monitor the pt.

## 2018-07-16 LAB — COMPREHENSIVE METABOLIC PANEL
ALK PHOS: 72 U/L (ref 38–126)
ALT: 23 U/L (ref 0–44)
ANION GAP: 9 (ref 5–15)
AST: 43 U/L — ABNORMAL HIGH (ref 15–41)
Albumin: 2.6 g/dL — ABNORMAL LOW (ref 3.5–5.0)
BILIRUBIN TOTAL: 0.8 mg/dL (ref 0.3–1.2)
BUN: 7 mg/dL (ref 6–20)
CALCIUM: 8.6 mg/dL — AB (ref 8.9–10.3)
CO2: 24 mmol/L (ref 22–32)
Chloride: 104 mmol/L (ref 98–111)
Creatinine, Ser: 1.18 mg/dL — ABNORMAL HIGH (ref 0.44–1.00)
GFR, EST NON AFRICAN AMERICAN: 58 mL/min — AB (ref >60)
GLUCOSE: 85 mg/dL (ref 70–99)
Potassium: 4.7 mmol/L (ref 3.5–5.1)
Sodium: 137 mmol/L (ref 135–145)
Total Protein: 6.9 g/dL (ref 6.5–8.1)

## 2018-07-16 LAB — CBC WITH DIFFERENTIAL/PLATELET
Abs Immature Granulocytes: 0.02 10*3/uL (ref 0.00–0.07)
BASOS PCT: 1 %
Basophils Absolute: 0.1 10*3/uL (ref 0.0–0.1)
EOS ABS: 0.3 10*3/uL (ref 0.0–0.5)
EOS PCT: 5 %
HEMATOCRIT: 35.7 % — AB (ref 36.0–46.0)
Hemoglobin: 10.1 g/dL — ABNORMAL LOW (ref 12.0–15.0)
Immature Granulocytes: 0 %
LYMPHS ABS: 1.6 10*3/uL (ref 0.7–4.0)
Lymphocytes Relative: 25 %
MCH: 24 pg — AB (ref 26.0–34.0)
MCHC: 28.3 g/dL — AB (ref 30.0–36.0)
MCV: 85 fL (ref 80.0–100.0)
MONOS PCT: 17 %
Monocytes Absolute: 1.1 10*3/uL — ABNORMAL HIGH (ref 0.1–1.0)
Neutro Abs: 3.3 10*3/uL (ref 1.7–7.7)
Neutrophils Relative %: 52 %
Platelets: 407 10*3/uL — ABNORMAL HIGH (ref 150–400)
RBC: 4.2 MIL/uL (ref 3.87–5.11)
RDW: 23.2 % — ABNORMAL HIGH (ref 11.5–15.5)
WBC: 6.3 10*3/uL (ref 4.0–10.5)
nRBC: 0 % (ref 0.0–0.2)

## 2018-07-16 LAB — PHOSPHORUS: PHOSPHORUS: 3.9 mg/dL (ref 2.5–4.6)

## 2018-07-16 LAB — MAGNESIUM: Magnesium: 1.7 mg/dL (ref 1.7–2.4)

## 2018-07-16 MED ORDER — METOPROLOL TARTRATE 25 MG PO TABS
25.0000 mg | ORAL_TABLET | Freq: Two times a day (BID) | ORAL | Status: DC
  Administered 2018-07-16: 25 mg via ORAL
  Filled 2018-07-16: qty 1

## 2018-07-16 MED ORDER — SODIUM CHLORIDE 0.9 % IV BOLUS
500.0000 mL | Freq: Once | INTRAVENOUS | Status: AC
  Administered 2018-07-16: 500 mL via INTRAVENOUS

## 2018-07-16 NOTE — Progress Notes (Signed)
Physical Therapy Treatment Patient Details Name: Dominique [name] MRN: [id number] DOB: [birth year] Today's Date: 07/16/2018    History of Present Illness 37 y.o. 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 Dx of hypovolemia, sepsis, cellulitis    PT Comments    Pt's mobility has improved to Supervision level and tolerated amb a functional household distance of 50 feet with minimal need for walker.  Discussed case with LPT, pt performing at a safe mobility level to D/C back home with rec for [facility] PT.    Follow Up Recommendations  Home health PT     Equipment Recommendations  Rolling walker with 5" wheels;3in1 (PT)    Recommendations for Other Services       Precautions / Restrictions Precautions Precautions: Fall Precaution Comments: colostomy; monitor HR Restrictions Weight Bearing Restrictions: No    Mobility  Bed Mobility Overal bed mobility: Modified Independent Bed Mobility: Supine to Sit;Sit to Supine     Supine to sit: Supervision;HOB elevated Sit to supine: Min guard   General bed mobility comments: pt able to get self OOB with increased time   Transfers Overall transfer level: Needs assistance Equipment used: None Transfers: Sit to/from [facility] Sit to Stand: Supervision Stand pivot transfers: Supervision       General transfer comment: required "set up" only  able to self perform transfer from bed to [facility].  Able to self perform peri care front and back while standing after voiding.    Ambulation/Gait Ambulation/Gait assistance: Supervision Gait Distance (Feet): 50 Feet(amb from her room to window at end of hallway then back ) Assistive device: Rolling walker (2 wheeled)(lite use) Gait Pattern/deviations: Step-through  pattern;Decreased stride length Gait velocity: decreased but functional   General Gait Details: tolerated amb an increased distance but HR increased to 164.  Pt asymptomatic with no signs of distress   Stairs             Wheelchair Mobility    Modified Rankin (Stroke Patients Only)       Balance Overall balance assessment: Needs assistance   Sitting balance-Leahy Scale: Good     Standing balance support: No upper extremity supported Standing balance-Leahy Scale: Fair Standing balance comment: Static standing prior to transfer to chair                            Cognition Arousal/Alertness: Awake/alert Behavior During Therapy: Flat affect Overall Cognitive Status: Within Functional Limits for tasks assessed                                 General Comments: appears uninterested       Exercises      General Comments        Pertinent Vitals/Pain Pain Assessment: 0-10 Pain Score: 7  Pain Location: peri area  Pain Descriptors / Indicators: Burning Pain Intervention(s): Monitored during session    Home Living                      Prior Function            PT Goals (current goals can now be found in the care plan section) Acute Rehab PT Goals Patient Stated Goal: Wants  headache to go away Progress towards PT goals: Progressing toward goals    Frequency    Min 3X/week      PT Plan Discharge plan needs to be updated(discussed with LPT pt performing at a level safe to D/C to home with family support )    Co-evaluation              AM-PAC PT "6 Clicks" Daily Activity  Outcome Measure  Difficulty turning over in bed (including adjusting bedclothes, sheets and blankets)?: A Little Difficulty moving from lying on back to sitting on the side of the bed? : A Little Difficulty sitting down on and standing up from a chair with arms (e.g., wheelchair, bedside commode, etc,.)?: A Little Help needed moving to and  from a bed to chair (including a wheelchair)?: A Little Help needed walking in hospital room?: A Little Help needed climbing 3-5 steps with a railing? : A Little 6 Click Score: 18    End of Session   Activity Tolerance: Patient tolerated treatment well Patient left: in chair;with call bell/phone within reach   PT Visit Diagnosis: Difficulty in walking, not elsewhere classified (R26.2)     Time: 5910-2890 PT Time Calculation (min) (ACUTE ONLY): 25 min  Charges:  $Gait Training: 8-22 mins $Therapeutic Activity: 8-22 mins                     Rica Koyanagi  PTA Acute  Rehabilitation Services Pager      [phone] Office      [phone]

## 2018-07-16 NOTE — Progress Notes (Signed)
Pt refused CPAP qhs.  Pt encouraged to contact RT should she change her mind.

## 2018-07-16 NOTE — Progress Notes (Addendum)
PROGRESS NOTE    Dominique [name]  VVO:[id number] DOB: Jun 21, 1981 DOA: 07/04/2018 PCP: Benito Mccreedy, MD  Brief Narrative:  [name] is an 37 y.o.femalepast medical history significant for morbid obesity obstructive sleep apnea Crohn's, status post perforation with colostomy chronic wounds and abdomen and bilateral groin, who was in his usual health until she developed recurrent nausea and vomiting several days ago. She presented to the ED for lightheaded sensation when she was sitting or standing up. HR noted to be 140s, sinus tachycardia. Now treated for 7 days of Abx for multiple wounds with associated cellulitis and Sepsis. Awaiting SNF placement as she has no bed offers or payor source.  Assessment & Plan:   Principal Problem:   Hypovolemia Active Problems:   Orthostatic hypotension   Crohn's colitis with perforation s/p left colectomy/colostomy 2016   OSA on CPAP   Chronic anemia   Multiple wounds of skin   Mild renal insufficiency   Hyponatremia   Malnutrition of moderate degree   Dehydration  Hypovolemia/ Orthostatic hypotension/ Hyponatremia -after aggressive IV hydration, orthostatic hypotension & hyponatremia have resolved- she is eating and drinking appropriately now but will resumed IVF as Cr trending back up and will continue through today  -See Below  Anemia due to acute blood loss and Iron deficiency - has bleeding from wounds and her periods - Hb dropped from 9.4 >> 7.9   - Iron level 14, Ferretin was 15 on 9/19 - 10/17>  given 1 U PRBC and a dose of IV Iron - started oral Iron - Hb/Hct is now stable at 26.9/48.5  Metabolic acidosis/ AKI - Suspected prerenal - treated with IVF and Bicarb tabs - Cr had improved- Bicarb improved to 24   - Cr slightly up as BUN/Cr is now 7/1.18 and now Bicarb has been discontinued -Restarted IVF hydration with NS at 75 mL/hr yesterday and will continue through today and likely D/C in AM    Crohn's colitis with  perforation s/p left colectomy/colostomy 2016 -Continue to follow stool output to ensure we are matching her output with oral and IV fluids -Resumed IVF hydration yesterday   Severe wounds with cellulitis and sepsis - has chronic wounds in abdomen from prior surgery and new wounds due to moisture damage to skin in skin folds/ panus and vaginal area which are full thickness wounds  - added Oxycodone and PRN Dilaudid which appears to be helping -S/p Completion of Bactrim Course  Indwelling urinary catheter - to prevent soiling of wounds -Discontinued yesterday and patient able to urinate well.   Loss of appetite/ mod protein calorie malnutrition - has not been eating much at home and has lost about 30 lbs - Continue supplements with Ensure and live to 37 mils p.o. twice daily between meals and Protostat 30 mL's p.o. daily  OSA -On CPAP   Super Morbid Obesity -Estimated body mass index is 64.02 kg/m as calculated from the following:   Height as of this encounter: 5' 2"  (1.575 m).   Weight as of this encounter: 158.8 kg. -Weight loss Counseling given   Severe Deconditioning -Meets criteria to get rehab at SNF which she is in agreement with but is not getting insurance approval -Not appropriate for CIR -PT/OT Recommending SNF initially but now has improved and recommending Home Health however patient unable to go Home with Home Health given her current wound care status and location.  Patient has no visible caregivers and has tried to encompass home health in the past and patient's wounds  have gotten worse so she requires a skilled nursing facility placement even though she worked well and has improved mobility with PT  Sinus Tachycardia -In the setting of Severe Deconditioning and Dehydration -Gave IVF Bolus yesterday and another today ; Continue with IVF Hydration as above -Renewed Telemetry -Will try low dose Metoprolol 25 mg po BID   Right Eye Erythema and Swelling,  improving  -? From using a dirty rag to wipe eye -Discussed case with Opthamology Dr. Nancy Fetter who recommends using artificial tears currently for supportive care and removal of the direct -If is not improving ophthalmology consult in few days  Thrombocytosis -Likely reactive but improving -Patient to count went from 434 on 07/05/2018 is now 407 -Continue monitor and repeat CBC in a.m.  Bilateral Knee Warmth and Swelling -Right Knee worse than Left -X-Rays were un-revealing  -Continue to Monitor and Up with Therapy   DVT prophylaxis: SCD's Code Status: FULL CODE Family Communication: No family present at bedside  Disposition Plan: SNF when available   Consultants:   Discussed the case with ophthalmology Dr. Nancy Fetter   Procedures: None   Antimicrobials:  Anti-infectives (From admission, onward)   Start     Dose/Rate Route Frequency Ordered Stop   07/09/18 0000  sulfamethoxazole-trimethoprim (BACTRIM) 400-80 MG tablet     1 tablet Oral 2 times daily 07/09/18 1619     07/07/18 1000  vancomycin (VANCOCIN) 1,500 mg in sodium chloride 0.9 % 500 mL IVPB  Status:  Discontinued     1,500 mg 250 mL/hr over 120 Minutes Intravenous Every 24 hours 07/06/18 1014 07/07/18 0938   07/07/18 1000  sulfamethoxazole-trimethoprim (BACTRIM DS,SEPTRA DS) 800-160 MG per tablet 2 tablet  Status:  Discontinued     2 tablet Oral Every 12 hours 07/07/18 0938 07/14/18 0734   07/06/18 1200  cefTRIAXone (ROCEPHIN) 2 g in sodium chloride 0.9 % 100 mL IVPB  Status:  Discontinued     2 g 200 mL/hr over 30 Minutes Intravenous Every 24 hours 07/06/18 0948 07/07/18 0938   07/06/18 1100  doxycycline (VIBRAMYCIN) 100 mg in sodium chloride 0.9 % 250 mL IVPB  Status:  Discontinued     100 mg 125 mL/hr over 120 Minutes Intravenous Every 12 hours 07/06/18 0915 07/06/18 1002   07/06/18 1015  vancomycin (VANCOCIN) 2,500 mg in sodium chloride 0.9 % 500 mL IVPB     2,500 mg 250 mL/hr over 120 Minutes Intravenous  Once 07/06/18  1014 07/07/18 0826     Subjective: Seen and examined at bedside and states that her abdomen and wounds were hurting a little bit.  No chest pain, lightheadedness or dizziness.  Able to urinate on her own now.  Patient ambulatory with physical therapy today.  Discussed with her about using dirty rags to wipe her eyes and she understands will not do this.  No other concerns or complaints at this time.  She worked well with physical therapy and they changed her recommendations to go home but patient cannot safely be discharged home due to her level of wound care and location of her wounds.  She has tried a home health agency prior to admission and her wounds got worse.  Objective: Vitals:   07/15/18 2031 07/16/18 0519 07/16/18 1115 07/16/18 1125  BP: 107/72 132/61 139/69   Pulse: (!) 122 (!) 109 (!) 110   Resp:  18    Temp: 98.6 F (37 C)     TempSrc: Oral     SpO2: 97% 99% 95% 97%  Weight:  (!) 158.8 kg    Height:        Intake/Output Summary (Last 24 hours) at 07/16/2018 1525 Last data filed at 07/16/2018 1524 Gross per 24 hour  Intake 478.62 ml  Output 350 ml  Net 128.62 ml   Filed Weights   07/14/18 0645 07/15/18 0630 07/16/18 0519  Weight: (!) 159.2 kg (!) 157.4 kg (!) 158.8 kg   Examination: Physical Exam:  Constitutional: Well-nourished, well-developed extremely morbidly obese African-American female currently no acute distress but appears uncomfortable laying in bed Eyes: Right eye is tinnitus swollen or erythematous.  Left eye is also swollen slightly. ENMT: External ears nose appear normal.  Grossly normal hearing.  Mucous members moist Neck: Appears supple.  Very difficult to assess for JVD given body habitus Respiratory: Diminished to auscultation bilaterally no appreciable wheezing, rales, rhonchi.  Normal respiratory effort and patient not sleeping using accessory muscles to breathe Cardiovascular: Tachycardic rate but regular rhythm.  No appreciable murmurs, rubs or  gallops.  Has no appreciable edema Abdomen: Soft, nontender, distended significantly.  Bowel sounds present.  Has wounds noted as below GU: Deferred.  Foley catheter has been removed Musculoskeletal: No contractures or cyanosis.  Normal strength Skin: Extensive full-thickness wound in the abdomen in the groin area that are covered.  Skin is warm and dry.  Has right eye erythema is improved from yesterday Neurologic: Renal nerves II through XII grossly intact no appreciable focal deficits. Psychiatric: Normal mood and affect.  Intact judgment insight.  Patient is awake, alert and oriented x3  Data Reviewed: I have personally reviewed following labs and imaging studies  CBC: Recent Labs  Lab 07/09/18 2226 07/14/18 0536 07/15/18 1038 07/16/18 0534  WBC  --  6.8 6.3 6.3  NEUTROABS  --   --  3.0 3.3  HGB 9.6* 9.9* 10.3* 10.1*  HCT 30.8* 33.5* 34.8* 35.7*  MCV  --  81.7 82.7 85.0  PLT  --  486* 461* 242*   Basic Metabolic Panel: Recent Labs  Lab 07/10/18 0530 07/11/18 0532 07/14/18 0536 07/15/18 1038 07/16/18 0534  NA 137 136 133* 132* 137  K 4.6 4.5 4.6 4.5 4.7  CL 108 105 103 99 104  CO2 20* 23 23 23 24   GLUCOSE 91 88 87 96 85  BUN 6 5* 7 8 7   CREATININE 0.88 0.87 1.04* 1.13* 1.18*  CALCIUM 8.5* 8.4* 8.8* 8.9 8.6*  MG  --   --   --  1.9 1.7  PHOS  --   --   --  3.8 3.9   GFR: Estimated Creatinine Clearance: 95.5 mL/min (A) (by C-G formula based on SCr of 1.18 mg/dL (H)). Liver Function Tests: Recent Labs  Lab 07/15/18 1038 07/16/18 0534  AST 31 43*  ALT 21 23  ALKPHOS 83 72  BILITOT 0.9 0.8  PROT 7.5 6.9  ALBUMIN 2.8* 2.6*   No results for input(s): LIPASE, AMYLASE in the last 168 hours. No results for input(s): AMMONIA in the last 168 hours. Coagulation Profile: No results for input(s): INR, PROTIME in the last 168 hours. Cardiac Enzymes: No results for input(s): CKTOTAL, CKMB, CKMBINDEX, TROPONINI in the last 168 hours. BNP (last 3 results) No results for  input(s): PROBNP in the last 8760 hours. HbA1C: No results for input(s): HGBA1C in the last 72 hours. CBG: No results for input(s): GLUCAP in the last 168 hours. Lipid Profile: No results for input(s): CHOL, HDL, LDLCALC, TRIG, CHOLHDL, LDLDIRECT in the last 72 hours. Thyroid Function  Tests: No results for input(s): TSH, T4TOTAL, FREET4, T3FREE, THYROIDAB in the last 72 hours. Anemia Panel: No results for input(s): VITAMINB12, FOLATE, FERRITIN, TIBC, IRON, RETICCTPCT in the last 72 hours. Sepsis Labs: No results for input(s): PROCALCITON, LATICACIDVEN in the last 168 hours.  No results found for this or any previous visit (from the past 240 hour(s)).   Radiology Studies: Dg Knee 1-2 Views Left  Result Date: 07/15/2018 CLINICAL DATA:  37 year old female with a history of both knees warm to the touch EXAM: LEFT KNEE - 1-2 VIEW COMPARISON:  None. FINDINGS: No acute displaced fracture. No erosive changes. Medial greater than lateral joint space narrowing with marginal osteophyte formation. Degenerative changes at the patellofemoral joint. No joint effusion. IMPRESSION: Negative for acute bony abnormality. No evidence of joint effusion. Tricompartmental osteoarthritis. Electronically Signed   By: Corrie Mckusick D.O.   On: 07/15/2018 13:17   Dg Knee 1-2 Views Right  Result Date: 07/15/2018 CLINICAL DATA:  37 year old female with a history of knees warm to the touch EXAM: RIGHT KNEE - 1-2 VIEW COMPARISON:  None. FINDINGS: No acute displaced fracture. No erosive changes. Medial greater than lateral joint space narrowing. Marginal osteophyte formation. No evidence of joint effusion. Degenerative changes of the patellofemoral joint. No radiopaque foreign body. IMPRESSION: No acute bony abnormality. No joint effusion.  Tricompartmental osteoarthritis. Electronically Signed   By: Corrie Mckusick D.O.   On: 07/15/2018 13:20   Scheduled Meds: . feeding supplement (ENSURE ENLIVE)  237 mL Oral BID BM  .  feeding supplement (PRO-STAT SUGAR FREE 64)  30 mL Oral Daily  . ferrous gluconate  324 mg Oral BID WC  . multivitamin with minerals  1 tablet Oral Daily  . sodium chloride flush  3 mL Intravenous Q12H   Continuous Infusions: . sodium chloride 75 mL/hr at 07/16/18 1354    LOS: 10 days   Kerney Elbe, DO [facility] PAGER is on AMION  If 7PM-7AM, please contact night-coverage [email or web address] Password [email or web address] 07/16/2018, 3:25 PM

## 2018-07-16 NOTE — Progress Notes (Signed)
Occupational Therapy Treatment Patient Details Name: Julisa Flippo MRN: [id number] DOB: [birth year] Today's Date: 07/16/2018    History of present illness 37 y.o. 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 Dx of hypovolemia, sepsis, cellulitis   OT comments  Pt participating in ADL retraining session today and demonstrating increased independence with bed mobility, SPT  and grooming tasks sitting up at EOB with increased time for all tasks and slow pace. Cont toward plan of care and goals for acute OT.   Follow Up Recommendations       Equipment Recommendations  3 in 1 bedside commode(Bariatric 3:1)    Recommendations for Other Services      Precautions / Restrictions Precautions Precautions: Fall Precaution Comments: colostomy; monitor HR Restrictions Weight Bearing Restrictions: No       Mobility Bed Mobility Overal bed mobility: Needs Assistance Bed Mobility: Supine to Sit;Sit to Supine     Supine to sit: Supervision;HOB elevated Sit to supine: Min guard   General bed mobility comments: HOB flat when getting basck to bed, increased time. Pt demonstrating increased independence.  Transfers Overall transfer level: Needs assistance Equipment used: None Transfers: Stand Pivot Transfers;Sit to/from Stand Sit to Stand: Supervision;Min guard(Close supervision-Min guard) Stand pivot transfers: Supervision;Min guard(Close supervision-min guard)            Balance Overall balance assessment: Needs assistance   Sitting balance-Leahy Scale: Good     Standing balance support: No upper extremity supported Standing balance-Leahy Scale: Fair Standing balance comment: Static standing prior to transfer to chair                           ADL  either performed or assessed with clinical judgement   ADL Overall ADL's : Needs assistance/impaired Eating/Feeding: Set up;Bed level;Sitting   Grooming: Wash/dry hands;Wash/dry face;Oral care;Sitting Grooming Details (indicate cue type and reason): Sitting up at EOB             Lower Body Dressing: Maximal assistance Lower Body Dressing Details (indicate cue type and reason): Max A to don socks prrior to transfer, pt able to doff left sock sitting up in bed, but unable to doff Right. Toilet Transfer: Min guard;Stand-pivot;Ambulation(Simulated sit to stand from EOB to recliner and back to EOB) Toilet Transfer Details (indicate cue type and reason): Did not use RW/declined           General ADL Comments: Pt seen for OT ADL retraining session with focus on bed mobility (overall supervision getting into/out and back into bed - increased time/slower pace); simulated toilet transfer (Min guard assist for sit to stand from EOB and SPT to recliner and back to sitting EOB - pt sis not use RW, declined); and Grooming sitting up at EOB. Pt requires increased time for tasks but progressing.     Vision Patient Visual Report: No change from baseline     Perception     Praxis      Cognition Arousal/Alertness: Awake/alert Behavior During Therapy: WFL for tasks assessed/performed Overall Cognitive Status: Within Functional Limits for tasks assessed  Exercises     Shoulder Instructions       General Comments      Pertinent Vitals/ Pain       Pain Assessment: 0-10 Pain Score: 9  Pain Location: Headache Pain Descriptors / Indicators: Aching Pain Intervention(s): Premedicated before session;Monitored during session;Limited activity within patient's tolerance;Repositioned  Home Living                                          Prior Functioning/Environment              Frequency  Min 2X/week         Progress Toward Goals  OT Goals(current goals can now be found in the care plan section)  Progress towards OT goals: Progressing toward goals  Acute Rehab OT Goals Patient Stated Goal: Wants headache to go away  Plan Discharge plan remains appropriate    Co-evaluation                 AM-PAC PT "6 Clicks" Daily Activity     Outcome Measure   Help from another person eating meals?: None Help from another person taking care of personal grooming?: A Little Help from another person toileting, which includes using toliet, bedpan, or urinal?: A Lot Help from another person bathing (including washing, rinsing, drying)?: A Lot Help from another person to put on and taking off regular upper body clothing?: A Little Help from another person to put on and taking off regular lower body clothing?: A Lot 6 Click Score: 16    End of Session    OT Visit Diagnosis: Unsteadiness on feet (R26.81);Muscle weakness (generalized) (M62.81)   Activity Tolerance Patient tolerated treatment well   Patient Left in bed;with call bell/phone within reach   Nurse Communication Other (comment)(Pt performed grooming tasks and requested OJ with ice)        Time: 6773-7366 OT Time Calculation (min): 26 min  Charges: OT General Charges $OT Visit: 1 Visit OT Treatments $Self Care/Home Management : 23-37 mins    [place], Amy Beth Dixon , OTR/L 07/16/2018, 9:21 AM

## 2018-07-16 NOTE — Plan of Care (Signed)
  Problem: Clinical Measurements: Goal: Will remain free from infection Outcome: Progressing Goal: Diagnostic test results will improve Outcome: Progressing Goal: Respiratory complications will improve Outcome: Progressing Goal: Cardiovascular complication will be avoided Outcome: Progressing   Problem: Pain Managment: Goal: General experience of comfort will improve Outcome: Progressing   Problem: Skin Integrity: Goal: Risk for impaired skin integrity will decrease Outcome: Progressing

## 2018-07-16 NOTE — Progress Notes (Signed)
CSW following to assist with discharge planning to SNF.  Patient currently has no bed offers or payor source for SNF. CSW Surveyor, quantity assisting with SNF placement for patient. CSW Surveyor, quantity advised CSW to contact [facility] [phone]) to inquire to offer patient a bed under a 30 day letter of guarantee. CSW contacted [facility] and left voicemail for admissions staff requesting return phone call.  CSW will continue to follow and assist with discharge planning.  Abundio Miu, [place] Social Worker [facility] Cell#: [phone]

## 2018-07-17 LAB — COMPREHENSIVE METABOLIC PANEL
ALK PHOS: 74 U/L (ref 38–126)
ALT: 21 U/L (ref 0–44)
AST: 34 U/L (ref 15–41)
Albumin: 2.5 g/dL — ABNORMAL LOW (ref 3.5–5.0)
Anion gap: 7 (ref 5–15)
BUN: 8 mg/dL (ref 6–20)
CALCIUM: 8.5 mg/dL — AB (ref 8.9–10.3)
CO2: 23 mmol/L (ref 22–32)
CREATININE: 1 mg/dL (ref 0.44–1.00)
Chloride: 106 mmol/L (ref 98–111)
GFR calc non Af Amer: >60 mL/min (ref >60)
GLUCOSE: 90 mg/dL (ref 70–99)
Potassium: 4 mmol/L (ref 3.5–5.1)
SODIUM: 136 mmol/L (ref 135–145)
Total Bilirubin: 0.7 mg/dL (ref 0.3–1.2)
Total Protein: 6.3 g/dL — ABNORMAL LOW (ref 6.5–8.1)

## 2018-07-17 LAB — CBC WITH DIFFERENTIAL/PLATELET
Abs Immature Granulocytes: 0.02 10*3/uL (ref 0.00–0.07)
BASOS ABS: 0.1 10*3/uL (ref 0.0–0.1)
Basophils Relative: 1 %
EOS PCT: 6 %
Eosinophils Absolute: 0.3 10*3/uL (ref 0.0–0.5)
HCT: 32.6 % — ABNORMAL LOW (ref 36.0–46.0)
HEMOGLOBIN: 9.4 g/dL — AB (ref 12.0–15.0)
Immature Granulocytes: 0 %
Lymphocytes Relative: 28 %
Lymphs Abs: 1.5 10*3/uL (ref 0.7–4.0)
MCH: 24.5 pg — AB (ref 26.0–34.0)
MCHC: 28.8 g/dL — ABNORMAL LOW (ref 30.0–36.0)
MCV: 85.1 fL (ref 80.0–100.0)
Monocytes Absolute: 0.8 10*3/uL (ref 0.1–1.0)
Monocytes Relative: 14 %
NRBC: 0 % (ref 0.0–0.2)
Neutro Abs: 2.8 10*3/uL (ref 1.7–7.7)
Neutrophils Relative %: 51 %
Platelets: 389 10*3/uL (ref 150–400)
RBC: 3.83 MIL/uL — ABNORMAL LOW (ref 3.87–5.11)
RDW: 22.9 % — ABNORMAL HIGH (ref 11.5–15.5)
WBC: 5.5 10*3/uL (ref 4.0–10.5)

## 2018-07-17 LAB — MAGNESIUM: Magnesium: 1.7 mg/dL (ref 1.7–2.4)

## 2018-07-17 LAB — PHOSPHORUS: PHOSPHORUS: 3.3 mg/dL (ref 2.5–4.6)

## 2018-07-17 MED ORDER — METOPROLOL TARTRATE 25 MG PO TABS
50.0000 mg | ORAL_TABLET | Freq: Two times a day (BID) | ORAL | Status: DC
  Administered 2018-07-17 – 2018-08-23 (×52): 50 mg via ORAL
  Filled 2018-07-17 (×14): qty 1
  Filled 2018-07-17: qty 2
  Filled 2018-07-17 (×3): qty 1
  Filled 2018-07-17: qty 2
  Filled 2018-07-17 (×7): qty 1
  Filled 2018-07-17: qty 2
  Filled 2018-07-17 (×19): qty 1
  Filled 2018-07-17 (×2): qty 2
  Filled 2018-07-17 (×6): qty 1
  Filled 2018-07-17: qty 2
  Filled 2018-07-17 (×2): qty 1
  Filled 2018-07-17: qty 2
  Filled 2018-07-17 (×6): qty 1
  Filled 2018-07-17 (×2): qty 2
  Filled 2018-07-17: qty 1

## 2018-07-17 NOTE — Progress Notes (Signed)
[facility] has faxed a documented note in support of SNF,& their reasons why they cannot take patient back for [facility], this note is in the shadow chart.

## 2018-07-17 NOTE — Care Management Note (Signed)
Case Management Note  Patient Details  Name: Dominique [name] MRN: [id number] Date of Birth: [birth year]  Subjective/Objective: crohn's,ileostomy, wt-over 350lbs.Spoke to [facility] agency-they confirmed they are unable to take patient back d/t no available caregiver,location of wounds,& size-they follow [place] guidelines that require an available caregiver to teach & patient does not have that in place-they recommend a SNF is the best d/c plan. Patient has had 3 readmissions in past 6 months d/t these wounds. WOC is following for wound care & ongoing assessments-recc for documentation of how long estimate for healing. PT-recc HHC. The medical reasons are appropriate for SNF.Not appropriate for LTAC/CIR/HHC.Patient agree to SNF.CSW following for SNF.                   Action/Plan:d/c SNF.   Expected Discharge Date:  07/09/18               Expected Discharge Plan:  Skilled Nursing Facility  In-House Referral:  Clinical Social Work  Discharge planning Services  CM Consult  Post Acute Care Choice:  Home Health(Active w/[facility]) Choice offered to:     DME Arranged:    DME Agency:     HH Arranged:    [place] Agency:     Status of Service:  In process, will continue to follow  If discussed at Long Length of Stay Meetings, dates discussed:    Additional Comments:  Dominique Phi, RN 07/17/2018, 2:10 PM

## 2018-07-17 NOTE — Progress Notes (Signed)
Physical Therapy Treatment Patient Details Name: Dominique [name] MRN: [id number] DOB: Jul 19, 1981 Today's Date: 07/17/2018    History of Present Illness 37 y.o. 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 Dx of hypovolemia, sepsis, cellulitis    PT Comments    Pt OOB in recliner.  Assisted with amb a greater distance without walker(holding IV pole).  Pt progressing well with her mobility.   Follow Up Recommendations  Home health PT     Equipment Recommendations  Rolling walker with 5" wheels;3in1 (PT)    Recommendations for Other Services       Precautions / Restrictions Precautions Precautions: Fall Precaution Comments: colostomy; monitor HR Restrictions Weight Bearing Restrictions: No    Mobility  Bed Mobility               General bed mobility comments: OOB in recliner   Transfers Overall transfer level: Needs assistance Equipment used: None Transfers: Sit to/from Stand;Stand Pivot Transfers Sit to Stand: Supervision Stand pivot transfers: Supervision       General transfer comment: able to self rise and lower to recliner with increased time  Ambulation/Gait Ambulation/Gait assistance: Supervision Gait Distance (Feet): 95 Feet Assistive device: IV Pole   Gait velocity: decreased but functional   General Gait Details: "can we try without the walker?" stated pt.  Pt tolerated an increased distance holding to IV pole for comfort.  HR prior to amb 95.  HR with activity 117.  Much improved from yesterday.     Stairs             Wheelchair Mobility    Modified Rankin (Stroke Patients Only)       Balance                                            Cognition Arousal/Alertness: Awake/alert Behavior During  Therapy: WFL for tasks assessed/performed Overall Cognitive Status: Within Functional Limits for tasks assessed                                 General Comments: more conversasive this session      Exercises      General Comments        Pertinent Vitals/Pain Pain Assessment: Faces Faces Pain Scale: Hurts little more Pain Location: peri area  Pain Descriptors / Indicators: Burning Pain Intervention(s): Monitored during session;Patient requesting pain meds-RN notified    Home Living                      Prior Function            PT Goals (current goals can now be found in the care plan section) Progress towards PT goals: Progressing toward goals    Frequency    Min 3X/week      PT Plan Discharge plan needs to be updated    Co-evaluation              AM-PAC PT "6 Clicks" Daily Activity  Outcome Measure  Difficulty turning over in bed (including adjusting bedclothes, sheets and blankets)?: A Little Difficulty moving from lying on back to sitting on the  side of the bed? : A Little Difficulty sitting down on and standing up from a chair with arms (e.g., wheelchair, bedside commode, etc,.)?: A Little Help needed moving to and from a bed to chair (including a wheelchair)?: A Little Help needed walking in hospital room?: A Little Help needed climbing 3-5 steps with a railing? : A Lot 6 Click Score: 17    End of Session Equipment Utilized During Treatment: Gait belt Activity Tolerance: Patient tolerated treatment well Patient left: in chair;with call bell/phone within reach Nurse Communication: Patient requests pain meds PT Visit Diagnosis: Difficulty in walking, not elsewhere classified (R26.2)     Time: 1450-1505 PT Time Calculation (min) (ACUTE ONLY): 15 min  Charges:  $Gait Training: 8-22 mins                     Rica Koyanagi  PTA Acute  Rehabilitation Services Pager      [phone] Office      [phone]

## 2018-07-17 NOTE — Progress Notes (Signed)
PROGRESS NOTE    Dominique [name]  QPR:[id number] DOB: 12-24-1980 DOA: 07/04/2018 PCP: Benito Mccreedy, MD  Brief Narrative:  Dominique [name] is an 37 y.o.femalepast medical history significant for morbid obesity obstructive sleep apnea Crohn's, status post perforation with colostomy chronic wounds and abdomen and bilateral groin, who was in his usual health until she developed recurrent nausea and vomiting several days ago. She presented to the ED for lightheaded sensation when she was sitting or standing up. HR noted to be 140s, sinus tachycardia. Now treated for 7 days of Abx for multiple wounds with associated cellulitis and Sepsis. Awaiting SNF placement as she has no bed offers or payor source and cannot go home with Home Health due to extensive wound care.  Assessment & Plan:   Principal Problem:   Hypovolemia Active Problems:   Orthostatic hypotension   Crohn's colitis with perforation s/p left colectomy/colostomy 2016   OSA on CPAP   Chronic anemia   Multiple wounds of skin   Mild renal insufficiency   Hyponatremia   Malnutrition of moderate degree   Dehydration  Hypovolemia/ Orthostatic hypotension/ Hyponatremia -after aggressive IV hydration, orthostatic hypotension & hyponatremia have resolved- she is eating and drinking appropriately now but resumed IVF as Cr was trending back up and will continue through today at 50 mL's per hour -See Below  Anemia due to acute blood loss and Iron deficiency - has bleeding from wounds and her periods - Hb dropped from 9.4 >> 7.9   - Iron level 14, Ferretin was 15 on 9/19 - 10/17>  given 1 U PRBC and a dose of IV Iron - started oral Iron and will continue Ferrous Gluconate 324 mg po BID - Hb/Hct went from 10.1/35.7 -> 9.4/32.6 (Dilutional Drop) -Continue to monitor for signs and symptoms of bleeding -Repeat CBC in a.m.  Metabolic acidosis/ AKI - Suspected prerenal - treated with IVF and Bicarb tabs - Cr had improved- Bicarb  improved to 24   - Cr slightly up as BUN/Cr was 7/1.18 but now improved to 8/1.00 -Bicarbonate has stopped -Restarted IVF hydration with NS at 75 mL/hr yesterday and will continue through today at 50 mL's per hour and stop in the a.m.   Crohn's colitis with perforation s/p left colectomy/colostomy 2016 -Continue to follow stool output to ensure we are matching her output with oral and IV fluids -Resumed IVF hydration yesterday   Severe wounds with cellulitis and sepsis - has chronic wounds in abdomen from prior surgery and new wounds due to moisture damage to skin in skin folds/ panus and vaginal area which are full thickness wounds  - added Oxycodone and PRN Dilaudid for pain not tolerable to p.o. medication which appears to be helping -S/p Completion of Bactrim Course -Wound care per nursing protocol  Indwelling urinary catheter - to prevent soiling of wounds -Discontinued 1023 and patient able to urinate well and is more mobile  Loss of appetite/ mod protein calorie malnutrition - has not been eating much at home and has lost about 30 lbs - Continue supplements with Ensure and live to 37 mils p.o. twice daily between meals and Protostat 30 mL's p.o. daily  OSA -On CPAP; refused CPAP last night  Super Morbid Obesity -Estimated body mass index is 64.2 kg/m as calculated from the following:   Height as of this encounter: 5' 2"  (1.575 m).   Weight as of this encounter: 159.2 kg. -Weight loss Counseling given   Severe Deconditioning -Meets criteria to get rehab at [facility] which  she is in agreement with but is not getting insurance approval -Not appropriate for CIR -PT/OT Recommending SNF initially but now has improved and recommending [facility] however patient unable to go Home with [place] given her current wound care status and location.  Patient has no caregivers and has tried to [facility] in the past and patient's wounds have gotten worse so she requires a  skilled nursing facility placement even though she worked well and has improved mobility with PT  Sinus Tachycardia -In the setting of Severe Deconditioning and Dehydration -Gave IVF Bolus yesterday and another today ; Continue with IVF Hydration as above -Renewed Telemetry -Will try low dose Metoprolol 25 mg po BID   Right Eye Erythema and Swelling, improving  -? From using a dirty rag to wipe eye -Discussed case with Opthamology Dr. Nancy Fetter who recommends using artificial tears currently for supportive care and removal of the dirty rag -If is not improving ophthalmology consult in few days -Patient thinks eyes slightly improved however still very erythematous  Thrombocytosis; improved -Likely reactive now resolved -Patient to count went from 434 on 07/05/2018 is now 389 -Continue monitor and repeat CBC in a.m.  Bilateral Knee Warmth and Swelling, stable -Right Knee worse than Left -X-Rays were un-revealing  -Continue to Monitor and Up with Therapy   DVT prophylaxis: SCD's Code Status: FULL CODE Family Communication: No family present at bedside  Disposition Plan: SNF when available   Consultants:   Discussed the case with ophthalmology Dr. Nancy Fetter   Procedures: None   Antimicrobials:  Anti-infectives (From admission, onward)   Start     Dose/Rate Route Frequency Ordered Stop   07/09/18 0000  sulfamethoxazole-trimethoprim (BACTRIM) 400-80 MG tablet     1 tablet Oral 2 times daily 07/09/18 1619     07/07/18 1000  vancomycin (VANCOCIN) 1,500 mg in sodium chloride 0.9 % 500 mL IVPB  Status:  Discontinued     1,500 mg 250 mL/hr over 120 Minutes Intravenous Every 24 hours 07/06/18 1014 07/07/18 0938   07/07/18 1000  sulfamethoxazole-trimethoprim (BACTRIM DS,SEPTRA DS) 800-160 MG per tablet 2 tablet  Status:  Discontinued     2 tablet Oral Every 12 hours 07/07/18 0938 07/14/18 0734   07/06/18 1200  cefTRIAXone (ROCEPHIN) 2 g in sodium chloride 0.9 % 100 mL IVPB  Status:   Discontinued     2 g 200 mL/hr over 30 Minutes Intravenous Every 24 hours 07/06/18 0948 07/07/18 0938   07/06/18 1100  doxycycline (VIBRAMYCIN) 100 mg in sodium chloride 0.9 % 250 mL IVPB  Status:  Discontinued     100 mg 125 mL/hr over 120 Minutes Intravenous Every 12 hours 07/06/18 0915 07/06/18 1002   07/06/18 1015  vancomycin (VANCOCIN) 2,500 mg in sodium chloride 0.9 % 500 mL IVPB     2,500 mg 250 mL/hr over 120 Minutes Intravenous  Once 07/06/18 1014 07/07/18 0826     Subjective: Seen and examined at bedside and states she is doing okay.  Was being more mobile and walking and actively participating with therapy.  No chest pain, lightheadedness or dizziness.  States that her wounds do hurt so she when her dressings are changed.  Unable to do her own dressing changes and needs a skilled nursing facility.  Thinks is doing a little bit better however still very erythematous and states that when it runs she does dabs it with a tissue now.  Objective: Vitals:   07/17/18 0642 07/17/18 1302 07/17/18 1339 07/17/18 1352  BP: 129/72 (!) 149/132 115/78   Pulse: (!) 109 98 88   Resp: 16 20    Temp: 99.2 F (37.3 C) 98 F (36.7 C)    TempSrc: Oral Oral    SpO2: 97% 97%  97%  Weight:      Height:        Intake/Output Summary (Last 24 hours) at 07/17/2018 1749 Last data filed at 07/17/2018 1400 Gross per 24 hour  Intake 1257.4 ml  Output 551 ml  Net 706.4 ml   Filed Weights   07/15/18 0630 07/16/18 0519 07/17/18 0500  Weight: (!) 157.4 kg (!) 158.8 kg (!) 159.2 kg   Examination: Physical Exam:  Constitutional: Well-nourished, well-developed extremely super morbidly obese African-American female is currently no acute distress appears somewhat uncomfortable laying in the bed Eyes: Right eye is swollen and erythematous.  Left eye slightly swollen but not as erythematous ENMT: External ears and nose appear normal.  Grossly normal hearing.  Mucous members are moist Neck: Appears  supple.  Very difficult to assess for JVD given her body habitus Respiratory: Diminished to auscultation bilaterally no appreciable wheezing, rales, rhonchi.  Patient not tachypneic or using any accessory muscles to breathe Cardiovascular: Tachycardic rate and regular rhythm.  No appreciable murmurs, rubs, gallops.  Has no appreciable pitting lower extremity edema Abdomen: Soft, nontender, distended significantly secondary body habitus.  Bowel sounds present has wounds noted below and also has a colostomy bag with air and brown-colored stool GU: Deferred. Musculoskeletal: Fractures or cyanosis.  Normal strength Skin: Is extensive full-thickness wounds in the abdomen and groin areas are covered.  Skin is warm and dry.  Right eye erythema is still there Neurologic: Cranial nerves II through XII grossly intact no appreciable focal deficits.  Right eye is very erythematous Psychiatric: Normal and pleasant mood and affect.  Intact judgment and insight.  Patient is awake, alert and oriented x3  Data Reviewed: I have personally reviewed following labs and imaging studies  CBC: Recent Labs  Lab 07/14/18 0536 07/15/18 1038 07/16/18 0534 07/17/18 0534  WBC 6.8 6.3 6.3 5.5  NEUTROABS  --  3.0 3.3 2.8  HGB 9.9* 10.3* 10.1* 9.4*  HCT 33.5* 34.8* 35.7* 32.6*  MCV 81.7 82.7 85.0 85.1  PLT 486* 461* 407* 347   Basic Metabolic Panel: Recent Labs  Lab 07/11/18 0532 07/14/18 0536 07/15/18 1038 07/16/18 0534 07/17/18 0534  NA 136 133* 132* 137 136  K 4.5 4.6 4.5 4.7 4.0  CL 105 103 99 104 106  CO2 23 23 23 24 23   GLUCOSE 88 87 96 85 90  BUN 5* 7 8 7 8   CREATININE 0.87 1.04* 1.13* 1.18* 1.00  CALCIUM 8.4* 8.8* 8.9 8.6* 8.5*  MG  --   --  1.9 1.7 1.7  PHOS  --   --  3.8 3.9 3.3   GFR: Estimated Creatinine Clearance: 112.8 mL/min (by C-G formula based on SCr of 1 mg/dL). Liver Function Tests: Recent Labs  Lab 07/15/18 1038 07/16/18 0534 07/17/18 0534  AST 31 43* 34  ALT 21 23 21     ALKPHOS 83 72 74  BILITOT 0.9 0.8 0.7  PROT 7.5 6.9 6.3*  ALBUMIN 2.8* 2.6* 2.5*   No results for input(s): LIPASE, AMYLASE in the last 168 hours. No results for input(s): AMMONIA in the last 168 hours. Coagulation Profile: No results for input(s): INR, PROTIME in the last 168 hours. Cardiac Enzymes: No results for input(s): CKTOTAL, CKMB, CKMBINDEX, TROPONINI in the last 168 hours.  BNP (last 3 results) No results for input(s): PROBNP in the last 8760 hours. HbA1C: No results for input(s): HGBA1C in the last 72 hours. CBG: No results for input(s): GLUCAP in the last 168 hours. Lipid Profile: No results for input(s): CHOL, HDL, LDLCALC, TRIG, CHOLHDL, LDLDIRECT in the last 72 hours. Thyroid Function Tests: No results for input(s): TSH, T4TOTAL, FREET4, T3FREE, THYROIDAB in the last 72 hours. Anemia Panel: No results for input(s): VITAMINB12, FOLATE, FERRITIN, TIBC, IRON, RETICCTPCT in the last 72 hours. Sepsis Labs: No results for input(s): PROCALCITON, LATICACIDVEN in the last 168 hours.  No results found for this or any previous visit (from the past 240 hour(s)).   Radiology Studies: No results found. Scheduled Meds: . feeding supplement (ENSURE ENLIVE)  237 mL Oral BID BM  . feeding supplement (PRO-STAT SUGAR FREE 64)  30 mL Oral Daily  . ferrous gluconate  324 mg Oral BID WC  . metoprolol tartrate  50 mg Oral BID  . multivitamin with minerals  1 tablet Oral Daily  . sodium chloride flush  3 mL Intravenous Q12H   Continuous Infusions: . sodium chloride Stopped (07/17/18 0531)    LOS: 11 days   Kerney Elbe, DO [facility] PAGER is on [place]  If 7PM-7AM, please contact night-coverage [email or web address] Password [facility] 07/17/2018, 5:49 PM

## 2018-07-17 NOTE — Progress Notes (Signed)
Pt. Continues to not utilize CPAP device.  Will be available if patient changes her mind.

## 2018-07-18 DIAGNOSIS — R000 Tachycardia, unspecified: Secondary | ICD-10-CM

## 2018-07-18 LAB — CBC WITH DIFFERENTIAL/PLATELET
ABS IMMATURE GRANULOCYTES: 0.02 10*3/uL (ref 0.00–0.07)
BASOS PCT: 1 %
Basophils Absolute: 0.1 10*3/uL (ref 0.0–0.1)
EOS PCT: 7 %
Eosinophils Absolute: 0.4 10*3/uL (ref 0.0–0.5)
HEMATOCRIT: 36.8 % (ref 36.0–46.0)
Hemoglobin: 10.4 g/dL — ABNORMAL LOW (ref 12.0–15.0)
Immature Granulocytes: 0 %
Lymphocytes Relative: 35 %
Lymphs Abs: 2.2 10*3/uL (ref 0.7–4.0)
MCH: 24.7 pg — AB (ref 26.0–34.0)
MCHC: 28.3 g/dL — ABNORMAL LOW (ref 30.0–36.0)
MCV: 87.4 fL (ref 80.0–100.0)
MONO ABS: 0.6 10*3/uL (ref 0.1–1.0)
MONOS PCT: 10 %
Neutro Abs: 2.9 10*3/uL (ref 1.7–7.7)
Neutrophils Relative %: 47 %
PLATELETS: 382 10*3/uL (ref 150–400)
RBC: 4.21 MIL/uL (ref 3.87–5.11)
RDW: 22.7 % — AB (ref 11.5–15.5)
WBC: 6.2 10*3/uL (ref 4.0–10.5)
nRBC: 0 % (ref 0.0–0.2)

## 2018-07-18 LAB — MAGNESIUM: MAGNESIUM: 1.8 mg/dL (ref 1.7–2.4)

## 2018-07-18 LAB — COMPREHENSIVE METABOLIC PANEL
ALT: 26 U/L (ref 0–44)
AST: 37 U/L (ref 15–41)
Albumin: 2.7 g/dL — ABNORMAL LOW (ref 3.5–5.0)
Alkaline Phosphatase: 78 U/L (ref 38–126)
Anion gap: 6 (ref 5–15)
BILIRUBIN TOTAL: 0.6 mg/dL (ref 0.3–1.2)
BUN: 7 mg/dL (ref 6–20)
CHLORIDE: 109 mmol/L (ref 98–111)
CO2: 23 mmol/L (ref 22–32)
CREATININE: 0.9 mg/dL (ref 0.44–1.00)
Calcium: 8.7 mg/dL — ABNORMAL LOW (ref 8.9–10.3)
Glucose, Bld: 86 mg/dL (ref 70–99)
POTASSIUM: 4.3 mmol/L (ref 3.5–5.1)
Sodium: 138 mmol/L (ref 135–145)
TOTAL PROTEIN: 6.8 g/dL (ref 6.5–8.1)

## 2018-07-18 LAB — PHOSPHORUS: PHOSPHORUS: 3.7 mg/dL (ref 2.5–4.6)

## 2018-07-18 NOTE — Progress Notes (Signed)
PROGRESS NOTE    Dominique [name]  YBW:[id number] DOB: Feb 26, 1981 DOA: 07/04/2018 PCP: Benito Mccreedy, MD  Brief Narrative:  Dominique [name] is an 37 y.o.femalepast medical history significant for morbid obesity obstructive sleep apnea Crohn's, status post perforation with colostomy chronic wounds and abdomen and bilateral groin, who was in his usual health until she developed recurrent nausea and vomiting several days ago. She presented to the ED for lightheaded sensation when she was sitting or standing up. HR noted to be 140s, sinus tachycardia. Now treated for 7 days of Abx for multiple wounds with associated cellulitis and Sepsis. Awaiting SNF placement as she has no bed offers or payor source and cannot go home with Home Health due to extensive wound care that needs to be done. Awaiting Bed Placement  Hospitalization was complicated by sinus tachycardia, hyponatremia, and eye redness and swelling which is now all improved.  Assessment & Plan:   Principal Problem:   Hypovolemia Active Problems:   Orthostatic hypotension   Crohn's colitis with perforation s/p left colectomy/colostomy 2016   OSA on CPAP   Chronic anemia   Multiple wounds of skin   Mild renal insufficiency   Hyponatremia   Malnutrition of moderate degree   Dehydration  Hypovolemia/ Orthostatic hypotension/ Hyponatremia, improved -after aggressive IV hydration, orthostatic hypotension & hyponatremia have resolved- she is eating and drinking appropriately now buthad resumed IVF a few days ago as Cr was trending back up but now IVF will be D/C'd   Anemia due to acute blood loss and Iron deficiency - has bleeding from wounds and her periods - Hb dropped from 9.4 >> 7.9   - Iron level 14, Ferretin was 15 on 9/19 - 10/17>  given 1 U PRBC and a dose of IV Iron - started oral Iron and will continue Ferrous Gluconate 324 mg po BID - Hb/Hct went from 10.1/35.7 -> 9.4/32.6 (Dilutional Drop) -> 10.4/36.8 -Continue to monitor  for signs and symptoms of bleeding -Repeat CBC in a.m.  Metabolic acidosis/ AKI - Suspected prerenal - Treated with IVF and Bicarb tabs and improved  - Cr had improved- Bicarb improved to 23  - Cr went slightly up as BUN/Cr was 7/1.18 but now improved and is 7/0.90 after IVF Hydration -Bicarbonate has stopped -Restarted IVF hydration with NS at 75 mL/hr 07/16/18 and will changed to 50 mL's per hour and now will stop.   Crohn's colitis with perforation s/p left colectomy/colostomy 2016 -Continue to follow stool output to ensure we are matching her output with oral and IV fluids -Resumed IVF hydration 07/16/18 but now stopped  Severe wounds with cellulitis and sepsis -Sepsis Physiology is improved  -Has chronic wounds in abdomen from prior surgery and new wounds due to moisture damage to skin in skin folds/ panus and vaginal area which are full thickness wounds  -Wounds are slightly improving -Added Oxycodone and PRN Dilaudid for pain not tolerable to p.o. medication which appears to be helping -S/p Completion of Bactrim Course -Wound care per nursing protocol  Indwelling urinary catheter, now removed - to prevent soiling of wounds -Discontinued 1023 and patient able to urinate well and is more mobile  Loss of appetite/ mod protein calorie malnutrition - has not been eating much at home and has lost about 30 lbs - Continue supplements with Ensure and live to 37 mils p.o. twice daily between meals and Protostat 30 mL's p.o. daily  OSA -On CPAP; refused CPAP again last night  Super Morbid Obesity -Estimated body mass index  is 65.3 kg/m as calculated from the following:   Height as of this encounter: 5' 2"  (1.575 m).   Weight as of this encounter: 161.9 kg. -Weight loss Counseling given   Severe Deconditioning -Meets criteria to get rehab at SNF which she is in agreement with but is not getting insurance approval -Not appropriate for CIR -PT/OT Recommending SNF initially  but now has improved and recommending Home Health however patient unable to go Home with Home Health given her current wound care status and location.  Patient has no caregivers and has tried to encompass home health in the past and patient's wounds have gotten worse so she requires a skilled nursing facility placement even though she worked well and has improved mobility with PT  Sinus Tachycardia, improving  -In the setting of Severe Deconditioning and Dehydration -Gave IVF Bolus yesterday and another today ; Continue with IVF Hydration as above -Renewed Telemetry -Started low dose Metoprolol 25 mg po BID and increased to 50 mg po BID -Sinus Tachycardia Improved   Right Eye Erythema and Swelling, improving  -? From using a dirty rag to wipe eye -Discussed case with Opthamology Dr. Nancy Fetter who recommends using artificial tears currently for supportive care and removal of the dirty rag -If is not improving ophthalmology consult in few days -Patient thinks eyes slightly improved however still very erythematous  Thrombocytosis; improved -Likely reactive now resolved -Patient to count went from 434 on 07/05/2018 is now 382 -Continue monitor and repeat CBC in a.m.  Bilateral Knee Warmth and Swelling, stable -Right Knee worse than Left -X-Rays were un-revealing  -Continue to Monitor and Up with Therapy   DVT prophylaxis: SCD's Code Status: FULL CODE Family Communication: No family present at bedside  Disposition Plan: SNF when available   Consultants:   Discussed the case with ophthalmology Dr. Nancy Fetter   Procedures: None   Antimicrobials:  Anti-infectives (From admission, onward)   Start     Dose/Rate Route Frequency Ordered Stop   07/09/18 0000  sulfamethoxazole-trimethoprim (BACTRIM) 400-80 MG tablet     1 tablet Oral 2 times daily 07/09/18 1619     07/07/18 1000  vancomycin (VANCOCIN) 1,500 mg in sodium chloride 0.9 % 500 mL IVPB  Status:  Discontinued     1,500 mg 250 mL/hr over  120 Minutes Intravenous Every 24 hours 07/06/18 1014 07/07/18 0938   07/07/18 1000  sulfamethoxazole-trimethoprim (BACTRIM DS,SEPTRA DS) 800-160 MG per tablet 2 tablet  Status:  Discontinued     2 tablet Oral Every 12 hours 07/07/18 0938 07/14/18 0734   07/06/18 1200  cefTRIAXone (ROCEPHIN) 2 g in sodium chloride 0.9 % 100 mL IVPB  Status:  Discontinued     2 g 200 mL/hr over 30 Minutes Intravenous Every 24 hours 07/06/18 0948 07/07/18 0938   07/06/18 1100  doxycycline (VIBRAMYCIN) 100 mg in sodium chloride 0.9 % 250 mL IVPB  Status:  Discontinued     100 mg 125 mL/hr over 120 Minutes Intravenous Every 12 hours 07/06/18 0915 07/06/18 1002   07/06/18 1015  vancomycin (VANCOCIN) 2,500 mg in sodium chloride 0.9 % 500 mL IVPB     2,500 mg 250 mL/hr over 120 Minutes Intravenous  Once 07/06/18 1014 07/07/18 0826     Subjective: Seen and examined at bedside and states she was doing "ok."  Denies any chest pain, lightheadedness or dizziness.  Had some pain when her dressings were changed last night.  No nausea or vomiting.  Happy that her heart rate improved.  No other concerns or complaints at this time  Objective: Vitals:   07/17/18 2157 07/18/18 0500 07/18/18 0607 07/18/18 1429  BP: 108/68  (!) 120/53 128/63  Pulse: 99  90 86  Resp: 18  16 20   Temp: 98.3 F (36.8 C)  98.5 F (36.9 C) 98.9 F (37.2 C)  TempSrc: Oral  Oral Oral  SpO2: 100%  99% 97%  Weight:  (!) 161.9 kg    Height:        Intake/Output Summary (Last 24 hours) at 07/18/2018 1510 Last data filed at 07/18/2018 0600 Gross per 24 hour  Intake 775.16 ml  Output 200 ml  Net 575.16 ml   Filed Weights   07/16/18 0519 07/17/18 0500 07/18/18 0500  Weight: (!) 158.8 kg (!) 159.2 kg (!) 161.9 kg   Examination: Physical Exam:  Constitutional: Well-nourished, well-developed super morbidly obese African-American female who is appearing in no acute distress and resting comfortably in bed Eyes: Right eye not as erythematous or  swollen today Respiratory: Slightly diminished to auscultation bilaterally no appreciable wheezing, rales, rhonchi Cardiovascular: Regular rate and rhythm.  No appreciable murmurs, rubs or gallops Abdomen: Soft, nontender, distended significantly secondary body habitus.  Bowel sounds present.  Has normal wounds and wounds in her pannus.  Has a colostomy bag in place Skin: Has extensive full-thickness wound in the abdomen and groin areas.  Skin is warm and dry.  Wounds have some drainage and have been changed Psychiatric: Pleasant mood and affect.  Intact judgment and insight  Data Reviewed: I have personally reviewed following labs and imaging studies  CBC: Recent Labs  Lab 07/14/18 0536 07/15/18 1038 07/16/18 0534 07/17/18 0534 07/18/18 0503  WBC 6.8 6.3 6.3 5.5 6.2  NEUTROABS  --  3.0 3.3 2.8 2.9  HGB 9.9* 10.3* 10.1* 9.4* 10.4*  HCT 33.5* 34.8* 35.7* 32.6* 36.8  MCV 81.7 82.7 85.0 85.1 87.4  PLT 486* 461* 407* 389 583   Basic Metabolic Panel: Recent Labs  Lab 07/14/18 0536 07/15/18 1038 07/16/18 0534 07/17/18 0534 07/18/18 0503  NA 133* 132* 137 136 138  K 4.6 4.5 4.7 4.0 4.3  CL 103 99 104 106 109  CO2 23 23 24 23 23   GLUCOSE 87 96 85 90 86  BUN 7 8 7 8 7   CREATININE 1.04* 1.13* 1.18* 1.00 0.90  CALCIUM 8.8* 8.9 8.6* 8.5* 8.7*  MG  --  1.9 1.7 1.7 1.8  PHOS  --  3.8 3.9 3.3 3.7   GFR: Estimated Creatinine Clearance: 126.8 mL/min (by C-G formula based on SCr of 0.9 mg/dL). Liver Function Tests: Recent Labs  Lab 07/15/18 1038 07/16/18 0534 07/17/18 0534 07/18/18 0503  AST 31 43* 34 37  ALT 21 23 21 26   ALKPHOS 83 72 74 78  BILITOT 0.9 0.8 0.7 0.6  PROT 7.5 6.9 6.3* 6.8  ALBUMIN 2.8* 2.6* 2.5* 2.7*   No results for input(s): LIPASE, AMYLASE in the last 168 hours. No results for input(s): AMMONIA in the last 168 hours. Coagulation Profile: No results for input(s): INR, PROTIME in the last 168 hours. Cardiac Enzymes: No results for input(s): CKTOTAL,  CKMB, CKMBINDEX, TROPONINI in the last 168 hours. BNP (last 3 results) No results for input(s): PROBNP in the last 8760 hours. HbA1C: No results for input(s): HGBA1C in the last 72 hours. CBG: No results for input(s): GLUCAP in the last 168 hours. Lipid Profile: No results for input(s): CHOL, HDL, LDLCALC, TRIG, CHOLHDL, LDLDIRECT in the last 72 hours. Thyroid Function  Tests: No results for input(s): TSH, T4TOTAL, FREET4, T3FREE, THYROIDAB in the last 72 hours. Anemia Panel: No results for input(s): VITAMINB12, FOLATE, FERRITIN, TIBC, IRON, RETICCTPCT in the last 72 hours. Sepsis Labs: No results for input(s): PROCALCITON, LATICACIDVEN in the last 168 hours.  No results found for this or any previous visit (from the past 240 hour(s)).   Radiology Studies: No results found. Scheduled Meds: . feeding supplement (ENSURE ENLIVE)  237 mL Oral BID BM  . feeding supplement (PRO-STAT SUGAR FREE 64)  30 mL Oral Daily  . ferrous gluconate  324 mg Oral BID WC  . metoprolol tartrate  50 mg Oral BID  . multivitamin with minerals  1 tablet Oral Daily  . sodium chloride flush  3 mL Intravenous Q12H   Continuous Infusions: . sodium chloride 50 mL/hr at 07/18/18 0600    LOS: 12 days   Kerney Elbe, DO [facility] PAGER is on [place]  If 7PM-7AM, please contact night-coverage [email or web address] Password [email or web address] 07/18/2018, 3:10 PM

## 2018-07-18 NOTE — Progress Notes (Signed)
Pt. Continues to not use CPAP at hour of sleep.  Will be available if patient changes her mind.

## 2018-07-19 LAB — MAGNESIUM: MAGNESIUM: 1.6 mg/dL — AB (ref 1.7–2.4)

## 2018-07-19 LAB — CBC WITH DIFFERENTIAL/PLATELET
ABS IMMATURE GRANULOCYTES: 0.04 10*3/uL (ref 0.00–0.07)
BASOS PCT: 1 %
Basophils Absolute: 0.1 10*3/uL (ref 0.0–0.1)
EOS ABS: 0.4 10*3/uL (ref 0.0–0.5)
Eosinophils Relative: 6 %
HCT: 34.1 % — ABNORMAL LOW (ref 36.0–46.0)
Hemoglobin: 10 g/dL — ABNORMAL LOW (ref 12.0–15.0)
Immature Granulocytes: 1 %
Lymphocytes Relative: 29 %
Lymphs Abs: 2 10*3/uL (ref 0.7–4.0)
MCH: 24.7 pg — ABNORMAL LOW (ref 26.0–34.0)
MCHC: 29.3 g/dL — AB (ref 30.0–36.0)
MCV: 84.2 fL (ref 80.0–100.0)
MONO ABS: 0.8 10*3/uL (ref 0.1–1.0)
Monocytes Relative: 11 %
NEUTROS ABS: 3.6 10*3/uL (ref 1.7–7.7)
Neutrophils Relative %: 52 %
PLATELETS: 340 10*3/uL (ref 150–400)
RBC: 4.05 MIL/uL (ref 3.87–5.11)
RDW: 22.5 % — AB (ref 11.5–15.5)
WBC: 6.8 10*3/uL (ref 4.0–10.5)
nRBC: 0 % (ref 0.0–0.2)

## 2018-07-19 LAB — COMPREHENSIVE METABOLIC PANEL
ALT: 23 U/L (ref 0–44)
ANION GAP: 9 (ref 5–15)
AST: 31 U/L (ref 15–41)
Albumin: 2.4 g/dL — ABNORMAL LOW (ref 3.5–5.0)
Alkaline Phosphatase: 73 U/L (ref 38–126)
BUN: 7 mg/dL (ref 6–20)
CHLORIDE: 110 mmol/L (ref 98–111)
CO2: 20 mmol/L — ABNORMAL LOW (ref 22–32)
Calcium: 8.7 mg/dL — ABNORMAL LOW (ref 8.9–10.3)
Creatinine, Ser: 0.81 mg/dL (ref 0.44–1.00)
GFR calc Af Amer: >60 mL/min (ref >60)
Glucose, Bld: 86 mg/dL (ref 70–99)
POTASSIUM: 4.9 mmol/L (ref 3.5–5.1)
Sodium: 139 mmol/L (ref 135–145)
TOTAL PROTEIN: 6.4 g/dL — AB (ref 6.5–8.1)
Total Bilirubin: 0.6 mg/dL (ref 0.3–1.2)

## 2018-07-19 LAB — PHOSPHORUS: PHOSPHORUS: 3.7 mg/dL (ref 2.5–4.6)

## 2018-07-19 MED ORDER — SODIUM CHLORIDE 0.9 % IV SOLN
INTRAVENOUS | Status: DC | PRN
  Administered 2018-07-19: 1000 mL via INTRAVENOUS
  Administered 2018-07-23 – 2018-08-24 (×2): 250 mL via INTRAVENOUS

## 2018-07-19 MED ORDER — MAGNESIUM SULFATE 2 GM/50ML IV SOLN
2.0000 g | Freq: Once | INTRAVENOUS | Status: AC
  Administered 2018-07-19: 2 g via INTRAVENOUS
  Filled 2018-07-19: qty 50

## 2018-07-19 NOTE — Progress Notes (Signed)
Pt. Continues to refuse CPAP device at night.  Will be available if needed or if patient changes her mind.

## 2018-07-19 NOTE — Progress Notes (Signed)
PROGRESS NOTE    [name]  PQD:[id number] DOB: November 20, 1980 DOA: 07/04/2018 PCP: Benito Mccreedy, MD  Brief Narrative:  Dominique [name] is an 37 y.o.femalepast medical history significant for morbid obesity obstructive sleep apnea Crohn's, status post perforation with colostomy chronic wounds and abdomen and bilateral groin, who was in his usual health until she developed recurrent nausea and vomiting several days ago. She presented to the ED for lightheaded sensation when she was sitting or standing up. HR noted to be 140s, sinus tachycardia. Now treated for 7 days of Abx for multiple wounds with associated cellulitis and Sepsis. Awaiting SNF placement as she has no bed offers or payor source and cannot go home with Home Health due to extensive wound care that needs to be done. Awaiting Bed Placement  Hospitalization was complicated by sinus tachycardia, hyponatremia, and eye redness and swelling which is now all improved or improving.   Assessment & Plan:   Principal Problem:   Hypovolemia Active Problems:   Orthostatic hypotension   Crohn's colitis with perforation s/p left colectomy/colostomy 2016   OSA on CPAP   Chronic anemia   Multiple wounds of skin   Mild renal insufficiency   Hyponatremia   Malnutrition of moderate degree   Dehydration  Hypovolemia/ Orthostatic hypotension/ Hyponatremia, improved -after aggressive IV hydration, orthostatic hypotension & hyponatremia have resolved- she is eating and drinking appropriately now buthad resumed IVF a few days ago as Cr was trending back up but now IVF will be D/C'd  -Patient is now KVO'd at 10 mL/hr  Anemia due to acute blood loss and Iron deficiency - has bleeding from wounds and her periods - Hb dropped from 9.4 >> 7.9   - Iron level 14, Ferretin was 15 on 9/19 - 10/17>  given 1 U PRBC and a dose of IV Iron - started oral Iron and will continue Ferrous Gluconate 324 mg po BID - Hb/Hct stable at 10.0/34.1 -Continue to  monitor for signs and symptoms of bleeding -Repeat CBC in a.m.  Metabolic acidosis/ AKI - Suspected prerenal - Treated with IVF and Bicarb tabs and improved  - Cr had improved- Bicarb improved to 23 but now down to 20 today - Cr went slightly up as BUN/Cr was 7/1.18 but now improved and is 7/0.81 after IVF Hydration -Bicarbonate tabs have stopped but may need to reinitiate  -Restarted IVF hydration with NS at 75 mL/hr 07/16/18 and was changed to 50 mL's per hour but now IVF have stopped -Repeat CMP in AM and if Bicarbonate low will start back on Bicarbonate tabs     Crohn's colitis with perforation s/p left colectomy/colostomy 2016 -Continue to follow stool output to ensure we are matching her output with oral and IV fluids -Resumed IVF hydration 07/16/18 but now stopped  Severe wounds with cellulitis and sepsis -Sepsis Physiology is improved  -Has chronic wounds in abdomen from prior surgery and new wounds due to moisture damage to skin in skin folds/ panus and vaginal area which are full thickness wounds  -Wounds are slightly improving -Added Oxycodone and PRN Dilaudid for pain not tolerable to p.o. medication which appears to be helping -S/p Completion of Bactrim Course -Wound care per nursing protocol  Indwelling urinary catheter, now removed - to prevent soiling of wounds -Discontinued 07/15/18 and patient able to urinate well and is more mobile  Loss of appetite/ mod protein calorie malnutrition - has not been eating much at home and has lost about 30 lbs - Continue supplements with Ensure  and live to 37 mils p.o. twice daily between meals and Protostat 30 mL's p.o. daily  OSA -Ordered for CPAP but patient continuously refusing  Super Morbid Obesity -Estimated body mass index is 65.11 kg/m as calculated from the following:   Height as of this encounter: 5' 2"  (1.575 m).   Weight as of this encounter: 161.5 kg. -Weight loss Counseling given   Severe  Deconditioning -Meets criteria to get rehab at SNF which she is in agreement with but is not getting insurance approval -Not appropriate for CIR -PT/OT Recommending SNF initially but now has improved and recommending Home Health however patient unable to go Home with Home Health given her current wound care status and location.  Patient has no caregivers and has tried to encompass home health in the past and patient's wounds have gotten worse so she requires a skilled nursing facility placement even though she worked well and has improved mobility with PT  Sinus Tachycardia, improved -In the setting of Severe Deconditioning and Dehydration -Gave IVF Bolus yesterday and another today ; Continue with IVF Hydration as above -Renewed Telemetry -Started low dose Metoprolol 25 mg po BID and increased to 50 mg po BID and will keep at 50 mg po BID given control and may possibly even increase to 75 mg po BID -Sinus Tachycardia Improved   Right Eye Erythema and Swelling, improving  -? From using a dirty rag to wipe eye -Discussed case with Opthamology Dr. Nancy Fetter who recommends using artificial tears currently for supportive care and removal of the dirty rag -If is not improving ophthalmology consult in few days -Patient thinks eyes slightly improved however still very erythematous  Thrombocytosis; improved -Likely reactive now resolved -Patient to count went from 434 on 07/05/2018 is now 340 -Continue monitor and repeat CBC in a.m.  Bilateral Knee Warmth and Swelling, stable -Right Knee worse than Left -X-Rays were un-revealing  -Continue to Monitor and Up with Therapy   Hypomagnesemia  -Patient magnesium level this morning was 1.6 -Replete with IV mag sulfate 2 g -Continue monitor replete as necessary -Repeat magnesium level in the a.m.  DVT prophylaxis: SCD's Code Status: FULL CODE Family Communication: No family present at bedside  Disposition Plan: SNF when able and bed  offered  Consultants:   Discussed the case with ophthalmology Dr. Nancy Fetter   Procedures: None   Antimicrobials:  Anti-infectives (From admission, onward)   Start     Dose/Rate Route Frequency Ordered Stop   07/09/18 0000  sulfamethoxazole-trimethoprim (BACTRIM) 400-80 MG tablet     1 tablet Oral 2 times daily 07/09/18 1619     07/07/18 1000  vancomycin (VANCOCIN) 1,500 mg in sodium chloride 0.9 % 500 mL IVPB  Status:  Discontinued     1,500 mg 250 mL/hr over 120 Minutes Intravenous Every 24 hours 07/06/18 1014 07/07/18 0938   07/07/18 1000  sulfamethoxazole-trimethoprim (BACTRIM DS,SEPTRA DS) 800-160 MG per tablet 2 tablet  Status:  Discontinued     2 tablet Oral Every 12 hours 07/07/18 0938 07/14/18 0734   07/06/18 1200  cefTRIAXone (ROCEPHIN) 2 g in sodium chloride 0.9 % 100 mL IVPB  Status:  Discontinued     2 g 200 mL/hr over 30 Minutes Intravenous Every 24 hours 07/06/18 0948 07/07/18 0938   07/06/18 1100  doxycycline (VIBRAMYCIN) 100 mg in sodium chloride 0.9 % 250 mL IVPB  Status:  Discontinued     100 mg 125 mL/hr over 120 Minutes Intravenous Every 12 hours 07/06/18 0915 07/06/18 1002  07/06/18 1015  vancomycin (VANCOCIN) 2,500 mg in sodium chloride 0.9 % 500 mL IVPB     2,500 mg 250 mL/hr over 120 Minutes Intravenous  Once 07/06/18 1014 07/07/18 0826     Subjective: Seen and examined at bedside states that she was restless last night and did not sleep very well.  She states she fell asleep around 6 AM and woke up.  No chest pain, lightheadedness or dizziness.  Thinks her eye is improving it is not watery as much.  No other concerns complaint at this time  Objective: Vitals:   07/18/18 1429 07/18/18 2111 07/19/18 0628 07/19/18 0648  BP: 128/63 (!) 111/59 (!) 114/55   Pulse: 86 97 84   Resp: 20 18 18    Temp: 98.9 F (37.2 C) 98.2 F (36.8 C) 97.9 F (36.6 C)   TempSrc: Oral Oral Oral   SpO2: 97% 100% 100%   Weight:    (!) 161.5 kg  Height:        Intake/Output  Summary (Last 24 hours) at 07/19/2018 1212 Last data filed at 07/19/2018 0458 Gross per 24 hour  Intake 486.52 ml  Output 500 ml  Net -13.48 ml   Filed Weights   07/17/18 0500 07/18/18 0500 07/19/18 0648  Weight: (!) 159.2 kg (!) 161.9 kg (!) 161.5 kg   Examination: Physical Exam:  Constitutional: Well-nourished, well-developed super morbidly obese African-American female currently no acute distress appears calm and resting in bed Eyes: Right eye not as erythematous or swollen today but still has some eye redness Respiratory: Diminished to auscultation bilaterally no appreciable wheezing, rales, rhonchi Cardiovascular: Regular rate and rhythm.  No appreciable murmurs, rubs or gallops. Abdomen: Soft, nontender, distended secondary body habitus.  Bowel sounds present.  Has abdominal wounds and wounds in her pannus as well as in the groin.  Has a colostomy bag in place on the right side Skin: Has extensive full-thickness wounds of the abdomen and the groin areas.  Skin is warm and dry.  No appreciable rashes noted Psychiatric: Patient is a pleasant mood affect.  Intact judgment and insight  Data Reviewed: I have personally reviewed following labs and imaging studies  CBC: Recent Labs  Lab 07/15/18 1038 07/16/18 0534 07/17/18 0534 07/18/18 0503 07/19/18 0533  WBC 6.3 6.3 5.5 6.2 6.8  NEUTROABS 3.0 3.3 2.8 2.9 3.6  HGB 10.3* 10.1* 9.4* 10.4* 10.0*  HCT 34.8* 35.7* 32.6* 36.8 34.1*  MCV 82.7 85.0 85.1 87.4 84.2  PLT 461* 407* 389 382 935   Basic Metabolic Panel: Recent Labs  Lab 07/15/18 1038 07/16/18 0534 07/17/18 0534 07/18/18 0503 07/19/18 0533  NA 132* 137 136 138 139  K 4.5 4.7 4.0 4.3 4.9  CL 99 104 106 109 110  CO2 23 24 23 23  20*  GLUCOSE 96 85 90 86 86  BUN 8 7 8 7 7   CREATININE 1.13* 1.18* 1.00 0.90 0.81  CALCIUM 8.9 8.6* 8.5* 8.7* 8.7*  MG 1.9 1.7 1.7 1.8 1.6*  PHOS 3.8 3.9 3.3 3.7 3.7   GFR: Estimated Creatinine Clearance: 140.8 mL/min (by C-G formula  based on SCr of 0.81 mg/dL). Liver Function Tests: Recent Labs  Lab 07/15/18 1038 07/16/18 0534 07/17/18 0534 07/18/18 0503 07/19/18 0533  AST 31 43* 34 37 31  ALT 21 23 21 26 23   ALKPHOS 83 72 74 78 73  BILITOT 0.9 0.8 0.7 0.6 0.6  PROT 7.5 6.9 6.3* 6.8 6.4*  ALBUMIN 2.8* 2.6* 2.5* 2.7* 2.4*   No results for  input(s): LIPASE, AMYLASE in the last 168 hours. No results for input(s): AMMONIA in the last 168 hours. Coagulation Profile: No results for input(s): INR, PROTIME in the last 168 hours. Cardiac Enzymes: No results for input(s): CKTOTAL, CKMB, CKMBINDEX, TROPONINI in the last 168 hours. BNP (last 3 results) No results for input(s): PROBNP in the last 8760 hours. HbA1C: No results for input(s): HGBA1C in the last 72 hours. CBG: No results for input(s): GLUCAP in the last 168 hours. Lipid Profile: No results for input(s): CHOL, HDL, LDLCALC, TRIG, CHOLHDL, LDLDIRECT in the last 72 hours. Thyroid Function Tests: No results for input(s): TSH, T4TOTAL, FREET4, T3FREE, THYROIDAB in the last 72 hours. Anemia Panel: No results for input(s): VITAMINB12, FOLATE, FERRITIN, TIBC, IRON, RETICCTPCT in the last 72 hours. Sepsis Labs: No results for input(s): PROCALCITON, LATICACIDVEN in the last 168 hours.  No results found for this or any previous visit (from the past 240 hour(s)).   Radiology Studies: No results found. Scheduled Meds: . feeding supplement (ENSURE ENLIVE)  237 mL Oral BID BM  . feeding supplement (PRO-STAT SUGAR FREE 64)  30 mL Oral Daily  . ferrous gluconate  324 mg Oral BID WC  . metoprolol tartrate  50 mg Oral BID  . multivitamin with minerals  1 tablet Oral Daily  . sodium chloride flush  3 mL Intravenous Q12H   Continuous Infusions: . sodium chloride 1,000 mL (07/19/18 0849)    LOS: 13 days   Kerney Elbe, DO [facility] PAGER is on [place]  If 7PM-7AM, please contact night-coverage [email or web address] Password [email or web address] 07/19/2018, 12:12 PM

## 2018-07-20 LAB — CBC WITH DIFFERENTIAL/PLATELET
Abs Immature Granulocytes: 0.09 10*3/uL — ABNORMAL HIGH (ref 0.00–0.07)
BASOS ABS: 0.1 10*3/uL (ref 0.0–0.1)
BASOS PCT: 1 %
EOS PCT: 6 %
Eosinophils Absolute: 0.4 10*3/uL (ref 0.0–0.5)
HCT: 35.1 % — ABNORMAL LOW (ref 36.0–46.0)
Hemoglobin: 10.3 g/dL — ABNORMAL LOW (ref 12.0–15.0)
Immature Granulocytes: 1 %
Lymphocytes Relative: 25 %
Lymphs Abs: 1.6 10*3/uL (ref 0.7–4.0)
MCH: 24.7 pg — AB (ref 26.0–34.0)
MCHC: 29.3 g/dL — AB (ref 30.0–36.0)
MCV: 84.2 fL (ref 80.0–100.0)
Monocytes Absolute: 0.6 10*3/uL (ref 0.1–1.0)
Monocytes Relative: 9 %
NEUTROS PCT: 58 %
NRBC: 0 % (ref 0.0–0.2)
Neutro Abs: 3.6 10*3/uL (ref 1.7–7.7)
Platelets: 394 10*3/uL (ref 150–400)
RBC: 4.17 MIL/uL (ref 3.87–5.11)
RDW: 22.5 % — AB (ref 11.5–15.5)
WBC: 6.3 10*3/uL (ref 4.0–10.5)

## 2018-07-20 LAB — MAGNESIUM: Magnesium: 1.8 mg/dL (ref 1.7–2.4)

## 2018-07-20 LAB — COMPREHENSIVE METABOLIC PANEL
ALBUMIN: 2.4 g/dL — AB (ref 3.5–5.0)
ALT: 20 U/L (ref 0–44)
ANION GAP: 6 (ref 5–15)
AST: 27 U/L (ref 15–41)
Alkaline Phosphatase: 69 U/L (ref 38–126)
BUN: 5 mg/dL — ABNORMAL LOW (ref 6–20)
CO2: 23 mmol/L (ref 22–32)
Calcium: 8.9 mg/dL (ref 8.9–10.3)
Chloride: 110 mmol/L (ref 98–111)
Creatinine, Ser: 0.79 mg/dL (ref 0.44–1.00)
GFR calc non Af Amer: >60 mL/min (ref >60)
GLUCOSE: 94 mg/dL (ref 70–99)
POTASSIUM: 4.9 mmol/L (ref 3.5–5.1)
SODIUM: 139 mmol/L (ref 135–145)
TOTAL PROTEIN: 6.1 g/dL — AB (ref 6.5–8.1)
Total Bilirubin: 0.6 mg/dL (ref 0.3–1.2)

## 2018-07-20 LAB — PHOSPHORUS: PHOSPHORUS: 3.9 mg/dL (ref 2.5–4.6)

## 2018-07-20 MED ORDER — TOBRAMYCIN 0.3 % OP SOLN
1.0000 [drp] | OPHTHALMIC | Status: DC
  Administered 2018-07-20 – 2018-07-27 (×20): 1 [drp] via OPHTHALMIC
  Filled 2018-07-20: qty 5

## 2018-07-20 MED ORDER — ENSURE ENLIVE PO LIQD
237.0000 mL | ORAL | Status: DC
  Administered 2018-07-21: 237 mL via ORAL

## 2018-07-20 MED ORDER — PREMIER PROTEIN SHAKE
11.0000 [oz_av] | ORAL | Status: DC
  Filled 2018-07-20 (×10): qty 325.31

## 2018-07-20 NOTE — Progress Notes (Signed)
CSW following to assist with discharge planning to SNF.  Patient currently has no bed offers or payor source for SNF. CSW Surveyor, quantity assisting with SNF placement for patient. CSW  Following up on earlier directive of Social Work Programme researcher, [employer] [facility] [phone]) to inquire to offer patient a bed under a 30 day letter of guarantee. CSW contacted [facility] and left voicemail for admissions staff Orland Jarred) requesting return phone call.  CSW will continue to follow and assist with discharge planning.   Stephanie Acre, [place] Social Worker [phone]

## 2018-07-20 NOTE — Progress Notes (Signed)
Pt continues to decline use of cpap at night.  Pt states she does not want to wear it while here in the hospital.  Cpap rx changed to prn.  Pt was advised that RT is available all night and encouraged her to call, should she change her mind.

## 2018-07-20 NOTE — Progress Notes (Signed)
PROGRESS NOTE    Dominique [name]  JEH:[id number] DOB: 04-Jun-1981 DOA: 07/04/2018 PCP: Benito Mccreedy, MD  Brief Narrative:  Dominique [name] is an 37 y.o.femalepast medical history significant for morbid obesity obstructive sleep apnea Crohn's, status post perforation with colostomy chronic wounds and abdomen and bilateral groin, who was in his usual health until she developed recurrent nausea and vomiting several days ago. She presented to the ED for lightheaded sensation when she was sitting or standing up. HR noted to be 140s, sinus tachycardia. Now treated for 7 days of Abx for multiple wounds with associated cellulitis and Sepsis. Awaiting SNF placement as she has no bed offers or payor source and cannot go home with Home Health due to extensive wound care that needs to be done. Awaiting Bed Placement  Hospitalization was complicated by sinus tachycardia, hyponatremia, and eye redness and swelling which is now all improved or improving.  Patient was moved off her telemetry today and she has been deemed stable for discharge but awaiting placement as mentioned above.  Assessment & Plan:   Principal Problem:   Hypovolemia Active Problems:   Orthostatic hypotension   Crohn's colitis with perforation s/p left colectomy/colostomy 2016   OSA on CPAP   Chronic anemia   Multiple wounds of skin   Mild renal insufficiency   Hyponatremia   Malnutrition of moderate degree   Dehydration  Hypovolemia/ Orthostatic hypotension/ Hyponatremia, improved -after aggressive IV hydration, orthostatic hypotension & hyponatremia have resolved- she is eating and drinking appropriately now buthad resumed IVF a few days ago as Cr was trending back up but now IVF will be D/C'd  -Patient is now KVO'd at 10 mL/hr -Repeat CMP intermittently and will not repeat tomorrow  Anemia due to acute blood loss and Iron deficiency - has bleeding from wounds and her periods - Hb dropped from 9.4 >> 7.9   - Iron level 14,  Ferretin was 15 on 9/19 - 10/17>  given 1 U PRBC and a dose of IV Iron - started oral Iron and will continue Ferrous Gluconate 324 mg po BID - Hb/Hct stable at 10.3/35.1 -Continue to monitor for signs and symptoms of bleeding -Repeat CBC intermittently now  Metabolic acidosis/ AKI, improved - Suspected prerenal - Treated with IVF and Bicarb tabs and improved  - Cr had improved- Bicarb improved to 23 but now down to 20 today - Cr went slightly up as BUN/Cr was 7/1.18 but now improved and is 5/0.79 after IVF Hydration -Bicarbonate tabs have stopped but may need to reinitiate  -Restarted IVF hydration with NS at 75 mL/hr 07/16/18 and was changed to 50 mL's per hour but now IVF have stopped -Repeat CMP in AM and if Bicarbonate low will start back on Bicarbonate tabs however bicarbonate is improved and CO2 is 23 -Continue to monitor CMP intermittently and repeat in a few days    Crohn's colitis with perforation s/p left colectomy/colostomy 2016 -Continue to follow stool output to ensure we are matching her output with oral and IV fluids -Resumed IVF hydration 07/16/18 but now stopped  Severe wounds with cellulitis and sepsis -Sepsis Physiology is improved  -Has chronic wounds in abdomen from prior surgery and new wounds due to moisture damage to skin in skin folds/ panus and vaginal area which are full thickness wounds  -Wounds are slightly improving -Added Oxycodone and PRN Dilaudid for pain not tolerable to p.o. medication which appears to be helping -S/p Completion of Bactrim Course -Wound care per nursing protocol  Indwelling urinary  catheter, now removed - to prevent soiling of wounds -Discontinued 07/15/18 and patient able to urinate well and is more mobile  Loss of appetite/moderate/nonsevere protein calorie malnutrition - has not been eating much at home and has lost about 30 lbs -Nutrition is consulted appreciate and further recommendations - Continue supplements with  Ensure and live to 37 mils p.o. twice daily between meals -Nutrition will be discontinued Protostat -Nutrition also ordering Premier protein once a day along with a Magic cup once a day with meals  OSA -Ordered for CPAP but patient continuously refusing  Super Morbid Obesity -Estimated body mass index is 54.56 kg/m as calculated from the following:   Height as of this encounter: 5' 2"  (1.575 m).   Weight as of this encounter: 135.3 kg. -Weight loss Counseling given   Severe Deconditioning -Meets criteria to get rehab at SNF which she is in agreement with but is not getting insurance approval -Not appropriate for CIR -PT/OT Recommending SNF initially but now has improved and recommending Home Health however patient unable to go Home with Home Health given her current wound care status and location.  Patient has no caregivers and has tried to encompass home health in the past and patient's wounds have gotten worse so she requires a skilled nursing facility placement even though she worked well and has improved mobility with PT  Sinus Tachycardia, improved -In the setting of Severe Deconditioning and Dehydration -Gave IVF Bolus yesterday and another today ; Continue with IVF Hydration as above -Renewed Telemetry -Started low dose Metoprolol 25 mg po BID and increased to 50 mg po BID and will keep at 50 mg po BID given control and may possibly even increase to 75 mg po BID we will hold off for now -Sinus Tachycardia Improved and will remove off telemetry  Right Eye Erythema and Swelling the setting of chronic care stenosis and right eye blurred vision, improving  -? From using a dirty rag to wipe eye -Discussed case with Opthamology Dr. 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  Bilateral Knee Warmth and Swelling, stable -Right Knee worse than Left -X-Rays were un-revealing  -Continue to Monitor and Up with Therapy   Hypomagnesemia  -Patient magnesium level was 1.6 and improved to 1.8 -Replete with IV mag sulfate 2 g yesterday  -Continue monitor replete as necessary -Repeat magnesium level in the a.m.  DVT prophylaxis: SCD's Code Status: FULL CODE Family Communication: No family present at bedside  Disposition Plan: SNF when able and bed offered  Consultants:   Discussed the case with ophthalmology Dr. Nancy Fetter   Procedures: None   Antimicrobials:  Anti-infectives (From admission, onward)   Start     Dose/Rate Route Frequency Ordered Stop   07/09/18 0000  sulfamethoxazole-trimethoprim (BACTRIM) 400-80 MG tablet     1 tablet Oral 2 times daily 07/09/18  1619     07/07/18 1000  vancomycin (VANCOCIN) 1,500 mg in sodium chloride 0.9 % 500 mL IVPB  Status:  Discontinued     1,500 mg 250 mL/hr over 120 Minutes Intravenous Every 24 hours 07/06/18 1014 07/07/18 0938   07/07/18 1000  sulfamethoxazole-trimethoprim (BACTRIM DS,SEPTRA DS) 800-160 MG per tablet 2 tablet  Status:  Discontinued     2 tablet Oral Every 12 hours 07/07/18 0938 07/14/18 0734   07/06/18 1200  cefTRIAXone (ROCEPHIN) 2 g in sodium chloride 0.9 % 100 mL IVPB  Status:  Discontinued     2 g 200 mL/hr over 30 Minutes Intravenous Every 24 hours 07/06/18 0948 07/07/18 0938   07/06/18 1100  doxycycline (VIBRAMYCIN)  100 mg in sodium chloride 0.9 % 250 mL IVPB  Status:  Discontinued     100 mg 125 mL/hr over 120 Minutes Intravenous Every 12 hours 07/06/18 0915 07/06/18 1002   07/06/18 1015  vancomycin (VANCOCIN) 2,500 mg in sodium chloride 0.9 % 500 mL IVPB     2,500 mg 250 mL/hr over 120 Minutes Intravenous  Once 07/06/18 1014 07/07/18 0826     Subjective: Seen and examined at bedside states that she again did not sleep very well last night.  No chest pain, lightheadedness or dizziness.  Thinks I is improving however still very red.  States is a chronic problem has been going on for several years now.  No nausea or vomiting.  Feels okay and awaiting bed placement.  Had some pain when dressing was removed.  No other concerns at present time  Objective: Vitals:   07/19/18 2014 07/19/18 2215 07/20/18 0627 07/20/18 0631  BP: (!) 110/54 (!) 117/56 (!) 108/55   Pulse: 92 97 89   Resp: 20  18   Temp: 97.9 F (36.6 C)  98.2 F (36.8 C)   TempSrc: Oral  Oral   SpO2: 100%  99%   Weight:    135.3 kg  Height:        Intake/Output Summary (Last 24 hours) at 07/20/2018 1151 Last data filed at 07/20/2018 0800 Gross per 24 hour  Intake 315.23 ml  Output 540 ml  Net -224.77 ml   Filed Weights   07/18/18 0500 07/19/18 0648 07/20/18 0631  Weight: (!) 161.9 kg (!) 161.5 kg 135.3 kg   Examination: Physical Exam:  Constitutional: Well-nourished, well-developed super morbidly obese African-American female who is currently in no acute distress resting in bed Eyes: Right eye is erythematous and slightly swollen  with keratosis but this is chronic Respiratory: Diminished to auscultation bilaterally but likely secondary to body habitus.  Patient is not tachypneic wheezing excess muscle breathe and has no wheezing, rales, rhonchi. Cardiovascular: Rate rhythm.  No appreciable murmurs, rubs or gallops Abdomen: Soft, nontender, distended secondary body habitus.  Bowel sounds present.  Has abdominal wounds and  wounds in her pannus.  Has a colostomy bag in place Skin: Extensive full-thickness abdominal wounds as well as in the groin area and folds of the pannus.  Skin is warm and dry.  Wound has some drainage that is slightly purulent and have it to be changed this morning Psychiatric: Normal mood and affect.  Intact judgment insight.  Data Reviewed: I have personally reviewed following labs and imaging studies  CBC: Recent Labs  Lab 07/16/18 0534 07/17/18 0534 07/18/18 0503 07/19/18 0533 07/20/18 0545  WBC 6.3 5.5 6.2 6.8 6.3  NEUTROABS 3.3 2.8 2.9 3.6 3.6  HGB 10.1* 9.4* 10.4* 10.0* 10.3*  HCT 35.7*  32.6* 36.8 34.1* 35.1*  MCV 85.0 85.1 87.4 84.2 84.2  PLT 407* 389 382 340 915   Basic Metabolic Panel: Recent Labs  Lab 07/16/18 0534 07/17/18 0534 07/18/18 0503 07/19/18 0533 07/20/18 0545  NA 137 136 138 139 139  K 4.7 4.0 4.3 4.9 4.9  CL 104 106 109 110 110  CO2 24 23 23  20* 23  GLUCOSE 85 90 86 86 94  BUN 7 8 7 7  5*  CREATININE 1.18* 1.00 0.90 0.81 0.79  CALCIUM 8.6* 8.5* 8.7* 8.7* 8.9  MG 1.7 1.7 1.8 1.6* 1.8  PHOS 3.9 3.3 3.7 3.7 3.9   GFR: Estimated Creatinine Clearance: 126.7 mL/min (by C-G formula based on SCr of 0.79 mg/dL). Liver Function Tests: Recent Labs  Lab 07/16/18 0534 07/17/18 0534 07/18/18 0503 07/19/18 0533 07/20/18 0545  AST 43* 34 37 31 27  ALT 23 21 26 23 20   ALKPHOS 72 74 78 73 69  BILITOT 0.8 0.7 0.6 0.6 0.6  PROT 6.9 6.3* 6.8 6.4* 6.1*  ALBUMIN 2.6* 2.5* 2.7* 2.4* 2.4*   No results for input(s): LIPASE, AMYLASE in the last 168 hours. No results for input(s): AMMONIA in the last 168 hours. Coagulation Profile: No results for input(s): INR, PROTIME in the last 168 hours. Cardiac Enzymes: No results for input(s): CKTOTAL, CKMB, CKMBINDEX, TROPONINI in the last 168 hours. BNP (last 3 results) No results for input(s): PROBNP in the last 8760 hours. HbA1C: No results for input(s): HGBA1C in the last 72 hours. CBG: No results for  input(s): GLUCAP in the last 168 hours. Lipid Profile: No results for input(s): CHOL, HDL, LDLCALC, TRIG, CHOLHDL, LDLDIRECT in the last 72 hours. Thyroid Function Tests: No results for input(s): TSH, T4TOTAL, FREET4, T3FREE, THYROIDAB in the last 72 hours. Anemia Panel: No results for input(s): VITAMINB12, FOLATE, FERRITIN, TIBC, IRON, RETICCTPCT in the last 72 hours. Sepsis Labs: No results for input(s): PROCALCITON, LATICACIDVEN in the last 168 hours.  No results found for this or any previous visit (from the past 240 hour(s)).   Radiology Studies: No results found. Scheduled Meds: . [START ON 07/21/2018] feeding supplement (ENSURE ENLIVE)  237 mL Oral Q24H  . ferrous gluconate  324 mg Oral BID WC  . metoprolol tartrate  50 mg Oral BID  . multivitamin with minerals  1 tablet Oral Daily  . protein supplement shake  11 oz Oral Q24H  . sodium chloride flush  3 mL Intravenous Q12H  . tobramycin  1 drop Right Eye Q4H while awake   Continuous Infusions: . sodium chloride Stopped (07/19/18 1544)    LOS: 14 days   Kerney Elbe, DO [facility] PAGER is on AMION  If 7PM-7AM, please contact night-coverage [email or web address] Password [email or web address] 07/20/2018, 11:51 AM

## 2018-07-20 NOTE — Progress Notes (Signed)
Nutrition Follow-up  DOCUMENTATION CODES:   Non-severe (moderate) malnutrition in context of chronic illness, Morbid obesity  INTERVENTION:  - Will d/c Prostat. - Will decrease Ensure Enlive to once/day, each supplement provides 350 kcal and 20 grams of protein. - Will order Premier Protein once/day, this supplement provides 160 kcal and 30 grams of protein.  - Magic Cup once/day with lunch meals, this supplement provides 290 kcal and 9 grams of protein. - Continue to encourage PO intakes.    NUTRITION DIAGNOSIS:   Moderate Malnutrition related to chronic illness as evidenced by energy intake < or equal to 75% for > or equal to 1 month, percent weight loss. -ongoing  GOAL:   Patient will meet greater than or equal to 90% of their needs -unmet on average  MONITOR:   PO intake, Supplement acceptance, Weight trends, Labs, Skin  ASSESSMENT:   37 y.o. female past medical history significant for obesity, OSA, Crohn's disease s/p perforation with colostomy, chronic wound. She was in her usual health state of health until she developed recurrent nausea and vomiting several days ago, she is not lightheaded. Patient admitted with diagnosis of hypovolemia.   Weight trending down and is now the lowest it has been since admission. Patient was able to eat breakfast this AM and reports consuming 100% of this meal (340 kcal, 22 grams of protein). She does not like Prostat and has been refusing it each time offered. She also does not care for Ensure but has sipped on it a few times since last RD assessment (10/22). Will adjust supplements as outlined above and continue to monitor for additional need for adjustment.   Per Dr. Weston Settle note yesterday afternoon, plan is for "SNF when able and bed offered."   Medications reviewed; 2 g IV Mg sulfate x1 run yesterday, daily multivitamin with minerals. Labs reviewed; BUN: 5 mg/dL.    Diet Order:   Diet Order            Diet - low sodium heart  healthy        Diet Heart Room service appropriate? Yes; Fluid consistency: Thin  Diet effective now              EDUCATION NEEDS:   Not appropriate for education at this time  Skin:  Skin Assessment: Skin Integrity Issues: Skin Integrity Issues:: Other (Comment) Other: per flowsheet, non-pressure wounds to: abdomen, upper and lower abdomen, L breast, bilateral groins, sacrum, and bilateral lower back.  Last BM:  10/28 (450 mL via ileostomy)  Height:   Ht Readings from Last 1 Encounters:  07/04/18 5' 2"  (1.575 m)    Weight:   Wt Readings from Last 1 Encounters:  07/20/18 135.3 kg    Ideal Body Weight:  50 kg  BMI:  Body mass index is 54.56 kg/m.  Estimated Nutritional Needs:   Kcal:  2230-2510 (16-18 kcal/kg)  Protein:  135-145 grams  Fluid:  >/= 2.3 L/day     Jarome Matin, MS, RD, LDN, [facility] Inpatient Clinical Dietitian Pager # [phone] After hours/weekend pager # [phone]

## 2018-07-21 NOTE — Progress Notes (Signed)
Physical Therapy Treatment Patient Details Name: Dominique [name] MRN: [id number] DOB: [birth year] Today's Date: 07/21/2018    History of Present Illness 37 y.o. 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 Dx of hypovolemia, sepsis, cellulitis    PT Comments    Patient progressing and able to walk further this session.  Still slow and pain limited and did not feel well enough to stay up in chair.  Verbalizing this session about her young daughter and her vocation.  Feel she should improve with follow up skilled care to aid in wound healing prior to d/c home.    Follow Up Recommendations  SNF     Equipment Recommendations  Rolling walker with 5" wheels;3in1 (PT)(bariatric, when ready for home)    Recommendations for Other Services       Precautions / Restrictions Precautions Precautions: Fall Precaution Comments: colostomy; monitor HR    Mobility  Bed Mobility   Bed Mobility: Supine to Sit;Sit to Supine     Supine to sit: Supervision;HOB elevated Sit to supine: Min guard   General bed mobility comments: to sit use of railings and increased time, to supine throws L leg into bed from standing and sits then slowly scoots assist for R leg all the way into bed  Transfers Overall transfer level: Needs assistance Equipment used: None Transfers: Sit to/from [facility] Sit to Stand: Supervision         General transfer comment: increased time, no physical assist needed  Ambulation/Gait Ambulation/Gait assistance: Supervision;Min guard Gait Distance (Feet): 200 Feet Assistive device: None Gait Pattern/deviations: Step-through pattern;Decreased stride length     General Gait Details: no device this session, able to ambulate without LOB,  demonstrates wide BOS and increased lateral excursion due to body habitus; assist for safety/balance   Stairs             Wheelchair Mobility    Modified Rankin (Stroke Patients Only)       Balance Overall balance assessment: Needs assistance   Sitting balance-Leahy Scale: Good       Standing balance-Leahy Scale: Good Standing balance comment: standing at sink to wash face, stood in room while NT changed bed linens close S for safety                            Cognition Arousal/Alertness: Awake/alert Behavior During Therapy: WFL for tasks assessed/performed Overall Cognitive Status: Within Functional Limits for tasks assessed                                        Exercises      General Comments General comments (skin integrity, edema, etc.): c/o not feeling well with some weakness, no dizziness      Pertinent Vitals/Pain Pain Assessment: 0-10 Pain Score: 6  Pain Location: peri area  Pain Descriptors / Indicators: Burning Pain Intervention(s): Monitored during session;Patient requesting pain meds-RN notified    Home Living                      Prior Function            PT Goals (current goals can now be found in the  care plan section) Acute Rehab PT Goals Patient Stated Goal: to heal wounds Time For Goal Achievement: 08/04/18 Potential to Achieve Goals: Good Progress towards PT goals: Progressing toward goals    Frequency    Min 2X/week      PT Plan Current plan remains appropriate    Co-evaluation              AM-PAC PT "6 Clicks" Daily Activity  Outcome Measure  Difficulty turning over in bed (including adjusting bedclothes, sheets and blankets)?: A Little Difficulty moving from lying on back to sitting on the side of the bed? : A Little Difficulty sitting down on and standing up from a chair with arms (e.g., wheelchair, bedside commode, etc,.)?: Unable Help needed moving to and from a bed to  chair (including a wheelchair)?: A Little Help needed walking in hospital room?: A Little Help needed climbing 3-5 steps with a railing? : A Lot 6 Click Score: 15    End of Session Equipment Utilized During Treatment: Gait belt Activity Tolerance: Patient tolerated treatment well Patient left: in bed;with call bell/phone within reach   PT Visit Diagnosis: Difficulty in walking, not elsewhere classified (R26.2)     Time: 1438-8875 PT Time Calculation (min) (ACUTE ONLY): 28 min  Charges:  $Gait Training: 8-22 mins $Therapeutic Activity: 8-22 mins                     Magda Kiel, [place] [phone] 07/21/2018    Reginia Naas 07/21/2018, 4:06 PM

## 2018-07-21 NOTE — Consult Note (Signed)
[place] Nurse wound follow up Patient evaluated in [place].  No family present. Wound type: According to the note from L. McNichol on 07/06/18 at 1:09 PM, there is ample reason to question if these wounds are Hidradenitis Suppurativa (HS) given the patient's medical history, and the location and characteristics of the wounds. Wound beds: Basically everywhere the patient has a body fold (inframammary, abdominal pannus, mid-back, bilateral inguinal, and intragluteal area), she has wet, slick, pink tissue of longstanding duration.   Drainage (amount, consistency, odor) Heavy amounts of serosanginous. Periwound: Macerated, fragile Dressing procedure/placement/frequency: Per orders written by L. McNichol on 07/06/18.  Please continue to provide the care to the areas outlined in those orders.  The question has been raised by the Case Manager as to whether or not these wounds will heal, and potentially how long this could take.  The impediments to wound healing that I see are as follows: 1.  There is a possibility these may represent HS.  IF that is the case, the only definitive, gold standard treatment for the resolution of the condition is surgical removal of the affected tissue.  This would likely require a biopsy of the tissues for histologic analysis and referral to a speciality practice. Of note, non-surgically treated HS does not resolve, it is a chronic condition that must be managed without the goal of "healing". 2.  If the areas are determined NOT to be HS, the patient's body habitus is such that keeping the wounds in a balanced moisture state will be extremely difficult to achieve.  The products previously ordered can facilitate the process if used consistently and appropriately, and would require constant assessment and replacement to optimize achieving the desired outcome. Even so, this could take months, if at all. Monitor the wound area(s) for worsening of condition such as: Signs/symptoms of  infection,  Increase in size,  Development of or worsening of odor, Development of pain, or increased pain at the affected locations.  Notify the medical team if any of these develop.  Thank you for the consult.  Discussed plan of care with the patient and bedside nurse.  [place] nurse will not follow at this time.  Please re-consult the [place] team if needed.  Val Riles, RN, MSN, CWOCN, CNS-BC, pager [phone]

## 2018-07-21 NOTE — Progress Notes (Signed)
PROGRESS NOTE    Dominique [name]  ZRA:[id number] DOB: 1981/03/26 DOA: 07/04/2018 PCP: Benito Mccreedy, MD  Brief Narrative:  Dominique [name] is an 37 y.o.femalepast medical history significant for morbid obesity obstructive sleep apnea Crohn's, status post perforation with colostomy chronic wounds and abdomen and bilateral groin, who was in his usual health until she developed recurrent nausea and vomiting several days ago. She presented to the ED for lightheaded sensation when she was sitting or standing up. HR noted to be 140s, sinus tachycardia. Now treated for 7 days of Abx for multiple wounds with associated cellulitis and Sepsis. Awaiting SNF placement as she has no bed offers or payor source and cannot go home with Home Health due to extensive wound care that needs to be done. Awaiting Bed Placement  Hospitalization was complicated by sinus tachycardia, hyponatremia, and eye redness and swelling which is now all improved or improving.  Patient was moved off her telemetry and she has been deemed stable for discharge but awaiting placement as mentioned above.  Assessment & Plan:   Principal Problem:   Hypovolemia Active Problems:   Orthostatic hypotension   Crohn's colitis with perforation s/p left colectomy/colostomy 2016   OSA on CPAP   Chronic anemia   Multiple wounds of skin   Mild renal insufficiency   Hyponatremia   Malnutrition of moderate degree   Dehydration  Hypovolemia/ Orthostatic hypotension/ Hyponatremia, improved -after aggressive IV hydration, orthostatic hypotension & hyponatremia have resolved- she is eating and drinking appropriately now buthad resumed IVF a few days ago as Cr was trending back up but now IVF will be D/C'd  -Patient is now KVO'd at 10 mL/hr -Repeat CMP intermittently and will not repeat tomorrow  Anemia due to acute blood loss and Iron deficiency - has bleeding from wounds and her periods - Hb dropped from 9.4 >> 7.9   - Iron level 14,  Ferretin was 15 on 9/19 - 10/17>  given 1 U PRBC and a dose of IV Iron - started oral Iron and will continue Ferrous Gluconate 324 mg po BID - Hb/Hct stable at 10.3/35.1 on 07/20/18 -Continue to monitor for signs and symptoms of bleeding -Repeat CBC intermittently now  Metabolic acidosis/ AKI, improved - Suspected prerenal - Treated with IVF and Bicarb tabs and improved  - Cr had improved- Bicarb improved to 23 but now down to 20 today - Cr went slightly up as BUN/Cr was 7/1.18 but now improved and is 5/0.79 after IVF Hydration on 07/20/18 -Bicarbonate tabs have stopped but may need to reinitiate  -IVF now stopped  -Repeat CMP intermittently  and if Bicarbonate low will start back on Bicarbonate tabs however bicarbonate is improved and CO2 is 23 -Continue to monitor CMP intermittently and repeat in a few days    Crohn's colitis with perforation s/p left colectomy/colostomy 2016 -Continue to follow stool output to ensure we are matching her output with oral and IV fluids -Resumed IVF hydration 07/16/18 but now stopped  Severe wounds with cellulitis and sepsis; ? Hidradenitis Suppurativa -Sepsis Physiology is improved  -Has chronic wounds in abdomen from prior surgery and new wounds due to moisture damage to skin in skin folds/ panus and vaginal area which are full thickness wounds  -Wounds are slightly improving -Added Oxycodone and PRN Dilaudid for pain not tolerable to p.o. medication which appears to be helping -S/p Completion of Bactrim Course -Wound care per nursing protocol; [facility] nurse reconsulted today and raising the ? Of Hidradenitis Suppurativa and states the only  definitive treatment for this would be surgical removal and she needs a biopsy of the tissues for histologic analysis and referral to a specialty practice but if it is not determined to be HS then keepking the patient in a balanced moisture state will be very difficult to achieve and may take months   Indwelling  urinary catheter, now removed - to prevent soiling of wounds -Discontinued 07/15/18 and patient able to urinate well and is more mobile  Loss of appetite/moderate/nonsevere protein calorie malnutrition - has not been eating much at home and has lost about 30 lbs -Nutrition is consulted appreciate and further recommendations - Continue supplements with Ensure and live to 37 mils p.o. twice daily between meals -Nutrition will be discontinued Protostat -Nutrition also ordering Premier protein once a day along with a Magic cup once a day with meals  OSA -Ordered for CPAP but patient continuously refusing so order was changed to PRN  Super Morbid Obesity -Estimated body mass index is 54.74 kg/m as calculated from the following:   Height as of this encounter: 5' 2"  (1.575 m).   Weight as of this encounter: 135.8 kg. -Weight loss Counseling given   Severe Deconditioning -Meets criteria to get rehab at SNF which she is in agreement with but is not getting insurance approval -Not appropriate for CIR -PT/OT Recommending SNF initially but now has improved and recommending Home Health however patient unable to go Home with Home Health given her current wound care status and location.  Patient has no caregivers and has tried to [facility] home health in the past and patient's wounds have gotten worse so she requires a skilled nursing facility placement even though she worked well and has improved mobility with PT  Sinus Tachycardia, improved -In the setting of Severe Deconditioning and Dehydration -Gave IVF Bolus yesterday and another today ; Continue with IVF Hydration as above -Renewed Telemetry -Started low dose Metoprolol 25 mg po BID and increased to 50 mg po BID and will keep at 50 mg po BID given control and may possibly even increase to 75 mg po BID we will hold off for now increasing for now  -Sinus Tachycardia Improved and will remove off telemetry  Right Eye Erythema and Swelling  the setting of chronic Keratitis and right eye blurred vision, -Worsened and ? From using a dirty rag to wipe eye -Discussed case with Opthamology Dr. 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  Bilateral Knee Warmth and Swelling, stable -Right Knee worse than Left -X-Rays were un-revealing  -Continue to Monitor and Up with Therapy   Hypomagnesemia  -Patient magnesium level was 1.6 and improved to 1.8 on 07/20/18 -Continue monitor replete as necessary -Repeat magnesium level in the a.m.  DVT  prophylaxis: SCD's Code Status: FULL CODE Family Communication: No family present at bedside  Disposition Plan: SNF when able and bed offered  Consultants:   Discussed the case with ophthalmology Dr. Nancy Fetter   Procedures: None   Antimicrobials:  Anti-infectives (From admission, onward)   Start     Dose/Rate Route Frequency Ordered Stop   07/09/18 0000  sulfamethoxazole-trimethoprim (BACTRIM) 400-80 MG tablet     1 tablet Oral 2 times daily 07/09/18 1619     07/07/18 1000  vancomycin (VANCOCIN) 1,500 mg in sodium chloride 0.9 % 500 mL IVPB  Status:  Discontinued     1,500 mg 250 mL/hr over  120 Minutes Intravenous Every 24 hours 07/06/18 1014 07/07/18 0938   07/07/18 1000  sulfamethoxazole-trimethoprim (BACTRIM DS,SEPTRA DS) 800-160 MG per tablet 2 tablet  Status:  Discontinued     2 tablet Oral Every 12 hours 07/07/18 0938 07/14/18 0734   07/06/18 1200  cefTRIAXone (ROCEPHIN) 2 g in sodium chloride 0.9 % 100 mL IVPB  Status:  Discontinued     2 g 200 mL/hr over 30 Minutes Intravenous Every 24 hours 07/06/18 0948 07/07/18 0938   07/06/18 1100  doxycycline (VIBRAMYCIN) 100 mg in sodium chloride 0.9 % 250 mL IVPB  Status:  Discontinued     100 mg 125 mL/hr over 120 Minutes Intravenous Every 12 hours 07/06/18 0915 07/06/18 1002   07/06/18 1015  vancomycin (VANCOCIN) 2,500 mg in sodium chloride 0.9 % 500 mL IVPB     2,500 mg 250 mL/hr over 120 Minutes Intravenous  Once 07/06/18 1014 07/07/18 0826     Subjective: Seen and examined at bedside states he felt okay.  Denying chest pain, lightheadedness or dizziness.  Much today.  I have asked more erythematous but patient thinks it is getting better.  No other concerns or complaints at this time still awaiting bed placement for patient.  Objective: Vitals:   07/20/18 1259 07/20/18 2035 07/21/18 0434 07/21/18 0453  BP: 117/64 (!) 111/51 (!) 118/52   Pulse: 80 87 80   Resp: 20 18 18    Temp: 97.6 F (36.4 C) 98.5 F (36.9 C) 97.7 F (36.5 C)   TempSrc: Oral Oral Oral   SpO2: 99% 100% 100%   Weight:    135.8 kg  Height:        Intake/Output Summary (Last 24 hours) at 07/21/2018 1146 Last data filed at 07/21/2018 0600 Gross per 24 hour  Intake 93 ml  Output 126 ml  Net -33 ml   Filed Weights   07/19/18 0648 07/20/18 0631 07/21/18 0453  Weight: (!) 161.5 kg 135.3 kg 135.8 kg   Examination: Physical Exam:  Constitutional: Well-nourished, well-developed super morbidly obese African-American female is currently no acute distress laying in bed and appears calm Eyes: Right eye is appearing more erythematous and still  slightly swollen the patient feels it is getting better.  Respiratory: Diminished to auscultation bilaterally but this is likely secondary to her body habitus.  Patient not tachypneic or using any accessory muscle breathe and has no appreciable wheezing, rales, rhonchi. Cardiovascular: Regular rate and rhythm.  No appreciable murmurs or rubs, gallops Abdomen: Soft, nontender, distended secondary body habitus.  Bowel sounds present.  Has abdominal wounds wounds were panties and has a colostomy bag in place Skin: Skin has extensive full-thickness abdominal wounds in the groin area in the folds of pannus.  Is warm and dry.  Has midline abdominal wounds as well.  Wounds have as much drainage as it was yesterday.  Has a colostomy bag present Psychiatric: Normal mood and affect.  Intact judgment and insight  Data Reviewed: I have personally reviewed following labs and imaging studies  CBC: Recent Labs  Lab 07/16/18 0534 07/17/18 0534 07/18/18 0503 07/19/18 0533 07/20/18 0545  WBC 6.3 5.5 6.2 6.8 6.3  NEUTROABS 3.3 2.8 2.9 3.6 3.6  HGB 10.1* 9.4* 10.4* 10.0* 10.3*  HCT 35.7* 32.6* 36.8 34.1* 35.1*  MCV 85.0 85.1 87.4 84.2 84.2  PLT 407* 389 382 340 599   Basic Metabolic Panel: Recent Labs  Lab 07/16/18 0534 07/17/18 0534 07/18/18 0503 07/19/18 0533 07/20/18 0545  NA 137 136 138 139 139  K 4.7 4.0 4.3 4.9 4.9  CL 104 106 109 110 110  CO2 24 23 23  20* 23  GLUCOSE 85 90 86 86 94  BUN 7 8 7 7  5*  CREATININE 1.18* 1.00 0.90 0.81 0.79  CALCIUM 8.6* 8.5* 8.7* 8.7* 8.9  MG 1.7 1.7 1.8 1.6* 1.8  PHOS 3.9 3.3 3.7 3.7 3.9   GFR: Estimated Creatinine Clearance: 127 mL/min (by C-G formula based on SCr of 0.79 mg/dL). Liver Function Tests: Recent Labs  Lab 07/16/18 0534 07/17/18 0534 07/18/18 0503 07/19/18 0533 07/20/18 0545  AST 43* 34 37 31 27  ALT 23 21 26 23 20   ALKPHOS 72 74 78 73 69  BILITOT 0.8 0.7 0.6 0.6 0.6  PROT 6.9 6.3* 6.8 6.4* 6.1*  ALBUMIN 2.6* 2.5* 2.7* 2.4* 2.4*     No results for input(s): LIPASE, AMYLASE in the last 168 hours. No results for input(s): AMMONIA in the last 168 hours. Coagulation Profile: No results for input(s): INR, PROTIME in the last 168 hours. Cardiac Enzymes: No results for input(s): CKTOTAL, CKMB, CKMBINDEX, TROPONINI in the last 168 hours. BNP (last 3 results) No results for input(s): PROBNP in the last 8760 hours. HbA1C: No results for input(s): HGBA1C in the last 72 hours. CBG: No results for input(s): GLUCAP in the last 168 hours. Lipid Profile: No results for input(s): CHOL, HDL, LDLCALC, TRIG, CHOLHDL, LDLDIRECT in the last 72 hours. Thyroid Function Tests: No results for input(s): TSH, T4TOTAL, FREET4, T3FREE, THYROIDAB in the last 72 hours. Anemia Panel: No results for input(s): VITAMINB12, FOLATE, FERRITIN, TIBC, IRON, RETICCTPCT in the last 72 hours. Sepsis Labs: No results for input(s): PROCALCITON, LATICACIDVEN in the last 168 hours.  No results found for this or any previous visit (from the past 240 hour(s)).   Radiology Studies: No results found. Scheduled Meds: . feeding supplement (ENSURE ENLIVE)  237 mL Oral Q24H  . ferrous gluconate  324 mg Oral BID WC  . metoprolol tartrate  50 mg Oral BID  . multivitamin with minerals  1 tablet Oral Daily  . protein supplement shake  11 oz Oral Q24H  . sodium chloride flush  3 mL Intravenous Q12H  . tobramycin  1 drop Right Eye Q4H while awake   Continuous Infusions: . sodium chloride Stopped (07/19/18 1544)    LOS: 15 days   Kerney Elbe, DO [facility] PAGER is on AMION  If 7PM-7AM, please contact night-coverage [email or web address] Password [email or web address] 07/21/2018, 11:46 AM

## 2018-07-21 NOTE — Progress Notes (Signed)
Attempted to see pt. Pt with nursing getting medications and having wounds dressed.  Will attempt to return as schedule allows. Jinger [place], [place] [phone]

## 2018-07-22 NOTE — Progress Notes (Signed)
PROGRESS NOTE    Dominique [name]  MIW:[id number] DOB: [birth year] DOA: 07/04/2018 PCP: Benito Mccreedy, MD    Brief Narrative:  Dominique Mooreis an 37 y.o.femalepast medical history significant for morbid obesity obstructive sleep apnea Crohn's, status post perforation with colostomy chronic wounds and abdomen and bilateral groin, who was in his usual health until she developed recurrent nausea and vomiting several days ago. She presented to the ED for lightheaded sensation when she was sitting or standing up. HR noted to be 140s, sinus tachycardia. Now treated for 7 days of Abx for multiple wounds with associated cellulitis and Sepsis. Awaiting SNF placement as she has no bed offers or payor source and cannot go home with Home Health due to extensive wound care that needs to be done. Awaiting Bed Placement  Hospitalization was complicated by sinus tachycardia, hyponatremia, and eye redness and swelling which is now all improved or improving.  Patient was moved off her telemetry today and she has been deemed stable for discharge but awaiting placement as mentioned above.   Assessment & Plan:   Principal Problem:   Hypovolemia Active Problems:   Orthostatic hypotension   Crohn's colitis with perforation s/p left colectomy/colostomy 2016   OSA on CPAP   Chronic anemia   Multiple wounds of skin   Mild renal insufficiency   Hyponatremia   Malnutrition of moderate degree   Dehydration  Hypovolemia/ Orthostatic hypotension/ Hyponatremia, improved -after aggressive IV hydration, orthostatic hypotension & hyponatremia have resolved- she is tolerating oral intake.  IV fluids had to be resumed due to a increasing creatinine which has subsequently improved.  IV fluids currently discontinued.  Follow.   Anemia due to acute blood loss and Iron deficiency - has bleeding from wounds and her periods - Hb dropped from 9.4 >>7.9  - Iron level 14, Ferretin was 15 on 9/19 - 10/17>given 1 U PRBC  and a dose of IV Iron - started oral Iron and will continue Ferrous Gluconate 324 mg po BID - Hb/Hct stable at 10.3/35.1 -Continue to monitor for signs and symptoms of bleeding -Repeat CBC intermittently now  Metabolic acidosis/ AKI, improved -Secondary to prerenal azotemia.  - Treated with IVF and Bicarb tabs and improved  - Cr had improved- Bicarb improved to 23. - Cr went slightly up as BUN/Cr was 7/1.18 but now improved and is 5/0.79 after IVF Hydration -Bicarbonate tabs have stopped but may need to reinitiate  -Restarted IVF hydration with NS at 75 mL/hr 07/16/18 and was changed to 50 mL's per hour but now IVF have stopped -Repeat CMP in AM and if Bicarbonate low will start back on Bicarbonate tabs however bicarbonate is improved and CO2 is 23 -Continue to monitor CMP intermittently and repeat in a few days   Crohn's colitis with perforation s/p left colectomy/colostomy 2016 -Continue to follow stool output to ensure we are matching her output with oral and IV fluids -Hydration improved.  IV fluids have been discontinued.   Severe wounds with cellulitis and sepsis -Sepsis Physiology is improved  -Has chronic wounds in abdomen from prior surgery and new wounds due to moisture damage to skin in skin folds/ panus and vaginal area which are full thickness wounds  -Wounds are slightly improving -Added Oxycodone and PRN Dilaudid for pain not tolerable to p.o. medication which appears to be helping -S/p Completion of Bactrim Course -Wound care per nursing protocol -Inter-dry.  Indwelling urinary catheter, now removed - to prevent soiling of wounds -Discontinued 07/15/18 and patient able to urinate well  and is more mobile  Loss of appetite/moderate/nonsevere protein calorie malnutrition - has not been eating much at home and has lost about 30 lbs -Nutrition is consulted appreciate and further recommendations - Continue supplements with Ensure and live to 37 mils p.o. twice daily  between meals -Nutrition will be discontinued Protostat -Nutrition also ordering Premier protein once a day along with a Magic cup once a day with meals  OSA -Ordered for CPAP but patient continuously refusing  Super Morbid Obesity -Estimated body mass index is 54.56 kg/m as calculated from the following:   Height as of this encounter: 5' 2"  (1.575 m).   Weight as of this encounter: 135.3 kg. -Weight loss Counseling given   Severe Deconditioning -Meets criteria to get rehab at SNF which she is in agreement with but is awaiting insurance approval -Not appropriate for CIR -PT/OT Recommended SNF initially but now has improved and recommending Home Health however patient unable to go Home with Home Health given her current wound care status and location.  Patient has no caregivers and has tried to encompass home health in the past and patient's wounds have gotten worse so she requires a skilled nursing facility placement even though she worked well and has improved mobility with PT  Sinus Tachycardia, improved -In the setting of Severe Deconditioning and Dehydration -Improved with fluid bolus hydration and IV fluids and beta-blocker..  Telemetry has been discontinued.  -Started low dose Metoprolol 25 mg po BID and increased to 50 mg po BID and will keep at 50 mg po BID given control and may possibly even increase to 75 mg po BID we will hold off for now -Sinus Tachycardia Improved.  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 From using a dirty rag to wipe eye -Dr. Alfredia Ferguson discussed case with Opthamology Dr. Nancy Fetter who recommended using artificial tears currently for supportive care and removal of the dirty rag -If is not improving ophthalmology consult in few days however patient is seen ophthalmology in the past and this is not a new issue -Patient thinks eyes slightly improved however still very  erythematous -Tobramycin eyedrops have been started we will continue for now.   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  Bilateral Knee Warmth and Swelling, stable -Right Knee worse than Left.  Likely secondary to osteoarthritis. -X-Rays were un-revealing and consistent with tricompartmental osteoarthritis. -Continue to Monitor and Up with Therapy   Hypomagnesemia  -Patient magnesium level was 1.6 and improved to 1.8 -Repleted with IV magnesium.  Follow.     DVT prophylaxis: Patient refusing Lovenox. Code Status: Full Family Communication: Updated patient.  No family at bedside. Disposition Plan: Skilled nursing facility when bed available.   Consultants:   Wound care: Grant Ruts, RN 07/06/2018  Procedures:   Plain films of the left knee 07/15/2018  Antimicrobials:   Bactrim 07/07/2018>>>>> 07/14/2018     Subjective: Patient laying in bed.  No chest pain.  No shortness of breath.  Patient denies any nausea or vomiting.  No dizziness.  No lightheadedness.  Objective: Vitals:   07/21/18 0453 07/21/18  1300 07/21/18 2219 07/22/18 0549  BP:  (!) 115/58 (!) 111/53 (!) 104/52  Pulse:  79 93 82  Resp:  18 14 14   Temp:  98.1 F (36.7 C) 98.2 F (36.8 C) 98.3 F (36.8 C)  TempSrc:  Oral Oral Oral  SpO2:  100% 100% 99%  Weight: 135.8 kg   134.4 kg  Height:        Intake/Output Summary (Last 24 hours) at 07/22/2018 1039 Last data filed at 07/22/2018 0600 Gross per 24 hour  Intake 3 ml  Output 900 ml  Net -897 ml   Filed Weights   07/20/18 0631 07/21/18 0453 07/22/18 0549  Weight: 135.3  kg 135.8 kg 134.4 kg    Examination:  General exam: Appears calm and comfortable  Respiratory system: Clear to auscultation anterior lung fields.  No wheezes noted.  No rhonchi.  No rales.  Respiratory effort normal. Cardiovascular system: S1 & S2 heard, RRR. No JVD, murmurs, rubs, gallops or clicks. No pedal edema. Gastrointestinal system: Abdomen is nondistended, soft and nontender. No organomegaly or masses felt. Normal bowel sounds heard.  Abdominal wounds and wounds in the pannus.  Colostomy bag in place. Central nervous system: Alert and oriented. No focal neurological deficits. Extremities: Symmetric 5 x 5 power. Skin: Extensive full-thickness abdominal wounds as well as in the groin area and folds of the pannus.  Slight drainage.  Psychiatry: Judgement and insight appear normal. Mood & affect appropriate.     Data Reviewed: I have personally reviewed following labs and imaging studies  CBC: Recent Labs  Lab 07/16/18 0534 07/17/18 0534 07/18/18 0503 07/19/18 0533 07/20/18 0545  WBC 6.3 5.5 6.2 6.8 6.3  NEUTROABS 3.3 2.8 2.9 3.6 3.6  HGB 10.1* 9.4* 10.4* 10.0* 10.3*  HCT 35.7* 32.6* 36.8 34.1* 35.1*  MCV 85.0 85.1 87.4 84.2 84.2  PLT 407* 389 382 340 735   Basic Metabolic Panel: Recent Labs  Lab 07/16/18 0534 07/17/18 0534 07/18/18 0503 07/19/18 0533 07/20/18 0545  NA 137 136 138 139 139  K 4.7 4.0 4.3 4.9 4.9  CL 104 106 109 110 110  CO2 24 23 23  20* 23  GLUCOSE 85 90 86 86 94  BUN 7 8 7 7  5*  CREATININE 1.18* 1.00 0.90 0.81 0.79  CALCIUM 8.6* 8.5* 8.7* 8.7* 8.9  MG 1.7 1.7 1.8 1.6* 1.8  PHOS 3.9 3.3 3.7 3.7 3.9   GFR: Estimated Creatinine Clearance: 126.1 mL/min (by C-G formula based on SCr of 0.79 mg/dL). Liver Function Tests: Recent Labs  Lab 07/16/18 0534 07/17/18 0534 07/18/18 0503 07/19/18 0533 07/20/18 0545  AST 43* 34 37 31 27  ALT 23 21 26 23 20   ALKPHOS 72 74 78 73 69  BILITOT 0.8 0.7 0.6 0.6 0.6  PROT 6.9 6.3* 6.8 6.4* 6.1*  ALBUMIN  2.6* 2.5* 2.7* 2.4* 2.4*   No results for input(s): LIPASE, AMYLASE in the last 168 hours. No results for input(s): AMMONIA in the last 168 hours. Coagulation Profile: No results for input(s): INR, PROTIME in the last 168 hours. Cardiac Enzymes: No results for input(s): CKTOTAL, CKMB, CKMBINDEX, TROPONINI in the last 168 hours. BNP (last 3 results) No results for input(s): PROBNP in the last 8760 hours. HbA1C: No results for input(s): HGBA1C in the last 72 hours. CBG: No results for input(s): GLUCAP in the last 168 hours. Lipid Profile: No results for input(s): CHOL, HDL, LDLCALC, TRIG, CHOLHDL, LDLDIRECT in the last 72 hours. Thyroid Function Tests: No results for input(s): TSH,  T4TOTAL, FREET4, T3FREE, THYROIDAB in the last 72 hours. Anemia Panel: No results for input(s): VITAMINB12, FOLATE, FERRITIN, TIBC, IRON, RETICCTPCT in the last 72 hours. Sepsis Labs: No results for input(s): PROCALCITON, LATICACIDVEN in the last 168 hours.  No results found for this or any previous visit (from the past 240 hour(s)).       Radiology Studies: No results found.      Scheduled Meds: . feeding supplement (ENSURE ENLIVE)  237 mL Oral Q24H  . ferrous gluconate  324 mg Oral BID WC  . metoprolol tartrate  50 mg Oral BID  . multivitamin with minerals  1 tablet Oral Daily  . protein supplement shake  11 oz Oral Q24H  . sodium chloride flush  3 mL Intravenous Q12H  . tobramycin  1 drop Right Eye Q4H while awake   Continuous Infusions: . sodium chloride Stopped (07/19/18 1544)     LOS: 16 days    Time spent: 35 minutes    Irine Seal, MD [facility] Pager [phone]  If 7PM-7AM, please contact night-coverage [email or web address] Password [email or web address] 07/22/2018, 10:39 AM

## 2018-07-22 NOTE — Progress Notes (Signed)
OT Cancellation Note  Patient Details Name: Dominique [name] MRN: [id number] DOB: [birth year]   Cancelled Treatment:    Reason Eval/Treat Not Completed: Other (comment) Pt soundly asleep upon OT arrival, not opening eyes or awaking to cues. Will continue to follow pt next date as available and appropriate to continue OT POC.  Zenovia Jarred, MSOT, OTR/L Behavioral Health OT/ Acute Relief OT [facility] Office: [phone]  Zenovia Jarred 07/22/2018, 5:21 PM

## 2018-07-23 LAB — BASIC METABOLIC PANEL
ANION GAP: 8 (ref 5–15)
BUN: 9 mg/dL (ref 6–20)
CALCIUM: 8.8 mg/dL — AB (ref 8.9–10.3)
CHLORIDE: 107 mmol/L (ref 98–111)
CO2: 24 mmol/L (ref 22–32)
CREATININE: 0.77 mg/dL (ref 0.44–1.00)
GFR calc Af Amer: >60 mL/min (ref >60)
GLUCOSE: 85 mg/dL (ref 70–99)
POTASSIUM: 4.8 mmol/L (ref 3.5–5.1)
Sodium: 139 mmol/L (ref 135–145)

## 2018-07-23 LAB — HEMOGLOBIN AND HEMATOCRIT, BLOOD
HEMATOCRIT: 34.4 % — AB (ref 36.0–46.0)
Hemoglobin: 10 g/dL — ABNORMAL LOW (ref 12.0–15.0)

## 2018-07-23 LAB — MAGNESIUM: Magnesium: 1.6 mg/dL — ABNORMAL LOW (ref 1.7–2.4)

## 2018-07-23 MED ORDER — MAGNESIUM SULFATE 4 GM/100ML IV SOLN
4.0000 g | Freq: Once | INTRAVENOUS | Status: AC
  Administered 2018-07-23: 4 g via INTRAVENOUS
  Filled 2018-07-23: qty 100

## 2018-07-23 NOTE — Progress Notes (Addendum)
CSW received a call from [place] at [facility]. Jenelle requested documents to be faxed to [phone]: FL2, H&P, and therapy notes.  CSW will fax documents.  CSW continuing to follow for discharge needs.   Armandina Gemma Years to plan onsite visit to interview patient.   Stephanie Acre, [place] Social Worker [phone]

## 2018-07-23 NOTE — Progress Notes (Signed)
PROGRESS NOTE    Dominique [name]  [id number]:[id number] DOB: July 01, 1981 DOA: 07/04/2018 PCP: Dominique Mccreedy, MD    Brief Narrative:  Dominique [name] an 37 y.o.femalepast medical history significant for morbid obesity obstructive sleep apnea Crohn's, status post perforation with colostomy chronic wounds and abdomen and bilateral groin, who was in his usual health until she developed recurrent nausea and vomiting several days ago. She presented to the ED for lightheaded sensation when she was sitting or standing up. HR noted to be 140s, sinus tachycardia. Now treated for 7 days of Abx for multiple wounds with associated cellulitis and Sepsis. Awaiting SNF placement as she has no bed offers or payor source and cannot go home with Home Health due to extensive wound care that needs to be done. Awaiting Bed Placement  Hospitalization was complicated by sinus tachycardia, hyponatremia, and eye redness and swelling which is now all improved or improving.  Patient was moved off her telemetry today and she has been deemed stable for discharge but awaiting placement as mentioned above.   Assessment & Plan:   Principal Problem:   Hypovolemia Active Problems:   Orthostatic hypotension   Crohn's colitis with perforation s/p left colectomy/colostomy 2016   OSA on CPAP   Chronic anemia   Multiple wounds of skin   Mild renal insufficiency   Hyponatremia   Malnutrition of moderate degree   Dehydration  Hypovolemia/ Orthostatic hypotension/ Hyponatremia, improved -after aggressive IV hydration, orthostatic hypotension & hyponatremia have resolved- she is tolerating oral intake.  IV fluids had to be resumed due to a increasing creatinine which has subsequently improved.  IV fluids currently discontinued.  Follow.   Anemia due to acute blood loss and Iron deficiency - has bleeding from wounds and her periods - Hb dropped from 9.4 >>7.9  - Iron level 14, Ferretin was 15 on 9/19 - 10/17>given 1 U PRBC  and a dose of IV Iron - started oral Iron and will continue Ferrous Gluconate 324 mg po BID - Hb/Hct stable at 10.0 -Continue to monitor for signs and symptoms of bleeding -Repeat CBC intermittently now  Metabolic acidosis/ AKI, improved -Secondary to prerenal azotemia.  - Treated with IVF and Bicarb tabs and improved  - Cr had improved- Bicarb improved to 23. - Cr went slightly up as BUN/Cr was 7/1.18 but now improved and is 5/0.79 after IVF Hydration -Bicarbonate tabs have stopped but may need to reinitiate  -Restarted IVF hydration with NS at 75 mL/hr 07/16/18 and was changed to 50 mL's per hour but now IVF have stopped -Repeat CMP in AM and if Bicarbonate low will start back on Bicarbonate tabs however bicarbonate is improved and CO2 is 23 -Continue to monitor CMP intermittently and repeat in a few days   Crohn's colitis with perforation s/p left colectomy/colostomy 2016 -Continue to follow stool output to ensure we are matching her output with oral and IV fluids -Hydration improved.  IV fluids have been discontinued.   Severe wounds with cellulitis and sepsis -Sepsis Physiology is improved  -Has chronic wounds in abdomen from prior surgery and new wounds due to moisture damage to skin in skin folds/ panus and vaginal area which are full thickness wounds  -Wounds are slightly improving -Added Oxycodone and PRN Dilaudid for pain not tolerable to p.o. medication which appears to be helping -S/p Completion of Bactrim Course -Wound care per nursing protocol -Inter-dry.  Indwelling urinary catheter, now removed - to prevent soiling of wounds -Discontinued 07/15/18 and patient able to urinate well  and is more mobile  Loss of appetite/moderate/nonsevere protein calorie malnutrition - has not been eating much at home and has lost about 30 lbs -Nutrition is consulted appreciate and further recommendations - Continue supplements with Ensure and live to 37 mils p.o. twice daily  between meals -Nutrition will be discontinued Protostat -Nutrition also ordering Premier protein once a day along with a Magic cup once a day with meals  OSA -Ordered for CPAP but patient continuously refusing  Super Morbid Obesity -Estimated body mass index is 54.56 kg/m as calculated from the following:   Height as of this encounter: 5' 2"  (1.575 m).   Weight as of this encounter: 135.3 kg. -Weight loss Counseling given   Severe Deconditioning -Meets criteria to get rehab at SNF which she is in agreement with but is awaiting insurance approval -Not appropriate for CIR -PT/OT Recommended SNF initially but now has improved and recommending Home Health however patient unable to go Home with Home Health given her current wound care status and location.  Patient has no caregivers and has tried to encompass home health in the past and patient's wounds have gotten worse so she requires a skilled nursing facility placement even though she worked well and has improved mobility with PT  Sinus Tachycardia, improved -In the setting of Severe Deconditioning and Dehydration -Improved with fluid bolus hydration and IV fluids and beta-blocker..  Telemetry has been discontinued.  -Started low dose Metoprolol 25 mg po BID and increased to 50 mg po BID and will keep at 50 mg po BID given control and may possibly even increase to 75 mg po BID we will hold off for now -Sinus Tachycardia Improved.  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 From using a dirty rag to wipe eye -Dr. Alfredia [name] discussed case with Opthamology Dr. Nancy [name] who recommended using artificial tears currently for supportive care and removal of the dirty rag -If is not improving ophthalmology consult in few days however patient is seen ophthalmology in the past and this is not a new issue -Patient thinks eyes slightly improved however still very  erythematous -Tobramycin eyedrops have been started we will continue for now.   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  Bilateral Knee Warmth and Swelling, stable -Right Knee worse than Left.  Likely secondary to osteoarthritis. -X-Rays were un-revealing and consistent with tricompartmental osteoarthritis. -Continue to Monitor and Up with Therapy   Hypomagnesemia  -Patient magnesium level was 1.6 and improved to 1.8.  Magnesium bicarb 1.6.  We will give magnesium 4 g IV x1.  Follow.   DVT prophylaxis: Patient refusing Lovenox. Code Status: Full Family Communication: Updated patient.  No family at bedside. Disposition Plan: Skilled nursing facility when bed available.   Consultants:   Wound care: Grant Ruts, RN 07/06/2018  Procedures:   Plain films of the left knee 07/15/2018  Antimicrobials:   Bactrim 07/07/2018>>>>> 07/14/2018     Subjective: Patient sleeping.  Easily arousable.  No chest pain.  No shortness of breath.  No dizziness.  No lightheadedness.    Objective: Vitals:  07/22/18 1414 07/22/18 2052 07/23/18 0613 07/23/18 0627  BP: (!) 108/50 (!) 102/48  125/64  Pulse: 73 90  97  Resp: 18 18  18   Temp: 98.1 F (36.7 C) 98.6 F (37 C)  97.9 F (36.6 C)  TempSrc: Oral Oral  Oral  SpO2: 99% 100%  96%  Weight:   134.2 kg   Height:        Intake/Output Summary (Last 24 hours) at 07/23/2018 1032 Last data filed at 07/23/2018 0600 Gross per 24 hour  Intake 723 ml  Output 200 ml  Net 523 ml   Filed Weights   07/21/18 0453 07/22/18 0549 07/23/18 0613  Weight: 135.8 kg 134.4  kg 134.2 kg    Examination:  General exam: NAD.  Respiratory system: CTAB.  No wheezes, no crackles, no rhonchi.  Normal respiratory effort.   Cardiovascular system: Regular rate rhythm no murmurs rubs or gallops.  No JVD.  No lower extremity edema.  Gastrointestinal system: Abdomen is soft, nontender, nondistended, positive bowel sounds.  Abdominal wounds and wounds in the pannus.  Colostomy bag in place. Central nervous system: Alert and oriented. No focal neurological deficits. Extremities: Symmetric 5 x 5 power. Skin: Extensive full-thickness abdominal wounds as well as in the groin area and folds of the pannus.  Slight drainage.  Psychiatry: Judgement and insight appear normal. Mood & affect appropriate.     Data Reviewed: I have personally reviewed following labs and imaging studies  CBC: Recent Labs  Lab 07/17/18 0534 07/18/18 0503 07/19/18 0533 07/20/18 0545 07/23/18 0555  WBC 5.5 6.2 6.8 6.3  --   NEUTROABS 2.8 2.9 3.6 3.6  --   HGB 9.4* 10.4* 10.0* 10.3* 10.0*  HCT 32.6* 36.8 34.1* 35.1* 34.4*  MCV 85.1 87.4 84.2 84.2  --   PLT 389 382 340 394  --    Basic Metabolic Panel: Recent Labs  Lab 07/17/18 0534 07/18/18 0503 07/19/18 0533 07/20/18 0545 07/23/18 0555  NA 136 138 139 139 139  K 4.0 4.3 4.9 4.9 4.8  CL 106 109 110 110 107  CO2 23 23 20* 23 24  GLUCOSE 90 86 86 94 85  BUN 8 7 7  5* 9  CREATININE 1.00 0.90 0.81 0.79 0.77  CALCIUM 8.5* 8.7* 8.7* 8.9 8.8*  MG 1.7 1.8 1.6* 1.8 1.6*  PHOS 3.3 3.7 3.7 3.9  --    GFR: Estimated Creatinine Clearance: 126 mL/min (by C-G formula based on SCr of 0.77 mg/dL). Liver Function Tests: Recent Labs  Lab 07/17/18 0534 07/18/18 0503 07/19/18 0533 07/20/18 0545  AST 34 37 31 27  ALT 21 26 23 20   ALKPHOS 74 78 73 69  BILITOT 0.7 0.6 0.6 0.6  PROT 6.3* 6.8 6.4* 6.1*  ALBUMIN 2.5* 2.7* 2.4* 2.4*   No results for input(s): LIPASE, AMYLASE in the last 168 hours. No results for input(s): AMMONIA in the last 168  hours. Coagulation Profile: No results for input(s): INR, PROTIME in the last 168 hours. Cardiac Enzymes: No results for input(s): CKTOTAL, CKMB, CKMBINDEX, TROPONINI in the last 168 hours. BNP (last 3 results) No results for input(s): PROBNP in the last 8760 hours. HbA1C: No results for input(s): HGBA1C in the last 72 hours. CBG: No results for input(s): GLUCAP in the last 168 hours. Lipid Profile: No results for input(s): CHOL, HDL, LDLCALC, TRIG, CHOLHDL, LDLDIRECT in the last 72 hours. Thyroid Function Tests: No results for input(s): TSH, T4TOTAL, FREET4, T3FREE, THYROIDAB in the last 72 hours. Anemia Panel:  No results for input(s): VITAMINB12, FOLATE, FERRITIN, TIBC, IRON, RETICCTPCT in the last 72 hours. Sepsis Labs: No results for input(s): PROCALCITON, LATICACIDVEN in the last 168 hours.  No results found for this or any previous visit (from the past 240 hour(s)).       Radiology Studies: No results found.      Scheduled Meds: . feeding supplement (ENSURE ENLIVE)  237 mL Oral Q24H  . ferrous gluconate  324 mg Oral BID WC  . metoprolol tartrate  50 mg Oral BID  . multivitamin with minerals  1 tablet Oral Daily  . protein supplement shake  11 oz Oral Q24H  . sodium chloride flush  3 mL Intravenous Q12H  . tobramycin  1 drop Right Eye Q4H while awake   Continuous Infusions: . sodium chloride 250 mL (07/23/18 0856)  . magnesium sulfate 1 - 4 g bolus IVPB 4 g (07/23/18 0857)     LOS: 17 days    Time spent: 35 minutes    Irine Seal, MD [facility] Pager [phone]  If 7PM-7AM, please contact night-coverage [email or web address] Password [email or web address] 07/23/2018, 10:32 AM

## 2018-07-24 ENCOUNTER — Inpatient Hospital Stay: Payer: Self-pay

## 2018-07-24 LAB — MAGNESIUM: Magnesium: 2.1 mg/dL (ref 1.7–2.4)

## 2018-07-24 NOTE — Progress Notes (Signed)
Attempted to place RUE midline. Able to access Brachial Vein, but not able to pass guide wire beyond shoulder area. All other vessels of upper arm scanned with [place]. No other vessels viewable to stick. Spoke with patient's nurse to speak with primary. The patient has been stuck, at least twice, by two IV team members.

## 2018-07-24 NOTE — Progress Notes (Signed)
Nutrition Follow-up  DOCUMENTATION CODES:   Non-severe (moderate) malnutrition in context of chronic illness, Morbid obesity  INTERVENTION:  - [employer] Cup, [employer], and [employer] Protein each once/day.  - Continue to encourage patient to consume oral nutrition supplements.  - Continue to encourage PO intakes at meal times.    NUTRITION DIAGNOSIS:   Moderate Malnutrition related to chronic illness as evidenced by energy intake < or equal to 75% for > or equal to 1 month, percent weight loss. -ongoing  GOAL:   Patient will meet greater than or equal to 90% of their needs -likely unmet  MONITOR:   PO intake, Supplement acceptance, Weight trends, Labs, Skin  ASSESSMENT:   37 y.o. female past medical history significant for obesity, OSA, Crohn's disease s/p perforation with colostomy, chronic wound. She was in her usual health state of health until she developed recurrent nausea and vomiting several days ago, she is not lightheaded. Patient admitted with diagnosis of hypovolemia.   Weight has fluctuated frequently throughout admission but has been stable over the past 3 days. No intakes documented since RD visit on 10/28. Patient sleeping soundly at the time of RD visit and no family/visitors present. Patient did not awake to name call.   Spoke with RN who reports patient received and ate breakfast, but she did not see how much patient ended up eating of the meal. Patient has been declining nearly all bottles of Ensure and all of Premier Protein. Patient would highly benefit from supplements given likely ongoing poor intakes of meals.   Per Dr. Biagio Borg note yesterday: patient is awaiting SNF bed placement.    Medications reviewed; daily multivitamin with minerals. Labs reviewed.    Diet Order:   Diet Order            Diet - low sodium heart healthy        Diet Heart Room service appropriate? Yes; Fluid consistency: Thin  Diet effective now               EDUCATION NEEDS:   Not appropriate for education at this time  Skin:  Skin Assessment: Skin Integrity Issues: Skin Integrity Issues:: Other (Comment) Other: per flowsheet, non-pressure wounds to: abdomen, upper and lower abdomen, L breast, bilateral groins, sacrum, and bilateral lower back.  Last BM:  10/31  Height:   Ht Readings from Last 1 Encounters:  07/04/18 5' 2"  (1.575 m)    Weight:   Wt Readings from Last 1 Encounters:  07/23/18 134.2 kg    Ideal Body Weight:  50 kg  BMI:  Body mass index is 54.1 kg/m.  Estimated Nutritional Needs:   Kcal:  2230-2510 (16-18 kcal/kg)  Protein:  135-145 grams  Fluid:  >/= 2.3 L/day     Jarome Matin, MS, RD, LDN, [facility] Clinical Dietitian Pager # [phone] After hours/weekend pager # [phone]

## 2018-07-24 NOTE — Progress Notes (Signed)
OT Cancellation Note  Patient Details Name: Priscila Bean MRN: [id number] DOB: 09/10/1981   Cancelled Treatment:    Reason Eval/Treat Not Completed: pt fatiqued.    Brittni Hult 07/24/2018, 11:32 AM  [name], OTR/L Acute Rehabilitation Services [phone] [facility] pager [phone] office 07/24/2018

## 2018-07-24 NOTE — Progress Notes (Signed)
PROGRESS NOTE    Dominique [name]  ZOX:[id number] DOB: 1980/11/26 DOA: 07/04/2018 PCP: Dominique Mccreedy, MD    Brief Narrative:  Dominique Mooreis an 37 y.o.femalepast medical history significant for morbid obesity obstructive sleep apnea Crohn's, status post perforation with colostomy chronic wounds and abdomen and bilateral groin, who was in his usual health until she developed recurrent nausea and vomiting several days ago. She presented to the ED for lightheaded sensation when she was sitting or standing up. HR noted to be 140s, sinus tachycardia. Now treated for 7 days of Abx for multiple wounds with associated cellulitis and Sepsis. Awaiting SNF placement as she has no bed offers or payor source and cannot go home with Home Health due to extensive wound care that needs to be done. Awaiting Bed Placement  Hospitalization was complicated by sinus tachycardia, hyponatremia, and eye redness and swelling which is now all improved or improving.  Patient was moved off her telemetry today and she has been deemed stable for discharge but awaiting placement as mentioned above.   Assessment & Plan:   Principal Problem:   Hypovolemia Active Problems:   Orthostatic hypotension   Crohn's colitis with perforation s/p left colectomy/colostomy 2016   OSA on CPAP   Chronic anemia   Multiple wounds of skin   Mild renal insufficiency   Hyponatremia   Malnutrition of moderate degree   Dehydration  Hypovolemia/ Orthostatic hypotension/ Hyponatremia, improved -after aggressive IV hydration, orthostatic hypotension & hyponatremia have resolved- she is tolerating oral intake.  IV fluids had to be resumed due to a increasing creatinine which has subsequently improved.  IV fluids currently discontinued.  Follow.   Anemia due to acute blood loss and Iron deficiency - has bleeding from wounds and her periods - Hb dropped from 9.4 >>7.9  - Iron level 14, Ferretin was 15 on 9/19 - 10/17>given 1 U PRBC  and a dose of IV Iron - started oral Iron and will continue Ferrous Gluconate 324 mg po BID - Hb/Hct stable at 10.0 -Continue to monitor for signs and symptoms of bleeding -Repeat CBC intermittently now  Metabolic acidosis/ AKI, improved -Secondary to prerenal azotemia.  - Treated with IVF and Bicarb tabs and improved  - Cr had improved- Bicarb improved to 23. - Cr went slightly up as BUN/Cr was 7/1.18 but now improved and is 5/0.79 after IVF Hydration -Bicarbonate tabs have stopped but may need to reinitiate  -Restarted IVF hydration with NS at 75 mL/hr 07/16/18 and was changed to 50 mL's per hour but now IVF have stopped -Repeat CMP in AM and if Bicarbonate low will start back on Bicarbonate tabs however bicarbonate is improved and CO2 is 23 -Continue to monitor CMP intermittently and repeat in a few days   Crohn's colitis with perforation s/p left colectomy/colostomy 2016 -Continue to follow stool output to ensure we are matching her output with oral and IV fluids -Hydration improved.  IV fluids have been discontinued.   Severe wounds with cellulitis and sepsis -Sepsis Physiology is improved  -Has chronic wounds in abdomen from prior surgery and new wounds due to moisture damage to skin in skin folds/ panus and vaginal area which are full thickness wounds  -Wounds are slightly improving -Added Oxycodone and PRN Dilaudid for pain not tolerable to p.o. medication which appears to be helping -S/p Completion of Bactrim Course -Wound care per nursing protocol -Inter-dry.  Indwelling urinary catheter, now removed - to prevent soiling of wounds -Discontinued 07/15/18 and patient able to urinate well  and is more mobile  Loss of appetite/moderate/nonsevere protein calorie malnutrition - has not been eating much at home and has lost about 30 lbs -Nutrition is consulted appreciate and further recommendations - Continue supplements with Ensure and live to 37 mils p.o. twice daily  between meals -Nutrition will be discontinued Protostat -Nutrition also ordering Premier protein once a day along with a Magic cup once a day with meals  OSA -Ordered for CPAP but patient continuously refusing  Super Morbid Obesity -Estimated body mass index is 54.56 kg/m as calculated from the following:   Height as of this encounter: 5' 2"  (1.575 m).   Weight as of this encounter: 135.3 kg. -Weight loss Counseling given   Severe Deconditioning -Meets criteria to get rehab at SNF which she is in agreement with but is awaiting insurance approval -Not appropriate for CIR -PT/OT Recommended SNF initially but now has improved and recommending Home Health however patient unable to go Home with Home Health given her current wound care status and location.  Patient has no caregivers and has tried to encompass home health in the past and patient's wounds have gotten worse so she requires a skilled nursing facility placement even though she worked well and has improved mobility with PT  Sinus Tachycardia, improved -In the setting of Severe Deconditioning and Dehydration -Improved with fluid bolus hydration and IV fluids and beta-blocker..  Telemetry has been discontinued.  -Started low dose Metoprolol 25 mg po BID and increased to 50 mg po BID and will keep at 50 mg po BID given control and may possibly even increase to 75 mg po BID we will hold off for now -Sinus Tachycardia Improved.  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 From using a dirty rag to wipe eye -Dr. Alfredia [name] discussed case with Opthamology Dr. Nancy [name] who recommended using artificial tears currently for supportive care and removal of the dirty rag -If is not improving ophthalmology consult in few days however patient is seen ophthalmology in the past and this is not a new issue -Patient thinks eyes slightly improved however still very  erythematous -Tobramycin eyedrops have been started we will continue for now.   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  Bilateral Knee Warmth and Swelling, stable -Right Knee worse than Left.  Likely secondary to osteoarthritis. -X-Rays were un-revealing and consistent with tricompartmental osteoarthritis. -Continue to Monitor and Up with Therapy  -Pain management.  Hypomagnesemia  -Patient magnesium level was 1.6 and improved to 1.8.  Repeat magnesium level this morning is 2.1. Follow.   DVT prophylaxis: Patient refusing Lovenox. Code Status: Full Family Communication: Updated patient.  No family at bedside. Disposition Plan: Skilled nursing facility when bed available.   Consultants:   Wound care: Grant Ruts, RN 07/06/2018  Procedures:   Plain films of the left knee 07/15/2018  Antimicrobials:   Bactrim 07/07/2018>>>>> 07/14/2018     Subjective: Patient sleeping however easily arousable.  No chest pain.  No shortness of breath.  No lightheadedness.  No dizziness.  IV access lost per RN.  Objective: Vitals:  07/23/18 0627 07/23/18 1400 07/23/18 2101 07/24/18 0521  BP: 125/64 127/68 124/62 (!) 107/56  Pulse: 97 88 89 83  Resp: 18 18 20 18   Temp: 97.9 F (36.6 C) 98.1 F (36.7 C) 98 F (36.7 C) 98.7 F (37.1 C)  TempSrc: Oral Oral Oral Oral  SpO2: 96% 97% 96% 95%  Weight:      Height:        Intake/Output Summary (Last 24 hours) at 07/24/2018 1209 Last data filed at 07/24/2018 0600 Gross per 24 hour  Intake 720.12 ml  Output 200 ml  Net 520.12 ml   Filed Weights   07/21/18  0453 07/22/18 0549 07/23/18 0613  Weight: 135.8 kg 134.4 kg 134.2 kg    Examination:  General exam: NAD.  Respiratory system: Lungs clear to auscultation bilaterally anterior lung fields.  No wheezes, no crackles, no rhonchi. Cardiovascular system: RRR no murmurs rubs or gallops.  No JVD.  No lower extremity edema.   Gastrointestinal system: Abdomen is nontender, nondistended, soft, positive bowel sounds.  No rebound.  No guarding.  Colostomy bag intact.  Central nervous system: Alert and oriented. No focal neurological deficits. Extremities: Symmetric 5 x 5 power. Skin: Extensive full-thickness abdominal wounds as well as in the groin area and folds of the pannus.  Slight drainage.  Psychiatry: Judgement and insight appear normal. Mood & affect appropriate.     Data Reviewed: I have personally reviewed following labs and imaging studies  CBC: Recent Labs  Lab 07/18/18 0503 07/19/18 0533 07/20/18 0545 07/23/18 0555  WBC 6.2 6.8 6.3  --   NEUTROABS 2.9 3.6 3.6  --   HGB 10.4* 10.0* 10.3* 10.0*  HCT 36.8 34.1* 35.1* 34.4*  MCV 87.4 84.2 84.2  --   PLT 382 340 394  --    Basic Metabolic Panel: Recent Labs  Lab 07/18/18 0503 07/19/18 0533 07/20/18 0545 07/23/18 0555 07/24/18 0546  NA 138 139 139 139  --   K 4.3 4.9 4.9 4.8  --   CL 109 110 110 107  --   CO2 23 20* 23 24  --   GLUCOSE 86 86 94 85  --   BUN 7 7 5* 9  --   CREATININE 0.90 0.81 0.79 0.77  --   CALCIUM 8.7* 8.7* 8.9 8.8*  --   MG 1.8 1.6* 1.8 1.6* 2.1  PHOS 3.7 3.7 3.9  --   --    GFR: Estimated Creatinine Clearance: 126 mL/min (by C-G formula based on SCr of 0.77 mg/dL). Liver Function Tests: Recent Labs  Lab 07/18/18 0503 07/19/18 0533 07/20/18 0545  AST 37 31 27  ALT 26 23 20   ALKPHOS 78 73 69  BILITOT 0.6 0.6 0.6  PROT 6.8 6.4* 6.1*  ALBUMIN 2.7* 2.4* 2.4*   No results for input(s): LIPASE, AMYLASE in the last 168 hours. No results for input(s): AMMONIA in the last 168  hours. Coagulation Profile: No results for input(s): INR, PROTIME in the last 168 hours. Cardiac Enzymes: No results for input(s): CKTOTAL, CKMB, CKMBINDEX, TROPONINI in the last 168 hours. BNP (last 3 results) No results for input(s): PROBNP in the last 8760 hours. HbA1C: No results for input(s): HGBA1C in the last 72 hours. CBG: No results for input(s): GLUCAP in the last 168 hours. Lipid Profile: No results for input(s): CHOL, HDL, LDLCALC, TRIG, CHOLHDL, LDLDIRECT in the last 72 hours. Thyroid Function Tests: No results for input(s): TSH, T4TOTAL, FREET4, T3FREE, THYROIDAB in the last 72 hours. Anemia  Panel: No results for input(s): VITAMINB12, FOLATE, FERRITIN, TIBC, IRON, RETICCTPCT in the last 72 hours. Sepsis Labs: No results for input(s): PROCALCITON, LATICACIDVEN in the last 168 hours.  No results found for this or any previous visit (from the past 240 hour(s)).       Radiology Studies: No results found.      Scheduled Meds: . feeding supplement (ENSURE ENLIVE)  237 mL Oral Q24H  . ferrous gluconate  324 mg Oral BID WC  . metoprolol tartrate  50 mg Oral BID  . multivitamin with minerals  1 tablet Oral Daily  . protein supplement shake  11 oz Oral Q24H  . sodium chloride flush  3 mL Intravenous Q12H  . tobramycin  1 drop Right Eye Q4H while awake   Continuous Infusions: . sodium chloride Stopped (07/23/18 1100)     LOS: 18 days    Time spent: 35 minutes    Irine Seal, MD [facility] Pager [phone]  If 7PM-7AM, please contact night-coverage [email or web address] Password [email or web address] 07/24/2018, 12:09 PM

## 2018-07-24 NOTE — Progress Notes (Signed)
MD made aware of IV team RN being unable to place midline and of his suggestion for IV access. No new orders placed at this time

## 2018-07-24 NOTE — Progress Notes (Signed)
Patient refused to have wound care performed earlier in shift when attempted

## 2018-07-24 NOTE — Progress Notes (Signed)
Per dayshift RN, attempts for IV access have been unsuccessful multiple times. Attending MD has been made aware. Will continue to monitor pt closely. Carnella [place] I

## 2018-07-24 NOTE — Progress Notes (Signed)
PT Cancellation Note  Patient Details Name: Dominique [name] MRN: [id number] DOB: [birth year]   Cancelled Treatment:    Reason Eval/Treat Not Completed: Pain limiting ability to participate. Pt declined PT today. Will check back another day.    Weston Anna, PT Acute Rehabilitation Services Pager: [phone] Office: [phone]

## 2018-07-25 NOTE — Progress Notes (Signed)
PROGRESS NOTE    Dominique [name]  ZGY:[id number] DOB: 11-02-1980 DOA: 07/04/2018 PCP: Benito Mccreedy, MD    Brief Narrative:  Dominique Mooreis an 37 y.o.femalepast medical history significant for morbid obesity obstructive sleep apnea Crohn's, status post perforation with colostomy chronic wounds and abdomen and bilateral groin, who was in his usual health until she developed recurrent nausea and vomiting several days ago. She presented to the ED for lightheaded sensation when she was sitting or standing up. HR noted to be 140s, sinus tachycardia. Now treated for 7 days of Abx for multiple wounds with associated cellulitis and Sepsis. Awaiting SNF placement as she has no bed offers or payor source and cannot go home with Home Health due to extensive wound care that needs to be done. Awaiting Bed Placement  Hospitalization was complicated by sinus tachycardia, hyponatremia, and eye redness and swelling which is now all improved or improving.  Patient was moved off her telemetry today and she has been deemed stable for discharge but awaiting placement as mentioned above.   Assessment & Plan:   Principal Problem:   Hypovolemia Active Problems:   Orthostatic hypotension   Crohn's colitis with perforation s/p left colectomy/colostomy 2016   OSA on CPAP   Chronic anemia   Multiple wounds of skin   Mild renal insufficiency   Hyponatremia   Malnutrition of moderate degree   Dehydration  Hypovolemia/ Orthostatic hypotension/ Hyponatremia, improved -after aggressive IV hydration, orthostatic hypotension & hyponatremia have resolved- she is tolerating oral intake.  IV fluids had to be resumed due to a increasing creatinine which has subsequently improved.  IV fluids currently discontinued.  Follow.   Anemia due to acute blood loss and Iron deficiency - has bleeding from wounds and her periods - Hb dropped from 9.4 >>7.9  - Iron level 14, Ferretin was 15 on 9/19 - 10/17>given 1 U PRBC  and a dose of IV Iron - started oral Iron and will continue Ferrous Gluconate 324 mg po BID - Hb/Hct stable at 10.0 -Continue to monitor for signs and symptoms of bleeding -Repeat CBC intermittently now  Metabolic acidosis/ AKI, improved -Secondary to prerenal azotemia.  - Treated with IVF and Bicarb tabs and improved  - Cr had improved- Bicarb improved to 23. - Cr went slightly up as BUN/Cr was 7/1.18 but now improved and is 5/0.79 after IVF Hydration -Bicarbonate tabs have stopped but may need to reinitiate  -Restarted IVF hydration with NS at 75 mL/hr 07/16/18 and was changed to 50 mL's per hour but now IVF have stopped -Repeat CMP in AM and if Bicarbonate low will start back on Bicarbonate tabs however bicarbonate is improved and CO2 is 23 -Continue to monitor CMP intermittently and repeat in a few days   Crohn's colitis with perforation s/p left colectomy/colostomy 2016 -Continue to follow stool output to ensure we are matching her output with oral and IV fluids -Hydration improved.  IV fluids have been discontinued.   Severe wounds with cellulitis and sepsis -Sepsis Physiology is improved  -Has chronic wounds in abdomen from prior surgery and new wounds due to moisture damage to skin in skin folds/ panus and vaginal area which are full thickness wounds  -Wounds are slightly improving -Added Oxycodone and PRN Dilaudid for pain not tolerable to p.o. medication which appears to be helping -S/p Completion of Bactrim Course -Wound care per nursing protocol -Inter-dry.  Indwelling urinary catheter, now removed - to prevent soiling of wounds -Discontinued 07/15/18 and patient able to urinate well  and is more mobile  Loss of appetite/moderate/nonsevere protein calorie malnutrition - has not been eating much at home and has lost about 30 lbs -Nutrition is consulted appreciate and further recommendations - Continue supplements with Ensure and live to 37 mils p.o. twice daily  between meals -Nutrition will be discontinued Protostat -Nutrition also ordering Premier protein once a day along with a Magic cup once a day with meals  OSA -Ordered for CPAP but patient continuously refusing  Super Morbid Obesity -Estimated body mass index is 54.56 kg/m as calculated from the following:   Height as of this encounter: 5' 2"  (1.575 m).   Weight as of this encounter: 135.3 kg. -Weight loss Counseling given   Severe Deconditioning -Meets criteria to get rehab at SNF which she is in agreement with but is awaiting insurance approval -Not appropriate for CIR -PT/OT Recommended SNF initially but now has improved and recommending Home Health however patient unable to go Home with Home Health given her current wound care status and location.  Patient has no caregivers and has tried to encompass home health in the past and patient's wounds have gotten worse so she requires a skilled nursing facility placement even though she worked well and has improved mobility with PT  Sinus Tachycardia, improved -In the setting of Severe Deconditioning and Dehydration -Improved with fluid bolus hydration and IV fluids and beta-blocker..  Telemetry has been discontinued.  -Started low dose Metoprolol 25 mg po BID and increased to 50 mg po BID and will keep at 50 mg po BID given control and may possibly even increase to 75 mg po BID we will hold off for now -Sinus Tachycardia Improved.  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 From using a dirty rag to wipe eye -Dr. Alfredia Ferguson discussed case with Opthamology Dr. Nancy Fetter who recommended using artificial tears currently for supportive care and removal of the dirty rag -If is not improving ophthalmology consult in few days however patient is seen ophthalmology in the past and this is not a new issue -Patient thinks eyes slightly improved however still very  erythematous -Tobramycin eyedrops have been started we will continue for now.   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  Bilateral Knee Warmth and Swelling, stable -Right Knee worse than Left.  Likely secondary to osteoarthritis. -X-Rays were un-revealing and consistent with tricompartmental osteoarthritis. -Continue to Monitor and Up with Therapy  -Pain management.  Hypomagnesemia  -Patient magnesium level was 1.6 and improved to 1.8.  Repeat magnesium level was 2.1 on 07/24/2018. Follow.   DVT prophylaxis: Patient refusing Lovenox. Code Status: Full Family Communication: Updated patient.  No family at bedside. Disposition Plan: Skilled nursing facility when bed available.   Consultants:   Wound care: Grant Ruts, RN 07/06/2018  Procedures:   Plain films of the left knee 07/15/2018  Antimicrobials:   Bactrim 07/07/2018>>>>> 07/14/2018     Subjective: Patient sleeping.  Easily arousable.  No chest pain.  No shortness of breath.  No dizziness.  No lightheadedness.  Patient states is usually up most nights.   Objective:  Vitals:   07/24/18 1400 07/24/18 1941 07/24/18 2155 07/25/18 0346  BP: 110/63 (!) 108/46 (!) 110/56 (!) 115/52  Pulse: 79 89 85 81  Resp: 18 18 16 18   Temp: 98.1 F (36.7 C) 98 F (36.7 C) 97.7 F (36.5 C) 98.3 F (36.8 C)  TempSrc: Oral Oral Oral Oral  SpO2: 96% 97% 99% 93%  Weight:    134.8 kg  Height:        Intake/Output Summary (Last 24 hours) at 07/25/2018 1123 Last data filed at 07/25/2018 0600 Gross per 24 hour  Intake 970 ml  Output 100 ml  Net 870 ml    Filed Weights   07/22/18 0549 07/23/18 0613 07/25/18 0346  Weight: 134.4 kg 134.2 kg 134.8 kg    Examination:  General exam: NAD.  Respiratory system: CTAB anterior lung fields.  No wheezes, no crackles, no rhonchi.  Cardiovascular system: Regular rate and rhythm no murmurs rubs or gallops.  No JVD.  No lower extremity edema.  Gastrointestinal system: Abdomen is soft, nontender, nondistended, positive bowel sounds.  No rebound.  No guarding.  Colostomy bag intact.  Central nervous system: Alert and oriented. No focal neurological deficits. Extremities: Symmetric 5 x 5 power. Skin: Extensive full-thickness abdominal wounds as well as in the groin area and folds of the pannus.  Slight drainage.  Psychiatry: Judgement and insight appear normal. Mood & affect appropriate.     Data Reviewed: I have personally reviewed following labs and imaging studies  CBC: Recent Labs  Lab 07/19/18 0533 07/20/18 0545 07/23/18 0555  WBC 6.8 6.3  --   NEUTROABS 3.6 3.6  --   HGB 10.0* 10.3* 10.0*  HCT 34.1* 35.1* 34.4*  MCV 84.2 84.2  --   PLT 340 394  --    Basic Metabolic Panel: Recent Labs  Lab 07/19/18 0533 07/20/18 0545 07/23/18 0555 07/24/18 0546  NA 139 139 139  --   K 4.9 4.9 4.8  --   CL 110 110 107  --   CO2 20* 23 24  --   GLUCOSE 86 94 85  --   BUN 7 5* 9  --   CREATININE 0.81 0.79 0.77  --   CALCIUM 8.7* 8.9 8.8*  --   MG 1.6* 1.8 1.6* 2.1  PHOS 3.7 3.9  --   --    GFR: Estimated Creatinine Clearance: 126.4 mL/min (by C-G formula based on SCr of 0.77 mg/dL). Liver Function Tests: Recent Labs  Lab 07/19/18 0533 07/20/18 0545  AST 31 27  ALT 23 20  ALKPHOS 73 69  BILITOT 0.6 0.6  PROT 6.4* 6.1*  ALBUMIN 2.4* 2.4*   No results for input(s): LIPASE, AMYLASE in the last 168 hours. No results for input(s): AMMONIA in the last 168 hours. Coagulation Profile: No results for input(s): INR, PROTIME in the last 168 hours. Cardiac Enzymes: No results for input(s):  CKTOTAL, CKMB, CKMBINDEX, TROPONINI in the last 168 hours. BNP (last 3 results) No results for input(s): PROBNP in the last 8760 hours. HbA1C: No results for input(s): HGBA1C in the last 72 hours. CBG: No results for input(s): GLUCAP in the last 168 hours. Lipid Profile: No results for input(s): CHOL, HDL, LDLCALC, TRIG, CHOLHDL, LDLDIRECT in the last 72 hours. Thyroid Function Tests: No results for input(s): TSH, T4TOTAL, FREET4, T3FREE, THYROIDAB in the last 72 hours. Anemia Panel: No results for input(s): VITAMINB12, FOLATE, FERRITIN, TIBC, IRON, RETICCTPCT in the last 72 hours. Sepsis Labs: No results for input(s): PROCALCITON,  LATICACIDVEN in the last 168 hours.  No results found for this or any previous visit (from the past 240 hour(s)).       Radiology Studies: [place] [facility] R[facility]  Result Date: 07/24/2018 If [facility] image not attached, placement could not be confirmed due to current cardiac rhythm.       Scheduled Meds: . feeding supplement (ENSURE ENLIVE)  237 mL Oral Q24H  . ferrous gluconate  324 mg Oral BID WC  . metoprolol tartrate  50 mg Oral BID  . multivitamin with minerals  1 tablet Oral Daily  . protein supplement shake  11 oz Oral Q24H  . sodium chloride flush  3 mL Intravenous Q12H  . tobramycin  1 drop Right Eye Q4H while awake   Continuous Infusions: . sodium chloride Stopped (07/23/18 1100)     LOS: 19 days    Time spent: 35 minutes    Irine Seal, MD [facility] Pager [phone]  If 7PM-7AM, please contact night-coverage [email or web address] Password [facility] 07/25/2018, 11:23 AM

## 2018-07-25 NOTE — Progress Notes (Signed)
Pt took a shower this shift and all dressings have been changed. Pt tolerated fair.

## 2018-07-25 NOTE — Progress Notes (Signed)
Pt is refusing to have dressings changed during this shift; she states she is tired and wants me to "come back later." Will continue to monitor. Carnella [place] I

## 2018-07-26 NOTE — Progress Notes (Signed)
Pt encouraged to get OOB and bathe this AM so dressings can be changed. Pt stated she wanted to wait until "between 1 or 2." Pt then stated she wants to wait until 4 PM. Will attempt again at that time.

## 2018-07-26 NOTE — Progress Notes (Signed)
PROGRESS NOTE    Dominique [name] DOB: 12/27/1980 DOA: 07/04/2018 PCP: Benito Mccreedy, MD    Brief Narrative:  Dominique [name] an 37 y.o.femalepast medical history significant for morbid obesity obstructive sleep apnea Crohn's, status post perforation with colostomy chronic wounds and abdomen and bilateral groin, who was in his usual health until she developed recurrent nausea and vomiting several days ago. She presented to the ED for lightheaded sensation when she was sitting or standing up. HR noted to be 140s, sinus tachycardia. Now treated for 7 days of Abx for multiple wounds with associated cellulitis and Sepsis. Awaiting SNF placement as she has no bed offers or payor source and cannot go home with Home Health due to extensive wound care that needs to be done. Awaiting Bed Placement  Hospitalization was complicated by sinus tachycardia, hyponatremia, and eye redness and swelling which is now all improved or improving.  Dominique [name] and she has been deemed stable for discharge but awaiting placement as mentioned above.   Assessment & Plan:   Principal Problem:   Hypovolemia Active Problems:   Orthostatic hypotension   Crohn's colitis with perforation s/p left colectomy/colostomy 2016   OSA on CPAP   Chronic anemia   Multiple wounds of skin   Mild renal insufficiency   Hyponatremia   Malnutrition of moderate degree   Dehydration  Hypovolemia/ Orthostatic hypotension/ Hyponatremia, improved -after aggressive IV hydration, orthostatic hypotension & hyponatremia have resolved- she is tolerating oral intake.  IV fluids had to be resumed due to a increasing creatinine which has subsequently improved.  IV fluids currently discontinued.  Follow.   Anemia due to acute blood loss and Iron deficiency - has bleeding from wounds and her periods - Hb dropped from 9.4 >>7.9  - Iron level 14, Ferretin was 15 on 9/19 - 10/17>given 1 U PRBC  and a dose of IV Iron - started oral Iron and will continue Ferrous Gluconate 324 mg po BID - Hb/Hct stable at 10.0 -Continue to monitor for signs and symptoms of bleeding -Repeat CBC intermittently now  Metabolic acidosis/ AKI, improved -Secondary to prerenal azotemia.  - Treated with IVF and Bicarb tabs and improved  - Cr had improved- Bicarb improved to 23. - Cr went slightly up as BUN/Cr was 7/1.18 but now improved and is 5/0.79 after IVF Hydration -Bicarbonate tabs have stopped but may need to reinitiate  -Restarted IVF hydration with NS at 75 mL/hr 07/16/18 and was changed to 50 mL's per hour but now IVF have stopped -Repeat CMP in AM and if Bicarbonate low will start back on Bicarbonate tabs however bicarbonate is improved and CO2 is 23 -Continue to monitor CMP intermittently and repeat in a few days   Crohn's colitis with perforation s/p left colectomy/colostomy 2016 -Continue to follow stool output to ensure we are matching her output with oral and IV fluids -Hydration improved.  IV fluids have been discontinued.   Severe wounds with cellulitis and sepsis -Sepsis Physiology is improved  -Has chronic wounds in abdomen from prior surgery and new wounds due to moisture damage to skin in skin folds/ panus and vaginal area which are full thickness wounds  -Wounds are slowly improving -Added Oxycodone and PRN Dilaudid for pain not tolerable to p.o. medication which appears to be helping -S/p Completion of Bactrim Course -Wound care per nursing protocol -Inter-dry.  Indwelling urinary catheter, now removed - to prevent soiling of wounds -Discontinued 07/15/18 and Dominique able to urinate well  and is more mobile  Loss of appetite/moderate/nonsevere protein calorie malnutrition - has not been eating much at home and has lost about 30 lbs -Nutrition consulted and following.   - Continue nutritional supplements. -Nutrition will be discontinued Protostat -Nutrition also  ordering Premier protein once a day along with a Magic cup once a day with meals  OSA -CPAP nightly however Dominique refusing.    Super Morbid Obesity -Estimated body mass index is 54.56 kg/m as calculated from the following:   Height as of this encounter: 5' 2"  (1.575 m).   Weight as of this encounter: 135.3 kg. -Weight loss Counseling given   Severe Deconditioning -Meets criteria to get rehab at SNF which she is in agreement with but is awaiting insurance approval -Not appropriate for CIR -PT/OT Recommended SNF initially but now has improved and recommending Home Health however Dominique unable to go Home with Home Health given her current wound care status and location.  Dominique has no caregivers and has tried to encompass home health in the past and Dominique's wounds have gotten worse so she requires a skilled nursing facility placement even though she worked well and has improved mobility with PT  Sinus Tachycardia, improved -In the setting of Severe Deconditioning and Dehydration -Improved with fluid bolus hydration and IV fluids and beta-blocker..  Telemetry has been discontinued.  -Started low dose Metoprolol 25 mg po BID and increased to 50 mg po BID and will keep at 50 mg po BID given control and may possibly even increase to 75 mg po BID we will hold off for now -Sinus Tachycardia Improved.  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 From using a dirty rag to wipe eye -Dr. Alfredia Ferguson discussed case with Opthamology Dr. Nancy Fetter who recommended using artificial tears currently for supportive care and removal of the dirty rag -If is not improving ophthalmology consult in few days however Dominique is seen ophthalmology in the past and this is not a new issue -Dominique thinks eyes slightly improved however still very erythematous -Tobramycin eyedrops have been started we will continue for now.   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  Bilateral Knee Warmth and Swelling, stable -Right Knee worse than Left.  Likely secondary to osteoarthritis. -X-Rays were un-revealing and consistent with tricompartmental osteoarthritis. -Continue to Monitor and Up with Therapy  -Pain management.  Hypomagnesemia  -Dominique magnesium level was 1.6 and improved to 1.8.  Repeat magnesium level was 2.1 on 07/24/2018. Follow.   DVT prophylaxis: Dominique refusing Lovenox. Code Status: Full Family Communication: Updated Dominique.  No family at bedside. Disposition Plan: Skilled nursing facility when bed available.   Consultants:   Wound care: Grant Ruts, RN 07/06/2018  Procedures:   Plain films of the left knee 07/15/2018  Antimicrobials:   Bactrim 07/07/2018>>>>> 07/14/2018     Subjective: Dominique sleeping however easily arousable.  Denies any chest pain or shortness of breath.  No dizziness.  No lightheadedness.    Objective: Vitals:   07/25/18 0346 07/25/18 1306 07/25/18 2201 07/26/18 0611  BP: (!) 115/52 104/65 (!) 121/49 Marland Kitchen)  104/55  Pulse: 81 86 87 92  Resp: 18 19 16 14   Temp: 98.3 F (36.8 C) 98.1 F (36.7 C) 97.6 F (36.4 C) 98.6 F (37 C)  TempSrc: Oral Oral Oral Oral  SpO2: 93% 100% 98% 94%  Weight: 134.8 kg   134.1 kg  Height:        Intake/Output Summary (Last 24 hours) at 07/26/2018 1154 Last data filed at 07/26/2018 0200 Gross per 24 hour  Intake 480 ml  Output 100 ml  Net 380 ml   Filed Weights   07/23/18 0613 07/25/18 0346 07/26/18 0611  Weight: 134.2 kg 134.8 kg 134.1 kg    Examination:  General exam:  No acute distress. Respiratory system: Clear to auscultation bilaterally anterior lung fields.  No wheezes, no crackles, no rhonchi.   Cardiovascular system: RRR no murmurs rubs or gallops.  No lower extremity edema.  No JVD.  Gastrointestinal system: Abdomen is nontender, nondistended, soft, positive bowel sounds.  No rebound.  No guarding.  Colostomy bag intact.  Dressing on anterior abdomen over wounds.   Central nervous system: Alert and oriented. No focal neurological deficits. Extremities: Symmetric 5 x 5 power. Skin: Extensive full-thickness abdominal wounds as well as in the groin area and folds of the pannus.  Slight drainage.  Psychiatry: Judgement and insight appear normal. Mood & affect appropriate.     Data Reviewed: I have personally reviewed following labs and imaging studies  CBC: Recent Labs  Lab 07/20/18 0545 07/23/18 0555  WBC 6.3  --   NEUTROABS 3.6  --   HGB 10.3* 10.0*  HCT 35.1* 34.4*  MCV 84.2  --   PLT 394  --    Basic Metabolic Panel: Recent Labs  Lab 07/20/18 0545 07/23/18 0555 07/24/18 0546  NA 139 139  --   K 4.9 4.8  --   CL 110 107  --   CO2 23 24  --   GLUCOSE 94 85  --   BUN 5* 9  --   CREATININE 0.79 0.77  --   CALCIUM 8.9 8.8*  --   MG 1.8 1.6* 2.1  PHOS 3.9  --   --    GFR: Estimated Creatinine Clearance: 126 mL/min (by C-G formula based on SCr of 0.77 mg/dL). Liver Function Tests: Recent Labs  Lab 07/20/18 0545  AST 27  ALT 20  ALKPHOS 69  BILITOT 0.6  PROT 6.1*  ALBUMIN 2.4*   No results for input(s): LIPASE, AMYLASE in the last 168 hours. No results for input(s): AMMONIA in the last 168 hours. Coagulation Profile: No results for input(s): INR, PROTIME in the last 168 hours. Cardiac Enzymes: No results for input(s): CKTOTAL, CKMB, CKMBINDEX, TROPONINI in the last 168 hours. BNP (last 3 results) No results for input(s): PROBNP in the last 8760 hours. HbA1C: No results for input(s): HGBA1C in the last 72  hours. CBG: No results for input(s): GLUCAP in the last 168 hours. Lipid Profile: No results for input(s): CHOL, HDL, LDLCALC, TRIG, CHOLHDL, LDLDIRECT in the last 72 hours. Thyroid Function Tests: No results for input(s): TSH, T4TOTAL, FREET4, T3FREE, THYROIDAB in the last 72 hours. Anemia Panel: No results for input(s): VITAMINB12, FOLATE, FERRITIN, TIBC, IRON, RETICCTPCT in the last 72 hours. Sepsis Labs: No results for input(s): PROCALCITON, LATICACIDVEN in the last 168 hours.  No results found for this or any previous visit (from the past 240 hour(s)).       Radiology Studies: [place] [facility]  Result Date:  07/24/2018 If Site Rite image not attached, placement could not be confirmed due to current cardiac rhythm.       Scheduled Meds: . feeding supplement (ENSURE ENLIVE)  237 mL Oral Q24H  . ferrous gluconate  324 mg Oral BID WC  . metoprolol tartrate  50 mg Oral BID  . multivitamin with minerals  1 tablet Oral Daily  . protein supplement shake  11 oz Oral Q24H  . sodium chloride flush  3 mL Intravenous Q12H  . tobramycin  1 drop Right Eye Q4H while awake   Continuous Infusions: . sodium chloride Stopped (07/23/18 1100)     LOS: 20 days    Time spent: 35 minutes    Irine Seal, MD [facility] Pager [phone]  If 7PM-7AM, please contact night-coverage [email or web address] Password [email or web address] 07/26/2018, 11:54 AM

## 2018-07-27 MED ORDER — OXYCODONE HCL 5 MG PO TABS
15.0000 mg | ORAL_TABLET | ORAL | Status: DC | PRN
  Administered 2018-07-27 – 2018-08-24 (×91): 15 mg via ORAL
  Filled 2018-07-27 (×100): qty 3

## 2018-07-27 MED ORDER — OXYCODONE HCL ER 10 MG PO T12A: 10.0000 mg | EXTENDED_RELEASE_TABLET | Freq: Two times a day (BID) | ORAL | Status: DC

## 2018-07-27 MED ORDER — TOBRAMYCIN 0.3 % OP SOLN
1.0000 [drp] | OPHTHALMIC | Status: AC
  Administered 2018-07-27 – 2018-07-29 (×7): 1 [drp] via OPHTHALMIC
  Filled 2018-07-27: qty 5

## 2018-07-27 NOTE — Progress Notes (Signed)
Patient continues to delay dressing changes; states she will get out of bed to shower after taking her pain medication. When asked at a later time, she states her eyes hurt and she does not want to shower at this time. Will continue to monitor. Dominique [name] I

## 2018-07-27 NOTE — Progress Notes (Signed)
PROGRESS NOTE    Dominique [name]  HGD:[id number] DOB: 23-Jun-1981 DOA: 07/04/2018 PCP: Benito Mccreedy, MD    Brief Narrative:  Dominique [name] an 37 y.o.femalepast medical history significant for morbid obesity obstructive sleep apnea Crohn's, status post perforation with colostomy chronic wounds and abdomen and bilateral groin, who was in his usual health until she developed recurrent nausea and vomiting several days ago. She presented to the ED for lightheaded sensation when she was sitting or standing up. HR noted to be 140s, sinus tachycardia. Now treated for 7 days of Abx for multiple wounds with associated cellulitis and Sepsis. Awaiting SNF placement as she has no bed offers or payor source and cannot go home with Home Health due to extensive wound care that needs to be done. Awaiting Bed Placement  Hospitalization was complicated by sinus tachycardia, hyponatremia, and eye redness and swelling which is now all improved or improving.  Patient was moved off her telemetry today and she has been deemed stable for discharge but awaiting placement as mentioned above.   Assessment & Plan:   Principal Problem:   Hypovolemia Active Problems:   Orthostatic hypotension   Crohn's colitis with perforation s/p left colectomy/colostomy 2016   OSA on CPAP   Chronic anemia   Multiple wounds of skin   Mild renal insufficiency   Hyponatremia   Malnutrition of moderate degree   Dehydration  Hypovolemia/ Orthostatic hypotension/ Hyponatremia, improved -after aggressive IV hydration, orthostatic hypotension & hyponatremia have resolved- she is tolerating oral intake.  IV fluids had to be resumed due to a increasing creatinine which has subsequently improved.  IV fluids currently discontinued.  Follow.   Anemia due to acute blood loss and Iron deficiency - has bleeding from wounds and her periods - Hb dropped from 9.4 >>7.9  - Iron level 14, Ferretin was 15 on 9/19 - 10/17>given 1 U PRBC  and a dose of IV Iron - started oral Iron and will continue Ferrous Gluconate 324 mg po BID - Hb/Hct stable at 10.0 -Continue to monitor for signs and symptoms of bleeding -Repeat CBC intermittently now  Metabolic acidosis/ AKI, improved -Secondary to prerenal azotemia.  - Treated with IVF and Bicarb tabs and improved  - Cr had improved- Bicarb improved to 23. - Cr went slightly up as BUN/Cr was 7/1.18 but now improved and is 5/0.79 after IVF Hydration -Bicarbonate tabs have stopped but may need to reinitiate  -Restarted IVF hydration with NS at 75 mL/hr 07/16/18 and was changed to 50 mL's per hour but now IVF have stopped -Repeat CMP in AM and if Bicarbonate low will start back on Bicarbonate tabs however bicarbonate is improved and CO2 is 23 -Continue to monitor CMP intermittently and repeat in a few days   Crohn's colitis with perforation s/p left colectomy/colostomy 2016 -Continue to follow stool output to ensure we are matching her output with oral and IV fluids -Hydration improved.  IV fluids have been discontinued.   Severe wounds with cellulitis and sepsis -Sepsis Physiology is improved  -Has chronic wounds in abdomen from prior surgery and new wounds due to moisture damage to skin in skin folds/ panus and vaginal area which are full thickness wounds  -Wounds are slowly improving -Added Oxycodone and PRN Dilaudid for pain not tolerable to p.o. medication which appears to be helping . Patient lost IV access and as such unable to receive IV Dilaudid.  Per RN oxycodone not managing her pain as well.  Increase oxycodone to 15 mg every 4  hours as needed. -S/p Completion of Bactrim Course -Wound care per nursing protocol -Inter-dry.  Indwelling urinary catheter, now removed - to prevent soiling of wounds -Discontinued 07/15/18 and patient able to urinate well and is more mobile  Loss of appetite/moderate/nonsevere protein calorie malnutrition - has not been eating much at  home and has lost about 30 lbs -Nutrition consulted and following.   - Continue nutritional supplements. -Nutrition will be discontinued Protostat -Nutrition also ordering Premier protein once a day along with a Magic cup once a day with meals  OSA -CPAP nightly however patient refusing.    Super Morbid Obesity -Estimated body mass index is 54.56 kg/m as calculated from the following:   Height as of this encounter: 5' 2"  (1.575 m).   Weight as of this encounter: 135.3 kg. -Weight loss Counseling given   Severe Deconditioning -Meets criteria to get rehab at SNF which she is in agreement with but is awaiting insurance approval -Not appropriate for CIR -PT/OT Recommended SNF initially but now has improved and recommending Home Health however patient unable to go Home with Home Health given her current wound care status and location.  Patient has no caregivers and has tried to encompass home health in the past and patient's wounds have gotten worse so she requires a skilled nursing facility placement even though she worked well and has improved mobility with PT  Sinus Tachycardia, improved -In the setting of Severe Deconditioning and Dehydration -Improved with fluid bolus hydration and IV fluids and beta-blocker..  Telemetry has been discontinued.  -Started low dose Metoprolol 25 mg po BID and increased to 50 mg po BID and will keep at 50 mg po BID given control and may possibly even increase to 75 mg po BID we will hold off for now -Sinus Tachycardia Improved.  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 From using a dirty rag to wipe eye.  Patient now with redness to both eyes. -Dr. Alfredia Ferguson discussed case with Opthamology Dr. Nancy Fetter who recommended using artificial tears currently for supportive care and removal of the dirty rag -If is not improving ophthalmology consult in few days however patient is seen ophthalmology  in the past and this is not a new issue -Patient thinks eyes slightly improved however still very erythematous and now left eye is also erythematous. -Tobramycin eyedrops have been started we will continue for now to both eyes.   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  Bilateral Knee Warmth and Swelling, stable -Right Knee worse than Left.  Likely secondary to osteoarthritis. -X-Rays were un-revealing and consistent with tricompartmental osteoarthritis. -Continue to Monitor and Up with Therapy  -Pain management.  Hypomagnesemia  -Patient magnesium level was 1.6 and improved to 1.8.  Repeat magnesium level was 2.1 on 07/24/2018. Follow.   DVT prophylaxis: Patient refusing Lovenox. Code Status: Full Family Communication: Updated patient.  No family at bedside. Disposition Plan: Skilled nursing facility when bed available.   Consultants:   Wound care: Grant Ruts, RN 07/06/2018  Procedures:   Plain films of the left knee 07/15/2018  Antimicrobials:  Bactrim 07/07/2018>>>>> 07/14/2018     Subjective: Patient laying in bed.  States both eyes and now red.  Patient states pain not controlled on current oral pain regimen after IV pain medications have been discontinued.  No lightheadedness.  No dizziness.  Tolerating oral intake.   Objective: Vitals:   07/26/18 1419 07/26/18 2227 07/27/18 0622 07/27/18 0821  BP:  110/62 (!) 94/51 106/63  Pulse:  86 89 90  Resp:  16 16   Temp:  98 F (36.7 C) 98.7 F (37.1 C)   TempSrc:  Oral Oral   SpO2: 94% 99% 92%   Weight:   134 kg   Height:         Intake/Output Summary (Last 24 hours) at 07/27/2018 1056 Last data filed at 07/27/2018 0200 Gross per 24 hour  Intake 240 ml  Output -  Net 240 ml   Filed Weights   07/25/18 0346 07/26/18 0611 07/27/18 0622  Weight: 134.8 kg 134.1 kg 134 kg    Examination:  General exam: NAD. Respiratory system: CTAB anterior lung fields.  No wheezes, no crackles, no rhonchi.   Cardiovascular system: Regular rate and rhythm no murmurs rubs or gallops.  No JVD.  No lower extremity edema.  Gastrointestinal system: Abdomen is soft, nontender, nondistended, positive bowel sounds.  Colostomy bag intact.  Abdominal wounds noted with some slight drainage.   Central nervous system: Alert and oriented. No focal neurological deficits. Extremities: Symmetric 5 x 5 power. Skin: Extensive full-thickness abdominal wounds as well as in the groin area and folds of the pannus.  Slight drainage.  Psychiatry: Judgement and insight appear normal. Mood & affect appropriate.     Data Reviewed: I have personally reviewed following labs and imaging studies  CBC: Recent Labs  Lab 07/23/18 0555  HGB 10.0*  HCT 09.3*   Basic Metabolic Panel: Recent Labs  Lab 07/23/18 0555 07/24/18 0546  NA 139  --   K 4.8  --   CL 107  --   CO2 24  --   GLUCOSE 85  --   BUN 9  --   CREATININE 0.77  --   CALCIUM 8.8*  --   MG 1.6* 2.1   GFR: Estimated Creatinine Clearance: 126 mL/min (by C-G formula based on SCr of 0.77 mg/dL). Liver Function Tests: No results for input(s): AST, ALT, ALKPHOS, BILITOT, PROT, ALBUMIN in the last 168 hours. No results for input(s): LIPASE, AMYLASE in the last 168 hours. No results for input(s): AMMONIA in the last 168 hours. Coagulation Profile: No results for input(s): INR, PROTIME in the last 168 hours. Cardiac Enzymes: No results for input(s): CKTOTAL, CKMB, CKMBINDEX, TROPONINI in the last 168 hours. BNP (last 3 results) No results for input(s): PROBNP in the last 8760  hours. HbA1C: No results for input(s): HGBA1C in the last 72 hours. CBG: No results for input(s): GLUCAP in the last 168 hours. Lipid Profile: No results for input(s): CHOL, HDL, LDLCALC, TRIG, CHOLHDL, LDLDIRECT in the last 72 hours. Thyroid Function Tests: No results for input(s): TSH, T4TOTAL, FREET4, T3FREE, THYROIDAB in the last 72 hours. Anemia Panel: No results for input(s): VITAMINB12, FOLATE, FERRITIN, TIBC, IRON, RETICCTPCT in the last 72 hours. Sepsis Labs: No results for input(s): PROCALCITON, LATICACIDVEN in the last 168 hours.  No results found for this or any previous visit (from the past 240 hour(s)).       Radiology Studies: No results found.      Scheduled Meds: . feeding supplement (ENSURE  ENLIVE)  237 mL Oral Q24H  . ferrous gluconate  324 mg Oral BID WC  . metoprolol tartrate  50 mg Oral BID  . multivitamin with minerals  1 tablet Oral Daily  . protein supplement shake  11 oz Oral Q24H  . sodium chloride flush  3 mL Intravenous Q12H  . tobramycin  1 drop Both Eyes Q4H while awake   Continuous Infusions: . sodium chloride Stopped (07/23/18 1100)     LOS: 21 days    Time spent: 35 minutes    Irine Seal, MD [facility] Pager [phone]  If 7PM-7AM, please contact night-coverage [email or web address] Password [email or web address] 07/27/2018, 10:56 AM

## 2018-07-28 MED ORDER — NAPHAZOLINE-GLYCERIN 0.012-0.2 % OP SOLN
1.0000 [drp] | Freq: Three times a day (TID) | OPHTHALMIC | Status: DC
  Filled 2018-07-28: qty 15

## 2018-07-28 MED ORDER — NAPHAZOLINE-GLYCERIN 0.012-0.2 % OP SOLN
1.0000 [drp] | Freq: Three times a day (TID) | OPHTHALMIC | Status: DC
  Administered 2018-07-28 – 2018-08-11 (×13): 1 [drp] via OPHTHALMIC
  Filled 2018-07-28: qty 15

## 2018-07-28 NOTE — Progress Notes (Signed)
PROGRESS NOTE    Dominique [name]  QPR:[id number] DOB: [birth year] DOA: 07/04/2018 PCP: Benito Mccreedy, MD    Brief Narrative:  Dominique [name] an 37 y.o.femalepast medical history significant for morbid obesity obstructive sleep apnea Crohn's, status post perforation with colostomy chronic wounds and abdomen and bilateral groin, who was in his usual health until she developed recurrent nausea and vomiting several days ago. She presented to the ED for lightheaded sensation when she was sitting or standing up. HR noted to be 140s, sinus tachycardia. Now treated for 7 days of Abx for multiple wounds with associated cellulitis and Sepsis. Awaiting SNF placement as she has no bed offers or payor source and cannot go home with Home Health due to extensive wound care that needs to be done. Awaiting Bed Placement  Hospitalization was complicated by sinus tachycardia, hyponatremia, and eye redness and swelling which is now all improved or improving.  Patient was moved off her telemetry today and she has been deemed stable for discharge but awaiting placement as mentioned above.   Assessment & Plan:   Principal Problem:   Hypovolemia Active Problems:   Orthostatic hypotension   Crohn's colitis with perforation s/p left colectomy/colostomy 2016   OSA on CPAP   Chronic anemia   Multiple wounds of skin   Mild renal insufficiency   Hyponatremia   Malnutrition of moderate degree   Dehydration  Hypovolemia/ Orthostatic hypotension/ Hyponatremia, improved -after aggressive IV hydration, orthostatic hypotension & hyponatremia have resolved- she is tolerating oral intake.  IV fluids had to be resumed due to a increasing creatinine which has subsequently improved.  IV fluids currently discontinued.  Follow.   Anemia due to acute blood loss and Iron deficiency - has bleeding from wounds and her periods - Hb dropped from 9.4 >>7.9  - Iron level 14, Ferretin was 15 on 9/19 - 10/17>given 1 U PRBC  and a dose of IV Iron - started oral Iron and will continue Ferrous Gluconate 324 mg po BID - Hb/Hct stable at 10.0 -Continue to monitor for signs and symptoms of bleeding -Repeat CBC intermittently now  Metabolic acidosis/ AKI, improved -Secondary to prerenal azotemia.  - Treated with IVF and Bicarb tabs and improved  - Cr had improved- Bicarb improved to 23. - Cr went slightly up as BUN/Cr was 7/1.18 but now improved and is 5/0.79 after IVF Hydration -Bicarbonate tabs have stopped but may need to reinitiate  -Restarted IVF hydration with NS at 75 mL/hr 07/16/18 and was changed to 50 mL's per hour but now IVF have stopped -Repeat CMP in AM and if Bicarbonate low will start back on Bicarbonate tabs however bicarbonate is improved and CO2 is 23 -Continue to monitor CMP intermittently and repeat in a few days   Crohn's colitis with perforation s/p left colectomy/colostomy 2016 -Continue to follow stool output to ensure we are matching her output with oral and IV fluids -Hydration improved.  IV fluids have been discontinued.   Severe wounds with cellulitis and sepsis -Sepsis Physiology is improved  -Has chronic wounds in abdomen from prior surgery and new wounds due to moisture damage to skin in skin folds/ panus and vaginal area which are full thickness wounds  -Wounds are slowly improving -Added Oxycodone and PRN Dilaudid for pain not tolerable to p.o. medication which appears to be helping . Patient lost IV access and as such unable to receive IV Dilaudid.  Per RN oxycodone not managing her pain as well and as such oxycodone was increased to 15  mg every 4 hours as needed.  Patient states some improvement with pain management. -S/p Completion of Bactrim Course -Wound care per nursing protocol -Inter-dry.  Indwelling urinary catheter, now removed - to prevent soiling of wounds -Discontinued 07/15/18 and patient able to urinate well and is more mobile  Loss of  appetite/moderate/nonsevere protein calorie malnutrition - has not been eating much at home and has lost about 30 lbs -Nutrition consulted and following.   - Continue nutritional supplements. -Nutrition will be discontinued Protostat -Nutrition also ordering Premier protein once a day along with a Magic cup once a day with meals  OSA -CPAP nightly however patient refusing.    Super Morbid Obesity -Estimated body mass index is 54.56 kg/m as calculated from the following:   Height as of this encounter: 5' 2"  (1.575 m).   Weight as of this encounter: 135.3 kg. -Weight loss Counseling given   Severe Deconditioning -Meets criteria to get rehab at SNF which she is in agreement with but is awaiting insurance approval -Not appropriate for CIR -PT/OT Recommended SNF initially but now has improved and recommending Home Health however patient unable to go Home with Home Health given her current wound care status and location.  Patient has no caregivers and has tried to encompass home health in the past and patient's wounds have gotten worse so she requires a skilled nursing facility placement even though she worked well and has improved mobility with PT  Sinus Tachycardia, improved -In the setting of Severe Deconditioning and Dehydration -Improved with fluid bolus hydration and IV fluids and beta-blocker..  Telemetry has been discontinued.  -Started low dose Metoprolol 25 mg po BID and increased to 50 mg po BID and will keep at 50 mg po BID given control and may possibly even increase to 75 mg po BID we will hold off for now -Sinus Tachycardia Improved.  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 From using a dirty rag to wipe eye.  Patient now with redness to both eyes likely from tissue wiping both eyes. -Dr. Alfredia Ferguson discussed case with Opthamology Dr. Nancy Fetter who recommended using artificial tears currently for supportive  care and removal of the dirty rag -If is not improving ophthalmology consult in few days however patient is seen ophthalmology in the past and this is not a new issue -Patient thinks eyes slightly improved however still very erythematous and now left eye is also erythematous. -Tobramycin eyedrops have been started we will continue for now to both eyes.   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  Bilateral Knee Warmth and Swelling, stable -Right Knee worse than Left.  Likely secondary to osteoarthritis. -X-Rays were un-revealing and consistent with tricompartmental osteoarthritis. -Continue to Monitor and Up with Therapy  -Pain management.  Hypomagnesemia  -Patient magnesium level was 1.6 and improved to 1.8.  Repeat magnesium level was 2.1 on 07/24/2018. Follow.   DVT prophylaxis: Patient refusing Lovenox. Code Status: Full Family Communication: Updated patient.  No family at bedside. Disposition Plan: Skilled nursing facility when bed available.   Consultants:   Wound care: Cecille Rubin  McNichol, RN 07/06/2018  Procedures:   Plain films of the left knee 07/15/2018  Antimicrobials:   Bactrim 07/07/2018>>>>> 07/14/2018     Subjective: Patient laying in bed.  States increased dose of pain medication improving pain control.  Eyes both red.  No lightheadedness.  No dizziness.  Tolerating current diet.  Laying in bed.  States both eyes and now red.  Objective: Vitals:   07/27/18 1339 07/27/18 2140 07/28/18 0546 07/28/18 0638  BP: 107/66 112/63 (!) 97/51   Pulse: 91 95 91   Resp:  18 16   Temp: 98.1 F (36.7  C) 97.8 F (36.6 C) 97.8 F (36.6 C)   TempSrc: Oral Oral Oral   SpO2: 98% 100% 97%   Weight:    133.9 kg  Height:        Intake/Output Summary (Last 24 hours) at 07/28/2018 1027 Last data filed at 07/27/2018 1800 Gross per 24 hour  Intake 260 ml  Output -  Net 260 ml   Filed Weights   07/26/18 0611 07/27/18 0622 07/28/18 7902  Weight: 134.1 kg 134 kg 133.9 kg    Examination:  General exam: NAD. Respiratory system: Lungs clear to auscultation bilaterally.  No wheezes, no crackles, no rhonchi.  Cardiovascular system: RRR no murmurs rubs or gallops.  No JVD.  No lower extremity edema.   Gastrointestinal system: Abdomen is nontender, nondistended, soft, positive bowel sounds.  Colostomy bag intact.  Abdominal wounds with decreasing drainage.   Central nervous system: Alert and oriented. No focal neurological deficits. Extremities: Symmetric 5 x 5 power. Skin: Extensive full-thickness abdominal wounds as well as in the groin area and folds of the pannus.  Slight drainage.  Psychiatry: Judgement and insight appear normal. Mood & affect appropriate.     Data Reviewed: I have personally reviewed following labs and imaging studies  CBC: Recent Labs  Lab 07/23/18 0555  HGB 10.0*  HCT 40.9*   Basic Metabolic Panel: Recent Labs  Lab 07/23/18 0555 07/24/18 0546  NA 139  --   K 4.8  --   CL 107  --   CO2 24  --   GLUCOSE 85  --   BUN 9  --   CREATININE 0.77  --   CALCIUM 8.8*  --   MG 1.6* 2.1   GFR: Estimated Creatinine Clearance: 125.8 mL/min (by C-G formula based on SCr of 0.77 mg/dL). Liver Function Tests: No results for input(s): AST, ALT, ALKPHOS, BILITOT, PROT, ALBUMIN in the last 168 hours. No results for input(s): LIPASE, AMYLASE in the last 168 hours. No results for input(s): AMMONIA in the last 168 hours. Coagulation Profile: No results for input(s): INR, PROTIME in the last 168 hours. Cardiac Enzymes: No results for input(s): CKTOTAL, CKMB, CKMBINDEX,  TROPONINI in the last 168 hours. BNP (last 3 results) No results for input(s): PROBNP in the last 8760 hours. HbA1C: No results for input(s): HGBA1C in the last 72 hours. CBG: No results for input(s): GLUCAP in the last 168 hours. Lipid Profile: No results for input(s): CHOL, HDL, LDLCALC, TRIG, CHOLHDL, LDLDIRECT in the last 72 hours. Thyroid Function Tests: No results for input(s): TSH, T4TOTAL, FREET4, T3FREE, THYROIDAB in the last 72 hours. Anemia Panel: No results for input(s): VITAMINB12, FOLATE, FERRITIN, TIBC, IRON, RETICCTPCT in the last 72 hours. Sepsis Labs: No results for input(s): PROCALCITON, LATICACIDVEN in the last 168 hours.  No results found for this or any previous visit (from the past 240 hour(s)).  Radiology Studies: No results found.      Scheduled Meds: . feeding supplement (ENSURE ENLIVE)  237 mL Oral Q24H  . ferrous gluconate  324 mg Oral BID WC  . metoprolol tartrate  50 mg Oral BID  . multivitamin with minerals  1 tablet Oral Daily  . protein supplement shake  11 oz Oral Q24H  . sodium chloride flush  3 mL Intravenous Q12H  . tobramycin  1 drop Both Eyes Q4H while awake   Continuous Infusions: . sodium chloride Stopped (07/23/18 1100)     LOS: 22 days    Time spent: 35 minutes    Irine Seal, MD [facility] Pager [phone]  If 7PM-7AM, please contact night-coverage [email or web address] Password [email or web address] 07/28/2018, 10:27 AM

## 2018-07-28 NOTE — Progress Notes (Signed)
Physical Therapy Treatment Patient Details Name: Dominique [name] MRN: [id number] DOB: Dec 03, 1980 Today's Date: 07/28/2018    History of Present Illness 37 y.o. 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 Dx of hypovolemia, sepsis, cellulitis    PT Comments    Pt ambulated 200' without an assistive device, no loss of balance but with mild unsteadiness 2* dizziness/nausea. Pt had vomiting once she returned to her room, RN notified.    Follow Up Recommendations  SNF     Equipment Recommendations  Rolling walker with 5" wheels;3in1 (PT)(bariatric, when ready for home)    Recommendations for Other Services       Precautions / Restrictions Precautions Precautions: Other (comment) Precaution Comments: colostomy; pt denies h/o falls in past 1 year Restrictions Weight Bearing Restrictions: No    Mobility  Bed Mobility               General bed mobility comments: pt up on beside commode  Transfers Overall transfer level: Modified independent Equipment used: None Transfers: Sit to/from Stand Sit to Stand: Modified independent (Device/Increase time)         General transfer comment: stood from bedside commode using armrests without physical assist  Ambulation/Gait Ambulation/Gait assistance: Supervision Gait Distance (Feet): 200 Feet Assistive device: None Gait Pattern/deviations: Step-through pattern;Decreased stride length;Wide base of support     General Gait Details: no device this session, able to ambulate without LOB, demonstrates wide BOS and increased lateral excursion due to body habitus; supervision for safety/balance, pt reported mild nausea while walking then had some vomiting upon return to room, RN notified   Stairs              Wheelchair Mobility    Modified Rankin (Stroke Patients Only)       Balance Overall balance assessment: Needs assistance   Sitting balance-Leahy Scale: Good       Standing balance-Leahy Scale: Good Standing balance comment: standing at sink to wash face, stood in room while NT changed bed linens close S for safety                            Cognition Arousal/Alertness: Awake/alert Behavior During Therapy: WFL for tasks assessed/performed Overall Cognitive Status: Within Functional Limits for tasks assessed                                        Exercises      General Comments        Pertinent Vitals/Pain Pain Score: 6  Pain Location: groin Pain Descriptors / Indicators: Burning Pain Intervention(s): Limited activity within patient's tolerance;Monitored during session;Premedicated before session    Home Living                      Prior Function            PT Goals (current goals can now be found in the care plan section) Acute Rehab PT Goals Patient Stated Goal: to heal wounds, be with her 76 year old daughter, help her dad who is a dialysis pt PT Goal Formulation: With patient Time For Goal Achievement: 08/04/18 Potential to Achieve Goals: Good Progress towards PT goals:  Progressing toward goals    Frequency    Min 2X/week      PT Plan Current plan remains appropriate    Co-evaluation              AM-PAC PT "6 Clicks" Daily Activity  Outcome Measure  Difficulty turning over in bed (including adjusting bedclothes, sheets and blankets)?: A Little Difficulty moving from lying on back to sitting on the side of the bed? : A Little Difficulty sitting down on and standing up from a chair with arms (e.g., wheelchair, bedside commode, etc,.)?: A Little Help needed moving to and from a bed to chair (including a wheelchair)?: A Little Help needed walking in hospital room?: A Little Help needed climbing 3-5  steps with a railing? : A Lot 6 Click Score: 17    End of Session Equipment Utilized During Treatment: Gait belt Activity Tolerance: Treatment limited secondary to medical complications (Comment)(nausea) Patient left: in bed;with call bell/phone within reach Nurse Communication: Mobility status;Other (comment)(pt having N & V) PT Visit Diagnosis: Difficulty in walking, not elsewhere classified (R26.2)     Time: 9971-8209 PT Time Calculation (min) (ACUTE ONLY): 21 min  Charges:  $Gait Training: 8-22 mins                    Blondell Reveal Kistler PT 07/28/2018  Acute Rehabilitation Services Pager [phone] Office [phone]

## 2018-07-28 NOTE — Progress Notes (Addendum)
CSW following to assist with discharge planning to SNF.  CSW aware of prior plan for [facility] to meet with patient for assessment (from 10/31). CSW attempted to reach Bonnell Public, intake coordinator at [facility] to inquire as to when an assessment would be completed per SW AD request. CSW left a detailed voicemail for [employer] requesting a returned call.   [facility] [phone])     11:35am  Received call back from [facility]. A representative from [facility] will come to assess the patient in person on Friday, 07/31/18.  Stephanie Acre, LCSW-A Clinical Social Worker

## 2018-07-28 NOTE — Progress Notes (Signed)
Occupational Therapy Treatment and goal update Patient Details Name: Dominique [name] MRN: [id number] DOB: Feb 21, 1981 Today's Date: 07/28/2018    History of present illness 37 y.o. 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 Dx of hypovolemia, sepsis, cellulitis   OT comments  Pt is making good gains.  Excess sway and tends to furniture walk; declined RW during OT session  Follow Up Recommendations  SNF    Equipment Recommendations  3 in 1 bedside commode(wide)    Recommendations for Other Services      Precautions / Restrictions Precautions Precautions: Other (comment) Precaution Comments: colostomy; pt denies h/o falls in past 1 year Restrictions Weight Bearing Restrictions: No       Mobility Bed Mobility         Supine to sit: Supervision Sit to supine: Min guard   General bed mobility comments: pt gets back onto bed with momentum  Transfers Overall transfer level: Modified independent Equipment used: None Transfers: Sit to/from Stand Sit to Stand: Modified independent (Device/Increase time)         General transfer comment: stood from bedside commode using armrests without physical assist    Balance Overall balance assessment: Needs assistance   Sitting balance-Leahy Scale: Good       Standing balance-Leahy Scale: Good Standing balance comment: standing at sink to wash face, stood in room while NT changed bed linens close S for safety                           ADL either performed or assessed with clinical judgement   ADL       Grooming: Oral care;Supervision/safety;Set up;Standing               Lower Body Dressing: (doffed socks at bed level)   Toilet Transfer: Supervision/safety;Ambulation;Min guard(back to  bed)             General ADL Comments: ambulated around bed with min guard to sink.  Returned to bed after brushing teeth. Pt tends to hold on to bedrails when walking and has sway but no LOB.     Vision       Perception     Praxis      Cognition Arousal/Alertness: Awake/alert Behavior During Therapy: WFL for tasks assessed/performed Overall Cognitive Status: Within Functional Limits for tasks assessed                                          Exercises     Shoulder Instructions       General Comments      Pertinent Vitals/ Pain       Pain Score: 7  Pain Location: groin Pain Descriptors / Indicators: Aching Pain Intervention(s): Limited activity within patient's tolerance;Monitored during session;Repositioned  Home Living                                          Prior Functioning/Environment              Frequency  Min 2X/week        Progress Toward Goals  OT Goals(current goals can now be found in the care plan section)  Progress towards OT goals: Progressing toward goals;Goals met and updated - see care plan  Acute Rehab OT Goals Patient Stated Goal: to heal wounds, be with her 64 year old daughter, help her dad who is a dialysis pt ADL Goals Pt Will Perform Toileting - Clothing Manipulation and hygiene: with min assist;sit to/from stand(for throughness) Additional ADL Goal #1: pt will gather clothes at mod I level  Plan      Co-evaluation                 AM-PAC PT "6 Clicks" Daily Activity     Outcome Measure   Help from another person eating meals?: None Help from another person taking care of personal grooming?: A Little Help from another person toileting, which includes using toliet, bedpan, or urinal?: A Little Help from another person bathing (including washing, rinsing, drying)?: A Little Help from another person to put on and taking off regular upper body clothing?: A Little Help from  another person to put on and taking off regular lower body clothing?: A Little 6 Click Score: 19    End of Session Equipment Utilized During Treatment: (pt declined RW; min guard for safety)  OT Visit Diagnosis: Unsteadiness on feet (R26.81);Muscle weakness (generalized) (M62.81)   Activity Tolerance Patient tolerated treatment well   Patient Left in bed;with call bell/phone within reach   Nurse Communication          Time: 7544-9201 OT Time Calculation (min): 17 min  Charges: OT General Charges $OT Visit: 1 Visit OT Treatments $Self Care/Home Management : 8-22 mins  [name], OTR/L Acute Rehabilitation Services [phone] [place] pager [phone] office 07/28/2018   Lamanda Rudder 07/28/2018, 2:01 PM

## 2018-07-29 NOTE — Progress Notes (Addendum)
Pt asked x2 so far this shift if she is ready to shower. Pt states she wants to wait until "about 1:00" and goes back to sleep. Pt made aware that RN could have discharges or admissions at that time which would delay her dressing changes being done- RN reminded pt the sooner the better for shower/dressing changes. Pt also continues to refuse her eye drops stating "I'll do it later." Bilateral eyes are very red and irritated, pt does not keep them open long.

## 2018-07-29 NOTE — Progress Notes (Signed)
Pt took a shower around 1800 and then wanted to eat supper. Unable to do dressing change at this time d/t pt request.

## 2018-07-29 NOTE — Progress Notes (Signed)
Pt. Agreed upon dressing changes: midline, bilateral groins and interfolds in anterior abdomen area. Pt. Refused dressing changes in posterior interfolds and sacrum. Pt. Educated on importance of dressing changes and wound healing. Will continue to monitor closely

## 2018-07-29 NOTE — Progress Notes (Signed)
[facility] PROGRESS NOTE    Progress Note  Dominique [name]  RFF:[id number] DOB: 07/07/1981 DOA: 07/04/2018 PCP: Benito Mccreedy, MD     Brief Narrative:   Dominique [name] is an 37 y.o. female past medical history significant for morbid obesity, obstructive sleep apnea, Crohn's status post perforation with a colostomy, with a chronic abdominal wound who was in her usual state of health until she recently developed recurrent nausea and vomiting for several days with lightheadedness  Assessment/Plan:   Hypovolemia/ Orthostatic hypotension Resolved with aggressive hydration her symptoms resolved. Orthostatics status have resolved.  Hypovolemic hyponatremia: Resolved with IV fluid hydration.  Anemia due to acute blood loss/iron deficiency anemia: Likely due to intermittent bleeding from her wound site. Iron level was 14 ferritin was 15 her hemoglobin dropped from 9.4-7.9. She status post 1 unit of packed red blood cells on 07/09/2018, and IV iron. Oral iron supplements. Her hemoglobin has remained stable.    Metabolic acidosis/acute kidney injury: Likely prerenal azotemia due to with IV fluids and IV bicarbonate.  Crohn's colitis with perforation s/p left colectomy/colostomy 2016: Cont. monitors to output as this may be contributing to her hypovolemia.  Severe wounds with cellulitis/sepsis: He has chronic wounds in the abdomen from prior surgery and new wounds due to moisture damage She has completed her course of antibiotics. Wound care was consulted they recommended inter-dry.  Sepsis physiology has resolved.  Indwelling urinary catheter: Discontinued 07/15/2018.  Non-severe protein caloric malnutrition: Nutrition has been consulted they recommended Premier protein  Obstructive sleep apnea: Continue CPAP at night.  Morbid obesity with a BMI of 54: Counselin was performed.  Severe deconditioning: Physical therapy evaluated the patient amended skilled nursing  facility, awaiting placement. Not appropriate for CIR.  Sinus tachycardia: The setting of dehydration improved with fluid bolus and beta-blocker.  Right eye erythema and swelling with right eye blurred vision now improved: Dr. Shelton Silvas discussed with ophthalmology who recommended to use artificial tears. Is had significant improvement in her erythema. Continue follow-up with ophthalmology as an outpatient.  Thrombocytosis: Likely reactive now resolved.  Bilateral knee warmth and swelling: [place] secondary to osteoarthritis, and x-ray showed tricompartmental osteoarthritis continue physical therapy and follow-up with orthopedics as an outpatient.  Hypomagnesemia: Repleted orally now continue to follow.  OSA on CPAP      DVT prophylaxis: lovenox Family Communication:none Disposition Plan/Barrier to D/C: awaiting on placement Code Status:     Code Status Orders  (From admission, onward)         Start     Ordered   07/04/18 2335  Full code  Continuous     07/04/18 2337        Code Status History    Date Active Date Inactive Code Status Order ID Comments User Context   05/30/2018 1310 06/03/2018 1814 Full Code [id number]  Alma Friendly, MD Inpatient   04/19/2018 1247 04/23/2018 1912 Full Code [id number]  Debbe Odea, MD ED   01/06/2017 0612 01/07/2017 0325 Full Code [id number]  Norval Morton, MD ED   12/25/2016 1257 12/29/2016 1749 Full Code [id number]  Debbe Odea, MD Inpatient   12/25/2016 0910 12/25/2016 1257 Full Code [id number]  Debbe Odea, MD ED   07/21/2015 0935 07/25/2015 2218 Full Code [id number]  Samella Parr, NP Inpatient   06/26/2015 1759 07/21/2015 0935 Full Code [id number]  Cathlyn Parsons, PA-C Inpatient   06/04/2015 0757 06/26/2015 1759 Full Code [id number]  Gennaro Africa, MD Inpatient   11/16/2014 2238 11/23/2014 1747 Full Code [id number]  Posey Pronto,  Diamantina Providence, MD ED        IV Access:    Peripheral IV   Procedures and diagnostic studies:   No results  found.   Medical Consultants:    None.  Anti-Infectives:   none  Subjective:    Dominique [name] she relates no new complaints.  Objective:    Vitals:   07/28/18 1359 07/28/18 2111 07/29/18 0434 07/29/18 0500  BP: (!) 100/55 108/70 (!) 108/52   Pulse: 83 86 77   Resp: 16 14 12    Temp: 98.6 F (37 C) 98.5 F (36.9 C) 98.4 F (36.9 C)   TempSrc: Oral Oral Oral   SpO2: 95% 95% 95%   Weight:    134 kg  Height:        Intake/Output Summary (Last 24 hours) at 07/29/2018 0926 Last data filed at 07/29/2018 0807 Gross per 24 hour  Intake 1022 ml  Output 200 ml  Net 822 ml   Filed Weights   07/27/18 0622 07/28/18 0638 07/29/18 0500  Weight: 134 kg 133.9 kg 134 kg    Exam: General exam: In no acute distress. Respiratory system: Good air movement and clear to auscultation. Cardiovascular system: S1 & S2 heard, RRR. Gastrointestinal system: Abdomen is nondistended, soft and nontender.  Central nervous system: Alert and oriented. No focal neurological deficits. Extremities: No pedal edema. Skin: No rashes, lesions or ulcers Psychiatry: Judgement and insight appear normal. Mood & affect appropriate.    Data Reviewed:    Labs: Basic Metabolic Panel: Recent Labs  Lab 07/23/18 0555 07/24/18 0546  NA 139  --   K 4.8  --   CL 107  --   CO2 24  --   GLUCOSE 85  --   BUN 9  --   CREATININE 0.77  --   CALCIUM 8.8*  --   MG 1.6* 2.1   GFR Estimated Creatinine Clearance: 126 mL/min (by C-G formula based on SCr of 0.77 mg/dL). Liver Function Tests: No results for input(s): AST, ALT, ALKPHOS, BILITOT, PROT, ALBUMIN in the last 168 hours. No results for input(s): LIPASE, AMYLASE in the last 168 hours. No results for input(s): AMMONIA in the last 168 hours. Coagulation profile No results for input(s): INR, PROTIME in the last 168 hours.  CBC: Recent Labs  Lab 07/23/18 0555  HGB 10.0*  HCT 34.4*   Cardiac Enzymes: No results for input(s): CKTOTAL, CKMB,  CKMBINDEX, TROPONINI in the last 168 hours. BNP (last 3 results) No results for input(s): PROBNP in the last 8760 hours. CBG: No results for input(s): GLUCAP in the last 168 hours. D-Dimer: No results for input(s): DDIMER in the last 72 hours. Hgb A1c: No results for input(s): HGBA1C in the last 72 hours. Lipid Profile: No results for input(s): CHOL, HDL, LDLCALC, TRIG, CHOLHDL, LDLDIRECT in the last 72 hours. Thyroid function studies: No results for input(s): TSH, T4TOTAL, T3FREE, THYROIDAB in the last 72 hours.  Invalid input(s): FREET3 Anemia work up: No results for input(s): VITAMINB12, FOLATE, FERRITIN, TIBC, IRON, RETICCTPCT in the last 72 hours. Sepsis Labs: No results for input(s): PROCALCITON, WBC, LATICACIDVEN in the last 168 hours. Microbiology No results found for this or any previous visit (from the past 240 hour(s)).   Medications:   . feeding supplement (ENSURE ENLIVE)  237 mL Oral Q24H  . ferrous gluconate  324 mg Oral BID WC  . metoprolol tartrate  50 mg Oral BID  . multivitamin with minerals  1 tablet Oral Daily  . naphazoline-glycerin  1 drop Both Eyes TID WC & HS  . protein supplement shake  11 oz Oral Q24H  . sodium chloride flush  3 mL Intravenous Q12H  . tobramycin  1 drop Both Eyes Q4H while awake   Continuous Infusions: . sodium chloride Stopped (07/23/18 1100)      LOS: 23 days   Charlynne Cousins  [facility] Pager [phone]  *Please refer to [email or web address], password [email or web address] to get updated schedule on who will round on this patient, as hospitalists switch teams weekly. If 7PM-7AM, please contact night-coverage at [email or web address], password [email or web address] for any overnight needs.  07/29/2018, 9:26 AM

## 2018-07-29 NOTE — Progress Notes (Signed)
This RN attempted to do dressing changes for this shift x2. Pt. Delayed both times, requesting perform dressing change at 2200 when PRN pain medicine can be administered. Will attempt at 2200 to change dressings. Will continue to monitor pt. Closely.

## 2018-07-30 MED ORDER — MAGNESIUM OXIDE 400 (241.3 MG) MG PO TABS
200.0000 mg | ORAL_TABLET | Freq: Every day | ORAL | Status: AC
  Administered 2018-07-30: 200 mg via ORAL
  Filled 2018-07-30: qty 1

## 2018-07-30 NOTE — Progress Notes (Signed)
Nutrition Follow-up  DOCUMENTATION CODES:   Non-severe (moderate) malnutrition in context of chronic illness, Morbid obesity  INTERVENTION:  - Will d/c Ensure Enlive and Premier Protein orders as patient refuses to consume these supplements. - Will continue Magic Cup once/day. - Patient has tried and refused all other ONS available.  - Continue to encourage PO intakes.    NUTRITION DIAGNOSIS:   Moderate Malnutrition related to chronic illness as evidenced by energy intake < or equal to 75% for > or equal to 1 month, percent weight loss. -ongoing  GOAL:   Patient will meet greater than or equal to 90% of their needs -unmet  MONITOR:   PO intake, Supplement acceptance, Weight trends, Labs, Skin  ASSESSMENT:   37 y.o. female past medical history significant for obesity, OSA, Crohn's disease s/p perforation with colostomy, chronic wound. She was in her usual health state of health until she developed recurrent nausea and vomiting several days ago, she is not lightheaded. Patient admitted with diagnosis of hypovolemia.   Weight has been mainly stable since 10/28. Per review, last documented intakes are 70% of breakfast and 80% of dinner on 11/4; 0% of breakfast and lunch and 100% of dinner on 11/5. Patient continues with mainly 1-2 meals/day and as reported on initial assessment and since that time, dinner is the meal that she most consistently eats and at which she has the greatest meal completion.   Per Dr. Barbra Sarks note yesterday: hypovolemia and hypotension resolved, anemia d/t acute blood loss anemia--hemoglobin has been stable, metabolic acidosis and AKI, Crohn's colitis, severe wounds with cellulitis, sepsis--resolved, severe deconditioning. Note indicates that patient is awaiting placement.    Medications reviewed; daily multivitamin with minerals. Labs reviewed.      Diet Order:   Diet Order            Diet - low sodium heart healthy        Diet Heart Room service  appropriate? Yes; Fluid consistency: Thin  Diet effective now              EDUCATION NEEDS:   Not appropriate for education at this time  Skin:  Skin Assessment: Skin Integrity Issues: Skin Integrity Issues:: Other (Comment) Other: per flowsheet, non-pressure wounds to: abdomen, upper and lower abdomen, L breast, bilateral groins, sacrum, and bilateral lower back.  Last BM:  11/6  Height:   Ht Readings from Last 1 Encounters:  07/04/18 5' 2"  (1.575 m)    Weight:   Wt Readings from Last 1 Encounters:  07/30/18 133.9 kg    Ideal Body Weight:  50 kg  BMI:  Body mass index is 53.99 kg/m.  Estimated Nutritional Needs:   Kcal:  2230-2510 (16-18 kcal/kg)  Protein:  135-145 grams  Fluid:  >/= 2.3 L/day     Jarome Matin, MS, RD, LDN, [facility] Inpatient Clinical Dietitian Pager # [phone] After hours/weekend pager # [phone]

## 2018-07-30 NOTE — Progress Notes (Signed)
[facility] PROGRESS NOTE    Progress Note  Dominique [name]  JEH:[id number] DOB: [birth year] DOA: 07/04/2018 PCP: Benito Mccreedy, MD     Brief Narrative:   Dominique [name] is an 37 y.o. female past medical history significant for morbid obesity, obstructive sleep apnea, Crohn's status post perforation with a colostomy, with a chronic abdominal wound who was in her usual state of health until she recently developed recurrent nausea and vomiting for several days with lightheadedness  Assessment/Plan:   Hypovolemia/ Orthostatic hypotension Resolved with aggressive hydration her symptoms resolved. Orthostatics status have resolved.  Hypovolemic hyponatremia: Resolved with IV fluid hydration.  Anemia due to acute blood loss/iron deficiency anemia: Likely due to intermittent bleeding from her wound site. Iron level was 14 ferritin was 15 her hemoglobin dropped from 9.4-7.9. She status post 1 unit of packed red blood cells on 07/09/2018, and IV iron. Oral iron supplements. Her hemoglobin has remained stable.    Metabolic acidosis/acute kidney injury: Likely prerenal azotemia due to with IV fluids and IV bicarbonate.  Crohn's colitis with perforation s/p left colectomy/colostomy 2016: Cont. monitors to output as this may be contributing to her hypovolemia.  Severe wounds with cellulitis/sepsis: He has chronic wounds in the abdomen from prior surgery and new wounds due to moisture damage She has completed her course of antibiotics. Wound care was consulted they recommended inter-dry.  Sepsis physiology has resolved. Patient is awaiting skilled nursing facility placement as she cannot take care of herself at home.  Indwelling urinary catheter: Discontinued 07/15/2018.  Non-severe protein caloric malnutrition: Nutrition has been consulted they recommended Premier protein  Obstructive sleep apnea: Continue CPAP at night.  Morbid obesity with a BMI of 54: Counselin was  performed.  Severe deconditioning: Physical therapy evaluated the patient amended skilled nursing facility, awaiting placement. Not appropriate for CIR.  Sinus tachycardia: The setting of dehydration improved with fluid bolus and beta-blocker.  Right eye erythema and swelling with right eye blurred vision now improved: Dr. Shelton Silvas discussed with ophthalmology who recommended to use artificial tears. Is had significant improvement in her erythema. Continue follow-up with ophthalmology as an outpatient.  Thrombocytosis: Likely reactive now resolved.  Bilateral knee warmth and swelling: [place] secondary to osteoarthritis, and x-ray showed tricompartmental osteoarthritis continue physical therapy and follow-up with orthopedics as an outpatient.  Hypomagnesemia: Magnesium today is 2.1 continue mag 200 mg daily as an outpatient.  OSA on CPAP      DVT prophylaxis: lovenox Family Communication:none Disposition Plan/Barrier to D/C: awaiting on placement Code Status:     Code Status Orders  (From admission, onward)         Start     Ordered   07/04/18 2335  Full code  Continuous     07/04/18 2337        Code Status History    Date Active Date Inactive Code Status Order ID Comments User Context   05/30/2018 1310 06/03/2018 1814 Full Code [id number]  Alma Friendly, MD Inpatient   04/19/2018 1247 04/23/2018 1912 Full Code [id number]  Debbe Odea, MD ED   01/06/2017 0612 01/07/2017 0325 Full Code [id number]  Norval Morton, MD ED   12/25/2016 1257 12/29/2016 1749 Full Code [id number]  Debbe Odea, MD Inpatient   12/25/2016 0910 12/25/2016 1257 Full Code [id number]  Debbe Odea, MD ED   07/21/2015 0935 07/25/2015 2218 Full Code [id number]  Samella Parr, NP Inpatient   06/26/2015 1759 07/21/2015 0935 Full Code [id number]  Cathlyn Parsons, PA-C Inpatient   06/04/2015  7124 06/26/2015 1759 Full Code [id number]  Gennaro Africa, MD Inpatient   11/16/2014 2238 11/23/2014 1747 Full Code [id number]   Berle Mull, MD ED        IV Access:    Peripheral IV   Procedures and diagnostic studies:   No results found.   Medical Consultants:    None.  Anti-Infectives:   none  Subjective:    Dominique [name] she relates no new complaints.  Objective:    Vitals:   07/29/18 1359 07/29/18 2040 07/30/18 0631 07/30/18 0639  BP: (!) 98/54 126/65 (!) 104/53   Pulse: 81 97 88   Resp: 16 18 20    Temp: 98.1 F (36.7 C) 98.1 F (36.7 C) 98 F (36.7 C)   TempSrc: Oral Oral Oral   SpO2: 97% 100% 98%   Weight:    133.9 kg  Height:        Intake/Output Summary (Last 24 hours) at 07/30/2018 0903 Last data filed at 07/30/2018 0636 Gross per 24 hour  Intake 220 ml  Output 100 ml  Net 120 ml   Filed Weights   07/28/18 0638 07/29/18 0500 07/30/18 0639  Weight: 133.9 kg 134 kg 133.9 kg    Exam: General exam: In no acute distress. Respiratory system: Good air movement and clear to auscultation. Cardiovascular system: S1 & S2 heard, RRR. Gastrointestinal system: Abdomen is nondistended, soft and nontender.  Central nervous system: Alert and oriented. No focal neurological deficits. Extremities: No pedal edema. Skin: No rashes, lesions or ulcers Psychiatry: Judgement and insight appear normal. Mood & affect appropriate.    Data Reviewed:    Labs: Basic Metabolic Panel: Recent Labs  Lab 07/24/18 0546  MG 2.1   GFR Estimated Creatinine Clearance: 125.8 mL/min (by C-G formula based on SCr of 0.77 mg/dL). Liver Function Tests: No results for input(s): AST, ALT, ALKPHOS, BILITOT, PROT, ALBUMIN in the last 168 hours. No results for input(s): LIPASE, AMYLASE in the last 168 hours. No results for input(s): AMMONIA in the last 168 hours. Coagulation profile No results for input(s): INR, PROTIME in the last 168 hours.  CBC: No results for input(s): WBC, NEUTROABS, HGB, HCT, MCV, PLT in the last 168 hours. Cardiac Enzymes: No results for input(s): CKTOTAL, CKMB,  CKMBINDEX, TROPONINI in the last 168 hours. BNP (last 3 results) No results for input(s): PROBNP in the last 8760 hours. CBG: No results for input(s): GLUCAP in the last 168 hours. D-Dimer: No results for input(s): DDIMER in the last 72 hours. Hgb A1c: No results for input(s): HGBA1C in the last 72 hours. Lipid Profile: No results for input(s): CHOL, HDL, LDLCALC, TRIG, CHOLHDL, LDLDIRECT in the last 72 hours. Thyroid function studies: No results for input(s): TSH, T4TOTAL, T3FREE, THYROIDAB in the last 72 hours.  Invalid input(s): FREET3 Anemia work up: No results for input(s): VITAMINB12, FOLATE, FERRITIN, TIBC, IRON, RETICCTPCT in the last 72 hours. Sepsis Labs: No results for input(s): PROCALCITON, WBC, LATICACIDVEN in the last 168 hours. Microbiology No results found for this or any previous visit (from the past 240 hour(s)).   Medications:   . feeding supplement (ENSURE ENLIVE)  237 mL Oral Q24H  . ferrous gluconate  324 mg Oral BID WC  . metoprolol tartrate  50 mg Oral BID  . multivitamin with minerals  1 tablet Oral Daily  . naphazoline-glycerin  1 drop Both Eyes TID WC & HS  . protein supplement shake  11 oz Oral Q24H  . sodium chloride flush  3 mL Intravenous  Q12H   Continuous Infusions: . sodium chloride Stopped (07/23/18 1100)      LOS: 24 days   Charlynne Cousins  [facility] Pager [phone]  *Please refer to [email or web address], password [email or web address] to get updated schedule on who will round on this patient, as hospitalists switch teams weekly. If 7PM-7AM, please contact night-coverage at [email or web address], password [email or web address] for any overnight needs.  07/30/2018, 9:03 AM

## 2018-07-31 MED ORDER — FERROUS GLUCONATE 324 (38 FE) MG PO TABS: 324.0000 mg | ORAL_TABLET | Freq: Two times a day (BID) | ORAL | 3 refills | Status: AC

## 2018-07-31 MED ORDER — METOPROLOL TARTRATE 50 MG PO TABS: 50.0000 mg | ORAL_TABLET | Freq: Two times a day (BID) | ORAL | Status: DC

## 2018-07-31 NOTE — Discharge Summary (Signed)
Physician Discharge Summary  Dominique [name] AST:[id number] DOB: [birth year] DOA: 07/04/2018  PCP: Benito Mccreedy, MD  Admit date: 07/04/2018 Discharge date: 07/31/2018  Admitted From: home Disposition:  SNF  Recommendations for Outpatient Follow-up:  1. Follow up with PCP in 1-2 weeks 2. Continue oral sulfate.  Home Health:No Equipment/Devices:None  Discharge Condition:stable  CODE STATUS:full Diet recommendation: Heart Healthy    Brief/Interim Summary: 37 y.o.femalepast medical history significant for obesity obstructive sleep apnea Crohn's, status post perforation with colostomy chronic wounds and abdomen and bilateral groin, who was in his usual health until she developed recurrent nausea and vomiting several days ago. She presented to the ED for lightheaded sensation when she was sitting or standing up. HR noted to be 140s, sinus tachycardia.   Discharge Diagnoses:  Principal Problem:   Hypovolemia Active Problems:   Orthostatic hypotension   Crohn's colitis with perforation s/p left colectomy/colostomy 2016   OSA on CPAP   Chronic anemia   Multiple wounds of skin   Mild renal insufficiency   Hyponatremia   Malnutrition of moderate degree   Dehydration Hypovolemia/orthostatic hypotension: Resolved with aggressive hydration.  Hypovolemic hyponatremia: Resolved with IV fluid hydration.  Anemia due to blood loss/iron deficiency anemia: Likely due to intermittent bleeding from her wound sites. She was given IV iron she will go home on oral iron supplements.  Metabolic acidosis/acute kidney injury: Likely prerenal azotemia resolved with IV fluids.  History of Crohn's colitis with perforation status post left colectomy in 2016: Continue to monitor output through her stoma.  Severe wound cellulitis/sepsis: These are chronic wounds from prior surgical interventions and new wounds due to moisture damage.  She completed her course of antibiotics in house. Wound care  was consulted and they recommended into dry.  Indwelling Foley catheter: Discontinued 07/15/2018 patient passed her voiding trial.  Severe protein caloric malnutrition: Nutrition was consulted they recommended Premier protein which was to continue as an outpatient.  Obstructive sleep apnea: Continue CPAP at night.  Severe deconditioning: Physical therapy evaluated the patient the recommended skilled nursing facility as she cannot take care of her wounds at home due to her body habitus.  Sinus tachycardia: Likely due to dehydration resolved continue metoprolol.  Right eye erythema and swelling with blurry vision: Dr. Shelton Silvas spoke with the ophthalmologist who recommended artificial tears.  Thrombocytosis: Likely reactive resolved.  Bilateral knee warmth and swelling: Likely due to osteoarthritis is seen on x-ray that show tricompartment osteoarthritis. Continue physical therapy as an outpatient.   Discharge Instructions  Discharge Instructions    Diet - low sodium heart healthy   Complete by:  As directed    Diet - low sodium heart healthy   Complete by:  As directed    Increase activity slowly   Complete by:  As directed    Increase activity slowly   Complete by:  As directed      Allergies as of 07/31/2018      Reactions   Other Shortness Of Breath, Swelling   Tree nuts   Penicillins Other (See Comments)   Unknown childhood allergy Has patient had a PCN reaction causing immediate rash, facial/tongue/throat swelling, SOB or lightheadedness with hypotension: Unknown Has patient had a PCN reaction causing severe rash involving mucus membranes or skin necrosis: Unknown Has patient had a PCN reaction that required hospitalization: Unknown Has patient had a PCN reaction occurring within the last 10 years: Unknown If all of the above answers are "NO", then may proceed with Cephalosporin use.  Medication List    STOP taking these medications   ferrous sulfate 325  (65 FE) MG tablet   Gerhardt's butt cream Crea   tiZANidine 4 MG tablet Commonly known as:  ZANAFLEX     TAKE these medications   acetaminophen 500 MG tablet Commonly known as:  TYLENOL Take 500 mg by mouth every 6 (six) hours as needed for mild pain, moderate pain or headache.   albuterol (2.5 MG/3ML) 0.083% nebulizer solution Commonly known as:  PROVENTIL Take 3 mLs (2.5 mg total) by nebulization 2 (two) times daily as needed for wheezing.   bisacodyl 5 MG EC tablet Commonly known as:  DULCOLAX Take 1 tablet (5 mg total) by mouth daily as needed for moderate constipation.   nutrition supplement (JUVEN) Pack Take 1 packet by mouth 2 (two) times daily between meals.   feeding supplement (ENSURE ENLIVE) Liqd Take 237 mLs by mouth daily.   feeding supplement (PRO-STAT SUGAR FREE 64) Liqd Take 30 mLs by mouth daily.   ferrous gluconate 324 MG tablet Commonly known as:  FERGON Take 1 tablet (324 mg total) by mouth 2 (two) times daily with a meal.   metoprolol tartrate 50 MG tablet Commonly known as:  LOPRESSOR Take 1 tablet (50 mg total) by mouth 2 (two) times daily. What changed:    medication strength  how much to take   multivitamin with minerals Tabs tablet Take 1 tablet by mouth daily.   oxyCODONE 5 MG immediate release tablet Commonly known as:  Oxy IR/ROXICODONE Take 1-2 tablets (5-10 mg total) by mouth every 4 (four) hours as needed for moderate pain (5 mg for moderate pain and 10 mg for severe pain).   polyethylene glycol packet Commonly known as:  MIRALAX / GLYCOLAX Take 17 g by mouth daily as needed for mild constipation.   sulfamethoxazole-trimethoprim 400-80 MG tablet Commonly known as:  BACTRIM,SEPTRA Take 1 tablet by mouth 2 (two) times daily.   tobramycin 0.3 % ophthalmic solution Commonly known as:  TOBREX Place 2 drops into both eyes every 6 (six) hours.       Allergies  Allergen Reactions  . Other Shortness Of Breath and Swelling     Tree nuts  . Penicillins Other (See Comments)    Unknown childhood allergy Has patient had a PCN reaction causing immediate rash, facial/tongue/throat swelling, SOB or lightheadedness with hypotension: Unknown Has patient had a PCN reaction causing severe rash involving mucus membranes or skin necrosis: Unknown Has patient had a PCN reaction that required hospitalization: Unknown Has patient had a PCN reaction occurring within the last 10 years: Unknown If all of the above answers are "NO", then may proceed with Cephalosporin use.     Consultations:  none   Procedures/Studies: Dg Knee 1-2 Views Left  Result Date: 07/15/2018 CLINICAL DATA:  37 year old female with a history of both knees warm to the touch EXAM: LEFT KNEE - 1-2 VIEW COMPARISON:  None. FINDINGS: No acute displaced fracture. No erosive changes. Medial greater than lateral joint space narrowing with marginal osteophyte formation. Degenerative changes at the patellofemoral joint. No joint effusion. IMPRESSION: Negative for acute bony abnormality. No evidence of joint effusion. Tricompartmental osteoarthritis. Electronically Signed   By: Corrie Mckusick D.O.   On: 07/15/2018 13:17   Dg Knee 1-2 Views Right  Result Date: 07/15/2018 CLINICAL DATA:  37 year old female with a history of knees warm to the touch EXAM: RIGHT KNEE - 1-2 VIEW COMPARISON:  None. FINDINGS: No acute displaced fracture. No erosive  changes. Medial greater than lateral joint space narrowing. Marginal osteophyte formation. No evidence of joint effusion. Degenerative changes of the patellofemoral joint. No radiopaque foreign body. IMPRESSION: No acute bony abnormality. No joint effusion.  Tricompartmental osteoarthritis. Electronically Signed   By: Corrie Mckusick D.O.   On: 07/15/2018 13:20   [place] Ekg Site Rite  Result Date: 07/24/2018 If Site Rite image not attached, placement could not be confirmed due to current cardiac rhythm.   (Echo, Carotid, EGD,  Colonoscopy, ERCP)    Subjective: Complaints ready to go to SNF Discharge Exam: Vitals:   07/30/18 2137 07/31/18 0624  BP: (!) 113/49 (!) 108/56  Pulse: 82 75  Resp:  18  Temp:  98.1 F (36.7 C)  SpO2:  93%   Vitals:   07/30/18 2135 07/30/18 2137 07/31/18 0500 07/31/18 0624  BP: (!) 113/49 (!) 113/49  (!) 108/56  Pulse: 83 82  75  Resp:    18  Temp:    98.1 F (36.7 C)  TempSrc:    Oral  SpO2:    93%  Weight:   133.9 kg   Height:        General: Pt is alert, awake, not in acute distress Cardiovascular: RRR, S1/S2 +, no rubs, no gallops Respiratory: CTA bilaterally, no wheezing, no rhonchi Abdominal: Soft, NT, ND, bowel sounds + Extremities: no edema, no cyanosis    The results of significant diagnostics from this hospitalization (including imaging, microbiology, ancillary and laboratory) are listed below for reference.     Microbiology: No results found for this or any previous visit (from the past 240 hour(s)).   Labs: BNP (last 3 results) No results for input(s): BNP in the last 8760 hours. Basic Metabolic Panel: No results for input(s): NA, K, CL, CO2, GLUCOSE, BUN, CREATININE, CALCIUM, MG, PHOS in the last 168 hours. Liver Function Tests: No results for input(s): AST, ALT, ALKPHOS, BILITOT, PROT, ALBUMIN in the last 168 hours. No results for input(s): LIPASE, AMYLASE in the last 168 hours. No results for input(s): AMMONIA in the last 168 hours. CBC: No results for input(s): WBC, NEUTROABS, HGB, HCT, MCV, PLT in the last 168 hours. Cardiac Enzymes: No results for input(s): CKTOTAL, CKMB, CKMBINDEX, TROPONINI in the last 168 hours. BNP: Invalid input(s): POCBNP CBG: No results for input(s): GLUCAP in the last 168 hours. D-Dimer No results for input(s): DDIMER in the last 72 hours. Hgb A1c No results for input(s): HGBA1C in the last 72 hours. Lipid Profile No results for input(s): CHOL, HDL, LDLCALC, TRIG, CHOLHDL, LDLDIRECT in the last 72  hours. Thyroid function studies No results for input(s): TSH, T4TOTAL, T3FREE, THYROIDAB in the last 72 hours.  Invalid input(s): FREET3 Anemia work up No results for input(s): VITAMINB12, FOLATE, FERRITIN, TIBC, IRON, RETICCTPCT in the last 72 hours. Urinalysis    Component Value Date/Time   COLORURINE YELLOW 07/05/2018 0511   APPEARANCEUR CLEAR 07/05/2018 0511   LABSPEC 1.020 07/05/2018 0511   PHURINE 5.0 07/05/2018 0511   GLUCOSEU NEGATIVE 07/05/2018 0511   HGBUR NEGATIVE 07/05/2018 0511   BILIRUBINUR NEGATIVE 07/05/2018 0511   KETONESUR 5 (A) 07/05/2018 0511   PROTEINUR 30 (A) 07/05/2018 0511   UROBILINOGEN 0.2 07/22/2015 0405   NITRITE NEGATIVE 07/05/2018 0511   LEUKOCYTESUR NEGATIVE 07/05/2018 0511   Sepsis Labs Invalid input(s): PROCALCITONIN,  WBC,  LACTICIDVEN Microbiology No results found for this or any previous visit (from the past 240 hour(s)).   Time coordinating discharge: 40 minutes  SIGNED:   Bess Harvest  Olevia Bowens, MD  [facility] 07/31/2018, 8:31 AM Pager   If 7PM-7AM, please contact night-coverage [email or web address] Password [email or web address]

## 2018-07-31 NOTE — Progress Notes (Signed)
Previous RN reported that dressing changes were not performed for 11/7. This RN attempted to have pt. Take a shower then perform daily dressing change. Pt. Refused and stated "I will later tonight" Will attempt again later in the night. Will continue to monitor pt. Closely.

## 2018-07-31 NOTE — Progress Notes (Signed)
Clinical Social Worker following patient for support and discharge needs. Patient was seen by Patience  from [facility] (contact# [phone]). Patience stated they will be able to take patient at facility but wont be able to take her today. Patience stated the facility will most likely be able to take patient next week.  Rhea Pink, MSW,  [place]

## 2018-07-31 NOTE — Progress Notes (Signed)
Attempted to have patient bathe and perform dressing changes x3. Patient became emotional and started crying. Educated on importance of daily dressing changes. Unable to console pt. Will make oncoming RN aware of refusal of dressing changes. Will continue to monitor.

## 2018-07-31 NOTE — Progress Notes (Signed)
OT Cancellation Note  Patient Details Name: Tejah Brekke MRN: [id number] DOB: 02-25-1981   Cancelled Treatment:    Reason Eval/Treat Not Completed: Fatigue/lethargy limiting ability to participate.  Will check back another day.  Delores Thelen 07/31/2018, 11:05 AM  [name], OTR/L Acute Rehabilitation Services [phone] [facility] pager [phone] office 07/31/2018

## 2018-07-31 NOTE — Progress Notes (Signed)
Physical Therapy Treatment Patient Details Name: Dominique [name] MRN: [id number] DOB: 03-23-1981 Today's Date: 07/31/2018    History of Present Illness 37 y.o. 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 Dx of hypovolemia, sepsis, cellulitis    PT Comments    Pt able to self navigate around room to and from bathroom without any AD.  Assisted with amb a greater distance in hallway for safety.  Pt plans to D/C to SNF.   Follow Up Recommendations  SNF     Equipment Recommendations       Recommendations for Other Services       Precautions / Restrictions Precautions Precaution Comments: colostomy; pt denies h/o falls in past 1 year Restrictions Weight Bearing Restrictions: No    Mobility  Bed Mobility Overal bed mobility: Modified Independent             General bed mobility comments: increased time  Transfers Overall transfer level: Modified independent               General transfer comment: increased time  Ambulation/Gait Ambulation/Gait assistance: Supervision Gait Distance (Feet): 225 Feet Assistive device: None Gait Pattern/deviations: Step-through pattern;Decreased stride length;Wide base of support Gait velocity: decreased    General Gait Details: no AD increased time Supervision fao safety   Stairs             Wheelchair Mobility    Modified Rankin (Stroke Patients Only)       Balance                                            Cognition Arousal/Alertness: Awake/alert Behavior During Therapy: WFL for tasks assessed/performed Overall Cognitive Status: Within Functional Limits for tasks assessed                                 General Comments: crying at end of session when told her  [facility] was 2 hours away      Exercises      General Comments        Pertinent Vitals/Pain Pain Assessment: Faces Faces Pain Scale: Hurts even more Pain Location: groin Pain Descriptors / Indicators: Aching Pain Intervention(s): Patient requesting pain meds-RN notified    Home Living                      Prior Function            PT Goals (current goals can now be found in the care plan section) Progress towards PT goals: Progressing toward goals    Frequency    Min 2X/week      PT Plan Current plan remains appropriate    Co-evaluation              AM-PAC PT "6 Clicks" Daily Activity  Outcome Measure  Difficulty turning over in bed (including adjusting bedclothes, sheets and blankets)?: A Little Difficulty moving from lying on back to sitting on the side of the bed? : A Little Difficulty sitting down on and standing up from a chair with arms (e.g., wheelchair, bedside commode, etc,.)?: A Little Help needed moving to and  from a bed to chair (including a wheelchair)?: A Little Help needed walking in hospital room?: A Little Help needed climbing 3-5 steps with a railing? : A Little 6 Click Score: 18    End of Session   Activity Tolerance: Patient tolerated treatment well Patient left: Other (comment)(on BSC reported to NT)   PT Visit Diagnosis: Difficulty in walking, not elsewhere classified (R26.2)     Time: 0447-1580 PT Time Calculation (min) (ACUTE ONLY): 24 min  Charges:  $Gait Training: 8-22 mins $Therapeutic Activity: 8-22 mins                     Rica Koyanagi  PTA Acute  Rehabilitation Services Pager      [phone] Office      [phone]

## 2018-07-31 NOTE — Progress Notes (Signed)
Attempted to have patient shower and perform dressing changes. Pt. Refused and stated she "will do it early in the morning" Will attempt again in the morning. Will continue to monitor closely.

## 2018-08-01 NOTE — Progress Notes (Addendum)
Dressings changed to mid-abdomen and mid abdominal folds. Dressing also changed to bilateral groin. Pt would not allow RN to assess or change dressings under either breast, or another side/abdomen/back skin folds. Pt also would not allow RN to assess buttocks/sacrum. Pt stated she placed "tissue" over wounds to stop the drainage and refused dressing change at this time. Pr educated on proper cleaning of wound and dressing change.

## 2018-08-01 NOTE — Progress Notes (Signed)
[facility] PROGRESS NOTE    Progress Note  Dominique [name]  IBB:[id number] DOB: [birth year] DOA: 07/04/2018 PCP: Benito Mccreedy, MD     Brief Narrative:   Dominique [name] is an 37 y.o. female past medical history significant for morbid obesity, obstructive sleep apnea, Crohn's status post perforation with a colostomy, with a chronic abdominal wound who was in her usual state of health until she recently developed recurrent nausea and vomiting for several days with lightheadedness  Assessment/Plan:   Hypovolemia/ Orthostatic hypotension Resolved with aggressive hydration her symptoms resolved. Orthostatics status have resolved.  Hypovolemic hyponatremia: Resolved with IV fluid hydration.  Anemia due to acute blood loss/iron deficiency anemia: Likely due to intermittently bleeding from wound site. She status post 1 unit of packed red blood cells on 07/09/2018, and IV iron. Oral iron supplements.    Metabolic acidosis/acute kidney injury: Likely prerenal azotemia due to with IV fluids and IV bicarbonate.  Crohn's colitis with perforation s/p left colectomy/colostomy 2016: Cont. monitors to output as this may be contributing to her hypovolemia.  Severe wounds with cellulitis/sepsis: She has completed her course of antibiotics. Wound care was consulted they recommended inter-dry.  Sepsis physiology has resolved. Patient is awaiting skilled nursing facility placement as she cannot take care of herself at home.  Indwelling urinary catheter: Discontinued 07/15/2018.  Non-severe protein caloric malnutrition: Nutrition has been consulted they recommended Premier protein  Obstructive sleep apnea: Continue CPAP at night.  Morbid obesity with a BMI of 54: Counselin was performed.  Severe deconditioning: Physical therapy evaluated the patient amended skilled nursing facility, awaiting placement. Not appropriate for CIR.  Sinus tachycardia: The setting of dehydration  improved with fluid bolus and beta-blocker.  Right eye erythema and swelling with right eye blurred vision now improved: Dr. Shelton Silvas discussed with ophthalmology who recommended to use artificial tears. Is had significant improvement in her erythema. Continue follow-up with ophthalmology as an outpatient.  Thrombocytosis: Likely reactive now resolved.  Bilateral knee warmth and swelling: [place] secondary to osteoarthritis, and x-ray showed tricompartmental osteoarthritis continue physical therapy and follow-up with orthopedics as an outpatient.  Hypomagnesemia: Magnesium today is 2.1 continue mag 200 mg daily as an outpatient.  OSA on CPAP      DVT prophylaxis: lovenox Family Communication:none Disposition Plan/Barrier to D/C: awaiting on placement Code Status:     Code Status Orders  (From admission, onward)         Start     Ordered   07/04/18 2335  Full code  Continuous     07/04/18 2337        Code Status History    Date Active Date Inactive Code Status Order ID Comments User Context   05/30/2018 1310 06/03/2018 1814 Full Code [id number]  Alma Friendly, MD Inpatient   04/19/2018 1247 04/23/2018 1912 Full Code [id number]  Debbe Odea, MD ED   01/06/2017 0612 01/07/2017 0325 Full Code [id number]  Norval Morton, MD ED   12/25/2016 1257 12/29/2016 1749 Full Code [id number]  Debbe Odea, MD Inpatient   12/25/2016 0910 12/25/2016 1257 Full Code [id number]  Debbe Odea, MD ED   07/21/2015 0935 07/25/2015 2218 Full Code [id number]  Samella Parr, NP Inpatient   06/26/2015 1759 07/21/2015 0935 Full Code [id number]  Cathlyn Parsons, PA-C Inpatient   06/04/2015 0757 06/26/2015 1759 Full Code [id number]  Gennaro Africa, MD Inpatient   11/16/2014 2238 11/23/2014 1747 Full Code [id number]  Berle Mull, MD ED        IV Access:  Peripheral IV   Procedures and diagnostic studies:   No results found.   Medical Consultants:    None.  Anti-Infectives:    none  Subjective:    [name] she relates no new complaints, patient is crying as she has to go to a facility in [place].  Objective:    Vitals:   07/31/18 1305 07/31/18 2156 08/01/18 0500 08/01/18 0600  BP: 100/60 (!) 103/57  (!) 102/51  Pulse: 81 82  87  Resp: 16 18  18   Temp: 98.6 F (37 C) 98.7 F (37.1 C)  98.7 F (37.1 C)  TempSrc: Oral Oral  Oral  SpO2: 95% 95%  100%  Weight:   132.9 kg   Height:        Intake/Output Summary (Last 24 hours) at 08/01/2018 0834 Last data filed at 08/01/2018 0600 Gross per 24 hour  Intake 1080 ml  Output 201 ml  Net 879 ml   Filed Weights   07/30/18 0639 07/31/18 0500 08/01/18 0500  Weight: 133.9 kg 133.9 kg 132.9 kg    Exam: General exam: In no acute distress. Respiratory system: Good air movement and clear to auscultation. Cardiovascular system: S1 & S2 heard, RRR. Gastrointestinal system: Abdomen is nondistended, soft and nontender.  Central nervous system: Alert and oriented. No focal neurological deficits. Extremities: No pedal edema. Skin: No rashes, lesions or ulcers Psychiatry: Judgement and insight appear normal. Mood & affect appropriate.    Data Reviewed:    Labs: Basic Metabolic Panel: No results for input(s): NA, K, CL, CO2, GLUCOSE, BUN, CREATININE, CALCIUM, MG, PHOS in the last 168 hours. GFR Estimated Creatinine Clearance: 125.2 mL/min (by C-G formula based on SCr of 0.77 mg/dL). Liver Function Tests: No results for input(s): AST, ALT, ALKPHOS, BILITOT, PROT, ALBUMIN in the last 168 hours. No results for input(s): LIPASE, AMYLASE in the last 168 hours. No results for input(s): AMMONIA in the last 168 hours. Coagulation profile No results for input(s): INR, PROTIME in the last 168 hours.  CBC: No results for input(s): WBC, NEUTROABS, HGB, HCT, MCV, PLT in the last 168 hours. Cardiac Enzymes: No results for input(s): CKTOTAL, CKMB, CKMBINDEX, TROPONINI in the last 168 hours. BNP (last 3  results) No results for input(s): PROBNP in the last 8760 hours. CBG: No results for input(s): GLUCAP in the last 168 hours. D-Dimer: No results for input(s): DDIMER in the last 72 hours. Hgb A1c: No results for input(s): HGBA1C in the last 72 hours. Lipid Profile: No results for input(s): CHOL, HDL, LDLCALC, TRIG, CHOLHDL, LDLDIRECT in the last 72 hours. Thyroid function studies: No results for input(s): TSH, T4TOTAL, T3FREE, THYROIDAB in the last 72 hours.  Invalid input(s): FREET3 Anemia work up: No results for input(s): VITAMINB12, FOLATE, FERRITIN, TIBC, IRON, RETICCTPCT in the last 72 hours. Sepsis Labs: No results for input(s): PROCALCITON, WBC, LATICACIDVEN in the last 168 hours. Microbiology No results found for this or any previous visit (from the past 240 hour(s)).   Medications:   . ferrous gluconate  324 mg Oral BID WC  . metoprolol tartrate  50 mg Oral BID  . multivitamin with minerals  1 tablet Oral Daily  . naphazoline-glycerin  1 drop Both Eyes TID WC & HS  . sodium chloride flush  3 mL Intravenous Q12H   Continuous Infusions: . sodium chloride Stopped (07/23/18 1100)      LOS: 26 days   [facility] Pager [phone]  *Please refer to [email or web address], password [email or web address] to  get updated schedule on who will round on this patient, as hospitalists switch teams weekly. If 7PM-7AM, please contact night-coverage at [email or web address], password [email or web address] for any overnight needs.  08/01/2018, 8:34 AM

## 2018-08-01 NOTE — Progress Notes (Signed)
Patient encouraged x3 this far this shift to shower so dressing changes can be done. Pt very tearful, requesting another box of tissues. When asked if she needs assistance to get OOB to shower, pt states "Not right now. Maybe later." Will continue to encourage pt and attempt dressing changes.

## 2018-08-02 NOTE — Progress Notes (Signed)
[facility] PROGRESS NOTE    Progress Note  Dominique [name]  IRW:[id number] DOB: Nov 07, 1980 DOA: 07/04/2018 PCP: Benito Mccreedy, MD     Brief Narrative:   Dominique [name] is an 37 y.o. female past medical history significant for morbid obesity, obstructive sleep apnea, Crohn's status post perforation with a colostomy, with a chronic abdominal wound who was in her usual state of health until she recently developed recurrent nausea and vomiting for several days with lightheadedness  Assessment/Plan:   Hypovolemia/ Orthostatic hypotension Resolved with aggressive hydration her symptoms resolved. Orthostatics status have resolved. Still awaiting placement.  Hypovolemic hyponatremia: Resolved with IV fluid hydration.  Anemia due to acute blood loss/iron deficiency anemia: Likely due to intermittently bleeding from wound site. She status post 1 unit of packed red blood cells on 07/09/2018, and IV iron. Oral iron supplements.    Metabolic acidosis/acute kidney injury: Likely prerenal azotemia due to with IV fluids and IV bicarbonate.  Crohn's colitis with perforation s/p left colectomy/colostomy 2016: Cont. monitors to output as this may be contributing to her hypovolemia.  Severe wounds with cellulitis/sepsis: She has completed her course of antibiotics. Wound care was consulted they recommended inter-dry.  Sepsis physiology has resolved. Patient is awaiting skilled nursing facility placement as she cannot take care of herself at home.  Indwelling urinary catheter: Discontinued 07/15/2018.  Non-severe protein caloric malnutrition: Nutrition has been consulted they recommended Premier protein  Obstructive sleep apnea: Continue CPAP at night.  Morbid obesity with a BMI of 54: Counselin was performed.  Severe deconditioning: Physical therapy evaluated the patient amended skilled nursing facility, awaiting placement. Not appropriate for CIR.  Sinus tachycardia: Right  eye erythema and swelling with right eye blurred vision now improved: Thrombocytosis: Bilateral knee warmth and swelling: Hypomagnesemia:  OSA on CPAP      DVT prophylaxis: lovenox Family Communication:none Disposition Plan/Barrier to D/C: awaiting on placement Code Status:     Code Status Orders  (From admission, onward)         Start     Ordered   07/04/18 2335  Full code  Continuous     07/04/18 2337        Code Status History    Date Active Date Inactive Code Status Order ID Comments User Context   05/30/2018 1310 06/03/2018 1814 Full Code [id number]  Alma Friendly, MD Inpatient   04/19/2018 1247 04/23/2018 1912 Full Code [id number]  Debbe Odea, MD ED   01/06/2017 0612 01/07/2017 0325 Full Code [id number]  Norval Morton, MD ED   12/25/2016 1257 12/29/2016 1749 Full Code [id number]  Debbe Odea, MD Inpatient   12/25/2016 0910 12/25/2016 1257 Full Code [id number]  Debbe Odea, MD ED   07/21/2015 0935 07/25/2015 2218 Full Code [id number]  Samella Parr, NP Inpatient   06/26/2015 1759 07/21/2015 0935 Full Code [id number]  Cathlyn Parsons, PA-C Inpatient   06/04/2015 0757 06/26/2015 1759 Full Code [id number]  Gennaro Africa, MD Inpatient   11/16/2014 2238 11/23/2014 1747 Full Code [id number]  Berle Mull, MD ED        IV Access:    Peripheral IV   Procedures and diagnostic studies:   No results found.   Medical Consultants:    None.  Anti-Infectives:   none  Subjective:    Dominique [name] no complaints.  Objective:    Vitals:   08/01/18 0600 08/01/18 1356 08/01/18 2059 08/02/18 0653  BP: (!) 102/51 (!) 115/54 (!) 106/49 109/61  Pulse: 87 70 84 88  Resp: 18  20 18 18   Temp: 98.7 F (37.1 C) 98.2 F (36.8 C) (!) 97.5 F (36.4 C) 98.7 F (37.1 C)  TempSrc: Oral Oral Oral Oral  SpO2: 100% 94% 99% 97%  Weight:    133.1 kg  Height:        Intake/Output Summary (Last 24 hours) at 08/02/2018 0849 Last data filed at 08/02/2018 0600 Gross per 24 hour    Intake 440 ml  Output 110 ml  Net 330 ml   Filed Weights   07/31/18 0500 08/01/18 0500 08/02/18 0653  Weight: 133.9 kg 132.9 kg 133.1 kg    Exam: General exam: In no acute distress. Respiratory system: Good air movement and clear to auscultation. Cardiovascular system: S1 & S2 heard, RRR. Gastrointestinal system: Abdomen is nondistended, soft and nontender.  Central nervous system: Alert and oriented. No focal neurological deficits. Extremities: No pedal edema. Skin: No rashes, lesions or ulcers Psychiatry: Judgement and insight appear normal. Mood & affect appropriate.    Data Reviewed:    Labs: Basic Metabolic Panel: No results for input(s): NA, K, CL, CO2, GLUCOSE, BUN, CREATININE, CALCIUM, MG, PHOS in the last 168 hours. GFR Estimated Creatinine Clearance: 125.4 mL/min (by C-G formula based on SCr of 0.77 mg/dL). Liver Function Tests: No results for input(s): AST, ALT, ALKPHOS, BILITOT, PROT, ALBUMIN in the last 168 hours. No results for input(s): LIPASE, AMYLASE in the last 168 hours. No results for input(s): AMMONIA in the last 168 hours. Coagulation profile No results for input(s): INR, PROTIME in the last 168 hours.  CBC: No results for input(s): WBC, NEUTROABS, HGB, HCT, MCV, PLT in the last 168 hours. Cardiac Enzymes: No results for input(s): CKTOTAL, CKMB, CKMBINDEX, TROPONINI in the last 168 hours. BNP (last 3 results) No results for input(s): PROBNP in the last 8760 hours. CBG: No results for input(s): GLUCAP in the last 168 hours. D-Dimer: No results for input(s): DDIMER in the last 72 hours. Hgb A1c: No results for input(s): HGBA1C in the last 72 hours. Lipid Profile: No results for input(s): CHOL, HDL, LDLCALC, TRIG, CHOLHDL, LDLDIRECT in the last 72 hours. Thyroid function studies: No results for input(s): TSH, T4TOTAL, T3FREE, THYROIDAB in the last 72 hours.  Invalid input(s): FREET3 Anemia work up: No results for input(s): VITAMINB12, FOLATE,  FERRITIN, TIBC, IRON, RETICCTPCT in the last 72 hours. Sepsis Labs: No results for input(s): PROCALCITON, WBC, LATICACIDVEN in the last 168 hours. Microbiology No results found for this or any previous visit (from the past 240 hour(s)).   Medications:   . ferrous gluconate  324 mg Oral BID WC  . metoprolol tartrate  50 mg Oral BID  . multivitamin with minerals  1 tablet Oral Daily  . naphazoline-glycerin  1 drop Both Eyes TID WC & HS  . sodium chloride flush  3 mL Intravenous Q12H   Continuous Infusions: . sodium chloride Stopped (07/23/18 1100)      LOS: 27 days   [facility] Pager [phone]  *Please refer to [email or web address], password [email or web address] to get updated schedule on who will round on this patient, as hospitalists switch teams weekly. If 7PM-7AM, please contact night-coverage at [email or web address], password [email or web address] for any overnight needs.  08/02/2018, 8:49 AM

## 2018-08-03 NOTE — Progress Notes (Signed)
Clinical Social Worker following patient for support and discharge needs. CSW reached out to [facility] to see if they would be able to take patient today. Patience stated at this time the Business office at her facility still has not looked over the payment contract that was sent to them last week. CSW will continue to follow patient for support.   Rhea Pink, MSW,  [place]

## 2018-08-03 NOTE — Progress Notes (Signed)
[facility] PROGRESS NOTE    Progress Note  Riddhi Grether  WNI:[id number] DOB: 1981-02-12 DOA: 07/04/2018 PCP: Benito Mccreedy, MD     Brief Narrative:   Dominique [name] is an 37 y.o. female past medical history significant for morbid obesity, obstructive sleep apnea, Crohn's status post perforation with a colostomy, with a chronic abdominal wound who was in her usual state of health until she recently developed recurrent nausea and vomiting for several days with lightheadedness  Assessment/Plan:   Hypovolemia/ Orthostatic hypotension Resolved with aggressive hydration her symptoms resolved. Orthostatics status have resolved. Still awaiting placement.  Hypovolemic hyponatremia: Resolved with IV fluid hydration.  Anemia due to acute blood loss/iron deficiency anemia: Hemoglobin is stable.  Due to intermittent bleeding from wounds. Oral iron supplements.    Metabolic acidosis/acute kidney injury: Likely prerenal azotemia due to with IV fluids and IV bicarbonate.  Crohn's colitis with perforation s/p left colectomy/colostomy 2016: Cont. monitors to output as this may be contributing to her hypovolemia.  Severe wounds with cellulitis/sepsis: She has completed her course of antibiotics. Patient is awaiting skilled nursing facility placement as she cannot take care of herself at home.  Indwelling urinary catheter: Discontinued 07/15/2018.  Non-severe protein caloric malnutrition: Nutrition has been consulted they recommended Premier protein  Obstructive sleep apnea: Continue CPAP at night.  Morbid obesity with a BMI of 54: Counselin was performed.  Severe deconditioning: Physical therapy evaluated the patient amended skilled nursing facility, awaiting placement. Not appropriate for CIR.  Sinus tachycardia: Right eye erythema and swelling with right eye blurred vision now improved: Thrombocytosis: Bilateral knee warmth and swelling: Hypomagnesemia:  OSA on  CPAP      DVT prophylaxis: lovenox Family Communication:none Disposition Plan/Barrier to D/C: awaiting on placement Code Status:     Code Status Orders  (From admission, onward)         Start     Ordered   07/04/18 2335  Full code  Continuous     07/04/18 2337        Code Status History    Date Active Date Inactive Code Status Order ID Comments User Context   05/30/2018 1310 06/03/2018 1814 Full Code [id number]  Alma Friendly, MD Inpatient   04/19/2018 1247 04/23/2018 1912 Full Code [id number]  Debbe Odea, MD ED   01/06/2017 0612 01/07/2017 0325 Full Code [id number]  Norval Morton, MD ED   12/25/2016 1257 12/29/2016 1749 Full Code [id number]  Debbe Odea, MD Inpatient   12/25/2016 0910 12/25/2016 1257 Full Code [id number]  Debbe Odea, MD ED   07/21/2015 0935 07/25/2015 2218 Full Code [id number]  Samella Parr, NP Inpatient   06/26/2015 1759 07/21/2015 0935 Full Code [id number]  Cathlyn Parsons, PA-C Inpatient   06/04/2015 0757 06/26/2015 1759 Full Code [id number]  Gennaro Africa, MD Inpatient   11/16/2014 2238 11/23/2014 1747 Full Code [id number]  Berle Mull, MD ED        IV Access:    Peripheral IV   Procedures and diagnostic studies:   No results found.   Medical Consultants:    None.  Anti-Infectives:   none  Subjective:    Ashauna Habermehl no complains.  Objective:    Vitals:   08/02/18 0653 08/02/18 1253 08/02/18 2151 08/03/18 0536  BP: 109/61 (!) 115/51 (!) 108/49 (!) 102/51  Pulse: 88 75 78 79  Resp: 18 16 18 18   Temp: 98.7 F (37.1 C)  98 F (36.7 C) 98.6 F (37 C)  TempSrc: Oral  Oral Oral  SpO2: 97% 97% 99% 98%  Weight: 133.1 kg     Height:        Intake/Output Summary (Last 24 hours) at 08/03/2018 0935 Last data filed at 08/02/2018 1100 Gross per 24 hour  Intake 0 ml  Output -  Net 0 ml   Filed Weights   07/31/18 0500 08/01/18 0500 08/02/18 0653  Weight: 133.9 kg 132.9 kg 133.1 kg    Exam: General exam: In no acute  distress. Respiratory system: Good air movement and clear to auscultation. Cardiovascular system: S1 & S2 heard, RRR. Gastrointestinal system: Abdomen is nondistended, soft and nontender.  Central nervous system: Alert and oriented. No focal neurological deficits. Extremities: No pedal edema. Skin: No rashes, lesions or ulcers Psychiatry: Judgement and insight appear normal. Mood & affect appropriate.    Data Reviewed:    Labs: Basic Metabolic Panel: No results for input(s): NA, K, CL, CO2, GLUCOSE, BUN, CREATININE, CALCIUM, MG, PHOS in the last 168 hours. GFR Estimated Creatinine Clearance: 125.4 mL/min (by C-G formula based on SCr of 0.77 mg/dL). Liver Function Tests: No results for input(s): AST, ALT, ALKPHOS, BILITOT, PROT, ALBUMIN in the last 168 hours. No results for input(s): LIPASE, AMYLASE in the last 168 hours. No results for input(s): AMMONIA in the last 168 hours. Coagulation profile No results for input(s): INR, PROTIME in the last 168 hours.  CBC: No results for input(s): WBC, NEUTROABS, HGB, HCT, MCV, PLT in the last 168 hours. Cardiac Enzymes: No results for input(s): CKTOTAL, CKMB, CKMBINDEX, TROPONINI in the last 168 hours. BNP (last 3 results) No results for input(s): PROBNP in the last 8760 hours. CBG: No results for input(s): GLUCAP in the last 168 hours. D-Dimer: No results for input(s): DDIMER in the last 72 hours. Hgb A1c: No results for input(s): HGBA1C in the last 72 hours. Lipid Profile: No results for input(s): CHOL, HDL, LDLCALC, TRIG, CHOLHDL, LDLDIRECT in the last 72 hours. Thyroid function studies: No results for input(s): TSH, T4TOTAL, T3FREE, THYROIDAB in the last 72 hours.  Invalid input(s): FREET3 Anemia work up: No results for input(s): VITAMINB12, FOLATE, FERRITIN, TIBC, IRON, RETICCTPCT in the last 72 hours. Sepsis Labs: No results for input(s): PROCALCITON, WBC, LATICACIDVEN in the last 168 hours. Microbiology No results found  for this or any previous visit (from the past 240 hour(s)).   Medications:   . ferrous gluconate  324 mg Oral BID WC  . metoprolol tartrate  50 mg Oral BID  . multivitamin with minerals  1 tablet Oral Daily  . naphazoline-glycerin  1 drop Both Eyes TID WC & HS  . sodium chloride flush  3 mL Intravenous Q12H   Continuous Infusions: . sodium chloride Stopped (07/23/18 1100)      LOS: 28 days   [facility] Pager [phone]  *Please refer to [email or web address], password [email or web address] to get updated schedule on who will round on this patient, as hospitalists switch teams weekly. If 7PM-7AM, please contact night-coverage at [email or web address], password [email or web address] for any overnight needs.  08/03/2018, 9:35 AM

## 2018-08-03 NOTE — Progress Notes (Signed)
Pt refused shower/bath/ eye drops and daily weight this morning.

## 2018-08-03 NOTE — Care Management Note (Signed)
Case Management Note  Patient Details  Name: Orchid Glassberg MRN: [id number] Date of Birth: 10/30/1980  Subjective/Objective: Awaiting d/c to SNF-[facility] still assessing-CSW diligently following. Patient agrees, & prefers to transport by w/c.                   Action/Plan:d/c SNF.   Expected Discharge Date:  07/31/18               Expected Discharge Plan:  Skilled Nursing Facility  In-House Referral:  Clinical Social Work  Discharge planning Services  CM Consult  Post Acute Care Choice:  Home Health(Active w/[facility]) Choice offered to:     DME Arranged:    DME Agency:     HH Arranged:    [place] Agency:     Status of Service:  Completed, signed off  If discussed at [employer], dates discussed:    Additional Comments:  Dessa Phi, RN 08/03/2018, 11:25 AM

## 2018-08-04 MED ORDER — SODIUM CHLORIDE 0.9 % IV SOLN
INTRAVENOUS | Status: DC
  Administered 2018-08-04: 11:00:00 via INTRAVENOUS

## 2018-08-04 MED ORDER — SODIUM CHLORIDE 0.9 % IV BOLUS
1000.0000 mL | Freq: Once | INTRAVENOUS | Status: AC
  Administered 2018-08-04: 1000 mL via INTRAVENOUS

## 2018-08-04 NOTE — Progress Notes (Signed)
PT Cancellation Note  Patient Details Name: Dominique [name] MRN: [id number] DOB: [birth year]   Cancelled Treatment:    Reason Eval/Treat Not Completed: Medical issues which prohibited therapy(pt orthostatic during OT session this morning. ) Will follow.    Blondell Reveal Kistler PT 08/04/2018  Acute Rehabilitation Services Pager [phone] Office [phone]

## 2018-08-04 NOTE — Progress Notes (Signed)
Occupational Therapy Treatment Patient Details Name: Dominique [name] MRN: [id number] DOB: [birth year] Today's Date: 08/04/2018    History of present illness 37 y.o. 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 Dx of hypovolemia, sepsis, cellulitis   OT comments  Pt was limited by BP today.  See vitals section of chart.  Asymptomatic but sitting EOB dropped further to 74/44.     Follow Up Recommendations  SNF    Equipment Recommendations  3 in 1 bedside commode    Recommendations for Other Services      Precautions / Restrictions Precautions Precaution Comments: colostomy; pt denies h/o falls in past 1 year Restrictions Weight Bearing Restrictions: No       Mobility Bed Mobility         Supine to sit: Supervision Sit to supine: Min assist      Transfers                      Balance     Sitting balance-Leahy Scale: Good                                     ADL either performed or assessed with clinical judgement   ADL                                          Performed AROM bil UE exercises at EOB     Vision       Perception     Praxis      Cognition Arousal/Alertness: Awake/alert Behavior During Therapy: WFL for tasks assessed/performed Overall Cognitive Status: Within Functional Limits for tasks assessed                                          Exercises     Shoulder Instructions       General Comments Pt's BP was running low today:  see vitals section of chart.  Initially BP remained the same from lying to sitting and performed UE exercises.  BP dropped further so returned to supine.  BP came up in lying    Pertinent Vitals/ Pain       Pain Assessment: Faces Faces  Pain Scale: Hurts even more Pain Location: groin with movement Pain Descriptors / Indicators: Aching Pain Intervention(s): Limited activity within patient's tolerance;Monitored during session;Repositioned  Home Living                                          Prior Functioning/Environment              Frequency  Min 2X/week        Progress Toward Goals  OT Goals(current goals can now be found in the care plan section)  Progress towards OT goals: (not progressing this session due to BP)     Plan      Co-evaluation  AM-PAC PT "6 Clicks" Daily Activity     Outcome Measure   Help from another person eating meals?: None Help from another person taking care of personal grooming?: A Little Help from another person toileting, which includes using toliet, bedpan, or urinal?: A Little Help from another person bathing (including washing, rinsing, drying)?: A Little Help from another person to put on and taking off regular upper body clothing?: A Little Help from another person to put on and taking off regular lower body clothing?: A Little 6 Click Score: 19    End of Session    OT Visit Diagnosis: Unsteadiness on feet (R26.81);Muscle weakness (generalized) (M62.81)   Activity Tolerance Treatment limited secondary to medical complications (Comment)(BP)   Patient Left in bed;with call bell/phone within reach   Nurse Communication          Time: 3546-5681 OT Time Calculation (min): 10 min  Charges: OT General Charges $OT Visit: 1 Visit OT Treatments $Therapeutic Activity: 8-22 mins  [name], OTR/L Acute Rehabilitation Services [phone] [facility] pager [phone] office 08/04/2018   Dominique [name] 08/04/2018, 12:30 PM

## 2018-08-04 NOTE — Progress Notes (Signed)
[facility] PROGRESS NOTE    Progress Note  Dominique [name]  KPT:[id number] DOB: 01/11/1981 DOA: 07/04/2018 PCP: Benito Mccreedy, MD     Brief Narrative:   Dominique [name] is an 37 y.o. female past medical history significant for morbid obesity, obstructive sleep apnea, Crohn's status post perforation with a colostomy, with a chronic abdominal wound who was in her usual state of health until she recently developed recurrent nausea and vomiting for several days with lightheadedness  Assessment/Plan:   Hypovolemia/ Orthostatic hypotension Resolved with aggressive hydration her symptoms resolved. Orthostatics status have resolved. Still awaiting placement.  Hypovolemic hyponatremia: Resolved with IV fluid hydration.  Anemia due to acute blood loss/iron deficiency anemia: Likely due to intermittent bleeding from wound site. Iron level was 14 ferritin was 15 she status post 1 unit of packed red blood cells. Now on oral supplementation. Check hemoglobin intermittently. Oral iron supplements.    Metabolic acidosis/acute kidney injury: Likely prerenal azotemia due to with IV fluids and IV bicarbonate.  Crohn's colitis with perforation s/p left colectomy/colostomy 2016: Cont. monitors to output as this may be contributing to her hypovolemia.  Severe wounds with cellulitis/sepsis: Has chronic wound and abdomen from prior surgery and new wounds due to moisture damage. She will complete her course of antibiotics. Wound care recommended into dry physiology of sepsis has been resolved. Patient is awaiting skilled nursing facility as she cannot take care of wounds at home.  Indwelling urinary catheter: Discontinued 07/15/2018.  Non-severe protein caloric malnutrition: Nutrition has been consulted they recommended Premier protein  Obstructive sleep apnea: Continue CPAP at night.  Morbid obesity with a BMI of 54: Counselin was performed.  Severe deconditioning: Physical  therapy evaluated the patient amended skilled nursing facility, awaiting placement. Not appropriate for CIR.  Sinus tachycardia: Right eye erythema and swelling with right eye blurred vision now improved: Thrombocytosis: Bilateral knee warmth and swelling: Hypomagnesemia:  OSA on CPAP      DVT prophylaxis: lovenox Family Communication:none Disposition Plan/Barrier to D/C: awaiting on placement Code Status:     Code Status Orders  (From admission, onward)         Start     Ordered   07/04/18 2335  Full code  Continuous     07/04/18 2337        Code Status History    Date Active Date Inactive Code Status Order ID Comments User Context   05/30/2018 1310 06/03/2018 1814 Full Code [id number]  Alma Friendly, MD Inpatient   04/19/2018 1247 04/23/2018 1912 Full Code [id number]  Debbe Odea, MD ED   01/06/2017 0612 01/07/2017 0325 Full Code [id number]  Norval Morton, MD ED   12/25/2016 1257 12/29/2016 1749 Full Code [id number]  Debbe Odea, MD Inpatient   12/25/2016 0910 12/25/2016 1257 Full Code [id number]  Debbe Odea, MD ED   07/21/2015 0935 07/25/2015 2218 Full Code [id number]  Samella Parr, NP Inpatient   06/26/2015 1759 07/21/2015 0935 Full Code [id number]  Cathlyn Parsons, PA-C Inpatient   06/04/2015 0757 06/26/2015 1759 Full Code [id number]  Gennaro Africa, MD Inpatient   11/16/2014 2238 11/23/2014 1747 Full Code [id number]  Berle Mull, MD ED        IV Access:    Peripheral IV   Procedures and diagnostic studies:   No results found.   Medical Consultants:    None.  Anti-Infectives:   none  Subjective:    Dominique [name] no complains.  Objective:    Vitals:   08/03/18 2048 08/04/18 0415  08/04/18 0500 08/04/18 1001  BP: (!) 95/57 98/60  (!) 81/44  Pulse: 86 97  77  Resp: 16 18    Temp: 99.1 F (37.3 C) 98.3 F (36.8 C)    TempSrc: Oral Oral    SpO2: 97% 100%    Weight:   133.2 kg   Height:       No intake or output data in the 24 hours ending  08/04/18 1008 Filed Weights   08/02/18 0653 08/03/18 1022 08/04/18 0500  Weight: 133.1 kg 135.3 kg 133.2 kg    Exam: General exam: In no acute distress, morbidly obese Respiratory system: Good air movement and clear to auscultation. Cardiovascular system: S1 & S2 heard, RRR. Gastrointestinal system: Abdomen is nondistended, soft and nontender.  Central nervous system: Alert and oriented. No focal neurological deficits. Extremities: No pedal edema. Psychiatry: Judgement and insight appear normal. Mood & affect appropriate.    Data Reviewed:    Labs: Basic Metabolic Panel: No results for input(s): NA, K, CL, CO2, GLUCOSE, BUN, CREATININE, CALCIUM, MG, PHOS in the last 168 hours. GFR Estimated Creatinine Clearance: 125.4 mL/min (by C-G formula based on SCr of 0.77 mg/dL). Liver Function Tests: No results for input(s): AST, ALT, ALKPHOS, BILITOT, PROT, ALBUMIN in the last 168 hours. No results for input(s): LIPASE, AMYLASE in the last 168 hours. No results for input(s): AMMONIA in the last 168 hours. Coagulation profile No results for input(s): INR, PROTIME in the last 168 hours.  CBC: No results for input(s): WBC, NEUTROABS, HGB, HCT, MCV, PLT in the last 168 hours. Cardiac Enzymes: No results for input(s): CKTOTAL, CKMB, CKMBINDEX, TROPONINI in the last 168 hours. BNP (last 3 results) No results for input(s): PROBNP in the last 8760 hours. CBG: No results for input(s): GLUCAP in the last 168 hours. D-Dimer: No results for input(s): DDIMER in the last 72 hours. Hgb A1c: No results for input(s): HGBA1C in the last 72 hours. Lipid Profile: No results for input(s): CHOL, HDL, LDLCALC, TRIG, CHOLHDL, LDLDIRECT in the last 72 hours. Thyroid function studies: No results for input(s): TSH, T4TOTAL, T3FREE, THYROIDAB in the last 72 hours.  Invalid input(s): FREET3 Anemia work up: No results for input(s): VITAMINB12, FOLATE, FERRITIN, TIBC, IRON, RETICCTPCT in the last 72  hours. Sepsis Labs: No results for input(s): PROCALCITON, WBC, LATICACIDVEN in the last 168 hours. Microbiology No results found for this or any previous visit (from the past 240 hour(s)).   Medications:   . ferrous gluconate  324 mg Oral BID WC  . metoprolol tartrate  50 mg Oral BID  . multivitamin with minerals  1 tablet Oral Daily  . naphazoline-glycerin  1 drop Both Eyes TID WC & HS   Continuous Infusions: . sodium chloride Stopped (07/23/18 1100)      LOS: 29 days   Charlynne Cousins  [facility] Pager [phone]  *Please refer to [email or web address], password [email or web address] to get updated schedule on who will round on this patient, as hospitalists switch teams weekly. If 7PM-7AM, please contact night-coverage at [email or web address], password [email or web address] for any overnight needs.  08/04/2018, 10:08 AM

## 2018-08-05 NOTE — Progress Notes (Signed)
Pt refused for dressing under left and right breast to be change. This RN offered multiple times to clean and re-dress wounds, but pt persistently refused.

## 2018-08-05 NOTE — Progress Notes (Signed)
PROGRESS NOTE  Dominique [name] LNL:[id number] DOB: [birth year] DOA: 07/04/2018 PCP: Benito Mccreedy, MD  HPI/Brief Narrative  Dominique [name] is a 37 y.o. year old female with medical history significant for Crohn's disease s/p  colectomy (2016) with colostomy, multiple peri-inguinal and peri-gluteal wounds  who presented on 07/04/2018 with recurrent nausea and vomiting with lightheadedness while standing and was found to have sepsis secondary to cellulitis from chronic periinguinal/perigluteal wounds and orthostatic hypotension which are now all resolved. .  Subjective Pain is fairly controlled.  Still having some worsening pain with mobility.  Assessment/Plan:  #Multiple intertriginous wounds (abdomen, peringuinal and perigluteal), stable.  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  Patient was unable to tolerate outpatient wound care and would benefit from skilled nursing facility, currently awaiting approval for such.  Judicious pain control oxycodone 15 mg q4H PRN every 4 as needed, breakthrough IV Dilaudid q 4 H PRN. Followed at [facility] for [facility], wound recommends reevaluation and exploration for other options on discharge  Crohn's disease, status post colectomy with colostomy (2016), stable no active signs of intraintestinal disease.  #Bilateral eye redness last evaluated by ophthalmology on inpatient admission and 03/2018 at that time was started on Maxitrol and Zirgan drops.  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  Will discuss with ophthalmology since patient has been hospitalized unable to follow-up as outpatient. Currently on clear eyes drops previously on tobramycin drops during admission  #Chronic anemia, likely chronic disease (crohn's) and some  blood loss from wounds.  Did receive transfusion during hospitalization. Stable at baseline, no signs or symptoms of bleeding.  Monitor CBC intermittently. Continue oral iron  #Bilateral knee wants and swelling, stable.  Likely secondary to OA.  X-ray is unrevealing consistent with OA.  Continue to monitor pain control as above  #Sinus Tachycardia. Stable. Continue lopressor 50 mg BID  #Orthostatic hypotension. Resolved. Hypovoemic on admission in setting of sepsis and loss from wounds. Improved with fluid resuscitation.   #AKI with metabolic acidosis, resolved.  Likely prerenal related to dehydration. Resolved with IV fluids and bicarb during admission  #OSA, CPAP nightly  Morbid obesity. BMI 54. Counseling performed during admission.   #Severe deconditioning. Evaluated by PT who recommends SNF. Not CIR candidate.   #Moderate not severe protein calorie malnutrition.  Has not been eating much in the hospital and lost about 30 pounds.  Nutrition consulted and recommended appropriate supplementation.  (Premier protein)  Code Status: Full code  Family Communication: No family at bedside  Disposition Plan: Despite having improved mobility with PT they will be followed for home health therapy, patient will benefit from skilled nursing facility given she has no care givers and is previously failed outpatient recommendations for wound care (not adequate cleaning or frequent dressing changes) in the past prompting multiple readmissions.   Consultants:  None     Procedures:  None  Antimicrobials: Anti-infectives (From admission, onward)   Start     Dose/Rate Route Frequency Ordered Stop   07/09/18 0000  sulfamethoxazole-trimethoprim (BACTRIM) 400-80 MG tablet     1 tablet Oral 2 times daily 07/09/18 1619     07/07/18 1000  vancomycin (VANCOCIN) 1,500 mg in sodium chloride 0.9 % 500 mL IVPB  Status:  Discontinued     1,500 mg 250 mL/hr over 120 Minutes Intravenous Every 24  hours  07/06/18 1014 07/07/18 0938   07/07/18 1000  sulfamethoxazole-trimethoprim (BACTRIM DS,SEPTRA DS) 800-160 MG per tablet 2 tablet  Status:  Discontinued     2 tablet Oral Every 12 hours 07/07/18 0938 07/14/18 0734   07/06/18 1200  cefTRIAXone (ROCEPHIN) 2 g in sodium chloride 0.9 % 100 mL IVPB  Status:  Discontinued     2 g 200 mL/hr over 30 Minutes Intravenous Every 24 hours 07/06/18 0948 07/07/18 0938   07/06/18 1100  doxycycline (VIBRAMYCIN) 100 mg in sodium chloride 0.9 % 250 mL IVPB  Status:  Discontinued     100 mg 125 mL/hr over 120 Minutes Intravenous Every 12 hours 07/06/18 0915 07/06/18 1002   07/06/18 1015  vancomycin (VANCOCIN) 2,500 mg in sodium chloride 0.9 % 500 mL IVPB     2,500 mg 250 mL/hr over 120 Minutes Intravenous  Once 07/06/18 1014 07/07/18 0826         Cultures:  1 of 2 blood cultures, 07/04/2018 coag negative staph, urine culture 10/13-  Telemetry: No  DVT prophylaxis: SCDs   Objective: Vitals:   08/05/18 1301 08/05/18 1520 08/05/18 1710 08/05/18 2114  BP: (!) 104/52 (!) 112/55 (!) 117/59 (!) 121/56  Pulse: 96  78 80  Resp: 16   18  Temp: 98.3 F (36.8 C)   98.5 F (36.9 C)  TempSrc: Oral   Oral  SpO2: 97%   99%  Weight:      Height:        Intake/Output Summary (Last 24 hours) at 08/05/2018 2158 Last data filed at 08/05/2018 1859 Gross per 24 hour  Intake 222 ml  Output -  Net 222 ml   Filed Weights   08/02/18 0653 08/03/18 1022 08/04/18 0500  Weight: 133.1 kg 135.3 kg 133.2 kg    Exam:  Constitutional: Morbidly obese female, no acute distress Eyes: Bilateral eye redness, limited EOMI right eye, reports blurry vision in left eye with drainage from both eyes present ENMT: Oropharynx with moist mucous membranes Cardiovascular: RRR no MRGs, with no peripheral edema Respiratory: Normal respiratory effort, clear breath sounds  Abdomen: Soft,non-tender,  Skin: dressings in place on bilateral groin and anterior abdomen. Without skin  tenting  Neurologic: Grossly no focal neuro deficit. Psychiatric:Appropriate affect, and mood. Mental status AAOx3  Data Reviewed: CBC: No results for input(s): WBC, NEUTROABS, HGB, HCT, MCV, PLT in the last 168 hours. Basic Metabolic Panel: No results for input(s): NA, K, CL, CO2, GLUCOSE, BUN, CREATININE, CALCIUM, MG, PHOS in the last 168 hours. GFR: Estimated Creatinine Clearance: 125.4 mL/min (by C-G formula based on SCr of 0.77 mg/dL). Liver Function Tests: No results for input(s): AST, ALT, ALKPHOS, BILITOT, PROT, ALBUMIN in the last 168 hours. No results for input(s): LIPASE, AMYLASE in the last 168 hours. No results for input(s): AMMONIA in the last 168 hours. Coagulation Profile: No results for input(s): INR, PROTIME in the last 168 hours. Cardiac Enzymes: No results for input(s): CKTOTAL, CKMB, CKMBINDEX, TROPONINI in the last 168 hours. BNP (last 3 results) No results for input(s): PROBNP in the last 8760 hours. HbA1C: No results for input(s): HGBA1C in the last 72 hours. CBG: No results for input(s): GLUCAP in the last 168 hours. Lipid Profile: No results for input(s): CHOL, HDL, LDLCALC, TRIG, CHOLHDL, LDLDIRECT in the last 72 hours. Thyroid Function Tests: No results for input(s): TSH, T4TOTAL, FREET4, T3FREE, THYROIDAB in the last 72 hours. Anemia Panel: No results for input(s): VITAMINB12, FOLATE, FERRITIN, TIBC, IRON, RETICCTPCT in the last  72 hours. Urine analysis:    Component Value Date/Time   COLORURINE YELLOW 07/05/2018 0511   APPEARANCEUR CLEAR 07/05/2018 0511   LABSPEC 1.020 07/05/2018 0511   PHURINE 5.0 07/05/2018 0511   GLUCOSEU NEGATIVE 07/05/2018 0511   HGBUR NEGATIVE 07/05/2018 0511   BILIRUBINUR NEGATIVE 07/05/2018 0511   KETONESUR 5 (A) 07/05/2018 0511   PROTEINUR 30 (A) 07/05/2018 0511   UROBILINOGEN 0.2 07/22/2015 0405   NITRITE NEGATIVE 07/05/2018 0511   LEUKOCYTESUR NEGATIVE 07/05/2018 0511   Sepsis  Labs: @LABRCNTIP (procalcitonin:4,lacticidven:4)  )No results found for this or any previous visit (from the past 240 hour(s)).    Studies: No results found.  Scheduled Meds: . ferrous gluconate  324 mg Oral BID WC  . metoprolol tartrate  50 mg Oral BID  . multivitamin with minerals  1 tablet Oral Daily  . naphazoline-glycerin  1 drop Both Eyes TID WC & HS    Continuous Infusions: . sodium chloride Stopped (07/23/18 1100)     LOS: 30 days     Desiree Hane, MD [facility] Pager [phone]  If 7PM-7AM, please contact night-coverage [email or web address] Password [facility] 08/05/2018, 9:58 PM

## 2018-08-05 NOTE — Progress Notes (Signed)
Nutrition Follow-up  DOCUMENTATION CODES:   Non-severe (moderate) malnutrition in context of chronic illness, Morbid obesity  INTERVENTION:  - Continue Magic Cup once/day.  - Continue to encourage PO intakes.    NUTRITION DIAGNOSIS:   Moderate Malnutrition related to chronic illness as evidenced by energy intake < or equal to 75% for > or equal to 1 month, percent weight loss. -ongoing, improving.   GOAL:   Patient will meet greater than or equal to 90% of their needs -unmet on average.  MONITOR:   PO intake, Supplement acceptance, Weight trends, Labs, Skin  ASSESSMENT:   37 y.o. female past medical history significant for obesity, OSA, Crohn's disease s/p perforation with colostomy, chronic wound. She was in her usual health state of health until she developed recurrent nausea and vomiting several days ago, she is not lightheaded. Patient admitted with diagnosis of hypovolemia.   Weight has been stable since 10/28. Last documented intakes are 100% of breakfast and dinner and 50% of lunch on 11/8 and she refused breakfast on 11/10.   Patient reports that she ate breakfast and dinner yesterday, reports she ate well for these meals but did not provide further detail. She had not felt up to breakfast this AM.  Patient has tried all oral nutrition supplements and refuses all of them but will sometimes have a few bites of Magic Cup.    Medications reviewed; daily multivitamin with minerals. Labs reviewed. IVF; NS @ 125 mL/hr.         Diet Order:   Diet Order            Diet - low sodium heart healthy        Diet - low sodium heart healthy        Diet Heart Room service appropriate? Yes; Fluid consistency: Thin  Diet effective now              EDUCATION NEEDS:   Not appropriate for education at this time  Skin:  Skin Assessment: Skin Integrity Issues: Skin Integrity Issues:: Other (Comment) Other: per flowsheet, non-pressure wounds to: abdomen, upper and lower  abdomen, L breast, bilateral groins, sacrum, and bilateral lower back.  Last BM:  11/12  Height:   Ht Readings from Last 1 Encounters:  07/04/18 5' 2"  (1.575 m)    Weight:   Wt Readings from Last 1 Encounters:  08/04/18 133.2 kg    Ideal Body Weight:  50 kg  BMI:  Body mass index is 53.72 kg/m.  Estimated Nutritional Needs:   Kcal:  2230-2510 (16-18 kcal/kg)  Protein:  135-145 grams  Fluid:  >/= 2.3 L/day     Jarome Matin, MS, RD, LDN, [facility] Inpatient Clinical Dietitian Pager # [phone] After hours/weekend pager # [phone]

## 2018-08-06 MED ORDER — TRAMADOL HCL 50 MG PO TABS
50.0000 mg | ORAL_TABLET | Freq: Four times a day (QID) | ORAL | Status: DC | PRN
  Administered 2018-08-06 – 2018-08-23 (×9): 50 mg via ORAL
  Filled 2018-08-06 (×13): qty 1

## 2018-08-06 MED ORDER — ACETAMINOPHEN 325 MG PO TABS
650.0000 mg | ORAL_TABLET | Freq: Four times a day (QID) | ORAL | Status: DC
  Administered 2018-08-06 – 2018-08-24 (×66): 650 mg via ORAL
  Filled 2018-08-06 (×69): qty 2

## 2018-08-06 NOTE — Progress Notes (Signed)
PROGRESS NOTE  Dominique [name] RWE:[id number] DOB: 08/13/1981 DOA: 07/04/2018 PCP: Benito Mccreedy, MD  HPI/Brief Narrative  Dominique [name] is a 37 y.o. year old female with medical history significant for Crohn's disease s/p  colectomy (2016) with colostomy, multiple peri-inguinal and peri-gluteal wounds  who presented on 07/04/2018 with recurrent nausea and vomiting with lightheadedness while standing and was found to have sepsis secondary to cellulitis from chronic periinguinal/perigluteal wounds and orthostatic hypotension which are now all resolved. .  Subjective NO acute complaints.   Assessment/Plan:  #Multiple intertriginous wounds (abdomen, peringuinal and perigluteal), stable.  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 Looks clean with no overt signs of sepsis or concern for infection currently (was present on admission and completed 7 day course of antibiotics: bactrim).  Patient was unable to tolerate outpatient wound care and would benefit from skilled nursing facility, currently awaiting approval for such.  Judicious pain control oxycodone 15 mg q4H PRN every 4 as needed for severe, add scheduled tylenol, tramadol for moderate pain PRN, breakthrough IV Dilaudid q 4 H PRN. Followed at [facility] for GI, wound recommends reevaluation and exploration for other options on discharge  #Crohn's disease, status post colectomy with colostomy (2016), stable no active signs of intraintestinal disease.  #Bilateral eye redness last evaluated by ophthalmology on inpatient admission and 03/2018 at that time was started on Maxitrol and Zirgan drops.  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  Will discuss with ophthalmology since patient has been hospitalized and unable to follow-up as outpatient. Currently on clear eyes drops previously on tobramycin drops during admission  #Chronic anemia, likely chronic disease  (crohn's) and some blood loss from wounds.  Did receive transfusion during hospitalization. Stable at baseline, no signs or symptoms of bleeding.  Monitor CBC intermittently. Continue oral iron  #Bilateral knee wants and swelling, stable.  Likely secondary to OA.  X-ray is unrevealing consistent with OA.  Continue to monitor pain control as above  #Sinus Tachycardia. Stable. Continue lopressor 50 mg BID  #Orthostatic hypotension. Resolved. Hypovolemic on admission in setting of sepsis and loss from wounds. Improved with fluid resuscitation.   #AKI with metabolic acidosis, resolved.  Likely prerenal related to dehydration. Resolved with IV fluids and bicarb during admission  #OSA, CPAP nightly  Morbid obesity. BMI 54. Counseling performed during admission.   #Severe deconditioning. Evaluated by PT who recommends SNF. Not CIR candidate.   #Moderate not severe protein calorie malnutrition.  Has not been eating much in the hospital and lost about 30 pounds.  Nutrition consulted and recommended appropriate supplementation.  (Premier protein)  Code Status: Full code  Family Communication: No family at bedside  Disposition Plan: Despite having improved mobility with PT they will be followed for home health therapy, patient will benefit from skilled nursing facility given she has no care givers and is previously failed outpatient recommendations for wound care (not adequate cleaning or frequent dressing changes) in the past prompting multiple readmissions.   Consultants:  None     Procedures:  None  Antimicrobials: Anti-infectives (From admission, onward)   Start     Dose/Rate Route Frequency Ordered Stop   07/09/18 0000  sulfamethoxazole-trimethoprim (BACTRIM) 400-80 MG tablet     1 tablet Oral 2 times daily 07/09/18 1619     07/07/18 1000  vancomycin (VANCOCIN) 1,500 mg in sodium chloride 0.9 % 500 mL IVPB  Status:  Discontinued     1,500 mg 250 mL/hr over 120  Minutes Intravenous  Every 24 hours 07/06/18 1014 07/07/18 0938   07/07/18 1000  sulfamethoxazole-trimethoprim (BACTRIM DS,SEPTRA DS) 800-160 MG per tablet 2 tablet  Status:  Discontinued     2 tablet Oral Every 12 hours 07/07/18 0938 07/14/18 0734   07/06/18 1200  cefTRIAXone (ROCEPHIN) 2 g in sodium chloride 0.9 % 100 mL IVPB  Status:  Discontinued     2 g 200 mL/hr over 30 Minutes Intravenous Every 24 hours 07/06/18 0948 07/07/18 0938   07/06/18 1100  doxycycline (VIBRAMYCIN) 100 mg in sodium chloride 0.9 % 250 mL IVPB  Status:  Discontinued     100 mg 125 mL/hr over 120 Minutes Intravenous Every 12 hours 07/06/18 0915 07/06/18 1002   07/06/18 1015  vancomycin (VANCOCIN) 2,500 mg in sodium chloride 0.9 % 500 mL IVPB     2,500 mg 250 mL/hr over 120 Minutes Intravenous  Once 07/06/18 1014 07/07/18 0826        Cultures:  1 of 2 blood cultures, 07/04/2018 coag negative staph, urine culture 10/13-  Telemetry: No  DVT prophylaxis: SCDs   Objective: Vitals:   08/06/18 0500 08/06/18 0627 08/06/18 0935 08/06/18 1350  BP:  (!) 124/52 (!) 104/50 (!) 96/48  Pulse:  83 72 98  Resp:  18  16  Temp:  98.4 F (36.9 C)  98.3 F (36.8 C)  TempSrc:  Oral  Oral  SpO2:  100%  99%  Weight: 132.9 kg     Height:        Intake/Output Summary (Last 24 hours) at 08/06/2018 1744 Last data filed at 08/06/2018 1629 Gross per 24 hour  Intake 787 ml  Output -  Net 787 ml   Filed Weights   08/03/18 1022 08/04/18 0500 08/06/18 0500  Weight: 135.3 kg 133.2 kg 132.9 kg    Exam:  Constitutional: Morbidly obese female, no acute distress Eyes: Bilateral eye redness, limited EOMI right eye, reports blurry vision in left eye with drainage from both eyes present ENMT: Oropharynx with moist mucous membranes Cardiovascular: RRR no MRGs, with no peripheral edema Respiratory: Normal respiratory effort, clear breath sounds  Abdomen: Soft,non-tender,  Skin: dressings in place on bilateral groin and anterior abdomen.  Without skin tenting  Neurologic: Grossly no focal neuro deficit. Psychiatric:Appropriate affect, and mood. Mental status AAOx3  Data Reviewed: CBC: No results for input(s): WBC, NEUTROABS, HGB, HCT, MCV, PLT in the last 168 hours. Basic Metabolic Panel: No results for input(s): NA, K, CL, CO2, GLUCOSE, BUN, CREATININE, CALCIUM, MG, PHOS in the last 168 hours. GFR: Estimated Creatinine Clearance: 125.2 mL/min (by C-G formula based on SCr of 0.77 mg/dL). Liver Function Tests: No results for input(s): AST, ALT, ALKPHOS, BILITOT, PROT, ALBUMIN in the last 168 hours. No results for input(s): LIPASE, AMYLASE in the last 168 hours. No results for input(s): AMMONIA in the last 168 hours. Coagulation Profile: No results for input(s): INR, PROTIME in the last 168 hours. Cardiac Enzymes: No results for input(s): CKTOTAL, CKMB, CKMBINDEX, TROPONINI in the last 168 hours. BNP (last 3 results) No results for input(s): PROBNP in the last 8760 hours. HbA1C: No results for input(s): HGBA1C in the last 72 hours. CBG: No results for input(s): GLUCAP in the last 168 hours. Lipid Profile: No results for input(s): CHOL, HDL, LDLCALC, TRIG, CHOLHDL, LDLDIRECT in the last 72 hours. Thyroid Function Tests: No results for input(s): TSH, T4TOTAL, FREET4, T3FREE, THYROIDAB in the last 72 hours. Anemia Panel: No results for input(s): VITAMINB12, FOLATE, FERRITIN, TIBC, IRON, RETICCTPCT  in the last 72 hours. Urine analysis:    Component Value Date/Time   COLORURINE YELLOW 07/05/2018 0511   APPEARANCEUR CLEAR 07/05/2018 0511   LABSPEC 1.020 07/05/2018 0511   PHURINE 5.0 07/05/2018 0511   GLUCOSEU NEGATIVE 07/05/2018 0511   HGBUR NEGATIVE 07/05/2018 0511   BILIRUBINUR NEGATIVE 07/05/2018 0511   KETONESUR 5 (A) 07/05/2018 0511   PROTEINUR 30 (A) 07/05/2018 0511   UROBILINOGEN 0.2 07/22/2015 0405   NITRITE NEGATIVE 07/05/2018 0511   LEUKOCYTESUR NEGATIVE 07/05/2018 0511   Sepsis  Labs: @LABRCNTIP (procalcitonin:4,lacticidven:4)  )No results found for this or any previous visit (from the past 240 hour(s)).    Studies: No results found.  Scheduled Meds: . acetaminophen  650 mg Oral Q6H  . ferrous gluconate  324 mg Oral BID WC  . metoprolol tartrate  50 mg Oral BID  . multivitamin with minerals  1 tablet Oral Daily  . naphazoline-glycerin  1 drop Both Eyes TID WC & HS    Continuous Infusions: . sodium chloride Stopped (07/23/18 1100)     LOS: 31 days     Desiree Hane, MD [facility] Pager [phone]  If 7PM-7AM, please contact night-coverage [email or web address] Password [facility] 08/06/2018, 5:44 PM

## 2018-08-06 NOTE — Progress Notes (Signed)
PT Cancellation Note  Patient Details Name: Dominique [name] MRN: [id number] DOB: [birth year]   Cancelled Treatment:    Reason Eval/Treat Not Completed: Fatigue/lethargy limiting ability to participate Pt politely declined stating she had been up to [facility] today and plans on getting in the shower later.  Pt requests PT to check back tomorrow.  Will check back as schedule permits.   Sharetta Ricchio,KATHrine E 08/06/2018, 5:05 PM Carmelia Bake, PT, DPT Acute Rehabilitation Services Office: [phone] Pager: [phone]

## 2018-08-07 NOTE — Progress Notes (Signed)
PROGRESS NOTE  Dominique [name] MLY:[id number] DOB: June 27, 1981 DOA: 07/04/2018 PCP: Benito Mccreedy, MD  HPI/Brief Narrative  Dominique [name] is a 37 y.o. year old female with medical history significant for Crohn's disease s/p  colectomy (2016) with colostomy, multiple peri-inguinal and peri-gluteal wounds  who presented on 07/04/2018 with recurrent nausea and vomiting with lightheadedness while standing and was found to have sepsis secondary to cellulitis from chronic periinguinal/perigluteal wounds and orthostatic hypotension which are now all resolved. Currently awaiting placement in SNF  Subjective NO acute complaints.   Assessment/Plan:  #Multiple intertriginous wounds (abdomen, peringuinal and perigluteal), stable.  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  Continuously clean with no overt signs of infection.  Patient with a 7-day course of Bactrim during hospital stay.  Currently awaiting placement at skilled nursing facility as patient has failed outpatient wound care requiring 3 readmissions for similar presentation.  Judicious pain control oxycodone 15 mg q4H PRN every 4H as needed for severe,  scheduled tylenol, tramadol for moderate pain PRN, breakthrough IV Dilaudid q 4 H PRN. Followed at [facility] for GI, wound nursing recommends reevaluation and exploration for other options on discharge  #Crohn's disease, status post colectomy with colostomy (2016), stable no active signs of intraintestinal disease.  #Bilateral eye redness last evaluated by ophthalmology on inpatient admission and 03/2018 at that time was started on Maxitrol and Zirgan drops. Improvement in redness in right eye now. Only vision in left eye( no change)   Currently on clear eyes drops previously on tobramycin drops during admission  #Chronic anemia, likely anemia of chronic disease (crohn's) and some blood loss from wounds.  Did receive transfusion during hospitalization. Stable at baseline, no signs or  symptoms of bleeding.  Monitor CBC intermittently. Continue oral iron  #Bilateral knee wants and swelling, stable.  Likely secondary to OA.  X-ray is unrevealing consistent with OA.  Continue to monitor pain control as above  #Sinus Tachycardia. Stable. Continue lopressor 50 mg BID  #Orthostatic hypotension. Resolved. Hypovolemic on admission in setting of sepsis and loss from wounds. Improved with fluid resuscitation.   #AKI with metabolic acidosis, resolved.  Likely prerenal related to dehydration. Resolved with IV fluids and bicarb during admission  #OSA, CPAP nightly  Morbid obesity. BMI 54. Counseling performed during admission.   #Severe deconditioning. Evaluated by PT who recommends SNF. Not CIR candidate.   #Moderate not severe protein calorie malnutrition.  Has not been eating much in the hospital and lost about 30 pounds.  Nutrition consulted and recommended appropriate supplementation.  (Premier protein)  Code Status: Full code  Family Communication: No family at bedside  Disposition Plan: Despite having improved mobility with PT they will be followed for home health therapy, patient will benefit from skilled nursing facility given she has no care givers and is previously failed outpatient recommendations for wound care (not adequate cleaning or frequent dressing changes) in the past prompting multiple readmissions.   Consultants:  None     Procedures:  None  Antimicrobials: Anti-infectives (From admission, onward)   Start     Dose/Rate Route Frequency Ordered Stop   07/09/18 0000  sulfamethoxazole-trimethoprim (BACTRIM) 400-80 MG tablet     1 tablet Oral 2 times daily 07/09/18 1619     07/07/18 1000  vancomycin (VANCOCIN) 1,500 mg in sodium chloride 0.9 % 500 mL IVPB  Status:  Discontinued     1,500 mg 250 mL/hr over 120 Minutes Intravenous Every 24 hours 07/06/18 1014 07/07/18 0938   07/07/18 1000  sulfamethoxazole-trimethoprim (  BACTRIM DS,SEPTRA DS) 800-160 MG  per tablet 2 tablet  Status:  Discontinued     2 tablet Oral Every 12 hours 07/07/18 0938 07/14/18 0734   07/06/18 1200  cefTRIAXone (ROCEPHIN) 2 g in sodium chloride 0.9 % 100 mL IVPB  Status:  Discontinued     2 g 200 mL/hr over 30 Minutes Intravenous Every 24 hours 07/06/18 0948 07/07/18 0938   07/06/18 1100  doxycycline (VIBRAMYCIN) 100 mg in sodium chloride 0.9 % 250 mL IVPB  Status:  Discontinued     100 mg 125 mL/hr over 120 Minutes Intravenous Every 12 hours 07/06/18 0915 07/06/18 1002   07/06/18 1015  vancomycin (VANCOCIN) 2,500 mg in sodium chloride 0.9 % 500 mL IVPB     2,500 mg 250 mL/hr over 120 Minutes Intravenous  Once 07/06/18 1014 07/07/18 0826        Cultures:  1 of 2 blood cultures, 07/04/2018 coag negative staph, urine culture 10/13-  Telemetry: No  DVT prophylaxis: SCDs   Objective: Vitals:   08/07/18 0056 08/07/18 0709 08/07/18 1005 08/07/18 1412  BP: (!) 119/53 (!) 101/54 (!) 91/47 (!) 103/54  Pulse:  78 88 88  Resp:  18  16  Temp:  98.1 F (36.7 C)  98.8 F (37.1 C)  TempSrc:  Oral  Oral  SpO2:  98%  94%  Weight:      Height:        Intake/Output Summary (Last 24 hours) at 08/07/2018 1834 Last data filed at 08/07/2018 0600 Gross per 24 hour  Intake 360 ml  Output -  Net 360 ml   Filed Weights   08/03/18 1022 08/04/18 0500 08/06/18 0500  Weight: 135.3 kg 133.2 kg 132.9 kg    Exam:  Constitutional: Morbidly obese female, no acute distress Eyes: Redness in L sclera, limited EOMI right eye, reports blurry vision in left eye with drainage from both eyes present ENMT: Oropharynx with moist mucous membranes Cardiovascular: RRR no MRGs, with no peripheral edema Respiratory: Normal respiratory effort, clear breath sounds  Abdomen: Soft,non-tender,  Skin: dressings in place on bilateral groin and anterior abdomen. Without skin tenting  Neurologic: Grossly no focal neuro deficit. Psychiatric:Appropriate affect, and mood. Mental status  AAOx3  Data Reviewed: CBC: No results for input(s): WBC, NEUTROABS, HGB, HCT, MCV, PLT in the last 168 hours. Basic Metabolic Panel: No results for input(s): NA, K, CL, CO2, GLUCOSE, BUN, CREATININE, CALCIUM, MG, PHOS in the last 168 hours. GFR: Estimated Creatinine Clearance: 125.2 mL/min (by C-G formula based on SCr of 0.77 mg/dL). Liver Function Tests: No results for input(s): AST, ALT, ALKPHOS, BILITOT, PROT, ALBUMIN in the last 168 hours. No results for input(s): LIPASE, AMYLASE in the last 168 hours. No results for input(s): AMMONIA in the last 168 hours. Coagulation Profile: No results for input(s): INR, PROTIME in the last 168 hours. Cardiac Enzymes: No results for input(s): CKTOTAL, CKMB, CKMBINDEX, TROPONINI in the last 168 hours. BNP (last 3 results) No results for input(s): PROBNP in the last 8760 hours. HbA1C: No results for input(s): HGBA1C in the last 72 hours. CBG: No results for input(s): GLUCAP in the last 168 hours. Lipid Profile: No results for input(s): CHOL, HDL, LDLCALC, TRIG, CHOLHDL, LDLDIRECT in the last 72 hours. Thyroid Function Tests: No results for input(s): TSH, T4TOTAL, FREET4, T3FREE, THYROIDAB in the last 72 hours. Anemia Panel: No results for input(s): VITAMINB12, FOLATE, FERRITIN, TIBC, IRON, RETICCTPCT in the last 72 hours. Urine analysis:    Component Value Date/Time  COLORURINE YELLOW 07/05/2018 [place] 07/05/2018 0511   LABSPEC 1.020 07/05/2018 0511   PHURINE 5.0 07/05/2018 0511   GLUCOSEU NEGATIVE 07/05/2018 0511   HGBUR NEGATIVE 07/05/2018 0511   BILIRUBINUR NEGATIVE 07/05/2018 0511   KETONESUR 5 (A) 07/05/2018 0511   PROTEINUR 30 (A) 07/05/2018 0511   UROBILINOGEN 0.2 07/22/2015 0405   NITRITE NEGATIVE 07/05/2018 0511   LEUKOCYTESUR NEGATIVE 07/05/2018 0511   Sepsis Labs: @LABRCNTIP (procalcitonin:4,lacticidven:4)  )No results found for this or any previous visit (from the past 240 hour(s)).     Studies: No results found.  Scheduled Meds: . acetaminophen  650 mg Oral Q6H  . ferrous gluconate  324 mg Oral BID WC  . metoprolol tartrate  50 mg Oral BID  . multivitamin with minerals  1 tablet Oral Daily  . naphazoline-glycerin  1 drop Both Eyes TID WC & HS    Continuous Infusions: . sodium chloride Stopped (07/23/18 1100)     LOS: 32 days     Desiree Hane, MD [facility] Pager [phone]  If 7PM-7AM, please contact night-coverage [email or web address] Password [facility] 08/07/2018, 6:34 PM

## 2018-08-07 NOTE — Progress Notes (Signed)
PT Cancellation Note  Patient Details Name: Dominique [name] MRN: [id number] DOB: [birth year]   Cancelled Treatment:    Reason Eval/Treat Not Completed: Patient declined, no reason specified Pt declines to mobilize at this time stating she is waiting on pain meds and to take a shower.  Pt encouraged to mobilize prior to bathing since she requested PT check back with her today, and she stated her vision was not good and again declined to participate.   Belvin Gauss,KATHrine E 08/07/2018, 3:12 PM Carmelia Bake, PT, DPT Acute Rehabilitation Services Office: [phone] Pager: [phone]

## 2018-08-07 NOTE — Progress Notes (Signed)
OT Cancellation Note  Patient Details Name: Dominique [name] MRN: [id number] DOB: [birth year]   Cancelled Treatment:    Reason Eval/Treat Not Completed: Other (comment).  Pt just woke up and eyes are blurry.  I am unable to return today. Will check back another day.  Abbegayle Denault 08/07/2018, 9:51 AM  [name], OTR/L Acute Rehabilitation Services [phone] [facility] pager [phone] office 08/07/2018

## 2018-08-08 NOTE — Progress Notes (Signed)
Patient has been encouraged multiple times throughout this shift by this RN and NT Olivia to ambulate, perform hygiene or even order a meal. The patient requested to defer these activities until a later time each time the request is presented. Patient has been informed of pain medicine schedule, this RN's desire to perform wound care, and that we are available to her at any time she needs assistance with any of her ADLs.

## 2018-08-08 NOTE — Progress Notes (Signed)
PROGRESS NOTE  Dominique [name] OAC:[id number] DOB: 08/23/1981 DOA: 07/04/2018 PCP: Benito Mccreedy, MD  HPI/Brief Narrative  Dominique [name] is a 37 y.o. year old female with medical history significant for Crohn's disease s/p  colectomy (2016) with colostomy, multiple peri-inguinal and peri-gluteal wounds  who presented on 07/04/2018 with recurrent nausea and vomiting with lightheadedness while standing and was found to have sepsis secondary to cellulitis from chronic periinguinal/perigluteal wounds and orthostatic hypotension which are now all resolved. Currently awaiting placement in SNF  Subjective NO acute complaints.   Assessment/Plan:  #Multiple intertriginous wounds (abdomen, peringuinal and perigluteal), stable.  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  Continuously clean with no overt signs of infection.  Patient with a 7-day course of Bactrim during hospital stay.  Currently awaiting placement at skilled nursing facility as patient has failed outpatient wound care requiring 3 readmissions for similar presentation.  Judicious pain control oxycodone 15 mg q4H PRN every 4H as needed for severe,  scheduled tylenol, tramadol for moderate pain PRN, breakthrough IV Dilaudid q 4 H PRN. Followed at [facility] for GI, wound nursing recommends reevaluation and exploration for other options on discharge  #Crohn's disease, status post colectomy with colostomy (2016), stable no active signs of intraintestinal disease.  #Bilateral eye redness last evaluated by ophthalmology on inpatient admission and 03/2018 at that time was started on Maxitrol and Zirgan drops. Still persistently red. Will monitor over weekend given this is a chronic problem and consult ophthalmology on 11/18 as she has not had outpatient f/u due to constant hospitalizations. Want to ensure no other pertinent treatment necessary Only vision in left eye( no change)   Currently on clear eyes drops previously on tobramycin  drops during admission  #Chronic anemia, likely anemia of chronic disease (crohn's) and some blood loss from wounds.  Did receive transfusion during hospitalization. Stable at baseline, no signs or symptoms of bleeding.  Monitor CBC intermittently. Continue oral iron  #Bilateral knee wants and swelling, stable.  Likely secondary to OA.  X-ray is unrevealing consistent with OA.  Continue to monitor pain control as above  #Sinus Tachycardia. Stable. Continue lopressor 50 mg BID  #Orthostatic hypotension. Resolved. Hypovolemic on admission in setting of sepsis and loss from wounds. Improved with fluid resuscitation.   #AKI with metabolic acidosis, resolved.  Likely prerenal related to dehydration. Resolved with IV fluids and bicarb during admission  #OSA, CPAP nightly  Morbid obesity. BMI 54. Counseling performed during admission.   #Severe deconditioning. Evaluated by PT who recommends SNF. Not CIR candidate.   #Moderate not severe protein calorie malnutrition.  Has not been eating much in the hospital and lost about 30 pounds.  Nutrition consulted and recommended appropriate supplementation.  (Premier protein)  Code Status: Full code  Family Communication: No family at bedside  Disposition Plan: Despite having improved mobility with PT they will be followed for home health therapy, patient will benefit from skilled nursing facility given she has no care givers and is previously failed outpatient recommendations for wound care (not adequate cleaning or frequent dressing changes) in the past prompting multiple readmissions.   Consultants:  None     Procedures:  None  Antimicrobials: Anti-infectives (From admission, onward)   Start     Dose/Rate Route Frequency Ordered Stop   07/09/18 0000  sulfamethoxazole-trimethoprim (BACTRIM) 400-80 MG tablet     1 tablet Oral 2 times daily 07/09/18 1619     07/07/18 1000  vancomycin (VANCOCIN) 1,500 mg in sodium chloride 0.9 % 500 mL  IVPB   Status:  Discontinued     1,500 mg 250 mL/hr over 120 Minutes Intravenous Every 24 hours 07/06/18 1014 07/07/18 0938   07/07/18 1000  sulfamethoxazole-trimethoprim (BACTRIM DS,SEPTRA DS) 800-160 MG per tablet 2 tablet  Status:  Discontinued     2 tablet Oral Every 12 hours 07/07/18 0938 07/14/18 0734   07/06/18 1200  cefTRIAXone (ROCEPHIN) 2 g in sodium chloride 0.9 % 100 mL IVPB  Status:  Discontinued     2 g 200 mL/hr over 30 Minutes Intravenous Every 24 hours 07/06/18 0948 07/07/18 0938   07/06/18 1100  doxycycline (VIBRAMYCIN) 100 mg in sodium chloride 0.9 % 250 mL IVPB  Status:  Discontinued     100 mg 125 mL/hr over 120 Minutes Intravenous Every 12 hours 07/06/18 0915 07/06/18 1002   07/06/18 1015  vancomycin (VANCOCIN) 2,500 mg in sodium chloride 0.9 % 500 mL IVPB     2,500 mg 250 mL/hr over 120 Minutes Intravenous  Once 07/06/18 1014 07/07/18 0826        Cultures:  1 of 2 blood cultures, 07/04/2018 coag negative staph, urine culture 10/13-  Telemetry: No  DVT prophylaxis: SCDs   Objective: Vitals:   08/08/18 0757 08/08/18 1028 08/08/18 1357 08/08/18 1619  BP:  (!) 96/51 (!) 105/57 (!) 105/58  Pulse:  82 90 94  Resp:   18   Temp:   98.2 F (36.8 C)   TempSrc:   Oral   SpO2: 95%  100%   Weight:      Height:        Intake/Output Summary (Last 24 hours) at 08/08/2018 1643 Last data filed at 08/08/2018 0054 Gross per 24 hour  Intake -  Output 1 ml  Net -1 ml   Filed Weights   08/04/18 0500 08/06/18 0500  Weight: 133.2 kg 132.9 kg    Exam:  Constitutional: Morbidly obese female, lying in bed comfortably Eyes: Redness in bilateral sclera, limited EOMI right eye, reports blurry vision in left eye with drainage from both eyes present ENMT: Oropharynx with moist mucous membranes Cardiovascular: RRR no MRGs, with no peripheral edema Respiratory: Normal respiratory effort, clear breath sounds  Abdomen: Soft,non-tender,  Skin: dressings in place on bilateral  groin and anterior abdomen. Without skin tenting  Neurologic: Grossly no focal neuro deficit. Psychiatric:Appropriate affect, and mood. Mental status AAOx3  Data Reviewed: CBC: No results for input(s): WBC, NEUTROABS, HGB, HCT, MCV, PLT in the last 168 hours. Basic Metabolic Panel: No results for input(s): NA, K, CL, CO2, GLUCOSE, BUN, CREATININE, CALCIUM, MG, PHOS in the last 168 hours. GFR: Estimated Creatinine Clearance: 125.2 mL/min (by C-G formula based on SCr of 0.77 mg/dL). Liver Function Tests: No results for input(s): AST, ALT, ALKPHOS, BILITOT, PROT, ALBUMIN in the last 168 hours. No results for input(s): LIPASE, AMYLASE in the last 168 hours. No results for input(s): AMMONIA in the last 168 hours. Coagulation Profile: No results for input(s): INR, PROTIME in the last 168 hours. Cardiac Enzymes: No results for input(s): CKTOTAL, CKMB, CKMBINDEX, TROPONINI in the last 168 hours. BNP (last 3 results) No results for input(s): PROBNP in the last 8760 hours. HbA1C: No results for input(s): HGBA1C in the last 72 hours. CBG: No results for input(s): GLUCAP in the last 168 hours. Lipid Profile: No results for input(s): CHOL, HDL, LDLCALC, TRIG, CHOLHDL, LDLDIRECT in the last 72 hours. Thyroid Function Tests: No results for input(s): TSH, T4TOTAL, FREET4, T3FREE, THYROIDAB in the last 72 hours. Anemia Panel: No  results for input(s): VITAMINB12, FOLATE, FERRITIN, TIBC, IRON, RETICCTPCT in the last 72 hours. Urine analysis:    Component Value Date/Time   COLORURINE YELLOW 07/05/2018 0511   APPEARANCEUR CLEAR 07/05/2018 0511   LABSPEC 1.020 07/05/2018 0511   PHURINE 5.0 07/05/2018 0511   GLUCOSEU NEGATIVE 07/05/2018 0511   HGBUR NEGATIVE 07/05/2018 0511   BILIRUBINUR NEGATIVE 07/05/2018 0511   KETONESUR 5 (A) 07/05/2018 0511   PROTEINUR 30 (A) 07/05/2018 0511   UROBILINOGEN 0.2 07/22/2015 0405   NITRITE NEGATIVE 07/05/2018 0511   LEUKOCYTESUR NEGATIVE 07/05/2018 0511    Sepsis Labs: @LABRCNTIP (procalcitonin:4,lacticidven:4)  )No results found for this or any previous visit (from the past 240 hour(s)).    Studies: No results found.  Scheduled Meds: . acetaminophen  650 mg Oral Q6H  . ferrous gluconate  324 mg Oral BID WC  . metoprolol tartrate  50 mg Oral BID  . multivitamin with minerals  1 tablet Oral Daily  . naphazoline-glycerin  1 drop Both Eyes TID WC & HS    Continuous Infusions: . sodium chloride Stopped (07/23/18 1100)     LOS: 33 days     Desiree Hane, MD [facility] Pager [phone]  If 7PM-7AM, please contact night-coverage [email or web address] Password [facility] 08/08/2018, 4:43 PM

## 2018-08-09 MED ORDER — POLYETHYLENE GLYCOL 3350 17 G PO PACK: 17.0000 g | PACK | Freq: Every day | ORAL | Status: DC | PRN

## 2018-08-09 NOTE — Progress Notes (Signed)
PROGRESS NOTE  Dominique [name] ZOX:[id number] DOB: Jun 04, 1981 DOA: 07/04/2018 PCP: Benito Mccreedy, MD  HPI/Brief Narrative  Dominique [name] is a 37 y.o. year old female with medical history significant for Crohn's disease s/p  colectomy (2016) with colostomy, multiple peri-inguinal and peri-gluteal wounds  who presented on 07/04/2018 with recurrent nausea and vomiting with lightheadedness while standing and was found to have sepsis secondary to cellulitis from chronic periinguinal/perigluteal wounds and orthostatic hypotension which are now all resolved. Currently awaiting placement in SNF  Subjective Has headache, improving with ice pack and pain meds. Asked to liberalize diet from heart healthy to regular  Assessment/Plan:  #Multiple intertriginous wounds (abdomen, peringuinal and perigluteal), stable.  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  Continuously clean with no overt signs of infection.  Patient with a 7-day course of Bactrim during hospital stay.  Currently awaiting placement at skilled nursing facility as patient has failed outpatient wound care requiring 3 readmissions for similar presentation.  Judicious pain control oxycodone 15 mg q4H PRN every 4H as needed for severe,  scheduled tylenol, tramadol for moderate pain PRN, breakthrough IV Dilaudid q 4 H PRN. Followed at [facility] for GI, wound nursing recommends reevaluation and exploration for other options on discharge  #Crohn's disease, status post colectomy with colostomy (2016), stable no active signs of intraintestinal disease.  #Bilateral eye redness last evaluated by ophthalmology on inpatient admission and 03/2018 at that time was started on Maxitrol and Zirgan drops. Still persistently red. Will monitor over weekend given this is a chronic problem and consult ophthalmology on 11/18 as she has not had outpatient f/u due to constant hospitalizations. Want to ensure no other pertinent treatment necessary. Only  vision in left eye( no change)   Currently on clear eyes drops previously on tobramycin drops during admission  #Chronic anemia, likely anemia of chronic disease (crohn's) and some blood loss from wounds.  Did receive transfusion during hospitalization. Stable at baseline, no signs or symptoms of bleeding.  Monitor CBC intermittently. Continue oral iron  #Bilateral knee wants and swelling, stable.  Likely secondary to OA.  X-ray is unrevealing consistent with OA.  Continue to monitor pain control as above  #Sinus Tachycardia. Stable. Continue lopressor 50 mg BID  #Orthostatic hypotension. Resolved. Hypovolemic on admission in setting of sepsis and loss from wounds. Improved with fluid resuscitation.   #AKI with metabolic acidosis, resolved.  Likely prerenal related to dehydration. Resolved with IV fluids and bicarb during admission  #OSA, CPAP nightly  Morbid obesity. BMI 54. Counseling performed during admission.   #Severe deconditioning. Evaluated by PT who recommends SNF. Not CIR candidate.   #Moderate not severe protein calorie malnutrition.  Has not been eating much in the hospital and lost about 30 pounds.  Nutrition consulted and recommended appropriate supplementation.  (Premier protein)  Code Status: Full code  Family Communication: No family at bedside  Disposition Plan: Despite having improved mobility with PT they will be followed for home health therapy, patient will benefit from skilled nursing facility given she has no care givers and is previously failed outpatient recommendations for wound care (not adequate cleaning or frequent dressing changes) in the past prompting multiple readmissions.   Consultants:  None     Procedures:  None  Antimicrobials: Anti-infectives (From admission, onward)   Start     Dose/Rate Route Frequency Ordered Stop   07/09/18 0000  sulfamethoxazole-trimethoprim (BACTRIM) 400-80 MG tablet     1 tablet Oral 2 times daily 07/09/18 1619  07/07/18 1000  vancomycin (VANCOCIN) 1,500 mg in sodium chloride 0.9 % 500 mL IVPB  Status:  Discontinued     1,500 mg 250 mL/hr over 120 Minutes Intravenous Every 24 hours 07/06/18 1014 07/07/18 0938   07/07/18 1000  sulfamethoxazole-trimethoprim (BACTRIM DS,SEPTRA DS) 800-160 MG per tablet 2 tablet  Status:  Discontinued     2 tablet Oral Every 12 hours 07/07/18 0938 07/14/18 0734   07/06/18 1200  cefTRIAXone (ROCEPHIN) 2 g in sodium chloride 0.9 % 100 mL IVPB  Status:  Discontinued     2 g 200 mL/hr over 30 Minutes Intravenous Every 24 hours 07/06/18 0948 07/07/18 0938   07/06/18 1100  doxycycline (VIBRAMYCIN) 100 mg in sodium chloride 0.9 % 250 mL IVPB  Status:  Discontinued     100 mg 125 mL/hr over 120 Minutes Intravenous Every 12 hours 07/06/18 0915 07/06/18 1002   07/06/18 1015  vancomycin (VANCOCIN) 2,500 mg in sodium chloride 0.9 % 500 mL IVPB     2,500 mg 250 mL/hr over 120 Minutes Intravenous  Once 07/06/18 1014 07/07/18 0826        Cultures:  1 of 2 blood cultures, 07/04/2018 coag negative staph, urine culture 10/13-  Telemetry: No  DVT prophylaxis: SCDs   Objective: Vitals:   08/08/18 1619 08/08/18 2049 08/09/18 0352 08/09/18 1342  BP: (!) 105/58 114/66 118/63 113/60  Pulse: 94 92 83 75  Resp:  18  18  Temp:  98.3 F (36.8 C)  97.6 F (36.4 C)  TempSrc:  Oral  Oral  SpO2:  99%  94%  Weight:      Height:        Intake/Output Summary (Last 24 hours) at 08/09/2018 1411 Last data filed at 08/09/2018 1358 Gross per 24 hour  Intake 360 ml  Output 0 ml  Net 360 ml   Filed Weights   08/04/18 0500 08/06/18 0500  Weight: 133.2 kg 132.9 kg    Exam:  Constitutional: Morbidly obese female, lying in bed comfortably Eyes: Redness in bilateral sclera, limited EOMI right eye, reports blurry vision in left eye with drainage from both eyes present Cardiovascular: RRR no MRGs, with no peripheral edema Respiratory: Normal respiratory effort, clear breath  sounds  Abdomen: Soft,non-tender,  Neurologic: Grossly no focal neuro deficit. Psychiatric:flat affect. Mental status AAOx3  Data Reviewed: CBC: No results for input(s): WBC, NEUTROABS, HGB, HCT, MCV, PLT in the last 168 hours. Basic Metabolic Panel: No results for input(s): NA, K, CL, CO2, GLUCOSE, BUN, CREATININE, CALCIUM, MG, PHOS in the last 168 hours. GFR: Estimated Creatinine Clearance: 125.2 mL/min (by C-G formula based on SCr of 0.77 mg/dL). Liver Function Tests: No results for input(s): AST, ALT, ALKPHOS, BILITOT, PROT, ALBUMIN in the last 168 hours. No results for input(s): LIPASE, AMYLASE in the last 168 hours. No results for input(s): AMMONIA in the last 168 hours. Coagulation Profile: No results for input(s): INR, PROTIME in the last 168 hours. Cardiac Enzymes: No results for input(s): CKTOTAL, CKMB, CKMBINDEX, TROPONINI in the last 168 hours. BNP (last 3 results) No results for input(s): PROBNP in the last 8760 hours. HbA1C: No results for input(s): HGBA1C in the last 72 hours. CBG: No results for input(s): GLUCAP in the last 168 hours. Lipid Profile: No results for input(s): CHOL, HDL, LDLCALC, TRIG, CHOLHDL, LDLDIRECT in the last 72 hours. Thyroid Function Tests: No results for input(s): TSH, T4TOTAL, FREET4, T3FREE, THYROIDAB in the last 72 hours. Anemia Panel: No results for input(s): VITAMINB12, FOLATE, FERRITIN, TIBC,  IRON, RETICCTPCT in the last 72 hours. Urine analysis:    Component Value Date/Time   COLORURINE YELLOW 07/05/2018 0511   APPEARANCEUR CLEAR 07/05/2018 0511   LABSPEC 1.020 07/05/2018 0511   PHURINE 5.0 07/05/2018 0511   GLUCOSEU NEGATIVE 07/05/2018 0511   HGBUR NEGATIVE 07/05/2018 0511   BILIRUBINUR NEGATIVE 07/05/2018 0511   KETONESUR 5 (A) 07/05/2018 0511   PROTEINUR 30 (A) 07/05/2018 0511   UROBILINOGEN 0.2 07/22/2015 0405   NITRITE NEGATIVE 07/05/2018 0511   LEUKOCYTESUR NEGATIVE 07/05/2018 0511   Sepsis  Labs: @LABRCNTIP (procalcitonin:4,lacticidven:4)  )No results found for this or any previous visit (from the past 240 hour(s)).    Studies: No results found.  Scheduled Meds: . acetaminophen  650 mg Oral Q6H  . ferrous gluconate  324 mg Oral BID WC  . metoprolol tartrate  50 mg Oral BID  . multivitamin with minerals  1 tablet Oral Daily  . naphazoline-glycerin  1 drop Both Eyes TID WC & HS    Continuous Infusions: . sodium chloride Stopped (07/23/18 1100)     LOS: 34 days     Desiree Hane, MD [facility] Pager [phone]  If 7PM-7AM, please contact night-coverage [email or web address] Password [email or web address] 08/09/2018, 2:11 PM

## 2018-08-10 DIAGNOSIS — H5789 Other specified disorders of eye and adnexa: Secondary | ICD-10-CM

## 2018-08-10 LAB — CBC
HCT: 37.9 % (ref 36.0–46.0)
Hemoglobin: 11.4 g/dL — ABNORMAL LOW (ref 12.0–15.0)
MCH: 25.9 pg — ABNORMAL LOW (ref 26.0–34.0)
MCHC: 30.1 g/dL (ref 30.0–36.0)
MCV: 85.9 fL (ref 80.0–100.0)
PLATELETS: 335 10*3/uL (ref 150–400)
RBC: 4.41 MIL/uL (ref 3.87–5.11)
RDW: 17.9 % — AB (ref 11.5–15.5)
WBC: 10 10*3/uL (ref 4.0–10.5)
nRBC: 0 % (ref 0.0–0.2)

## 2018-08-10 LAB — BASIC METABOLIC PANEL
Anion gap: 8 (ref 5–15)
BUN: 8 mg/dL (ref 6–20)
CALCIUM: 8.5 mg/dL — AB (ref 8.9–10.3)
CO2: 23 mmol/L (ref 22–32)
CREATININE: 0.84 mg/dL (ref 0.44–1.00)
Chloride: 107 mmol/L (ref 98–111)
GFR calc non Af Amer: >60 mL/min (ref >60)
Glucose, Bld: 81 mg/dL (ref 70–99)
Potassium: 4 mmol/L (ref 3.5–5.1)
SODIUM: 138 mmol/L (ref 135–145)

## 2018-08-10 NOTE — Progress Notes (Signed)
PROGRESS NOTE  Dominique [name] JFH:[id number] DOB: 1981-09-04 DOA: 07/04/2018 PCP: Benito Mccreedy, MD  HPI/Brief Narrative  Dominique [name] is a 37 y.o. year old female with medical history significant for Crohn's disease s/p  colectomy (2016) with colostomy, multiple peri-inguinal and peri-gluteal wounds  who presented on 07/04/2018 with recurrent nausea and vomiting with lightheadedness while standing and was found to have sepsis secondary to cellulitis from chronic periinguinal/perigluteal wounds and orthostatic hypotension which are now all resolved. Currently awaiting placement in SNF  Subjective Still mobilizing mostly at night which is also when she has most of her pain.  Assessment/Plan:  #Multiple intertriginous wounds (abdomen, peringuinal and perigluteal), stable.  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  Currently clean with no overt signs of infection.  Patient completed a 7-day course of Bactrim during hospital stay.  Currently awaiting placement at skilled nursing facility as patient has failed outpatient wound care requiring 3 readmissions for similar presentation.  Judicious pain control oxycodone 15 mg q4H PRN every 4H as needed for severe,  scheduled tylenol, tramadol for moderate pain PRN, breakthrough IV Dilaudid q 4 H PRN. Followed at [facility] for GI, wound nursing recommends reevaluation and exploration for other options on discharge  #Crohn's disease, status post colectomy with colostomy (2016), stable no active signs of intraintestinal disease.  #Bilateral eye redness last evaluated by ophthalmology on inpatient admission and 03/2018 at that time was started on Maxitrol and Zirgan drops. Still persistently red. This is a chronic problem but I would like to consult ophthalmology  as she has not had outpatient f/u due to constant hospitalizations. Want to ensure no other pertinent treatment necessary. Only vision in left eye( no change)   Currently on clear  eyes drops previously on tobramycin drops during admission  #Chronic anemia, likely anemia of chronic disease (crohn's) and some blood loss from wounds.  Did receive transfusion during hospitalization. Stable at baseline, no signs or symptoms of bleeding.  Monitor CBC intermittently. Continue oral iron  #Bilateral knee wants and swelling, stable.  Likely secondary to OA.  X-ray is unrevealing consistent with OA.  Continue to monitor pain control as above  #Sinus Tachycardia. Stable. Continue lopressor 50 mg BID  #Orthostatic hypotension. Resolved. Hypovolemic on admission in setting of sepsis and loss from wounds. Improved with fluid resuscitation.   #AKI with metabolic acidosis, resolved.  Likely prerenal related to dehydration. Resolved with IV fluids and bicarb during admission  #OSA, CPAP nightly  Morbid obesity. BMI 54. Counseling performed during admission.   #Severe deconditioning. Evaluated by PT who recommends SNF. Not CIR candidate.   #Moderate not severe protein calorie malnutrition.  Has not been eating much in the hospital and lost about 30 pounds.  Nutrition consulted and recommended appropriate supplementation.  (Premier protein)  Code Status: Full code  Family Communication: No family at bedside  Disposition Plan: Despite having improved mobility with PT they will be followed for home health therapy, patient will benefit from skilled nursing facility given she has no care givers and is previously failed outpatient recommendations for wound care (not adequate cleaning or frequent dressing changes) in the past prompting multiple readmissions.   Consultants:  None     Procedures:  None  Antimicrobials: Anti-infectives (From admission, onward)   Start     Dose/Rate Route Frequency Ordered Stop   07/09/18 0000  sulfamethoxazole-trimethoprim (BACTRIM) 400-80 MG tablet     1 tablet Oral 2 times daily 07/09/18 1619     07/07/18 1000  vancomycin (VANCOCIN)  1,500 mg in  sodium chloride 0.9 % 500 mL IVPB  Status:  Discontinued     1,500 mg 250 mL/hr over 120 Minutes Intravenous Every 24 hours 07/06/18 1014 07/07/18 0938   07/07/18 1000  sulfamethoxazole-trimethoprim (BACTRIM DS,SEPTRA DS) 800-160 MG per tablet 2 tablet  Status:  Discontinued     2 tablet Oral Every 12 hours 07/07/18 0938 07/14/18 0734   07/06/18 1200  cefTRIAXone (ROCEPHIN) 2 g in sodium chloride 0.9 % 100 mL IVPB  Status:  Discontinued     2 g 200 mL/hr over 30 Minutes Intravenous Every 24 hours 07/06/18 0948 07/07/18 0938   07/06/18 1100  doxycycline (VIBRAMYCIN) 100 mg in sodium chloride 0.9 % 250 mL IVPB  Status:  Discontinued     100 mg 125 mL/hr over 120 Minutes Intravenous Every 12 hours 07/06/18 0915 07/06/18 1002   07/06/18 1015  vancomycin (VANCOCIN) 2,500 mg in sodium chloride 0.9 % 500 mL IVPB     2,500 mg 250 mL/hr over 120 Minutes Intravenous  Once 07/06/18 1014 07/07/18 0826        Cultures:  1 of 2 blood cultures, 07/04/2018 coag negative staph, urine culture 10/13-  Telemetry: No  DVT prophylaxis: SCDs   Objective: Vitals:   08/09/18 2100 08/09/18 2115 08/10/18 0658 08/10/18 1403  BP: (!) 100/56  (!) 120/57 (!) 101/57  Pulse:  79 90 84  Resp:   18 16  Temp:  98.4 F (36.9 C) 98.2 F (36.8 C) 98.7 F (37.1 C)  TempSrc:  Oral Oral Oral  SpO2:  95% 96% 90%  Weight:      Height:       No intake or output data in the 24 hours ending 08/10/18 1726 Filed Weights   08/04/18 0500 08/06/18 0500  Weight: 133.2 kg 132.9 kg    Exam:  Constitutional: Morbidly obese female, lying in bed comfortably Eyes: Redness in bilateral sclera, limited EOMI right eye, reports blurry vision in left eye with drainage from both eyes present Cardiovascular: RRR no MRGs, with no peripheral edema Respiratory: Normal respiratory effort, clear breath sounds  Abdomen: Soft,non-tender,  Neurologic: Grossly no focal neuro deficit. Psychiatric:flat affect. Mental status  AAOx3  Data Reviewed: CBC: Recent Labs  Lab 08/10/18 0601  WBC 10.0  HGB 11.4*  HCT 37.9  MCV 85.9  PLT 086   Basic Metabolic Panel: Recent Labs  Lab 08/10/18 0601  NA 138  K 4.0  CL 107  CO2 23  GLUCOSE 81  BUN 8  CREATININE 0.84  CALCIUM 8.5*   GFR: Estimated Creatinine Clearance: 119.3 mL/min (by C-G formula based on SCr of 0.84 mg/dL). Liver Function Tests: No results for input(s): AST, ALT, ALKPHOS, BILITOT, PROT, ALBUMIN in the last 168 hours. No results for input(s): LIPASE, AMYLASE in the last 168 hours. No results for input(s): AMMONIA in the last 168 hours. Coagulation Profile: No results for input(s): INR, PROTIME in the last 168 hours. Cardiac Enzymes: No results for input(s): CKTOTAL, CKMB, CKMBINDEX, TROPONINI in the last 168 hours. BNP (last 3 results) No results for input(s): PROBNP in the last 8760 hours. HbA1C: No results for input(s): HGBA1C in the last 72 hours. CBG: No results for input(s): GLUCAP in the last 168 hours. Lipid Profile: No results for input(s): CHOL, HDL, LDLCALC, TRIG, CHOLHDL, LDLDIRECT in the last 72 hours. Thyroid Function Tests: No results for input(s): TSH, T4TOTAL, FREET4, T3FREE, THYROIDAB in the last 72 hours. Anemia Panel: No results for input(s): VITAMINB12, FOLATE, FERRITIN,  TIBC, IRON, RETICCTPCT in the last 72 hours. Urine analysis:    Component Value Date/Time   COLORURINE YELLOW 07/05/2018 0511   APPEARANCEUR CLEAR 07/05/2018 0511   LABSPEC 1.020 07/05/2018 0511   PHURINE 5.0 07/05/2018 0511   GLUCOSEU NEGATIVE 07/05/2018 0511   HGBUR NEGATIVE 07/05/2018 0511   BILIRUBINUR NEGATIVE 07/05/2018 0511   KETONESUR 5 (A) 07/05/2018 0511   PROTEINUR 30 (A) 07/05/2018 0511   UROBILINOGEN 0.2 07/22/2015 0405   NITRITE NEGATIVE 07/05/2018 0511   LEUKOCYTESUR NEGATIVE 07/05/2018 0511   Sepsis Labs: @LABRCNTIP (procalcitonin:4,lacticidven:4)  )No results found for this or any previous visit (from the past 240  hour(s)).    Studies: No results found.  Scheduled Meds: . acetaminophen  650 mg Oral Q6H  . ferrous gluconate  324 mg Oral BID WC  . metoprolol tartrate  50 mg Oral BID  . multivitamin with minerals  1 tablet Oral Daily  . naphazoline-glycerin  1 drop Both Eyes TID WC & HS    Continuous Infusions: . sodium chloride Stopped (07/23/18 1100)     LOS: 35 days     Desiree Hane, MD [facility] Pager [phone]  If 7PM-7AM, please contact night-coverage [email or web address] Password [facility] 08/10/2018, 5:26 PM

## 2018-08-11 MED ORDER — HYPROMELLOSE (GONIOSCOPIC) 2.5 % OP SOLN
1.0000 [drp] | Freq: Three times a day (TID) | OPHTHALMIC | Status: DC
  Filled 2018-08-11: qty 15

## 2018-08-11 MED ORDER — ERYTHROMYCIN 5 MG/GM OP OINT
TOPICAL_OINTMENT | Freq: Four times a day (QID) | OPHTHALMIC | Status: DC
  Administered 2018-08-12 – 2018-08-16 (×15): via OPHTHALMIC
  Filled 2018-08-11: qty 1
  Filled 2018-08-11: qty 3.5

## 2018-08-11 MED ORDER — POLYVINYL ALCOHOL 1.4 % OP SOLN
1.0000 [drp] | Freq: Three times a day (TID) | OPHTHALMIC | Status: DC
  Filled 2018-08-11: qty 15

## 2018-08-11 MED ORDER — POLYMYXIN B-TRIMETHOPRIM 10000-0.1 UNIT/ML-% OP SOLN
1.0000 [drp] | OPHTHALMIC | Status: DC
  Administered 2018-08-12 – 2018-08-16 (×24): 1 [drp] via OPHTHALMIC
  Filled 2018-08-11 (×2): qty 10

## 2018-08-11 NOTE — Progress Notes (Signed)
Noted-patient medically stalbe for d/c awaiting LTC-abd/groin/vaginal wounds.[facility] looking @ patient. Post Acute Care Leadership following.

## 2018-08-11 NOTE — Progress Notes (Signed)
Nutrition Follow-up  DOCUMENTATION CODES:   Non-severe (moderate) malnutrition in context of chronic illness, Morbid obesity  INTERVENTION:  - Continue Magic Cup once/day. - Continue to encourage PO intakes.   NUTRITION DIAGNOSIS:   Moderate Malnutrition related to chronic illness as evidenced by energy intake < or equal to 75% for > or equal to 1 month, percent weight loss. -ongoing  GOAL:   Patient will meet greater than or equal to 90% of their needs -unmet on average  MONITOR:   PO intake, Supplement acceptance, Weight trends, Labs, Skin  ASSESSMENT:   37 y.o. female past medical history significant for obesity, OSA, Crohn's disease s/p perforation with colostomy, chronic wound. She was in her usual health state of health until she developed recurrent nausea and vomiting several days ago, she is not lightheaded. Patient admitted with diagnosis of hypovolemia.   Weight has been stable over the past 2 weeks. Patient has been eating 1-2 meals/day and refusing the other 1-2 meals/day. She does not like/want any nutrition supplements other than Magic Cup occasionally. She typically eats 50-75% of the meals she consumes.     Medications reviewed; daily multivitamin with minerals.  Labs reviewed; Ca: 8.5 mg/dL.       Diet Order:   Diet Order            Diet regular Room service appropriate? Yes; Fluid consistency: Thin  Diet effective now        Diet - low sodium heart healthy        Diet - low sodium heart healthy              EDUCATION NEEDS:   Not appropriate for education at this time  Skin:  Skin Assessment: Skin Integrity Issues: Skin Integrity Issues:: Other (Comment) Other: per flowsheet, non-pressure wounds to: abdomen, upper and lower abdomen, L breast, bilateral groins, sacrum, and bilateral lower back.  Last BM:  11/17  Height:   Ht Readings from Last 1 Encounters:  07/04/18 5' 2"  (1.575 m)    Weight:   Wt Readings from Last 1 Encounters:   08/06/18 132.9 kg    Ideal Body Weight:  50 kg  BMI:  Body mass index is 53.59 kg/m.  Estimated Nutritional Needs:   Kcal:  2230-2510 (16-18 kcal/kg)  Protein:  135-145 grams  Fluid:  >/= 2.3 L/day     Jarome Matin, MS, RD, LDN, [facility] Inpatient Clinical Dietitian Pager # [phone] After hours/weekend pager # [phone]

## 2018-08-11 NOTE — Consult Note (Signed)
Reason for consult:  Red eyes  HPI: Dominique [name] is an 37 y.o. female.  Patient was admitted in October for a wound infection in her groin.  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  She was formerly seen by Dr. Tommy Rainwater and by Dr Edilia Bo.  THe vision in the right eye has been poor for a" long time".  At least since 2015.  The left eye has been hurting and red since she was admitted to the hospital (at least one month).  The hospital has been giving her Clear Eyes drops tid.  The eyes are very red.  The right eye does not hurt.  Left eye pain is 5 out of 10.  Vision getting worse OS. She reports she might have Chron's disease.    Past Medical History:  Diagnosis Date  . Acute acalculous cholecystitis 11/16/2014  . Anal fistula   . Anemia   . Anxiety   . Asthma   . CHF (congestive heart failure) ([place])   . Complication of anesthesia    states she had a problem staying awake after her c-section, was [facility] from [facility] to [facility]  . Coronary artery disease   . Diverticulitis of colon 11/17/2014  . Headache    used to have migraines  . Hepatic steatosis 11/17/2014  . Herpes   . Hypertension   . Morbid obesity ([place]) 03/22/2009   Qualifier: Diagnosis of  By: Ronnald Ramp CMA, [place]    . Neuropathy   . OSA (obstructive sleep apnea) 05/02/2014  . Seasonal allergies 06/13/2014  . Sickle cell anemia ([place])    She states she " has the trait" (07/20/15)  . Sleep apnea    uses Bipap  . Thyromegaly 05/02/2014   Past Surgical History:  Procedure Laterality Date  . c section 2011  2011  . COLONOSCOPY WITH PROPOFOL N/A 12/27/2015   Procedure: COLONOSCOPY WITH PROPOFOL;  Surgeon: Wonda Horner, MD;  Location: [facility] ENDOSCOPY;  Service: Endoscopy;  Laterality: N/A;  . COLOSTOMY N/A 06/11/2015   Procedure:  CREATION OF COLOSTOMY;  Surgeon: Georganna Skeans, MD;  Location: [facility];  Service: General;  Laterality: N/A;  . ESOPHAGOGASTRODUODENOSCOPY N/A 07/19/2015   Procedure: ESOPHAGOGASTRODUODENOSCOPY (EGD);  Surgeon:  Clarene Essex, MD;  Location: [facility] ENDOSCOPY;  Service: Endoscopy;  Laterality: N/A;  . FLEXIBLE SIGMOIDOSCOPY N/A 06/05/2015   Procedure: FLEXIBLE SIGMOIDOSCOPY;  Surgeon: Ronald Lobo, MD;  Location: [facility] ENDOSCOPY;  Service: Endoscopy;  Laterality: N/A;  . LAPAROTOMY N/A 06/09/2015   Procedure: EXPLORATORY LAPAROTOMY, DRAINAGE OF INTRAABDOMINAL ABSCESS, PARTIAL COLON RESECTION,  APPLICATION OF WOUND VAC;  Surgeon: Georganna Skeans, MD;  Location: [place];  Service: General;  Laterality: N/A;  . LAPAROTOMY N/A 06/11/2015   Procedure: RE-EXPLORATION OF ABDOMEN;  Surgeon: Georganna Skeans, MD;  Location: [facility] OR;  Service: General;  Laterality: N/A;   Family History  Problem Relation Age of Onset  . Hypertension Mother   . Allergies Mother   . Hypertension Father   . Cancer Paternal Grandmother        lung  . Hypertension Sister   . Asthma Brother   . Diabetes Maternal Grandmother   . Hypertension Maternal Grandmother   . Hypertension Maternal Grandfather   . Emphysema Paternal Grandfather    Current Facility-Administered Medications  Medication Dose Route Frequency Provider Last Rate Last Dose  . 0.9 %  sodium chloride infusion   Intravenous PRN Raiford Noble [place], DO   Stopped at 07/23/18 1100  . acetaminophen (TYLENOL) tablet 650 mg  650 mg  Oral Q6H Oretha Milch D, MD   650 mg at 08/11/18 1750  . albuterol (PROVENTIL) (2.5 MG/3ML) 0.083% nebulizer solution 2.5 mg  2.5 mg Nebulization Q6H PRN Opyd, Ilene Qua, MD      . ferrous gluconate (FERGON) tablet 324 mg  324 mg Oral BID WC Debbe Odea, MD   324 mg at 08/11/18 1750  . HYDROmorphone (DILAUDID) injection 1 mg  1 mg Intravenous Q4H PRN Raiford Noble [place], DO   1 mg at 08/11/18 1750  . loperamide (IMODIUM) capsule 2 mg  2 mg Oral PRN Charlynne Cousins, MD   2 mg at 07/13/18 0954  . metoprolol tartrate (LOPRESSOR) tablet 50 mg  50 mg Oral BID Lovey Newcomer T, NP   50 mg at 08/11/18 0833  . multivitamin with minerals tablet 1 tablet  1  tablet Oral Daily Charlynne Cousins, MD   1 tablet at 08/10/18 1030  . ondansetron (ZOFRAN) tablet 4 mg  4 mg Oral Q6H PRN Opyd, Ilene Qua, MD   4 mg at 07/28/18 1151   Or  . ondansetron (ZOFRAN) injection 4 mg  4 mg Intravenous Q6H PRN Opyd, Ilene Qua, MD   4 mg at 08/10/18 1926  . oxyCODONE (Oxy IR/ROXICODONE) immediate release tablet 15 mg  15 mg Oral Q4H PRN Eugenie Filler, MD   15 mg at 08/11/18 1438  . polyethylene glycol (MIRALAX / GLYCOLAX) packet 17 g  17 g Oral Daily PRN Oretha Milch D, MD      . polyvinyl alcohol (LIQUIFILM TEARS) 1.4 % ophthalmic solution 1 drop  1 drop Right Eye PRN Raiford Noble [place], DO   1 drop at 07/20/18 1020  . polyvinyl alcohol (LIQUIFILM TEARS) 1.4 % ophthalmic solution 1 drop  1 drop Both Eyes TID Oretha Milch D, MD      . senna-docusate (Senokot-S) tablet 1 tablet  1 tablet Oral QHS PRN Opyd, Ilene Qua, MD      . tiZANidine (ZANAFLEX) tablet 4 mg  4 mg Oral Q8H PRN Opyd, Ilene Qua, MD   4 mg at 08/09/18 0355  . traMADol (ULTRAM) tablet 50 mg  50 mg Oral Q6H PRN Oretha Milch D, MD   50 mg at 08/10/18 1030   Allergies  Allergen Reactions  . Other Shortness Of Breath and Swelling    Tree nuts  . Penicillins Other (See Comments)    Unknown childhood allergy Has patient had a PCN reaction causing immediate rash, facial/tongue/throat swelling, SOB or lightheadedness with hypotension: Unknown Has patient had a PCN reaction causing severe rash involving mucus membranes or skin necrosis: Unknown Has patient had a PCN reaction that required hospitalization: Unknown Has patient had a PCN reaction occurring within the last 10 years: Unknown If all of the above answers are "NO", then may proceed with Cephalosporin use.    Social History   Socioeconomic History  . Marital status: Single    Spouse name: Not on file  . Number of children: Not on file  . Years of education: Not on file  . Highest education level: Not on file  Occupational History   . Not on file  Social Needs  . Financial resource strain: Not on file  . Food insecurity:    Worry: Not on file    Inability: Not on file  . Transportation needs:    Medical: Not on file    Non-medical: Not on file  Tobacco Use  . Smoking status: Current Some Day Smoker  Packs/day: 0.50    Years: 4.00    Pack years: 2.00    Types: Cigarettes    Start date: 07/24/2014    Last attempt to quit: 05/27/2015    Years since quitting: 3.2  . Smokeless tobacco: Never Used  Substance and Sexual Activity  . Alcohol use: No    Alcohol/week: 0.0 standard drinks  . Drug use: No  . Sexual activity: Not on file  Lifestyle  . Physical activity:    Days per week: Not on file    Minutes per session: Not on file  . Stress: Not on file  Relationships  . Social connections:    Talks on phone: Not on file    Gets together: Not on file    Attends religious service: Not on file    Active member of club or organization: Not on file    Attends meetings of clubs or organizations: Not on file    Relationship status: Not on file  . Intimate partner violence:    Fear of current or ex partner: Not on file    Emotionally abused: Not on file    Physically abused: Not on file    Forced sexual activity: Not on file  Other Topics Concern  . Not on file  Social History Narrative  . Not on file    Review of systems: No chest pain, no SOB, no rashes, no joint ache, +wound infection in groin, + nausea, no abdominal pain  Physical Exam:  Blood pressure 91/65, pulse (!) 110, temperature 98.7 F (37.1 C), temperature source Oral, resp. rate 20, height 5' 2"  (1.575 m), weight 132.9 kg, SpO2 96 %.   VA [place]:  OD  Count fingers 2 feet OS count fingers 1 foot   Pupils:   OD round, reactive to light, no APD            OS round, reactive to light, no APD  IOP (T pen)  OD  15 OS  21  CVF: globally blurry OU Motility:  OD full ductions  OS full ductions  Balance/alignment:  Ortho by Luiz Ochoa   Bed side  examination:                                 OD                                       External/adnexa: periocular skin is dark                                     Lids/lashes:        Normal, no swelling, no lagophthalmos                                     Conjunctiva       1+ injection       Cornea:              Inferior-to-central scar.  Epithelium intact              AC:                     Deep, no hypopyon  Iris:                     Normal        Lens:                  Poor view                                     OS                                       External/adnexa: periocular skin with hyperpigmentation                                      Lids/lashes:        No swelling.  +weak blink, but no lagophthalmos                                     Conjunctiva        4+ injection       Cornea:              Inferior-central ulcer with thick epithelial debris                  AC:                     Deep,                                 Iris:                     Normal        Lens:                  No view       Labs/studies: Results for orders placed or performed during the hospital encounter of 07/04/18 (from the past 48 hour(s))  CBC     Status: Abnormal   Collection Time: 08/10/18  6:01 AM  Result Value Ref Range   WBC 10.0 4.0 - 10.5 K/uL   RBC 4.41 3.87 - 5.11 MIL/uL   Hemoglobin 11.4 (L) 12.0 - 15.0 g/dL   HCT 37.9 36.0 - 46.0 %   MCV 85.9 80.0 - 100.0 fL   MCH 25.9 (L) 26.0 - 34.0 pg   MCHC 30.1 30.0 - 36.0 g/dL   RDW 17.9 (H) 11.5 - 15.5 %   Platelets 335 150 - 400 K/uL   nRBC 0.0 0.0 - 0.2 %    Comment: Performed at [facility], [place] [address] [place], [place] [address]  Basic metabolic panel     Status: Abnormal   Collection Time: 08/10/18  6:01 AM  Result Value Ref Range   Sodium 138 135 - 145 mmol/L   Potassium 4.0 3.5 - 5.1 mmol/L   Chloride 107 98 - 111 mmol/L   CO2 23 22 - 32 mmol/L   Glucose, Bld 81 70  - 99 mg/dL   BUN 8 6 - 20 mg/dL   Creatinine, Ser 0.84 0.44 - 1.00  mg/dL   Calcium 8.5 (L) 8.9 - 10.3 mg/dL   GFR calc non Af Amer >60 >60 mL/min   GFR calc Af Amer >60 >60 mL/min    Comment: (NOTE) The eGFR has been calculated using the CKD EPI equation. This calculation has not been validated in all clinical situations. eGFR's persistently <60 mL/min signify possible Chronic Kidney Disease.    Anion gap 8 5 - 15    Comment: Performed at [facility], [place] [address], [place], [place] [address]   No results found.                           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  Poor prognosis.  Pt will likely need corneal transplants when medically stable.  Fortunately, she is already established with an excellent corneal specialist (Dr. Reuben Likes at [facility]).  Plan: 1. D/C Clear eyes  2.  Start polytrim ophthalmic ung 6 times a day OU.  (I want to protect OD while in hospital despite sign of active infection.  I suspect nocturnal corneal exposure.)  I will follow while in hospital.  Needs to see Dr. Susa Simmonds as soon as possible upon discharge.   All of the above information was relayed to the patient.     Follow up contact information was provided.  All questions were answered.   Manson L 08/11/2018, 9:09 PM  [facility] Ophthalmology [phone]

## 2018-08-11 NOTE — Progress Notes (Signed)
Occupational Therapy Treatment Patient Details Name: Dominique [name] MRN: [id number] DOB: 11-May-1981 Today's Date: 08/11/2018    History of present illness 37 y.o. 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 Dx of hypovolemia, sepsis, cellulitis   OT comments  Pt fatiqued today. Able to complete bathing from chair with rest breaks.  She wants to do as much as she can for herself.  Pt reports she hasn't eaten in a few days and feels weaker than usual. Reviewed goals:  They are still appropriate  Follow Up Recommendations  SNF    Equipment Recommendations  3 in 1 bedside commode    Recommendations for Other Services      Precautions / Restrictions Precautions Precautions: Other (comment) Precaution Comments: colostomy; pt denies h/o falls in past 1 year Restrictions Weight Bearing Restrictions: No       Mobility Bed Mobility               General bed mobility comments: oob  Transfers              sit to stand:  Supervision as pt feels weaker than baseline        Balance                                           ADL either performed or assessed with clinical judgement   ADL           Upper Body Bathing: Set up;Sitting   Lower Body Bathing: Minimal assistance;Sit to/from stand                         General ADL Comments: pt feels fatiqued today. she didn't feel she could walk to sink to wash up. Washed from chair, sit to stand with rest breaks as she felt overheated.  OT present when pt stood then set up for rest. checked in for needs. Pt was able to complete bathing with extra time     Vision       Perception     Praxis      Cognition Arousal/Alertness: Awake/alert Behavior During Therapy: WFL for  tasks assessed/performed Overall Cognitive Status: Within Functional Limits for tasks assessed                                          Exercises     Shoulder Instructions       General Comments      Pertinent Vitals/ Pain       Faces Pain Scale: Hurts even more Pain Location: groin with movement Pain Descriptors / Indicators: Aching Pain Intervention(s): Limited activity within patient's tolerance;Monitored during session  Home Living                                          Prior Functioning/Environment              Frequency  Min 2X/week        Progress Toward Goals  OT Goals(current goals can now be found in the care plan section)  Progress towards OT goals: (not progressing today due to fatique)  Acute Rehab OT Goals OT Goal Formulation: With patient Time For Goal Achievement: 08/25/18 Potential to Achieve Goals: Good  Plan      Co-evaluation                 AM-PAC PT "6 Clicks" Daily Activity     Outcome Measure   Help from another person eating meals?: None Help from another person taking care of personal grooming?: A Little Help from another person toileting, which includes using toliet, bedpan, or urinal?: A Little Help from another person bathing (including washing, rinsing, drying)?: A Little Help from another person to put on and taking off regular upper body clothing?: A Little Help from another person to put on and taking off regular lower body clothing?: A Little 6 Click Score: 19    End of Session    OT Visit Diagnosis: Unsteadiness on feet (R26.81);Muscle weakness (generalized) (M62.81)   Activity Tolerance Patient limited by fatigue   Patient Left in chair;with call bell/phone within reach   Nurse Communication          Time: 1332-1350 OT Time Calculation (min): 18 min  Charges: OT General Charges $OT Visit: 1 Visit OT Treatments $Self Care/Home Management : 8-22  mins  [name], OTR/L Acute Rehabilitation Services [phone] [place] pager [phone] office 08/11/2018   Dominique [name] 08/11/2018, 2:25 PM

## 2018-08-11 NOTE — Progress Notes (Addendum)
PROGRESS NOTE  Dominique [name] NMM:[id number] DOB: 15-Jan-1981 DOA: 07/04/2018 PCP: Dominique Mccreedy, MD  HPI/Brief Narrative  Dominique [name] is a 37 y.o. year old female with medical history significant for Crohn's disease s/p  colectomy (2016) with colostomy, multiple peri-inguinal and peri-gluteal wounds  who presented on 07/04/2018 with recurrent nausea and vomiting with lightheadedness while standing and was found to have sepsis secondary to cellulitis from chronic periinguinal/perigluteal wounds and orthostatic hypotension which are now all resolved. Currently awaiting placement in SNF  Subjective Still having pain with ambulation particularly at night and dressing changes  Assessment/Plan:  #Multiple intertriginous wounds (abdomen, peringuinal and perigluteal), stable.  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  Currently clean with no overt signs of infection.  Patient completed a 7-day course of Bactrim during hospital stay.  Currently awaiting placement at skilled nursing facility as patient has failed outpatient wound care requiring 3 readmissions for similar presentation.  Judicious pain control oxycodone 15 mg q4H PRN every 4H as needed for severe,  scheduled tylenol, tramadol for moderate pain PRN, breakthrough IV Dilaudid q 4 H PRN. Followed at [facility] for GI, wound nursing recommends reevaluation and exploration for other options on discharge. Reconsult wound to see other dressing recs particularly for groin area since that tends to get wet.  #Crohn's disease, status post colectomy with colostomy (2016), stable no active signs of intraintestinal disease.  #Bilateral eye redness, acute on chronic with worsening vision.   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   Bilateral eye redness and loss of vision (pictures in chart from 7/29), evaluated by inpatient ophthalmology consultation and started on Maxitrol drops and Zirgan with concern for possible uveitis versus HSV or acute hydrops OD with epi defect.  Evaluation was limited by bedside exam at the time. Did not follow as outpatient - 2nd inpt admission 9/7-9/11(crohn's flare and poorly healing wounds/cellulitis).   Bilateral eye redness and blurry vision, Instructed to continue tobramycin drops 3-4 qd with outpatient follow up with Dominique [name](discussed during admission) -3rd inpt admission 10/12-current( abdomina, perigluteal, periinguinal wounds with cellulitis).   B/l eye redness. Prior hospitalist discussed case with Dominique [name] (10/23) who recommended artificial tears--patient had reported using dirty rag to wipe eyes. And to reconsult if not improving. On chart review not started -11/5-11/19: started Clear eyes redness. Per chart review reportedly some improvement intially -11/4-11/6: tobramycin drops  -11/19: Discussed case with ophthalmology who recommended discontinuing clear eyes (redness) as this could make symptoms worse and start artificial tears, they will provide further recs as well.    #Chronic anemia, likely anemia of chronic disease (crohn's) and some blood loss from wounds.  Did receive transfusion during hospitalization. Stable at baseline, no signs or symptoms of bleeding.  Monitor CBC intermittently. Continue oral iron  #Bilateral knee pain, stable.  Likely secondary to OA.  X-ray is unrevealing consistent with OA.  Continue to monitor pain control as above  #Sinus Tachycardia, chronic. Stable. Continue lopressor 50 mg BID  #Orthostatic hypotension. Resolved. Hypovolemic on admission in setting of sepsis and loss from wounds. Improved with fluid resuscitation.   #AKI with metabolic acidosis, resolved.  Likely prerenal related to dehydration. Resolved with IV fluids and bicarb  during admission  #OSA, CPAP nightly  #Morbid obesity. BMI 54. Counseling performed during admission.   #Severe  deconditioning. Evaluated by PT who recommends SNF. Not CIR candidate.   #Moderate not severe protein calorie malnutrition.  Has not been eating much in the hospital and lost about 30 pounds.  Nutrition consulted and recommended appropriate supplementation.  (Premier protein)  Code Status: Full code  Family Communication: No family at bedside  Disposition Plan: Despite having improved mobility with PT they will be followed for home health therapy, patient will benefit from skilled nursing facility given she has no care givers and is previously failed outpatient recommendations for wound care (not adequate cleaning or frequent dressing changes) in the past prompting multiple readmissions.   Consultants:  None     Procedures:  None  Antimicrobials: Anti-infectives (From admission, onward)   Start     Dose/Rate Route Frequency Ordered Stop   07/09/18 0000  sulfamethoxazole-trimethoprim (BACTRIM) 400-80 MG tablet     1 tablet Oral 2 times daily 07/09/18 1619     07/07/18 1000  vancomycin (VANCOCIN) 1,500 mg in sodium chloride 0.9 % 500 mL IVPB  Status:  Discontinued     1,500 mg 250 mL/hr over 120 Minutes Intravenous Every 24 hours 07/06/18 1014 07/07/18 0938   07/07/18 1000  sulfamethoxazole-trimethoprim (BACTRIM DS,SEPTRA DS) 800-160 MG per tablet 2 tablet  Status:  Discontinued     2 tablet Oral Every 12 hours 07/07/18 0938 07/14/18 0734   07/06/18 1200  cefTRIAXone (ROCEPHIN) 2 g in sodium chloride 0.9 % 100 mL IVPB  Status:  Discontinued     2 g 200 mL/hr over 30 Minutes Intravenous Every 24 hours 07/06/18 0948 07/07/18 0938   07/06/18 1100  doxycycline (VIBRAMYCIN) 100 mg in sodium chloride 0.9 % 250 mL IVPB  Status:  Discontinued     100 mg 125 mL/hr over 120 Minutes Intravenous Every 12 hours 07/06/18 0915 07/06/18 1002   07/06/18 1015  vancomycin (VANCOCIN)  2,500 mg in sodium chloride 0.9 % 500 mL IVPB     2,500 mg 250 mL/hr over 120 Minutes Intravenous  Once 07/06/18 1014 07/07/18 0826        Cultures:  1 of 2 blood cultures, 07/04/2018 coag negative staph, urine culture 10/13-  Telemetry: No  DVT prophylaxis: SCDs   Objective: Vitals:   08/10/18 0658 08/10/18 1403 08/10/18 2001 08/11/18 0622  BP: (!) 120/57 (!) 101/57 113/78 (!) 104/52  Pulse: 90 84 98 84  Resp: 18 16 17 17   Temp: 98.2 F (36.8 C) 98.7 F (37.1 C) (!) 97.5 F (36.4 C) 98.6 F (37 C)  TempSrc: Oral Oral Oral Oral  SpO2: 96% 90% 100% 96%  Weight:      Height:        Intake/Output Summary (Last 24 hours) at 08/11/2018 1334 Last data filed at 08/11/2018 1122 Gross per 24 hour  Intake 240 ml  Output -  Net 240 ml   Filed Weights   08/04/18 0500 08/06/18 0500  Weight: 133.2 kg 132.9 kg    Exam:  Constitutional: Morbidly obese female, lying in bed comfortably Eyes: Redness in bilateral sclera, limited EOMI right eye ( almost no vision , chronic), reports blurry vision in left eye (chronic) with clear drainage from both eyes present Cardiovascular: RRR no MRGs, with no peripheral edema Respiratory: Normal respiratory effort, clear breath sounds  Abdomen: Soft,non-tender,  Neurologic: Grossly no focal neuro deficit. Psychiatric:flat affect. Mental status AAOx3  Data Reviewed: CBC: Recent Labs  Lab 08/10/18 0601  WBC 10.0  HGB 11.4*  HCT 37.9  MCV 85.9  PLT 335  Basic Metabolic Panel: Recent Labs  Lab 08/10/18 0601  NA 138  K 4.0  CL 107  CO2 23  GLUCOSE 81  BUN 8  CREATININE 0.84  CALCIUM 8.5*   GFR: Estimated Creatinine Clearance: 119.3 mL/min (by C-G formula based on SCr of 0.84 mg/dL). Liver Function Tests: No results for input(s): AST, ALT, ALKPHOS, BILITOT, PROT, ALBUMIN in the last 168 hours. No results for input(s): LIPASE, AMYLASE in the last 168 hours. No results for input(s): AMMONIA in the last 168  hours. Coagulation Profile: No results for input(s): INR, PROTIME in the last 168 hours. Cardiac Enzymes: No results for input(s): CKTOTAL, CKMB, CKMBINDEX, TROPONINI in the last 168 hours. BNP (last 3 results) No results for input(s): PROBNP in the last 8760 hours. HbA1C: No results for input(s): HGBA1C in the last 72 hours. CBG: No results for input(s): GLUCAP in the last 168 hours. Lipid Profile: No results for input(s): CHOL, HDL, LDLCALC, TRIG, CHOLHDL, LDLDIRECT in the last 72 hours. Thyroid Function Tests: No results for input(s): TSH, T4TOTAL, FREET4, T3FREE, THYROIDAB in the last 72 hours. Anemia Panel: No results for input(s): VITAMINB12, FOLATE, FERRITIN, TIBC, IRON, RETICCTPCT in the last 72 hours. Urine analysis:    Component Value Date/Time   COLORURINE YELLOW 07/05/2018 0511   APPEARANCEUR CLEAR 07/05/2018 0511   LABSPEC 1.020 07/05/2018 0511   PHURINE 5.0 07/05/2018 0511   GLUCOSEU NEGATIVE 07/05/2018 0511   HGBUR NEGATIVE 07/05/2018 0511   BILIRUBINUR NEGATIVE 07/05/2018 0511   KETONESUR 5 (A) 07/05/2018 0511   PROTEINUR 30 (A) 07/05/2018 0511   UROBILINOGEN 0.2 07/22/2015 0405   NITRITE NEGATIVE 07/05/2018 0511   LEUKOCYTESUR NEGATIVE 07/05/2018 0511   Sepsis Labs: @LABRCNTIP (procalcitonin:4,lacticidven:4)  )No results found for this or any previous visit (from the past 240 hour(s)).    Studies: No results found.  Scheduled Meds: . acetaminophen  650 mg Oral Q6H  . ferrous gluconate  324 mg Oral BID WC  . hydroxypropyl methylcellulose / hypromellose  1 drop Both Eyes TID  . metoprolol tartrate  50 mg Oral BID  . multivitamin with minerals  1 tablet Oral Daily    Continuous Infusions: . sodium chloride Stopped (07/23/18 1100)     LOS: 36 days     Desiree Hane, MD [facility] Pager [phone]  If 7PM-7AM, please contact night-coverage [email or web address] Password [email or web address] 08/11/2018, 1:34 PM

## 2018-08-11 NOTE — Consult Note (Signed)
[facility] Nurse wound follow up Wound type: Chronic full and partial thickness lesions.  New assessment requested for intertriginous inguinal areas bilaterally Measurement: numerous lesions in this region, very painful and difficult to assess for longer than a few seconds.  Wound VVK:PQAESL, draining Drainage (amount, consistency, odor) see above Periwound: indurated, macerated Dressing procedure/placement/frequency: I have nothing to offer the patient except continuation of the silver hydrofiber to absorb exudate and donate antimicrobial properties. Use of the InterDry Ag will wick excess exudate laterally and away from the thighs.  Patient is known to [place] for > 3 years. Please see previous recent notes from [facility] Nurses on 10/14 and 10/29.  This chronic condition currently excess the scope of our expertise and is best managed by Dermatology or in some instances surgery.  Surgery has seen in the past and signed off.  I am not certain that her needs will be met by a facility and recommend consultation with perhaps a [facility] based medical center which might agree to care for these lesions with a Dermatology team including residents. Other than that I have no new suggestions.    [place] nursing team will not follow, but will remain available to this patient, the nursing and medical teams.  Please re-consult if needed. Thanks, Maudie Flakes, MSN, RN, [place], Arther Abbott  Pager# [phone]

## 2018-08-11 NOTE — Progress Notes (Signed)
PT Cancellation Note  Patient Details Name: Dominique [name] MRN: [id number] DOB: [birth year]   Cancelled Treatment:     pt wokek with OT a limited session due to increased weakness.  Will attempt to see another day as schedule permits.   Rica Koyanagi  PTA Acute  Rehabilitation Services Pager      [phone] Office      [phone]

## 2018-08-12 NOTE — Progress Notes (Signed)
Physical Therapy Treatment Patient Details Name: Dominique [name] MRN: [id number] DOB: 1981-01-18 Today's Date: 08/12/2018    History of Present Illness 37 y.o. 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 Dx of hypovolemia, sepsis, cellulitis    PT Comments    Pt required increased time x 3 to get OOB and amb around room.  Feels "tired' with "no energy". Pt physically looks like she has lost a lot weight.  Assisted OOB to amb around room was all she agreed to try.  Then assisted to recliner and set up for bath.   Follow Up Recommendations  SNF     Equipment Recommendations       Recommendations for Other Services       Precautions / Restrictions Precautions Precaution Comments: colostomy; pt denies h/o falls in past 1 year Restrictions Weight Bearing Restrictions: No    Mobility  Bed Mobility Overal bed mobility: Modified Independent             General bed mobility comments: increased, increased, increased time pt was able to get self OOB  Transfers Overall transfer level: Modified independent Equipment used: None Transfers: Sit to/from Stand Sit to Stand: Modified independent (Device/Increase time) Stand pivot transfers: Supervision       General transfer comment: increased time  Ambulation/Gait Ambulation/Gait assistance: Supervision Gait Distance (Feet): 25 Feet(in room only) Assistive device: Rolling walker (2 wheeled) Gait Pattern/deviations: Step-through pattern;Decreased stride length;Wide base of support Gait velocity: decreased    General Gait Details: used her walker this session due to increased c/o fatigue   Stairs             Wheelchair Mobility    Modified Rankin (Stroke Patients Only)       Balance                                             Cognition Arousal/Alertness: Awake/alert Behavior During Therapy: WFL for tasks assessed/performed Overall Cognitive Status: Within Functional Limits for tasks assessed                                 General Comments: very slow moving       Exercises      General Comments        Pertinent Vitals/Pain Pain Assessment: Faces Faces Pain Scale: Hurts little more Pain Location: groin with movement Pain Descriptors / Indicators: Discomfort;Grimacing Pain Intervention(s): Monitored during session;Repositioned;Patient requesting pain meds-RN notified    Home Living                      Prior Function            PT Goals (current goals can now be found in the care plan section) Progress towards PT goals: Progressing toward goals    Frequency    Min 2X/week      PT Plan Current plan remains appropriate    Co-evaluation              AM-PAC PT "6 Clicks" Daily Activity  Outcome Measure  Difficulty turning over in bed (including adjusting bedclothes, sheets and blankets)?: A Little  Difficulty moving from lying on back to sitting on the side of the bed? : A Little Difficulty sitting down on and standing up from a chair with arms (e.g., wheelchair, bedside commode, etc,.)?: A Little Help needed moving to and from a bed to chair (including a wheelchair)?: A Little Help needed walking in hospital room?: A Little Help needed climbing 3-5 steps with a railing? : A Little 6 Click Score: 18    End of Session Equipment Utilized During Treatment: Gait belt Activity Tolerance: Patient tolerated treatment well Patient left: in chair;with call bell/phone within reach Nurse Communication: Mobility status PT Visit Diagnosis: Difficulty in walking, not elsewhere classified (R26.2)     Time: 1430-1510 PT Time Calculation (min) (ACUTE ONLY): 40 min  Charges:  $Gait Training: 8-22 mins $Therapeutic  Activity: 23-37 mins                     Rica Koyanagi  PTA Acute  Rehabilitation Services Pager      [phone] Office      [phone]

## 2018-08-12 NOTE — Progress Notes (Signed)
[facility] PROGRESS NOTE    Progress Note  Dominique [name]  HKV:[id number] DOB: 08-04-1981 DOA: 07/04/2018 PCP: Benito Mccreedy, MD     Brief Narrative:   Dominique [name] is an 37 y.o. female past medical history significant for morbid obesity, obstructive sleep apnea, Crohn's status post perforation with a colostomy, with a chronic abdominal wound who was in her usual state of health until she recently developed recurrent nausea and vomiting for several days with lightheadedness  Assessment/Plan:   Hypovolemia/ Orthostatic hypotension Resolved with aggressive hydration her symptoms resolved. Orthostatics status have resolved. Still awaiting placement.  Hypovolemic hyponatremia: Resolved with IV fluid hydration.  Anemia due to acute blood loss/iron deficiency anemia: Likely due to intermittent bleeding from wound site. Iron level was 14 ferritin was 15 she status post 1 unit of packed red blood cells. Now on oral supplementation. Check hemoglobin intermittently. Oral iron supplements.    Metabolic acidosis/acute kidney injury: Likely prerenal azotemia due to with IV fluids and IV bicarbonate.  Crohn's colitis with perforation s/p left colectomy/colostomy 2016: Cont. monitors to output as this may be contributing to her hypovolemia.  Sepsis due to multiple anterior trach in his wound of abdomen, peri-inguinal and peri-gluteal: Has chronic wound and abdomen from prior surgery and new wounds due to moisture damage. She will complete her course of antibiotics. Tinea judicious pain control with severe pain. Follow the [facility] GI wound nurse recommended reevaluation exploration of other options as an outpatient  Indwelling urinary catheter: Discontinued 07/15/2018.  Non-severe protein caloric malnutrition: Nutrition has been consulted they recommended Premier protein  Obstructive sleep apnea: Continue CPAP at night.  Morbid obesity with a BMI of 54: Counselin was  performed.  Severe deconditioning: Physical therapy evaluated the patient amended skilled nursing facility, awaiting placement. Not appropriate for CIR.  Sinus tachycardia: Continue metoprolol.  Bilateral eye redness: Last outpatient visit with Dr. Kerrin Champagne at week 12/2013 there was a concern for keratic conus but she never followed up. Ophthalmology recommended to DC clear eyes start Polytrim ophthalmologic 6 times a day. She will need follow-up as an outpatient with her ophthalmologist.  Severe deconditioning, PT recommended skilled nursing facility still awaiting placement.  Moderate nonsevere protein caloric malnutrition: has not been eating much in the hospital, has lost about 30 pounds.      DVT prophylaxis: lovenox Family Communication:none Disposition Plan/Barrier to D/C: awaiting on placement Code Status:     Code Status Orders  (From admission, onward)         Start     Ordered   07/04/18 2335  Full code  Continuous     07/04/18 2337        Code Status History    Date Active Date Inactive Code Status Order ID Comments User Context   05/30/2018 1310 06/03/2018 1814 Full Code [id number]  Alma Friendly, MD Inpatient   04/19/2018 1247 04/23/2018 1912 Full Code [id number]  Debbe Odea, MD ED   01/06/2017 0612 01/07/2017 0325 Full Code [id number]  Norval Morton, MD ED   12/25/2016 1257 12/29/2016 1749 Full Code [id number]  Debbe Odea, MD Inpatient   12/25/2016 0910 12/25/2016 1257 Full Code [id number]  Debbe Odea, MD ED   07/21/2015 0935 07/25/2015 2218 Full Code [id number]  Samella Parr, NP Inpatient   06/26/2015 1759 07/21/2015 0935 Full Code [id number]  Cathlyn Parsons, PA-C Inpatient   06/04/2015 0757 06/26/2015 1759 Full Code [id number]  Gennaro Africa, MD Inpatient   11/16/2014 2238 11/23/2014 1747 Full Code [id number]  Berle Mull, MD ED        IV Access:    Peripheral IV   Procedures and diagnostic studies:   No results found.   Medical  Consultants:    None.  Anti-Infectives:   none  Subjective:    Dominique [name] no complains.  Objective:    Vitals:   08/11/18 0622 08/11/18 1434 08/11/18 2133 08/12/18 0631  BP: (!) 104/52 91/65 (!) 106/49 (!) 108/52  Pulse: 84 (!) 110 78 100  Resp: 17 20 16 14   Temp: 98.6 F (37 C) 98.7 F (37.1 C) 98.5 F (36.9 C) 98.3 F (36.8 C)  TempSrc: Oral Oral Oral Oral  SpO2: 96% 96% 99% 100%  Weight:    129.7 kg  Height:        Intake/Output Summary (Last 24 hours) at 08/12/2018 0933 Last data filed at 08/11/2018 1122 Gross per 24 hour  Intake 240 ml  Output -  Net 240 ml   Filed Weights   08/06/18 0500 08/12/18 0631  Weight: 132.9 kg 129.7 kg    Exam: General exam: In no acute distress, morbidly obese Respiratory system: Good air movement and clear to auscultation. Cardiovascular system: S1 & S2 heard, RRR. Gastrointestinal system: Abdomen is nondistended, soft and nontender.  Central nervous system: Alert and oriented. No focal neurological deficits. Extremities: No pedal edema. Psychiatry: Judgement and insight appear normal. Mood & affect appropriate.    Data Reviewed:    Labs: Basic Metabolic Panel: Recent Labs  Lab 08/10/18 0601  NA 138  K 4.0  CL 107  CO2 23  GLUCOSE 81  BUN 8  CREATININE 0.84  CALCIUM 8.5*   GFR Estimated Creatinine Clearance: 117.4 mL/min (by C-G formula based on SCr of 0.84 mg/dL). Liver Function Tests: No results for input(s): AST, ALT, ALKPHOS, BILITOT, PROT, ALBUMIN in the last 168 hours. No results for input(s): LIPASE, AMYLASE in the last 168 hours. No results for input(s): AMMONIA in the last 168 hours. Coagulation profile No results for input(s): INR, PROTIME in the last 168 hours.  CBC: Recent Labs  Lab 08/10/18 0601  WBC 10.0  HGB 11.4*  HCT 37.9  MCV 85.9  PLT 335   Cardiac Enzymes: No results for input(s): CKTOTAL, CKMB, CKMBINDEX, TROPONINI in the last 168 hours. BNP (last 3 results) No results  for input(s): PROBNP in the last 8760 hours. CBG: No results for input(s): GLUCAP in the last 168 hours. D-Dimer: No results for input(s): DDIMER in the last 72 hours. Hgb A1c: No results for input(s): HGBA1C in the last 72 hours. Lipid Profile: No results for input(s): CHOL, HDL, LDLCALC, TRIG, CHOLHDL, LDLDIRECT in the last 72 hours. Thyroid function studies: No results for input(s): TSH, T4TOTAL, T3FREE, THYROIDAB in the last 72 hours.  Invalid input(s): FREET3 Anemia work up: No results for input(s): VITAMINB12, FOLATE, FERRITIN, TIBC, IRON, RETICCTPCT in the last 72 hours. Sepsis Labs: Recent Labs  Lab 08/10/18 0601  WBC 10.0   Microbiology No results found for this or any previous visit (from the past 240 hour(s)).   Medications:   . acetaminophen  650 mg Oral Q6H  . erythromycin   Both Eyes Q6H  . ferrous gluconate  324 mg Oral BID WC  . metoprolol tartrate  50 mg Oral BID  . multivitamin with minerals  1 tablet Oral Daily  . trimethoprim-polymyxin b  1 drop Both Eyes Q4H   Continuous Infusions: . sodium chloride Stopped (07/23/18 1100)      LOS:  80 days   [place] [facility] Pager [phone]  *Please refer to [email or web address], password [email or web address] to get updated schedule on who will round on this patient, as hospitalists switch teams weekly. If 7PM-7AM, please contact night-coverage at [email or web address], password [email or web address] for any overnight needs.  08/12/2018, 9:33 AM

## 2018-08-13 DIAGNOSIS — S31109A Unspecified open wound of abdominal wall, unspecified quadrant without penetration into peritoneal cavity, initial encounter: Secondary | ICD-10-CM

## 2018-08-13 DIAGNOSIS — L089 Local infection of the skin and subcutaneous tissue, unspecified: Secondary | ICD-10-CM

## 2018-08-13 MED ORDER — SULFAMETHOXAZOLE-TRIMETHOPRIM 800-160 MG PO TABS
1.0000 | ORAL_TABLET | Freq: Two times a day (BID) | ORAL | Status: AC
  Administered 2018-08-13 – 2018-08-19 (×14): 1 via ORAL
  Filled 2018-08-13 (×14): qty 1

## 2018-08-13 NOTE — Progress Notes (Signed)
[facility] PROGRESS NOTE    Progress Note  Dominique [name]  [id number]:[id number] DOB: 1981-03-14 DOA: 07/04/2018 PCP: Benito Mccreedy, MD     Brief Narrative:   Dominique [name] is an 37 y.o. female past medical history significant for morbid obesity, obstructive sleep apnea, Crohn's status post perforation with a colostomy, with a chronic abdominal wound who was in her usual state of health until she recently developed recurrent nausea and vomiting for several days with lightheadedness  Assessment/Plan:   Hypovolemia/ Orthostatic hypotension Resolved with aggressive hydration her symptoms resolved. Orthostatics status have resolved. Still awaiting placement.  Patient cannot take care of herself at home.  Hypovolemic hyponatremia: Resolved with IV fluid hydration.  Anemia due to acute blood loss/iron deficiency anemia: Likely due to intermittent bleeding from wound site. Iron level was 14 ferritin was 15 she status post 1 unit of packed red blood cells. Now on oral supplementation. Check hemoglobin intermittently. Oral iron supplements.    Metabolic acidosis/acute kidney injury: Likely prerenal azotemia due to with IV fluids and IV bicarbonate.  Crohn's colitis with perforation s/p left colectomy/colostomy 2016: Cont. monitors to output as this may be contributing to her hypovolemia.  Sepsis due to multiple anterior trach in his wound of abdomen, peri-inguinal and peri-gluteal: Has chronic wound and abdomen from prior surgery and new wounds due to moisture damage. She will complete her course of antibiotics. Tinea judicious pain control with severe pain. Follow the [facility] GI wound nurse recommended reevaluation exploration of other options as an outpatient  Indwelling urinary catheter: Discontinued 07/15/2018.  Non-severe protein caloric malnutrition: Nutrition has been consulted they recommended Premier protein  Obstructive sleep apnea: Continue CPAP at night.  Morbid  obesity with a BMI of 54: Counselin was performed.  Severe deconditioning: Physical therapy evaluated the patient amended skilled nursing facility, awaiting placement. Not appropriate for CIR.  Sinus tachycardia: Continue metoprolol.  Bilateral eye redness: Last outpatient visit with Dr. Kerrin Champagne at week 12/2013 there was a concern for keratic conus but she never followed up. Ophthalmology recommended to DC clear eyes start Polytrim ophthalmologic 6 times a day. She will need follow-up as an outpatient with her ophthalmologist.  Severe deconditioning, PT recommended skilled nursing facility still awaiting placement.  Moderate nonsevere protein caloric malnutrition: has not been eating much in the hospital, has lost about 30 pounds.  New infected fold wounds: Do daily dressing twice a day, started on oral Bactrim.     DVT prophylaxis: lovenox Family Communication:none Disposition Plan/Barrier to D/C: awaiting on placement Code Status:     Code Status Orders  (From admission, onward)         Start     Ordered   07/04/18 2335  Full code  Continuous     07/04/18 2337        Code Status History    Date Active Date Inactive Code Status Order ID Comments User Context   05/30/2018 1310 06/03/2018 1814 Full Code [id number]  Alma Friendly, MD Inpatient   04/19/2018 1247 04/23/2018 1912 Full Code [id number]  Debbe Odea, MD ED   01/06/2017 0612 01/07/2017 0325 Full Code [id number]  Norval Morton, MD ED   12/25/2016 1257 12/29/2016 1749 Full Code [id number]  Debbe Odea, MD Inpatient   12/25/2016 0910 12/25/2016 1257 Full Code [id number]  Debbe Odea, MD ED   07/21/2015 0935 07/25/2015 2218 Full Code [id number]  Samella Parr, NP Inpatient   06/26/2015 1759 07/21/2015 0935 Full Code [id number]  Zachary, Lavon Paganini, PA-C Inpatient  06/04/2015 0757 06/26/2015 1759 Full Code [id number]  Gennaro Africa, MD Inpatient   11/16/2014 2238 11/23/2014 1747 Full Code [id number]  Berle Mull, MD ED          IV Access:    Peripheral IV   Procedures and diagnostic studies:   No results found.   Medical Consultants:    None.  Anti-Infectives:   none  Subjective:    Dominique [name] no complains.  Objective:    Vitals:   08/12/18 0631 08/12/18 1350 08/12/18 2226 08/13/18 0634  BP: (!) 108/52 96/63 (!) 108/54 104/62  Pulse: 100 79 92 (!) 106  Resp: 14 16 16 14   Temp: 98.3 F (36.8 C) 98.5 F (36.9 C) 97.9 F (36.6 C) 99 F (37.2 C)  TempSrc: Oral Oral Oral Oral  SpO2: 100% 100% 96% 96%  Weight: 129.7 kg     Height:       No intake or output data in the 24 hours ending 08/13/18 0802 Filed Weights   08/06/18 0500 08/12/18 0631  Weight: 132.9 kg 129.7 kg    Exam: General exam: In no acute distress, morbidly obese Respiratory system: Good air movement and clear to auscultation. Cardiovascular system: S1 & S2 heard, RRR. Gastrointestinal system: Abdomen is nondistended, soft and nontender.  Central nervous system: Alert and oriented. No focal neurological deficits. Extremities: No pedal edema. Psychiatry: Judgement and insight appear normal. Mood & affect appropriate.    Data Reviewed:    Labs: Basic Metabolic Panel: Recent Labs  Lab 08/10/18 0601  NA 138  K 4.0  CL 107  CO2 23  GLUCOSE 81  BUN 8  CREATININE 0.84  CALCIUM 8.5*   GFR Estimated Creatinine Clearance: 117.4 mL/min (by C-G formula based on SCr of 0.84 mg/dL). Liver Function Tests: No results for input(s): AST, ALT, ALKPHOS, BILITOT, PROT, ALBUMIN in the last 168 hours. No results for input(s): LIPASE, AMYLASE in the last 168 hours. No results for input(s): AMMONIA in the last 168 hours. Coagulation profile No results for input(s): INR, PROTIME in the last 168 hours.  CBC: Recent Labs  Lab 08/10/18 0601  WBC 10.0  HGB 11.4*  HCT 37.9  MCV 85.9  PLT 335   Cardiac Enzymes: No results for input(s): CKTOTAL, CKMB, CKMBINDEX, TROPONINI in the last 168 hours. BNP (last 3  results) No results for input(s): PROBNP in the last 8760 hours. CBG: No results for input(s): GLUCAP in the last 168 hours. D-Dimer: No results for input(s): DDIMER in the last 72 hours. Hgb A1c: No results for input(s): HGBA1C in the last 72 hours. Lipid Profile: No results for input(s): CHOL, HDL, LDLCALC, TRIG, CHOLHDL, LDLDIRECT in the last 72 hours. Thyroid function studies: No results for input(s): TSH, T4TOTAL, T3FREE, THYROIDAB in the last 72 hours.  Invalid input(s): FREET3 Anemia work up: No results for input(s): VITAMINB12, FOLATE, FERRITIN, TIBC, IRON, RETICCTPCT in the last 72 hours. Sepsis Labs: Recent Labs  Lab 08/10/18 0601  WBC 10.0   Microbiology No results found for this or any previous visit (from the past 240 hour(s)).   Medications:   . acetaminophen  650 mg Oral Q6H  . erythromycin   Both Eyes Q6H  . ferrous gluconate  324 mg Oral BID WC  . metoprolol tartrate  50 mg Oral BID  . multivitamin with minerals  1 tablet Oral Daily  . trimethoprim-polymyxin b  1 drop Both Eyes Q4H   Continuous Infusions: . sodium chloride Stopped (07/23/18 1100)  LOS: 27 days   [place] [facility] Pager [phone]  *Please refer to [email or web address], password [email or web address] to get updated schedule on who will round on this patient, as hospitalists switch teams weekly. If 7PM-7AM, please contact night-coverage at [email or web address], password [email or web address] for any overnight needs.  08/13/2018, 8:02 AM

## 2018-08-13 NOTE — Progress Notes (Signed)
Occupational Therapy Treatment Patient Details Name: Dominique [name] MRN: [id number] DOB: 29-Apr-1981 Today's Date: 08/13/2018    History of present illness 37 y.o. 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 Dx of hypovolemia, sepsis, cellulitis   OT comments  Pt tolerating more than last tx but still decreased endurance.  Decreased vision reaching for rails/toilet paper   Follow Up Recommendations  SNF    Equipment Recommendations  3 in 1 bedside commode    Recommendations for Other Services      Precautions / Restrictions Precautions Precaution Comments: colostomy; pt denies h/o falls in past 1 year Restrictions Weight Bearing Restrictions: No       Mobility Bed Mobility         Supine to sit: Supervision     General bed mobility comments: increased time and effort  Transfers Overall transfer level: Modified independent                    Balance                                           ADL either performed or assessed with clinical judgement   ADL                           Toilet Transfer: Supervision/safety;Ambulation;Comfort height toilet;Grab bars             General ADL Comments: pt continues to have visual difficulty. Ambulated to bathroom with supervision. Pt sways but no LOB.  Set up for bathing from chair:  pt did not feel up to shower today.  Set up for ADL     Vision   Additional Comments: pt with increased visual difficulty:  being followed by opthamology:  had difficulty judging distance and reaching for toilet paper   Perception     Praxis      Cognition Arousal/Alertness: Awake/alert Behavior During Therapy: WFL for tasks assessed/performed Overall Cognitive Status: Within  Functional Limits for tasks assessed                                          Exercises     Shoulder Instructions       General Comments pt was able to walk into bathroom but didn't feel up to gettting into shower today    Pertinent Vitals/ Pain       Pain Score: 9  Pain Location: groin with movement Pain Descriptors / Indicators: Discomfort;Grimacing Pain Intervention(s): Limited activity within patient's tolerance;Monitored during session;Patient requesting pain meds-RN notified  Home Living                                          Prior Functioning/Environment              Frequency           Progress Toward Goals  OT Goals(current goals can now be found in the care plan section)  Progress towards OT  goals: (progressing slowly)     Plan      Co-evaluation                 AM-PAC PT "6 Clicks" Daily Activity     Outcome Measure   Help from another person eating meals?: None Help from another person taking care of personal grooming?: A Little Help from another person toileting, which includes using toliet, bedpan, or urinal?: A Little Help from another person bathing (including washing, rinsing, drying)?: A Little Help from another person to put on and taking off regular upper body clothing?: A Little Help from another person to put on and taking off regular lower body clothing?: A Little 6 Click Score: 19    End of Session    OT Visit Diagnosis: Unsteadiness on feet (R26.81);Muscle weakness (generalized) (M62.81)   Activity Tolerance     Patient Left     Nurse Communication          Time: 3979-5369 OT Time Calculation (min): 43 min  Charges: OT General Charges $OT Visit: 1 Visit OT Treatments $Self Care/Home Management : 8-22 mins $Therapeutic Activity: 8-22 mins  [name], OTR/L Acute Rehabilitation Services [phone] [facility] pager [phone]  office 08/13/2018   [name] 08/13/2018, 3:37 PM

## 2018-08-14 MED ORDER — FLUCONAZOLE 100MG IVPB
100.0000 mg | INTRAVENOUS | Status: DC
  Administered 2018-08-14 – 2018-08-17 (×4): 100 mg via INTRAVENOUS
  Filled 2018-08-14 (×6): qty 50

## 2018-08-14 MED ORDER — SODIUM CHLORIDE 0.9 % IV BOLUS
500.0000 mL | Freq: Once | INTRAVENOUS | Status: AC
  Administered 2018-08-14: 500 mL via INTRAVENOUS

## 2018-08-14 NOTE — Progress Notes (Signed)
[facility] PROGRESS NOTE    Progress Note  Dominique [name]  IDP:[id number] DOB: 09-Apr-1981 DOA: 07/04/2018 PCP: Benito Mccreedy, MD     Brief Narrative:   Dominique [name] is an 37 y.o. female past medical history significant for morbid obesity, obstructive sleep apnea, Crohn's status post perforation with a colostomy, with a chronic abdominal wound who was in her usual state of health until she recently developed recurrent nausea and vomiting for several days with lightheadedness  Assessment/Plan:   Hypovolemia/ Orthostatic hypotension Resolved with aggressive hydration her symptoms resolved. Orthostatics status have resolved. Still awaiting placement.  Patient cannot take care of herself at home.  Hypovolemic hyponatremia: Resolved with IV fluid hydration.  Anemia due to acute blood loss/iron deficiency anemia: Likely due to intermittent bleeding from wound site. Iron level was 14 ferritin was 15 she status post 1 unit of packed red blood cells. Now on oral supplementation. Check hemoglobin intermittently. Oral iron supplements.    Metabolic acidosis/acute kidney injury: Likely prerenal azotemia due to with IV fluids and IV bicarbonate.  Crohn's colitis with perforation s/p left colectomy/colostomy 2016: Cont. monitors to output as this may be contributing to her hypovolemia.  Sepsis due to multiple anterior trach in his wound of abdomen, peri-inguinal and peri-gluteal: Has chronic wound and abdomen from prior surgery and new wounds due to moisture damage. She will complete her course of antibiotics. Continue judicious pain control with severe pain. Wound nurse recommended reevaluation exploration of other options as an outpatient. Reconsult surgery.  Indwelling urinary catheter: Discontinued 07/15/2018.  Non-severe protein caloric malnutrition: Nutrition has been consulted they recommended Premier protein  Obstructive sleep apnea: Continue CPAP at  night.  Morbid obesity with a BMI of 54: Counselin was performed.  Severe deconditioning: Physical therapy evaluated the patient amended skilled nursing facility, awaiting placement. Not appropriate for CIR.  Sinus tachycardia: Continue metoprolol.  Bilateral eye redness: Last outpatient visit with Dr. Kerrin Champagne at week 12/2013 there was a concern for keratic conus but she never followed up. Ophthalmology recommended to DC clear eyes start Polytrim ophthalmologic 6 times a day. She will need follow-up as an outpatient with her ophthalmologist.  Severe deconditioning, PT recommended skilled nursing facility still awaiting placement. Therapy saw the patient again recommended skilled nursing facility, social worker having trouble placing the patient.  Moderate nonsevere protein caloric malnutrition: has not been eating much in the hospital, has lost about 30 pounds.  New infected fold wounds: Do daily dressing twice a day, started on oral Bactrim. Started empirically on IV plan as she is at high risk of infections.     DVT prophylaxis: lovenox Family Communication:none Disposition Plan/Barrier to D/C: awaiting on placement Code Status:     Code Status Orders  (From admission, onward)         Start     Ordered   07/04/18 2335  Full code  Continuous     07/04/18 2337        Code Status History    Date Active Date Inactive Code Status Order ID Comments User Context   05/30/2018 1310 06/03/2018 1814 Full Code [id number]  Alma Friendly, MD Inpatient   04/19/2018 1247 04/23/2018 1912 Full Code [id number]  Debbe Odea, MD ED   01/06/2017 0612 01/07/2017 0325 Full Code [id number]  Norval Morton, MD ED   12/25/2016 1257 12/29/2016 1749 Full Code [id number]  Debbe Odea, MD Inpatient   12/25/2016 0910 12/25/2016 1257 Full Code [id number]  Debbe Odea, MD ED   07/21/2015  0935 07/25/2015 2218 Full Code [id number]  Samella Parr, NP Inpatient   06/26/2015 1759 07/21/2015 0935 Full  Code [id number]  Elizabeth Sauer Inpatient   06/04/2015 0757 06/26/2015 1759 Full Code [id number]  Gennaro Africa, MD Inpatient   11/16/2014 2238 11/23/2014 1747 Full Code [id number]  Berle Mull, MD ED        IV Access:    Peripheral IV   Procedures and diagnostic studies:   No results found.   Medical Consultants:    None.  Anti-Infectives:   none  Subjective:    Dominique [name] no complains.  Objective:    Vitals:   08/13/18 0634 08/13/18 1400 08/13/18 2147 08/14/18 0640  BP: 104/62 108/67 (!) 101/55 105/66  Pulse: (!) 106 100 89 91  Resp: 14 16 16 18   Temp: 99 F (37.2 C) 98.9 F (37.2 C) 98.7 F (37.1 C) 98.6 F (37 C)  TempSrc: Oral Oral Oral Oral  SpO2: 96% 97% 100% 99%  Weight:      Height:        Intake/Output Summary (Last 24 hours) at 08/14/2018 0826 Last data filed at 08/13/2018 1203 Gross per 24 hour  Intake 360 ml  Output 300 ml  Net 60 ml   Filed Weights   08/12/18 0631  Weight: 129.7 kg    Exam: General exam: In no acute distress, morbidly obese Respiratory system: Good air movement and clear to auscultation. Cardiovascular system: S1 & S2 heard, RRR. Gastrointestinal system: Abdomen is nondistended, soft and nontender.  Central nervous system: Alert and oriented. No focal neurological deficits. Extremities: No pedal edema. Psychiatry: Judgement and insight appear normal. Mood & affect appropriate.    Data Reviewed:    Labs: Basic Metabolic Panel: Recent Labs  Lab 08/10/18 0601  NA 138  K 4.0  CL 107  CO2 23  GLUCOSE 81  BUN 8  CREATININE 0.84  CALCIUM 8.5*   GFR Estimated Creatinine Clearance: 117.4 mL/min (by C-G formula based on SCr of 0.84 mg/dL). Liver Function Tests: No results for input(s): AST, ALT, ALKPHOS, BILITOT, PROT, ALBUMIN in the last 168 hours. No results for input(s): LIPASE, AMYLASE in the last 168 hours. No results for input(s): AMMONIA in the last 168 hours. Coagulation profile No  results for input(s): INR, PROTIME in the last 168 hours.  CBC: Recent Labs  Lab 08/10/18 0601  WBC 10.0  HGB 11.4*  HCT 37.9  MCV 85.9  PLT 335   Cardiac Enzymes: No results for input(s): CKTOTAL, CKMB, CKMBINDEX, TROPONINI in the last 168 hours. BNP (last 3 results) No results for input(s): PROBNP in the last 8760 hours. CBG: No results for input(s): GLUCAP in the last 168 hours. D-Dimer: No results for input(s): DDIMER in the last 72 hours. Hgb A1c: No results for input(s): HGBA1C in the last 72 hours. Lipid Profile: No results for input(s): CHOL, HDL, LDLCALC, TRIG, CHOLHDL, LDLDIRECT in the last 72 hours. Thyroid function studies: No results for input(s): TSH, T4TOTAL, T3FREE, THYROIDAB in the last 72 hours.  Invalid input(s): FREET3 Anemia work up: No results for input(s): VITAMINB12, FOLATE, FERRITIN, TIBC, IRON, RETICCTPCT in the last 72 hours. Sepsis Labs: Recent Labs  Lab 08/10/18 0601  WBC 10.0   Microbiology No results found for this or any previous visit (from the past 240 hour(s)).   Medications:   . acetaminophen  650 mg Oral Q6H  . erythromycin   Both Eyes Q6H  . ferrous gluconate  324 mg Oral BID WC  .  metoprolol tartrate  50 mg Oral BID  . multivitamin with minerals  1 tablet Oral Daily  . sulfamethoxazole-trimethoprim  1 tablet Oral Q12H  . trimethoprim-polymyxin b  1 drop Both Eyes Q4H   Continuous Infusions: . sodium chloride Stopped (07/23/18 1100)      LOS: 39 days   Charlynne Cousins  [facility] Pager [phone]  *Please refer to [email or web address], password [email or web address] to get updated schedule on who will round on this patient, as hospitalists switch teams weekly. If 7PM-7AM, please contact night-coverage at [email or web address], password [email or web address] for any overnight needs.  08/14/2018, 8:26 AM

## 2018-08-14 NOTE — Progress Notes (Signed)
Physical Therapy Treatment Patient Details Name: Dominique [name] MRN: [id number] DOB: 1980/09/26 Today's Date: 08/14/2018    History of Present Illness 37 y.o. 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 Dx of hypovolemia, sepsis, cellulitis    PT Comments    Assisted OOB to amb a limited distance in hallway then assisted to bathroom then assisted to recliner.  Pt moves VERY slowly and required increased time.  Pt c/o increased groin (wound area) pain with mobility.  Pain meds requested during session.    Follow Up Recommendations  SNF     Equipment Recommendations  Rolling walker with 5" wheels;3in1 (PT)    Recommendations for Other Services       Precautions / Restrictions Precautions Precaution Comments: colostomy; pt denies h/o falls in past 1 year Restrictions Weight Bearing Restrictions: No    Mobility  Bed Mobility Overal bed mobility: Modified Independent             General bed mobility comments: increased, increased time but able to self perform  Transfers Overall transfer level: Modified independent Equipment used: None Transfers: Sit to/from Stand Sit to Stand: Modified independent (Device/Increase time)         General transfer comment: increased time   Ambulation/Gait Ambulation/Gait assistance: Supervision Gait Distance (Feet): 48 Feet Assistive device: Rolling walker (2 wheeled);None Gait Pattern/deviations: Step-through pattern;Decreased stride length;Wide base of support     General Gait Details: around room pt does not use walker but will when amb in hallway for increased support    Stairs             Wheelchair Mobility    Modified Rankin (Stroke Patients Only)       Balance                                            Cognition Arousal/Alertness: Awake/alert Behavior During Therapy: WFL for tasks assessed/performed Overall Cognitive Status: Within Functional Limits for tasks assessed                                 General Comments: very slow moving       Exercises      General Comments        Pertinent Vitals/Pain Pain Assessment: 0-10 Pain Score: 9  Pain Location: groin with movement Pain Descriptors / Indicators: Discomfort;Grimacing Pain Intervention(s): Patient requesting pain meds-RN notified;Monitored during session    Home Living                      Prior Function            PT Goals (current goals can now be found in the care plan section) Progress towards PT goals: Progressing toward goals    Frequency    Min 2X/week      PT Plan Current plan remains appropriate    Co-evaluation              AM-PAC PT "6 Clicks" Daily Activity  Outcome Measure  Difficulty turning over in bed (including adjusting bedclothes, sheets and blankets)?: A Little Difficulty moving from lying on back to sitting on  the side of the bed? : A Little Difficulty sitting down on and standing up from a chair with arms (e.g., wheelchair, bedside commode, etc,.)?: A Little Help needed moving to and from a bed to chair (including a wheelchair)?: A Little Help needed walking in hospital room?: A Little Help needed climbing 3-5 steps with a railing? : A Little 6 Click Score: 18    End of Session Equipment Utilized During Treatment: Gait belt Activity Tolerance: Patient tolerated treatment well Patient left: in chair;with call bell/phone within reach Nurse Communication: Mobility status PT Visit Diagnosis: Difficulty in walking, not elsewhere classified (R26.2)     Time: 1530-1610 PT Time Calculation (min) (ACUTE ONLY): 40 min  Charges:  $Gait Training: 8-22 mins $Therapeutic Activity: 23-37 mins                     {Jakobe Blau  PTA Acute  Rehabilitation Services Pager      [phone] Office      [phone]

## 2018-08-14 NOTE — Progress Notes (Signed)
Nutrition Follow-up  DOCUMENTATION CODES:   Non-severe (moderate) malnutrition in context of chronic illness, Morbid obesity  INTERVENTION:  - Continue Magic Cup once/day.  - Continue to encourage PO intakes.   NUTRITION DIAGNOSIS:   Moderate Malnutrition related to chronic illness as evidenced by energy intake < or equal to 75% for > or equal to 1 month, percent weight loss. -ongoing  GOAL:   Patient will meet greater than or equal to 90% of their needs -unmet  MONITOR:   PO intake, Supplement acceptance, Weight trends, Labs, Skin  ASSESSMENT:   37 y.o. female past medical history significant for obesity, OSA, Crohn's disease s/p perforation with colostomy, chronic wound. She was in her usual health state of health until she developed recurrent nausea and vomiting several days ago, she is not lightheaded. Patient admitted with diagnosis of hypovolemia.   Patient has been eating 25-50% of 1-2 meals over the past few days. She did not order breakfast this AM. Patient refuses all oral nutrition supplements but will occasionally accept Magic Cup once/day. She reports appetite is okay and is at baseline. Continue patient to eat and at least try something at each meal to encourage wound healing and maintaining lean body mass.   Per Dr. Barbra Sarks note this AM, patient is awaiting placement.     Medication reviewed; daily multivitamin with minerals.  Labs reviewed; Ca: 8.5 mg/dL.   Diet Order:   Diet Order            Diet regular Room service appropriate? Yes; Fluid consistency: Thin  Diet effective now        Diet - low sodium heart healthy        Diet - low sodium heart healthy              EDUCATION NEEDS:   Not appropriate for education at this time  Skin:  Skin Assessment: Skin Integrity Issues: Skin Integrity Issues:: Other (Comment) Other: per flowsheet, non-pressure wounds to: abdomen, upper and lower abdomen, L breast, bilateral groins, sacrum, and bilateral  lower back.  Last BM:  900 mL 11/21 (ileostomy)  Height:   Ht Readings from Last 1 Encounters:  07/04/18 5' 2"  (1.575 m)    Weight:   Wt Readings from Last 1 Encounters:  05/30/18 (!) 163 kg    Ideal Body Weight:  50 kg  BMI:  Body mass index is 52.29 kg/m.  Estimated Nutritional Needs:   Kcal:  2230-2510 (16-18 kcal/kg)  Protein:  135-145 grams  Fluid:  >/= 2.3 L/day     Jarome Matin, MS, RD, LDN, [facility] Inpatient Clinical Dietitian Pager # [phone] After hours/weekend pager # [phone]

## 2018-08-14 NOTE — Consult Note (Signed)
Dominique [name] Consult/Admission Note  Dominique [name] [birth year]  [id number].    Requesting MD: Dr. Aileen Fass Chief Complaint/Reason for Consult: wounds  HPI:   Pt is a 37 yo female with a hx of morbid obesity, obstructive sleep apnea, Crohn's status post perforation with a colostomy, with a chronic abdominal wound who was admitted for recurrent nausea and vomiting for several days with lightheadedness. Pt has chronic wounds from prior [name] and wounds in her folds that have been present since march. She has been taking care of these wounds herself. 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 She states she showers or does bird bathes daily. She does her own wound care. She states the wounds in her perineum are difficult to manage cause when she urinates they get wet. She has some pain in these areas when she gets up and down from bed. She states the wounds do no sting when she washes them. We were asked to see these wounds.   ROS:  Review of Systems  Constitutional: Negative for chills, diaphoresis and fever.  HENT: Negative for sore throat.   Respiratory: Negative for cough and shortness of breath.   Cardiovascular: Negative for chest pain.  Gastrointestinal: Negative for abdominal pain, blood in stool, constipation, diarrhea, nausea and vomiting.  Genitourinary: Negative for dysuria.  Skin: Negative for rash.       + for wounds with purulent drainage in fold of skin  Neurological: Negative for dizziness and loss of consciousness.  All other systems reviewed and are negative.    Family History  Problem Relation Age of Onset  . Hypertension Mother   . Allergies Mother   . Hypertension Father   . Cancer Paternal Grandmother        lung  . Hypertension Sister   . Asthma Brother   . Diabetes Maternal Grandmother   . Hypertension Maternal Grandmother   . Hypertension Maternal Grandfather   . Emphysema Paternal Grandfather     Past  Medical History:  Diagnosis Date  . Acute acalculous cholecystitis 11/16/2014  . Anal fistula   . Anemia   . Anxiety   . Asthma   . CHF (congestive heart failure) ([place])   . Complication of anesthesia    states she had a problem staying awake after her c-section, was [name]  . Coronary artery disease   . Diverticulitis of colon 11/17/2014  . Headache    used to have migraines  . Hepatic steatosis 11/17/2014  . Herpes   . Hypertension   . Morbid obesity ([place]) 03/22/2009   Qualifier: Diagnosis of  By: Ronnald Ramp CMA, [place]    . Neuropathy   . OSA (obstructive sleep apnea) 05/02/2014  . Seasonal allergies 06/13/2014  . Sickle cell anemia ([place])    She states she " has the trait" (07/20/15)  . Sleep apnea    uses Bipap  . Thyromegaly 05/02/2014    Past Surgical History:  Procedure Laterality Date  . c section 2011  2011  . COLONOSCOPY WITH PROPOFOL N/A 12/27/2015   Procedure: COLONOSCOPY WITH PROPOFOL;  Surgeon: Wonda Horner, MD;  Location: [facility] ENDOSCOPY;  Service: Endoscopy;  Laterality: N/A;  . COLOSTOMY N/A 06/11/2015   Procedure:  CREATION OF COLOSTOMY;  Surgeon: Georganna Skeans, MD;  Location: [place];  Service: General;  Laterality: N/A;  . ESOPHAGOGASTRODUODENOSCOPY N/A 07/19/2015   Procedure: ESOPHAGOGASTRODUODENOSCOPY (EGD);  Surgeon: Clarene Essex, MD;  Location: [facility] ENDOSCOPY;  Service: Endoscopy;  Laterality: N/A;  . FLEXIBLE SIGMOIDOSCOPY N/A 06/05/2015   Procedure: FLEXIBLE SIGMOIDOSCOPY;  Surgeon: Ronald Lobo, MD;  Location: [facility] ENDOSCOPY;  Service: Endoscopy;  Laterality: N/A;  . LAPAROTOMY N/A 06/09/2015   Procedure: EXPLORATORY LAPAROTOMY, DRAINAGE OF INTRAABDOMINAL ABSCESS, PARTIAL COLON RESECTION,  APPLICATION OF WOUND VAC;  Surgeon: Georganna Skeans, MD;  Location: [place];  Service: General;  Laterality: N/A;  . LAPAROTOMY N/A 06/11/2015   Procedure: RE-EXPLORATION OF ABDOMEN;  Surgeon: Georganna Skeans, MD;  Location: [place];  Service: General;  Laterality:  N/A;    Social History:  reports that she has been smoking cigarettes. She started smoking about 4 years ago. She has a 2.00 pack-year smoking history. She has never used smokeless tobacco. She reports that she does not drink alcohol or use drugs.  Allergies:  Allergies  Allergen Reactions  . Other Shortness Of Breath and Swelling    Tree nuts  . Penicillins Other (See Comments)    Unknown childhood allergy Has patient had a PCN reaction causing immediate rash, facial/tongue/throat swelling, SOB or lightheadedness with hypotension: Unknown Has patient had a PCN reaction causing severe rash involving mucus membranes or skin necrosis: Unknown Has patient had a PCN reaction that required hospitalization: Unknown Has patient had a PCN reaction occurring within the last 10 years: Unknown If all of the above answers are "NO", then may proceed with Cephalosporin use.     Medications Prior to Admission  Medication Sig Dispense Refill  . acetaminophen (TYLENOL) 500 MG tablet Take 500 mg by mouth every 6 (six) hours as needed for mild pain, moderate pain or headache.    Marland Kitchen tiZANidine (ZANAFLEX) 4 MG tablet Take 4 mg by mouth every 8 (eight) hours as needed for muscle spasms.   1  . albuterol (PROVENTIL) (2.5 MG/3ML) 0.083% nebulizer solution Take 3 mLs (2.5 mg total) by nebulization 2 (two) times daily as needed for wheezing. (Patient not taking: Reported on 07/04/2018) 75 mL 12  . ferrous sulfate 325 (65 FE) MG tablet Take 1 tablet (325 mg total) by mouth daily with breakfast. (Patient not taking: Reported on 07/04/2018) 30 tablet 0  . Hydrocortisone (GERHARDT'S BUTT CREAM) CREA Apply 1 application topically 3 (three) times daily. (Patient not taking: Reported on 07/04/2018) 1 each 0  . metoprolol tartrate (LOPRESSOR) 25 MG tablet Take 1 tablet (25 mg total) by mouth 2 (two) times daily. (Patient not taking: Reported on 07/04/2018) 60 tablet 0  . polyethylene glycol (MIRALAX / GLYCOLAX) packet Take  17 g by mouth daily as needed for mild constipation. (Patient not taking: Reported on 07/04/2018) 14 each 0  . tobramycin (TOBREX) 0.3 % ophthalmic solution Place 2 drops into both eyes every 6 (six) hours. (Patient not taking: Reported on 07/04/2018) 5 mL 0    Blood pressure (!) 90/54, pulse 78, temperature 97.8 F (36.6 C), temperature source Oral, resp. rate 16, height 5' 2"  (1.575 m), weight 129.7 kg, SpO2 95 %.  Physical Exam  Constitutional: She is oriented to person, place, and time. She appears well-developed and well-nourished. No distress.  HENT:  Head: Normocephalic and atraumatic.  Mouth/Throat: Mucous membranes are normal.  Eyes: Lids are normal. Right eye exhibits discharge. Left eye exhibits discharge.  Both lens are cloudy, red conjunctiva with watery discharge b/l. Unable to assess pupils well  Cardiovascular: Normal rate, regular rhythm and normal heart sounds.  Pulmonary/Chest: Effort normal and breath sounds normal. No respiratory distress.  Abdominal:  Obese, midline scar with small open  area that extends into the fold to the right and to the left. (see photos below). Multiple wounds with similar appearance and with purulent drainage and foul odor noted in the other folds on her abdomen and in her groin.   Neurological: She is alert and oriented to person, place, and time. Coordination normal.  Skin: Skin is warm and dry. She is not diaphoretic.  Psychiatric: She has a normal mood and affect. Her behavior is normal.  Nursing note and vitals reviewed.   Right side   Left side   No results found for this or any previous visit (from the past 48 hour(s)). No results found.    Assessment/Plan Principal Problem:   Hypovolemia Active Problems:   Orthostatic hypotension   Crohn's colitis with perforation s/p left colectomy/colostomy 2016   OSA on CPAP   Chronic anemia   Multiple wounds of skin   Mild renal insufficiency   Hyponatremia   Malnutrition of  moderate degree   Dehydration  Chronic wounds in folds of skin - these wounds need better daily cleansing and need to remain dry in order to heal. I do not think she is cleaning them well at home. Continue wound care per WOC. Agree with silver hydrofiber twice daily and need to make sure wounds are cleaned and thoroughly dried before application of silver hydrofiber. Perhaps outpt dermatology would be able to help also.   Thank you for the consult. Nothing surgical to do at this time. We will sign off. Please page [place] with any further needs.    Kalman Drape, [facility] [name] 08/14/2018, 3:19 PM Pager: [phone] Consults: [phone] Mon-Fri 7:00 am-4:30 pm Sat-Sun 7:00 am-11:30 am

## 2018-08-15 NOTE — Progress Notes (Signed)
[facility] PROGRESS NOTE    Progress Note  Dominique [name]  XAJ:[id number] DOB: 11-17-1980 DOA: 07/04/2018 PCP: Benito Mccreedy, MD     Brief Narrative:   Dominique [name] is an 37 y.o. female past medical history significant for morbid obesity, obstructive sleep apnea, Crohn's status post perforation with a colostomy, with a chronic abdominal wound who was in her usual state of health until she recently developed recurrent nausea and vomiting for several days with lightheadedness  Assessment/Plan:   Hypovolemia/ Orthostatic hypotension Resolved with aggressive hydration her symptoms resolved. Orthostatics status have resolved. Still awaiting placement.  She cannot take care of herself at home.  Hypovolemic hyponatremia: Resolved with IV fluid hydration.  Anemia due to acute blood loss/iron deficiency anemia: Likely due to intermittent bleeding from wound site. Iron level was 14 ferritin was 15 she status post 1 unit of packed red blood cells. Now on oral supplementation. Check hemoglobin intermittently. Oral iron supplements.    Metabolic acidosis/acute kidney injury: Likely prerenal azotemia due to with IV fluids and IV bicarbonate.  Crohn's colitis with perforation s/p left colectomy/colostomy 2016: Cont. monitors to output as this may be contributing to her hypovolemia.  Sepsis due to multiple anterior trach in his wound of abdomen, peri-inguinal and peri-gluteal: Has chronic wound and abdomen from prior surgery and new wounds due to moisture damage. She will complete her course of antibiotics. Continue judicious pain control with severe pain. Wound nurse recommended reevaluation exploration of other options as an outpatient. Reconsult surgery.  Indwelling urinary catheter: Discontinued 07/15/2018.  Non-severe protein caloric malnutrition: Nutrition has been consulted they recommended Premier protein  Obstructive sleep apnea: Continue CPAP at night.  Morbid  obesity with a BMI of 54: Counselin was performed.  Severe deconditioning: Physical therapy evaluated the patient amended skilled nursing facility, awaiting placement. Not appropriate for CIR.  Sinus tachycardia: Continue metoprolol.  Bilateral eye redness: Last outpatient visit with Dr. Kerrin Champagne at week 12/2013 there was a concern for keratic conus but she never followed up. Ophthalmology recommended to DC clear eyes start Polytrim ophthalmologic 6 times a day. She will need follow-up as an outpatient with her ophthalmologist.  Severe deconditioning, PT recommended skilled nursing facility still awaiting placement. Therapy saw the patient again recommended skilled nursing facility, social worker having trouble placing the patient.  Moderate nonsevere protein caloric malnutrition: has not been eating much in the hospital, has lost about 30 pounds.  New infected fold wounds: Do daily dressing twice a day, started on oral Bactrim. Surgery was consulted they recommended that they need to be clean better.  And keep dry.     DVT prophylaxis: lovenox Family Communication:none Disposition Plan/Barrier to D/C: awaiting on placement Code Status:     Code Status Orders  (From admission, onward)         Start     Ordered   07/04/18 2335  Full code  Continuous     07/04/18 2337        Code Status History    Date Active Date Inactive Code Status Order ID Comments User Context   05/30/2018 1310 06/03/2018 1814 Full Code [id number]  Alma Friendly, MD Inpatient   04/19/2018 1247 04/23/2018 1912 Full Code [id number]  Debbe Odea, MD ED   01/06/2017 0612 01/07/2017 0325 Full Code [id number]  Norval Morton, MD ED   12/25/2016 1257 12/29/2016 1749 Full Code [id number]  Debbe Odea, MD Inpatient   12/25/2016 0910 12/25/2016 1257 Full Code [id number]  Debbe Odea, MD ED  07/21/2015 0935 07/25/2015 2218 Full Code [id number]  Samella Parr, NP Inpatient   06/26/2015 1759 07/21/2015 0935 Full  Code [id number]  Elizabeth Sauer Inpatient   06/04/2015 0757 06/26/2015 1759 Full Code [id number]  Gennaro Africa, MD Inpatient   11/16/2014 2238 11/23/2014 1747 Full Code [id number]  Berle Mull, MD ED        IV Access:    Peripheral IV   Procedures and diagnostic studies:   No results found.   Medical Consultants:    None.  Anti-Infectives:   none  Subjective:    Dominique [name] no complains.  Objective:    Vitals:   08/14/18 1817 08/14/18 1842 08/14/18 2059 08/15/18 0650  BP:  (!) 92/58 100/63 (!) 100/53  Pulse:  90 89 96  Resp:   20 18  Temp:   97.9 F (36.6 C) 98.6 F (37 C)  TempSrc:   Oral Oral  SpO2:   100% 99%  Weight: 128.6 kg     Height:        Intake/Output Summary (Last 24 hours) at 08/15/2018 0913 Last data filed at 08/14/2018 1800 Gross per 24 hour  Intake 50 ml  Output -  Net 50 ml   Filed Weights   08/12/18 0631 08/14/18 1817  Weight: 129.7 kg 128.6 kg    Exam: General exam: In no acute distress, morbidly obese Respiratory system: Good air movement and clear to auscultation. Cardiovascular system: S1 & S2 heard, RRR. Gastrointestinal system: Abdomen is nondistended, soft and nontender.  Central nervous system: Alert and oriented. No focal neurological deficits. Extremities: No pedal edema. Psychiatry: Judgement and insight appear normal. Mood & affect appropriate.    Data Reviewed:    Labs: Basic Metabolic Panel: Recent Labs  Lab 08/10/18 0601  NA 138  K 4.0  CL 107  CO2 23  GLUCOSE 81  BUN 8  CREATININE 0.84  CALCIUM 8.5*   GFR Estimated Creatinine Clearance: 116.8 mL/min (by C-G formula based on SCr of 0.84 mg/dL). Liver Function Tests: No results for input(s): AST, ALT, ALKPHOS, BILITOT, PROT, ALBUMIN in the last 168 hours. No results for input(s): LIPASE, AMYLASE in the last 168 hours. No results for input(s): AMMONIA in the last 168 hours. Coagulation profile No results for input(s): INR, PROTIME in the  last 168 hours.  CBC: Recent Labs  Lab 08/10/18 0601  WBC 10.0  HGB 11.4*  HCT 37.9  MCV 85.9  PLT 335   Cardiac Enzymes: No results for input(s): CKTOTAL, CKMB, CKMBINDEX, TROPONINI in the last 168 hours. BNP (last 3 results) No results for input(s): PROBNP in the last 8760 hours. CBG: No results for input(s): GLUCAP in the last 168 hours. D-Dimer: No results for input(s): DDIMER in the last 72 hours. Hgb A1c: No results for input(s): HGBA1C in the last 72 hours. Lipid Profile: No results for input(s): CHOL, HDL, LDLCALC, TRIG, CHOLHDL, LDLDIRECT in the last 72 hours. Thyroid function studies: No results for input(s): TSH, T4TOTAL, T3FREE, THYROIDAB in the last 72 hours.  Invalid input(s): FREET3 Anemia work up: No results for input(s): VITAMINB12, FOLATE, FERRITIN, TIBC, IRON, RETICCTPCT in the last 72 hours. Sepsis Labs: Recent Labs  Lab 08/10/18 0601  WBC 10.0   Microbiology No results found for this or any previous visit (from the past 240 hour(s)).   Medications:   . acetaminophen  650 mg Oral Q6H  . erythromycin   Both Eyes Q6H  . ferrous gluconate  324 mg Oral BID WC  .  metoprolol tartrate  50 mg Oral BID  . multivitamin with minerals  1 tablet Oral Daily  . sulfamethoxazole-trimethoprim  1 tablet Oral Q12H  . trimethoprim-polymyxin b  1 drop Both Eyes Q4H   Continuous Infusions: . sodium chloride Stopped (07/23/18 1100)  . fluconazole (DIFLUCAN) IV 100 mg (08/14/18 1322)      LOS: 40 days   [facility] Pager [phone]  *Please refer to [email or web address], password [email or web address] to get updated schedule on who will round on this patient, as hospitalists switch teams weekly. If 7PM-7AM, please contact night-coverage at [email or web address], password [email or web address] for any overnight needs.  08/15/2018, 9:13 AM

## 2018-08-16 LAB — MRSA PCR SCREENING: MRSA BY PCR: NEGATIVE

## 2018-08-16 MED ORDER — VANCOMYCIN HCL 500 MG IV SOLR
INTRAVENOUS | Status: DC
  Administered 2018-08-16 (×5): via OPHTHALMIC
  Filled 2018-08-16: qty 0

## 2018-08-16 MED ORDER — VANCOMYCIN HCL 500 MG IV SOLR
INTRAVENOUS | Status: DC
  Administered 2018-08-17 – 2018-08-18 (×13): via OPHTHALMIC
  Filled 2018-08-16 (×8): qty 250

## 2018-08-16 MED ORDER — VANCOMYCIN HCL 500 MG IV SOLR
INTRAVENOUS | Status: DC
  Filled 2018-08-16 (×8): qty 250

## 2018-08-16 MED ORDER — TOBRAMYCIN SULFATE 80 MG/2ML IJ SOLN
OPHTHALMIC | Status: DC
  Administered 2018-08-16 (×4): via OPHTHALMIC
  Filled 2018-08-16: qty 5

## 2018-08-16 MED ORDER — HYDROMORPHONE HCL 1 MG/ML IJ SOLN
1.0000 mg | Freq: Once | INTRAMUSCULAR | Status: AC
  Administered 2018-08-16: 1 mg via INTRAVENOUS
  Filled 2018-08-16: qty 1

## 2018-08-16 MED ORDER — TOBRAMYCIN SULFATE 80 MG/2ML IJ SOLN
OPHTHALMIC | Status: DC
  Administered 2018-08-17 – 2018-08-18 (×13): via OPHTHALMIC
  Filled 2018-08-16 (×8): qty 5

## 2018-08-16 MED ORDER — TOBRAMYCIN SULFATE 80 MG/2ML IJ SOLN
OPHTHALMIC | Status: DC
  Filled 2018-08-16 (×8): qty 5

## 2018-08-16 NOTE — Progress Notes (Signed)
Progress note #2 today.  Follow up corneal hydrops/ulceraton  I came in this morning and wrote a progress note in Epic, but it is not showing up.  Perhaps I hit "cancel" rather than "sign"?  Subjective:  Patient reports improvement in pain OU.  She has noticed dramatic improvement of pain OS over the last few days.  Objective:  Vision still CF OD and HM OS  TP:  15 OD, 20 OS  OD:  Inferior central Cornea scar with NV.  The diffuse conj injection she had earlier this week is resolved.  She has hyperemia inferiorly at the edge of the neovascularized scar.  AC formed and without hypopyon  OS:  3+ diffuse injection of conj.  No mucus or debris in tear film.  Inferior- central corneal opacity much less suppurative today.  Central ulceration clearing, but looks to be very thin.  Worrisome for potential perforation. Still no hypopyon.  AC formed.     A/P:  Bilateral keratoconus with  Improving hydrops/ucler OS.  Though she has improved, I am worried about potential for perforation. I spoke to pharmacy about antibiotic options.  They do not have moxifloxacin available (they only first generation fluoroquinolones).  Will culture and start fortified vancomycin and tobramycin q 1 hour alternating by 30 minutes.  [facility] cannot compound this, but the [facility] pharmacist has kindly agreed to find a way to get it over here.  Also, [facility] cannot provide culture media, but they agreed to send media I brought from my office to [place] lab.  I cultured patient's left eye.  Will D/C EES and polytrim once I know the fortified vancomycin and tobramycin are here.

## 2018-08-16 NOTE — Progress Notes (Signed)
Followup corneal hydrops and ulceraton OS  EYE meds:  Polytrim q 4 hours OU,  Erythromycin ung qid OU  Patient reports the eyes feel better.  Left eye feeling a lot better in the couple days.  No pain OD.  Mild-moderate pain OS.    Bedside exam:  Patient sitting comfortably in bed  Vision OD:  CF       OS:  HM  Conjunctiva:  OS:  1+ inferior injection    OD:  3+  Injection Cornea: OD: Large inferior central scar with neovacularization.  Epithelium intact               OS:  Inferior central corneal ulcer much less suppurative Central ulcer concerning for thinning.  Seidel negative  AC:  Formed OU.  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  The left cornea looks less inflamed, but has not improved as much as I hoped.  OS with central thinning worrisome for potential for perforation.  I will talk to pharmacy about starting fortified antibiotic drops.  Poor prognosis OU.

## 2018-08-16 NOTE — Progress Notes (Signed)
New orders received for 2 different eye drops to be given every hour alternating 97mn between the eye drops. On-call ophthalmologist notified and per him the standard of care for this severe of a corneal ulcer is what has been ordered. Discussed concerns of this not being feasible on a tele unit with patient loads between 5-6 patients, he was very understanding and asked she be moved to an area who can accomidate such orders. Dr. FVenetia Constablenotified of request.   About 30 minutes later Dr. MEllie Lunchspoke with me and the charge nurse on the floor and discussed with uKoreathe severity and complexity of her case. She explained the importance of the eye drops being given as ordered to prevent corneal perforation. It is of her opinion that a [facility] setting such as [facility] would be of benefit to the patient. Also, she stated that she does not have the appropriate tools and resources needed to fully examine the patient's corneas. Dr. FVenetia Constablenotified of her thoughts and will call her directly to discuss further the plan for this patient.   Dr. FVenetia Constablecalled to update me that after speaking to Dr. MEllie Lunchthat she will not be transferred to [facility] and that we need to try and give the eye drops as ordered. I explained to him this was not feasible on a unit of our acuity and ratios, he understood but said we need to call ICU and see if they will accept her and if so he will transfer her.  Charge nurse called the ICU CN and they agreed to accept the patient since they have beds available. AC also notified of situation and is looking into other option for the patient to receive the care she need at the appropriate level and facility.   Patient transferred to ICU around 1850.

## 2018-08-16 NOTE — Progress Notes (Signed)
[facility] PROGRESS NOTE    Progress Note  Dominique [name]  RSW:[id number] DOB: 1981/08/27 DOA: 07/04/2018 PCP: Benito Mccreedy, MD     Brief Narrative:   Dominique [name] is an 37 y.o. female past medical history significant for morbid obesity, obstructive sleep apnea, Crohn's status post perforation with a colostomy, with a chronic abdominal wound who was in her usual state of health until she recently developed recurrent nausea and vomiting for several days with lightheadedness  Assessment/Plan:   Hypovolemia/ Orthostatic hypotension Resolved with aggressive hydration her symptoms resolved. Orthostatics status have resolved. Still awaiting placement.  She cannot take care of herself at home.  Hypovolemic hyponatremia: Resolved with IV fluid hydration.  Anemia due to acute blood loss/iron deficiency anemia: Likely due to intermittent bleeding from wound site. Iron level was 14 ferritin was 15 she status post 1 unit of packed red blood cells. Now on oral supplementation. Check hemoglobin intermittently. Oral iron supplements.    Metabolic acidosis/acute kidney injury: Likely prerenal azotemia due to with IV fluids and IV bicarbonate.  Crohn's colitis with perforation s/p left colectomy/colostomy 2016: Cont. monitors to output as this may be contributing to her hypovolemia.  Sepsis due to multiple anterior trach in his wound of abdomen, peri-inguinal and peri-gluteal: Continue judicious pain control with severe pain. Wound nurse recommended reevaluation exploration of other options as an outpatient. Reconsult surgery.  Indwelling urinary catheter: Discontinued 07/15/2018.  Non-severe protein caloric malnutrition: Nutrition has been consulted they recommended Premier protein  Obstructive sleep apnea: Continue CPAP at night.  Morbid obesity with a BMI of 54: Counselin was performed.  Severe deconditioning: Physical therapy evaluated the patient amended skilled  nursing facility, awaiting placement. Not appropriate for CIR.  Sinus tachycardia: Continue metoprolol.  Bilateral eye redness: Last outpatient visit with Dr. Kerrin Champagne at week 12/2013 there was a concern for keratic conus but she never followed up. Ophthalmology recommended to DC clear eyes start Polytrim ophthalmologic 6 times a day. She will need follow-up as an outpatient with her ophthalmologist.  Severe deconditioning, PT recommended skilled nursing facility still awaiting placement. Therapy saw the patient again recommended skilled nursing facility, social worker having trouble placing the patient.  Moderate nonsevere protein caloric malnutrition: has not been eating much in the hospital, has lost about 30 pounds.  New infected fold wounds: Do daily dressing twice a day, started on oral Bactrim. Surgery was consulted they recommended that they need to be dry and clean. Continue twice a day dressing changes  DVT prophylaxis: lovenox Family Communication:none Disposition Plan/Barrier to D/C: awaiting on placement Code Status:     Code Status Orders  (From admission, onward)         Start     Ordered   07/04/18 2335  Full code  Continuous     07/04/18 2337        Code Status History    Date Active Date Inactive Code Status Order ID Comments User Context   05/30/2018 1310 06/03/2018 1814 Full Code [id number]  Alma Friendly, MD Inpatient   04/19/2018 1247 04/23/2018 1912 Full Code [id number]  Debbe Odea, MD ED   01/06/2017 0612 01/07/2017 0325 Full Code [id number]  Norval Morton, MD ED   12/25/2016 1257 12/29/2016 1749 Full Code [id number]  Debbe Odea, MD Inpatient   12/25/2016 0910 12/25/2016 1257 Full Code [id number]  Debbe Odea, MD ED   07/21/2015 0935 07/25/2015 2218 Full Code [id number]  Samella Parr, NP Inpatient   06/26/2015 1759 07/21/2015 0935 Full  Code [id number]  Elizabeth Sauer Inpatient   06/04/2015 0757 06/26/2015 1759 Full Code [id number]  Gennaro Africa, MD Inpatient   11/16/2014 2238 11/23/2014 1747 Full Code [id number]  Berle Mull, MD ED        IV Access:    Peripheral IV   Procedures and diagnostic studies:   No results found.   Medical Consultants:    None.  Anti-Infectives:   none  Subjective:    Dominique [name] no complains.  Objective:    Vitals:   08/15/18 1357 08/15/18 2200 08/16/18 0440 08/16/18 0947  BP: (!) 110/54 (!) 140/126 (!) 99/52 (!) 100/54  Pulse: 82 92 83 84  Resp: 18 16 18    Temp: 97.9 F (36.6 C)  98.2 F (36.8 C)   TempSrc: Oral  Oral   SpO2: 95% 95% 97%   Weight:      Height:        Intake/Output Summary (Last 24 hours) at 08/16/2018 1018 Last data filed at 08/16/2018 0600 Gross per 24 hour  Intake 290 ml  Output 100 ml  Net 190 ml   Filed Weights   08/12/18 0631 08/14/18 1817  Weight: 129.7 kg 128.6 kg    Exam: General exam: In no acute distress, morbidly obese Respiratory system: Good air movement and clear to auscultation. Cardiovascular system: S1 & S2 heard, RRR. Gastrointestinal system: Abdomen is nondistended, soft and nontender.  Central nervous system: Alert and oriented. No focal neurological deficits. Extremities: No pedal edema. Psychiatry: Judgement and insight appear normal. Mood & affect appropriate.    Data Reviewed:    Labs: Basic Metabolic Panel: Recent Labs  Lab 08/10/18 0601  NA 138  K 4.0  CL 107  CO2 23  GLUCOSE 81  BUN 8  CREATININE 0.84  CALCIUM 8.5*   GFR Estimated Creatinine Clearance: 116.8 mL/min (by C-G formula based on SCr of 0.84 mg/dL). Liver Function Tests: No results for input(s): AST, ALT, ALKPHOS, BILITOT, PROT, ALBUMIN in the last 168 hours. No results for input(s): LIPASE, AMYLASE in the last 168 hours. No results for input(s): AMMONIA in the last 168 hours. Coagulation profile No results for input(s): INR, PROTIME in the last 168 hours.  CBC: Recent Labs  Lab 08/10/18 0601  WBC 10.0  HGB 11.4*  HCT  37.9  MCV 85.9  PLT 335   Cardiac Enzymes: No results for input(s): CKTOTAL, CKMB, CKMBINDEX, TROPONINI in the last 168 hours. BNP (last 3 results) No results for input(s): PROBNP in the last 8760 hours. CBG: No results for input(s): GLUCAP in the last 168 hours. D-Dimer: No results for input(s): DDIMER in the last 72 hours. Hgb A1c: No results for input(s): HGBA1C in the last 72 hours. Lipid Profile: No results for input(s): CHOL, HDL, LDLCALC, TRIG, CHOLHDL, LDLDIRECT in the last 72 hours. Thyroid function studies: No results for input(s): TSH, T4TOTAL, T3FREE, THYROIDAB in the last 72 hours.  Invalid input(s): FREET3 Anemia work up: No results for input(s): VITAMINB12, FOLATE, FERRITIN, TIBC, IRON, RETICCTPCT in the last 72 hours. Sepsis Labs: Recent Labs  Lab 08/10/18 0601  WBC 10.0   Microbiology No results found for this or any previous visit (from the past 240 hour(s)).   Medications:   . acetaminophen  650 mg Oral Q6H  . erythromycin   Both Eyes Q6H  . ferrous gluconate  324 mg Oral BID WC  . metoprolol tartrate  50 mg Oral BID  . multivitamin with minerals  1 tablet Oral Daily  .  sulfamethoxazole-trimethoprim  1 tablet Oral Q12H  . trimethoprim-polymyxin b  1 drop Both Eyes Q4H   Continuous Infusions: . sodium chloride Stopped (07/23/18 1100)  . fluconazole (DIFLUCAN) IV Stopped (08/15/18 1455)      LOS: 41 days   [facility] Pager [phone]  *Please refer to [email or web address], password [email or web address] to get updated schedule on who will round on this patient, as hospitalists switch teams weekly. If 7PM-7AM, please contact night-coverage at [email or web address], password [email or web address] for any overnight needs.  08/16/2018, 10:18 AM

## 2018-08-17 NOTE — Progress Notes (Signed)
[facility] PROGRESS NOTE    Progress Note  Dominique [name]  HBZ:[id number] DOB: 07/19/1981 DOA: 07/04/2018 PCP: Benito Mccreedy, MD     Brief Narrative:   Dominique [name] is an 36 y.o. female past medical history significant for morbid obesity, obstructive sleep apnea, Crohn's status post perforation with a colostomy, with a chronic abdominal wound who was in her usual state of health until she recently developed recurrent nausea and vomiting for several days with lightheadedness  Assessment/Plan:   Hypovolemia/ Orthostatic hypotension Resolved with aggressive hydration her symptoms resolved. Orthostatics status have resolved. Still awaiting placement.  She cannot take care of herself at home.  Hypovolemic hyponatremia: Resolved with IV fluid hydration.  Anemia due to acute blood loss/iron deficiency anemia: Likely due to intermittent bleeding from wound site. Iron level was 14 ferritin was 15 she status post 1 unit of packed red blood cells. Now on oral supplementation. Check hemoglobin intermittently. Oral iron supplements.    Metabolic acidosis/acute kidney injury: Likely prerenal azotemia due to with IV fluids and IV bicarbonate.  Crohn's colitis with perforation s/p left colectomy/colostomy 2016: Cont. monitors to output as this may be contributing to her hypovolemia.  Sepsis due to multiple anterior trach in his wound of abdomen, peri-inguinal and peri-gluteal: Continue judicious pain control with severe pain. Wound nurse recommended reevaluation exploration of other options as an outpatient. Reconsult surgery.  Indwelling urinary catheter: Discontinued 07/15/2018.  Non-severe protein caloric malnutrition: Nutrition has been consulted they recommended Premier protein  Obstructive sleep apnea: Continue CPAP at night.  Morbid obesity with a BMI of 54: Counselin was performed.  Severe deconditioning: Physical therapy evaluated the patient amended skilled  nursing facility, awaiting placement. Not appropriate for CIR.  Sinus tachycardia: Continue metoprolol.  Bilateral eye redness: Last outpatient visit with Dr. Kerrin Champagne at week 12/2013 there was a concern for keratic conus but she never followed up. Ophthalmology evaluated relates that her hydrops was improved but not as she would like she d recommended new eyedrops vancomycin and tobramycin alternating every 30 minutes.  We will reevaluate in 29 2019.  Severe deconditioning, PT recommended skilled nursing facility still awaiting placement. Therapy saw the patient again recommended skilled nursing facility, social worker having trouble placing the patient.  Moderate nonsevere protein caloric malnutrition: has not been eating much in the hospital, admission she weighed 157.9 kg, on 08/13/2018 she was 128.6 kg, there is likely as she has a control diet in the hospital.  New infected fold wounds: Do daily dressing twice a day, started on oral Bactrim. Surgery was consulted they recommended that they need to be dry and clean. Continue twice a day dressing changes  DVT prophylaxis: lovenox Family Communication:none Disposition Plan/Barrier to D/C: awaiting on placement Code Status:     Code Status Orders  (From admission, onward)         Start     Ordered   07/04/18 2335  Full code  Continuous     07/04/18 2337        Code Status History    Date Active Date Inactive Code Status Order ID Comments User Context   05/30/2018 1310 06/03/2018 1814 Full Code [id number]  Alma Friendly, MD Inpatient   04/19/2018 1247 04/23/2018 1912 Full Code [id number]  Debbe Odea, MD ED   01/06/2017 0612 01/07/2017 0325 Full Code [id number]  Norval Morton, MD ED   12/25/2016 1257 12/29/2016 1749 Full Code [id number]  Debbe Odea, MD Inpatient   12/25/2016 0910 12/25/2016 1257 Full Code  [id number]  Debbe Odea, MD ED   07/21/2015 0935 07/25/2015 2218 Full Code [id number]  Samella Parr, NP Inpatient    06/26/2015 1759 07/21/2015 0935 Full Code [id number]  Cathlyn Parsons, PA-C Inpatient   06/04/2015 0757 06/26/2015 1759 Full Code [id number]  Gennaro Africa, MD Inpatient   11/16/2014 2238 11/23/2014 1747 Full Code [id number]  Berle Mull, MD ED        IV Access:    Peripheral IV   Procedures and diagnostic studies:   No results found.   Medical Consultants:    None.  Anti-Infectives:   none  Subjective:    Dominique [name] no complains.  Objective:    Vitals:   08/16/18 2100 08/17/18 0000 08/17/18 0009 08/17/18 0359  BP: (!) 99/40 (!) 108/41    Pulse: 87 99    Resp: 15 14    Temp:   98 F (36.7 C) 98.5 F (36.9 C)  TempSrc:   Oral Oral  SpO2: 97% 98%    Weight:      Height:        Intake/Output Summary (Last 24 hours) at 08/17/2018 0741 Last data filed at 08/16/2018 1815 Gross per 24 hour  Intake 530 ml  Output 150 ml  Net 380 ml   Filed Weights   08/12/18 0631 08/14/18 1817  Weight: 129.7 kg 128.6 kg    Exam: General exam: In no acute distress, morbidly obese Respiratory system: Good air movement and clear to auscultation. Cardiovascular system: S1 & S2 heard, RRR. Gastrointestinal system: Abdomen is nondistended, soft and nontender.  Central nervous system: Alert and oriented. No focal neurological deficits. Extremities: No pedal edema. Psychiatry: Judgement and insight appear normal. Mood & affect appropriate.    Data Reviewed:    Labs: Basic Metabolic Panel: No results for input(s): NA, K, CL, CO2, GLUCOSE, BUN, CREATININE, CALCIUM, MG, PHOS in the last 168 hours. GFR Estimated Creatinine Clearance: 116.8 mL/min (by C-G formula based on SCr of 0.84 mg/dL). Liver Function Tests: No results for input(s): AST, ALT, ALKPHOS, BILITOT, PROT, ALBUMIN in the last 168 hours. No results for input(s): LIPASE, AMYLASE in the last 168 hours. No results for input(s): AMMONIA in the last 168 hours. Coagulation profile No results for input(s): INR,  PROTIME in the last 168 hours.  CBC: No results for input(s): WBC, NEUTROABS, HGB, HCT, MCV, PLT in the last 168 hours. Cardiac Enzymes: No results for input(s): CKTOTAL, CKMB, CKMBINDEX, TROPONINI in the last 168 hours. BNP (last 3 results) No results for input(s): PROBNP in the last 8760 hours. CBG: No results for input(s): GLUCAP in the last 168 hours. D-Dimer: No results for input(s): DDIMER in the last 72 hours. Hgb A1c: No results for input(s): HGBA1C in the last 72 hours. Lipid Profile: No results for input(s): CHOL, HDL, LDLCALC, TRIG, CHOLHDL, LDLDIRECT in the last 72 hours. Thyroid function studies: No results for input(s): TSH, T4TOTAL, T3FREE, THYROIDAB in the last 72 hours.  Invalid input(s): FREET3 Anemia work up: No results for input(s): VITAMINB12, FOLATE, FERRITIN, TIBC, IRON, RETICCTPCT in the last 72 hours. Sepsis Labs: No results for input(s): PROCALCITON, WBC, LATICACIDVEN in the last 168 hours. Microbiology Recent Results (from the past 240 hour(s))  MRSA PCR Screening     Status: None   Collection Time: 08/16/18  7:14 PM  Result Value Ref Range Status   MRSA by PCR NEGATIVE NEGATIVE Final    Comment:        The GeneXpert MRSA Assay (FDA approved for  NASAL specimens only), is one component of a comprehensive MRSA colonization surveillance program. It is not intended to diagnose MRSA infection nor to guide or monitor treatment for MRSA infections. Performed at [facility], [place] [address], [place], [place] [address]      Medications:   . acetaminophen  650 mg Oral Q6H  . ferrous gluconate  324 mg Oral BID WC  . metoprolol tartrate  50 mg Oral BID  . multivitamin with minerals  1 tablet Oral Daily  . sulfamethoxazole-trimethoprim  1 tablet Oral Q12H   Continuous Infusions: . sodium chloride Stopped (07/23/18 1100)  . fluconazole (DIFLUCAN) IV Stopped (08/16/18 1355)  . tobramycin (TOBREX) 0.3 % 100 drop, tobramycin  (NEBCIN) 80 mg    . vancomycin (VANCOCIN) 250 mg, sterile water (preservative free) 5 mL        LOS: 42 days   [facility] Pager [phone]  *Please refer to [email or web address], password [email or web address] to get updated schedule on who will round on this patient, as hospitalists switch teams weekly. If 7PM-7AM, please contact night-coverage at [email or web address], password [email or web address] for any overnight needs.  08/17/2018, 7:41 AM

## 2018-08-17 NOTE — Progress Notes (Signed)
Follow up corneal hydrops with ulcer OS  Patient feels eye is doing better  Eye  Meds:  Fortified tobramycin and vancomycin q2 hours OS (alternating on the hour)  Vision OS: HM  OS:  3+ injection, inferior-central cornea ulcer continues to clear.  Still thin at central ulcer.  Siedel negative.  No mucus in TF.  AC formed and without hyphema.    Impression:  Improving hydrops with ulceration OS.  Cultures pending.  Plan:  OK to hold drops from 11:00pm to 7:00 am each day so patient can sleep           Will likely add steroid drop in a few days if she continues to improve.

## 2018-08-17 NOTE — Care Management Note (Signed)
Case Management Note  Patient Details  Name: Dominique [name] MRN: [id number] Date of Birth: 09-15-1981  Subjective/Objective:                  37 yo female with a hx of morbid obesity, obstructive sleep apnea, Crohn's status post perforation with a colostomy, with a chronic abdominal wound who was admitted for recurrent nausea and vomiting for several days with lightheadedness. Pt has chronic wounds from prior surgery and wounds in her folds that have been present since march. She has been taking care of these wounds herself. 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 She states she showers or does bird bathes daily. She does her own wound care. She states the wounds in her perineum are difficult to manage cause when she urinates they get wet. She has some pain in these areas when she gets up and down from bed. She states the wounds do no sting when she washes them. We were asked to see these wounds.  Chronic wounds in folds of skin - these wounds need better daily cleansing and need to remain dry in order to heal. I do not think she is cleaning them well at home. Continue wound care per WOC. Agree with silver hydrofiber twice daily and need to make sure wounds are cleaned and thoroughly dried before application of silver hydrofiber. Perhaps outpt dermatology would be able to help also.  Action/Plan: Will follow for progression of care and clinical status. Will follow for case management needs none present at this time.  Expected Discharge Date:  07/31/18               Expected Discharge Plan:  Skilled Nursing Facility  In-House Referral:  Clinical Social Work  Discharge planning Services  CM Consult  Post Acute Care Choice:  Home Health(Active w/[facility]) Choice offered to:     DME Arranged:    DME Agency:     HH Arranged:    [place] Agency:     Status of Service:  Completed, signed off  If discussed at [employer], dates discussed:     Additional Comments:  Leeroy Cha, RN 08/17/2018, 8:55 AM

## 2018-08-17 NOTE — Progress Notes (Signed)
Physical Therapy Treatment Patient Details Name: Dominique [name] MRN: [id number] DOB: 1981-08-24 Today's Date: 08/17/2018    History of Present Illness 37 y.o. 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 Dx of hypovolemia, sepsis, cellulitis    PT Comments    Pt continues to progress with PT; she is mod independent with bed mobility, transfers from high and low surfaces; she is nearing mod I for amb, will follow at 1x per wk; recommend pt mobilize with nursing staff in addition to PT to maximize activity tolerance; if pt stay is further prolonged she will likely not be skilled from a PT standpoint; would likely need SNF for wound care  Follow Up Recommendations  SNF     Equipment Recommendations  Rolling walker with 5" wheels;3in1 (PT)    Recommendations for Other Services       Precautions / Restrictions Precautions Precaution Comments: colostomy; pt denies h/o falls in past 1 year Restrictions Weight Bearing Restrictions: No    Mobility  Bed Mobility Overal bed mobility: Modified Independent Bed Mobility: Supine to Sit     Supine to sit: Modified independent (Device/Increase time)     General bed mobility comments: pt is able to self perform without assist, incr time only   Transfers Overall transfer level: Modified independent Equipment used: None Transfers: Sit to/from Stand Sit to Stand: Modified independent (Device/Increase time)(from toilet and bed) Stand pivot transfers: Modified independent (Device/Increase time)       General transfer comment: increased time   Ambulation/Gait Ambulation/Gait assistance: Supervision Gait Distance (Feet): 20 Feet(10') Assistive device: Rolling walker (2 wheeled) Gait Pattern/deviations: Step-through  pattern;Decreased stride length;Wide base of support Gait velocity: decreased    General Gait Details: pt declines use of RW, if she were to use RW she would likely be mod I; supervision for safety     Stairs             Wheelchair Mobility    Modified Rankin (Stroke Patients Only)       Balance Overall balance assessment: Needs assistance   Sitting balance-Leahy Scale: Good     Standing balance support: No upper extremity supported Standing balance-Leahy Scale: Good Standing balance comment: pt is able to stand, wt shfit to perform her own peri-care, wash hands without LOB                            Cognition Arousal/Alertness: Awake/alert Behavior During Therapy: WFL for tasks assessed/performed Overall Cognitive Status: Within Functional Limits for tasks assessed                                        Exercises      General Comments        Pertinent Vitals/Pain Pain Assessment: No/denies pain Pain Score: 4  Pain Location: groin with movement Pain Descriptors / Indicators: Discomfort;Grimacing Pain Intervention(s): Limited activity within patient's tolerance;Monitored during session    Home Living                      Prior Function            PT Goals (current goals can now be found in the  care plan section) Acute Rehab PT Goals Patient Stated Goal: to heal wounds, be with her 80 year old daughter, help her dad who is a dialysis pt PT Goal Formulation: With patient Time For Goal Achievement: 08/31/18 Potential to Achieve Goals: Good Progress towards PT goals: Progressing toward goals    Frequency    Min 1X/week      PT Plan Current plan remains appropriate;Frequency needs to be updated    Co-evaluation              AM-PAC PT "6 Clicks" Mobility   Outcome Measure  Help needed turning from your back to your side while in a flat bed without using bedrails?: None Help needed moving from lying on  your back to sitting on the side of a flat bed without using bedrails?: None Help needed moving to and from a bed to a chair (including a wheelchair)?: None Help needed standing up from a chair using your arms (e.g., wheelchair or bedside chair)?: None Help needed to walk in hospital room?: None Help needed climbing 3-5 steps with a railing? : A Little 6 Click Score: 23    End of Session   Activity Tolerance: Patient tolerated treatment well Patient left: in chair;with call bell/phone within reach;with nursing/sitter in room Nurse Communication: Mobility status PT Visit Diagnosis: Difficulty in walking, not elsewhere classified (R26.2)     Time: 1683-7290 PT Time Calculation (min) (ACUTE ONLY): 34 min  Charges:  $Gait Training: 8-22 mins $Therapeutic Activity: 8-22 mins                        [facility] 08/17/2018, 1:52 PM

## 2018-08-18 MED ORDER — TOBRAMYCIN-DEXAMETHASONE 0.3-0.1 % OP SUSP
OPHTHALMIC | Status: DC
  Administered 2018-08-18 – 2018-08-19 (×11): via OPHTHALMIC
  Filled 2018-08-18: qty 5

## 2018-08-18 MED ORDER — SODIUM CHLORIDE 0.9 % IV BOLUS
500.0000 mL | Freq: Once | INTRAVENOUS | Status: AC
  Administered 2018-08-18: 500 mL via INTRAVENOUS

## 2018-08-18 MED ORDER — FLUCONAZOLE 100 MG PO TABS
100.0000 mg | ORAL_TABLET | Freq: Every day | ORAL | Status: AC
  Administered 2018-08-18 – 2018-08-20 (×3): 100 mg via ORAL
  Filled 2018-08-18 (×3): qty 1

## 2018-08-18 MED ORDER — HYDROMORPHONE HCL 1 MG/ML IJ SOLN
1.0000 mg | Freq: Once | INTRAMUSCULAR | Status: AC
  Administered 2018-08-18: 1 mg via INTRAVENOUS
  Filled 2018-08-18: qty 1

## 2018-08-18 MED ORDER — VANCOMYCIN HCL 500 MG IV SOLR
INTRAVENOUS | Status: DC
  Administered 2018-08-18 – 2018-08-24 (×54): via OPHTHALMIC
  Filled 2018-08-18 (×2): qty 250

## 2018-08-18 MED FILL — Water For Injection: INTRAMUSCULAR | Qty: 5 | Status: AC

## 2018-08-18 MED FILL — Vancomycin HCl For IV Soln 500 MG (Base Equivalent): INTRAVENOUS | Qty: 250 | Status: AC

## 2018-08-18 NOTE — Progress Notes (Signed)
[facility] PROGRESS NOTE    Progress Note  Dominique [name]  KDX:[id number] DOB: 1981-01-19 DOA: 07/04/2018 PCP: Benito Mccreedy, MD     Brief Narrative:   Dominique [name] is an 37 y.o. female past medical history significant for morbid obesity, obstructive sleep apnea, Crohn's status post perforation with a colostomy, with a chronic abdominal wound who was in her usual state of health until she recently developed recurrent nausea and vomiting for several days with lightheadedness  Assessment/Plan:   Hypovolemia/ Orthostatic hypotension Resolved with aggressive hydration her symptoms resolved. Orthostatics status have resolved. Still awaiting placement.  She cannot take care of herself at home.  Hypovolemic hyponatremia: Resolved with IV fluid hydration.  Anemia due to acute blood loss/iron deficiency anemia: Likely due to intermittent bleeding from wound site. Iron level was 14 ferritin was 15 she status post 1 unit of packed red blood cells. Now on oral supplementation. Check hemoglobin intermittently.  Metabolic acidosis/acute kidney injury: Likely prerenal azotemia due to with IV fluids and IV bicarbonate.  Crohn's colitis with perforation s/p left colectomy/colostomy 2016: Cont. monitors to output as this may be contributing to her hypovolemia.  Sepsis due to multiple anterior trach in his wound of abdomen, peri-inguinal and peri-gluteal: Continue judicious pain control with severe pain. Wound nurse recommended reevaluation exploration of other options as an outpatient. Reconsult surgery.  Indwelling urinary catheter: Discontinued 07/15/2018.  Non-severe protein caloric malnutrition: Nutrition has been consulted they recommended Premier protein  Obstructive sleep apnea: Continue CPAP at night.  Morbid obesity with a BMI of 54: Counselin was performed.  Severe deconditioning: Physical therapy evaluated the patient amended skilled nursing facility, awaiting  placement. Not appropriate for CIR.  Sinus tachycardia: Continue metoprolol.  Bilateral eye redness: Last outpatient visit with Dr. Kerrin Champagne at week 12/2013 there was a concern for keratic conus but she never followed up. Ophthalmology evaluated relates that her hydrops was improved but not as she would like she d recommended new eyedrops vancomycin and tobramycin alternating every 60 minutes.   Ophthalmologist reevaluated her on 08/17/2018, and relates improvement in hydrops with ulceration cultures are pending.  Severe deconditioning, PT recommended skilled nursing facility still awaiting placement. Therapy saw the patient again recommended skilled nursing facility, social worker having trouble placing the patient.  Moderate nonsevere protein caloric malnutrition: has not been eating much in the hospital, admission she weighed 157.9 kg, on 08/13/2018 she was 128.6 kg, there is likely as she has a control diet in the hospital.  New infected fold wounds: Do daily dressing twice a day, started on oral Bactrim and date 08/19/2018. Surgery was consulted they recommended that they need to be dry and clean. Continue twice a day dressing changes  DVT prophylaxis: lovenox Family Communication:none Disposition Plan/Barrier to D/C: awaiting on placement from SW Code Status:     Code Status Orders  (From admission, onward)         Start     Ordered   07/04/18 2335  Full code  Continuous     07/04/18 2337        Code Status History    Date Active Date Inactive Code Status Order ID Comments User Context   05/30/2018 1310 06/03/2018 1814 Full Code [id number]  Alma Friendly, MD Inpatient   04/19/2018 1247 04/23/2018 1912 Full Code [id number]  Debbe Odea, MD ED   01/06/2017 0612 01/07/2017 0325 Full Code [id number]  Norval Morton, MD ED   12/25/2016 1257 12/29/2016 1749 Full Code [id number]  Debbe Odea,  MD Inpatient   12/25/2016 0910 12/25/2016 1257 Full Code [id number]  Debbe Odea, MD  ED   07/21/2015 0935 07/25/2015 2218 Full Code [id number]  Samella Parr, NP Inpatient   06/26/2015 1759 07/21/2015 0935 Full Code [id number]  Cathlyn Parsons, PA-C Inpatient   06/04/2015 0757 06/26/2015 1759 Full Code [id number]  Gennaro Africa, MD Inpatient   11/16/2014 2238 11/23/2014 1747 Full Code [id number]  Berle Mull, MD ED        IV Access:    Peripheral IV   Procedures and diagnostic studies:   No results found.   Medical Consultants:    None.  Anti-Infectives:   none  Subjective:    Azjah Truman no complains.  Objective:    Vitals:   08/18/18 0200 08/18/18 0300 08/18/18 0426 08/18/18 0451  BP: (!) 87/43     Pulse: (!) 103 99    Resp: 13 17    Temp:   98.3 F (36.8 C)   TempSrc:   Oral   SpO2: 99% 95%    Weight:    129.3 kg  Height:        Intake/Output Summary (Last 24 hours) at 08/18/2018 0752 Last data filed at 08/18/2018 0400 Gross per 24 hour  Intake 11.61 ml  Output 725 ml  Net -713.39 ml   Filed Weights   08/14/18 1817 08/18/18 0451  Weight: 128.6 kg 129.3 kg    Exam: General exam: In no acute distress, morbidly obese Respiratory system: Good air movement and clear to auscultation. Cardiovascular system: S1 & S2 heard, RRR. Gastrointestinal system: Abdomen is nondistended, soft and nontender.  Central nervous system: Alert and oriented. No focal neurological deficits. Extremities: No pedal edema. Psychiatry: Judgement and insight appear normal. Mood & affect appropriate.    Data Reviewed:    Labs: Basic Metabolic Panel: No results for input(s): NA, K, CL, CO2, GLUCOSE, BUN, CREATININE, CALCIUM, MG, PHOS in the last 168 hours. GFR Estimated Creatinine Clearance: 117.3 mL/min (by C-G formula based on SCr of 0.84 mg/dL). Liver Function Tests: No results for input(s): AST, ALT, ALKPHOS, BILITOT, PROT, ALBUMIN in the last 168 hours. No results for input(s): LIPASE, AMYLASE in the last 168 hours. No results for input(s):  AMMONIA in the last 168 hours. Coagulation profile No results for input(s): INR, PROTIME in the last 168 hours.  CBC: No results for input(s): WBC, NEUTROABS, HGB, HCT, MCV, PLT in the last 168 hours. Cardiac Enzymes: No results for input(s): CKTOTAL, CKMB, CKMBINDEX, TROPONINI in the last 168 hours. BNP (last 3 results) No results for input(s): PROBNP in the last 8760 hours. CBG: No results for input(s): GLUCAP in the last 168 hours. D-Dimer: No results for input(s): DDIMER in the last 72 hours. Hgb A1c: No results for input(s): HGBA1C in the last 72 hours. Lipid Profile: No results for input(s): CHOL, HDL, LDLCALC, TRIG, CHOLHDL, LDLDIRECT in the last 72 hours. Thyroid function studies: No results for input(s): TSH, T4TOTAL, T3FREE, THYROIDAB in the last 72 hours.  Invalid input(s): FREET3 Anemia work up: No results for input(s): VITAMINB12, FOLATE, FERRITIN, TIBC, IRON, RETICCTPCT in the last 72 hours. Sepsis Labs: No results for input(s): PROCALCITON, WBC, LATICACIDVEN in the last 168 hours. Microbiology Recent Results (from the past 240 hour(s))  Eye culture     Status: None (Preliminary result)   Collection Time: 08/16/18  1:00 PM  Result Value Ref Range Status   Specimen Description EYE  Final   Special Requests NONE  Final   Gram  Stain NO WBC SEEN NO ORGANISMS SEEN   Final   Culture   Final    NO GROWTH Performed at [facility], [address] [place], [place] [address]    Report Status PENDING  Incomplete  MRSA PCR Screening     Status: None   Collection Time: 08/16/18  7:14 PM  Result Value Ref Range Status   MRSA by PCR NEGATIVE NEGATIVE Final    Comment:        The GeneXpert MRSA Assay (FDA approved for NASAL specimens only), is one component of a comprehensive MRSA colonization surveillance program. It is not intended to diagnose MRSA infection nor to guide or monitor treatment for MRSA infections. Performed at [facility], [place] [address], [place], [place] [address]      Medications:   . acetaminophen  650 mg Oral Q6H  . ferrous gluconate  324 mg Oral BID WC  . metoprolol tartrate  50 mg Oral BID  . multivitamin with minerals  1 tablet Oral Daily  . sulfamethoxazole-trimethoprim  1 tablet Oral Q12H   Continuous Infusions: . sodium chloride Stopped (07/23/18 1100)  . fluconazole (DIFLUCAN) IV Stopped (08/17/18 1219)  . tobramycin (TOBREX) 0.3 % 100 drop, tobramycin (NEBCIN) 80 mg    . vancomycin (VANCOCIN) 250 mg, sterile water (preservative free) 5 mL        LOS: 43 days   [facility] Pager [phone]  *Please refer to [email or web address], password [email or web address] to get updated schedule on who will round on this patient, as hospitalists switch teams weekly. If 7PM-7AM, please contact night-coverage at [email or web address], password [email or web address] for any overnight needs.  08/18/2018, 7:52 AM

## 2018-08-18 NOTE — Progress Notes (Signed)
OT Cancellation Note  Patient Details Name: Dominique [name] MRN: [id number] DOB: [birth year]   Cancelled Treatment:    Reason Eval/Treat Not Completed: Other (comment).  RN in with pt. Will check back another day  Taira Knabe 08/18/2018, 4:03 PM  [name], OTR/L Acute Rehabilitation Services [phone] [facility] pager [phone] office 08/18/2018

## 2018-08-18 NOTE — Progress Notes (Signed)
PHARMACIST - PHYSICIAN COMMUNICATION DR:   Aileen Fass CONCERNING: Antibiotic IV to Oral Route Change Policy  RECOMMENDATION: This patient is receiving Fluconazole by the intravenous route.  Based on criteria approved by the Pharmacy and Therapeutics Committee, the antibiotic(s) is/are being converted to the equivalent oral dose form(s).   DESCRIPTION: These criteria include:  Patient being treated for a respiratory tract infection, urinary tract infection, cellulitis or clostridium difficile associated diarrhea if on metronidazole  The patient is not neutropenic and does not exhibit a GI malabsorption state  The patient is eating (either orally or via tube) and/or has been taking other orally administered medications for a least 24 hours  The patient is improving clinically and has a Tmax < 100.5  If you have questions about this conversion, please contact the Pharmacy Department    [phone])  [facility] PharmD, [place] Pager [phone] 08/18/2018 8:13 AM

## 2018-08-19 MED ORDER — PREDNISOLONE ACETATE 1 % OP SUSP
1.0000 [drp] | Freq: Two times a day (BID) | OPHTHALMIC | Status: DC
  Administered 2018-08-19 – 2018-08-24 (×10): 1 [drp] via OPHTHALMIC
  Filled 2018-08-19: qty 5

## 2018-08-19 MED ORDER — TOBRAMYCIN SULFATE 80 MG/2ML IJ SOLN
OPHTHALMIC | Status: DC
  Administered 2018-08-19 – 2018-08-23 (×34): via OPHTHALMIC
  Filled 2018-08-19 (×5): qty 5

## 2018-08-19 MED ORDER — HYDROMORPHONE HCL 1 MG/ML IJ SOLN
1.0000 mg | Freq: Two times a day (BID) | INTRAMUSCULAR | Status: DC | PRN
  Administered 2018-08-19 – 2018-08-24 (×10): 1 mg via INTRAVENOUS
  Filled 2018-08-19 (×10): qty 1

## 2018-08-19 NOTE — Progress Notes (Signed)
Follow-up corneal hydrops/ulcer OS  Eye meds:  Fortified Vancomycin and fortified tobramycin OS q 2 hours while awake (altnerating q 1 hour)  Cultures pending.  No growth yet.    Subjective:  Patient reports eyes are doing much better!  Vision:  OD:  CF at 2 feet, OS:  HM  Bedside exam:  OD:  No injection of conjunctiva (except at inferior limbus where there is KNV).  Inferior-central scar with intact epithelium and NV  OS:  Conjunctiva less injected. (2-3+ mostly inferior),  Inferior-central opacity continues to clear.  + NV of inferior cornea.   AC formed and without hypopyon.  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  (Improving)  Plan:   Continue current drops.  Add prednisolone 1% one drop Left eye bid.  Thank you, nurses for helping with orders.

## 2018-08-19 NOTE — Progress Notes (Signed)
[facility] PROGRESS NOTE    Progress Note  Dominique [name]  YIF:[id number] DOB: 07/17/1981 DOA: 07/04/2018 PCP: Benito Mccreedy, MD     Brief Narrative:   Dominique [name] is an 37 y.o. female past medical history significant for morbid obesity, obstructive sleep apnea, Crohn's status post perforation with a colostomy, with a chronic abdominal wound who was in her usual state of health until she recently developed recurrent nausea and vomiting for several days with lightheadedness  Assessment/Plan:   Hypovolemia/ Orthostatic hypotension Resolved with aggressive hydration her symptoms resolved. Orthostatics status have resolved. Still awaiting placement.  She cannot take care of herself at home.  Hypovolemic hyponatremia: Resolved with IV fluid hydration.  Anemia due to acute blood loss/iron deficiency anemia: Likely due to intermittent bleeding from wound site. Iron level was 14 ferritin was 15 she status post 1 unit of packed red blood cells. Now on oral supplementation. Check hemoglobin intermittently.  Metabolic acidosis/acute kidney injury: Likely prerenal azotemia due to with IV fluids and IV bicarbonate.  Crohn's colitis with perforation s/p left colectomy/colostomy 2016: Cont. monitors to output as this may be contributing to her hypovolemia.  Sepsis due to multiple anterior trach in his wound of abdomen, peri-inguinal and peri-gluteal: Continue judicious pain contro. Wound nurse recommended reevaluation exploration of other options as an outpatient. Reconsult surgery.  Indwelling urinary catheter: Discontinued 07/15/2018.  Non-severe protein caloric malnutrition: Nutrition has been consulted they recommended Premier protein  Obstructive sleep apnea: Continue CPAP at night.  Morbid obesity with a BMI of 54: Counselin was performed.  Severe deconditioning: Physical therapy evaluated the patient amended skilled nursing facility, awaiting placement. Not  appropriate for CIR.  Sinus tachycardia: Continue metoprolol.  Bilateral eye redness: Last outpatient visit with Dr. Kerrin Champagne at week 12/2013 there was a concern for keratic conus but she never followed up. Ophthalmology evaluated relates that her hydrops was improved with eyedrops, but not as she would like she d recommended new eyedrops vancomycin and tobramycin alternating every 60 minutes.   Ophthalmologist reevaluated her on 08/17/2018, and relates improvement in hydrops with ulceration cultures are pending. From neurology will continue to follow up.  Severe deconditioning, PT recommended skilled nursing facility still awaiting placement. Therapy saw the patient again recommended skilled nursing facility, social worker having trouble placing the patient.  Moderate nonsevere protein caloric malnutrition: has not been eating much in the hospital, admission she weighed 157.9 kg, on 08/13/2018 she was 128.6 kg, there is likely as she has a control diet in the hospital.  New infected fold wounds: Do daily dressing twice a day, started on oral Bactrim and date 08/19/2018. Surgery was consulted they recommended that they need to be dry and clean. Continue twice a day dressing changes, these are painful we will have to use IV pain medication intermittently.  DVT prophylaxis: lovenox Family Communication:none Disposition Plan/Barrier to D/C: awaiting on placement from SW Code Status:     Code Status Orders  (From admission, onward)         Start     Ordered   07/04/18 2335  Full code  Continuous     07/04/18 2337        Code Status History    Date Active Date Inactive Code Status Order ID Comments User Context   05/30/2018 1310 06/03/2018 1814 Full Code [id number]  Alma Friendly, MD Inpatient   04/19/2018 1247 04/23/2018 1912 Full Code [id number]  Debbe Odea, MD ED   01/06/2017 0612 01/07/2017 0325 Full Code [id number]  Norval Morton, MD ED   12/25/2016 1257 12/29/2016 1749  Full Code [id number]  Debbe Odea, MD Inpatient   12/25/2016 0910 12/25/2016 1257 Full Code [id number]  Debbe Odea, MD ED   07/21/2015 0935 07/25/2015 2218 Full Code [id number]  Samella Parr, NP Inpatient   06/26/2015 1759 07/21/2015 0935 Full Code [id number]  Cathlyn Parsons, PA-C Inpatient   06/04/2015 0757 06/26/2015 1759 Full Code [id number]  Gennaro Africa, MD Inpatient   11/16/2014 2238 11/23/2014 1747 Full Code [id number]  Berle Mull, MD ED        IV Access:    Peripheral IV   Procedures and diagnostic studies:   No results found.   Medical Consultants:    None.  Anti-Infectives:   none  Subjective:    Yareni Delpilar no complains.  Objective:    Vitals:   08/19/18 0413 08/19/18 0445 08/19/18 0452 08/19/18 0500  BP:  (!) 112/42  (!) 100/57  Pulse:  (!) 110  98  Resp:  16  15  Temp: 98.4 F (36.9 C)     TempSrc: Oral     SpO2:  100%  93%  Weight:   132 kg   Height:        Intake/Output Summary (Last 24 hours) at 08/19/2018 0726 Last data filed at 08/19/2018 0655 Gross per 24 hour  Intake 1242.73 ml  Output 525 ml  Net 717.73 ml   Filed Weights   08/14/18 1817 08/18/18 0451 08/19/18 0452  Weight: 128.6 kg 129.3 kg 132 kg    Exam: General exam: In no acute distress, morbidly obese Respiratory system: Good air movement and clear to auscultation. Cardiovascular system: S1 & S2 heard, RRR. Gastrointestinal system: Abdomen is nondistended, soft and nontender.  Central nervous system: Alert and oriented. No focal neurological deficits. Extremities: No pedal edema. Psychiatry: Judgement and insight appear normal. Mood & affect appropriate.    Data Reviewed:    Labs: Basic Metabolic Panel: No results for input(s): NA, K, CL, CO2, GLUCOSE, BUN, CREATININE, CALCIUM, MG, PHOS in the last 168 hours. GFR Estimated Creatinine Clearance: 118.8 mL/min (by C-G formula based on SCr of 0.84 mg/dL). Liver Function Tests: No results for input(s): AST, ALT,  ALKPHOS, BILITOT, PROT, ALBUMIN in the last 168 hours. No results for input(s): LIPASE, AMYLASE in the last 168 hours. No results for input(s): AMMONIA in the last 168 hours. Coagulation profile No results for input(s): INR, PROTIME in the last 168 hours.  CBC: No results for input(s): WBC, NEUTROABS, HGB, HCT, MCV, PLT in the last 168 hours. Cardiac Enzymes: No results for input(s): CKTOTAL, CKMB, CKMBINDEX, TROPONINI in the last 168 hours. BNP (last 3 results) No results for input(s): PROBNP in the last 8760 hours. CBG: No results for input(s): GLUCAP in the last 168 hours. D-Dimer: No results for input(s): DDIMER in the last 72 hours. Hgb A1c: No results for input(s): HGBA1C in the last 72 hours. Lipid Profile: No results for input(s): CHOL, HDL, LDLCALC, TRIG, CHOLHDL, LDLDIRECT in the last 72 hours. Thyroid function studies: No results for input(s): TSH, T4TOTAL, T3FREE, THYROIDAB in the last 72 hours.  Invalid input(s): FREET3 Anemia work up: No results for input(s): VITAMINB12, FOLATE, FERRITIN, TIBC, IRON, RETICCTPCT in the last 72 hours. Sepsis Labs: No results for input(s): PROCALCITON, WBC, LATICACIDVEN in the last 168 hours. Microbiology Recent Results (from the past 240 hour(s))  Eye culture     Status: None (Preliminary result)   Collection Time: 08/16/18  1:00 PM  Result Value Ref Range Status   Specimen Description EYE  Final   Special Requests NONE  Final   Gram Stain NO WBC SEEN NO ORGANISMS SEEN   Final   Culture   Final    NO GROWTH 1 DAY Performed at [facility], [place] [address], [place], [place] [address]    Report Status PENDING  Incomplete  MRSA PCR Screening     Status: None   Collection Time: 08/16/18  7:14 PM  Result Value Ref Range Status   MRSA by PCR NEGATIVE NEGATIVE Final    Comment:        The GeneXpert MRSA Assay (FDA approved for NASAL specimens only), is one component of a comprehensive MRSA colonization surveillance  program. It is not intended to diagnose MRSA infection nor to guide or monitor treatment for MRSA infections. Performed at [facility], [place] [address] [place], [place] [address]      Medications:   . acetaminophen  650 mg Oral Q6H  . ferrous gluconate  324 mg Oral BID WC  . fluconazole  100 mg Oral Daily  . metoprolol tartrate  50 mg Oral BID  . multivitamin with minerals  1 tablet Oral Daily  . sulfamethoxazole-trimethoprim  1 tablet Oral Q12H   Continuous Infusions: . sodium chloride Stopped (07/23/18 1100)  . Tobramycin/Dexamethasone (TOBREX), tobramycin 15 mg/mL compounded eye drops    . Vancomycin 25 mg/ml compounded eye drops        LOS: 44 days   [facility] Pager [phone]  *Please refer to [email or web address], password [email or web address] to get updated schedule on who will round on this patient, as hospitalists switch teams weekly. If 7PM-7AM, please contact night-coverage at [email or web address], password [email or web address] for any overnight needs.  08/19/2018, 7:26 AM

## 2018-08-19 NOTE — Progress Notes (Signed)
Nutrition Follow-up  DOCUMENTATION CODES:   Non-severe (moderate) malnutrition in context of chronic illness, Morbid obesity  INTERVENTION:  - Continue Magic Cup once/day. - Continue to encourage PO intakes.   NUTRITION DIAGNOSIS:   Moderate Malnutrition related to chronic illness as evidenced by energy intake < or equal to 75% for > or equal to 1 month, percent weight loss. -ongoing  GOAL:   Patient will meet greater than or equal to 90% of their needs -unmet  MONITOR:   PO intake, Supplement acceptance, Weight trends, Labs, Skin  ASSESSMENT:   37 y.o. female past medical history significant for obesity, OSA, Crohn's disease s/p perforation with colostomy, chronic wound. She was in her usual health state of health until she developed recurrent nausea and vomiting several days ago, she is not lightheaded. Patient admitted with diagnosis of hypovolemia.   Current weight consistent with weight 11/5-11/14; weight was then trending down slightly. Patient last seen by RD on 11/22. Since that time, documentation of intakes has been limited--only recorded for 11/24 when patient refused all meals.  When asked about appetite and eating, patient reports "it is going about the same." She denies abdominal pain or nausea this AM. Two [employer] bags on bedside table which patient reports was from dinner last night. She denies having anything for breakfast this AM.  Per Dr. Barbra Sarks note this AM: anemia d/t acute blood loss/iron deficiency thought to be d/t intermittent bleeding from wounds, metabolic acidosis/AKI, Crohn's colitis s/p ileostomy, sepsis d/t wounds with plan to re-consult Surgery, severe deconditioning with PT recommending SNF--awaiting placement, newly infected skin fold wounds and started on oral Bactrim today.    Medications reviewed; daily multivitamin with minerals.  Labs reviewed; Ca: 8.5 mg/dL.    Diet Order:   Diet Order            Diet regular Room service  appropriate? Yes; Fluid consistency: Thin  Diet effective now        Diet - low sodium heart healthy        Diet - low sodium heart healthy              EDUCATION NEEDS:   Not appropriate for education at this time  Skin:  Skin Assessment: Skin Integrity Issues: Skin Integrity Issues:: Other (Comment) Other: per flowsheet, non-pressure wounds to: abdomen, upper and lower abdomen, L breast, bilateral groins, sacrum, and bilateral lower back.  Last BM:  11/27  Height:   Ht Readings from Last 1 Encounters:  07/04/18 5' 2"  (1.575 m)    Weight:   Wt Readings from Last 1 Encounters:  08/19/18 132 kg    Ideal Body Weight:  50 kg  BMI:  Body mass index is 53.22 kg/m.  Estimated Nutritional Needs:   Kcal:  2230-2510 (16-18 kcal/kg)  Protein:  135-145 grams  Fluid:  >/= 2.3 L/day     Jarome Matin, MS, RD, LDN, [facility] Inpatient Clinical Dietitian Pager # [phone] After hours/weekend pager # [phone]

## 2018-08-19 NOTE — Care Management Note (Signed)
Case Management Note  Patient Details  Name: Dominique [name] MRN: [id number] Date of Birth: 03-28-1981  Subjective/Objective:                  37 y.o. female past medical history significant for morbid obesity, obstructive sleep apnea, Crohn's status post perforation with a colostomy, with a chronic abdominal wound who was in her usual state of health until she recently developed recurrent nausea and vomiting for several days with lightheadedness  Assessment/Plan:   Hypovolemia/ Orthostatic hypotension Resolved with aggressive hydration her symptoms resolved. Orthostatics status have resolved. Still awaiting placement.  She cannot take care of herself at home.  Hypovolemic hyponatremia: Resolved with IV fluid hydration.  Anemia due to acute blood loss/iron deficiency anemia: Likely due to intermittent bleeding from wound site. Iron level was 14 ferritin was 15 she status post 1 unit of packed red blood cells. Now on oral supplementation. Check hemoglobin intermittently.  Metabolic acidosis/acute kidney injury: Likely prerenal azotemia due to with IV fluids and IV bicarbonate.  Crohn's colitis with perforation s/p left colectomy/colostomy 2016: Cont. monitors to output as this may be contributing to her hypovolemia.  Sepsis due to multiple anterior trach in his wound of abdomen, peri-inguinal and peri-gluteal: Continue judicious pain contro. Wound nurse recommended reevaluation exploration of other options as an outpatient. Reconsult surgery.  Indwelling urinary catheter: Discontinued 07/15/2018.  Non-severe protein caloric malnutrition: Nutrition has been consulted they recommended Premier protein  Obstructive sleep apnea: Continue CPAP at night.  Morbid obesity with a BMI of 54: Counselin was performed.  Severe deconditioning: Physical therapy evaluated the patient amended skilled nursing facility, awaiting placement. Not appropriate for CIR.  Sinus  tachycardia: Continue metoprolol.  Bilateral eye redness: Last outpatient visit with Dr. Kerrin Champagne at week 12/2013 there was a concern for keratic conus but she never followed up. Ophthalmology evaluated relates that her hydrops was improved with eyedrops, but not as she would like she d recommended new eyedrops vancomycin and tobramycin alternating every 60 minutes.   Ophthalmologist reevaluated her on 08/17/2018, and relates improvement in hydrops with ulceration cultures are pending. From neurology will continue to follow up.  Severe deconditioning, PT recommended skilled nursing facility still awaiting placement. Therapy saw the patient again recommended skilled nursing facility, social worker having trouble placing the patient.  Moderate nonsevere protein caloric malnutrition: has not been eating much in the hospital, admission she weighed 157.9 kg, on 08/13/2018 she was 128.6 kg, there is likely as she has a control diet in the hospital.  New infected fold wounds: Do daily dressing twice a day, started on oral Bactrim and date 08/19/2018. Surgery was consulted they recommended that they need to be dry and clean. Continue twice a day dressing changes, these are painful we will have to use IV pain medication intermittently.   Action/Plan: Awaiting snf placement was discuseed in los meeting  Expected Discharge Date:  07/31/18               Expected Discharge Plan:  [place]  In-House Referral:  Clinical Social Work  Discharge planning Services  CM Consult  Post Acute Care Choice:  Home Health(Active w/[facility]) Choice offered to:     DME Arranged:    DME Agency:     HH Arranged:    [place]:     Status of Service:  Completed, signed off  If discussed at [employer] of Stay Meetings, dates discussed:   [id number] Additional Comments:  Leeroy Cha,  RN 08/19/2018, 9:19 AM

## 2018-08-20 LAB — CBC
HCT: 38.2 % (ref 36.0–46.0)
Hemoglobin: 12 g/dL (ref 12.0–15.0)
MCH: 26.3 pg (ref 26.0–34.0)
MCHC: 31.4 g/dL (ref 30.0–36.0)
MCV: 83.6 fL (ref 80.0–100.0)
NRBC: 0 % (ref 0.0–0.2)
PLATELETS: 206 10*3/uL (ref 150–400)
RBC: 4.57 MIL/uL (ref 3.87–5.11)
RDW: 17.8 % — AB (ref 11.5–15.5)
WBC: 7.4 10*3/uL (ref 4.0–10.5)

## 2018-08-20 LAB — EYE CULTURE
CULTURE: NO GROWTH
GRAM STAIN: NONE SEEN

## 2018-08-20 LAB — LACTIC ACID, PLASMA
LACTIC ACID, VENOUS: 2.4 mmol/L — AB (ref 0.5–1.9)
LACTIC ACID, VENOUS: 1.8 mmol/L (ref 0.5–1.9)

## 2018-08-20 MED ORDER — SODIUM CHLORIDE 0.9 % IV BOLUS
1000.0000 mL | Freq: Once | INTRAVENOUS | Status: AC
  Administered 2018-08-20: 1000 mL via INTRAVENOUS

## 2018-08-20 NOTE — Progress Notes (Signed)
PROGRESS NOTE    Dominique [name]  VFI:[id number] DOB: Jan 24, 1981 DOA: 07/04/2018 PCP: Dominique Mccreedy, MD   Brief Narrative: Dominique [name] is a 37 y.o. 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 Patient presented with vomiting/poor appetite found to have hypovolemia, wound infections and anemia.    Assessment & Plan:   Principal Problem:   Hypovolemia Active Problems:   Orthostatic hypotension   Crohn's colitis with perforation s/p left colectomy/colostomy 2016   OSA on CPAP   Chronic anemia   Multiple wounds of skin   Mild renal insufficiency   Hyponatremia   Malnutrition of moderate degree   Dehydration   Hypovolemia Unknown etiology. Associated tachycardia. Given fluids yesterday. No associated leukocytosis -NS bolus followed by NS IV fluids -Check lactic acid and repeat if elevated -Strict in/out  Hypovolemic hyponatremia Resolved with IV fluids.  Anemia Acute blood loss from wound site and iron deficiency. Patient has received a total of 1 units PRBC. Hemoglobin stable.  Metabolic acidosis AKI Secondary to prerenal azotemia. Resolved.  Crohn colitis History of perforation s/p left colectomy/colostomy.   Sepsis Secondary to wounds. Sepsis physiology initially resolved. Now with recurrent hypovolemia/tachycardia with wound as infectious source, although completed recent antibiotic treatment  Corneal hydrops/ulcer Ophthalmology consulted. -Ophthalmology recommendations: continue prednisone 1 drop to left eye BID and tobramycin/vancomycin every 2 hours while awake in left eye  Severe deconditioning -Discharge to SNF when medically stable  Multiple wounds Most recently, inguinal wound   DVT prophylaxis: SCDs Code Status:   Code Status: Full Code Family  Communication: None Disposition Plan: Discharge to SNF when medically stable   Consultants:   Ophthalmology  General surgery  Procedures:   None  Antimicrobials:  Bactrim (11/21>>11/27)  Tobramycin eye drops (11/24>>  Vancomycin eye drops (11/24>>   Subjective: No issues overnight per patient.  Objective: Vitals:   08/20/18 0500 08/20/18 0633 08/20/18 0730 08/20/18 0800  BP:  (!) 97/52  (!) 97/48  Pulse:  (!) 140 (!) 134 (!) 140  Resp:  (!) 22 16 19   Temp:      TempSrc:      SpO2:  99% 97% 99%  Weight: 130.2 kg     Height:       No intake or output data in the 24 hours ending 08/20/18 0828 Filed Weights   08/18/18 0451 08/19/18 0452 08/20/18 0500  Weight: 129.3 kg 132 kg 130.2 kg    Examination:  General exam: Appears calm and comfortable Respiratory system: Clear to auscultation. Respiratory effort normal. Cardiovascular system: S1 & S2 heard, Tachycardia, regular rhythm. No murmurs, rubs, gallops or clicks. Gastrointestinal system: Abdomen is nondistended, soft and nontender. No organomegaly or masses felt. Normal bowel sounds heard. Ostomy bag with liquid brown stool Central nervous system: Alert and oriented. No focal neurological deficits. Extremities: No edema. No calf tenderness Skin: No cyanosis. No rashes Psychiatry: Judgement and insight appear normal. Mood & affect appropriate.     Data Reviewed: I have personally reviewed following labs and imaging studies  CBC: No results for input(s): WBC, NEUTROABS, HGB, HCT, MCV, PLT in the last 168 hours. Basic Metabolic Panel: No results for input(s): NA, K, CL, CO2, GLUCOSE, BUN, CREATININE, CALCIUM, MG, PHOS in the last 168 hours. GFR: Estimated Creatinine Clearance: 117.7 mL/min (by C-G formula based on SCr of 0.84 mg/dL). Liver Function Tests: No  results for input(s): AST, ALT, ALKPHOS, BILITOT, PROT, ALBUMIN in the last 168 hours. No results for input(s): LIPASE, AMYLASE in the last 168  hours. No results for input(s): AMMONIA in the last 168 hours. Coagulation Profile: No results for input(s): INR, PROTIME in the last 168 hours. Cardiac Enzymes: No results for input(s): CKTOTAL, CKMB, CKMBINDEX, TROPONINI in the last 168 hours. BNP (last 3 results) No results for input(s): PROBNP in the last 8760 hours. HbA1C: No results for input(s): HGBA1C in the last 72 hours. CBG: No results for input(s): GLUCAP in the last 168 hours. Lipid Profile: No results for input(s): CHOL, HDL, LDLCALC, TRIG, CHOLHDL, LDLDIRECT in the last 72 hours. Thyroid Function Tests: No results for input(s): TSH, T4TOTAL, FREET4, T3FREE, THYROIDAB in the last 72 hours. Anemia Panel: No results for input(s): VITAMINB12, FOLATE, FERRITIN, TIBC, IRON, RETICCTPCT in the last 72 hours. Sepsis Labs: No results for input(s): PROCALCITON, LATICACIDVEN in the last 168 hours.  Recent Results (from the past 240 hour(s))  Eye culture     Status: None (Preliminary result)   Collection Time: 08/16/18  1:00 PM  Result Value Ref Range Status   Specimen Description EYE  Final   Special Requests NONE  Final   Gram Stain NO WBC SEEN NO ORGANISMS SEEN   Final   Culture   Final    NO GROWTH 2 DAYS Performed at [facility], [address] [place], [place] [address]    Report Status PENDING  Incomplete  MRSA PCR Screening     Status: None   Collection Time: 08/16/18  7:14 PM  Result Value Ref Range Status   MRSA by PCR NEGATIVE NEGATIVE Final    Comment:        The GeneXpert MRSA Assay (FDA approved for NASAL specimens only), is one component of a comprehensive MRSA colonization surveillance program. It is not intended to diagnose MRSA infection nor to guide or monitor treatment for MRSA infections. Performed at [facility], [place] [address], [place], [place] [address]          Radiology Studies: No results found.      Scheduled Meds: . acetaminophen  650 mg Oral  Q6H  . ferrous gluconate  324 mg Oral BID WC  . fluconazole  100 mg Oral Daily  . metoprolol tartrate  50 mg Oral BID  . multivitamin with minerals  1 tablet Oral Daily  . prednisoLONE acetate  1 drop Left Eye BID   Continuous Infusions: . sodium chloride Stopped (07/23/18 1100)  . sodium chloride    . Tobramycin 15 mg/mL compounded eye drops       . Vancomycin 25 mg/ml compounded eye drops       LOS: 45 days     Cordelia Poche, MD [facility] 08/20/2018, 8:28 AM  If 7PM-7AM, please contact night-coverage [email or web address]

## 2018-08-20 NOTE — Progress Notes (Signed)
MD aware of patient's HR sustaining in 130s. ECG done.

## 2018-08-20 NOTE — Progress Notes (Signed)
CRITICAL VALUE ALERT  Critical Value:  Lactic Acid 2.4  Date & Time Notied:  1600  Provider Notified: Dr. Lonny Prude  Orders Received/Actions taken: MD aware

## 2018-08-20 NOTE — Progress Notes (Signed)
Social Work/ Case Management team aware of this patient. Patient needs SNF placement and at this time does not have any bed offers due to several barriers. CSW/ CSW leadership is working diligently to find a safe disposition for this patient.   CSW will continue to follow this patient and inform medical staff when a SNF bed is available.   Kathrin Greathouse, Marlinda Mike, MSW Clinical Social Worker  [phone] 08/20/2018  1:48 PM

## 2018-08-21 LAB — BASIC METABOLIC PANEL
ANION GAP: 13 (ref 5–15)
ANION GAP: 4 — AB (ref 5–15)
BUN: 9 mg/dL (ref 6–20)
BUN: 6 mg/dL (ref 6–20)
CHLORIDE: 130 mmol/L — AB (ref 98–111)
CHLORIDE: 109 mmol/L (ref 98–111)
CO2: 14 mmol/L — AB (ref 22–32)
CO2: 22 mmol/L (ref 22–32)
Calcium: 8.8 mg/dL — ABNORMAL LOW (ref 8.9–10.3)
Calcium: 8.2 mg/dL — ABNORMAL LOW (ref 8.9–10.3)
Creatinine, Ser: 1.15 mg/dL — ABNORMAL HIGH (ref 0.44–1.00)
Creatinine, Ser: 0.8 mg/dL (ref 0.44–1.00)
GFR calc Af Amer: >60 mL/min (ref >60)
GFR calc Af Amer: >60 mL/min (ref >60)
GFR calc non Af Amer: >60 mL/min (ref >60)
GFR calc non Af Amer: >60 mL/min (ref >60)
GLUCOSE: 96 mg/dL (ref 70–99)
GLUCOSE: 99 mg/dL (ref 70–99)
POTASSIUM: 5.5 mmol/L — AB (ref 3.5–5.1)
POTASSIUM: 3.8 mmol/L (ref 3.5–5.1)
Sodium: 157 mmol/L — ABNORMAL HIGH (ref 135–145)
Sodium: 135 mmol/L (ref 135–145)

## 2018-08-21 MED ORDER — DEXTROSE-NACL 5-0.45 % IV SOLN
INTRAVENOUS | Status: DC
  Administered 2018-08-21: 08:00:00 via INTRAVENOUS

## 2018-08-21 MED ORDER — SODIUM CHLORIDE 0.9 % IV SOLN
Freq: Once | INTRAVENOUS | Status: AC
  Administered 2018-08-21: 17:00:00 via INTRAVENOUS

## 2018-08-21 NOTE — Progress Notes (Signed)
PROGRESS NOTE    Dominique [name]  [id number]:[id number] DOB: Mar 30, 1981 DOA: 07/04/2018 PCP: Benito Mccreedy, MD   Brief Narrative: Dominique [name] is a 37 y.o. 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 Patient presented with vomiting/poor appetite found to have hypovolemia, wound infections and anemia.    Assessment & Plan:   Principal Problem:   Hypovolemia Active Problems:   Orthostatic hypotension   Crohn's colitis with perforation s/p left colectomy/colostomy 2016   OSA on CPAP   Chronic anemia   Multiple wounds of skin   Mild renal insufficiency   Hyponatremia   Malnutrition of moderate degree   Dehydration   Hypovolemia Unknown etiology. Associated tachycardia. Improved with IV fluids -Strict in/out -Orthostatic vitals  Hypovolemic hyponatremia Resolved with IV fluids.  Hypernatremia Patient with poor oral intake which is likely cause. -D5 1/2 NS with q6 hour BMP  Anemia Acute blood loss from wound site and iron deficiency. Patient has received a total of 1 units PRBC. Hemoglobin stable.  Metabolic acidosis AKI Secondary to prerenal azotemia. Resolved.  Crohn colitis History of perforation s/p left colectomy/colostomy.   Sepsis Secondary to wounds. Sepsis physiology initially resolved. Now with recurrent hypovolemia/tachycardia with wound as infectious source, although completed recent antibiotic treatment  Corneal hydrops/ulcer Ophthalmology consulted. -Ophthalmology recommendations: continue prednisone 1 drop to left eye BID and tobramycin/vancomycin every 2 hours while awake in left eye  Severe deconditioning -Discharge to SNF when medically stable  Multiple wounds Most recently, inguinal wound   DVT prophylaxis: SCDs Code Status:   Code Status: Full  Code Family Communication: None Disposition Plan: Discharge to SNF when medically stable and when bed available   Consultants:   Ophthalmology  General surgery  Procedures:   None  Antimicrobials:  Bactrim (11/21>>11/27)  Tobramycin eye drops (11/24>>  Vancomycin eye drops (11/24>>   Subjective: No concerns. States she has not be eating/drinking well.  Objective: Vitals:   08/21/18 0800 08/21/18 0826 08/21/18 1134 08/21/18 1200  BP:  101/66 119/79   Pulse:  99    Resp:  17 (!) 27   Temp: 98.4 F (36.9 C)   98.3 F (36.8 C)  TempSrc: Oral   Oral  SpO2:  100%    Weight:      Height:        Intake/Output Summary (Last 24 hours) at 08/21/2018 1552 Last data filed at 08/21/2018 1200 Gross per 24 hour  Intake 740.46 ml  Output 700 ml  Net 40.46 ml   Filed Weights   08/19/18 0452 08/20/18 0500 08/21/18 0458  Weight: 132 kg 130.2 kg 128.4 kg    Examination:  General exam: Appears calm and comfortable Respiratory system: Clear to auscultation. Respiratory effort normal. Cardiovascular system: S1 & S2 heard, RRR. Systolic murmur Gastrointestinal system: Abdomen is nondistended, soft and nontender. No organomegaly or masses felt. Normal bowel sounds heard. Central nervous system: Alert and oriented. No focal neurological deficits. Extremities: No calf tenderness Skin: No cyanosis. No rashes Psychiatry: Judgement and insight appear normal. Flat affect   Data Reviewed: I have personally reviewed following labs and imaging studies  CBC: Recent Labs  Lab 08/20/18 1920  WBC 7.4  HGB 12.0  HCT 38.2  MCV 83.6  PLT 347   Basic Metabolic Panel: Recent Labs  Lab 08/20/18 1920 08/21/18 1152  NA 157* 135  K 5.5*  3.8  CL 130* 109  CO2 14* 22  GLUCOSE 96 99  BUN 9 6  CREATININE 1.15* 0.80  CALCIUM 8.8* 8.2*   GFR: Estimated Creatinine Clearance: 122.5 mL/min (by C-G formula based on SCr of 0.8 mg/dL). Liver Function Tests: No results for input(s):  AST, ALT, ALKPHOS, BILITOT, PROT, ALBUMIN in the last 168 hours. No results for input(s): LIPASE, AMYLASE in the last 168 hours. No results for input(s): AMMONIA in the last 168 hours. Coagulation Profile: No results for input(s): INR, PROTIME in the last 168 hours. Cardiac Enzymes: No results for input(s): CKTOTAL, CKMB, CKMBINDEX, TROPONINI in the last 168 hours. BNP (last 3 results) No results for input(s): PROBNP in the last 8760 hours. HbA1C: No results for input(s): HGBA1C in the last 72 hours. CBG: No results for input(s): GLUCAP in the last 168 hours. Lipid Profile: No results for input(s): CHOL, HDL, LDLCALC, TRIG, CHOLHDL, LDLDIRECT in the last 72 hours. Thyroid Function Tests: No results for input(s): TSH, T4TOTAL, FREET4, T3FREE, THYROIDAB in the last 72 hours. Anemia Panel: No results for input(s): VITAMINB12, FOLATE, FERRITIN, TIBC, IRON, RETICCTPCT in the last 72 hours. Sepsis Labs: Recent Labs  Lab 08/20/18 1517 08/20/18 1920  LATICACIDVEN 2.4* 1.8    Recent Results (from the past 240 hour(s))  Eye culture     Status: None   Collection Time: 08/16/18  1:00 PM  Result Value Ref Range Status   Specimen Description EYE  Final   Special Requests NONE  Final   Gram Stain NO WBC SEEN NO ORGANISMS SEEN   Final   Culture   Final    NO GROWTH 3 DAYS Performed at [facility], [address], [place], [place] [address]    Report Status 08/20/2018 FINAL  Final  MRSA PCR Screening     Status: None   Collection Time: 08/16/18  7:14 PM  Result Value Ref Range Status   MRSA by PCR NEGATIVE NEGATIVE Final    Comment:        The GeneXpert MRSA Assay (FDA approved for NASAL specimens only), is one component of a comprehensive MRSA colonization surveillance program. It is not intended to diagnose MRSA infection nor to guide or monitor treatment for MRSA infections. Performed at [facility], [place] [address] [place], [place] [address]           Radiology Studies: No results found.      Scheduled Meds: . acetaminophen  650 mg Oral Q6H  . ferrous gluconate  324 mg Oral BID WC  . metoprolol tartrate  50 mg Oral BID  . multivitamin with minerals  1 tablet Oral Daily  . prednisoLONE acetate  1 drop Left Eye BID   Continuous Infusions: . sodium chloride Stopped (07/23/18 1100)  . dextrose 5 % and 0.45% NaCl 100 mL/hr at 08/21/18 1100  . Tobramycin 15 mg/mL compounded eye drops       . Vancomycin 25 mg/ml compounded eye drops       LOS: 46 days     Cordelia Poche, MD [facility] 08/21/2018, 3:52 PM  If 7PM-7AM, please contact night-coverage [email or web address]

## 2018-08-21 NOTE — Progress Notes (Signed)
Follow-up corneal ulcer OS  Subjective:  No eye pain.  Left eye is "just tender".  Vision: OD:  CF  OS:  HM  OD:  Clear conjunctiva, Stable Inferior-central scar with NV.  AC formed  OS:  Conjunctival injection continues to clear.  Tear film clear.  Inferior-central ulcer continues to clear.  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

## 2018-08-22 LAB — BASIC METABOLIC PANEL
ANION GAP: 5 (ref 5–15)
Anion gap: 5 (ref 5–15)
BUN: 7 mg/dL (ref 6–20)
BUN: 6 mg/dL (ref 6–20)
CHLORIDE: 116 mmol/L — AB (ref 98–111)
CO2: 20 mmol/L — ABNORMAL LOW (ref 22–32)
CO2: 18 mmol/L — ABNORMAL LOW (ref 22–32)
Calcium: 7.9 mg/dL — ABNORMAL LOW (ref 8.9–10.3)
Calcium: 7.6 mg/dL — ABNORMAL LOW (ref 8.9–10.3)
Chloride: 113 mmol/L — ABNORMAL HIGH (ref 98–111)
Creatinine, Ser: 1.11 mg/dL — ABNORMAL HIGH (ref 0.44–1.00)
Creatinine, Ser: 0.89 mg/dL (ref 0.44–1.00)
GFR calc Af Amer: >60 mL/min (ref >60)
GFR calc Af Amer: >60 mL/min (ref >60)
GFR calc non Af Amer: >60 mL/min (ref >60)
GFR calc non Af Amer: >60 mL/min (ref >60)
GLUCOSE: 90 mg/dL (ref 70–99)
Glucose, Bld: 104 mg/dL — ABNORMAL HIGH (ref 70–99)
POTASSIUM: 4.4 mmol/L (ref 3.5–5.1)
POTASSIUM: 4 mmol/L (ref 3.5–5.1)
SODIUM: 139 mmol/L (ref 135–145)
Sodium: 138 mmol/L (ref 135–145)

## 2018-08-22 MED ORDER — SODIUM CHLORIDE 0.9 % IV SOLN
Freq: Once | INTRAVENOUS | Status: AC
  Administered 2018-08-22: 12:00:00 via INTRAVENOUS

## 2018-08-22 MED ORDER — SODIUM CHLORIDE 0.9 % IV BOLUS
1000.0000 mL | Freq: Once | INTRAVENOUS | Status: AC
  Administered 2018-08-22: 1000 mL via INTRAVENOUS

## 2018-08-22 NOTE — Progress Notes (Signed)
PROGRESS NOTE    Dominique [name]  GEX:[id number] DOB: 12/08/1980 DOA: 07/04/2018 PCP: Benito Mccreedy, MD   Brief Narrative: Dominique [name] is a 37 y.o. 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 Patient presented with vomiting/poor appetite found to have hypovolemia, wound infections and anemia.    Assessment & Plan:   Principal Problem:   Hypovolemia Active Problems:   Orthostatic hypotension   Crohn's colitis with perforation s/p left colectomy/colostomy 2016   OSA on CPAP   Chronic anemia   Multiple wounds of skin   Mild renal insufficiency   Hyponatremia   Malnutrition of moderate degree   Dehydration   Hypotension Unknown etiology. Associated tachycardia. Improved with IV fluids but still on the lower side. In setting of worsening deconditioning, poor nutrition and serum albumin. Orthostatic vitals significant for values suggesting POTS, however, in setting of significant deconditioning -Strict in/out -IV fluid bolus, followed by continues; may need to add midodrine to see if this helps keep BP up  Hypovolemic hyponatremia Resolved with IV fluids.  Hypernatremia Patient with poor oral intake which is likely cause. Resolved.  Anemia Acute blood loss from wound site and iron deficiency. Patient has received a total of 1 units PRBC. Hemoglobin stable.  Metabolic acidosis AKI Secondary to prerenal azotemia. Resolved.  Crohn colitis History of perforation s/p left colectomy/colostomy.   Sepsis Secondary to wounds. Sepsis physiology initially resolved. Now with recurrent hypovolemia/tachycardia with wound as infectious source, although completed recent antibiotic treatment  Corneal hydrops/ulcer Ophthalmology consulted. -Ophthalmology recommendations: continue  prednisone 1 drop to left eye BID and tobramycin/vancomycin every 2 hours while awake in left eye  Severe deconditioning -Discharge to SNF when medically stable  Multiple wounds Most recently, inguinal wound   DVT prophylaxis: SCDs Code Status:   Code Status: Full Code Family Communication: None Disposition Plan: Discharge to SNF when medically stable and when bed available   Consultants:   Ophthalmology  General surgery  Procedures:   None  Antimicrobials:  Bactrim (11/21>>11/27)  Tobramycin eye drops (11/24>>  Vancomycin eye drops (11/24>>   Subjective: No issues today. Eating/drinking a little more.  Objective: Vitals:   08/22/18 0500 08/22/18 0600 08/22/18 0812 08/22/18 1200  BP: 106/60 117/66 (!) 100/45   Pulse: (!) 108 (!) 104 93   Resp: 14 18 16    Temp:   98.3 F (36.8 C) 97.8 F (36.6 C)  TempSrc:   Oral Oral  SpO2: 100%     Weight: 128.4 kg     Height:        Intake/Output Summary (Last 24 hours) at 08/22/2018 1338 Last data filed at 08/22/2018 0512 Gross per 24 hour  Intake 561.7 ml  Output 800 ml  Net -238.3 ml   Filed Weights   08/20/18 0500 08/21/18 0458 08/22/18 0500  Weight: 130.2 kg 128.4 kg 128.4 kg    Examination:  General exam: Appears calm and comfortable Respiratory system: Clear to auscultation. Respiratory effort normal. Cardiovascular system: S1 & S2 heard, RRR. No murmurs, rubs, gallops or clicks. Gastrointestinal system: Abdomen is nondistended, soft and nontender. Obese. Colostomy bag with liquid stool. Normal bowel sounds heard. Central nervous system: Alert and oriented. No focal neurological deficits. Extremities: No edema. No calf tenderness Skin: No cyanosis. No rashes Psychiatry: Judgement and insight appear normal. Flat affect   Data Reviewed: I have personally reviewed  following labs and imaging studies  CBC: Recent Labs  Lab 08/20/18 1920  WBC 7.4  HGB 12.0  HCT 38.2  MCV 83.6  PLT 867   Basic  Metabolic Panel: Recent Labs  Lab 08/20/18 1920 08/21/18 1152 08/22/18 0315  NA 157* 135 138  K 5.5* 3.8 4.4  CL 130* 109 113*  CO2 14* 22 20*  GLUCOSE 96 99 104*  BUN 9 6 7   CREATININE 1.15* 0.80 1.11*  CALCIUM 8.8* 8.2* 7.9*   GFR: Estimated Creatinine Clearance: 88.3 mL/min (A) (by C-G formula based on SCr of 1.11 mg/dL (H)). Liver Function Tests: No results for input(s): AST, ALT, ALKPHOS, BILITOT, PROT, ALBUMIN in the last 168 hours. No results for input(s): LIPASE, AMYLASE in the last 168 hours. No results for input(s): AMMONIA in the last 168 hours. Coagulation Profile: No results for input(s): INR, PROTIME in the last 168 hours. Cardiac Enzymes: No results for input(s): CKTOTAL, CKMB, CKMBINDEX, TROPONINI in the last 168 hours. BNP (last 3 results) No results for input(s): PROBNP in the last 8760 hours. HbA1C: No results for input(s): HGBA1C in the last 72 hours. CBG: No results for input(s): GLUCAP in the last 168 hours. Lipid Profile: No results for input(s): CHOL, HDL, LDLCALC, TRIG, CHOLHDL, LDLDIRECT in the last 72 hours. Thyroid Function Tests: No results for input(s): TSH, T4TOTAL, FREET4, T3FREE, THYROIDAB in the last 72 hours. Anemia Panel: No results for input(s): VITAMINB12, FOLATE, FERRITIN, TIBC, IRON, RETICCTPCT in the last 72 hours. Sepsis Labs: Recent Labs  Lab 08/20/18 1517 08/20/18 1920  LATICACIDVEN 2.4* 1.8    Recent Results (from the past 240 hour(s))  Eye culture     Status: None   Collection Time: 08/16/18  1:00 PM  Result Value Ref Range Status   Specimen Description EYE  Final   Special Requests NONE  Final   Gram Stain NO WBC SEEN NO ORGANISMS SEEN   Final   Culture   Final    NO GROWTH 3 DAYS Performed at [facility], [address] [place], [place] [address]    Report Status 08/20/2018 FINAL  Final  MRSA PCR Screening     Status: None   Collection Time: 08/16/18  7:14 PM  Result Value Ref Range Status   MRSA by  PCR NEGATIVE NEGATIVE Final    Comment:        The GeneXpert MRSA Assay (FDA approved for NASAL specimens only), is one component of a comprehensive MRSA colonization surveillance program. It is not intended to diagnose MRSA infection nor to guide or monitor treatment for MRSA infections. Performed at [facility], [place] [address], [place],  [address]          Radiology Studies: No results found.      Scheduled Meds: . acetaminophen  650 mg Oral Q6H  . ferrous gluconate  324 mg Oral BID WC  . metoprolol tartrate  50 mg Oral BID  . multivitamin with minerals  1 tablet Oral Daily  . prednisoLONE acetate  1 drop Left Eye BID   Continuous Infusions: . sodium chloride Stopped (07/23/18 1100)  . Tobramycin 15 mg/mL compounded eye drops       . Vancomycin 25 mg/ml compounded eye drops       LOS: 47 days     Cordelia Poche, MD [facility] 08/22/2018, 1:38 PM  If 7PM-7AM, please contact night-coverage [email or web address]

## 2018-08-23 LAB — BASIC METABOLIC PANEL
Anion gap: 6 (ref 5–15)
BUN: 5 mg/dL — ABNORMAL LOW (ref 6–20)
CALCIUM: 8.1 mg/dL — AB (ref 8.9–10.3)
CO2: 17 mmol/L — ABNORMAL LOW (ref 22–32)
CREATININE: 0.81 mg/dL (ref 0.44–1.00)
Chloride: 116 mmol/L — ABNORMAL HIGH (ref 98–111)
GFR calc non Af Amer: >60 mL/min (ref >60)
Glucose, Bld: 87 mg/dL (ref 70–99)
Potassium: 4.6 mmol/L (ref 3.5–5.1)
Sodium: 139 mmol/L (ref 135–145)

## 2018-08-23 MED ORDER — TOBRAMYCIN SULFATE 80 MG/2ML IJ SOLN
OPHTHALMIC | Status: DC
  Administered 2018-08-23 – 2018-08-24 (×9): via OPHTHALMIC
  Filled 2018-08-23: qty 10

## 2018-08-23 MED ORDER — SODIUM CHLORIDE 0.9 % IV SOLN
INTRAVENOUS | Status: DC
  Administered 2018-08-23 – 2018-08-24 (×2): via INTRAVENOUS

## 2018-08-23 MED ORDER — SODIUM CHLORIDE 0.9 % IV BOLUS
1000.0000 mL | Freq: Once | INTRAVENOUS | Status: AC
  Administered 2018-08-23: 1000 mL via INTRAVENOUS

## 2018-08-23 MED ORDER — MIDODRINE HCL 5 MG PO TABS
2.5000 mg | ORAL_TABLET | Freq: Three times a day (TID) | ORAL | Status: DC
  Administered 2018-08-23 – 2018-08-24 (×4): 2.5 mg via ORAL
  Filled 2018-08-23 (×5): qty 1

## 2018-08-23 NOTE — Progress Notes (Signed)
Follow-up keratoconus with hydrops and ulceration  Subjective:  Patient reports no eye pain  Vision:  OD:  CF    OS:  HM  Pupils reactive OU.  No APD  OD:  No injection of conjunctiva.  Corneal scar stable.  AC formed  OS:  1-2+ injection of conj (mostly inferior).  Inferior central corneal ulcer continues to clear, but central cornea still looks extremely thin. +Seidel negative.  AC formed and without hypopyon.   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  The left eye continues to look much less inflamed (conjunctiva MUCH less injected, ulcer no longer suppurative, ulcer much less opaque, pain resolved).  However, I hesitate to decrease frequency of drops because central cornea looks so thin.    Plan: Continue current drops.  I urged patient again no to rub her eyes.

## 2018-08-23 NOTE — Progress Notes (Signed)
PROGRESS NOTE    Dominique [name]  HQI:[id number] DOB: July 11, 1981 DOA: 07/04/2018 PCP: Benito Mccreedy, MD   Brief Narrative: Dominique [name] is a 37 y.o. 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 Patient presented with vomiting/poor appetite found to have hypovolemia, wound infections and anemia.    Assessment & Plan:   Principal Problem:   Hypovolemia Active Problems:   Orthostatic hypotension   Crohn's colitis with perforation s/p left colectomy/colostomy 2016   OSA on CPAP   Chronic anemia   Multiple wounds of skin   Mild renal insufficiency   Hyponatremia   Malnutrition of moderate degree   Dehydration   Hypotension Unknown etiology. Associated tachycardia. Improved with IV fluids but still on the lower side. In setting of worsening deconditioning, poor nutrition and serum albumin. Orthostatic vitals significant for values suggesting POTS, however, in setting of significant deconditioning. Initially improved overnight, however, now significantly worsened. -NS bolus followed by continuous IV fluids -Start midodrine -Strict in/out  Hypovolemic hyponatremia Resolved with IV fluids.  Hypernatremia Patient with poor oral intake which is likely cause. Resolved.  Anemia Acute blood loss from wound site and iron deficiency. Patient has received a total of 1 units PRBC. Hemoglobin stable.  Metabolic acidosis Non-anion gap. In setting of GI losses -IV fluids -Watch stool output -Repeat BMP  AKI Secondary to prerenal azotemia. Resolved.  Crohn colitis History of perforation s/p left colectomy/colostomy.   Sepsis Secondary to wounds. Sepsis physiology initially resolved. Now with recurrent hypovolemia/tachycardia with wound as infectious source, although completed recent  antibiotic treatment  Corneal hydrops/ulcer Ophthalmology consulted. -Ophthalmology recommendations: continue prednisone 1 drop to left eye BID and tobramycin/vancomycin every 2 hours while awake in left eye  Severe deconditioning -Discharge to SNF when medically stable  Multiple wounds Most recently, inguinal wound   DVT prophylaxis: SCDs Code Status:   Code Status: Full Code Family Communication: None Disposition Plan: Discharge to SNF when medically stable and when bed available   Consultants:   Ophthalmology  General surgery  Procedures:   None  Antimicrobials:  Bactrim (11/21>>11/27)  Tobramycin eye drops (11/24>>  Vancomycin eye drops (11/24>>   Subjective: No concerns today. Was up and walking to the bathroom without difficulties  Objective: Vitals:   08/23/18 1000 08/23/18 1100 08/23/18 1200 08/23/18 1300  BP:   (!) 82/43   Pulse: 92 85 87 86  Resp: 16 13 14 15   Temp:      TempSrc:      SpO2: 100% 98% 97% 96%  Weight:      Height:        Intake/Output Summary (Last 24 hours) at 08/23/2018 1323 Last data filed at 08/23/2018 0659 Gross per 24 hour  Intake 700 ml  Output 1450 ml  Net -750 ml   Filed Weights   08/20/18 0500 08/21/18 0458 08/22/18 0500  Weight: 130.2 kg 128.4 kg 128.4 kg    Examination:  General exam: Appears calm and comfortable Respiratory system: Clear to auscultation. Respiratory effort normal. Cardiovascular system: S1 & S2 heard, RRR. No murmurs, rubs, gallops or clicks. Gastrointestinal system: Abdomen is nondistended, soft and nontender. Liquid stool in colostomy bag. No organomegaly or masses felt. Normal bowel sounds heard. Central nervous system: Alert and oriented. No focal neurological deficits. Extremities: No edema. No calf tenderness Skin: No cyanosis. No rashes Psychiatry: Judgement and insight  appear normal. Mood & affect appropriate.    Data Reviewed: I have personally reviewed following labs and  imaging studies  CBC: Recent Labs  Lab 08/20/18 1920  WBC 7.4  HGB 12.0  HCT 38.2  MCV 83.6  PLT 710   Basic Metabolic Panel: Recent Labs  Lab 08/20/18 1920 08/21/18 1152 08/22/18 0315 08/22/18 1509 08/23/18 0745  NA 157* 135 138 139 139  K 5.5* 3.8 4.4 4.0 4.6  CL 130* 109 113* 116* 116*  CO2 14* 22 20* 18* 17*  GLUCOSE 96 99 104* 90 87  BUN 9 6 7 6  5*  CREATININE 1.15* 0.80 1.11* 0.89 0.81  CALCIUM 8.8* 8.2* 7.9* 7.6* 8.1*   GFR: Estimated Creatinine Clearance: 121 mL/min (by C-G formula based on SCr of 0.81 mg/dL). Liver Function Tests: No results for input(s): AST, ALT, ALKPHOS, BILITOT, PROT, ALBUMIN in the last 168 hours. No results for input(s): LIPASE, AMYLASE in the last 168 hours. No results for input(s): AMMONIA in the last 168 hours. Coagulation Profile: No results for input(s): INR, PROTIME in the last 168 hours. Cardiac Enzymes: No results for input(s): CKTOTAL, CKMB, CKMBINDEX, TROPONINI in the last 168 hours. BNP (last 3 results) No results for input(s): PROBNP in the last 8760 hours. HbA1C: No results for input(s): HGBA1C in the last 72 hours. CBG: No results for input(s): GLUCAP in the last 168 hours. Lipid Profile: No results for input(s): CHOL, HDL, LDLCALC, TRIG, CHOLHDL, LDLDIRECT in the last 72 hours. Thyroid Function Tests: No results for input(s): TSH, T4TOTAL, FREET4, T3FREE, THYROIDAB in the last 72 hours. Anemia Panel: No results for input(s): VITAMINB12, FOLATE, FERRITIN, TIBC, IRON, RETICCTPCT in the last 72 hours. Sepsis Labs: Recent Labs  Lab 08/20/18 1517 08/20/18 1920  LATICACIDVEN 2.4* 1.8    Recent Results (from the past 240 hour(s))  Eye culture     Status: None   Collection Time: 08/16/18  1:00 PM  Result Value Ref Range Status   Specimen Description EYE  Final   Special Requests NONE  Final   Gram Stain NO WBC SEEN NO ORGANISMS SEEN   Final   Culture   Final    NO GROWTH 3 DAYS Performed at [facility], [address] [place], [place] [address]    Report Status 08/20/2018 FINAL  Final  MRSA PCR Screening     Status: None   Collection Time: 08/16/18  7:14 PM  Result Value Ref Range Status   MRSA by PCR NEGATIVE NEGATIVE Final    Comment:        The GeneXpert MRSA Assay (FDA approved for NASAL specimens only), is one component of a comprehensive MRSA colonization surveillance program. It is not intended to diagnose MRSA infection nor to guide or monitor treatment for MRSA infections. Performed at [facility], [place] [address] [place], [place] [address]          Radiology Studies: No results found.      Scheduled Meds: . acetaminophen  650 mg Oral Q6H  . ferrous gluconate  324 mg Oral BID WC  . metoprolol tartrate  50 mg Oral BID  . midodrine  2.5 mg Oral TID WC  . multivitamin with minerals  1 tablet Oral Daily  . prednisoLONE acetate  1 drop Left Eye BID   Continuous Infusions: . sodium chloride Stopped (07/23/18 1100)  . sodium chloride     Followed by  . sodium chloride    . Tobramycin 15 mg/ml (  fortified) compounded eye drops    . Vancomycin 25 mg/ml compounded eye drops       LOS: 48 days     Cordelia Poche, MD [facility] 08/23/2018, 1:23 PM  If 7PM-7AM, please contact night-coverage [email or web address]

## 2018-08-24 DIAGNOSIS — H16072 Perforated corneal ulcer, left eye: Secondary | ICD-10-CM

## 2018-08-24 MED ORDER — VANCOMYCIN HCL 10 G IV SOLR
2000.0000 mg | Freq: Once | INTRAVENOUS | Status: DC
  Administered 2018-08-24: 2000 mg via INTRAVENOUS
  Filled 2018-08-24: qty 2000

## 2018-08-24 MED ORDER — VANCOMYCIN HCL 10 G IV SOLR: 1750.0000 mg | INTRAVENOUS | Status: DC

## 2018-08-24 MED ORDER — HYDROMORPHONE HCL 1 MG/ML IJ SOLN
0.5000 mg | Freq: Once | INTRAMUSCULAR | Status: AC
  Administered 2018-08-24: 0.5 mg via INTRAVENOUS
  Filled 2018-08-24: qty 1

## 2018-08-24 MED ORDER — TOBRAMYCIN SULFATE 80 MG/2ML IJ SOLN
OPHTHALMIC | Status: DC
  Administered 2018-08-24 (×5): via OPHTHALMIC

## 2018-08-24 MED ORDER — CIPROFLOXACIN IN D5W 400 MG/200ML IV SOLN
400.0000 mg | Freq: Two times a day (BID) | INTRAVENOUS | Status: DC
  Filled 2018-08-24: qty 200

## 2018-08-24 MED ORDER — VANCOMYCIN HCL 500 MG IV SOLR
INTRAVENOUS | Status: DC
  Administered 2018-08-24 (×4): via OPHTHALMIC

## 2018-08-24 NOTE — Progress Notes (Signed)
[facility] requested facesheet. Fax number given to RN by MD [phone].  Faxed by unit Network engineer.

## 2018-08-24 NOTE — Progress Notes (Signed)
Patient picked up by [facility]. Report given to Tray, RN. Instructions given on how to administer eye drops per ophthalmologist.  Pain medicine administered prior to transport. Patient rated pain 9/10. Otherwise, patient stable for transfer.

## 2018-08-24 NOTE — Progress Notes (Signed)
Pharmacy Antibiotic Note  Dominique [name] is a 37 y.o. 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 Planning on transfer to [facility], who has requested patient be started on IV abx. Pharmacy has been consulted for vancomycin dosing.  Plan:  Vancomycin 2000 mg IV now, then 1750 mg IV q24 hr (est AUC 494 based on SCr 0.81; calculated VT = 9.5)  Measure vancomycin AUC at steady state as indicated  IV Cipro per MD, dosing appropriate  Continue abx eyedrops per Ophthalmology  Height: 5' 2"  (157.5 cm) Weight: 283 lb 1.1 oz (128.4 kg) IBW/kg (Calculated) : 50.1  Temp (24hrs), Avg:98.1 F (36.7 C), Min:97.9 F (36.6 C), Max:98.5 F (36.9 C)  Recent Labs  Lab 08/20/18 1517 08/20/18 1920 08/21/18 1152 08/22/18 0315 08/22/18 1509 08/23/18 0745  WBC  --  7.4  --   --   --   --   CREATININE  --  1.15* 0.80 1.11* 0.89 0.81  LATICACIDVEN 2.4* 1.8  --   --   --   --     Estimated Creatinine Clearance: 121 mL/min (by C-G formula based on SCr of 0.81 mg/dL).    Allergies  Allergen Reactions  . Other Shortness Of Breath and Swelling    Tree nuts  . Penicillins Other (See Comments)    Unknown childhood allergy Has patient had a PCN reaction causing immediate rash, facial/tongue/throat swelling, SOB or lightheadedness with hypotension: Unknown Has patient had a PCN reaction causing severe rash involving mucus membranes or skin necrosis: Unknown Has patient had a PCN reaction that required hospitalization: Unknown Has patient had a PCN reaction occurring within the last 10 years: Unknown If all of the above answers are "NO", then may proceed with Cephalosporin use.    Antimicrobials this admission:  10/14 vanc >> 10/15; resume 12/2 >> 10/14 CTX>>10/15 10/15 Bactrim po >> 10/21  10/28 tobramycin eye drop >> (11/5) 11/19 Polytrim q4hr x 30 days 11/19 Erythromycin ophth oint q6h x 30 days 11/21 Bactrim >> 11/27 11/22 Fluconazole >> 11/25 11/24  vanc, tobra eye gtt >>  12/2 Cipro >>   Microbiology results:  10/12 BCx x2: 1/4 bottles GPC (BCID = staph species, MR detected), CoNS --> suspects contaminant 10/13 UCx: neg FINAL 10/1 ucx:  neg FINAL  Thank you for allowing pharmacy to be a part of this patient's care.  Reuel Boom, PharmD, BCPS [phone] 08/24/2018, 4:48 PM

## 2018-08-24 NOTE — Progress Notes (Signed)
Follow-up Bilateral Keratoconus with corneal ulcer OS  Subjective:  Patient reports no eye pain. "Vision the same"  Eye meds:    Fortified vancomycin and tobramycin OS,each q 2 hours while awake, alternating on the hour  Predforte OS bid  Vision:  OD:  CF at 2 feet,  OS:  HM  OD:  Pupil round, no conjunctival injection, Inferior-central corneal scar with NV  OS:  Pupil small but irregular, 1-2 + injection of conjunctiva, AC flat, Small amount of Iris prolapse though central corneal ulcer  Impression:  Bilateral keratoconus with corneal ulcer OS that had been improving, now with corneal perforation.  This is an emergency.  Primary physician notified.  Patient advised not to rub eye.  Shield left eye at all times.  Resume fortified vancomycin and tobramycin q 1 hour alternating q 30 minutes, even when sleeping.  Will work on finding cornea specialist.  May need to transfer to a tertiary facility.  Luberta Mutter, MD [phone]

## 2018-08-24 NOTE — Plan of Care (Signed)
  Problem: Safety: Goal: Ability to remain free from injury will improve Outcome: Progressing   

## 2018-08-24 NOTE — Progress Notes (Signed)
[facility] called to arrange transport.

## 2018-08-24 NOTE — Progress Notes (Signed)
Nell Range [phone]) from [facility] called with following information:  Patient has been accepted to:  [facility] [place] [address]  Bed [phone]  [phone] Number for report  Send imaging on disc/power sharing and transfer summary   Accepting provider: Dr.  Jeanette Caprice

## 2018-08-24 NOTE — Progress Notes (Signed)
Report called to Radene Gunning, Therapist, sports at [facility].

## 2018-08-24 NOTE — Discharge Summary (Signed)
Physician Discharge Summary  Dominique [name] OYD:[id number] DOB: 12-01-1980 DOA: 07/04/2018  PCP: Benito Mccreedy, MD  Admit date: 07/04/2018 Discharge date: 08/24/2018  Admitted From: Home Disposition: [facility]  Discharge Condition: Guarded CODE STATUS: Full code Diet recommendation: NPO  Brief/Interim Summary:  Admission HPI written by Dominique Bulls, MD   Chief Complaint: Lightheaded, recent vomiting, poor appetite   HPI: Dominique [name] is a 37 y.o. 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  The patient reports that she been in her usual state of health until she developed nausea with recurrent vomiting several days ago.  This seemed to resolved, she is not having any abdominal pain, flank pain, dysuria, fevers, or chills, but she continues to have poor appetite and has become increasingly lightheaded, particularly on sitting up or standing.  She denies passing out and denies any chest pain.  She denies any significant dyspnea or cough.  Reports that her chronic wounds seem to be stable and the groin wounds may be improving some.  She does not take her iron supplements.  She has home health for wound care.  ED Course: Upon arrival to the ED, patient is found to be afebrile, saturating well on room air, tachycardic to 140, and with stable blood pressure.  EKG features a sinus tachycardia with rate 131.  Chemistry panel is notable for sodium of 132, bicarbonate 16, and creatinine 1.16, similar to priors.  CBC features a chronic microcytic anemia with hemoglobin 9.4, improved from priors.  There is a chronic thrombocytosis noted with platelets 448,000.  Lactic acid is reassuringly  normal.  Patient was given Dilaudid, Reglan, blood cultures were collected, and she was fluid resuscitated with 2 L normal saline.  Blood pressure remained stable, tachycardia has improved, and the patient will be observed for ongoing evaluation and management of lightheadedness and orthostasis with marked sinus tachycardia suspected secondary to hypovolemia after recent vomiting.   Hospital course:  Hypotension Appears to be chronic rather than infection mediated. Associated tachycardia has now resolved. Improved with IV fluids but still on the lower side. In setting of worsening deconditioning, poor nutrition and serum albumin. Orthostatic vitals significant for values suggesting POTS. Midodrine added and IV fluids running until GI output improves/slows.   Hypovolemic hyponatremia Resolved with IV fluids.  Hypernatremia Patient with poor oral intake which is likely cause. Resolved.  Anemia Acute blood loss from wound site and iron deficiency. Patient has received a total of 1 units PRBC. Hemoglobin stable.  Metabolic acidosis Non-anion gap. In setting of GI losses. Managing with IV fluids. If continues to drop, planning to added Bicarb containing IV fluids. Watch stool output for improvement.  AKI Secondary to prerenal azotemia. Resolved.  Crohn colitis History of perforation s/p left colectomy/colostomy.   Sepsis Secondary to wounds. Sepsis physiology initially resolved. No bacteremia. Now with recurrent hypovolemia/tachycardia with wound as infectious source, although completed recent antibiotic treatment  Bilateral keratoconus Corneal hydrops/ulcer Ophthalmology consulted and has been treating with prednisone, tobramycin and vancomycin eye drops. On 12/2, patient noted by ophthalmology to have corneal perforation requiring emergent management by corneal specialist. Discussed with [facility] who will thankfully accept for emergent management. Patient made NPO and started on  ciprofloxacin IV (Avelox not available) and vancomycin IV. Plan for  surgery either today or 12/3 and tentative plan for transfer back to [facility] on 08/26/18  Severe deconditioning -Discharge to SNF when medically stable  Multiple wounds Most recently, inguinal wound. Chronic /intertriginous/gluteal/posterior back wounds. Wound care recommendations: 2 times daily wound care. Wound care to areas of intertriginous dermatitis in the inframammary, abdominal, inguinal, posterior back and intragluteal skin folds: cleanse with NS, pat gently dry.  Cover wounds with size appropriate pieces of silver hydrofiber (Aquacel Ag+, Lawson # [phone]), top with antimicrobial wicking textile, Lynnette Caffey # [phone]), leaving 2-inches of textile protruding from wound to facilitate relocation of exudate. Change daily and PRN soiling with urine.    Discharge Diagnoses:  Principal Problem:   Hypovolemia Active Problems:   Orthostatic hypotension   Crohn's colitis with perforation s/p left colectomy/colostomy 2016   OSA on CPAP   Chronic anemia   Multiple wounds of skin   Mild renal insufficiency   Hyponatremia   Malnutrition of moderate degree   Dehydration    Discharge Instructions  Discharge Instructions    Increase activity slowly   Complete by:  As directed    Increase activity slowly   Complete by:  As directed      Allergies as of 08/24/2018      Reactions   Other Shortness Of Breath, Swelling   Tree nuts   Penicillins Other (See Comments)   Unknown childhood allergy Has patient had a PCN reaction causing immediate rash, facial/tongue/throat swelling, SOB or lightheadedness with hypotension: Unknown Has patient had a PCN reaction causing severe rash involving mucus membranes or skin necrosis: Unknown Has patient had a PCN reaction that required hospitalization: Unknown Has patient had a PCN reaction occurring within the last 10 years: Unknown If all of the above answers  are "NO", then may proceed with Cephalosporin use.      Medication List    STOP taking these medications   ferrous sulfate 325 (65 FE) MG tablet   Gerhardt's butt cream Crea   tiZANidine 4 MG tablet Commonly known as:  ZANAFLEX     TAKE these medications   acetaminophen 500 MG tablet Commonly known as:  TYLENOL Take 500 mg by mouth every 6 (six) hours as needed for mild pain, moderate pain or headache.   albuterol (2.5 MG/3ML) 0.083% nebulizer solution Commonly known as:  PROVENTIL Take 3 mLs (2.5 mg total) by nebulization 2 (two) times daily as needed for wheezing.   bisacodyl 5 MG EC tablet Commonly known as:  DULCOLAX Take 1 tablet (5 mg total) by mouth daily as needed for moderate constipation.   nutrition supplement (JUVEN) Pack Take 1 packet by mouth 2 (two) times daily between meals.   feeding supplement (ENSURE ENLIVE) Liqd Take 237 mLs by mouth daily.   feeding supplement (PRO-STAT SUGAR FREE 64) Liqd Take 30 mLs by mouth daily.   ferrous gluconate 324 MG tablet Commonly known as:  FERGON Take 1 tablet (324 mg total) by mouth 2 (two) times daily with a meal.   metoprolol tartrate 50 MG tablet Commonly known as:  LOPRESSOR Take 1 tablet (50 mg total) by mouth 2 (two) times daily. What changed:    medication strength  how much to take   multivitamin with minerals Tabs tablet Take 1 tablet by mouth daily.   oxyCODONE 5 MG immediate release tablet Commonly known as:  Oxy IR/ROXICODONE Take 1-2 tablets (5-10 mg total) by mouth every 4 (four) hours as needed for moderate pain (5 mg for  moderate pain and 10 mg for severe pain).   polyethylene glycol packet Commonly known as:  MIRALAX / GLYCOLAX Take 17 g by mouth daily as needed for mild constipation.   sulfamethoxazole-trimethoprim 400-80 MG tablet Commonly known as:  BACTRIM,SEPTRA Take 1 tablet by mouth 2 (two) times daily.   tobramycin 0.3 % ophthalmic solution Commonly known as:  TOBREX Place 2  drops into both eyes every 6 (six) hours.       Allergies  Allergen Reactions  . Other Shortness Of Breath and Swelling    Tree nuts  . Penicillins Other (See Comments)    Unknown childhood allergy Has patient had a PCN reaction causing immediate rash, facial/tongue/throat swelling, SOB or lightheadedness with hypotension: Unknown Has patient had a PCN reaction causing severe rash involving mucus membranes or skin necrosis: Unknown Has patient had a PCN reaction that required hospitalization: Unknown Has patient had a PCN reaction occurring within the last 10 years: Unknown If all of the above answers are "NO", then may proceed with Cephalosporin use.     Consultations:  Ophthalmology  General surgery   Procedures/Studies: No results found.   Subjective: No issues. Eating better. Getting up and sitting in the chair. No symptoms with ambulation to the bathroom.  Discharge Exam: Vitals:   08/24/18 1400 08/24/18 1600  BP: (!) 104/49 (!) 106/45  Pulse: 74 80  Resp: 16 14  Temp:    SpO2: 98% 95%   Vitals:   08/24/18 1000 08/24/18 1211 08/24/18 1400 08/24/18 1600  BP: (!) 94/48 (!) 103/51 (!) 104/49 (!) 106/45  Pulse: 75 92 74 80  Resp: 12 (!) 23 16 14   Temp:  98.5 F (36.9 C)    TempSrc:  Oral    SpO2: 100% 98% 98% 95%  Weight:      Height:        General exam: Appears calm and comfortable Respiratory system: Clear to auscultation. Respiratory effort normal. Cardiovascular system: S1 & S2 heard, RRR. No murmurs, rubs, gallops or clicks. Gastrointestinal system: Abdomen is nondistended, soft and nontender. No organomegaly or masses felt. Normal bowel sounds heard. Watery/loose stool in colostomy bag Central nervous system: Alert and oriented. No focal neurological deficits. Extremities: No edema. No calf tenderness Skin: No cyanosis. No rashes Psychiatry: Judgement and insight appear normal. Mood & affect appropriate.   The results of significant diagnostics  from this hospitalization (including imaging, microbiology, ancillary and laboratory) are listed below for reference.     Microbiology: Recent Results (from the past 240 hour(s))  Eye culture     Status: None   Collection Time: 08/16/18  1:00 PM  Result Value Ref Range Status   Specimen Description EYE  Final   Special Requests NONE  Final   Gram Stain NO WBC SEEN NO ORGANISMS SEEN   Final   Culture   Final    NO GROWTH 3 DAYS Performed at [facility], [address] [place], [place] [address]    Report Status 08/20/2018 FINAL  Final  MRSA PCR Screening     Status: None   Collection Time: 08/16/18  7:14 PM  Result Value Ref Range Status   MRSA by PCR NEGATIVE NEGATIVE Final    Comment:        The GeneXpert MRSA Assay (FDA approved for NASAL specimens only), is one component of a comprehensive MRSA colonization surveillance program. It is not intended to diagnose MRSA infection nor to guide or monitor treatment for MRSA infections. Performed  at [facility], [place] [address], [place], [place] [address]      Labs: BNP (last 3 results) No results for input(s): BNP in the last 8760 hours.   Basic Metabolic Panel: Recent Labs  Lab 08/20/18 1920 08/21/18 1152 08/22/18 0315 08/22/18 1509 08/23/18 0745  NA 157* 135 138 139 139  K 5.5* 3.8 4.4 4.0 4.6  CL 130* 109 113* 116* 116*  CO2 14* 22 20* 18* 17*  GLUCOSE 96 99 104* 90 87  BUN 9 6 7 6  5*  CREATININE 1.15* 0.80 1.11* 0.89 0.81  CALCIUM 8.8* 8.2* 7.9* 7.6* 8.1*   Liver Function Tests: No results for input(s): AST, ALT, ALKPHOS, BILITOT, PROT, ALBUMIN in the last 168 hours. No results for input(s): LIPASE, AMYLASE in the last 168 hours. No results for input(s): AMMONIA in the last 168 hours.   CBC: Recent Labs  Lab 08/20/18 1920  WBC 7.4  HGB 12.0  HCT 38.2  MCV 83.6  PLT 206   Cardiac Enzymes: No results for input(s): CKTOTAL, CKMB, CKMBINDEX, TROPONINI in the last 168  hours. BNP: Invalid input(s): POCBNP CBG: No results for input(s): GLUCAP in the last 168 hours. D-Dimer No results for input(s): DDIMER in the last 72 hours. Hgb A1c No results for input(s): HGBA1C in the last 72 hours. Lipid Profile No results for input(s): CHOL, HDL, LDLCALC, TRIG, CHOLHDL, LDLDIRECT in the last 72 hours. Thyroid function studies No results for input(s): TSH, T4TOTAL, T3FREE, THYROIDAB in the last 72 hours.  Invalid input(s): FREET3 Anemia work up No results for input(s): VITAMINB12, FOLATE, FERRITIN, TIBC, IRON, RETICCTPCT in the last 72 hours. Urinalysis    Component Value Date/Time   COLORURINE YELLOW 07/05/2018 0511   APPEARANCEUR CLEAR 07/05/2018 0511   LABSPEC 1.020 07/05/2018 0511   PHURINE 5.0 07/05/2018 0511   GLUCOSEU NEGATIVE 07/05/2018 0511   HGBUR NEGATIVE 07/05/2018 0511   BILIRUBINUR NEGATIVE 07/05/2018 0511   KETONESUR 5 (A) 07/05/2018 0511   PROTEINUR 30 (A) 07/05/2018 0511   UROBILINOGEN 0.2 07/22/2015 0405   NITRITE NEGATIVE 07/05/2018 0511   LEUKOCYTESUR NEGATIVE 07/05/2018 0511   Sepsis Labs Invalid input(s): PROCALCITONIN,  WBC,  LACTICIDVEN Microbiology Recent Results (from the past 240 hour(s))  Eye culture     Status: None   Collection Time: 08/16/18  1:00 PM  Result Value Ref Range Status   Specimen Description EYE  Final   Special Requests NONE  Final   Gram Stain NO WBC SEEN NO ORGANISMS SEEN   Final   Culture   Final    NO GROWTH 3 DAYS Performed at [facility], [address], [place], [place] [address]    Report Status 08/20/2018 FINAL  Final  MRSA PCR Screening     Status: None   Collection Time: 08/16/18  7:14 PM  Result Value Ref Range Status   MRSA by PCR NEGATIVE NEGATIVE Final    Comment:        The GeneXpert MRSA Assay (FDA approved for NASAL specimens only), is one component of a comprehensive MRSA colonization surveillance program. It is not intended to diagnose MRSA infection nor to guide  or monitor treatment for MRSA infections. Performed at [facility], [place] [address], [place], [place] [address]      Time coordinating discharge: 45 minutes  SIGNED:   Cordelia Poche, MD [facility] 08/24/2018, 4:32 PM

## 2018-08-24 NOTE — Progress Notes (Signed)
PROGRESS NOTE    Dominique [name]  QQI:[id number] DOB: 11-22-1980 DOA: 07/04/2018 PCP: Benito Mccreedy, MD   Brief Narrative: Dominique [name] is a 37 y.o. 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 Patient presented with vomiting/poor appetite found to have hypovolemia, wound infections and anemia.    Assessment & Plan:   Principal Problem:   Hypovolemia Active Problems:   Orthostatic hypotension   Crohn's colitis with perforation s/p left colectomy/colostomy 2016   OSA on CPAP   Chronic anemia   Multiple wounds of skin   Mild renal insufficiency   Hyponatremia   Malnutrition of moderate degree   Dehydration   Hypotension Unknown etiology. Associated tachycardia. Improved with IV fluids but still on the lower side. In setting of worsening deconditioning, poor nutrition and serum albumin. Orthostatic vitals significant for values suggesting POTS, however, in setting of significant deconditioning. Blood pressure better controlled but still soft -Continue IV fluids -Continue midodrine  Hypovolemic hyponatremia Resolved with IV fluids.  Hypernatremia Patient with poor oral intake which is likely cause. Resolved.  Anemia Acute blood loss from wound site and iron deficiency. Patient has received a total of 1 units PRBC. Hemoglobin stable.  Metabolic acidosis Non-anion gap. In setting of GI losses -If continues to drop, will add bicarb IV fluids -Watch stool output  AKI Secondary to prerenal azotemia. Resolved.  Crohn colitis History of perforation s/p left colectomy/colostomy.   Sepsis Secondary to wounds. Sepsis physiology initially resolved. Now with recurrent hypovolemia/tachycardia with wound as infectious source, although completed recent antibiotic treatment  Corneal  hydrops/ulcer Ophthalmology consulted. -Ophthalmology recommendations: continue prednisone 1 drop to left eye BID and tobramycin/vancomycin every 2 hours while awake in left eye  Severe deconditioning -Discharge to SNF when medically stable  Multiple wounds Most recently, inguinal wound   DVT prophylaxis: SCDs Code Status:   Code Status: Full Code Family Communication: None Disposition Plan: Discharge to SNF when medically stable and when bed available   Consultants:   Ophthalmology  General surgery  Procedures:   None  Antimicrobials:  Bactrim (11/21>>11/27)  Tobramycin eye drops (11/24>>  Vancomycin eye drops (11/24>>   Subjective: No issues. Eating better. Getting up and sitting in the chair. No symptoms with ambulation to the bathroom.  Objective: Vitals:   08/23/18 2020 08/23/18 2200 08/24/18 0000 08/24/18 0400  BP: (!) 117/57 (!) 118/55    Pulse: 94 81 81   Resp: 17 11 17    Temp:   98.1 F (36.7 C) 98.1 F (36.7 C)  TempSrc:   Oral Oral  SpO2: 99% 97% 100%   Weight:      Height:        Intake/Output Summary (Last 24 hours) at 08/24/2018 0830 Last data filed at 08/24/2018 0600 Gross per 24 hour  Intake 3264.99 ml  Output 1050 ml  Net 2214.99 ml   Filed Weights   08/20/18 0500 08/21/18 0458 08/22/18 0500  Weight: 130.2 kg 128.4 kg 128.4 kg    Examination:  General exam: Appears calm and comfortable Respiratory system: Clear to auscultation. Respiratory effort normal. Cardiovascular system: S1 & S2 heard, RRR. No murmurs, rubs, gallops or clicks. Gastrointestinal system: Abdomen is nondistended, soft and nontender. No organomegaly or masses felt. Normal bowel sounds heard. Watery/loose stool in colostomy bag Central nervous system: Alert and oriented. No focal neurological deficits. Extremities: No edema.  No calf tenderness Skin: No cyanosis. No rashes Psychiatry: Judgement and insight appear normal. Mood & affect appropriate.    Data  Reviewed: I have personally reviewed following labs and imaging studies  CBC: Recent Labs  Lab 08/20/18 1920  WBC 7.4  HGB 12.0  HCT 38.2  MCV 83.6  PLT 015   Basic Metabolic Panel: Recent Labs  Lab 08/20/18 1920 08/21/18 1152 08/22/18 0315 08/22/18 1509 08/23/18 0745  NA 157* 135 138 139 139  K 5.5* 3.8 4.4 4.0 4.6  CL 130* 109 113* 116* 116*  CO2 14* 22 20* 18* 17*  GLUCOSE 96 99 104* 90 87  BUN 9 6 7 6  5*  CREATININE 1.15* 0.80 1.11* 0.89 0.81  CALCIUM 8.8* 8.2* 7.9* 7.6* 8.1*   GFR: Estimated Creatinine Clearance: 121 mL/min (by C-G formula based on SCr of 0.81 mg/dL). Liver Function Tests: No results for input(s): AST, ALT, ALKPHOS, BILITOT, PROT, ALBUMIN in the last 168 hours. No results for input(s): LIPASE, AMYLASE in the last 168 hours. No results for input(s): AMMONIA in the last 168 hours. Coagulation Profile: No results for input(s): INR, PROTIME in the last 168 hours. Cardiac Enzymes: No results for input(s): CKTOTAL, CKMB, CKMBINDEX, TROPONINI in the last 168 hours. BNP (last 3 results) No results for input(s): PROBNP in the last 8760 hours. HbA1C: No results for input(s): HGBA1C in the last 72 hours. CBG: No results for input(s): GLUCAP in the last 168 hours. Lipid Profile: No results for input(s): CHOL, HDL, LDLCALC, TRIG, CHOLHDL, LDLDIRECT in the last 72 hours. Thyroid Function Tests: No results for input(s): TSH, T4TOTAL, FREET4, T3FREE, THYROIDAB in the last 72 hours. Anemia Panel: No results for input(s): VITAMINB12, FOLATE, FERRITIN, TIBC, IRON, RETICCTPCT in the last 72 hours. Sepsis Labs: Recent Labs  Lab 08/20/18 1517 08/20/18 1920  LATICACIDVEN 2.4* 1.8    Recent Results (from the past 240 hour(s))  Eye culture     Status: None   Collection Time: 08/16/18  1:00 PM  Result Value Ref Range Status   Specimen Description EYE  Final   Special Requests NONE  Final   Gram Stain NO WBC SEEN NO ORGANISMS SEEN   Final   Culture    Final    NO GROWTH 3 DAYS Performed at [facility], [address] [place], [place] [address]    Report Status 08/20/2018 FINAL  Final  MRSA PCR Screening     Status: None   Collection Time: 08/16/18  7:14 PM  Result Value Ref Range Status   MRSA by PCR NEGATIVE NEGATIVE Final    Comment:        The GeneXpert MRSA Assay (FDA approved for NASAL specimens only), is one component of a comprehensive MRSA colonization surveillance program. It is not intended to diagnose MRSA infection nor to guide or monitor treatment for MRSA infections. Performed at [facility], [place] [address] [place], [place] [address]          Radiology Studies: No results found.      Scheduled Meds: . acetaminophen  650 mg Oral Q6H  . ferrous gluconate  324 mg Oral BID WC  . metoprolol tartrate  50 mg Oral BID  . midodrine  2.5 mg Oral TID WC  . multivitamin with minerals  1 tablet Oral Daily  . prednisoLONE acetate  1 drop Left Eye BID   Continuous Infusions: . sodium chloride Stopped (07/23/18 1100)  . sodium chloride 75 mL/hr at 08/24/18 0535  .  Tobramycin 15 mg/ml (fortified) compounded eye drops    . Vancomycin 25 mg/ml compounded eye drops       LOS: 49 days     Cordelia Poche, MD [facility] 08/24/2018, 8:30 AM  If 7PM-7AM, please contact night-coverage [email or web address]

## 2018-08-28 ENCOUNTER — Inpatient Hospital Stay (HOSPITAL_COMMUNITY)
Admission: AD | Admit: 2018-08-28 | Discharge: 2018-09-07 | DRG: 125 | Disposition: A | Discharge status: Home Health | Payer: Medicaid Other | Source: Other Acute Inpatient Hospital | Attending: Internal Medicine | Admitting: Internal Medicine

## 2018-08-28 DIAGNOSIS — K5010 Crohn's disease of large intestine without complications: Secondary | ICD-10-CM | POA: Diagnosis present

## 2018-08-28 DIAGNOSIS — Z933 Colostomy status: Secondary | ICD-10-CM

## 2018-08-28 DIAGNOSIS — I2510 Atherosclerotic heart disease of native coronary artery without angina pectoris: Secondary | ICD-10-CM | POA: Diagnosis present

## 2018-08-28 DIAGNOSIS — D571 Sickle-cell disease without crisis: Secondary | ICD-10-CM | POA: Diagnosis present

## 2018-08-28 DIAGNOSIS — I10 Essential (primary) hypertension: Secondary | ICD-10-CM | POA: Diagnosis present

## 2018-08-28 DIAGNOSIS — Z6841 Body Mass Index (BMI) 40.0 and over, adult: Secondary | ICD-10-CM

## 2018-08-28 DIAGNOSIS — H18609 Keratoconus, unspecified, unspecified eye: Secondary | ICD-10-CM

## 2018-08-28 DIAGNOSIS — F17210 Nicotine dependence, cigarettes, uncomplicated: Secondary | ICD-10-CM | POA: Diagnosis present

## 2018-08-28 DIAGNOSIS — H18613 Keratoconus, stable, bilateral: Secondary | ICD-10-CM | POA: Diagnosis present

## 2018-08-28 DIAGNOSIS — I272 Pulmonary hypertension, unspecified: Secondary | ICD-10-CM | POA: Diagnosis present

## 2018-08-28 DIAGNOSIS — R109 Unspecified abdominal pain: Secondary | ICD-10-CM

## 2018-08-28 DIAGNOSIS — Z9049 Acquired absence of other specified parts of digestive tract: Secondary | ICD-10-CM | POA: Diagnosis not present

## 2018-08-28 DIAGNOSIS — G629 Polyneuropathy, unspecified: Secondary | ICD-10-CM | POA: Diagnosis present

## 2018-08-28 DIAGNOSIS — F419 Anxiety disorder, unspecified: Secondary | ICD-10-CM | POA: Diagnosis present

## 2018-08-28 DIAGNOSIS — K50118 Crohn's disease of large intestine with other complication: Secondary | ICD-10-CM | POA: Diagnosis present

## 2018-08-28 DIAGNOSIS — L304 Erythema intertrigo: Secondary | ICD-10-CM | POA: Diagnosis present

## 2018-08-28 DIAGNOSIS — J302 Other seasonal allergic rhinitis: Secondary | ICD-10-CM | POA: Diagnosis present

## 2018-08-28 DIAGNOSIS — D509 Iron deficiency anemia, unspecified: Secondary | ICD-10-CM | POA: Diagnosis present

## 2018-08-28 DIAGNOSIS — K760 Fatty (change of) liver, not elsewhere classified: Secondary | ICD-10-CM | POA: Diagnosis present

## 2018-08-28 DIAGNOSIS — G4730 Sleep apnea, unspecified: Secondary | ICD-10-CM | POA: Diagnosis present

## 2018-08-28 DIAGNOSIS — I959 Hypotension, unspecified: Secondary | ICD-10-CM | POA: Diagnosis present

## 2018-08-28 DIAGNOSIS — Z91018 Allergy to other foods: Secondary | ICD-10-CM

## 2018-08-28 DIAGNOSIS — Z880 Allergy status to penicillin: Secondary | ICD-10-CM

## 2018-08-28 DIAGNOSIS — G8929 Other chronic pain: Secondary | ICD-10-CM | POA: Diagnosis present

## 2018-08-28 DIAGNOSIS — B009 Herpesviral infection, unspecified: Secondary | ICD-10-CM | POA: Diagnosis present

## 2018-08-28 DIAGNOSIS — Z9989 Dependence on other enabling machines and devices: Secondary | ICD-10-CM | POA: Diagnosis not present

## 2018-08-28 DIAGNOSIS — H18603 Keratoconus, unspecified, bilateral: Secondary | ICD-10-CM | POA: Diagnosis not present

## 2018-08-28 DIAGNOSIS — R1013 Epigastric pain: Secondary | ICD-10-CM | POA: Diagnosis not present

## 2018-08-28 DIAGNOSIS — L8990 Pressure ulcer of unspecified site, unspecified stage: Secondary | ICD-10-CM | POA: Diagnosis present

## 2018-08-28 DIAGNOSIS — Z825 Family history of asthma and other chronic lower respiratory diseases: Secondary | ICD-10-CM

## 2018-08-28 DIAGNOSIS — G4733 Obstructive sleep apnea (adult) (pediatric): Secondary | ICD-10-CM | POA: Diagnosis present

## 2018-08-28 DIAGNOSIS — Z8249 Family history of ischemic heart disease and other diseases of the circulatory system: Secondary | ICD-10-CM

## 2018-08-28 DIAGNOSIS — Z79899 Other long term (current) drug therapy: Secondary | ICD-10-CM

## 2018-08-28 LAB — COMPREHENSIVE METABOLIC PANEL
ALT: 16 U/L (ref 0–44)
ANION GAP: 3 — AB (ref 5–15)
AST: 23 U/L (ref 15–41)
Albumin: 1.9 g/dL — ABNORMAL LOW (ref 3.5–5.0)
Alkaline Phosphatase: 89 U/L (ref 38–126)
BUN: 6 mg/dL (ref 6–20)
CO2: 24 mmol/L (ref 22–32)
Calcium: 8.3 mg/dL — ABNORMAL LOW (ref 8.9–10.3)
Chloride: 114 mmol/L — ABNORMAL HIGH (ref 98–111)
Creatinine, Ser: 0.84 mg/dL (ref 0.44–1.00)
GFR calc Af Amer: >60 mL/min (ref >60)
GFR calc non Af Amer: >60 mL/min (ref >60)
Glucose, Bld: 108 mg/dL — ABNORMAL HIGH (ref 70–99)
Potassium: 3.5 mmol/L (ref 3.5–5.1)
Sodium: 141 mmol/L (ref 135–145)
Total Bilirubin: 0.6 mg/dL (ref 0.3–1.2)
Total Protein: 5.7 g/dL — ABNORMAL LOW (ref 6.5–8.1)

## 2018-08-28 LAB — CBC
HCT: 32.1 % — ABNORMAL LOW (ref 36.0–46.0)
Hemoglobin: 10 g/dL — ABNORMAL LOW (ref 12.0–15.0)
MCH: 27 pg (ref 26.0–34.0)
MCHC: 31.2 g/dL (ref 30.0–36.0)
MCV: 86.8 fL (ref 80.0–100.0)
PLATELETS: 323 10*3/uL (ref 150–400)
RBC: 3.7 MIL/uL — ABNORMAL LOW (ref 3.87–5.11)
RDW: 18.3 % — ABNORMAL HIGH (ref 11.5–15.5)
WBC: 7.1 10*3/uL (ref 4.0–10.5)
nRBC: 0 % (ref 0.0–0.2)

## 2018-08-28 MED ORDER — FERROUS GLUCONATE 324 (38 FE) MG PO TABS
324.0000 mg | ORAL_TABLET | Freq: Two times a day (BID) | ORAL | Status: DC
  Administered 2018-08-29 – 2018-09-07 (×16): 324 mg via ORAL
  Filled 2018-08-28 (×23): qty 1

## 2018-08-28 MED ORDER — SODIUM CHLORIDE 0.9 % IV SOLN: 250.0000 mL | INTRAVENOUS | Status: DC | PRN

## 2018-08-28 MED ORDER — POLYMYXIN B-TRIMETHOPRIM 10000-0.1 UNIT/ML-% OP SOLN
1.0000 [drp] | Freq: Four times a day (QID) | OPHTHALMIC | Status: DC
  Administered 2018-08-29 – 2018-09-07 (×39): 1 [drp] via OPHTHALMIC
  Filled 2018-08-28: qty 10

## 2018-08-28 MED ORDER — ALBUTEROL SULFATE (2.5 MG/3ML) 0.083% IN NEBU: 2.5000 mg | INHALATION_SOLUTION | Freq: Two times a day (BID) | RESPIRATORY_TRACT | Status: DC | PRN

## 2018-08-28 MED ORDER — OXYCODONE HCL 5 MG PO TABS
10.0000 mg | ORAL_TABLET | ORAL | Status: DC | PRN
  Administered 2018-08-29 – 2018-09-04 (×8): 10 mg via ORAL
  Filled 2018-08-28 (×8): qty 2

## 2018-08-28 MED ORDER — SODIUM CHLORIDE 0.9% FLUSH: 3.0000 mL | INTRAVENOUS | Status: DC | PRN

## 2018-08-28 MED ORDER — POLYETHYLENE GLYCOL 3350 17 G PO PACK: 17.0000 g | PACK | Freq: Every day | ORAL | Status: DC | PRN

## 2018-08-28 MED ORDER — HYDROMORPHONE HCL 2 MG PO TABS
2.0000 mg | ORAL_TABLET | ORAL | Status: DC | PRN
  Administered 2018-08-29 – 2018-08-31 (×10): 2 mg via ORAL
  Filled 2018-08-28 (×12): qty 1

## 2018-08-28 MED ORDER — ACETAMINOPHEN 500 MG PO TABS
500.0000 mg | ORAL_TABLET | Freq: Four times a day (QID) | ORAL | Status: DC | PRN
  Administered 2018-08-29 (×3): 500 mg via ORAL
  Filled 2018-08-28 (×3): qty 1

## 2018-08-28 MED ORDER — BISACODYL 5 MG PO TBEC: 5.0000 mg | DELAYED_RELEASE_TABLET | Freq: Every day | ORAL | Status: DC | PRN

## 2018-08-28 MED ORDER — ENSURE ENLIVE PO LIQD
237.0000 mL | ORAL | Status: DC
  Administered 2018-08-29: 237 mL via ORAL

## 2018-08-28 MED ORDER — METOPROLOL TARTRATE 50 MG PO TABS
50.0000 mg | ORAL_TABLET | Freq: Two times a day (BID) | ORAL | Status: DC
  Administered 2018-08-29 – 2018-08-30 (×3): 50 mg via ORAL
  Filled 2018-08-28 (×4): qty 1

## 2018-08-28 MED ORDER — ADULT MULTIVITAMIN W/MINERALS CH
1.0000 | ORAL_TABLET | Freq: Every day | ORAL | Status: DC
  Administered 2018-08-29 – 2018-09-04 (×3): 1 via ORAL
  Filled 2018-08-28 (×8): qty 1

## 2018-08-28 MED ORDER — ENOXAPARIN SODIUM 40 MG/0.4ML ~~LOC~~ SOLN
40.0000 mg | SUBCUTANEOUS | Status: DC
  Administered 2018-08-29 – 2018-09-06 (×7): 40 mg via SUBCUTANEOUS
  Filled 2018-08-28 (×11): qty 0.4

## 2018-08-28 MED ORDER — SODIUM CHLORIDE 0.9% FLUSH
3.0000 mL | Freq: Two times a day (BID) | INTRAVENOUS | Status: DC
  Administered 2018-08-29 – 2018-09-07 (×16): 3 mL via INTRAVENOUS

## 2018-08-28 MED ORDER — JUVEN PO PACK
1.0000 | PACK | Freq: Two times a day (BID) | ORAL | Status: DC
  Administered 2018-09-04: 1 via ORAL
  Filled 2018-08-28 (×20): qty 1

## 2018-08-28 MED ORDER — PREDNISOLONE ACETATE 1 % OP SUSP
1.0000 [drp] | Freq: Four times a day (QID) | OPHTHALMIC | Status: DC
  Administered 2018-08-29 – 2018-09-07 (×40): 1 [drp] via OPHTHALMIC
  Filled 2018-08-28: qty 5

## 2018-08-28 NOTE — Progress Notes (Signed)
Pt tx from [facility] around 2030 via EMS, Pt alert x4 no c/o pain at this time, v/s are stable lf eye patch intact. No admission orders at this time, made Dr Hal Hope aware that the Pt is here. I will continue to monitor.

## 2018-08-29 DIAGNOSIS — H18603 Keratoconus, unspecified, bilateral: Secondary | ICD-10-CM

## 2018-08-29 DIAGNOSIS — K50118 Crohn's disease of large intestine with other complication: Secondary | ICD-10-CM

## 2018-08-29 DIAGNOSIS — L304 Erythema intertrigo: Secondary | ICD-10-CM

## 2018-08-29 DIAGNOSIS — H18609 Keratoconus, unspecified, unspecified eye: Secondary | ICD-10-CM

## 2018-08-29 MED ORDER — LIDOCAINE HCL 1 % IJ SOLN
0.50 | INTRAMUSCULAR | Status: DC
Start: ? — End: 2018-08-29

## 2018-08-29 MED ORDER — POLYMYXIN B-TRIMETHOPRIM 10000-0.1 UNIT/ML-% OP SOLN
1.00 | OPHTHALMIC | Status: DC
Start: 2018-08-28 — End: 2018-08-29

## 2018-08-29 MED ORDER — ACETAMINOPHEN 325 MG PO TABS
650.00 | ORAL_TABLET | ORAL | Status: DC
Start: 2018-08-29 — End: 2018-08-29

## 2018-08-29 MED ORDER — METOPROLOL TARTRATE 50 MG PO TABS
50.00 | ORAL_TABLET | ORAL | Status: DC
Start: 2018-08-28 — End: 2018-08-29

## 2018-08-29 MED ORDER — FERROUS SULFATE 324 (65 FE) MG PO TBEC
324.00 | DELAYED_RELEASE_TABLET | ORAL | Status: DC
Start: 2018-08-29 — End: 2018-08-29

## 2018-08-29 MED ORDER — ALBUTEROL SULFATE (5 MG/ML) 0.5% IN NEBU
2.50 | INHALATION_SOLUTION | RESPIRATORY_TRACT | Status: DC
Start: ? — End: 2018-08-29

## 2018-08-29 MED ORDER — OXYCODONE HCL 5 MG PO TABS
5.00 | ORAL_TABLET | ORAL | Status: DC
Start: ? — End: 2018-08-29

## 2018-08-29 MED ORDER — PREDNISOLONE ACETATE 1 % OP SUSP
1.00 | OPHTHALMIC | Status: DC
Start: 2018-08-28 — End: 2018-08-29

## 2018-08-29 MED ORDER — HYDROMORPHONE HCL 1 MG/ML IJ SOLN
0.25 | INTRAMUSCULAR | Status: DC
Start: ? — End: 2018-08-29

## 2018-08-29 MED ORDER — POLYETHYLENE GLYCOL 3350 17 G PO PACK
17.00 | PACK | ORAL | Status: DC
Start: 2018-08-29 — End: 2018-08-29

## 2018-08-29 NOTE — Evaluation (Signed)
Physical Therapy Evaluation Patient Details Name: Dominique [name] MRN: [id number] DOB: [birth year] Today's Date: 08/29/2018   History of Present Illness  37 y.o. 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   Clinical Impression  Pt admitted with above diagnosis. Pt currently with functional limitations due to the deficits listed below (see PT Problem List). Pt ambulated 17' with min hand held assist and use of handrail, she denied dizziness during PT session. Pt will benefit from skilled PT to increase their independence and safety with mobility to allow discharge to the venue listed below.       Follow Up Recommendations SNF(for wound care)    Equipment Recommendations  Rolling walker with 5" wheels;3in1 (PT)    Recommendations for Other Services       Precautions / Restrictions Precautions Precautions: Fall Precaution Comments: colostomy; pt denies h/o falls in past 1 year Restrictions Weight Bearing Restrictions: No      Mobility  Bed Mobility   Bed Mobility: Supine to Sit     Supine to sit: Min assist     General bed mobility comments: HOB up 25*, increased time, min A to raise trunk; able to don socks independently in supine  Transfers Overall transfer level: Needs assistance   Transfers: Sit to/from Stand Sit to Stand: Min guard Stand pivot transfers: Min guard;From elevated surface       General transfer comment: used armrest, increased time  Ambulation/Gait Ambulation/Gait assistance: Min assist Gait Distance (Feet): 80 Feet Assistive device: 1 person hand held assist Gait Pattern/deviations: Step-through pattern;Decreased stride length;Wide base of support Gait velocity: decreased    General Gait Details: slow but steady, min HHA  and occaissional use of handrail in hall, increased lateral excursion of trunk bilaterally 2* body habitus, denied dizziness during ambulation  Stairs            Wheelchair Mobility    Modified Rankin (Stroke Patients Only)       Balance Overall balance assessment: Needs assistance   Sitting balance-Leahy Scale: Good     Standing balance support: No upper extremity supported Standing balance-Leahy Scale: Fair Standing balance comment: single UE support for dynamic standing                             Pertinent Vitals/Pain Pain Score: 7  Pain Location: groin with movement Pain Descriptors / Indicators: Discomfort;Grimacing Pain Intervention(s): Limited activity within patient's tolerance;Monitored during session;Patient requesting pain meds-RN notified    Home Living Family/patient expects to be discharged to:: Skilled nursing facility Living Arrangements: Parent Available Help at Discharge: Family;Available PRN/intermittently Type of Home: Apartment Home Access: Stairs to enter Entrance Stairs-Rails: Can reach both Entrance Stairs-Number of Steps: 4 Home Layout: One level Home Equipment: None      Prior Function Level of Independence: Independent         Comments: lives with father who goes to dialysis 3x/week, and 14 y.o. daughter     Hand Dominance        Extremity/Trunk Assessment   Upper Extremity Assessment Upper Extremity Assessment: Generalized weakness    Lower Extremity Assessment Lower Extremity Assessment: Overall WFL for tasks assessed(4/5 B knee extension)    Cervical / Trunk Assessment Cervical / Trunk Assessment: Normal  Communication   Communication: No difficulties  Cognition Arousal/Alertness: Awake/alert  Behavior During Therapy: WFL for tasks assessed/performed Overall Cognitive Status: Within Functional Limits for tasks assessed                                        General Comments       Exercises     Assessment/Plan    PT Assessment Patient needs continued PT services  PT Problem List Decreased activity tolerance;Decreased balance;Decreased mobility;Decreased skin integrity       PT Treatment Interventions Gait training;DME instruction;Functional mobility training;Therapeutic activities;Therapeutic exercise;Balance training;Patient/family education    PT Goals (Current goals can be found in the Care Plan section)  Acute Rehab PT Goals Patient Stated Goal: to heal wounds, be with her 24 year old daughter, help her dad who is a dialysis pt PT Goal Formulation: With patient Time For Goal Achievement: 08/31/18 Potential to Achieve Goals: Good    Frequency Min 2X/week   Barriers to discharge        Co-evaluation               AM-PAC PT "6 Clicks" Mobility  Outcome Measure Help needed turning from your back to your side while in a flat bed without using bedrails?: A Little Help needed moving from lying on your back to sitting on the side of a flat bed without using bedrails?: A Little Help needed moving to and from a bed to a chair (including a wheelchair)?: A Little Help needed standing up from a chair using your arms (e.g., wheelchair or bedside chair)?: A Little Help needed to walk in hospital room?: A Little Help needed climbing 3-5 steps with a railing? : A Little 6 Click Score: 18    End of Session   Activity Tolerance: Patient tolerated treatment well Patient left: in chair;with call bell/phone within reach Nurse Communication: Mobility status PT Visit Diagnosis: Difficulty in walking, not elsewhere classified (R26.2)    Time: 0102-7253 PT Time Calculation (min) (ACUTE ONLY): 33 min   Charges:   PT Evaluation $PT Eval Low Complexity: 1 Low PT Treatments $Gait Training: 8-22 mins        Blondell Reveal Kistler PT 08/29/2018  Acute Rehabilitation Services Pager [phone] Office [phone]

## 2018-08-29 NOTE — Progress Notes (Signed)
Patient ID: Dominique [name], female   DOB: 02-29-1981, 37 y.o.   MRN: [id number] Patient was readmitted early this morning after being transferred from [facility].  Patient seen and examined at bedside.  Plan of care discussed with her.  This morning's H&P and her prior medical records were reviewed by myself.  Ophthalmology/Dr. Manuella Ghazi has been consulted.  Her labs and current medications are reviewed by myself.  Repeat a.m. labs.  PT evaluation.

## 2018-08-29 NOTE — Consult Note (Signed)
CC: corneal perforation s/p PKP OS  HPI: Dominique [name] is a 37 y.o. 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 Vision slightly improved. Some redness but no pain. Wearing eye shield.PMH notable for Crohn's disease s/p perfororation and left colectomy in 2016 with colostomy with chronic abdominal and bl groin wounds.   ROS: As per HPI  PMH: Past Medical History:  Diagnosis Date  . Acute acalculous cholecystitis 11/16/2014  . Anal fistula   . Anemia   . Anxiety   . Asthma   . CHF (congestive heart failure) ([place])   . Complication of anesthesia    states she had a problem staying awake after her c-section, was [facility] to [facility]  . Coronary artery disease   . Diverticulitis of colon 11/17/2014  . Headache    used to have migraines  . Hepatic steatosis 11/17/2014  . Herpes   . Hypertension   . Morbid obesity ([place]) 03/22/2009   Qualifier: Diagnosis of  By: Ronnald Ramp CMA, [place]    . Neuropathy   . OSA (obstructive sleep apnea) 05/02/2014  . Seasonal allergies 06/13/2014  . Sickle cell anemia ([place])    She states she " has the trait" (07/20/15)  . Sleep apnea    uses Bipap  . Thyromegaly 05/02/2014    PSH: Past Surgical History:  Procedure Laterality Date  . c section 2011  2011  . COLONOSCOPY WITH PROPOFOL N/A 12/27/2015   Procedure: COLONOSCOPY WITH PROPOFOL;  Surgeon: Wonda Horner, MD;  [facility] ENDOSCOPY;  Service: Endoscopy;  Laterality: N/A;  . COLOSTOMY N/A 06/11/2015   Procedure:  CREATION OF COLOSTOMY;  Surgeon: Georganna Skeans, MD;  Location: [place];  Service: General;  Laterality: N/A;  . ESOPHAGOGASTRODUODENOSCOPY N/A 07/19/2015   Procedure: ESOPHAGOGASTRODUODENOSCOPY (EGD);  Surgeon: Clarene Essex, MD;  Location: [facility] ENDOSCOPY;  Service: Endoscopy;  Laterality: N/A;  . FLEXIBLE SIGMOIDOSCOPY N/A 06/05/2015   Procedure: FLEXIBLE SIGMOIDOSCOPY;  Surgeon: Ronald Lobo, MD;  Location:  [facility] ENDOSCOPY;  Service: Endoscopy;  Laterality: N/A;  . LAPAROTOMY N/A 06/09/2015   Procedure: EXPLORATORY LAPAROTOMY, DRAINAGE OF INTRAABDOMINAL ABSCESS, PARTIAL COLON RESECTION,  APPLICATION OF WOUND VAC;  Surgeon: Georganna Skeans, MD;  Location: [place];  Service: General;  Laterality: N/A;  . LAPAROTOMY N/A 06/11/2015   Procedure: RE-EXPLORATION OF ABDOMEN;  Surgeon: Georganna Skeans, MD;  Location: [place];  Service: General;  Laterality: N/A;    Meds: No current facility-administered medications on file prior to encounter.    Current Outpatient Medications on File Prior to Encounter  Medication Sig Dispense Refill  . acetaminophen (TYLENOL) 500 MG tablet Take 500 mg by mouth every 6 (six) hours as needed for mild pain, moderate pain or headache.    . albuterol (PROVENTIL) (2.5 MG/3ML) 0.083% nebulizer solution Take 3 mLs (2.5 mg total) by nebulization 2 (two) times daily as needed for wheezing. 75 mL 12  . metoprolol tartrate (LOPRESSOR) 50 MG tablet Take 1 tablet (50 mg total) by mouth 2 (two) times daily.    Marland Kitchen oxyCODONE (OXY IR/ROXICODONE) 5 MG immediate release tablet Take 1-2 tablets (5-10 mg total) by mouth every 4 (four) hours as needed for moderate pain (5 mg for moderate pain and 10 mg for severe pain). (Patient taking differently: Take 5-10 mg by mouth every 4 (four) hours as needed for moderate pain. ) 15 tablet 0  . prednisoLONE acetate (PRED FORTE) 1 % ophthalmic  suspension Place 1 drop into the left eye every 6 (six) hours.     Marland Kitchen trimethoprim-polymyxin b (POLYTRIM) ophthalmic solution Place 1 drop into the left eye every 6 (six) hours.     . Amino Acids-Protein Hydrolys (FEEDING SUPPLEMENT, PRO-STAT SUGAR FREE 64,) LIQD Take 30 mLs by mouth daily. (Patient not taking: Reported on 08/28/2018) 900 mL 0  . bisacodyl (DULCOLAX) 5 MG EC tablet Take 1 tablet (5 mg total) by mouth daily as needed for moderate constipation. (Patient not taking: Reported on 08/28/2018) 30 tablet 0  . feeding  supplement, ENSURE ENLIVE, (ENSURE ENLIVE) LIQD Take 237 mLs by mouth daily. (Patient not taking: Reported on 08/28/2018) 237 mL 12  . ferrous gluconate (FERGON) 324 MG tablet Take 1 tablet (324 mg total) by mouth 2 (two) times daily with a meal. (Patient not taking: Reported on 08/28/2018)  3  . Multiple Vitamin (MULTIVITAMIN WITH MINERALS) TABS tablet Take 1 tablet by mouth daily. (Patient not taking: Reported on 08/28/2018)    . nutrition supplement, JUVEN, (JUVEN) PACK Take 1 packet by mouth 2 (two) times daily between meals. (Patient not taking: Reported on 08/28/2018)  0  . polyethylene glycol (MIRALAX / GLYCOLAX) packet Take 17 g by mouth daily as needed for mild constipation. (Patient not taking: Reported on 07/04/2018) 14 each 0  . sulfamethoxazole-trimethoprim (BACTRIM) 400-80 MG tablet Take 1 tablet by mouth 2 (two) times daily. (Patient not taking: Reported on 08/28/2018)    . tobramycin (TOBREX) 0.3 % ophthalmic solution Place 2 drops into both eyes every 6 (six) hours. (Patient not taking: Reported on 08/28/2018) 5 mL 0    SH: Social History   Socioeconomic History  . Marital status: Single    Spouse name: Not on file  . Number of children: Not on file  . Years of education: Not on file  . Highest education level: Not on file  Occupational History  . Not on file  Social Needs  . Financial resource strain: Not on file  . Food insecurity:    Worry: Not on file    Inability: Not on file  . Transportation needs:    Medical: Not on file    Non-medical: Not on file  Tobacco Use  . Smoking status: Current Some Day Smoker    Packs/day: 0.50    Years: 4.00    Pack years: 2.00    Types: Cigarettes    Start date: 07/24/2014    Last attempt to quit: 05/27/2015    Years since quitting: 3.2  . Smokeless tobacco: Never Used  Substance and Sexual Activity  . Alcohol use: No    Alcohol/week: 0.0 standard drinks  . Drug use: No  . Sexual activity: Not on file  Lifestyle  . Physical  activity:    Days per week: Not on file    Minutes per session: Not on file  . Stress: Not on file  Relationships  . Social connections:    Talks on phone: Not on file    Gets together: Not on file    Attends religious service: Not on file    Active member of club or organization: Not on file    Attends meetings of clubs or organizations: Not on file    Relationship status: Not on file  Other Topics Concern  . Not on file  Social History Narrative  . Not on file    FH: Family History  Problem Relation Age of Onset  . Hypertension Mother   .  Allergies Mother   . Hypertension Father   . Cancer Paternal Grandmother        lung  . Hypertension Sister   . Asthma Brother   . Diabetes Maternal Grandmother   . Hypertension Maternal Grandmother   . Hypertension Maternal Grandfather   . Emphysema Paternal Grandfather     Exam:  Van: OD: CF @ 3 ft OS: CF @ 3 FT  CVF: OD: grossly full OS: grossly full  EOM: OD: full d/v OS: full d/v  Pupils: OD: 2 mm, reactive, no obvious APD OS:2 mm, reactive, no obvious APD  IOP: by [place] deferred given POD#2 from PKP  External: OD: lashes appear clean OS: lashes appear clean, eye shield in place   Pen Light Exam: L/L: OD: WNL OS: WNL  C/S: OD: white and quiet OS: subconj heme IT  K: OD: clear except for corneal scar IN OS: clear peripheral native cornea, PKP in place w/ sutures appear intact - limited exam as immediate post-op and bedside exam only  A/C: OD: grossly deep and quiet appearing by pen light OS: grossly deep and quiet appearing by pen light  I: OD: round and regular OS: round and regular  L: OD: clear OS: clear  DFE: deferred as patient POD#2 s/p PKP  A/P:  1. Corneal Perforation s/p Penetrating Keratoplasty OS: - Continue PF 6 times/day and Polytrim 4 times/day to the left eye as per notes from [place] continue drops as above and arrange for POW#1 follow-up w/ surgeon in clinic  at [facility] as bedside exam very limited and unable to sufficiently evaluate post-operative inflammation or graft at the bedside - Keep eye shield on OS at all times or at very least while sleeping OS   Jessamyn Watterson T. Manuella Ghazi, [place]

## 2018-08-29 NOTE — H&P (Addendum)
History and Physical    Dominique [name] TFT:[id number] DOB: [birth year] DOA: 08/28/2018  PCP: Benito Mccreedy, MD  Patient coming from: [facility]   Chief Complaint: transfer s/p eye surgery  HPI: Dominique [name] is a 37 y.o. female with medical history significant for morbid obesity, extended hospitalization, chronic intertrigo complicated by bacterial infection and poor healing, crohn's disease s/p colectomy with diverting ostomy, pHTN, who presents with above.  Hospitalized since 10/12. Hypotensive on admission, initially thought to be related to skin infections on abdomen, later thought to be possibly POTS, and treated with midodrine. Found to be hypoatremic, which resolved with fluid resuscitation. Also with iron deficiency anemia, thought to be 2/2 diet and chronic loss from chronic wounds, received 1 unit PRBCs. Also found to have prerenal aki and metabolic acidosis 2/2 GI losses 2/2 diverting ostomy 2/2 crohn's disease. Diagnosed with bilateral keratoconus, ophthomology followed, on 12/2 corneal perforation noted, transferred to [place], where received keratoplasty on 12/5, and transferred back to [place] the following day.  Patient has no complaints. No significant eye pain; has a patch on that eye  Review of Systems: As per HPI otherwise 10 point review of systems negative.    Past Medical History:  Diagnosis Date  . Acute acalculous cholecystitis 11/16/2014  . Anal fistula   . Anemia   . Anxiety   . Asthma   . CHF (congestive heart failure) ([place])   . Complication of anesthesia    states she had a problem staying awake after her c-section, was [facility] from [facility] to [facility]  . Coronary artery disease   . Diverticulitis of colon 11/17/2014  . Headache    used to have migraines  . Hepatic steatosis 11/17/2014  . Herpes   . Hypertension   . Morbid obesity ([place]) 03/22/2009   Qualifier: Diagnosis of  By: Ronnald Ramp CMA, [place]    . Neuropathy   . OSA (obstructive sleep apnea)  05/02/2014  . Seasonal allergies 06/13/2014  . Sickle cell anemia ([place])    She states she " has the trait" (07/20/15)  . Sleep apnea    uses Bipap  . Thyromegaly 05/02/2014    Past Surgical History:  Procedure Laterality Date  . c section 2011  2011  . COLONOSCOPY WITH PROPOFOL N/A 12/27/2015   Procedure: COLONOSCOPY WITH PROPOFOL;  Surgeon: Wonda Horner, MD;  Location[facility] ENDOSCOPY;  Service: Endoscopy;  Laterality: N/A;  . COLOSTOMY N/A 06/11/2015   Procedure:  CREATION OF COLOSTOMY;  Surgeon: Georganna Skeans, MD;  Location: [place];  Service: General;  Laterality: N/A;  . ESOPHAGOGASTRODUODENOSCOPY N/A 07/19/2015   Procedure: ESOPHAGOGASTRODUODENOSCOPY (EGD);  Surgeon: Clarene Essex, MD;  Location: [facility] ENDOSCOPY;  Service: Endoscopy;  Laterality: N/A;  . FLEXIBLE SIGMOIDOSCOPY N/A 06/05/2015   Procedure: FLEXIBLE SIGMOIDOSCOPY;  Surgeon: Ronald Lobo, MD;  Location: [facility] ENDOSCOPY;  Service: Endoscopy;  Laterality: N/A;  . LAPAROTOMY N/A 06/09/2015   Procedure: EXPLORATORY LAPAROTOMY, DRAINAGE OF INTRAABDOMINAL ABSCESS, PARTIAL COLON RESECTION,  APPLICATION OF WOUND VAC;  Surgeon: Georganna Skeans, MD;  Location: [place];  Service: General;  Laterality: N/A;  . LAPAROTOMY N/A 06/11/2015   Procedure: RE-EXPLORATION OF ABDOMEN;  Surgeon: Georganna Skeans, MD;  Location: [place];  Service: General;  Laterality: N/A;     reports that she has been smoking cigarettes. She started smoking about 4 years ago. She has a 2.00 pack-year smoking history. She has never used smokeless tobacco. She reports that she does not drink alcohol or use drugs.  Allergies  Allergen Reactions  .  Other Shortness Of Breath and Swelling    Tree nuts  . Penicillins Other (See Comments)    Unknown childhood allergy Has patient had a PCN reaction causing immediate rash, facial/tongue/throat swelling, SOB or lightheadedness with hypotension: Unknown Has patient had a PCN reaction causing severe rash involving mucus membranes or skin  necrosis: Unknown Has patient had a PCN reaction that required hospitalization: Unknown Has patient had a PCN reaction occurring within the last 10 years: Unknown If all of the above answers are "NO", then may proceed with Cephalosporin use.     Family History  Problem Relation Age of Onset  . Hypertension Mother   . Allergies Mother   . Hypertension Father   . Cancer Paternal Grandmother        lung  . Hypertension Sister   . Asthma Brother   . Diabetes Maternal Grandmother   . Hypertension Maternal Grandmother   . Hypertension Maternal Grandfather   . Emphysema Paternal Grandfather     Prior to Admission medications   Medication Sig Start Date End Date Taking? Authorizing Provider  acetaminophen (TYLENOL) 500 MG tablet Take 500 mg by mouth every 6 (six) hours as needed for mild pain, moderate pain or headache.   Yes [provider]  albuterol (PROVENTIL) (2.5 MG/3ML) 0.083% nebulizer solution Take 3 mLs (2.5 mg total) by nebulization 2 (two) times daily as needed for wheezing. 06/03/18  Yes Irene Pap N, DO  metoprolol tartrate (LOPRESSOR) 50 MG tablet Take 1 tablet (50 mg total) by mouth 2 (two) times daily. 07/31/18  Yes Charlynne Cousins, MD  oxyCODONE (OXY IR/ROXICODONE) 5 MG immediate release tablet Take 1-2 tablets (5-10 mg total) by mouth every 4 (four) hours as needed for moderate pain (5 mg for moderate pain and 10 mg for severe pain). Patient taking differently: Take 5-10 mg by mouth every 4 (four) hours as needed for moderate pain.  07/09/18  Yes Mariel Aloe, MD  prednisoLONE acetate (PRED FORTE) 1 % ophthalmic suspension Place 1 drop into the left eye every 6 (six) hours.  08/28/18 09/07/18 Yes [provider]  trimethoprim-polymyxin b (POLYTRIM) ophthalmic solution Place 1 drop into the left eye every 6 (six) hours.  08/28/18 09/07/18 Yes [provider]  Amino Acids-Protein Hydrolys (FEEDING SUPPLEMENT, PRO-STAT SUGAR FREE 64,) LIQD Take  30 mLs by mouth daily. Patient not taking: Reported on 08/28/2018 07/09/18   Debbe Odea, MD  bisacodyl (DULCOLAX) 5 MG EC tablet Take 1 tablet (5 mg total) by mouth daily as needed for moderate constipation. Patient not taking: Reported on 08/28/2018 07/09/18   Debbe Odea, MD  feeding supplement, ENSURE ENLIVE, (ENSURE ENLIVE) LIQD Take 237 mLs by mouth daily. Patient not taking: Reported on 08/28/2018 07/10/18   Debbe Odea, MD  ferrous gluconate (FERGON) 324 MG tablet Take 1 tablet (324 mg total) by mouth 2 (two) times daily with a meal. Patient not taking: Reported on 08/28/2018 07/31/18   Charlynne Cousins, MD  Multiple Vitamin (MULTIVITAMIN WITH MINERALS) TABS tablet Take 1 tablet by mouth daily. Patient not taking: Reported on 08/28/2018 07/10/18   Debbe Odea, MD  nutrition supplement, JUVEN, (JUVEN) PACK Take 1 packet by mouth 2 (two) times daily between meals. Patient not taking: Reported on 08/28/2018 07/09/18   Debbe Odea, MD  polyethylene glycol (MIRALAX / GLYCOLAX) packet Take 17 g by mouth daily as needed for mild constipation. Patient not taking: Reported on 07/04/2018 06/03/18   Kayleen Memos, DO  sulfamethoxazole-trimethoprim (BACTRIM) 400-80  MG tablet Take 1 tablet by mouth 2 (two) times daily. Patient not taking: Reported on 08/28/2018 07/09/18   Debbe Odea, MD  tobramycin (TOBREX) 0.3 % ophthalmic solution Place 2 drops into both eyes every 6 (six) hours. Patient not taking: Reported on 08/28/2018 06/03/18   Kayleen Memos, DO    Physical Exam: Vitals:   08/28/18 2021  BP: (!) 105/52  Pulse: 77  Resp: 12  Temp: 97.7 F (36.5 C)  TempSrc: Oral  SpO2: 98%  Weight: 134.2 kg  Height: 5' 2"  (1.575 m)    Constitutional: No acute distress, morbidly obese Head: Atraumatic Eyes: abnormal cornea and pupil; patch on left eye ENM: Moist mucous membranes. Normal dentition.  Neck: Supple Respiratory: Clear to auscultation bilaterally, no wheezing/rales/rhonchi.  Normal respiratory effort. No accessory muscle use. . Cardiovascular: Regular rate and rhythm. No murmurs/rubs/gallops. Abdomen: obese, mildly distended, right sided ostomy in place, maceration and ulceration along creases in skin abdomen and back and buttock Musculoskeletal: No joint deformity upper and lower extremities. Normal ROM, no contractures. Normal muscle tone.  Skin: ulcers in skin creases on abdomen and back and buttock  Extremities: trace LE pitting edema Neurologic: Alert, moving all 4 extremities. Psychiatric: Normal insight and judgement.   Labs on Admission: I have personally reviewed following labs and imaging studies  CBC: Recent Labs  Lab 08/28/18 2219  WBC 7.1  HGB 10.0*  HCT 32.1*  MCV 86.8  PLT 983   Basic Metabolic Panel: Recent Labs  Lab 08/22/18 0315 08/22/18 1509 08/23/18 0745 08/28/18 2219  NA 138 139 139 141  K 4.4 4.0 4.6 3.5  CL 113* 116* 116* 114*  CO2 20* 18* 17* 24  GLUCOSE 104* 90 87 108*  BUN 7 6 5* 6  CREATININE 1.11* 0.89 0.81 0.84  CALCIUM 7.9* 7.6* 8.1* 8.3*   GFR: Estimated Creatinine Clearance: 120 mL/min (by C-G formula based on SCr of 0.84 mg/dL). Liver Function Tests: Recent Labs  Lab 08/28/18 2219  AST 23  ALT 16  ALKPHOS 89  BILITOT 0.6  PROT 5.7*  ALBUMIN 1.9*   No results for input(s): LIPASE, AMYLASE in the last 168 hours. No results for input(s): AMMONIA in the last 168 hours. Coagulation Profile: No results for input(s): INR, PROTIME in the last 168 hours. Cardiac Enzymes: No results for input(s): CKTOTAL, CKMB, CKMBINDEX, TROPONINI in the last 168 hours. BNP (last 3 results) No results for input(s): PROBNP in the last 8760 hours. HbA1C: No results for input(s): HGBA1C in the last 72 hours. CBG: No results for input(s): GLUCAP in the last 168 hours. Lipid Profile: No results for input(s): CHOL, HDL, LDLCALC, TRIG, CHOLHDL, LDLDIRECT in the last 72 hours. Thyroid Function Tests: No results for  input(s): TSH, T4TOTAL, FREET4, T3FREE, THYROIDAB in the last 72 hours. Anemia Panel: No results for input(s): VITAMINB12, FOLATE, FERRITIN, TIBC, IRON, RETICCTPCT in the last 72 hours. Urine analysis:    Component Value Date/Time   COLORURINE YELLOW 07/05/2018 0511   APPEARANCEUR CLEAR 07/05/2018 0511   LABSPEC 1.020 07/05/2018 0511   PHURINE 5.0 07/05/2018 0511   GLUCOSEU NEGATIVE 07/05/2018 0511   HGBUR NEGATIVE 07/05/2018 0511   BILIRUBINUR NEGATIVE 07/05/2018 0511   KETONESUR 5 (A) 07/05/2018 0511   PROTEINUR 30 (A) 07/05/2018 0511   UROBILINOGEN 0.2 07/22/2015 0405   NITRITE NEGATIVE 07/05/2018 0511   LEUKOCYTESUR NEGATIVE 07/05/2018 0511    Radiological Exams on Admission: No results found.   Assessment/Plan Active Problems:   Morbid obesity- BMI 67.5  Pressure ulcer   Crohn's colitis with perforation s/p left colectomy/colostomy 2016   OSA on CPAP   Keratoconus   Intertrigo   # Keratoconus - s/p keratoplasty at [place] yesterday - continue polytrim and prednisolone ophthalmic - ophtho consult in AM - left eye is patched  # Chronic intertrigo  # Chronic wounds - 2/2 obesity, poor healing 2/2 malnutrition. Present essentially ever major crease on abdomen, back, and buttock. Partial-thickness. No signs active bacterial infection - wound care, nutrition consult  # Hypotension - here mildly hypotensive, vitals otherwise wnl, diagnosed possible POTS most previous admission here - ctm, consider re-start of midodrine - unclear why on metoprolol, no hfref, no hx recent MI. Will need to discuss further w/ patient, consider taper/dc  # Iron deficiency anemia - now mild, H actually improved - continue iron  # chronic pain - cont oxycodone  # Morbid obesity # deconditioning  - pt and ot consults  # Chron's diase # diverting ostomy - ostomy care  # OSA # pHTN  - refuses cpap   DVT prophylaxis: lovenox Code Status: full  Family Communication: mother Roselee Culver [phone]  Disposition Plan: tbd  Consults called: none, will need to contact ophtho in AM  Admission status: med/surg    Desma Maxim MD [facility] Pager [phone]  If 7PM-7AM, please contact night-coverage [email or web address] Password [facility]  08/29/2018, 12:36 AM

## 2018-08-30 MED ORDER — METOPROLOL TARTRATE 25 MG PO TABS
25.0000 mg | ORAL_TABLET | Freq: Two times a day (BID) | ORAL | Status: DC
  Administered 2018-09-01: 25 mg via ORAL
  Filled 2018-08-30 (×3): qty 1

## 2018-08-30 MED ORDER — METHOCARBAMOL 500 MG PO TABS
500.0000 mg | ORAL_TABLET | Freq: Four times a day (QID) | ORAL | Status: DC | PRN
  Administered 2018-08-30 – 2018-08-31 (×2): 500 mg via ORAL
  Filled 2018-08-30: qty 1

## 2018-08-30 NOTE — Progress Notes (Signed)
Initial Nutrition Assessment  DOCUMENTATION CODES:   Morbid obesity  INTERVENTION:   -Continue Ensure Enlive po daily, each supplement provides 350 kcal and 20 grams of protein -Continue Juven Fruit Punch BID, each serving provides 95kcal and 2.5g of protein (amino acids glutamine and arginine)  NUTRITION DIAGNOSIS:   Increased nutrient needs related to wound healing as evidenced by estimated needs.  GOAL:   Patient will meet greater than or equal to 90% of their needs  MONITOR:   PO intake, Supplement acceptance, Labs, Weight trends, Skin, I & O's  REASON FOR ASSESSMENT:   Consult Diet education, Assessment of nutrition requirement/status  ASSESSMENT:   37 year old female with history of morbid obesity, chronic intertrigo complicated by bacterial infection and poor healing, Crohn's disease status post colectomy with diverting ostomy, pulmonary hypertension was transferred back from [facility] after eye surgery on 08/29/2018.  She had been hospitalized since 07/04/2018: On 08/24/2018, corneal perforation was noted and patient was transferred to [facility] where she received keratoplasty on 08/27/2018 and was transferred back to [place] [facility] the following day.    Patient was followed by nutrition team during a recent admission from 10/12 to 12/2. Pt tried multiple supplements during this time and refused most. MD has ordered Ensure supplements and Juven, will trial these this admission but suspect pt will continue to not accept them.  Patient has been consuming 75-100% of meals this admission. Pt was eating foods such as [employer] by the end of the previous admission.  Weights have continued to trend down.   Labs reviewed. Medications: Multivitamin with minerals daily  NUTRITION - FOCUSED PHYSICAL EXAM:  Nutrition focused physical exam shows no sign of depletion of muscle mass or body fat.  Diet Order:   Diet Order            Diet regular Room service appropriate? Yes; Fluid  consistency: Thin  Diet effective now              EDUCATION NEEDS:   No education needs have been identified at this time  Skin:  Skin Assessment: Skin Integrity Issues: Skin Integrity Issues:: Other (Comment) Other: per flowsheet, non-pressure wounds to: abdomen, upper and lower abdomen, L breast, bilateral groins, sacrum, and bilateral lower back  Last BM:  12/7  Height:   Ht Readings from Last 1 Encounters:  08/28/18 5' 2"  (1.575 m)    Weight:   Wt Readings from Last 1 Encounters:  08/28/18 134.2 kg    Ideal Body Weight:  50 kg  BMI:  Body mass index is 54.11 kg/m.  Estimated Nutritional Needs:   Kcal:  2100-2300  Protein:  80-90g  Fluid:  2.1L/day  Clayton Bibles, MS, RD, LDN [place] [facility] Pager: [phone] After Hours Pager: [phone]

## 2018-08-30 NOTE — Progress Notes (Signed)
Patient ID: Dominique [name], female   DOB: March 08, 1981, 37 y.o.   MRN: [id number]  PROGRESS NOTE    Terren Haberle  QJF:[id number] DOB: 03/25/1981 DOA: 08/28/2018 PCP: Benito Mccreedy, MD   Brief Narrative:  37 year old female with history of morbid obesity, chronic intertrigo complicated by bacterial infection and poor healing, Crohn's disease status post colectomy with diverting ostomy, pulmonary hypertension was transferred back from [facility] after eye surgery on 08/29/2018.  She had been hospitalized since 07/04/2018: Hypotensive on admission, initially thought to be related to skin infections on abdomen, later thought to be possibly POTS, and treated with migraine.  Found to be hypothermic, which resolved with fluid resuscitation.  She was supposed to be discharged to nursing home and social worker was working at but was difficult placement.  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  On 08/24/2018, corneal perforation was noted and patient was transferred to [facility] where she received keratoplasty on 08/27/2018 and was transferred back to [place] [facility] the following day.     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  Continue prednisolone and antibiotics eyedrops as per ophthalmology.  Outpatient follow-up with surgeon in clinic at [facility] in 1 to 2 weeks.  If the patient remains inpatient till then, will reconsult ophthalmology who can evaluate the patient in the hospital. -Keep eye shield as per ophthalmology recommendations  Chronic intertrigo with chronic wounds -Secondary to morbid obesity, poor healing  -Wound care consult.  Nutrition consult -No signs of active bacterial  infection  Hypotension -Patient was diagnosed with possible POTS during recent hospitalization -Blood pressure on the lower side.  Currently not on midodrine but required midodrine during previous hospitalization.  Also on metoprolol 50 mg twice a day, unclear to why patient is metoprolol.  Will decrease metoprolol to 25 mg twice a day and gradually taper it off.  Iron deficiency anemia -Continue iron supplementation.  Monitor hemoglobin, stable currently.  Chronic pain -Continue current pain management regimen.  Morbid obesity -Outpatient follow-up  Generalized deconditioning -PT/OT eval.  Social worker consult for nursing home placement.  History of Crohn's disease status post diverting colostomy -Continue ostomy care  Obstructive sleep apnea/pulmonary hypertension -Refuses CPAP    DVT prophylaxis: Lovenox Code Status: Full Family Communication: None at bedside Disposition Plan: Nursing home once bed is available  Consultants: Ophthalmology  Procedures: None  Antimicrobials: None   Subjective: Patient seen and examined at bedside.  She denies any overnight worsening fever, nausea, vomiting.  Objective: Vitals:   08/29/18 0400 08/29/18 1401 08/29/18 2045 08/30/18 0608  BP: 108/60 (!) 106/48 (!) 95/50 100/60  Pulse: 72 80 71 72  Resp: 16 18 16 14   Temp: 97.9 F (36.6 C) 98.1 F (36.7 C) 98.1 F (36.7 C) 98.4 F (36.9 C)  TempSrc: Oral Oral Oral Oral  SpO2: 97% 97% 96% 98%  Weight:      Height:        Intake/Output Summary (Last 24 hours) at 08/30/2018 1052 Last data filed at 08/29/2018 1807 Gross per 24 hour  Intake 720 ml  Output -  Net 720 ml   Filed Weights   08/28/18 2021  Weight: 134.2 kg    Examination:  General exam: Appears  calm and comfortable, poor historian.  Left eye has a shield Respiratory system: Bilateral decreased breath sounds at bases Cardiovascular system: S1 & S2 heard, Rate controlled Gastrointestinal system: Abdomen is  morbidly obese, nondistended, soft and nontender. Normal bowel sounds heard.  Colostomy bag present Extremities: No cyanosis, clubbing; trace edema   Data Reviewed: I have personally reviewed following labs and imaging studies  CBC: Recent Labs  Lab 08/28/18 2219  WBC 7.1  HGB 10.0*  HCT 32.1*  MCV 86.8  PLT 982   Basic Metabolic Panel: Recent Labs  Lab 08/28/18 2219  NA 141  K 3.5  CL 114*  CO2 24  GLUCOSE 108*  BUN 6  CREATININE 0.84  CALCIUM 8.3*   GFR: Estimated Creatinine Clearance: 120 mL/min (by C-G formula based on SCr of 0.84 mg/dL). Liver Function Tests: Recent Labs  Lab 08/28/18 2219  AST 23  ALT 16  ALKPHOS 89  BILITOT 0.6  PROT 5.7*  ALBUMIN 1.9*   No results for input(s): LIPASE, AMYLASE in the last 168 hours. No results for input(s): AMMONIA in the last 168 hours. Coagulation Profile: No results for input(s): INR, PROTIME in the last 168 hours. Cardiac Enzymes: No results for input(s): CKTOTAL, CKMB, CKMBINDEX, TROPONINI in the last 168 hours. BNP (last 3 results) No results for input(s): PROBNP in the last 8760 hours. HbA1C: No results for input(s): HGBA1C in the last 72 hours. CBG: No results for input(s): GLUCAP in the last 168 hours. Lipid Profile: No results for input(s): CHOL, HDL, LDLCALC, TRIG, CHOLHDL, LDLDIRECT in the last 72 hours. Thyroid Function Tests: No results for input(s): TSH, T4TOTAL, FREET4, T3FREE, THYROIDAB in the last 72 hours. Anemia Panel: No results for input(s): VITAMINB12, FOLATE, FERRITIN, TIBC, IRON, RETICCTPCT in the last 72 hours. Sepsis Labs: No results for input(s): PROCALCITON, LATICACIDVEN in the last 168 hours.  No results found for this or any previous visit (from the past 240 hour(s)).       Radiology Studies: No results found.      Scheduled Meds: . enoxaparin (LOVENOX) injection  40 mg Subcutaneous Q24H  . feeding supplement (ENSURE ENLIVE)  237 mL Oral Q24H  . ferrous gluconate   324 mg Oral BID WC  . metoprolol tartrate  50 mg Oral BID  . multivitamin with minerals  1 tablet Oral Daily  . nutrition supplement (JUVEN)  1 packet Oral BID BM  . prednisoLONE acetate  1 drop Left Eye Q6H  . sodium chloride flush  3 mL Intravenous Q12H  . trimethoprim-polymyxin b  1 drop Left Eye Q6H   Continuous Infusions: . sodium chloride       LOS: 2 days        Aline August, MD [facility] Pager [phone]  If 7PM-7AM, please contact night-coverage [email or web address] Password [facility] 08/30/2018, 10:52 AM

## 2018-08-31 LAB — BASIC METABOLIC PANEL
Anion gap: 5 (ref 5–15)
BUN: <5 mg/dL — ABNORMAL LOW (ref 6–20)
CO2: 20 mmol/L — ABNORMAL LOW (ref 22–32)
Calcium: 8.1 mg/dL — ABNORMAL LOW (ref 8.9–10.3)
Chloride: 115 mmol/L — ABNORMAL HIGH (ref 98–111)
Creatinine, Ser: 0.72 mg/dL (ref 0.44–1.00)
GFR calc Af Amer: >60 mL/min (ref >60)
GFR calc non Af Amer: >60 mL/min (ref >60)
Glucose, Bld: 85 mg/dL (ref 70–99)
POTASSIUM: 3.9 mmol/L (ref 3.5–5.1)
Sodium: 140 mmol/L (ref 135–145)

## 2018-08-31 LAB — CBC WITH DIFFERENTIAL/PLATELET
Abs Immature Granulocytes: 0.07 10*3/uL (ref 0.00–0.07)
BASOS ABS: 0.1 10*3/uL (ref 0.0–0.1)
Basophils Relative: 1 %
EOS PCT: 4 %
Eosinophils Absolute: 0.2 10*3/uL (ref 0.0–0.5)
HEMATOCRIT: 33.2 % — AB (ref 36.0–46.0)
HEMOGLOBIN: 10.6 g/dL — AB (ref 12.0–15.0)
Immature Granulocytes: 1 %
LYMPHS PCT: 32 %
Lymphs Abs: 2 10*3/uL (ref 0.7–4.0)
MCH: 26.8 pg (ref 26.0–34.0)
MCHC: 31.9 g/dL (ref 30.0–36.0)
MCV: 84.1 fL (ref 80.0–100.0)
Monocytes Absolute: 0.7 10*3/uL (ref 0.1–1.0)
Monocytes Relative: 12 %
Neutro Abs: 3.1 10*3/uL (ref 1.7–7.7)
Neutrophils Relative %: 50 %
Platelets: 301 10*3/uL (ref 150–400)
RBC: 3.95 MIL/uL (ref 3.87–5.11)
RDW: 17.9 % — ABNORMAL HIGH (ref 11.5–15.5)
WBC: 6.2 10*3/uL (ref 4.0–10.5)
nRBC: 0 % (ref 0.0–0.2)

## 2018-08-31 LAB — MAGNESIUM: MAGNESIUM: 1.6 mg/dL — AB (ref 1.7–2.4)

## 2018-08-31 MED ORDER — SIMETHICONE 80 MG PO CHEW
80.0000 mg | CHEWABLE_TABLET | Freq: Once | ORAL | Status: AC
  Administered 2018-08-31: 80 mg via ORAL
  Filled 2018-08-31: qty 1

## 2018-08-31 MED ORDER — FAMOTIDINE 20 MG PO TABS
20.0000 mg | ORAL_TABLET | Freq: Two times a day (BID) | ORAL | Status: DC
  Administered 2018-08-31 – 2018-09-07 (×15): 20 mg via ORAL
  Filled 2018-08-31 (×15): qty 1

## 2018-08-31 MED ORDER — MAGNESIUM SULFATE 2 GM/50ML IV SOLN
2.0000 g | Freq: Once | INTRAVENOUS | Status: AC
  Administered 2018-08-31: 2 g via INTRAVENOUS
  Filled 2018-08-31: qty 50

## 2018-08-31 MED ORDER — ALUM & MAG HYDROXIDE-SIMETH 200-200-20 MG/5ML PO SUSP: 30.0000 mL | Freq: Four times a day (QID) | ORAL | Status: DC | PRN

## 2018-08-31 NOTE — Consult Note (Addendum)
[place] Nurse wound consult note Patient well known to our department; has been seen periodically over the past several years. Reason for Consult: Numerous full thickness lesions in the intertriginous areas, specifically the skin folds of the inframammary, abdominal pannus, mid-back, bilateral inguinal and intragluteal areas. Also, full thickness midline wound. Wound type: Presentation is similar to hydradenitis suppurativa and this could be considered as a possibility given that patient has Crohn's Disease and the two are commonly seen concomitantly. Sees GI physician at [facility] (Dr. Erven Colla) for this/these problems. Patient was transferred to [facility] for keratoplasty and transferred back to [facility] due to payor source issues (community medicaid)  Pressure Injury POA: NA Measurement: Midline wound measures 12 cm x 3 cm x 0.3 cm  Numerous, linear full thickness wounds in the intertriginous areas: Note: the subpannicular wound is a continuous break in the integumentary system measuring 3cm to near 70cm x 0.2cm with red, smooth wound bed and large amounts of exudate. Wounds in the bilateral inguinal reach down to the labial skin folds and dressings become soiled when patient voids at home.  She has an indwelling urinary catheter.   The intragluteal wound measures 12cm x 3cm x 0.2cm Wound bed: As noted above.  No wounds are granulating, all have macerated periphery and large amounts of exudate. Drainage (amount, consistency, odor) Large amounts of light yellow exudate, malodorous  Periwound: macerated Dressing procedure/placement/frequency: I will provide a bariatric sleep surface with low air loss feature.  Patient uses [facility].  Topical wound care orders are for silver hydrofiber (Aquacel Ag+) to wound beds, topped with silver impregnated wicking textile (InterDry Ag+) for excess drainage removal via wicking.    [place] Nurse ostomy consult note Stoma type/location: RUQ ileostomy Stomal  assessment/size: 1 and 1/4 x 1 and 3/8 inch oval, red, moist Peristomal assessment: Not seen today, viewed through intact pouch  Treatment options for stomal/peristomal skin: skin barrier ring Output: brown/green seedy stool Ostomy pouching: 1pc.flat ostomy pouching system. Patient uses [employer] flat pouching system at home; understands that we will provide [place] pouches while here in house.  Education provided: none today. Reminded to call nursing staff when it is time to empty for assistance. Enrolled patient in [place] program: No. Patient is established with a downstream provided for ostomy supplies.

## 2018-08-31 NOTE — Progress Notes (Signed)
OT Cancellation Note  Patient Details Name: Dominique [name] MRN: [id number] DOB: 05/07/1981   Cancelled Treatment:    Reason Eval/Treat Not Completed: Other (comment) Met with pt, lying in bed with lights low. Pt shares that her stomach is extremely upset and she is very fatigued, difficulty keeping yes open. Pt requesting OT return next date. Will continue to follow as available and appropriate to initiate OT POC.   Zenovia Jarred, MSOT, OTR/L Behavioral Health OT/ Acute Relief OT PHP Office: [place] 08/31/2018, 4:14 PM

## 2018-08-31 NOTE — Progress Notes (Signed)
Patient ID: Dominique [name], female   DOB: 29-Mar-1981, 37 y.o.   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  She had been hospitalized since 07/04/2018: Hypotensive on admission, initially thought to be related to skin infections on abdomen, later thought to be possibly POTS, and treated with migraine.  Found to be hypothermic, which resolved with fluid resuscitation.  She was supposed to be discharged to nursing home and social worker was working at but was difficult placement.  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  On 08/24/2018, corneal perforation was noted and patient was transferred to [facility] where she received keratoplasty on 08/27/2018 and was transferred back to [place] [facility] the following day.     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  Continue prednisolone and antibiotics eyedrops as per ophthalmology.  Outpatient follow-up with surgeon in clinic at [facility] in 1 to 2 weeks.  If the patient remains inpatient till then, will reconsult ophthalmology who can evaluate the patient in the hospital. -Keep eye shield as per ophthalmology recommendations  Chronic intertrigo with chronic wounds -Secondary to morbid obesity, poor healing  -Wound care consult.   -No signs of active bacterial  infection  Hypotension -Patient was diagnosed with possible POTS during recent hospitalization -Blood pressure on the lower side.  Currently not on midodrine but required midodrine during previous hospitalization.  Continue to taper metoprolol.  Iron deficiency anemia -Continue iron supplementation.  Monitor hemoglobin, stable currently.  Chronic pain -Continue current pain management regimen.  Morbid obesity -Outpatient follow-up  Generalized deconditioning -PT/OT eval.  Social worker consult for nursing home placement.  History of Crohn's disease status post diverting colostomy -Continue ostomy care  Obstructive sleep apnea/pulmonary hypertension -Refuses CPAP  Hypomagnesemia Replace.  Repeat a.m. labs    DVT prophylaxis: Lovenox Code Status: Full Family Communication: None at bedside Disposition Plan: Nursing home as recommended by PT.  Social worker following, patient is a difficult placement in a nursing home.  Consultants: Ophthalmology  Procedures: None  Antimicrobials: None   Subjective: Patient seen and examined at bedside.  Complains of epigastric pain.  No overnight fever or vomiting.  No worsening chest pain or shortness of breath Objective: Vitals:   08/30/18 1345 08/30/18 2018 08/31/18 0438 08/31/18 1018  BP: (!) 96/51 (!) 98/55 (!) 100/56 (!) 98/46  Pulse: 87 80 74 88  Resp: 16 16 14    Temp: 99.5 F (37.5 C) 98.4 F (36.9 C) 98.6 F (37 C)   TempSrc: Oral Oral Oral   SpO2: 95% 98% 96%   Weight:      Height:        Intake/Output Summary (Last 24 hours) at 08/31/2018 1040 Last data filed at 08/31/2018 0300 Gross per 24 hour  Intake 840 ml  Output -  Net 840 ml   Filed Weights   08/28/18 2021  Weight: 134.2 kg    Examination:  General exam: No distress, poor historian.  Left eye has a shield Respiratory system: Bilateral decreased breath sounds at bases, no wheezing Cardiovascular system: S1 & S2 heard, Rate  controlled Gastrointestinal system: Abdomen is morbidly obese, nondistended, soft and mildly tender in the epigastric region. Normal bowel sounds heard.  Colostomy bag present Extremities: No cyanosis; trace edema   Data Reviewed: I have personally reviewed following labs and imaging studies  CBC: Recent Labs  Lab 08/28/18 2219 08/31/18 0533  WBC 7.1 6.2  NEUTROABS  --  3.1  HGB 10.0* 10.6*  HCT 32.1* 33.2*  MCV 86.8 84.1  PLT 323 588   Basic Metabolic Panel: Recent Labs  Lab 08/28/18 2219 08/31/18 0533  NA 141 140  K 3.5 3.9  CL 114* 115*  CO2 24 20*  GLUCOSE 108* 85  BUN 6 <5*  CREATININE 0.84 0.72  CALCIUM 8.3* 8.1*  MG  --  1.6*   GFR: Estimated Creatinine Clearance: 126 mL/min (by C-G formula based on SCr of 0.72 mg/dL). Liver Function Tests: Recent Labs  Lab 08/28/18 2219  AST 23  ALT 16  ALKPHOS 89  BILITOT 0.6  PROT 5.7*  ALBUMIN 1.9*   No results for input(s): LIPASE, AMYLASE in the last 168 hours. No results for input(s): AMMONIA in the last 168 hours. Coagulation Profile: No results for input(s): INR, PROTIME in the last 168 hours. Cardiac Enzymes: No results for input(s): CKTOTAL, CKMB, CKMBINDEX, TROPONINI in the last 168 hours. BNP (last 3 results) No results for input(s): PROBNP in the last 8760 hours. HbA1C: No results for input(s): HGBA1C in the last 72 hours. CBG: No results for input(s): GLUCAP in the last 168 hours. Lipid Profile: No results for input(s): CHOL, HDL, LDLCALC, TRIG, CHOLHDL, LDLDIRECT in the last 72 hours. Thyroid Function Tests: No results for input(s): TSH, T4TOTAL, FREET4, T3FREE, THYROIDAB in the last 72 hours. Anemia Panel: No results for input(s): VITAMINB12, FOLATE, FERRITIN, TIBC, IRON, RETICCTPCT in the last 72 hours. Sepsis Labs: No results for input(s): PROCALCITON, LATICACIDVEN in the last 168 hours.  No results found for this or any previous visit (from the past 240 hour(s)).       Radiology  Studies: No results found.      Scheduled Meds: . enoxaparin (LOVENOX) injection  40 mg Subcutaneous Q24H  . feeding supplement (ENSURE ENLIVE)  237 mL Oral Q24H  . ferrous gluconate  324 mg Oral BID WC  . metoprolol tartrate  25 mg Oral BID  . multivitamin with minerals  1 tablet Oral Daily  . nutrition supplement (JUVEN)  1 packet Oral BID BM  . prednisoLONE acetate  1 drop Left Eye Q6H  . sodium chloride flush  3 mL Intravenous Q12H  . trimethoprim-polymyxin b  1 drop Left Eye Q6H   Continuous Infusions: . sodium chloride    . magnesium sulfate 1 - 4 g bolus IVPB       LOS: 3 days        Aline August, MD [facility] Pager [phone]  If 7PM-7AM, please contact night-coverage [email or web address] Password [email or web address] 08/31/2018, 10:40 AM

## 2018-08-31 NOTE — Progress Notes (Signed)
CSW consulted for SNF placement. Pt returned to [facility] after transfer to [facility] for keratoplasty, prior to that had lengthy hospitalization at [facility]. CSWs had worked during that hospitalization to place in SNF for wound care however pt was unable to be placed due to having no payor source (has community Medicaid which does not cover SNF care, has applied for disability several times  and been denied due to no disability diagnosis). During that stay, CSW AD offered placement with 30 day LOG but no SNFs offered to accept. As these barriers still present, SNF placement is not option currently. Will follow if CSW needs arise.  Sharren Bridge, MSW, LCSW Clinical Social Work 08/31/2018 [phone]

## 2018-09-01 ENCOUNTER — Inpatient Hospital Stay (HOSPITAL_COMMUNITY): Payer: Medicaid Other

## 2018-09-01 DIAGNOSIS — R109 Unspecified abdominal pain: Secondary | ICD-10-CM

## 2018-09-01 MED ORDER — HYDROMORPHONE HCL 1 MG/ML IJ SOLN
1.0000 mg | Freq: Once | INTRAMUSCULAR | Status: AC
  Administered 2018-09-02: 1 mg via INTRAVENOUS
  Filled 2018-09-01: qty 1

## 2018-09-01 MED ORDER — HYDROMORPHONE HCL 1 MG/ML IJ SOLN
1.0000 mg | Freq: Once | INTRAMUSCULAR | Status: AC
  Administered 2018-09-01: 1 mg via INTRAVENOUS
  Filled 2018-09-01: qty 1

## 2018-09-01 MED ORDER — POLYETHYLENE GLYCOL 3350 17 G PO PACK
17.0000 g | PACK | Freq: Every day | ORAL | Status: DC
  Administered 2018-09-03: 17 g via ORAL
  Filled 2018-09-01 (×4): qty 1

## 2018-09-01 MED ORDER — SENNOSIDES-DOCUSATE SODIUM 8.6-50 MG PO TABS
1.0000 | ORAL_TABLET | Freq: Two times a day (BID) | ORAL | Status: DC
  Administered 2018-09-01 – 2018-09-04 (×6): 1 via ORAL
  Filled 2018-09-01 (×9): qty 1

## 2018-09-01 MED ORDER — METOPROLOL TARTRATE 25 MG PO TABS
12.5000 mg | ORAL_TABLET | Freq: Two times a day (BID) | ORAL | Status: DC
Start: 2018-09-01 — End: 2018-09-03
  Administered 2018-09-01 – 2018-09-02 (×3): 12.5 mg via ORAL
  Filled 2018-09-01 (×3): qty 1

## 2018-09-01 MED ORDER — HYDROMORPHONE HCL 2 MG PO TABS
2.0000 mg | ORAL_TABLET | Freq: Four times a day (QID) | ORAL | Status: DC | PRN
  Administered 2018-09-01 – 2018-09-06 (×9): 2 mg via ORAL
  Filled 2018-09-01 (×10): qty 1

## 2018-09-01 NOTE — Progress Notes (Signed)
Patient ID: Dominique [name], female   DOB: 01-12-1981, 37 y.o.   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  She had been hospitalized since 07/04/2018: Hypotensive on admission, initially thought to be related to skin infections on abdomen, later thought to be possibly POTS, and treated with migraine.  Found to be hypothermic, which resolved with fluid resuscitation.  She was supposed to be discharged to nursing home and social worker was working at but was difficult placement.  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  On 08/24/2018, corneal perforation was noted and patient was transferred to [facility] where she received keratoplasty on 08/27/2018 and was transferred back to [place] [facility] the following day.     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  Continue prednisolone and antibiotics eyedrops as per ophthalmology.  Outpatient follow-up with surgeon in clinic at [facility] in 1 to 2 weeks.  If the patient remains inpatient till then, will reconsult ophthalmology who can evaluate the patient in the hospital. -Keep eye shield as per ophthalmology recommendations  Abdominal pain -Questionable cause.  Complains of mostly epigastric pain.  Pepcid and antacid were added on 08/31/2018.  Continues to have pain.  Abdomen is soft.   Will get right upper quadrant ultrasound and if that is negative, consider CT of the abdomen  Chronic intertrigo with chronic wounds -Secondary to morbid obesity, poor healing  -Follow wound care consult recommendations -No signs of active bacterial infection  Hypotension -Patient was diagnosed with possible POTS during recent hospitalization -Blood pressure on the lower side.  Currently not on midodrine but required midodrine during previous hospitalization.  Continue to taper metoprolol.  If blood pressure remains on the lower side after stopping metoprolol, consider restarting midodrine.  Iron deficiency anemia -Continue iron supplementation.  Monitor hemoglobin, stable currently.  Chronic pain -Continue current pain management regimen.  Morbid obesity -Outpatient follow-up  Generalized deconditioning -PT recommends nursing home placement.  Social worker consult for nursing home placement.  History of Crohn's disease status post diverting colostomy -Continue ostomy care.  Outpatient follow-up with GI  Obstructive sleep apnea/pulmonary hypertension -Refuses CPAP  Hypomagnesemia -Repeat a.m. labs    DVT prophylaxis: Lovenox Code Status: Full Family Communication: None at bedside Disposition Plan: Nursing home as recommended by PT.  Social worker following, patient is a difficult placement in a nursing home.  Consultants: Ophthalmology  Procedures: None  Antimicrobials: None   Subjective: Patient seen and examined at bedside.  Still complains of epigastric pain and decreased appetite.  Patient is emptying her ostomy bag herself.  No overnight vomiting.  No chest pains. Objective: Vitals:   08/31/18 1018 08/31/18 1422 08/31/18 2209 09/01/18 0613  BP: (!) 98/46 (!) 105/50 (!) 105/54 (!) 117/54  Pulse: 88 88 82 82  Resp:  18 16 16   Temp:  98.3 F (36.8 C) 98.4 F (36.9 C) 98.1 F (36.7 C)  TempSrc:  Oral Oral Oral  SpO2:  95% 98% 99%  Weight:      Height:         Intake/Output Summary (Last 24 hours) at 09/01/2018 1140 Last data filed at 09/01/2018 0900 Gross per 24 hour  Intake 1083 ml  Output -  Net 1083 ml   Filed Weights   08/28/18 2021  Weight: 134.2 kg    Examination:  General exam: Mild distress secondary to abdominal pain.  Poor historian.  Left eye has a shield Respiratory system: Bilateral decreased breath sounds at bases, scattered crackles Cardiovascular system: S1 & S2 heard, Rate controlled Gastrointestinal system: Abdomen is morbidly obese, nondistended, soft and mildly tender in the epigastric region. Normal bowel sounds heard.  Colostomy bag present Extremities: No cyanosis; trace edema   Data Reviewed: I have personally reviewed following labs and imaging studies  CBC: Recent Labs  Lab 08/28/18 2219 08/31/18 0533  WBC 7.1 6.2  NEUTROABS  --  3.1  HGB 10.0* 10.6*  HCT 32.1* 33.2*  MCV 86.8 84.1  PLT 323 970   Basic Metabolic Panel: Recent Labs  Lab 08/28/18 2219 08/31/18 0533  NA 141 140  K 3.5 3.9  CL 114* 115*  CO2 24 20*  GLUCOSE 108* 85  BUN 6 <5*  CREATININE 0.84 0.72  CALCIUM 8.3* 8.1*  MG  --  1.6*   GFR: Estimated Creatinine Clearance: 126 mL/min (by C-G formula based on SCr of 0.72 mg/dL). Liver Function Tests: Recent Labs  Lab 08/28/18 2219  AST 23  ALT 16  ALKPHOS 89  BILITOT 0.6  PROT 5.7*  ALBUMIN 1.9*   No results for input(s): LIPASE, AMYLASE in the last 168 hours. No results for input(s): AMMONIA in the last 168 hours. Coagulation Profile: No results for input(s): INR, PROTIME in the last 168 hours. Cardiac Enzymes: No results for input(s): CKTOTAL, CKMB, CKMBINDEX, TROPONINI in the last 168 hours. BNP (last 3 results) No results for input(s): PROBNP in the last 8760 hours. HbA1C: No results for input(s): HGBA1C in the last 72 hours. CBG: No results for input(s): GLUCAP in the last 168 hours. Lipid Profile: No results for input(s): CHOL, HDL, LDLCALC, TRIG,  CHOLHDL, LDLDIRECT in the last 72 hours. Thyroid Function Tests: No results for input(s): TSH, T4TOTAL, FREET4, T3FREE, THYROIDAB in the last 72 hours. Anemia Panel: No results for input(s): VITAMINB12, FOLATE, FERRITIN, TIBC, IRON, RETICCTPCT in the last 72 hours. Sepsis Labs: No results for input(s): PROCALCITON, LATICACIDVEN in the last 168 hours.  No results found for this or any previous visit (from the past 240 hour(s)).       Radiology Studies: No results found.      Scheduled Meds: . enoxaparin (LOVENOX) injection  40 mg Subcutaneous Q24H  . famotidine  20 mg Oral BID  . feeding supplement (ENSURE ENLIVE)  237 mL Oral Q24H  . ferrous gluconate  324 mg Oral BID WC  . metoprolol tartrate  25 mg Oral BID  . multivitamin with minerals  1 tablet Oral Daily  . nutrition supplement (JUVEN)  1 packet Oral BID BM  . prednisoLONE acetate  1 drop Left Eye Q6H  . sodium chloride flush  3 mL Intravenous Q12H  . trimethoprim-polymyxin b  1 drop Left Eye Q6H   Continuous Infusions: . sodium chloride       LOS: 4 days        Aline August, MD [facility] Pager [phone]  If 7PM-7AM, please contact night-coverage [email or web address] Password [email or web address]  09/01/2018, 11:40 AM

## 2018-09-01 NOTE — Care Management (Signed)
This CM met with pt and CSW at bedside. It was explained to pt that finding a SNF bed was very unlikely due to her payer. Pt states that her sister can help her with her wound care at home if home health can come. This CM offered choice for home health and [facility] chosen. [facility] rep alerted of referral. Will need MD order for [facility] [place]. Marney Doctor RN,BSN [phone]

## 2018-09-02 ENCOUNTER — Inpatient Hospital Stay (HOSPITAL_COMMUNITY): Payer: Medicaid Other

## 2018-09-02 LAB — COMPREHENSIVE METABOLIC PANEL
ALBUMIN: 1.9 g/dL — AB (ref 3.5–5.0)
ALT: 11 U/L (ref 0–44)
AST: 13 U/L — ABNORMAL LOW (ref 15–41)
Alkaline Phosphatase: 79 U/L (ref 38–126)
Anion gap: 6 (ref 5–15)
BUN: <5 mg/dL — ABNORMAL LOW (ref 6–20)
CO2: 22 mmol/L (ref 22–32)
Calcium: 8 mg/dL — ABNORMAL LOW (ref 8.9–10.3)
Chloride: 112 mmol/L — ABNORMAL HIGH (ref 98–111)
Creatinine, Ser: 0.61 mg/dL (ref 0.44–1.00)
GFR calc Af Amer: >60 mL/min (ref >60)
GFR calc non Af Amer: >60 mL/min (ref >60)
Glucose, Bld: 96 mg/dL (ref 70–99)
Potassium: 3.3 mmol/L — ABNORMAL LOW (ref 3.5–5.1)
Sodium: 140 mmol/L (ref 135–145)
Total Bilirubin: 0.4 mg/dL (ref 0.3–1.2)
Total Protein: 5 g/dL — ABNORMAL LOW (ref 6.5–8.1)

## 2018-09-02 LAB — CBC WITH DIFFERENTIAL/PLATELET
Abs Immature Granulocytes: 0.02 10*3/uL (ref 0.00–0.07)
Basophils Absolute: 0 10*3/uL (ref 0.0–0.1)
Basophils Relative: 1 %
Eosinophils Absolute: 0.2 10*3/uL (ref 0.0–0.5)
Eosinophils Relative: 4 %
HCT: 31.6 % — ABNORMAL LOW (ref 36.0–46.0)
Hemoglobin: 9.9 g/dL — ABNORMAL LOW (ref 12.0–15.0)
Immature Granulocytes: 0 %
Lymphocytes Relative: 23 %
Lymphs Abs: 1.4 10*3/uL (ref 0.7–4.0)
MCH: 27.4 pg (ref 26.0–34.0)
MCHC: 31.3 g/dL (ref 30.0–36.0)
MCV: 87.5 fL (ref 80.0–100.0)
Monocytes Absolute: 0.9 10*3/uL (ref 0.1–1.0)
Monocytes Relative: 15 %
NRBC: 0 % (ref 0.0–0.2)
Neutro Abs: 3.4 10*3/uL (ref 1.7–7.7)
Neutrophils Relative %: 57 %
PLATELETS: 253 10*3/uL (ref 150–400)
RBC: 3.61 MIL/uL — ABNORMAL LOW (ref 3.87–5.11)
RDW: 18.1 % — ABNORMAL HIGH (ref 11.5–15.5)
WBC: 6 10*3/uL (ref 4.0–10.5)

## 2018-09-02 LAB — LIPASE, BLOOD: Lipase: 22 U/L (ref 11–51)

## 2018-09-02 LAB — MAGNESIUM: Magnesium: 1.8 mg/dL (ref 1.7–2.4)

## 2018-09-02 MED ORDER — POTASSIUM CHLORIDE CRYS ER 20 MEQ PO TBCR
40.0000 meq | EXTENDED_RELEASE_TABLET | Freq: Once | ORAL | Status: AC
  Administered 2018-09-02: 40 meq via ORAL
  Filled 2018-09-02: qty 2

## 2018-09-02 NOTE — Evaluation (Signed)
Occupational Therapy Evaluation Patient Details Name: Dominique [name] MRN: [id number] DOB: 06/01/1981 Today's Date: 09/02/2018    History of Present Illness 37 y.o. 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    Clinical Impression   Pt was admitted for the above. She is returning to [facility] after sx at [facility] and plans home after hospital stay. Will follow in acute setting with mod I level goals for basic ADLs and supervision for tub transfer.     Follow Up Recommendations  Supervision/Assistance - 24 hour    Equipment Recommendations  3 in 1 bedside commode;Hospital bed(split rails, electric with overlay air mattress). Will further assess tub DME   Recommendations for Other Services       Precautions / Restrictions Precautions Precautions: Fall Precaution Comments: colostomy; pt denies h/o falls in past 1 year.  Cornea surgery:  eye with clear patch Restrictions Weight Bearing Restrictions: No      Mobility Bed Mobility Overal bed mobility: Modified Independent Bed Mobility: Sit to Supine     Supine to sit: Supervision Sit to supine: Supervision   General bed mobility comments: increased time and use of rail  Transfers Overall transfer level: Needs assistance Equipment used: None Transfers: Sit to/from Stand Sit to Stand: Supervision         General transfer comment: slow transition    Balance Overall balance assessment: Needs assistance   Sitting balance-Leahy Scale: Good     Standing balance support: No upper extremity supported Standing balance-Leahy Scale: Fair                             ADL either performed or assessed with clinical judgement   ADL                                         General ADL  Comments: pt has AE at home and is able to perform adls at supervision level. She dons socks from bed level in hospital.  She gets to bathroom with supervision also.  Extra time for most activities     Vision Patient Visual Report: (improved since sx)       Perception     Praxis      Pertinent Vitals/Pain Pain Assessment: Faces Faces Pain Scale: Hurts little more Pain Location: stomachache Pain Descriptors / Indicators: Aching Pain Intervention(s): Limited activity within patient's tolerance;Monitored during session;Repositioned     Hand Dominance     Extremity/Trunk Assessment Upper Extremity Assessment Upper Extremity Assessment: Generalized weakness           Communication Communication Communication: No difficulties   Cognition Arousal/Alertness: Awake/alert Behavior During Therapy: WFL for tasks assessed/performed Overall Cognitive Status: Within Functional Limits for tasks assessed                                 General Comments: very slow moving    General Comments       Exercises     Shoulder Instructions      Home Living Family/patient expects to be discharged to:: Private residence Living Arrangements: Parent Available Help at Discharge: Family;Available PRN/intermittently  Bathroom Shower/Tub: Teacher, early years/pre: Standard     Home Equipment: None   Additional Comments: lives with father who is on dialysis and cannot assist her. Has 78 y o daughter, whom her mother will take care of initially.  Brother and sister live in same apt complex      Prior Functioning/Environment Level of Independence: Independent        Comments: lives with father who goes to dialysis 3x/week, and 26 y.o. daughter        OT Problem List: Decreased strength;Decreased activity tolerance;Pain;Impaired balance (sitting and/or standing);Decreased knowledge of use of DME or AE      OT Treatment/Interventions:  Self-care/ADL training;DME and/or AE instruction;Patient/family education;Balance training;Therapeutic activities    OT Goals(Current goals can be found in the care plan section) Acute Rehab OT Goals Patient Stated Goal: to heal wounds, be with her 35 year old daughter, help her dad who is a dialysis pt ADL Goals Pt Will Perform Tub/Shower Transfer: Tub transfer;with supervision;ambulating Additional ADL Goal #1: pt will gather clothes at mod I level  OT Frequency: Min 2X/week   Barriers to D/C:            Co-evaluation              AM-PAC OT "6 Clicks" Daily Activity     Outcome Measure Help from another person eating meals?: None Help from another person taking care of personal grooming?: A Little Help from another person toileting, which includes using toliet, bedpan, or urinal?: A Little Help from another person bathing (including washing, rinsing, drying)?: A Little Help from another person to put on and taking off regular upper body clothing?: A Little Help from another person to put on and taking off regular lower body clothing?: A Little 6 Click Score: 19   End of Session    Activity Tolerance: Patient tolerated treatment well Patient left: in bed;with call bell/phone within reach  OT Visit Diagnosis: Unsteadiness on feet (R26.81);Muscle weakness (generalized) (M62.81)                Time: 7290-2111 OT Time Calculation (min): 28 min Charges:  OT General Charges $OT Visit: 1 Visit OT Evaluation $OT Eval Low Complexity: 1 Low OT Treatments $Self Care/Home Management : 8-22 mins  [name], OTR/L Acute Rehabilitation Services [phone] [facility] pager [phone] office 09/02/2018  Dominique [name] 09/02/2018, 4:33 PM

## 2018-09-02 NOTE — Progress Notes (Signed)
Patient ID: Dominique [name], female   DOB: 1980-12-15, 37 y.o.   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  She had been hospitalized since 07/04/2018: Hypotensive on admission, initially thought to be related to skin infections on abdomen, later thought to be possibly POTS, and treated with migraine.  Found to be hypothermic, which resolved with fluid resuscitation.  She was supposed to be discharged to nursing home and social worker was working at but was difficult placement.  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  On 08/24/2018, corneal perforation was noted and patient was transferred to [facility] where she received keratoplasty on 08/27/2018 and was transferred back to [facility] the following day.     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  Continue prednisolone and antibiotics eyedrops as per ophthalmology.  Outpatient follow-up with surgeon in clinic at [facility] in 1 to 2 weeks.  If the patient remains inpatient till then, will reconsult ophthalmology who can evaluate the patient in the hospital. -Keep eye shield as per ophthalmology recommendations  Abdominal pain -Questionable cause.  Complains of mostly epigastric pain.  Pepcid and antacid were added on 08/31/2018.  Continues to have pain but slightly  improved.  Abdomen is soft.  Right upper quadrant ultrasound was negative for stone or biliary dilatation.  Patient does not feel ready for discharge today.  Will get x-ray of the abdomen to make sure there is no bowel obstruction.  If pain continues, might have to get CT of the abdomen.  Chronic intertrigo with chronic wounds -Secondary to morbid obesity, poor healing  -Follow wound care consult recommendations -No signs of active bacterial infection  Hypotension -Patient was diagnosed with possible POTS during recent hospitalization -Blood pressure on the lower side.  Currently not on midodrine but required midodrine during previous hospitalization.  Continue to taper metoprolol.  If blood pressure remains on the lower side after stopping metoprolol, consider restarting midodrine.  Iron deficiency anemia -Continue iron supplementation.  Monitor hemoglobin, stable currently.  Chronic pain -Continue current pain management regimen.  Morbid obesity -Outpatient follow-up  Generalized deconditioning -PT recommends nursing home placement.  As per Education officer, museum and care management, finding a SNF bed would be very unlikely for her.  Probable discharge to home with home health in the next few days  History of Crohn's disease status post diverting colostomy -Continue ostomy care.  Outpatient follow-up with GI  Obstructive sleep apnea/pulmonary hypertension -Refuses CPAP  Hypomagnesemia -Improved.    DVT prophylaxis: Lovenox Code Status: Full Family Communication: None at bedside Disposition Plan: Home with home health in the next 1 or 2 days Consultants: Ophthalmology  Procedures: None  Antimicrobials: Antibiotic eyedrops   Subjective: Patient seen and examined at bedside.  She complains of epigastric pain and decreased appetite but slightly better compared to yesterday.  No overnight fever or vomiting.  Does not feel that she is ready  for discharge today.  No cough or worsening  shortness of breath.   Objective: Vitals:   09/01/18 0613 09/01/18 1226 09/01/18 2223 09/02/18 0619  BP: (!) 117/54 107/75 (!) 107/49 (!) 109/50  Pulse: 82 86 82 75  Resp: 16 18 18 18   Temp: 98.1 F (36.7 C) 98.5 F (36.9 C) 98.2 F (36.8 C) 98.4 F (36.9 C)  TempSrc: Oral Oral Oral Oral  SpO2: 99% 100% 96% 100%  Weight:      Height:        Intake/Output Summary (Last 24 hours) at 09/02/2018 0844 Last data filed at 09/01/2018 1226 Gross per 24 hour  Intake 360 ml  Output -  Net 360 ml   Filed Weights   08/28/18 2021  Weight: 134.2 kg    Examination:  General exam: No acute distress.  Poor historian.  Left eye has a shield Respiratory system: Bilateral decreased breath sounds at bases, scattered crackles.  No wheezing Cardiovascular system: Rate controlled, S1-S2 heard Gastrointestinal system: Abdomen is morbidly obese, nondistended, soft and mildly tender in the epigastric region. Normal bowel sounds heard.  Colostomy bag present Extremities: No cyanosis; trace edema   Data Reviewed: I have personally reviewed following labs and imaging studies  CBC: Recent Labs  Lab 08/28/18 2219 08/31/18 0533 09/02/18 0329  WBC 7.1 6.2 6.0  NEUTROABS  --  3.1 3.4  HGB 10.0* 10.6* 9.9*  HCT 32.1* 33.2* 31.6*  MCV 86.8 84.1 87.5  PLT 323 301 168   Basic Metabolic Panel: Recent Labs  Lab 08/28/18 2219 08/31/18 0533 09/02/18 0329  NA 141 140 140  K 3.5 3.9 3.3*  CL 114* 115* 112*  CO2 24 20* 22  GLUCOSE 108* 85 96  BUN 6 <5* <5*  CREATININE 0.84 0.72 0.61  CALCIUM 8.3* 8.1* 8.0*  MG  --  1.6* 1.8   GFR: Estimated Creatinine Clearance: 126 mL/min (by C-G formula based on SCr of 0.61 mg/dL). Liver Function Tests: Recent Labs  Lab 08/28/18 2219 09/02/18 0329  AST 23 13*  ALT 16 11  ALKPHOS 89 79  BILITOT 0.6 0.4  PROT 5.7* 5.0*  ALBUMIN 1.9* 1.9*   Recent Labs  Lab 09/02/18 0329  LIPASE 22   No results for input(s): AMMONIA in the last 168  hours. Coagulation Profile: No results for input(s): INR, PROTIME in the last 168 hours. Cardiac Enzymes: No results for input(s): CKTOTAL, CKMB, CKMBINDEX, TROPONINI in the last 168 hours. BNP (last 3 results) No results for input(s): PROBNP in the last 8760 hours. HbA1C: No results for input(s): HGBA1C in the last 72 hours. CBG: No results for input(s): GLUCAP in the last 168 hours. Lipid Profile: No results for input(s): CHOL, HDL, LDLCALC, TRIG, CHOLHDL, LDLDIRECT in the last 72 hours. Thyroid Function Tests: No results for input(s): TSH, T4TOTAL, FREET4, T3FREE, THYROIDAB in the last 72 hours. Anemia Panel: No results for input(s): VITAMINB12, FOLATE, FERRITIN, TIBC, IRON, RETICCTPCT in the last 72 hours. Sepsis Labs: No results for input(s): PROCALCITON, LATICACIDVEN in the last 168 hours.  No results found for this or any previous visit (from the past 240 hour(s)).       Radiology Studies: US Abdomen Limited  Result Date: 09/01/2018 CLINICAL DATA:  Abdominal pain EXAM: ULTRASOUND ABDOMEN LIMITED RIGHT UPPER QUADRANT COMPARISON:  CT 05/30/2018, ultrasound 04/25/2015 FINDINGS: Gallbladder: Possible tumefactive sludge or small polyp in the gallbladder. No shadowing stone. Normal wall thickness. Negative sonographic Murphy. Common bile duct: Diameter: 3.2 mm Liver: Increased hepatic echogenicity  without focal abnormality. Portal vein is patent on color Doppler imaging with normal direction of blood flow towards the liver. IMPRESSION: 1. Limited exam secondary to habitus and patient inability to position for the exam 2. Small echogenic focus in the gallbladder, probably represents polyp or small amount of tumefactive sludge. Negative for shadowing stone or biliary dilatation 3. Echogenic liver suggesting steatosis Electronically Signed   By: Donavan Foil M.D.   On: 09/01/2018 19:37        Scheduled Meds: . enoxaparin (LOVENOX) injection  40 mg Subcutaneous Q24H  . famotidine   20 mg Oral BID  . feeding supplement (ENSURE ENLIVE)  237 mL Oral Q24H  . ferrous gluconate  324 mg Oral BID WC  . metoprolol tartrate  12.5 mg Oral BID  . multivitamin with minerals  1 tablet Oral Daily  . nutrition supplement (JUVEN)  1 packet Oral BID BM  . polyethylene glycol  17 g Oral Daily  . potassium chloride  40 mEq Oral Once  . prednisoLONE acetate  1 drop Left Eye Q6H  . senna-docusate  1 tablet Oral BID  . sodium chloride flush  3 mL Intravenous Q12H  . trimethoprim-polymyxin b  1 drop Left Eye Q6H   Continuous Infusions: . sodium chloride       LOS: 5 days        Aline August, MD [facility] Pager [phone]  If 7PM-7AM, please contact night-coverage [email or web address] Password [facility] 09/02/2018, 8:44 AM

## 2018-09-02 NOTE — Progress Notes (Signed)
PT Cancellation Note  Patient Details Name: Dominique [name] MRN: [id number] DOB: 15-Aug-1981   Cancelled Treatment:    Reason Eval/Treat Not Completed: Other (comment) Pt in the bathroom and requested PT to return later.  Will check back as schedule permits.   Galen Manila 09/02/2018, 11:11 AM

## 2018-09-02 NOTE — Progress Notes (Signed)
Physical Therapy Treatment Patient Details Name: Dominique [name] MRN: [id number] DOB: [birth year] Today's Date: 09/02/2018    History of Present Illness 37 y.o. 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     PT Comments    Pt ambulated in hallway with RW with slow cadence.  She had some nausea at end of session.  Plan is now for her to go home with her sister coming and checking in on her.  She lives with her father and 17 year old daughter.  She states they can call for help if she needed assistance.  Recommend HHPT, RW, and BSC.   Follow Up Recommendations  Home health PT     Equipment Recommendations  Rolling walker with 5" wheels;3in1 (PT)    Recommendations for Other Services       Precautions / Restrictions Precautions Precautions: Fall Precaution Comments: colostomy; pt denies h/o falls in past 1 year    Mobility  Bed Mobility Overal bed mobility: Modified Independent Bed Mobility: Sit to Supine           General bed mobility comments: Increased time and she kind of throws her leg up on the bed using momentum.    Transfers Overall transfer level: Needs assistance Equipment used: None Transfers: Sit to/from Stand Sit to Stand: Supervision         General transfer comment: slow transition  Ambulation/Gait Ambulation/Gait assistance: Min guard Gait Distance (Feet): 160 Feet Assistive device: Rolling walker (2 wheeled) Gait Pattern/deviations: Step-through pattern;Decreased stride length;Wide base of support Gait velocity: decreased    General Gait Details: Amb in hallway with RW.  In room, without device, she tends to reach out for furniture   Stairs             Wheelchair Mobility    Modified Rankin (Stroke Patients  Only)       Balance Overall balance assessment: Needs assistance   Sitting balance-Leahy Scale: Good     Standing balance support: No upper extremity supported Standing balance-Leahy Scale: Fair                              Cognition Arousal/Alertness: Awake/alert Behavior During Therapy: WFL for tasks assessed/performed Overall Cognitive Status: Within Functional Limits for tasks assessed                                 General Comments: very slow moving       Exercises      General Comments        Pertinent Vitals/Pain Pain Assessment: Faces Faces Pain Scale: Hurts even more Pain Location: stomachache Pain Descriptors / Indicators: Aching Pain Intervention(s): Monitored during session;Repositioned    Home Living                      Prior Function            PT Goals (current goals can now be found in the care plan section) Acute Rehab PT Goals PT Goal Formulation: With patient Time For Goal Achievement: 09/16/18 Potential to Achieve Goals: Good Progress towards PT goals: Progressing toward goals    Frequency           PT Plan Discharge plan  needs to be updated    Co-evaluation              AM-PAC PT "6 Clicks" Mobility   Outcome Measure  Help needed turning from your back to your side while in a flat bed without using bedrails?: A Little Help needed moving from lying on your back to sitting on the side of a flat bed without using bedrails?: A Little Help needed moving to and from a bed to a chair (including a wheelchair)?: A Little Help needed standing up from a chair using your arms (e.g., wheelchair or bedside chair)?: A Little Help needed to walk in hospital room?: A Little Help needed climbing 3-5 steps with a railing? : A Little 6 Click Score: 18    End of Session   Activity Tolerance: Patient tolerated treatment well Patient left: in bed;with call bell/phone within reach Nurse Communication:  Mobility status PT Visit Diagnosis: Difficulty in walking, not elsewhere classified (R26.2)     Time: 9784-7841 PT Time Calculation (min) (ACUTE ONLY): 21 min  Charges:  $Gait Training: 8-22 mins                     Patti Shorb L. Tamala Julian, [place] Pager [phone] 09/02/2018    Dominique [name] 09/02/2018, 2:01 PM

## 2018-09-03 DIAGNOSIS — R1013 Epigastric pain: Secondary | ICD-10-CM

## 2018-09-03 DIAGNOSIS — G4733 Obstructive sleep apnea (adult) (pediatric): Secondary | ICD-10-CM

## 2018-09-03 DIAGNOSIS — Z9989 Dependence on other enabling machines and devices: Secondary | ICD-10-CM

## 2018-09-03 LAB — CBC WITH DIFFERENTIAL/PLATELET
Abs Immature Granulocytes: 0.01 10*3/uL (ref 0.00–0.07)
BASOS PCT: 1 %
Basophils Absolute: 0 10*3/uL (ref 0.0–0.1)
Eosinophils Absolute: 0.2 10*3/uL (ref 0.0–0.5)
Eosinophils Relative: 4 %
HCT: 32.8 % — ABNORMAL LOW (ref 36.0–46.0)
HEMOGLOBIN: 10.1 g/dL — AB (ref 12.0–15.0)
Immature Granulocytes: 0 %
LYMPHS ABS: 1.5 10*3/uL (ref 0.7–4.0)
Lymphocytes Relative: 29 %
MCH: 27.1 pg (ref 26.0–34.0)
MCHC: 30.8 g/dL (ref 30.0–36.0)
MCV: 87.9 fL (ref 80.0–100.0)
Monocytes Absolute: 0.9 10*3/uL (ref 0.1–1.0)
Monocytes Relative: 16 %
Neutro Abs: 2.7 10*3/uL (ref 1.7–7.7)
Neutrophils Relative %: 50 %
Platelets: 218 10*3/uL (ref 150–400)
RBC: 3.73 MIL/uL — ABNORMAL LOW (ref 3.87–5.11)
RDW: 18.1 % — ABNORMAL HIGH (ref 11.5–15.5)
WBC: 5.4 10*3/uL (ref 4.0–10.5)
nRBC: 0 % (ref 0.0–0.2)

## 2018-09-03 LAB — BASIC METABOLIC PANEL
Anion gap: 5 (ref 5–15)
BUN: <5 mg/dL — ABNORMAL LOW (ref 6–20)
CO2: 23 mmol/L (ref 22–32)
Calcium: 8 mg/dL — ABNORMAL LOW (ref 8.9–10.3)
Chloride: 111 mmol/L (ref 98–111)
Creatinine, Ser: 0.64 mg/dL (ref 0.44–1.00)
GFR calc non Af Amer: >60 mL/min (ref >60)
Glucose, Bld: 74 mg/dL (ref 70–99)
Potassium: 3.8 mmol/L (ref 3.5–5.1)
Sodium: 139 mmol/L (ref 135–145)

## 2018-09-03 LAB — MAGNESIUM: Magnesium: 1.7 mg/dL (ref 1.7–2.4)

## 2018-09-03 MED ORDER — HYDROMORPHONE HCL 1 MG/ML IJ SOLN
1.0000 mg | Freq: Once | INTRAMUSCULAR | Status: AC
  Administered 2018-09-03: 1 mg via INTRAVENOUS
  Filled 2018-09-03: qty 1

## 2018-09-03 MED ORDER — MAGNESIUM SULFATE 4 GM/100ML IV SOLN
4.0000 g | Freq: Once | INTRAVENOUS | Status: AC
  Administered 2018-09-03: 4 g via INTRAVENOUS
  Filled 2018-09-03: qty 100

## 2018-09-03 NOTE — Progress Notes (Signed)
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  She had been hospitalized since 07/04/2018: Hypotensive on admission, initially thought to be related to skin infections on abdomen, later thought to be possibly POTS, and treated with migraine.  Found to be hypothermic, which resolved with fluid resuscitation.  She was supposed to be discharged to nursing home and social worker was working at but was difficult placement.  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  On 08/24/2018, corneal perforation was noted and patient was transferred to [facility] where she received keratoplasty on 08/27/2018 and was transferred back to [facility] the following day.     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  Outpatient follow-up with surgeon in clinic at [facility] in 1 to 2 weeks.  If the patient remains inpatient till then, will reconsult ophthalmology who can evaluate the patient in the hospital. -Keep eye shield as per ophthalmology recommendations  Abdominal pain -Questionable cause.    Abdominal pain improved in the epigastric and upper abdominal region.  Patient states has been having bowel movements.  Abdominal films done were negative for any bowel obstruction or any acute abnormalities. Pepcid and  antacid were added on 08/31/2018.    Abdominal exam unremarkable.  Continue supportive care. If pain continues, might have to get CT of the abdomen.  Chronic intertrigo with chronic wounds -Secondary to morbid obesity, poor healing  -Follow wound care consult recommendations -No signs of active bacterial infection  Hypotension -Patient was diagnosed with possible POTS during recent hospitalization -Blood pressure borderline with systolic in the 37C.  Patient currently asymptomatic.  Patient was on midodrine during previous hospitalization however currently not on midodrine.  Will discontinue Lopressor and follow blood pressure.  If patient becomes hypotensive will place on midodrine.  Follow.  Iron deficiency anemia -Hemoglobin stable at 10.1.  Continue oral iron supplementation.  Follow H&H.   Chronic pain -Continue current pain regimen.   Morbid obesity -Outpatient follow-up  Generalized deconditioning -PT recommends nursing home placement.  As per Education officer, museum and care management, finding a SNF bed would be very unlikely for her.  Patient likely to be discharged home with home health services.  History of Crohn's disease status post diverting colostomy -Continue ostomy care.  Outpatient follow-up with GI  Obstructive sleep apnea/pulmonary hypertension -Refuses CPAP  Hypomagnesemia -Magnesium at 1.7.  We will give magnesium 4 g IV x1.  Repeat magnesium levels in the morning.     DVT prophylaxis: Lovenox Code Status: Full Family Communication: Updated patient.  No family at bedside. Disposition Plan: Home with home health hopefully the next 24 to 48 hours.   Consultants:   Ophthalmology: Dr. Manuella Ghazi 08/29/2018  Procedures:   None  Antimicrobials:   Antibiotic eyedrops   Subjective: Patient in bed.  States upper abdominal epigastric pain improved.  Complaining of some pain around the groin  area.  Had bowel movement yesterday.  No nausea or  emesis.  Objective: Vitals:   09/02/18 1428 09/02/18 2205 09/03/18 0451 09/03/18 1317  BP: (!) 119/58 (!) 104/50 95/63 (!) 99/52  Pulse: 83 81 79 80  Resp: 20 17 18 16   Temp: 97.7 F (36.5 C) 97.8 F (36.6 C) 97.6 F (36.4 C) 98.3 F (36.8 C)  TempSrc: Oral Oral Oral Oral  SpO2: 98% 100% 97% 100%  Weight:      Height:       No intake or output data in the 24 hours ending 09/03/18 1820 Filed Weights   08/28/18 2021  Weight: 134.2 kg    Examination:  General exam: Appears calm and comfortable.  Left eye shield on. Respiratory system: Clear to auscultation. Respiratory effort normal. Cardiovascular system: S1 & S2 heard, RRR. No JVD, murmurs, rubs, gallops or clicks. No pedal edema. Gastrointestinal system: Abdomen is nondistended, soft and nontender. No organomegaly or masses felt. Normal bowel sounds heard. Central nervous system: Alert and oriented. No focal neurological deficits. Extremities: Symmetric 5 x 5 power. Skin: No rashes, lesions or ulcers Psychiatry: Judgement and insight appear normal. Mood & affect appropriate.     Data Reviewed: I have personally reviewed following labs and imaging studies  CBC: Recent Labs  Lab 08/28/18 2219 08/31/18 0533 09/02/18 0329 09/03/18 0358  WBC 7.1 6.2 6.0 5.4  NEUTROABS  --  3.1 3.4 2.7  HGB 10.0* 10.6* 9.9* 10.1*  HCT 32.1* 33.2* 31.6* 32.8*  MCV 86.8 84.1 87.5 87.9  PLT 323 301 253 782   Basic Metabolic Panel: Recent Labs  Lab 08/28/18 2219 08/31/18 0533 09/02/18 0329 09/03/18 0358  NA 141 140 140 139  K 3.5 3.9 3.3* 3.8  CL 114* 115* 112* 111  CO2 24 20* 22 23  GLUCOSE 108* 85 96 74  BUN 6 <5* <5* <5*  CREATININE 0.84 0.72 0.61 0.64  CALCIUM 8.3* 8.1* 8.0* 8.0*  MG  --  1.6* 1.8 1.7   GFR: Estimated Creatinine Clearance: 126 mL/min (by C-G formula based on SCr of 0.64 mg/dL). Liver Function Tests: Recent Labs  Lab 08/28/18 2219 09/02/18 0329  AST 23 13*  ALT 16 11  ALKPHOS 89 79  BILITOT 0.6  0.4  PROT 5.7* 5.0*  ALBUMIN 1.9* 1.9*   Recent Labs  Lab 09/02/18 0329  LIPASE 22   No results for input(s): AMMONIA in the last 168 hours. Coagulation Profile: No results for input(s): INR, PROTIME in the last 168 hours. Cardiac Enzymes: No results for input(s): CKTOTAL, CKMB, CKMBINDEX, TROPONINI in the last 168 hours. BNP (last 3 results) No results for input(s): PROBNP in the last 8760 hours. HbA1C: No results for input(s): HGBA1C in the last 72 hours. CBG: No results for input(s): GLUCAP in the last 168 hours. Lipid Profile: No results for input(s): CHOL, HDL, LDLCALC, TRIG, CHOLHDL, LDLDIRECT in the last 72 hours. Thyroid Function Tests: No results for input(s): TSH, T4TOTAL, FREET4, T3FREE, THYROIDAB in the last 72 hours. Anemia Panel: No results for input(s): VITAMINB12, FOLATE, FERRITIN, TIBC, IRON, RETICCTPCT in the last 72 hours. Sepsis Labs: No results for input(s): PROCALCITON, LATICACIDVEN in the last 168 hours.  No results found for this or any previous visit (from the past 240 hour(s)).       Radiology Studies: Dg Abd 2 Views  Result Date: 09/02/2018 CLINICAL DATA:  Abdominal pain.  History of Crohn's disease. EXAM: ABDOMEN - 2 VIEW COMPARISON:  CT abdomen and pelvis May 30, 2018 FINDINGS: Supine and upright images obtained. There is no appreciable bowel dilatation or air-fluid level to suggest bowel obstruction. No free air. Fairly mild degree of stool in colon. Lung bases clear. There is a small apparent phlebolith in the right abdomen. IMPRESSION: No evident bowel obstruction or free air.  Lung bases clear. Electronically Signed   By: Lowella Grip III M.D.   On: 09/02/2018 13:26        Scheduled Meds: . enoxaparin (LOVENOX) injection  40 mg Subcutaneous Q24H  . famotidine  20 mg Oral BID  . feeding supplement (ENSURE ENLIVE)  237 mL Oral Q24H  . ferrous gluconate  324 mg Oral BID WC  . multivitamin with minerals  1 tablet Oral Daily  .  nutrition supplement (JUVEN)  1 packet Oral BID BM  . polyethylene glycol  17 g Oral Daily  . prednisoLONE acetate  1 drop Left Eye Q6H  . senna-docusate  1 tablet Oral BID  . sodium chloride flush  3 mL Intravenous Q12H  . trimethoprim-polymyxin b  1 drop Left Eye Q6H   Continuous Infusions: . sodium chloride       LOS: 6 days    Time spent: 35 minutes    Irine Seal, MD [facility] Pager [phone]  If 7PM-7AM, please contact night-coverage [email or web address] Password [email or web address] 09/03/2018, 6:20 PM

## 2018-09-04 LAB — HEMOGLOBIN AND HEMATOCRIT, BLOOD
HEMATOCRIT: 31.9 % — AB (ref 36.0–46.0)
HEMOGLOBIN: 10.5 g/dL — AB (ref 12.0–15.0)

## 2018-09-04 LAB — BASIC METABOLIC PANEL
Anion gap: 9 (ref 5–15)
BUN: <5 mg/dL — ABNORMAL LOW (ref 6–20)
CO2: 20 mmol/L — ABNORMAL LOW (ref 22–32)
Calcium: 7.9 mg/dL — ABNORMAL LOW (ref 8.9–10.3)
Chloride: 110 mmol/L (ref 98–111)
Creatinine, Ser: 0.79 mg/dL (ref 0.44–1.00)
GFR calc Af Amer: >60 mL/min (ref >60)
GFR calc non Af Amer: >60 mL/min (ref >60)
Glucose, Bld: 97 mg/dL (ref 70–99)
Potassium: 4.7 mmol/L (ref 3.5–5.1)
SODIUM: 139 mmol/L (ref 135–145)

## 2018-09-04 LAB — MAGNESIUM: Magnesium: 2.1 mg/dL (ref 1.7–2.4)

## 2018-09-04 MED ORDER — PANTOPRAZOLE SODIUM 40 MG PO TBEC
40.0000 mg | DELAYED_RELEASE_TABLET | Freq: Two times a day (BID) | ORAL | Status: DC
  Administered 2018-09-04 – 2018-09-07 (×7): 40 mg via ORAL
  Filled 2018-09-04 (×7): qty 1

## 2018-09-04 MED ORDER — OXYCODONE HCL 5 MG PO TABS
15.0000 mg | ORAL_TABLET | ORAL | Status: DC | PRN
  Administered 2018-09-04 – 2018-09-07 (×9): 15 mg via ORAL
  Filled 2018-09-04 (×9): qty 3

## 2018-09-04 MED ORDER — HYDROMORPHONE HCL 1 MG/ML IJ SOLN
1.0000 mg | Freq: Once | INTRAMUSCULAR | Status: AC
  Administered 2018-09-04: 1 mg via INTRAVENOUS
  Filled 2018-09-04: qty 1

## 2018-09-04 MED ORDER — METOPROLOL TARTRATE 25 MG PO TABS
12.5000 mg | ORAL_TABLET | Freq: Two times a day (BID) | ORAL | Status: DC
  Administered 2018-09-04 – 2018-09-07 (×6): 12.5 mg via ORAL
  Filled 2018-09-04 (×6): qty 1

## 2018-09-04 NOTE — Progress Notes (Signed)
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  She had been hospitalized since 07/04/2018: Hypotensive on admission, initially thought to be related to skin infections on abdomen, later thought to be possibly POTS, and treated with migraine.  Found to be hypothermic, which resolved with fluid resuscitation.  She was supposed to be discharged to nursing home and social worker was working at but was difficult placement.  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  On 08/24/2018, corneal perforation was noted and patient was transferred to [facility] where she received keratoplasty on 08/27/2018 and was transferred back to [place] [facility] the following day.     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  Outpatient follow-up with surgeon in clinic at [facility] in 1 to 2 weeks.  If the patient remains inpatient till then, will reconsult ophthalmology who can evaluate the patient in the hospital. -Keep eye shield as per ophthalmology recommendations  Abdominal pain -Questionable cause.    Abdominal pain improved in the epigastric and upper abdominal region.  Patient states has been having bowel movements.  Abdominal films done were negative for any bowel obstruction or any acute abnormalities. Pepcid and  antacid were added on 08/31/2018.    Abdominal exam unremarkable.  Continue supportive care.  Chronic intertrigo with chronic wounds -Secondary to morbid obesity, poor healing  -Follow wound care consult recommendations -No signs of active bacterial infection  Hypotension -Patient was diagnosed with possible POTS during recent hospitalization -Blood pressure borderline with systolic in the low 505W.  Patient currently asymptomatic.  Patient was on midodrine during previous hospitalization however currently not on midodrine.  Lopressor has been discontinued however patient started to become slightly tachycardic and We will resume Lopressor at a low dose and monitor blood pressure closely.  If patient becomes hypotensive will need to place back on midodrine.  Follow.  Iron deficiency anemia -Hemoglobin stable at 10.5.  Continue oral iron supplementation.  Follow H&H.   Chronic pain -Continue current pain regimen.   Morbid obesity -Outpatient follow-up  Generalized deconditioning -PT recommends nursing home placement.  As per Education officer, museum and care management, finding a SNF bed would be very unlikely for her.  Patient likely to be discharged home with home health services.  History of Crohn's disease status post diverting colostomy -Continue ostomy care.  Outpatient follow-up with GI  Obstructive sleep apnea/pulmonary hypertension -Refuses CPAP  Hypomagnesemia -Magnesium at 2.1.     DVT prophylaxis: Lovenox Code Status: Full Family Communication: Updated patient.  No family at bedside. Disposition Plan: Home with home health hopefully the next 24 to 48 hours.   Consultants:   Ophthalmology: Dr. Manuella Ghazi 08/29/2018  Procedures:   None  Antimicrobials:   Antibiotic eyedrops   Subjective: Patient in wheelchair working with occupational therapy.  Patient states pain medication no longer helping her groin pain.  Epigastric upper abdominal pain  slowly improving.  No  nausea.  No emesis.    Objective: Vitals:   09/03/18 0451 09/03/18 1317 09/03/18 2103 09/04/18 0528  BP: 95/63 (!) 99/52 (!) 100/53 104/72  Pulse: 79 80 85 78  Resp: 18 16 17 18   Temp: 97.6 F (36.4 C) 98.3 F (36.8 C) 98.4 F (36.9 C) 98.1 F (36.7 C)  TempSrc: Oral Oral Oral Oral  SpO2: 97% 100% 98% 97%  Weight:      Height:        Intake/Output Summary (Last 24 hours) at 09/04/2018 1338 Last data filed at 09/03/2018 1826 Gross per 24 hour  Intake 480 ml  Output -  Net 480 ml   Filed Weights   08/28/18 2021  Weight: 134.2 kg    Examination:  General exam: Left eye shield on. Respiratory system: CTAB.  No wheezes, no crackles, no rhonchi.  Respiratory effort normal. Cardiovascular system: Regular rate rhythm no murmurs rubs or gallops.  No JVD.  No lower extremity edema.   Gastrointestinal system: Abdomen is soft, nontender, nondistended, positive bowel sounds.  No rebound.  No guarding. Central nervous system: Alert and oriented. No focal neurological deficits. Extremities: Symmetric 5 x 5 power. Skin: No rashes, lesions or ulcers Psychiatry: Judgement and insight appear normal. Mood & affect appropriate.     Data Reviewed: I have personally reviewed following labs and imaging studies  CBC: Recent Labs  Lab 08/28/18 2219 08/31/18 0533 09/02/18 0329 09/03/18 0358 09/04/18 0929  WBC 7.1 6.2 6.0 5.4  --   NEUTROABS  --  3.1 3.4 2.7  --   HGB 10.0* 10.6* 9.9* 10.1* 10.5*  HCT 32.1* 33.2* 31.6* 32.8* 31.9*  MCV 86.8 84.1 87.5 87.9  --   PLT 323 301 253 218  --    Basic Metabolic Panel: Recent Labs  Lab 08/28/18 2219 08/31/18 0533 09/02/18 0329 09/03/18 0358 09/04/18 0750  NA 141 140 140 139 139  K 3.5 3.9 3.3* 3.8 4.7  CL 114* 115* 112* 111 110  CO2 24 20* 22 23 20*  GLUCOSE 108* 85 96 74 97  BUN 6 <5* <5* <5* <5*  CREATININE 0.84 0.72 0.61 0.64 0.79  CALCIUM 8.3* 8.1* 8.0* 8.0* 7.9*  MG  --  1.6* 1.8 1.7 2.1   GFR: Estimated Creatinine  Clearance: 126 mL/min (by C-G formula based on SCr of 0.79 mg/dL). Liver Function Tests: Recent Labs  Lab 08/28/18 2219 09/02/18 0329  AST 23 13*  ALT 16 11  ALKPHOS 89 79  BILITOT 0.6 0.4  PROT 5.7* 5.0*  ALBUMIN 1.9* 1.9*   Recent Labs  Lab 09/02/18 0329  LIPASE 22   No results for input(s): AMMONIA in the last 168 hours. Coagulation Profile: No results for input(s): INR, PROTIME in the last 168 hours. Cardiac Enzymes: No results for input(s): CKTOTAL, CKMB, CKMBINDEX, TROPONINI in the last 168 hours. BNP (last 3 results) No results for input(s): PROBNP in the last 8760 hours. HbA1C: No results for input(s): HGBA1C in the last 72 hours. CBG: No results for input(s): GLUCAP in the last 168 hours. Lipid Profile: No results for input(s): CHOL, HDL, LDLCALC, TRIG, CHOLHDL, LDLDIRECT in the last 72 hours. Thyroid Function Tests: No results for input(s): TSH, T4TOTAL, FREET4, T3FREE, THYROIDAB in the last 72 hours. Anemia Panel: No results for input(s): VITAMINB12, FOLATE, FERRITIN, TIBC, IRON, RETICCTPCT in the last 72 hours. Sepsis Labs: No results for input(s): PROCALCITON, LATICACIDVEN in the last 168 hours.  No results found for this  or any previous visit (from the past 240 hour(s)).       Radiology Studies: No results found.      Scheduled Meds: . enoxaparin (LOVENOX) injection  40 mg Subcutaneous Q24H  . famotidine  20 mg Oral BID  . feeding supplement (ENSURE ENLIVE)  237 mL Oral Q24H  . ferrous gluconate  324 mg Oral BID WC  . multivitamin with minerals  1 tablet Oral Daily  . nutrition supplement (JUVEN)  1 packet Oral BID BM  . polyethylene glycol  17 g Oral Daily  . prednisoLONE acetate  1 drop Left Eye Q6H  . senna-docusate  1 tablet Oral BID  . sodium chloride flush  3 mL Intravenous Q12H  . trimethoprim-polymyxin b  1 drop Left Eye Q6H   Continuous Infusions: . sodium chloride       LOS: 7 days    Time spent: 35 minutes    Irine Seal, MD [facility] Pager [phone]  If 7PM-7AM, please contact night-coverage [email or web address] Password [email or web address] 09/04/2018, 1:38 PM

## 2018-09-04 NOTE — Care Management Note (Addendum)
Case Management Note  Patient Details  Name: Dominique [name] MRN: [id number] Date of Birth: 04-18-1981  Subjective/Objective:                  Discharged to home with home health,  Action/Plan: [facility] has agreed on [id number] to take /orders faxed to [phone] as requested. Expected Discharge Date:                  Expected Discharge Plan:  [place]  In-House Referral:     Discharge planning Services  CM Consult  Post Acute Care Choice:  Home Health, Durable Medical Equipment Choice offered to:  Patient  DME Arranged:  3-N-1 DME Agency:     HH Arranged:  RN, PT, OT, Nurse's Aide, Social Work [place] [facility] [place] Agency:[facility]  Status of Service:  Completed, signed off  If discussed at [employer], dates discussed:    Additional Comments:  Leeroy Cha, RN 09/04/2018, 12:43 PM

## 2018-09-04 NOTE — Progress Notes (Signed)
Occupational Therapy Treatment Patient Details Name: Dominique [name] MRN: [id number] DOB: 1981/03/26 Today's Date: 09/04/2018    History of present illness 37 y.o. 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    OT comments  Finished education and DME recommendation.  [facility] is looking into coverage for tub DME.  Pt is near goal level  Follow Up Recommendations  Supervision/Assistance - 24 hour; if there is another service would benefit from an OT consult to practice tub bench transfer and help with environmental cues for decreased vision   Equipment Recommendations  3 in 1 bedside commode;Tub/shower bench;Hospital bed(wide; split rail bed; air overlay mattress)    Recommendations for Other Services      Precautions / Restrictions Precautions Precautions: Fall Precaution Comments: colostomy; pt denies h/o falls in past 1 year.  Cornea surgery:  eye with clear patch Restrictions Weight Bearing Restrictions: No       Mobility Bed Mobility         Supine to sit: Supervision Sit to supine: Supervision      Transfers   Equipment used: None   Sit to Stand: Supervision Stand pivot transfers: Supervision            Balance                                           ADL either performed or assessed with clinical judgement   ADL                           Toilet Transfer: Supervision/safety;Ambulation;Comfort height toilet;Grab bars   Toileting- Clothing Manipulation and Hygiene: Supervision/safety         General ADL Comments: pt has AE at home for ADLs; will be mod I/supervision with this.  Taken to gym to practice tub transfer and recommend DME.  Pt would benefit from tub bench.  She needed miin guard for  transfer onto this and min A for getting off as she felt like she could fall. Vision has improved but she reports she is still having visual difficulties     Vision       Perception     Praxis      Cognition Arousal/Alertness: Awake/alert Behavior During Therapy: WFL for tasks assessed/performed Overall Cognitive Status: Within Functional Limits for tasks assessed                                          Exercises     Shoulder Instructions       General Comments      Pertinent Vitals/ Pain       Pain Score: 7  Pain Location: stomachache Pain Descriptors / Indicators: Aching Pain Intervention(s): Limited activity within patient's tolerance;Monitored during session;Premedicated before session;Repositioned;Patient requesting pain meds-RN notified  Home Living                                          Prior Functioning/Environment  Frequency           Progress Toward Goals  OT Goals(current goals can now be found in the care plan section)  Progress towards OT goals: Progressing toward goals     Plan      Co-evaluation                 AM-PAC OT "6 Clicks" Daily Activity     Outcome Measure   Help from another person eating meals?: None Help from another person taking care of personal grooming?: A Little Help from another person toileting, which includes using toliet, bedpan, or urinal?: A Little Help from another person bathing (including washing, rinsing, drying)?: A Little Help from another person to put on and taking off regular upper body clothing?: A Little Help from another person to put on and taking off regular lower body clothing?: A Little 6 Click Score: 19    End of Session    OT Visit Diagnosis: Unsteadiness on feet (R26.81);Muscle weakness (generalized) (M62.81)   Activity Tolerance Patient tolerated treatment well   Patient Left in chair;with call bell/phone within reach    Nurse Communication          Time: 0100-7121 OT Time Calculation (min): 22 min  Charges: OT General Charges $OT Visit: 1 Visit OT Treatments $Self Care/Home Management : 8-22 mins  [name], OTR/L Acute Rehabilitation Services [phone] [place] pager [phone] office 09/04/2018   Dominique [name] 09/04/2018, 2:46 PM

## 2018-09-04 NOTE — Progress Notes (Signed)
    Durable Medical Equipment  (From admission, onward)         Start     Ordered   09/04/18 1405  For home use only DME Hospital bed  Once    Question Answer Comment  Patient has (list medical condition): pressure ulcer to the sacrum   The above medical condition requires: Patient requires the ability to reposition frequently   Bed type Semi-electric   Support Surface: Gel Overlay      09/04/18 1407   09/04/18 1217  For home use only DME Walker rolling  Once    Question:  Patient needs a walker to treat with the following condition  Answer:  Debility   09/04/18 1216   09/03/18 1438  For home use only DME 3 n 1  Once     09/03/18 1437

## 2018-09-05 MED ORDER — ONDANSETRON HCL 4 MG/2ML IJ SOLN
4.0000 mg | Freq: Three times a day (TID) | INTRAMUSCULAR | Status: DC | PRN
  Administered 2018-09-05 – 2018-09-07 (×3): 4 mg via INTRAVENOUS
  Filled 2018-09-05 (×3): qty 2

## 2018-09-05 MED ORDER — HYDROMORPHONE HCL 1 MG/ML IJ SOLN
1.0000 mg | Freq: Once | INTRAMUSCULAR | Status: AC | PRN
  Administered 2018-09-05: 1 mg via INTRAVENOUS
  Filled 2018-09-05: qty 1

## 2018-09-05 MED ORDER — HYDROMORPHONE HCL 1 MG/ML IJ SOLN
1.0000 mg | Freq: Once | INTRAMUSCULAR | Status: AC
  Administered 2018-09-05: 1 mg via INTRAVENOUS
  Filled 2018-09-05: qty 1

## 2018-09-05 NOTE — Progress Notes (Signed)
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  She had been hospitalized since 07/04/2018: Hypotensive on admission, initially thought to be related to skin infections on abdomen, later thought to be possibly POTS, and treated with migraine.  Found to be hypothermic, which resolved with fluid resuscitation.  She was supposed to be discharged to nursing home and social worker was working at but was difficult placement.  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  On 08/24/2018, corneal perforation was noted and patient was transferred to [facility] where she received keratoplasty on 08/27/2018 and was transferred back to [place] [facility] the following day.     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  Outpatient follow-up with surgeon in clinic at [facility] in 1 to 2 weeks.  If the patient remains inpatient till then, will reconsult ophthalmology who can evaluate the patient in the hospital. -Keep eye shield as per ophthalmology recommendations  Abdominal pain -Questionable cause.    Abdominal pain improved in the epigastric and upper abdominal region.  Patient states has been having bowel movements.  Abdominal films done were negative for any bowel obstruction or any acute abnormalities. Pepcid and  antacid were added on 08/31/2018.    Abdominal exam unremarkable.  Continue supportive care.  Chronic intertrigo with chronic wounds -Secondary to morbid obesity, poor healing  -Follow wound care consult recommendations -No signs of active bacterial infection  Hypotension -Patient was diagnosed with possible POTS during recent hospitalization -Blood pressure borderline with systolic in the low 433I.  Patient currently asymptomatic.  Patient was on midodrine during previous hospitalization however currently not on midodrine.  Lopressor has been discontinued however patient started to become slightly tachycardic and Lopressor has been resumed. -Place back on midodrine 5 mg p.o. 3 times daily.  Follow  Iron deficiency anemia -Hemoglobin stable at 10.5.  Continue oral iron supplementation.  Follow H&H.   Chronic pain -Breakthrough oxycodone increased to 15 mg every 4 hours as needed.  Continue current pain regimen.   Morbid obesity -Outpatient follow-up  Generalized deconditioning -PT recommends nursing home placement.  As per Education officer, museum and care management, finding a SNF bed would be very unlikely for her.  Patient likely to be discharged home with home health services.  History of Crohn's disease status post diverting colostomy -Continue ostomy care.  Outpatient follow-up with GI  Obstructive sleep apnea/pulmonary hypertension -Refuses CPAP  Hypomagnesemia -Magnesium at 2.1.     DVT prophylaxis: Lovenox Code Status: Full Family Communication: Updated patient.  No family at bedside. Disposition Plan: Home with home health hopefully the next 24 to 48 hours.   Consultants:   Ophthalmology: Dr. Manuella Ghazi 08/29/2018  Procedures:   None  Antimicrobials:   Antibiotic eyedrops   Subjective: Patient sitting up in chair bent over complaining of pain in the groin area.  Patient states upper epigastric abdominal pain has  improved.  No nausea or emesis.  Patient states  she has received some pain medication waiting for it to kick in.  Objective: Vitals:   09/04/18 0528 09/04/18 1356 09/04/18 2131 09/05/18 0606  BP: 104/72 106/74 (!) 89/45 (!) 92/51  Pulse: 78 (!) 116 98 89  Resp: 18 20 17 17   Temp: 98.1 F (36.7 C) 98.4 F (36.9 C) 97.9 F (36.6 C) 97.8 F (36.6 C)  TempSrc: Oral Oral Oral Oral  SpO2: 97% 100% 98% 98%  Weight:      Height:        Intake/Output Summary (Last 24 hours) at 09/05/2018 1308 Last data filed at 09/04/2018 2146 Gross per 24 hour  Intake 240 ml  Output 350 ml  Net -110 ml   Filed Weights   08/28/18 2021  Weight: 134.2 kg    Examination:  General exam: Left eye shield on. Respiratory system: Lungs clear to auscultation bilaterally.  No wheezes, no crackles, no rhonchi.  Normal respiratory effort.  Cardiovascular system: RRR no murmurs rubs or gallops.  No JVD.  No lower extremity edema.   Gastrointestinal system: Abdomen is nontender, nondistended, soft, positive bowel sounds.  No rebound.  No guarding.  Central nervous system: Alert and oriented. No focal neurological deficits. Extremities: Symmetric 5 x 5 power. Skin: No rashes, lesions or ulcers Psychiatry: Judgement and insight appear normal. Mood & affect appropriate.     Data Reviewed: I have personally reviewed following labs and imaging studies  CBC: Recent Labs  Lab 08/31/18 0533 09/02/18 0329 09/03/18 0358 09/04/18 0929  WBC 6.2 6.0 5.4  --   NEUTROABS 3.1 3.4 2.7  --   HGB 10.6* 9.9* 10.1* 10.5*  HCT 33.2* 31.6* 32.8* 31.9*  MCV 84.1 87.5 87.9  --   PLT 301 253 218  --    Basic Metabolic Panel: Recent Labs  Lab 08/31/18 0533 09/02/18 0329 09/03/18 0358 09/04/18 0750  NA 140 140 139 139  K 3.9 3.3* 3.8 4.7  CL 115* 112* 111 110  CO2 20* 22 23 20*  GLUCOSE 85 96 74 97  BUN <5* <5* <5* <5*  CREATININE 0.72 0.61 0.64 0.79  CALCIUM 8.1* 8.0* 8.0* 7.9*  MG 1.6* 1.8 1.7 2.1   GFR: Estimated Creatinine Clearance: 126 mL/min (by  C-G formula based on SCr of 0.79 mg/dL). Liver Function Tests: Recent Labs  Lab 09/02/18 0329  AST 13*  ALT 11  ALKPHOS 79  BILITOT 0.4  PROT 5.0*  ALBUMIN 1.9*   Recent Labs  Lab 09/02/18 0329  LIPASE 22   No results for input(s): AMMONIA in the last 168 hours. Coagulation Profile: No results for input(s): INR, PROTIME in the last 168 hours. Cardiac Enzymes: No results for input(s): CKTOTAL, CKMB, CKMBINDEX, TROPONINI in the last 168 hours. BNP (last 3 results) No results for input(s): PROBNP in the last 8760 hours. HbA1C: No results for input(s): HGBA1C in the last 72 hours. CBG: No results for input(s): GLUCAP in the last 168 hours. Lipid Profile: No results for input(s): CHOL, HDL, LDLCALC, TRIG, CHOLHDL, LDLDIRECT in the last 72 hours. Thyroid Function Tests: No results for input(s): TSH, T4TOTAL, FREET4, T3FREE, THYROIDAB in the last 72 hours. Anemia Panel: No results for input(s): VITAMINB12, FOLATE, FERRITIN, TIBC, IRON, RETICCTPCT in the last 72 hours. Sepsis Labs: No results for input(s): PROCALCITON, LATICACIDVEN in the last 168 hours.  No results found for this or any previous visit (from the past 240 hour(s)).  Radiology Studies: No results found.      Scheduled Meds: . enoxaparin (LOVENOX) injection  40 mg Subcutaneous Q24H  . famotidine  20 mg Oral BID  . feeding supplement (ENSURE ENLIVE)  237 mL Oral Q24H  . ferrous gluconate  324 mg Oral BID WC  . metoprolol tartrate  12.5 mg Oral BID  . multivitamin with minerals  1 tablet Oral Daily  . nutrition supplement (JUVEN)  1 packet Oral BID BM  . pantoprazole  40 mg Oral BID AC  . polyethylene glycol  17 g Oral Daily  . prednisoLONE acetate  1 drop Left Eye Q6H  . senna-docusate  1 tablet Oral BID  . sodium chloride flush  3 mL Intravenous Q12H  . trimethoprim-polymyxin b  1 drop Left Eye Q6H   Continuous Infusions: . sodium chloride       LOS: 8 days    Time spent: 35  minutes    Irine Seal, MD [facility] Pager [phone]  If 7PM-7AM, please contact night-coverage [email or web address] Password [email or web address] 09/05/2018, 1:08 PM

## 2018-09-06 MED ORDER — HYDROMORPHONE HCL 1 MG/ML IJ SOLN
1.0000 mg | Freq: Once | INTRAMUSCULAR | Status: AC
  Administered 2018-09-06: 1 mg via INTRAVENOUS
  Filled 2018-09-06: qty 1

## 2018-09-06 MED ORDER — MIDODRINE HCL 5 MG PO TABS
5.0000 mg | ORAL_TABLET | Freq: Three times a day (TID) | ORAL | Status: DC
  Administered 2018-09-06 – 2018-09-07 (×5): 5 mg via ORAL
  Filled 2018-09-06 (×5): qty 1

## 2018-09-06 NOTE — Progress Notes (Signed)
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  She had been hospitalized since 07/04/2018: Hypotensive on admission, initially thought to be related to skin infections on abdomen, later thought to be possibly POTS, and treated with migraine.  Found to be hypothermic, which resolved with fluid resuscitation.  She was supposed to be discharged to nursing home and social worker was working at but was difficult placement.  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  On 08/24/2018, corneal perforation was noted and patient was transferred to [facility] where she received keratoplasty on 08/27/2018 and was transferred back to [place] [facility] the following day.     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  Outpatient follow-up with surgeon in clinic at [facility] in 1 to 2 weeks.  If the patient remains inpatient till then, will reconsult ophthalmology who can evaluate the patient in the hospital. -Keep eye shield as per ophthalmology recommendations  Abdominal pain -Questionable cause.    Abdominal pain improved in the epigastric and upper abdominal region.  Patient states has been having bowel movements.  Abdominal films done were negative for any bowel obstruction or any acute abnormalities. Pepcid and  antacid were added on 08/31/2018.    Abdominal exam unremarkable.  Continue supportive care.  Chronic intertrigo with chronic wounds -Secondary to morbid obesity, poor healing  -Follow wound care consult recommendations -No signs of active bacterial infection  Hypotension -Patient was diagnosed with possible POTS during recent hospitalization -Blood pressure borderline with systolic in the 95A s.  Patient currently asymptomatic.  Patient was on midodrine during previous hospitalization however currently not on midodrine.  Lopressor has been discontinued however patient started to become slightly tachycardic and Lopressor has been resumed at 12.5 twice daily.. -Start patient on midodrine 5 mg p.o. 3 times daily and uptitrate as needed for hypotension. Follow  Iron deficiency anemia -Hemoglobin stable at 10.5.  Continue oral iron supplementation.  Follow H&H.   Chronic pain -Breakthrough oxycodone increased to 15 mg every 4 hours as needed.  Continue current pain regimen.   Morbid obesity -Outpatient follow-up  Generalized deconditioning -PT recommends nursing home placement.  As per Education officer, museum and care management, finding a SNF bed would be very unlikely for her.  Patient to be discharged home with home health services.   History of Crohn's disease status post diverting colostomy -Continue ostomy care.  Outpatient follow-up with GI  Obstructive sleep apnea/pulmonary hypertension -Refuses CPAP  Hypomagnesemia -Magnesium at 2.1.     DVT prophylaxis: Lovenox Code Status: Full Family Communication: Updated patient.  No family at bedside. Disposition Plan: Home with home health hopefully tomorrow once home health has been set up and home health supplies and equipment delivered and hypotension improved.    Consultants:   Ophthalmology: Dr. Manuella Ghazi 08/29/2018  Procedures:   None  Antimicrobials:   Antibiotic eyedrops   Subjective:  Patient sitting up in chair.   Patient states having some pain in the groin region.  Patient states upper epigastric abdominal pain improved.  No nausea or emesis.  Patient states has supplies and equipment for home health have not been delivered yet and does not feel it would be ideal to be discharged today as home health has not been fully set up.    Objective: Vitals:   09/05/18 0606 09/05/18 1337 09/05/18 2147 09/06/18 0555  BP: (!) 92/51 97/73 92/70  (!) 82/40  Pulse: 89 93 (!) 104 86  Resp: 17 17 16 16   Temp: 97.8 F (36.6 C) 97.9 F (36.6 C) 97.9 F (36.6 C) 98.2 F (36.8 C)  TempSrc: Oral Oral Oral Oral  SpO2: 98% 100% 100% 100%  Weight:      Height:        Intake/Output Summary (Last 24 hours) at 09/06/2018 1322 Last data filed at 09/06/2018 0600 Gross per 24 hour  Intake 355 ml  Output -  Net 355 ml   Filed Weights   08/28/18 2021  Weight: 134.2 kg    Examination:  General exam: Left eye shield on. Respiratory system: CTAB.  No wheezes, no crackles, no rhonchi.  Normal respiratory effort.  Cardiovascular system: Regular rate and rhythm no murmurs rubs or gallops.  No JVD.  No lower extremity edema.  Gastrointestinal system: Abdomen is soft, nontender, nondistended, positive bowel sounds.  No rebound.  No guarding. Central nervous system: Alert and oriented. No focal neurological deficits. Extremities: Symmetric 5 x 5 power. Skin: No rashes, lesions or ulcers Psychiatry: Judgement and insight appear normal. Mood & affect appropriate.     Data Reviewed: I have personally reviewed following labs and imaging studies  CBC: Recent Labs  Lab 08/31/18 0533 09/02/18 0329 09/03/18 0358 09/04/18 0929  WBC 6.2 6.0 5.4  --   NEUTROABS 3.1 3.4 2.7  --   HGB 10.6* 9.9* 10.1* 10.5*  HCT 33.2* 31.6* 32.8* 31.9*  MCV 84.1 87.5 87.9  --   PLT 301 253 218  --    Basic Metabolic Panel: Recent Labs  Lab 08/31/18 0533 09/02/18 0329 09/03/18 0358 09/04/18 0750  NA 140 140 139 139  K 3.9 3.3*  3.8 4.7  CL 115* 112* 111 110  CO2 20* 22 23 20*  GLUCOSE 85 96 74 97  BUN <5* <5* <5* <5*  CREATININE 0.72 0.61 0.64 0.79  CALCIUM 8.1* 8.0* 8.0* 7.9*  MG 1.6* 1.8 1.7 2.1   GFR: Estimated Creatinine Clearance: 126 mL/min (by C-G formula based on SCr of 0.79 mg/dL). Liver Function Tests: Recent Labs  Lab 09/02/18 0329  AST 13*  ALT 11  ALKPHOS 79  BILITOT 0.4  PROT 5.0*  ALBUMIN 1.9*   Recent Labs  Lab 09/02/18 0329  LIPASE 22   No results for input(s): AMMONIA in the last 168 hours. Coagulation Profile: No results for input(s): INR, PROTIME in the last 168 hours. Cardiac Enzymes: No results for input(s): CKTOTAL, CKMB, CKMBINDEX, TROPONINI in the last 168 hours. BNP (last 3 results) No results for input(s): PROBNP in the last 8760 hours. HbA1C: No results for input(s): HGBA1C in the last 72 hours. CBG: No results for input(s): GLUCAP in the last 168 hours. Lipid Profile: No results for input(s): CHOL, HDL, LDLCALC, TRIG, CHOLHDL, LDLDIRECT in the last 72 hours. Thyroid Function Tests: No results for input(s): TSH, T4TOTAL, FREET4, T3FREE, THYROIDAB in the last 72 hours. Anemia Panel: No results for input(s): VITAMINB12, FOLATE, FERRITIN,  TIBC, IRON, RETICCTPCT in the last 72 hours. Sepsis Labs: No results for input(s): PROCALCITON, LATICACIDVEN in the last 168 hours.  No results found for this or any previous visit (from the past 240 hour(s)).       Radiology Studies: No results found.      Scheduled Meds: . enoxaparin (LOVENOX) injection  40 mg Subcutaneous Q24H  . famotidine  20 mg Oral BID  . feeding supplement (ENSURE ENLIVE)  237 mL Oral Q24H  . ferrous gluconate  324 mg Oral BID WC  . metoprolol tartrate  12.5 mg Oral BID  . multivitamin with minerals  1 tablet Oral Daily  . nutrition supplement (JUVEN)  1 packet Oral BID BM  . pantoprazole  40 mg Oral BID AC  . polyethylene glycol  17 g Oral Daily  . prednisoLONE acetate  1 drop Left Eye  Q6H  . senna-docusate  1 tablet Oral BID  . sodium chloride flush  3 mL Intravenous Q12H  . trimethoprim-polymyxin b  1 drop Left Eye Q6H   Continuous Infusions: . sodium chloride       LOS: 9 days    Time spent: 35 minutes    Irine Seal, MD [facility] Pager [phone]  If 7PM-7AM, please contact night-coverage [email or web address] Password [email or web address] 09/06/2018, 1:22 PM

## 2018-09-07 MED ORDER — PANTOPRAZOLE SODIUM 40 MG PO TBEC: 40.0000 mg | DELAYED_RELEASE_TABLET | Freq: Two times a day (BID) | ORAL | 0 refills | Status: AC

## 2018-09-07 MED ORDER — HYDROMORPHONE HCL 1 MG/ML IJ SOLN
1.0000 mg | Freq: Once | INTRAMUSCULAR | Status: AC
  Administered 2018-09-07: 1 mg via INTRAVENOUS
  Filled 2018-09-07: qty 1

## 2018-09-07 MED ORDER — FAMOTIDINE 20 MG PO TABS: 20.0000 mg | ORAL_TABLET | Freq: Two times a day (BID) | ORAL | 0 refills | Status: AC

## 2018-09-07 MED ORDER — POLYETHYLENE GLYCOL 3350 17 G PO PACK: 17.0000 g | PACK | Freq: Every day | ORAL | 0 refills | Status: AC

## 2018-09-07 MED ORDER — MIDODRINE HCL 5 MG PO TABS: 5.0000 mg | ORAL_TABLET | Freq: Three times a day (TID) | ORAL | 0 refills | Status: AC

## 2018-09-07 MED ORDER — METHOCARBAMOL 500 MG PO TABS: 500.0000 mg | ORAL_TABLET | Freq: Four times a day (QID) | ORAL | 0 refills | Status: AC | PRN

## 2018-09-07 MED ORDER — OXYCODONE HCL 15 MG PO TABS: 15.0000 mg | ORAL_TABLET | ORAL | 0 refills | Status: AC | PRN

## 2018-09-07 MED ORDER — METOPROLOL TARTRATE 25 MG PO TABS: 12.5000 mg | ORAL_TABLET | Freq: Two times a day (BID) | ORAL | 0 refills | Status: AC

## 2018-09-07 MED ORDER — SENNOSIDES-DOCUSATE SODIUM 8.6-50 MG PO TABS: 1.0000 | ORAL_TABLET | Freq: Two times a day (BID) | ORAL | Status: AC

## 2018-09-07 MED ORDER — PREDNISOLONE ACETATE 1 % OP SUSP: 1.0000 [drp] | Freq: Four times a day (QID) | OPHTHALMIC | 0 refills | Status: AC

## 2018-09-07 NOTE — Discharge Summary (Signed)
Physician Discharge Summary  Dominique [name] BJS:[id number] DOB: May 08, 1981 DOA: 08/28/2018  PCP: Benito Mccreedy, MD  Admit date: 08/28/2018 Discharge date: 09/07/2018  Time spent: 60 minutes  Recommendations for Outpatient Follow-up:  1. Follow-up with ophthalmologist in 1 week. 2. Follow-up with Benito Mccreedy, MD in 2 weeks.  On follow-up patient will need a basic metabolic profile done to follow-up on electrolytes and renal function.  Patient also need a magnesium level checked.  Patient's pain will need to be managed by PCP or patient may be referred to a pain clinic for further management of her chronic pain. 3. Patient be discharged home with home health services.   Discharge Diagnoses:  Active Problems:   Morbid obesity- BMI 67.5   Abdominal pain   Pressure ulcer   Crohn's colitis with perforation s/p left colectomy/colostomy 2016   OSA on CPAP   Keratoconus   Intertrigo   Discharge Condition: Stable and improved  Diet recommendation: Regular  Filed Weights   08/28/18 2021 09/07/18 0500  Weight: 134.2 kg 134.3 kg    History of present illness:  Per Dr Si Raider Amandalee Lacap is a 37 y.o. 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   Hospitalized since 10/12. Hypotensive on admission, initially thought to be related to skin infections on abdomen, later thought to be possibly POTS, and treated with midodrine. Found to be hypoatremic, which resolved with fluid resuscitation. Also with iron deficiency anemia, thought to be 2/2 diet and chronic loss from chronic wounds, received 1 unit PRBCs. Also found to have prerenal aki and metabolic acidosis 2/2 GI losses 2/2 diverting ostomy 2/2 crohn's disease. Diagnosed with bilateral keratoconus,  ophthomology followed, on 12/2 corneal perforation noted, transferred to [place], where received keratoplasty on 12/5, and transferred back to [place] the following day.  Patient has no complaints. No significant eye pain; has a patch on that eye   Hospital Course:  Keratoconus with left corneal perforation status post keratoplasty on 08/27/2018 -Patient was maintained on continue prednisolone and antibiotics eyedrops as per ophthalmology. Outpatient follow-up with surgeon in clinic at [facility] in 1 to 2 weeks.  -Keep eye shield as per ophthalmology recommendations  Abdominal pain -Questionable cause.   Abdominal pain improved in the epigastric and upper abdominal region with addition of Pepcid and PPI.Marland Kitchen  Patient states has been having bowel movements.  Abdominal films done were negative for any bowel obstruction or any acute abnormalities. Pepcid and antacid were added on 08/31/2018.   Abdominal exam unremarkable.    Outpatient follow-up with PCP.  Chronic intertrigo with chronic wounds -Secondary to morbid obesity, poor healing  -Follow wound care consult recommendations -No signs of active bacterial infection  Hypotension -Patient was diagnosed with possible POTS during recent hospitalization -Blood pressure borderline with systolic in the 60V s.  Patient currently asymptomatic.  Patient was on midodrine during previous hospitalization however midodrine initially held after discharge from [facility].  Patient's Lopressor was initially discontinued however due to tachycardia was resumed at a low dose of 12.5 mg twice daily.  Patient subsequently started on midodrine 5 mg p.o. 3 times daily.  Outpatient follow-up with PCP.    Iron deficiency anemia -Hemoglobin stable at 10.5.    Patient maintained on oral iron supplementation.   Chronic pain -Breakthrough oxycodone increased to 15 mg every 4 hours as needed  for better pain control.  Outpatient follow-up with PCP.  Morbid  obesity -Outpatient follow-up  Generalized deconditioning -PT recommended nursing home placement.As per Education officer, museum and care management, finding a SNF bed would be very unlikely for her.  Patient to be discharged home with home health services.   History of Crohn's disease status post diverting colostomy -Continue ostomy care. Outpatient follow-up with GI  Obstructive sleep apnea/pulmonary hypertension -Refused CPAP during the hospitalization.  Hypomagnesemia -Repleted.  Outpatient follow-up.    Procedures:  None  Consultations:  Ophthalmology: Dr. Manuella Ghazi 08/29/2018  Discharge Exam: Vitals:   09/07/18 0720 09/07/18 1404  BP: (!) 96/56 (!) 91/37  Pulse: 86 69  Resp: 18 18  Temp: 98.2 F (36.8 C) 98.3 F (36.8 C)  SpO2: 100% 99%    General: NAD Cardiovascular: RRR Respiratory: CTAB  Discharge Instructions   Discharge Instructions    Diet general   Complete by:  As directed    Increase activity slowly   Complete by:  As directed      Allergies as of 09/07/2018      Reactions   Other Shortness Of Breath, Swelling   Tree nuts   Penicillins Other (See Comments)   Unknown childhood allergy Has patient had a PCN reaction causing immediate rash, facial/tongue/throat swelling, SOB or lightheadedness with hypotension: Unknown Has patient had a PCN reaction causing severe rash involving mucus membranes or skin necrosis: Unknown Has patient had a PCN reaction that required hospitalization: Unknown Has patient had a PCN reaction occurring within the last 10 years: Unknown If all of the above answers are "NO", then may proceed with Cephalosporin use.      Medication List    STOP taking these medications   sulfamethoxazole-trimethoprim 400-80 MG tablet Commonly known as:  BACTRIM   tobramycin 0.3 % ophthalmic solution Commonly known as:  TOBREX     TAKE these medications   acetaminophen 500 MG tablet Commonly known as:  TYLENOL Take 500 mg by mouth  every 6 (six) hours as needed for mild pain, moderate pain or headache.   albuterol (2.5 MG/3ML) 0.083% nebulizer solution Commonly known as:  PROVENTIL Take 3 mLs (2.5 mg total) by nebulization 2 (two) times daily as needed for wheezing.   bisacodyl 5 MG EC tablet Commonly known as:  DULCOLAX Take 1 tablet (5 mg total) by mouth daily as needed for moderate constipation.   famotidine 20 MG tablet Commonly known as:  PEPCID Take 1 tablet (20 mg total) by mouth 2 (two) times daily.   nutrition supplement (JUVEN) Pack Take 1 packet by mouth 2 (two) times daily between meals.   feeding supplement (ENSURE ENLIVE) Liqd Take 237 mLs by mouth daily.   feeding supplement (PRO-STAT SUGAR FREE 64) Liqd Take 30 mLs by mouth daily.   ferrous gluconate 324 MG tablet Commonly known as:  FERGON Take 1 tablet (324 mg total) by mouth 2 (two) times daily with a meal.   methocarbamol 500 MG tablet Commonly known as:  ROBAXIN Take 1 tablet (500 mg total) by mouth every 6 (six) hours as needed for muscle spasms.   metoprolol tartrate 25 MG tablet Commonly known as:  LOPRESSOR Take 0.5 tablets (12.5 mg total) by mouth 2 (two) times daily. What changed:    medication strength  how much to take   midodrine 5 MG tablet Commonly known as:  PROAMATINE Take 1 tablet (5 mg total) by mouth 3 (three) times daily with meals.   multivitamin with minerals Tabs  tablet Take 1 tablet by mouth daily.   oxyCODONE 15 MG immediate release tablet Commonly known as:  ROXICODONE Take 1 tablet (15 mg total) by mouth every 4 (four) hours as needed for moderate pain. What changed:    medication strength  how much to take  reasons to take this   pantoprazole 40 MG tablet Commonly known as:  PROTONIX Take 1 tablet (40 mg total) by mouth 2 (two) times daily before a meal.   polyethylene glycol packet Commonly known as:  MIRALAX / GLYCOLAX Take 17 g by mouth daily. What changed:    when to take  this  reasons to take this   prednisoLONE acetate 1 % ophthalmic suspension Commonly known as:  PRED FORTE Place 1 drop into the left eye every 6 (six) hours for 10 days.   senna-docusate 8.6-50 MG tablet Commonly known as:  Senokot-S Take 1 tablet by mouth 2 (two) times daily.   trimethoprim-polymyxin b ophthalmic solution Commonly known as:  POLYTRIM Place 1 drop into the left eye every 6 (six) hours.            Durable Medical Equipment  (From admission, onward)         Start     Ordered   09/04/18 1417  For home use only DME Tub bench  Once     09/04/18 1418   09/04/18 1405  For home use only DME Hospital bed  Once    Question Answer Comment  Patient has (list medical condition): pressure ulcer to the sacrum   The above medical condition requires: Patient requires the ability to reposition frequently   Bed type Semi-electric   Support Surface: Gel Overlay      09/04/18 1407   09/04/18 1217  For home use only DME Walker rolling  Once    Question:  Patient needs a walker to treat with the following condition  Answer:  Debility   09/04/18 1216   09/03/18 1438  For home use only DME 3 n 1  Once     09/03/18 1437         Allergies  Allergen Reactions  . Other Shortness Of Breath and Swelling    Tree nuts  . Penicillins Other (See Comments)    Unknown childhood allergy Has patient had a PCN reaction causing immediate rash, facial/tongue/throat swelling, SOB or lightheadedness with hypotension: Unknown Has patient had a PCN reaction causing severe rash involving mucus membranes or skin necrosis: Unknown Has patient had a PCN reaction that required hospitalization: Unknown Has patient had a PCN reaction occurring within the last 10 years: Unknown If all of the above answers are "NO", then may proceed with Cephalosporin use.    Follow-up Information    [facility] Follow up.   Specialty:  [place] Why:  Home Health RN for wound  care Contact information: [address] [place] [address] [phone]        Benito Mccreedy, MD. Schedule an appointment as soon as possible for a visit in 2 week(s).   Specialty:  Internal Medicine Contact information: [place] [address] [phone]        Dorothy Spark, MD .   Specialty:  Cardiology Contact information: [place] [address] [phone]        opthalmologist. Schedule an appointment as soon as possible for a visit in 1 week(s).  The results of significant diagnostics from this hospitalization (including imaging, microbiology, ancillary and laboratory) are listed below for reference.    Significant Diagnostic Studies: US Abdomen Limited  Result Date: 09/01/2018 CLINICAL DATA:  Abdominal pain EXAM: ULTRASOUND ABDOMEN LIMITED RIGHT UPPER QUADRANT COMPARISON:  CT 05/30/2018, ultrasound 04/25/2015 FINDINGS: Gallbladder: Possible tumefactive sludge or small polyp in the gallbladder. No shadowing stone. Normal wall thickness. Negative sonographic Murphy. Common bile duct: Diameter: 3.2 mm Liver: Increased hepatic echogenicity without focal abnormality. Portal vein is patent on color Doppler imaging with normal direction of blood flow towards the liver. IMPRESSION: 1. Limited exam secondary to habitus and patient inability to position for the exam 2. Small echogenic focus in the gallbladder, probably represents polyp or small amount of tumefactive sludge. Negative for shadowing stone or biliary dilatation 3. Echogenic liver suggesting steatosis Electronically Signed   By: Donavan Foil M.D.   On: 09/01/2018 19:37   Dg Abd 2 Views  Result Date: 09/02/2018 CLINICAL DATA:  Abdominal pain.  History of Crohn's disease. EXAM: ABDOMEN - 2 VIEW COMPARISON:  CT abdomen and pelvis May 30, 2018 FINDINGS: Supine and upright images obtained. There is no appreciable bowel  dilatation or air-fluid level to suggest bowel obstruction. No free air. Fairly mild degree of stool in colon. Lung bases clear. There is a small apparent phlebolith in the right abdomen. IMPRESSION: No evident bowel obstruction or free air.  Lung bases clear. Electronically Signed   By: Lowella Grip III M.D.   On: 09/02/2018 13:26    Microbiology: No results found for this or any previous visit (from the past 240 hour(s)).   Labs: Basic Metabolic Panel: Recent Labs  Lab 09/02/18 0329 09/03/18 0358 09/04/18 0750  NA 140 139 139  K 3.3* 3.8 4.7  CL 112* 111 110  CO2 22 23 20*  GLUCOSE 96 74 97  BUN <5* <5* <5*  CREATININE 0.61 0.64 0.79  CALCIUM 8.0* 8.0* 7.9*  MG 1.8 1.7 2.1   Liver Function Tests: Recent Labs  Lab 09/02/18 0329  AST 13*  ALT 11  ALKPHOS 79  BILITOT 0.4  PROT 5.0*  ALBUMIN 1.9*   Recent Labs  Lab 09/02/18 0329  LIPASE 22   No results for input(s): AMMONIA in the last 168 hours. CBC: Recent Labs  Lab 09/02/18 0329 09/03/18 0358 09/04/18 0929  WBC 6.0 5.4  --   NEUTROABS 3.4 2.7  --   HGB 9.9* 10.1* 10.5*  HCT 31.6* 32.8* 31.9*  MCV 87.5 87.9  --   PLT 253 218  --    Cardiac Enzymes: No results for input(s): CKTOTAL, CKMB, CKMBINDEX, TROPONINI in the last 168 hours. BNP: BNP (last 3 results) No results for input(s): BNP in the last 8760 hours.  ProBNP (last 3 results) No results for input(s): PROBNP in the last 8760 hours.  CBG: No results for input(s): GLUCAP in the last 168 hours.     Signed:  Irine Seal MD.  [facility] 09/07/2018, 3:13 PM

## 2018-09-07 NOTE — Progress Notes (Signed)
PT Cancellation Note  Patient Details Name: Dominique [name] MRN: [id number] DOB: 1980-12-24   Cancelled Treatment:    Reason Eval/Treat Not Completed: Medical issues which prohibited therapy (pt is currently having nausea and vomiting (RN notified), she reports new wound under L breast today (RN aware). Will follow. )  Philomena Doheny PT 09/07/2018  Acute Rehabilitation Services Pager [phone] Office [phone]

## 2018-09-07 NOTE — Progress Notes (Signed)
Nutrition Follow-up  DOCUMENTATION CODES:   Morbid obesity  INTERVENTION:   Continue supplements (Ensure, Juven) ordered once N/V is more controlled.  NUTRITION DIAGNOSIS:   Increased nutrient needs related to wound healing as evidenced by estimated needs.  Ongoing.  GOAL:   Patient will meet greater than or equal to 90% of their needs  Not meeting, N/V  MONITOR:   PO intake, Supplement acceptance, Labs, Weight trends, Skin, I & O's  ASSESSMENT:   37 year old female with history of morbid obesity, chronic intertrigo complicated by bacterial infection and poor healing, Crohn's disease status post colectomy with diverting ostomy, pulmonary hypertension was transferred back from [facility] after eye surgery on 08/29/2018.  She had been hospitalized since 07/04/2018: On 08/24/2018, corneal perforation was noted and patient was transferred to [facility] where she received keratoplasty on 08/27/2018 and was transferred back to [facility] the following day.    Patient was eating well, ~100% of meals on 12/14. Today has started having N/V. Pt has not been accepting supplements throughout admission. This is consistent with the previous admission. If N/V is controlled and pt is still not trying supplements, will d/c.  Pt's weight is stable.  Medications: Multivitamin with minerals daily, Iv Zofran PRN Labs reviewed: Mg WNL   Diet Order:   Diet Order            Diet regular Room service appropriate? Yes; Fluid consistency: Thin  Diet effective now              EDUCATION NEEDS:   No education needs have been identified at this time  Skin:  Skin Assessment: Skin Integrity Issues: Skin Integrity Issues:: Other (Comment) Other: per flowsheet, non-pressure wounds to: abdomen, upper and lower abdomen, L breast, bilateral groins, sacrum, and bilateral lower back  Last BM:  12/15  Height:   Ht Readings from Last 1 Encounters:  08/28/18 5' 2"  (1.575 m)    Weight:   Wt  Readings from Last 1 Encounters:  09/07/18 134.3 kg    Ideal Body Weight:  50 kg  BMI:  Body mass index is 54.15 kg/m.  Estimated Nutritional Needs:   Kcal:  2100-2300  Protein:  80-90g  Fluid:  2.1L/day  Clayton Bibles, MS, RD, LDN [place] [facility] Pager: [phone] After Hours Pager: [phone]

## 2018-09-15 ENCOUNTER — Encounter (HOSPITAL_COMMUNITY): Payer: Self-pay

## 2018-09-15 ENCOUNTER — Inpatient Hospital Stay (HOSPITAL_COMMUNITY)
Admission: EM | Admit: 2018-09-15 | Discharge: 2018-10-24 | DRG: 438 | Disposition: E | Payer: Medicaid Other | Attending: Internal Medicine | Admitting: Internal Medicine

## 2018-09-15 ENCOUNTER — Other Ambulatory Visit: Payer: Self-pay

## 2018-09-15 ENCOUNTER — Emergency Department (HOSPITAL_COMMUNITY): Payer: Medicaid Other

## 2018-09-15 DIAGNOSIS — Z8249 Family history of ischemic heart disease and other diseases of the circulatory system: Secondary | ICD-10-CM

## 2018-09-15 DIAGNOSIS — K650 Generalized (acute) peritonitis: Secondary | ICD-10-CM

## 2018-09-15 DIAGNOSIS — E876 Hypokalemia: Secondary | ICD-10-CM | POA: Diagnosis present

## 2018-09-15 DIAGNOSIS — L304 Erythema intertrigo: Secondary | ICD-10-CM

## 2018-09-15 DIAGNOSIS — F329 Major depressive disorder, single episode, unspecified: Secondary | ICD-10-CM | POA: Diagnosis present

## 2018-09-15 DIAGNOSIS — N179 Acute kidney failure, unspecified: Secondary | ICD-10-CM | POA: Diagnosis present

## 2018-09-15 DIAGNOSIS — E872 Acidosis, unspecified: Secondary | ICD-10-CM

## 2018-09-15 DIAGNOSIS — I509 Heart failure, unspecified: Secondary | ICD-10-CM | POA: Diagnosis present

## 2018-09-15 DIAGNOSIS — Z9989 Dependence on other enabling machines and devices: Secondary | ICD-10-CM

## 2018-09-15 DIAGNOSIS — K859 Acute pancreatitis without necrosis or infection, unspecified: Principal | ICD-10-CM | POA: Diagnosis present

## 2018-09-15 DIAGNOSIS — R1110 Vomiting, unspecified: Secondary | ICD-10-CM

## 2018-09-15 DIAGNOSIS — E860 Dehydration: Secondary | ICD-10-CM

## 2018-09-15 DIAGNOSIS — D72829 Elevated white blood cell count, unspecified: Secondary | ICD-10-CM

## 2018-09-15 DIAGNOSIS — A419 Sepsis, unspecified organism: Secondary | ICD-10-CM

## 2018-09-15 DIAGNOSIS — T39395A Adverse effect of other nonsteroidal anti-inflammatory drugs [NSAID], initial encounter: Secondary | ICD-10-CM | POA: Diagnosis present

## 2018-09-15 DIAGNOSIS — G319 Degenerative disease of nervous system, unspecified: Secondary | ICD-10-CM | POA: Diagnosis present

## 2018-09-15 DIAGNOSIS — R6510 Systemic inflammatory response syndrome (SIRS) of non-infectious origin without acute organ dysfunction: Secondary | ICD-10-CM

## 2018-09-15 DIAGNOSIS — R42 Dizziness and giddiness: Secondary | ICD-10-CM

## 2018-09-15 DIAGNOSIS — I9589 Other hypotension: Secondary | ICD-10-CM | POA: Diagnosis present

## 2018-09-15 DIAGNOSIS — R7989 Other specified abnormal findings of blood chemistry: Secondary | ICD-10-CM

## 2018-09-15 DIAGNOSIS — Z452 Encounter for adjustment and management of vascular access device: Secondary | ICD-10-CM

## 2018-09-15 DIAGNOSIS — Z933 Colostomy status: Secondary | ICD-10-CM

## 2018-09-15 DIAGNOSIS — F17210 Nicotine dependence, cigarettes, uncomplicated: Secondary | ICD-10-CM | POA: Diagnosis present

## 2018-09-15 DIAGNOSIS — G8929 Other chronic pain: Secondary | ICD-10-CM

## 2018-09-15 DIAGNOSIS — J9600 Acute respiratory failure, unspecified whether with hypoxia or hypercapnia: Secondary | ICD-10-CM

## 2018-09-15 DIAGNOSIS — I2510 Atherosclerotic heart disease of native coronary artery without angina pectoris: Secondary | ICD-10-CM | POA: Diagnosis present

## 2018-09-15 DIAGNOSIS — H547 Unspecified visual loss: Secondary | ICD-10-CM | POA: Diagnosis not present

## 2018-09-15 DIAGNOSIS — G629 Polyneuropathy, unspecified: Secondary | ICD-10-CM | POA: Diagnosis present

## 2018-09-15 DIAGNOSIS — E871 Hypo-osmolality and hyponatremia: Secondary | ICD-10-CM | POA: Diagnosis not present

## 2018-09-15 DIAGNOSIS — J811 Chronic pulmonary edema: Secondary | ICD-10-CM

## 2018-09-15 DIAGNOSIS — R188 Other ascites: Secondary | ICD-10-CM | POA: Diagnosis present

## 2018-09-15 DIAGNOSIS — Z433 Encounter for attention to colostomy: Secondary | ICD-10-CM

## 2018-09-15 DIAGNOSIS — K862 Cyst of pancreas: Secondary | ICD-10-CM | POA: Diagnosis present

## 2018-09-15 DIAGNOSIS — T508X5A Adverse effect of diagnostic agents, initial encounter: Secondary | ICD-10-CM | POA: Diagnosis not present

## 2018-09-15 DIAGNOSIS — K3184 Gastroparesis: Secondary | ICD-10-CM | POA: Diagnosis present

## 2018-09-15 DIAGNOSIS — R627 Adult failure to thrive: Secondary | ICD-10-CM

## 2018-09-15 DIAGNOSIS — D649 Anemia, unspecified: Secondary | ICD-10-CM | POA: Diagnosis present

## 2018-09-15 DIAGNOSIS — I130 Hypertensive heart and chronic kidney disease with heart failure and stage 1 through stage 4 chronic kidney disease, or unspecified chronic kidney disease: Secondary | ICD-10-CM | POA: Diagnosis present

## 2018-09-15 DIAGNOSIS — L089 Local infection of the skin and subcutaneous tissue, unspecified: Secondary | ICD-10-CM | POA: Diagnosis present

## 2018-09-15 DIAGNOSIS — Z7952 Long term (current) use of systemic steroids: Secondary | ICD-10-CM

## 2018-09-15 DIAGNOSIS — Z79899 Other long term (current) drug therapy: Secondary | ICD-10-CM

## 2018-09-15 DIAGNOSIS — Z66 Do not resuscitate: Secondary | ICD-10-CM | POA: Diagnosis not present

## 2018-09-15 DIAGNOSIS — N170 Acute kidney failure with tubular necrosis: Secondary | ICD-10-CM | POA: Diagnosis not present

## 2018-09-15 DIAGNOSIS — Z9119 Patient's noncompliance with other medical treatment and regimen: Secondary | ICD-10-CM

## 2018-09-15 DIAGNOSIS — H18602 Keratoconus, unspecified, left eye: Secondary | ICD-10-CM | POA: Diagnosis present

## 2018-09-15 DIAGNOSIS — J45909 Unspecified asthma, uncomplicated: Secondary | ICD-10-CM | POA: Diagnosis present

## 2018-09-15 DIAGNOSIS — Z515 Encounter for palliative care: Secondary | ICD-10-CM

## 2018-09-15 DIAGNOSIS — I951 Orthostatic hypotension: Secondary | ICD-10-CM | POA: Diagnosis present

## 2018-09-15 DIAGNOSIS — R32 Unspecified urinary incontinence: Secondary | ICD-10-CM | POA: Diagnosis present

## 2018-09-15 DIAGNOSIS — K50118 Crohn's disease of large intestine with other complication: Secondary | ICD-10-CM | POA: Diagnosis present

## 2018-09-15 DIAGNOSIS — Z91018 Allergy to other foods: Secondary | ICD-10-CM

## 2018-09-15 DIAGNOSIS — K5010 Crohn's disease of large intestine without complications: Secondary | ICD-10-CM | POA: Diagnosis present

## 2018-09-15 DIAGNOSIS — D573 Sickle-cell trait: Secondary | ICD-10-CM | POA: Diagnosis present

## 2018-09-15 DIAGNOSIS — Z9049 Acquired absence of other specified parts of digestive tract: Secondary | ICD-10-CM

## 2018-09-15 DIAGNOSIS — G4733 Obstructive sleep apnea (adult) (pediatric): Secondary | ICD-10-CM

## 2018-09-15 DIAGNOSIS — Z825 Family history of asthma and other chronic lower respiratory diseases: Secondary | ICD-10-CM

## 2018-09-15 DIAGNOSIS — R52 Pain, unspecified: Secondary | ICD-10-CM

## 2018-09-15 DIAGNOSIS — N182 Chronic kidney disease, stage 2 (mild): Secondary | ICD-10-CM | POA: Diagnosis present

## 2018-09-15 DIAGNOSIS — R6521 Severe sepsis with septic shock: Secondary | ICD-10-CM | POA: Diagnosis not present

## 2018-09-15 DIAGNOSIS — R571 Hypovolemic shock: Secondary | ICD-10-CM

## 2018-09-15 DIAGNOSIS — R799 Abnormal finding of blood chemistry, unspecified: Secondary | ICD-10-CM

## 2018-09-15 DIAGNOSIS — Z880 Allergy status to penicillin: Secondary | ICD-10-CM

## 2018-09-15 DIAGNOSIS — Z6841 Body Mass Index (BMI) 40.0 and over, adult: Secondary | ICD-10-CM

## 2018-09-15 DIAGNOSIS — R0602 Shortness of breath: Secondary | ICD-10-CM

## 2018-09-15 DIAGNOSIS — I498 Other specified cardiac arrhythmias: Secondary | ICD-10-CM | POA: Diagnosis present

## 2018-09-15 DIAGNOSIS — I272 Pulmonary hypertension, unspecified: Secondary | ICD-10-CM | POA: Diagnosis present

## 2018-09-15 LAB — TROPONIN I: Troponin I: <0.03 ng/mL (ref <0.03)

## 2018-09-15 LAB — CBC
HCT: 40.5 % (ref 36.0–46.0)
Hemoglobin: 13.4 g/dL (ref 12.0–15.0)
MCH: 27.5 pg (ref 26.0–34.0)
MCHC: 33.1 g/dL (ref 30.0–36.0)
MCV: 83.2 fL (ref 80.0–100.0)
Platelets: 289 10*3/uL (ref 150–400)
RBC: 4.87 MIL/uL (ref 3.87–5.11)
RDW: 16.3 % — AB (ref 11.5–15.5)
WBC: 11.7 10*3/uL — ABNORMAL HIGH (ref 4.0–10.5)
nRBC: 0 % (ref 0.0–0.2)

## 2018-09-15 LAB — COMPREHENSIVE METABOLIC PANEL
ALT: 16 U/L (ref 0–44)
AST: 24 U/L (ref 15–41)
Albumin: 1.8 g/dL — ABNORMAL LOW (ref 3.5–5.0)
Alkaline Phosphatase: 134 U/L — ABNORMAL HIGH (ref 38–126)
Anion gap: 9 (ref 5–15)
BUN: 12 mg/dL (ref 6–20)
CO2: 20 mmol/L — AB (ref 22–32)
Calcium: 8.2 mg/dL — ABNORMAL LOW (ref 8.9–10.3)
Chloride: 106 mmol/L (ref 98–111)
Creatinine, Ser: 1.11 mg/dL — ABNORMAL HIGH (ref 0.44–1.00)
GFR calc Af Amer: >60 mL/min (ref >60)
GFR calc non Af Amer: >60 mL/min (ref >60)
Glucose, Bld: 103 mg/dL — ABNORMAL HIGH (ref 70–99)
Potassium: 4.3 mmol/L (ref 3.5–5.1)
SODIUM: 135 mmol/L (ref 135–145)
Total Bilirubin: 1 mg/dL (ref 0.3–1.2)
Total Protein: 5.9 g/dL — ABNORMAL LOW (ref 6.5–8.1)

## 2018-09-15 LAB — LACTIC ACID, PLASMA
LACTIC ACID, VENOUS: 2.3 mmol/L — AB (ref 0.5–1.9)
Lactic Acid, Venous: 1.4 mmol/L (ref 0.5–1.9)

## 2018-09-15 LAB — LIPASE, BLOOD: Lipase: 26 U/L (ref 11–51)

## 2018-09-15 LAB — HCG, QUANTITATIVE, PREGNANCY: hCG, Beta Chain, Quant, S: <1 m[IU]/mL (ref <5)

## 2018-09-15 MED ORDER — PANTOPRAZOLE SODIUM 40 MG PO TBEC
40.0000 mg | DELAYED_RELEASE_TABLET | Freq: Two times a day (BID) | ORAL | Status: DC
  Administered 2018-09-16 – 2018-09-26 (×20): 40 mg via ORAL
  Filled 2018-09-15 (×22): qty 1

## 2018-09-15 MED ORDER — IOPAMIDOL (ISOVUE-300) INJECTION 61%
100.0000 mL | Freq: Once | INTRAVENOUS | Status: AC | PRN
  Administered 2018-09-15: 100 mL via INTRAVENOUS

## 2018-09-15 MED ORDER — FERROUS GLUCONATE 324 (38 FE) MG PO TABS
324.0000 mg | ORAL_TABLET | Freq: Two times a day (BID) | ORAL | Status: DC
  Administered 2018-09-16 – 2018-09-26 (×15): 324 mg via ORAL
  Filled 2018-09-15 (×22): qty 1

## 2018-09-15 MED ORDER — HYDROMORPHONE HCL 1 MG/ML IJ SOLN
1.0000 mg | Freq: Once | INTRAMUSCULAR | Status: AC
  Administered 2018-09-15: 1 mg via INTRAVENOUS
  Filled 2018-09-15: qty 1

## 2018-09-15 MED ORDER — SODIUM CHLORIDE 0.9 % IV BOLUS
1000.0000 mL | Freq: Once | INTRAVENOUS | Status: AC
  Administered 2018-09-15: 1000 mL via INTRAVENOUS

## 2018-09-15 MED ORDER — PREDNISOLONE ACETATE 1 % OP SUSP
1.0000 [drp] | Freq: Four times a day (QID) | OPHTHALMIC | Status: DC
  Administered 2018-09-16 – 2018-09-29 (×52): 1 [drp] via OPHTHALMIC
  Filled 2018-09-15 (×2): qty 5

## 2018-09-15 MED ORDER — OXYCODONE HCL 5 MG PO TABS
5.0000 mg | ORAL_TABLET | Freq: Once | ORAL | Status: AC
  Administered 2018-09-15: 5 mg via ORAL
  Filled 2018-09-15: qty 1

## 2018-09-15 MED ORDER — ONDANSETRON HCL 4 MG/2ML IJ SOLN
4.0000 mg | Freq: Four times a day (QID) | INTRAMUSCULAR | Status: DC | PRN
  Administered 2018-09-16 – 2018-09-27 (×8): 4 mg via INTRAVENOUS
  Filled 2018-09-15 (×8): qty 2

## 2018-09-15 MED ORDER — HYDROMORPHONE HCL 1 MG/ML IJ SOLN: 0.5000 mg | INTRAMUSCULAR | Status: DC | PRN

## 2018-09-15 MED ORDER — HYDROMORPHONE HCL 1 MG/ML IJ SOLN
0.5000 mg | Freq: Once | INTRAMUSCULAR | Status: AC
  Administered 2018-09-15: 0.5 mg via INTRAVENOUS
  Filled 2018-09-15: qty 0.5

## 2018-09-15 MED ORDER — ONDANSETRON HCL 4 MG PO TABS
4.0000 mg | ORAL_TABLET | Freq: Four times a day (QID) | ORAL | Status: DC | PRN
  Administered 2018-09-19: 4 mg via ORAL
  Filled 2018-09-15: qty 1

## 2018-09-15 MED ORDER — MIDODRINE HCL 5 MG PO TABS
5.0000 mg | ORAL_TABLET | Freq: Three times a day (TID) | ORAL | Status: DC
  Administered 2018-09-16 – 2018-09-17 (×6): 5 mg via ORAL
  Filled 2018-09-15 (×7): qty 1

## 2018-09-15 MED ORDER — FAMOTIDINE 20 MG PO TABS
20.0000 mg | ORAL_TABLET | Freq: Two times a day (BID) | ORAL | Status: DC
  Administered 2018-09-15 – 2018-09-26 (×21): 20 mg via ORAL
  Filled 2018-09-15 (×21): qty 1

## 2018-09-15 MED ORDER — ACETAMINOPHEN 325 MG PO TABS
650.0000 mg | ORAL_TABLET | Freq: Four times a day (QID) | ORAL | Status: DC | PRN
  Filled 2018-09-15: qty 2

## 2018-09-15 MED ORDER — SODIUM CHLORIDE 0.9 % IV SOLN
INTRAVENOUS | Status: AC
  Administered 2018-09-15 – 2018-09-16 (×3): via INTRAVENOUS

## 2018-09-15 MED ORDER — METHOCARBAMOL 500 MG PO TABS: 500.0000 mg | ORAL_TABLET | Freq: Four times a day (QID) | ORAL | Status: DC | PRN

## 2018-09-15 MED ORDER — ACETAMINOPHEN 650 MG RE SUPP: 650.0000 mg | Freq: Four times a day (QID) | RECTAL | Status: DC | PRN

## 2018-09-15 MED ORDER — HYDROMORPHONE HCL 1 MG/ML IJ SOLN
1.0000 mg | INTRAMUSCULAR | Status: DC | PRN
  Administered 2018-09-15 – 2018-09-17 (×11): 1 mg via INTRAVENOUS
  Filled 2018-09-15 (×11): qty 1

## 2018-09-15 MED ORDER — HYDROMORPHONE BOLUS VIA INFUSION: 0.5000 mg | Freq: Once | INTRAVENOUS | Status: DC

## 2018-09-15 MED ORDER — SODIUM CHLORIDE (PF) 0.9 % IJ SOLN
INTRAMUSCULAR | Status: AC
  Filled 2018-09-15: qty 50

## 2018-09-15 MED ORDER — IOPAMIDOL (ISOVUE-300) INJECTION 61%
INTRAVENOUS | Status: AC
  Filled 2018-09-15: qty 100

## 2018-09-15 NOTE — H&P (Signed)
History and Physical    Eyvonne [name] VWU:[id number] DOB: [birth year] DOA: 09/06/2018  PCP: Benito Mccreedy, MD  Patient coming from: Home.  Chief Complaint: Abdominal pain.  HPI: Dominique [name] is a 37 y.o. female with history of Crohn's disease status post colectomy with diverting ostomy was recently admitted for extended stay after patient was found to have hypotension abdominal wounds and possible POTS was placed on midodrine and also with abdominal pain patient was placed on PPI and Pepcid had keratoplasty of the left cornea at [facility] and was transferred back to the hospital and discharge 10 days ago presents back to the ER because of increasing weakness over the last 24 hours feeling dizzy on standing and also having increasing abdominal pain mostly in the epigastric and left upper quadrant.  Denies nausea vomiting or diarrhea.  Has not lost consciousness or did not have any chest pain or shortness of breath.  ED Course: In the ER patient was found to be mildly hypotensive with elevated lactate and leukocytosis.  CAT scan of the abdomen shows possibility of pancreatitis.  Labs show leukocytosis.  Creatinine has worsened from 0.7-1.1.  Patient was given fluid bolus and pain relief medication admitted for further management of hypotension abdominal pain likely from pancreatitis.  Generalized weakness could be from hypotension and dehydration.  Review of Systems: As per HPI, rest all negative.   Past Medical History:  Diagnosis Date  . Acute acalculous cholecystitis 11/16/2014  . Anal fistula   . Anemia   . Anxiety   . Asthma   . CHF (congestive heart failure) ([place])   . Complication of anesthesia    states she had a problem staying awake after her c-section, was transferred from [facility] to [facility]  . Coronary artery disease   . Diverticulitis of colon 11/17/2014  . Headache    used to have migraines  . Hepatic steatosis 11/17/2014  . Herpes   . Hypertension   . Morbid obesity ([place])  03/22/2009   Qualifier: Diagnosis of  By: Ronnald Ramp CMA, [place]    . Neuropathy   . OSA (obstructive sleep apnea) 05/02/2014  . Seasonal allergies 06/13/2014  . Sickle cell anemia ([place])    She states she " has the trait" (07/20/15)  . Sleep apnea    uses Bipap  . Thyromegaly 05/02/2014    Past Surgical History:  Procedure Laterality Date  . c section 2011  2011  . COLONOSCOPY WITH PROPOFOL N/A 12/27/2015   Procedure: COLONOSCOPY WITH PROPOFOL;  Surgeon: Wonda Horner, MD;  Location: [facility] ENDOSCOPY;  Service: Endoscopy;  Laterality: N/A;  . COLOSTOMY N/A 06/11/2015   Procedure:  CREATION OF COLOSTOMY;  Surgeon: Georganna Skeans, MD;  Location: [place];  Service: General;  Laterality: N/A;  . ESOPHAGOGASTRODUODENOSCOPY N/A 07/19/2015   Procedure: ESOPHAGOGASTRODUODENOSCOPY (EGD);  Surgeon: Clarene Essex, MD;  Location: [facility] ENDOSCOPY;  Service: Endoscopy;  Laterality: N/A;  . FLEXIBLE SIGMOIDOSCOPY N/A 06/05/2015   Procedure: FLEXIBLE SIGMOIDOSCOPY;  Surgeon: Ronald Lobo, MD;  Location: [facility] ENDOSCOPY;  Service: Endoscopy;  Laterality: N/A;  . LAPAROTOMY N/A 06/09/2015   Procedure: EXPLORATORY LAPAROTOMY, DRAINAGE OF INTRAABDOMINAL ABSCESS, PARTIAL COLON RESECTION,  APPLICATION OF WOUND VAC;  Surgeon: Georganna Skeans, MD;  Location: [place];  Service: General;  Laterality: N/A;  . LAPAROTOMY N/A 06/11/2015   Procedure: RE-EXPLORATION OF ABDOMEN;  Surgeon: Georganna Skeans, MD;  Location: [place];  Service: General;  Laterality: N/A;     reports that she has been smoking cigarettes. She started smoking about  4 years ago. She has a 2.00 pack-year smoking history. She has never used smokeless tobacco. She reports that she does not drink alcohol or use drugs.  Allergies  Allergen Reactions  . Other Shortness Of Breath and Swelling    Tree nuts  . Penicillins Other (See Comments)    Unknown childhood allergy Has patient had a PCN reaction causing immediate rash, facial/tongue/throat swelling, SOB or lightheadedness  with hypotension: Unknown Has patient had a PCN reaction causing severe rash involving mucus membranes or skin necrosis: Unknown Has patient had a PCN reaction that required hospitalization: Unknown Has patient had a PCN reaction occurring within the last 10 years: Unknown If all of the above answers are "NO", then may proceed with Cephalosporin use.     Family History  Problem Relation Age of Onset  . Hypertension Mother   . Allergies Mother   . Hypertension Father   . Cancer Paternal Grandmother        lung  . Hypertension Sister   . Asthma Brother   . Diabetes Maternal Grandmother   . Hypertension Maternal Grandmother   . Hypertension Maternal Grandfather   . Emphysema Paternal Grandfather     Prior to Admission medications   Medication Sig Start Date End Date Taking? Authorizing Provider  acetaminophen (TYLENOL) 500 MG tablet Take 500 mg by mouth every 6 (six) hours as needed for mild pain, moderate pain or headache.   Yes [provider]  ferrous gluconate (FERGON) 324 MG tablet Take 1 tablet (324 mg total) by mouth 2 (two) times daily with a meal. 07/31/18  Yes Charlynne Cousins, MD  methocarbamol (ROBAXIN) 500 MG tablet Take 1 tablet (500 mg total) by mouth every 6 (six) hours as needed for muscle spasms. 09/07/18  Yes Eugenie Filler, MD  oxyCODONE (ROXICODONE) 15 MG immediate release tablet Take 1 tablet (15 mg total) by mouth every 4 (four) hours as needed for moderate pain. 09/07/18  Yes Eugenie Filler, MD  prednisoLONE acetate (PRED FORTE) 1 % ophthalmic suspension Place 1 drop into the left eye every 6 (six) hours for 10 days. 09/07/18 09/17/18 Yes Eugenie Filler, MD  tiZANidine (ZANAFLEX) 4 MG tablet Take 4 mg by mouth every 8 (eight) hours as needed for muscle spasms.   Yes [provider]  albuterol (PROVENTIL) (2.5 MG/3ML) 0.083% nebulizer solution Take 3 mLs (2.5 mg total) by nebulization 2 (two) times daily as needed for  wheezing. Patient not taking: Reported on 09/16/2018 06/03/18   Kayleen Memos, DO  Amino Acids-Protein Hydrolys (FEEDING SUPPLEMENT, PRO-STAT SUGAR FREE 64,) LIQD Take 30 mLs by mouth daily. Patient not taking: Reported on 09/14/2018 07/09/18   Debbe Odea, MD  bisacodyl (DULCOLAX) 5 MG EC tablet Take 1 tablet (5 mg total) by mouth daily as needed for moderate constipation. Patient not taking: Reported on 09/10/2018 07/09/18   Debbe Odea, MD  famotidine (PEPCID) 20 MG tablet Take 1 tablet (20 mg total) by mouth 2 (two) times daily. 09/07/18   Eugenie Filler, MD  feeding supplement, ENSURE ENLIVE, (ENSURE ENLIVE) LIQD Take 237 mLs by mouth daily. Patient not taking: Reported on 08/27/2018 07/10/18   Debbe Odea, MD  metoprolol tartrate (LOPRESSOR) 25 MG tablet Take 0.5 tablets (12.5 mg total) by mouth 2 (two) times daily. 09/07/18   Eugenie Filler, MD  midodrine (PROAMATINE) 5 MG tablet Take 1 tablet (5 mg total) by mouth 3 (three) times daily with meals. 09/07/18   Eugenie Filler,  MD  Multiple Vitamin (MULTIVITAMIN WITH MINERALS) TABS tablet Take 1 tablet by mouth daily. Patient not taking: Reported on 09/09/2018 07/10/18   Debbe Odea, MD  nutrition supplement, JUVEN, (JUVEN) PACK Take 1 packet by mouth 2 (two) times daily between meals. Patient not taking: Reported on 09/22/2018 07/09/18   Debbe Odea, MD  pantoprazole (PROTONIX) 40 MG tablet Take 1 tablet (40 mg total) by mouth 2 (two) times daily before a meal. 09/07/18   Eugenie Filler, MD  polyethylene glycol [facility] / Floria Raveling) packet Take 17 g by mouth daily. Patient not taking: Reported on 09/22/2018 09/07/18   Eugenie Filler, MD  senna-docusate (SENOKOT-S) 8.6-50 MG tablet Take 1 tablet by mouth 2 (two) times daily. Patient not taking: Reported on 09/13/2018 09/07/18   Eugenie Filler, MD    Physical Exam: Vitals:   09/21/2018 1830 09/04/2018 1900 09/05/2018 2041 09/14/2018 2134  BP: (!) 101/49 (!) 104/55  (!) 104/45 (!) 91/52  Pulse: (!) 59 (!) 52 71 68  Resp: 14 15 12 20   Temp:    97.7 F (36.5 C)  TempSrc:    Oral  SpO2: 100% 99% 100% 100%      Constitutional: Moderately built and nourished. Vitals:   09/08/2018 1830 09/12/2018 1900 09/22/2018 2041 09/19/2018 2134  BP: (!) 101/49 (!) 104/55 (!) 104/45 (!) 91/52  Pulse: (!) 59 (!) 52 71 68  Resp: 14 15 12 20   Temp:    97.7 F (36.5 C)  TempSrc:    Oral  SpO2: 100% 99% 100% 100%   Eyes: Anicteric no pallor.  Congested eyes left eye as a dressing. ENMT: No discharge from the ears eyes nose or mouth. Neck: No mass felt.  No neck rigidity. Respiratory: No rhonchi or crepitations. Cardiovascular: S1-S2 heard. Abdomen: Soft nontender multiple wound seen. Musculoskeletal: No edema. Skin: Multiple skin wounds. Neurologic: Alert awake oriented to time place and person.  Moves all extremities. Psychiatric: Appears normal.   Labs on Admission: I have personally reviewed following labs and imaging studies  CBC: Recent Labs  Lab 09/10/2018 1612  WBC 11.7*  HGB 13.4  HCT 40.5  MCV 83.2  PLT 712   Basic Metabolic Panel: Recent Labs  Lab 09/05/2018 1612  NA 135  K 4.3  CL 106  CO2 20*  GLUCOSE 103*  BUN 12  CREATININE 1.11*  CALCIUM 8.2*   GFR: Estimated Creatinine Clearance: 90.9 mL/min (A) (by C-G formula based on SCr of 1.11 mg/dL (H)). Liver Function Tests: Recent Labs  Lab 09/14/2018 1612  AST 24  ALT 16  ALKPHOS 134*  BILITOT 1.0  PROT 5.9*  ALBUMIN 1.8*   Recent Labs  Lab 08/23/2018 1612  LIPASE 26   No results for input(s): AMMONIA in the last 168 hours. Coagulation Profile: No results for input(s): INR, PROTIME in the last 168 hours. Cardiac Enzymes: Recent Labs  Lab 08/25/2018 1612  TROPONINI <0.03   BNP (last 3 results) No results for input(s): PROBNP in the last 8760 hours. HbA1C: No results for input(s): HGBA1C in the last 72 hours. CBG: No results for input(s): GLUCAP in the last 168  hours. Lipid Profile: No results for input(s): CHOL, HDL, LDLCALC, TRIG, CHOLHDL, LDLDIRECT in the last 72 hours. Thyroid Function Tests: No results for input(s): TSH, T4TOTAL, FREET4, T3FREE, THYROIDAB in the last 72 hours. Anemia Panel: No results for input(s): VITAMINB12, FOLATE, FERRITIN, TIBC, IRON, RETICCTPCT in the last 72 hours. Urine analysis:    Component Value Date/Time  COLORURINE YELLOW 07/05/2018 [place] 07/05/2018 0511   LABSPEC 1.020 07/05/2018 0511   PHURINE 5.0 07/05/2018 0511   GLUCOSEU NEGATIVE 07/05/2018 0511   HGBUR NEGATIVE 07/05/2018 0511   BILIRUBINUR NEGATIVE 07/05/2018 0511   KETONESUR 5 (A) 07/05/2018 0511   PROTEINUR 30 (A) 07/05/2018 0511   UROBILINOGEN 0.2 07/22/2015 0405   NITRITE NEGATIVE 07/05/2018 0511   LEUKOCYTESUR NEGATIVE 07/05/2018 0511   Sepsis Labs: @LABRCNTIP (procalcitonin:4,lacticidven:4) )No results found for this or any previous visit (from the past 240 hour(s)).   Radiological Exams on Admission: Ct Abdomen Pelvis W Contrast  Result Date: 09/17/2018 CLINICAL DATA:  Weakness, hypotension, evaluate acute worsening of chronic wounds, ?deep space infection^148m ISOVUE-300 IOPAMIDOL (ISOVUE-300) INJECTION 61%Cannot find appropriate structured reason for exam - see REASON FOR EXAM (FREE TEXT) hypotension, evaluate acute worsening of chronic wounds, ?deep space infection EXAM: CT ABDOMEN AND PELVIS WITH CONTRAST TECHNIQUE: Multidetector CT imaging of the abdomen and pelvis was performed using the standard protocol following bolus administration of intravenous contrast. CONTRAST:  1052mISOVUE-300 IOPAMIDOL (ISOVUE-300) INJECTION 61% COMPARISON:  CT 979 FINDINGS: Lower chest: Lung bases are clear. Hepatobiliary: No focal hepatic lesion. No biliary duct dilatation. Gallbladder is normal. Common bile duct is normal. Pancreas: Within the tail of the pancreas, new fluid collection measuring 2.4 x 1.5 cm (image 23/2). There is mild  peripancreatic fat inflammation in the splenic hilar region. The more distal body and head of the pancreas appear normal. No duct dilatation. Spleen: Normal spleen.  Normal splenic vein. Adrenals/urinary tract: Adrenal glands and kidneys are normal. The ureters and bladder normal. Stomach/Bowel: Stomach, duodenum small-bowel normal. RIGHT lower quadrant ileostomy. Patient status post colectomy. 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 Vascular/Lymphatic: Abdominal aorta is normal caliber. No periportal or retroperitoneal adenopathy. No pelvic adenopathy. Reproductive: Uterus and ovaries normal Other: No deep wound infection identified. Musculoskeletal: No aggressive osseous lesion. IMPRESSION: 1. New small fluid collection in the tail the pancreas is concern for focal pancreatitis of the tail the pancreas. No extra pancreatic organized fluid collection. Minimal peripancreatic fat stranding. 2. 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 3. No evidence of deep wound infection. Electronically Signed   By: StSuzy Bouchard.D.   On: 09/03/2018 19:47   Dg Chest Port 1 View  Result Date: 09/06/2018 CLINICAL DATA:  Dizziness EXAM: PORTABLE CHEST 1 VIEW COMPARISON:  05/30/2018 FINDINGS: Rightward deviation of the trachea in the neck, looking back to prior chest CT from 06/09/2015 there with was nodularity and cystic lesions in the right thyroid lobe causing this deviation. This could be worked up with thyroid ultrasound if clinically warranted. The lungs appear clear. Cardiac and mediastinal margins appear normal. No appreciable pleural effusion. IMPRESSION: 1. Leftward deviation of the trachea related to right thyroid enlargement as shown on prior CT scan from 2016. If clinically warranted this could be worked up dedicated thyroid ultrasound. Otherwise, no significant abnormalities are observed. Electronically Signed   By: WaVan ClinesM.D.   On: 08/26/2018 15:40    EKG: Independently reviewed.  Normal sinus rhythm with old inferior and anterior infarct.  Assessment/Plan Principal Problem:   Acute pancreatitis Active Problems:   Orthostatic hypotension   Crohn's colitis with perforation s/p left colectomy/colostomy 2016   OSA on CPAP   Chronic anemia   ARF (acute renal failure) ([place]   1. Abdominal pain with possible acute pancreatitis -cause not clear.  Will check triglyceride levels.  Denies  drinking alcohol.  No definite gallstones seen.  Follow LFTs and lipase.  Continue with hydration.  Pain relief medications. 2. Weakness likely from hypotension has chronic hypotension and history of POTS.  Does not look septic.  Will continue patient on midodrine and hydration.  Check lactate level. 3. Leukocytosis afebrile at this time.  Chest x-ray unremarkable.  Check UA.  Follow CBC. 4. On beta-blockers not sure what patient is on for may be for palpitations.  Since patient was hypotensive will hold nighttime dose and restart at a lower dose tomorrow morning if patient's blood pressure improves. 5. Acute renal failure likely from hypotension and dehydration we will gently hydrate follow UA follow metabolic panel. 6. Sleep apnea noncompliant with CPAP. 7. Chronic anemia on iron supplements presently hemoglobin is around 13. 8. History of Crohn's status post left colectomy and colostomy placement.  In remission at this time. 9. Chronic abdominal wounds for which wound team has been consulted.   DVT prophylaxis: SCDs for now may change to Lovenox if no acute intervention planned. Code Status: Full code. Family Communication: Discussed with patient. Disposition Plan: Home. Consults called: Wound team. Admission status: Observation.   Rise Patience MD [facility] Pager [phone].  If 7PM-7AM, please contact night-coverage [email or web address] Password [facility]  08/25/2018, 10:28 PM

## 2018-09-15 NOTE — ED Provider Notes (Signed)
[facility] Emergency Department Provider Note MRN:  [id number]  [place] date & time: 09/09/2018     Chief Complaint   Weakness   History of Present Illness   Dominique [name] is a 37 y.o. year-old female with a history of Crohn's, CAD presenting to the ED with chief complaint of dizziness.  Patient feeling very lightheaded this morning, was unable to stand from the toilet, called EMS.  Denies headache or vision change, no chest pain or shortness of breath.  Denies recent fever, no cough.  No abdominal pain, normal colostomy output.  Endorsing increased pain of her chronic perineal wounds.  Symptoms are constant, moderate.  No exacerbating relieving factors.  Review of Systems  A complete 10 system review of systems was obtained and all systems are negative except as noted in the HPI and PMH.   Patient's Health History    Past Medical History:  Diagnosis Date  . Acute acalculous cholecystitis 11/16/2014  . Anal fistula   . Anemia   . Anxiety   . Asthma   . CHF (congestive heart failure) ([place])   . Complication of anesthesia    states she had a problem staying awake after her c-section, was transferred from [facility] to [facility]  . Coronary artery disease   . Diverticulitis of colon 11/17/2014  . Headache    used to have migraines  . Hepatic steatosis 11/17/2014  . Herpes   . Hypertension   . Morbid obesity ([place]) 03/22/2009   Qualifier: Diagnosis of  By: Ronnald Ramp CMA, [place]    . Neuropathy   . OSA (obstructive sleep apnea) 05/02/2014  . Seasonal allergies 06/13/2014  . Sickle cell anemia ([place])    She states she " has the trait" (07/20/15)  . Sleep apnea    uses Bipap  . Thyromegaly 05/02/2014    Past Surgical History:  Procedure Laterality Date  . c section 2011  2011  . COLONOSCOPY WITH PROPOFOL N/A 12/27/2015   Procedure: COLONOSCOPY WITH PROPOFOL;  Surgeon: Wonda Horner, MD;  Location: [facility] ENDOSCOPY;  Service: Endoscopy;  Laterality: N/A;  . COLOSTOMY N/A 06/11/2015     Procedure:  CREATION OF COLOSTOMY;  Surgeon: Georganna Skeans, MD;  Location: [place];  Service: General;  Laterality: N/A;  . ESOPHAGOGASTRODUODENOSCOPY N/A 07/19/2015   Procedure: ESOPHAGOGASTRODUODENOSCOPY (EGD);  Surgeon: Clarene Essex, MD;  Location: [facility] ENDOSCOPY;  Service: Endoscopy;  Laterality: N/A;  . FLEXIBLE SIGMOIDOSCOPY N/A 06/05/2015   Procedure: FLEXIBLE SIGMOIDOSCOPY;  Surgeon: Ronald Lobo, MD;  Location: [facility] ENDOSCOPY;  Service: Endoscopy;  Laterality: N/A;  . LAPAROTOMY N/A 06/09/2015   Procedure: EXPLORATORY LAPAROTOMY, DRAINAGE OF INTRAABDOMINAL ABSCESS, PARTIAL COLON RESECTION,  APPLICATION OF WOUND VAC;  Surgeon: Georganna Skeans, MD;  Location: [place];  Service: General;  Laterality: N/A;  . LAPAROTOMY N/A 06/11/2015   Procedure: RE-EXPLORATION OF ABDOMEN;  Surgeon: Georganna Skeans, MD;  Location: [facility] OR;  Service: General;  Laterality: N/A;    Family History  Problem Relation Age of Onset  . Hypertension Mother   . Allergies Mother   . Hypertension Father   . Cancer Paternal Grandmother        lung  . Hypertension Sister   . Asthma Brother   . Diabetes Maternal Grandmother   . Hypertension Maternal Grandmother   . Hypertension Maternal Grandfather   . Emphysema Paternal Grandfather     Social History   Socioeconomic History  . Marital status: Single    Spouse name: Not on file  . Number  of children: Not on file  . Years of education: Not on file  . Highest education level: Not on file  Occupational History  . Not on file  Social Needs  . Financial resource strain: Not on file  . Food insecurity:    Worry: Not on file    Inability: Not on file  . Transportation needs:    Medical: Not on file    Non-medical: Not on file  Tobacco Use  . Smoking status: Current Some Day Smoker    Packs/day: 0.50    Years: 4.00    Pack years: 2.00    Types: Cigarettes    Start date: 07/24/2014    Last attempt to quit: 05/27/2015    Years since quitting: 3.3  . Smokeless  tobacco: Never Used  Substance and Sexual Activity  . Alcohol use: No    Alcohol/week: 0.0 standard drinks  . Drug use: No  . Sexual activity: Not on file  Lifestyle  . Physical activity:    Days per week: Not on file    Minutes per session: Not on file  . Stress: Not on file  Relationships  . Social connections:    Talks on phone: Not on file    Gets together: Not on file    Attends religious service: Not on file    Active member of club or organization: Not on file    Attends meetings of clubs or organizations: Not on file    Relationship status: Not on file  . Intimate partner violence:    Fear of current or ex partner: Not on file    Emotionally abused: Not on file    Physically abused: Not on file    Forced sexual activity: Not on file  Other Topics Concern  . Not on file  Social History Narrative  . Not on file     Physical Exam  Vital Signs and Nursing Notes reviewed Vitals:   08/28/2018 1900 09/16/2018 2041  BP: (!) 104/55 (!) 104/45  Pulse: (!) 52 71  Resp: 15 12  Temp:    SpO2: 99% 100%    CONSTITUTIONAL: Chronically ill-appearing, NAD NEURO:  Alert and oriented x 3, no focal deficits EYES:  eyes equal and reactive ENT/NECK:  no LAD, no JVD CARDIO: Regular rate, well-perfused, normal S1 and S2 PULM:  CTAB no wheezing or rhonchi GI/GU:  normal bowel sounds, non-distended, non-tender MSK/SPINE:  No gross deformities, no edema SKIN:  no rash, atraumatic; foul-smelling wounds with purulent drainage beneath the pannus PSYCH:  Appropriate speech and behavior  Diagnostic and Interventional Summary    EKG Interpretation  Date/Time:  Tuesday September 15 2018 15:55:43 EST Ventricular Rate:  89 PR Interval:    QRS Duration: 75 QT Interval:  453 QTC Calculation: 552 R Axis:   -47 Text Interpretation:  Sinus rhythm Inferior infarct, old Anterior infarct, old Prolonged QT interval Confirmed by Gerlene Fee [phone]) on 09/03/2018 4:29:27 PM      Labs Reviewed    CBC - Abnormal; Notable for the following components:      Result Value   WBC 11.7 (*)    RDW 16.3 (*)    All other components within normal limits  COMPREHENSIVE METABOLIC PANEL - Abnormal; Notable for the following components:   CO2 20 (*)    Glucose, Bld 103 (*)    Creatinine, Ser 1.11 (*)    Calcium 8.2 (*)    Total Protein 5.9 (*)    Albumin 1.8 (*)  Alkaline Phosphatase 134 (*)    All other components within normal limits  LACTIC ACID, PLASMA - Abnormal; Notable for the following components:   Lactic Acid, Venous 2.3 (*)    All other components within normal limits  LIPASE, BLOOD  TROPONIN I  HCG, QUANTITATIVE, PREGNANCY  URINALYSIS, ROUTINE W REFLEX MICROSCOPIC  LACTIC ACID, PLASMA    CT ABDOMEN PELVIS W CONTRAST  Final Result    DG Chest Port 1 View  Final Result      Medications  iopamidol (ISOVUE-300) 61 % injection (has no administration in time range)  sodium chloride (PF) 0.9 % injection (has no administration in time range)  sodium chloride 0.9 % bolus 1,000 mL (1,000 mLs Intravenous New Bag/Given 09/21/2018 1654)  oxyCODONE (Oxy IR/ROXICODONE) immediate release tablet 5 mg (5 mg Oral Given 08/25/2018 1654)  sodium chloride 0.9 % bolus 1,000 mL (0 mLs Intravenous Stopped 09/12/2018 2040)  iopamidol (ISOVUE-300) 61 % injection 100 mL (100 mLs Intravenous Contrast Given 08/24/2018 1917)  HYDROmorphone (DILAUDID) injection 1 mg (1 mg Intravenous Given 09/07/2018 1842)     Procedures Critical Care  ED Course and Medical Decision Making  I have reviewed the triage vital signs and the nursing notes.  Pertinent labs & imaging results that were available during my care of the patient were reviewed by me and considered in my medical decision making (see below for details).  Hypotension in this 37 year old female with lightheadedness, had a recent admission 2 months ago for dehydration, will provide fluid resuscitation, evaluate for metabolic disarray, systemic  infection, will need CT to further evaluate chronic wounds for acute worsening or deep space infection.  CT reveals unchanged wounds, but does reveal possibility of pancreatitis.  Patient without significant epigastric pain but continues to feel dizzy, continues to have soft blood pressure.  Admitted to hospital service for further care.  Barth Kirks. [place]onia Small, [place] [email or web address]  Final Clinical Impressions(s) / ED Diagnoses     ICD-10-CM   1. Acute pancreatitis, unspecified complication status, unspecified pancreatitis type K85.90   2. Dizzy R42 DG Chest Port 1 View    DG Chest Port 1 View  3. Dehydration E86.0     ED Discharge Orders    None         Maudie Flakes, MD 08/31/2018 2131

## 2018-09-15 NOTE — ED Notes (Signed)
Date and time results received: 09/05/2018 5:30 PM (use smartphrase ".now" to insert current time)  Test: Lactic Critical Value: 2.3  Name of Provider Notified: [place]  Orders Received? Or Actions Taken?: awaiting orders

## 2018-09-15 NOTE — ED Notes (Signed)
Pt has been informed urine collection is needed. States she "cannot go at this time"

## 2018-09-15 NOTE — ED Triage Notes (Signed)
EMS reports from home, called out for weakness, could not get up off toilet. Pt states she has felt like this since last night. Pt states Hx of orthostatic changes.  BP 138/90 HR 58 RR 18 Sp02 99 RA

## 2018-09-15 NOTE — ED Notes (Addendum)
Erroneous vitals in error, pls disregard

## 2018-09-15 NOTE — ED Notes (Signed)
Bed: ZY24 Expected date:  Expected time:  Means of arrival:  Comments: EMS 37yo orthostatic

## 2018-09-16 DIAGNOSIS — K50118 Crohn's disease of large intestine with other complication: Secondary | ICD-10-CM | POA: Diagnosis not present

## 2018-09-16 DIAGNOSIS — R571 Hypovolemic shock: Secondary | ICD-10-CM | POA: Diagnosis present

## 2018-09-16 DIAGNOSIS — K859 Acute pancreatitis without necrosis or infection, unspecified: Secondary | ICD-10-CM | POA: Diagnosis present

## 2018-09-16 DIAGNOSIS — J9601 Acute respiratory failure with hypoxia: Secondary | ICD-10-CM | POA: Diagnosis not present

## 2018-09-16 DIAGNOSIS — I272 Pulmonary hypertension, unspecified: Secondary | ICD-10-CM | POA: Diagnosis present

## 2018-09-16 DIAGNOSIS — D649 Anemia, unspecified: Secondary | ICD-10-CM | POA: Diagnosis not present

## 2018-09-16 DIAGNOSIS — R627 Adult failure to thrive: Secondary | ICD-10-CM | POA: Diagnosis not present

## 2018-09-16 DIAGNOSIS — R6510 Systemic inflammatory response syndrome (SIRS) of non-infectious origin without acute organ dysfunction: Secondary | ICD-10-CM | POA: Diagnosis not present

## 2018-09-16 DIAGNOSIS — E871 Hypo-osmolality and hyponatremia: Secondary | ICD-10-CM | POA: Diagnosis not present

## 2018-09-16 DIAGNOSIS — I498 Other specified cardiac arrhythmias: Secondary | ICD-10-CM | POA: Diagnosis present

## 2018-09-16 DIAGNOSIS — R188 Other ascites: Secondary | ICD-10-CM | POA: Diagnosis present

## 2018-09-16 DIAGNOSIS — E872 Acidosis: Secondary | ICD-10-CM | POA: Diagnosis present

## 2018-09-16 DIAGNOSIS — R1013 Epigastric pain: Secondary | ICD-10-CM | POA: Diagnosis present

## 2018-09-16 DIAGNOSIS — R42 Dizziness and giddiness: Secondary | ICD-10-CM | POA: Diagnosis not present

## 2018-09-16 DIAGNOSIS — Z433 Encounter for attention to colostomy: Secondary | ICD-10-CM | POA: Diagnosis not present

## 2018-09-16 DIAGNOSIS — Z66 Do not resuscitate: Secondary | ICD-10-CM | POA: Diagnosis not present

## 2018-09-16 DIAGNOSIS — J9600 Acute respiratory failure, unspecified whether with hypoxia or hypercapnia: Secondary | ICD-10-CM | POA: Diagnosis not present

## 2018-09-16 DIAGNOSIS — T39395A Adverse effect of other nonsteroidal anti-inflammatory drugs [NSAID], initial encounter: Secondary | ICD-10-CM | POA: Diagnosis present

## 2018-09-16 DIAGNOSIS — R112 Nausea with vomiting, unspecified: Secondary | ICD-10-CM | POA: Diagnosis not present

## 2018-09-16 DIAGNOSIS — R6521 Severe sepsis with septic shock: Secondary | ICD-10-CM | POA: Diagnosis not present

## 2018-09-16 DIAGNOSIS — L304 Erythema intertrigo: Secondary | ICD-10-CM | POA: Diagnosis not present

## 2018-09-16 DIAGNOSIS — G4733 Obstructive sleep apnea (adult) (pediatric): Secondary | ICD-10-CM | POA: Diagnosis present

## 2018-09-16 DIAGNOSIS — I130 Hypertensive heart and chronic kidney disease with heart failure and stage 1 through stage 4 chronic kidney disease, or unspecified chronic kidney disease: Secondary | ICD-10-CM | POA: Diagnosis present

## 2018-09-16 DIAGNOSIS — K862 Cyst of pancreas: Secondary | ICD-10-CM | POA: Diagnosis present

## 2018-09-16 DIAGNOSIS — K5010 Crohn's disease of large intestine without complications: Secondary | ICD-10-CM | POA: Diagnosis present

## 2018-09-16 DIAGNOSIS — Z9989 Dependence on other enabling machines and devices: Secondary | ICD-10-CM | POA: Diagnosis not present

## 2018-09-16 DIAGNOSIS — K8500 Idiopathic acute pancreatitis without necrosis or infection: Secondary | ICD-10-CM | POA: Diagnosis not present

## 2018-09-16 DIAGNOSIS — R799 Abnormal finding of blood chemistry, unspecified: Secondary | ICD-10-CM | POA: Diagnosis not present

## 2018-09-16 DIAGNOSIS — Z6841 Body Mass Index (BMI) 40.0 and over, adult: Secondary | ICD-10-CM | POA: Diagnosis not present

## 2018-09-16 DIAGNOSIS — K650 Generalized (acute) peritonitis: Secondary | ICD-10-CM | POA: Diagnosis present

## 2018-09-16 DIAGNOSIS — A419 Sepsis, unspecified organism: Secondary | ICD-10-CM | POA: Diagnosis not present

## 2018-09-16 DIAGNOSIS — N179 Acute kidney failure, unspecified: Secondary | ICD-10-CM | POA: Diagnosis not present

## 2018-09-16 DIAGNOSIS — N170 Acute kidney failure with tubular necrosis: Secondary | ICD-10-CM | POA: Diagnosis not present

## 2018-09-16 DIAGNOSIS — I9589 Other hypotension: Secondary | ICD-10-CM | POA: Diagnosis present

## 2018-09-16 DIAGNOSIS — I951 Orthostatic hypotension: Secondary | ICD-10-CM | POA: Diagnosis present

## 2018-09-16 DIAGNOSIS — Z515 Encounter for palliative care: Secondary | ICD-10-CM | POA: Diagnosis present

## 2018-09-16 LAB — CBC WITH DIFFERENTIAL/PLATELET
Abs Immature Granulocytes: 0.05 10*3/uL (ref 0.00–0.07)
Basophils Absolute: 0 10*3/uL (ref 0.0–0.1)
Basophils Relative: 0 %
Eosinophils Absolute: 0.3 10*3/uL (ref 0.0–0.5)
Eosinophils Relative: 3 %
HCT: 37.2 % (ref 36.0–46.0)
Hemoglobin: 11.9 g/dL — ABNORMAL LOW (ref 12.0–15.0)
Immature Granulocytes: 1 %
Lymphocytes Relative: 22 %
Lymphs Abs: 2.4 10*3/uL (ref 0.7–4.0)
MCH: 27.2 pg (ref 26.0–34.0)
MCHC: 32 g/dL (ref 30.0–36.0)
MCV: 85.1 fL (ref 80.0–100.0)
Monocytes Absolute: 1.1 10*3/uL — ABNORMAL HIGH (ref 0.1–1.0)
Monocytes Relative: 10 %
Neutro Abs: 6.7 10*3/uL (ref 1.7–7.7)
Neutrophils Relative %: 64 %
Platelets: 193 10*3/uL (ref 150–400)
RBC: 4.37 MIL/uL (ref 3.87–5.11)
RDW: 16.5 % — ABNORMAL HIGH (ref 11.5–15.5)
WBC: 10.5 10*3/uL (ref 4.0–10.5)
nRBC: 0 % (ref 0.0–0.2)

## 2018-09-16 LAB — BASIC METABOLIC PANEL
Anion gap: 8 (ref 5–15)
BUN: 10 mg/dL (ref 6–20)
CO2: 20 mmol/L — AB (ref 22–32)
Calcium: 7.5 mg/dL — ABNORMAL LOW (ref 8.9–10.3)
Chloride: 109 mmol/L (ref 98–111)
Creatinine, Ser: 0.94 mg/dL (ref 0.44–1.00)
GFR calc Af Amer: >60 mL/min (ref >60)
GFR calc non Af Amer: >60 mL/min (ref >60)
Glucose, Bld: 90 mg/dL (ref 70–99)
Potassium: 3.9 mmol/L (ref 3.5–5.1)
Sodium: 137 mmol/L (ref 135–145)

## 2018-09-16 LAB — HEPATIC FUNCTION PANEL
ALK PHOS: 114 U/L (ref 38–126)
ALT: 15 U/L (ref 0–44)
AST: 19 U/L (ref 15–41)
Albumin: 1.6 g/dL — ABNORMAL LOW (ref 3.5–5.0)
BILIRUBIN INDIRECT: 0.7 mg/dL (ref 0.3–0.9)
Bilirubin, Direct: 0.2 mg/dL (ref 0.0–0.2)
Total Bilirubin: 0.9 mg/dL (ref 0.3–1.2)
Total Protein: 5.1 g/dL — ABNORMAL LOW (ref 6.5–8.1)

## 2018-09-16 LAB — LACTIC ACID, PLASMA
Lactic Acid, Venous: 1.8 mmol/L (ref 0.5–1.9)
Lactic Acid, Venous: 1.6 mmol/L (ref 0.5–1.9)

## 2018-09-16 LAB — LIPID PANEL
Cholesterol: 85 mg/dL (ref 0–200)
HDL: 30 mg/dL — ABNORMAL LOW (ref >40)
LDL CALC: 45 mg/dL (ref 0–99)
Total CHOL/HDL Ratio: 2.8 RATIO
Triglycerides: 51 mg/dL (ref <150)
VLDL: 10 mg/dL (ref 0–40)

## 2018-09-16 LAB — LIPASE, BLOOD: Lipase: 24 U/L (ref 11–51)

## 2018-09-16 MED ORDER — SODIUM CHLORIDE 0.9 % IV SOLN
INTRAVENOUS | Status: DC
  Administered 2018-09-17 (×2): via INTRAVENOUS

## 2018-09-16 MED ORDER — METOPROLOL TARTRATE 25 MG PO TABS: 12.5000 mg | ORAL_TABLET | Freq: Two times a day (BID) | ORAL | Status: DC

## 2018-09-16 MED ORDER — HYDROMORPHONE HCL 1 MG/ML IJ SOLN
0.5000 mg | Freq: Once | INTRAMUSCULAR | Status: AC
  Administered 2018-09-16: 0.5 mg via INTRAVENOUS
  Filled 2018-09-16: qty 0.5

## 2018-09-16 MED ORDER — LIP MEDEX EX OINT
TOPICAL_OINTMENT | CUTANEOUS | Status: DC | PRN
  Administered 2018-09-16 – 2018-09-19 (×2): via TOPICAL
  Filled 2018-09-16: qty 7

## 2018-09-16 NOTE — Consult Note (Signed)
[place] Nurse wound consult note Patient well known to our department, seen several times recently by [facility] Nurse team members.  Last seen by this writer approximately one month ago on 08/11/18 for chronic, nonhealing wounds in the intertriginous areas and abdomen. She was seen by surgery last admission (on 08/14/18) by [facility] PA J. Focht. There have been some recommendations for referral to dermatology as an outpatient for these wounds to rule out Hidradenitis Suppurativa, but it is unclear if this has been done.Eye surgery recently performed at [facility]. There seems to be a new diagnosis of POTS on record. 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 Recommendations have been made in the past for in-home assistance.  Reason for Consult: Abdominal wounds Wound type: nfectious, autoimmune Pressure Injury POA: N/A Wound bed:Red, moist Drainage (amount, consistency, odor) Thick clear with odor consistent with poor hygiene Periwound:intact, dry Dressing procedure/placement/frequency:I have provided Nursing with guidance via the orders for mattress replacement, topical wound care to be performed twice daily using silver hydrofiber and for the use of our house antimicrobial wicking textile, InterDry Ag+.   [place] Nurse ostomy consult note Stoma type/location: RUQ ileostomy Stomal assessment/size: 1 and 1/4 inch x 1 and 3/8 inch oval viewed through intact pouch. Peristomal assessment: nNt seen today Treatment options for stomal/peristomal skin: skin barrier ring. Patient cuts aperture too large for stoma, but has been educated on this many times. Output:brown stool Ostomy pouching: 1pc. Flat drainable pouch with skin barrier ring Education provided: None. Patient uses [employer] ostomy supplies but is amenable to using [employer] while in house. Is typically independent in ostomy care but requires  assistance when she is feeling badly. In those instances, Nursing is required to assist in ostomy care.  [place] nursing team will not follow, but will remain available to this patient, the nursing and medical teams.  Please re-consult if needed. Thanks, Maudie Flakes, MSN, RN, [place], Arther Abbott  Pager# [phone]

## 2018-09-16 NOTE — Progress Notes (Signed)
[facility] Progress Note  Patient: Dominique [name] RFF:[id number]   PCP: Benito Mccreedy, MD DOB: Apr 02, 1981   DOA: 09/12/2018   DOS: 09/16/2018   Date of Service: the patient was seen and examined on 09/16/2018  Brief hospital course: Pt. with PMH of Crohn's disease status post colectomy with diverting ostomy ; admitted on 09/21/2018, presented with complaint of abdominal pain, was found to have pancreatitis. Currently further plan is continue current care.  Subjective: still has a lot of abdominal pain and requesting to increase the frequency of abdominal pain  Assessment and Plan: 1. Abdominal pain with possible acute pancreatitis -cause not clear.  Will check triglyceride levels.  Denies drinking alcohol.  No definite gallstones seen.  Follow LFTs and lipase.  Continue with hydration.  Pain relief medications. 2. Weakness likely from hypotension has chronic hypotension and history of POTS.  Does not look septic.  Will continue patient on midodrine and hydration.  Check lactate level. 3. Leukocytosis afebrile at this time.  Chest x-ray unremarkable.  Check UA.  Follow CBC. 4. On beta-blockers not sure what patient is on for may be for palpitations.  Since patient was hypotensive will hold nighttime dose and restart at a lower dose tomorrow morning if patient's blood pressure improves. 5. Acute renal failure likely from hypotension and dehydration we will gently hydrate follow UA follow metabolic panel. 6. Sleep apnea noncompliant with CPAP. 7. Chronic anemia on iron supplements presently hemoglobin is around 13. 8. History of Crohn's status post left colectomy and colostomy placement.  In remission at this time. Chronic abdominal wounds for which wound team has been consulted  Diet: full liquid diet DVT Prophylaxis: subcutaneous Heparin  Advance goals of care discussion: full code  Family Communication: no family was present at bedside, at the time of interview.   Disposition:    Discharge to be determined .  Consultants: none Procedures: noen  Scheduled Meds: . famotidine  20 mg Oral BID  . ferrous gluconate  324 mg Oral BID WC  . midodrine  5 mg Oral TID WC  . pantoprazole  40 mg Oral BID AC  . prednisoLONE acetate  1 drop Left Eye Q6H   Continuous Infusions: . sodium chloride 100 mL/hr at 09/16/18 0943   PRN Meds: acetaminophen **OR** acetaminophen, HYDROmorphone (DILAUDID) injection, lip balm, methocarbamol, ondansetron **OR** ondansetron (ZOFRAN) IV Antibiotics: Anti-infectives (From admission, onward)   None       Objective: Physical Exam: Vitals:   08/30/2018 2041 09/16/2018 2134 09/16/18 0549 09/16/18 1320  BP: (!) 104/45 (!) 91/52 (!) 95/54 112/61  Pulse: 71 68 84 79  Resp: 12 20 18 18   Temp:  97.7 F (36.5 C) 98.3 F (36.8 C) 98.5 F (36.9 C)  TempSrc:  Oral Oral Oral  SpO2: 100% 100% 90% 94%  Weight:   132 kg     Intake/Output Summary (Last 24 hours) at 09/16/2018 1555 Last data filed at 09/16/2018 3570 Gross per 24 hour  Intake 655.9 ml  Output -  Net 655.9 ml   Filed Weights   09/16/18 0549  Weight: 132 kg   General: Alert, Awake and Oriented to Time, Place and Person. Appear in moderate distress, affect appropriate Eyes: PERRL, Conjunctiva normal ENT: Oral Mucosa clear moist. Neck: no JVD, no Abnormal Mass Or lumps Cardiovascular: S1 and S2 Present, no Murmur, Peripheral Pulses Present Respiratory: normal respiratory effort, Bilateral Air entry equal and Decreased, no use of accessory muscle, Clear to Auscultation, no Crackles, no wheezes Abdomen: Bowel Sound  present, Soft and diffuse tenderness, no hernia Skin: no redness, no Rash, no induration Extremities: no Pedal edema, no calf tenderness Neurologic: Grossly no focal neuro deficit. Bilaterally Equal motor strength  Data Reviewed: CBC: Recent Labs  Lab 09/16/2018 1612 09/16/18 0150  WBC 11.7* 10.5  NEUTROABS  --  6.7  HGB 13.4 11.9*  HCT 40.5 37.2  MCV 83.2  85.1  PLT 289 482   Basic Metabolic Panel: Recent Labs  Lab 08/29/2018 1612 09/16/18 0150  NA 135 137  K 4.3 3.9  CL 106 109  CO2 20* 20*  GLUCOSE 103* 90  BUN 12 10  CREATININE 1.11* 0.94  CALCIUM 8.2* 7.5*    Liver Function Tests: Recent Labs  Lab 09/07/2018 1612 09/16/18 0150  AST 24 19  ALT 16 15  ALKPHOS 134* 114  BILITOT 1.0 0.9  PROT 5.9* 5.1*  ALBUMIN 1.8* 1.6*   Recent Labs  Lab 09/14/2018 1612 09/16/18 0150  LIPASE 26 24   No results for input(s): AMMONIA in the last 168 hours. Coagulation Profile: No results for input(s): INR, PROTIME in the last 168 hours. Cardiac Enzymes: Recent Labs  Lab 09/14/2018 1612  TROPONINI <0.03   BNP (last 3 results) No results for input(s): PROBNP in the last 8760 hours. CBG: No results for input(s): GLUCAP in the last 168 hours. Studies: Ct Abdomen Pelvis W Contrast  Result Date: 09/10/2018 CLINICAL DATA:  Weakness, hypotension, evaluate acute worsening of chronic wounds, ?deep space infection^141m ISOVUE-300 IOPAMIDOL (ISOVUE-300) INJECTION 61%Cannot find appropriate structured reason for exam - see REASON FOR EXAM (FREE TEXT) hypotension, evaluate acute worsening of chronic wounds, ?deep space infection EXAM: CT ABDOMEN AND PELVIS WITH CONTRAST TECHNIQUE: Multidetector CT imaging of the abdomen and pelvis was performed using the standard protocol following bolus administration of intravenous contrast. CONTRAST:  1072mISOVUE-300 IOPAMIDOL (ISOVUE-300) INJECTION 61% COMPARISON:  CT 979 FINDINGS: Lower chest: Lung bases are clear. Hepatobiliary: No focal hepatic lesion. No biliary duct dilatation. Gallbladder is normal. Common bile duct is normal. Pancreas: Within the tail of the pancreas, new fluid collection measuring 2.4 x 1.5 cm (image 23/2). There is mild peripancreatic fat inflammation in the splenic hilar region. The more distal body and head of the pancreas appear normal. No duct dilatation. Spleen: Normal spleen.  Normal  splenic vein. Adrenals/urinary tract: Adrenal glands and kidneys are normal. The ureters and bladder normal. Stomach/Bowel: Stomach, duodenum small-bowel normal. RIGHT lower quadrant ileostomy. Patient status post colectomy. 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 Vascular/Lymphatic: Abdominal aorta is normal caliber. No periportal or retroperitoneal adenopathy. No pelvic adenopathy. Reproductive: Uterus and ovaries normal Other: No deep wound infection identified. Musculoskeletal: No aggressive osseous lesion. IMPRESSION: 1. New small fluid collection in the tail the pancreas is concern for focal pancreatitis of the tail the pancreas. No extra pancreatic organized fluid collection. Minimal peripancreatic fat stranding. 2. 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 3. No evidence of deep wound infection. Electronically Signed   By: StSuzy Bouchard.D.   On: 09/17/2018 19:47     Time spent: 35 minutes  Author: PrBerle MullMD [facility] Pager: [phone]/25/2019 3:55 PM  Between 7PM-7AM, please contact night-coverage at [email or web address], password [email or web address]

## 2018-09-16 NOTE — Progress Notes (Signed)
Pt assisted up to [facility] to void. HR up to 170-200. Pt c/o feeling "dizzy". Assisted back to bed and HR returned to 90's-100. Also having abdominal pain 10/10. MD notified and orders received. Eulas Post, RN

## 2018-09-17 DIAGNOSIS — D649 Anemia, unspecified: Secondary | ICD-10-CM

## 2018-09-17 LAB — CBC WITH DIFFERENTIAL/PLATELET
Abs Immature Granulocytes: 0.08 10*3/uL — ABNORMAL HIGH (ref 0.00–0.07)
Basophils Absolute: 0 10*3/uL (ref 0.0–0.1)
Basophils Relative: 0 %
EOS ABS: 0.1 10*3/uL (ref 0.0–0.5)
Eosinophils Relative: 1 %
HCT: 38.3 % (ref 36.0–46.0)
Hemoglobin: 12.4 g/dL (ref 12.0–15.0)
Immature Granulocytes: 1 %
Lymphocytes Relative: 20 %
Lymphs Abs: 2.2 10*3/uL (ref 0.7–4.0)
MCH: 27.4 pg (ref 26.0–34.0)
MCHC: 32.4 g/dL (ref 30.0–36.0)
MCV: 84.7 fL (ref 80.0–100.0)
Monocytes Absolute: 1.1 10*3/uL — ABNORMAL HIGH (ref 0.1–1.0)
Monocytes Relative: 10 %
Neutro Abs: 7.5 10*3/uL (ref 1.7–7.7)
Neutrophils Relative %: 68 %
Platelets: 246 10*3/uL (ref 150–400)
RBC: 4.52 MIL/uL (ref 3.87–5.11)
RDW: 16.6 % — ABNORMAL HIGH (ref 11.5–15.5)
WBC: 11 10*3/uL — ABNORMAL HIGH (ref 4.0–10.5)
nRBC: 0 % (ref 0.0–0.2)

## 2018-09-17 LAB — COMPREHENSIVE METABOLIC PANEL
ALT: 15 U/L (ref 0–44)
AST: 24 U/L (ref 15–41)
Albumin: 1.8 g/dL — ABNORMAL LOW (ref 3.5–5.0)
Alkaline Phosphatase: 117 U/L (ref 38–126)
Anion gap: 10 (ref 5–15)
BILIRUBIN TOTAL: 0.9 mg/dL (ref 0.3–1.2)
BUN: 7 mg/dL (ref 6–20)
CO2: 16 mmol/L — ABNORMAL LOW (ref 22–32)
Calcium: 7.6 mg/dL — ABNORMAL LOW (ref 8.9–10.3)
Chloride: 110 mmol/L (ref 98–111)
Creatinine, Ser: 0.91 mg/dL (ref 0.44–1.00)
GFR calc Af Amer: >60 mL/min (ref >60)
GFR calc non Af Amer: >60 mL/min (ref >60)
Glucose, Bld: 72 mg/dL (ref 70–99)
Potassium: 4 mmol/L (ref 3.5–5.1)
Sodium: 136 mmol/L (ref 135–145)
TOTAL PROTEIN: 5.3 g/dL — AB (ref 6.5–8.1)

## 2018-09-17 LAB — LIPASE, BLOOD: Lipase: 26 U/L (ref 11–51)

## 2018-09-17 LAB — MAGNESIUM: MAGNESIUM: 1.7 mg/dL (ref 1.7–2.4)

## 2018-09-17 LAB — SEDIMENTATION RATE: Sed Rate: 1 mm/hr (ref 0–22)

## 2018-09-17 LAB — LACTIC ACID, PLASMA: Lactic Acid, Venous: 1.7 mmol/L (ref 0.5–1.9)

## 2018-09-17 LAB — C-REACTIVE PROTEIN: CRP: 7.1 mg/dL — ABNORMAL HIGH (ref <1.0)

## 2018-09-17 LAB — AMYLASE: Amylase: 16 U/L — ABNORMAL LOW (ref 28–100)

## 2018-09-17 MED ORDER — HYDROMORPHONE HCL 1 MG/ML IJ SOLN
0.5000 mg | Freq: Once | INTRAMUSCULAR | Status: AC
  Administered 2018-09-17: 0.5 mg via INTRAVENOUS
  Filled 2018-09-17: qty 0.5

## 2018-09-17 MED ORDER — ADULT MULTIVITAMIN W/MINERALS CH
1.0000 | ORAL_TABLET | Freq: Every day | ORAL | Status: DC
  Administered 2018-09-18 – 2018-09-26 (×4): 1 via ORAL
  Filled 2018-09-17 (×8): qty 1

## 2018-09-17 MED ORDER — SENNOSIDES-DOCUSATE SODIUM 8.6-50 MG PO TABS
1.0000 | ORAL_TABLET | Freq: Two times a day (BID) | ORAL | Status: DC
  Administered 2018-09-22 – 2018-09-26 (×6): 1 via ORAL
  Filled 2018-09-17 (×14): qty 1

## 2018-09-17 MED ORDER — DULOXETINE HCL 20 MG PO CPEP
20.0000 mg | ORAL_CAPSULE | Freq: Every day | ORAL | Status: DC
  Administered 2018-09-17 – 2018-09-26 (×10): 20 mg via ORAL
  Filled 2018-09-17 (×11): qty 1

## 2018-09-17 MED ORDER — KETOROLAC TROMETHAMINE 15 MG/ML IJ SOLN
15.0000 mg | Freq: Four times a day (QID) | INTRAMUSCULAR | Status: DC
  Administered 2018-09-17 – 2018-09-22 (×17): 15 mg via INTRAVENOUS
  Filled 2018-09-17 (×19): qty 1

## 2018-09-17 MED ORDER — FLUDROCORTISONE ACETATE 0.1 MG PO TABS
0.1000 mg | ORAL_TABLET | Freq: Every day | ORAL | Status: DC
  Administered 2018-09-17 – 2018-09-23 (×7): 0.1 mg via ORAL
  Filled 2018-09-17 (×8): qty 1

## 2018-09-17 MED ORDER — POLYETHYLENE GLYCOL 3350 17 G PO PACK
17.0000 g | PACK | Freq: Every day | ORAL | Status: DC
  Administered 2018-09-23 – 2018-09-26 (×2): 17 g via ORAL
  Filled 2018-09-17 (×7): qty 1

## 2018-09-17 MED ORDER — PRO-STAT SUGAR FREE PO LIQD
30.0000 mL | Freq: Every day | ORAL | Status: DC
  Filled 2018-09-17: qty 30

## 2018-09-17 MED ORDER — TIZANIDINE HCL 4 MG PO TABS
4.0000 mg | ORAL_TABLET | Freq: Three times a day (TID) | ORAL | Status: DC | PRN
  Administered 2018-09-22 – 2018-09-23 (×3): 4 mg via ORAL
  Filled 2018-09-17 (×3): qty 1

## 2018-09-17 MED ORDER — JUVEN PO PACK
1.0000 | PACK | Freq: Two times a day (BID) | ORAL | Status: DC
  Filled 2018-09-17 (×3): qty 1

## 2018-09-17 MED ORDER — ENSURE ENLIVE PO LIQD: 237.0000 mL | ORAL | Status: DC

## 2018-09-17 MED ORDER — OXYCODONE HCL 5 MG PO TABS
15.0000 mg | ORAL_TABLET | ORAL | Status: DC | PRN
  Administered 2018-09-17 – 2018-09-23 (×20): 15 mg via ORAL
  Filled 2018-09-17 (×21): qty 3

## 2018-09-17 MED ORDER — HYDROMORPHONE HCL 1 MG/ML IJ SOLN
0.5000 mg | Freq: Three times a day (TID) | INTRAMUSCULAR | Status: DC | PRN
  Administered 2018-09-17 – 2018-09-26 (×18): 0.5 mg via INTRAVENOUS
  Filled 2018-09-17 (×2): qty 0.5
  Filled 2018-09-17: qty 1
  Filled 2018-09-17 (×5): qty 0.5
  Filled 2018-09-17: qty 1
  Filled 2018-09-17 (×2): qty 0.5
  Filled 2018-09-17: qty 1
  Filled 2018-09-17: qty 0.5
  Filled 2018-09-17: qty 1
  Filled 2018-09-17 (×5): qty 0.5

## 2018-09-17 NOTE — Progress Notes (Signed)
[facility] Progress Note  Patient: Dominique [name] WRU:[id number]   PCP: Benito Mccreedy, MD DOB: Nov 06, 1980   DOA: 09/07/2018   DOS: 09/17/2018   Date of Service: the patient was seen and examined on 09/17/2018  Brief hospital course: Pt. with PMH of Crohn's disease status post colectomy with diverting ostomy ; admitted on 09/02/2018, presented with complaint of abdominal pain, was found to have pancreatitis. Currently further plan is continue current care.  Subjective:  Reports a lot of pain.  No nausea no vomiting.  Assessment and Plan: 1. Abdominal pain with possible acute pancreatitis -cause not clear.  Will check triglyceride levels.  Denies drinking alcohol.  No definite gallstones seen.  Follow LFTs and lipase.  Continue with hydration.  Pain relief medications.  Decrease Dilaudid, switch to oxi.  Add Cymbalta. 2. Weakness likely from hypotension has chronic hypotension and history of POTS.  Does not look septic.    I will add Florinef which is first line treatment for pots.  We will slowly down titrate midodrine. 3. Leukocytosis afebrile at this time.  Chest x-ray unremarkable.  Check UA.  Follow CBC. 4. On beta-blockers not sure what patient is on for may be for palpitations.  Since patient was hypotensive will hold 5. Acute renal failure likely from hypotension and dehydration we will gently hydrate follow UA follow metabolic panel. 6. Sleep apnea noncompliant with CPAP. 7. Chronic anemia on iron supplements presently hemoglobin is around 13. 8. History of Crohn's status post left colectomy and colostomy placement.  In remission at this time. Chronic abdominal wounds for which wound team has been consulted  Diet: Soft diet DVT Prophylaxis: subcutaneous Heparin  Advance goals of care discussion: full code  Family Communication: no family was present at bedside, at the time of interview.   Disposition:  Discharge to home, difficult situation.  Will consult with case  management..  Consultants: none Procedures: noen  Scheduled Meds: . DULoxetine  20 mg Oral Daily  . famotidine  20 mg Oral BID  . feeding supplement (ENSURE ENLIVE)  237 mL Oral Q24H  . feeding supplement (PRO-STAT SUGAR FREE 64)  30 mL Oral Daily  . ferrous gluconate  324 mg Oral BID WC  . fludrocortisone  0.1 mg Oral Daily  . ketorolac  15 mg Intravenous Q6H  . midodrine  5 mg Oral TID WC  . [START ON 09/18/2018] multivitamin with minerals  1 tablet Oral Daily  . nutrition supplement (JUVEN)  1 packet Oral BID BM  . pantoprazole  40 mg Oral BID AC  . polyethylene glycol  17 g Oral Daily  . prednisoLONE acetate  1 drop Left Eye Q6H  . senna-docusate  1 tablet Oral BID   Continuous Infusions: . sodium chloride 75 mL/hr at 09/17/18 1004   PRN Meds: acetaminophen **OR** acetaminophen, HYDROmorphone (DILAUDID) injection, lip balm, ondansetron **OR** ondansetron (ZOFRAN) IV, oxyCODONE, tiZANidine Antibiotics: Anti-infectives (From admission, onward)   None       Objective: Physical Exam: Vitals:   09/16/18 2111 09/17/18 0500 09/17/18 0526 09/17/18 1422  BP: (!) 119/54  (!) 113/56 (!) 111/50  Pulse: (!) 102  (!) 110 95  Resp: 16  18 16   Temp: 98.2 F (36.8 C)  99.2 F (37.3 C) 98.5 F (36.9 C)  TempSrc: Oral  Oral Oral  SpO2: 98%  99% 100%  Weight:  (!) 171.9 kg      Intake/Output Summary (Last 24 hours) at 09/17/2018 1908 Last data filed at 09/17/2018 0600 Gross per  24 hour  Intake 711.95 ml  Output -  Net 711.95 ml   Filed Weights   09/16/18 0549 09/17/18 0500  Weight: 132 kg (!) 171.9 kg   General: Alert, Awake and Oriented to Time, Place and Person. Appear in moderate distress, affect appropriate Eyes: PERRL, Conjunctiva normal ENT: Oral Mucosa clear moist. Neck: no JVD, no Abnormal Mass Or lumps Cardiovascular: S1 and S2 Present, no Murmur, Peripheral Pulses Present Respiratory: normal respiratory effort, Bilateral Air entry equal and Decreased, no use  of accessory muscle, Clear to Auscultation, no Crackles, no wheezes Abdomen: Bowel Sound present, Soft and diffuse tenderness, no hernia Skin: no redness, no Rash, no induration Extremities: no Pedal edema, no calf tenderness Neurologic: Grossly no focal neuro deficit. Bilaterally Equal motor strength  Data Reviewed: CBC: Recent Labs  Lab 09/06/2018 1612 09/16/18 0150 09/17/18 0500  WBC 11.7* 10.5 11.0*  NEUTROABS  --  6.7 7.5  HGB 13.4 11.9* 12.4  HCT 40.5 37.2 38.3  MCV 83.2 85.1 84.7  PLT 289 193 903   Basic Metabolic Panel: Recent Labs  Lab 08/28/2018 1612 09/16/18 0150 09/17/18 0500  NA 135 137 136  K 4.3 3.9 4.0  CL 106 109 110  CO2 20* 20* 16*  GLUCOSE 103* 90 72  BUN 12 10 7   CREATININE 1.11* 0.94 0.91  CALCIUM 8.2* 7.5* 7.6*  MG  --   --  1.7    Liver Function Tests: Recent Labs  Lab 08/23/2018 1612 09/16/18 0150 09/17/18 0500  AST 24 19 24   ALT 16 15 15   ALKPHOS 134* 114 117  BILITOT 1.0 0.9 0.9  PROT 5.9* 5.1* 5.3*  ALBUMIN 1.8* 1.6* 1.8*   Recent Labs  Lab 09/20/2018 1612 09/16/18 0150 09/17/18 0500  LIPASE 26 24 26   AMYLASE  --   --  16*   No results for input(s): AMMONIA in the last 168 hours. Coagulation Profile: No results for input(s): INR, PROTIME in the last 168 hours. Cardiac Enzymes: Recent Labs  Lab 09/05/2018 1612  TROPONINI <0.03   BNP (last 3 results) No results for input(s): PROBNP in the last 8760 hours. CBG: No results for input(s): GLUCAP in the last 168 hours. Studies: No results found.   Time spent: 35 minutes  Author: Berle Mull, MD [facility] Pager: [phone] 09/17/2018 7:08 PM  Between 7PM-7AM, please contact night-coverage at [email or web address], password [facility]

## 2018-09-17 NOTE — Progress Notes (Signed)
Patient apathetic regarding self care. Doesn't want to move a lot due to constant pain with wounds. Unwilling to allow nurse to help with wound care. Nurse supervises and instructs pt on correct wound treatments. Personal hygiene poor. Eulas Post, RN

## 2018-09-17 NOTE — Progress Notes (Signed)
Pt. Bed soaked with urine, menses and stool from leaking colostomy. Pt. Refused to be bathed and for sheets to be changed during day shift. Pt. Agreed upon getting bathed at shift change. Pt. Would not allow staff to clean wounds. This RN and staff supervised pt. Cleaning wound sites and change of dressings. Pt. Ambulated to [facility]. HR increased up to 180's while out of bed. HR decreased to 90's once back in bed and comfortable. Will continue to monitor pt. Closely.

## 2018-09-17 NOTE — Progress Notes (Signed)
Pt. Made aware that dressing changes were twice a day and that we needed to bathe her prior to the dressing change in order to clean the wounds. Pt. Refused to be bathed and for dressing changes to be performed. Pt. Stated, "It's too painful." Pt. Educated on importance of wound care. Will make AM RN aware. Will continue to monitor pt. Closely.

## 2018-09-18 MED ORDER — MIDODRINE HCL 5 MG PO TABS
5.0000 mg | ORAL_TABLET | Freq: Two times a day (BID) | ORAL | Status: DC
  Administered 2018-09-18 – 2018-09-23 (×12): 5 mg via ORAL
  Filled 2018-09-18 (×13): qty 1

## 2018-09-18 NOTE — Progress Notes (Signed)
RN, Please call [facility] at discharge [phone].

## 2018-09-18 NOTE — Progress Notes (Signed)
Initial Nutrition Assessment  DOCUMENTATION CODES:   Morbid obesity  INTERVENTION:    Provide MVI daily  Encourage high protein food options TID  NUTRITION DIAGNOSIS:   Increased nutrient needs related to wound healing as evidenced by estimated needs.  GOAL:   Patient will meet greater than or equal to 90% of their needs  MONITOR:   PO intake, Supplement acceptance, Weight trends, Labs, I & O's, Skin  REASON FOR ASSESSMENT:   Malnutrition Screening Tool    ASSESSMENT:   Patient with PMH significant for morbid obesity, chronic intertrigo complicated by bacterial infection and poor healing, Crohn's disease s/p colectomy with diverting ostomy, and pulmonary HTN. She had been hospitalized since 07/04/2018: On 08/24/2018, corneal perforation was noted and patient was transferred to [facility] where she received keratoplasty on 08/27/2018 and was transferred back to [place] [facility] the following day.  Presents this admission with abdominal pain with possible acute pancreatitis.    Since 12/16 when pt was discharged, her appetite progressed well at home. States she typically eats one meal daily that consist of a meat, vegetable, and grain (her caretaker makes this for dinner when she leaves). Reports she will snack a little during the day but doesn't take much. In the past pt had difficulties with supplements despite having multiple options. She does not wish to have supplementation this admission. Discussed the importance of protein intake to aid with wound healing. Encouraged pt to consume high protein food options for each meal. This admission pt has not had anything PO. States her nausea resolved this am and she is going to eat breakfast.   Pt endorses a UBW of 350-360 lb and a recent wt loss of 30 lb. Records reflect pt has gained weight from previous admission. Will continue to monitor trends.   Medications reviewed and include: ferrous gluconate, MVI with minerals, miralax, senokot Labs  reviewed.   NUTRITION - FOCUSED PHYSICAL EXAM:    Most Recent Value  Orbital Region  No depletion  Upper Arm Region  No depletion  Thoracic and Lumbar Region  Unable to assess  Buccal Region  No depletion  Temple Region  No depletion  Clavicle Bone Region  No depletion  Clavicle and Acromion Bone Region  No depletion  Scapular Bone Region  No depletion  Dorsal Hand  No depletion  Patellar Region  Unable to assess  Anterior Thigh Region  No depletion  Posterior Calf Region  No depletion  Edema (RD Assessment)  Mild     Diet Order:   Diet Order            Diet regular Room service appropriate? Yes; Fluid consistency: Thin  Diet effective now              EDUCATION NEEDS:   Education needs have been addressed  Skin:  Skin Assessment: Skin Integrity Issues: Skin Integrity Issues:: Other (Comment) Other: non-pressure wounds- abdomen upper and lower, L breast, bilateral groin, sacrum, bilateral lower back  Last BM:  12/26  Height:   Ht Readings from Last 1 Encounters:  08/28/18 5' 2"  (1.575 m)    Weight:   Wt Readings from Last 1 Encounters:  09/18/18 (!) 176.4 kg    Ideal Body Weight:  50 kg  BMI:  Body mass index is 71.15 kg/m.  Estimated Nutritional Needs:   Kcal:  2100-2300 kcal  Protein:  105-120 grams  Fluid:  >/= 2.1 L/day   Mariana Single RD, LDN Clinical Nutrition Pager # - [phone]

## 2018-09-18 NOTE — Care Management Note (Addendum)
Case Management Note  Patient Details  Name: Dominique [name] MRN: [id number] Date of Birth: 04-02-1981  Subjective/Objective:  Pt admitted with acute pancreatitis.                  Action/Plan: Pt was active with [facility] and plan to return with [facility]. Pt also have Medicaid and unable to assist with medications co-pay. Encouraged pt to check with drug company for any assistance programs.   Expected Discharge Date:  (unknown)               Expected Discharge Plan:  [place]  In-House Referral:     Discharge planning Services  CM Consult  Post Acute Care Choice:    Choice offered to:  Patient  DME Arranged:    DME Agency:     HH Arranged:  RN, PT, OT HH Agency:  [facility]  Status of Service:  Completed, signed off  If discussed at [place] of Stay Meetings, dates discussed:    Additional CommentsPurcell Mouton, RN 09/18/2018, 9:47 AM

## 2018-09-18 NOTE — Progress Notes (Signed)
[facility] Progress Note  Patient: Dominique [name] TIW:[id number]   PCP: Benito Mccreedy, MD DOB: November 13, 1980   DOA: 09/13/2018   DOS: 09/18/2018   Date of Service: the patient was seen and examined on 09/18/2018  Brief hospital course: Pt. with PMH of Crohn's disease status post colectomy with diverting ostomy ; admitted on 09/16/2018, presented with complaint of abdominal pain, was found to have pancreatitis. Currently further plan is continue current care.  Subjective:  Pain well controlled.  No nausea no vomiting.  Assessment and Plan: 1. Abdominal pain with possible acute pancreatitis -cause not clear.  Will check triglyceride levels.  Denies drinking alcohol.  No definite gallstones seen.  Stop IV fluids. Pain relief medications.  Decrease Dilaudid, switch to oxi.  Add Cymbalta. 2. Weakness likely from hypotension has chronic hypotension and history of POTS.  Does not look septic.    I will add Florinef which is first line treatment for pots.  We will slowly down titrate midodrine. 3. Leukocytosis afebrile at this time.  Chest x-ray unremarkable.  4. On beta-blockers not sure what patient is on for may be for palpitations.  Since patient was hypotensive will hold 5. Acute renal failure likely from hypotension and dehydration improved with hydration. 6. Sleep apnea noncompliant with CPAP. 7. Chronic anemia on iron supplements presently hemoglobin is around 13. 8. History of Crohn's status post left colectomy and colostomy placement.  In remission at this time. 9. Chronic abdominal wounds for which wound team has been consulted likely primary problem for second patient's pain.  Discussed with social worker unable to arrange outpatient SNF assistance.  Home health arranged by case management.  Diet: Soft diet DVT Prophylaxis: subcutaneous Heparin  Advance goals of care discussion: full code  Family Communication: no family was present at bedside, at the time of interview.    Disposition:  Discharge to home, likely tomorrow..  Consultants: none Procedures: noen  Scheduled Meds: . DULoxetine  20 mg Oral Daily  . famotidine  20 mg Oral BID  . ferrous gluconate  324 mg Oral BID WC  . fludrocortisone  0.1 mg Oral Daily  . ketorolac  15 mg Intravenous Q6H  . midodrine  5 mg Oral BID WC  . multivitamin with minerals  1 tablet Oral Daily  . pantoprazole  40 mg Oral BID AC  . polyethylene glycol  17 g Oral Daily  . prednisoLONE acetate  1 drop Left Eye Q6H  . senna-docusate  1 tablet Oral BID   Continuous Infusions:  PRN Meds: acetaminophen **OR** acetaminophen, HYDROmorphone (DILAUDID) injection, lip balm, ondansetron **OR** ondansetron (ZOFRAN) IV, oxyCODONE, tiZANidine Antibiotics: Anti-infectives (From admission, onward)   None       Objective: Physical Exam: Vitals:   09/17/18 2318 09/18/18 0543 09/18/18 0548 09/18/18 1318  BP: 114/61 (!) 109/57  108/63  Pulse: 95 (!) 111  93  Resp: 18 20  14   Temp: 98.2 F (36.8 C) 98.7 F (37.1 C)  98.1 F (36.7 C)  TempSrc: Oral Oral  Oral  SpO2: 100% 99%  100%  Weight:   (!) 176.4 kg     Intake/Output Summary (Last 24 hours) at 09/18/2018 1827 Last data filed at 09/18/2018 1001 Gross per 24 hour  Intake 1796.19 ml  Output 550 ml  Net 1246.19 ml   Filed Weights   09/16/18 0549 09/17/18 0500 09/18/18 0548  Weight: 132 kg (!) 171.9 kg (!) 176.4 kg   General: Alert, Awake and Oriented to Time, Place and Person. Appear  in moderate distress, affect appropriate Eyes: PERRL, Conjunctiva normal ENT: Oral Mucosa clear moist. Neck: no JVD, no Abnormal Mass Or lumps Cardiovascular: S1 and S2 Present, no Murmur, Peripheral Pulses Present Respiratory: normal respiratory effort, Bilateral Air entry equal and Decreased, no use of accessory muscle, Clear to Auscultation, no Crackles, no wheezes Abdomen: Bowel Sound present, Soft and diffuse tenderness, no hernia Skin: no redness, no Rash, no  induration Extremities: no Pedal edema, no calf tenderness Neurologic: Grossly no focal neuro deficit. Bilaterally Equal motor strength  Data Reviewed: CBC: Recent Labs  Lab 09/10/2018 1612 09/16/18 0150 09/17/18 0500  WBC 11.7* 10.5 11.0*  NEUTROABS  --  6.7 7.5  HGB 13.4 11.9* 12.4  HCT 40.5 37.2 38.3  MCV 83.2 85.1 84.7  PLT 289 193 588   Basic Metabolic Panel: Recent Labs  Lab 09/10/2018 1612 09/16/18 0150 09/17/18 0500  NA 135 137 136  K 4.3 3.9 4.0  CL 106 109 110  CO2 20* 20* 16*  GLUCOSE 103* 90 72  BUN 12 10 7   CREATININE 1.11* 0.94 0.91  CALCIUM 8.2* 7.5* 7.6*  MG  --   --  1.7    Liver Function Tests: Recent Labs  Lab 09/16/2018 1612 09/16/18 0150 09/17/18 0500  AST 24 19 24   ALT 16 15 15   ALKPHOS 134* 114 117  BILITOT 1.0 0.9 0.9  PROT 5.9* 5.1* 5.3*  ALBUMIN 1.8* 1.6* 1.8*   Recent Labs  Lab 09/12/2018 1612 09/16/18 0150 09/17/18 0500  LIPASE 26 24 26   AMYLASE  --   --  16*   No results for input(s): AMMONIA in the last 168 hours. Coagulation Profile: No results for input(s): INR, PROTIME in the last 168 hours. Cardiac Enzymes: Recent Labs  Lab 09/14/2018 1612  TROPONINI <0.03   BNP (last 3 results) No results for input(s): PROBNP in the last 8760 hours. CBG: No results for input(s): GLUCAP in the last 168 hours. Studies: No results found.   Time spent: 35 minutes  Author: Berle Mull, MD [facility] Pager: [phone] 09/18/2018 6:27 PM  Between 7PM-7AM, please contact night-coverage at [email or web address], password [facility]

## 2018-09-18 NOTE — Progress Notes (Signed)
Multiple attempts made to change wound dressing but patient continued to postponed dressing change stating several times "im not ready come back later." Patient was informed multiple times that her dressing needs to be changed at least twice per day order but she continued to refused. Offered to change dressing again at 5:43pm pt stated she would rather have her dressing changed by night shift nurse.

## 2018-09-19 ENCOUNTER — Other Ambulatory Visit: Payer: Self-pay

## 2018-09-19 DIAGNOSIS — R112 Nausea with vomiting, unspecified: Secondary | ICD-10-CM

## 2018-09-19 DIAGNOSIS — Z433 Encounter for attention to colostomy: Secondary | ICD-10-CM

## 2018-09-19 DIAGNOSIS — R1110 Vomiting, unspecified: Secondary | ICD-10-CM

## 2018-09-19 DIAGNOSIS — I951 Orthostatic hypotension: Secondary | ICD-10-CM

## 2018-09-19 NOTE — Progress Notes (Signed)
PT Cancellation Note  Patient Details Name: Dominique [name] MRN: [id number] DOB: 1981/07/19   Cancelled Treatment:    Reason Eval/Treat Not Completed: Fatigue/lethargy limiting ability to participate;Pain limiting ability to participate   [facility] 09/19/2018, 2:20 PM

## 2018-09-19 NOTE — Progress Notes (Signed)
Partial bath gives and all interdry changed as much as pt would allow. Antifungal powder to skin folds. Pt is very guarded and has a lot of pain and has difficulty allowing skin care. Dr Erlinda Hong is aware.

## 2018-09-19 NOTE — Progress Notes (Signed)
Consult was placed for iv restart;  Both arms assessed thoroughly with ultrasound;  No veins noted;  RN aware.   Raynelle Fanning RN IV Team

## 2018-09-19 NOTE — Progress Notes (Signed)
PROGRESS NOTE  Dominique [name] FYT:[id number] DOB: 1981/08/26 DOA: 09/01/2018 PCP: Benito Mccreedy, MD  HPI/Recap of past 24 hours:  Crying in pain, report left flank pain and buttock pain,  Currently report no stomach pain but does has intermittent n/v, she is afraid to eat or drink anything No fever  Assessment/Plan: Principal Problem:   Acute pancreatitis Active Problems:   Orthostatic hypotension   Crohn's colitis with perforation s/p left colectomy/colostomy 2016   OSA on CPAP   Chronic anemia   ARF (acute renal failure) (HCC)  Abdominal pain n/v (presenting symptom) -CT ab on presentation with "New small fluid collection in the tail the pancreas is concern for focal pancreatitis of the tail the pancreas." but lipase is not elevated -continue have n/v, not able to tolerate diet, Case discussed with [facility] Dr Paulita Fujita who will see patient in consult  H/o Crohn's colitis s/p colostomy  Urinary incontinence Moisture related skin damage Skin irritation at intertriginous region with significant pain Was seen by general surgery during last admission for this , recommended  Outpatient dermatology referral  Wound care consulted on 12/25, input appreciated  H/o POTS; on florinef and midorine, home meds betablocker held due to borderline bp  Morbid obesity /OSA/cpap Body mass index is 71.15 kg/m.    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  Home health arranged by case management." LTAC is an option?   Code Status: full  Family Communication: patient   Disposition Plan: pending   Consultants:  [facility] Dr  Paulita Fujita  Procedures:  none  Antibiotics:  none   Objective: BP 114/71 (BP Location: Right Arm)   Pulse (!) 111   Temp 98.2 F (36.8 C) (Oral)   Resp 20   Ht 5' 2"  (1.575 m)   Wt (!) 176.4 kg   SpO2 99%   BMI 71.15 kg/m   Intake/Output Summary (Last 24 hours) at 09/19/2018 1148 Last data filed at 09/19/2018 1100 Gross per 24 hour  Intake 120 ml  Output 300 ml  Net -180 ml   Filed Weights   09/16/18 0549 09/17/18 0500 09/18/18 0548  Weight: 132 kg (!) 171.9 kg (!) 176.4 kg    Exam: Patient is examined daily including today on 09/19/2018, exams remain the same as of yesterday except that has changed    General:  Obese, sitting up on bedside commode , chronic intertriginous wound, very painful to light touch  Cardiovascular: RRR  Respiratory: CTABL  Abdomen: Soft/ND/NT, positive BS, + colostomy with liquid stool  Musculoskeletal: No Edema  Neuro: alert, oriented   Data Reviewed: Basic Metabolic Panel: Recent Labs  Lab 08/27/2018 1612 09/16/18 0150 09/17/18 0500  NA 135 137 136  K 4.3 3.9 4.0  CL 106 109 110  CO2 20* 20* 16*  GLUCOSE 103* 90 72  BUN 12 10 7   CREATININE 1.11* 0.94 0.91  CALCIUM 8.2* 7.5* 7.6*  MG  --   --  1.7   Liver Function Tests: Recent Labs  Lab 08/25/2018 1612 09/16/18 0150 09/17/18 0500  AST 24 19 24   ALT 16 15 15   ALKPHOS 134* 114 117  BILITOT 1.0 0.9 0.9  PROT 5.9* 5.1* 5.3*  ALBUMIN  1.8* 1.6* 1.8*   Recent Labs  Lab 08/25/2018 1612 09/16/18 0150 09/17/18 0500  LIPASE 26 24 26   AMYLASE  --   --  16*   No results for input(s): AMMONIA in the last 168 hours. CBC: Recent Labs  Lab 08/29/2018 1612 09/16/18 0150 09/17/18 0500  WBC 11.7* 10.5 11.0*  NEUTROABS  --  6.7 7.5  HGB 13.4 11.9* 12.4  HCT 40.5 37.2 38.3  MCV 83.2 85.1 84.7  PLT 289 193 246   Cardiac Enzymes:   Recent Labs  Lab 09/05/2018 1612  TROPONINI <0.03   BNP (last 3 results) No results for input(s): BNP in the last 8760 hours.  ProBNP  (last 3 results) No results for input(s): PROBNP in the last 8760 hours.  CBG: No results for input(s): GLUCAP in the last 168 hours.  No results found for this or any previous visit (from the past 240 hour(s)).   Studies: No results found.  Scheduled Meds: . DULoxetine  20 mg Oral Daily  . famotidine  20 mg Oral BID  . ferrous gluconate  324 mg Oral BID WC  . fludrocortisone  0.1 mg Oral Daily  . ketorolac  15 mg Intravenous Q6H  . midodrine  5 mg Oral BID WC  . multivitamin with minerals  1 tablet Oral Daily  . pantoprazole  40 mg Oral BID AC  . polyethylene glycol  17 g Oral Daily  . prednisoLONE acetate  1 drop Left Eye Q6H  . senna-docusate  1 tablet Oral BID    Continuous Infusions:   Time spent: 70mns, case discussed with [facility] I have personally reviewed and interpreted on  09/19/2018 daily labs, tele strips, imagings as discussed above under date review session and assessment and plans.  I reviewed all nursing notes, pharmacy notes, consultant notes,  vitals, pertinent old records  I have discussed plan of care as described above with RN , patient  on 09/19/2018   FFlorencia ReasonsMD, PhD  [facility] Pager [phone] If 7PM-7AM, please contact night-coverage at [email or web address], password [email or web address]12/28/2019, 11:48 AM  LOS: 3 days

## 2018-09-19 NOTE — Progress Notes (Signed)
Made Dr Erlinda Hong aware of unable to obtain IV access. New orders received.

## 2018-09-19 NOTE — Progress Notes (Signed)
Patient does not want to wear CPAP

## 2018-09-19 NOTE — Consult Note (Signed)
[facility] Nurse wound consult note Reason for Consult: Reconsulted today for patient's wounds and ostomy.  Seen on 09/16/18 (3 days ago) and orders provided for wounds, as well as ostomy care.  Also provided guidance for turning and repositioning and a mattress replacement with low air loss feature for moisture associated skin damage as well as for management of microclimate. See noted dated 09/16/18.  [facility] nursing team will not follow, but will remain available to this patient, the nursing and medical teams.  Please re-consult if needed. Thanks, Maudie Flakes, MSN, RN, [place], Ar[facility]  Pager# [phone]

## 2018-09-19 NOTE — Consult Note (Signed)
[facility] Gastroenterology Consultation Note  Referring Provider: Dr. Florencia Reasons [facility]) Primary Care Physician:  Benito Mccreedy, MD  Reason for Consultation:  Nausea, vomiting, pancreatic cyst  HPI: Dominique [name] is a 37 y.o. female admitted weakness and dehydration.  Crohn's disease with multiple surgeries, abscess, drains, ostomies and revisions.  No abdominal pain.  Has been nauseated with some vomiting for the past couple days.     Past Medical History:  Diagnosis Date  . Acute acalculous cholecystitis 11/16/2014  . Anal fistula   . Anemia   . Anxiety   . Asthma   . CHF (congestive heart failure) ([place])   . Complication of anesthesia    states she had a problem staying awake after her c-section, [facility]  . Coronary artery disease   . Diverticulitis of colon 11/17/2014  . Headache    used to have migraines  . Hepatic steatosis 11/17/2014  . Herpes   . Hypertension   . Morbid obesity ([place]) 03/22/2009   Qualifier: Diagnosis of  By: Ronnald Ramp CMA, [place]    . Neuropathy   . OSA (obstructive sleep apnea) 05/02/2014  . Seasonal allergies 06/13/2014  . Sickle cell anemia ([place])    She states she " has the trait" (07/20/15)  . Sleep apnea    uses Bipap  . Thyromegaly 05/02/2014    Past Surgical History:  Procedure Laterality Date  . c section 2011  2011  . COLONOSCOPY WITH PROPOFOL N/A 12/27/2015   Procedure: COLONOSCOPY WITH PROPOFOL;  Surgeon: Wonda Horner, MD;  Location: [facility] ENDOSCOPY;  Service: Endoscopy;  Laterality: N/A;  . COLOSTOMY N/A 06/11/2015   Procedure:  CREATION OF COLOSTOMY;  Surgeon: Georganna Skeans, MD;  Location: [place];  Service: General;  Laterality: N/A;  . ESOPHAGOGASTRODUODENOSCOPY N/A 07/19/2015   Procedure: ESOPHAGOGASTRODUODENOSCOPY (EGD);  Surgeon: Clarene Essex, MD;  Location: [facility] ENDOSCOPY;  Service: Endoscopy;  Laterality: N/A;  . FLEXIBLE SIGMOIDOSCOPY N/A 06/05/2015   Procedure: FLEXIBLE SIGMOIDOSCOPY;  Surgeon: Ronald Lobo, MD;   Location: [facility] ENDOSCOPY;  Service: Endoscopy;  Laterality: N/A;  . LAPAROTOMY N/A 06/09/2015   Procedure: EXPLORATORY LAPAROTOMY, DRAINAGE OF INTRAABDOMINAL ABSCESS, PARTIAL COLON RESECTION,  APPLICATION OF WOUND VAC;  Surgeon: Georganna Skeans, MD;  Location: [place];  Service: General;  Laterality: N/A;  . LAPAROTOMY N/A 06/11/2015   Procedure: RE-EXPLORATION OF ABDOMEN;  Surgeon: Georganna Skeans, MD;  Location: [place];  Service: General;  Laterality: N/A;    Prior to Admission medications   Medication Sig Start Date End Date Taking? Authorizing Provider  acetaminophen (TYLENOL) 500 MG tablet Take 500 mg by mouth every 6 (six) hours as needed for mild pain, moderate pain or headache.   Yes [provider]  ferrous gluconate (FERGON) 324 MG tablet Take 1 tablet (324 mg total) by mouth 2 (two) times daily with a meal. 07/31/18  Yes Charlynne Cousins, MD  methocarbamol (ROBAXIN) 500 MG tablet Take 1 tablet (500 mg total) by mouth every 6 (six) hours as needed for muscle spasms. 09/07/18  Yes Eugenie Filler, MD  oxyCODONE (ROXICODONE) 15 MG immediate release tablet Take 1 tablet (15 mg total) by mouth every 4 (four) hours as needed for moderate pain. 09/07/18  Yes Eugenie Filler, MD  tiZANidine (ZANAFLEX) 4 MG tablet Take 4 mg by mouth every 8 (eight) hours as needed for muscle spasms.   Yes [provider]  albuterol (PROVENTIL) (2.5 MG/3ML) 0.083% nebulizer solution Take 3 mLs (2.5 mg total) by nebulization  2 (two) times daily as needed for wheezing. Patient not taking: Reported on 09/08/2018 06/03/18   Kayleen Memos, DO  Amino Acids-Protein Hydrolys (FEEDING SUPPLEMENT, PRO-STAT SUGAR FREE 64,) LIQD Take 30 mLs by mouth daily. Patient not taking: Reported on 09/01/2018 07/09/18   Debbe Odea, MD  bisacodyl (DULCOLAX) 5 MG EC tablet Take 1 tablet (5 mg total) by mouth daily as needed for moderate constipation. Patient not taking: Reported on 08/23/2018 07/09/18   Debbe Odea, MD  famotidine (PEPCID) 20 MG tablet Take 1 tablet (20 mg total) by mouth 2 (two) times daily. 09/07/18   Eugenie Filler, MD  feeding supplement, ENSURE ENLIVE, (ENSURE ENLIVE) LIQD Take 237 mLs by mouth daily. Patient not taking: Reported on 09/13/2018 07/10/18   Debbe Odea, MD  metoprolol tartrate (LOPRESSOR) 25 MG tablet Take 0.5 tablets (12.5 mg total) by mouth 2 (two) times daily. 09/07/18   Eugenie Filler, MD  midodrine (PROAMATINE) 5 MG tablet Take 1 tablet (5 mg total) by mouth 3 (three) times daily with meals. 09/07/18   Eugenie Filler, MD  Multiple Vitamin (MULTIVITAMIN WITH MINERALS) TABS tablet Take 1 tablet by mouth daily. Patient not taking: Reported on 08/28/2018 07/10/18   Debbe Odea, MD  nutrition supplement, JUVEN, (JUVEN) PACK Take 1 packet by mouth 2 (two) times daily between meals. Patient not taking: Reported on 09/22/2018 07/09/18   Debbe Odea, MD  pantoprazole (PROTONIX) 40 MG tablet Take 1 tablet (40 mg total) by mouth 2 (two) times daily before a meal. 09/07/18   Eugenie Filler, MD  polyethylene glycol [facility] Floria Raveling) packet Take 17 g by mouth daily. Patient not taking: Reported on 09/20/2018 09/07/18   Eugenie Filler, MD  senna-docusate (SENOKOT-S) 8.6-50 MG tablet Take 1 tablet by mouth 2 (two) times daily. Patient not taking: Reported on 09/04/2018 09/07/18   Eugenie Filler, MD    Current Facility-Administered Medications  Medication Dose Route Frequency Provider Last Rate Last Dose  . acetaminophen (TYLENOL) tablet 650 mg  650 mg Oral Q6H PRN Rise Patience, MD       Or  . acetaminophen (TYLENOL) suppository 650 mg  650 mg Rectal Q6H PRN Rise Patience, MD      . DULoxetine (CYMBALTA) DR capsule 20 mg  20 mg Oral Daily Lavina Hamman, MD   20 mg at 09/19/18 0853  . famotidine (PEPCID) tablet 20 mg  20 mg Oral BID Rise Patience, MD   20 mg at 09/19/18 0853  . ferrous gluconate (FERGON) tablet 324 mg   324 mg Oral BID WC Rise Patience, MD   324 mg at 09/19/18 0853  . fludrocortisone (FLORINEF) tablet 0.1 mg  0.1 mg Oral Daily Lavina Hamman, MD   0.1 mg at 09/19/18 0853  . HYDROmorphone (DILAUDID) injection 0.5 mg  0.5 mg Intravenous TID PRN Lavina Hamman, MD   0.5 mg at 09/18/18 2111  . ketorolac (TORADOL) 15 MG/ML injection 15 mg  15 mg Intravenous Q6H Lavina Hamman, MD   15 mg at 09/19/18 0023  . lip balm (CARMEX) ointment   Topical PRN Rise Patience, MD      . midodrine (PROAMATINE) tablet 5 mg  5 mg Oral BID WC Lavina Hamman, MD   5 mg at 09/19/18 0854  . multivitamin with minerals tablet 1 tablet  1 tablet Oral Daily Lavina Hamman, MD   1 tablet at 09/18/18 0959  . ondansetron (  ZOFRAN) tablet 4 mg  4 mg Oral Q6H PRN Rise Patience, MD   4 mg at 09/19/18 1216   Or  . ondansetron (ZOFRAN) injection 4 mg  4 mg Intravenous Q6H PRN Rise Patience, MD   4 mg at 09/18/18 1733  . oxyCODONE (Oxy IR/ROXICODONE) immediate release tablet 15 mg  15 mg Oral Q4H PRN Lavina Hamman, MD   15 mg at 09/19/18 1534  . pantoprazole (PROTONIX) EC tablet 40 mg  40 mg Oral BID AC Rise Patience, MD   40 mg at 09/19/18 1535  . polyethylene glycol (MIRALAX / GLYCOLAX) packet 17 g  17 g Oral Daily Lavina Hamman, MD      . prednisoLONE acetate (PRED FORTE) 1 % ophthalmic suspension 1 drop  1 drop Left Eye Q6H Rise Patience, MD   1 drop at 09/19/18 1254  . senna-docusate (Senokot-S) tablet 1 tablet  1 tablet Oral BID Lavina Hamman, MD      . tiZANidine (ZANAFLEX) tablet 4 mg  4 mg Oral Q8H PRN Lavina Hamman, MD        Allergies as of 08/24/2018 - Review Complete 08/26/2018  Allergen Reaction Noted  . Other Shortness Of Breath and Swelling 11/11/2014  . Penicillins Other (See Comments) 03/22/2009    Family History  Problem Relation Age of Onset  . Hypertension Mother   . Allergies Mother   . Hypertension Father   . Cancer Paternal Grandmother        lung   . Hypertension Sister   . Asthma Brother   . Diabetes Maternal Grandmother   . Hypertension Maternal Grandmother   . Hypertension Maternal Grandfather   . Emphysema Paternal Grandfather     Social History   Socioeconomic History  . Marital status: Single    Spouse name: Not on file  . Number of children: Not on file  . Years of education: Not on file  . Highest education level: Not on file  Occupational History  . Not on file  Social Needs  . Financial resource strain: Not on file  . Food insecurity:    Worry: Not on file    Inability: Not on file  . Transportation needs:    Medical: Not on file    Non-medical: Not on file  Tobacco Use  . Smoking status: Current Some Day Smoker    Packs/day: 0.50    Years: 4.00    Pack years: 2.00    Types: Cigarettes    Start date: 07/24/2014    Last attempt to quit: 05/27/2015    Years since quitting: 3.3  . Smokeless tobacco: Never Used  Substance and Sexual Activity  . Alcohol use: No    Alcohol/week: 0.0 standard drinks  . Drug use: No  . Sexual activity: Not on file  Lifestyle  . Physical activity:    Days per week: Not on file    Minutes per session: Not on file  . Stress: Not on file  Relationships  . Social connections:    Talks on phone: Not on file    Gets together: Not on file    Attends religious service: Not on file    Active member of club or organization: Not on file    Attends meetings of clubs or organizations: Not on file    Relationship status: Not on file  . Intimate partner violence:    Fear of current or ex partner: Not on file  Emotionally abused: Not on file    Physically abused: Not on file    Forced sexual activity: Not on file  Other Topics Concern  . Not on file  Social History Narrative  . Not on file    Review of Systems: As per HPI, all others negative  Physical Exam: Vital signs in last 24 hours: Temp:  [97.7 F (36.5 C)-98.2 F (36.8 C)] 98.2 F (36.8 C) (12/28 0513) Pulse  Rate:  [95-111] 95 (12/28 1458) Resp:  [16-20] 16 (12/28 1458) BP: (108-118)/(68-71) 118/69 (12/28 1458) SpO2:  [99 %-100 %] 100 % (12/28 1458) Last BM Date: 09/18/18 General:   Alert, bedbound,  Morbidly obese, much older-appearing than stated age Head:  Normocephalic and atraumatic. Eyes:  Sclera clear, no icterus.   Conjunctiva pink. Ears:  Normal auditory acuity. Nose:  No deformity, discharge,  or lesions. Mouth:  No deformity or lesions.  Oropharynx pink & moist. Neck:  Thick, Supple; no masses or thyromegaly. Lungs:  Clear throughout to auscultation.   No wheezes, crackles, or rhonchi. No acute distress. Heart:  Regular rate and rhythm; no murmurs, clicks, rubs,  or gallops. Abdomen:  Soft, protuberant, foul-smelling purulence from abdominal incisions; RUQ colostomy;  No masses, hepatosplenomegaly or hernias noted. No peritonitis Msk:  Symmetrical without gross deformities. Normal posture. Pulses:  Normal pulses noted. Extremities:  Without clubbing or edema. Neurologic:  Alert and  oriented x4; diffusely weak  grossly normal neurologically. Skin:  Intact without significant lesions or rashes. Psych:  Alert and cooperative. Normal mood and affect.   Lab Results: Recent Labs    09/17/18 0500  WBC 11.0*  HGB 12.4  HCT 38.3  PLT 246   BMET Recent Labs    09/17/18 0500  NA 136  K 4.0  CL 110  CO2 16*  GLUCOSE 72  BUN 7  CREATININE 0.91  CALCIUM 7.6*   LFT Recent Labs    09/17/18 0500  PROT 5.3*  ALBUMIN 1.8*  AST 24  ALT 15  ALKPHOS 117  BILITOT 0.9   PT/INR No results for input(s): LABPROT, INR in the last 72 hours.  Studies/Results: No results found.  Impression:  1.  Nausea and vomiting.  Multiple potential causes, but POTS commonly can cause this.  Patient could be having side effect to medications, have gastroparesis or variety other causes.  No evidence of bowel obstruction on recent CTs. 2.  Abdominal wall wounds; poorly healing and  foul-smelling. 3.  Possible cyst pancreas on CT.  Patient has no clinical symptoms of pancreatitis at this time, and her lipase was normal.  Admittedly, with patient's Crohn's and recurrent diverticulitis and morbid obesity, it's hard to tell for sure what this cystic region is.    Plan:  1.  Patient is in no shape for pursuing any aggressive diagnostic interventions, and I think it actually would be more appropriate to manage supportively at this time. 2.  De-escalate diet to clear liquids.  Can slowly advance tomorrow when/if she improves. 3.  Antiemetics.  Minimize narcotics. 4.  Agree with wound care consult; does she need antibiotics or topical therapy? 5.  I do not feel she has active Crohn's and I'm not readily convinced she has pancreatitis, and don't think any further testing should be pursued at the present time.  6.  Patient has had endoscopies before for nausea and vomiting, last 2016.  I would not repeat endoscopy.  Don't think any findings on gastric emptying study will affect our management.  Cystic region does not appear to be abscess, and even if it were, it's too small and I doubt readily accessible percutaneously. 7.  [facility] will revisit Monday.   LOS: 3 days   Macalister Arnaud M  09/19/2018, 4:01 PM  Cell [phone] If no answer or after 5 PM call [phone]

## 2018-09-20 DIAGNOSIS — G8929 Other chronic pain: Secondary | ICD-10-CM

## 2018-09-20 DIAGNOSIS — R627 Adult failure to thrive: Secondary | ICD-10-CM

## 2018-09-20 DIAGNOSIS — R42 Dizziness and giddiness: Secondary | ICD-10-CM

## 2018-09-20 LAB — CBC WITH DIFFERENTIAL/PLATELET
Abs Immature Granulocytes: 0.14 10*3/uL — ABNORMAL HIGH (ref 0.00–0.07)
Basophils Absolute: 0.1 10*3/uL (ref 0.0–0.1)
Basophils Relative: 0 %
EOS ABS: 0.3 10*3/uL (ref 0.0–0.5)
Eosinophils Relative: 3 %
HCT: 33.3 % — ABNORMAL LOW (ref 36.0–46.0)
Hemoglobin: 11.4 g/dL — ABNORMAL LOW (ref 12.0–15.0)
IMMATURE GRANULOCYTES: 1 %
Lymphocytes Relative: 9 %
Lymphs Abs: 1.1 10*3/uL (ref 0.7–4.0)
MCH: 27.1 pg (ref 26.0–34.0)
MCHC: 34.2 g/dL (ref 30.0–36.0)
MCV: 79.1 fL — ABNORMAL LOW (ref 80.0–100.0)
Monocytes Absolute: 1.2 10*3/uL — ABNORMAL HIGH (ref 0.1–1.0)
Monocytes Relative: 10 %
NEUTROS PCT: 77 %
Neutro Abs: 8.6 10*3/uL — ABNORMAL HIGH (ref 1.7–7.7)
Platelets: 266 10*3/uL (ref 150–400)
RBC: 4.21 MIL/uL (ref 3.87–5.11)
RDW: 15.9 % — ABNORMAL HIGH (ref 11.5–15.5)
WBC: 11.4 10*3/uL — ABNORMAL HIGH (ref 4.0–10.5)
nRBC: 0 % (ref 0.0–0.2)

## 2018-09-20 LAB — COMPREHENSIVE METABOLIC PANEL
ALT: 15 U/L (ref 0–44)
AST: 25 U/L (ref 15–41)
Albumin: 1.6 g/dL — ABNORMAL LOW (ref 3.5–5.0)
Alkaline Phosphatase: 99 U/L (ref 38–126)
Anion gap: 8 (ref 5–15)
BUN: 8 mg/dL (ref 6–20)
CO2: 17 mmol/L — ABNORMAL LOW (ref 22–32)
Calcium: 7.7 mg/dL — ABNORMAL LOW (ref 8.9–10.3)
Chloride: 113 mmol/L — ABNORMAL HIGH (ref 98–111)
Creatinine, Ser: 1.13 mg/dL — ABNORMAL HIGH (ref 0.44–1.00)
GFR calc Af Amer: >60 mL/min (ref >60)
GFR calc non Af Amer: >60 mL/min (ref >60)
Glucose, Bld: 84 mg/dL (ref 70–99)
Potassium: 3.5 mmol/L (ref 3.5–5.1)
Sodium: 138 mmol/L (ref 135–145)
Total Bilirubin: 0.9 mg/dL (ref 0.3–1.2)
Total Protein: 4.8 g/dL — ABNORMAL LOW (ref 6.5–8.1)

## 2018-09-20 LAB — LIPASE, BLOOD: Lipase: 33 U/L (ref 11–51)

## 2018-09-20 LAB — MAGNESIUM: MAGNESIUM: 1.5 mg/dL — AB (ref 1.7–2.4)

## 2018-09-20 MED ORDER — MAGNESIUM SULFATE 2 GM/50ML IV SOLN
2.0000 g | Freq: Once | INTRAVENOUS | Status: AC
  Administered 2018-09-20: 2 g via INTRAVENOUS
  Filled 2018-09-20: qty 50

## 2018-09-20 MED ORDER — POTASSIUM CHLORIDE 10 MEQ/100ML IV SOLN
10.0000 meq | INTRAVENOUS | Status: AC
  Administered 2018-09-20 (×4): 10 meq via INTRAVENOUS
  Filled 2018-09-20 (×4): qty 100

## 2018-09-20 NOTE — Progress Notes (Signed)
Patient vomited this morning x1, small and yellow in color.

## 2018-09-20 NOTE — Progress Notes (Signed)
PROGRESS NOTE  Dominique [name] OLM:[id number] DOB: [birth year] DOA: 09/19/2018 PCP: Benito Mccreedy, MD  HPI/Recap of past 24 hours:   Diet changed to clear liquid per GI recommendation, vomited once  Ask for pain meds for pain from chronic wounds, she refused dressing changes due to pain  Has odor in room due to chronic wounds/poor hygiene  No fever  Assessment/Plan: Principal Problem:   Acute pancreatitis Active Problems:   Morbid obesity with body mass index of 70 and over in adult [facility])   Orthostatic hypotension   Crohn's colitis with perforation s/p left colectomy/colostomy 2016   OSA on CPAP   Chronic anemia   Colostomy care ([place])   ARF (acute renal failure) (H[facility])   Intractable vomiting  Abdominal pain n/v (presenting symptom) -CT ab on presentation with "New small fluid collection in the tail the pancreas is concern for focal pancreatitis of the tail the pancreas." but lipase is not elevated -continue have n/v, not able to tolerate diet, Case discussed with [facility] Dr Paulita Fujita who will see patient in consult  Hypokalemia/hypomagnesemia:  Likely from poor oral intake, n/v Replace, repeat labs  CKDII Cr fluctuating from 0.9 to 1.13, monitor renal function, renal dosing meds   H/o Crohn's colitis s/p colostomy  Urinary incontinence  Moisture related skin damages Skin irritation at intertriginous region with significant pain ( multiple location, under breast, back, bilateral groin, sacral) Chronic abdominal wounds Was seen by general surgery during last admission for this , recommended  Outpatient dermatology referral  Wound care consulted on 12/25, input appreciated Wound care and pain control is challenge, may benefit from LTAC or LTC facility if insurance approves   H/o POTS; on florinef and midorine, home meds betablocker held due to borderline bp  Morbid obesity /OSA/cpap Body mass index is 71.15 kg/m.    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  Home health arranged by case management." LTAC is an option?   Code Status: full  Family Communication: patient   Disposition Plan: pending   Consultants:  [facility] Dr Paulita Fujita  Procedures:  none  Antibiotics:  none   Objective: BP 113/73 (BP Location: Left Arm)   Pulse (!) 121   Temp 97.7 F (36.5 C) (Oral)   Resp 20   Ht 5' 2"  (1.575 m)   Wt (!) 176.4 kg   SpO2 99%   BMI 71.15 kg/m   Intake/Output Summary (Last 24 hours) at 09/20/2018 1050 Last data filed at 09/19/2018 1800 Gross per 24 hour  Intake 360 ml  Output 800 ml  Net -440 ml   Filed Weights   09/16/18 0549 09/17/18 0500 09/18/18 0548  Weight: 132 kg (!) 171.9 kg (!) 176.4 kg    Exam: Patient is examined daily including today on 09/20/2018, exams remain the same as of yesterday except that has changed    General:  Obese, sitting up on bedside commode , chronic intertriginous wounds, very painful to light touch  Cardiovascular: RRR  Respiratory: CTABL  Abdomen: Soft/ND/NT, positive BS, + colostomy with semi liquid stool  Musculoskeletal: No Edema  Neuro: alert, oriented   Data Reviewed: Basic Metabolic Panel:  Recent Labs  Lab 08/23/2018 1612 09/16/18 0150 09/17/18 0500 09/20/18 0837  NA 135 137 136 138  K 4.3 3.9 4.0 3.5  CL 106 109 110 113*  CO2 20* 20* 16* 17*  GLUCOSE 103* 90 72 84  BUN 12 10 7 8   CREATININE 1.11* 0.94 0.91 1.13*  CALCIUM 8.2* 7.5* 7.6* 7.7*  MG  --   --  1.7 1.5*   Liver Function Tests: Recent Labs  Lab 09/09/2018 1612 09/16/18 0150 09/17/18 0500 09/20/18 0837  AST 24 19 24 25   ALT 16 15 15  15   ALKPHOS 134* 114 117 99  BILITOT 1.0 0.9 0.9 0.9  PROT 5.9* 5.1* 5.3* 4.8*  ALBUMIN 1.8* 1.6* 1.8* 1.6*   Recent Labs  Lab 09/05/2018 1612 09/16/18 0150 09/17/18 0500 09/20/18 0837  LIPASE 26 24 26  33  AMYLASE  --   --  16*  --    No results for input(s): AMMONIA in the last 168 hours. CBC: Recent Labs  Lab 08/24/2018 1612 09/16/18 0150 09/17/18 0500 09/20/18 0837  WBC 11.7* 10.5 11.0* 11.4*  NEUTROABS  --  6.7 7.5 8.6*  HGB 13.4 11.9* 12.4 11.4*  HCT 40.5 37.2 38.3 33.3*  MCV 83.2 85.1 84.7 79.1*  PLT 289 193 246 266   Cardiac Enzymes:   Recent Labs  Lab 08/28/2018 1612  TROPONINI <0.03   BNP (last 3 results) No results for input(s): BNP in the last 8760 hours.  ProBNP (last 3 results) No results for input(s): PROBNP in the last 8760 hours.  CBG: No results for input(s): GLUCAP in the last 168 hours.  No results found for this or any previous visit (from the past 240 hour(s)).   Studies: No results found.  Scheduled Meds: . DULoxetine  20 mg Oral Daily  . famotidine  20 mg Oral BID  . ferrous gluconate  324 mg Oral BID WC  . fludrocortisone  0.1 mg Oral Daily  . ketorolac  15 mg Intravenous Q6H  . midodrine  5 mg Oral BID WC  . multivitamin with minerals  1 tablet Oral Daily  . pantoprazole  40 mg Oral BID AC  . polyethylene glycol  17 g Oral Daily  . prednisoLONE acetate  1 drop Left Eye Q6H  . senna-docusate  1 tablet Oral BID    Continuous Infusions: . magnesium sulfate 1 - 4 g bolus IVPB    . potassium chloride       Time spent: 10mns,  I have personally reviewed and interpreted on  09/20/2018 daily labs, tele strips, imagings as discussed above under date review session and assessment and plans.  I reviewed all nursing notes, pharmacy notes, consultant notes,  vitals, pertinent old records  I have discussed plan of care as described above with RN , patient  on 09/20/2018   FFlorencia ReasonsMD, PhD  [facility] Pager [phone] If  7PM-7AM, please contact night-coverage at [email or web address], password [email or web address]12/29/2019, 10:50 AM  LOS: 4 days

## 2018-09-21 DIAGNOSIS — L304 Erythema intertrigo: Secondary | ICD-10-CM

## 2018-09-21 LAB — MAGNESIUM
Magnesium: 1.8 mg/dL (ref 1.7–2.4)
Magnesium: 1.8 mg/dL (ref 1.7–2.4)

## 2018-09-21 LAB — CBC WITH DIFFERENTIAL/PLATELET
ABS IMMATURE GRANULOCYTES: 0.17 10*3/uL — AB (ref 0.00–0.07)
Basophils Absolute: 0.1 10*3/uL (ref 0.0–0.1)
Basophils Relative: 1 %
Eosinophils Absolute: 0.2 10*3/uL (ref 0.0–0.5)
Eosinophils Relative: 2 %
HCT: 31.6 % — ABNORMAL LOW (ref 36.0–46.0)
Hemoglobin: 11.1 g/dL — ABNORMAL LOW (ref 12.0–15.0)
IMMATURE GRANULOCYTES: 1 %
LYMPHS ABS: 1.3 10*3/uL (ref 0.7–4.0)
Lymphocytes Relative: 10 %
MCH: 27.5 pg (ref 26.0–34.0)
MCHC: 35.1 g/dL (ref 30.0–36.0)
MCV: 78.4 fL — ABNORMAL LOW (ref 80.0–100.0)
Monocytes Absolute: 1.4 10*3/uL — ABNORMAL HIGH (ref 0.1–1.0)
Monocytes Relative: 10 %
NEUTROS PCT: 76 %
Neutro Abs: 10 10*3/uL — ABNORMAL HIGH (ref 1.7–7.7)
Platelets: 265 10*3/uL (ref 150–400)
RBC: 4.03 MIL/uL (ref 3.87–5.11)
RDW: 15.7 % — ABNORMAL HIGH (ref 11.5–15.5)
WBC: 13.1 10*3/uL — ABNORMAL HIGH (ref 4.0–10.5)
nRBC: 0 % (ref 0.0–0.2)

## 2018-09-21 LAB — BASIC METABOLIC PANEL
Anion gap: 8 (ref 5–15)
Anion gap: 7 (ref 5–15)
BUN: 8 mg/dL (ref 6–20)
BUN: 9 mg/dL (ref 6–20)
CO2: 18 mmol/L — ABNORMAL LOW (ref 22–32)
CO2: 19 mmol/L — AB (ref 22–32)
Calcium: 7.8 mg/dL — ABNORMAL LOW (ref 8.9–10.3)
Calcium: 7.7 mg/dL — ABNORMAL LOW (ref 8.9–10.3)
Chloride: 112 mmol/L — ABNORMAL HIGH (ref 98–111)
Chloride: 110 mmol/L (ref 98–111)
Creatinine, Ser: 1.13 mg/dL — ABNORMAL HIGH (ref 0.44–1.00)
Creatinine, Ser: 1.2 mg/dL — ABNORMAL HIGH (ref 0.44–1.00)
GFR calc Af Amer: >60 mL/min (ref >60)
GFR calc Af Amer: >60 mL/min (ref >60)
GFR calc non Af Amer: >60 mL/min (ref >60)
GFR calc non Af Amer: 57 mL/min — ABNORMAL LOW (ref >60)
Glucose, Bld: 86 mg/dL (ref 70–99)
Glucose, Bld: 117 mg/dL — ABNORMAL HIGH (ref 70–99)
POTASSIUM: 3.3 mmol/L — AB (ref 3.5–5.1)
Potassium: 4.3 mmol/L (ref 3.5–5.1)
Sodium: 138 mmol/L (ref 135–145)
Sodium: 136 mmol/L (ref 135–145)

## 2018-09-21 LAB — CBC
HEMATOCRIT: 33.2 % — AB (ref 36.0–46.0)
HEMOGLOBIN: 11 g/dL — AB (ref 12.0–15.0)
MCH: 26.6 pg (ref 26.0–34.0)
MCHC: 33.1 g/dL (ref 30.0–36.0)
MCV: 80.2 fL (ref 80.0–100.0)
Platelets: 292 10*3/uL (ref 150–400)
RBC: 4.14 MIL/uL (ref 3.87–5.11)
RDW: 16.3 % — ABNORMAL HIGH (ref 11.5–15.5)
WBC: 15.8 10*3/uL — ABNORMAL HIGH (ref 4.0–10.5)
nRBC: 0 % (ref 0.0–0.2)

## 2018-09-21 NOTE — Progress Notes (Signed)
Subjective: The patient was seen and examined at bedside. She is morbidly obese and lying on bed. Has been able to tolerate clear liquids for the last 24 hours without nausea or vomiting and wants her diet to be advanced.  Objective: Vital signs in last 24 hours: Temp:  [98 F (36.7 C)-98.2 F (36.8 C)] 98 F (36.7 C) (12/30 0515) Pulse Rate:  [85-115] 115 (12/30 0515) Resp:  [16-20] 20 (12/30 0515) BP: (105-122)/(66-71) 108/68 (12/30 0515) SpO2:  [94 %-97 %] 97 % (12/30 0515) Weight change:  Last BM Date: 09/20/18  KG:SUPJSRPR obese,erythematous right eye GENERAL:foul-smelling discharge from multiple abdominal and intertriginous areas from chronic nonhealing wounds ABDOMEN:right sided ostomy draining semi-liquid/semisolid green stool EXTREMITIES:obese extremities  Lab Results: Results for orders placed or performed during the hospital encounter of 09/05/2018 (from the past 48 hour(s))  CBC with Differential/Platelet     Status: Abnormal   Collection Time: 09/20/18  8:37 AM  Result Value Ref Range   WBC 11.4 (H) 4.0 - 10.5 K/uL   RBC 4.21 3.87 - 5.11 MIL/uL   Hemoglobin 11.4 (L) 12.0 - 15.0 g/dL   HCT 33.3 (L) 36.0 - 46.0 %   MCV 79.1 (L) 80.0 - 100.0 fL   MCH 27.1 26.0 - 34.0 pg   MCHC 34.2 30.0 - 36.0 g/dL   RDW 15.9 (H) 11.5 - 15.5 %   Platelets 266 150 - 400 K/uL   nRBC 0.0 0.0 - 0.2 %   Neutrophils Relative % 77 %   Neutro Abs 8.6 (H) 1.7 - 7.7 K/uL   Lymphocytes Relative 9 %   Lymphs Abs 1.1 0.7 - 4.0 K/uL   Monocytes Relative 10 %   Monocytes Absolute 1.2 (H) 0.1 - 1.0 K/uL   Eosinophils Relative 3 %   Eosinophils Absolute 0.3 0.0 - 0.5 K/uL   Basophils Relative 0 %   Basophils Absolute 0.1 0.0 - 0.1 K/uL   Immature Granulocytes 1 %   Abs Immature Granulocytes 0.14 (H) 0.00 - 0.07 K/uL    Comment: Performed at [facility], [place] [address] [place], [place] [address]  Comprehensive metabolic panel     Status: Abnormal   Collection Time:  09/20/18  8:37 AM  Result Value Ref Range   Sodium 138 135 - 145 mmol/L   Potassium 3.5 3.5 - 5.1 mmol/L   Chloride 113 (H) 98 - 111 mmol/L   CO2 17 (L) 22 - 32 mmol/L   Glucose, Bld 84 70 - 99 mg/dL   BUN 8 6 - 20 mg/dL   Creatinine, Ser 1.13 (H) 0.44 - 1.00 mg/dL   Calcium 7.7 (L) 8.9 - 10.3 mg/dL   Total Protein 4.8 (L) 6.5 - 8.1 g/dL   Albumin 1.6 (L) 3.5 - 5.0 g/dL   AST 25 15 - 41 U/L   ALT 15 0 - 44 U/L   Alkaline Phosphatase 99 38 - 126 U/L   Total Bilirubin 0.9 0.3 - 1.2 mg/dL   GFR calc non Af Amer >60 >60 mL/min   GFR calc Af Amer >60 >60 mL/min   Anion gap 8 5 - 15    Comment: Performed at [facility], [place] [address], [place], [place] [address]  Magnesium     Status: Abnormal   Collection Time: 09/20/18  8:37 AM  Result Value Ref Range   Magnesium 1.5 (L) 1.7 - 2.4 mg/dL    Comment: Performed at [facility], [address] [place], [place]  [address]  Lipase, blood     Status: None   Collection Time: 09/20/18  8:37 AM  Result Value Ref Range   Lipase 33 11 - 51 U/L    Comment: Performed at [facility], [place] [address] [place], [place] [address]    Studies/Results: No results found.  Medications: I have reviewed the patient's current medications.  Assessment: 1. History of Crohn's disease, status post total colectomy and right-sided ileostomy 2. Peristomal hernia containing nonobstructed small bowel 3. Nausea vomiting 4. Nonhealing chronic abdominal and intertriginous areas 5. 2.41.5 cm new fluid collection within tail of pancreas with mild peripancreatic fat inflammation with normal lipase and no abdominal pain 6.orthostatic hypotension 7. Morbid obesity  Plan: Stomach, duodenum and small bowel appear normal on recent CAT scan,CRP of 7.1 and ESR of 1 Has not been on any treatment for Crohn's disease for over a year, used to be on Remicade, no signs of active Crohn's noted on recent  imaging.  Ileostomy draining semi-liquid green stool without evidence of obstruction.Patient is on senna twice a day and MiraLAX once a day  Nausea and vomiting has subsided, advance diet to full liquids, minimize use of narcotics if possible, patient currently on oxycodone 15 mg every 4 hours when necessary along with Dilaudid 0.5 mg IV every 3 hours for severe pain  Recommend follow instructions from the wound care.  Clinically, there are no features of pancreatitis, recommend advance diet to full liquids. Patient will benefit from [facility]  facility placement.  Recommend a PT/OT evaluation   Ronnette Juniper 09/21/2018, 8:20 AM   Pager [phone] If no answer or after 5 PM call [phone]

## 2018-09-21 NOTE — Evaluation (Signed)
Physical Therapy Evaluation Patient Details Name: Dominique [name] MRN: [id number] DOB: 1980-10-30 Today's Date: 09/21/2018   History of Present Illness  37 yo female admitted with weakness, dizziness, possible acute pancreatitis. Hx of morbid obesity, chronic intertrigo complicated by bacterial infection and poor healing, colectomy, keratoplasty cornea 08/27/18.   Clinical Impression  Limited eval on today due to pt self-limiting progression of activity. MOD encouragement for participation/OOB activity. It took over an hour for pt to get out of bed, transfer to [facility], then return to bed. Recommend daily mobility with nursing staff to increase activity, in addition to PT sessions. Will follow and progress activity as pt will allow.     Follow Up Recommendations SNF (depending on progress. HHPT if pt progresses well AND has appropriate assistance available)    Equipment Recommendations  Rolling walker with 5" wheels;3in1 (PT)    Recommendations for Other Services       Precautions / Restrictions Precautions Precautions: Fall Precaution Comments: colostomy Restrictions Weight Bearing Restrictions: No      Mobility  Bed Mobility Overal bed mobility: Needs Assistance Bed Mobility: Supine to Sit;Sit to Sidelying     Supine to sit: Mod assist   Sit to sidelying: Min assist General bed mobility comments: Assist for trunk and LEs. Pt relies heavily on bedrail. Increased time. Encouragement required as well.   Transfers Overall transfer level: Needs assistance Equipment used: Rolling walker (2 wheeled) Transfers: Sit to/from [facility] Sit to Stand: Min guard Stand pivot transfers: Min guard       General transfer comment: Increased time. Close guard for safety.   Ambulation/Gait             General Gait Details: NT-pt not willing on today.   Stairs            Wheelchair Mobility    Modified Rankin (Stroke Patients Only)       Balance                                              Pertinent Vitals/Pain Pain Assessment: Faces Faces Pain Scale: Hurts whole lot Pain Location: wounds Pain Descriptors / Indicators: Crying;Discomfort Pain Intervention(s): Limited activity within patient's tolerance;Monitored during session    [place] expects to be discharged to:: Unsure Living Arrangements: Parent;Children Available Help at Discharge: Family;Available PRN/intermittently Type of Home: Apartment Home Access: Stairs to enter Entrance Stairs-Rails: Can reach both Entrance Stairs-Number of Steps: 4 Home Layout: One level Home Equipment: None Additional Comments: lives with father who is on dialysis and cannot assist her. Has 40 y o daughter, whom her mother will take care of initially.  Brother and sister live in same apt complex    Prior Function Level of Independence: Needs assistance   Gait / Transfers Assistance Needed: ambulatory           Hand Dominance        Extremity/Trunk Assessment   Upper Extremity Assessment Upper Extremity Assessment: Defer to OT evaluation    Lower Extremity Assessment Lower Extremity Assessment: Generalized weakness    Cervical / Trunk Assessment Cervical / Trunk Assessment: Normal  Communication   Communication: No difficulties  Cognition Arousal/Alertness: Awake/alert Behavior During Therapy: WFL for tasks assessed/performed Overall Cognitive Status: Within Functional Limits for tasks assessed  General Comments: very slow moving       General Comments      Exercises     Assessment/Plan    PT Assessment Patient needs continued PT services  PT Problem List Decreased activity tolerance;Decreased balance;Decreased mobility;Decreased skin integrity;Pain       PT Treatment Interventions Gait training;DME instruction;Functional mobility training;Therapeutic activities;Therapeutic  exercise;Balance training;Patient/family education    PT Goals (Current goals can be found in the Care Plan section)  Acute Rehab PT Goals Patient Stated Goal: for wounds to heal PT Goal Formulation: With patient Time For Goal Achievement: 10/05/18 Potential to Achieve Goals: Fair    Frequency Min 2X/week   Barriers to discharge        Co-evaluation               AM-PAC PT "6 Clicks" Mobility  Outcome Measure Help needed turning from your back to your side while in a flat bed without using bedrails?: A Little Help needed moving from lying on your back to sitting on the side of a flat bed without using bedrails?: A Lot Help needed moving to and from a bed to a chair (including a wheelchair)?: A Little Help needed standing up from a chair using your arms (e.g., wheelchair or bedside chair)?: A Little Help needed to walk in hospital room?: A Little Help needed climbing 3-5 steps with a railing? : A Lot 6 Click Score: 16    End of Session   Activity Tolerance: Patient limited by pain;Patient limited by fatigue Patient left: in bed;with call bell/phone within reach   PT Visit Diagnosis: Muscle weakness (generalized) (M62.81);Pain;Unsteadiness on feet (R26.81) Pain - part of body: (wounds on trunk)    Time: 9179-1505 PT Time Calculation (min) (ACUTE ONLY): 81 min   Charges:   PT Evaluation $PT Eval Moderate Complexity: 1 Mod PT Treatments $Therapeutic Activity: 8-22 mins         Weston Anna, PT Acute Rehabilitation Services Pager: [phone] Office: [phone]

## 2018-09-21 NOTE — Evaluation (Signed)
Occupational Therapy Evaluation Patient Details Name: Dominique [name] MRN: [id number] DOB: 05/18/1981 Today's Date: 09/21/2018    History of Present Illness 37 yo female admitted with weakness, dizziness, possible acute pancreatitis. Hx of morbid obesity, chronic intertrigo complicated by bacterial infection and poor healing, colectomy, keratoplasty cornea 08/27/18.    Clinical Impression   Pt with decline in function and safety with ADLs and ADL mobility with decreased strength and endurance. Pt is limited by pain form wounds and became tearful during session. Pt requires increased time and effort to complete tasks, moves very slowly, however participated as much as possible. Pt participated in toileting tasks on [facility]. Pt would benefit from acute OT services to address impairments to maximize level of function and safety    Follow Up Recommendations  SNF(vs LTAC)    Equipment Recommendations  Other (comment)(TBD at next venue of care)    Recommendations for Other Services       Precautions / Restrictions Precautions Precautions: Fall Precaution Comments: colostomy Restrictions Weight Bearing Restrictions: No      Mobility Bed Mobility Overal bed mobility: Needs Assistance Bed Mobility: Supine to Sit;Sit to Sidelying     Supine to sit: Mod assist   Sit to sidelying: Min assist General bed mobility comments: Assist for trunk and LEs. Pt relies heavily on bedrail. Increased time. Encouragement required as well.   Transfers Overall transfer level: Needs assistance Equipment used: Rolling walker (2 wheeled) Transfers: Sit to/from [facility] Sit to Stand: Min guard Stand pivot transfers: Min guard       General transfer comment: Increased time. Close guard for safety.     Balance Overall balance assessment: Needs assistance Sitting-balance support: No upper extremity supported;Feet supported Sitting balance-Leahy Scale: Good     Standing balance  support: During functional activity;Single extremity supported;Bilateral upper extremity supported Standing balance-Leahy Scale: Fair                             ADL either performed or assessed with clinical judgement   ADL Overall ADL's : Needs assistance/impaired     Grooming: Wash/dry hands;Wash/dry face;Set up;Sitting   Upper Body Bathing: Set up;Supervision/ safety;Sitting   Lower Body Bathing: Maximal assistance   Upper Body Dressing : Supervision/safety;Set up;Sitting   Lower Body Dressing: Total assistance   Toilet Transfer: Ambulation;Comfort height toilet;Grab bars;Min guard   Toileting- Clothing Manipulation and Hygiene: Moderate assistance;Sitting/lateral lean;Sit to/from stand       Functional mobility during ADLs: Min guard General ADL Comments: pt has A/E at home     Vision Patient Visual Report: No change from baseline       Perception     Praxis      Pertinent Vitals/Pain Pain Assessment: Faces Faces Pain Scale: Hurts whole lot Pain Location: wounds Pain Descriptors / Indicators: Crying;Discomfort Pain Intervention(s): Limited activity within patient's tolerance;Monitored during session;RN gave pain meds during session     Hand Dominance Right   Extremity/Trunk Assessment Upper Extremity Assessment Upper Extremity Assessment: Generalized weakness   Lower Extremity Assessment Lower Extremity Assessment: Defer to PT evaluation   Cervical / Trunk Assessment Cervical / Trunk Assessment: Normal   Communication Communication Communication: No difficulties   Cognition Arousal/Alertness: Awake/alert Behavior During Therapy: WFL for tasks assessed/performed Overall Cognitive Status: Within Functional Limits for tasks assessed  General Comments: very slow moving    General Comments       Exercises     Shoulder Instructions      Home Living Family/patient expects to be discharged  to:: Unsure Living Arrangements: Parent;Children Available Help at Discharge: Family;Available PRN/intermittently Type of Home: Apartment Home Access: Stairs to enter Entrance Stairs-Number of Steps: 4 Entrance Stairs-Rails: Can reach both Home Layout: One level     Bathroom Shower/Tub: Teacher, early years/pre: Standard     Home Equipment: None   Additional Comments: lives with father who is on dialysis and cannot assist her. Has 10 y o daughter, whom her mother will take care of initially.  Brother and sister live in same apt complex      Prior Functioning/Environment Level of Independence: Needs assistance  Gait / Transfers Assistance Needed: ambulatory ADL's / Homemaking Assistance Needed: pt repost that she was independent with selfcare PTA   Comments: lives with father who goes to dialysis 3x/week, and 61 y/o daughter        OT Problem List: Decreased strength;Decreased activity tolerance;Pain;Impaired balance (sitting and/or standing);Decreased knowledge of use of DME or AE;Obesity      OT Treatment/Interventions: Self-care/ADL training;DME and/or AE instruction;Patient/family education;Balance training;Therapeutic activities    OT Goals(Current goals can be found in the care plan section) Acute Rehab OT Goals Patient Stated Goal: for wounds to heal OT Goal Formulation: With patient Time For Goal Achievement: 10/05/18 Potential to Achieve Goals: Good  OT Frequency: Min 2X/week   Barriers to D/C: Decreased caregiver support          Co-evaluation              AM-PAC OT "6 Clicks" Daily Activity     Outcome Measure Help from another person eating meals?: None Help from another person taking care of personal grooming?: A Little Help from another person toileting, which includes using toliet, bedpan, or urinal?: A Lot Help from another person bathing (including washing, rinsing, drying)?: A Lot Help from another person to put on and taking off  regular upper body clothing?: A Little Help from another person to put on and taking off regular lower body clothing?: Total 6 Click Score: 15   End of Session Equipment Utilized During Treatment: Other (comment);Rolling walker([facility])  Activity Tolerance: Patient limited by pain Patient left: with call bell/phone within reach;in bed  OT Visit Diagnosis: Unsteadiness on feet (R26.81);Muscle weakness (generalized) (M62.81);Pain Pain - part of body: (mid - low back, wound areas)                Time: 1102-1117 OT Time Calculation (min): 81 min Charges:  OT General Charges $OT Visit: 1 Visit OT Evaluation $OT Eval Moderate Complexity: 1 Mod OT Treatments $Self Care/Home Management : 8-22 mins    Britt Bottom 09/21/2018, 2:18 PM

## 2018-09-21 NOTE — Progress Notes (Signed)
PROGRESS NOTE  Dominique [name] TMH:[id number] DOB: 04/04/1981 DOA: 09/08/2018 PCP: Benito Mccreedy, MD  HPI/Recap of past 24 hours:  Tolerating clears, no vomiting, denies ab pain, diet advanced to full liquid per gi  Active ongoing issues are multiple chronic wounds that is very painfull, need aggressive wound care and pain control   Has odor in room due to chronic wounds/poor hygiene  No fever  Case discussed with case manager, it has appears that snf declined her, insurance does not cover LTAC, she can not go home  Assessment/Plan: Principal Problem:   Acute pancreatitis Active Problems:   Morbid obesity with body mass index of 70 and over in adult [facility])   Orthostatic hypotension   Crohn's colitis with perforation s/p left colectomy/colostomy 2016   OSA on CPAP   Chronic anemia   Colostomy care ([place])   ARF (acute renal failure) (HCC)   Intractable vomiting   Dizzy   Other chronic pain   FTT (failure to thrive) in adult  Abdominal pain n/v (presenting symptom) -CT ab on presentation with "New small fluid collection in the tail the pancreas is concern for focal pancreatitis of the tail the pancreas." but lipase is not elevated -[facility] consulted due to persistent  N/v, per GI  Avoid narcotics if possible -will follow gi recommendation, gi input appreciated  Hypokalemia/hypomagnesemia:  Likely from poor oral intake, n/v Replaced and normalized  CKDII Cr fluctuating from 0.9 to 1.13, monitor renal function, renal dosing meds   H/o Crohn's colitis s/p colostomy Est 1, crp 7  Urinary incontinence  Moisture related skin damages Skin irritation at intertriginous region with significant pain ( multiple location, under breast, back, bilateral groin, sacral) Chronic abdominal wounds Was seen by general surgery during last admission for this , recommended  Outpatient dermatology referral  Wound care consulted on 12/25, input appreciated Wound care and pain control  is challenge, may benefit from LTAC or LTC facility if insurance approves , per case manager LTAC /LTC is not an option.   H/o POTS; on florinef and midorine, home meds betablocker held due to borderline bp  Morbid obesity /OSA/cpap Body mass index is 71.15 kg/m.    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 maintained on continue prednisolone  Outpatient follow-up with surgeon in clinic at [facility] in 1 to 2 weeks  Poor iv access with poor oral intake Now has one pic May need  midline placement vs picc,   FTT: frequent hospitalizations Not able to go home due to the level of care she needed, snf declined her, not a candidate for LTAC/LTC per case manager   Code Status: full  Family Communication: patient   Disposition Plan: pending Difficult disposition    Consultants:  [facility]   Procedures:  none  Antibiotics:  none   Objective: BP 108/68 (BP Location: Right Arm)   Pulse (!) 115   Temp 98 F (36.7 C) (Oral)   Resp 20   Ht 5' 2"  (1.575 m)   Wt (!) 176.4 kg   SpO2 97%   BMI 71.15 kg/m   Intake/Output Summary (Last 24 hours) at 09/21/2018 1229 Last data filed at 09/20/2018 1531 Gross per 24 hour  Intake 421.94 ml  Output -  Net 421.94 ml   Filed Weights   09/16/18 0549 09/17/18 0500 09/18/18 0548  Weight: 132 kg (!) 171.9 kg (!) 176.4 kg    Exam: Patient is examined daily including today on 09/21/2018, exams remain the same as  of yesterday except that has changed    General:  Obese, multiple chronic intertriginous wounds, very painful to light touch or with wound care  Cardiovascular: RRR  Respiratory: CTABL  Abdomen: Soft/ND/NT, positive BS, + colostomy with semi liquid stool  Musculoskeletal: No Edema  Neuro: alert, oriented   Data Reviewed: Basic Metabolic Panel: Recent Labs  Lab 09/19/2018 1612 09/16/18 0150 09/17/18 0500 09/20/18 0837 09/21/18 0730  NA 135 137 136 138 138  K 4.3 3.9 4.0 3.5 4.3    CL 106 109 110 113* 112*  CO2 20* 20* 16* 17* 18*  GLUCOSE 103* 90 72 84 86  BUN 12 10 7 8 8   CREATININE 1.11* 0.94 0.91 1.13* 1.13*  CALCIUM 8.2* 7.5* 7.6* 7.7* 7.8*  MG  --   --  1.7 1.5* 1.8   Liver Function Tests: Recent Labs  Lab 08/26/2018 1612 09/16/18 0150 09/17/18 0500 09/20/18 0837  AST 24 19 24 25   ALT 16 15 15 15   ALKPHOS 134* 114 117 99  BILITOT 1.0 0.9 0.9 0.9  PROT 5.9* 5.1* 5.3* 4.8*  ALBUMIN 1.8* 1.6* 1.8* 1.6*   Recent Labs  Lab 08/31/2018 1612 09/16/18 0150 09/17/18 0500 09/20/18 0837  LIPASE 26 24 26  33  AMYLASE  --   --  16*  --    No results for input(s): AMMONIA in the last 168 hours. CBC: Recent Labs  Lab 09/14/2018 1612 09/16/18 0150 09/17/18 0500 09/20/18 0837 09/21/18 0828  WBC 11.7* 10.5 11.0* 11.4* 13.1*  NEUTROABS  --  6.7 7.5 8.6* 10.0*  HGB 13.4 11.9* 12.4 11.4* 11.1*  HCT 40.5 37.2 38.3 33.3* 31.6*  MCV 83.2 85.1 84.7 79.1* 78.4*  PLT 289 193 246 266 265   Cardiac Enzymes:   Recent Labs  Lab 09/13/2018 1612  TROPONINI <0.03   BNP (last 3 results) No results for input(s): BNP in the last 8760 hours.  ProBNP (last 3 results) No results for input(s): PROBNP in the last 8760 hours.  CBG: No results for input(s): GLUCAP in the last 168 hours.  No results found for this or any previous visit (from the past 240 hour(s)).   Studies: No results found.  Scheduled Meds: . DULoxetine  20 mg Oral Daily  . famotidine  20 mg Oral BID  . ferrous gluconate  324 mg Oral BID WC  . fludrocortisone  0.1 mg Oral Daily  . ketorolac  15 mg Intravenous Q6H  . midodrine  5 mg Oral BID WC  . multivitamin with minerals  1 tablet Oral Daily  . pantoprazole  40 mg Oral BID AC  . polyethylene glycol  17 g Oral Daily  . prednisoLONE acetate  1 drop Left Eye Q6H  . senna-docusate  1 tablet Oral BID    Continuous Infusions:    Time spent: 44mns, case discussed with case manager I have personally reviewed and interpreted on   09/21/2018 daily labs, tele strips, imagings as discussed above under date review session and assessment and plans.  I reviewed all nursing notes, pharmacy notes, consultant notes,  vitals, pertinent old records  I have discussed plan of care as described above with RN , patient  on 09/21/2018   FFlorencia ReasonsMD, PhD  [facility] Pager [phone] If 7PM-7AM, please contact night-coverage at [email or web address], password [email or web address]12/30/2019, 12:29 PM  LOS: 5 days

## 2018-09-21 NOTE — Progress Notes (Signed)
Patient does not have a good support system @ home-her father goes to HD qod-unable to asst w/patient's care,patient has a minor child @ home. Her sister comes by to asst periodically.Patient had [facility] in the past-they are unable to accept back since no other caregiver to asst w/wound care & patient cannot asst w/wound care. Patient is not a candidate for [facility] or LTAC-has community medicaid. Dc plan SNF.

## 2018-09-22 NOTE — Progress Notes (Signed)
Pt refused CPAP qhs.  Pt education provided but still refused CPAP for tonight.  Pt encouraged to contact RT should she change her mind.

## 2018-09-22 NOTE — Progress Notes (Addendum)
Wound care (anterior) done by patient with moderate assistance and moderate amount of instruction by Probation officer. Mid flank/posterior wound care performed by writer, as patient is unable to reach them. Foul odor noted. Time spent was an hour for dressing change. Bed linen changed. Donne Hazel, RN

## 2018-09-22 NOTE — Progress Notes (Addendum)
Subjective: Patient seen and examined at bedside. Complains of nausea and does not want her diet to be advanced beyond clear  Liquid diet.  Objective: Vital signs in last 24 hours: Temp:  [97.7 F (36.5 C)-98.6 F (37 C)] 97.7 F (36.5 C) (12/31 0527) Pulse Rate:  [106-117] 117 (12/31 0527) Resp:  [10-18] 18 (12/31 0527) BP: (120-144)/(70-87) 126/87 (12/31 0527) SpO2:  [96 %-100 %] 100 % (12/31 0527) Weight:  [165.1 kg] 165.1 kg (12/31 0527) Weight change:  Last BM Date: 09/20/18  FB:PZWCHENI obese GENERAL:erythematous right eye ABDOMEN:multiple skin wounds with chronic drainage especially in intertriginous region EXTREMITIES:no deformity  Lab Results: Results for orders placed or performed during the hospital encounter of 09/12/2018 (from the past 48 hour(s))  Basic metabolic panel     Status: Abnormal   Collection Time: 09/21/18  7:30 AM  Result Value Ref Range   Sodium 138 135 - 145 mmol/L   Potassium 4.3 3.5 - 5.1 mmol/L    Comment: DELTA CHECK NOTED REPEATED TO VERIFY NO VISIBLE HEMOLYSIS    Chloride 112 (H) 98 - 111 mmol/L   CO2 18 (L) 22 - 32 mmol/L   Glucose, Bld 86 70 - 99 mg/dL   BUN 8 6 - 20 mg/dL   Creatinine, Ser 1.13 (H) 0.44 - 1.00 mg/dL   Calcium 7.8 (L) 8.9 - 10.3 mg/dL   GFR calc non Af Amer >60 >60 mL/min   GFR calc Af Amer >60 >60 mL/min   Anion gap 8 5 - 15    Comment: Performed at [facility], [place] [address], [place], [place] [address]  Magnesium     Status: None   Collection Time: 09/21/18  7:30 AM  Result Value Ref Range   Magnesium 1.8 1.7 - 2.4 mg/dL    Comment: Performed at [facility], [place] [address], [place], [place] [address]  CBC with Differential/Platelet     Status: Abnormal   Collection Time: 09/21/18  8:28 AM  Result Value Ref Range   WBC 13.1 (H) 4.0 - 10.5 K/uL   RBC 4.03 3.87 - 5.11 MIL/uL   Hemoglobin 11.1 (L) 12.0 - 15.0 g/dL   HCT 31.6 (L) 36.0 - 46.0 %   MCV 78.4 (L) 80.0 - 100.0 fL    MCH 27.5 26.0 - 34.0 pg   MCHC 35.1 30.0 - 36.0 g/dL   RDW 15.7 (H) 11.5 - 15.5 %   Platelets 265 150 - 400 K/uL   nRBC 0.0 0.0 - 0.2 %   Neutrophils Relative % 76 %   Neutro Abs 10.0 (H) 1.7 - 7.7 K/uL   Lymphocytes Relative 10 %   Lymphs Abs 1.3 0.7 - 4.0 K/uL   Monocytes Relative 10 %   Monocytes Absolute 1.4 (H) 0.1 - 1.0 K/uL   Eosinophils Relative 2 %   Eosinophils Absolute 0.2 0.0 - 0.5 K/uL   Basophils Relative 1 %   Basophils Absolute 0.1 0.0 - 0.1 K/uL   Immature Granulocytes 1 %   Abs Immature Granulocytes 0.17 (H) 0.00 - 0.07 K/uL    Comment: Performed at [facility], [place] [address], [place], [place] [address]  CBC     Status: Abnormal   Collection Time: 09/21/18  9:10 PM  Result Value Ref Range   WBC 15.8 (H) 4.0 - 10.5 K/uL   RBC 4.14 3.87 - 5.11 MIL/uL   Hemoglobin 11.0 (L) 12.0 - 15.0 g/dL   HCT 33.2 (L) 36.0 - 46.0 %  MCV 80.2 80.0 - 100.0 fL   MCH 26.6 26.0 - 34.0 pg   MCHC 33.1 30.0 - 36.0 g/dL   RDW 16.3 (H) 11.5 - 15.5 %   Platelets 292 150 - 400 K/uL   nRBC 0.0 0.0 - 0.2 %    Comment: Performed at [facility], [place] [address] [place], [place] [address]  Basic metabolic panel     Status: Abnormal   Collection Time: 09/21/18  9:10 PM  Result Value Ref Range   Sodium 136 135 - 145 mmol/L   Potassium 3.3 (L) 3.5 - 5.1 mmol/L    Comment: DELTA CHECK NOTED NO VISIBLE HEMOLYSIS    Chloride 110 98 - 111 mmol/L   CO2 19 (L) 22 - 32 mmol/L   Glucose, Bld 117 (H) 70 - 99 mg/dL   BUN 9 6 - 20 mg/dL   Creatinine, Ser 1.20 (H) 0.44 - 1.00 mg/dL   Calcium 7.7 (L) 8.9 - 10.3 mg/dL   GFR calc non Af Amer 57 (L) >60 mL/min   GFR calc Af Amer >60 >60 mL/min   Anion gap 7 5 - 15    Comment: Performed at [facility], [place] [address] [place], [place] [address]  Magnesium     Status: None   Collection Time: 09/21/18  9:10 PM  Result Value Ref Range   Magnesium 1.8 1.7 - 2.4 mg/dL    Comment: Performed  at [facility], [place] [address], [place], [place] [address]    Studies/Results: No results found.  Medications: I have reviewed the patient's current medications.  Assessment: Nausea likely multifactorial-narcotic usage, ongoing skin infection and drainage,fluid collection in tail of pancreas,possible gastroparesis worsened with narcotics.  Although patient has history of Crohn's colitis, she has an ileostomy and recent imaging does not show active disease  Plan: Unfortunately, her symptoms are chronic and she has multiple comorbidities to cause nausea Recommend advance diet as tolerated, minimize use of narcotics,continue PPI BID. No further recommendations from GI, please recall if needed.  Ronnette Juniper 09/22/2018, 1:26 PM   Pager [phone] If no answer or after 5 PM call [phone]

## 2018-09-22 NOTE — Progress Notes (Signed)
CSW department familiar with patient due to having recent lengthy stay at hospital. During previous admission, department unable to find SNF placement due to multiple barriers. CSW discussed patient with Surveyor, quantity and patient is unable to be placed into SNF at this time.   Please read previous admissions notes for additional information.   Dominique [name], [place]  [phone]

## 2018-09-22 NOTE — Progress Notes (Signed)
[facility] rep Evalee Jefferson were scheduled to start [facility] services for patient on 12/11 they attempted to contact patient-no answer until 12/19-they scheduled a home visit for 12/20-Nurse came & patient's BP was low-Nurse called EMS. They need confirmation that patient will have good support @ home if they can provide HHC. CM spoke to patient about her d/c plans-patient does not feel shee will have good support @ home-her sister works,& does not have a car,her Dad goes to HD Q[employer],& there is a minor child @ home. Attending says patient needs SNF. Patient has not been able to apply for LTC through medicaid. Currently patient has community medicaid.Will continue to follow for d/c plans.

## 2018-09-22 NOTE — Progress Notes (Signed)
PROGRESS NOTE  Dominique [name] [id number]:[id number] DOB: [birth year] DOA: 09/11/2018 PCP: Benito Mccreedy, MD  HPI/Recap of past 24 hours:  On full liquid,  Vomited x1 this am, denies ab pain,   Active ongoing issues are multiple chronic wounds that is very painfull, need aggressive wound care and pain control   No fever  Case discussed with case manager, it has appears that she is not a candidate for snf or LTAC due to lack of insurance ,she still require high level of nursing care due to multiple wounds,  It takes at least one hour for dressing changes each time.   Assessment/Plan: Principal Problem:   Acute pancreatitis Active Problems:   Morbid obesity with body mass index of 70 and over in adult [facility])   Orthostatic hypotension   Crohn's colitis with perforation s/p left colectomy/colostomy 2016   OSA on CPAP   Chronic anemia   Colostomy care ([place])   ARF (acute renal failure) (HCC)   Intractable vomiting   Dizzy   Other chronic pain   FTT (failure to thrive) in adult   Intertriginous dermatitis associated with moisture  Abdominal pain/ n/v (presenting symptom) -CT ab on presentation with "New small fluid collection in the tail the pancreas is concern for focal pancreatitis of the tail the pancreas." but lipase is not elevated -[facility] consulted due to persistent  N/v, per GI  Minimize narcotics if possible -will follow gi recommendation, gi input appreciated  Hypokalemia/hypomagnesemia:  Likely from poor oral intake, n/v Replaced and normalized  CKDII Cr fluctuating from 0.9 to 1.13, monitor renal function, renal dosing meds   H/o Crohn's colitis s/p colostomy Est 1, crp 7  Urinary incontinence  Moisture related skin damages Skin irritation at intertriginous region with significant pain ( multiple location, under breast, back, bilateral groin, sacral) Chronic abdominal wounds Was seen by general surgery during last admission for this , recommended  Outpatient  dermatology referral  Wound care consulted on 12/25, input appreciated Wound care and pain control is challenge, not a candidate for LTAC or SNF due to lack on insurance.    H/o POTS; on florinef and midorine, home meds betablocker held due to borderline bp  Morbid obesity /OSA/cpap Body mass index is 66.58 kg/m.    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 maintained on continue prednisolone  Outpatient follow-up with surgeon in clinic at [facility] in 1 to 2 weeks  Poor iv access with poor oral intake Now has one pic May need  midline placement vs picc,   FTT: frequent hospitalizations Not able to go home due to the level of care she needed, snf declined her, not a candidate for LTAC/LTC per case manager   Code Status: full  Family Communication: patient   Disposition Plan: still needs high level of nursing care for extensive wound Difficult disposition , has no insurance    Consultants:  [facility]   Procedures:  none  Antibiotics:  none   Objective: BP 126/87 (BP Location: Left Arm)   Pulse (!) 117   Temp 97.7 F (36.5 C) (Oral)   Resp 18   Ht 5' 2"  (1.575 m)   Wt (!) 165.1 kg   SpO2 100%   BMI 66.58 kg/m   Intake/Output Summary (Last 24 hours) at 09/22/2018 1057 Last data filed at 09/22/2018 0946 Gross per 24 hour  Intake 300 ml  Output 500 ml  Net -200 ml   Filed Weights   09/17/18 0500 09/18/18 0548 09/22/18  6712  Weight: (!) 171.9 kg (!) 176.4 kg (!) 165.1 kg    Exam: Patient is examined daily including today on 09/22/2018, exams remain the same as of yesterday except that has changed    General:  Obese, multiple chronic intertriginous wounds, very painful to light touch or with wound care  Cardiovascular: RRR  Respiratory: CTABL  Abdomen: Soft/ND/NT, positive BS, + colostomy with semi liquid stool  Musculoskeletal: No Edema  Neuro: alert, oriented   Data Reviewed: Basic Metabolic Panel: Recent Labs   Lab 09/16/18 0150 09/17/18 0500 09/20/18 0837 09/21/18 0730 09/21/18 2110  NA 137 136 138 138 136  K 3.9 4.0 3.5 4.3 3.3*  CL 109 110 113* 112* 110  CO2 20* 16* 17* 18* 19*  GLUCOSE 90 72 84 86 117*  BUN 10 7 8 8 9   CREATININE 0.94 0.91 1.13* 1.13* 1.20*  CALCIUM 7.5* 7.6* 7.7* 7.8* 7.7*  MG  --  1.7 1.5* 1.8 1.8   Liver Function Tests: Recent Labs  Lab 09/17/2018 1612 09/16/18 0150 09/17/18 0500 09/20/18 0837  AST 24 19 24 25   ALT 16 15 15 15   ALKPHOS 134* 114 117 99  BILITOT 1.0 0.9 0.9 0.9  PROT 5.9* 5.1* 5.3* 4.8*  ALBUMIN 1.8* 1.6* 1.8* 1.6*   Recent Labs  Lab 09/12/2018 1612 09/16/18 0150 09/17/18 0500 09/20/18 0837  LIPASE 26 24 26  33  AMYLASE  --   --  16*  --    No results for input(s): AMMONIA in the last 168 hours. CBC: Recent Labs  Lab 09/16/18 0150 09/17/18 0500 09/20/18 0837 09/21/18 0828 09/21/18 2110  WBC 10.5 11.0* 11.4* 13.1* 15.8*  NEUTROABS 6.7 7.5 8.6* 10.0*  --   HGB 11.9* 12.4 11.4* 11.1* 11.0*  HCT 37.2 38.3 33.3* 31.6* 33.2*  MCV 85.1 84.7 79.1* 78.4* 80.2  PLT 193 246 266 265 292   Cardiac Enzymes:   Recent Labs  Lab 09/13/2018 1612  TROPONINI <0.03   BNP (last 3 results) No results for input(s): BNP in the last 8760 hours.  ProBNP (last 3 results) No results for input(s): PROBNP in the last 8760 hours.  CBG: No results for input(s): GLUCAP in the last 168 hours.  No results found for this or any previous visit (from the past 240 hour(s)).   Studies: No results found.  Scheduled Meds: . DULoxetine  20 mg Oral Daily  . famotidine  20 mg Oral BID  . ferrous gluconate  324 mg Oral BID WC  . fludrocortisone  0.1 mg Oral Daily  . ketorolac  15 mg Intravenous Q6H  . midodrine  5 mg Oral BID WC  . multivitamin with minerals  1 tablet Oral Daily  . pantoprazole  40 mg Oral BID AC  . polyethylene glycol  17 g Oral Daily  . prednisoLONE acetate  1 drop Left Eye Q6H  . senna-docusate  1 tablet Oral BID    Continuous  Infusions:    Time spent: 57mns, case discussed with case manager and RN at bedside I have personally reviewed and interpreted on  09/22/2018 daily labs, imagings as discussed above under date review session and assessment and plans.  I reviewed all nursing notes, pharmacy notes, consultant notes,  vitals, pertinent old records  I have discussed plan of care as described above with RN , patient  on 09/22/2018   FFlorencia ReasonsMD, PhD  [facility] Pager [phone] If 7PM-7AM, please contact night-coverage at [email or web address], password [email or web address]12/31/2019, 10:57 AM  LOS: 6 days

## 2018-09-23 LAB — BASIC METABOLIC PANEL
Anion gap: 7 (ref 5–15)
BUN: 16 mg/dL (ref 6–20)
CO2: 16 mmol/L — ABNORMAL LOW (ref 22–32)
Calcium: 7.4 mg/dL — ABNORMAL LOW (ref 8.9–10.3)
Chloride: 108 mmol/L (ref 98–111)
Creatinine, Ser: 2.34 mg/dL — ABNORMAL HIGH (ref 0.44–1.00)
GFR, EST AFRICAN AMERICAN: 30 mL/min — AB (ref >60)
GFR, EST NON AFRICAN AMERICAN: 26 mL/min — AB (ref >60)
Glucose, Bld: 133 mg/dL — ABNORMAL HIGH (ref 70–99)
Potassium: 4.3 mmol/L (ref 3.5–5.1)
Sodium: 131 mmol/L — ABNORMAL LOW (ref 135–145)

## 2018-09-23 LAB — MAGNESIUM: Magnesium: 1.7 mg/dL (ref 1.7–2.4)

## 2018-09-23 MED ORDER — SODIUM CHLORIDE 0.9 % IV BOLUS
1000.0000 mL | Freq: Once | INTRAVENOUS | Status: AC
  Administered 2018-09-23: 1000 mL via INTRAVENOUS

## 2018-09-23 MED ORDER — ENOXAPARIN SODIUM 40 MG/0.4ML ~~LOC~~ SOLN
40.0000 mg | Freq: Every day | SUBCUTANEOUS | Status: DC
  Administered 2018-09-23 – 2018-09-25 (×3): 40 mg via SUBCUTANEOUS
  Filled 2018-09-23 (×5): qty 0.4

## 2018-09-23 MED ORDER — SODIUM CHLORIDE 0.9 % IV BOLUS
500.0000 mL | Freq: Once | INTRAVENOUS | Status: AC
  Administered 2018-09-23: 500 mL via INTRAVENOUS

## 2018-09-23 MED ORDER — IPRATROPIUM-ALBUTEROL 0.5-2.5 (3) MG/3ML IN SOLN
3.0000 mL | Freq: Four times a day (QID) | RESPIRATORY_TRACT | Status: DC
  Administered 2018-09-23: 3 mL via RESPIRATORY_TRACT
  Filled 2018-09-23: qty 3

## 2018-09-23 MED ORDER — IPRATROPIUM-ALBUTEROL 0.5-2.5 (3) MG/3ML IN SOLN
3.0000 mL | Freq: Four times a day (QID) | RESPIRATORY_TRACT | Status: DC | PRN
  Administered 2018-09-23: 3 mL via RESPIRATORY_TRACT
  Filled 2018-09-23: qty 3

## 2018-09-23 NOTE — Progress Notes (Signed)
Per Dr.Matthews, may give Dilaudid 0.65m IV dose early to do dressing change. All dressings to abdomen, folds and lower mid abdomen changed, including interdry.

## 2018-09-23 NOTE — Progress Notes (Addendum)
PROGRESS NOTE    Dominique [name]  KAJ:[id number] DOB: Jan 23, 1981 DOA: 08/25/2018 PCP: Benito Mccreedy, MD    Brief Narrative:38 y.o. female with history of Crohn's disease status post colectomy with diverting ostomy was recently admitted for extended stay after patient was found to have hypotension abdominal wounds and possible POTS was placed on midodrine and also with abdominal pain patient was placed on PPI and Pepcid had keratoplasty of the left cornea at [facility] and was transferred back to the hospital and discharge 10 days ago presents back to the ER because of increasing weakness over the last 24 hours feeling dizzy on standing and also having increasing abdominal pain mostly in the epigastric and left upper quadrant.  Denies nausea vomiting or diarrhea.  Has not lost consciousness or did not have any chest pain or shortness of breath.  ED Course: In the ER patient was found to be mildly hypotensive with elevated lactate and leukocytosis.  CAT scan of the abdomen shows possibility of pancreatitis.  Labs show leukocytosis.  Creatinine has worsened from 0.7-1.1.  Patient was given fluid bolus and pain relief medication admitted for further management of hypotension abdominal pain likely from pancreatitis.  Generalized weakness could be from hypotension and dehydration.   Assessment & Plan:   Principal Problem:   Acute pancreatitis Active Problems:   Morbid obesity with body mass index of 70 and over in adult [facility])   Orthostatic hypotension   Crohn's colitis with perforation s/p left colectomy/colostomy 2016   OSA on CPAP   Chronic anemia   Colostomy care ([facility])   ARF (acute renal failure) (HCC)   Intractable vomiting   Dizzy   Other chronic pain   FTT (failure to thrive) in adult   Intertriginous dermatitis associated with moisture   #1 pancreatitis patient presented with complaints of abdominal pain nausea and vomiting.  Lipase was normal.  CT showed small fluid collection of the  tail of the pancreas concerning for pancreatitis of the tail of the pancreas.  Because of ongoing nausea GI was consulted.  Their recommendation was to minimize narcotics to avoid gastroparesis.  Advance diet as tolerated minimize use of narcotics continue PPI twice daily.   #2 CKD stage II  #3 history of Crohn's colitis status post ileostomy  #4 multiple moisture associated skin damage at the intertriginous areas with significant pain with chronic abdominal wounds.  Seen by general surgery last admission recommended dermatology evaluation.  Wound care consulted 09/16/2018.  Because of her extensive wounds and draining she needs dressing changes daily.  Patient does not have insurance She lives at home with her father who is on dialysis and her daughter who is 3 years old.  Patient has had frequent hospitalization due to the extensive level of care she needs.  She has been declined by SNF long-term care.  #5 keratoconus left corneal perforation status post keratoplasty 08/27/2018.  Continue prednisolone drops.  Outpatient follow-up with the surgeon at [facility] in 1 to 2 weeks.  #6 morbid obesity  #7 chronic hypotension on midodrine and Florinef.  #8 AKI unclear creatinine jumped from 1.2 to 2.3.wil increase IVF.follow labs in am       Nutrition Problem: Increased nutrient needs Etiology: wound healing     Signs/Symptoms: estimated needs    Interventions: MVI, Refer to RD note for recommendations  Estimated body mass index is 66.57 kg/m as calculated from the following:   Height as of this encounter: 5' 2"  (1.575 m).   Weight as of this  encounter: 165.1 kg.  DVT prophylaxis: We will start Lovenox Code Status: Full code Family Communication: No family available Disposition Plan: Because of high intensity of care needed she cannot be discharged home safely she has frequent hospitalization from multiple chronic wounds  Consultants:  GI  Procedures: None Antimicrobials  none  Subjective: She is resting in bed agrees to try soft food today no nausea vomiting reported.  Objective: Vitals:   09/22/18 1400 09/22/18 2159 09/23/18 0507 09/23/18 0608  BP: (!) 99/53 99/64 (!) 82/47 (!) 95/47  Pulse: (!) 112 (!) 107 85 80  Resp: 16 18 15    Temp: 98.8 F (37.1 C) 97.7 F (36.5 C) (!) 97.5 F (36.4 C)   TempSrc:  Oral Oral   SpO2: 93% 95% 97%   Weight:   (!) 165.1 kg   Height:        Intake/Output Summary (Last 24 hours) at 09/23/2018 0940 Last data filed at 09/23/2018 0506 Gross per 24 hour  Intake 760 ml  Output 250 ml  Net 510 ml   Filed Weights   09/18/18 0548 09/22/18 0527 09/23/18 0507  Weight: (!) 176.4 kg (!) 165.1 kg (!) 165.1 kg    Examination:  General exam: Appears calm and comfortable  Respiratory system: Clear to auscultation. Respiratory effort normal. Cardiovascular system: S1 & S2 heard, RRR. No JVD, murmurs, rubs, gallops or clicks. No pedal edema. Gastrointestinal system: Abdomen is nondistended, soft and nontender. No organomegaly or masses felt. Normal bowel sounds heard. Central nervous system: Alert and oriented. No focal neurological deficits. Extremities: Symmetric 5 x 5 power. Skin multiple draining wounds along the intertriginous areas with foul odor Psychiatry: Judgement and insight appear normal. Mood & affect appropriate.     Data Reviewed: I have personally reviewed following labs and imaging studies  CBC: Recent Labs  Lab 09/17/18 0500 09/20/18 0837 09/21/18 0828 09/21/18 2110  WBC 11.0* 11.4* 13.1* 15.8*  NEUTROABS 7.5 8.6* 10.0*  --   HGB 12.4 11.4* 11.1* 11.0*  HCT 38.3 33.3* 31.6* 33.2*  MCV 84.7 79.1* 78.4* 80.2  PLT 246 266 265 295   Basic Metabolic Panel: Recent Labs  Lab 09/17/18 0500 09/20/18 0837 09/21/18 0730 09/21/18 2110  NA 136 138 138 136  K 4.0 3.5 4.3 3.3*  CL 110 113* 112* 110  CO2 16* 17* 18* 19*  GLUCOSE 72 84 86 117*  BUN 7 8 8 9   CREATININE 0.91 1.13* 1.13* 1.20*   CALCIUM 7.6* 7.7* 7.8* 7.7*  MG 1.7 1.5* 1.8 1.8   GFR: Estimated Creatinine Clearance: 96.4 mL/min (A) (by C-G formula based on SCr of 1.2 mg/dL (H)). Liver Function Tests: Recent Labs  Lab 09/17/18 0500 09/20/18 0837  AST 24 25  ALT 15 15  ALKPHOS 117 99  BILITOT 0.9 0.9  PROT 5.3* 4.8*  ALBUMIN 1.8* 1.6*   Recent Labs  Lab 09/17/18 0500 09/20/18 0837  LIPASE 26 33  AMYLASE 16*  --    No results for input(s): AMMONIA in the last 168 hours. Coagulation Profile: No results for input(s): INR, PROTIME in the last 168 hours. Cardiac Enzymes: No results for input(s): CKTOTAL, CKMB, CKMBINDEX, TROPONINI in the last 168 hours. BNP (last 3 results) No results for input(s): PROBNP in the last 8760 hours. HbA1C: No results for input(s): HGBA1C in the last 72 hours. CBG: No results for input(s): GLUCAP in the last 168 hours. Lipid Profile: No results for input(s): CHOL, HDL, LDLCALC, TRIG, CHOLHDL, LDLDIRECT in the last 72 hours.  Thyroid Function Tests: No results for input(s): TSH, T4TOTAL, FREET4, T3FREE, THYROIDAB in the last 72 hours. Anemia Panel: No results for input(s): VITAMINB12, FOLATE, FERRITIN, TIBC, IRON, RETICCTPCT in the last 72 hours. Sepsis Labs: Recent Labs  Lab 09/17/18 0500  LATICACIDVEN 1.7    No results found for this or any previous visit (from the past 240 hour(s)).       Radiology Studies: No results found.      Scheduled Meds: . DULoxetine  20 mg Oral Daily  . famotidine  20 mg Oral BID  . ferrous gluconate  324 mg Oral BID WC  . fludrocortisone  0.1 mg Oral Daily  . midodrine  5 mg Oral BID WC  . multivitamin with minerals  1 tablet Oral Daily  . pantoprazole  40 mg Oral BID AC  . polyethylene glycol  17 g Oral Daily  . prednisoLONE acetate  1 drop Left Eye Q6H  . senna-docusate  1 tablet Oral BID   Continuous Infusions:   LOS: 7 days     Georgette Shell, MD [facility]  If 7PM-7AM, please contact  night-coverage [email or web address] Password [email or web address] 09/23/2018, 9:40 AM

## 2018-09-23 NOTE — Progress Notes (Signed)
Patient requesting Albuterol PRN breathing treatment. Patient states she uses this PRN at home and prior hospitalizations. Paged Dr.Matthews.

## 2018-09-23 NOTE — Progress Notes (Signed)
Pt's B/P 72/45,73/41,71/29,HR-91. Paged  On call Baltazar Najjar NP. Received order to give bolus NS 589m and bladder scan and check B/P manually  . Administered NS 500 ml NS ,Bladder scanned showed 1376mand manually B/P-80/50. Updated  Recent vital signs to NP again. Received new order to Bolus NS 100075mnd some stat labs. Pt is alert and oriented x4. Infusing1000 NS bolus now. Will continue to monitor.

## 2018-09-23 DEATH — deceased

## 2018-09-24 ENCOUNTER — Inpatient Hospital Stay (HOSPITAL_COMMUNITY): Payer: Medicaid Other

## 2018-09-24 ENCOUNTER — Encounter (HOSPITAL_COMMUNITY): Payer: Self-pay

## 2018-09-24 DIAGNOSIS — E872 Acidosis, unspecified: Secondary | ICD-10-CM

## 2018-09-24 DIAGNOSIS — R6521 Severe sepsis with septic shock: Secondary | ICD-10-CM

## 2018-09-24 DIAGNOSIS — R571 Hypovolemic shock: Secondary | ICD-10-CM

## 2018-09-24 DIAGNOSIS — K8500 Idiopathic acute pancreatitis without necrosis or infection: Secondary | ICD-10-CM

## 2018-09-24 DIAGNOSIS — A419 Sepsis, unspecified organism: Secondary | ICD-10-CM

## 2018-09-24 DIAGNOSIS — R6510 Systemic inflammatory response syndrome (SIRS) of non-infectious origin without acute organ dysfunction: Secondary | ICD-10-CM

## 2018-09-24 DIAGNOSIS — N179 Acute kidney failure, unspecified: Secondary | ICD-10-CM

## 2018-09-24 DIAGNOSIS — K650 Generalized (acute) peritonitis: Secondary | ICD-10-CM

## 2018-09-24 LAB — CBC
HEMATOCRIT: 33.6 % — AB (ref 36.0–46.0)
Hemoglobin: 10.9 g/dL — ABNORMAL LOW (ref 12.0–15.0)
MCH: 26.7 pg (ref 26.0–34.0)
MCHC: 32.4 g/dL (ref 30.0–36.0)
MCV: 82.2 fL (ref 80.0–100.0)
Platelets: 186 10*3/uL (ref 150–400)
RBC: 4.09 MIL/uL (ref 3.87–5.11)
RDW: 16.3 % — ABNORMAL HIGH (ref 11.5–15.5)
WBC: 38.4 10*3/uL — ABNORMAL HIGH (ref 4.0–10.5)
nRBC: 0 % (ref 0.0–0.2)

## 2018-09-24 LAB — CBC WITH DIFFERENTIAL/PLATELET
Abs Immature Granulocytes: 0.78 10*3/uL — ABNORMAL HIGH (ref 0.00–0.07)
Abs Immature Granulocytes: 0.92 10*3/uL — ABNORMAL HIGH (ref 0.00–0.07)
Basophils Absolute: 0.1 10*3/uL (ref 0.0–0.1)
Basophils Absolute: 0 10*3/uL (ref 0.0–0.1)
Basophils Relative: 0 %
Basophils Relative: 0 %
EOS PCT: 0 %
Eosinophils Absolute: 0.1 10*3/uL (ref 0.0–0.5)
Eosinophils Absolute: 0.1 10*3/uL (ref 0.0–0.5)
Eosinophils Relative: 0 %
HCT: 29.4 % — ABNORMAL LOW (ref 36.0–46.0)
HCT: 30.8 % — ABNORMAL LOW (ref 36.0–46.0)
Hemoglobin: 9.7 g/dL — ABNORMAL LOW (ref 12.0–15.0)
Hemoglobin: 10.4 g/dL — ABNORMAL LOW (ref 12.0–15.0)
Immature Granulocytes: 2 %
Immature Granulocytes: 2 %
Lymphocytes Relative: 3 %
Lymphocytes Relative: 2 %
Lymphs Abs: 1.1 10*3/uL (ref 0.7–4.0)
Lymphs Abs: 0.9 10*3/uL (ref 0.7–4.0)
MCH: 27.4 pg (ref 26.0–34.0)
MCH: 27.7 pg (ref 26.0–34.0)
MCHC: 33 g/dL (ref 30.0–36.0)
MCHC: 33.8 g/dL (ref 30.0–36.0)
MCV: 83.1 fL (ref 80.0–100.0)
MCV: 81.9 fL (ref 80.0–100.0)
MONO ABS: 1.2 10*3/uL — AB (ref 0.1–1.0)
MONOS PCT: 3 %
Monocytes Absolute: 1.2 10*3/uL — ABNORMAL HIGH (ref 0.1–1.0)
Monocytes Relative: 3 %
Neutro Abs: 32.7 10*3/uL — ABNORMAL HIGH (ref 1.7–7.7)
Neutro Abs: 38 10*3/uL — ABNORMAL HIGH (ref 1.7–7.7)
Neutrophils Relative %: 92 %
Neutrophils Relative %: 93 %
Platelets: 200 10*3/uL (ref 150–400)
Platelets: 196 10*3/uL (ref 150–400)
RBC: 3.54 MIL/uL — ABNORMAL LOW (ref 3.87–5.11)
RBC: 3.76 MIL/uL — ABNORMAL LOW (ref 3.87–5.11)
RDW: 16.7 % — ABNORMAL HIGH (ref 11.5–15.5)
RDW: 16.8 % — ABNORMAL HIGH (ref 11.5–15.5)
WBC: 35.9 10*3/uL — ABNORMAL HIGH (ref 4.0–10.5)
WBC: 41 10*3/uL — ABNORMAL HIGH (ref 4.0–10.5)
nRBC: 0 % (ref 0.0–0.2)
nRBC: 0 % (ref 0.0–0.2)

## 2018-09-24 LAB — BLOOD GAS, ARTERIAL
ACID-BASE DEFICIT: 10.4 mmol/L — AB (ref 0.0–2.0)
Bicarbonate: 15.6 mmol/L — ABNORMAL LOW (ref 20.0–28.0)
Drawn by: 270211
O2 Content: 3 L/min
O2 Saturation: 97.7 %
Patient temperature: 98.6
pCO2 arterial: 36.5 mmHg (ref 32.0–48.0)
pH, Arterial: 7.254 — ABNORMAL LOW (ref 7.350–7.450)
pO2, Arterial: 106 mmHg (ref 83.0–108.0)

## 2018-09-24 LAB — DIFFERENTIAL
Basophils Absolute: 0.1 10*3/uL (ref 0.0–0.1)
Basophils Relative: 0 %
Eosinophils Absolute: 0.3 10*3/uL (ref 0.0–0.5)
Eosinophils Relative: 1 %
LYMPHS PCT: 5 %
Lymphs Abs: 1.7 10*3/uL (ref 0.7–4.0)
Monocytes Absolute: 1.3 10*3/uL — ABNORMAL HIGH (ref 0.1–1.0)
Monocytes Relative: 3 %
NEUTROS ABS: 33.6 10*3/uL — AB (ref 1.7–7.7)
Neutrophils Relative %: 88 %

## 2018-09-24 LAB — LIPASE, BLOOD: Lipase: 19 U/L (ref 11–51)

## 2018-09-24 LAB — BASIC METABOLIC PANEL
Anion gap: 6 (ref 5–15)
BUN: 19 mg/dL (ref 6–20)
CALCIUM: 7 mg/dL — AB (ref 8.9–10.3)
CO2: 16 mmol/L — ABNORMAL LOW (ref 22–32)
Chloride: 111 mmol/L (ref 98–111)
Creatinine, Ser: 2.33 mg/dL — ABNORMAL HIGH (ref 0.44–1.00)
GFR, EST AFRICAN AMERICAN: 30 mL/min — AB (ref >60)
GFR, EST NON AFRICAN AMERICAN: 26 mL/min — AB (ref >60)
Glucose, Bld: 113 mg/dL — ABNORMAL HIGH (ref 70–99)
Potassium: 4.3 mmol/L (ref 3.5–5.1)
Sodium: 133 mmol/L — ABNORMAL LOW (ref 135–145)

## 2018-09-24 LAB — URINALYSIS, ROUTINE W REFLEX MICROSCOPIC
Bilirubin Urine: NEGATIVE
Glucose, UA: NEGATIVE mg/dL
Ketones, ur: NEGATIVE mg/dL
Nitrite: NEGATIVE
Protein, ur: 30 mg/dL — AB
Specific Gravity, Urine: 1.017 (ref 1.005–1.030)
pH: 5 (ref 5.0–8.0)

## 2018-09-24 LAB — LACTIC ACID, PLASMA
Lactic Acid, Venous: 1.4 mmol/L (ref 0.5–1.9)
Lactic Acid, Venous: 2.2 mmol/L (ref 0.5–1.9)
Lactic Acid, Venous: 3.1 mmol/L (ref 0.5–1.9)
Lactic Acid, Venous: 5.1 mmol/L (ref 0.5–1.9)

## 2018-09-24 LAB — MAGNESIUM: Magnesium: 1.5 mg/dL — ABNORMAL LOW (ref 1.7–2.4)

## 2018-09-24 LAB — COMPREHENSIVE METABOLIC PANEL
ALT: 13 U/L (ref 0–44)
AST: 19 U/L (ref 15–41)
Albumin: 1.2 g/dL — ABNORMAL LOW (ref 3.5–5.0)
Alkaline Phosphatase: 92 U/L (ref 38–126)
Anion gap: 7 (ref 5–15)
BUN: 16 mg/dL (ref 6–20)
CO2: 14 mmol/L — ABNORMAL LOW (ref 22–32)
Calcium: 6.7 mg/dL — ABNORMAL LOW (ref 8.9–10.3)
Chloride: 111 mmol/L (ref 98–111)
Creatinine, Ser: 2.3 mg/dL — ABNORMAL HIGH (ref 0.44–1.00)
GFR calc Af Amer: 30 mL/min — ABNORMAL LOW (ref >60)
GFR calc non Af Amer: 26 mL/min — ABNORMAL LOW (ref >60)
Glucose, Bld: 109 mg/dL — ABNORMAL HIGH (ref 70–99)
POTASSIUM: 3.7 mmol/L (ref 3.5–5.1)
Sodium: 132 mmol/L — ABNORMAL LOW (ref 135–145)
Total Bilirubin: 0.9 mg/dL (ref 0.3–1.2)
Total Protein: 3.8 g/dL — ABNORMAL LOW (ref 6.5–8.1)

## 2018-09-24 LAB — GLUCOSE, CAPILLARY: Glucose-Capillary: 89 mg/dL (ref 70–99)

## 2018-09-24 LAB — TRIGLYCERIDES: Triglycerides: 59 mg/dL (ref <150)

## 2018-09-24 MED ORDER — SODIUM CHLORIDE 0.9% FLUSH
10.0000 mL | Freq: Two times a day (BID) | INTRAVENOUS | Status: DC
  Administered 2018-09-24 – 2018-09-25 (×2): 10 mL
  Administered 2018-09-25: 40 mL
  Administered 2018-09-26 – 2018-09-27 (×2): 10 mL
  Administered 2018-09-27: 40 mL
  Administered 2018-09-28 (×2): 10 mL

## 2018-09-24 MED ORDER — SODIUM CHLORIDE 0.9% FLUSH
10.0000 mL | INTRAVENOUS | Status: DC | PRN
  Administered 2018-09-28: 10 mL
  Filled 2018-09-24: qty 40

## 2018-09-24 MED ORDER — PHENYLEPHRINE HCL-NACL 10-0.9 MG/250ML-% IV SOLN: 0.0000 ug/min | INTRAVENOUS | Status: DC

## 2018-09-24 MED ORDER — MIDODRINE HCL 5 MG PO TABS
10.0000 mg | ORAL_TABLET | Freq: Three times a day (TID) | ORAL | Status: DC
  Administered 2018-09-24 – 2018-09-26 (×6): 10 mg via ORAL
  Filled 2018-09-24 (×6): qty 2

## 2018-09-24 MED ORDER — ALBUMIN HUMAN 25 % IV SOLN
12.5000 g | Freq: Once | INTRAVENOUS | Status: AC
  Administered 2018-09-24: 12.5 g via INTRAVENOUS
  Filled 2018-09-24: qty 50

## 2018-09-24 MED ORDER — SODIUM CHLORIDE 0.9 % IV SOLN
INTRAVENOUS | Status: DC
  Administered 2018-09-24 – 2018-09-27 (×7): via INTRAVENOUS

## 2018-09-24 MED ORDER — NOREPINEPHRINE 4 MG/250ML-% IV SOLN
0.0000 ug/min | INTRAVENOUS | Status: DC
  Administered 2018-09-24: 2 ug/min via INTRAVENOUS
  Filled 2018-09-24: qty 250

## 2018-09-24 MED ORDER — SODIUM CHLORIDE 0.9 % IV SOLN
2.0000 g | Freq: Two times a day (BID) | INTRAVENOUS | Status: DC
  Administered 2018-09-24 – 2018-09-26 (×6): 2 g via INTRAVENOUS
  Filled 2018-09-24 (×7): qty 2

## 2018-09-24 MED ORDER — VANCOMYCIN HCL 10 G IV SOLR
2500.0000 mg | Freq: Once | INTRAVENOUS | Status: AC
  Administered 2018-09-24: 2500 mg via INTRAVENOUS
  Filled 2018-09-24: qty 500

## 2018-09-24 MED ORDER — MAGNESIUM SULFATE 2 GM/50ML IV SOLN
2.0000 g | Freq: Once | INTRAVENOUS | Status: AC
  Administered 2018-09-24: 2 g via INTRAVENOUS
  Filled 2018-09-24: qty 50

## 2018-09-24 MED ORDER — CHLORHEXIDINE GLUCONATE CLOTH 2 % EX PADS
6.0000 | MEDICATED_PAD | Freq: Every day | CUTANEOUS | Status: DC
  Administered 2018-09-25 – 2018-09-29 (×4): 6 via TOPICAL

## 2018-09-24 MED ORDER — LACTATED RINGERS IV BOLUS
1000.0000 mL | Freq: Once | INTRAVENOUS | Status: AC
  Administered 2018-09-24: 1000 mL via INTRAVENOUS

## 2018-09-24 MED ORDER — SODIUM CHLORIDE 0.9 % IV BOLUS
2000.0000 mL | Freq: Once | INTRAVENOUS | Status: AC
  Administered 2018-09-24: 2000 mL via INTRAVENOUS

## 2018-09-24 MED ORDER — METRONIDAZOLE IN NACL 5-0.79 MG/ML-% IV SOLN
500.0000 mg | Freq: Three times a day (TID) | INTRAVENOUS | Status: DC
  Administered 2018-09-24 – 2018-09-29 (×16): 500 mg via INTRAVENOUS
  Filled 2018-09-24 (×16): qty 100

## 2018-09-24 MED ORDER — ORAL CARE MOUTH RINSE
15.0000 mL | Freq: Two times a day (BID) | OROMUCOSAL | Status: DC
  Administered 2018-09-24 – 2018-09-28 (×7): 15 mL via OROMUCOSAL

## 2018-09-24 MED ORDER — ALBUMIN HUMAN 25 % IV SOLN
25.0000 g | Freq: Once | INTRAVENOUS | Status: AC
  Administered 2018-09-24: 25 g via INTRAVENOUS
  Filled 2018-09-24: qty 50

## 2018-09-24 MED ORDER — SODIUM CHLORIDE 0.9 % IV SOLN
INTRAVENOUS | Status: DC | PRN
  Administered 2018-09-27: 16:00:00 via INTRA_ARTERIAL

## 2018-09-24 MED ORDER — JEVITY 1.5 CAL PO LIQD: 1000.0000 mL | ORAL | Status: DC

## 2018-09-24 MED ORDER — SODIUM BICARBONATE 8.4 % IV SOLN
50.0000 meq | Freq: Once | INTRAVENOUS | Status: AC
  Administered 2018-09-24: 50 meq via INTRAVENOUS

## 2018-09-24 MED ORDER — VANCOMYCIN HCL 10 G IV SOLR
1500.0000 mg | INTRAVENOUS | Status: DC
  Administered 2018-09-26: 1500 mg via INTRAVENOUS
  Filled 2018-09-24: qty 1500

## 2018-09-24 MED ORDER — MIDODRINE HCL 5 MG PO TABS
10.0000 mg | ORAL_TABLET | Freq: Two times a day (BID) | ORAL | Status: DC
  Administered 2018-09-24: 10 mg via ORAL

## 2018-09-24 MED ORDER — HYDROCORTISONE NA SUCCINATE PF 100 MG IJ SOLR
100.0000 mg | Freq: Three times a day (TID) | INTRAMUSCULAR | Status: DC
  Administered 2018-09-24 – 2018-09-25 (×5): 100 mg via INTRAVENOUS
  Filled 2018-09-24 (×5): qty 2

## 2018-09-24 NOTE — Care Management Note (Signed)
Case Management Note  Patient Details  Name: Dominique [name] MRN: [id number] Date of Birth: 1981/05/27  Subjective/Objective:      sepsis             Discharge readiness is indicated by patient meeting Recovery Milestones, including ALL of the following: ? Hemodynamic stability  No iv pressors a.line for monitoring ? Fever absent or reduced 98.1 ? Hypoxemia absent  Nasal cannula ? Mental status at baseline no ? End-organ dysfunction (eg, myocardial ischemia, renal failure) absent lactic acid 3.1 ? Metabolic derangement (eg, dehydration, acidosis) absent  no ? Cultures negative or infection identified and under adequate treatment  abd wound showing GPC ? Ambulatory no ? Oral hydration, medications[P] ? iuv neosynphrine,iv maxipime, iv flagyl, iv vancox2 doses ?    Action/Plan: Will follow for progression of care and clinical status. Will follow for case management needs none present at this time.  Expected Discharge Date:  (unknown)               Expected Discharge Plan:  Short  In-House Referral:  Clinical Social Work  Discharge planning Services  CM Consult  Post Acute Care Choice:    Choice offered to:  Patient  DME Arranged:    DME Agency:     HH Arranged:  RN, PT, OT HH Agency:  [facility]  Status of Service:  In process, will continue to follow  If discussed at Long Length of Stay Meetings, dates discussed:    Additional Comments:  Leeroy Cha, RN 09/24/2018, 9:00 AM

## 2018-09-24 NOTE — H&P (Signed)
Patient refuses CPAP 

## 2018-09-24 NOTE — Progress Notes (Signed)
Pt transferred to ICU. Called pt's mother Roselee Culver) to give updates about pt. no answered,could not left message. Also called pt's sister Willette Brace. No answered , left voice mail.

## 2018-09-24 NOTE — Progress Notes (Signed)
Wound dressing changes not performed this shift. Materials needed were not available and pt preferred waiting until a later time when they could receive pain medication prior to changing them.

## 2018-09-24 NOTE — Consult Note (Signed)
[facility] Surgery Consult Note  Dominique [name] 12/17/1980  [id number].    Requesting MD: Landis Gandy Chief Complaint/Reason for Consult: abdominal pain  HPI:  Dominique [name] is a 38yo female PMH morbid obesity, chronic hypotension, CKD-II, and h/o Crohns colitis s/p exploratory laparotomy, drainage intra-abdominal abscess, partial colectomy, colostomy 05/2015 by Dr. Grandville Silos for a perforated descending colon, and s/p total colectomy/ileostomy 2018 at [facility], who was admitted to [facility] 12/24 with concerns for possible pancreatitis despite normal enzymes. 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 Last night the patient became more hypotensive and was complaining of worsening RUQ abdominal pain and SOB. Found to have lactic acidosis 5.1 and leukocytosis WBC 38.4. CT scan abdomen/pelvis revealed new ascites about the liver is higher in attenuation than the liver itself, raising suspicion for hemorrhage or complex fluid. Patient was transferred to the ICU and started on cefepime, flagyl, and vancomycin. BP somewhat improved with IVF, and now she is getting started on phenylephrine and stress dose steroids.  General surgery asked to see. This morning Ms. Blas states that she is feeling much better. Abdominal pain nearly resolved. Denies n/v. Having stool output from ileostomy.   ROS: Review of Systems  Constitutional: Negative for chills and fever.  HENT: Negative.   Eyes: Positive for pain and redness.  Respiratory: Positive for shortness of breath.   Cardiovascular: Negative.   Gastrointestinal: Positive for abdominal pain. Negative for blood in stool, constipation, diarrhea, melena, nausea and vomiting.  Genitourinary: Negative.   Musculoskeletal: Negative.   Skin:       Abdominal wounds  Neurological: Negative.    All systems reviewed and otherwise negative except for as above  Family History  Problem Relation Age of Onset  .  Hypertension Mother   . Allergies Mother   . Hypertension Father   . Cancer Paternal Grandmother        lung  . Hypertension Sister   . Asthma Brother   . Diabetes Maternal Grandmother   . Hypertension Maternal Grandmother   . Hypertension Maternal Grandfather   . Emphysema Paternal Grandfather     Past Medical History:  Diagnosis Date  . Acute acalculous cholecystitis 11/16/2014  . Anal fistula   . Anemia   . Anxiety   . Asthma   . CHF (congestive heart failure) ([place])   . Complication of anesthesia    states she had a problem staying awake after her c-section, was [facility]  . Coronary artery disease   . Diverticulitis of colon 11/17/2014  . Headache    used to have migraines  . Hepatic steatosis 11/17/2014  . Herpes   . Hypertension   . Morbid obesity ([place]) 03/22/2009   Qualifier: Diagnosis of  By: Ronnald Ramp CMA, [place]    . Neuropathy   . OSA (obstructive sleep apnea) 05/02/2014  . Seasonal allergies 06/13/2014  . Sickle cell anemia ([place])    She states she " has the trait" (07/20/15)  . Sleep apnea    uses Bipap  . Thyromegaly 05/02/2014    Past Surgical History:  Procedure Laterality Date  . c section 2011  2011  . COLONOSCOPY WITH PROPOFOL N/A 12/27/2015   Procedure: COLONOSCOPY WITH PROPOFOL;  Surgeon: Wonda Horner, MD;  Location: [facility] ENDOSCOPY;  Service: Endoscopy;  Laterality: N/A;  . COLOSTOMY N/A 06/11/2015   Procedure:  CREATION OF COLOSTOMY;  Surgeon: Georganna Skeans, MD;  Location: [place];  Service: General;  Laterality:  N/A;  . ESOPHAGOGASTRODUODENOSCOPY N/A 07/19/2015   Procedure: ESOPHAGOGASTRODUODENOSCOPY (EGD);  Surgeon: Clarene Essex, MD;  Location: [facility] ENDOSCOPY;  Service: Endoscopy;  Laterality: N/A;  . FLEXIBLE SIGMOIDOSCOPY N/A 06/05/2015   Procedure: FLEXIBLE SIGMOIDOSCOPY;  Surgeon: Ronald Lobo, MD;  Location: [facility] ENDOSCOPY;  Service: Endoscopy;  Laterality: N/A;  . LAPAROTOMY N/A 06/09/2015   Procedure: EXPLORATORY LAPAROTOMY,  DRAINAGE OF INTRAABDOMINAL ABSCESS, PARTIAL COLON RESECTION,  APPLICATION OF WOUND VAC;  Surgeon: Georganna Skeans, MD;  Location: [place];  Service: General;  Laterality: N/A;  . LAPAROTOMY N/A 06/11/2015   Procedure: RE-EXPLORATION OF ABDOMEN;  Surgeon: Georganna Skeans, MD;  Location: [place];  Service: General;  Laterality: N/A;    Social History:  reports that she has been smoking cigarettes. She started smoking about 4 years ago. She has a 2.00 pack-year smoking history. She has never used smokeless tobacco. She reports that she does not drink alcohol or use drugs.  Allergies:  Allergies  Allergen Reactions  . Other Shortness Of Breath and Swelling    Tree nuts  . Penicillins Other (See Comments)    Unknown childhood allergy Has patient had a PCN reaction causing immediate rash, facial/tongue/throat swelling, SOB or lightheadedness with hypotension: Unknown Has patient had a PCN reaction causing severe rash involving mucus membranes or skin necrosis: Unknown Has patient had a PCN reaction that required hospitalization: Unknown Has patient had a PCN reaction occurring within the last 10 years: Unknown If all of the above answers are "NO", then may proceed with Cephalosporin use.     Medications Prior to Admission  Medication Sig Dispense Refill  . acetaminophen (TYLENOL) 500 MG tablet Take 500 mg by mouth every 6 (six) hours as needed for mild pain, moderate pain or headache.    . ferrous gluconate (FERGON) 324 MG tablet Take 1 tablet (324 mg total) by mouth 2 (two) times daily with a meal.  3  . methocarbamol (ROBAXIN) 500 MG tablet Take 1 tablet (500 mg total) by mouth every 6 (six) hours as needed for muscle spasms. 20 tablet 0  . oxyCODONE (ROXICODONE) 15 MG immediate release tablet Take 1 tablet (15 mg total) by mouth every 4 (four) hours as needed for moderate pain. 20 tablet 0  . [EXPIRED] prednisoLONE acetate (PRED FORTE) 1 % ophthalmic suspension Place 1 drop into the left eye every  6 (six) hours for 10 days. 15 mL 0  . tiZANidine (ZANAFLEX) 4 MG tablet Take 4 mg by mouth every 8 (eight) hours as needed for muscle spasms.    Marland Kitchen albuterol (PROVENTIL) (2.5 MG/3ML) 0.083% nebulizer solution Take 3 mLs (2.5 mg total) by nebulization 2 (two) times daily as needed for wheezing. (Patient not taking: Reported on 09/20/2018) 75 mL 12  . Amino Acids-Protein Hydrolys (FEEDING SUPPLEMENT, PRO-STAT SUGAR FREE 64,) LIQD Take 30 mLs by mouth daily. (Patient not taking: Reported on 09/09/2018) 900 mL 0  . bisacodyl (DULCOLAX) 5 MG EC tablet Take 1 tablet (5 mg total) by mouth daily as needed for moderate constipation. (Patient not taking: Reported on 09/08/2018) 30 tablet 0  . famotidine (PEPCID) 20 MG tablet Take 1 tablet (20 mg total) by mouth 2 (two) times daily. 60 tablet 0  . feeding supplement, ENSURE ENLIVE, (ENSURE ENLIVE) LIQD Take 237 mLs by mouth daily. (Patient not taking: Reported on 09/22/2018) 237 mL 12  . metoprolol tartrate (LOPRESSOR) 25 MG tablet Take 0.5 tablets (12.5 mg total) by mouth 2 (two) times daily. 60 tablet 0  . midodrine (PROAMATINE)  5 MG tablet Take 1 tablet (5 mg total) by mouth 3 (three) times daily with meals. 90 tablet 0  . Multiple Vitamin (MULTIVITAMIN WITH MINERALS) TABS tablet Take 1 tablet by mouth daily. (Patient not taking: Reported on 08/28/2018)    . nutrition supplement, JUVEN, (JUVEN) PACK Take 1 packet by mouth 2 (two) times daily between meals. (Patient not taking: Reported on 09/12/2018)  0  . pantoprazole (PROTONIX) 40 MG tablet Take 1 tablet (40 mg total) by mouth 2 (two) times daily before a meal. 60 tablet 0  . polyethylene glycol (MIRALAX / GLYCOLAX) packet Take 17 g by mouth daily. (Patient not taking: Reported on 09/18/2018) 30 each 0  . senna-docusate (SENOKOT-S) 8.6-50 MG tablet Take 1 tablet by mouth 2 (two) times daily. (Patient not taking: Reported on 09/05/2018)      Prior to Admission medications   Medication Sig Start Date End  Date Taking? Authorizing Provider  acetaminophen (TYLENOL) 500 MG tablet Take 500 mg by mouth every 6 (six) hours as needed for mild pain, moderate pain or headache.   Yes [provider]  ferrous gluconate (FERGON) 324 MG tablet Take 1 tablet (324 mg total) by mouth 2 (two) times daily with a meal. 07/31/18  Yes Charlynne Cousins, MD  methocarbamol (ROBAXIN) 500 MG tablet Take 1 tablet (500 mg total) by mouth every 6 (six) hours as needed for muscle spasms. 09/07/18  Yes Eugenie Filler, MD  oxyCODONE (ROXICODONE) 15 MG immediate release tablet Take 1 tablet (15 mg total) by mouth every 4 (four) hours as needed for moderate pain. 09/07/18  Yes Eugenie Filler, MD  tiZANidine (ZANAFLEX) 4 MG tablet Take 4 mg by mouth every 8 (eight) hours as needed for muscle spasms.   Yes [provider]  albuterol (PROVENTIL) (2.5 MG/3ML) 0.083% nebulizer solution Take 3 mLs (2.5 mg total) by nebulization 2 (two) times daily as needed for wheezing. Patient not taking: Reported on 08/26/2018 06/03/18   Kayleen Memos, DO  Amino Acids-Protein Hydrolys (FEEDING SUPPLEMENT, PRO-STAT SUGAR FREE 64,) LIQD Take 30 mLs by mouth daily. Patient not taking: Reported on 08/26/2018 07/09/18   Debbe Odea, MD  bisacodyl (DULCOLAX) 5 MG EC tablet Take 1 tablet (5 mg total) by mouth daily as needed for moderate constipation. Patient not taking: Reported on 09/13/2018 07/09/18   Debbe Odea, MD  famotidine (PEPCID) 20 MG tablet Take 1 tablet (20 mg total) by mouth 2 (two) times daily. 09/07/18   Eugenie Filler, MD  feeding supplement, ENSURE ENLIVE, (ENSURE ENLIVE) LIQD Take 237 mLs by mouth daily. Patient not taking: Reported on 09/20/2018 07/10/18   Debbe Odea, MD  metoprolol tartrate (LOPRESSOR) 25 MG tablet Take 0.5 tablets (12.5 mg total) by mouth 2 (two) times daily. 09/07/18   Eugenie Filler, MD  midodrine (PROAMATINE) 5 MG tablet Take 1 tablet (5 mg total) by mouth 3 (three) times  daily with meals. 09/07/18   Eugenie Filler, MD  Multiple Vitamin (MULTIVITAMIN WITH MINERALS) TABS tablet Take 1 tablet by mouth daily. Patient not taking: Reported on 09/22/2018 07/10/18   Debbe Odea, MD  nutrition supplement, JUVEN, (JUVEN) PACK Take 1 packet by mouth 2 (two) times daily between meals. Patient not taking: Reported on 09/14/2018 07/09/18   Debbe Odea, MD  pantoprazole (PROTONIX) 40 MG tablet Take 1 tablet (40 mg total) by mouth 2 (two) times daily before a meal. 09/07/18   Eugenie Filler, MD  polyethylene glycol [facility] / Floria Raveling)  packet Take 17 g by mouth daily. Patient not taking: Reported on 09/02/2018 09/07/18   Eugenie Filler, MD  senna-docusate (SENOKOT-S) 8.6-50 MG tablet Take 1 tablet by mouth 2 (two) times daily. Patient not taking: Reported on 08/29/2018 09/07/18   Eugenie Filler, MD    Blood pressure (!) 88/46, pulse 91, temperature 97.7 F (36.5 C), temperature source Oral, resp. rate 20, height 5' 2"  (1.575 m), weight (!) 174.2 kg, SpO2 98 %. Physical Exam: General: pleasant, obese AA female who is laying in bed in NAD HEENT: head is normocephalic, atraumatic.  R sclera erythematous. No scleral icterus.  Pupils equal and round.  Ears and nose without any masses or lesions.  Mouth is dry. Dentition fair Heart: regular, rate, and rhythm.  No obvious murmurs, gallops, or rubs noted.  Palpable pedal pulses bilaterally Lungs: CTAB, no wheezes, rhonchi, or rales noted.  Respiratory effort nonlabored MS: calves soft and nontender Skin: warm and dry Psych: A&Ox3 with an appropriate affect. Neuro: cranial nerves grossly intact, extremity CSM intact bilaterally, normal speech Abd: pink midline incision with open transverse wounds to the left and right of midline between skin fold, soft, few BS heard, no masses, hernias, or organomegaly. Ileostomy RUQ pink with stool in pouch. TTP RUQ around ileostomy, no guarding or rebound/no peritonitis      Results for orders placed or performed during the hospital encounter of 08/31/2018 (from the past 48 hour(s))  Basic metabolic panel     Status: Abnormal   Collection Time: 09/23/18  8:52 PM  Result Value Ref Range   Sodium 131 (L) 135 - 145 mmol/L   Potassium 4.3 3.5 - 5.1 mmol/L    Comment: DELTA CHECK NOTED   Chloride 108 98 - 111 mmol/L   CO2 16 (L) 22 - 32 mmol/L   Glucose, Bld 133 (H) 70 - 99 mg/dL   BUN 16 6 - 20 mg/dL   Creatinine, Ser 2.34 (H) 0.44 - 1.00 mg/dL    Comment: DELTA CHECK NOTED   Calcium 7.4 (L) 8.9 - 10.3 mg/dL   GFR calc non Af Amer 26 (L) >60 mL/min   GFR calc Af Amer 30 (L) >60 mL/min   Anion gap 7 5 - 15    Comment: Performed at [facility], [place] [address] [place], [place] [address]  Magnesium     Status: None   Collection Time: 09/23/18  8:52 PM  Result Value Ref Range   Magnesium 1.7 1.7 - 2.4 mg/dL    Comment: Performed at [facility], [place] [address], [place],  [address]  CBC     Status: Abnormal   Collection Time: 09/24/18 12:17 AM  Result Value Ref Range   WBC 38.4 (H) 4.0 - 10.5 K/uL   RBC 4.09 3.87 - 5.11 MIL/uL   Hemoglobin 10.9 (L) 12.0 - 15.0 g/dL   HCT 33.6 (L) 36.0 - 46.0 %   MCV 82.2 80.0 - 100.0 fL   MCH 26.7 26.0 - 34.0 pg   MCHC 32.4 30.0 - 36.0 g/dL   RDW 16.3 (H) 11.5 - 15.5 %   Platelets 186 150 - 400 K/uL   nRBC 0.0 0.0 - 0.2 %    Comment: Performed at [facility], [place] [address] [place], [place] [address]  Lactic acid, plasma     Status: Abnormal   Collection Time: 09/24/18 12:17 AM  Result Value Ref Range   Lactic Acid, Venous 5.1 (HH) 0.5 - 1.9  mmol/L    Comment: CRITICAL RESULT CALLED TO, READ BACK BY AND VERIFIED WITH: BISSNU RN AT 3300 09/24/18 BY TIBBITTS,K Performed at [facility], [place] [address] [place], [place] [address]   Differential     Status: Abnormal   Collection Time: 09/24/18 12:17 AM  Result Value Ref Range    Neutrophils Relative % 88 %    Comment: VACUOLATED NEUTROPHILS   Neutro Abs 33.6 (H) 1.7 - 7.7 K/uL   Lymphocytes Relative 5 %   Lymphs Abs 1.7 0.7 - 4.0 K/uL   Monocytes Relative 3 %   Monocytes Absolute 1.3 (H) 0.1 - 1.0 K/uL   Eosinophils Relative 1 %   Eosinophils Absolute 0.3 0.0 - 0.5 K/uL   Basophils Relative 0 %   Basophils Absolute 0.1 0.0 - 0.1 K/uL    Comment: Performed at [facility], [place] [address], [place], [place] [address]  Urinalysis, Routine w reflex microscopic     Status: Abnormal   Collection Time: 09/24/18  1:29 AM  Result Value Ref Range   Color, Urine AMBER (A) YELLOW    Comment: BIOCHEMICALS MAY BE AFFECTED BY COLOR   APPearance CLOUDY (A) CLEAR   Specific Gravity, Urine 1.017 1.005 - 1.030   pH 5.0 5.0 - 8.0   Glucose, UA NEGATIVE NEGATIVE mg/dL   Hgb urine dipstick MODERATE (A) NEGATIVE   Bilirubin Urine NEGATIVE NEGATIVE   Ketones, ur NEGATIVE NEGATIVE mg/dL   Protein, ur 30 (A) NEGATIVE mg/dL   Nitrite NEGATIVE NEGATIVE   Leukocytes, UA SMALL (A) NEGATIVE   RBC / HPF 0-5 0 - 5 RBC/hpf   WBC, UA 11-20 0 - 5 WBC/hpf   Bacteria, UA RARE (A) NONE SEEN   Squamous Epithelial / LPF 0-5 0 - 5   WBC Clumps PRESENT    Mucus PRESENT    Hyaline Casts, UA PRESENT    Amorphous Crystal PRESENT    Crystals PRESENT (A) NEGATIVE    Comment: Performed at [facility], [place] [address], [place], [place] [address]  Aerobic/Anaerobic Culture (surgical/deep wound)     Status: None (Preliminary result)   Collection Time: 09/24/18  1:29 AM  Result Value Ref Range   Specimen Description      WOUND ABDOMEN Performed at [place] [address] [place], [place] [address]    Special Requests      NONE Performed at [facility], [place] [address], [place], [place] [address]    Gram Stain      ABUNDANT WBC PRESENT, PREDOMINANTLY PMN RARE GRAM POSITIVE COCCI Performed at [place], [place] [address] [place], [place] [address]    Culture PENDING    Report Status PENDING   Lipase, blood     Status: None   Collection Time: 09/24/18  3:01 AM  Result Value Ref Range   Lipase 19 11 - 51 U/L    Comment: Performed at [facility], [place] [address], [place], [place] [address]  Comprehensive metabolic panel     Status: Abnormal   Collection Time: 09/24/18  3:01 AM  Result Value Ref Range   Sodium 132 (L) 135 - 145 mmol/L   Potassium 3.7 3.5 - 5.1 mmol/L   Chloride 111 98 - 111 mmol/L   CO2 14 (L) 22 - 32 mmol/L   Glucose, Bld 109 (H) 70 - 99 mg/dL   BUN 16 6 - 20 mg/dL   Creatinine, Ser 2.30 (  H) 0.44 - 1.00 mg/dL   Calcium 6.7 (L) 8.9 - 10.3 mg/dL   Total Protein 3.8 (L) 6.5 - 8.1 g/dL   Albumin 1.2 (L) 3.5 - 5.0 g/dL   AST 19 15 - 41 U/L   ALT 13 0 - 44 U/L   Alkaline Phosphatase 92 38 - 126 U/L   Total Bilirubin 0.9 0.3 - 1.2 mg/dL   GFR calc non Af Amer 26 (L) >60 mL/min   GFR calc Af Amer 30 (L) >60 mL/min   Anion gap 7 5 - 15    Comment: Performed at [facility], [place] [address], [place], [place] [address]  Lactic acid, plasma     Status: Abnormal   Collection Time: 09/24/18  4:55 AM  Result Value Ref Range   Lactic Acid, Venous 3.1 (HH) 0.5 - 1.9 mmol/L    Comment: CRITICAL RESULT CALLED TO, READ BACK BY AND VERIFIED WITH: TOSTO,R RN AT 5732 09/24/18 BY TIBBITTS,K Performed at [facility], [place] [address], [place], [place] [address]   CBC with Differential/Platelet     Status: Abnormal   Collection Time: 09/24/18  6:49 AM  Result Value Ref Range   WBC 35.9 (H) 4.0 - 10.5 K/uL   RBC 3.54 (L) 3.87 - 5.11 MIL/uL   Hemoglobin 9.7 (L) 12.0 - 15.0 g/dL   HCT 29.4 (L) 36.0 - 46.0 %   MCV 83.1 80.0 - 100.0 fL   MCH 27.4 26.0 - 34.0 pg   MCHC 33.0 30.0 - 36.0 g/dL   RDW 16.7 (H) 11.5 - 15.5 %   Platelets 200 150 - 400 K/uL   nRBC 0.0 0.0 - 0.2 %   Neutrophils Relative % 92 %   Neutro Abs 32.7 (H) 1.7 -  7.7 K/uL   Lymphocytes Relative 3 %   Lymphs Abs 1.1 0.7 - 4.0 K/uL   Monocytes Relative 3 %   Monocytes Absolute 1.2 (H) 0.1 - 1.0 K/uL   Eosinophils Relative 0 %   Eosinophils Absolute 0.1 0.0 - 0.5 K/uL   Basophils Relative 0 %   Basophils Absolute 0.1 0.0 - 0.1 K/uL   WBC Morphology DOHLE BODIES     Comment: MILD LEFT SHIFT (1-5% METAS, OCC MYELO, OCC BANDS) TOXIC GRANULATION VACUOLATED NEUTROPHILS    Immature Granulocytes 2 %   Abs Immature Granulocytes 0.78 (H) 0.00 - 0.07 K/uL    Comment: Performed at [facility], [place] [address] [place], [place] [address]   Ct Abdomen Pelvis Wo Contrast  Result Date: 09/24/2018 CLINICAL DATA:  Acute onset of generalized weakness and abdominal pain. Question of recent pancreatitis. Leukocytosis. Acute renal failure. EXAM: CT ABDOMEN AND PELVIS WITHOUT CONTRAST TECHNIQUE: Multidetector CT imaging of the abdomen and pelvis was performed following the standard protocol without IV contrast. COMPARISON:  CT of the abdomen and pelvis from 09/04/2018 FINDINGS: Lower chest: Trace right-sided pleural fluid is noted, with associated atelectasis. The visualized portions of the mediastinum are unremarkable. Hepatobiliary: The liver is grossly unremarkable in appearance. The gallbladder is not well characterized due to surrounding ascites. The common bile duct is normal in caliber. New ascites about the liver is higher in attenuation than the liver itself, raising suspicion for hemorrhage or complex fluid. Pancreas: The previously noted cystic focus at the pancreatic tail is less well seen on the current study. On correlation with lab results, the patient's lipase is within normal limits, without definite evidence for pancreatitis. Spleen: The spleen is unremarkable in  appearance. Adrenals/Urinary Tract: The adrenal glands are unremarkable in appearance. The kidneys are within normal limits. There is no evidence of hydronephrosis. No renal or  ureteral stones are seen. No perinephric stranding is appreciated. Stomach/Bowel: The patient is status post resection of much of the colon. Relatively high attenuation fluid is noted tracking about the bowel. Visualized small bowel loops are largely decompressed and unremarkable in appearance. A right lower quadrant ileostomy is grossly unremarkable in appearance, with herniation of the distal small bowel into the hernia. There is no evidence of bowel obstruction. The patient's Hartmann's pouch is grossly unremarkable in appearance. Vascular/Lymphatic: The abdominal aorta is unremarkable in appearance. The inferior vena cava is grossly unremarkable. No retroperitoneal lymphadenopathy is seen. No pelvic sidewall lymphadenopathy is identified. Reproductive: The bladder is decompressed, with a Foley catheter in place. The uterus is grossly unremarkable in appearance. No suspicious adnexal masses are seen. Other: No additional soft tissue abnormalities are seen. Musculoskeletal: No acute osseous abnormalities are identified. The visualized musculature is unremarkable in appearance. IMPRESSION: 1. New ascites about the liver is higher in attenuation than the liver itself, raising suspicion for hemorrhage or complex fluid. The patient's hematocrit has not significantly changed recently; given the patient's symptoms and severe leukocytosis, would correlate clinically for evidence of peritonitis. 2. Previously noted cystic focus at the pancreatic tail is less well seen on the current study. On correlation with lab results, the patient's lipase is within normal limits, without definite evidence for pancreatitis. 3. Right lower quadrant ileostomy is grossly unremarkable in appearance, with herniation of the distal small bowel into the hernia. No evidence of bowel obstruction. 4. Trace right-sided pleural fluid, with associated atelectasis. These results were called by telephone at the time of interpretation on 09/24/2018 at  4:39 am to [facility] at [facility] ICU, who verbally acknowledged these results. Electronically Signed   By: Garald Balding M.D.   On: 09/24/2018 04:43   Dg Chest Port 1 View  Result Date: 09/24/2018 CLINICAL DATA:  Dyspnea and leukocytosis EXAM: PORTABLE CHEST 1 VIEW COMPARISON:  08/27/2018 chest radiograph. FINDINGS: Stable leftward displacement of the trachea at the thoracic inlet. Stable cardiomediastinal silhouette with mild cardiomegaly. No pneumothorax. No pleural effusion. Lungs appear clear, with no acute consolidative airspace disease and no pulmonary edema. IMPRESSION: Stable mild cardiomegaly without pulmonary edema. No active pulmonary disease. Stable leftward displacement of the trachea at the thoracic inlet suggesting enlarged right thyroid lobe. Electronically Signed   By: Ilona Sorrel M.D.   On: 09/24/2018 02:01   Anti-infectives (From admission, onward)   Start     Dose/Rate Route Frequency Ordered Stop   09/26/18 0600  vancomycin (VANCOCIN) 1,500 mg in sodium chloride 0.9 % 500 mL IVPB     1,500 mg 250 mL/hr over 120 Minutes Intravenous Every 48 hours 09/24/18 0649     09/24/18 0530  vancomycin (VANCOCIN) 2,500 mg in sodium chloride 0.9 % 500 mL IVPB     2,500 mg 250 mL/hr over 120 Minutes Intravenous  Once 09/24/18 0529     09/24/18 0300  metroNIDAZOLE (FLAGYL) IVPB 500 mg     500 mg 100 mL/hr over 60 Minutes Intravenous Every 8 hours 09/24/18 0246     09/24/18 0300  ceFEPIme (MAXIPIME) 2 g in sodium chloride 0.9 % 100 mL IVPB     2 g 200 mL/hr over 30 Minutes Intravenous Every 12 hours 09/24/18 0248          Assessment/Plan Morbid obesity, Chronic hypotension CKD-II H/o  Crohns colitis s/p exploratory laparotomy, drainage intra-abdominal abscess, partial colectomy, colostomy 05/2015 by Dr. Grandville Silos for a perforated descending colon, and s/p total colectomy/ileostomy 2018 at [facility]  Lactic acidosis Leukocytosis AKI Suspected hypovolemic shock Abdominal pain New  ascites/perihepatic fluid collection - CT scan abdomen/pelvis shows new ascites about the liver is higher in attenuation than the liver itself, raising suspicion for hemorrhage or complex fluid - Patient states that her abdominal pain is nearly resolved, although she is still somewhat tender in the RUQ. No peritonitis on exam. Hemoglobin without significant drop to suggest intraabdominal bleeding. Continue resuscitation per CCM. Will review CT scan with MD. Keep NPO.  ID - maxipime/flagyl/vancomycin 1/2>> VTE - SCDs FEN - IVF, NPO Foley - in place  [place], [place] [facility] Surgery 09/24/2018, 8:58 AM Pager: [phone] Mon 7:00 am -11:30 AM Tues-Fri 7:00 am-4:30 pm Sat-Sun 7:00 am-11:30 am

## 2018-09-24 NOTE — Progress Notes (Signed)
OT Cancellation Note  Patient Details Name: Dominique [name] MRN: [id number] DOB: 07-Jun-1981   Cancelled Treatment:    Reason Eval/Treat Not Completed: Medical issues which prohibited therapy, hypotension  [name] 09/24/2018, 10:57 AM  [name], OTR/L Acute Rehabilitation Services [phone] [place] pager [phone] office 09/24/2018

## 2018-09-24 NOTE — Progress Notes (Signed)
PCCM Interval Note  Patient with continued hypotension and low urinary output. CVP 10. Has received 3.5 L NS and albumin x 2 without improvement. Will start Levophed for MAP goal >65.   Hayden Pedro, AGACNP-BC [facility]  PCCM Pgr: [phone]

## 2018-09-24 NOTE — Progress Notes (Signed)
PCCM Interval Note  38 year old female transferred to ICU at 0630 on 1/2 for progressive hypotension, CT A with peritonitis vs hemorrhage. Received 3.5 L NS and 25% albumin x 1 with some improvement. This AM remains off vasopressors with systolic 643.     Plan -Continue Cefepime/Flagyl/Vancomycin  -Trend LA, Obtain ABG -Follow Culture Data  -Trend CBC q6h, no evidence of bleeding, no bruising noted around Abdomen  -Increase midodrine to 10 mg BID   CC Time: 32 minutes  Hayden Pedro, AGACNP-BC [facility]   PCCM Pgr: [phone]

## 2018-09-24 NOTE — Progress Notes (Signed)
PT Cancellation Note  Patient Details Name: Dominique [name] MRN: [id number] DOB: 22-Nov-1980   Cancelled Treatment:     PT deferred this date 2* hypotension - RN aware.  Will follow.   [name] 09/24/2018, 8:36 AM

## 2018-09-24 NOTE — Progress Notes (Signed)
PROGRESS NOTE    Dominique [name]  PNT:[id number] DOB: 07/16/1981 DOA: 09/14/2018 PCP: Benito Mccreedy, MD  Brief Narrative: 38 y.o.femalewithhistory of Crohn's disease status post colectomy with diverting ostomy was recently admitted for extended stay after patient was found to have hypotension abdominal wounds and possiblePOTSwas placed on midodrine and also with abdominal pain patient was placed on PPI and Pepcid had keratoplasty of the left cornea at [facility] and was transferred back to the hospital and discharge 10 days ago presents back to the ER because of increasing weakness over the last 24 hours feeling dizzy on standing and also having increasing abdominal pain mostly in the epigastric and left upper quadrant. Denies nausea vomiting or diarrhea. Has not lost consciousness or did not have any chest pain or shortness of breath.  ED Course:In the ER patient was found to be mildly hypotensive with elevated lactate and leukocytosis. CAT scan of the abdomen shows possibility of pancreatitis. Labs show leukocytosis. Creatinine has worsened from 0.7-1.1. Patient was given fluid bolus and pain relief medication admitted for further management of hypotension abdominal pain likely from pancreatitis. Generalized weakness could be from hypotension and dehydration  09/24/2018 patient was transferred to the unit overnight with hypotension and increasing leukocytosis with complaints of abdominal pain.  A CT of the abdomen and pelvis done without contrast shows findings concerning for possible hemorrhage versus peritonitis.  She was started on broad-spectrum antibiotics Vanco cefepime and Flagyl.  Patient has not had any further drop in her hemoglobin to suggest bleeding.  She was treated with IV fluids and albumin for hypotension.  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  She was also found to have an elevated creatinine compared to admission. Assessment & Plan:     Principal Problem:   Acute pancreatitis Active Problems:   Morbid obesity with body mass index of 70 and over in adult [facility])   Orthostatic hypotension   Crohn's colitis with perforation s/p left colectomy/colostomy 2016   Acute peritonitis (HCC)   OSA on CPAP   Chronic anemia   Colostomy care ([place])   ARF (acute renal failure) (HCC)   Intractable vomiting   Dizzy   Other chronic pain   FTT (failure to thrive) in adult   Intertriginous dermatitis associated with moisture   SIRS (systemic inflammatory response syndrome) (HCC)   Septic shock (HCC)   Hypovolemic shock (HCC)   Lactic acidosis   #1 abdominal pain patient initially admitted with a possible diagnosis of pancreatitis though her lipase and triglycerides were normal CT findings showed some evidence of inflammation of the tail of the pancreas..  CT showed possible peritonitis versus fluid around the liver concerning for questionable hemorrhage.  Patient started on broad-spectrum antibiotics.  Surgery and PCCM consulted and appreciate their input.    Stress dose steroids given.  #2 AKI on CKD stage II secondary to sepsis/hypotension continue fluids and monitor renal functions.  #3 history of Crohn's disease status post ileostomy  #4 multiple chronic wounds and skin damage along the intertriginous areas continue wound care recommendation.  #5 left corneal perforation status post keratoplasty continue prednisone drops    Nutrition Problem: Increased nutrient needs Etiology: wound healing     Signs/Symptoms: estimated needs    Interventions: MVI, Refer to RD note for recommendations  Estimated body mass index is 70.23 kg/m as calculated from the following:   Height as of this encounter: 5' 2"  (1.575 m).   Weight as of this encounter: 174.2  kg.  DVT prophylaxis: Lovenox Code Status: Full code Family Communication: None available Disposition Plan:.  Unknown   Consultants: PCCM   Procedures: Right IJ  09/24/2018 Antimicrobials: Vanco cefepime and Flagyl  Subjective: Patient is resting in bed she denies any chest pain or shortness of breath complains of nausea and vague abdominal discomfort  Objective: Vitals:   09/24/18 0900 09/24/18 1000 09/24/18 1200 09/24/18 1440  BP: (!) 87/36 (!) 79/33    Pulse:  84  83  Resp: 16 14  14   Temp:   97.9 F (36.6 C)   TempSrc:   Oral   SpO2:  100%  97%  Weight:      Height:        Intake/Output Summary (Last 24 hours) at 09/24/2018 1541 Last data filed at 09/24/2018 1435 Gross per 24 hour  Intake 1290.1 ml  Output 690 ml  Net 600.1 ml   Filed Weights   09/22/18 0527 09/23/18 0507 09/24/18 0451  Weight: (!) 165.1 kg (!) 165.1 kg (!) 174.2 kg    Examination:  General exam: Appears calm and comfortable  Respiratory system: Clear to auscultation. Respiratory effort normal. Cardiovascular system: S1 & S2 heard, RRR. No JVD, murmurs, rubs, gallops or clicks. No pedal edema. Gastrointestinal system: Abdomen is nondistended, soft and tender along the right side of the abdomen. No organomegaly or masses felt. Normal bowel sounds heard.  Ileostomy in place Central nervous system: Alert and oriented. No focal neurological deficits. Extremities: Symmetric 5 x 5 power. Skin: No rashes, lesions or ulcers Psychiatry: Judgement and insight appear normal. Mood & affect appropriate.     Data Reviewed: I have personally reviewed following labs and imaging studies  CBC: Recent Labs  Lab 09/20/18 0837 09/21/18 0828 09/21/18 2110 09/24/18 0017 09/24/18 0649 09/24/18 1215  WBC 11.4* 13.1* 15.8* 38.4* 35.9* 41.0*  NEUTROABS 8.6* 10.0*  --  33.6* 32.7* 38.0*  HGB 11.4* 11.1* 11.0* 10.9* 9.7* 10.4*  HCT 33.3* 31.6* 33.2* 33.6* 29.4* 30.8*  MCV 79.1* 78.4* 80.2 82.2 83.1 81.9  PLT 266 265 292 186 200 350   Basic Metabolic Panel: Recent Labs  Lab 09/20/18 0837 09/21/18 0730 09/21/18 2110 09/23/18 2052 09/24/18 0301 09/24/18 0934 09/24/18 1215   NA 138 138 136 131* 132*  --  133*  K 3.5 4.3 3.3* 4.3 3.7  --  4.3  CL 113* 112* 110 108 111  --  111  CO2 17* 18* 19* 16* 14*  --  16*  GLUCOSE 84 86 117* 133* 109*  --  113*  BUN 8 8 9 16 16   --  19  CREATININE 1.13* 1.13* 1.20* 2.34* 2.30*  --  2.33*  CALCIUM 7.7* 7.8* 7.7* 7.4* 6.7*  --  7.0*  MG 1.5* 1.8 1.8 1.7  --  1.5*  --    GFR: Estimated Creatinine Clearance: 51.5 mL/min (A) (by C-G formula based on SCr of 2.33 mg/dL (H)). Liver Function Tests: Recent Labs  Lab 09/20/18 0837 09/24/18 0301  AST 25 19  ALT 15 13  ALKPHOS 99 92  BILITOT 0.9 0.9  PROT 4.8* 3.8*  ALBUMIN 1.6* 1.2*   Recent Labs  Lab 09/20/18 0837 09/24/18 0301  LIPASE 33 19   No results for input(s): AMMONIA in the last 168 hours. Coagulation Profile: No results for input(s): INR, PROTIME in the last 168 hours. Cardiac Enzymes: No results for input(s): CKTOTAL, CKMB, CKMBINDEX, TROPONINI in the last 168 hours. BNP (last 3 results) No results for input(s): PROBNP  in the last 8760 hours. HbA1C: No results for input(s): HGBA1C in the last 72 hours. CBG: No results for input(s): GLUCAP in the last 168 hours. Lipid Profile: Recent Labs    09/24/18 0934  TRIG 59   Thyroid Function Tests: No results for input(s): TSH, T4TOTAL, FREET4, T3FREE, THYROIDAB in the last 72 hours. Anemia Panel: No results for input(s): VITAMINB12, FOLATE, FERRITIN, TIBC, IRON, RETICCTPCT in the last 72 hours. Sepsis Labs: Recent Labs  Lab 09/24/18 0017 09/24/18 0455 09/24/18 0934  LATICACIDVEN 5.1* 3.1* 2.2*    Recent Results (from the past 240 hour(s))  Aerobic/Anaerobic Culture (surgical/deep wound)     Status: None (Preliminary result)   Collection Time: 09/24/18  1:29 AM  Result Value Ref Range Status   Specimen Description   Final    WOUND ABDOMEN Performed at [facility] [address], [place], [place] [address]    Special Requests   Final    NONE Performed at [facility], [place] [address] [place], [place] [address]    Gram Stain   Final    ABUNDANT WBC PRESENT, PREDOMINANTLY PMN RARE GRAM POSITIVE COCCI Performed at [facility], [place] [address], [place], [place] [address]    Culture PENDING  Incomplete   Report Status PENDING  Incomplete         Radiology Studies: Ct Abdomen Pelvis Wo Contrast  Result Date: 09/24/2018 CLINICAL DATA:  Acute onset of generalized weakness and abdominal pain. Question of recent pancreatitis. Leukocytosis. Acute renal failure. EXAM: CT ABDOMEN AND PELVIS WITHOUT CONTRAST TECHNIQUE: Multidetector CT imaging of the abdomen and pelvis was performed following the standard protocol without IV contrast. COMPARISON:  CT of the abdomen and pelvis from 08/29/2018 FINDINGS: Lower chest: Trace right-sided pleural fluid is noted, with associated atelectasis. The visualized portions of the mediastinum are unremarkable. Hepatobiliary: The liver is grossly unremarkable in appearance. The gallbladder is not well characterized due to surrounding ascites. The common bile duct is normal in caliber. New ascites about the liver is higher in attenuation than the liver itself, raising suspicion for hemorrhage or complex fluid. Pancreas: The previously noted cystic focus at the pancreatic tail is less well seen on the current study. On correlation with lab results, the patient's lipase is within normal limits, without definite evidence for pancreatitis. Spleen: The spleen is unremarkable in appearance. Adrenals/Urinary Tract: The adrenal glands are unremarkable in appearance. The kidneys are within normal limits. There is no evidence of hydronephrosis. No renal or ureteral stones are seen. No perinephric stranding is appreciated. Stomach/Bowel: The patient is status post resection of much of the colon. Relatively high attenuation fluid is noted tracking about the bowel. Visualized small bowel loops are largely decompressed and  unremarkable in appearance. A right lower quadrant ileostomy is grossly unremarkable in appearance, with herniation of the distal small bowel into the hernia. There is no evidence of bowel obstruction. The patient's Hartmann's pouch is grossly unremarkable in appearance. Vascular/Lymphatic: The abdominal aorta is unremarkable in appearance. The inferior vena cava is grossly unremarkable. No retroperitoneal lymphadenopathy is seen. No pelvic sidewall lymphadenopathy is identified. Reproductive: The bladder is decompressed, with a Foley catheter in place. The uterus is grossly unremarkable in appearance. No suspicious adnexal masses are seen. Other: No additional soft tissue abnormalities are seen. Musculoskeletal: No acute osseous abnormalities are identified. The visualized musculature is unremarkable in appearance. IMPRESSION: 1. New ascites about the liver is higher in attenuation than the liver itself, raising suspicion  for hemorrhage or complex fluid. The patient's hematocrit has not significantly changed recently; given the patient's symptoms and severe leukocytosis, would correlate clinically for evidence of peritonitis. 2. Previously noted cystic focus at the pancreatic tail is less well seen on the current study. On correlation with lab results, the patient's lipase is within normal limits, without definite evidence for pancreatitis. 3. Right lower quadrant ileostomy is grossly unremarkable in appearance, with herniation of the distal small bowel into the hernia. No evidence of bowel obstruction. 4. Trace right-sided pleural fluid, with associated atelectasis. These results were called by telephone at the time of interpretation on 09/24/2018 at 4:39 am to [facility] ICU, who verbally acknowledged these results. Electronically Signed   By: Garald Balding M.D.   On: 09/24/2018 04:43   Dg Chest Port 1 View  Result Date: 09/24/2018 CLINICAL DATA:  Central line placement EXAM: PORTABLE CHEST 1 VIEW  COMPARISON:  Chest radiograph from earlier today. FINDINGS: Right internal jugular central venous catheter terminates at the cavoatrial junction. Stable cardiomediastinal silhouette with mild cardiomegaly. No pneumothorax. No pleural effusion. Low lung volumes. No overt pulmonary edema. No acute consolidative airspace disease. IMPRESSION: 1. Right internal jugular central venous catheter terminates at the cavoatrial junction. No pneumothorax. 2. Low lung volumes. Stable mild cardiomegaly without overt pulmonary edema. Electronically Signed   By: Ilona Sorrel M.D.   On: 09/24/2018 14:09   Dg Chest Port 1 View  Result Date: 09/24/2018 CLINICAL DATA:  Dyspnea and leukocytosis EXAM: PORTABLE CHEST 1 VIEW COMPARISON:  09/12/2018 chest radiograph. FINDINGS: Stable leftward displacement of the trachea at the thoracic inlet. Stable cardiomediastinal silhouette with mild cardiomegaly. No pneumothorax. No pleural effusion. Lungs appear clear, with no acute consolidative airspace disease and no pulmonary edema. IMPRESSION: Stable mild cardiomegaly without pulmonary edema. No active pulmonary disease. Stable leftward displacement of the trachea at the thoracic inlet suggesting enlarged right thyroid lobe. Electronically Signed   By: Ilona Sorrel M.D.   On: 09/24/2018 02:01        Scheduled Meds: . DULoxetine  20 mg Oral Daily  . enoxaparin (LOVENOX) injection  40 mg Subcutaneous Daily  . famotidine  20 mg Oral BID  . ferrous gluconate  324 mg Oral BID WC  . hydrocortisone sod succinate (SOLU-CORTEF) inj  100 mg Intravenous Q8H  . mouth rinse  15 mL Mouth Rinse BID  . midodrine  10 mg Oral TID WC  . multivitamin with minerals  1 tablet Oral Daily  . pantoprazole  40 mg Oral BID AC  . polyethylene glycol  17 g Oral Daily  . prednisoLONE acetate  1 drop Left Eye Q6H  . senna-docusate  1 tablet Oral BID   Continuous Infusions: . sodium chloride 100 mL/hr at 09/24/18 1419  . sodium chloride    . ceFEPime  (MAXIPIME) IV Stopped (09/24/18 1225)  . metronidazole 500 mg (09/24/18 1435)  . phenylephrine (NEO-SYNEPHRINE) Adult infusion    . [START ON 09/26/2018] vancomycin       LOS: 8 days     Georgette Shell, MD [facility]   If 7PM-7AM, please contact night-coverage [email or web address] Password [facility] 09/24/2018, 3:41 PM

## 2018-09-24 NOTE — Procedures (Signed)
Central Venous Catheter Insertion Procedure Note Dominique [name] [id number] 08/20/1981  Procedure: Insertion of Central Venous Catheter Indications: Assessment of intravascular volume, Drug and/or fluid administration and Frequent blood sampling  Procedure Details Verbal Consent Obtained from Patient: Risks of procedure as well as the alternatives and risks of each were explained to the (patient/caregiver).  Consent for procedure obtained. Time Out: Verified patient identification, verified procedure, site/side was marked, verified correct patient position, special equipment/implants available, medications/allergies/relevent history reviewed, required imaging and test results available.  Performed  Maximum sterile technique was used including antiseptics, cap, gloves, gown, hand hygiene, mask and sheet. Skin prep: Chlorhexidine; local anesthetic administered A antimicrobial bonded/coated triple lumen catheter was placed in the right internal jugular vein using the Seldinger technique.  Evaluation Blood flow good Complications: No apparent complications Patient did tolerate procedure well. Chest X-ray ordered to verify placement.  CXR: pending.  Hayden Pedro, AGACNP-BC [facility]  Pgr: [phone]  PCCM Pgr: [phone]

## 2018-09-24 NOTE — Progress Notes (Signed)
Pharmacy Antibiotic Note  Dominique [name] is a 38 y.o. female admitted on 08/28/2018 with SIRS with suspicion of intra abdominal infection. Elevated Lactate and leukocytosis.Marland Kitchen  Pharmacy has been consulted for Cefepime dosing.  Plan: Cefepime 2gm iv q12hr  Height: 5' 2"  (157.5 cm) Weight: (!) 363 lb 15.3 oz (165.1 kg) IBW/kg (Calculated) : 50.1  Temp (24hrs), Avg:97.9 F (36.6 C), Min:97.5 F (36.4 C), Max:98.1 F (36.7 C)  Recent Labs  Lab 09/17/18 0500 09/20/18 0837 09/21/18 0730 09/21/18 0828 09/21/18 2110 09/23/18 2052 09/24/18 0017  WBC 11.0* 11.4*  --  13.1* 15.8*  --  38.4*  CREATININE 0.91 1.13* 1.13*  --  1.20* 2.34*  --   LATICACIDVEN 1.7  --   --   --   --   --  5.1*    Estimated Creatinine Clearance: 49.5 mL/min (A) (by C-G formula based on SCr of 2.34 mg/dL (H)).    Allergies  Allergen Reactions  . Other Shortness Of Breath and Swelling    Tree nuts  . Penicillins Other (See Comments)    Unknown childhood allergy Has patient had a PCN reaction causing immediate rash, facial/tongue/throat swelling, SOB or lightheadedness with hypotension: Unknown Has patient had a PCN reaction causing severe rash involving mucus membranes or skin necrosis: Unknown Has patient had a PCN reaction that required hospitalization: Unknown Has patient had a PCN reaction occurring within the last 10 years: Unknown If all of the above answers are "NO", then may proceed with Cephalosporin use.     Antimicrobials this admission: Cefepime 09/24/2018 >> Flagyl 09/24/2018 >>  Dose adjustments this admission: -  Microbiology results: -  Thank you for allowing pharmacy to be a part of this patient's care.  [name] 09/24/2018 2:49 AM

## 2018-09-24 NOTE — Procedures (Signed)
Arterial Catheter Insertion Procedure Note Sarai January [id number] 08-29-1981  Procedure: Insertion of Arterial Catheter  Indications: Blood pressure monitoring  Procedure Details Consent: Risks of procedure as well as the alternatives and risks of each were explained to the (patient/caregiver).  Consent for procedure obtained. Time Out: Verified patient identification, verified procedure, site/side was marked, verified correct patient position, special equipment/implants available, medications/allergies/relevent history reviewed, required imaging and test results available.  Performed  Maximum sterile technique was used including antiseptics, cap, gloves, gown, hand hygiene, mask and sheet. Skin prep: Chlorhexidine; local anesthetic administered 20 gauge catheter was inserted into left radial artery using the Seldinger technique. ULTRASOUND GUIDANCE USED: YES Evaluation Blood flow good; BP tracing good. Complications: No apparent complications.   Winferd Humphrey 09/24/2018

## 2018-09-24 NOTE — Progress Notes (Signed)
CRITICAL VALUE ALERT  Critical Value:  Lactic Acid 3.1  Date & Time Notied:  09/24/18 7737  Provider Notified: Baltazar Najjar, MD  Orders Received/Actions taken: awaiting orders

## 2018-09-24 NOTE — Progress Notes (Signed)
Continue to monitor B/P by aline. Order received as long as systolic B/P greater than 90 systolic than 90 do not start vasopressor. MD is aware of low urine output and labs.

## 2018-09-24 NOTE — Progress Notes (Addendum)
Shift event: Brief hx: Pt was admitted on 09/11/2018 for weakness. Found to be hypotensive. + abdominal pain with hx of Crohn's, diverting colostomy and open abdominal wounds. CT abd/pelvis showed fluid around the tail of the pancreas and pt was admitted with acute pancreatitis. However, lipase and TGs were normal. GI was consulted who thought pain was due to gastroparesis and chronic narcotic use and signed off of care.   Tonight, RN has paged for low BPs-70 to 80s range. Pt usually runs in the 80s and is on Midodrine. Bolus was given without change in BP and BP actually trended down. Mental status remained normal. Checked bladder scan and only positive for 130cc. Creatinine came back at 2.34, up from 1.2. CBC with diff was ordered along with lactate. NP went to bedside to see patient.  S: pt says her asthma is "acting up" and she feels a tad SOB. She states her upper abdominal pain is worse than it was when she was admitted. Denies other pain, dizziness, n/v or diarrhea.  O: She appears well and in NAD. Morbidly obese. Alert and oriented x 3. Pleasant and cooperative. BP now 67/42, HR 80s-90s, NSR, Afebrile, RR 16, O2 sat 96% on RA. Card: S1S2 with RRR. Lungs: CTA, no wheezing noted. Abd: Round, obese. Ostomy noted with greenish brown stool in bag. Dsg to mid abdomen. BS +. Soft. Tender in the LLQ and especially tender on palpation to the epigastric area. MOE x4. Neuro: no focal abnormalities noted.  A/P:  1. Hypotension without response to 1500cc IVF boluses-NP questions accuracy of BPs due to her body mass. RN can not hear a manual BP. However, when her LA came back at 5.1, SIRS is suspected. IVF bolus 2L ordered with maintenance fluids at 100cc/hr. Upon transfer, will place Aline for more accurate BP readings. If BP remains low after fluids, will consult critical care or sooner if pt deteriorates. Can use Albumin if needed. Foley for accurate UO. She is already on Florinef and Midodrine (on meds at home,  so hypotension is chronic) Thought to have POTS also.  2. SIRS-IVFs. WBCC almost tripled with left shift (not on steroids except eye drops). Will search for sites of infection. May be worsening pancreatitis (pseudocysts?). Recheck lipase and r/p CT abd/pelvis. No contrast this time due to creatinine and test needed stat. Check CXR, UA/culture, abdominal wound cultures. Start empiric abx-Cefepime and Flagyl in face of PCN allergy.   3. AKI-worsening. IVFs/boluses as above then maintenance fluids. Follow creatinine trend. 4. Abdominal wounds-WOC following. Send cultures.  Pt and plan of care discussed with Dr. Myna Hidalgo of [facility] who agrees with plan.  Will follow.  Update: Now, in SDU. Tearful when discussing situation. Attempting Aline. Last peripheral BP in the upper 80s. Mentating normally. Additional labs ordered including TGs at 9am after being NPO. CXR negative for acute process. UA/cx pending. Wound cx pending.  Update: Lipase 19, CMP reviewed with normal LFTs, creat trending down with IVFs. Aline able to be placed and last BP was in the 80s. Ordered Albumin IV. Mental status remains normal.  Spoke with Elink to get a critical care consult. Dr. Emmit Alexanders suggested adding Vanc after review of CT (new ascites around liver, gallbladder not well visualized, evidence of peritonitis, GB not well visualized, no mention of abscess). Also, suggested Cortisol stim test and addition of stress dosed steroids after test done.  CT on admission did not show any suspicion for GB disease. CBD was normal in diameter. LFTs normal. No fluid  was noted around liver on first CT 09/22/2018.  Looking back over chart, she had an intra abdominal abscess when she had her colectomy for crohn's in 2016.  Given all the testing tonight, believe this is likely an intra abdominal infection, maybe even abscess given hx of colon disease. Tonight's CT stated fluid around liver may be hemorrhage, but pt's Hgb/Hct have been stable for days, so  doubt hemorrhage. Also, given no mention of pancreas abnormality and normal lipase, doubt acute pancreatitis.  Suggest surgical consult in am.  Update: PCCM here to see-see their note. Another 1L IVF bolus ordered. Cancelled stim test. Started stress dose steroids. Agree with surgery consult.  LA down to 3.1.  After last bolus pt will have had a total of 4500cc boluses plus 100cc/hr for past 5 hours, plus 25 of Albumin. [facility], NP [facility]  CRITICAL CARE Performed by: Gardiner Barefoot Total critical care time: 80 minutes Critical care time was exclusive of separately billable procedures and treating other patients. Critical care was necessary to treat or prevent imminent or life-threatening deterioration. Critical care was time spent personally by me on the following activities: development of treatment plan with patient and/or surrogate as well as nursing, discussions with consultants, evaluation of patient's response to treatment, examination of patient, obtaining history from patient or surrogate, ordering and performing treatments and interventions, ordering and review of laboratory studies, ordering and review of radiographic studies, pulse oximetry and re-evaluation of patient's condition.

## 2018-09-24 NOTE — Consult Note (Signed)
PULMONARY / CRITICAL CARE MEDICINE   NAME:  Dominique [name], MRN:  [id number], DOB:  11-22-1980, LOS: 8 ADMISSION DATE:  09/08/2018, CONSULTATION DATE: 09/24/2018 REFERRING MD: Lionel December, CHIEF COMPLAINT: Hypotension  BRIEF HISTORY:    38 year old morbidly obese with history of Crohn's disease and ostomy with chronic hypotension now being transferred to the ICU with worsening hypotension lactic acidosis and acute kidney injury suspecting sepsis versus hypovolemia.   HISTORY OF PRESENT ILLNESS   Dominique [name] is a 38 year old lady with morbid obesity and history of Crohn's disease status post colectomy and diverting ostomy with multiple wounds around her stoma admitted on 09/05/2018 with abdominal pain thought to be related to pancreatitis but she had normal enzymes GI were consulted and thought that it is related to gastroparesis related to narcotics.  I was called tonight due to patient becoming more hypotensive her usual systolic blood pressure around 80 but tonight went down to 70s with lactic acidosis and leukocytosis.  CT abdomen was done which was showing some fluid in her abdomen suggestive of peritonitis versus hemorrhage.  Patient received 3-1/2 L with some improvement in her blood pressure to the 90s she still feels ill with complaints of abdominal pain denies any vomiting she states that she has been having decreased oral intake over the last few days.  Denies any fever no cough no chest pain.  Patient is being started on cefepime Flagyl and vancomycin.   SIGNIFICANT PAST MEDICAL HISTORY   History of Crohn's disease status post colectomy and diverting ostomy Morbid obesity Anxiety  SIGNIFICANT EVENTS:  Admitted to the hospital on 09/20/2018  Transferred to the ICU on 09/24/2018 with hypotension STUDIES:   CT abdomen 09/24/2018 1. New ascites about the liver is higher in attenuation than the liver itself, raising suspicion for hemorrhage or complex fluid. The patient's hematocrit has not  significantly changed recently; given the patient's symptoms and severe leukocytosis, would correlate clinically for evidence of peritonitis. 2. Previously noted cystic focus at the pancreatic tail is less well seen on the current study. On correlation with lab results, the patient's lipase is within normal limits, without definite evidence for pancreatitis. 3. Right lower quadrant ileostomy is grossly unremarkable in appearance, with herniation of the distal small bowel into the hernia. No evidence of bowel obstruction. 4. Trace right-sided pleural fluid, with associated atelectasis. CULTURES:  Blood culture  ANTIBIOTICS:  Cefepime 09/24/2018>> Flagyl 09/24/2018>> Vancomycin 09/24/2018>>  LINES/TUBES:    CONSULTANTS:    SUBJECTIVE:    CONSTITUTIONAL: BP (!) 75/38 (BP Location: Right Arm)   Pulse 91   Temp 98.1 F (36.7 C) (Oral)   Resp 18   Ht 5' 2"  (1.575 m)   Wt (!) 174.2 kg   LMP  (LMP Unknown) Comment: negative HCG quantitative 09/20/2018  SpO2 98%   BMI 70.23 kg/m   I/O last 3 completed shifts: In: 1120 [P.O.:1120] Out: 450 [Stool:450]        PHYSICAL EXAM: General: Morbidly obese toxic looking and looking Neuro: Alert oriented to time place and person cranial nerves on 12 are intact HEENT: Dry mucous membranes Cardiovascular: Normal heart sound no sounds or murmurs Lungs: Clear equal air sound bilaterally no crackles no wheezing Abdomen: Morbidly obese has ostomy in the right lower quadrant multiple wounds Musculoskeletal: No edema Skin: No rash  RESOLVED PROBLEM LIST   ASSESSMENT AND PLAN   Assessment: -Septic shock -Suspected hypovolemic shock from high output from her stoma and decreased intake -Acute kidney injury -Lactic acidosis -Suspected acute peritonitis to  rule out intra-abdominal hemorrhage -Morbid obesity -Chronic hypotension -History of Crohn's disease  Plan: -I will bolus with another liter of LR patient received 3 and half liters  already and is receiving albumin -Consider starting phenylephrine to keep map above 65 -Follow serial lactic acid -Serial BMP and follow urine output and renal function -Discussed with the hospitalist service patient would benefit from evaluation by general surgery especially with her CT abdomen finding -Serial CBC to rule out any drop in her hemoglobin with potential intra-abdominal bleed -I agree with the current antibiotics cefepime Flagyl and vancomycin -Follow cultures -Wound care assessment -I will start the patient on stress dose hydrocortisone 100 mg IV every 8 hours  I have spent 45 minutes of critical care time this time was spent bedside or in the unit this time was exclusive any billable procedures patient is needing intensive care due to septic shock requiring aggressive hydration possible need for vasopressors  SUMMARY OF TODAY'S PLAN:    Best Practice / Goals of Care / Disposition.   DVT PROPHYLAXIS: Lovenox SUP: NUTRITION: N.p.o. MOBILITY: Bedrest GOALS OF CARE: Full code FAMILY DISCUSSIONS: No family around DISPOSITION ICU admission  LABS  Glucose No results for input(s): GLUCAP in the last 168 hours.  BMET Recent Labs  Lab 09/21/18 2110 09/23/18 2052 09/24/18 0301  NA 136 131* 132*  K 3.3* 4.3 3.7  CL 110 108 111  CO2 19* 16* 14*  BUN 9 16 16   CREATININE 1.20* 2.34* 2.30*  GLUCOSE 117* 133* 109*    Liver Enzymes Recent Labs  Lab 09/20/18 0837 09/24/18 0301  AST 25 19  ALT 15 13  ALKPHOS 99 92  BILITOT 0.9 0.9  ALBUMIN 1.6* 1.2*    Electrolytes Recent Labs  Lab 09/21/18 0730 09/21/18 2110 09/23/18 2052 09/24/18 0301  CALCIUM 7.8* 7.7* 7.4* 6.7*  MG 1.8 1.8 1.7  --     CBC Recent Labs  Lab 09/21/18 0828 09/21/18 2110 09/24/18 0017  WBC 13.1* 15.8* 38.4*  HGB 11.1* 11.0* 10.9*  HCT 31.6* 33.2* 33.6*  PLT 265 292 186    ABG No results for input(s): PHART, PCO2ART, PO2ART in the last 168 hours.  Coag's No results for  input(s): APTT, INR in the last 168 hours.  Sepsis Markers Recent Labs  Lab 09/24/18 0017 09/24/18 0455  LATICACIDVEN 5.1* 3.1*    Cardiac Enzymes No results for input(s): TROPONINI, PROBNP in the last 168 hours.  PAST MEDICAL HISTORY :   She  has a past medical history of Acute acalculous cholecystitis (11/16/2014), Anal fistula, Anemia, Anxiety, Asthma, CHF (congestive heart failure) ([place]), Complication of anesthesia, Coronary artery disease, Diverticulitis of colon (11/17/2014), Headache, Hepatic steatosis (11/17/2014), Herpes, Hypertension, Morbid obesity ([place]) (03/22/2009), Neuropathy, OSA (obstructive sleep apnea) (05/02/2014), Seasonal allergies (06/13/2014), Sickle cell anemia ([place]), Sleep apnea, and Thyromegaly (05/02/2014).  PAST SURGICAL HISTORY:  She  has a past surgical history that includes c section 2011 (2011); Flexible sigmoidoscopy (N/A, 06/05/2015); laparotomy (N/A, 06/09/2015); laparotomy (N/A, 06/11/2015); Colostomy (N/A, 06/11/2015); Esophagogastroduodenoscopy (N/A, 07/19/2015); and Colonoscopy with propofol (N/A, 12/27/2015).  Allergies  Allergen Reactions  . Other Shortness Of Breath and Swelling    Tree nuts  . Penicillins Other (See Comments)    Unknown childhood allergy Has patient had a PCN reaction causing immediate rash, facial/tongue/throat swelling, SOB or lightheadedness with hypotension: Unknown Has patient had a PCN reaction causing severe rash involving mucus membranes or skin necrosis: Unknown Has patient had a PCN reaction that required hospitalization: Unknown Has patient  had a PCN reaction occurring within the last 10 years: Unknown If all of the above answers are "NO", then may proceed with Cephalosporin use.     No current facility-administered medications on file prior to encounter.    Current Outpatient Medications on File Prior to Encounter  Medication Sig  . acetaminophen (TYLENOL) 500 MG tablet Take 500 mg by mouth every 6 (six) hours as needed  for mild pain, moderate pain or headache.  . ferrous gluconate (FERGON) 324 MG tablet Take 1 tablet (324 mg total) by mouth 2 (two) times daily with a meal.  . methocarbamol (ROBAXIN) 500 MG tablet Take 1 tablet (500 mg total) by mouth every 6 (six) hours as needed for muscle spasms.  Marland Kitchen oxyCODONE (ROXICODONE) 15 MG immediate release tablet Take 1 tablet (15 mg total) by mouth every 4 (four) hours as needed for moderate pain.  Marland Kitchen tiZANidine (ZANAFLEX) 4 MG tablet Take 4 mg by mouth every 8 (eight) hours as needed for muscle spasms.  Marland Kitchen albuterol (PROVENTIL) (2.5 MG/3ML) 0.083% nebulizer solution Take 3 mLs (2.5 mg total) by nebulization 2 (two) times daily as needed for wheezing. (Patient not taking: Reported on 08/26/2018)  . Amino Acids-Protein Hydrolys (FEEDING SUPPLEMENT, PRO-STAT SUGAR FREE 64,) LIQD Take 30 mLs by mouth daily. (Patient not taking: Reported on 09/06/2018)  . bisacodyl (DULCOLAX) 5 MG EC tablet Take 1 tablet (5 mg total) by mouth daily as needed for moderate constipation. (Patient not taking: Reported on 09/13/2018)  . famotidine (PEPCID) 20 MG tablet Take 1 tablet (20 mg total) by mouth 2 (two) times daily.  . feeding supplement, ENSURE ENLIVE, (ENSURE ENLIVE) LIQD Take 237 mLs by mouth daily. (Patient not taking: Reported on 09/22/2018)  . metoprolol tartrate (LOPRESSOR) 25 MG tablet Take 0.5 tablets (12.5 mg total) by mouth 2 (two) times daily.  . midodrine (PROAMATINE) 5 MG tablet Take 1 tablet (5 mg total) by mouth 3 (three) times daily with meals.  . Multiple Vitamin (MULTIVITAMIN WITH MINERALS) TABS tablet Take 1 tablet by mouth daily. (Patient not taking: Reported on 09/19/2018)  . nutrition supplement, JUVEN, (JUVEN) PACK Take 1 packet by mouth 2 (two) times daily between meals. (Patient not taking: Reported on 09/13/2018)  . pantoprazole (PROTONIX) 40 MG tablet Take 1 tablet (40 mg total) by mouth 2 (two) times daily before a meal.  . polyethylene glycol (MIRALAX /  GLYCOLAX) packet Take 17 g by mouth daily. (Patient not taking: Reported on 09/18/2018)  . senna-docusate (SENOKOT-S) 8.6-50 MG tablet Take 1 tablet by mouth 2 (two) times daily. (Patient not taking: Reported on 09/04/2018)    FAMILY HISTORY:   Her family history includes Allergies in her mother; Asthma in her brother; Cancer in her paternal grandmother; Diabetes in her maternal grandmother; Emphysema in her paternal grandfather; Hypertension in her father, maternal grandfather, maternal grandmother, mother, and sister.  SOCIAL HISTORY:  She  reports that she has been smoking cigarettes. She started smoking about 4 years ago. She has a 2.00 pack-year smoking history. She has never used smokeless tobacco. She reports that she does not drink alcohol or use drugs.  REVIEW OF SYSTEMS:    All 11 point system review were unremarkable other than what is mentioned history of present illness

## 2018-09-24 NOTE — Progress Notes (Signed)
Pharmacy Antibiotic Note  Dominique [name] is a 38 y.o. female admitted on 09/17/2018 with Wound infection.  Pharmacy has been consulted for Vancomycin,cefepime dosing.  Plan: Vancomycin 2530m iv x1, then 15055miv q48hr  Goal AUC = 400 - 500 for all indications, except meningitis (goal AUC > 500 and Cmin 15-20 mcg/mL)  Cefepime 2gm iv q12hr   Height: 5' 2"  (157.5 cm) Weight: (!) 384 lb (174.2 kg) IBW/kg (Calculated) : 50.1  Temp (24hrs), Avg:98 F (36.7 C), Min:97.7 F (36.5 C), Max:98.1 F (36.7 C)  Recent Labs  Lab 09/20/18 0837 09/21/18 0730 09/21/18 0828 09/21/18 2110 09/23/18 2052 09/24/18 0017 09/24/18 0301 09/24/18 0455  WBC 11.4*  --  13.1* 15.8*  --  38.4*  --   --   CREATININE 1.13* 1.13*  --  1.20* 2.34*  --  2.30*  --   LATICACIDVEN  --   --   --   --   --  5.1*  --  3.1*    Estimated Creatinine Clearance: 52.2 mL/min (A) (by C-G formula based on SCr of 2.3 mg/dL (H)).    Allergies  Allergen Reactions  . Other Shortness Of Breath and Swelling    Tree nuts  . Penicillins Other (See Comments)    Unknown childhood allergy Has patient had a PCN reaction causing immediate rash, facial/tongue/throat swelling, SOB or lightheadedness with hypotension: Unknown Has patient had a PCN reaction causing severe rash involving mucus membranes or skin necrosis: Unknown Has patient had a PCN reaction that required hospitalization: Unknown Has patient had a PCN reaction occurring within the last 10 years: Unknown If all of the above answers are "NO", then may proceed with Cephalosporin use.     Antimicrobials this admission: Cefepime 09/24/2018 >> Flagyl 09/24/2018 >> Vancomycin 09/24/2018 >>   Dose adjustments this admission: -  Microbiology results:   Thank you for allowing pharmacy to be a part of this patient's care.  GrNani Skillernrowford 09/24/2018 6:50 AM

## 2018-09-25 ENCOUNTER — Inpatient Hospital Stay (HOSPITAL_COMMUNITY): Payer: Medicaid Other

## 2018-09-25 DIAGNOSIS — K50118 Crohn's disease of large intestine with other complication: Secondary | ICD-10-CM

## 2018-09-25 DIAGNOSIS — Z9989 Dependence on other enabling machines and devices: Secondary | ICD-10-CM

## 2018-09-25 DIAGNOSIS — Z6841 Body Mass Index (BMI) 40.0 and over, adult: Secondary | ICD-10-CM

## 2018-09-25 DIAGNOSIS — G4733 Obstructive sleep apnea (adult) (pediatric): Secondary | ICD-10-CM

## 2018-09-25 LAB — CBC
HCT: 31 % — ABNORMAL LOW (ref 36.0–46.0)
Hemoglobin: 10.6 g/dL — ABNORMAL LOW (ref 12.0–15.0)
MCH: 26.8 pg (ref 26.0–34.0)
MCHC: 34.2 g/dL (ref 30.0–36.0)
MCV: 78.5 fL — ABNORMAL LOW (ref 80.0–100.0)
Platelets: 206 10*3/uL (ref 150–400)
RBC: 3.95 MIL/uL (ref 3.87–5.11)
RDW: 16.7 % — ABNORMAL HIGH (ref 11.5–15.5)
WBC: 27.6 10*3/uL — ABNORMAL HIGH (ref 4.0–10.5)
nRBC: 0 % (ref 0.0–0.2)

## 2018-09-25 LAB — CBC WITH DIFFERENTIAL/PLATELET
Abs Immature Granulocytes: 0.67 10*3/uL — ABNORMAL HIGH (ref 0.00–0.07)
Basophils Absolute: 0.1 10*3/uL (ref 0.0–0.1)
Basophils Relative: 0 %
Eosinophils Absolute: 0.1 10*3/uL (ref 0.0–0.5)
Eosinophils Relative: 0 %
HCT: 31.4 % — ABNORMAL LOW (ref 36.0–46.0)
Hemoglobin: 10.7 g/dL — ABNORMAL LOW (ref 12.0–15.0)
Immature Granulocytes: 2 %
Lymphocytes Relative: 2 %
Lymphs Abs: 0.8 10*3/uL (ref 0.7–4.0)
MCH: 27.4 pg (ref 26.0–34.0)
MCHC: 34.1 g/dL (ref 30.0–36.0)
MCV: 80.3 fL (ref 80.0–100.0)
Monocytes Absolute: 1.1 10*3/uL — ABNORMAL HIGH (ref 0.1–1.0)
Monocytes Relative: 3 %
NRBC: 0 % (ref 0.0–0.2)
Neutro Abs: 39.2 10*3/uL — ABNORMAL HIGH (ref 1.7–7.7)
Neutrophils Relative %: 93 %
Platelets: 213 10*3/uL (ref 150–400)
RBC: 3.91 MIL/uL (ref 3.87–5.11)
RDW: 16.7 % — AB (ref 11.5–15.5)
WBC: 41.9 10*3/uL — ABNORMAL HIGH (ref 4.0–10.5)

## 2018-09-25 LAB — BASIC METABOLIC PANEL
Anion gap: 7 (ref 5–15)
BUN: 25 mg/dL — AB (ref 6–20)
CO2: 16 mmol/L — ABNORMAL LOW (ref 22–32)
CREATININE: 2.46 mg/dL — AB (ref 0.44–1.00)
Calcium: 7.2 mg/dL — ABNORMAL LOW (ref 8.9–10.3)
Chloride: 111 mmol/L (ref 98–111)
GFR calc Af Amer: 28 mL/min — ABNORMAL LOW (ref >60)
GFR calc non Af Amer: 24 mL/min — ABNORMAL LOW (ref >60)
Glucose, Bld: 121 mg/dL — ABNORMAL HIGH (ref 70–99)
Potassium: 4 mmol/L (ref 3.5–5.1)
Sodium: 134 mmol/L — ABNORMAL LOW (ref 135–145)

## 2018-09-25 LAB — COMPREHENSIVE METABOLIC PANEL
ALT: 13 U/L (ref 0–44)
ANION GAP: 8 (ref 5–15)
AST: 12 U/L — ABNORMAL LOW (ref 15–41)
Albumin: 1.5 g/dL — ABNORMAL LOW (ref 3.5–5.0)
Alkaline Phosphatase: 106 U/L (ref 38–126)
BUN: 25 mg/dL — ABNORMAL HIGH (ref 6–20)
CO2: 15 mmol/L — ABNORMAL LOW (ref 22–32)
Calcium: 7.3 mg/dL — ABNORMAL LOW (ref 8.9–10.3)
Chloride: 112 mmol/L — ABNORMAL HIGH (ref 98–111)
Creatinine, Ser: 2.68 mg/dL — ABNORMAL HIGH (ref 0.44–1.00)
GFR calc non Af Amer: 22 mL/min — ABNORMAL LOW (ref >60)
GFR, EST AFRICAN AMERICAN: 25 mL/min — AB (ref >60)
Glucose, Bld: 115 mg/dL — ABNORMAL HIGH (ref 70–99)
Potassium: 4.1 mmol/L (ref 3.5–5.1)
Sodium: 135 mmol/L (ref 135–145)
TOTAL PROTEIN: 4.6 g/dL — AB (ref 6.5–8.1)
Total Bilirubin: 0.9 mg/dL (ref 0.3–1.2)

## 2018-09-25 LAB — URINE CULTURE: Culture: NO GROWTH

## 2018-09-25 LAB — SODIUM, URINE, RANDOM: Sodium, Ur: 45 mmol/L

## 2018-09-25 LAB — MRSA PCR SCREENING: MRSA by PCR: NEGATIVE

## 2018-09-25 MED ORDER — SODIUM BICARBONATE 650 MG PO TABS
1300.0000 mg | ORAL_TABLET | Freq: Two times a day (BID) | ORAL | Status: DC
  Administered 2018-09-25 – 2018-09-26 (×2): 1300 mg via ORAL
  Filled 2018-09-25 (×2): qty 2

## 2018-09-25 MED ORDER — LACTATED RINGERS IV BOLUS
500.0000 mL | Freq: Once | INTRAVENOUS | Status: AC
  Administered 2018-09-25: 500 mL via INTRAVENOUS

## 2018-09-25 MED ORDER — HYDROMORPHONE HCL 1 MG/ML IJ SOLN
0.5000 mg | Freq: Once | INTRAMUSCULAR | Status: AC
  Administered 2018-09-25: 0.5 mg via INTRAVENOUS

## 2018-09-25 MED ORDER — HEPARIN SODIUM (PORCINE) 5000 UNIT/ML IJ SOLN
5000.0000 [IU] | Freq: Three times a day (TID) | INTRAMUSCULAR | Status: DC
  Administered 2018-09-26 – 2018-09-29 (×9): 5000 [IU] via SUBCUTANEOUS
  Filled 2018-09-25 (×9): qty 1

## 2018-09-25 MED ORDER — MAGNESIUM SULFATE 2 GM/50ML IV SOLN
2.0000 g | Freq: Once | INTRAVENOUS | Status: AC
  Administered 2018-09-25: 2 g via INTRAVENOUS
  Filled 2018-09-25: qty 50

## 2018-09-25 MED ORDER — HYDROMORPHONE HCL 1 MG/ML IJ SOLN
0.5000 mg | Freq: Two times a day (BID) | INTRAMUSCULAR | Status: DC | PRN
  Administered 2018-09-26: 0.5 mg via INTRAVENOUS
  Filled 2018-09-25 (×2): qty 1

## 2018-09-25 MED ORDER — HYDROCORTISONE NA SUCCINATE PF 100 MG IJ SOLR
50.0000 mg | Freq: Three times a day (TID) | INTRAMUSCULAR | Status: DC
  Administered 2018-09-25 – 2018-09-29 (×11): 50 mg via INTRAVENOUS
  Filled 2018-09-25 (×11): qty 2

## 2018-09-25 NOTE — Consult Note (Signed)
WOC consult requested for wounds and ileostomy; this was already performed.  Please refer to progress notes on 12/25 for wound descriptions, and topical treatment orders have been provided for bedside nurses to perform and change ileostomy pouch PRN. Please re-consult if further assistance is needed.  Thank-you,  Julien Girt MSN, [place], [place], [place], [place]

## 2018-09-25 NOTE — Progress Notes (Signed)
[facility] Progress Note     Subjective: CC-  Patient resting comfortably. States that she has no abdominal pain at rest. Denies any n/v. Ileostomy pouch changed this morning, but patient states that she did have some air/stool in pouch prior to change. WBC up to 41.9. remains on stress dose steroids and norepinephrine. Hemoglobin stable.  Objective: Vital signs in last 24 hours: Temp:  [97.7 F (36.5 C)-98.7 F (37.1 C)] 97.7 F (36.5 C) (01/03 0400) Pulse Rate:  [78-96] 88 (01/03 0700) Resp:  [14-18] 15 (01/03 0700) BP: (79-130)/(33-64) 109/54 (01/03 0400) SpO2:  [96 %-100 %] 97 % (01/03 0700) Arterial Line BP: (69-136)/(47-69) 69/63 (01/03 0700) Last BM Date: 09/24/18  Intake/Output from previous day: 01/02 0701 - 01/03 0700 In: 2789 [P.O.:150; I.V.:1212.3; IV Piggyback:1426.7] Out: 580 [Urine:330; Stool:250] Intake/Output this shift: No intake/output data recorded.  PE: Gen:  Alert, NAD, pleasant HEENT: EOM's intact, pupils equal and round Card:  RRR Pulm:  CTAB, no W/R/R, effort normal Abd: obese, soft, +BS, cdi dressing to midline and abdominal wounds, mild TTP RUQ around ileostomy and RLQ without rebound or guarding, ileostomy pink with clean/new pouch in place Ext:  Calves soft and nontender Psych: A&Ox3  Skin: no rashes noted, warm and dry  Lab Results:  Recent Labs    09/24/18 1215 09/25/18 0000  WBC 41.0* 41.9*  HGB 10.4* 10.7*  HCT 30.8* 31.4*  PLT 196 213   BMET Recent Labs    09/24/18 0301 09/24/18 1215  NA 132* 133*  K 3.7 4.3  CL 111 111  CO2 14* 16*  GLUCOSE 109* 113*  BUN 16 19  CREATININE 2.30* 2.33*  CALCIUM 6.7* 7.0*   PT/INR No results for input(s): LABPROT, INR in the last 72 hours. CMP     Component Value Date/Time   NA 133 (L) 09/24/2018 1215   K 4.3 09/24/2018 1215   CL 111 09/24/2018 1215   CO2 16 (L) 09/24/2018 1215   GLUCOSE 113 (H) 09/24/2018 1215   BUN 19 09/24/2018 1215   CREATININE 2.33 (H)  09/24/2018 1215   CREATININE 0.52 12/29/2014 1557   CALCIUM 7.0 (L) 09/24/2018 1215   PROT 3.8 (L) 09/24/2018 0301   ALBUMIN 1.2 (L) 09/24/2018 0301   AST 19 09/24/2018 0301   ALT 13 09/24/2018 0301   ALKPHOS 92 09/24/2018 0301   BILITOT 0.9 09/24/2018 0301   GFRNONAA 26 (L) 09/24/2018 1215   GFRAA 30 (L) 09/24/2018 1215   Lipase     Component Value Date/Time   LIPASE 19 09/24/2018 0301       Studies/Results: Ct Abdomen Pelvis Wo Contrast  Result Date: 09/24/2018 CLINICAL DATA:  Acute onset of generalized weakness and abdominal pain. Question of recent pancreatitis. Leukocytosis. Acute renal failure. EXAM: CT ABDOMEN AND PELVIS WITHOUT CONTRAST TECHNIQUE: Multidetector CT imaging of the abdomen and pelvis was performed following the standard protocol without IV contrast. COMPARISON:  CT of the abdomen and pelvis from 08/28/2018 FINDINGS: Lower chest: Trace right-sided pleural fluid is noted, with associated atelectasis. The visualized portions of the mediastinum are unremarkable. Hepatobiliary: The liver is grossly unremarkable in appearance. The gallbladder is not well characterized due to surrounding ascites. The common bile duct is normal in caliber. New ascites about the liver is higher in attenuation than the liver itself, raising suspicion for hemorrhage or complex fluid. Pancreas: The previously noted cystic focus at the pancreatic tail is less well seen on the current study. On correlation with lab results, the patient's  lipase is within normal limits, without definite evidence for pancreatitis. Spleen: The spleen is unremarkable in appearance. Adrenals/Urinary Tract: The adrenal glands are unremarkable in appearance. The kidneys are within normal limits. There is no evidence of hydronephrosis. No renal or ureteral stones are seen. No perinephric stranding is appreciated. Stomach/Bowel: The patient is status post resection of much of the colon. Relatively high attenuation fluid is  noted tracking about the bowel. Visualized small bowel loops are largely decompressed and unremarkable in appearance. A right lower quadrant ileostomy is grossly unremarkable in appearance, with herniation of the distal small bowel into the hernia. There is no evidence of bowel obstruction. The patient's Hartmann's pouch is grossly unremarkable in appearance. Vascular/Lymphatic: The abdominal aorta is unremarkable in appearance. The inferior vena cava is grossly unremarkable. No retroperitoneal lymphadenopathy is seen. No pelvic sidewall lymphadenopathy is identified. Reproductive: The bladder is decompressed, with a Foley catheter in place. The uterus is grossly unremarkable in appearance. No suspicious adnexal masses are seen. Other: No additional soft tissue abnormalities are seen. Musculoskeletal: No acute osseous abnormalities are identified. The visualized musculature is unremarkable in appearance. IMPRESSION: 1. New ascites about the liver is higher in attenuation than the liver itself, raising suspicion for hemorrhage or complex fluid. The patient's hematocrit has not significantly changed recently; given the patient's symptoms and severe leukocytosis, would correlate clinically for evidence of peritonitis. 2. Previously noted cystic focus at the pancreatic tail is less well seen on the current study. On correlation with lab results, the patient's lipase is within normal limits, without definite evidence for pancreatitis. 3. Right lower quadrant ileostomy is grossly unremarkable in appearance, with herniation of the distal small bowel into the hernia. No evidence of bowel obstruction. 4. Trace right-sided pleural fluid, with associated atelectasis. These results were called by telephone at the time of interpretation on 09/24/2018 at 4:39 am to [facility] at [facility] ICU, who verbally acknowledged these results. Electronically Signed   By: Garald Balding M.D.   On: 09/24/2018 04:43   Dg Chest Port 1  View  Result Date: 09/24/2018 CLINICAL DATA:  Central line placement EXAM: PORTABLE CHEST 1 VIEW COMPARISON:  Chest radiograph from earlier today. FINDINGS: Right internal jugular central venous catheter terminates at the cavoatrial junction. Stable cardiomediastinal silhouette with mild cardiomegaly. No pneumothorax. No pleural effusion. Low lung volumes. No overt pulmonary edema. No acute consolidative airspace disease. IMPRESSION: 1. Right internal jugular central venous catheter terminates at the cavoatrial junction. No pneumothorax. 2. Low lung volumes. Stable mild cardiomegaly without overt pulmonary edema. Electronically Signed   By: Ilona Sorrel M.D.   On: 09/24/2018 14:09   Dg Chest Port 1 View  Result Date: 09/24/2018 CLINICAL DATA:  Dyspnea and leukocytosis EXAM: PORTABLE CHEST 1 VIEW COMPARISON:  09/11/2018 chest radiograph. FINDINGS: Stable leftward displacement of the trachea at the thoracic inlet. Stable cardiomediastinal silhouette with mild cardiomegaly. No pneumothorax. No pleural effusion. Lungs appear clear, with no acute consolidative airspace disease and no pulmonary edema. IMPRESSION: Stable mild cardiomegaly without pulmonary edema. No active pulmonary disease. Stable leftward displacement of the trachea at the thoracic inlet suggesting enlarged right thyroid lobe. Electronically Signed   By: Ilona Sorrel M.D.   On: 09/24/2018 02:01    Anti-infectives: Anti-infectives (From admission, onward)   Start     Dose/Rate Route Frequency Ordered Stop   09/26/18 0600  vancomycin (VANCOCIN) 1,500 mg in sodium chloride 0.9 % 500 mL IVPB     1,500 mg 250 mL/hr over 120 Minutes  Intravenous Every 48 hours 09/24/18 0649     09/24/18 0530  vancomycin (VANCOCIN) 2,500 mg in sodium chloride 0.9 % 500 mL IVPB     2,500 mg 250 mL/hr over 120 Minutes Intravenous  Once 09/24/18 0529 09/24/18 1005   09/24/18 0300  metroNIDAZOLE (FLAGYL) IVPB 500 mg     500 mg 100 mL/hr over 60 Minutes Intravenous  Every 8 hours 09/24/18 0246     09/24/18 0300  ceFEPIme (MAXIPIME) 2 g in sodium chloride 0.9 % 100 mL IVPB     2 g 200 mL/hr over 30 Minutes Intravenous Every 12 hours 09/24/18 0248         Assessment/Plan Morbid obesity Chronic hypotension CKD-II H/o Crohns colitis s/p exploratory laparotomy, drainage intra-abdominal abscess, partial colectomy, colostomy 05/2015 by Dr. Grandville Silos for a perforated descending colon, and s/p total colectomy/ileostomy 2018 at [facility]  Lactic acidosis - resolved Leukocytosis - WBC up 41.9, afebrile AKI - Cr 2.33 from 2.3, low UOP, getting fluid boluses per CCM Suspected hypovolemic shock Abdominal pain New ascites/perihepatic fluid collection/?ruptured pancreatic cyst - CT scan abdomen/pelvis 1/2 shows new ascites about the liver is higher in attenuation than the liver itself, raising suspicion for hemorrhage or complex fluid - hemoglobin stable  ID - maxipime/flagyl/vancomycin 1/2>> VTE - SCDs, lovenox FEN - IVF, NPO Foley - in place   Plan - Minimally tender on abdominal exam. Ok for clear liquids from surgical standpoint. Plan to repeat CT scan in a couple of days for comparison.   LOS: 9 days    Wellington Hampshire , [facility] 09/25/2018, 8:01 AM Pager: [phone] Mon 7:00 am -11:30 AM Tues-Fri 7:00 am-4:30 pm Sat-Sun 7:00 am-11:30 am

## 2018-09-25 NOTE — Progress Notes (Signed)
[facility] Progress Note Patient Name: Dominique [name] DOB: 03/20/1981 MRN: [id number]   Date of Service  09/25/2018  HPI/Events of Note  UOP low since yeterday. LA down to 1.6 Received over 4 lit for pancreatitis.  Camera: Resting, stable VS. On Levophed MAP > 65. HR fine. Discussed with bed side RN. Lungs clear.   eICU Interventions  LR 500 ml bolus over an hour. Continue Ns at 100. Wean Levo as tolerated.         Elmer Sow 09/25/2018, 1:30 AM

## 2018-09-25 NOTE — Progress Notes (Signed)
Pt refuses CPAP QHS.

## 2018-09-25 NOTE — Consult Note (Addendum)
WOC follow-up:   WOC consult was performed on 12/25; refer to previous consult notes for abd/ breast/groin/thigh/back skin folds.  Wounds remain red, moist and macerated, full thickness tissue loss and mod amt tan drainage. Continue present plan of care with Aquacel and Interdry to absorb drainage and provide antimicrobial benefits.  Requested to assess bilat buttocks at this time.  Pt has been critically ill and immobile and has been refusing to be turned or cleaned multiple times, according to the bedside nursing staff.  4 nurses assisted with turning and bathing and patient was screaming and crying in pain.  She had several layers of wet pads underneath her and this has caused severe moisture associated skin damage to bilat buttocks. Each side is approx 10X10cm, red, moist, mod amt bloody drainage, strong foul odor and tan drainage from posterior back skin folds were the previous Interdry and dressings had soaked through.  Strongly encouraged patient to allow more frequent turning and cleaning of the affected areas to prevent further decline; they are high risk to evolve into pressure injuries. Apply barrier cream and antifungal powder to protect and promote healing. Pt is on a bariatric air mattress to reduce pressure.  No family present to discuss plan of care. Please re-consult if further assistance is needed.  Thank-you,  Julien Girt MSN, [place], [place], [place], [place]

## 2018-09-25 NOTE — Progress Notes (Signed)
PT Cancellation Note  Patient Details Name: Dominique [name] MRN: [id number] DOB: [birth year]   Cancelled Treatment:    Reason Eval/Treat Not Completed: Pain limiting ability to participate RN reports pt's dressing just changed and pt is in a lot of pain.  Will check back as schedule permits.    Dominique [name],Dominique [name] 09/25/2018, 1:43 PM Carmelia Bake, PT, DPT Acute Rehabilitation Services Office: [phone] Pager: [phone]

## 2018-09-25 NOTE — Consult Note (Addendum)
PULMONARY / CRITICAL CARE MEDICINE   NAME:  Dominique [name], MRN:  [id number], DOB:  1981/08/07, LOS: 9 ADMISSION DATE:  08/30/2018, CONSULTATION DATE: 09/24/2018 REFERRING MD: Lionel December, CHIEF COMPLAINT: Hypotension  BRIEF HISTORY:    38 year old morbidly obese with history of Crohn's disease and diverting ostomy with chronic hypotension  transferred to the ICU in 09/24/2018 with worsening hypotension lactic acidosis and acute kidney injury suspecting sepsis versus hypovolemia.PCCM have been asked to assume care.   HISTORY OF PRESENT ILLNESS   Dominique [name] is a 38 year old lady with morbid obesity and history of Crohn's disease status post colectomy and diverting ostomy with multiple wounds around her stoma admitted on 09/01/2018 with abdominal pain thought to be related to pancreatitis but she had normal enzymes. 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  PCCM  was called 1/2 early am  due to patient becoming more hypotensive her usual systolic blood pressure around 80 but tonight went down to 70s with lactic acidosis and leukocytosis.  CT abdomen was done which was showing some fluid in her abdomen suggestive of peritonitis versus hemorrhage.  Patient received 4 L  L with some improvement in her blood pressure to the 90s she still feels ill with complaints of abdominal pain denies any vomiting she states that she has been having decreased oral intake over the last few days.  Denies any fever no cough no chest pain.  Patient is being started on cefepime Flagyl and vancomycin.   SIGNIFICANT PAST MEDICAL HISTORY   History of Crohn's disease status post colectomy and diverting ostomy Chronic hypotension ( baseline BP 80 systolic) Morbid obesity Anxiety  SIGNIFICANT EVENTS:  Admitted to the hospital on 09/04/2018  Transferred to the ICU on 09/24/2018 with hypotension STUDIES:   CT abdomen 09/24/2018 1. New ascites about the liver is higher in  attenuation than the liver itself, raising suspicion for hemorrhage or complex fluid. The patient's hematocrit has not significantly changed recently; given the patient's symptoms and severe leukocytosis, would correlate clinically for evidence of peritonitis. 2. Previously noted cystic focus at the pancreatic tail is less well seen on the current study. On correlation with lab results, the patient's lipase is within normal limits, without definite evidence for pancreatitis. 3. Right lower quadrant ileostomy is grossly unremarkable in appearance, with herniation of the distal small bowel into the hernia. No evidence of bowel obstruction. 4. Trace right-sided pleural fluid, with associated atelectasis. CULTURES:  Blood culture 1/2>> Wound Culture Aerobic/ anaerobic ( Abdomen) GS>> rare gram + cocci 1/2 Urine Culture>> No Growth ANTIBIOTICS:  Cefepime 09/24/2018>> Flagyl 09/24/2018>> Vancomycin 09/24/2018>>  LINES/TUBES:  R IJ CVC 09/24/2018>> L Radial A line 09/24/2018>>  CONSULTANTS:  09/24/2018>> Surgery  SUBJECTIVE:  States she feels better. She is very sleepy, but alert to person place and time. States she has a little abdominal pain, but that it is better than yesterday ( 09/24/2018)  CONSTITUTIONAL: BP (!) 109/54 (BP Location: Right Arm)   Pulse 88   Temp 97.6 F (36.4 C) (Oral)   Resp 15   Ht 5' 2"  (1.575 m)   Wt (!) 174.2 kg   LMP  (LMP Unknown) Comment: negative HCG quantitative 09/06/2018  SpO2 97%   BMI 70.23 kg/m   I/O last 3 completed shifts: In: 2849 [P.O.:210; I.V.:1212.3; IV Piggyback:1426.7] Out: 865 [Urine:465; Stool:400]  CVP:  [7 mmHg-9 mmHg] 7 mmHg     PHYSICAL EXAM: General: Morbidly obese female, lethargic, but arousable, supine in bed  on RA with adequate sats Neuro: Alert oriented to person, place and time : cranial nerves 1- 12 are intact HEENT:NCAT,  Dry mucous membranes, thick neck Cardiovascular: S1. S2, Distant, RRR, tachy,  No MTG Lungs:  Bilateral chest excursion,BS are clear but diminished, and distant, no crackles no wheezing noted Abdomen: Morbidly obese has ostomy in the right lower quadrant, stoma is pink,  multiple wounds Musculoskeletal: No edema, morbidly obese extremities Skin: No rash, no lesions, multiple skin wounds  RESOLVED PROBLEM LIST   ASSESSMENT AND PLAN   Assessment: -Septic shock vs  hypovolemic shock from high output from her stoma and decreased intake --Chronic hypotension  - Lactate 5.1 on 1/2>> 1/3 is 1.4 - PCT 0.98 on 1/2 - Leukocytosis WBC 41.9 - Afebrile - Has received 4 L fluids last 24 - CVP is 3-4, ? Accuracy is ? With morbid obesity    Plan - Continue NS at 100cc/hr - Bolus prn low UO - Titrate/ wean  Levo for MAP of > 65 - Source ID and control - Continue Cefepime, Vanc, and Flagyl - Trend micro - Continue Midodrine 10 mg BID - Trend Lactate - Continue stress steroids  Respiratory Protecting airway Plan CXR daily to assess for edema with large volume resuscitation Monitor RR and Sats with goal > 94%    -Acute kidney injury - Hyponatremia - Hypomag>> repleted 1/2 - Creatinine 2.33 - Diminished UO Plan: BMET now BMET daily Check Mag 1/4 Replete electrolytes prn Maintain renal perfusion Monitor UO/ Strict I&O Consider renal US to RO obstruction if does not resolve with IVF  - Lactic acidosis - Improving with hydration - Plan - Continue NS at 100 cc/ hr - Levo for MAP of > 65 - Trend Lactate - ABG prn   ID -Suspected acute peritonitis to rule out intra-abdominal hemorrhage - Hx Crohn's - Diverting colostomy - Multiple wounds - PCT .98 on 1/3 - Afebrile - Leukocytosis - Multiple abdominal wounds  Plan - Consult to wound RN - Trend fever curve and WBC - Continue ABX as above - Trend PCT - Blood CX 1/3  - Follow Micro - Culture as is clinically indicated   Hematology rule out intra-abdominal hemorrhage ( CT 09/24/2018) No bleeding or bruising per  abdomen Plan Trend serial CBC Monitor for obvious bleeding Transfuse for HGB < 7  -History of Crohn's disease Plan Monitor ostomy output Replete output as needed   PCCM APP time  45 minutes  Will transition care to [facility] 09/26/2018 as patient has been weaned off Levofed  SUMMARY OF TODAY'S PLAN:  Continue to wean pressors for MAP of > 65 NS at 100 cc per hour Continue broad spectrum ABX for now Follow micro Trend lactate and PCT Wound Care RN to assess wounds Monitor ostomy output   Best Practice / Goals of Care / Disposition.   DVT PROPHYLAXIS: Lovenox SUP: NUTRITION: N.p.o. MOBILITY: Bedrest GOALS OF CARE: Full code FAMILY DISCUSSIONS: No family around DISPOSITION ICU / SDU  LABS  Glucose Recent Labs  Lab 09/24/18 2159  GLUCAP 89    BMET Recent Labs  Lab 09/23/18 2052 09/24/18 0301 09/24/18 1215  NA 131* 132* 133*  K 4.3 3.7 4.3  CL 108 111 111  CO2 16* 14* 16*  BUN 16 16 19   CREATININE 2.34* 2.30* 2.33*  GLUCOSE 133* 109* 113*    Liver Enzymes Recent Labs  Lab 09/20/18 0837 09/24/18 0301  AST 25 19  ALT 15 13  ALKPHOS 99 92  BILITOT  0.9 0.9  ALBUMIN 1.6* 1.2*    Electrolytes Recent Labs  Lab 09/21/18 2110 09/23/18 2052 09/24/18 0301 09/24/18 0934 09/24/18 1215  CALCIUM 7.7* 7.4* 6.7*  --  7.0*  MG 1.8 1.7  --  1.5*  --     CBC Recent Labs  Lab 09/24/18 0649 09/24/18 1215 09/25/18 0000  WBC 35.9* 41.0* 41.9*  HGB 9.7* 10.4* 10.7*  HCT 29.4* 30.8* 31.4*  PLT 200 196 213    ABG Recent Labs  Lab 09/24/18 0909  PHART 7.254*  PCO2ART 36.5  PO2ART 106    Coag's No results for input(s): APTT, INR in the last 168 hours.  Sepsis Markers Recent Labs  Lab 09/24/18 0455 09/24/18 0934 09/24/18 1501  LATICACIDVEN 3.1* 2.2* 1.4    Cardiac Enzymes No results for input(s): TROPONINI, PROBNP in the last 168 hours.  PAST MEDICAL HISTORY :   She  has a past medical history of Acute acalculous cholecystitis (11/16/2014),  Anal fistula, Anemia, Anxiety, Asthma, CHF (congestive heart failure) ([place]), Complication of anesthesia, Coronary artery disease, Diverticulitis of colon (11/17/2014), Headache, Hepatic steatosis (11/17/2014), Herpes, Hypertension, Morbid obesity ([place]) (03/22/2009), Neuropathy, OSA (obstructive sleep apnea) (05/02/2014), Seasonal allergies (06/13/2014), Sickle cell anemia ([place]), Sleep apnea, and Thyromegaly (05/02/2014).  PAST SURGICAL HISTORY:  She  has a past surgical history that includes c section 2011 (2011); Flexible sigmoidoscopy (N/A, 06/05/2015); laparotomy (N/A, 06/09/2015); laparotomy (N/A, 06/11/2015); Colostomy (N/A, 06/11/2015); Esophagogastroduodenoscopy (N/A, 07/19/2015); and Colonoscopy with propofol (N/A, 12/27/2015).  Allergies  Allergen Reactions  . Other Shortness Of Breath and Swelling    Tree nuts  . Penicillins Other (See Comments)    Unknown childhood allergy Has patient had a PCN reaction causing immediate rash, facial/tongue/throat swelling, SOB or lightheadedness with hypotension: Unknown Has patient had a PCN reaction causing severe rash involving mucus membranes or skin necrosis: Unknown Has patient had a PCN reaction that required hospitalization: Unknown Has patient had a PCN reaction occurring within the last 10 years: Unknown If all of the above answers are "NO", then may proceed with Cephalosporin use.     No current facility-administered medications on file prior to encounter.    Current Outpatient Medications on File Prior to Encounter  Medication Sig  . acetaminophen (TYLENOL) 500 MG tablet Take 500 mg by mouth every 6 (six) hours as needed for mild pain, moderate pain or headache.  . ferrous gluconate (FERGON) 324 MG tablet Take 1 tablet (324 mg total) by mouth 2 (two) times daily with a meal.  . methocarbamol (ROBAXIN) 500 MG tablet Take 1 tablet (500 mg total) by mouth every 6 (six) hours as needed for muscle spasms.  Marland Kitchen oxyCODONE (ROXICODONE) 15 MG immediate  release tablet Take 1 tablet (15 mg total) by mouth every 4 (four) hours as needed for moderate pain.  Marland Kitchen tiZANidine (ZANAFLEX) 4 MG tablet Take 4 mg by mouth every 8 (eight) hours as needed for muscle spasms.  Marland Kitchen albuterol (PROVENTIL) (2.5 MG/3ML) 0.083% nebulizer solution Take 3 mLs (2.5 mg total) by nebulization 2 (two) times daily as needed for wheezing. (Patient not taking: Reported on 08/29/2018)  . Amino Acids-Protein Hydrolys (FEEDING SUPPLEMENT, PRO-STAT SUGAR FREE 64,) LIQD Take 30 mLs by mouth daily. (Patient not taking: Reported on 09/14/2018)  . bisacodyl (DULCOLAX) 5 MG EC tablet Take 1 tablet (5 mg total) by mouth daily as needed for moderate constipation. (Patient not taking: Reported on 09/03/2018)  . famotidine (PEPCID) 20 MG tablet Take 1 tablet (20 mg total) by mouth 2 (  two) times daily.  . feeding supplement, ENSURE ENLIVE, (ENSURE ENLIVE) LIQD Take 237 mLs by mouth daily. (Patient not taking: Reported on 08/29/2018)  . metoprolol tartrate (LOPRESSOR) 25 MG tablet Take 0.5 tablets (12.5 mg total) by mouth 2 (two) times daily.  . midodrine (PROAMATINE) 5 MG tablet Take 1 tablet (5 mg total) by mouth 3 (three) times daily with meals.  . Multiple Vitamin (MULTIVITAMIN WITH MINERALS) TABS tablet Take 1 tablet by mouth daily. (Patient not taking: Reported on 09/16/2018)  . nutrition supplement, JUVEN, (JUVEN) PACK Take 1 packet by mouth 2 (two) times daily between meals. (Patient not taking: Reported on 09/18/2018)  . pantoprazole (PROTONIX) 40 MG tablet Take 1 tablet (40 mg total) by mouth 2 (two) times daily before a meal.  . polyethylene glycol (MIRALAX / GLYCOLAX) packet Take 17 g by mouth daily. (Patient not taking: Reported on 08/30/2018)  . senna-docusate (SENOKOT-S) 8.6-50 MG tablet Take 1 tablet by mouth 2 (two) times daily. (Patient not taking: Reported on 09/08/2018)    FAMILY HISTORY:   Her family history includes Allergies in her mother; Asthma in her brother; Cancer in  her paternal grandmother; Diabetes in her maternal grandmother; Emphysema in her paternal grandfather; Hypertension in her father, maternal grandfather, maternal grandmother, mother, and sister.  SOCIAL HISTORY:  She  reports that she has been smoking cigarettes. She started smoking about 4 years ago. She has a 2.00 pack-year smoking history. She has never used smokeless tobacco. She reports that she does not drink alcohol or use drugs.   Magdalen Spatz, AGACNP-BC [place] Pager # [phone] After 3 pm call [phone] 09/25/2018 8:35 AM

## 2018-09-25 NOTE — Progress Notes (Signed)
Nutrition Follow-up  DOCUMENTATION CODES:   Morbid obesity  INTERVENTION:  - Continue to encourage PO intakes, with a focus on high protein options. - Will monitor for additional nutrition-related needs and potential recommendations.   NUTRITION DIAGNOSIS:   Increased nutrient needs related to wound healing as evidenced by estimated needs. -ongoing  GOAL:   Patient will meet greater than or equal to 90% of their needs -unmet  MONITOR:   PO intake, Supplement acceptance, Weight trends, Labs, I & O's, Skin  ASSESSMENT:   Patient with PMH significant for morbid obesity, chronic intertrigo complicated by bacterial infection and poor healing, Crohn's disease s/p colectomy with diverting ostomy, and pulmonary HTN. She had been hospitalized since 07/04/2018: On 08/24/2018, corneal perforation was noted and patient was transferred to [facility] where she received keratoplasty on 08/27/2018 and was transferred back to [facility] the following day.  Presents this admission with abdominal pain with possible acute pancreatitis.   Weight has been fluctuating throughout admission. Patient laying in bed with no family/visitors at bedside. She is well-known to this RD from admission October-end of November. Patient was seen by another RD on 12/27 and since that time, the only documentation concerning PO intakes are 0% of breakfast and lunch on 12/30 and 12/31.   Patient very drowsy and weak during visit. She is only able to slightly nod/shake head and the only word she says is "yeah." She denies having anything to eat or drink this AM, confirms she was able to eat a very good dinner each day at home PTA, and indicates that she ate a few bites of dinner last night. Unable to obtain any additional information from patient.   During previous hospitalization she had tried all ONS and refused all of them. RD on 12/27 spoke with her again about ONS and patient continued to refuse.    Medications reviewed; 12.5  g albumin x1 dose 1/2, 20 mg oral pepcid BID, 100 mg solu-cortef TID, 2 g IV Mg sulfate x1 run 1/2 and x1 run 1/3, daily multivitamin with minerals, 40 mg oral protonix BID, 1 packet miralax/day, 1 tablet senokot BID, 50 mEq sodium bicarb x1 dose 1/2. Labs reviewed; Na: 134 mmol/L, BUN: 25 mg/dL, creatinine: 2.46 mg/dL, Ca: 7.2 mg/dL, GFR: 28 mL/min. IVF; NS @ 100 mL/hr.       Diet Order:   Diet Order            Diet NPO time specified  Diet effective now              EDUCATION NEEDS:   Education needs have been addressed  Skin:  Skin Assessment: Skin Integrity Issues: Skin Integrity Issues:: Other (Comment) Other: non-pressure wounds- abdomen upper and lower, L breast, bilateral groin, sacrum, bilateral lower back  Last BM:  1/2  Height:   Ht Readings from Last 1 Encounters:  09/19/18 5' 2"  (1.575 m)    Weight:   Wt Readings from Last 1 Encounters:  09/24/18 (!) 174.2 kg    Ideal Body Weight:  50 kg  BMI:  Body mass index is 70.23 kg/m.  Estimated Nutritional Needs:   Kcal:  2100-2300 kcal  Protein:  105-120 grams  Fluid:  >/= 2.1 L/day     Jarome Matin, MS, RD, LDN, [facility] Inpatient Clinical Dietitian Pager # [phone] After hours/weekend pager # [phone]

## 2018-09-25 NOTE — Progress Notes (Signed)
PROGRESS NOTE    Dominique [name]  VCB:[id number] DOB: [birth year] DOA: 08/27/2018 PCP: Benito Mccreedy, MD  Brief Narrative:37 y.o.femalewithhistory of Crohn's disease status post colectomy with diverting ostomy was recently admitted for extended stay after patient was found to have hypotension abdominal wounds and possiblePOTSwas placed on midodrine and also with abdominal pain patient was placed on PPI and Pepcid had keratoplasty of the left cornea at [facility] and was transferred back to the hospital and discharge 10 days ago presents back to the ER because of increasing weakness over the last 24 hours feeling dizzy on standing and also having increasing abdominal pain mostly in the epigastric and left upper quadrant. Denies nausea vomiting or diarrhea. Has not lost consciousness or did not have any chest pain or shortness of breath.  ED Course:In the ER patient was found to be mildly hypotensive with elevated lactate and leukocytosis. CAT scan of the abdomen shows possibility of pancreatitis. Labs show leukocytosis. Creatinine has worsened from 0.7-1.1. Patient was given fluid bolus and pain relief medication admitted for further management of hypotension abdominal pain likely from pancreatitis. 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  Patient's hemoglobin has been stable so is unlikely she has hemorrhage.  However she is responding to IV hydration and IV antibiotics.  She was treated with fluid resuscitation and Levophed.  Monitor closely.  An A-line was placed for better monitoring of her blood pressure.  Patient has chronic hypotension and she is on midodrine and Florinef at home.  Throughout all these hypotensive episodes patient's mentation has remained the same.  She is awake alert and oriented.  #2 AKI on CKD stage II slow improvement with IV hydration.  Will order renal ultrasound since there has not been much improvement in her renal functions.  #3 multiple chronic wounds and skin damage along the intertriginous areas follow wound care recommendations.  #4 left corneal perforation status post keratoplasty continue prednisone drops surgery done at [facility] patient to follow-up with them in 2 weeks.  #5 history of Crohn's disease and ileostomy stable at this time.  #6 morbid obesity  #7 status post mild episode of pancreatitis by CT findings of inflammation of the tail of the pancreas.  Normal lipase normal triglycerides.       Nutrition Problem: Increased nutrient needs Etiology: wound healing     Signs/Symptoms: estimated needs    Interventions: MVI, Refer to RD note for recommendations  Estimated body mass index is 70.23 kg/m as calculated from the following:   Height as of this encounter: 5' 2"  (1.575 m).   Weight as of this encounter: 174.2 kg.  DVT prophylaxis: scd Code Status:full Family Communication:none Disposition  Plan: unknown   Consultants: pccm    Procedures right ij 1/2 Antimicrobials:vanc cefepime flagyl  Subjective: She was resting in bed when I walked into the room I called her name she opened her eyes and said she is  fine she appears in no acute distress   Objective: Vitals:   09/25/18 1000 09/25/18 1100 09/25/18 1200 09/25/18 1300  BP:      Pulse: 89 97 99 (!) 118  Resp: 13 17 16 18   Temp:   97.6 F (36.4 C)   TempSrc:   Oral   SpO2: 98% 97% 97% 96%  Weight:      Height:        Intake/Output Summary (Last 24 hours) at 09/25/2018 1326 Last data filed at 09/25/2018 2956 Gross per 24 hour  Intake 1408.89 ml  Output 455 ml  Net 953.89 ml   Filed Weights   09/22/18 0527 09/23/18 0507 09/24/18 0451  Weight: (!) 165.1 kg (!) 165.1 kg (!) 174.2 kg    Examination:  General exam: Appears calm and comfortable  Respiratory system: Clear to auscultation. Respiratory effort normal. Cardiovascular system: S1 & S2 heard, RRR. No JVD, murmurs, rubs, gallops or clicks. No pedal edema. Gastrointestinal system: Abdomen is nondistended, soft and tender right upper quadrant right lower quadrant no organomegaly or masses felt. Normal bowel sounds heard. Central nervous system: Alert and oriented. No focal neurological deficits. Extremities: Trace edema. Skin: No rashes, lesions or ulcers Psychiatry: Judgement and insight appear normal. Mood & affect appropriate.     Data Reviewed: I have personally reviewed following labs and imaging studies  CBC: Recent Labs  Lab 09/21/18 0828 09/21/18 2110 09/24/18 0017 09/24/18 0649 09/24/18 1215 09/25/18 0000  WBC 13.1* 15.8* 38.4* 35.9* 41.0* 41.9*  NEUTROABS 10.0*  --  33.6* 32.7* 38.0* 39.2*  HGB 11.1* 11.0* 10.9* 9.7* 10.4* 10.7*  HCT 31.6* 33.2* 33.6* 29.4* 30.8* 31.4*  MCV 78.4* 80.2 82.2 83.1 81.9 80.3  PLT 265 292 186 200 196 213   Basic Metabolic Panel: Recent Labs  Lab 09/20/18 0837 09/21/18 0730 09/21/18 2110 09/23/18 2052 09/24/18 0301 09/24/18 0934 09/24/18 1215 09/25/18 0837  NA 138 138 136 131* 132*  --  133* 134*  K 3.5 4.3 3.3* 4.3 3.7  --  4.3 4.0  CL 113* 112* 110 108 111  --  111 111  CO2 17* 18* 19* 16* 14*  --  16* 16*    GLUCOSE 84 86 117* 133* 109*  --  113* 121*  BUN 8 8 9 16 16   --  19 25*  CREATININE 1.13* 1.13* 1.20* 2.34* 2.30*  --  2.33* 2.46*  CALCIUM 7.7* 7.8* 7.7* 7.4* 6.7*  --  7.0* 7.2*  MG 1.5* 1.8 1.8 1.7  --  1.5*  --   --    GFR: Estimated Creatinine Clearance: 48.8 mL/min (A) (by C-G formula based on SCr of 2.46 mg/dL (H)). Liver Function Tests: Recent Labs  Lab 09/20/18 0837 09/24/18 0301  AST 25 19  ALT 15 13  ALKPHOS 99 92  BILITOT 0.9 0.9  PROT 4.8* 3.8*  ALBUMIN 1.6* 1.2*   Recent Labs  Lab 09/20/18 0837 09/24/18 0301  LIPASE 33 19   No results for input(s): AMMONIA in the last 168 hours. Coagulation Profile: No results for input(s): INR, PROTIME in the last 168 hours. Cardiac Enzymes: No results for input(s): CKTOTAL, CKMB, CKMBINDEX, TROPONINI in the last 168 hours. BNP (last 3 results) No results for input(s): PROBNP in  the last 8760 hours. HbA1C: No results for input(s): HGBA1C in the last 72 hours. CBG: Recent Labs  Lab 09/24/18 2159  GLUCAP 89   Lipid Profile: Recent Labs    09/24/18 0934  TRIG 59   Thyroid Function Tests: No results for input(s): TSH, T4TOTAL, FREET4, T3FREE, THYROIDAB in the last 72 hours. Anemia Panel: No results for input(s): VITAMINB12, FOLATE, FERRITIN, TIBC, IRON, RETICCTPCT in the last 72 hours. Sepsis Labs: Recent Labs  Lab 09/24/18 0017 09/24/18 0455 09/24/18 0934 09/24/18 1501  LATICACIDVEN 5.1* 3.1* 2.2* 1.4    Recent Results (from the past 240 hour(s))  Culture, Urine     Status: None   Collection Time: 09/24/18  1:29 AM  Result Value Ref Range Status   Specimen Description   Final    URINE, CATHETERIZED Performed at [facility] [address], [place], [place] [address]    Special Requests   Final    NONE Performed at [facility], [place] [address] [place], [place] [address]    Culture   Final    NO GROWTH Performed at [facility], [place] [address],  [place], [place] [address]    Report Status 09/25/2018 FINAL  Final  Aerobic/Anaerobic Culture (surgical/deep wound)     Status: None (Preliminary result)   Collection Time: 09/24/18  1:29 AM  Result Value Ref Range Status   Specimen Description   Final    WOUND ABDOMEN Performed at [facility] [address], [place], [place] [address]    Special Requests   Final    NONE Performed at [facility], [place] [address] [place], [place] [address]    Gram Stain   Final    ABUNDANT WBC PRESENT, PREDOMINANTLY PMN RARE GRAM POSITIVE COCCI Performed at [facility], [place] [address], [place],  [address]    Culture FEW UNIDENTIFIED ORGANISM  Final   Report Status PENDING  Incomplete         Radiology Studies: Ct Abdomen Pelvis Wo Contrast  Result Date: 09/24/2018 CLINICAL DATA:  Acute onset of generalized weakness and abdominal pain. Question of recent pancreatitis. Leukocytosis. Acute renal failure. EXAM: CT ABDOMEN AND PELVIS WITHOUT CONTRAST TECHNIQUE: Multidetector CT imaging of the abdomen and pelvis was performed following the standard protocol without IV contrast. COMPARISON:  CT of the abdomen and pelvis from 08/30/2018 FINDINGS: Lower chest: Trace right-sided pleural fluid is noted, with associated atelectasis. The visualized portions of the mediastinum are unremarkable. Hepatobiliary: The liver is grossly unremarkable in appearance. The gallbladder is not well characterized due to surrounding ascites. The common bile duct is normal in caliber. New ascites about the liver is higher in attenuation than the liver itself, raising suspicion for hemorrhage or complex fluid. Pancreas: The previously noted cystic focus at the pancreatic tail is less well seen on the current study. On correlation with lab results, the patient's lipase is within normal limits, without definite evidence for pancreatitis. Spleen: The spleen is unremarkable in appearance.  Adrenals/Urinary Tract: The adrenal glands are unremarkable in appearance. The kidneys are within normal limits. There is no evidence of hydronephrosis. No renal or ureteral stones are seen. No perinephric stranding is appreciated. Stomach/Bowel: The patient is status post resection of much of the colon. Relatively high attenuation fluid is noted tracking about the bowel. Visualized small bowel loops are largely decompressed and unremarkable in appearance. A right lower quadrant ileostomy is grossly unremarkable in appearance, with herniation of  the distal small bowel into the hernia. There is no evidence of bowel obstruction. The patient's Hartmann's pouch is grossly unremarkable in appearance. Vascular/Lymphatic: The abdominal aorta is unremarkable in appearance. The inferior vena cava is grossly unremarkable. No retroperitoneal lymphadenopathy is seen. No pelvic sidewall lymphadenopathy is identified. Reproductive: The bladder is decompressed, with a Foley catheter in place. The uterus is grossly unremarkable in appearance. No suspicious adnexal masses are seen. Other: No additional soft tissue abnormalities are seen. Musculoskeletal: No acute osseous abnormalities are identified. The visualized musculature is unremarkable in appearance. IMPRESSION: 1. New ascites about the liver is higher in attenuation than the liver itself, raising suspicion for hemorrhage or complex fluid. The patient's hematocrit has not significantly changed recently; given the patient's symptoms and severe leukocytosis, would correlate clinically for evidence of peritonitis. 2. Previously noted cystic focus at the pancreatic tail is less well seen on the current study. On correlation with lab results, the patient's lipase is within normal limits, without definite evidence for pancreatitis. 3. Right lower quadrant ileostomy is grossly unremarkable in appearance, with herniation of the distal small bowel into the hernia. No evidence of bowel  obstruction. 4. Trace right-sided pleural fluid, with associated atelectasis. These results were called by telephone at the time of interpretation on 09/24/2018 at 4:39 am to [facility] at [facility] ICU, who verbally acknowledged these results. Electronically Signed   By: Garald Balding M.D.   On: 09/24/2018 04:43   Dg Chest Port 1 View  Result Date: 09/24/2018 CLINICAL DATA:  Central line placement EXAM: PORTABLE CHEST 1 VIEW COMPARISON:  Chest radiograph from earlier today. FINDINGS: Right internal jugular central venous catheter terminates at the cavoatrial junction. Stable cardiomediastinal silhouette with mild cardiomegaly. No pneumothorax. No pleural effusion. Low lung volumes. No overt pulmonary edema. No acute consolidative airspace disease. IMPRESSION: 1. Right internal jugular central venous catheter terminates at the cavoatrial junction. No pneumothorax. 2. Low lung volumes. Stable mild cardiomegaly without overt pulmonary edema. Electronically Signed   By: Ilona Sorrel M.D.   On: 09/24/2018 14:09   Dg Chest Port 1 View  Result Date: 09/24/2018 CLINICAL DATA:  Dyspnea and leukocytosis EXAM: PORTABLE CHEST 1 VIEW COMPARISON:  08/30/2018 chest radiograph. FINDINGS: Stable leftward displacement of the trachea at the thoracic inlet. Stable cardiomediastinal silhouette with mild cardiomegaly. No pneumothorax. No pleural effusion. Lungs appear clear, with no acute consolidative airspace disease and no pulmonary edema. IMPRESSION: Stable mild cardiomegaly without pulmonary edema. No active pulmonary disease. Stable leftward displacement of the trachea at the thoracic inlet suggesting enlarged right thyroid lobe. Electronically Signed   By: Ilona Sorrel M.D.   On: 09/24/2018 02:01        Scheduled Meds: . Chlorhexidine Gluconate Cloth  6 each Topical Daily  . DULoxetine  20 mg Oral Daily  . famotidine  20 mg Oral BID  . ferrous gluconate  324 mg Oral BID WC  . [START ON 09/26/2018] heparin injection  (subcutaneous)  5,000 Units Subcutaneous Q8H  . hydrocortisone sod succinate (SOLU-CORTEF) inj  100 mg Intravenous Q8H  .  HYDROmorphone (DILAUDID) injection  0.5 mg Intravenous Once  . mouth rinse  15 mL Mouth Rinse BID  . midodrine  10 mg Oral TID WC  . multivitamin with minerals  1 tablet Oral Daily  . pantoprazole  40 mg Oral BID AC  . polyethylene glycol  17 g Oral Daily  . prednisoLONE acetate  1 drop Left Eye Q6H  . senna-docusate  1 tablet Oral  BID  . sodium chloride flush  10-40 mL Intracatheter Q12H   Continuous Infusions: . sodium chloride 100 mL/hr at 09/24/18 1900  . sodium chloride    . ceFEPime (MAXIPIME) IV Stopped (09/25/18 1107)  . metronidazole Stopped (09/25/18 7209)  . [START ON 09/26/2018] vancomycin       LOS: 9 days     Georgette Shell, MD [facility]  If 7PM-7AM, please contact night-coverage [email or web address] Password [email or web address] 09/25/2018, 1:26 PM

## 2018-09-25 NOTE — Consult Note (Signed)
Dominique [name] Admit Date: 09/09/2018 09/25/2018 Dominique [name] Requesting Physician:  Zigmund Daniel MD  Reason for Consult:  AKI HPI:  38 year old female admitted 5/24 with abdominal pain.  Prior to that she been admitted for almost 2 months.  Work-up included findings of new ascites in the right upper quadrant and question of pancreatitis.  PMH Incudes:  Morbid obesity, BMI greater than 65  Crohn's disease with history of colectomy and end ostomy  Pulmonary hypertension  OSA not on CPAP  Recent corneal perforation  Chronic hypotension with question of postural orthostasis, on Midrin  Patient's baseline creatinine is around 1.0 during treatment of patient's abdominal pain she received Toradol from 12/26 to 12/31.  She had IV contrast exposure on 12/24 with a CT of the abdomen.  That imaging was negative for acute obstructive renal issues.  She has had persistent hypotension requiring vasopressors.  These have weaned off quite a bit.  Urine analysis without hematuria during this admission.  She has had progressive renal failure with a creatinine of 2.68, potassium 4.1, bicarbonate 15 with anion gap of 8 (she has a chronic metabolic acidosis, normal anion gap, presumed from her abdominal issues)   Patient without complaint at the current time.  She does not have ACE inhibitor as her ARB is on her list.  She has no significant edema.  Currently afebrile.   Creat (mg/dL)  Date Value  12/29/2014 0.52   Creatinine, Ser (mg/dL)  Date Value  09/25/2018 2.68 (H)  09/25/2018 2.46 (H)  09/24/2018 2.33 (H)  09/24/2018 2.30 (H)  09/23/2018 2.34 (H)  09/21/2018 1.20 (H)  09/21/2018 1.13 (H)  09/20/2018 1.13 (H)  09/17/2018 0.91  09/16/2018 0.94  ] I/Os: I/O last 3 completed shifts: In: 2849 [P.O.:210; I.V.:1212.3; IV Piggyback:1426.7] Out: 865 [Urine:465; Stool:400]   ROS NSAIDS: given toradol scheduled 12/26-12/31 IV Contrast CT A/P 12/24 TMP/SMX no exposure Hypotension  present, req pressors Balance of 12 systems is negative w/ exceptions as above  PMH  Past Medical History:  Diagnosis Date  . Acute acalculous cholecystitis 11/16/2014  . Anal fistula   . Anemia   . Anxiety   . Asthma   . CHF (congestive heart failure) ([place])   . Complication of anesthesia    states she had a problem staying awake after her c-section, was transferred from [facility] to [facility]  . Coronary artery disease   . Diverticulitis of colon 11/17/2014  . Headache    used to have migraines  . Hepatic steatosis 11/17/2014  . Herpes   . Hypertension   . Morbid obesity ([place]) 03/22/2009   Qualifier: Diagnosis of  By: Ronnald Ramp CMA, [place]    . Neuropathy   . OSA (obstructive sleep apnea) 05/02/2014  . Seasonal allergies 06/13/2014  . Sickle cell anemia ([place])    She states she " has the trait" (07/20/15)  . Sleep apnea    uses Bipap  . Thyromegaly 05/02/2014   PSH  Past Surgical History:  Procedure Laterality Date  . c section 2011  2011  . COLONOSCOPY WITH PROPOFOL N/A 12/27/2015   Procedure: COLONOSCOPY WITH PROPOFOL;  Surgeon: Wonda Horner, MD;  Location: [facility] ENDOSCOPY;  Service: Endoscopy;  Laterality: N/A;  . COLOSTOMY N/A 06/11/2015   Procedure:  CREATION OF COLOSTOMY;  Surgeon: Georganna Skeans, MD;  Location: [place];  Service: General;  Laterality: N/A;  . ESOPHAGOGASTRODUODENOSCOPY N/A 07/19/2015   Procedure: ESOPHAGOGASTRODUODENOSCOPY (EGD);  Surgeon: Clarene Essex, MD;  Location: [facility] ENDOSCOPY;  Service: Endoscopy;  Laterality: N/A;  .  FLEXIBLE SIGMOIDOSCOPY N/A 06/05/2015   Procedure: FLEXIBLE SIGMOIDOSCOPY;  Surgeon: Ronald Lobo, MD;  Location: [facility] ENDOSCOPY;  Service: Endoscopy;  Laterality: N/A;  . LAPAROTOMY N/A 06/09/2015   Procedure: EXPLORATORY LAPAROTOMY, DRAINAGE OF INTRAABDOMINAL ABSCESS, PARTIAL COLON RESECTION,  APPLICATION OF WOUND VAC;  Surgeon: Georganna Skeans, MD;  Location: [place];  Service: General;  Laterality: N/A;  . LAPAROTOMY N/A 06/11/2015   Procedure:  RE-EXPLORATION OF ABDOMEN;  Surgeon: Georganna Skeans, MD;  Location: [place];  Service: General;  Laterality: N/A;   FH  Family History  Problem Relation Age of Onset  . Hypertension Mother   . Allergies Mother   . Hypertension Father   . Cancer Paternal Grandmother        lung  . Hypertension Sister   . Asthma Brother   . Diabetes Maternal Grandmother   . Hypertension Maternal Grandmother   . Hypertension Maternal Grandfather   . Emphysema Paternal Grandfather    SH  reports that she has been smoking cigarettes. She started smoking about 4 years ago. She has a 2.00 pack-year smoking history. She has never used smokeless tobacco. She reports that she does not drink alcohol or use drugs. Allergies  Allergies  Allergen Reactions  . Other Shortness Of Breath and Swelling    Tree nuts  . Penicillins Other (See Comments)    Unknown childhood allergy Has patient had a PCN reaction causing immediate rash, facial/tongue/throat swelling, SOB or lightheadedness with hypotension: Unknown Has patient had a PCN reaction causing severe rash involving mucus membranes or skin necrosis: Unknown Has patient had a PCN reaction that required hospitalization: Unknown Has patient had a PCN reaction occurring within the last 10 years: Unknown If all of the above answers are "NO", then may proceed with Cephalosporin use.    Home medications Prior to Admission medications   Medication Sig Start Date End Date Taking? Authorizing Provider  acetaminophen (TYLENOL) 500 MG tablet Take 500 mg by mouth every 6 (six) hours as needed for mild pain, moderate pain or headache.   Yes [provider]  ferrous gluconate (FERGON) 324 MG tablet Take 1 tablet (324 mg total) by mouth 2 (two) times daily with a meal. 07/31/18  Yes Charlynne Cousins, MD  methocarbamol (ROBAXIN) 500 MG tablet Take 1 tablet (500 mg total) by mouth every 6 (six) hours as needed for muscle spasms. 09/07/18  Yes Eugenie Filler, MD   oxyCODONE (ROXICODONE) 15 MG immediate release tablet Take 1 tablet (15 mg total) by mouth every 4 (four) hours as needed for moderate pain. 09/07/18  Yes Eugenie Filler, MD  tiZANidine (ZANAFLEX) 4 MG tablet Take 4 mg by mouth every 8 (eight) hours as needed for muscle spasms.   Yes [provider]  albuterol (PROVENTIL) (2.5 MG/3ML) 0.083% nebulizer solution Take 3 mLs (2.5 mg total) by nebulization 2 (two) times daily as needed for wheezing. Patient not taking: Reported on 09/13/2018 06/03/18   Kayleen Memos, DO  Amino Acids-Protein Hydrolys (FEEDING SUPPLEMENT, PRO-STAT SUGAR FREE 64,) LIQD Take 30 mLs by mouth daily. Patient not taking: Reported on 09/21/2018 07/09/18   Debbe Odea, MD  bisacodyl (DULCOLAX) 5 MG EC tablet Take 1 tablet (5 mg total) by mouth daily as needed for moderate constipation. Patient not taking: Reported on 09/14/2018 07/09/18   Debbe Odea, MD  famotidine (PEPCID) 20 MG tablet Take 1 tablet (20 mg total) by mouth 2 (two) times daily. 09/07/18   Eugenie Filler, MD  feeding supplement,  ENSURE ENLIVE, (ENSURE ENLIVE) LIQD Take 237 mLs by mouth daily. Patient not taking: Reported on 09/05/2018 07/10/18   Debbe Odea, MD  metoprolol tartrate (LOPRESSOR) 25 MG tablet Take 0.5 tablets (12.5 mg total) by mouth 2 (two) times daily. 09/07/18   Eugenie Filler, MD  midodrine (PROAMATINE) 5 MG tablet Take 1 tablet (5 mg total) by mouth 3 (three) times daily with meals. 09/07/18   Eugenie Filler, MD  Multiple Vitamin (MULTIVITAMIN WITH MINERALS) TABS tablet Take 1 tablet by mouth daily. Patient not taking: Reported on 08/25/2018 07/10/18   Debbe Odea, MD  nutrition supplement, JUVEN, (JUVEN) PACK Take 1 packet by mouth 2 (two) times daily between meals. Patient not taking: Reported on 09/13/2018 07/09/18   Debbe Odea, MD  pantoprazole (PROTONIX) 40 MG tablet Take 1 tablet (40 mg total) by mouth 2 (two) times daily before a meal. 09/07/18    Eugenie Filler, MD  polyethylene glycol [facility] Floria Raveling) packet Take 17 g by mouth daily. Patient not taking: Reported on 09/16/2018 09/07/18   Eugenie Filler, MD  senna-docusate (SENOKOT-S) 8.6-50 MG tablet Take 1 tablet by mouth 2 (two) times daily. Patient not taking: Reported on 09/20/2018 09/07/18   Eugenie Filler, MD    Current Medications Scheduled Meds: . Chlorhexidine Gluconate Cloth  6 each Topical Daily  . DULoxetine  20 mg Oral Daily  . famotidine  20 mg Oral BID  . ferrous gluconate  324 mg Oral BID WC  . [START ON 09/26/2018] heparin injection (subcutaneous)  5,000 Units Subcutaneous Q8H  . hydrocortisone sod succinate (SOLU-CORTEF) inj  100 mg Intravenous Q8H  . mouth rinse  15 mL Mouth Rinse BID  . midodrine  10 mg Oral TID WC  . multivitamin with minerals  1 tablet Oral Daily  . pantoprazole  40 mg Oral BID AC  . polyethylene glycol  17 g Oral Daily  . prednisoLONE acetate  1 drop Left Eye Q6H  . senna-docusate  1 tablet Oral BID  . sodium chloride flush  10-40 mL Intracatheter Q12H   Continuous Infusions: . sodium chloride 100 mL/hr at 09/24/18 1900  . sodium chloride    . ceFEPime (MAXIPIME) IV Stopped (09/25/18 1107)  . metronidazole Stopped (09/25/18 1540)  . [START ON 09/26/2018] vancomycin     PRN Meds:.Place/Maintain arterial line **AND** sodium chloride, acetaminophen **OR** acetaminophen, HYDROmorphone (DILAUDID) injection, HYDROmorphone (DILAUDID) injection, ipratropium-albuterol, lip balm, ondansetron **OR** ondansetron (ZOFRAN) IV, oxyCODONE, sodium chloride flush, tiZANidine  CBC Recent Labs  Lab 09/24/18 0649 09/24/18 1215 09/25/18 0000 09/25/18 1421  WBC 35.9* 41.0* 41.9* 27.6*  NEUTROABS 32.7* 38.0* 39.2*  --   HGB 9.7* 10.4* 10.7* 10.6*  HCT 29.4* 30.8* 31.4* 31.0*  MCV 83.1 81.9 80.3 78.5*  PLT 200 196 213 620   Basic Metabolic Panel Recent Labs  Lab 09/21/18 0730 09/21/18 2110 09/23/18 2052 09/24/18 0301  09/24/18 1215 09/25/18 0837 09/25/18 1421  NA 138 136 131* 132* 133* 134* 135  K 4.3 3.3* 4.3 3.7 4.3 4.0 4.1  CL 112* 110 108 111 111 111 112*  CO2 18* 19* 16* 14* 16* 16* 15*  GLUCOSE 86 117* 133* 109* 113* 121* 115*  BUN 8 9 16 16 19  25* 25*  CREATININE 1.13* 1.20* 2.34* 2.30* 2.33* 2.46* 2.68*  CALCIUM 7.8* 7.7* 7.4* 6.7* 7.0* 7.2* 7.3*    Physical Exam  Blood pressure (!) 109/54, pulse 100, temperature (!) 97.5 F (36.4 C), temperature source Oral, resp. rate 14, height  5' 2"  (1.575 m), weight (!) 174.2 kg, SpO2 97 %. GEN: Morbidly obese, lying flat in bed, groggy ENT: Fair dentition EYES: Eyes closed CV: Tachycardic, normal S1 and S2  PULM: Coarse breath sounds bilaterally, diminished in bases ABD: Ostomy present, abdominal bandaging present, obese, soft SKIN: As above EXT: Trace lower extremity IMA   Assessment 38 year old female with acute renal failure in the setting of multiple medical problems likely driven by NSAID use, contrast exposure, hypotension leading to ATN  1. AKI likely ATN 2/2 NSAIDs + Hypotension + Contrast 2. Chronic NAG Metabolic Acidosis 3. Chronic Hypotension, ? POTS, remains on low dose NE 4. Pulmonary HTN 5. Morbid obesity  Plan 1. Cont to hold NSAIDs 2. Cont supportive care 3. Start NaHCOe 1300 BID 4. Hopefuly wil recover in time 5. No HD needed 6. Daily weights, Daily Renal Panel, Strict I/Os, Avoid nephrotoxins (NSAIDs, judicious IV Contrast)    Pearson Grippe MD [phone] pgr 09/25/2018, 4:31 PM

## 2018-09-26 ENCOUNTER — Inpatient Hospital Stay (HOSPITAL_COMMUNITY): Payer: Medicaid Other

## 2018-09-26 DIAGNOSIS — R7989 Other specified abnormal findings of blood chemistry: Secondary | ICD-10-CM

## 2018-09-26 DIAGNOSIS — R799 Abnormal finding of blood chemistry, unspecified: Secondary | ICD-10-CM

## 2018-09-26 LAB — CBC WITH DIFFERENTIAL/PLATELET
Abs Immature Granulocytes: 0.98 10*3/uL — ABNORMAL HIGH (ref 0.00–0.07)
Basophils Absolute: 0.1 10*3/uL (ref 0.0–0.1)
Basophils Relative: 0 %
Eosinophils Absolute: 0 10*3/uL (ref 0.0–0.5)
Eosinophils Relative: 0 %
HCT: 31.1 % — ABNORMAL LOW (ref 36.0–46.0)
HEMOGLOBIN: 10.7 g/dL — AB (ref 12.0–15.0)
Immature Granulocytes: 4 %
Lymphocytes Relative: 3 %
Lymphs Abs: 0.6 10*3/uL — ABNORMAL LOW (ref 0.7–4.0)
MCH: 27.2 pg (ref 26.0–34.0)
MCHC: 34.4 g/dL (ref 30.0–36.0)
MCV: 79.1 fL — AB (ref 80.0–100.0)
MONOS PCT: 4 %
Monocytes Absolute: 0.8 10*3/uL (ref 0.1–1.0)
Neutro Abs: 20.4 10*3/uL — ABNORMAL HIGH (ref 1.7–7.7)
Neutrophils Relative %: 89 %
Platelets: 175 10*3/uL (ref 150–400)
RBC: 3.93 MIL/uL (ref 3.87–5.11)
RDW: 16.7 % — ABNORMAL HIGH (ref 11.5–15.5)
WBC: 23 10*3/uL — ABNORMAL HIGH (ref 4.0–10.5)
nRBC: 0.1 % (ref 0.0–0.2)

## 2018-09-26 LAB — CBC
HCT: 30.1 % — ABNORMAL LOW (ref 36.0–46.0)
HCT: 30.7 % — ABNORMAL LOW (ref 36.0–46.0)
Hemoglobin: 10.2 g/dL — ABNORMAL LOW (ref 12.0–15.0)
Hemoglobin: 10.5 g/dL — ABNORMAL LOW (ref 12.0–15.0)
MCH: 27 pg (ref 26.0–34.0)
MCH: 27.3 pg (ref 26.0–34.0)
MCHC: 33.9 g/dL (ref 30.0–36.0)
MCHC: 34.2 g/dL (ref 30.0–36.0)
MCV: 79.6 fL — ABNORMAL LOW (ref 80.0–100.0)
MCV: 79.9 fL — AB (ref 80.0–100.0)
Platelets: 171 10*3/uL (ref 150–400)
Platelets: 174 10*3/uL (ref 150–400)
RBC: 3.78 MIL/uL — ABNORMAL LOW (ref 3.87–5.11)
RBC: 3.84 MIL/uL — ABNORMAL LOW (ref 3.87–5.11)
RDW: 16.5 % — ABNORMAL HIGH (ref 11.5–15.5)
RDW: 16.9 % — ABNORMAL HIGH (ref 11.5–15.5)
WBC: 28 10*3/uL — ABNORMAL HIGH (ref 4.0–10.5)
WBC: 26.7 10*3/uL — ABNORMAL HIGH (ref 4.0–10.5)
nRBC: 0.1 % (ref 0.0–0.2)
nRBC: 0.1 % (ref 0.0–0.2)

## 2018-09-26 LAB — BASIC METABOLIC PANEL
Anion gap: 8 (ref 5–15)
BUN: 29 mg/dL — ABNORMAL HIGH (ref 6–20)
CO2: 15 mmol/L — ABNORMAL LOW (ref 22–32)
Calcium: 7.4 mg/dL — ABNORMAL LOW (ref 8.9–10.3)
Chloride: 113 mmol/L — ABNORMAL HIGH (ref 98–111)
Creatinine, Ser: 2.94 mg/dL — ABNORMAL HIGH (ref 0.44–1.00)
GFR calc Af Amer: 22 mL/min — ABNORMAL LOW (ref >60)
GFR calc non Af Amer: 19 mL/min — ABNORMAL LOW (ref >60)
Glucose, Bld: 107 mg/dL — ABNORMAL HIGH (ref 70–99)
Potassium: 4.2 mmol/L (ref 3.5–5.1)
Sodium: 136 mmol/L (ref 135–145)

## 2018-09-26 LAB — BLOOD GAS, ARTERIAL
Acid-base deficit: 13.2 mmol/L — ABNORMAL HIGH (ref 0.0–2.0)
Bicarbonate: 12.3 mmol/L — ABNORMAL LOW (ref 20.0–28.0)
Drawn by: 308601
FIO2: 21
O2 Saturation: 96.9 %
PCO2 ART: 27.9 mmHg — AB (ref 32.0–48.0)
Patient temperature: 98.6
pH, Arterial: 7.266 — ABNORMAL LOW (ref 7.350–7.450)
pO2, Arterial: 95 mmHg (ref 83.0–108.0)

## 2018-09-26 LAB — DIFFERENTIAL
Abs Immature Granulocytes: 0.74 10*3/uL — ABNORMAL HIGH (ref 0.00–0.07)
Basophils Absolute: 0.1 10*3/uL (ref 0.0–0.1)
Basophils Relative: 0 %
EOS ABS: 0.1 10*3/uL (ref 0.0–0.5)
EOS PCT: 0 %
Immature Granulocytes: 3 %
Lymphocytes Relative: 3 %
Lymphs Abs: 0.8 10*3/uL (ref 0.7–4.0)
Monocytes Absolute: 1 10*3/uL (ref 0.1–1.0)
Monocytes Relative: 4 %
NEUTROS ABS: 25.3 10*3/uL — AB (ref 1.7–7.7)
Neutrophils Relative %: 90 %

## 2018-09-26 LAB — MAGNESIUM: Magnesium: 2.3 mg/dL (ref 1.7–2.4)

## 2018-09-26 LAB — GLUCOSE, CAPILLARY: GLUCOSE-CAPILLARY: 92 mg/dL (ref 70–99)

## 2018-09-26 LAB — PROCALCITONIN: Procalcitonin: 1.42 ng/mL

## 2018-09-26 LAB — LACTIC ACID, PLASMA: Lactic Acid, Venous: 1.2 mmol/L (ref 0.5–1.9)

## 2018-09-26 MED ORDER — ESCITALOPRAM OXALATE 20 MG PO TABS
10.0000 mg | ORAL_TABLET | Freq: Every day | ORAL | Status: DC
  Administered 2018-09-26: 10 mg via ORAL
  Filled 2018-09-26: qty 1

## 2018-09-26 MED ORDER — SODIUM BICARBONATE 650 MG PO TABS
1300.0000 mg | ORAL_TABLET | Freq: Three times a day (TID) | ORAL | Status: DC
  Filled 2018-09-26: qty 2

## 2018-09-26 MED ORDER — SODIUM CHLORIDE 0.9 % IV BOLUS
1000.0000 mL | Freq: Once | INTRAVENOUS | Status: AC
  Administered 2018-09-26: 1000 mL via INTRAVENOUS

## 2018-09-26 MED ORDER — HYDROMORPHONE HCL 1 MG/ML IJ SOLN
1.0000 mg | Freq: Three times a day (TID) | INTRAMUSCULAR | Status: DC | PRN
  Administered 2018-09-26 – 2018-09-29 (×5): 1 mg via INTRAVENOUS
  Filled 2018-09-26 (×7): qty 1

## 2018-09-26 MED ORDER — SODIUM CHLORIDE 0.9 % IV SOLN
2.0000 g | INTRAVENOUS | Status: DC
  Administered 2018-09-27 – 2018-09-28 (×2): 2 g via INTRAVENOUS
  Filled 2018-09-26 (×3): qty 2

## 2018-09-26 NOTE — Progress Notes (Signed)
[facility] Progress Note     Subjective: CC-  Patient resting comfortably.  Somewhat difficult to arouse.   Having trouble with dressing changes, marginal UOP  Objective: Vital signs in last 24 hours: Temp:  [97.3 F (36.3 C)-97.6 F (36.4 C)] 97.3 F (36.3 C) (01/04 0343) Pulse Rate:  [83-126] 92 (01/04 0637) Resp:  [12-20] 13 (01/04 0637) SpO2:  [96 %-99 %] 97 % (01/04 1572) Arterial Line BP: (91-120)/(54-77) 102/59 (01/04 6203) Weight:  [172.4 kg] 172.4 kg (01/04 0500) Last BM Date: 09/25/18  Intake/Output from previous day: 01/03 0701 - 01/04 0700 In: 3366.2 [P.O.:460; I.V.:2057.6; IV Piggyback:848.6] Out: 300 [Urine:150; Stool:150] Intake/Output this shift: No intake/output data recorded.  PE: Gen:  Alert, NAD, pleasant HEENT: EOM's intact, pupils equal and round Pulm:  CTAB Abd: obese, soft, cdi dressing to midline and abdominal wounds, mild TTP RUQ around ileostomy and RLQ without rebound or guarding, ileostomy pink with bilious fluid in bag Ext:  Calves soft and nontender Skin: no rashes noted, warm and dry  Lab Results:  Recent Labs    09/25/18 2316 09/26/18 0500  WBC 28.0* 26.7*  HGB 10.2* 10.5*  HCT 30.1* 30.7*  PLT 171 174   BMET Recent Labs    09/25/18 1421 09/26/18 0500  NA 135 136  K 4.1 4.2  CL 112* 113*  CO2 15* 15*  GLUCOSE 115* 107*  BUN 25* 29*  CREATININE 2.68* 2.94*  CALCIUM 7.3* 7.4*   PT/INR No results for input(s): LABPROT, INR in the last 72 hours. CMP     Component Value Date/Time   NA 136 09/26/2018 0500   K 4.2 09/26/2018 0500   CL 113 (H) 09/26/2018 0500   CO2 15 (L) 09/26/2018 0500   GLUCOSE 107 (H) 09/26/2018 0500   BUN 29 (H) 09/26/2018 0500   CREATININE 2.94 (H) 09/26/2018 0500   CREATININE 0.52 12/29/2014 1557   CALCIUM 7.4 (L) 09/26/2018 0500   PROT 4.6 (L) 09/25/2018 1421   ALBUMIN 1.5 (L) 09/25/2018 1421   AST 12 (L) 09/25/2018 1421   ALT 13 09/25/2018 1421   ALKPHOS 106 09/25/2018 1421    BILITOT 0.9 09/25/2018 1421   GFRNONAA 19 (L) 09/26/2018 0500   GFRAA 22 (L) 09/26/2018 0500   Lipase     Component Value Date/Time   LIPASE 19 09/24/2018 0301       Studies/Results: US Renal  Result Date: 09/25/2018 CLINICAL DATA:  Abnormal serum creatinine.  Renal insufficiency. EXAM: RENAL / URINARY TRACT ULTRASOUND COMPLETE COMPARISON:  CT 09/24/2018 and 09/02/2018 FINDINGS: Right Kidney: Renal measurements: 4.7 x 7.3 x 10.4 cm = volume: 186 mL . Echogenicity within normal limits. No mass or hydronephrosis visualized. Left Kidney: Not visualized. Bladder: Not visualized. Note the patient declined continued scanning after evaluating the right kidney as patient unable to tolerate exam. Incidentally noted was ascites over the right upper quadrant. IMPRESSION: Unremarkable right kidney. Left kidney and bladder not visualized as patient declined further scanning after evaluating the right kidney. Mild ascites. Electronically Signed   By: Marin Olp M.D.   On: 09/25/2018 18:13   Dg Chest Port 1 View  Result Date: 09/26/2018 CLINICAL DATA:  Initial evaluation for acute respiratory failure. EXAM: PORTABLE CHEST 1 VIEW COMPARISON:  Prior radiograph from 09/24/2018. FINDINGS: Right IJ approach central venous catheter remains in place with tip overlying the cavoatrial junction. Stable cardiomegaly. Mediastinal silhouette unchanged, and remains within normal limits. Lungs are hypoinflated with elevation of the right hemidiaphragm. Associated right  basilar bronchovascular crowding/atelectasis, similar to previous. Mild perihilar vascular congestion without overt pulmonary edema, relatively unchanged. No appreciable pleural effusion. No new focal infiltrates. No pneumothorax. Osseous structures unchanged. IMPRESSION: 1. No significant interval change in appearance of the chest. Low lung volumes with associated right basilar atelectasis/bronchovascular crowding. 2. Stable cardiomegaly with mild  perihilar vascular congestion without overt pulmonary edema. Electronically Signed   By: Jeannine Boga M.D.   On: 09/26/2018 06:09   Dg Chest Port 1 View  Result Date: 09/24/2018 CLINICAL DATA:  Central line placement EXAM: PORTABLE CHEST 1 VIEW COMPARISON:  Chest radiograph from earlier today. FINDINGS: Right internal jugular central venous catheter terminates at the cavoatrial junction. Stable cardiomediastinal silhouette with mild cardiomegaly. No pneumothorax. No pleural effusion. Low lung volumes. No overt pulmonary edema. No acute consolidative airspace disease. IMPRESSION: 1. Right internal jugular central venous catheter terminates at the cavoatrial junction. No pneumothorax. 2. Low lung volumes. Stable mild cardiomegaly without overt pulmonary edema. Electronically Signed   By: Ilona Sorrel M.D.   On: 09/24/2018 14:09    Anti-infectives: Anti-infectives (From admission, onward)   Start     Dose/Rate Route Frequency Ordered Stop   09/26/18 0600  vancomycin (VANCOCIN) 1,500 mg in sodium chloride 0.9 % 500 mL IVPB     1,500 mg 250 mL/hr over 120 Minutes Intravenous Every 48 hours 09/24/18 0649     09/24/18 0530  vancomycin (VANCOCIN) 2,500 mg in sodium chloride 0.9 % 500 mL IVPB     2,500 mg 250 mL/hr over 120 Minutes Intravenous  Once 09/24/18 0529 09/24/18 1005   09/24/18 0300  metroNIDAZOLE (FLAGYL) IVPB 500 mg     500 mg 100 mL/hr over 60 Minutes Intravenous Every 8 hours 09/24/18 0246     09/24/18 0300  ceFEPIme (MAXIPIME) 2 g in sodium chloride 0.9 % 100 mL IVPB     2 g 200 mL/hr over 30 Minutes Intravenous Every 12 hours 09/24/18 0248         Assessment/Plan Morbid obesity Chronic hypotension CKD-II H/o Crohns colitis s/p exploratory laparotomy, drainage intra-abdominal abscess, partial colectomy, colostomy 05/2015 by Dr. Grandville Silos for a perforated descending colon, and s/p total colectomy/ileostomy 2018 at [facility]  Lactic acidosis - resolved Leukocytosis - WBC trending  down AKI - Cr up to 2.9, low UOP, getting fluid boluses per CCM Suspected hypovolemic shock Abdominal pain New ascites/perihepatic fluid collection/?ruptured pancreatic cyst - CT scan abdomen/pelvis 1/2 shows new ascites about the liver is higher in attenuation than the liver itself, raising suspicion for hemorrhage or complex fluid - hemoglobin stable  ID - maxipime/flagyl/vancomycin 1/2>> VTE - SCDs, lovenox FEN - IVF, NPO Foley - in place   Plan - Somewhat tender on abdominal exam. Ok for clear liquids from surgical standpoint. Plan to repeat CT scan in a couple of days for comparison.   LOS: 10 days     Rosario Adie, MD  [facility]

## 2018-09-26 NOTE — Progress Notes (Signed)
[facility] Progress Note   Subjective:   Minimally interactive presumed secondary to depression.  She does say "I'm OK"  UOP 141m yesterday documented, total Ins 3.4L (mostly IV)  Objective Vitals:   09/26/18 0530 09/26/18 0550 09/26/18 0600 09/26/18 0637  BP:      Pulse: 93 94 90 92  Resp: 13 13 12 13   Temp:      TempSrc:      SpO2: 97% 97% 97% 97%  Weight:      Height:       Physical Exam General: morbidly obese, lying flat in bed with eyes closed Heart: RRR, no rub Lungs: clear ant Abdomen: obese, ostomy with dark drainage Extremities: trace edema  Additional Objective Labs: Basic Metabolic Panel: Recent Labs  Lab 09/25/18 0837 09/25/18 1421 09/26/18 0500  NA 134* 135 136  K 4.0 4.1 4.2  CL 111 112* 113*  CO2 16* 15* 15*  GLUCOSE 121* 115* 107*  BUN 25* 25* 29*  CREATININE 2.46* 2.68* 2.94*  CALCIUM 7.2* 7.3* 7.4*   Liver Function Tests: Recent Labs  Lab 09/20/18 0837 09/24/18 0301 09/25/18 1421  AST 25 19 12*  ALT 15 13 13   ALKPHOS 99 92 106  BILITOT 0.9 0.9 0.9  PROT 4.8* 3.8* 4.6*  ALBUMIN 1.6* 1.2* 1.5*   Recent Labs  Lab 09/20/18 0837 09/24/18 0301  LIPASE 33 19   CBC: Recent Labs  Lab 09/24/18 1215 09/25/18 0000 09/25/18 1421 09/25/18 2316 09/26/18 0500  WBC 41.0* 41.9* 27.6* 28.0* 26.7*  NEUTROABS 38.0* 39.2*  --  25.3*  --   HGB 10.4* 10.7* 10.6* 10.2* 10.5*  HCT 30.8* 31.4* 31.0* 30.1* 30.7*  MCV 81.9 80.3 78.5* 79.6* 79.9*  PLT 196 213 206 171 174   Blood Culture    Component Value Date/Time   SDES BLOOD CENTRAL LINE 09/25/2018 1721   SPECREQUEST  09/25/2018 1721    BOTTLES DRAWN AEROBIC AND ANAEROBIC Blood Culture adequate volume Performed at [facility] [address], [place] [address]   CULT NO GROWTH < 12 HOURS 09/25/2018 1721   REPTSTATUS PENDING 09/25/2018 1721    Cardiac Enzymes: No results for input(s): CKTOTAL, CKMB, CKMBINDEX, TROPONINI in the last 168  hours. CBG: Recent Labs  Lab 09/24/18 2159  GLUCAP 89   Iron Studies: No results for input(s): IRON, TIBC, TRANSFERRIN, FERRITIN in the last 72 hours. @lablastinr3 @ Studies/Results: [place]  Result Date: 09/25/2018 CLINICAL DATA:  Abnormal serum creatinine.  Renal insufficiency. EXAM: RENAL / URINARY TRACT ULTRASOUND COMPLETE COMPARISON:  CT 09/24/2018 and 09/08/2018 FINDINGS: Right Kidney: Renal measurements: 4.7 x 7.3 x 10.4 cm = volume: 186 mL . Echogenicity within normal limits. No mass or hydronephrosis visualized. Left Kidney: Not visualized. Bladder: Not visualized. Note the patient declined continued scanning after evaluating the right kidney as patient unable to tolerate exam. Incidentally noted was ascites over the right upper quadrant. IMPRESSION: Unremarkable right kidney. Left kidney and bladder not visualized as patient declined further scanning after evaluating the right kidney. Mild ascites. Electronically Signed   By: DMarin OlpM.D.   On: 09/25/2018 18:13   Dg Chest Port 1 View  Result Date: 09/26/2018 CLINICAL DATA:  Initial evaluation for acute respiratory failure. EXAM: PORTABLE CHEST 1 VIEW COMPARISON:  Prior radiograph from 09/24/2018. FINDINGS: Right IJ approach central venous catheter remains in place with tip overlying the cavoatrial junction. Stable cardiomegaly. Mediastinal silhouette unchanged, and remains within normal limits. Lungs are hypoinflated with elevation  of the right hemidiaphragm. Associated right basilar bronchovascular crowding/atelectasis, similar to previous. Mild perihilar vascular congestion without overt pulmonary edema, relatively unchanged. No appreciable pleural effusion. No new focal infiltrates. No pneumothorax. Osseous structures unchanged. IMPRESSION: 1. No significant interval change in appearance of the chest. Low lung volumes with associated right basilar atelectasis/bronchovascular crowding. 2. Stable cardiomegaly with mild perihilar  vascular congestion without overt pulmonary edema. Electronically Signed   By: Jeannine Boga M.D.   On: 09/26/2018 06:09   Dg Chest Port 1 View  Result Date: 09/24/2018 CLINICAL DATA:  Central line placement EXAM: PORTABLE CHEST 1 VIEW COMPARISON:  Chest radiograph from earlier today. FINDINGS: Right internal jugular central venous catheter terminates at the cavoatrial junction. Stable cardiomediastinal silhouette with mild cardiomegaly. No pneumothorax. No pleural effusion. Low lung volumes. No overt pulmonary edema. No acute consolidative airspace disease. IMPRESSION: 1. Right internal jugular central venous catheter terminates at the cavoatrial junction. No pneumothorax. 2. Low lung volumes. Stable mild cardiomegaly without overt pulmonary edema. Electronically Signed   By: Ilona Sorrel M.D.   On: 09/24/2018 14:09   Medications: . sodium chloride Stopped (09/26/18 0551)  . sodium chloride    . ceFEPime (MAXIPIME) IV 2 g (09/26/18 0957)  . metronidazole 100 mL/hr at 09/26/18 0600  . vancomycin Stopped (09/26/18 0751)   . Chlorhexidine Gluconate Cloth  6 each Topical Daily  . DULoxetine  20 mg Oral Daily  . famotidine  20 mg Oral BID  . ferrous gluconate  324 mg Oral BID WC  . heparin injection (subcutaneous)  5,000 Units Subcutaneous Q8H  . hydrocortisone sod succinate (SOLU-CORTEF) inj  50 mg Intravenous Q8H  . mouth rinse  15 mL Mouth Rinse BID  . midodrine  10 mg Oral TID WC  . multivitamin with minerals  1 tablet Oral Daily  . pantoprazole  40 mg Oral BID AC  . polyethylene glycol  17 g Oral Daily  . prednisoLONE acetate  1 drop Left Eye Q6H  . senna-docusate  1 tablet Oral BID  . sodium bicarbonate  1,300 mg Oral BID  . sodium chloride flush  10-40 mL Intracatheter Q12H    Assessment 38 year old female with acute renal failure in the setting of multiple medical problems likely driven by NSAID use, contrast exposure, hypotension leading to ATN  1. AKI likely ATN 2/2  NSAIDs + Hypotension + Contrast 2. Chronic NAG Metabolic Acidosis 3. Chronic Hypotension, ? POTS, off low dose NE on midodrine 4. Pulmonary HTN 5. Morbid obesity  Plan 1. Cont to hold NSAIDs 2. Cont supportive care 3. D/C MIVF today given net +3L.  Daily assessment.  4. Increase NaHCOe 1300 TID 5. Hopefuly wil recover in time 6. No HD needed 7. Daily weights, Daily Renal Panel, Strict I/Os, Avoid nephrotoxins (NSAIDs, judicious IV Contrast)   Jannifer Hick MD 09/26/2018, 11:09 AM  [facility] Pager: [phone]

## 2018-09-26 NOTE — Progress Notes (Signed)
PROGRESS NOTE    Dominique [name]  ACZ:[id number] DOB: 04/28/1981 DOA: 09/14/2018 PCP: Benito Mccreedy, MD  Brief Narrative:37 y.o.femalewithhistory of Crohn's disease status post colectomy with diverting ostomy was recently admitted for extended stay after patient was found to have hypotension abdominal wounds and possiblePOTSwas placed on midodrine and also with abdominal pain patient was placed on PPI and Pepcid had keratoplasty of the left cornea at [facility] and was transferred back to the hospital and discharge 10 days ago presents back to the ER because of increasing weakness over the last 24 hours feeling dizzy on standing and also having increasing abdominal pain mostly in the epigastric and left upper quadrant. Denies nausea vomiting or diarrhea. Has not lost consciousness or did not have any chest pain or shortness of breath.  ED Course:In the ER patient was found to be mildly hypotensive with elevated lactate and leukocytosis. CAT scan of the abdomen shows possibility of pancreatitis. Labs show leukocytosis. Creatinine has worsened from 0.7-1.1. Patient was given fluid bolus and pain relief medication admitted for further management of hypotension abdominal pain likely from pancreatitis. 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  Patient's hemoglobin has been stable so is unlikely she has hemorrhage.  However she is responding to IV hydration and IV antibiotics.  She was treated with fluid resuscitation and Levophed.  Monitor closely.  An A-line was placed for better monitoring of her blood pressure.  Patient has chronic hypotension and she is on midodrine and Florinef at home.  Throughout all these hypotensive episodes patient's mentation has remained the same.  She is awake alert and oriented. Will DC vancomycin especially since her creatinine is climbing up and her cultures have remained negative.  MRSA PCR was also negative. Her white count has improved significantly. -Repeat CT Monday without contrast  #2 AKI on CKD stage II-appreciate renal input.  AKI secondary to contrast plus NSAIDs plus hypotension.    Ultrasound of the kidney right kidney unremarkable patient refused to do an ultrasound of the left kidney secondary to pain.  #3 multiple chronic wounds and skin damage along the intertriginous areas follow wound care recommendations.  #4 left corneal perforation status post keratoplasty continue prednisone drops surgery done at [facility] patient to follow-up with them in 2 weeks.  #5 history of Crohn's disease and ileostomy stable at this time.  #6 morbid obesity  #7 status post mild episode of pancreatitis by CT findings of inflammation of the tail of the pancreas.  Normal lipase normal triglycerides.       Nutrition Problem: Increased nutrient needs Etiology: wound healing     Signs/Symptoms: estimated needs    Interventions: MVI, Refer to  RD note for recommendations  Estimated body mass index is 69.5 kg/m as calculated from the following:   Height as of this encounter: 5' 2"  (1.575 m).   Weight as of this encounter: 172.4 kg.  DVT  prophylaxis: SCD Code Status: Full code Family Communication: No family available Disposition Plan: Pending clinical improvement Consultants:  PCCM, GI, general surgery  Procedures: None Antimicrobials: Vanco cefepime and Flagyl Subjective: Patient resting in bed little hard to wake her up today staff thinks that she is depressed does not talk much even when she can  Objective: Vitals:   09/26/18 0530 09/26/18 0550 09/26/18 0600 09/26/18 0637  BP:      Pulse: 93 94 90 92  Resp: 13 13 12 13   Temp:      TempSrc:      SpO2: 97% 97% 97% 97%  Weight:      Height:        Intake/Output Summary (Last 24 hours) at 09/26/2018 1243 Last data filed at 09/26/2018 0600 Gross per 24 hour  Intake 3126.17 ml  Output 300 ml  Net 2826.17 ml   Filed Weights   09/23/18 0507 09/24/18 0451 09/26/18 0500  Weight: (!) 165.1 kg (!) 174.2 kg (!) 172.4 kg    Examination:  General exam: Appears calm and comfortable  Respiratory system: Clear to auscultation. Respiratory effort normal. Cardiovascular system: S1 & S2 heard, RRR. No JVD, murmurs, rubs, gallops or clicks. No pedal edema. Gastrointestinal system: Abdomen is nondistended, soft and nontender. No organomegaly or masses felt. Normal bowel sounds heard.  Ileostomy in place with green liquid stool right upper quadrant and right lower quadrant tenderness. Extremities: Symmetric 5 x 5 power. Skin: No rashes, lesions or ulcers    Data Reviewed: I have personally reviewed following labs and imaging studies  CBC: Recent Labs  Lab 09/24/18 0017 09/24/18 0649 09/24/18 1215 09/25/18 0000 09/25/18 1421 09/25/18 2316 09/26/18 0500  WBC 38.4* 35.9* 41.0* 41.9* 27.6* 28.0* 26.7*  NEUTROABS 33.6* 32.7* 38.0* 39.2*  --  25.3*  --   HGB 10.9* 9.7* 10.4* 10.7* 10.6* 10.2* 10.5*  HCT 33.6* 29.4* 30.8* 31.4* 31.0* 30.1* 30.7*  MCV 82.2 83.1 81.9 80.3 78.5* 79.6* 79.9*  PLT 186 200 196 213 206 171 245   Basic Metabolic Panel: Recent Labs  Lab  09/21/18 0730 09/21/18 2110 09/23/18 2052 09/24/18 0301 09/24/18 0934 09/24/18 1215 09/25/18 0837 09/25/18 1421 09/25/18 2100 09/26/18 0500  NA 138 136 131* 132*  --  133* 134* 135  --  136  K 4.3 3.3* 4.3 3.7  --  4.3 4.0 4.1  --  4.2  CL 112* 110 108 111  --  111 111 112*  --  113*  CO2 18* 19* 16* 14*  --  16* 16* 15*  --  15*  GLUCOSE 86 117* 133* 109*  --  113* 121* 115*  --  107*  BUN 8 9 16 16   --  19 25* 25*  --  29*  CREATININE 1.13* 1.20* 2.34* 2.30*  --  2.33* 2.46* 2.68*  --  2.94*  CALCIUM 7.8* 7.7* 7.4* 6.7*  --  7.0* 7.2* 7.3*  --  7.4*  MG 1.8 1.8 1.7  --  1.5*  --   --   --  2.3  --    GFR: Estimated Creatinine Clearance: 40.5 mL/min (A) (by C-G formula based on SCr of 2.94 mg/dL (H)). Liver Function Tests: Recent Labs  Lab 09/20/18 8099 09/24/18 0301 09/25/18 1421  AST 25 19 12*  ALT 15 13 13   ALKPHOS 99 92 106  BILITOT 0.9 0.9 0.9  PROT 4.8* 3.8* 4.6*  ALBUMIN 1.6* 1.2* 1.5*   Recent Labs  Lab 09/20/18 0837 09/24/18 0301  LIPASE 33 19   No results for input(s): AMMONIA in the last 168 hours. Coagulation Profile: No results for input(s): INR, PROTIME in the last 168 hours. Cardiac Enzymes: No results for input(s): CKTOTAL, CKMB, CKMBINDEX, TROPONINI in the last 168 hours. BNP (last 3 results) No results for input(s): PROBNP in the last 8760 hours. HbA1C: No results for input(s): HGBA1C in the last 72 hours. CBG: Recent Labs  Lab 09/24/18 2159  GLUCAP 89   Lipid Profile: Recent Labs    09/24/18 0934  TRIG 59   Thyroid Function Tests: No results for input(s): TSH, T4TOTAL, FREET4, T3FREE, THYROIDAB in the last 72 hours. Anemia Panel: No results for input(s): VITAMINB12, FOLATE, FERRITIN, TIBC, IRON, RETICCTPCT in the last 72 hours. Sepsis Labs: Recent Labs  Lab 09/24/18 0455 09/24/18 0934 09/24/18 1501 09/26/18 0500 09/26/18 0935  PROCALCITON  --   --   --  1.42  --   LATICACIDVEN 3.1* 2.2* 1.4  --  1.2    Recent Results  (from the past 240 hour(s))  Culture, Urine     Status: None   Collection Time: 09/24/18  1:29 AM  Result Value Ref Range Status   Specimen Description   Final    URINE, CATHETERIZED Performed at [facility] [address], [place], [place] [address]    Special Requests   Final    NONE Performed at [facility], [place] [address] [place], [place] [address]    Culture   Final    NO GROWTH Performed at [facility], [name] [address], [place], [place] [address]    Report Status 09/25/2018 FINAL  Final  Aerobic/Anaerobic Culture (surgical/deep wound)     Status: None (Preliminary result)   Collection Time: 09/24/18  1:29 AM  Result Value Ref Range Status   Specimen Description   Final    WOUND ABDOMEN Performed at [facility] [address], [place], [place] [address]    Special Requests   Final    NONE Performed at [facility], [place] [address] [place], [place] [address]    Gram Stain   Final    ABUNDANT WBC PRESENT, PREDOMINANTLY PMN RARE GRAM POSITIVE COCCI Performed at [facility], [place] [address] [place], [place] [address]    Culture   Final    FEW CORYNEBACTERIUM SPECIES RARE GRAM NEGATIVE RODS    Report Status PENDING  Incomplete  MRSA PCR Screening     Status: None   Collection Time: 09/25/18  4:50 PM  Result Value Ref Range Status   MRSA by PCR NEGATIVE NEGATIVE Final    Comment:        The GeneXpert MRSA Assay (FDA approved for NASAL specimens only), is one component of a comprehensive MRSA colonization surveillance program. It is not intended to diagnose MRSA infection nor to guide or monitor treatment for MRSA infections. Performed at [facility], [place] [address], [place], [place] [address]   Culture, blood (routine x 2)     Status: None (Preliminary result)   Collection Time: 09/25/18  5:20 PM  Result Value Ref Range Status   Specimen  Description BLOOD CENTRAL LINE  Final   Special Requests   Final  BOTTLES DRAWN AEROBIC AND ANAEROBIC Blood Culture adequate volume Performed at [facility] [address], [place], [place] [address]    Culture NO GROWTH < 12 HOURS  Final   Report Status PENDING  Incomplete  Culture, blood (routine x 2)     Status: None (Preliminary result)   Collection Time: 09/25/18  5:21 PM  Result Value Ref Range Status   Specimen Description BLOOD CENTRAL LINE  Final   Special Requests   Final    BOTTLES DRAWN AEROBIC AND ANAEROBIC Blood Culture adequate volume Performed at [facility] [address], [place], [place] [address]    Culture NO GROWTH < 12 HOURS  Final   Report Status PENDING  Incomplete         Radiology Studies: US Renal  Result Date: 09/25/2018 CLINICAL DATA:  Abnormal serum creatinine.  Renal insufficiency. EXAM: RENAL / URINARY TRACT ULTRASOUND COMPLETE COMPARISON:  CT 09/24/2018 and 09/10/2018 FINDINGS: Right Kidney: Renal measurements: 4.7 x 7.3 x 10.4 cm = volume: 186 mL . Echogenicity within normal limits. No mass or hydronephrosis visualized. Left Kidney: Not visualized. Bladder: Not visualized. Note the patient declined continued scanning after evaluating the right kidney as patient unable to tolerate exam. Incidentally noted was ascites over the right upper quadrant. IMPRESSION: Unremarkable right kidney. Left kidney and bladder not visualized as patient declined further scanning after evaluating the right kidney. Mild ascites. Electronically Signed   By: Marin Olp M.D.   On: 09/25/2018 18:13   Dg Chest Port 1 View  Result Date: 09/26/2018 CLINICAL DATA:  Initial evaluation for acute respiratory failure. EXAM: PORTABLE CHEST 1 VIEW COMPARISON:  Prior radiograph from 09/24/2018. FINDINGS: Right IJ approach central venous catheter remains in place with tip overlying the cavoatrial junction. Stable cardiomegaly. Mediastinal  silhouette unchanged, and remains within normal limits. Lungs are hypoinflated with elevation of the right hemidiaphragm. Associated right basilar bronchovascular crowding/atelectasis, similar to previous. Mild perihilar vascular congestion without overt pulmonary edema, relatively unchanged. No appreciable pleural effusion. No new focal infiltrates. No pneumothorax. Osseous structures unchanged. IMPRESSION: 1. No significant interval change in appearance of the chest. Low lung volumes with associated right basilar atelectasis/bronchovascular crowding. 2. Stable cardiomegaly with mild perihilar vascular congestion without overt pulmonary edema. Electronically Signed   By: Jeannine Boga M.D.   On: 09/26/2018 06:09   Dg Chest Port 1 View  Result Date: 09/24/2018 CLINICAL DATA:  Central line placement EXAM: PORTABLE CHEST 1 VIEW COMPARISON:  Chest radiograph from earlier today. FINDINGS: Right internal jugular central venous catheter terminates at the cavoatrial junction. Stable cardiomediastinal silhouette with mild cardiomegaly. No pneumothorax. No pleural effusion. Low lung volumes. No overt pulmonary edema. No acute consolidative airspace disease. IMPRESSION: 1. Right internal jugular central venous catheter terminates at the cavoatrial junction. No pneumothorax. 2. Low lung volumes. Stable mild cardiomegaly without overt pulmonary edema. Electronically Signed   By: Ilona Sorrel M.D.   On: 09/24/2018 14:09        Scheduled Meds: . Chlorhexidine Gluconate Cloth  6 each Topical Daily  . DULoxetine  20 mg Oral Daily  . famotidine  20 mg Oral BID  . ferrous gluconate  324 mg Oral BID WC  . heparin injection (subcutaneous)  5,000 Units Subcutaneous Q8H  . hydrocortisone sod succinate (SOLU-CORTEF) inj  50 mg Intravenous Q8H  . mouth rinse  15 mL Mouth Rinse BID  . midodrine  10 mg Oral TID WC  . multivitamin with minerals  1 tablet  Oral Daily  . pantoprazole  40 mg Oral BID AC  . polyethylene  glycol  17 g Oral Daily  . prednisoLONE acetate  1 drop Left Eye Q6H  . senna-docusate  1 tablet Oral BID  . sodium bicarbonate  1,300 mg Oral TID  . sodium chloride flush  10-40 mL Intracatheter Q12H   Continuous Infusions: . sodium chloride Stopped (09/26/18 0551)  . sodium chloride    . [START ON 09/27/2018] ceFEPime (MAXIPIME) IV    . metronidazole 100 mL/hr at 09/26/18 0600  . vancomycin Stopped (09/26/18 0751)     LOS: 10 days     Georgette Shell, MD [facility]  If 7PM-7AM, please contact night-coverage [email or web address] Password [facility] 09/26/2018, 12:43 PM

## 2018-09-26 NOTE — Progress Notes (Signed)
Pharmacy Antibiotic Note  Dominique [name] is a 38 y.o. female admitted on 08/29/2018 with Wound infection.  Pharmacy has been consulted for Vancomycin,cefepime dosing.  Plan: Cefepime 2gm q12hr decrease to q24 hr for worsening renal function Vancomycin 2564m x1, then 15024mq48hr Daily SCr - adjust Vanc if needed, next dose due 1/6 Goal AUC = 400 - 500 for all indications, except meningitis (goal AUC > 500 and Cmin 15-20 mcg/mL)  Height: 5' 2"  (157.5 cm) Weight: (!) 380 lb (172.4 kg) IBW/kg (Calculated) : 50.1  Temp (24hrs), Avg:97.5 F (36.4 C), Min:97.3 F (36.3 C), Max:97.6 F (36.4 C)  Recent Labs  Lab 09/24/18 0017 09/24/18 0301 09/24/18 0455  09/24/18 0934 09/24/18 1215 09/24/18 1501 09/25/18 0000 09/25/18 0837 09/25/18 1421 09/25/18 2316 09/26/18 0500 09/26/18 0935  WBC 38.4*  --   --    < >  --  41.0*  --  41.9*  --  27.6* 28.0* 26.7*  --   CREATININE  --  2.30*  --   --   --  2.33*  --   --  2.46* 2.68*  --  2.94*  --   LATICACIDVEN 5.1*  --  3.1*  --  2.2*  --  1.4  --   --   --   --   --  1.2   < > = values in this interval not displayed.    Estimated Creatinine Clearance: 40.5 mL/min (A) (by C-G formula based on SCr of 2.94 mg/dL (H)).    Allergies  Allergen Reactions  . Other Shortness Of Breath and Swelling    Tree nuts  . Penicillins Other (See Comments)    Unknown childhood allergy Has patient had a PCN reaction causing immediate rash, facial/tongue/throat swelling, SOB or lightheadedness with hypotension: Unknown Has patient had a PCN reaction causing severe rash involving mucus membranes or skin necrosis: Unknown Has patient had a PCN reaction that required hospitalization: Unknown Has patient had a PCN reaction occurring within the last 10 years: Unknown If all of the above answers are "NO", then may proceed with Cephalosporin use.    Antimicrobials this admission: Cefepime 09/24/2018 >> Flagyl 09/24/2018 >> Vancomycin 09/24/2018  >>  Dose  adjustments this admission: 1/4 Cefepime 2gm q12 > q24  Microbiology results: 1/2 Surgical Wound: few Corynebacterium, rare GNR 1/2 UCx: ng-final 1/3 MRSA PCR: neg 1/3 BCx: ngtd  Thank you for allowing pharmacy to be a part of this patient's care.  GrMinda Ditto/12/2018 12:20 PM

## 2018-09-26 NOTE — Progress Notes (Signed)
Dressing change to pannus area completed,  patient crying stating I'll do it, I'll do it, and patient also stated  that staff cannot change the dressing under her breast and other skin area at this time. Patient also refused to allow staff to turn and reposition tonight.

## 2018-09-26 NOTE — Progress Notes (Signed)
Pt refuses CPAP QHS

## 2018-09-27 ENCOUNTER — Inpatient Hospital Stay (HOSPITAL_COMMUNITY): Payer: Medicaid Other

## 2018-09-27 DIAGNOSIS — R627 Adult failure to thrive: Secondary | ICD-10-CM

## 2018-09-27 LAB — CBC WITH DIFFERENTIAL/PLATELET
ABS IMMATURE GRANULOCYTES: 1.01 10*3/uL — AB (ref 0.00–0.07)
BASOS ABS: 0.1 10*3/uL (ref 0.0–0.1)
Basophils Absolute: 0.1 10*3/uL (ref 0.0–0.1)
Basophils Relative: 0 %
Basophils Relative: 1 %
Eosinophils Absolute: 0.1 10*3/uL (ref 0.0–0.5)
Eosinophils Absolute: 0 10*3/uL (ref 0.0–0.5)
Eosinophils Relative: 0 %
Eosinophils Relative: 0 %
HCT: 30.5 % — ABNORMAL LOW (ref 36.0–46.0)
HCT: 32.1 % — ABNORMAL LOW (ref 36.0–46.0)
Hemoglobin: 10.3 g/dL — ABNORMAL LOW (ref 12.0–15.0)
Hemoglobin: 11 g/dL — ABNORMAL LOW (ref 12.0–15.0)
Immature Granulocytes: 6 %
Lymphocytes Relative: 4 %
Lymphocytes Relative: 4 %
Lymphs Abs: 0.7 10*3/uL (ref 0.7–4.0)
Lymphs Abs: 0.7 10*3/uL (ref 0.7–4.0)
MCH: 27 pg (ref 26.0–34.0)
MCH: 26.6 pg (ref 26.0–34.0)
MCHC: 33.8 g/dL (ref 30.0–36.0)
MCHC: 34.3 g/dL (ref 30.0–36.0)
MCV: 80.1 fL (ref 80.0–100.0)
MCV: 77.7 fL — ABNORMAL LOW (ref 80.0–100.0)
Monocytes Absolute: 0.8 10*3/uL (ref 0.1–1.0)
Monocytes Absolute: 0.7 10*3/uL (ref 0.1–1.0)
Monocytes Relative: 4 %
Monocytes Relative: 4 %
Neutro Abs: 17.6 10*3/uL — ABNORMAL HIGH (ref 1.7–7.7)
Neutro Abs: 15.6 10*3/uL — ABNORMAL HIGH (ref 1.7–7.7)
Neutrophils Relative %: 86 %
Neutrophils Relative %: 85 %
Platelets: 127 10*3/uL — ABNORMAL LOW (ref 150–400)
Platelets: 137 10*3/uL — ABNORMAL LOW (ref 150–400)
RBC: 3.81 MIL/uL — ABNORMAL LOW (ref 3.87–5.11)
RBC: 4.13 MIL/uL (ref 3.87–5.11)
RDW: 17.1 % — ABNORMAL HIGH (ref 11.5–15.5)
RDW: 17.4 % — AB (ref 11.5–15.5)
WBC: 20.6 10*3/uL — ABNORMAL HIGH (ref 4.0–10.5)
WBC: 18.2 10*3/uL — ABNORMAL HIGH (ref 4.0–10.5)
nRBC: 0 /100 WBC
nRBC: 0.1 % (ref 0.0–0.2)
nRBC: 0.2 % (ref 0.0–0.2)

## 2018-09-27 LAB — BASIC METABOLIC PANEL
ANION GAP: 8 (ref 5–15)
BUN: 34 mg/dL — ABNORMAL HIGH (ref 6–20)
CO2: 13 mmol/L — ABNORMAL LOW (ref 22–32)
Calcium: 7.3 mg/dL — ABNORMAL LOW (ref 8.9–10.3)
Chloride: 117 mmol/L — ABNORMAL HIGH (ref 98–111)
Creatinine, Ser: 3.3 mg/dL — ABNORMAL HIGH (ref 0.44–1.00)
GFR calc Af Amer: 20 mL/min — ABNORMAL LOW (ref >60)
GFR calc non Af Amer: 17 mL/min — ABNORMAL LOW (ref >60)
Glucose, Bld: 97 mg/dL (ref 70–99)
Potassium: 4.1 mmol/L (ref 3.5–5.1)
Sodium: 138 mmol/L (ref 135–145)

## 2018-09-27 LAB — TSH: TSH: 0.796 u[IU]/mL (ref 0.350–4.500)

## 2018-09-27 LAB — AMMONIA: AMMONIA: 58 umol/L — AB (ref 9–35)

## 2018-09-27 LAB — PROCALCITONIN: Procalcitonin: 1.47 ng/mL

## 2018-09-27 MED ORDER — FUROSEMIDE 10 MG/ML IJ SOLN
20.0000 mg | Freq: Once | INTRAMUSCULAR | Status: AC
  Administered 2018-09-27: 20 mg via INTRAVENOUS
  Filled 2018-09-27: qty 2

## 2018-09-27 MED ORDER — LORAZEPAM 2 MG/ML IJ SOLN
1.0000 mg | Freq: Once | INTRAMUSCULAR | Status: AC
  Administered 2018-09-27: 1 mg via INTRAVENOUS
  Filled 2018-09-27: qty 1

## 2018-09-27 MED ORDER — FUROSEMIDE 10 MG/ML IJ SOLN
40.0000 mg | Freq: Once | INTRAMUSCULAR | Status: AC
  Administered 2018-09-27: 40 mg via INTRAVENOUS
  Filled 2018-09-27: qty 4

## 2018-09-27 NOTE — Progress Notes (Signed)
[facility] Progress Note   Subjective:    Continues not to interact very well, received Ativan this morning Received 20 and 40 mg of IV Lasix today without much urine output 150 mL's reported yesterday, has Foley Continues to be net positive Sister and family friends in the room, updated  Objective Vitals:   09/27/18 0600 09/27/18 0630 09/27/18 0800 09/27/18 1200  BP:      Pulse:      Resp: 15 16    Temp:   (!) 97.2 F (36.2 C) (!) 97.5 F (36.4 C)  TempSrc:   Oral Axillary  SpO2:      Weight:      Height:       Physical Exam General: morbidly obese, lying flat in bed with eyes closed Heart: RRR, no rub Lungs: clear ant Abdomen: obese, ostomy with dark drainage Extremities: trace edema  Additional Objective Labs: Basic Metabolic Panel: Recent Labs  Lab 09/25/18 1421 09/26/18 0500 09/27/18 0500  NA 135 136 138  K 4.1 4.2 4.1  CL 112* 113* 117*  CO2 15* 15* 13*  GLUCOSE 115* 107* 97  BUN 25* 29* 34*  CREATININE 2.68* 2.94* 3.30*  CALCIUM 7.3* 7.4* 7.3*   Liver Function Tests: Recent Labs  Lab 09/24/18 0301 09/25/18 1421  AST 19 12*  ALT 13 13  ALKPHOS 92 106  BILITOT 0.9 0.9  PROT 3.8* 4.6*  ALBUMIN 1.2* 1.5*   Recent Labs  Lab 09/24/18 0301  LIPASE 19   CBC: Recent Labs  Lab 09/25/18 2316 09/26/18 0500 09/26/18 1200 09/27/18 0000 09/27/18 0620  WBC 28.0* 26.7* 23.0* 20.6* 18.2*  NEUTROABS 25.3*  --  20.4* 17.6* 15.6*  HGB 10.2* 10.5* 10.7* 10.3* 11.0*  HCT 30.1* 30.7* 31.1* 30.5* 32.1*  MCV 79.6* 79.9* 79.1* 80.1 77.7*  PLT 171 174 175 127* 137*   Blood Culture    Component Value Date/Time   SDES BLOOD CENTRAL LINE 09/25/2018 1721   SPECREQUEST  09/25/2018 1721    BOTTLES DRAWN AEROBIC AND ANAEROBIC Blood Culture adequate volume Performed at [facility], [place]. [address] [place], [place] [address]    CULT NO GROWTH 2 DAYS 09/25/2018 1721   REPTSTATUS PENDING 09/25/2018 1721    Cardiac  Enzymes: No results for input(s): CKTOTAL, CKMB, CKMBINDEX, TROPONINI in the last 168 hours. CBG: Recent Labs  Lab 09/24/18 2159 09/26/18 2005  GLUCAP 89 92   Iron Studies: No results for input(s): IRON, TIBC, TRANSFERRIN, FERRITIN in the last 72 hours. @lablastinr3 @ Studies/Results: Ct Head Wo Contrast  Result Date: 09/27/2018 CLINICAL DATA:  Altered level of consciousness (LOC), unexplained EXAM: CT HEAD WITHOUT CONTRAST TECHNIQUE: Contiguous axial images were obtained from the base of the skull through the vertex without intravenous contrast. COMPARISON:  Head CT 12/16/2008 FINDINGS: Brain: Mild generalized cerebral and cerebellar atrophy, age advanced. No intracranial hemorrhage, mass effect, or midline shift. No hydrocephalus. The basilar cisterns are patent. No evidence of territorial infarct or acute ischemia. Diffuse chronic dural calcifications. No extra-axial or intracranial fluid collection. Vascular: No hyperdense vessel. Skull: No fracture or focal lesion. Sinuses/Orbits: Paranasal sinuses and mastoid air cells are clear. The visualized orbits are unremarkable. Other: None. IMPRESSION: 1. No acute intracranial abnormality. 2. Mild generalized atrophy, advanced for age. Electronically Signed   By: Keith Rake M.D.   On: 09/27/2018 02:17   US Renal  Result Date: 09/25/2018 CLINICAL DATA:  Abnormal serum creatinine.  Renal insufficiency. EXAM: RENAL / URINARY TRACT ULTRASOUND COMPLETE COMPARISON:  CT 09/24/2018 and 09/19/2018 FINDINGS: Right Kidney: Renal measurements: 4.7 x 7.3 x 10.4 cm = volume: 186 mL . Echogenicity within normal limits. No mass or hydronephrosis visualized. Left Kidney: Not visualized. Bladder: Not visualized. Note the patient declined continued scanning after evaluating the right kidney as patient unable to tolerate exam. Incidentally noted was ascites over the right upper quadrant. IMPRESSION: Unremarkable right kidney. Left kidney and bladder not visualized as  patient declined further scanning after evaluating the right kidney. Mild ascites. Electronically Signed   By: Marin Olp M.D.   On: 09/25/2018 18:13   Dg Chest Port 1 View  Result Date: 09/27/2018 CLINICAL DATA:  Evaluate for pulmonary edema. EXAM: PORTABLE CHEST 1 VIEW COMPARISON:  09/26/2018 FINDINGS: Right IJ catheter projects over the cavoatrial junction. Cardiac enlargement. Decrease in pulmonary edema. Unchanged asymmetric airspace opacification of the right hemidiaphragm. IMPRESSION: 1. Interval decrease in pulmonary edema. Electronically Signed   By: Kerby Moors M.D.   On: 09/27/2018 09:27   Dg Chest Port 1 View  Result Date: 09/26/2018 CLINICAL DATA:  Initial evaluation for acute respiratory failure. EXAM: PORTABLE CHEST 1 VIEW COMPARISON:  Prior radiograph from 09/24/2018. FINDINGS: Right IJ approach central venous catheter remains in place with tip overlying the cavoatrial junction. Stable cardiomegaly. Mediastinal silhouette unchanged, and remains within normal limits. Lungs are hypoinflated with elevation of the right hemidiaphragm. Associated right basilar bronchovascular crowding/atelectasis, similar to previous. Mild perihilar vascular congestion without overt pulmonary edema, relatively unchanged. No appreciable pleural effusion. No new focal infiltrates. No pneumothorax. Osseous structures unchanged. IMPRESSION: 1. No significant interval change in appearance of the chest. Low lung volumes with associated right basilar atelectasis/bronchovascular crowding. 2. Stable cardiomegaly with mild perihilar vascular congestion without overt pulmonary edema. Electronically Signed   By: Jeannine Boga M.D.   On: 09/26/2018 06:09   Medications: . sodium chloride 100 mL/hr at 09/27/18 0600  . sodium chloride    . ceFEPime (MAXIPIME) IV Stopped (09/27/18 0930)  . metronidazole Stopped (09/27/18 0700)   . Chlorhexidine Gluconate Cloth  6 each Topical Daily  . heparin injection  (subcutaneous)  5,000 Units Subcutaneous Q8H  . hydrocortisone sod succinate (SOLU-CORTEF) inj  50 mg Intravenous Q8H  . mouth rinse  15 mL Mouth Rinse BID  . multivitamin with minerals  1 tablet Oral Daily  . prednisoLONE acetate  1 drop Left Eye Q6H  . senna-docusate  1 tablet Oral BID  . sodium bicarbonate  1,300 mg Oral TID  . sodium chloride flush  10-40 mL Intracatheter Q12H    Assessment 38 year old female with acute renal failure in the setting of multiple medical problems likely driven by NSAID use, contrast exposure, hypotension leading to ATN   1. AKI likely ATN 2/2 NSAIDs + Hypotension + Contrast 2. Chronic NAG Metabolic Acidosis 3. Chronic Hypotension, ? POTS, off low dose NE on midodrine 4. Pulmonary HTN 5. Morbid obesity  Plan 1. Cont to hold NSAIDs 2. Cont supportive care 3. Hold IV fluids at the current time, I do not think they will augment urine output 4. Continue NaHCO3 1300 TID 5. Hopefuly wil recover in time 6. No HD needed; very marginal candidate if it comes to that 7. Daily weights, Daily Renal Panel, Strict I/Os, Avoid nephrotoxins (NSAIDs, judicious IV Contrast)

## 2018-09-27 NOTE — Progress Notes (Signed)
[facility] Progress Note     Subjective: CC-  Patient resting comfortably. Awake but not verbally responsive Objective: Vital signs in last 24 hours: Temp:  [97.1 F (36.2 C)-97.8 F (36.6 C)] 97.1 F (36.2 C) (01/05 0411) Pulse Rate:  [91-118] 105 (01/04 2300) Resp:  [13-17] 16 (01/05 0630) SpO2:  [96 %-98 %] 97 % (01/04 2300) Arterial Line BP: (80-127)/(59-114) 100/87 (01/05 0630) Weight:  [176 kg] 176 kg (01/05 0500) Last BM Date: 09/25/18  Intake/Output from previous day: 01/04 0701 - 01/05 0700 In: 4003.3 [P.O.:50; I.V.:3205.3; IV Piggyback:748] Out: 475 [Urine:175; Emesis/NG output:200; Stool:100] Intake/Output this shift: No intake/output data recorded.  PE: Gen:  Alert, NAD HEENT: EOM's intact, pupils equal and round Pulm:  No distress Abd: obese, soft, cdi dressing to midline and abdominal wounds Ext:  Calves soft and nontender Skin: no rashes noted, warm and dry  Lab Results:  Recent Labs    09/27/18 0000 09/27/18 0620  WBC 20.6* 18.2*  HGB 10.3* 11.0*  HCT 30.5* 32.1*  PLT 127* 137*   BMET Recent Labs    09/26/18 0500 09/27/18 0500  NA 136 138  K 4.2 4.1  CL 113* 117*  CO2 15* 13*  GLUCOSE 107* 97  BUN 29* 34*  CREATININE 2.94* 3.30*  CALCIUM 7.4* 7.3*   PT/INR No results for input(s): LABPROT, INR in the last 72 hours. CMP     Component Value Date/Time   NA 138 09/27/2018 0500   K 4.1 09/27/2018 0500   CL 117 (H) 09/27/2018 0500   CO2 13 (L) 09/27/2018 0500   GLUCOSE 97 09/27/2018 0500   BUN 34 (H) 09/27/2018 0500   CREATININE 3.30 (H) 09/27/2018 0500   CREATININE 0.52 12/29/2014 1557   CALCIUM 7.3 (L) 09/27/2018 0500   PROT 4.6 (L) 09/25/2018 1421   ALBUMIN 1.5 (L) 09/25/2018 1421   AST 12 (L) 09/25/2018 1421   ALT 13 09/25/2018 1421   ALKPHOS 106 09/25/2018 1421   BILITOT 0.9 09/25/2018 1421   GFRNONAA 17 (L) 09/27/2018 0500   GFRAA 20 (L) 09/27/2018 0500   Lipase     Component Value Date/Time   LIPASE  19 09/24/2018 0301       Studies/Results: Ct Head Wo Contrast  Result Date: 09/27/2018 CLINICAL DATA:  Altered level of consciousness (LOC), unexplained EXAM: CT HEAD WITHOUT CONTRAST TECHNIQUE: Contiguous axial images were obtained from the base of the skull through the vertex without intravenous contrast. COMPARISON:  Head CT 12/16/2008 FINDINGS: Brain: Mild generalized cerebral and cerebellar atrophy, age advanced. No intracranial hemorrhage, mass effect, or midline shift. No hydrocephalus. The basilar cisterns are patent. No evidence of territorial infarct or acute ischemia. Diffuse chronic dural calcifications. No extra-axial or intracranial fluid collection. Vascular: No hyperdense vessel. Skull: No fracture or focal lesion. Sinuses/Orbits: Paranasal sinuses and mastoid air cells are clear. The visualized orbits are unremarkable. Other: None. IMPRESSION: 1. No acute intracranial abnormality. 2. Mild generalized atrophy, advanced for age. Electronically Signed   By: Keith Rake M.D.   On: 09/27/2018 02:17   US Renal  Result Date: 09/25/2018 CLINICAL DATA:  Abnormal serum creatinine.  Renal insufficiency. EXAM: RENAL / URINARY TRACT ULTRASOUND COMPLETE COMPARISON:  CT 09/24/2018 and 09/14/2018 FINDINGS: Right Kidney: Renal measurements: 4.7 x 7.3 x 10.4 cm = volume: 186 mL . Echogenicity within normal limits. No mass or hydronephrosis visualized. Left Kidney: Not visualized. Bladder: Not visualized. Note the patient declined continued scanning after evaluating the right kidney as patient unable  to tolerate exam. Incidentally noted was ascites over the right upper quadrant. IMPRESSION: Unremarkable right kidney. Left kidney and bladder not visualized as patient declined further scanning after evaluating the right kidney. Mild ascites. Electronically Signed   By: Marin Olp M.D.   On: 09/25/2018 18:13   Dg Chest Port 1 View  Result Date: 09/26/2018 CLINICAL DATA:  Initial evaluation for acute  respiratory failure. EXAM: PORTABLE CHEST 1 VIEW COMPARISON:  Prior radiograph from 09/24/2018. FINDINGS: Right IJ approach central venous catheter remains in place with tip overlying the cavoatrial junction. Stable cardiomegaly. Mediastinal silhouette unchanged, and remains within normal limits. Lungs are hypoinflated with elevation of the right hemidiaphragm. Associated right basilar bronchovascular crowding/atelectasis, similar to previous. Mild perihilar vascular congestion without overt pulmonary edema, relatively unchanged. No appreciable pleural effusion. No new focal infiltrates. No pneumothorax. Osseous structures unchanged. IMPRESSION: 1. No significant interval change in appearance of the chest. Low lung volumes with associated right basilar atelectasis/bronchovascular crowding. 2. Stable cardiomegaly with mild perihilar vascular congestion without overt pulmonary edema. Electronically Signed   By: Jeannine Boga M.D.   On: 09/26/2018 06:09    Anti-infectives: Anti-infectives (From admission, onward)   Start     Dose/Rate Route Frequency Ordered Stop   09/27/18 1000  ceFEPIme (MAXIPIME) 2 g in sodium chloride 0.9 % 100 mL IVPB     2 g 200 mL/hr over 30 Minutes Intravenous Every 24 hours 09/26/18 1218     09/26/18 0600  vancomycin (VANCOCIN) 1,500 mg in sodium chloride 0.9 % 500 mL IVPB  Status:  Discontinued     1,500 mg 250 mL/hr over 120 Minutes Intravenous Every 48 hours 09/24/18 0649 09/26/18 1253   09/24/18 0530  vancomycin (VANCOCIN) 2,500 mg in sodium chloride 0.9 % 500 mL IVPB     2,500 mg 250 mL/hr over 120 Minutes Intravenous  Once 09/24/18 0529 09/24/18 1005   09/24/18 0300  metroNIDAZOLE (FLAGYL) IVPB 500 mg     500 mg 100 mL/hr over 60 Minutes Intravenous Every 8 hours 09/24/18 0246     09/24/18 0300  ceFEPIme (MAXIPIME) 2 g in sodium chloride 0.9 % 100 mL IVPB  Status:  Discontinued     2 g 200 mL/hr over 30 Minutes Intravenous Every 12 hours 09/24/18 0248 09/26/18  1218       Assessment/Plan Morbid obesity Chronic hypotension CKD-II H/o Crohns colitis s/p exploratory laparotomy, drainage intra-abdominal abscess, partial colectomy, colostomy 05/2015 by Dr. Grandville Silos for a perforated descending colon, and s/p total colectomy/ileostomy 2018 at [facility]  Lactic acidosis - resolved Leukocytosis - WBC trending down AKI - Cr up to 2.9, low UOP, getting fluid boluses per CCM Suspected hypovolemic shock Abdominal pain New ascites/perihepatic fluid collection/?ruptured pancreatic cyst - CT scan abdomen/pelvis 1/2 shows new ascites about the liver is higher in attenuation than the liver itself, raising suspicion for hemorrhage or complex fluid - hemoglobin stable  ID - maxipime/flagyl/vancomycin 1/2>> VTE - SCDs, lovenox FEN - IVF, NPO Foley - in place   Plan - seems to be improving slowly.  Will sign off for now.  Please call with any questions or concerns   LOS: 11 days     Rosario Adie, MD  [facility]

## 2018-09-27 NOTE — Progress Notes (Signed)
Pt refuses CPAP QHS.

## 2018-09-27 NOTE — Progress Notes (Signed)
PROGRESS NOTE    Dominique [name]  WVP:[id number] DOB: 07/25/1981 DOA: 09/13/2018 PCP: Benito Mccreedy, MD  Brief Narrative 38 y.o.femalewithhistory of Crohn's disease status post colectomy with diverting ostomy was recently admitted for extended stay after patient was found to have hypotension abdominal wounds and possiblePOTSwas placed on midodrine and also with abdominal pain patient was placed on PPI and Pepcid had keratoplasty of the left cornea at [facility] and was transferred back to the hospital and discharge 10 days ago presents back to the ER because of increasing weakness over the last 24 hours feeling dizzy on standing and also having increasing abdominal pain mostly in the epigastric and left upper quadrant. Denies nausea vomiting or diarrhea. Has not lost consciousness or did not have any chest pain or shortness of breath.  ED Course:In the ER patient was found to be mildly hypotensive with elevated lactate and leukocytosis. CAT scan of the abdomen shows possibility of pancreatitis. Labs show leukocytosis. Creatinine has worsened from 0.7-1.1. Patient was given fluid bolus and pain relief medication admitted for further management of hypotension abdominal pain likely from pancreatitis. Generalized weakness could be from hypotension and dehydration  Assessment & Plan:   Principal Problem:   Acute pancreatitis Active Problems:   Morbid obesity with body mass index of 70 and over in adult [facility])   Orthostatic hypotension   Crohn's colitis with perforation s/p left colectomy/colostomy 2016   Acute peritonitis (HCC)   OSA on CPAP   Chronic anemia   Colostomy care ([place])   ARF (acute renal failure) (HCC)   Intractable vomiting   Dizzy   Other chronic pain   FTT (failure to thrive) in adult   Intertriginous dermatitis associated with moisture   SIRS (systemic inflammatory response syndrome) (HCC)   Septic shock (HCC)   Hypovolemic shock (HCC)   Lactic acidosis   Abnormal  serum creatinine level  #1 hypotension septic shock most likely intra-abdominal with CT findings of possible peritonitis versus hemorrhage with new fluid around the liver.elevated lactate.Patient's hemoglobin has been stable so is unlikely she has hemorrhage. However she is responding to IV hydration and IV antibiotics. She was treated with fluid resuscitation and Levophed. Monitor closely. An A-line was placed for better monitoring of her blood pressure. Patient has chronic hypotension and she is on midodrine and Florinef at home.  Her mentation has been decreasing with decreased speech and not responding or following commands. Her white count has improved significantly. -Repeat CT today without contrast  -Palliative care consulted.  #2 AKI on CKD stage II-appreciate renal input.  AKI secondary to contrast plus NSAIDs plus hypotension.    Still with no significant urine output gave a dose of 20 mg IV Lasix with no output given additional 40 mg of IV Lasix.  Bladder scan showed no urine retention and the Foley catheter is flushing well.  Ultrasound of the kidney right kidney unremarkable patient refused to do an ultrasound of the left kidney secondary to pain.  #3 multiple chronic wounds and skin damage along the intertriginous areas followwound care recommendations.  #4 left corneal perforation status post keratoplasty continue prednisone drops surgery done at [facility] patient to follow-up with them in 2 weeks.  #5 history of Crohn's disease and ileostomy stable at this time.  #6 morbid obesity  #7 status post mild episode of pancreatitis by CT findings of inflammation of the tail of the pancreas. Normal lipase normal triglycerides.        Nutrition Problem: Increased nutrient needs Etiology: wound  healing     Signs/Symptoms: estimated needs    Interventions: MVI, Refer to RD note for recommendations  Estimated body mass index is 70.97 kg/m as calculated from the  following:   Height as of this encounter: 5' 2"  (1.575 m).   Weight as of this encounter: 176 kg.  DVT prophylaxis: Lovenox Family Communication: Discussed with Livia Tarr her mother her number is [phone] Mardene Celeste is a healthcare power of attorney for the patient. Disposition Plan unknown Consultants: Renal, PCCM, general surgery CODE STATUS discussed with patient's mother patient is a DO NOT RESUSCITATE  Procedures: Right IJ Antimicrobials: Cefepime and Flagyl  Subjective:  Patient is moaning and groaning does not answer questions appropriately or does not follow commands had a CT scan overnight which showed generalized atrophy and ABG showed pH of 7.2 with a normal CO2 patient refused BiPAP Objective: Vitals:   09/27/18 0530 09/27/18 0600 09/27/18 0630 09/27/18 0800  BP:      Pulse:      Resp: 15 15 16    Temp:    (!) 97.2 F (36.2 C)  TempSrc:    Oral  SpO2:      Weight:      Height:        Intake/Output Summary (Last 24 hours) at 09/27/2018 1236 Last data filed at 09/27/2018 0600 Gross per 24 hour  Intake 2946.21 ml  Output 475 ml  Net 2471.21 ml   Filed Weights   09/24/18 0451 09/26/18 0500 09/27/18 0500  Weight: (!) 174.2 kg (!) 172.4 kg (!) 176 kg    Examination:  General exam: Appears calm and comfortable  Respiratory system: Clear to auscultation. Respiratory effort normal. Cardiovascular system: S1 & S2 heard, RRR. No JVD, murmurs, rubs, gallops or clicks. No pedal edema. Gastrointestinal system: Abdomen is nondistended, soft and nontender. No organomegaly or masses felt. Normal bowel sounds heard. Central nervous system: Alert and oriented. No focal neurological deficits. Extremities: Symmetric 5 x 5 power. Skin: No rashes, lesions or ulcers Psychiatry: Judgement and insight appear normal. Mood & affect appropriate.     Data Reviewed: I have personally reviewed following labs and imaging studies  CBC: Recent Labs  Lab 09/25/18 0000   09/25/18 2316 09/26/18 0500 09/26/18 1200 09/27/18 0000 09/27/18 0620  WBC 41.9*   < > 28.0* 26.7* 23.0* 20.6* 18.2*  NEUTROABS 39.2*  --  25.3*  --  20.4* 17.6* 15.6*  HGB 10.7*   < > 10.2* 10.5* 10.7* 10.3* 11.0*  HCT 31.4*   < > 30.1* 30.7* 31.1* 30.5* 32.1*  MCV 80.3   < > 79.6* 79.9* 79.1* 80.1 77.7*  PLT 213   < > 171 174 175 127* 137*   < > = values in this interval not displayed.   Basic Metabolic Panel: Recent Labs  Lab 09/21/18 0730 09/21/18 2110 09/23/18 2052  09/24/18 0934 09/24/18 1215 09/25/18 0837 09/25/18 1421 09/25/18 2100 09/26/18 0500 09/27/18 0500  NA 138 136 131*   < >  --  133* 134* 135  --  136 138  K 4.3 3.3* 4.3   < >  --  4.3 4.0 4.1  --  4.2 4.1  CL 112* 110 108   < >  --  111 111 112*  --  113* 117*  CO2 18* 19* 16*   < >  --  16* 16* 15*  --  15* 13*  GLUCOSE 86 117* 133*   < >  --  113* 121* 115*  --  107* 97  BUN 8 9 16    < >  --  19 25* 25*  --  29* 34*  CREATININE 1.13* 1.20* 2.34*   < >  --  2.33* 2.46* 2.68*  --  2.94* 3.30*  CALCIUM 7.8* 7.7* 7.4*   < >  --  7.0* 7.2* 7.3*  --  7.4* 7.3*  MG 1.8 1.8 1.7  --  1.5*  --   --   --  2.3  --   --    < > = values in this interval not displayed.   GFR: Estimated Creatinine Clearance: 36.7 mL/min (A) (by C-G formula based on SCr of 3.3 mg/dL (H)). Liver Function Tests: Recent Labs  Lab 09/24/18 0301 09/25/18 1421  AST 19 12*  ALT 13 13  ALKPHOS 92 106  BILITOT 0.9 0.9  PROT 3.8* 4.6*  ALBUMIN 1.2* 1.5*   Recent Labs  Lab 09/24/18 0301  LIPASE 19   No results for input(s): AMMONIA in the last 168 hours. Coagulation Profile: No results for input(s): INR, PROTIME in the last 168 hours. Cardiac Enzymes: No results for input(s): CKTOTAL, CKMB, CKMBINDEX, TROPONINI in the last 168 hours. BNP (last 3 results) No results for input(s): PROBNP in the last 8760 hours. HbA1C: No results for input(s): HGBA1C in the last 72 hours. CBG: Recent Labs  Lab 09/24/18 2159 09/26/18 2005   GLUCAP 89 92   Lipid Profile: No results for input(s): CHOL, HDL, LDLCALC, TRIG, CHOLHDL, LDLDIRECT in the last 72 hours. Thyroid Function Tests: No results for input(s): TSH, T4TOTAL, FREET4, T3FREE, THYROIDAB in the last 72 hours. Anemia Panel: No results for input(s): VITAMINB12, FOLATE, FERRITIN, TIBC, IRON, RETICCTPCT in the last 72 hours. Sepsis Labs: Recent Labs  Lab 09/24/18 0455 09/24/18 0934 09/24/18 1501 09/26/18 0500 09/26/18 0935 09/27/18 0500  PROCALCITON  --   --   --  1.42  --  1.47  LATICACIDVEN 3.1* 2.2* 1.4  --  1.2  --     Recent Results (from the past 240 hour(s))  Culture, Urine     Status: None   Collection Time: 09/24/18  1:29 AM  Result Value Ref Range Status   Specimen Description   Final    URINE, CATHETERIZED Performed at [facility], [place] [address] [place], [place] [address]    Special Requests   Final    NONE Performed at [facility], [place] [address] [place], [place] [address]    Culture   Final    NO GROWTH Performed at [facility], [place] [address] [place], [place] [address]    Report Status 09/25/2018 FINAL  Final  Aerobic/Anaerobic Culture (surgical/deep wound)     Status: None (Preliminary result)   Collection Time: 09/24/18  1:29 AM  Result Value Ref Range Status   Specimen Description   Final    WOUND ABDOMEN Performed at [place] [address], [place], [place] [address]    Special Requests   Final    NONE Performed at [facility], [place] [address] [place], [place] [address]    Gram Stain   Final    ABUNDANT WBC PRESENT, PREDOMINANTLY PMN RARE GRAM POSITIVE COCCI Performed at [facility], [place] [address], [place], [place] [address]    Culture   Final    FEW CORYNEBACTERIUM SPECIES RARE KLEBSIELLA PNEUMONIAE MIXED ANAEROBIC FLORA PRESENT.  CALL LAB IF FURTHER IID REQUIRED.    Report Status  PENDING  Incomplete  MRSA PCR  Screening     Status: None   Collection Time: 09/25/18  4:50 PM  Result Value Ref Range Status   MRSA by PCR NEGATIVE NEGATIVE Final    Comment:        The GeneXpert MRSA Assay (FDA approved for NASAL specimens only), is one component of a comprehensive MRSA colonization surveillance program. It is not intended to diagnose MRSA infection nor to guide or monitor treatment for MRSA infections. Performed at [facility], [place] [address] [place], [place] [address]   Culture, blood (routine x 2)     Status: None (Preliminary result)   Collection Time: 09/25/18  5:20 PM  Result Value Ref Range Status   Specimen Description BLOOD CENTRAL LINE  Final   Special Requests   Final    BOTTLES DRAWN AEROBIC AND ANAEROBIC Blood Culture adequate volume Performed at [place] [address], [place], [place] [address]    Culture NO GROWTH 2 DAYS  Final   Report Status PENDING  Incomplete  Culture, blood (routine x 2)     Status: None (Preliminary result)   Collection Time: 09/25/18  5:21 PM  Result Value Ref Range Status   Specimen Description BLOOD CENTRAL LINE  Final   Special Requests   Final    BOTTLES DRAWN AEROBIC AND ANAEROBIC Blood Culture adequate volume Performed at [place] [address], [place], [place] [address]    Culture NO GROWTH 2 DAYS  Final   Report Status PENDING  Incomplete         Radiology Studies: Ct Head Wo Contrast  Result Date: 09/27/2018 CLINICAL DATA:  Altered level of consciousness (LOC), unexplained EXAM: CT HEAD WITHOUT CONTRAST TECHNIQUE: Contiguous axial images were obtained from the base of the skull through the vertex without intravenous contrast. COMPARISON:  Head CT 12/16/2008 FINDINGS: Brain: Mild generalized cerebral and cerebellar atrophy, age advanced. No intracranial hemorrhage, mass effect, or midline shift. No hydrocephalus. The basilar cisterns are patent. No evidence of  territorial infarct or acute ischemia. Diffuse chronic dural calcifications. No extra-axial or intracranial fluid collection. Vascular: No hyperdense vessel. Skull: No fracture or focal lesion. Sinuses/Orbits: Paranasal sinuses and mastoid air cells are clear. The visualized orbits are unremarkable. Other: None. IMPRESSION: 1. No acute intracranial abnormality. 2. Mild generalized atrophy, advanced for age. Electronically Signed   By: Keith Rake M.D.   On: 09/27/2018 02:17   US Renal  Result Date: 09/25/2018 CLINICAL DATA:  Abnormal serum creatinine.  Renal insufficiency. EXAM: RENAL / URINARY TRACT ULTRASOUND COMPLETE COMPARISON:  CT 09/24/2018 and 09/20/2018 FINDINGS: Right Kidney: Renal measurements: 4.7 x 7.3 x 10.4 cm = volume: 186 mL . Echogenicity within normal limits. No mass or hydronephrosis visualized. Left Kidney: Not visualized. Bladder: Not visualized. Note the patient declined continued scanning after evaluating the right kidney as patient unable to tolerate exam. Incidentally noted was ascites over the right upper quadrant. IMPRESSION: Unremarkable right kidney. Left kidney and bladder not visualized as patient declined further scanning after evaluating the right kidney. Mild ascites. Electronically Signed   By: Marin Olp M.D.   On: 09/25/2018 18:13   Dg Chest Port 1 View  Result Date: 09/27/2018 CLINICAL DATA:  Evaluate for pulmonary edema. EXAM: PORTABLE CHEST 1 VIEW COMPARISON:  09/26/2018 FINDINGS: Right IJ catheter projects over the cavoatrial junction. Cardiac enlargement. Decrease in pulmonary edema. Unchanged asymmetric airspace opacification of the right hemidiaphragm. IMPRESSION: 1. Interval decrease in  pulmonary edema. Electronically Signed   By: Kerby Moors M.D.   On: 09/27/2018 09:27   Dg Chest Port 1 View  Result Date: 09/26/2018 CLINICAL DATA:  Initial evaluation for acute respiratory failure. EXAM: PORTABLE CHEST 1 VIEW COMPARISON:  Prior radiograph from  09/24/2018. FINDINGS: Right IJ approach central venous catheter remains in place with tip overlying the cavoatrial junction. Stable cardiomegaly. Mediastinal silhouette unchanged, and remains within normal limits. Lungs are hypoinflated with elevation of the right hemidiaphragm. Associated right basilar bronchovascular crowding/atelectasis, similar to previous. Mild perihilar vascular congestion without overt pulmonary edema, relatively unchanged. No appreciable pleural effusion. No new focal infiltrates. No pneumothorax. Osseous structures unchanged. IMPRESSION: 1. No significant interval change in appearance of the chest. Low lung volumes with associated right basilar atelectasis/bronchovascular crowding. 2. Stable cardiomegaly with mild perihilar vascular congestion without overt pulmonary edema. Electronically Signed   By: Jeannine Boga M.D.   On: 09/26/2018 06:09        Scheduled Meds: . Chlorhexidine Gluconate Cloth  6 each Topical Daily  . DULoxetine  20 mg Oral Daily  . escitalopram  10 mg Oral Daily  . famotidine  20 mg Oral BID  . ferrous gluconate  324 mg Oral BID WC  . heparin injection (subcutaneous)  5,000 Units Subcutaneous Q8H  . hydrocortisone sod succinate (SOLU-CORTEF) inj  50 mg Intravenous Q8H  . mouth rinse  15 mL Mouth Rinse BID  . midodrine  10 mg Oral TID WC  . multivitamin with minerals  1 tablet Oral Daily  . pantoprazole  40 mg Oral BID AC  . polyethylene glycol  17 g Oral Daily  . prednisoLONE acetate  1 drop Left Eye Q6H  . senna-docusate  1 tablet Oral BID  . sodium bicarbonate  1,300 mg Oral TID  . sodium chloride flush  10-40 mL Intracatheter Q12H   Continuous Infusions: . sodium chloride 100 mL/hr at 09/27/18 0600  . sodium chloride    . ceFEPime (MAXIPIME) IV Stopped (09/27/18 0930)  . metronidazole Stopped (09/27/18 0700)     LOS: 11 days     Georgette Shell, MD [facility]  If 7PM-7AM, please contact  night-coverage [email or web address] Password [facility] 09/27/2018, 12:36 PM

## 2018-09-27 NOTE — Progress Notes (Signed)
Patient ID: Dominique [name], female   DOB: July 11, 1981, 38 y.o.   MRN: [id number]   Thank you for consulting the Palliative Medicine Team at [facility] to meet your patient's and family's needs.   The reason that you asked [place] to see your patient is to help establish [place]  Patient does not have medical decisional capacity at this time.  Await arrival of mother  and a callback to set time for [place] meeting.  Discussed with bedside RN  No charge  Wadie Lessen NP  Palliative Medicine Team Team Phone # [phone] Pager [phone]

## 2018-09-28 DIAGNOSIS — Z515 Encounter for palliative care: Secondary | ICD-10-CM

## 2018-09-28 LAB — CBC WITH DIFFERENTIAL/PLATELET
Abs Immature Granulocytes: 0.92 10*3/uL — ABNORMAL HIGH (ref 0.00–0.07)
Abs Immature Granulocytes: 1.34 10*3/uL — ABNORMAL HIGH (ref 0.00–0.07)
BASOS PCT: 1 %
Basophils Absolute: 0.1 10*3/uL (ref 0.0–0.1)
Basophils Absolute: 0.1 10*3/uL (ref 0.0–0.1)
Basophils Relative: 1 %
EOS ABS: 0 10*3/uL (ref 0.0–0.5)
Eosinophils Absolute: 0 10*3/uL (ref 0.0–0.5)
Eosinophils Relative: 0 %
Eosinophils Relative: 0 %
HCT: 33.6 % — ABNORMAL LOW (ref 36.0–46.0)
HEMATOCRIT: 31.1 % — AB (ref 36.0–46.0)
HEMOGLOBIN: 11.1 g/dL — AB (ref 12.0–15.0)
Hemoglobin: 10.5 g/dL — ABNORMAL LOW (ref 12.0–15.0)
IMMATURE GRANULOCYTES: 7 %
Immature Granulocytes: 9 %
Lymphocytes Relative: 6 %
Lymphocytes Relative: 5 %
Lymphs Abs: 0.7 10*3/uL (ref 0.7–4.0)
Lymphs Abs: 0.8 10*3/uL (ref 0.7–4.0)
MCH: 26.7 pg (ref 26.0–34.0)
MCH: 26.6 pg (ref 26.0–34.0)
MCHC: 33.8 g/dL (ref 30.0–36.0)
MCHC: 33 g/dL (ref 30.0–36.0)
MCV: 79.1 fL — ABNORMAL LOW (ref 80.0–100.0)
MCV: 80.4 fL (ref 80.0–100.0)
Monocytes Absolute: 0.3 10*3/uL (ref 0.1–1.0)
Monocytes Absolute: 0.4 10*3/uL (ref 0.1–1.0)
Monocytes Relative: 3 %
Monocytes Relative: 3 %
Neutro Abs: 10.9 10*3/uL — ABNORMAL HIGH (ref 1.7–7.7)
Neutro Abs: 12.3 10*3/uL — ABNORMAL HIGH (ref 1.7–7.7)
Neutrophils Relative %: 83 %
Neutrophils Relative %: 82 %
Platelets: 137 10*3/uL — ABNORMAL LOW (ref 150–400)
Platelets: 97 10*3/uL — ABNORMAL LOW (ref 150–400)
RBC: 3.93 MIL/uL (ref 3.87–5.11)
RBC: 4.18 MIL/uL (ref 3.87–5.11)
RDW: 17.6 % — ABNORMAL HIGH (ref 11.5–15.5)
RDW: 17.7 % — ABNORMAL HIGH (ref 11.5–15.5)
WBC: 12.9 10*3/uL — ABNORMAL HIGH (ref 4.0–10.5)
WBC: 14.9 10*3/uL — ABNORMAL HIGH (ref 4.0–10.5)
nRBC: 0.3 % — ABNORMAL HIGH (ref 0.0–0.2)
nRBC: 0.6 % — ABNORMAL HIGH (ref 0.0–0.2)

## 2018-09-28 LAB — BASIC METABOLIC PANEL
Anion gap: 8 (ref 5–15)
BUN: 43 mg/dL — AB (ref 6–20)
CO2: 11 mmol/L — ABNORMAL LOW (ref 22–32)
Calcium: 7.5 mg/dL — ABNORMAL LOW (ref 8.9–10.3)
Chloride: 119 mmol/L — ABNORMAL HIGH (ref 98–111)
Creatinine, Ser: 4.08 mg/dL — ABNORMAL HIGH (ref 0.44–1.00)
GFR calc Af Amer: 15 mL/min — ABNORMAL LOW (ref >60)
GFR calc non Af Amer: 13 mL/min — ABNORMAL LOW (ref >60)
Glucose, Bld: 98 mg/dL (ref 70–99)
Potassium: 3.9 mmol/L (ref 3.5–5.1)
SODIUM: 138 mmol/L (ref 135–145)

## 2018-09-28 LAB — AEROBIC/ANAEROBIC CULTURE W GRAM STAIN (SURGICAL/DEEP WOUND)

## 2018-09-28 LAB — MAGNESIUM: MAGNESIUM: 2.3 mg/dL (ref 1.7–2.4)

## 2018-09-28 LAB — AEROBIC/ANAEROBIC CULTURE (SURGICAL/DEEP WOUND)

## 2018-09-28 MED ORDER — METOPROLOL TARTRATE 5 MG/5ML IV SOLN
5.0000 mg | Freq: Once | INTRAVENOUS | Status: AC
  Administered 2018-09-28: 5 mg via INTRAVENOUS
  Filled 2018-09-28: qty 5

## 2018-09-28 NOTE — Progress Notes (Signed)
PROGRESS NOTE    Dominique [name]  PPI:[id number] DOB: 07-26-1981 DOA: 09/16/2018 PCP: Benito Mccreedy, MD    Brief Narrative:38 y.o.femalewithhistory of Crohn's disease status post colectomy with diverting ostomy was recently admitted for extended stay after patient was found to have hypotension abdominal wounds and possiblePOTSwas placed on midodrine and also with abdominal pain patient was placed on PPI and Pepcid had keratoplasty of the left cornea at [facility] and was transferred back to the hospital and discharge 10 days ago presents back to the ER because of increasing weakness over the last 24 hours feeling dizzy on standing and also having increasing abdominal pain mostly in the epigastric and left upper quadrant. Denies nausea vomiting or diarrhea. Has not lost consciousness or did not have any chest pain or shortness of breath.  ED Course:In the ER patient was found to be mildly hypotensive with elevated lactate and leukocytosis. CAT scan of the abdomen shows possibility of pancreatitis. Labs show leukocytosis. Creatinine has worsened from 0.7-1.1. Patient was given fluid bolus and pain relief medication admitted for further management of hypotension abdominal pain likely from pancreatitis. 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 However she is responding to IV hydration and IV antibiotics. She was treated with fluid resuscitation and Levophed. Monitor closely. An A-line was placed for better monitoring of her blood pressure. Patient has chronic hypotension and she is on midodrine and Florinef at home.  Her mentation has been decreasing with decreased speech and not responding or following commands. Her white count has improved significantly. -Repeat CT today without contrast shows increase in size of the right pleural effusion small to moderate severe hepatic steatosis and moderate moderate ascites right-sided ostomy without obstruction edema in the patient's pannus medially and laterally. -Palliative care consulted.  #2 AKI on CKD stage II-appreciate renal input. AKI secondary to contrast plus NSAIDs plus hypotension.   Still with no significant urine output Ultrasound of the kidney right kidney unremarkable patient refused to do an ultrasound of the left kidney secondary to pain. Unfortunately her kidney functions are worsening  #3 multiple chronic wounds and skin damage along the intertriginous areas followwound care recommendations.  #4 left corneal perforation status post keratoplasty continue prednisone drops surgery done at [facility] patient to follow-up with them in 2 weeks.       Nutrition Problem: Increased nutrient needs Etiology: wound healing     Signs/Symptoms: estimated needs    Interventions: MVI, Refer to RD note for recommendations  Estimated body mass index is 70.97 kg/m as calculated from the following:   Height as of  this encounter: 5' 2"  (1.575 m).   Weight as of this encounter: 176 kg.  DVT prophylaxis:  lovenox Code Status:dnr Family Communication: dw mother  Disposition Plan: unknown Consultants:  pccm renal surgery  Procedures right ij Antimicrobials:cefepime flagyl  Subjective:no response to verbal command responds to pain family by the bed side   Objective: Vitals:   09/28/18 0700 09/28/18 0800 09/28/18 1000 09/28/18 1138  BP: (!) 102/49 (!) 108/58 (!) 104/51   Pulse: (!) 111 (!) 113 (!) 104   Resp: 15 16 13    Temp:  97.6 F (36.4 C)  (!) 97.5 F (36.4 C)  TempSrc:  Axillary  Axillary  SpO2: 98% 99% 99%   Weight:      Height:        Intake/Output Summary (Last 24 hours) at 09/28/2018 1509 Last data filed at 09/28/2018 1124 Gross per 24 hour  Intake 585.07 ml  Output 130 ml  Net 455.07 ml   Filed Weights   09/24/18 0451 09/26/18 0500 09/27/18 0500  Weight: (!) 174.2 kg (!) 172.4 kg (!) 176 kg    Examination:  General exam: responds to pain Respiratory system: Clear to auscultation. Respiratory effort normal.decreased at the bases Cardiovascular system: S1 & S2 heard, RRR. No JVD, murmurs, rubs, gallops or clicks.  Gastrointestinal system: Abdomen is distended, soft and nontender. No organomegaly or masses felt. Normal bowel sounds heard.ileostomy in place  Extremities: Symmetric 5 x 5 power. Skin: No rashes, lesions or ulcers    Data Reviewed: I have personally reviewed following labs and imaging studies  CBC: Recent Labs  Lab 09/26/18 1200 09/27/18 0000 09/27/18 0620 09/28/18 0000 09/28/18 1200  WBC 23.0* 20.6* 18.2* 12.9* 14.9*  NEUTROABS 20.4* 17.6* 15.6* 10.9* 12.3*  HGB 10.7* 10.3* 11.0* 10.5* 11.1*  HCT 31.1* 30.5* 32.1* 31.1* 33.6*  MCV 79.1* 80.1 77.7* 79.1* 80.4  PLT 175 127* 137* 137* 97*   Basic Metabolic Panel: Recent Labs  Lab 09/21/18 2110 09/23/18 2052  09/24/18 0934  09/25/18 0837 09/25/18 1421 09/25/18 2100 09/26/18 0500 09/27/18 0500 09/28/18 0912  NA 136 131*   < >  --    < > 134* 135  --  136 138 138  K 3.3* 4.3   <  >  --    < > 4.0 4.1  --  4.2 4.1 3.9  CL 110 108   < >  --    < > 111 112*  --  113* 117* 119*  CO2 19* 16*   < >  --    < > 16* 15*  --  15* 13* 11*  GLUCOSE 117* 133*   < >  --    < > 121* 115*  --  107* 97 98  BUN 9 16   < >  --    < > 25* 25*  --  29* 34* 43*  CREATININE 1.20* 2.34*   < >  --    < > 2.46* 2.68*  --  2.94* 3.30* 4.08*  CALCIUM 7.7* 7.4*   < >  --    < > 7.2* 7.3*  --  7.4* 7.3* 7.5*  MG 1.8 1.7  --  1.5*  --   --   --  2.3  --   --   --    < > = values in this interval not displayed.   GFR: Estimated Creatinine Clearance: 29.7 mL/min (A) (by C-G formula based on SCr of 4.08 mg/dL (H)). Liver Function Tests:  Recent Labs  Lab 09/24/18 0301 09/25/18 1421  AST 19 12*  ALT 13 13  ALKPHOS 92 106  BILITOT 0.9 0.9  PROT 3.8* 4.6*  ALBUMIN 1.2* 1.5*   Recent Labs  Lab 09/24/18 0301  LIPASE 19   Recent Labs  Lab 09/27/18 1400  AMMONIA 58*   Coagulation Profile: No results for input(s): INR, PROTIME in the last 168 hours. Cardiac Enzymes: No results for input(s): CKTOTAL, CKMB, CKMBINDEX, TROPONINI in the last 168 hours. BNP (last 3 results) No results for input(s): PROBNP in the last 8760 hours. HbA1C: No results for input(s): HGBA1C in the last 72 hours. CBG: Recent Labs  Lab 09/24/18 2159 09/26/18 2005  GLUCAP 89 92   Lipid Profile: No results for input(s): CHOL, HDL, LDLCALC, TRIG, CHOLHDL, LDLDIRECT in the last 72 hours. Thyroid Function Tests: Recent Labs    09/27/18 1400  TSH 0.796   Anemia Panel: No results for input(s): VITAMINB12, FOLATE, FERRITIN, TIBC, IRON, RETICCTPCT in the last 72 hours. Sepsis Labs: Recent Labs  Lab 09/24/18 0455 09/24/18 0934 09/24/18 1501 09/26/18 0500 09/26/18 0935 09/27/18 0500  PROCALCITON  --   --   --  1.42  --  1.47  LATICACIDVEN 3.1* 2.2* 1.4  --  1.2  --     Recent Results (from the past 240 hour(s))  Culture, Urine     Status: None   Collection Time: 09/24/18  1:29 AM  Result Value Ref  Range Status   Specimen Description   Final    URINE, CATHETERIZED Performed at [facility], [place] [address] [place], [place] [address]    Special Requests   Final    NONE Performed at [facility], [place] [address] [place], [place] [address]    Culture   Final    NO GROWTH Performed at [facility], [place] [address] [place], [place] [address]    Report Status 09/25/2018 FINAL  Final  Aerobic/Anaerobic Culture (surgical/deep wound)     Status: None   Collection Time: 09/24/18  1:29 AM  Result Value Ref Range Status   Specimen Description   Final    WOUND ABDOMEN Performed at  [address], [place], [place] [address]    Special Requests   Final    NONE Performed at [facility], [place] [address] [place], [place] [address]    Gram Stain   Final    ABUNDANT WBC PRESENT, PREDOMINANTLY PMN RARE GRAM POSITIVE COCCI Performed at [facility], [place] [address], [place], [place] [address]    Culture   Final    FEW CORYNEBACTERIUM SPECIES RARE KLEBSIELLA PNEUMONIAE MIXED ANAEROBIC FLORA PRESENT.  CALL LAB IF FURTHER IID REQUIRED.    Report Status 09/28/2018 FINAL  Final   Organism ID, Bacteria KLEBSIELLA PNEUMONIAE  Final      Susceptibility   Klebsiella pneumoniae - MIC*    AMPICILLIN >=32 RESISTANT Resistant     CEFAZOLIN <=4 SENSITIVE Sensitive     CEFEPIME <=1 SENSITIVE Sensitive     CEFTAZIDIME <=1 SENSITIVE Sensitive     CEFTRIAXONE <=1 SENSITIVE Sensitive     CIPROFLOXACIN <=0.25 SENSITIVE Sensitive     GENTAMICIN <=1 SENSITIVE Sensitive     IMIPENEM <=0.25 SENSITIVE Sensitive     TRIMETH/SULFA <=20 SENSITIVE Sensitive     AMPICILLIN/SULBACTAM 4 SENSITIVE Sensitive     PIP/TAZO <=4 SENSITIVE Sensitive     Extended ESBL NEGATIVE Sensitive     *  RARE KLEBSIELLA PNEUMONIAE  MRSA PCR Screening     Status: None   Collection Time: 09/25/18  4:50 PM  Result Value Ref Range  Status   MRSA by PCR NEGATIVE NEGATIVE Final    Comment:        The GeneXpert MRSA Assay (FDA approved for NASAL specimens only), is one component of a comprehensive MRSA colonization surveillance program. It is not intended to diagnose MRSA infection nor to guide or monitor treatment for MRSA infections. Performed at [facility], [place] [address] [place], [place] [address]   Culture, blood (routine x 2)     Status: None (Preliminary result)   Collection Time: 09/25/18  5:20 PM  Result Value Ref Range Status   Specimen Description BLOOD CENTRAL LINE  Final   Special Requests   Final    BOTTLES DRAWN AEROBIC AND ANAEROBIC Blood Culture adequate volume Performed at [place] [address] [place], [place] [address]    Culture NO GROWTH 3 DAYS  Final   Report Status PENDING  Incomplete  Culture, blood (routine x 2)     Status: None (Preliminary result)   Collection Time: 09/25/18  5:21 PM  Result Value Ref Range Status   Specimen Description BLOOD CENTRAL LINE  Final   Special Requests   Final    BOTTLES DRAWN AEROBIC AND ANAEROBIC Blood Culture adequate volume Performed at [place] [address] [place], [place] [address]    Culture NO GROWTH 3 DAYS  Final   Report Status PENDING  Incomplete         Radiology Studies: Ct Abdomen Pelvis Wo Contrast  Result Date: 09/27/2018 CLINICAL DATA:  Crohn's disease with diverting ostomy. Pancreatitis. Leukocytosis and fever. Abdominal pain. EXAM: CT ABDOMEN AND PELVIS WITHOUT CONTRAST TECHNIQUE: Multidetector CT imaging of the abdomen and pelvis was performed following the standard protocol without IV contrast. COMPARISON:  09/24/2016 FINDINGS: The patient was scanned with her arms by her sides. In conjunction with her body habitus and the lack of IV contrast, this significantly reduces diagnostic sensitivity and specificity. Lower chest: Increase in size of the right  pleural effusion, currently small to moderate. New mild lingular airspace opacity on image 1/4. Mild cardiomegaly. Hepatobiliary: Severe hepatic steatosis. This causes the adjacent fluid density ascites to be higher in density than the hepatic parenchyma. Poor visualization of the gallbladder against the background of the ascites. Pancreas: Unremarkable noncontrast CT appearance. The previous pancreatic tail lesion shown on contrast enhanced exams is not conspicuous on noncontrast exam. Spleen: Unremarkable Adrenals/Urinary Tract: No hydronephrosis Foley catheter in the urinary bladder. No appreciable urinary tract calculi. Stomach/Bowel: Right-sided ostomy with peristomal hernia containing small bowel without findings of strangulation or obstruction on noncontrast images. Rectosigmoid pouch noted, blind-ending in the left lower quadrant. Vascular/Lymphatic: Unremarkable Reproductive: Unremarkable Other: Moderate ascites. Musculoskeletal: Edema in the patient's pannus medially and laterally. Nodularity along the left pedicular subcutaneous tissues measuring 1.6 by 1.1 cm on image 76/2, possibly a lymph node or injection site. Generalized muscular atrophy. IMPRESSION: 1. Increase in size of the right pleural effusion, currently small to moderate. 2. Severe hepatic steatosis. 3. Moderate ascites. This measures fluid density, although is denser than the severely fatty infiltrated liver. 4. Right-sided ostomy with peristomal hernia containing small bowel without findings of strangulation or obstruction. 5. Edema in the patient's pannus medially and laterally, low-grade panniculitis not excluded. 6. Nodularity along the left pedicular subcutaneous tissues, possibly a lymph node or injection site.  Electronically Signed   By: Van Clines M.D.   On: 09/27/2018 15:57   Ct Head Wo Contrast  Result Date: 09/27/2018 CLINICAL DATA:  Altered level of consciousness (LOC), unexplained EXAM: CT HEAD WITHOUT CONTRAST  TECHNIQUE: Contiguous axial images were obtained from the base of the skull through the vertex without intravenous contrast. COMPARISON:  Head CT 12/16/2008 FINDINGS: Brain: Mild generalized cerebral and cerebellar atrophy, age advanced. No intracranial hemorrhage, mass effect, or midline shift. No hydrocephalus. The basilar cisterns are patent. No evidence of territorial infarct or acute ischemia. Diffuse chronic dural calcifications. No extra-axial or intracranial fluid collection. Vascular: No hyperdense vessel. Skull: No fracture or focal lesion. Sinuses/Orbits: Paranasal sinuses and mastoid air cells are clear. The visualized orbits are unremarkable. Other: None. IMPRESSION: 1. No acute intracranial abnormality. 2. Mild generalized atrophy, advanced for age. Electronically Signed   By: Keith Rake M.D.   On: 09/27/2018 02:17   Dg Chest Port 1 View  Result Date: 09/27/2018 CLINICAL DATA:  Evaluate for pulmonary edema. EXAM: PORTABLE CHEST 1 VIEW COMPARISON:  09/26/2018 FINDINGS: Right IJ catheter projects over the cavoatrial junction. Cardiac enlargement. Decrease in pulmonary edema. Unchanged asymmetric airspace opacification of the right hemidiaphragm. IMPRESSION: 1. Interval decrease in pulmonary edema. Electronically Signed   By: Kerby Moors M.D.   On: 09/27/2018 09:27        Scheduled Meds: . Chlorhexidine Gluconate Cloth  6 each Topical Daily  . heparin injection (subcutaneous)  5,000 Units Subcutaneous Q8H  . hydrocortisone sod succinate (SOLU-CORTEF) inj  50 mg Intravenous Q8H  . mouth rinse  15 mL Mouth Rinse BID  . multivitamin with minerals  1 tablet Oral Daily  . prednisoLONE acetate  1 drop Left Eye Q6H  . senna-docusate  1 tablet Oral BID  . sodium bicarbonate  1,300 mg Oral TID  . sodium chloride flush  10-40 mL Intracatheter Q12H   Continuous Infusions: . sodium chloride 10 mL/hr at 09/28/18 1124  . ceFEPime (MAXIPIME) IV Stopped (09/28/18 1055)  . metronidazole  500 mg (09/28/18 1350)     LOS: 12 days     Georgette Shell, MD [facility]  If 7PM-7AM, please contact night-coverage [email or web address] Password [email or web address] 09/28/2018, 3:09 PM

## 2018-09-28 NOTE — Consult Note (Signed)
Consultation Note Date: 09/28/2018   Patient Name: Dominique [name]  DOB: 1981-03-29  MRN: [id number]  Age / Sex: 38 y.o., female  PCP: Benito Mccreedy, MD Referring Physician: Georgette Shell, MD  Reason for Consultation: Establishing goals of care and Psychosocial/spiritual support  HPI/Patient Profile: 38 y.o. female admitted on 09/03/2018 with abdominal pain, and a complex past medical history;  Crohn's disease, diverting ostomy, chronic hypotension, morbid obesity, PAH  Work-up included findings of new ascites in the right upper quadrant and question of pancreatitis.  She has had prolonged recent hospitalizations,  multiple abdominal wounds and skin breakdown.  Continued decline in spite of medical interventions, decreased renal function/ poor dialysis candidate,  persistent hypotension requiring vasopressors.    Poor prognosis; family face treatment option decisions advanced directive decisions and anticipatory care needs.   Clinical Assessment and Goals of Care:   This NP Wadie Lessen reviewed medical records, received report from team, assessed the patient and then meet at the patient's bedside along with her mother and aunt  to discuss diagnosis, prognosis, GOC, EOL wishes disposition and options.  Patient's mother understands there seriousness of the current medical situation and the long term poor prognosis, however at this time she is not in a place of de-escalation of care in any way.  She hopes for a few more days to "see how things go" and hopes she will not have to make ant  "tough decisions."  This family has had multiple close family deaths over the pat year.   Patient has an 60 year old daughter.  Concept of Hospice and Palliative Care were discussed  A  discussion was had today regarding advanced directives.  Concepts specific to code status, artifical feeding and hydration,  continued IV antibiotics and rehospitalization was had.  The difference between a aggressive medical intervention path  and a palliative comfort care path for this patient at this time was had.  Values and goals of care important to patient and family were attempted to be elicited.   Questions and concerns addressed.   Family encouraged to call with questions or concerns.    PMT will continue to support holistically.   NEXT OF KIN/mother is main Designer, television/film set, no HPOA    SUMMARY OF RECOMMENDATIONS    Code Status/Advance Care Planning:  DNR   Palliative Prophylaxis:   Aspiration, Bowel Regimen, Delirium Protocol, Frequent Pain Assessment and Oral Care  Additional Recommendations (Limitations, Scope, Preferences):  Full Scope Treatment  Psycho-social/Spiritual:   Desire for further Chaplaincy support:yes  Additional Recommendations: Education on Hospice and Grief/Bereavement Support  Prognosis:   Unable to determine  Discharge Planning: To Be Determined      Primary Diagnoses: Present on Admission: . Acute pancreatitis . Orthostatic hypotension . Crohn's colitis with perforation s/p left colectomy/colostomy 2016 . ARF (acute renal failure) ([place]) . Chronic anemia   I have reviewed the medical record, interviewed the patient and family, and examined the patient. The following aspects are pertinent.  Past Medical History:  Diagnosis Date  .  Acute acalculous cholecystitis 11/16/2014  . Anal fistula   . Anemia   . Anxiety   . Asthma   . CHF (congestive heart failure) ([place])   . Complication of anesthesia    states she had a problem staying awake after her c-section, was [facility] to [facility]  . Coronary artery disease   . Diverticulitis of colon 11/17/2014  . Headache    used to have migraines  . Hepatic steatosis 11/17/2014  . Herpes   . Hypertension   . Morbid obesity ([place]) 03/22/2009   Qualifier: Diagnosis of  By: Ronnald Ramp CMA, [place]    .  Neuropathy   . OSA (obstructive sleep apnea) 05/02/2014  . Seasonal allergies 06/13/2014  . Sickle cell anemia ([place])    She states she " has the trait" (07/20/15)  . Sleep apnea    uses Bipap  . Thyromegaly 05/02/2014   Social History   Socioeconomic History  . Marital status: Single    Spouse name: Not on file  . Number of children: Not on file  . Years of education: Not on file  . Highest education level: Not on file  Occupational History  . Not on file  Social Needs  . Financial resource strain: Not on file  . Food insecurity:    Worry: Not on file    Inability: Not on file  . Transportation needs:    Medical: Not on file    Non-medical: Not on file  Tobacco Use  . Smoking status: Current Some Day Smoker    Packs/day: 0.50    Years: 4.00    Pack years: 2.00    Types: Cigarettes    Start date: 07/24/2014    Last attempt to quit: 05/27/2015    Years since quitting: 3.3  . Smokeless tobacco: Never Used  Substance and Sexual Activity  . Alcohol use: No    Alcohol/week: 0.0 standard drinks  . Drug use: No  . Sexual activity: Not on file  Lifestyle  . Physical activity:    Days per week: Not on file    Minutes per session: Not on file  . Stress: Not on file  Relationships  . Social connections:    Talks on phone: Not on file    Gets together: Not on file    Attends religious service: Not on file    Active member of club or organization: Not on file    Attends meetings of clubs or organizations: Not on file    Relationship status: Not on file  Other Topics Concern  . Not on file  Social History Narrative  . Not on file   Family History  Problem Relation Age of Onset  . Hypertension Mother   . Allergies Mother   . Hypertension Father   . Cancer Paternal Grandmother        lung  . Hypertension Sister   . Asthma Brother   . Diabetes Maternal Grandmother   . Hypertension Maternal Grandmother   . Hypertension Maternal Grandfather   . Emphysema Paternal  Grandfather    Scheduled Meds: . Chlorhexidine Gluconate Cloth  6 each Topical Daily  . heparin injection (subcutaneous)  5,000 Units Subcutaneous Q8H  . hydrocortisone sod succinate (SOLU-CORTEF) inj  50 mg Intravenous Q8H  . mouth rinse  15 mL Mouth Rinse BID  . multivitamin with minerals  1 tablet Oral Daily  . prednisoLONE acetate  1 drop Left Eye Q6H  . senna-docusate  1 tablet Oral BID  .  sodium bicarbonate  1,300 mg Oral TID  . sodium chloride flush  10-40 mL Intracatheter Q12H   Continuous Infusions: . sodium chloride 10 mL/hr at 09/28/18 0700  . ceFEPime (MAXIPIME) IV 2 g (09/28/18 1025)  . metronidazole Stopped (09/28/18 0655)   PRN Meds:.Place/Maintain arterial line **AND** sodium chloride, acetaminophen **OR** acetaminophen, HYDROmorphone (DILAUDID) injection, HYDROmorphone (DILAUDID) injection, ipratropium-albuterol, lip balm, ondansetron **OR** ondansetron (ZOFRAN) IV, oxyCODONE, sodium chloride flush Medications Prior to Admission:  Prior to Admission medications   Medication Sig Start Date End Date Taking? Authorizing Provider  acetaminophen (TYLENOL) 500 MG tablet Take 500 mg by mouth every 6 (six) hours as needed for mild pain, moderate pain or headache.   Yes [provider]  ferrous gluconate (FERGON) 324 MG tablet Take 1 tablet (324 mg total) by mouth 2 (two) times daily with a meal. 07/31/18  Yes Charlynne Cousins, MD  methocarbamol (ROBAXIN) 500 MG tablet Take 1 tablet (500 mg total) by mouth every 6 (six) hours as needed for muscle spasms. 09/07/18  Yes Eugenie Filler, MD  oxyCODONE (ROXICODONE) 15 MG immediate release tablet Take 1 tablet (15 mg total) by mouth every 4 (four) hours as needed for moderate pain. 09/07/18  Yes Eugenie Filler, MD  tiZANidine (ZANAFLEX) 4 MG tablet Take 4 mg by mouth every 8 (eight) hours as needed for muscle spasms.   Yes [provider]  albuterol (PROVENTIL) (2.5 MG/3ML) 0.083% nebulizer solution Take 3  mLs (2.5 mg total) by nebulization 2 (two) times daily as needed for wheezing. Patient not taking: Reported on 09/04/2018 06/03/18   Kayleen Memos, DO  Amino Acids-Protein Hydrolys (FEEDING SUPPLEMENT, PRO-STAT SUGAR FREE 64,) LIQD Take 30 mLs by mouth daily. Patient not taking: Reported on 08/25/2018 07/09/18   Debbe Odea, MD  bisacodyl (DULCOLAX) 5 MG EC tablet Take 1 tablet (5 mg total) by mouth daily as needed for moderate constipation. Patient not taking: Reported on 08/28/2018 07/09/18   Debbe Odea, MD  famotidine (PEPCID) 20 MG tablet Take 1 tablet (20 mg total) by mouth 2 (two) times daily. 09/07/18   Eugenie Filler, MD  feeding supplement, ENSURE ENLIVE, (ENSURE ENLIVE) LIQD Take 237 mLs by mouth daily. Patient not taking: Reported on 09/22/2018 07/10/18   Debbe Odea, MD  metoprolol tartrate (LOPRESSOR) 25 MG tablet Take 0.5 tablets (12.5 mg total) by mouth 2 (two) times daily. 09/07/18   Eugenie Filler, MD  midodrine (PROAMATINE) 5 MG tablet Take 1 tablet (5 mg total) by mouth 3 (three) times daily with meals. 09/07/18   Eugenie Filler, MD  Multiple Vitamin (MULTIVITAMIN WITH MINERALS) TABS tablet Take 1 tablet by mouth daily. Patient not taking: Reported on 08/29/2018 07/10/18   Debbe Odea, MD  nutrition supplement, JUVEN, (JUVEN) PACK Take 1 packet by mouth 2 (two) times daily between meals. Patient not taking: Reported on 09/06/2018 07/09/18   Debbe Odea, MD  pantoprazole (PROTONIX) 40 MG tablet Take 1 tablet (40 mg total) by mouth 2 (two) times daily before a meal. 09/07/18   Eugenie Filler, MD  polyethylene glycol [facility] / Floria Raveling) packet Take 17 g by mouth daily. Patient not taking: Reported on 09/17/2018 09/07/18   Eugenie Filler, MD  senna-docusate (SENOKOT-S) 8.6-50 MG tablet Take 1 tablet by mouth 2 (two) times daily. Patient not taking: Reported on 09/19/2018 09/07/18   Eugenie Filler, MD   Allergies  Allergen Reactions  . Other  Shortness Of Breath and Swelling  Tree nuts  . Penicillins Other (See Comments)    Unknown childhood allergy Has patient had a PCN reaction causing immediate rash, facial/tongue/throat swelling, SOB or lightheadedness with hypotension: Unknown Has patient had a PCN reaction causing severe rash involving mucus membranes or skin necrosis: Unknown Has patient had a PCN reaction that required hospitalization: Unknown Has patient had a PCN reaction occurring within the last 10 years: Unknown If all of the above answers are "NO", then may proceed with Cephalosporin use.    Review of Systems  Unable to perform ROS: Acuity of condition    Physical Exam Constitutional:      Appearance: She is morbidly obese.  Cardiovascular:     Rate and Rhythm: Tachycardia present.  Skin:    General: Skin is warm and dry.     Comments: -noted skin breakdowns per EMR  Neurological:     Mental Status: She is lethargic.     Vital Signs: BP (!) 104/51   Pulse (!) 104   Temp 97.6 F (36.4 C) (Axillary)   Resp 13   Ht 5' 2"  (1.575 m)   Wt (!) 176 kg   LMP  (LMP Unknown) Comment: negative HCG quantitative 08/29/2018  SpO2 99%   BMI 70.97 kg/m  Pain Scale: CPOT POSS *See Group Information*: S-Acceptable,Sleep, easy to arouse Pain Score: Asleep   SpO2: SpO2: 99 % O2 Device:SpO2: 99 % O2 Flow Rate: .O2 Flow Rate (L/min): 3 L/min  IO: Intake/output summary:   Intake/Output Summary (Last 24 hours) at 09/28/2018 1103 Last data filed at 09/28/2018 0700 Gross per 24 hour  Intake 825.78 ml  Output 130 ml  Net 695.78 ml    LBM: Last BM Date: 09/26/18 Baseline Weight: Weight: 132 kg Most recent weight: Weight: (!) 176 kg     Palliative Assessment/Data:     Discussed with Dr Zigmund Daniel  Time In: 0830 Time Out: 0950  Time Total: 70 minutes Greater than 50%  of this time was spent counseling and coordinating care related to the above assessment and plan.  Signed by: Wadie Lessen, NP   Please  contact Palliative Medicine Team phone at [phone] for questions and concerns.  For individual provider: See Shea Evans

## 2018-09-28 NOTE — Progress Notes (Signed)
Pt refuses CPAP QHS.

## 2018-09-28 NOTE — Care Management Note (Signed)
Case Management Note  Patient Details  Name: Dominique [name] MRN: [id number] Date of Birth: 1981-06-09   Subjective/Objective:       Pancreatitis and renal failure   Discharge readiness is indicated by patient meeting Recovery Milestones, including ALL of the following: ? Hemodynamic stability  Hypotensive with POTS ? Amylase level normal or improved n/a ? Ammonia level-58/wbc-12.9 ? Pain absent or managed managed through iv medications ? No evidence of infection requiring inpatient treatment  fIV Maxipime, iv flagyl ? Renal function at baseline or acceptable for next level of care  bubn 61fcreatinine=3.30 with little uop ? Electrolyte abnormalities absent or acceptable for next level of care no ? Ambulatory[R] not at this time ? Oral hydration, medications, and diet npo see above for medications           Action/Plan: Will follow for progression of care and clinical status. Will follow for case management needs none present at this time.  Expected Discharge Date:  (unknown)               [place] Plan:  [place] Referral:  Clinical Social Work  Discharge planning Services  CM Consult  Post Acute Care Choice:    Choice offered to:  Patient  DME Arranged:    DME Agency:     HH Arranged:  RN, PT, OT HH Agency:  [facility]  Status of Service:  In process, will continue to follow  If discussed at Long Length of Stay Meetings, dates discussed:    Additional Comments:  DLeeroy Cha RN 09/28/2018, 8:30 AM

## 2018-09-28 NOTE — Progress Notes (Signed)
22m Dilaudid given to patient before wound care. Pt. did not tolerate well. Pt. became tacchycardic and continued to be moaning in pain.  MD aware. BP in 1660'Asystolic. Verbal orders from MD to give another 133mof Dilaudid. Pt. Now hypotensive with systolic in the 8000'KMD notified.

## 2018-09-28 NOTE — Progress Notes (Signed)
PT Cancellation Note  Patient Details Name: Dominique [name] MRN: [id number] DOB: [birth year]   Cancelled Treatment:    Reason Eval/Treat Not Completed: Fatigue/lethargy limiting ability to participate;Other---- pt is extremely  lethargic,  partial and brief eye opening  to voice and sternal rub, immediately back to sleep; RN present; defer PT at this time, continue efforts    [facility] 09/28/2018, 10:21 AM

## 2018-09-28 NOTE — Progress Notes (Signed)
OT Cancellation Note  Patient Details Name: Dominique [name] MRN: [id number] DOB: 1980-11-22   Cancelled Treatment:    Reason Eval/Treat Not Completed: Fatigue/lethargy limiting ability to participate  [place], Mickel Baas, OT Acute Rehabilitation Services [phone]    09/28/2018, 12:13 PM

## 2018-09-28 NOTE — Progress Notes (Signed)
[facility] Progress Note   Subjective:   UOP 100 cc yest.    CXR 1/5 - decreases IS pulm edema  Objective Vitals:   09/28/18 0700 09/28/18 0800 09/28/18 1000 09/28/18 1138  BP: (!) 102/49 (!) 108/58 (!) 104/51   Pulse: (!) 111 (!) 113 (!) 104   Resp: 15 16 13    Temp:  97.6 F (36.4 C)  (!) 97.5 F (36.4 C)  TempSrc:  Axillary  Axillary  SpO2: 98% 99% 99%   Weight:      Height:       Physical Exam General: morbidly obese, lying flat in bed with eyes closed, not responding Heart: RRR, no rub Lungs: clear ant Abdomen: obese, ostomy with dark drainage, large mid abd dressing in place Extremities: trace edema  Assessment 37 year old female with acute renal failure in the setting of multiple medical problems likely driven by NSAID use, contrast exposure, hypotension leading to ATN   1. AKI likely ATN 2/2 NSAIDs + Hypotension + Contrast 2. Chronic NAG Metabolic Acidosis 3. Chronic Hypotension, ? POTS, off low dose NE on midodrine 4. Pulmonary HTN 5. Morbid obesity  Plan 1. Cont to hold NSAIDs 2. Cont supportive care 3. Hold IV fluids as + edema on CXR and not likely to help 4. Very poor candidate for dialysis.  No new suggestions.   5. Daily weights, Daily Renal Panel, Strict I/Os, Avoid nephrotoxins (NSAIDs, judicious IV Contrast)   Additional Objective Labs: Basic Metabolic Panel: Recent Labs  Lab 09/26/18 0500 09/27/18 0500 09/28/18 0912  NA 136 138 138  K 4.2 4.1 3.9  CL 113* 117* 119*  CO2 15* 13* 11*  GLUCOSE 107* 97 98  BUN 29* 34* 43*  CREATININE 2.94* 3.30* 4.08*  CALCIUM 7.4* 7.3* 7.5*   Liver Function Tests: Recent Labs  Lab 09/24/18 0301 09/25/18 1421  AST 19 12*  ALT 13 13  ALKPHOS 92 106  BILITOT 0.9 0.9  PROT 3.8* 4.6*  ALBUMIN 1.2* 1.5*   Recent Labs  Lab 09/24/18 0301  LIPASE 19   CBC: Recent Labs  Lab 09/26/18 1200 09/27/18 0000 09/27/18 0620 09/28/18 0000 09/28/18 1200  WBC 23.0* 20.6* 18.2* 12.9*  14.9*  NEUTROABS 20.4* 17.6* 15.6* 10.9* 12.3*  HGB 10.7* 10.3* 11.0* 10.5* 11.1*  HCT 31.1* 30.5* 32.1* 31.1* 33.6*  MCV 79.1* 80.1 77.7* 79.1* 80.4  PLT 175 127* 137* 137* 97*   Blood Culture    Component Value Date/Time   SDES BLOOD CENTRAL LINE 09/25/2018 1721   SPECREQUEST  09/25/2018 1721    BOTTLES DRAWN AEROBIC AND ANAEROBIC Blood Culture adequate volume Performed at [facility], [place] [address], [place], [place] [address]    CULT NO GROWTH 3 DAYS 09/25/2018 1721   REPTSTATUS PENDING 09/25/2018 1721    Cardiac Enzymes: No results for input(s): CKTOTAL, CKMB, CKMBINDEX, TROPONINI in the last 168 hours. CBG: Recent Labs  Lab 09/24/18 2159 09/26/18 2005  GLUCAP 89 92   Iron Studies: No results for input(s): IRON, TIBC, TRANSFERRIN, FERRITIN in the last 72 hours. @lablastinr3 @ Studies/Results: Ct Abdomen Pelvis Wo Contrast  Result Date: 09/27/2018 CLINICAL DATA:  Crohn's disease with diverting ostomy. Pancreatitis. Leukocytosis and fever. Abdominal pain. EXAM: CT ABDOMEN AND PELVIS WITHOUT CONTRAST TECHNIQUE: Multidetector CT imaging of the abdomen and pelvis was performed following the standard protocol without IV contrast. COMPARISON:  09/24/2016 FINDINGS: The patient was scanned with her arms by her sides. In conjunction with her body habitus and the lack of  IV contrast, this significantly reduces diagnostic sensitivity and specificity. Lower chest: Increase in size of the right pleural effusion, currently small to moderate. New mild lingular airspace opacity on image 1/4. Mild cardiomegaly. Hepatobiliary: Severe hepatic steatosis. This causes the adjacent fluid density ascites to be higher in density than the hepatic parenchyma. Poor visualization of the gallbladder against the background of the ascites. Pancreas: Unremarkable noncontrast CT appearance. The previous pancreatic tail lesion shown on contrast enhanced exams is not conspicuous on noncontrast  exam. Spleen: Unremarkable Adrenals/Urinary Tract: No hydronephrosis Foley catheter in the urinary bladder. No appreciable urinary tract calculi. Stomach/Bowel: Right-sided ostomy with peristomal hernia containing small bowel without findings of strangulation or obstruction on noncontrast images. Rectosigmoid pouch noted, blind-ending in the left lower quadrant. Vascular/Lymphatic: Unremarkable Reproductive: Unremarkable Other: Moderate ascites. Musculoskeletal: Edema in the patient's pannus medially and laterally. Nodularity along the left pedicular subcutaneous tissues measuring 1.6 by 1.1 cm on image 76/2, possibly a lymph node or injection site. Generalized muscular atrophy. IMPRESSION: 1. Increase in size of the right pleural effusion, currently small to moderate. 2. Severe hepatic steatosis. 3. Moderate ascites. This measures fluid density, although is denser than the severely fatty infiltrated liver. 4. Right-sided ostomy with peristomal hernia containing small bowel without findings of strangulation or obstruction. 5. Edema in the patient's pannus medially and laterally, low-grade panniculitis not excluded. 6. Nodularity along the left pedicular subcutaneous tissues, possibly a lymph node or injection site. Electronically Signed   By: Van Clines M.D.   On: 09/27/2018 15:57   Ct Head Wo Contrast  Result Date: 09/27/2018 CLINICAL DATA:  Altered level of consciousness (LOC), unexplained EXAM: CT HEAD WITHOUT CONTRAST TECHNIQUE: Contiguous axial images were obtained from the base of the skull through the vertex without intravenous contrast. COMPARISON:  Head CT 12/16/2008 FINDINGS: Brain: Mild generalized cerebral and cerebellar atrophy, age advanced. No intracranial hemorrhage, mass effect, or midline shift. No hydrocephalus. The basilar cisterns are patent. No evidence of territorial infarct or acute ischemia. Diffuse chronic dural calcifications. No extra-axial or intracranial fluid collection.  Vascular: No hyperdense vessel. Skull: No fracture or focal lesion. Sinuses/Orbits: Paranasal sinuses and mastoid air cells are clear. The visualized orbits are unremarkable. Other: None. IMPRESSION: 1. No acute intracranial abnormality. 2. Mild generalized atrophy, advanced for age. Electronically Signed   By: Keith Rake M.D.   On: 09/27/2018 02:17   Dg Chest Port 1 View  Result Date: 09/27/2018 CLINICAL DATA:  Evaluate for pulmonary edema. EXAM: PORTABLE CHEST 1 VIEW COMPARISON:  09/26/2018 FINDINGS: Right IJ catheter projects over the cavoatrial junction. Cardiac enlargement. Decrease in pulmonary edema. Unchanged asymmetric airspace opacification of the right hemidiaphragm. IMPRESSION: 1. Interval decrease in pulmonary edema. Electronically Signed   By: Kerby Moors M.D.   On: 09/27/2018 09:27   Medications: . sodium chloride 10 mL/hr at 09/28/18 1124  . ceFEPime (MAXIPIME) IV Stopped (09/28/18 1055)  . metronidazole 500 mg (09/28/18 1350)   . Chlorhexidine Gluconate Cloth  6 each Topical Daily  . heparin injection (subcutaneous)  5,000 Units Subcutaneous Q8H  . hydrocortisone sod succinate (SOLU-CORTEF) inj  50 mg Intravenous Q8H  . mouth rinse  15 mL Mouth Rinse BID  . multivitamin with minerals  1 tablet Oral Daily  . prednisoLONE acetate  1 drop Left Eye Q6H  . senna-docusate  1 tablet Oral BID  . sodium bicarbonate  1,300 mg Oral TID  . sodium chloride flush  10-40 mL Intracatheter Q12H

## 2018-09-29 DIAGNOSIS — J9601 Acute respiratory failure with hypoxia: Secondary | ICD-10-CM

## 2018-09-29 DIAGNOSIS — Z515 Encounter for palliative care: Secondary | ICD-10-CM

## 2018-09-29 DIAGNOSIS — R52 Pain, unspecified: Secondary | ICD-10-CM

## 2018-09-29 LAB — CBC WITH DIFFERENTIAL/PLATELET
Abs Immature Granulocytes: 1.75 10*3/uL — ABNORMAL HIGH (ref 0.00–0.07)
Abs Immature Granulocytes: 2.06 10*3/uL — ABNORMAL HIGH (ref 0.00–0.07)
BASOS ABS: 0.2 10*3/uL — AB (ref 0.0–0.1)
Basophils Absolute: 0 10*3/uL (ref 0.0–0.1)
Basophils Relative: 1 %
Basophils Relative: 0 %
Eosinophils Absolute: 0 10*3/uL (ref 0.0–0.5)
Eosinophils Absolute: 0 10*3/uL (ref 0.0–0.5)
Eosinophils Relative: 0 %
Eosinophils Relative: 0 %
HCT: 33.9 % — ABNORMAL LOW (ref 36.0–46.0)
HCT: 33.5 % — ABNORMAL LOW (ref 36.0–46.0)
Hemoglobin: 11.3 g/dL — ABNORMAL LOW (ref 12.0–15.0)
Hemoglobin: 11.1 g/dL — ABNORMAL LOW (ref 12.0–15.0)
IMMATURE GRANULOCYTES: 10 %
Immature Granulocytes: 11 %
Lymphocytes Relative: 6 %
Lymphocytes Relative: 5 %
Lymphs Abs: 1 10*3/uL (ref 0.7–4.0)
Lymphs Abs: 1 10*3/uL (ref 0.7–4.0)
MCH: 26.7 pg (ref 26.0–34.0)
MCH: 27.3 pg (ref 26.0–34.0)
MCHC: 33.3 g/dL (ref 30.0–36.0)
MCHC: 33.1 g/dL (ref 30.0–36.0)
MCV: 80 fL (ref 80.0–100.0)
MCV: 82.3 fL (ref 80.0–100.0)
Monocytes Absolute: 0.4 10*3/uL (ref 0.1–1.0)
Monocytes Absolute: 0.5 10*3/uL (ref 0.1–1.0)
Monocytes Relative: 2 %
Monocytes Relative: 3 %
NEUTROS ABS: 13.6 10*3/uL — AB (ref 1.7–7.7)
NRBC: 1.5 % — AB (ref 0.0–0.2)
Neutro Abs: 14.8 10*3/uL — ABNORMAL HIGH (ref 1.7–7.7)
Neutrophils Relative %: 81 %
Neutrophils Relative %: 81 %
Platelets: 88 10*3/uL — ABNORMAL LOW (ref 150–400)
Platelets: 90 10*3/uL — ABNORMAL LOW (ref 150–400)
RBC: 4.24 MIL/uL (ref 3.87–5.11)
RBC: 4.07 MIL/uL (ref 3.87–5.11)
RDW: 18 % — ABNORMAL HIGH (ref 11.5–15.5)
RDW: 18.2 % — ABNORMAL HIGH (ref 11.5–15.5)
WBC: 17 10*3/uL — ABNORMAL HIGH (ref 4.0–10.5)
WBC: 18.3 10*3/uL — AB (ref 4.0–10.5)
nRBC: 1.1 % — ABNORMAL HIGH (ref 0.0–0.2)

## 2018-09-29 LAB — DIC (DISSEMINATED INTRAVASCULAR COAGULATION)PANEL
D-Dimer, Quant: 3.03 ug/mL-FEU — ABNORMAL HIGH (ref 0.00–0.50)
Fibrinogen: 239 mg/dL (ref 210–475)
INR: 2.28
Platelets: 90 10*3/uL — ABNORMAL LOW (ref 150–400)
Smear Review: NONE SEEN
aPTT: 37 seconds — ABNORMAL HIGH (ref 24–36)

## 2018-09-29 LAB — DIC (DISSEMINATED INTRAVASCULAR COAGULATION) PANEL: PROTHROMBIN TIME: 24.8 s — AB (ref 11.4–15.2)

## 2018-09-29 MED ORDER — HYDROMORPHONE HCL 1 MG/ML IJ SOLN: 1.0000 mg | INTRAMUSCULAR | Status: DC | PRN

## 2018-09-29 MED ORDER — LORAZEPAM 2 MG/ML IJ SOLN: 1.0000 mg | INTRAMUSCULAR | Status: DC | PRN

## 2018-09-30 LAB — CULTURE, BLOOD (ROUTINE X 2)
Culture: NO GROWTH
Culture: NO GROWTH
Special Requests: ADEQUATE
Special Requests: ADEQUATE

## 2018-10-24 NOTE — Progress Notes (Signed)
Patient ID: Dominique [name], female   DOB: [birth year], 38 y.o.   MRN: [id number]  This NP visited patient at the bedside as a follow up to  yesterday's [place].,  Mother at bedside.  Patient is minimally responsive, worsening renal failure and appears to be transitioning at end of life.  Continued conversation with mother regarding current medical situation and poor prognosis.  Patient's mother/[name] verbalizes an understanding and her hope for her daughter now is comfort and dignity. Motional support offered.  Created space and opportunity for Dominique [name] to explore her feelings regarding the anticipatory death of her daughter  Plan of care: -DNR/DNI -No escalation of care -No further diagnostics, labs -No artificial hydration now or in the future -No antibiotic use -Symptom management to promote comfort  Prognosis is likely days.  Expect hospital death  Patient's mother expresses concern over postmortem care and options.  We discussed cremation as the likely desired intervention.  Together we called [facility] and she was able to get valuable information.  Palliative medicine will continue to support holistically  Questions and concerns addressed   Discussed with Dr Zigmund Daniel and bedside RN  Total time spent on the unit was 45 minutes  Greater than 50% of the time was spent in counseling and coordination of care  Wadie Lessen NP  Palliative Medicine Team Team Phone # [phone] Pager [phone]

## 2018-10-24 NOTE — Progress Notes (Signed)
OT Cancellation Note  Patient Details Name: Dominique [name] MRN: [id number] DOB: 01/06/1981   Cancelled Treatment:    Reason Eval/Treat Not Completed: Other (comment). Pt with decreased responsiveness. Will sign off at this time.  If pt's status changes and she will benefit, please reorder. Thanks.    [place], OTR/L Acute Rehabilitation Services [phone] [facility] pager [phone] office 10/03/2018 10/03/2018, 8:30 AM

## 2018-10-24 NOTE — Progress Notes (Signed)
Per pall care notes plan now is for transition to comfort care, no further labs or testing.  Will sign off.   Kelly Splinter MD pgr [phone]   Oct 28, 2018, 2:58 PM

## 2018-10-24 NOTE — Progress Notes (Signed)
Patient went went into asystole at 20:07. Cain Sieve, RN and Milagros Evener, RN both listened for two minutes independently. No heart tones detected. Patient family at bedside. On call physician notified.

## 2018-10-24 NOTE — Progress Notes (Signed)
Physical Therapy Discharge Patient Details Name: Reisa Coppola MRN: [id number] DOB: 1980/12/25 Today's Date: Oct 18, 2018 Time:  -     Patient discharged from PT services secondary to medical decline - will need to re-order PT to resume therapy services. Per Rn, unresponsive.  Please see latest therapy progress note for current level of functioning and progress toward goals.     GP     Claretha Cooper 10-18-2018, 9:24 AM [place] Pager [phone] Office [phone]

## 2018-10-24 NOTE — Progress Notes (Signed)
PROGRESS NOTE    Dominique [name]  DUK:[id number] DOB: 21-Oct-1980 DOA: 09/19/2018 PCP: Benito Mccreedy, MD   Brief Narrative:38 y.o.femalewithhistory of Crohn's disease status post colectomy with diverting ostomy was recently admitted for extended stay after patient was found to have hypotension abdominal wounds and possiblePOTSwas placed on midodrine and also with abdominal pain patient was placed on PPI and Pepcid had keratoplasty of the left cornea at [facility] and was transferred back to the hospital and discharge 10 days ago presents back to the ER because of increasing weakness over the last 24 hours feeling dizzy on standing and also having increasing abdominal pain mostly in the epigastric and left upper quadrant. Denies nausea vomiting or diarrhea. Has not lost consciousness or did not have any chest pain or shortness of breath.  ED Course:In the ER patient was found to be mildly hypotensive with elevated lactate and leukocytosis. CAT scan of the abdomen shows possibility of pancreatitis. Labs show leukocytosis. Creatinine has worsened from 0.7-1.1. Patient was given fluid bolus and pain relief medication admitted for further management of hypotension abdominal pain likely from pancreatitis. 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[place])   ARF (acute renal failure) (HCC)   Intractable vomiting   Dizzy   Other chronic pain   FTT (failure to thrive) in adult   Intertriginous dermatitis associated with moisture   SIRS (systemic inflammatory response syndrome) (HCC)   Septic shock (HCC)   Hypovolemic  shock (HCC)   Lactic acidosis   Abnormal serum creatinine level   Palliative care by specialist   Pain, generalized  #1 hypotension septic shock most likely intra-abdominal with CT findings of possible peritonitis versus hemorrhage with new fluid around the liver.elevated lactate.Patient's hemoglobin has been stable so is unlikely she has hemorrhage. However she is responding to IV hydration and IV antibiotics. She was treated with fluid resuscitation and Levophed. Monitor closely. An A-line was placed for better monitoring of her blood pressure. Patient has chronic hypotension and she is on midodrine and Florinef at home. Her mentation has been decreasing with decreased speech and not responding or following commands. Her white count has improved significantly. -Repeat CTtoday without contrast shows increase in size of the right pleural effusion small to moderate severe hepatic steatosis and moderate moderate ascites right-sided ostomy without obstruction edema in the patient's pannus medially and laterally. -Palliative care consulted. -Had long discussion with patient's mother and the RN.  Patient's mother has agreed for comfort care.  Patient has not made any progress overnight.  Her mentation has been decreasing and diminishing and she is now unresponsive even to pain.  Patient is full comfort care expect hospital death.  #2 AKI on CKD stage II-appreciate renal input. AKI secondary to contrast plus NSAIDs plus hypotension.Still with no significant urine output Ultrasound of the kidney right kidney unremarkable patient refused to do an ultrasound of the left kidney secondary to pain. Unfortunately her kidney functions are worsening  #3 multiple chronic wounds and skin damage along the intertriginous areas followwound care recommendations.  #4 left corneal perforation status post keratoplasty continue prednisone drops surgery  done at [facility] patient to follow-up with them in 2  weeks.        Nutrition Problem: Increased nutrient needs Etiology: wound healing     Signs/Symptoms: estimated needs    Interventions: MVI, Refer to RD note for recommendations  Estimated body mass index is 73.71 kg/m as calculated from the following:   Height as of this encounter: 5' 2"  (1.575 m).   Weight as of this encounter: 182.8 kg.  DVT prophylaxis: None patient is comfort care Code Status: DO NOT RESUSCITATE Family Communication: Discussed with mother Disposition Plan: Expecting hospital death   Consultants: Palliative care nephrology PCCM    Procedures: Right IJ Antimicrobials: None  Subjective:  unresponsive to pain mother by the bedside  Objective: Vitals:   07-Oct-2018 0900 07-Oct-2018 1000 10/07/18 1100 10/07/2018 1200  BP: 97/69 104/62 (!) 87/51 (!) 93/37  Pulse: (!) 130 (!) 134    Resp: 18 (!) 21 (!) 21 (!) 22  Temp:    98.4 F (36.9 C)  TempSrc:    Oral  SpO2: 97% 97%    Weight:      Height:        Intake/Output Summary (Last 24 hours) at 2018-10-07 1453 Last data filed at 07-Oct-2018 1200 Gross per 24 hour  Intake 543.84 ml  Output 75 ml  Net 468.84 ml   Filed Weights   09/26/18 0500 09/27/18 0500 October 07, 2018 0500  Weight: (!) 172.4 kg (!) 176 kg (!) 182.8 kg    Examination:  General exam: Lethargic and unresponsive Respiratory system: Clear to auscultation. Respiratory effort normal. Cardiovascular system: S1 & S2 heard, RRR. No JVD, murmurs, rubs, gallops or clicks. No pedal edema. Gastrointestinal system: Abdomen is nondistended, soft and nontender. No organomegaly or masses felt. Normal bowel sounds heard. Extremities: 2+ pitting edema Skin: No rashes, lesions or ulcers   Data Reviewed: I have personally reviewed following labs and imaging studies  CBC: Recent Labs  Lab 09/27/18 0620 09/28/18 0000 09/28/18 1200 07-Oct-2018 0000 October 07, 2018 0545 10-07-18 0551  WBC 18.2* 12.9* 14.9* 17.0*  --  18.3*  NEUTROABS 15.6* 10.9* 12.3*  13.6*  --  14.8*  HGB 11.0* 10.5* 11.1* 11.3*  --  11.1*  HCT 32.1* 31.1* 33.6* 33.9*  --  33.5*  MCV 77.7* 79.1* 80.4 80.0  --  82.3  PLT 137* 137* 97* 88* 90* 90*   Basic Metabolic Panel: Recent Labs  Lab 09/23/18 2052  09/24/18 0934  09/25/18 0837 09/25/18 1421 09/25/18 2100 09/26/18 0500 09/27/18 0500 09/28/18 0912 09/28/18 2100  NA 131*   < >  --    < > 134* 135  --  136 138 138  --   K 4.3   < >  --    < > 4.0 4.1  --  4.2 4.1 3.9  --   CL 108   < >  --    < > 111 112*  --  113* 117* 119*  --   CO2 16*   < >  --    < > 16* 15*  --  15* 13* 11*  --   GLUCOSE 133*   < >  --    < > 121* 115*  --  107* 97 98  --   BUN 16   < >  --    < > 25* 25*  --  29* 34* 43*  --   CREATININE 2.34*   < >  --    < > 2.46* 2.68*  --  2.94* 3.30* 4.08*  --   CALCIUM 7.4*   < >  --    < > 7.2* 7.3*  --  7.4* 7.3* 7.5*  --   MG 1.7  --  1.5*  --   --   --  2.3  --   --   --  2.3   < > = values in this interval not displayed.   GFR: Estimated Creatinine Clearance: 30.5 mL/min (A) (by C-G formula based on SCr of 4.08 mg/dL (H)). Liver Function Tests: Recent Labs  Lab 09/24/18 0301 09/25/18 1421  AST 19 12*  ALT 13 13  ALKPHOS 92 106  BILITOT 0.9 0.9  PROT 3.8* 4.6*  ALBUMIN 1.2* 1.5*   Recent Labs  Lab 09/24/18 0301  LIPASE 19   Recent Labs  Lab 09/27/18 1400  AMMONIA 58*   Coagulation Profile: Recent Labs  Lab 2018/09/30 0545  INR 2.28   Cardiac Enzymes: No results for input(s): CKTOTAL, CKMB, CKMBINDEX, TROPONINI in the last 168 hours. BNP (last 3 results) No results for input(s): PROBNP in the last 8760 hours. HbA1C: No results for input(s): HGBA1C in the last 72 hours. CBG: Recent Labs  Lab 09/24/18 2159 09/26/18 2005  GLUCAP 89 92   Lipid Profile: No results for input(s): CHOL, HDL, LDLCALC, TRIG, CHOLHDL, LDLDIRECT in the last 72 hours. Thyroid Function Tests: Recent Labs    09/27/18 1400  TSH 0.796   Anemia Panel: No results for input(s): VITAMINB12,  FOLATE, FERRITIN, TIBC, IRON, RETICCTPCT in the last 72 hours. Sepsis Labs: Recent Labs  Lab 09/24/18 0455 09/24/18 0934 09/24/18 1501 09/26/18 0500 09/26/18 0935 09/27/18 0500  PROCALCITON  --   --   --  1.42  --  1.47  LATICACIDVEN 3.1* 2.2* 1.4  --  1.2  --     Recent Results (from the past 240 hour(s))  Culture, Urine     Status: None   Collection Time: 09/24/18  1:29 AM  Result Value Ref Range Status   Specimen Description   Final    URINE, CATHETERIZED Performed at [facility], [place] [address] [place], [place] [address]    Special Requests   Final    NONE Performed at [facility], [place] [address], [place], [place] [address]    Culture   Final    NO GROWTH Performed at [facility], [place] [address] [place], [place] [address]    Report Status 09/25/2018 FINAL  Final  Aerobic/Anaerobic Culture (surgical/deep wound)     Status: None   Collection Time: 09/24/18  1:29 AM  Result Value Ref Range Status   Specimen Description   Final    WOUND ABDOMEN Performed at [place] [address] [place], [place] [address]    Special Requests   Final    NONE Performed at [facility], [place] [address], [place], [place] [address]    Gram Stain   Final    ABUNDANT WBC PRESENT, PREDOMINANTLY PMN RARE GRAM POSITIVE COCCI Performed at [facility], [place] [address] [place], [place] [address]    Culture   Final    FEW CORYNEBACTERIUM SPECIES RARE KLEBSIELLA PNEUMONIAE MIXED ANAEROBIC FLORA PRESENT.  CALL LAB IF FURTHER IID REQUIRED.    Report Status 09/28/2018 FINAL  Final   Organism ID, Bacteria KLEBSIELLA PNEUMONIAE  Final      Susceptibility   Klebsiella pneumoniae - MIC*    AMPICILLIN >=32 RESISTANT Resistant  CEFAZOLIN <=4 SENSITIVE Sensitive     CEFEPIME <=1 SENSITIVE Sensitive     CEFTAZIDIME <=1 SENSITIVE Sensitive     CEFTRIAXONE <=1 SENSITIVE Sensitive     CIPROFLOXACIN  <=0.25 SENSITIVE Sensitive     GENTAMICIN <=1 SENSITIVE Sensitive     IMIPENEM <=0.25 SENSITIVE Sensitive     TRIMETH/SULFA <=20 SENSITIVE Sensitive     AMPICILLIN/SULBACTAM 4 SENSITIVE Sensitive     PIP/TAZO <=4 SENSITIVE Sensitive     Extended ESBL NEGATIVE Sensitive     * RARE KLEBSIELLA PNEUMONIAE  MRSA PCR Screening     Status: None   Collection Time: 09/25/18  4:50 PM  Result Value Ref Range Status   MRSA by PCR NEGATIVE NEGATIVE Final    Comment:        The GeneXpert MRSA Assay (FDA approved for NASAL specimens only), is one component of a comprehensive MRSA colonization surveillance program. It is not intended to diagnose MRSA infection nor to guide or monitor treatment for MRSA infections. Performed at [facility], [place] [address] [place], [place] [address]   Culture, blood (routine x 2)     Status: None (Preliminary result)   Collection Time: 09/25/18  5:20 PM  Result Value Ref Range Status   Specimen Description   Final    BLOOD CENTRAL LINE Performed at [facility], [place] [address] [place], [place] [address]    Special Requests   Final    BOTTLES DRAWN AEROBIC AND ANAEROBIC Blood Culture adequate volume Performed at [place] [address], [place], [place] [address]    Culture   Final    NO GROWTH 4 DAYS Performed at [facility], [place] [address], [place], [place] [address]    Report Status PENDING  Incomplete  Culture, blood (routine x 2)     Status: None (Preliminary result)   Collection Time: 09/25/18  5:21 PM  Result Value Ref Range Status   Specimen Description   Final    BLOOD CENTRAL LINE Performed at [facility], [place] [address], [place], [place] [address]    Special Requests   Final    BOTTLES DRAWN AEROBIC AND ANAEROBIC Blood Culture adequate volume Performed at [place] [address], [place], [place] [address]    Culture   Final      NO GROWTH 4 DAYS Performed at [facility], [place] [address], [place], [place] [address]    Report Status PENDING  Incomplete         Radiology Studies: Ct Abdomen Pelvis Wo Contrast  Result Date: 09/27/2018 CLINICAL DATA:  Crohn's disease with diverting ostomy. Pancreatitis. Leukocytosis and fever. Abdominal pain. EXAM: CT ABDOMEN AND PELVIS WITHOUT CONTRAST TECHNIQUE: Multidetector CT imaging of the abdomen and pelvis was performed following the standard protocol without IV contrast. COMPARISON:  09/24/2016 FINDINGS: The patient was scanned with her arms by her sides. In conjunction with her body habitus and the lack of IV contrast, this significantly reduces diagnostic sensitivity and specificity. Lower chest: Increase in size of the right pleural effusion, currently small to moderate. New mild lingular airspace opacity on image 1/4. Mild cardiomegaly. Hepatobiliary: Severe hepatic steatosis. This causes the adjacent fluid density ascites to be higher in density than the hepatic parenchyma. Poor visualization of the gallbladder against the background of the ascites. Pancreas: Unremarkable noncontrast CT appearance. The previous pancreatic tail lesion shown on contrast enhanced exams is not conspicuous on noncontrast exam.  Spleen: Unremarkable Adrenals/Urinary Tract: No hydronephrosis Foley catheter in the urinary bladder. No appreciable urinary tract calculi. Stomach/Bowel: Right-sided ostomy with peristomal hernia containing small bowel without findings of strangulation or obstruction on noncontrast images. Rectosigmoid pouch noted, blind-ending in the left lower quadrant. Vascular/Lymphatic: Unremarkable Reproductive: Unremarkable Other: Moderate ascites. Musculoskeletal: Edema in the patient's pannus medially and laterally. Nodularity along the left pedicular subcutaneous tissues measuring 1.6 by 1.1 cm on image 76/2, possibly a lymph node or injection site. Generalized muscular atrophy.  IMPRESSION: 1. Increase in size of the right pleural effusion, currently small to moderate. 2. Severe hepatic steatosis. 3. Moderate ascites. This measures fluid density, although is denser than the severely fatty infiltrated liver. 4. Right-sided ostomy with peristomal hernia containing small bowel without findings of strangulation or obstruction. 5. Edema in the patient's pannus medially and laterally, low-grade panniculitis not excluded. 6. Nodularity along the left pedicular subcutaneous tissues, possibly a lymph node or injection site. Electronically Signed   By: Van Clines M.D.   On: 09/27/2018 15:57        Scheduled Meds: . Chlorhexidine Gluconate Cloth  6 each Topical Daily  . mouth rinse  15 mL Mouth Rinse BID  . prednisoLONE acetate  1 drop Left Eye Q6H  . sodium chloride flush  10-40 mL Intracatheter Q12H   Continuous Infusions: . sodium chloride 10 mL/hr at 2018-10-02 1200     LOS: 13 days     Georgette Shell, MD [facility]  If 7PM-7AM, please contact night-coverage [email or web address] Password [facility] October 02, 2018, 2:53 PM

## 2018-10-24 NOTE — Death Summary Note (Signed)
Death Summary  Dominique [name] NOM:[id number] DOB: 1981/08/10 DOA: Sep 29, 2018  PCP: Benito Mccreedy, MD  Admit date: September 29, 2018 Date of Death: Oct 15, 2018 Time of Death: 12/01/05 Notification: Benito Mccreedy, MD notified of death of 2018-10-15   History of present illness:  Dominique [name] is a 38 y.o. female with a history of chronic extensive abdominal wounds, port syndrome, Crohn's disease colectomy pancreatitis Dominique [name] presented with complaint of abdominal pain found to have pancreatitis and acute renal failure with chronic kidney disease. Dominique [name] did not improve after IV fluids IV antibiotics and pressors.  Patient's CODE STATUS was changed to DO NOT RESUSCITATE and comfort care palliative care was consulted and the mother agreed for comfort care. Final Diagnoses:  1.  Acute renal failure 2 sepsis   The results of significant diagnostics from this hospitalization (including imaging, microbiology, ancillary and laboratory) are listed below for reference.    Significant Diagnostic Studies: Ct Abdomen Pelvis Wo Contrast  Result Date: 09/27/2018 CLINICAL DATA:  Crohn's disease with diverting ostomy. Pancreatitis. Leukocytosis and fever. Abdominal pain. EXAM: CT ABDOMEN AND PELVIS WITHOUT CONTRAST TECHNIQUE: Multidetector CT imaging of the abdomen and pelvis was performed following the standard protocol without IV contrast. COMPARISON:  09/24/2016 FINDINGS: The patient was scanned with her arms by her sides. In conjunction with her body habitus and the lack of IV contrast, this significantly reduces diagnostic sensitivity and specificity. Lower chest: Increase in size of the right pleural effusion, currently small to moderate. New mild lingular airspace opacity on image 1/4. Mild cardiomegaly. Hepatobiliary: Severe hepatic steatosis. This causes the adjacent fluid density ascites to be higher in density than the hepatic parenchyma. Poor visualization of the gallbladder against the background  of the ascites. Pancreas: Unremarkable noncontrast CT appearance. The previous pancreatic tail lesion shown on contrast enhanced exams is not conspicuous on noncontrast exam. Spleen: Unremarkable Adrenals/Urinary Tract: No hydronephrosis Foley catheter in the urinary bladder. No appreciable urinary tract calculi. Stomach/Bowel: Right-sided ostomy with peristomal hernia containing small bowel without findings of strangulation or obstruction on noncontrast images. Rectosigmoid pouch noted, blind-ending in the left lower quadrant. Vascular/Lymphatic: Unremarkable Reproductive: Unremarkable Other: Moderate ascites. Musculoskeletal: Edema in the patient's pannus medially and laterally. Nodularity along the left pedicular subcutaneous tissues measuring 1.6 by 1.1 cm on image 76/2, possibly a lymph node or injection site. Generalized muscular atrophy. IMPRESSION: 1. Increase in size of the right pleural effusion, currently small to moderate. 2. Severe hepatic steatosis. 3. Moderate ascites. This measures fluid density, although is denser than the severely fatty infiltrated liver. 4. Right-sided ostomy with peristomal hernia containing small bowel without findings of strangulation or obstruction. 5. Edema in the patient's pannus medially and laterally, low-grade panniculitis not excluded. 6. Nodularity along the left pedicular subcutaneous tissues, possibly a lymph node or injection site. Electronically Signed   By: Van Clines M.D.   On: 09/27/2018 15:57   Ct Abdomen Pelvis Wo Contrast  Result Date: 09/24/2018 CLINICAL DATA:  Acute onset of generalized weakness and abdominal pain. Question of recent pancreatitis. Leukocytosis. Acute renal failure. EXAM: CT ABDOMEN AND PELVIS WITHOUT CONTRAST TECHNIQUE: Multidetector CT imaging of the abdomen and pelvis was performed following the standard protocol without IV contrast. COMPARISON:  CT of the abdomen and pelvis from 10/18/2018 FINDINGS: Lower chest: Trace  right-sided pleural fluid is noted, with associated atelectasis. The visualized portions of the mediastinum are unremarkable. Hepatobiliary: The liver is grossly unremarkable in appearance. The gallbladder is not well characterized due to surrounding ascites. The common bile duct is  normal in caliber. New ascites about the liver is higher in attenuation than the liver itself, raising suspicion for hemorrhage or complex fluid. Pancreas: The previously noted cystic focus at the pancreatic tail is less well seen on the current study. On correlation with lab results, the patient's lipase is within normal limits, without definite evidence for pancreatitis. Spleen: The spleen is unremarkable in appearance. Adrenals/Urinary Tract: The adrenal glands are unremarkable in appearance. The kidneys are within normal limits. There is no evidence of hydronephrosis. No renal or ureteral stones are seen. No perinephric stranding is appreciated. Stomach/Bowel: The patient is status post resection of much of the colon. Relatively high attenuation fluid is noted tracking about the bowel. Visualized small bowel loops are largely decompressed and unremarkable in appearance. A right lower quadrant ileostomy is grossly unremarkable in appearance, with herniation of the distal small bowel into the hernia. There is no evidence of bowel obstruction. The patient's Hartmann's pouch is grossly unremarkable in appearance. Vascular/Lymphatic: The abdominal aorta is unremarkable in appearance. The inferior vena cava is grossly unremarkable. No retroperitoneal lymphadenopathy is seen. No pelvic sidewall lymphadenopathy is identified. Reproductive: The bladder is decompressed, with a Foley catheter in place. The uterus is grossly unremarkable in appearance. No suspicious adnexal masses are seen. Other: No additional soft tissue abnormalities are seen. Musculoskeletal: No acute osseous abnormalities are identified. The visualized musculature is  unremarkable in appearance. IMPRESSION: 1. New ascites about the liver is higher in attenuation than the liver itself, raising suspicion for hemorrhage or complex fluid. The patient's hematocrit has not significantly changed recently; given the patient's symptoms and severe leukocytosis, would correlate clinically for evidence of peritonitis. 2. Previously noted cystic focus at the pancreatic tail is less well seen on the current study. On correlation with lab results, the patient's lipase is within normal limits, without definite evidence for pancreatitis. 3. Right lower quadrant ileostomy is grossly unremarkable in appearance, with herniation of the distal small bowel into the hernia. No evidence of bowel obstruction. 4. Trace right-sided pleural fluid, with associated atelectasis. These results were called by telephone at the time of interpretation on 09/24/2018 at 4:39 am to [facility] at [facility] ICU, who verbally acknowledged these results. Electronically Signed   By: Garald Balding M.D.   On: 09/24/2018 04:43   Ct Head Wo Contrast  Result Date: 09/27/2018 CLINICAL DATA:  Altered level of consciousness (LOC), unexplained EXAM: CT HEAD WITHOUT CONTRAST TECHNIQUE: Contiguous axial images were obtained from the base of the skull through the vertex without intravenous contrast. COMPARISON:  Head CT 12/16/2008 FINDINGS: Brain: Mild generalized cerebral and cerebellar atrophy, age advanced. No intracranial hemorrhage, mass effect, or midline shift. No hydrocephalus. The basilar cisterns are patent. No evidence of territorial infarct or acute ischemia. Diffuse chronic dural calcifications. No extra-axial or intracranial fluid collection. Vascular: No hyperdense vessel. Skull: No fracture or focal lesion. Sinuses/Orbits: Paranasal sinuses and mastoid air cells are clear. The visualized orbits are unremarkable. Other: None. IMPRESSION: 1. No acute intracranial abnormality. 2. Mild generalized atrophy, advanced for  age. Electronically Signed   By: Keith Rake M.D.   On: 09/27/2018 02:17   Ct Abdomen Pelvis W Contrast  Result Date: 08/31/2018 CLINICAL DATA:  Weakness, hypotension, evaluate acute worsening of chronic wounds, ?deep space infection^155m ISOVUE-300 IOPAMIDOL (ISOVUE-300) INJECTION 61%Cannot find appropriate structured reason for exam - see REASON FOR EXAM (FREE TEXT) hypotension, evaluate acute worsening of chronic wounds, ?deep space infection EXAM: CT ABDOMEN AND PELVIS WITH CONTRAST TECHNIQUE: Multidetector CT  imaging of the abdomen and pelvis was performed using the standard protocol following bolus administration of intravenous contrast. CONTRAST:  143m ISOVUE-300 IOPAMIDOL (ISOVUE-300) INJECTION 61% COMPARISON:  CT 979 FINDINGS: Lower chest: Lung bases are clear. Hepatobiliary: No focal hepatic lesion. No biliary duct dilatation. Gallbladder is normal. Common bile duct is normal. Pancreas: Within the tail of the pancreas, new fluid collection measuring 2.4 x 1.5 cm (image 23/2). There is mild peripancreatic fat inflammation in the splenic hilar region. The more distal body and head of the pancreas appear normal. No duct dilatation. Spleen: Normal spleen.  Normal splenic vein. Adrenals/urinary tract: Adrenal glands and kidneys are normal. The ureters and bladder normal. Stomach/Bowel: Stomach, duodenum small-bowel normal. RIGHT lower quadrant ileostomy. Patient status post colectomy. 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 Vascular/Lymphatic: Abdominal aorta is normal caliber. No periportal or retroperitoneal adenopathy. No pelvic adenopathy. Reproductive: Uterus and ovaries normal Other: No deep wound infection identified. Musculoskeletal: No aggressive osseous lesion. IMPRESSION: 1. New small fluid collection in the tail the pancreas is concern for focal pancreatitis of the tail the pancreas. No extra pancreatic organized fluid collection. Minimal peripancreatic fat  stranding. 2. 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 3. No evidence of deep wound infection. Electronically Signed   By: SSuzy BouchardM.D.   On: 09/01/2018 19:47   UKoreaRenal  Result Date: 09/25/2018 CLINICAL DATA:  Abnormal serum creatinine.  Renal insufficiency. EXAM: RENAL / URINARY TRACT ULTRASOUND COMPLETE COMPARISON:  CT 09/24/2018 and 08/27/2018 FINDINGS: Right Kidney: Renal measurements: 4.7 x 7.3 x 10.4 cm = volume: 186 mL . Echogenicity within normal limits. No mass or hydronephrosis visualized. Left Kidney: Not visualized. Bladder: Not visualized. Note the patient declined continued scanning after evaluating the right kidney as patient unable to tolerate exam. Incidentally noted was ascites over the right upper quadrant. IMPRESSION: Unremarkable right kidney. Left kidney and bladder not visualized as patient declined further scanning after evaluating the right kidney. Mild ascites. Electronically Signed   By: DMarin OlpM.D.   On: 09/25/2018 18:13   UKoreaAbdomen Limited  Result Date: 09/01/2018 CLINICAL DATA:  Abdominal pain EXAM: ULTRASOUND ABDOMEN LIMITED RIGHT UPPER QUADRANT COMPARISON:  CT 05/30/2018, ultrasound 04/25/2015 FINDINGS: Gallbladder: Possible tumefactive sludge or small polyp in the gallbladder. No shadowing stone. Normal wall thickness. Negative sonographic Murphy. Common bile duct: Diameter: 3.2 mm Liver: Increased hepatic echogenicity without focal abnormality. Portal vein is patent on color Doppler imaging with normal direction of blood flow towards the liver. IMPRESSION: 1. Limited exam secondary to habitus and patient inability to position for the exam 2. Small echogenic focus in the gallbladder, probably represents polyp or small amount of tumefactive sludge. Negative for shadowing stone or biliary dilatation 3. Echogenic liver suggesting steatosis Electronically Signed   By: KDonavan FoilM.D.   On: 09/01/2018 19:37   Dg  Chest Port 1 View  Result Date: 09/27/2018 CLINICAL DATA:  Evaluate for pulmonary edema. EXAM: PORTABLE CHEST 1 VIEW COMPARISON:  09/26/2018 FINDINGS: Right IJ catheter projects over the cavoatrial junction. Cardiac enlargement. Decrease in pulmonary edema. Unchanged asymmetric airspace opacification of the right hemidiaphragm. IMPRESSION: 1. Interval decrease in pulmonary edema. Electronically Signed   By: TKerby MoorsM.D.   On: 09/27/2018 09:27   Dg Chest Port 1 View  Result Date: 09/26/2018 CLINICAL DATA:  Initial evaluation for acute respiratory failure. EXAM: PORTABLE CHEST 1 VIEW COMPARISON:  Prior radiograph from 09/24/2018. FINDINGS: Right IJ approach central venous  catheter remains in place with tip overlying the cavoatrial junction. Stable cardiomegaly. Mediastinal silhouette unchanged, and remains within normal limits. Lungs are hypoinflated with elevation of the right hemidiaphragm. Associated right basilar bronchovascular crowding/atelectasis, similar to previous. Mild perihilar vascular congestion without overt pulmonary edema, relatively unchanged. No appreciable pleural effusion. No new focal infiltrates. No pneumothorax. Osseous structures unchanged. IMPRESSION: 1. No significant interval change in appearance of the chest. Low lung volumes with associated right basilar atelectasis/bronchovascular crowding. 2. Stable cardiomegaly with mild perihilar vascular congestion without overt pulmonary edema. Electronically Signed   By: Jeannine Boga M.D.   On: 09/26/2018 06:09   Dg Chest Port 1 View  Result Date: 09/24/2018 CLINICAL DATA:  Central line placement EXAM: PORTABLE CHEST 1 VIEW COMPARISON:  Chest radiograph from earlier today. FINDINGS: Right internal jugular central venous catheter terminates at the cavoatrial junction. Stable cardiomediastinal silhouette with mild cardiomegaly. No pneumothorax. No pleural effusion. Low lung volumes. No overt pulmonary edema. No acute  consolidative airspace disease. IMPRESSION: 1. Right internal jugular central venous catheter terminates at the cavoatrial junction. No pneumothorax. 2. Low lung volumes. Stable mild cardiomegaly without overt pulmonary edema. Electronically Signed   By: Ilona Sorrel M.D.   On: 09/24/2018 14:09   Dg Chest Port 1 View  Result Date: 09/24/2018 CLINICAL DATA:  Dyspnea and leukocytosis EXAM: PORTABLE CHEST 1 VIEW COMPARISON:  09/08/2018 chest radiograph. FINDINGS: Stable leftward displacement of the trachea at the thoracic inlet. Stable cardiomediastinal silhouette with mild cardiomegaly. No pneumothorax. No pleural effusion. Lungs appear clear, with no acute consolidative airspace disease and no pulmonary edema. IMPRESSION: Stable mild cardiomegaly without pulmonary edema. No active pulmonary disease. Stable leftward displacement of the trachea at the thoracic inlet suggesting enlarged right thyroid lobe. Electronically Signed   By: Ilona Sorrel M.D.   On: 09/24/2018 02:01   Dg Chest Port 1 View  Result Date: 09/10/2018 CLINICAL DATA:  Dizziness EXAM: PORTABLE CHEST 1 VIEW COMPARISON:  05/30/2018 FINDINGS: Rightward deviation of the trachea in the neck, looking back to prior chest CT from 06/09/2015 there with was nodularity and cystic lesions in the right thyroid lobe causing this deviation. This could be worked up with thyroid ultrasound if clinically warranted. The lungs appear clear. Cardiac and mediastinal margins appear normal. No appreciable pleural effusion. IMPRESSION: 1. Leftward deviation of the trachea related to right thyroid enlargement as shown on prior CT scan from 2016. If clinically warranted this could be worked up dedicated thyroid ultrasound. Otherwise, no significant abnormalities are observed. Electronically Signed   By: Van Clines M.D.   On: 09/01/2018 15:40   Dg Abd 2 Views  Result Date: 09/02/2018 CLINICAL DATA:  Abdominal pain.  History of Crohn's disease. EXAM: ABDOMEN -  2 VIEW COMPARISON:  CT abdomen and pelvis May 30, 2018 FINDINGS: Supine and upright images obtained. There is no appreciable bowel dilatation or air-fluid level to suggest bowel obstruction. No free air. Fairly mild degree of stool in colon. Lung bases clear. There is a small apparent phlebolith in the right abdomen. IMPRESSION: No evident bowel obstruction or free air.  Lung bases clear. Electronically Signed   By: Lowella Grip III M.D.   On: 09/02/2018 13:26    Microbiology: Recent Results (from the past 240 hour(s))  Culture, Urine     Status: None   Collection Time: 09/24/18  1:29 AM  Result Value Ref Range Status   Specimen Description   Final    URINE, CATHETERIZED Performed at [facility],  [place] [address] [place], [place] [address]    Special Requests   Final    NONE Performed at [facility], [place] [address] [place], [place] [address]    Culture   Final    NO GROWTH Performed at [facility], [place] [address] [place], [place] [address]    Report Status 09/25/2018 FINAL  Final  Aerobic/Anaerobic Culture (surgical/deep wound)     Status: None   Collection Time: 09/24/18  1:29 AM  Result Value Ref Range Status   Specimen Description   Final    WOUND ABDOMEN Performed at [place] [address] [place], [place] [address]    Special Requests   Final    NONE Performed at [facility] [place] [address], [place], [place] [address]    Gram Stain   Final    ABUNDANT WBC PRESENT, PREDOMINANTLY PMN RARE GRAM POSITIVE COCCI Performed at [facility], [place] [address], [place], [place] [address]    Culture   Final    FEW CORYNEBACTERIUM SPECIES RARE KLEBSIELLA PNEUMONIAE MIXED ANAEROBIC FLORA PRESENT.  CALL LAB IF FURTHER IID REQUIRED.    Report Status 09/28/2018 FINAL  Final   Organism ID, Bacteria KLEBSIELLA PNEUMONIAE  Final      Susceptibility   Klebsiella pneumoniae - MIC*      AMPICILLIN >=32 RESISTANT Resistant     CEFAZOLIN <=4 SENSITIVE Sensitive     CEFEPIME <=1 SENSITIVE Sensitive     CEFTAZIDIME <=1 SENSITIVE Sensitive     CEFTRIAXONE <=1 SENSITIVE Sensitive     CIPROFLOXACIN <=0.25 SENSITIVE Sensitive     GENTAMICIN <=1 SENSITIVE Sensitive     IMIPENEM <=0.25 SENSITIVE Sensitive     TRIMETH/SULFA <=20 SENSITIVE Sensitive     AMPICILLIN/SULBACTAM 4 SENSITIVE Sensitive     PIP/TAZO <=4 SENSITIVE Sensitive     Extended ESBL NEGATIVE Sensitive     * RARE KLEBSIELLA PNEUMONIAE  MRSA PCR Screening     Status: None   Collection Time: 09/25/18  4:50 PM  Result Value Ref Range Status   MRSA by PCR NEGATIVE NEGATIVE Final    Comment:        The GeneXpert MRSA Assay (FDA approved for NASAL specimens only), is one component of a comprehensive MRSA colonization surveillance program. It is not intended to diagnose MRSA infection nor to guide or monitor treatment for MRSA infections. Performed at [facility], [place] [address] [place], [place] [address]   Culture, blood (routine x 2)     Status: None   Collection Time: 09/25/18  5:20 PM  Result Value Ref Range Status   Specimen Description   Final    BLOOD CENTRAL LINE Performed at [facility], [place] [address], [place], [place] [address]    Special Requests   Final    BOTTLES DRAWN AEROBIC AND ANAEROBIC Blood Culture adequate volume Performed at [place] [address] [place], [place] [address]    Culture   Final    NO GROWTH 5 DAYS Performed at [facility], [place] [address] [place],  [address]    Report Status 09/30/2018 FINAL  Final  Culture, blood (routine x 2)     Status: None   Collection Time: 09/25/18  5:21 PM  Result Value Ref Range Status   Specimen Description   Final    BLOOD CENTRAL LINE Performed at [facility], [place] [address]  [place], [place] [address]    Special Requests   Final     BOTTLES DRAWN AEROBIC AND ANAEROBIC Blood Culture adequate volume Performed at [place] [address], [place], [place] [address]    Culture   Final    NO GROWTH 5 DAYS Performed at [facility], [place] [address] [place], [place] [address]    Report Status 09/30/2018 FINAL  Final     Labs: Basic Metabolic Panel: Recent Labs  Lab 09/25/18 0837 09/25/18 1421 09/25/18 2100 09/26/18 0500 09/27/18 0500 09/28/18 0912 09/28/18 2100  NA 134* 135  --  136 138 138  --   K 4.0 4.1  --  4.2 4.1 3.9  --   CL 111 112*  --  113* 117* 119*  --   CO2 16* 15*  --  15* 13* 11*  --   GLUCOSE 121* 115*  --  107* 97 98  --   BUN 25* 25*  --  29* 34* 43*  --   CREATININE 2.46* 2.68*  --  2.94* 3.30* 4.08*  --   CALCIUM 7.2* 7.3*  --  7.4* 7.3* 7.5*  --   MG  --   --  2.3  --   --   --  2.3   Liver Function Tests: Recent Labs  Lab 09/25/18 1421  AST 12*  ALT 13  ALKPHOS 106  BILITOT 0.9  PROT 4.6*  ALBUMIN 1.5*   No results for input(s): LIPASE, AMYLASE in the last 168 hours. Recent Labs  Lab 09/27/18 1400  AMMONIA 58*   CBC: Recent Labs  Lab 09/27/18 0620 09/28/18 0000 09/28/18 1200 October 15, 2018 0000 October 15, 2018 0545 10-15-18 0551  WBC 18.2* 12.9* 14.9* 17.0*  --  18.3*  NEUTROABS 15.6* 10.9* 12.3* 13.6*  --  14.8*  HGB 11.0* 10.5* 11.1* 11.3*  --  11.1*  HCT 32.1* 31.1* 33.6* 33.9*  --  33.5*  MCV 77.7* 79.1* 80.4 80.0  --  82.3  PLT 137* 137* 97* 88* 90* 90*   Cardiac Enzymes: No results for input(s): CKTOTAL, CKMB, CKMBINDEX, TROPONINI in the last 168 hours. D-Dimer Recent Labs    2018-10-15 0545  DDIMER 3.03*   BNP: Invalid input(s): POCBNP CBG: Recent Labs  Lab 09/24/18 2159 09/26/18 2005  GLUCAP 89 92   Anemia work up No results for input(s): VITAMINB12, FOLATE, FERRITIN, TIBC, IRON, RETICCTPCT in the last 72 hours. Urinalysis    Component Value Date/Time   COLORURINE AMBER (A) 09/24/2018 0129   APPEARANCEUR CLOUDY (A)  09/24/2018 0129   LABSPEC 1.017 09/24/2018 0129   PHURINE 5.0 09/24/2018 0129   GLUCOSEU NEGATIVE 09/24/2018 0129   HGBUR MODERATE (A) 09/24/2018 0129   BILIRUBINUR NEGATIVE 09/24/2018 0129   KETONESUR NEGATIVE 09/24/2018 0129   PROTEINUR 30 (A) 09/24/2018 0129   UROBILINOGEN 0.2 07/22/2015 0405   NITRITE NEGATIVE 09/24/2018 0129   LEUKOCYTESUR SMALL (A) 09/24/2018 0129   Sepsis Labs Invalid input(s): PROCALCITONIN,  WBC,  LACTICIDVEN     SIGNED:  Georgette Shell, MD  [facility] 10/01/2018, 3:31 PM Pager   If 7PM-7AM, please contact night-coverage [email or web address] Password [email or web address]

## 2018-10-24 DEATH — deceased
